# Patient Record
Sex: Female | Born: 1945 | Race: White | Hispanic: No | Smoking: Former smoker
Health system: Southern US, Community
[De-identification: ages and names within clinical notes are randomized; demographics above are authoritative.]

## PROBLEM LIST (undated history)

## (undated) DIAGNOSIS — M1990 Unspecified osteoarthritis, unspecified site: Secondary | ICD-10-CM

## (undated) DIAGNOSIS — E039 Hypothyroidism, unspecified: Secondary | ICD-10-CM

## (undated) DIAGNOSIS — C50919 Malignant neoplasm of unspecified site of unspecified female breast: Secondary | ICD-10-CM

## (undated) DIAGNOSIS — I10 Essential (primary) hypertension: Secondary | ICD-10-CM

## (undated) DIAGNOSIS — E7800 Pure hypercholesterolemia, unspecified: Secondary | ICD-10-CM

## (undated) DIAGNOSIS — C801 Malignant (primary) neoplasm, unspecified: Secondary | ICD-10-CM

## (undated) DIAGNOSIS — E119 Type 2 diabetes mellitus without complications: Secondary | ICD-10-CM

## (undated) DIAGNOSIS — J302 Other seasonal allergic rhinitis: Secondary | ICD-10-CM

## (undated) HISTORY — PX: ABDOMINAL HYSTERECTOMY: SHX81

## (undated) HISTORY — PX: CHOLECYSTECTOMY: SHX55

## (undated) HISTORY — PX: BREAST BIOPSY: SHX20

## (undated) HISTORY — PX: KIDNEY STONE SURGERY: SHX686

---

## 2002-05-20 DIAGNOSIS — C801 Malignant (primary) neoplasm, unspecified: Secondary | ICD-10-CM

## 2002-05-20 HISTORY — DX: Malignant (primary) neoplasm, unspecified: C80.1

## 2002-05-20 HISTORY — PX: BREAST LUMPECTOMY: SHX2

## 2002-05-20 HISTORY — PX: BREAST EXCISIONAL BIOPSY: SUR124

## 2005-06-04 ENCOUNTER — Inpatient Hospital Stay: Payer: Self-pay | Admitting: Psychiatry

## 2005-06-05 ENCOUNTER — Other Ambulatory Visit: Payer: Self-pay

## 2005-06-11 ENCOUNTER — Other Ambulatory Visit: Payer: Self-pay

## 2005-12-02 ENCOUNTER — Emergency Department: Payer: Self-pay | Admitting: Unknown Physician Specialty

## 2006-05-27 ENCOUNTER — Inpatient Hospital Stay: Payer: Self-pay | Admitting: Psychiatry

## 2009-11-23 ENCOUNTER — Inpatient Hospital Stay: Payer: Self-pay | Admitting: Psychiatry

## 2011-06-07 ENCOUNTER — Emergency Department: Payer: Self-pay | Admitting: Emergency Medicine

## 2011-06-07 LAB — CBC WITH DIFFERENTIAL/PLATELET
Basophil #: 0.1 10*3/uL (ref 0.0–0.1)
Eosinophil #: 0.3 10*3/uL (ref 0.0–0.7)
Lymphocyte #: 4 10*3/uL — ABNORMAL HIGH (ref 1.0–3.6)
MCHC: 33.4 g/dL (ref 32.0–36.0)
MCV: 88 fL (ref 80–100)
Monocyte %: 6.9 %
Neutrophil #: 5.9 10*3/uL (ref 1.4–6.5)
Neutrophil %: 53.6 %
Platelet: 304 10*3/uL (ref 150–440)
RBC: 5.01 10*6/uL (ref 3.80–5.20)
RDW: 13 % (ref 11.5–14.5)
WBC: 11 10*3/uL (ref 3.6–11.0)

## 2011-06-07 LAB — BASIC METABOLIC PANEL
Anion Gap: 11 (ref 7–16)
Chloride: 102 mmol/L (ref 98–107)
Co2: 29 mmol/L (ref 21–32)
Creatinine: 1.26 mg/dL (ref 0.60–1.30)
Potassium: 4 mmol/L (ref 3.5–5.1)
Sodium: 142 mmol/L (ref 136–145)

## 2011-06-07 IMAGING — CR DG CHEST 1V PORT
1 series · 1 of 1 positions shown · non-contrast
Comparison: none

REASON FOR EXAM: weak
COMMENTS:

PROCEDURE:     DXR - DXR PORTABLE CHEST SINGLE VIEW  - [DATE]  [DATE]
RESULT:
The lungs are clear. The cardiovascular structures are unremarkable.
Surgical clips are noted over the right lower chest.

[x chest ap]
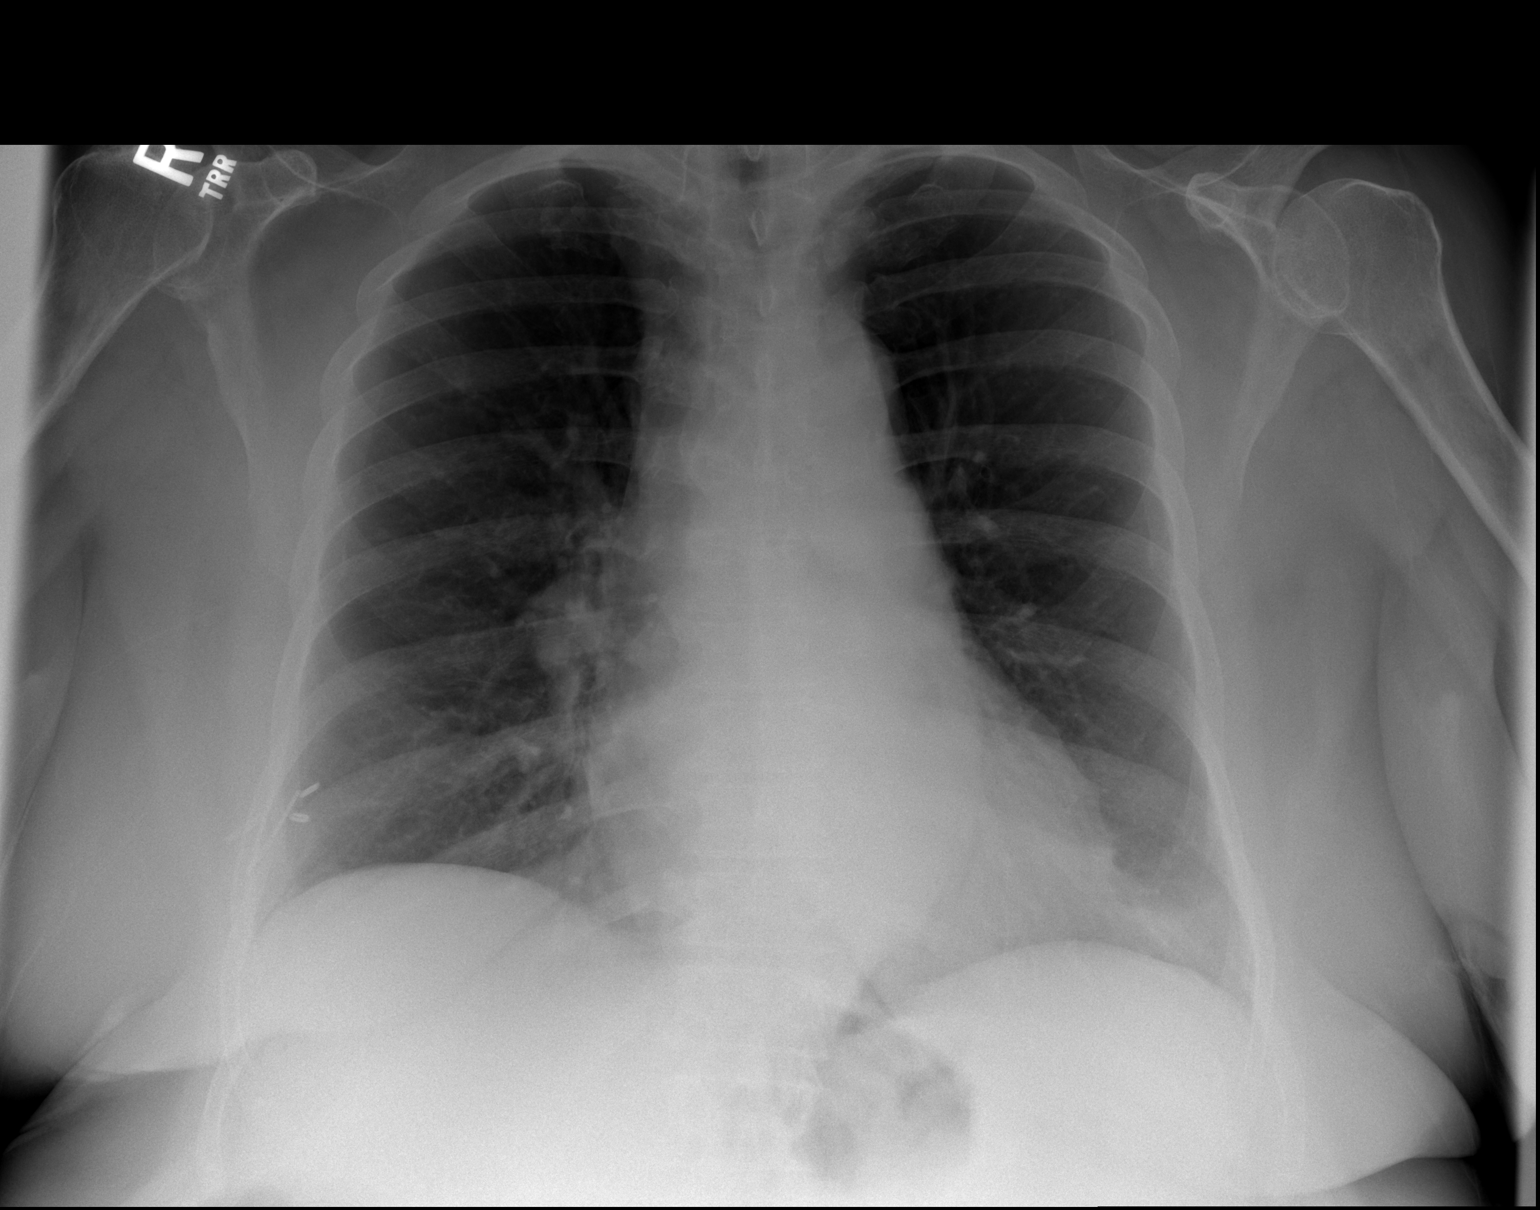

[1 of 1 positions shown; findings below may reference images not displayed]

IMPRESSION: No acute abnormality. Surgical clips are noted over the
right lower chest.

## 2011-06-07 IMAGING — CT CT HEAD WITHOUT CONTRAST
1 series · 16 of 30 positions shown, 20 images · non-contrast
Comparison: none

REASON FOR EXAM: vertigo
COMMENTS:

PROCEDURE:     CT  - CT HEAD WITHOUT CONTRAST  - [DATE] [DATE]
RESULT:
HISTORY: Vertigo.
COMPARISON STUDIES:  None.
PROCEDURE:  Standard nonenhanced CT obtained. No mass lesion. No
hydrocephalus. No hemorrhage. The basilar artery is slightly hyperdense. To
evaluate for basilar artery thrombosis, CTA or MRA should be considered. No
acute bony abnormality.

[Series 2: soft tissue · axial · 0.41mm/px · z∈[+383,+518]mm · 16 of 30 slices shown, 20 images]
[im 2/30  brain]
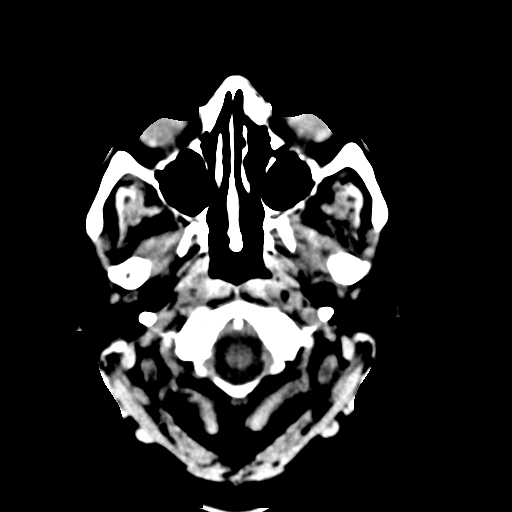
[im 2/30  bone]
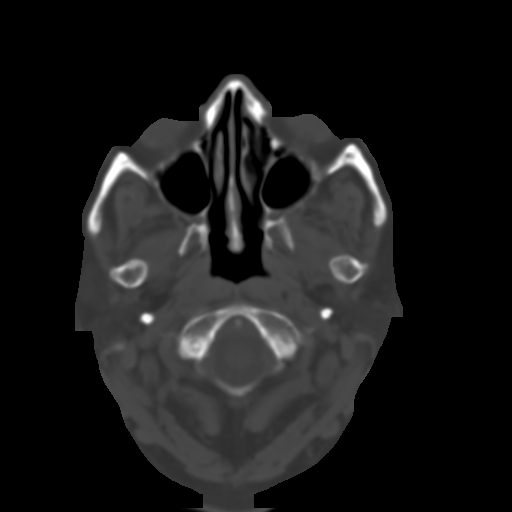
[im 4/30  brain]
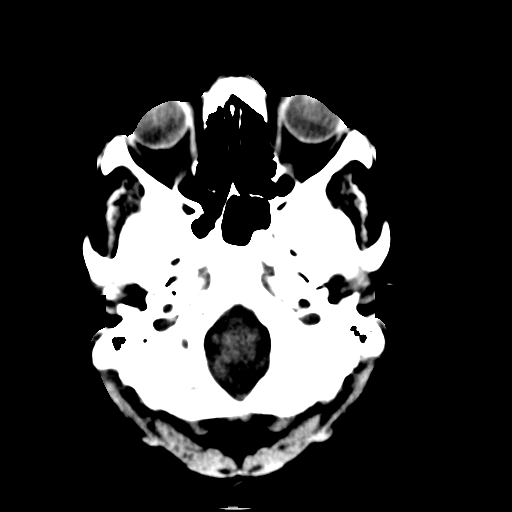
[im 6/30  brain]
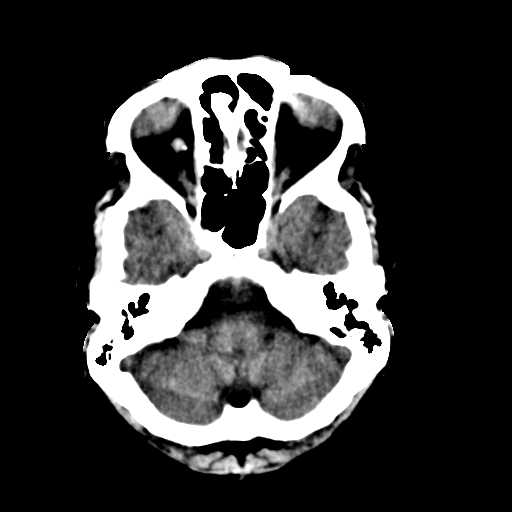
[im 8/30  brain]
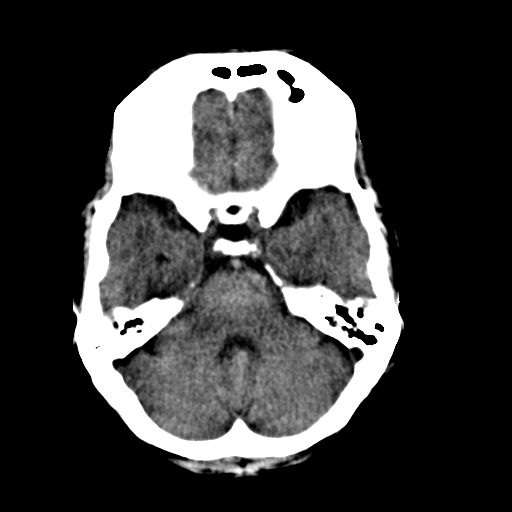
[im 9/30  brain]
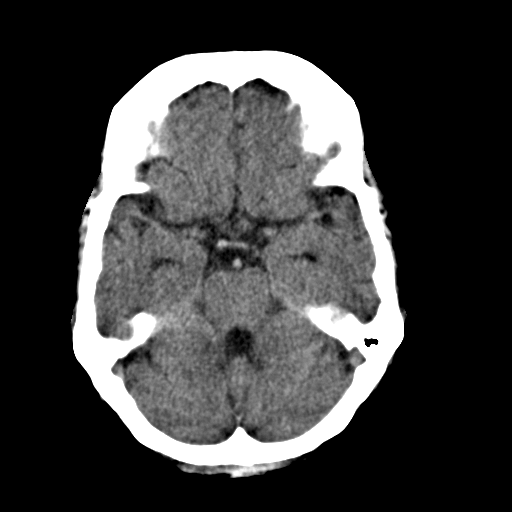
[im 9/30  bone]
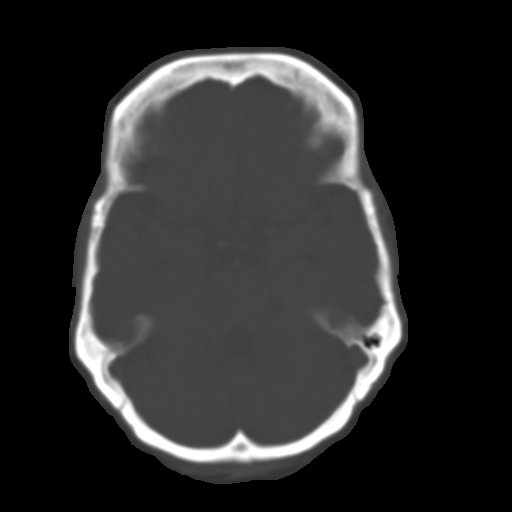
[im 11/30  brain]
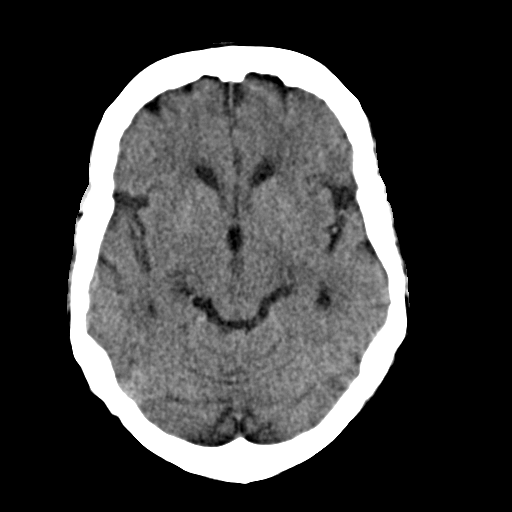
[im 13/30  brain]
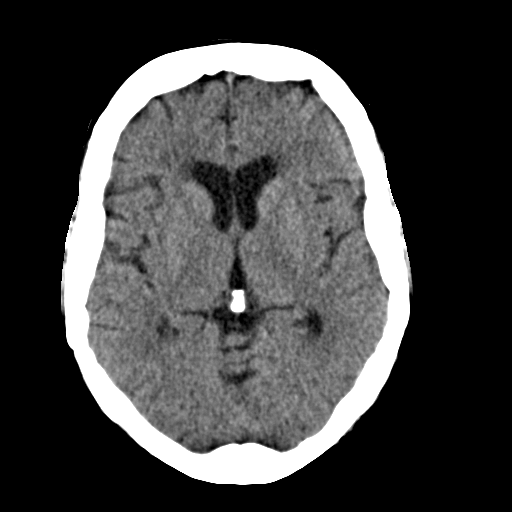
[im 15/30  brain]
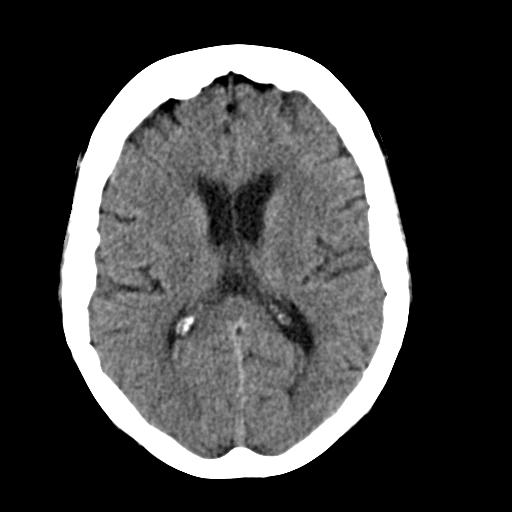
[im 16/30  brain]
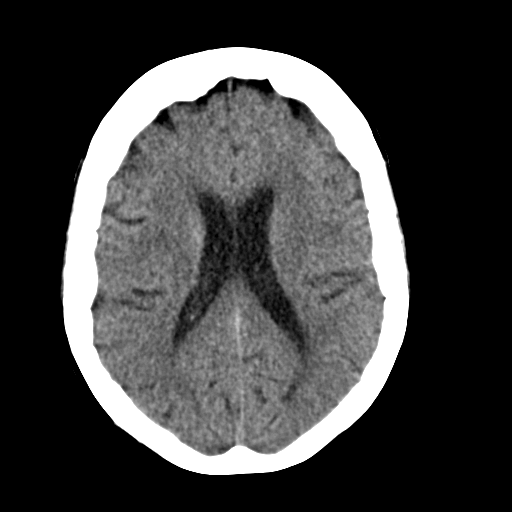
[im 16/30  bone]
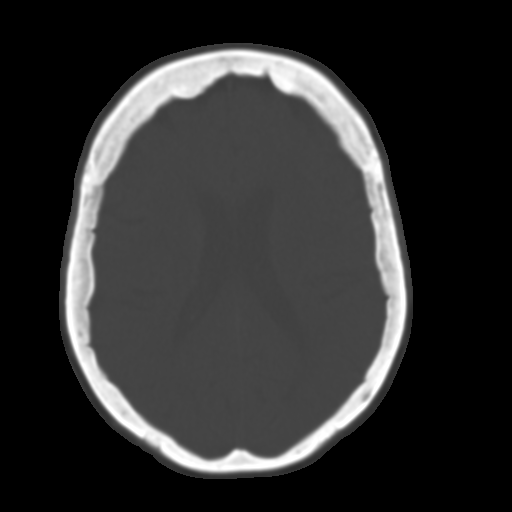
[im 18/30  brain]
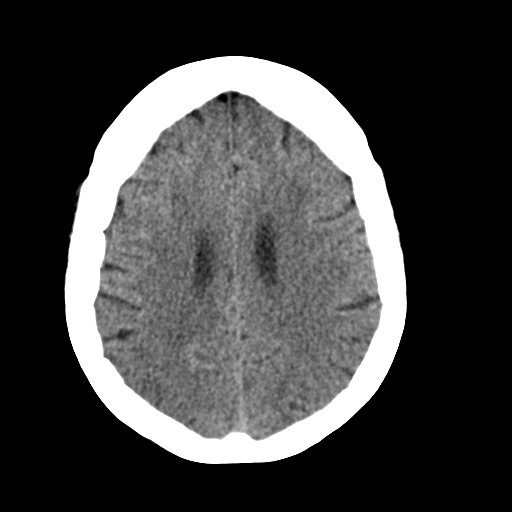
[im 20/30  brain]
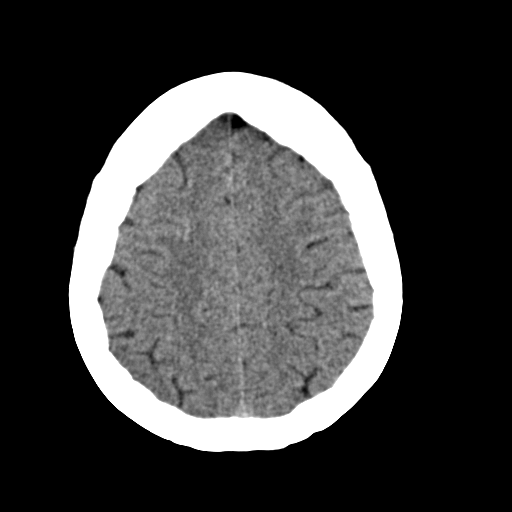
[im 22/30  brain]
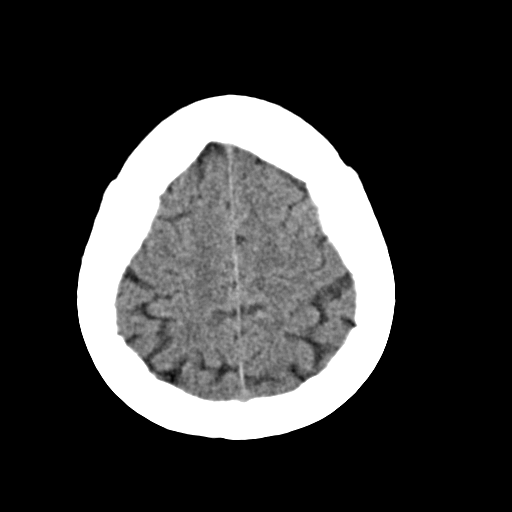
[im 23/30  brain]
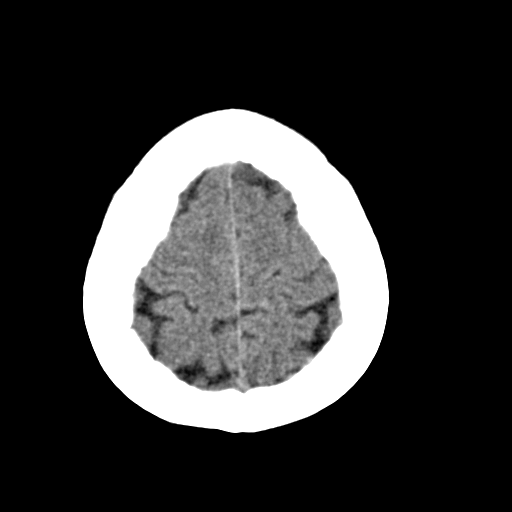
[im 23/30  bone]
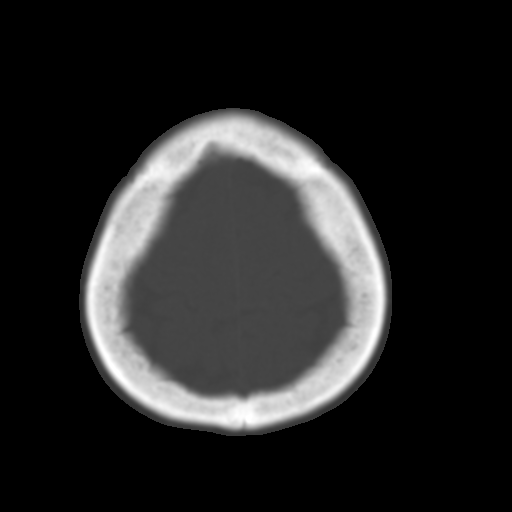
[im 25/30  brain]
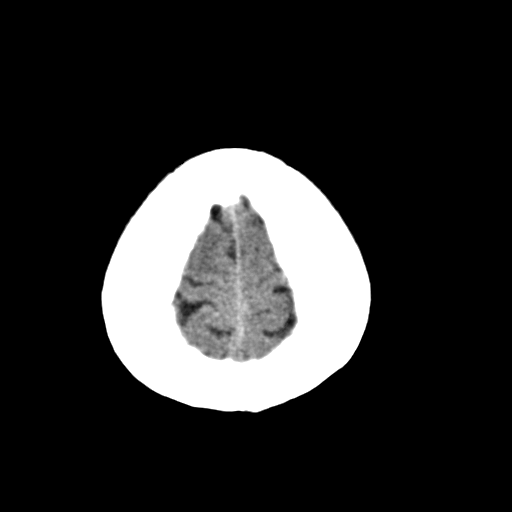
[im 27/30  brain]
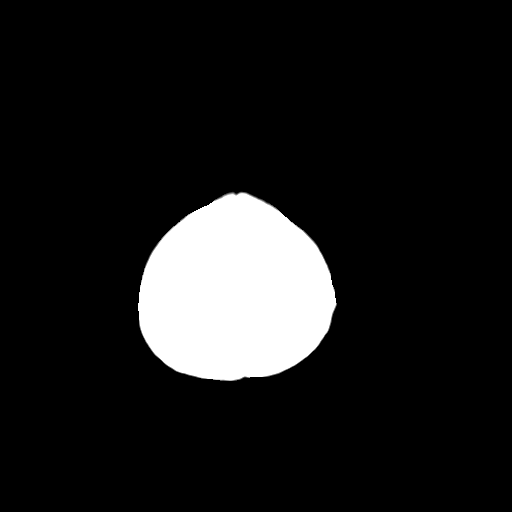
[im 29/30  brain]
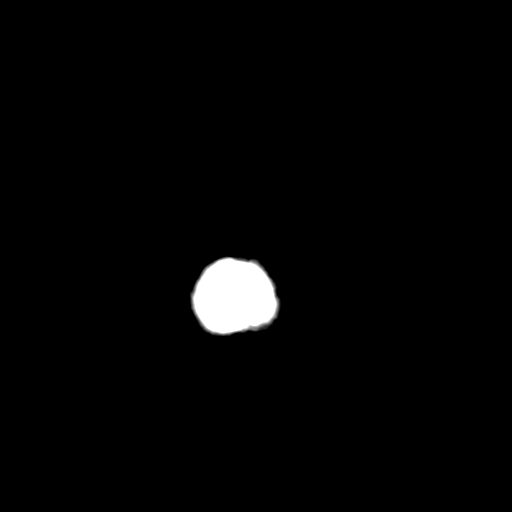

[16 of 30 positions shown; findings below may reference images not displayed]

IMPRESSION: Slightly hyperdense basilar artery as described above. To
evaluate for basilar artery thrombosis, MRA or CTA should be considered for
further evaluation.

This report was communicated to the patient's physician at the time of the
study.

## 2011-06-08 IMAGING — CT CT ANGIOGRAPHY HEAD
1 of 2 series · 20 of 40 positions shown · IV contrast (APPLIED)
Comparison: none

REASON FOR EXAM: ct head read as possible basilar artery thrombosis
COMMENTS:

[Series 4: headangio 3.0 h20f · axial · 0.38mm/px · z∈[+1104,+1263]mm · 20 of 57 slices shown]
[im 2/57  brain]
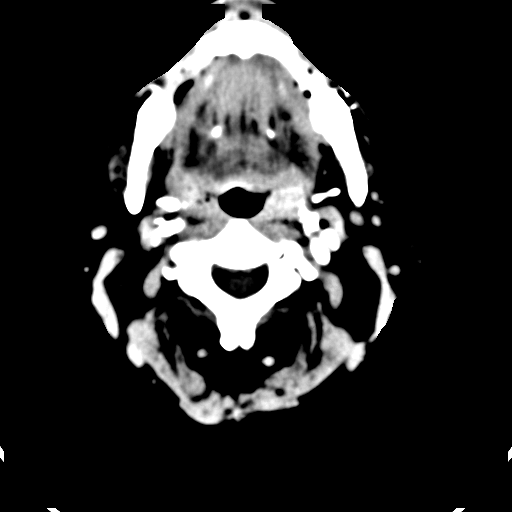
[im 6/57  bone]
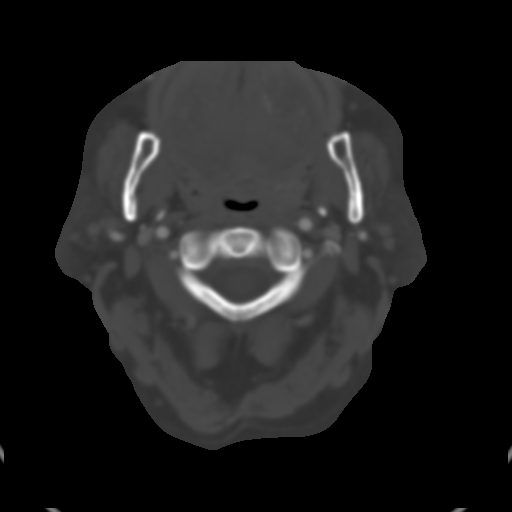
[im 8/57  brain]
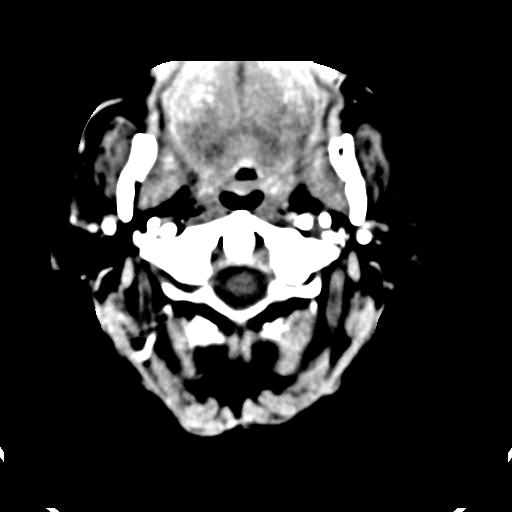
[im 10/57  bone]
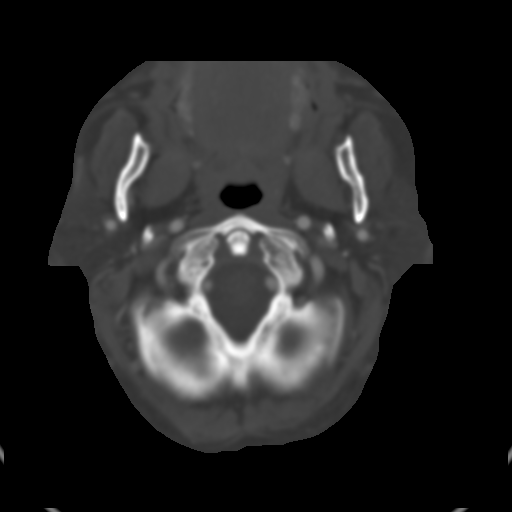
[im 14/57  brain]
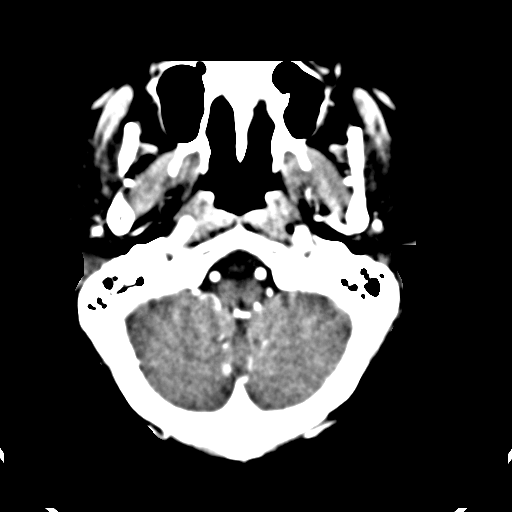
[im 16/57  bone]
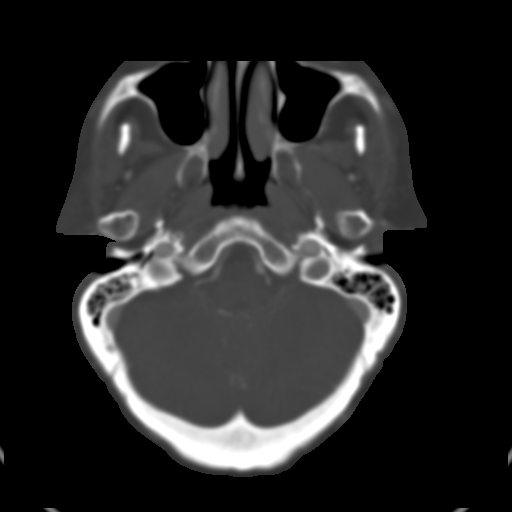
[im 20/57  brain]
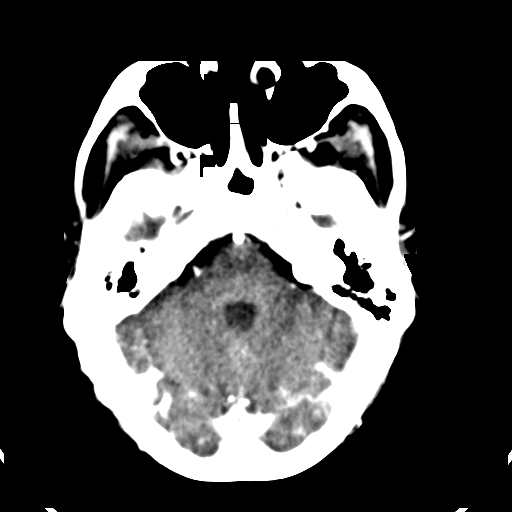
[im 22/57  bone]
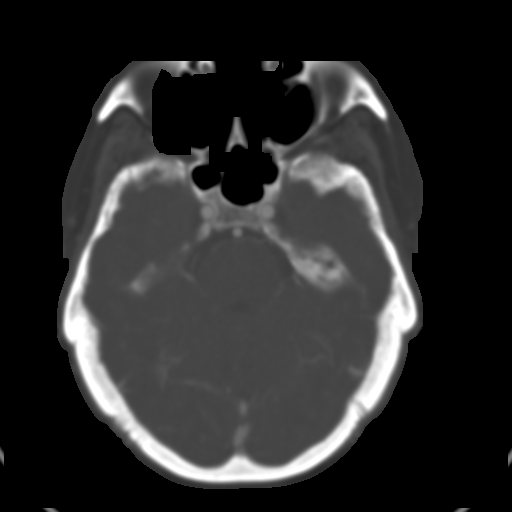
[im 24/57  brain]
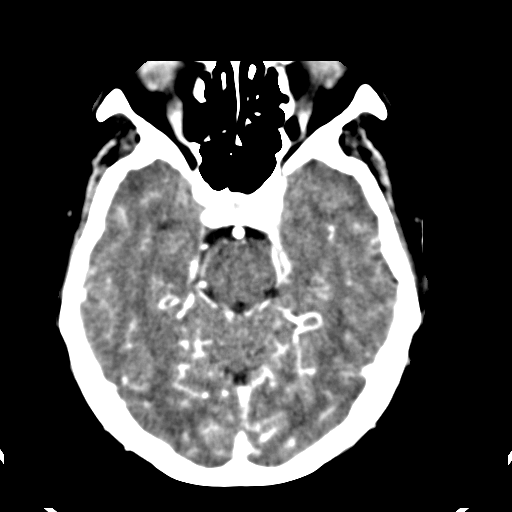
[im 28/57  bone]
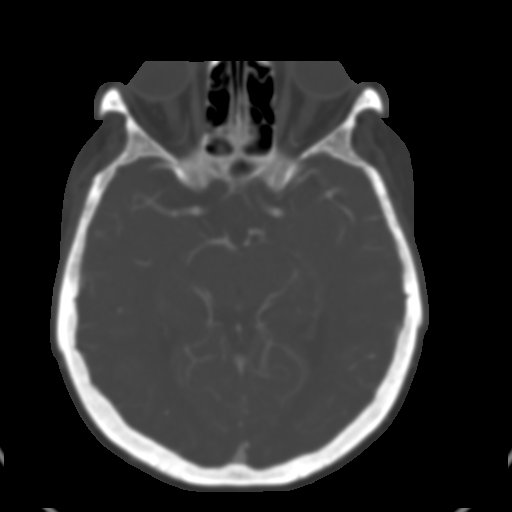
[im 29/57  brain]
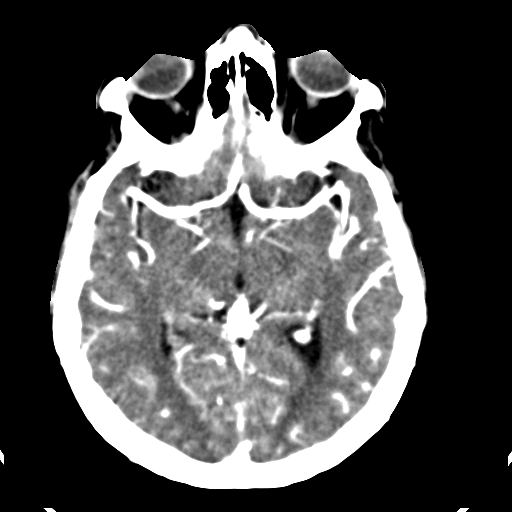
[im 33/57  bone]
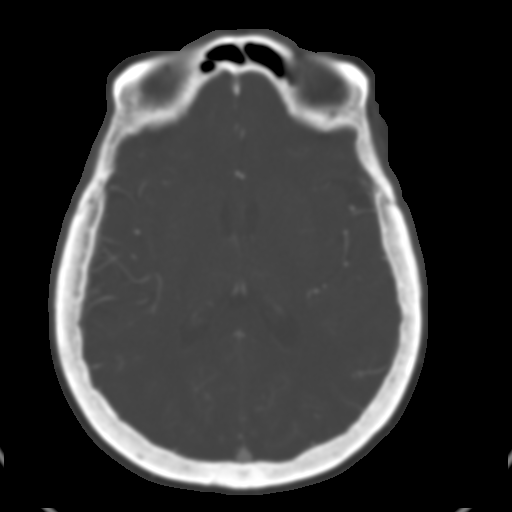
[im 35/57  brain]
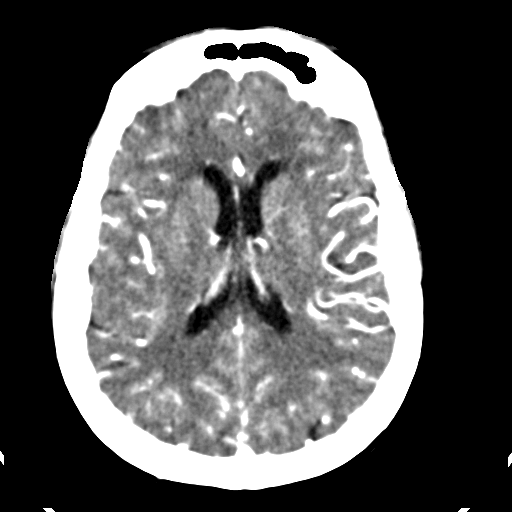
[im 37/57  bone]
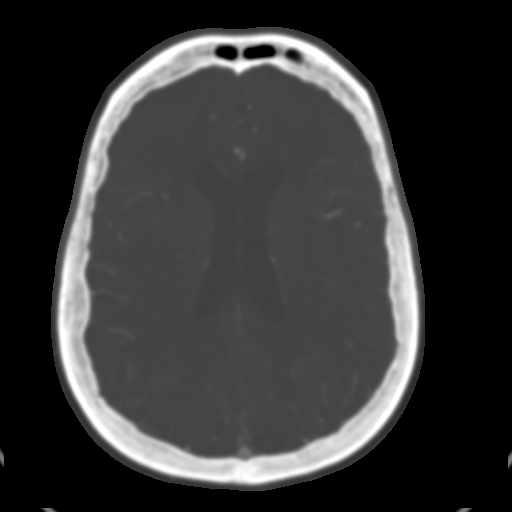
[im 41/57  brain]
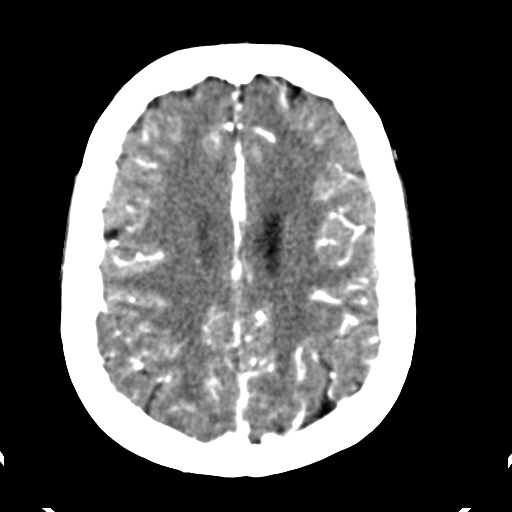
[im 43/57  bone]
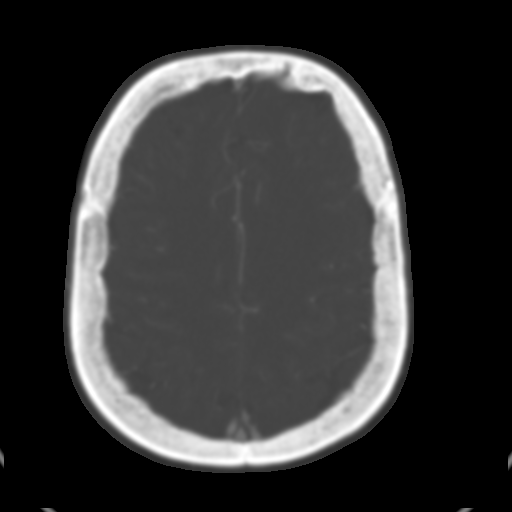
[im 47/57  brain]
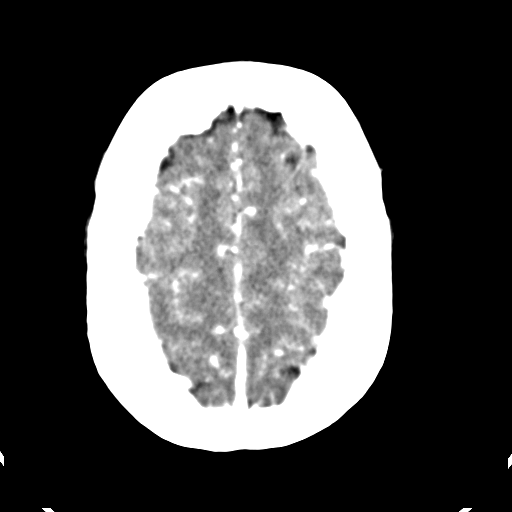
[im 49/57  bone]
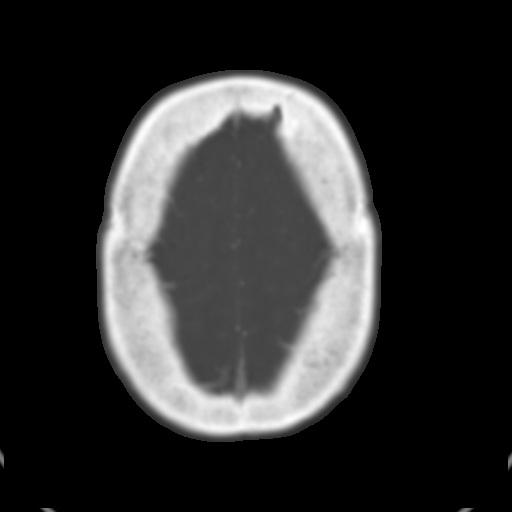
[im 51/57  brain]
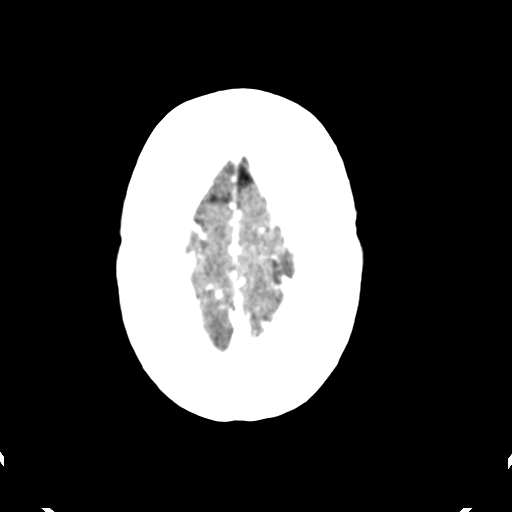
[im 55/57  bone]
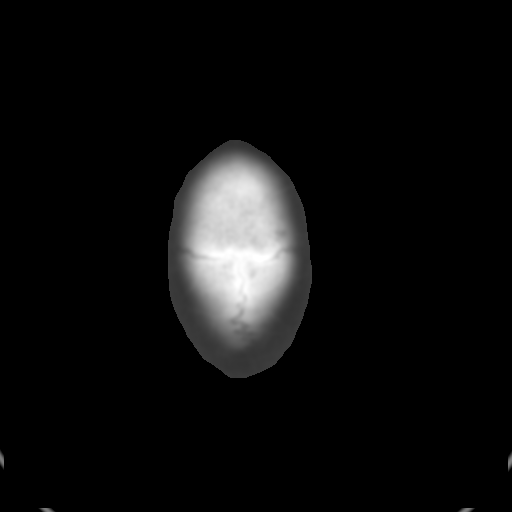

[20 of 40 positions shown; findings below may reference images not displayed]

PROCEDURE:     CT  - CT ANGIOGRAPHY HEAD W/CONTRAST  - [DATE]  [DATE]

RESULT:

PROCEDURE AND FINDINGS:  Standard CTA performed. The exam was performed with
100 ml of [PD]. The anterior and posterior circulation is widely
patent. Specifically, there is no evidence of basilar artery abnormality.
There is no evidence of thrombosis or aneurysm.
IMPRESSION: Normal exam.

## 2012-03-24 ENCOUNTER — Emergency Department: Payer: Self-pay | Admitting: Emergency Medicine

## 2012-03-25 ENCOUNTER — Inpatient Hospital Stay: Payer: Self-pay | Admitting: Psychiatry

## 2012-03-25 DIAGNOSIS — Z79899 Other long term (current) drug therapy: Secondary | ICD-10-CM

## 2012-03-25 LAB — URINALYSIS, COMPLETE
Bilirubin,UR: NEGATIVE
Blood: NEGATIVE
Nitrite: NEGATIVE
Ph: 5 (ref 4.5–8.0)
Protein: NEGATIVE
Squamous Epithelial: 3

## 2012-03-25 LAB — COMPREHENSIVE METABOLIC PANEL
Albumin: 4.1 g/dL (ref 3.4–5.0)
Alkaline Phosphatase: 57 U/L (ref 50–136)
Anion Gap: 10 (ref 7–16)
BUN: 11 mg/dL (ref 7–18)
Co2: 25 mmol/L (ref 21–32)
Creatinine: 0.98 mg/dL (ref 0.60–1.30)
EGFR (African American): >60
SGOT(AST): 11 U/L — ABNORMAL LOW (ref 15–37)
SGPT (ALT): 15 U/L (ref 12–78)

## 2012-03-25 LAB — ACETAMINOPHEN LEVEL: Acetaminophen: <2 ug/mL

## 2012-03-25 LAB — DRUG SCREEN, URINE
Amphetamines, Ur Screen: NEGATIVE (ref ?–1000)
Benzodiazepine, Ur Scrn: NEGATIVE (ref ?–200)
Cannabinoid 50 Ng, Ur ~~LOC~~: NEGATIVE (ref ?–50)
Cocaine Metabolite,Ur ~~LOC~~: NEGATIVE (ref ?–300)
MDMA (Ecstasy)Ur Screen: POSITIVE (ref ?–500)
Tricyclic, Ur Screen: NEGATIVE (ref ?–1000)

## 2012-03-25 LAB — CBC
HCT: 43.1 % (ref 35.0–47.0)
HGB: 14.5 g/dL (ref 12.0–16.0)
MCV: 88 fL (ref 80–100)
RBC: 4.89 10*6/uL (ref 3.80–5.20)

## 2012-03-26 LAB — LIPID PANEL
Ldl Cholesterol, Calc: 69 mg/dL (ref 0–100)
Triglycerides: 161 mg/dL (ref 0–200)

## 2012-04-01 LAB — COMPREHENSIVE METABOLIC PANEL
Albumin: 3.5 g/dL (ref 3.4–5.0)
Alkaline Phosphatase: 48 U/L — ABNORMAL LOW (ref 50–136)
BUN: 17 mg/dL (ref 7–18)
Bilirubin,Total: 0.4 mg/dL (ref 0.2–1.0)
Creatinine: 0.96 mg/dL (ref 0.60–1.30)
EGFR (Non-African Amer.): >60
Glucose: 104 mg/dL — ABNORMAL HIGH (ref 65–99)
Osmolality: 263 (ref 275–301)
SGPT (ALT): 18 U/L (ref 12–78)
Sodium: 130 mmol/L — ABNORMAL LOW (ref 136–145)
Total Protein: 7 g/dL (ref 6.4–8.2)

## 2012-04-01 LAB — OSMOLALITY: Osmolality: 273 mOsm/kg — ABNORMAL LOW (ref 280–301)

## 2012-04-01 LAB — VALPROIC ACID LEVEL: Valproic Acid: 40 ug/mL — ABNORMAL LOW

## 2012-04-02 LAB — BASIC METABOLIC PANEL
Anion Gap: 8 (ref 7–16)
BUN: 10 mg/dL (ref 7–18)
Calcium, Total: 8.7 mg/dL (ref 8.5–10.1)
Chloride: 100 mmol/L (ref 98–107)
Creatinine: 0.87 mg/dL (ref 0.60–1.30)
EGFR (Non-African Amer.): >60
Glucose: 94 mg/dL (ref 65–99)
Osmolality: 263 (ref 275–301)

## 2012-04-03 LAB — BASIC METABOLIC PANEL
Calcium, Total: 9.1 mg/dL (ref 8.5–10.1)
Chloride: 106 mmol/L (ref 98–107)
Co2: 25 mmol/L (ref 21–32)
Creatinine: 0.86 mg/dL (ref 0.60–1.30)
Glucose: 125 mg/dL — ABNORMAL HIGH (ref 65–99)
Osmolality: 277 (ref 275–301)
Sodium: 138 mmol/L (ref 136–145)

## 2012-04-12 LAB — BASIC METABOLIC PANEL
Anion Gap: 8 (ref 7–16)
Chloride: 100 mmol/L (ref 98–107)
Co2: 26 mmol/L (ref 21–32)
Osmolality: 271 (ref 275–301)
Potassium: 4.4 mmol/L (ref 3.5–5.1)
Sodium: 134 mmol/L — ABNORMAL LOW (ref 136–145)

## 2012-04-12 LAB — VALPROIC ACID LEVEL: Valproic Acid: 95 ug/mL

## 2012-04-13 LAB — VALPROIC ACID LEVEL: Valproic Acid: 39 ug/mL — ABNORMAL LOW

## 2013-04-27 ENCOUNTER — Emergency Department: Payer: Self-pay | Admitting: Emergency Medicine

## 2013-04-27 LAB — COMPREHENSIVE METABOLIC PANEL
Alkaline Phosphatase: 55 U/L
BUN: 12 mg/dL (ref 7–18)
Calcium, Total: 10.1 mg/dL (ref 8.5–10.1)
Chloride: 104 mmol/L (ref 98–107)
Co2: 28 mmol/L (ref 21–32)
Creatinine: 0.89 mg/dL (ref 0.60–1.30)
EGFR (Non-African Amer.): >60
Glucose: 133 mg/dL — ABNORMAL HIGH (ref 65–99)
Potassium: 3.6 mmol/L (ref 3.5–5.1)
SGOT(AST): 25 U/L (ref 15–37)
SGPT (ALT): 23 U/L (ref 12–78)
Sodium: 139 mmol/L (ref 136–145)

## 2013-04-27 LAB — DRUG SCREEN, URINE
Amphetamines, Ur Screen: NEGATIVE (ref ?–1000)
Barbiturates, Ur Screen: NEGATIVE (ref ?–200)
Benzodiazepine, Ur Scrn: NEGATIVE (ref ?–200)
Cannabinoid 50 Ng, Ur ~~LOC~~: NEGATIVE (ref ?–50)
Cocaine Metabolite,Ur ~~LOC~~: NEGATIVE (ref ?–300)
MDMA (Ecstasy)Ur Screen: POSITIVE (ref ?–500)
Methadone, Ur Screen: NEGATIVE (ref ?–300)
Opiate, Ur Screen: NEGATIVE (ref ?–300)
Tricyclic, Ur Screen: NEGATIVE (ref ?–1000)

## 2013-04-27 LAB — CBC
HCT: 42.7 % (ref 35.0–47.0)
MCHC: 33.8 g/dL (ref 32.0–36.0)
MCV: 89 fL (ref 80–100)
Platelet: 327 10*3/uL (ref 150–440)
RDW: 12.6 % (ref 11.5–14.5)
WBC: 11 10*3/uL (ref 3.6–11.0)

## 2013-04-27 LAB — ACETAMINOPHEN LEVEL: Acetaminophen: <2 ug/mL

## 2013-04-27 LAB — ETHANOL: Ethanol %: <0.003 % (ref 0.000–0.080)

## 2013-04-27 LAB — URINALYSIS, COMPLETE
Bilirubin,UR: NEGATIVE
Blood: NEGATIVE
Glucose,UR: NEGATIVE mg/dL (ref 0–75)
Ph: 6 (ref 4.5–8.0)
Protein: NEGATIVE
RBC,UR: 1 /HPF (ref 0–5)
WBC UR: 30 /HPF (ref 0–5)

## 2013-05-17 ENCOUNTER — Emergency Department: Payer: Self-pay | Admitting: Emergency Medicine

## 2013-06-23 ENCOUNTER — Emergency Department: Payer: Self-pay | Admitting: Emergency Medicine

## 2013-07-26 ENCOUNTER — Ambulatory Visit: Payer: Self-pay | Admitting: Internal Medicine

## 2014-02-25 ENCOUNTER — Emergency Department: Payer: Self-pay | Admitting: Internal Medicine

## 2014-02-25 LAB — DRUG SCREEN, URINE
Amphetamines, Ur Screen: NEGATIVE (ref ?–1000)
BENZODIAZEPINE, UR SCRN: NEGATIVE (ref ?–200)
Barbiturates, Ur Screen: NEGATIVE (ref ?–200)
CANNABINOID 50 NG, UR ~~LOC~~: NEGATIVE (ref ?–50)
Cocaine Metabolite,Ur ~~LOC~~: NEGATIVE (ref ?–300)
MDMA (ECSTASY) UR SCREEN: POSITIVE (ref ?–500)
Methadone, Ur Screen: NEGATIVE (ref ?–300)
Opiate, Ur Screen: NEGATIVE (ref ?–300)
PHENCYCLIDINE (PCP) UR S: NEGATIVE (ref ?–25)
Tricyclic, Ur Screen: NEGATIVE (ref ?–1000)

## 2014-02-25 LAB — URINALYSIS, COMPLETE
Bilirubin,UR: NEGATIVE
Blood: NEGATIVE
GLUCOSE, UR: NEGATIVE mg/dL (ref 0–75)
Ketone: NEGATIVE
NITRITE: NEGATIVE
PROTEIN: NEGATIVE
Ph: 5 (ref 4.5–8.0)
RBC,UR: 13 /HPF (ref 0–5)
SPECIFIC GRAVITY: 1.018 (ref 1.003–1.030)
Squamous Epithelial: 20

## 2014-02-25 LAB — CBC
HCT: 43.7 % (ref 35.0–47.0)
HGB: 13.9 g/dL (ref 12.0–16.0)
MCH: 29.3 pg (ref 26.0–34.0)
MCHC: 31.9 g/dL — ABNORMAL LOW (ref 32.0–36.0)
MCV: 92 fL (ref 80–100)
Platelet: 327 10*3/uL (ref 150–440)
RBC: 4.76 10*6/uL (ref 3.80–5.20)
RDW: 13.2 % (ref 11.5–14.5)
WBC: 7.6 10*3/uL (ref 3.6–11.0)

## 2014-02-25 LAB — ACETAMINOPHEN LEVEL: Acetaminophen: <2 ug/mL

## 2014-02-25 LAB — COMPREHENSIVE METABOLIC PANEL
ALT: 16 U/L
Albumin: 4 g/dL (ref 3.4–5.0)
Alkaline Phosphatase: 44 U/L — ABNORMAL LOW
Anion Gap: 8 (ref 7–16)
BUN: 13 mg/dL (ref 7–18)
Bilirubin,Total: 0.4 mg/dL (ref 0.2–1.0)
CHLORIDE: 109 mmol/L — AB (ref 98–107)
Calcium, Total: 9.1 mg/dL (ref 8.5–10.1)
Co2: 22 mmol/L (ref 21–32)
Creatinine: 1.1 mg/dL (ref 0.60–1.30)
EGFR (African American): >60
EGFR (Non-African Amer.): 52 — ABNORMAL LOW
GLUCOSE: 135 mg/dL — AB (ref 65–99)
OSMOLALITY: 280 (ref 275–301)
POTASSIUM: 4.4 mmol/L (ref 3.5–5.1)
SGOT(AST): 23 U/L (ref 15–37)
SODIUM: 139 mmol/L (ref 136–145)
Total Protein: 8 g/dL (ref 6.4–8.2)

## 2014-02-25 LAB — ETHANOL: Ethanol: <3 mg/dL

## 2014-02-25 LAB — SALICYLATE LEVEL: Salicylates, Serum: <1.7 mg/dL

## 2014-02-25 LAB — TSH: Thyroid Stimulating Horm: 2.46 u[IU]/mL

## 2014-02-27 LAB — URINE CULTURE

## 2014-07-28 ENCOUNTER — Ambulatory Visit: Payer: Self-pay | Admitting: Family Medicine

## 2014-07-28 IMAGING — MG MM DIGITAL SCREENING BILAT W/ TOMO W/ CAD
8 of 14 series · 8 of 30 positions shown · non-contrast
Comparison: Previous exam(s).

CLINICAL DATA: Screening. History of right breast cancer and
lumpectomy in [OZ]

EXAM:
DIGITAL SCREENING BILATERAL MAMMOGRAM WITH 3D TOMO WITH CAD

[L CV]
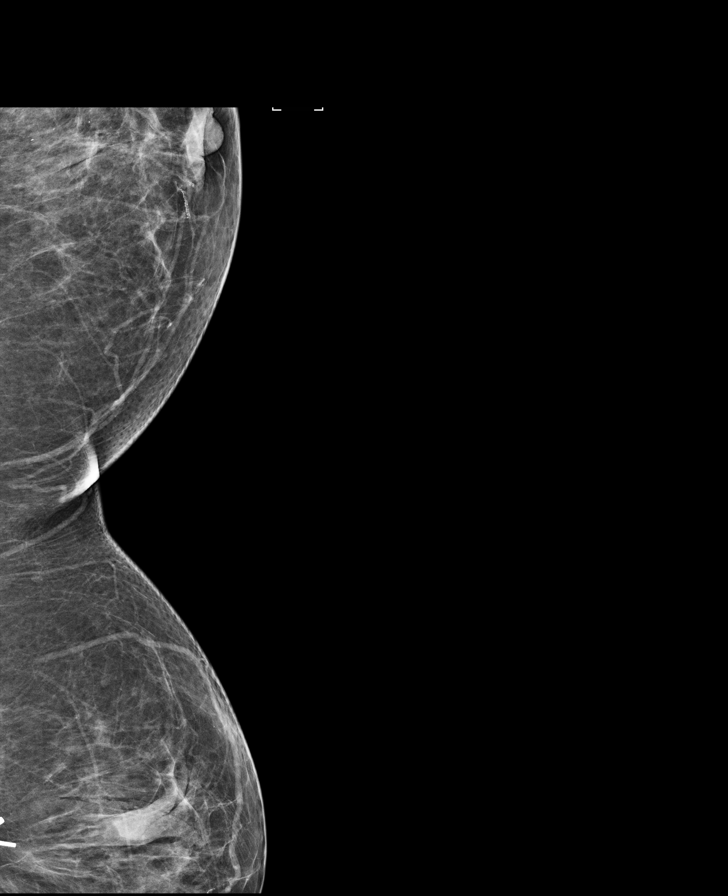

[L MLO (1 of 2)]
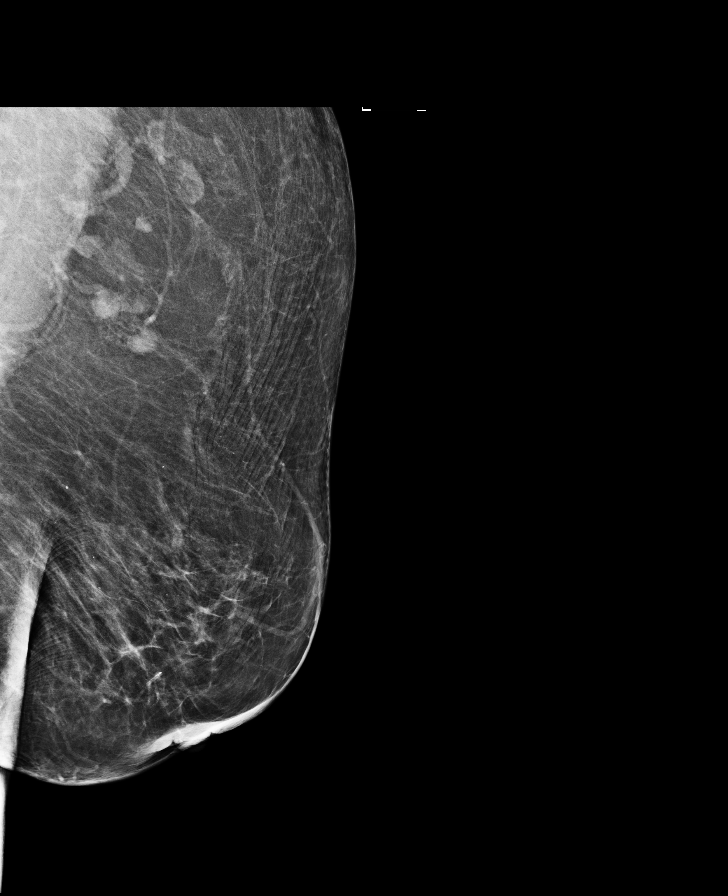

[L CC]
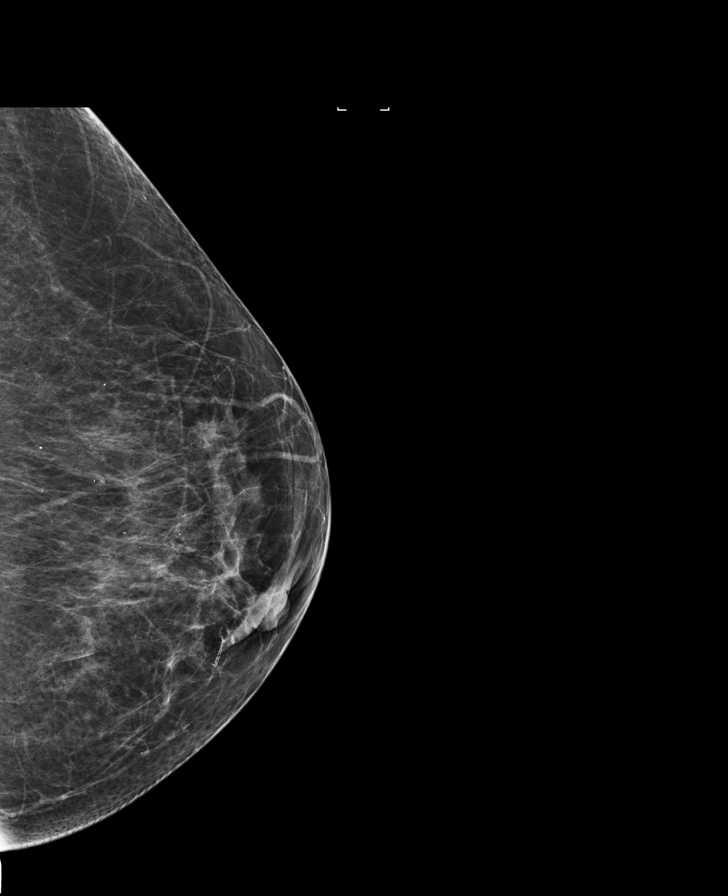

[R MLO]
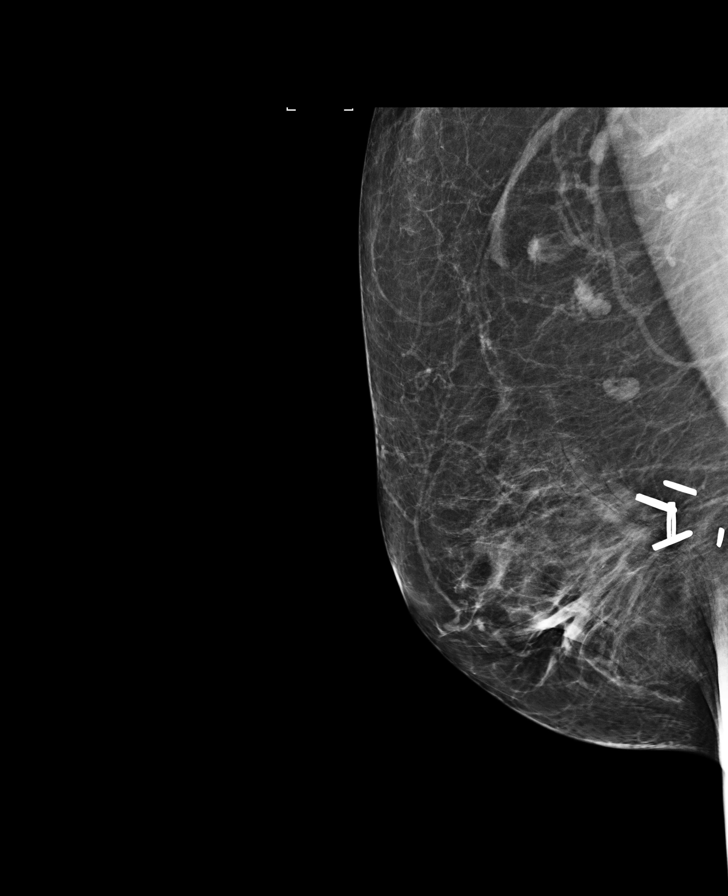

[R CC synth-2D]
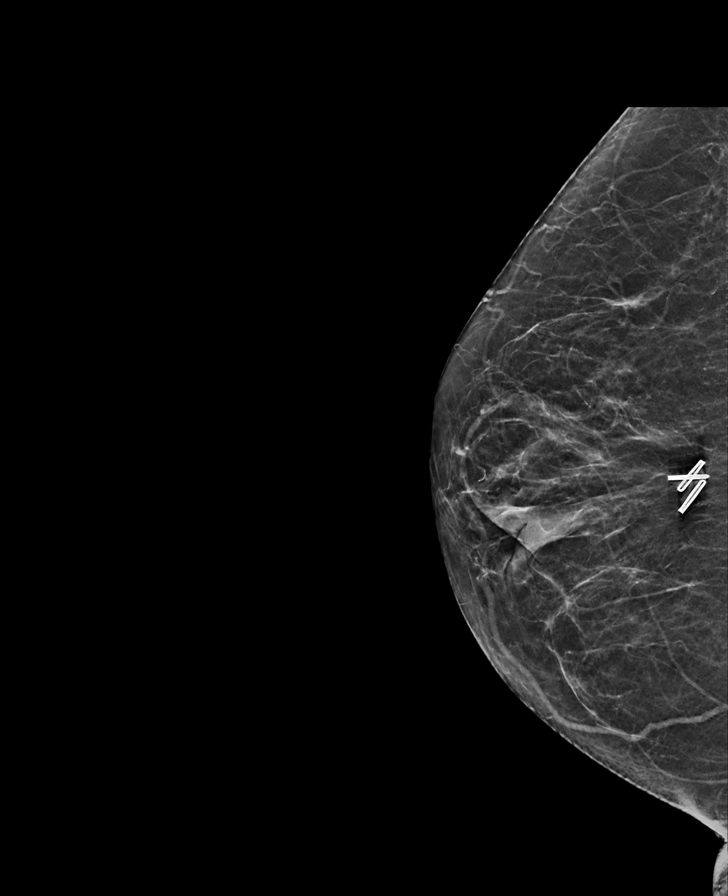

[L MLO (2 of 2)]
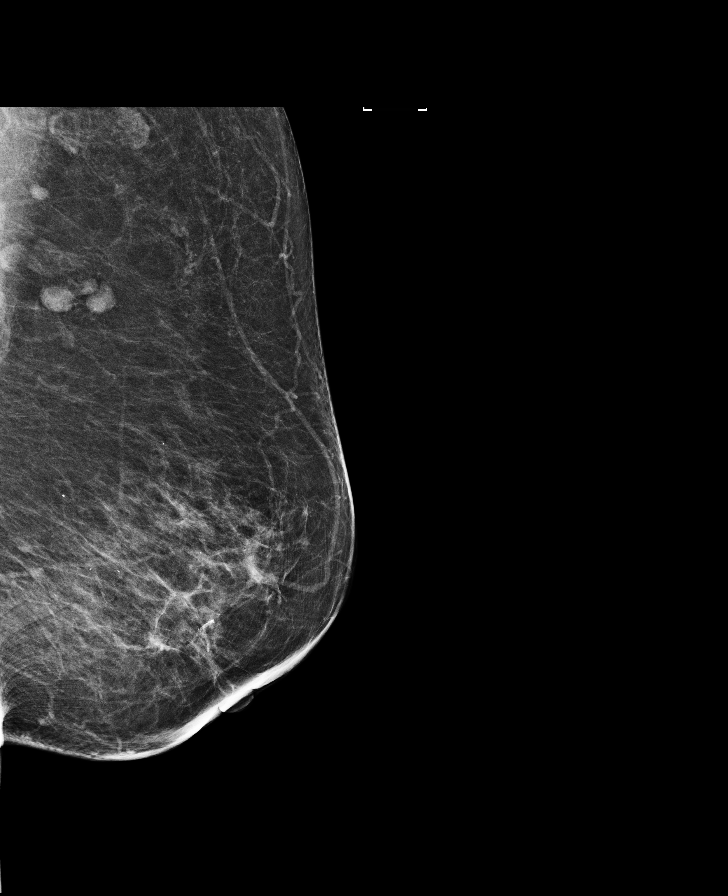

[R MLO synth-2D]
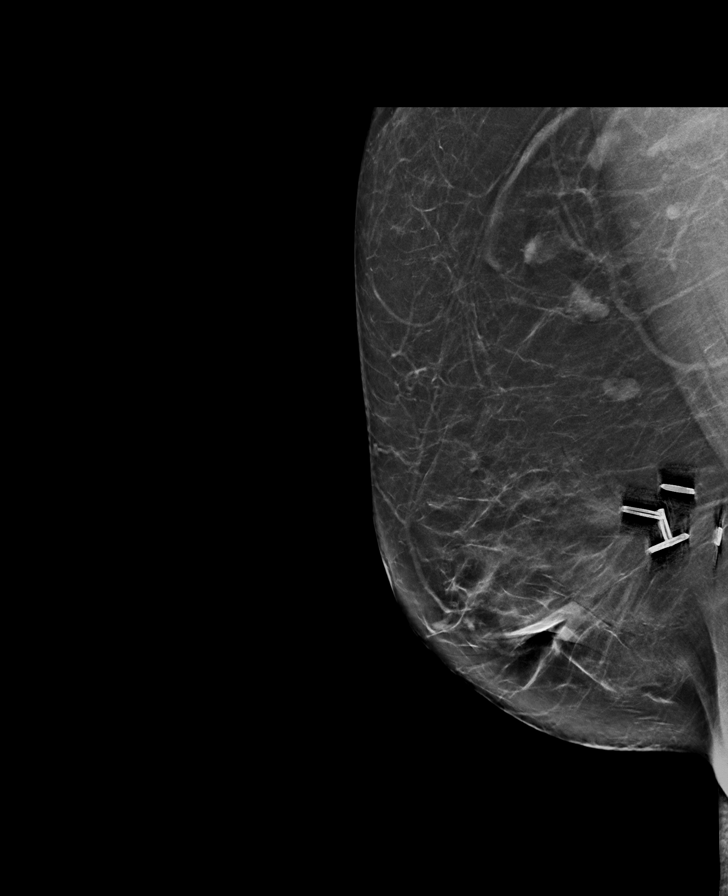

[R CC]
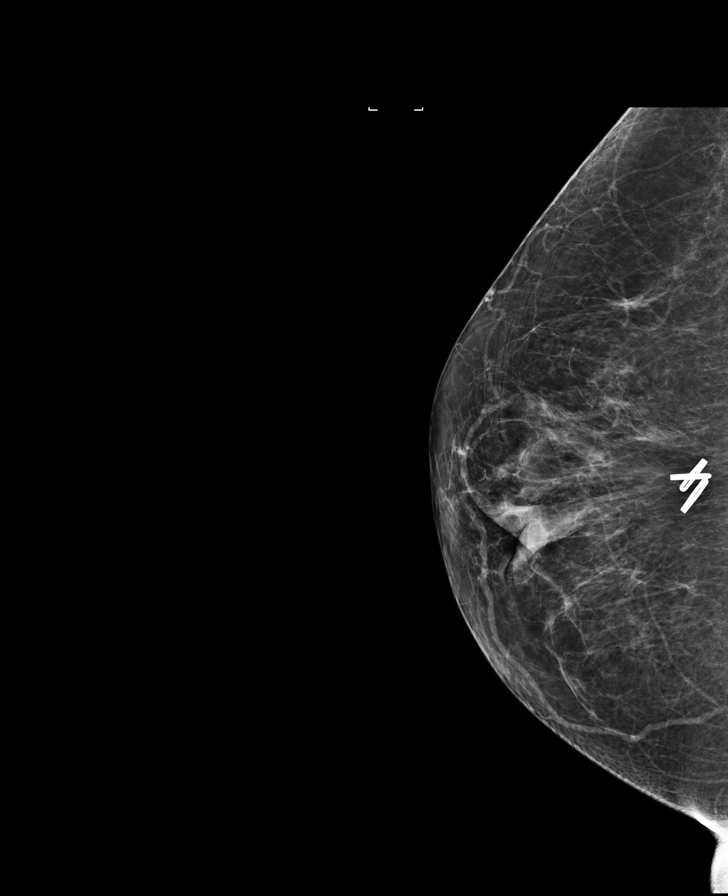

[8 of 30 positions shown; findings below may reference images not displayed]

ACR Breast Density Category b: There are scattered areas of
fibroglandular density.
FINDINGS: There are no findings suspicious for malignancy. Images were
processed with CAD.
IMPRESSION: No mammographic evidence of malignancy. A result letter of this
screening mammogram will be mailed directly to the patient.

RECOMMENDATION:
Screening mammogram in one year. (Code:[OZ])

BI-RADS CATEGORY  1: Negative.

## 2014-08-08 ENCOUNTER — Emergency Department: Payer: Self-pay | Admitting: Emergency Medicine

## 2014-09-06 NOTE — Consult Note (Signed)
PATIENT NAME:  [name], [name] MR#:  [id number] DATE OF BIRTH:  [birth year]  DATE OF CONSULTATION:  04/01/2012  REFERRING PHYSICIAN:  Cephus Shelling, MD CONSULTING PHYSICIAN:  Vivien Presto, MD  REASON FOR CONSULTATION: Hyponatremia.   HISTORY OF PRESENT ILLNESS: The patient is a 69 year old Caucasian female who is currently admitted to [facility] for suicidal thoughts and mood disorder. Medicine consult was asked for sodium of 130. The patient states overall she is doing good. Her diet is so-so. The patient does not like the diet here and has not eaten well, she states. The patient has no positional dizziness, chest pain, or shortness of breath. The patient denies having any dysuria. The patient does have hypothyroidism, but her TSH was checked earlier in this hospitalization and was noted to be within therapeutic range.   PAST MEDICAL HISTORY:  1. Diabetes. 2. Hypertension. 3. Hyperlipidemia. 4. History of cholecystectomy. 5. Hysterectomy. 6. Hypothyroidism. 7. History of partial nephrectomy secondary to stones, per chart.  DRUG ALLERGIES: Penicillin.   OUTPATIENT MEDICATIONS: 1. Aspirin 81 mg daily. 2. Bupropion 150 mg extended-release 1 tab every 24 hours. 3. Cetirizine 10 mg daily. 4. Clonazepam 0.5 mg once a day at bedtime. 5. Enalapril 20 mg twice a day. 6. Metformin 1000 mg twice a day. 7. Simvastatin 20 mg daily.  8. Synthroid 88 mcg daily. 9. Trazodone 100 mg two tabs at bedtime.   CURRENT MEDICATIONS:  1. Depakote 500 mg twice a day with meals.  2. Enalapril 20 mg twice a day. 3. Levothyroxine 88 mcg daily.  4. Multivitamin 1 tab daily.  5. Quetiapine 150 mg at bedtime and 25 mg twice a day.  6. Simvastatin 20 mg daily.  7. Sliding scale insulin. 8. Metformin 1000 mg twice a day.   FAMILY HISTORY: Diabetes in her mom, brother, and sister.   SOCIAL HISTORY: Denies alcohol, tobacco, or drug use.  REVIEW OF SYSTEMS: CONSTITUTIONAL: Denies fever, fatigue,  weakness, or weight changes. EYES: No blurry vision or double vision. ENT: No tinnitus or hearing loss. No vertigo or dizziness. RESPIRATORY: Denies cough, shortness of breath, or wheezing. CARDIOVASCULAR: Denies chest pain or orthopnea. Has history of high blood pressure. GI: Denies nausea, vomiting, or diarrhea. No abdominal pain or black tarry stools. GENITOURINARY: Denies dysuria or frequency. ENDOCRINE: Denies polyuria or nocturia. Has thyroid issues. HEME/LYMPH: Denies anemia or easy bruising. SKIN: Denies any rashes. MUSCULOSKELETAL: Denies arthritis or gout. NEURO: Denies stroke, weakness, or numbness. PSYCH: Positive for mood disorder.   PHYSICAL EXAMINATION:   VITAL SIGNS: Temperature was not recorded and blood pressure last night was 136/81, today 94/61.   GENERAL: The patient is an elderly Caucasian female sitting on the bed, in no obvious distress, talking in full sentences.   HEENT: Normocephalic, atraumatic. Pupils are equal. Extraocular muscles are intact. Anicteric sclerae. Somewhat dry mucous membranes.   NECK: Supple. No thyroid tenderness. No cervical lymphadenopathy.   CARDIOVASCULAR: S1 and S2 regular rate and rhythm. No murmurs, rubs, or gallops.   PULMONARY: Lungs are clear to auscultation without wheezing or rhonchi.   ABDOMEN: Soft, nontender, and nondistended. Positive bowel sounds in all quadrants.   EXTREMITIES: No significant edema.  NEURO: Cranial nerves II through XII grossly intact. Strength is 5/5 in all extremities. Sensation is intact to light touch.   PSYCH: Somewhat confrontational, multiple complaints about her treatment in [facility] [place], but awake, alert, and oriented x3, and cooperative, although reluctantly.  LABS: Glucose is 126, BUN 17, creatinine 0.96, and sodium 130; it  was 139 on 03/25/2012. Chloride is 95 and serum CO2 27. Calculated albumin is 263.   LFTs: Alkaline phosphatase is 48, otherwise within normal limits.  Valproic acid is 40.    ASSESSMENT AND PLAN: We have a 69 year old Caucasian female who is on [place] for suicidal ideation and mood disorder, being managed by Psych, with hypertension, hyperlipidemia, and hypothyroid state with hyponatremia. The patient at this point denies having any symptoms whatsoever. Her main concern appears to be her diet and how she is being treated in [employer]. She states that she does not have any options broadly to eat like other people here. The patient is somewhat dehydrated on exam as well with somewhat dry mucous membranes. At this point, I would check a urine osmolality and serum osmolality, also check orthostatics. Would consider gentle IV fluids given the above. Her TSH from earlier was within therapeutic window. We would check a sodium level in the morning. Otherwise, as the patient is asymptomatic, we would monitor this for now. In regards to her diabetes, I would continue her current regimen. She is not eating well, she states, and her diet is so-so, but sugars are acceptable. I would hold the ACE inhibitor x1 as the patient has persistently low blood pressure and consider some gentle IV fluids. Her TSH is within normal limits and I would continue the current dose of Synthroid for now.        Thank you for the opportunity to see your patient and will follow along with you.   TOTAL TIME SPENT: 55 minutes. ____________________________ Vivien Presto, MD sa:slb D: 04/01/2012 12:07:25 ET T: 04/01/2012 12:30:36 ET JOB#: [id number]  cc: Vivien Presto, MD, <Dictator> Vivien Presto MD ELECTRONICALLY SIGNED 04/30/2012 11:10

## 2014-09-06 NOTE — Discharge Summary (Signed)
PATIENT NAME:  [name], [name] MR#:  [id number] DATE OF BIRTH:  08-09-1945  DATE OF ADMISSION:  03/25/2012 DATE OF DISCHARGE:  04/14/2012  HOSPITAL COURSE: See dictated history and physical for details of admission. This 69 year old woman with a long history of psychiatric illness with multiple previous admissions was brought to the hospital after calling emergency services saying she was suicidal and was going to kill herself. In the hospital, the patient was showing a labile and agitated affect, pressured speech, and angry inappropriately much of the time with disorganized thinking. She appeared to be having a mixed manic episode. She has a past history of multiple severe depressions and eventually did admit that in the more distant past she had had manic episodes, although they had not been happening recently. Her insight into her condition waxed and waned during her hospital stay. At some time she would be compliant with medications and at other points she would abruptly noncompliant and argumentative. She was initially on her antidepressant, Wellbutrin, which she had been on chronically. Gradually, as it became clear that she was having manic-like symptoms, she was transferred over to more mood stabilizing medicines eventually being treated with a combination primarily of Depakote and quetiapine. The patient did not appear to be having significant side effects. Although, she complained of it, she never appeared to be oversedated. She remained paranoid at times about her outpatient treatment team. At no time did she threaten herself or make any suicidal statements nor did she do anything acutely dangerous or violent or threatening. Her blood sugars maintained stability throughout her hospital stay and there were no other new medical issues. Gradually we reached an accord on her medication of her agreeing to take the Depakote 500 mg twice a day. This has given her an at least adequate blood level. She has  improved her affect to be more calm and able to interact appropriately with others. She is able to hold a fairly lucid conversation without getting paranoid. She totally denies any threats and denies any suicidal ideation. She is agreeable to follow-up but will only follow up with outpatient providers, other than seeing Dr. Sammuel Cooper at [facility]. We have arranged for her to be seeing [facility] instead.   LABORATORY RESULTS: Admission labs showed a chemistry panel with an elevated glucose at 124 and AST low at 11, otherwise normal. Alcohol undetected. CBC normal. Salicylates normal at 2.5. TSH normal at 1.1. Vitamin B12 level slightly low at 180. CBC showed a normal sinus rhythm, possible septal infarct. Drug screen positive for MDMA. Urinalysis positive for leukocyte esterase, positive white blood cells. For the rest of her hospital stay, her blood sugars remained in the mid to low 100s. Lipids were checked and showed a cholesterol of 149, triglycerides 161, HDL 48, and LDL 69, all within normal limits. Hemoglobin A1c was only slightly elevated at 6.7.   DISCHARGE MEDICATIONS:  1. Glucophage 1000 mg twice a day.  2. Zocor 20 mg at bedtime.  3. Synthroid 88 mcg in the morning.  4. Quetiapine 25 mg at 9:00 and 5:00. 5. Vasotec 20 mg twice a day.  6. Seroquel 600 mg at night. 7. Depakote 500 mg twice a day.   MENTAL STATUS EXAM AT DISCHARGE: Casually dressed, adequately groomed woman who looks her stated age. Cooperative with the interview. Eye contact good. Psychomotor activity normal. Speech normal in rate, tone, and volume. Affect reactive and at times still a little bit grumpy but not hostile or threatening. Thoughts were able to  be easily contained and were not grossly paranoid or agitated. No sign of delusions. Denies hallucinations. Denies suicidal or homicidal ideation. Improved insight and judgment. Good short-term and longer term memory. Baseline intelligence normal.   DIAGNOSIS PRINCIPLE AND PRIMARY:    AXIS I: Bipolar disorder, most recent episode manic.   SECONDARY DIAGNOSES:   AXIS I: No further diagnosis.   AXIS II: Deferred.   AXIS III: Diabetes, history of hyperlipidemia, history of hypothyroidism, and acute urinary tract infection which was treated in the hospital.         AXIS IV: Severe from social isolation.   AXIS V: Functioning at time of discharge 55. ____________________________ Gonzella Lex, MD jtc:slb D: 04/14/2012 15:56:07 ET T: 04/15/2012 10:39:12 ET JOB#: [id number]  cc: Gonzella Lex, MD, <Dictator> Gonzella Lex MD ELECTRONICALLY SIGNED 04/19/2012 21:36
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
## 2014-09-10 NOTE — Consult Note (Signed)
PATIENT NAME:  [name], HOLLAR MR#:  [id number] DATE OF BIRTH:  Dec 11, 1945  DATE OF CONSULTATION:  02/25/2014  REFERRING PHYSICIAN:   CONSULTING PHYSICIAN:  Gonzella Lex, MD  IDENTIFYING INFORMATION AND REASON FOR CONSULT: This is a 69 year old woman with a history of bipolar disorder and personality disorder who came into the Emergency Room voluntarily.   CHIEF COMPLAINT: "I just wanted to talk."   HISTORY OF PRESENT ILLNESS: This 69 year old woman has a history of personality disorder with paranoid and borderline features, also however has appeared to be truly bipolar in the past. She came into the Emergency Room today and reported to the intake nurse that all she really wanted to do was talk and that she did not think there was anything wrong. She is somewhat difficult to interview because she insists on dominating the conversation and talking about her own issues rather than answering most questions. She tells me that her mood recently has been about the same as usual. She does seem to have a fairly new set of symptoms or at least more acute complaint of believing that items she wants to purchase at the grocery store are being intentionally moved so that she cannot buy them on the days that she go shopping. She understands that this sounds paranoid and psychotic and says she does not care, she still believes that people do these kinds of things to her. Her sleep has been about the same as usual. She has been compliant with her medicine prescribed by [facility] and has gone to see her outpatient providers. Denies any substance abuse. She is not reporting any suicidal or homicidal ideation. Denies that she is having hallucinations. She has long stories about the horrible in her opinion relationships she has had with her family, especially her sister, but none of it is acute, most of them are things that happened years ago.   PAST PSYCHIATRIC HISTORY:  Miss Drue Flirt has had several admissions to the hospital  and visits to the Emergency Room. The last time she came through she was sent to a geriatric unit. She has been diagnosed with both personality disorder and bipolar disorder in the past. I have tried to in the past get her to take mood stabilizing medicines. She is of the opinion that she does best when just taking trazodone, Klonopin, and Wellbutrin, which is what she is taking now. Denies that she has ever tried to kill herself or been violent to others in the past.   SUBSTANCE ABUSE HISTORY: Denies alcohol or drug abuse or any past substance abuse.   SOCIAL HISTORY: The patient lives by herself. Her social interaction seems to be pretty minimal. She has a lot of difficulty getting along with other people including her family. She does not feel like her current situation however is really stressful.   PAST MEDICAL HISTORY: She has high blood pressure, diabetes, dyslipidemia, all of which she takes medication for.   CURRENT MEDICATIONS: Levothyroxine 75 mcg a day, bupropion 150 mg once a day, simvastatin 20 mg at night, loperamide 2 mg every 4 hours as needed for diarrhea, metformin 1000 mg twice a day, trazodone 150 mg at night, clonazepam 1 mg at night, enalapril 20 mg twice a day, aspirin 81 mg a day.   ALLERGIES:  PENICILLIN.   REVIEW OF SYSTEMS: Does not complain of really any clear specific mood symptoms. Stays anxious a fair bit of the time, but justifies all of it. Denies suicidal or homicidal ideation. Denies hallucinations.  Physically she does not have one specific complaint that she focuses on, most of the review of systems is negative.   MENTAL STATUS EXAMINATION: Elderly woman, looks her stated age, somewhat disheveled, does not look like she does a great job taking care of herself. She participates in the interview although it is hard to call it cooperative since she turns every question into an opportunity to discuss things that have happened to her in the past. Ultimately she is able  however to give some history. Eye contact good. Psychomotor activity fidgety. Speech is at times slightly pressured, not loud. Affect is irritable. Mood is stated as being okay. Thoughts are tending to perseverate on negative issues in the past. Somewhat paranoid. Judgment and insight adequate. Short-term memory intact, 3 out of 3 objects immediately and at 3 minutes. Long-term memory intact. Normal fund of knowledge.   LABORATORY RESULTS: Drug screen is positive for MDMA, which is probably her Wellbutrin. Urinalysis, high levels of bacteria, high levels of white blood cells and leukocyte esterase. CBC normal. Chemistry panel, elevated chloride, low alkaline phosphatase, elevated glucose.   PHYSICAL EXAMINATION: Able to ambulate without difficulty. Does not appear dizzy. Uses all 4 extremities. Cranial nerves symmetric.   VITAL SIGNS: Blood pressure 139/86, respirations 18, pulse 89, temperature 98.3.   ASSESSMENT: A 69 year old woman with a history of bipolar disorder who is slightly agitated right now, but able to control it. She has paranoid beliefs, but has insight that people think they are paranoid and is not acting on them. Does not appear to be acutely dangerous to herself or others. Hard to distinguish in her how much is personality disorder, how much to see is bipolar disorder. At this point however does not appear to meet commitment criteria and has no interest in being in the hospital. The patient can be discharged safely.   TREATMENT PLAN: The case discussed with the Emergency Room attending. Continue current medication. Psychoeducation about her illness and the importance of getting good sleep, of staying emotionally stable, and following up with [facility] done. She agrees to all of this.   DIAGNOSIS PRINCIPAL AND PRIMARY:   AXIS I: Bipolar disorder type II, currently hypomanic to stable.   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   AXIS III:   1.  Dyslipidemia.   2.  Diabetes.   3.  High blood pressure.   4.  Hypothyroidism.     ____________________________ Gonzella Lex, MD jtc:bu D: 02/25/2014 17:00:16 ET T: 02/25/2014 17:41:30 ET JOB#: [id number]  cc: Gonzella Lex, MD, <Dictator> Gonzella Lex MD ELECTRONICALLY SIGNED 02/28/2014 0:23

## 2014-09-18 ENCOUNTER — Emergency Department
Admission: EM | Admit: 2014-09-18 | Discharge: 2014-09-20 | Disposition: A | Discharge status: Other Acute Inpatient Hospital | Payer: Medicare Other | Source: Home / Self Care | Attending: Emergency Medicine | Admitting: Emergency Medicine

## 2014-09-18 ENCOUNTER — Encounter: Payer: Self-pay | Admitting: Emergency Medicine

## 2014-09-18 DIAGNOSIS — F3113 Bipolar disorder, current episode manic without psychotic features, severe: Secondary | ICD-10-CM | POA: Diagnosis present

## 2014-09-18 DIAGNOSIS — F3162 Bipolar disorder, current episode mixed, moderate: Secondary | ICD-10-CM | POA: Insufficient documentation

## 2014-09-18 DIAGNOSIS — F22 Delusional disorders: Secondary | ICD-10-CM

## 2014-09-18 DIAGNOSIS — F3160 Bipolar disorder, current episode mixed, unspecified: Secondary | ICD-10-CM | POA: Diagnosis not present

## 2014-09-18 HISTORY — DX: Other seasonal allergic rhinitis: J30.2

## 2014-09-18 HISTORY — DX: Pure hypercholesterolemia, unspecified: E78.00

## 2014-09-18 HISTORY — DX: Type 2 diabetes mellitus without complications: E11.9

## 2014-09-18 HISTORY — DX: Hypothyroidism, unspecified: E03.9

## 2014-09-18 HISTORY — DX: Essential (primary) hypertension: I10

## 2014-09-18 HISTORY — DX: Unspecified osteoarthritis, unspecified site: M19.90

## 2014-09-18 LAB — COMPREHENSIVE METABOLIC PANEL
ALT: 10 U/L — ABNORMAL LOW (ref 14–54)
ANION GAP: 9 (ref 5–15)
AST: 20 U/L (ref 15–41)
Albumin: 4 g/dL (ref 3.5–5.0)
Alkaline Phosphatase: 35 U/L — ABNORMAL LOW (ref 38–126)
BUN: 26 mg/dL — AB (ref 6–20)
CO2: 24 mmol/L (ref 22–32)
Calcium: 9.8 mg/dL (ref 8.9–10.3)
Chloride: 106 mmol/L (ref 101–111)
Creatinine, Ser: 1.03 mg/dL — ABNORMAL HIGH (ref 0.44–1.00)
GFR, EST NON AFRICAN AMERICAN: 54 mL/min — AB (ref >60)
GLUCOSE: 109 mg/dL — AB (ref 65–99)
Potassium: 4.4 mmol/L (ref 3.5–5.1)
Sodium: 139 mmol/L (ref 135–145)
Total Bilirubin: 0.4 mg/dL (ref 0.3–1.2)
Total Protein: 6.8 g/dL (ref 6.5–8.1)

## 2014-09-18 LAB — URINE DRUG SCREEN, QUALITATIVE (ARMC ONLY)
Amphetamines, Ur Screen: NOT DETECTED
BENZODIAZEPINE, UR SCRN: NOT DETECTED
Barbiturates, Ur Screen: NOT DETECTED
COCAINE METABOLITE, UR ~~LOC~~: NOT DETECTED
Cannabinoid 50 Ng, Ur ~~LOC~~: NOT DETECTED
MDMA (Ecstasy)Ur Screen: NOT DETECTED
Methadone Scn, Ur: NOT DETECTED
OPIATE, UR SCREEN: NOT DETECTED
Phencyclidine (PCP) Ur S: NOT DETECTED
TRICYCLIC, UR SCREEN: NOT DETECTED

## 2014-09-18 LAB — CBC
HEMATOCRIT: 39.6 % (ref 35.0–47.0)
HEMOGLOBIN: 12.9 g/dL (ref 12.0–16.0)
MCH: 28.8 pg (ref 26.0–34.0)
MCHC: 32.5 g/dL (ref 32.0–36.0)
MCV: 88.7 fL (ref 80.0–100.0)
Platelets: 234 10*3/uL (ref 150–440)
RBC: 4.46 MIL/uL (ref 3.80–5.20)
RDW: 13.2 % (ref 11.5–14.5)
WBC: 9.1 10*3/uL (ref 3.6–11.0)

## 2014-09-18 LAB — ACETAMINOPHEN LEVEL: Acetaminophen (Tylenol), Serum: <10 ug/mL — ABNORMAL LOW (ref 10–30)

## 2014-09-18 MED ORDER — CLONAZEPAM 1 MG PO TABS
ORAL_TABLET | ORAL | Status: AC
  Administered 2014-09-18: 1 mg
  Filled 2014-09-18: qty 1

## 2014-09-18 MED ORDER — METFORMIN HCL 500 MG PO TABS
1000.0000 mg | ORAL_TABLET | Freq: Two times a day (BID) | ORAL | Status: DC
  Administered 2014-09-18 – 2014-09-20 (×3): 1000 mg via ORAL
  Filled 2014-09-18 (×2): qty 2

## 2014-09-18 MED ORDER — TRAZODONE HCL 100 MG PO TABS: 100.0000 mg | ORAL_TABLET | Freq: Every day | ORAL | Status: DC

## 2014-09-18 MED ORDER — CLONAZEPAM 1 MG PO TABS: 1.0000 mg | ORAL_TABLET | Freq: Every day | ORAL | Status: DC

## 2014-09-18 MED ORDER — TRAZODONE HCL 100 MG PO TABS
ORAL_TABLET | ORAL | Status: AC
  Administered 2014-09-18: 100 mg
  Filled 2014-09-18: qty 1

## 2014-09-18 MED ORDER — METFORMIN HCL 500 MG PO TABS
ORAL_TABLET | ORAL | Status: AC
  Administered 2014-09-18: 1000 mg via ORAL
  Filled 2014-09-18: qty 2

## 2014-09-18 MED ORDER — ENALAPRIL MALEATE 10 MG PO TABS
10.0000 mg | ORAL_TABLET | Freq: Two times a day (BID) | ORAL | Status: DC
  Administered 2014-09-18 – 2014-09-20 (×3): 10 mg via ORAL
  Filled 2014-09-18 (×6): qty 1

## 2014-09-18 NOTE — ED Notes (Signed)
Pt transferred to [place].

## 2014-09-18 NOTE — ED Provider Notes (Signed)
[facility] Emergency Department Provider Note    ____________________________________________  Time seen: 10:15 AM  I have reviewed the triage vital signs and the nursing notes.   HISTORY  Chief Complaint Psychiatric Evaluation   Patient presents because she feels like she is losing her mind and acting paranoid.    HPI [name] is a 69 y.o. female who reports having a history of psychiatric disease and prior paranoia. 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  Patient has been attending [facility] group therapy recently for same and is under psychiatric care through [facility], however reports her symptoms are worsening and she feels very paranoid and is concerned that she is having a mental break. She denies homicidal or suicidal ideation.  She denies being in any pain. She describes no hallucinations, but just severe paranoia and feeling like she is "losing it.  Patient denies any overdose. She remains compliant with her normal daily medications.  Patient reports compliant with her medications, and denies any new events or activities.  The patient does say that she has been admitted previously and had large workups done at other hospitals for similar. She is been admitted to [facility] for similar symptoms in the past.   Past Medical History  Diagnosis Date  . Arthritis   . Diabetes mellitus without complication   . Hypertension   . Seasonal allergies   . Hypothyroid   . Hypercholesterolemia     There are no active problems to display for this patient.   Past Surgical History  Procedure Laterality Date  . Abdominal hysterectomy    . Kidney stone surgery    . Cholecystectomy      No current outpatient prescriptions on file.  Allergies Navane and Penicillins  History reviewed. No pertinent family  history.  Social History History  Substance Use Topics  . Smoking status: Former Research scientist (life sciences)  . Smokeless tobacco: Not on file  . Alcohol Use: No    Review of Systems  Constitutional: Negative for fever. Eyes: Negative for visual changes. ENT: Negative for sore throat. Cardiovascular: Negative for chest pain. Respiratory: Negative for shortness of breath. Gastrointestinal: Negative for abdominal pain, vomiting and diarrhea. Genitourinary: Negative for dysuria. Musculoskeletal: Negative for back pain. Skin: Negative for rash. Neurological: Negative for headaches, focal weakness or numbness. See H&P. Of note patient denies any homicidal or suicidal ideation.  10-point ROS otherwise negative.  ____________________________________________   PHYSICAL EXAM:  VITAL SIGNS: ED Triage Vitals  Enc Vitals Group     BP 09/18/14 1018 170/103 mmHg     Pulse Rate 09/18/14 1018 97     Resp --      Temp 09/18/14 1018 98.3 F (36.8 C)     Temp Source 09/18/14 1018 Oral     SpO2 09/18/14 1018 96 %     Weight 09/18/14 1018 160 lb (72.576 kg)     Height 09/18/14 1018 5\' 1"  (1.549 m)     Head Cir --      Peak Flow --      Pain Score --      Pain Loc --      Pain Edu? --      Excl. in [place]? --      Constitutional: Alert and oriented. Well appearing and in no distress. Eyes: Conjunctivae are normal. PERRL. Normal extraocular movements. ENT   Head: Normocephalic and atraumatic.  Nose: No congestion/rhinnorhea.   Mouth/Throat: Mucous membranes are moist.   Neck: No stridor. Hematological/Lymphatic/Immunilogical: No cervical lymphadenopathy. Cardiovascular: Normal rate, regular rhythm. Normal and symmetric distal pulses are present in all extremities. No murmurs, rubs, or gallops. Respiratory: Normal respiratory effort without tachypnea nor retractions. Breath sounds are clear and equal bilaterally. No wheezes/rales/rhonchi. Gastrointestinal: Soft and nontender. No distention.  No abdominal bruits. There is no CVA tenderness. Genitourinary: deferred Musculoskeletal: Nontender with normal range of motion in all extremities. No joint effusions.   Right lower leg:  No tenderness or edema.   Left lower leg:  No tenderness or edema. Neurologic:  Normal speech and language. No gross focal neurologic deficits are appreciated. Speech is normal. No gait instability. Skin:  Skin is warm, dry and intact. No rash noted. Psychiatric: Patient is appearing mildly anxious, she reports feeling paranoid being at the hospital and with physicians and police officers nearby. Overall her affect and mood are elevated. She is has a slightly agitated stance, but demonstrates no violent or self harming behavior. Patient is alert and oriented 4 at this time  ____________________________________________    ____________________________________________   PROCEDURES  Procedure(s) performed: None  Critical Care performed: No  ____________________________________________   INITIAL IMPRESSION / ASSESSMENT AND PLAN / ED COURSE  Pertinent labs & imaging results that were available during my care of the patient were reviewed by me and considered in my medical decision making (see chart for details).  Patient presents for voluntary psychiatric assessment at this time. Patient notes that she is absolutely willing to stay to be seen by psychiatry as her paranoia symptoms are worsening and she is seeking care voluntarily. Patient is not under IVC at this time.  Although the patient does have advanced age, it is notable that she does have a documented history of psychiatric disease and admissions to previous hospitals including [facility] with workups there. She presently complains of no medical condition. Will send basic behavior medicine screening labs and assessment. Consult psychiatry.  I do not feel that CT imaging or imaging is required at this time based on the patient's prior  history of said disease.  ____________________________________________   Results for orders placed or performed during the hospital encounter of 09/18/14  CBC  Result Value Ref Range   WBC 9.1 3.6 - 11.0 K/uL   RBC 4.46 3.80 - 5.20 MIL/uL   Hemoglobin 12.9 12.0 - 16.0 g/dL   HCT 39.6 35.0 - 47.0 %   MCV 88.7 80.0 - 100.0 fL   MCH 28.8 26.0 - 34.0 pg   MCHC 32.5 32.0 - 36.0 g/dL   RDW 13.2 11.5 - 14.5 %   Platelets 234 150 - 440 K/uL  Comprehensive metabolic panel  Result Value Ref Range   Sodium 139 135 - 145 mmol/L   Potassium 4.4 3.5 - 5.1 mmol/L   Chloride 106 101 - 111 mmol/L   CO2 24 22 - 32 mmol/L   Glucose, Bld 109 (H) 65 - 99 mg/dL   BUN 26 (H) 6 - 20 mg/dL   Creatinine, Ser 1.03 (H) 0.44 - 1.00 mg/dL   Calcium 9.8 8.9 - 10.3 mg/dL   Total Protein 6.8 6.5 - 8.1 g/dL   Albumin 4.0 3.5 - 5.0 g/dL   AST 20 15 - 41 U/L   ALT 10 (L) 14 - 54 U/L   Alkaline Phosphatase 35 (L) 38 - 126 U/L   Total Bilirubin 0.4 0.3 - 1.2 mg/dL   GFR calc non Af  Amer 54 (L) >60 mL/min   GFR calc Af Amer >60 >60 mL/min   Anion gap 9 5 - 15   Insert time stamp patient remains calm and awaiting psychiatric evaluation. At present initial screening labs including CBC and comp rate of metabolic panel do not show acute concerning anomaly.  Continue to await urinalysis from patient.  FINAL CLINICAL IMPRESSION(S) / ED DIAGNOSES  Final diagnoses:  Paranoia (psychosis)   Inserted time stent care and disposition assigned to Dr. Suezanne Jacquet at this time. Pending UA and U tox. Psychiatric consult pending.  Delman Kitten, MD 09/18/14 [id number]

## 2014-09-18 NOTE — ED Notes (Signed)
BEHAVIORAL HEALTH ROUNDING Patient sleeping: Yes.   Patient alert and oriented: no Behavior appropriate: Yes.  ; If no, describe: sleeping Nutrition and fluids offered: No and sleeping Toileting and hygiene offered: No and sleeping Sitter present: no Law enforcement present: Yes  ODS

## 2014-09-18 NOTE — ED Notes (Signed)
Denies needs at this time. Pt appears sleepy in the bed. NAD noted at this time.

## 2014-09-18 NOTE — ED Notes (Signed)
Meal given with drink

## 2014-09-18 NOTE — ED Notes (Signed)
Pt seemed agitated.  She spoke quickly.  She had c/o of her meal but pt had been provided another dinner, which she found acceptable.  Pt said she was diabetic.  New diet orders for carb modified and for a Glucerna supplement were placed.  Conversation between pt and RN:  Pt asked RN when in the morning the "cops" were coming to get her ( the pt).  RN asked pt why the cops were coming to get her.  Pt said they were coming to take her to jail because she was a criminal.   Investment banker, corporate) "What kind of criminal are you?" (Pt)   "The biggest kind of criminal.  I screwed with the local, the state, and the federal government."    RN told pt that the police probably would not take her out of the hospital.   "Wouldn't you rather stay here with [place] than go to jail?"           "No! I'd rather go to jail than have anything to do with [facility]."  "This is not [facility].  This is [facility]."            "Well, I'm not going to [place] no matter what!"  "You will certainly be able to express your feelings to the social worker when she comes to see you.  You can help plan where you go from here.  O.K.?"           "I've never had a Education officer, museum before, you know."   "You have one now.  That's good, isn't it?"             "Yeah, I guess so.  Yeah, that's good.  Thank you."    Pt has calmed. Breathing even and unlabored. She has been smiling, is sitting on the side of the bed and eating dinner.

## 2014-09-18 NOTE — ED Notes (Signed)
NAD noted. Pt resting in bed. Denies needs at this time.

## 2014-09-18 NOTE — ED Notes (Signed)
BEHAVIORAL HEALTH ROUNDING Patient sleeping: No. Patient alert and oriented: yes Behavior appropriate: Yes.  ; If no, describe:  Nutrition and fluids offered: Yes  Toileting and hygiene offered: Yes  Sitter present: no Law enforcement present: Yes ODS 

## 2014-09-18 NOTE — ED Notes (Signed)
Escorted by RN, pt arrives BHU 1 without incident.

## 2014-09-18 NOTE — ED Notes (Signed)
NAD noted. Pt gave urine specimen. Pt requests ginger ale.

## 2014-09-18 NOTE — ED Notes (Signed)
Dinner 40%; 240 ml.

## 2014-09-18 NOTE — ED Notes (Signed)
BEHAVIORAL HEALTH ROUNDING Patient sleeping: No. Patient alert and oriented: yes Behavior appropriate: Yes.  ; If no, describe:  Nutrition and fluids offered: Yes Toileting and hygiene offered: Yes  Sitter present: yes Law enforcement present: Yes ODS  

## 2014-09-18 NOTE — ED Notes (Signed)
BEHAVIORAL HEALTH ROUNDING Patient sleeping: No. Patient alert and oriented: yes Behavior appropriate: No.; If no, describe: Mild aggression noted with staff. Pt will move hands/arms rapidly when speaking when she begins to get frustrated.  Nutrition and fluids offered: No and Pt just arrived Toileting and hygiene offered: Yes  Sitter present: yes Law enforcement present: Yes ODS

## 2014-09-18 NOTE — BH Assessment (Signed)
Assessment Note   [name] is an 69 y.o. female who reports to the ED by way of EMS.  She states that "I was afraid I was gonna lose my mind and that I was not going to get it back".  She states that she is feeling somewhat paranoid. She stated that she has a fear of people in authority, usually men.  She reports being depressed and that she has noticed a definite change in her appetite. She states that she has gained 30 pounds due to not eating properly due to depression. She denied symptoms of anxiety.  She denied having auditory or visual hallucinations.  She denied having homicidal or suicidal ideation or intent. [name]  Reports that she has had problems for many years and that she feared telling others the truth about her symptoms.  She further stated that she had to come to the emergency room now, so that she did not forget what was happening and be evaluated while her symptoms were current.  She stated that she wants to be aware of what is going on.     Past Medical History  Diagnosis Date   Arthritis    Diabetes mellitus without complication    Hypertension    Seasonal allergies    Hypothyroid    Hypercholesterolemia        Past Medical History:  Past Medical History  Diagnosis Date   Arthritis    Diabetes mellitus without complication    Hypertension    Seasonal allergies    Hypothyroid    Hypercholesterolemia     Past Surgical History  Procedure Laterality Date   Abdominal hysterectomy     Kidney stone surgery     Cholecystectomy      Family History: History reviewed. No pertinent family history.  Social History:  reports that she has quit smoking. She does not have any smokeless tobacco history on file. She reports that she does not drink alcohol or use illicit drugs.  Allergies:  Allergies  Allergen Reactions   Navane [Thiothixene] Other (See Comments)    Pt is unable to state what her reaction is, states she was given a piece of  paper that said she was allergic to it.    Penicillins Rash    Home Medications:  (Not in a hospital admission)  OB/GYN Status:  No LMP recorded. Patient has had a hysterectomy.  General Assessment Data Location of Assessment: [facility] ED TTS Assessment: In system Is this a Tele or Face-to-Face Assessment?: Face-to-Face Is this an Initial Assessment or a Re-assessment for this encounter?: Initial Assessment Marital status: Single Is patient pregnant?: No Living Arrangements: Alone Can pt return to current living arrangement?: Yes Admission Status: Voluntary Is patient capable of signing voluntary admission?: Yes Referral Source: Self/Family/Friend Insurance type: Medicare  Risk to self with the past 6 months Suicidal Ideation: No Has patient been a risk to self within the past 6 months prior to admission? : No Suicidal Intent: No Has patient had any suicidal intent within the past 6 months prior to admission? : No Is patient at risk for suicide?: No Suicidal Plan?: No Has patient had any suicidal plan within the past 6 months prior to admission? : No Depression: Yes Depression Symptoms: Tearfulness Substance abuse history and/or treatment for substance abuse?: No  Risk to Others within the past 6 months Homicidal Ideation: No Thoughts of Harm to Others: No Current Homicidal Intent: No Current Homicidal Plan: No Criminal Charges Pending?: No Does  patient have a court date: No Is patient on probation?: No  Mental Status Report Appearance/Hygiene: Disheveled Eye Contact: Good Motor Activity: Restlessness Speech: Unremarkable Level of Consciousness: Alert Mood: Irritable Affect: Flat Anxiety Level: Minimal Thought Processes: Relevant Judgement: Impaired Orientation: Person, Place, Time, Situation Obsessive Compulsive Thoughts/Behaviors: None  Cognitive Functioning Concentration: Decreased Appetite: Good  Prior Inpatient Therapy Prior Inpatient Therapy:  Yes Prior Therapy Dates: Inpatient at [place], [facility], [facility] and [place] [facility]) Does patient have an advance directive?: No Would patient like information on creating an advanced directive?: No - patient declined information    Additional Information CIRT Risk: No (At the time the patient is calm and relaxed) Elopement Risk: No     Disposition:  Disposition Initial Assessment Completed for this Encounter: Yes Disposition of Patient: Other dispositions (To be seen by the psychiatrist)  On Site Evaluation by:   Reviewed with Physician:     Guerry Minors Cindy, LCAS, LPCA Assessment Counselor 09/18/2014 12:24 PM

## 2014-09-18 NOTE — ED Notes (Signed)
BEHAVIORAL HEALTH ROUNDING Patient sleeping: No. Patient alert and oriented: yes Behavior appropriate: Yes.  ; If no, describe:  Nutrition and fluids offered: Yes  Toileting and hygiene offered: Yes  Sitter present: no Law enforcement present: Yes  

## 2014-09-18 NOTE — ED Notes (Signed)
BEHAVIORAL HEALTH ROUNDING Patient sleeping: Yes.   Patient alert and oriented: yes Behavior appropriate: Yes.  ; If no, describe:  Nutrition and fluids offered: Yes  Toileting and hygiene offered: Yes  Sitter present: yes Law enforcement present: Yes ODS  

## 2014-09-18 NOTE — ED Notes (Signed)
Pt presents to ED via [facility]. Per EMS pt is from home, pt was in group therapy on Friday when she realized "what they were saying is true". Pt called EMS due to "losing her mind". Pt has hx of depression, EMS states pt C/O changes in eating habits and pt is "unable to read because she can't sit still and her mind begins to wander". Pt presents with mild agitation.

## 2014-09-18 NOTE — ED Notes (Signed)
Pt sitting in the chair in her room; head leaning against the wall.  When RN asked pt if she wanted the  TV, pt replied "No!  It is so quiet and peaceful here.  Just let me enjoy this."

## 2014-09-18 NOTE — ED Notes (Signed)
Report received from Sara, RN

## 2014-09-18 NOTE — ED Notes (Signed)
Dr Clearnce Hasten informed of pt's two previous BP's.  Since pt is calmer and BP is coming down, no intervention is indicated at this time.  Will continue to monitor.

## 2014-09-18 NOTE — ED Notes (Signed)
Pt resting in bed. Pt alert and oriented. NAD noted. Pt requests box of tissues.

## 2014-09-18 NOTE — ED Notes (Signed)

## 2014-09-19 MED ORDER — SIMVASTATIN 20 MG PO TABS
20.0000 mg | ORAL_TABLET | Freq: Every day | ORAL | Status: DC
  Administered 2014-09-19 – 2014-09-20 (×2): 20 mg via ORAL
  Filled 2014-09-19: qty 1

## 2014-09-19 MED ORDER — SIMVASTATIN 20 MG PO TABS
ORAL_TABLET | ORAL | Status: AC
  Administered 2014-09-19: 20 mg via ORAL
  Filled 2014-09-19: qty 1

## 2014-09-19 MED ORDER — OLOPATADINE HCL 0.1 % OP SOLN
1.0000 [drp] | Freq: Two times a day (BID) | OPHTHALMIC | Status: DC
  Administered 2014-09-20: 1 [drp] via OPHTHALMIC
  Filled 2014-09-19 (×2): qty 5

## 2014-09-19 MED ORDER — ONDANSETRON 4 MG PO TBDP
ORAL_TABLET | ORAL | Status: AC
  Filled 2014-09-19: qty 1

## 2014-09-19 MED ORDER — METFORMIN HCL 500 MG PO TABS: 1000.0000 mg | ORAL_TABLET | Freq: Two times a day (BID) | ORAL | Status: DC

## 2014-09-19 MED ORDER — ONDANSETRON 4 MG PO TBDP
4.0000 mg | ORAL_TABLET | Freq: Once | ORAL | Status: AC
  Administered 2014-09-19: 4 mg via ORAL

## 2014-09-19 MED ORDER — BUPROPION HCL ER (SR) 100 MG PO TB12
100.0000 mg | ORAL_TABLET | Freq: Every day | ORAL | Status: DC
  Administered 2014-09-19 – 2014-09-20 (×2): 100 mg via ORAL
  Filled 2014-09-19 (×2): qty 1

## 2014-09-19 MED ORDER — LEVOTHYROXINE SODIUM 75 MCG PO TABS
75.0000 ug | ORAL_TABLET | ORAL | Status: DC
  Administered 2014-09-19 – 2014-09-20 (×2): 75 ug via ORAL
  Filled 2014-09-19 (×3): qty 1

## 2014-09-19 NOTE — ED Notes (Signed)
ED BHU [facility] Is the patient under IVC or is there intent for IVC: No. Is the patient medically cleared: Yes.   Is there vacancy in the ED BHU: Yes.   Is the population mix appropriate for patient: Yes.   Is the patient awaiting placement in inpatient or outpatient setting: No. Has the patient had a psychiatric consult: Yes.   Survey of unit performed for contraband, proper placement and condition of furniture, tampering with fixtures in bathroom, shower, and each patient room: Yes.  ; Findings:  APPEARANCE/BEHAVIOR Pt AAO X3. Cooperative.  NEURO ASSESSMENT Orientation: time, person, place Hallucinations: No.None noted (Hallucinations) Speech: Normal Gait: normal RESPIRATORY ASSESSMENT Normal expansion.  Clear to auscultation.  No rales, rhonchi, or wheezing. CARDIOVASCULAR ASSESSMENT regular rate and rhythm, S1, S2 normal, no murmur, click, rub or gallop GASTROINTESTINAL ASSESSMENT soft, nontender, BS WNL, no r/g EXTREMITIES Moves all extremities. PLAN OF CARE Provide calm/safe environment. Vital signs assessed twice daily. ED BHU Assessment once each 12-hour shift. Collaborate with intake RN daily or as condition indicates. Assure the ED provider has rounded once each shift. Provide and encourage hygiene. Provide redirection as needed. Assess for escalating behavior; address immediately and inform ED provider.  Assess family dynamic and appropriateness for visitation as needed: Yes.  ; If necessary, describe findings:  Educate the patient/family about BHU procedures/visitation: Yes.  ; If necessary, describe findings:

## 2014-09-19 NOTE — ED Notes (Signed)
ENVIRONMENTAL ASSESSMENT Potentially harmful objects out of patient reach: yes Personal belongings secured: yes Patient dressed in hospital provided attire only: yes Plastic bags out of patient reach: yes Patient care equipment (cords, cables, call bells, lines, and drains) shortened, removed, or accounted for: none present Equipment and supplies removed from bottom of stretcher: n/a, no stretcher Potentially toxic materials out of patient reach: yes Sharps container removed or out of patient reach: yes

## 2014-09-19 NOTE — ED Notes (Signed)
BEHAVIORAL HEALTH ROUNDING Patient sleeping: No Patient alert and oriented: yes Behavior appropriate: yes Nutrition and fluids offered: yes Toileting and hygiene offered: yes Sitter present: no Law enforcement present: yes, ODS [place]

## 2014-09-19 NOTE — ED Notes (Signed)
ENVIRONMENTAL ASSESSMENT Potentially harmful objects out of patient reach: yes Personal belongings secured: yes Patient dressed in hospital provided attire only: yes Plastic bags out of patient reach: yes Patient care equipment (cords, cables, call bells, lines, and drains) shortened, removed, or accounted for: yes Equipment and supplies removed from bottom of stretcher: n/a; no strecther Potentially toxic materials out of patient reach: yes Sharps container removed or out of patient reach: yes

## 2014-09-19 NOTE — ED Notes (Signed)
BEHAVIORAL HEALTH ROUNDING Patient sleeping: No. Patient alert and oriented: yes Behavior appropriate: Yes.  ; If no, describe:  Nutrition and fluids offered: No Toileting and hygiene offered: Yes  Sitter present: no Law enforcement present: Yes, ODS

## 2014-09-19 NOTE — ED Notes (Signed)
ENVIRONMENTAL ASSESSMENT Potentially harmful objects out of patient reach: yes Personal belongings secured: yes Patient dressed in hospital provided attire only: yes Plastic bags out of patient reach: yes Patient care equipment (cords, cables, call bells, lines, and drains) shortened, removed, or accounted for: none present Equipment and supplies removed from bottom of stretcher: n/a no stretcher  Potentially toxic materials out of patient reach: yes Sharps container removed or out of patient reach: yes

## 2014-09-19 NOTE — ED Notes (Addendum)
Copies of labwork/urine drug screen attached to hard copy of chart. MD Archie Balboa made aware and has reviewed drug screen results. Still pending translation into epic.

## 2014-09-19 NOTE — ED Notes (Signed)
Pt states that she does not want her eye drops, patanol. Eye drops just recently dropped off by pharmacy. Pt states she usually takes it in the morning and states she will wait til tomorrow morning.

## 2014-09-19 NOTE — ED Notes (Signed)
.  [facility] 

## 2014-09-19 NOTE — ED Notes (Signed)
BEHAVIORAL HEALTH ROUNDING Patient sleeping: Yes.   Patient alert and oriented: no Behavior appropriate: Yes.  ; If no, describe:   Nutrition and fluids offered: No Toileting and hygiene offered: No Sitter present: not applicable Law enforcement present: Yes  and ODS

## 2014-09-19 NOTE — Consult Note (Signed)
[facility] Consult   Reason for Consult:  Patient with bipolar disorder presents with agitation and confusion and paranoia Referring Physician:  er Patient Identification: [name] MRN:  [id number] Principal Diagnosis: <principal problem not specified> Diagnosis:   Patient Active Problem List   Diagnosis Date Noted  . Bipolar disorder, mixed [F31.60] 09/19/2014    Total Time spent with patient: 1.5 hours  Subjective:   [name] is a 69 y.o. female patient admitted with bipolar disorder with psychosis and agitation.  HPI:  Patient presents with agitated behavior and depression and paranoia and poor self care HPI Elements:   Quality:  anxious. Severity:  severe. Timing:  worse for days. Duration:  ongoing. Context:  isolation and poor self care.  Past Medical History:  Past Medical History  Diagnosis Date  . Arthritis   . Diabetes mellitus without complication   . Hypertension   . Seasonal allergies   . Hypothyroid   . Hypercholesterolemia     Past Surgical History  Procedure Laterality Date  . Abdominal hysterectomy    . Kidney stone surgery    . Cholecystectomy     Family History: History reviewed. No pertinent family history. Social History:  History  Alcohol Use No     History  Drug Use No    History   Social History  . Marital Status: Divorced    Spouse Name: N/A  . Number of Children: N/A  . Years of Education: N/A   Social History Main Topics  . Smoking status: Former Research scientist (life sciences)  . Smokeless tobacco: Not on file  . Alcohol Use: No  . Drug Use: No  . Sexual Activity: Not Currently   Other Topics Concern  . None   Social History Narrative  . None   Additional Social History:    History of alcohol / drug use?: No history of alcohol / drug abuse                     Allergies:   Allergies  Allergen Reactions  . Navane [Thiothixene] Other (See Comments)    Pt is unable to state what her reaction is, states  she was given a piece of paper that said she was allergic to it.   Marland Kitchen Penicillins Rash    Labs:  Results for orders placed or performed during the hospital encounter of 09/18/14 (from the past 48 hour(s))  CBC     Status: None   Collection Time: 09/18/14 12:20 PM  Result Value Ref Range   WBC 9.1 3.6 - 11.0 K/uL   RBC 4.46 3.80 - 5.20 MIL/uL   Hemoglobin 12.9 12.0 - 16.0 g/dL   HCT 39.6 35.0 - 47.0 %   MCV 88.7 80.0 - 100.0 fL   MCH 28.8 26.0 - 34.0 pg   MCHC 32.5 32.0 - 36.0 g/dL   RDW 13.2 11.5 - 14.5 %   Platelets 234 150 - 440 K/uL  Comprehensive metabolic panel     Status: Abnormal   Collection Time: 09/18/14 12:20 PM  Result Value Ref Range   Sodium 139 135 - 145 mmol/L   Potassium 4.4 3.5 - 5.1 mmol/L   Chloride 106 101 - 111 mmol/L   CO2 24 22 - 32 mmol/L   Glucose, Bld 109 (H) 65 - 99 mg/dL   BUN 26 (H) 6 - 20 mg/dL   Creatinine, Ser 1.03 (H) 0.44 - 1.00 mg/dL   Calcium 9.8 8.9 - 10.3 mg/dL  Total Protein 6.8 6.5 - 8.1 g/dL   Albumin 4.0 3.5 - 5.0 g/dL   AST 20 15 - 41 U/L   ALT 10 (L) 14 - 54 U/L   Alkaline Phosphatase 35 (L) 38 - 126 U/L   Total Bilirubin 0.4 0.3 - 1.2 mg/dL   GFR calc non Af Amer 54 (L) >60 mL/min   GFR calc Af Amer >60 >60 mL/min    Comment: (NOTE) The eGFR has been calculated using the CKD EPI equation. This calculation has not been validated in all clinical situations. eGFR's persistently <90 mL/min signify possible Chronic Kidney Disease.    Anion gap 9 5 - 15  Acetaminophen level     Status: Abnormal   Collection Time: 09/18/14 12:20 PM  Result Value Ref Range   Acetaminophen (Tylenol), Serum <10 (L) 10 - 30 ug/mL    Comment:        THERAPEUTIC CONCENTRATIONS VARY SIGNIFICANTLY. A RANGE OF 10-30 ug/mL MAY BE AN EFFECTIVE CONCENTRATION FOR MANY PATIENTS. HOWEVER, SOME ARE BEST TREATED AT CONCENTRATIONS OUTSIDE THIS RANGE. ACETAMINOPHEN CONCENTRATIONS >150 ug/mL AT 4 HOURS AFTER INGESTION AND >50 ug/mL AT 12 HOURS AFTER  INGESTION ARE OFTEN ASSOCIATED WITH TOXIC REACTIONS.     Vitals: Blood pressure 119/59, pulse 70, temperature 97.8 F (36.6 C), temperature source Oral, resp. rate 18, height 5' 1"  (1.549 m), weight 72.576 kg (160 lb), SpO2 94 %.  Risk to Self: Suicidal Ideation: No Suicidal Intent: No Is patient at risk for suicide?: No Suicidal Plan?: No Risk to Others: Homicidal Ideation: No Thoughts of Harm to Others: No Current Homicidal Intent: No Current Homicidal Plan: No Criminal Charges Pending?: No Does patient have a court date: No Prior Inpatient Therapy: Prior Inpatient Therapy: Yes Prior Therapy Dates: Inpatient at [place], [place], [facility] and [facility]  Prior Outpatient Therapy: Prior Outpatient Therapy: Yes Prior Therapy Dates: Currently being  seen at [facility]  Current Facility-Administered Medications  Medication Dose Route Frequency Provider Last Rate Last Dose  . buPROPion (WELLBUTRIN SR) 12 hr tablet 100 mg  100 mg Oral Daily Carrie Mew, MD   100 mg at 09/19/14 1324  . clonazePAM (KLONOPIN) tablet 1 mg  1 mg Oral QHS Doran Stabler, MD   0 mg at 09/18/14 2152  . enalapril (VASOTEC) tablet 10 mg  10 mg Oral BID Doran Stabler, MD   10 mg at 09/19/14 1327  . levothyroxine (SYNTHROID, LEVOTHROID) tablet 75 mcg  75 mcg Oral Kyra Leyland, MD   75 mcg at 09/19/14 1327  . metFORMIN (GLUCOPHAGE) tablet 1,000 mg  1,000 mg Oral BID AC & HS Doran Stabler, MD   1,000 mg at 09/19/14 0800  . olopatadine (PATANOL) 0.1 % ophthalmic solution 1 drop  1 drop Both Eyes BID Carrie Mew, MD   1 drop at 09/19/14 1628  . simvastatin (ZOCOR) tablet 20 mg  20 mg Oral Daily Carrie Mew, MD   20 mg at 09/19/14 1328  . traZODone (DESYREL) tablet 100 mg  100 mg Oral QHS Doran Stabler, MD   0 mg at 09/18/14 2153   Current Outpatient Prescriptions  Medication Sig Dispense Refill  . buPROPion (WELLBUTRIN SR) 100 MG 12 hr tablet Take 100 mg by mouth daily.     . clonazePAM (KLONOPIN) 1 MG tablet Take 1 mg by mouth at bedtime.    . enalapril (VASOTEC) 20 MG tablet Take 20 mg by mouth 2 (two) times daily.    Marland Kitchen  levocetirizine (XYZAL) 5 MG tablet Take 5 mg by mouth every evening.    Marland Kitchen levothyroxine (SYNTHROID, LEVOTHROID) 75 MCG tablet Take 75 mcg by mouth every morning.    . metFORMIN (GLUCOPHAGE) 1000 MG tablet Take 1,000 mg by mouth 2 (two) times daily.    . Olopatadine HCl 0.2 % SOLN Apply 1 drop to eye daily. Apply 1 drop to both eyes daily    . simvastatin (ZOCOR) 20 MG tablet Take 20 mg by mouth daily.      Musculoskeletal: Strength & Muscle Tone: within normal limits Gait & Station: normal Patient leans: N/A  Psychiatric Specialty Exam: Physical Exam  Constitutional: She appears well-nourished.  HENT:  Head: Normocephalic.  Eyes: Pupils are equal, round, and reactive to light.  Neck: Normal range of motion.  Psychiatric: Her mood appears anxious. Her speech is rapid and/or pressured. She is agitated. Thought content is paranoid and delusional. Cognition and memory are impaired. She expresses impulsivity and inappropriate judgment. She exhibits a depressed mood.    Review of Systems  HENT: Negative.   Eyes: Negative.   Respiratory: Negative.   Cardiovascular: Negative.   Gastrointestinal: Negative.   Musculoskeletal: Negative.   Skin: Negative.   Neurological: Negative.   Psychiatric/Behavioral: Positive for depression. Negative for suicidal ideas and substance abuse. The patient is nervous/anxious and has insomnia.     Blood pressure 119/59, pulse 70, temperature 97.8 F (36.6 C), temperature source Oral, resp. rate 18, height 5' 1"  (1.549 m), weight 72.576 kg (160 lb), SpO2 94 %.Body mass index is 30.25 kg/(m^2).  General Appearance: Disheveled  Eye Contact::  Minimal  Speech:  Pressured  Volume:  Increased  Mood:  Angry  Affect:  Flat  Thought Process:  Disorganized  Orientation:  Full (Time, Place, and Person)   Thought Content:  Delusions, Hallucinations: None, Ideas of Reference:   Paranoia and Paranoid Ideation  Suicidal Thoughts:  No  Homicidal Thoughts:  Yes.  without intent/plan  Memory:  Recent;   Poor  Judgement:  Poor  Insight:  Lacking  Psychomotor Activity:  EPS and TD  Concentration:  Negative  Recall:  Negative  Fund of Knowledge:Fair  Language: Fair  Akathisia:  Yes  Handed:  Right  AIMS (if indicated):     Assets:  Housing  ADL's:  Intact  Cognition: Impaired,  Moderate  Sleep:      Medical Decision Making: Review of Psycho-Social Stressors (1), Review or order clinical lab tests (1), Discuss test with performing physician (1), Review and summation of old records (2), Established Problem, Worsening (2) and Review of Medication Regimen & Side Effects (2)  Treatment Plan Summary: Plan admit to psychiatry  Plan:  Recommend psychiatric Inpatient admission when medically cleared. Disposition: admit to psychhiatry under IVC  Cherylanne Ardelean, [facility] 09/19/2014 9:29 PM

## 2014-09-19 NOTE — ED Notes (Signed)
This RN Lab called multiple times about unresulted drug screen. Lab states that drug screen is resulting, but is "not crossing over to epic". Will continue to follow. Results sent via paper from lab.

## 2014-09-19 NOTE — ED Notes (Signed)
Pt vomited small amount. Pt requesting something for nausea.

## 2014-09-19 NOTE — ED Notes (Signed)
BEHAVIORAL HEALTH ROUNDING Patient sleeping: Yes.   Patient alert and oriented: yes Behavior appropriate: Yes.  ; If no, describe:  Nutrition and fluids offered: Yes  Toileting and hygiene offered: Yes  Sitter present: no Law enforcement present: Yes, ODS

## 2014-09-19 NOTE — ED Notes (Signed)
ENVIRONMENTAL ASSESSMENT Potentially harmful objects out of patient reach: yes Personal belongings secured: yes Patient dressed in hospital provided attire only: yes Plastic bags out of patient reach: yes Patient care equipment (cords, cables, call bells, lines, and drains) shortened, removed, or accounted for: all removed Equipment and supplies removed from bottom of stretcher: n/a no stretcher Potentially toxic materials out of patient reach: yes

## 2014-09-19 NOTE — ED Notes (Signed)
BEHAVIORAL HEALTH ROUNDING Patient sleeping: Yes.   Patient alert and oriented: no Behavior appropriate: Yes.  ; If no, describe:   Nutrition and fluids offered: No Toileting and hygiene offered: No Sitter present: no Law enforcement present: Yes  and ODS

## 2014-09-19 NOTE — ED Notes (Signed)
ED BHU PLACEMENT JUSTIFICATION Is the patient under IVC or is there intent for IVC: yes Is the patient medically cleared: yes Is there vacancy in the ED BHU: yes Is the population mix appropriate for patient: yes Is the patient awaiting placement in inpatient or outpatient setting: yes Has the patient had a psychiatric consult: yes Survey of unit performed for contraband, proper placement and condition of furniture, tampering with fixtures in bathroom, shower, and each patient room: yes APPEARANCE/BEHAVIOR Pt calm and cooperative at this time. NEURO ASSESSMENT Orientation: alert and oriented x 3 Hallucinations: no Speech: normal, clear Gait: steady RESPIRATORY ASSESSMENT No resp distress CARDIOVASCULAR ASSESSMENT Regular rate and rhythm GASTROINTESTINAL ASSESSMENT No acute distress EXTREMITIES Pulses palpable PLAN OF CARE Provide calm/safe environment. Vital signs assessed twice daily. ED BHU Assessment once each 12-hour shift. Collaborate with intake RN daily or as condition indicates. Assure the ED provider has rounded once each shift. Provide and encourage hygiene. Provide redirection as needed. Assess for escalating behavior; address immediately and inform ED provider.  Family dynamic assessed and appropriateness for visitation as needed.  Educated the patient about BHU procedures/visitation:

## 2014-09-19 NOTE — ED Notes (Signed)
ENVIRONMENTAL ASSESSMENT Potentially harmful objects out of patient reach: yes Personal belongings secured: yes Patient dressed in hospital provided attire only: yes Plastic bags out of patient reach: yes Patient care equipment (cords, cables, call bells, lines, and drains) shortened, removed, or accounted for: all removed Equipment and supplies removed from bottom of stretcher: n/a no stretcher Potentially toxic materials out of patient reach: yes Sharps container removed or out of patient reach: yes

## 2014-09-19 NOTE — ED Notes (Signed)
Dr. Weber Cooks currently in room speaking with pt. Pt in NAD, alert and oriented x 3, resps even and unlabored, speaking in complete coherent sentences.

## 2014-09-19 NOTE — ED Notes (Signed)
Patient in room eating at this time.

## 2014-09-19 NOTE — ED Notes (Signed)
Pt is resting quietly in room with no acute distress noted at this time. Will continue to monitor.

## 2014-09-19 NOTE — ED Notes (Signed)
Pt in bed with eyes closed at this time. Pt has no needs or concerns at this time.

## 2014-09-19 NOTE — ED Notes (Signed)
Pt report received from [place]. Pt care assumed. Dr.Clapacs in with pt at this time.

## 2014-09-19 NOTE — ED Notes (Signed)
BEHAVIORAL HEALTH ROUNDING Patient sleeping: yes Patient alert and oriented: yes Behavior appropriate: yes Nutrition and fluids offered: yes Toileting and hygiene offered: yes Sitter present: no Law enforcement present: yes, ODS [place]

## 2014-09-19 NOTE — ED Notes (Signed)
MD Archie Balboa notified of pt's emesis x 2. Medication ordered and administered. Pt refusing to take her regular medications at this time. Pt has no further needs or concerns at this time.

## 2014-09-19 NOTE — ED Notes (Signed)
BEHAVIORAL HEALTH ROUNDING Patient sleeping: no Patient alert and oriented: yes Behavior appropriate: yes Nutrition and fluids offered: yes Toileting and hygiene offered: yes Sitter present: no Law enforcement present: yes, ODS

## 2014-09-19 NOTE — ED Notes (Signed)
BEHAVIORAL HEALTH ROUNDING Patient sleeping: no Patient alert and oriented: yes Behavior appropriate: yes Nutrition and fluids offered: yes Toileting and hygiene offered: yes Sitter present: no Law enforcement present: yes, ODS [place]

## 2014-09-19 NOTE — ED Notes (Signed)
Pt's care given to this RN from [place], [place].

## 2014-09-19 NOTE — ED Provider Notes (Signed)
-----------------------------------------   10:55 PM on 09/19/2014 -----------------------------------------  Patient had a couple episodes of emesis. Will give zofran.  Nance Pear, MD 09/19/14 2255

## 2014-09-19 NOTE — ED Notes (Signed)
ED BHU [place] [facility] Is the patient under IVC or is there intent for IVC: No. Is the patient medically cleared: Yes.   Is there vacancy in the ED BHU: Yes.   Is the population mix appropriate for patient: Yes.   Is the patient awaiting placement in inpatient or outpatient setting: No. Has the patient had a psychiatric consult: No. Survey of unit performed for contraband, proper placement and condition of furniture, tampering with fixtures in bathroom, shower, and each patient room: Yes.  ; Findings: all clear APPEARANCE/BEHAVIOR calm NEURO ASSESSMENT Orientation: time, place and person Hallucinations: No.None noted (Hallucinations) Speech: Normal Gait: normal RESPIRATORY ASSESSMENT Normal expansion.  Clear to auscultation.  No rales, rhonchi, or wheezing. CARDIOVASCULAR ASSESSMENT regular rate and rhythm, S1, S2 normal, no murmur, click, rub or gallop GASTROINTESTINAL ASSESSMENT soft, nontender, BS WNL, no r/g EXTREMITIES normal strength, tone, and muscle mass PLAN OF CARE Provide calm/safe environment. Vital signs assessed twice daily. ED BHU Assessment once each 12-hour shift. Collaborate with intake RN daily or as condition indicates. Assure the ED provider has rounded once each shift. Provide and encourage hygiene. Provide redirection as needed. Assess for escalating behavior; address immediately and inform ED provider.  Assess family dynamic and appropriateness for visitation as needed: No.; If necessary, describe findings: na Educate the patient/family about BHU procedures/visitation: Yes.  ; If necessary, describe findings:

## 2014-09-19 NOTE — ED Notes (Signed)
Pt to toilet and returned to bed

## 2014-09-19 NOTE — ED Notes (Signed)
BEHAVIORAL HEALTH ROUNDING Patient sleeping: Yes.   Patient alert and oriented: no Behavior appropriate: Yes.  ; If no, describe: sleeping Nutrition and fluids offered: No Toileting and hygiene offered: No Sitter present: no Law enforcement present: Yes  and ODS

## 2014-09-19 NOTE — ED Notes (Signed)
Pt vomited clear watery emesis beside her bed. Emesis cleaned and pt provided with clean paper scrub pants. Pt asking about snack. Pt advised that since she just vomited we will hold any further food for a while. Pt unhappy but verbalizes understanding.

## 2014-09-19 NOTE — ED Notes (Signed)
Patient in shower at this time. 

## 2014-09-19 NOTE — ED Notes (Signed)

## 2014-09-19 NOTE — ED Notes (Signed)
BEHAVIORAL HEALTH ROUNDING Patient sleeping: no Patient alert and oriented: yes Behavior appropriate: yes Nutrition and fluids offered: yes Toileting and hygiene offered: yes Sitter present: no Law enforcement present: yes

## 2014-09-19 NOTE — ED Notes (Signed)
BEHAVIORAL HEALTH ROUNDING Patient sleeping: Yes.   Patient alert and oriented: no Behavior appropriate: Yes.  ; If no, describe:   Nutrition and fluids offered: No Toileting and hygiene offered: No Sitter present: no Law enforcement present: Yes  ODS

## 2014-09-19 NOTE — ED Notes (Signed)
Pt provided with saltine crackers and sprite for snack.

## 2014-09-19 NOTE — ED Provider Notes (Signed)
-----------------------------------------   7:21 AM on 09/19/2014 -----------------------------------------   BP 167/86 mmHg  Pulse 76  Temp(Src) 98 F (36.7 C) (Oral)  Resp 18  Ht 5\' 1"  (1.549 m)  Wt 160 lb (72.576 kg)  BMI 30.25 kg/m2  SpO2 95%  The patient had no acute events overnight.  Calm and cooperative at this time.  Disposition is pending per Psychiatry/Behavioral Medicine team recommendations. Spoke with [place] who will collect a urine this morning.     Paulette Blanch, MD 09/19/14 [phone]

## 2014-09-20 ENCOUNTER — Inpatient Hospital Stay: Admission: AD | Admit: 2014-09-20 | Payer: Self-pay | Source: Ambulatory Visit

## 2014-09-20 ENCOUNTER — Encounter: Payer: Self-pay | Admitting: Psychiatry

## 2014-09-20 ENCOUNTER — Inpatient Hospital Stay
Admission: AD | Admit: 2014-09-20 | Discharge: 2014-09-23 | DRG: 885 | Disposition: A | Discharge status: Home / Self Care | Payer: Medicare Other | Source: Ambulatory Visit | Attending: Psychiatry | Admitting: Psychiatry

## 2014-09-20 DIAGNOSIS — I10 Essential (primary) hypertension: Secondary | ICD-10-CM | POA: Diagnosis present

## 2014-09-20 DIAGNOSIS — E1169 Type 2 diabetes mellitus with other specified complication: Secondary | ICD-10-CM

## 2014-09-20 DIAGNOSIS — E785 Hyperlipidemia, unspecified: Secondary | ICD-10-CM | POA: Diagnosis present

## 2014-09-20 DIAGNOSIS — Z638 Other specified problems related to primary support group: Secondary | ICD-10-CM | POA: Diagnosis not present

## 2014-09-20 DIAGNOSIS — F3113 Bipolar disorder, current episode manic without psychotic features, severe: Secondary | ICD-10-CM | POA: Diagnosis present

## 2014-09-20 DIAGNOSIS — E119 Type 2 diabetes mellitus without complications: Secondary | ICD-10-CM | POA: Diagnosis present

## 2014-09-20 DIAGNOSIS — E039 Hypothyroidism, unspecified: Secondary | ICD-10-CM | POA: Diagnosis present

## 2014-09-20 DIAGNOSIS — Z90710 Acquired absence of both cervix and uterus: Secondary | ICD-10-CM | POA: Diagnosis not present

## 2014-09-20 DIAGNOSIS — E780 Pure hypercholesterolemia: Secondary | ICD-10-CM | POA: Diagnosis present

## 2014-09-20 DIAGNOSIS — R45851 Suicidal ideations: Secondary | ICD-10-CM | POA: Diagnosis present

## 2014-09-20 DIAGNOSIS — Z87891 Personal history of nicotine dependence: Secondary | ICD-10-CM | POA: Diagnosis not present

## 2014-09-20 DIAGNOSIS — Z91410 Personal history of adult physical and sexual abuse: Secondary | ICD-10-CM | POA: Diagnosis not present

## 2014-09-20 DIAGNOSIS — M1990 Unspecified osteoarthritis, unspecified site: Secondary | ICD-10-CM | POA: Diagnosis present

## 2014-09-20 DIAGNOSIS — Z87442 Personal history of urinary calculi: Secondary | ICD-10-CM

## 2014-09-20 DIAGNOSIS — F3160 Bipolar disorder, current episode mixed, unspecified: Secondary | ICD-10-CM | POA: Diagnosis present

## 2014-09-20 DIAGNOSIS — G4700 Insomnia, unspecified: Secondary | ICD-10-CM | POA: Diagnosis present

## 2014-09-20 DIAGNOSIS — Z9049 Acquired absence of other specified parts of digestive tract: Secondary | ICD-10-CM | POA: Diagnosis present

## 2014-09-20 DIAGNOSIS — F419 Anxiety disorder, unspecified: Secondary | ICD-10-CM | POA: Diagnosis present

## 2014-09-20 MED ORDER — CLONAZEPAM 1 MG PO TABS
1.0000 mg | ORAL_TABLET | Freq: Every day | ORAL | Status: DC
  Administered 2014-09-20 – 2014-09-21 (×2): 1 mg via ORAL
  Filled 2014-09-20 (×2): qty 1

## 2014-09-20 MED ORDER — LEVOTHYROXINE SODIUM 75 MCG PO TABS
75.0000 ug | ORAL_TABLET | ORAL | Status: DC
  Administered 2014-09-21 – 2014-09-23 (×3): 75 ug via ORAL
  Filled 2014-09-20 (×3): qty 1

## 2014-09-20 MED ORDER — MAGNESIUM HYDROXIDE 400 MG/5ML PO SUSP: 30.0000 mL | Freq: Every day | ORAL | Status: DC | PRN

## 2014-09-20 MED ORDER — METFORMIN HCL 500 MG PO TABS
1000.0000 mg | ORAL_TABLET | Freq: Two times a day (BID) | ORAL | Status: DC
  Administered 2014-09-20 – 2014-09-23 (×5): 1000 mg via ORAL
  Filled 2014-09-20 (×5): qty 2

## 2014-09-20 MED ORDER — OLOPATADINE HCL 0.1 % OP SOLN
1.0000 [drp] | Freq: Two times a day (BID) | OPHTHALMIC | Status: DC
  Administered 2014-09-21 – 2014-09-23 (×3): 1 [drp] via OPHTHALMIC
  Filled 2014-09-20 (×2): qty 5

## 2014-09-20 MED ORDER — ACETAMINOPHEN 325 MG PO TABS
650.0000 mg | ORAL_TABLET | Freq: Four times a day (QID) | ORAL | Status: DC | PRN
  Administered 2014-09-21: 650 mg via ORAL
  Filled 2014-09-20: qty 2

## 2014-09-20 MED ORDER — ENALAPRIL MALEATE 10 MG PO TABS
10.0000 mg | ORAL_TABLET | Freq: Two times a day (BID) | ORAL | Status: DC
  Administered 2014-09-20 – 2014-09-23 (×5): 10 mg via ORAL
  Filled 2014-09-20 (×6): qty 1

## 2014-09-20 MED ORDER — SIMVASTATIN 20 MG PO TABS
20.0000 mg | ORAL_TABLET | Freq: Every day | ORAL | Status: DC
  Administered 2014-09-21: 20 mg via ORAL
  Filled 2014-09-20: qty 1

## 2014-09-20 MED ORDER — TRAZODONE HCL 100 MG PO TABS
100.0000 mg | ORAL_TABLET | Freq: Every day | ORAL | Status: DC
  Administered 2014-09-20 – 2014-09-21 (×2): 100 mg via ORAL
  Filled 2014-09-20 (×2): qty 1

## 2014-09-20 MED ORDER — ALUM & MAG HYDROXIDE-SIMETH 200-200-20 MG/5ML PO SUSP: 30.0000 mL | ORAL | Status: DC | PRN

## 2014-09-20 MED ORDER — BUPROPION HCL ER (SR) 100 MG PO TB12
100.0000 mg | ORAL_TABLET | Freq: Every day | ORAL | Status: DC
  Administered 2014-09-21 – 2014-09-23 (×3): 100 mg via ORAL
  Filled 2014-09-20 (×3): qty 1
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
## 2014-09-20 NOTE — ED Notes (Signed)
Pt lying in bed with eyes closed. No unusual behavior observed via camera. Pt has no needs or concerns at this time. Will continue to monitor and f/u as needed.

## 2014-09-20 NOTE — ED Notes (Signed)
BEHAVIORAL HEALTH ROUNDING Patient sleeping: No. Patient alert and oriented: yes Behavior appropriate: Yes.  ; If no, describe:  Nutrition and fluids offered: Yes  Toileting and hygiene offered: Yes  Sitter present: yes  q 15 minute checks Law enforcement present: yes  ODS

## 2014-09-20 NOTE — ED Provider Notes (Signed)
-----------------------------------------   6:29 AM on 09/20/2014 -----------------------------------------   BP 124/62 mmHg  Pulse 81  Temp(Src) 97.9 F (36.6 C) (Oral)  Resp 16  Ht 5\' 1"  (1.549 m)  Wt 160 lb (72.576 kg)  BMI 30.25 kg/m2  SpO2 96%  The patient had no acute events overnight.  Calm and cooperative at this time.  Patient was seen by psychiatrist yesterday. Plan for admission to behavioral medicine unit pending bed availability.   Paulette Blanch, MD 09/20/14 0630

## 2014-09-20 NOTE — ED Notes (Signed)
Pt has walked back and forth through the dayroom   Pt shaking her head   NAD observed

## 2014-09-20 NOTE — ED Notes (Signed)
BEHAVIORAL HEALTH ROUNDING Patient sleeping: No. Patient alert and oriented: yes Behavior appropriate: Yes.  ; If no, describe:  Nutrition and fluids offered: Yes  Toileting and hygiene offered: Yes  Sitter present: yes  q40minute checks Law enforcement present: yes  ODS

## 2014-09-20 NOTE — ED Notes (Signed)
BEHAVIORAL HEALTH ROUNDING Patient sleeping: Yes.   Patient alert and oriented: asleep Behavior appropriate: Yes.  ; If no, describe:  Nutrition and fluids offered: asleep Toileting and hygiene offered: asleep Sitter present: no Law enforcement present: Yes, ODS 

## 2014-09-20 NOTE — ED Notes (Signed)
BEHAVIORAL HEALTH ROUNDING Patient sleeping: No. Patient alert and oriented: yes Behavior appropriate: Yes.  ; If no, describe:  Nutrition and fluids offered: Yes  Toileting and hygiene offered: Yes  Sitter present: yes  q 15 minute checks  Law enforcement present: yes  ODS

## 2014-09-20 NOTE — ED Notes (Signed)
BEHAVIORAL HEALTH ROUNDING Patient sleeping: Yes.   Patient alert and oriented: yes Behavior appropriate: Yes.  ; If no, describe:  Nutrition and fluids offered: Yes  Toileting and hygiene offered: Yes  Sitter present: yes  q 15 minutes checks are being performed Law enforcement present: yes  ODS

## 2014-09-20 NOTE — ED Notes (Signed)
Pt resting in bed with eyes closed. No unusual behavior observed. Pt has no needs or concerns at this time. Will continue to monitor and f/u as needed.  

## 2014-09-20 NOTE — ED Notes (Signed)
Report received  From [facility] RN   Pt has ambulated to and from the BR   Even, steady gait observed  Pt with no verbalized needs at this time

## 2014-09-20 NOTE — Consult Note (Signed)
  Psychiatry: Patient seen. No new complaints. PAtient with bipolar disorder continues to be labile and confused and disorganized. Orders done for admission and patient is awaiting transfer downstairs. No indication to change current treatmet plan.

## 2014-09-20 NOTE — ED Notes (Signed)
Pt up to bathroom and back to bed. Pt lying in bed with eyes closed.

## 2014-09-20 NOTE — ED Notes (Signed)
Lunch provided   Pt observed with no unusual behavior  Continue to monitor

## 2014-09-20 NOTE — ED Notes (Signed)
Pt to the desk requesting information on when she will be admitted  Pt informed that her bed is assigned and we are waiting for the ED doctor to complete her disposition so that I can call report and get her admitted  Pt verbalizes understanding    I assured her that as information becomes available to me that I will come out and let her know immediately

## 2014-09-20 NOTE — ED Notes (Signed)
BEHAVIORAL HEALTH ROUNDING Patient sleeping: No. Patient alert and oriented: yes Behavior appropriate: Yes.  ; If no, describe:  Nutrition and fluids offered: Yes  Toileting and hygiene offered: Yes  Sitter present: yes  q15 minute checks Law enforcement present: yes  ODS

## 2014-09-20 NOTE — ED Notes (Signed)
BEHAVIORAL HEALTH ROUNDING Patient sleeping: No. Patient alert and oriented: yes Behavior appropriate: Yes.  ; If no, describe:  Nutrition and fluids offered: Yes  Toileting and hygiene offered: Yes  Sitter present: yes  q15 minute checks Law enforcement present: yes ODS

## 2014-09-20 NOTE — ED Notes (Signed)
ED [facility] [place] Is the patient under IVC or is there intent for IVC: No. Is the patient medically cleared: Yes.   Is there vacancy in the ED BHU: Yes.   Is the population mix appropriate for patient: Yes.   Is the patient awaiting placement in inpatient or outpatient setting: Yes.   Has the patient had a psychiatric consult: Yes.   Survey of unit performed for contraband, proper placement and condition of furniture, tampering with fixtures in bathroom, shower, and each patient room: Yes.  ; Findings APPEARANCE/BEHAVIOR Calm and cooperative NEURO ASSESSMENT Orientation: alert and orientedx3 Hallucinations: No.None noted (Hallucinations) Speech: Normal Gait: normal RESPIRATORY ASSESSMENT Even nonlabored respirations CARDIOVASCULAR ASSESSMENT regular rate and rhythm, S1, S2 normal, no murmur, click, rub or gallop and regular rate  skin warm and dry GASTROINTESTINAL ASSESSMENT wnl EXTREMITIES fROM   Moves all extremities PLAN OF CARE Provide calm/safe environment. Vital signs assessed twice daily. ED BHU Assessment once each 12-hour shift. Collaborate with intake RN daily or as condition indicates. Assure the ED provider has rounded once each shift. Provide and encourage hygiene. Provide redirection as needed. Assess for escalating behavior; address immediately and inform ED provider.  Assess family dynamic and appropriateness for visitation as needed: Yes.  ; If necessary, describe findings:  Educate the patient/family about BHU procedures/visitation: Yes.  ; If necessary, describe findings:

## 2014-09-20 NOTE — ED Notes (Signed)
Breakfast provided.

## 2014-09-20 NOTE — ED Notes (Signed)
Pt is sitting in her room in the chair   NAD observed   No verbalized needs or complaints at this time  Continue to monitor

## 2014-09-20 NOTE — ED Notes (Signed)
Supper provided   Pt observed with no unusual behavior   NAD assessed  No verbalized needs or complaints at this time

## 2014-09-20 NOTE — ED Notes (Signed)
shower completed

## 2014-09-20 NOTE — ED Notes (Signed)
am meds administered as ordered   Pt denies pain    Assessment completed    Pt to be admitted to [facility]   Continue to monitor

## 2014-09-20 NOTE — ED Notes (Signed)
Report called to [place] RN   Pt with 2/2 bags of belongings and they were transferred with the pt

## 2014-09-20 NOTE — ED Notes (Signed)

## 2014-09-20 NOTE — ED Notes (Signed)
Pt to nurses station stating she has spilled water on her bed. Pt provided with clean linen. Pt changed her bed and wet linens placed in dirty linen.

## 2014-09-20 NOTE — ED Notes (Signed)
Pt is sitting in the chair of her room watching TV   NAD observed   Pt with no verbalized needs or complaints at this time

## 2014-09-20 NOTE — Tx Team (Signed)
Initial Interdisciplinary Treatment Plan   PATIENT STRESSORS: Marital or family conflict   PATIENT STRENGTHS: General fund of knowledge Motivation for treatment/growth   PROBLEM LIST: Problem List/Patient Goals Date to be addressed Date deferred Reason deferred Estimated date of resolution  Depression 09/20/14     Suicidal Ideation 09/20/14                                                DISCHARGE CRITERIA:  Improved stabilization in mood, thinking, and/or behavior  PRELIMINARY DISCHARGE PLAN: Outpatient therapy  PATIENT/FAMIILY INVOLVEMENT: This treatment plan has been presented to and reviewed with the patient, [name], and/or family member.  The patient and family have been given the opportunity to ask questions and make suggestions.  [name] [facility] 09/20/2014, 9:01 PM

## 2014-09-21 ENCOUNTER — Encounter: Payer: Self-pay | Admitting: Psychiatry

## 2014-09-21 DIAGNOSIS — E039 Hypothyroidism, unspecified: Secondary | ICD-10-CM

## 2014-09-21 DIAGNOSIS — E785 Hyperlipidemia, unspecified: Secondary | ICD-10-CM | POA: Diagnosis present

## 2014-09-21 DIAGNOSIS — I10 Essential (primary) hypertension: Secondary | ICD-10-CM | POA: Diagnosis present

## 2014-09-21 MED ORDER — ONDANSETRON 4 MG PO TBDP
4.0000 mg | ORAL_TABLET | Freq: Once | ORAL | Status: AC
  Administered 2014-09-21: 4 mg via ORAL
  Filled 2014-09-21: qty 1

## 2014-09-21 MED ORDER — ONDANSETRON HCL 4 MG PO TABS
ORAL_TABLET | ORAL | Status: AC
  Administered 2014-09-21: 07:00:00
  Filled 2014-09-21: qty 1

## 2014-09-21 MED ORDER — SIMVASTATIN 20 MG PO TABS: 20.0000 mg | ORAL_TABLET | Freq: Every day | ORAL | Status: DC

## 2014-09-21 MED ORDER — GLUCERNA SHAKE PO LIQD
237.0000 mL | Freq: Three times a day (TID) | ORAL | Status: DC
  Administered 2014-09-21: 237 mL via ORAL

## 2014-09-21 NOTE — BHH Group Notes (Signed)
[place] Group Notes:  (Nursing/MHT/Case Management/Adjunct)  Date:  09/21/2014  Time:  9:07 PM  Type of Therapy:  Group Therapy  Participation Level:  Active  Participation Quality:  Appropriate  Affect:  Appropriate  Cognitive:  Alert  Insight:  Appropriate  Engagement in Group:  Engaged  Modes of Intervention:  Education  Summary of Progress/Problems:  [name] 09/21/2014, 9:07 PM

## 2014-09-21 NOTE — Progress Notes (Signed)
Recreation Therapy Notes  INPATIENT RECREATION THERAPY ASSESSMENT  Patient Details Name: [name] MRN: [id number] DOB: 02-25-1946 Today's Date: 09/21/2014  Patient Stressors:  Patient reported no stressors  Coping Skills:   Avoidance, Music  Personal Challenges:  Patient reported no personal challenges  Leisure Interests (2+):  Music - Listen, Individual - Other (Comment) (read, crossword puzzles, movies)  Awareness of Community Resources:  Yes  Community Resources:  Other (Comment) Higher education careers adviser)  Current Use: No  If no, Barriers?: Other (Comment) (did not like it)  Patient Strengths:  intelligent, good mind, likes people, happy and enjoying life right now  Patient Identified Areas of Improvement:  lose more weight, get sugar back under control and keep it that way  Current Recreation Participation:  reading, listening to music, crossword puzzles  Patient Goal for Hospitalization:  To get out and know whe she gets out that she will have a support system like [place] of Residence:  [place] of Residence:  Insurance underwriter   Current SI (including self-harm):  No  Current HI:  No  Consent to Intern Participation: N/A  Due to patient not reporting any personal challenges, LRT will not have a care plan for patient. LRT will continue to check on patient. If patient's status changes, LRT will develop a care plan.  Leonette Monarch, LRT/CTRS 09/21/2014, 5:49 PM
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
## 2014-09-21 NOTE — BHH Group Notes (Signed)
[facility] LCSW Aftercare Discharge Planning Group Note  09/21/2014 10:36 AM  Participation Quality:  Appropriate and Attentive  Affect:  Anxious and Appropriate  Cognitive:  Appropriate  Insight:  Developing/Improving  Engagement in Group:  Engaged  Modes of Intervention:  Discussion and Socialization  Summary of Progress/Problems: CSW shared with group BMU staff roles and explained LOS and discharge process to group members. Pt received a workbook for Wednesday on topic of "Personal Development". Pt shared that her goal is to "calm down, stop jumping to conclusions" as pt came to nursing station angry about her word puzzle being missing earlier and realized she had misplaced it and pt was very apologetic.   Carmell Austria T 09/21/2014, 10:36 AM

## 2014-09-21 NOTE — Progress Notes (Signed)
Recreation Therapy Notes  Recreation Therapy Group Note Template    Date: [id number] Time: 3:00 pm Location: Craft Room  Group Topic: Coping Skills  Goal Area(s) Addresses:  Patient will identify things they are grateful for. Patient will identify how being grateful can influence decision making.  Behavioral Response: Attentive, Interactive  Intervention: Grateful Wheel  Activity: Patients were given an "I Am Grateful For" worksheet and instructed to list 2-3 things per each category that they are grateful for.  Education: LRT educated patients on leisure and why it is important to implement it into their schedules.  Education Outcome: Acknowledges education/In group clarification offered   Clinical Observations/Feedback: Patient completed approximately half of the worksheet. Patient listed at least one item per each category. In the middle of the activity, patient worked on her crossword puzzle. Patient contributed to group discussion.    Leonette Monarch, LRT/CTRS 09/21/2014 5:07 PM

## 2014-09-21 NOTE — Progress Notes (Signed)
Patient easily irritated labile ,  when thing don't go her way . Noted to raise her voice  With a grimacing frown when approached about Glucerna .  Appropriate ADL's and personal chores . Appetite good and voice no concerns around sleep. Attending unit programming with participation When not in groups  Patient worked on word search and reading her book . Noted interacting with her peers and staff. Appetite good and voice no other concerns .

## 2014-09-21 NOTE — Plan of Care (Signed)
Problem: Alteration in mood; excessive anxiety as evidenced by: Goal: STG-Pt can identify how behaviors/thoughts can (Patient can identify how behaviors/thoughts can increase and decrease anxiety)  Outcome: Not Progressing Focus on how to resolve anger and hostile behavior.
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
## 2014-09-21 NOTE — BHH Group Notes (Signed)
Adult Psychoeducational Group Note  Date:  09/21/2014 Time:  2:18 PM  Group Topic/Focus:  Emotional Education:   The focus of this group is to discuss what feelings/emotions are, and how they are experienced.  Participation Level:  Active  Participation Quality:  Appropriate and Attentive  Affect:  Appropriate and Excited  Cognitive:  Alert and Appropriate  Insight: Appropriate  Engagement in Group:  Engaged  Modes of Intervention:  Discussion, Socialization and Support  Additional Comments:  Pt shared that she has experienced anger and does not like to experience anger but has for the first time felt supported by the psychiatrist while hospitalized and has coping skills to help with any negative emotions. Pt reports that family thinks she wants to hurt herself but pt states she is happiest she has ever been now because she has set boundaries and kept a safe distance from family members that trigger her negative emotions.   Carmell Austria T 09/21/2014, 2:18 PM

## 2014-09-21 NOTE — BHH Group Notes (Signed)
[place] Group Notes:  (Nursing/MHT/Case Management/Adjunct)  Date:  09/21/2014  Time:  12:47 PM  Type of Therapy:  Group Therapy  Participation Level:  Did Not Attend  Summary of Progress/Problems:  [name] 09/21/2014, 12:47 PM

## 2014-09-22 MED ORDER — POLYVINYL ALCOHOL 1.4 % OP SOLN
1.0000 [drp] | OPHTHALMIC | Status: DC | PRN
  Administered 2014-09-23: 1 [drp] via OPHTHALMIC
  Filled 2014-09-22: qty 15

## 2014-09-22 MED ORDER — VITAMIN D3 25 MCG (1000 UNIT) PO TABS
3000.0000 [IU] | ORAL_TABLET | Freq: Every day | ORAL | Status: DC
  Administered 2014-09-22 – 2014-09-23 (×2): 3000 [IU] via ORAL
  Filled 2014-09-22 (×6): qty 3

## 2014-09-22 NOTE — Tx Team (Signed)
Interdisciplinary Treatment Plan Update (Adult)  Date:  09/22/2014 Time Reviewed:  8:51 AM  Progress in Treatment: Attending groups: Yes. Participating in groups:  Yes. Taking medication as prescribed:  Yes. Tolerating medication:  Yes. Family/Significant othe contact made:  No, will contact:    Patient understands diagnosis:  Yes. Discussing patient identified problems/goals with staff:  Yes. Medical problems stabilized or resolved:  Yes. Denies suicidal/homicidal ideation: Yes. Issues/concerns per patient self-inventory:  Yes, remains tearful, verbalizes regret that she has not followed through with treatment until her current age. Other:  New problem(s) identified: No  Discharge Plan or Barriers: Discharge home with follow up at [facility] in [place] for therapy and medication management  Reason for Continuation of Hospitalization: Depression Medication stabilization  Comments:  Estimated length of stay: Up to 1 days  New goal(s):None  Review of initial/current patient goals per problem list: See Plan of Care    Attendees: Patient:  [name] 5/5/20168:51 AM  Family:   5/5/20168:51 AM  Physician:  Orson Slick 5/5/20168:51 AM  Nursing:    5/5/20168:51 AM  Case Manager:   5/5/20168:51 AM  Counselor:  Dossie Arbour, LCSW 5/5/20168:51 AM  Other: Carmell Austria, LCSW 5/5/20168:51 AM  Other:  Enis Slipper, LCSW 5/5/20168:51 AM  Other:  Beth, LRT 5/5/20168:51 AM  Other:  5/5/20168:51 AM  Other:  5/5/20168:51 AM  Other:  5/5/20168:51 AM  Other:  5/5/20168:51 AM  Other:  5/5/20168:51 AM  Other:  5/5/20168:51 AM  Other:   5/5/20168:51 AM   Scribe for Treatment Team:   August Saucer, 09/22/2014, 8:51 AM   Pt is attending and participating in group. She is more pleasant.  She verbalizes acceptance that she is in need of Mental Health Treatment.  She has a therapeutic relationship with her therapist at [facility].  If less labile and tearful Pt will be discharged by Friday  09/23/2014. Dossie Arbour, LCSW, 09/22/2014, 10:36am

## 2014-09-22 NOTE — BHH Group Notes (Signed)
[facility] Group Notes:  (Nursing/MHT/Case Management/Adjunct)  Date:  09/22/2014  Time:  9:03 AM  Type of Therapy:  Community Meeting   Participation Level:  Active  Participation Quality:  Appropriate and Attentive  Affect:  Appropriate  Cognitive:  Alert  Insight:  Appropriate  Engagement in Group:  Engaged  Modes of Intervention:  Discussion  Summary of Progress/Problems:  Konstance Happel De'Chelle Traeger Sultana 09/22/2014, 9:03 AM
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
## 2014-09-22 NOTE — Progress Notes (Signed)
Patient noted irritable this am shift, noted to get upset  At breakfast argumentative about what she received and and refused to take the metformin. Writer talked with patient  About her needs .able  To redirect patient , whom did take her medication . Patient later came to [employer]  She felt better.  Attended unit programming . Limited interactions with staff Jolene Schimke .  Appetite good .

## 2014-09-22 NOTE — BHH Group Notes (Addendum)
[facility] Group Notes:  (Nursing/MHT/Case Management/Adjunct)  Date:  09/22/2014  Time:  11:46 AM  Type of Therapy:  Group Therapy  Participation Level:  Active  Participation Quality:  Appropriate and Attentive  Affect:  Appropriate  Cognitive:  Appropriate  Insight:  Appropriate  Engagement in Group:  Supportive  Modes of Intervention:  Activity  Summary of Progress/Problems:  [name] 09/22/2014, 11:46 AM

## 2014-09-22 NOTE — Progress Notes (Signed)
[facility] MD Progress Note  09/22/2014 5:53 PM [name]  MRN:  [id number]  Subjective:  [name] recognizes that she was euphoric yesterday. She feels more herself. She is no longer tearful. She is cool and collected. She insists on getting Vit D3 and allegy pill and is rather forcefull about it. She did have an argument with her nurse this am. She denies suicidal or homicidal ideation. There are no psychotic symptoms. She participated in programming some until her back started hurting.   Principal Problem: Bipolar 1 disorder, mixed Diagnosis:   Patient Active Problem List   Diagnosis Date Noted  . Bipolar 1 disorder, mixed [F31.60] 09/20/2014    Priority: High  . Essential hypertension [I10] 09/21/2014  . Hypothyroidism [E03.9] 09/21/2014  . Dyslipidemia [E78.5] 09/21/2014  . Bipolar 1 disorder, mixed, moderate [F31.62] 09/20/2014  . Bipolar disorder, mixed [F31.60] 09/19/2014   Total Time spent with patient: 20 minutes   Past Medical History:  Past Medical History  Diagnosis Date  . Arthritis   . Diabetes mellitus without complication   . Hypertension   . Seasonal allergies   . Hypothyroid   . Hypercholesterolemia     Past Surgical History  Procedure Laterality Date  . Abdominal hysterectomy    . Kidney stone surgery    . Cholecystectomy     Family History: History reviewed. No pertinent family history. Social History:  History  Alcohol Use No     History  Drug Use No    History   Social History  . Marital Status: Divorced    Spouse Name: N/A  . Number of Children: N/A  . Years of Education: N/A   Social History Main Topics  . Smoking status: Former Smoker    Types: Cigarettes  . Smokeless tobacco: Not on file  . Alcohol Use: No  . Drug Use: No  . Sexual Activity: Not Currently   Other Topics Concern  . None   Social History Narrative   Additional History:    Sleep: Good  Appetite:  Good   Assessment:   Musculoskeletal: Strength & Muscle  Tone: within normal limits Gait & Station: normal Patient leans: N/A   Psychiatric Specialty Exam: Physical Exam  Review of Systems  All other systems reviewed and are negative.   Blood pressure 123/80, pulse 75, temperature 98 F (36.7 C), temperature source Oral, resp. rate 20, height 5\' 1"  (1.549 m), weight 72.5 kg (159 lb 13.3 oz), SpO2 95 %.Body mass index is 30.22 kg/(m^2).  General Appearance: Casual  Eye Contact::  Good  Speech:  Clear and Coherent  Volume:  Normal  Mood:  Angry  Affect:  Appropriate  Thought Process:  Coherent  Orientation:  Full (Time, Place, and Person)  Thought Content:  WDL  Suicidal Thoughts:  No  Homicidal Thoughts:  No  Memory:  Immediate;   Fair Recent;   Fair Remote;   Fair  Judgement:  Fair  Insight:  Fair  Psychomotor Activity:  Normal  Concentration:  Fair  Recall:  [employer] [facility]  Language: Fair  Akathisia:  No  Handed:  Right  AIMS (if indicated):     Assets:  Communication Skills Desire for Improvement Financial Resources/Insurance Housing Physical Health Resilience  ADL's:  Intact  Cognition: WNL  Sleep:  Number of Hours: 6.5     Current Medications: Current Facility-Administered Medications  Medication Dose Route Frequency Provider Last Rate Last Dose  . acetaminophen (TYLENOL) tablet 650 mg  650 mg  Oral Q6H PRN Gonzella Lex, MD   650 mg at 09/21/14 0508  . alum & mag hydroxide-simeth (MAALOX/MYLANTA) 200-200-20 MG/5ML suspension 30 mL  30 mL Oral Q4H PRN Gonzella Lex, MD      . buPROPion [facility] SR) 12 hr tablet 100 mg  100 mg Oral Daily Gonzella Lex, MD   100 mg at 09/22/14 0948  . cholecalciferol (VITAMIN D) tablet 3,000 Units  3,000 Units Oral Daily Clovis Fredrickson, MD   3,000 Units at 09/22/14 1311  . clonazePAM (KLONOPIN) tablet 1 mg  1 mg Oral QHS Gonzella Lex, MD   1 mg at 09/21/14 2144  . enalapril (VASOTEC) tablet 10 mg  10 mg Oral BID Gonzella Lex, MD   10 mg at 09/22/14 0948   . levothyroxine (SYNTHROID, LEVOTHROID) tablet 75 mcg  75 mcg Oral BH-q7a Gonzella Lex, MD   75 mcg at 09/22/14 0636  . magnesium hydroxide (MILK OF MAGNESIA) suspension 30 mL  30 mL Oral Daily PRN Gonzella Lex, MD      . metFORMIN (GLUCOPHAGE) tablet 1,000 mg  1,000 mg Oral BID AC & HS Gonzella Lex, MD   1,000 mg at 09/22/14 0810  . olopatadine (PATANOL) 0.1 % ophthalmic solution 1 drop  1 drop Both Eyes BID Gonzella Lex, MD   1 drop at 09/22/14 0948  . polyvinyl alcohol (LIQUIFILM TEARS) 1.4 % ophthalmic solution 1 drop  1 drop Both Eyes PRN Jolanta B Pucilowska, MD      . simvastatin (ZOCOR) tablet 20 mg  20 mg Oral QHS Jolanta B Pucilowska, MD      . traZODone (DESYREL) tablet 100 mg  100 mg Oral QHS Gonzella Lex, MD   100 mg at 09/21/14 2144    Lab Results: No results found for this or any previous visit (from the past 48 hour(s)).  Physical Findings: AIMS: Facial and Oral Movements Muscles of Facial Expression: None, normal Lips and Perioral Area: None, normal Jaw: None, normal Tongue: None, normal,Extremity Movements Upper (arms, wrists, hands, fingers): None, normal Lower (legs, knees, ankles, toes): None, normal, Trunk Movements Neck, shoulders, hips: None, normal, Overall Severity Severity of abnormal movements (highest score from questions above): None, normal Incapacitation due to abnormal movements: None, normal Patient's awareness of abnormal movements (rate only patient's report): No Awareness, Dental Status Current problems with teeth and/or dentures?: No Does patient usually wear dentures?: Yes  CIWA:    COWS:     Treatment Plan Summary: Daily contact with patient to assess and evaluate symptoms and progress in treatment and Medication management   Medical Decision Making:  New problem, with additional work up planned, Review of Psycho-Social Stressors (1) and Review of Medication Regimen & Side Effects (2)   1. Suicidal ideation. The patient denies and  is able to contract for safety.  2. Mood. We continue Wellbutrin for depression. The patient refuses any medication adjustments including addition of mood stabilizer      3. Anxiety. We continued 1 mg of Clonazepam at bedtime as in the community.  4. Insomnia. She is on Trazodone.  5. Dyslipidemia. She is on Zocor.  6. Hypothyroidism. We continue Synthroid.  7. HTN. Vasptec.  8. Diabetes. She is on metformin.  9. Disposition. She will be discharged to home. She will follow up with [facility].    Orson Slick 09/22/2014, 5:53 PM

## 2014-09-22 NOTE — Progress Notes (Signed)
Recreation Therapy Notes  Date: 05.05.16 Time: 3:00 pm Location: Craft Room  Group Topic: Leisure Education  Goal Area(s) Addresses:  Increase leisure awareness.  Behavioral Response: Did not attend  Intervention: Leisure Time  Activity: Patients were instructed to write two healthy leisure activities on slips of paper. Patients were then given a Leisure Time Clock worksheet and instructed to fill out the worksheet. Patients were instructed to list 10 positive emotions. Patient matched their leisure activities to the positive emotions.  Education: Patient did not attend group.  Education Outcome: Patient did not attend group.  Clinical Observations/Feedback: Patient did not attend group.    Leonette Monarch, LRT/CTRS 09/22/2014 5:10 PM

## 2014-09-22 NOTE — Plan of Care (Signed)
Problem: Alteration in mood Goal: LTG-Patient reports reduction in suicidal thoughts (Patient reports reduction in suicidal thoughts and is able to verbalize a safety plan for whenever patient is feeling suicidal)  Outcome: Progressing Patient denies any thoughts of suicide.

## 2014-09-23 DIAGNOSIS — E1169 Type 2 diabetes mellitus with other specified complication: Secondary | ICD-10-CM

## 2014-09-23 DIAGNOSIS — E119 Type 2 diabetes mellitus without complications: Secondary | ICD-10-CM

## 2014-09-23 DIAGNOSIS — E785 Hyperlipidemia, unspecified: Secondary | ICD-10-CM

## 2014-09-23 MED ORDER — BUPROPION HCL ER (SR) 100 MG PO TB12: 100.0000 mg | ORAL_TABLET | Freq: Every day | ORAL | Status: DC

## 2014-09-23 MED ORDER — LEVOCETIRIZINE DIHYDROCHLORIDE 5 MG PO TABS: 5.0000 mg | ORAL_TABLET | Freq: Every evening | ORAL | Status: DC

## 2014-09-23 MED ORDER — METFORMIN HCL 1000 MG PO TABS: 1000.0000 mg | ORAL_TABLET | Freq: Two times a day (BID) | ORAL | Status: DC

## 2014-09-23 MED ORDER — ENALAPRIL MALEATE 20 MG PO TABS: 20.0000 mg | ORAL_TABLET | Freq: Two times a day (BID) | ORAL | Status: DC

## 2014-09-23 MED ORDER — LEVOTHYROXINE SODIUM 75 MCG PO TABS: 75.0000 ug | ORAL_TABLET | ORAL | Status: DC

## 2014-09-23 MED ORDER — GLUCERNA SHAKE PO LIQD
237.0000 mL | Freq: Three times a day (TID) | ORAL | Status: DC
  Administered 2014-09-23: 237 mL via ORAL

## 2014-09-23 MED ORDER — CLONAZEPAM 1 MG PO TABS: 1.0000 mg | ORAL_TABLET | Freq: Every day | ORAL | Status: DC

## 2014-09-23 MED ORDER — SIMVASTATIN 20 MG PO TABS: 20.0000 mg | ORAL_TABLET | Freq: Every day | ORAL | Status: DC

## 2014-09-23 MED ORDER — VITAMIN D3 25 MCG (1000 UNIT) PO TABS: 3000.0000 [IU] | ORAL_TABLET | Freq: Every day | ORAL | Status: DC

## 2014-09-23 MED ORDER — TRAZODONE HCL 100 MG PO TABS: 150.0000 mg | ORAL_TABLET | Freq: Every day | ORAL | Status: DC

## 2014-09-23 NOTE — BHH Group Notes (Signed)
Adult Psychoeducational Group Note  Date:  09/23/2014 Time:  2:17 PM  Group Topic/Focus:  Relapse Prevention Planning:   The focus of this group is to define relapse and discuss the need for planning to combat relapse.  Participation Level:  Did Not Attend  Participation Quality:  n/a  Affect:  n/a  Cognitive:  n/a  Insight: n/a  Engagement in Group:  n/a  Modes of Intervention:  n/a  Additional Comments:  Pt did not attend group.  Carmell Austria T 09/23/2014, 2:17 PM

## 2014-09-23 NOTE — Discharge Instructions (Signed)
Take medications as directed. Follow up with all outpatient appointments. If in crisis: Call [place] [facility] [phone]

## 2014-09-23 NOTE — Plan of Care (Signed)
Problem: Consults Goal: Depression Patient Education See Patient Education Module for education specifics.  Outcome: Progressing Denies SI  Problem: Alteration in mood Goal: LTG-Patient reports reduction in suicidal thoughts (Patient reports reduction in suicidal thoughts and is able to verbalize a safety plan for whenever patient is feeling suicidal)  Outcome: Progressing Denies SI Goal: STG-Patient is able to discuss feelings and issues (Patient is able to discuss feelings and issues leading to depression)  Outcome: Not Progressing Patient is agitated and notes she will not participate in any assessment process after her bed time. She also refused medications.  Problem: Alteration in mood; excessive anxiety as evidenced by: Goal: STG-Pt can identify coping skills to manage panic/anxiety (Patient can identify at least ____ coping skills to manage panic/anxiety attack)  Outcome: Progressing Patient states she has to go to be at 9pm  Goal: STG-Pt will report an absence of self-harm thoughts/actions (Patient will report an absence of self-harm thoughts or actions)  Outcome: Progressing No self harm  Problem: Ineffective individual coping Goal: STG: Pt will be able to identify effective and ineffective STG: Pt will be able to identify effective and ineffective coping patterns  Outcome: Not Progressing Unable to interact appropriately with staff

## 2014-09-23 NOTE — Discharge Summary (Signed)
Physician Discharge Summary Note  Patient:  [name] is an 69 y.o., female MRN:  [id number] DOB:  1946-05-15 Patient phone:  [phone] (home)  Patient address:   [address] [place] [address],  Total Time spent with patient: 30 minutes  Date of Admission:  09/20/2014 Date of Discharge: 09/23/2014  Reason for Admission:  Mixed bipolar episode.  Principal Problem: Bipolar I disorder, current or most recent episode manic, severe with mixed features Discharge Diagnoses: Patient Active Problem List   Diagnosis Date Noted  . Bipolar I disorder, current or most recent episode manic, severe with mixed features [F31.13] 09/20/2014    Priority: High  . Diabetes [E11.9] 09/23/2014  . Essential hypertension [I10] 09/21/2014  . Hypothyroidism [E03.9] 09/21/2014  . Dyslipidemia [E78.5] 09/21/2014  . Bipolar 1 disorder, mixed, moderate [F31.62] 09/20/2014  . Bipolar disorder, mixed [F31.60] 09/19/2014    Musculoskeletal: Strength & Muscle Tone: within normal limits Gait & Station: normal Patient leans: N/A  Psychiatric Specialty Exam: Physical Exam  Nursing note and vitals reviewed.   Review of Systems  All other systems reviewed and are negative.   Blood pressure 145/98, pulse 82, temperature 98.4 F (36.9 C), temperature source Oral, resp. rate 20, height 5\' 1"  (1.549 m), weight 72.5 kg (159 lb 13.3 oz), SpO2 95 %.Body mass index is 30.22 kg/(m^2).  See SRA                                                  Sleep:  Number of Hours: 7.25   Have you used any form of tobacco in the last 30 days? (Cigarettes, Smokeless Tobacco, Cigars, and/or Pipes): No  Has this patient used any form of tobacco in the last 30 days? (Cigarettes, Smokeless Tobacco, Cigars, and/or Pipes) No  Past Medical History:  Past Medical History  Diagnosis Date  . Arthritis   . Diabetes mellitus without complication   . Hypertension   . Seasonal allergies   .  Hypothyroid   . Hypercholesterolemia     Past Surgical History  Procedure Laterality Date  . Abdominal hysterectomy    . Kidney stone surgery    . Cholecystectomy     Family History: History reviewed. No pertinent family history. Social History:  History  Alcohol Use No     History  Drug Use No    History   Social History  . Marital Status: Divorced    Spouse Name: N/A  . Number of Children: N/A  . Years of Education: N/A   Social History Main Topics  . Smoking status: Former Smoker    Types: Cigarettes  . Smokeless tobacco: Not on file  . Alcohol Use: No  . Drug Use: No  . Sexual Activity: Not Currently   Other Topics Concern  . None   Social History Narrative    Past Psychiatric History: Hospitalizations:  Outpatient Care:  Substance Abuse Care:  Self-Mutilation:  Suicidal Attempts:  Violent Behaviors:   Risk to Self: Is patient at risk for suicide?: No What has been your use of drugs/alcohol within the last 12 months?: n/a Risk to Others:   Prior Inpatient Therapy:   Prior Outpatient Therapy:    Level of Care:  OP  Hospital Course:    1. Suicidal ideation. This has resolved. The patient is able to contract for safety.  2. Mood. We continue Wellbutrin for depression. The patient refuses any medication adjustments including addition of mood stabilizer   3. Anxiety. We continued 1 mg of Clonazepam at bedtime as in the community.  4. Insomnia. She is on Trazodone.  5. Dyslipidemia. She is on Zocor.  6. Hypothyroidism. We continue Synthroid.  7. HTN. Vasptec.  8. Diabetes. She is on metformin.  9. Disposition. She was discharged to home. She will follow up with [facility].    Consults:  None  Significant Diagnostic Studies:  None  Discharge Vitals:   Blood pressure 145/98, pulse 82, temperature 98.4 F (36.9 C), temperature source Oral, resp. rate 20, height 5\' 1"  (1.549 m), weight 72.5 kg (159 lb 13.3 oz), SpO2 95 %. Body mass index is  30.22 kg/(m^2). Lab Results:   No results found for this or any previous visit (from the past 72 hour(s)).  Physical Findings: AIMS: Facial and Oral Movements Muscles of Facial Expression: None, normal Lips and Perioral Area: None, normal Jaw: None, normal Tongue: None, normal,Extremity Movements Upper (arms, wrists, hands, fingers): None, normal Lower (legs, knees, ankles, toes): None, normal, Trunk Movements Neck, shoulders, hips: None, normal, Overall Severity Severity of abnormal movements (highest score from questions above): None, normal Incapacitation due to abnormal movements: None, normal Patient's awareness of abnormal movements (rate only patient's report): No Awareness, Dental Status Current problems with teeth and/or dentures?: No Does patient usually wear dentures?: Yes  CIWA:    COWS:      See Psychiatric Specialty Exam and Suicide Risk Assessment completed by Attending Physician prior to discharge.  Discharge destination:  Home  Is patient on multiple antipsychotic therapies at discharge:  No   Has Patient had three or more failed trials of antipsychotic monotherapy by history:  No    Recommended Plan for Multiple Antipsychotic Therapies: NA  Discharge Instructions    Diet - low sodium heart healthy    Complete by:  As directed      Diet - low sodium heart healthy    Complete by:  As directed      Increase activity slowly    Complete by:  As directed      Increase activity slowly    Complete by:  As directed             Medication List    STOP taking these medications        Olopatadine HCl 0.2 % Soln      TAKE these medications      Indication   buPROPion 100 MG 12 hr tablet  Commonly known as:  WELLBUTRIN SR  Take 1 tablet (100 mg total) by mouth daily.   Indication:  Depressive Phase of Manic-Depression     cholecalciferol 1000 UNITS tablet  Commonly known as:  VITAMIN D  Take 3 tablets (3,000 Units total) by mouth daily.    Indication:  Osteoporosis     clonazePAM 1 MG tablet  Commonly known as:  KLONOPIN  Take 1 tablet (1 mg total) by mouth at bedtime.   Indication:  Manic-Depression     enalapril 20 MG tablet  Commonly known as:  VASOTEC  Take 1 tablet (20 mg total) by mouth 2 (two) times daily.   Indication:  High Blood Pressure     levocetirizine 5 MG tablet  Commonly known as:  XYZAL  Take 1 tablet (5 mg total) by mouth every evening.   Indication:  Perennial Rhinitis     levothyroxine 75 MCG tablet  Commonly known as:  SYNTHROID, LEVOTHROID  Take 1 tablet (75 mcg total) by mouth every morning.   Indication:  Underactive Thyroid     levothyroxine 75 MCG tablet  Commonly known as:  SYNTHROID, LEVOTHROID  Take 1 tablet (75 mcg total) by mouth every morning.      metFORMIN 1000 MG tablet  Commonly known as:  GLUCOPHAGE  Take 1 tablet (1,000 mg total) by mouth 2 (two) times daily.   Indication:  Type 2 Diabetes     simvastatin 20 MG tablet  Commonly known as:  ZOCOR  Take 1 tablet (20 mg total) by mouth daily.   Indication:  Cerebrovascular Accident or Stroke     traZODone 100 MG tablet  Commonly known as:  DESYREL  Take 1.5 tablets (150 mg total) by mouth at bedtime.   Indication:  Trouble Sleeping           Follow-up Information    Follow up with [facility]. Go in 1 week.   Why:  Hospital Follow up   Contact information:   [place] [address] [phone]; [phone]      Follow-up recommendations:  Activity:  as tolerated Diet:  ADA low sodium  Other:  keep appointment with your psychiatrist.  Comments:    Total Discharge Time: 32  Signed: Orson Slick 09/23/2014, 9:35 PM

## 2014-09-23 NOTE — BHH Group Notes (Signed)
[facility] LCSW Group Therapy  09/23/2014 9:15 AM  Type of Therapy:  Psychoeducational Skills  Participation Level:  Active  Participation Quality:  Attentive and Intrusive  Affect:  Anxious  Cognitive:  Alert and Disorganized  Insight:  Defensive and Lacking  Engagement in Therapy:  Defensive and Distracting  Modes of Intervention:  Clarification, Confrontation and Discussion  Summary of Progress/Problems: Pt attended group, introduced self, and shared that her 'superpower' would be "cleaning, listening to music, and patience but I don't have that anymore". Pt left group irritable as pt reports that the group chairs are hard and have caused her back to hurt and pt states "you put these chairs in here so we don't want to stay any longer than we have to". Pt was distracting and explained to ask the nurse for a cushion to address pt's discomfort with chair, however, pt left and did not return 15 minutes into group.  Carmell Austria T 09/23/2014, 9:15 AM

## 2014-09-23 NOTE — Outcomes Assessment (Signed)
Patient continues to be irritable. She is present in the [facility], but not interactive with her peers. She refused medications after coming to the nurse's station stating 9pm was her bed time and she would not do anything but go to bed after 9pm. She does deny SI, HI, and AVH. She notes no other needs or distress.

## 2014-09-23 NOTE — Progress Notes (Signed)
  [facility] Adult Case Management Discharge Plan :  Will you be returning to the same living situation after discharge:  Yes,  her own apartment At discharge, do you have transportation home?: No. Do you have the ability to pay for your medications: Yes,  Medicare  Release of information consent forms completed and in the chart;  Patient's signature needed at discharge.  Patient to Follow up at: Follow-up Information    Follow up with [facility]. Go in 1 week.   Why:  Hospital Follow up   Contact information:   [place] [address] [phone]; [phone]      Patient denies SI/HI: Yes,       Safety Planning and Suicide Prevention discussed: Yes,  With Pt, Did not consent for family contact  Have you used any form of tobacco in the last 30 days? (Cigarettes, Smokeless Tobacco, Cigars, and/or Pipes): No  Has patient been referred to the Quitline?: N/A patient is not a smoker  August Saucer 09/23/2014, 1:29 PM
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
## 2014-09-23 NOTE — BHH Group Notes (Signed)
[facility] Group Notes:  (Nursing/MHT/Case Management/Adjunct)  Date:  09/23/2014  Time:  2:14 PM  Type of Therapy:  Group Therapy  Participation Level:  Active  Participation Quality:  Appropriate  Affect:  Appropriate  Cognitive:  Alert and Appropriate  Insight:  Appropriate  Engagement in Group:  Engaged  Modes of Intervention:  Activity  Summary of Progress/Problems:  [name] [name] 09/23/2014, 2:14 PM

## 2014-09-23 NOTE — Progress Notes (Signed)
Patient discharged ambulatory to self via taxi service arranged by [facility]. She denies SI or HI. Discharge instructions reviewed with patient, she verbalizes understanding. Patient received prescriptions, all personal belongings, and valuables.

## 2014-09-23 NOTE — BHH Group Notes (Signed)
[facility] LCSW Aftercare Discharge Planning Group Note  09/23/2014 11:24 AM  Participation Quality:  Appropriate and Attentive  Affect:  Anxious and Appropriate  Cognitive:  Alert and Appropriate  Insight:  Improving  Engagement in Group:  Improving  Modes of Intervention:  Education and Socialization  Summary of Progress/Problems: Pt reports that her SMART goal is to "work on going home by cooperating with treatment". Pt states that she is "a better person today with no horns" as pt was irrational and angry yesterday during group and feels that she is improved today.   Carmell Austria T 09/23/2014, 11:24 AM

## 2015-02-15 ENCOUNTER — Encounter: Payer: Self-pay | Admitting: *Deleted

## 2015-02-15 ENCOUNTER — Emergency Department: Payer: Medicare Other

## 2015-02-15 ENCOUNTER — Emergency Department
Admission: EM | Admit: 2015-02-15 | Discharge: 2015-02-15 | Disposition: A | Discharge status: Home / Self Care | Payer: Medicare Other | Attending: Emergency Medicine | Admitting: Emergency Medicine

## 2015-02-15 DIAGNOSIS — N939 Abnormal uterine and vaginal bleeding, unspecified: Secondary | ICD-10-CM | POA: Insufficient documentation

## 2015-02-15 DIAGNOSIS — I10 Essential (primary) hypertension: Secondary | ICD-10-CM | POA: Diagnosis not present

## 2015-02-15 DIAGNOSIS — Z79899 Other long term (current) drug therapy: Secondary | ICD-10-CM | POA: Insufficient documentation

## 2015-02-15 DIAGNOSIS — Z880 Allergy status to penicillin: Secondary | ICD-10-CM | POA: Diagnosis not present

## 2015-02-15 DIAGNOSIS — R1032 Left lower quadrant pain: Secondary | ICD-10-CM | POA: Diagnosis not present

## 2015-02-15 DIAGNOSIS — E119 Type 2 diabetes mellitus without complications: Secondary | ICD-10-CM | POA: Insufficient documentation

## 2015-02-15 DIAGNOSIS — Z87891 Personal history of nicotine dependence: Secondary | ICD-10-CM | POA: Diagnosis not present

## 2015-02-15 LAB — COMPREHENSIVE METABOLIC PANEL
ALBUMIN: 4.1 g/dL (ref 3.5–5.0)
ALT: 10 U/L — AB (ref 14–54)
AST: 26 U/L (ref 15–41)
Alkaline Phosphatase: 42 U/L (ref 38–126)
Anion gap: 11 (ref 5–15)
BUN: 17 mg/dL (ref 6–20)
CHLORIDE: 105 mmol/L (ref 101–111)
CO2: 23 mmol/L (ref 22–32)
Calcium: 9.6 mg/dL (ref 8.9–10.3)
Creatinine, Ser: 1.03 mg/dL — ABNORMAL HIGH (ref 0.44–1.00)
GFR calc non Af Amer: 54 mL/min — ABNORMAL LOW (ref >60)
Glucose, Bld: 117 mg/dL — ABNORMAL HIGH (ref 65–99)
Potassium: 3.8 mmol/L (ref 3.5–5.1)
Sodium: 139 mmol/L (ref 135–145)
Total Bilirubin: 0.5 mg/dL (ref 0.3–1.2)
Total Protein: 6.8 g/dL (ref 6.5–8.1)

## 2015-02-15 LAB — CBC
HCT: 39.8 % (ref 35.0–47.0)
HEMOGLOBIN: 13.4 g/dL (ref 12.0–16.0)
MCH: 28.7 pg (ref 26.0–34.0)
MCHC: 33.5 g/dL (ref 32.0–36.0)
MCV: 85.5 fL (ref 80.0–100.0)
PLATELETS: 286 10*3/uL (ref 150–440)
RBC: 4.66 MIL/uL (ref 3.80–5.20)
RDW: 12.7 % (ref 11.5–14.5)
WBC: 9.5 10*3/uL (ref 3.6–11.0)

## 2015-02-15 LAB — URINALYSIS COMPLETE WITH MICROSCOPIC (ARMC ONLY)
Bacteria, UA: NONE SEEN
Bilirubin Urine: NEGATIVE
Glucose, UA: NEGATIVE mg/dL
Hgb urine dipstick: NEGATIVE
Ketones, ur: NEGATIVE mg/dL
Nitrite: NEGATIVE
PROTEIN: NEGATIVE mg/dL
SPECIFIC GRAVITY, URINE: 1.004 — AB (ref 1.005–1.030)
pH: 5 (ref 5.0–8.0)

## 2015-02-15 LAB — LIPASE, BLOOD: Lipase: 29 U/L (ref 22–51)

## 2015-02-15 IMAGING — CR DG ABDOMEN 2V
1 series · 3 of 3 positions shown · non-contrast
Comparison: None.

CLINICAL DATA: Left lower quadrant pain for 4 weeks.

EXAM:
ABDOMEN - 2 VIEW

[Series 1: dg abd 2 views · 0.14mm/px · 3 of 3 slices shown]
[im 1/3]
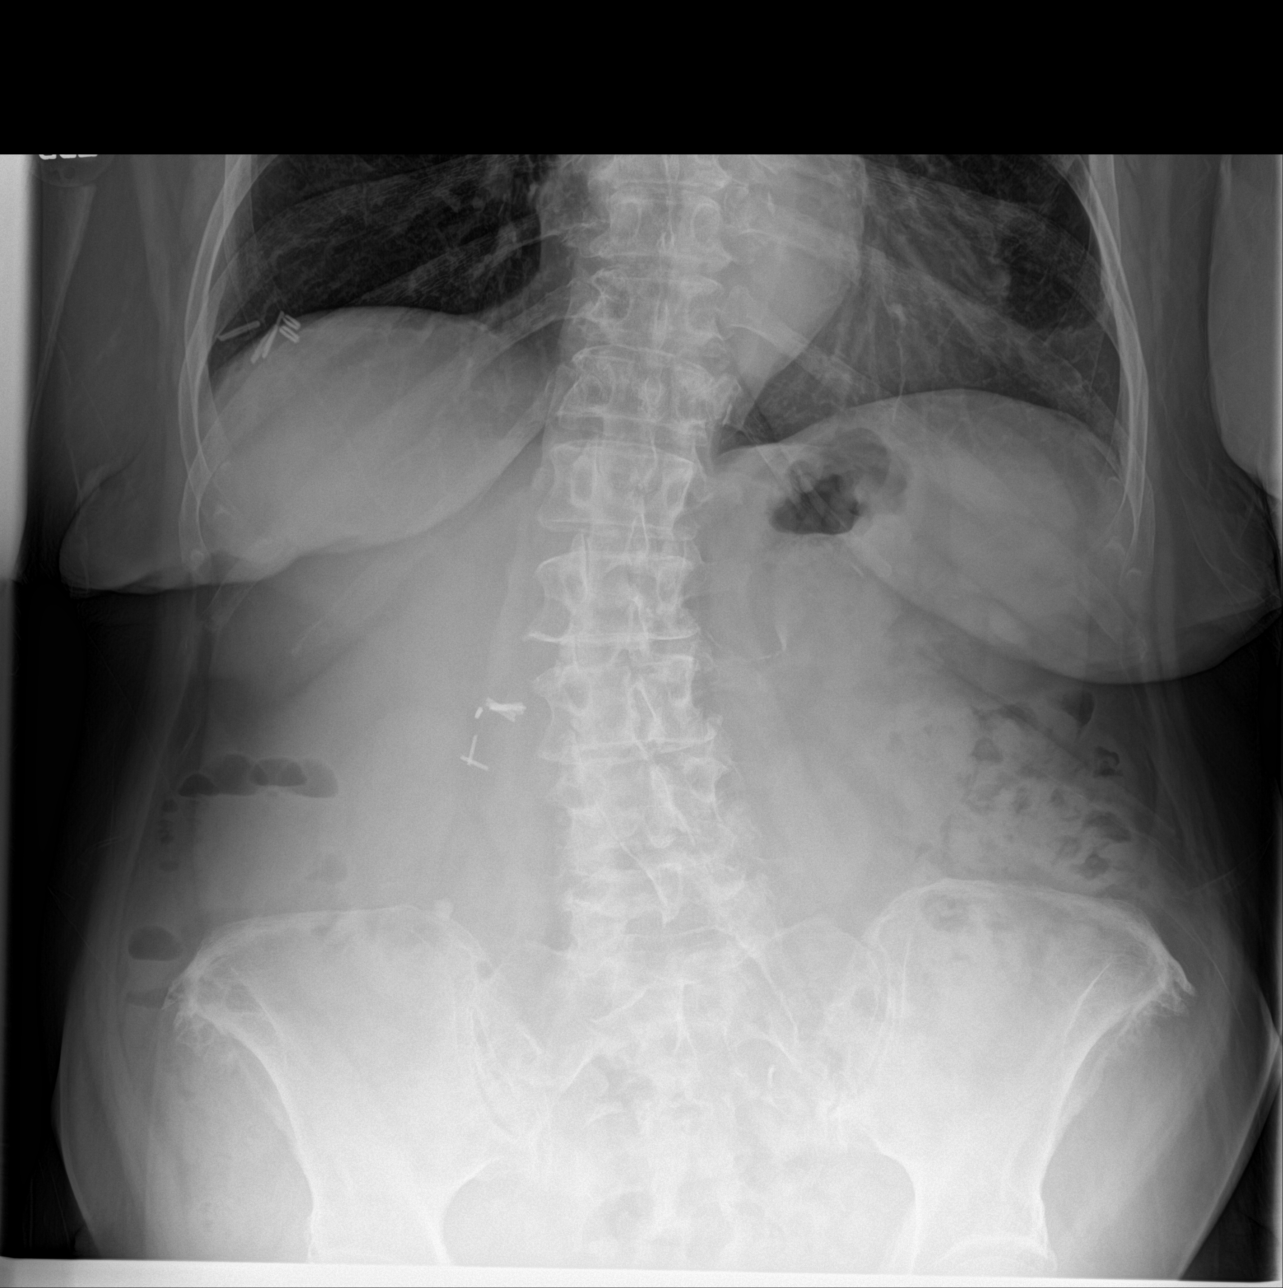
[im 2/3]
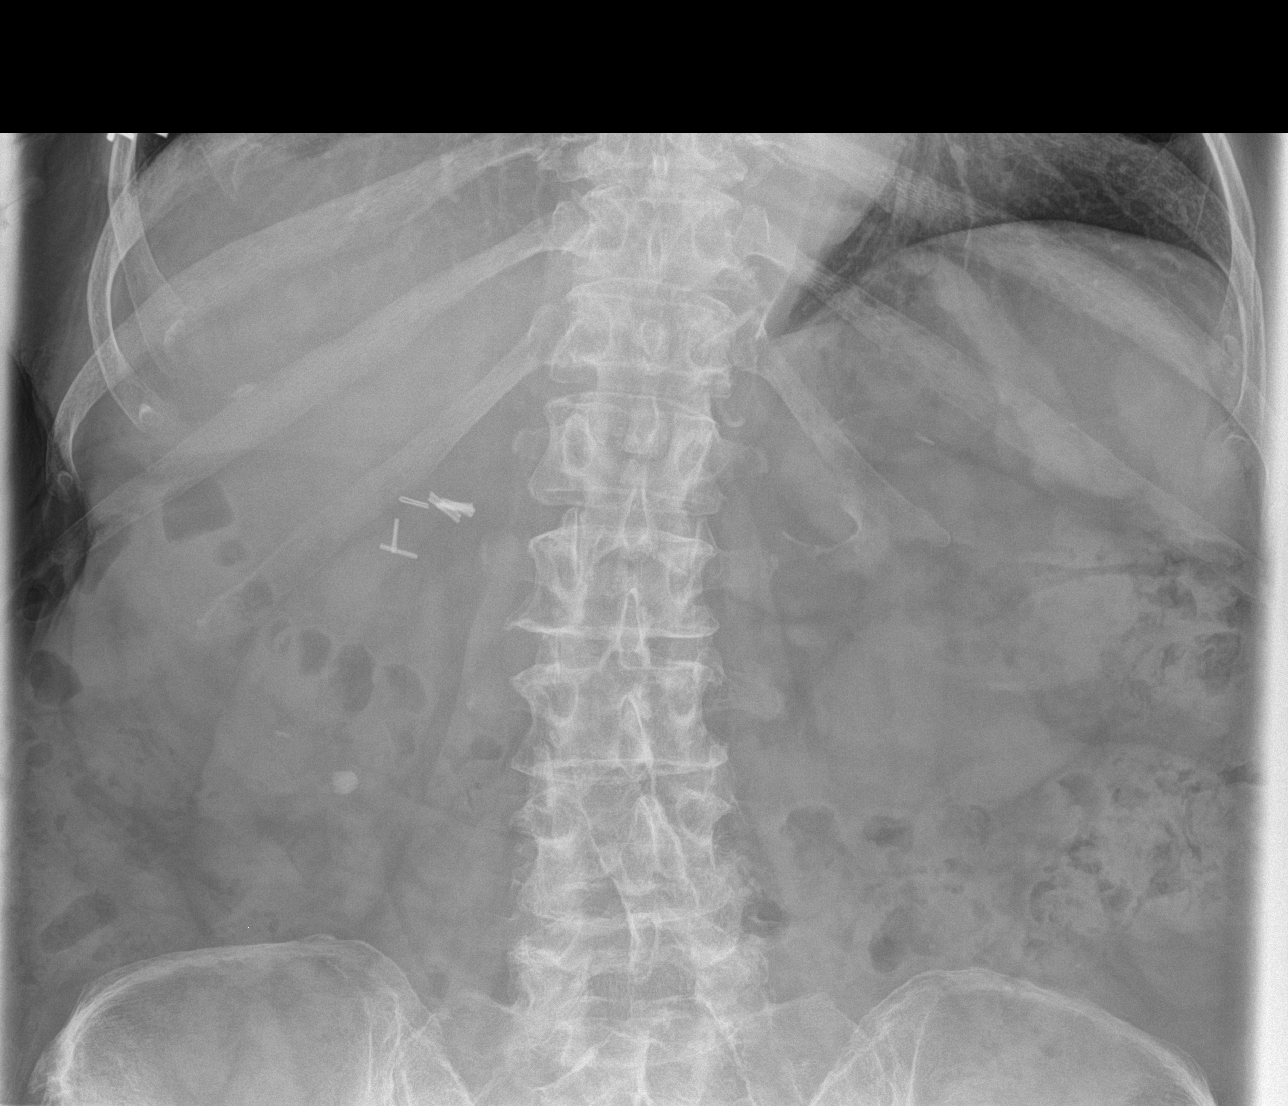
[im 3/3]
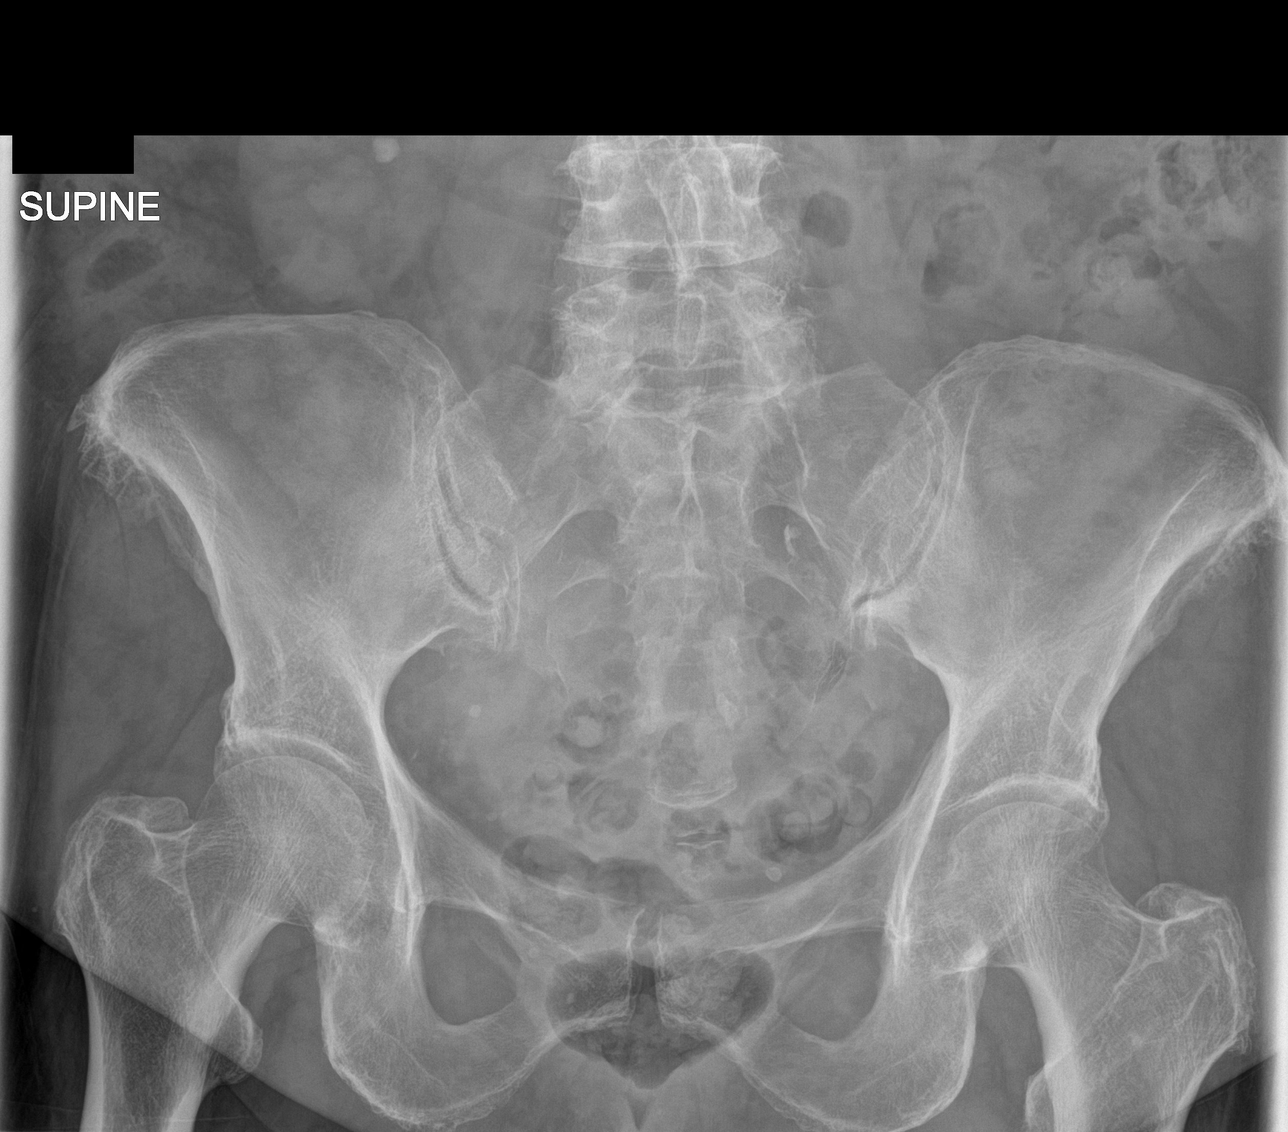

[3 of 3 positions shown; findings below may reference images not displayed]

FINDINGS: The bowel gas pattern is normal. There is no evidence of free air.
Two radiodensities are seen overlying the midpole of the right
kidney, largest measuring 7 mm, consistent with renal calculi. No
other definite opaque urinary tract calculi identified. Several
pelvic phleboliths noted.
IMPRESSION: Normal bowel gas pattern.

Right sided nephrolithiasis.

## 2015-02-15 MED ORDER — CIPROFLOXACIN HCL 500 MG PO TABS: 500.0000 mg | ORAL_TABLET | Freq: Two times a day (BID) | ORAL | Status: DC

## 2015-02-15 MED ORDER — METRONIDAZOLE 500 MG PO TABS: 500.0000 mg | ORAL_TABLET | Freq: Three times a day (TID) | ORAL | Status: DC

## 2015-02-15 NOTE — Discharge Instructions (Signed)
Follow-up with [facility] Friday as planned. Please come back to the emergency room right away if you develop a fever, severe persistent pain, feel weak or pass out, have blood in your stool, vomiting, or other new concerns arise.  Abdominal Pain Many things can cause abdominal pain. Usually, abdominal pain is not caused by a disease and will improve without treatment. It can often be observed and treated at home. Your health care provider will do a physical exam and possibly order blood tests and X-rays to help determine the seriousness of your pain. However, in many cases, more time must pass before a clear cause of the pain can be found. Before that point, your health care provider may not know if you need more testing or further treatment. HOME CARE INSTRUCTIONS  Monitor your abdominal pain for any changes. The following actions may help to alleviate any discomfort you are experiencing:  Only take over-the-counter or prescription medicines as directed by your health care provider.  Do not take laxatives unless directed to do so by your health care provider.  Try a clear liquid diet (broth, tea, or water) as directed by your health care provider. Slowly move to a bland diet as tolerated. SEEK MEDICAL CARE IF:  You have unexplained abdominal pain.  You have abdominal pain associated with nausea or diarrhea.  You have pain when you urinate or have a bowel movement.  You experience abdominal pain that wakes you in the night.  You have abdominal pain that is worsened or improved by eating food.  You have abdominal pain that is worsened with eating fatty foods.  You have a fever. SEEK IMMEDIATE MEDICAL CARE IF:   Your pain does not go away within 2 hours.  You keep throwing up (vomiting).  Your pain is felt only in portions of the abdomen, such as the right side or the left lower portion of the abdomen.  You pass bloody or black tarry stools. MAKE SURE  YOU:  Understand these instructions.   Will watch your condition.   Will get help right away if you are not doing well or get worse.  Document Released: 02/13/2005 Document Revised: 05/11/2013 Document Reviewed: 01/13/2013 [facility] Patient Information 2015 [place], [place]. This information is not intended to replace advice given to you by your health care provider. Make sure you discuss any questions you have with your health care provider.

## 2015-02-15 NOTE — ED Notes (Signed)
Pt denies blood per rectum, nor dark tarry stools.

## 2015-02-15 NOTE — ED Provider Notes (Signed)
[facility] Emergency Department Provider Note REMINDER - THIS NOTE IS NOT A FINAL MEDICAL RECORD UNTIL IT IS SIGNED. UNTIL THEN, THE CONTENT BELOW MAY REFLECT INFORMATION FROM A DOCUMENTATION TEMPLATE, NOT THE ACTUAL PATIENT VISIT. ____________________________________________  Time seen: Approximately 9:06 PM  I have reviewed the triage vital signs and the nursing notes.   HISTORY  Chief Complaint Abdominal Pain    HPI [name] is a 69 y.o. female reports a history of diabetes and hypertension. She comes for evaluation of lower abdominal pain which she has been having occasionally off-and-on for the last month. She reports that for the last month she's been having occasional cramps in the left lower abdomen. They're not severe, she describes as mild and that it comes and goes. No fever or chills, no nausea or vomiting, no diarrhea or constipation. Overall she states that she feels quite well and she is comfortable at this time. She's been eating normally. No pain in her back, no change in urination. She had a vaginal discharge or bleeding. She has a previous hysterectomy.  Patient reports she is quite comfortable and does not wish for any medication. She has a primary care doctor appointment set up for Friday for follow-up regarding this discomfort she's been having with [facility]. .  Past Medical History  Diagnosis Date  . Arthritis   . Diabetes mellitus without complication   . Hypertension   . Seasonal allergies   . Hypothyroid   . Hypercholesterolemia     Patient Active Problem List   Diagnosis Date Noted  . Diabetes 09/23/2014  . Bipolar 1 disorder, mixed   . Essential hypertension 09/21/2014  . Hypothyroidism 09/21/2014  . Dyslipidemia 09/21/2014  . Bipolar 1 disorder, mixed, moderate 09/20/2014  . Bipolar I disorder, current or most recent episode manic, severe with mixed features 09/20/2014  . Bipolar disorder, mixed  09/19/2014    Past Surgical History  Procedure Laterality Date  . Abdominal hysterectomy    . Kidney stone surgery    . Cholecystectomy      Current Outpatient Rx  Name  Route  Sig  Dispense  Refill  . buPROPion (WELLBUTRIN SR) 100 MG 12 hr tablet   Oral   Take 1 tablet (100 mg total) by mouth daily.   30 tablet   0   . cholecalciferol (VITAMIN D) 1000 UNITS tablet   Oral   Take 3 tablets (3,000 Units total) by mouth daily.   30 tablet   0   . clonazePAM (KLONOPIN) 1 MG tablet   Oral   Take 1 tablet (1 mg total) by mouth at bedtime.   30 tablet   0   . enalapril (VASOTEC) 20 MG tablet   Oral   Take 1 tablet (20 mg total) by mouth 2 (two) times daily.   30 tablet   0   . levocetirizine (XYZAL) 5 MG tablet   Oral   Take 1 tablet (5 mg total) by mouth every evening.   30 tablet   0   . levothyroxine (SYNTHROID, LEVOTHROID) 75 MCG tablet   Oral   Take 1 tablet (75 mcg total) by mouth every morning.   30 tablet   0   . levothyroxine (SYNTHROID, LEVOTHROID) 75 MCG tablet   Oral   Take 1 tablet (75 mcg total) by mouth every morning.   30 tablet   0   . metFORMIN (GLUCOPHAGE) 1000 MG tablet   Oral   Take 1  tablet (1,000 mg total) by mouth 2 (two) times daily.   60 tablet   0   . simvastatin (ZOCOR) 20 MG tablet   Oral   Take 1 tablet (20 mg total) by mouth daily.   30 tablet   0   . traZODone (DESYREL) 100 MG tablet   Oral   Take 1.5 tablets (150 mg total) by mouth at bedtime.   45 tablet   0     Allergies Navane and Penicillins  History reviewed. No pertinent family history.  Social History Social History  Substance Use Topics  . Smoking status: Former Smoker    Types: Cigarettes  . Smokeless tobacco: Never Used  . Alcohol Use: No    Review of Systems Constitutional: No fever/chills Eyes: No visual changes. ENT: No sore throat. Cardiovascular: Denies chest pain. Respiratory: Denies shortness of breath. Gastrointestinal: No nausea,  no vomiting.  No diarrhea.  No constipation. Genitourinary: Negative for dysuria. Musculoskeletal: Negative for back pain. Skin: Negative for rash. Neurological: Negative for headaches, focal weakness or numbness.  10-point ROS otherwise negative.  ____________________________________________   PHYSICAL EXAM:  VITAL SIGNS: ED Triage Vitals  Enc Vitals Group     BP 02/15/15 2033 135/65 mmHg     Pulse Rate 02/15/15 2033 88     Resp 02/15/15 2033 16     Temp 02/15/15 2033 98.3 F (36.8 C)     Temp Source 02/15/15 2033 Oral     SpO2 02/15/15 2033 98 %     Weight 02/15/15 2033 149 lb (67.586 kg)     Height 02/15/15 2033 5\' 2"  (1.575 m)     Head Cir --      Peak Flow --      Pain Score 02/15/15 2034 3     Pain Loc --      Pain Edu? --      Excl. in [place]? --    Constitutional: Alert and oriented. Well appearing and in no acute distress. Eyes: Conjunctivae are normal. PERRL. EOMI. Head: Atraumatic. Nose: No congestion/rhinnorhea. Mouth/Throat: Mucous membranes are moist.  Oropharynx non-erythematous. Neck: No stridor.   Cardiovascular: Normal rate, regular rhythm. Grossly normal heart sounds.  Good peripheral circulation. Respiratory: Normal respiratory effort.  No retractions. Lungs CTAB. Gastrointestinal: Soft and nontender suffer some very minimal discomfort to deep palpation of the left lower quadrant without rebound or guarding. No distention. No abdominal bruits. No CVA tenderness. There is no pulsatile or midline mass. Musculoskeletal: No lower extremity tenderness nor edema.  No joint effusions. Neurologic:  Normal speech and language. No gross focal neurologic deficits are appreciated. No gait instability. Skin:  Skin is warm, dry and intact. No rash noted. Psychiatric: Mood and affect are normal. Speech and behavior are normal.  ____________________________________________   LABS (all labs ordered are listed, but only abnormal results are displayed)  Labs Reviewed   CBC  COMPREHENSIVE METABOLIC PANEL  LIPASE, BLOOD  URINALYSIS COMPLETEWITH MICROSCOPIC ([place])   ____________________________________________  EKG   ____________________________________________  RADIOLOGY  Abdominal radiograph pending ____________________________________________   PROCEDURES  Procedure(s) performed: None  Critical Care performed: No  ____________________________________________   INITIAL IMPRESSION / ASSESSMENT AND PLAN / ED COURSE  Pertinent labs & imaging results that were available during my care of the patient were reviewed by me and considered in my medical decision making (see chart for details).  Patient presents with intermittent abdominal ache in the left lower quadrant for the last 3-4 weeks. She states that she didn't  having ongoing aching this evening, but denies that it is severe or significantly painful. Her abdominal exam is very reassuring without any surgical or peritoneal signs.  No exam findings or history to suggest perforation, aneurysmal rupture, abscess, or significant intra-abdominal infection. No cardiopulmonary symptoms.  Do not believe the patient has any significant findings on exam at this time, and she has close primary care follow-up. I discussed with the patient the risks and benefits of CT scanning including evaluation for mass, aneurysm, tumor, infection, and after discussing the risks though very low of IV dye as well as radiation versus the benefits and her current examination via shared medical decision making we have decided to review her x-ray, and labs. If she has a significant lab abnormalities and will proceed with CT, but otherwise the patient is comfortable with plan to follow-up with her doctor Friday if her symptoms are ongoing. Because she doesn't left lower quadrant discomfort I will give her prescription for Cipro and Flagyl for what might be some very mild diverticulitis.  Discussed with Dr. Archie Balboa who  will follow-up on essentially all labs, x-ray, and repeat abdominal examination. If all appears well, plan be to discharge the patient with close follow-up on Friday and place her on Cipro and Flagyl for a week for treatment of what may be some very mild colitis or diverticular symptoms. ____________________________________________   FINAL CLINICAL IMPRESSION(S) / ED DIAGNOSES  Final diagnoses:  Left lower quadrant pain      Delman Kitten, MD 02/15/15 2124

## 2015-02-15 NOTE — ED Notes (Signed)
Pt has had LLQ pain for 3-4 weeks, called PCP today, has appt Friday, but pt told PCP that if her got worse she was calling an ambulance.   Pt here no apparent distress.

## 2015-02-15 NOTE — ED Notes (Signed)
Patient transported to X-ray 

## 2015-02-27 ENCOUNTER — Ambulatory Visit: Payer: Self-pay | Admitting: Obstetrics and Gynecology

## 2015-05-25 ENCOUNTER — Other Ambulatory Visit: Payer: Self-pay | Admitting: Family Medicine

## 2015-05-25 DIAGNOSIS — Z1231 Encounter for screening mammogram for malignant neoplasm of breast: Secondary | ICD-10-CM

## 2015-06-27 ENCOUNTER — Encounter: Payer: Self-pay | Admitting: *Deleted

## 2015-07-31 ENCOUNTER — Ambulatory Visit
Admission: RE | Admit: 2015-07-31 | Discharge: 2015-07-31 | Disposition: A | Discharge status: Home / Self Care | Payer: Medicare Other | Source: Ambulatory Visit | Attending: Family Medicine | Admitting: Family Medicine

## 2015-07-31 ENCOUNTER — Other Ambulatory Visit: Payer: Self-pay | Admitting: Family Medicine

## 2015-07-31 DIAGNOSIS — Z1231 Encounter for screening mammogram for malignant neoplasm of breast: Secondary | ICD-10-CM | POA: Insufficient documentation

## 2015-07-31 IMAGING — MG MM DIGITAL SCREENING BILAT W/ TOMO W/ CAD
8 of 13 series · 8 of 29 positions shown · non-contrast
Comparison: Previous exam(s).

CLINICAL DATA: Screening.

EXAM:
DIGITAL SCREENING BILATERAL MAMMOGRAM WITH 3D TOMO WITH CAD

[L XCCL]
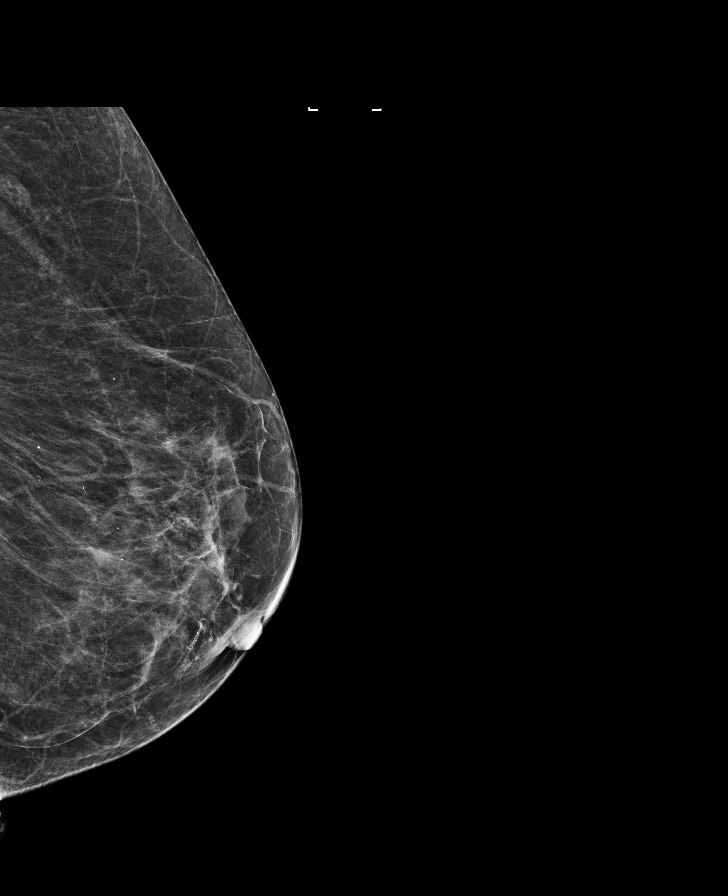

[L MLO synth-2D]
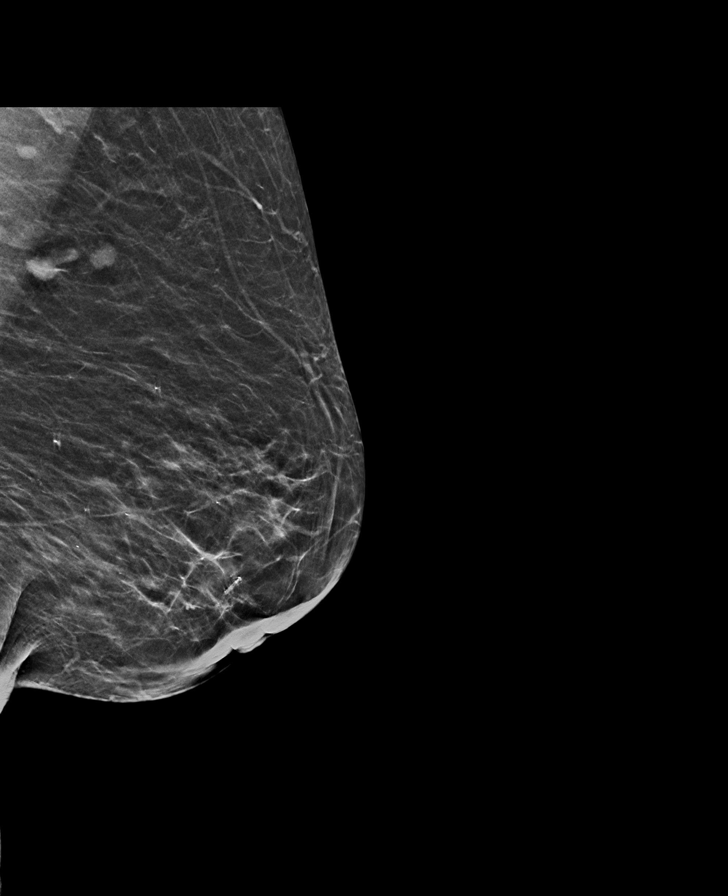

[L MLO]
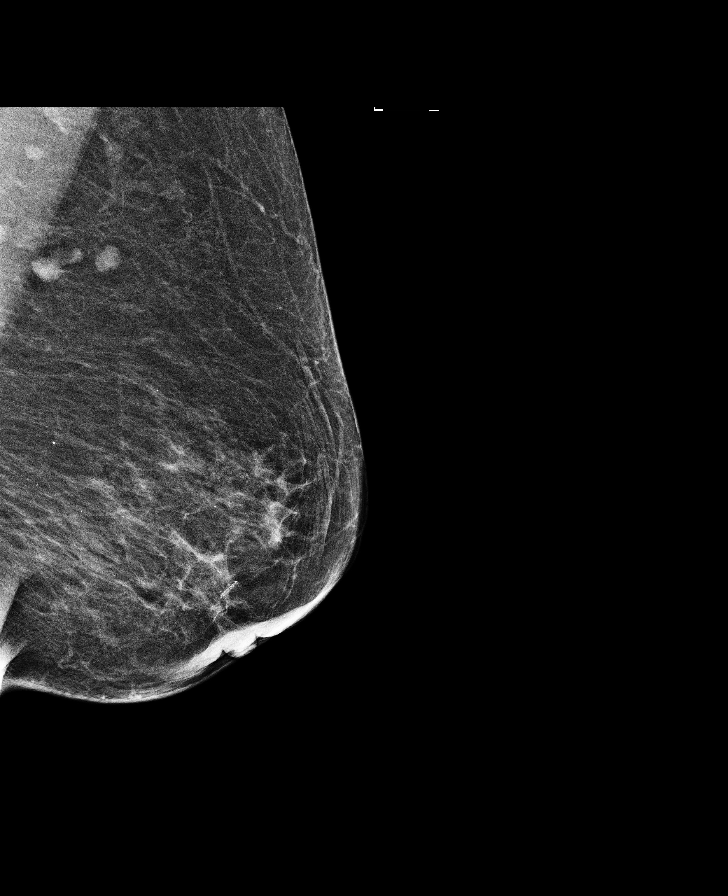

[R MLO synth-2D]
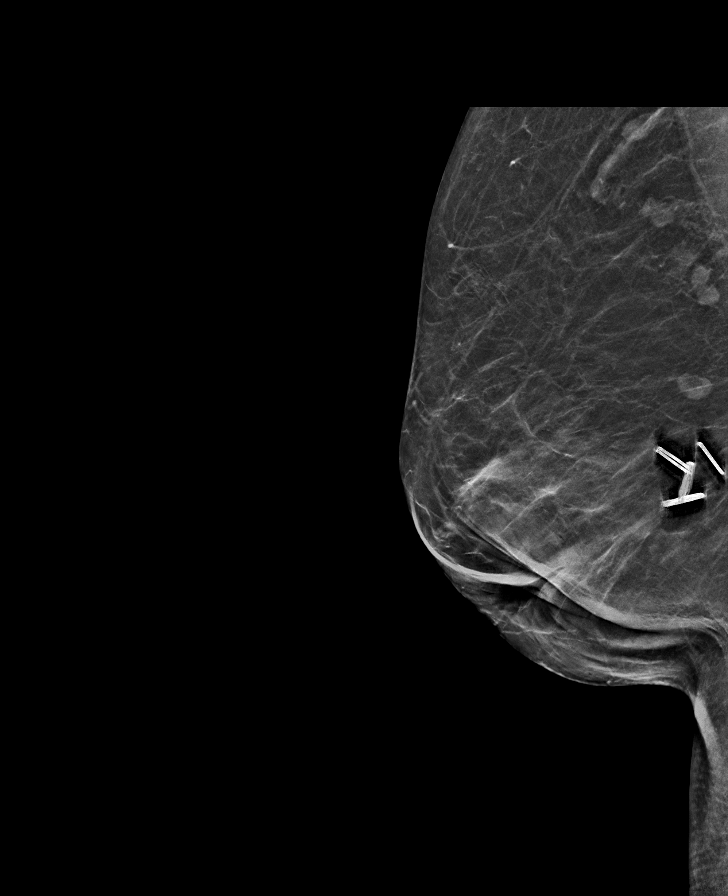

[L CC]
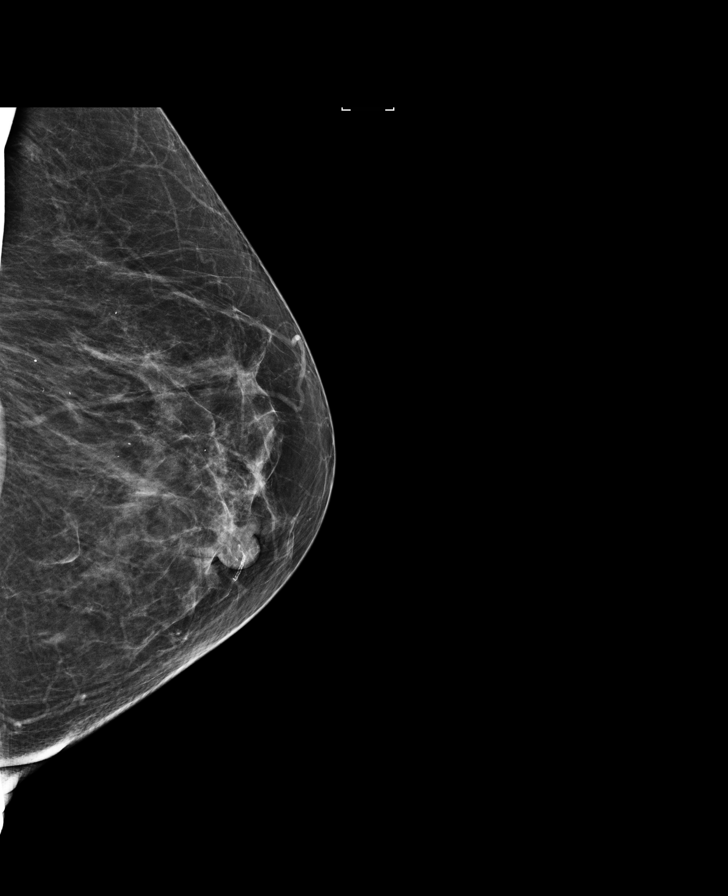

[L CC synth-2D]
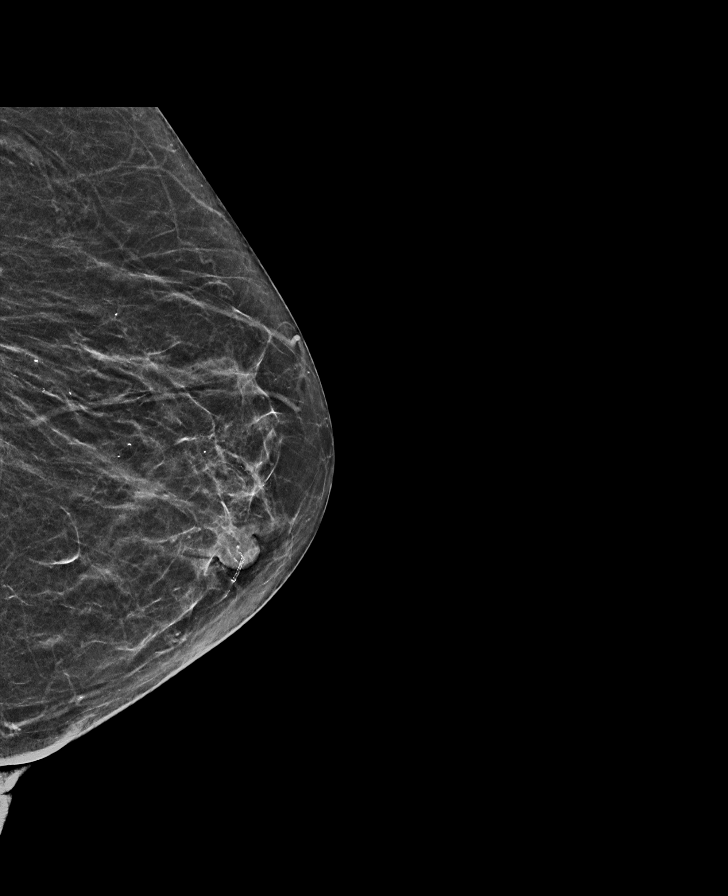

[R CC synth-2D]
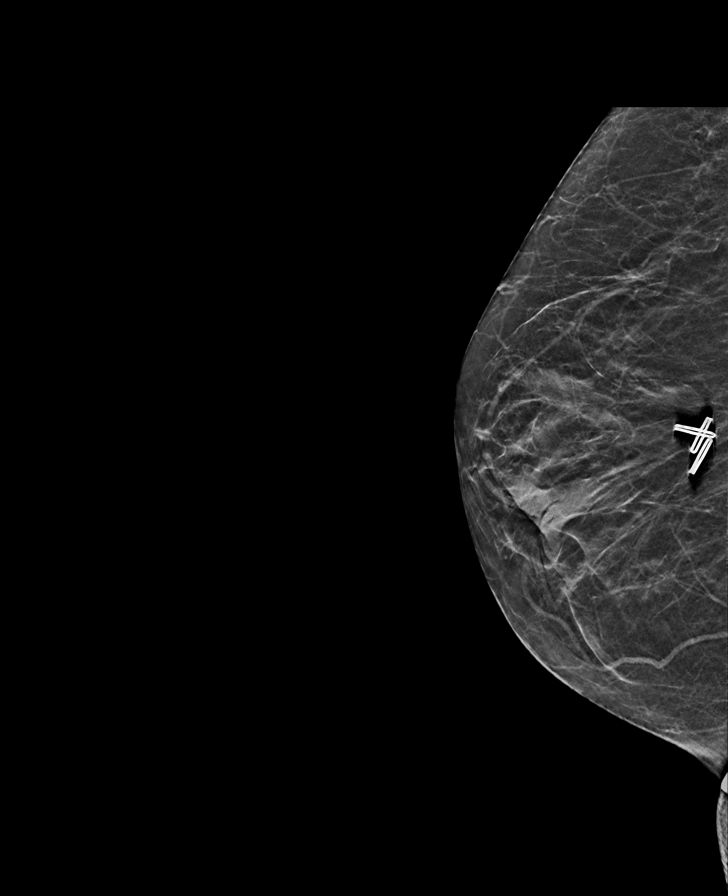

[R CC]
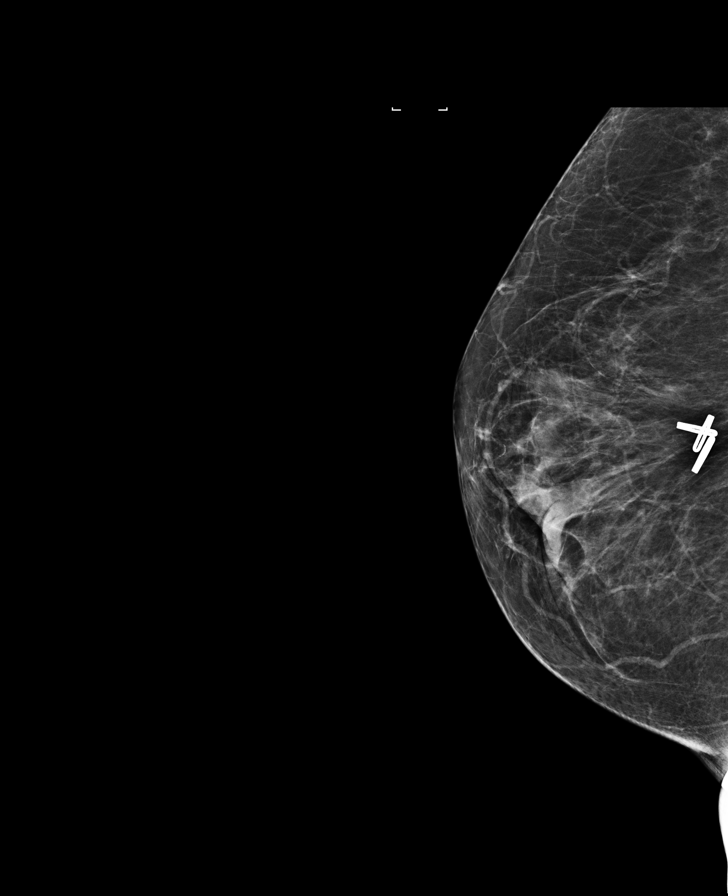

[8 of 29 positions shown; findings below may reference images not displayed]

ACR Breast Density Category b: There are scattered areas of
fibroglandular density.
FINDINGS: There are no findings suspicious for malignancy. Images were
processed with CAD.
IMPRESSION: No mammographic evidence of malignancy. A result letter of this
screening mammogram will be mailed directly to the patient.

RECOMMENDATION:
Screening mammogram in one year. (Code:[F0])

BI-RADS CATEGORY  1: Negative.

## 2016-01-18 ENCOUNTER — Emergency Department: Payer: Medicare Other

## 2016-01-18 ENCOUNTER — Emergency Department
Admission: EM | Admit: 2016-01-18 | Discharge: 2016-01-18 | Disposition: A | Discharge status: Home / Self Care | Payer: Medicare Other | Attending: Emergency Medicine | Admitting: Emergency Medicine

## 2016-01-18 ENCOUNTER — Encounter: Payer: Self-pay | Admitting: Emergency Medicine

## 2016-01-18 DIAGNOSIS — E039 Hypothyroidism, unspecified: Secondary | ICD-10-CM | POA: Insufficient documentation

## 2016-01-18 DIAGNOSIS — Z79899 Other long term (current) drug therapy: Secondary | ICD-10-CM | POA: Diagnosis not present

## 2016-01-18 DIAGNOSIS — M79602 Pain in left arm: Secondary | ICD-10-CM

## 2016-01-18 DIAGNOSIS — Z87891 Personal history of nicotine dependence: Secondary | ICD-10-CM | POA: Insufficient documentation

## 2016-01-18 DIAGNOSIS — Z7984 Long term (current) use of oral hypoglycemic drugs: Secondary | ICD-10-CM | POA: Insufficient documentation

## 2016-01-18 DIAGNOSIS — I10 Essential (primary) hypertension: Secondary | ICD-10-CM | POA: Diagnosis not present

## 2016-01-18 DIAGNOSIS — E119 Type 2 diabetes mellitus without complications: Secondary | ICD-10-CM | POA: Diagnosis not present

## 2016-01-18 LAB — COMPREHENSIVE METABOLIC PANEL
ALBUMIN: 4.5 g/dL (ref 3.5–5.0)
ALK PHOS: 33 U/L — AB (ref 38–126)
ALT: 12 U/L — AB (ref 14–54)
ANION GAP: 9 (ref 5–15)
AST: 18 U/L (ref 15–41)
BILIRUBIN TOTAL: 0.5 mg/dL (ref 0.3–1.2)
BUN: 17 mg/dL (ref 6–20)
CALCIUM: 9.7 mg/dL (ref 8.9–10.3)
CO2: 23 mmol/L (ref 22–32)
CREATININE: 1.04 mg/dL — AB (ref 0.44–1.00)
Chloride: 105 mmol/L (ref 101–111)
GFR calc non Af Amer: 53 mL/min — ABNORMAL LOW (ref >60)
GLUCOSE: 128 mg/dL — AB (ref 65–99)
Potassium: 4 mmol/L (ref 3.5–5.1)
Sodium: 137 mmol/L (ref 135–145)
TOTAL PROTEIN: 7.4 g/dL (ref 6.5–8.1)

## 2016-01-18 LAB — CBC
HEMATOCRIT: 40 % (ref 35.0–47.0)
HEMOGLOBIN: 14.1 g/dL (ref 12.0–16.0)
MCH: 30.4 pg (ref 26.0–34.0)
MCHC: 35.1 g/dL (ref 32.0–36.0)
MCV: 86.6 fL (ref 80.0–100.0)
Platelets: 295 10*3/uL (ref 150–440)
RBC: 4.63 MIL/uL (ref 3.80–5.20)
RDW: 13.2 % (ref 11.5–14.5)
WBC: 7.3 10*3/uL (ref 3.6–11.0)

## 2016-01-18 LAB — TROPONIN I: Troponin I: <0.03 ng/mL (ref <0.03)

## 2016-01-18 IMAGING — DX DG CHEST 1V PORT
1 series · 1 of 1 positions shown · non-contrast
Comparison: Portable chest [DATE].

CLINICAL DATA: 70-year-old female with chest and left arm pain.
Initial encounter.

EXAM:
PORTABLE CHEST 1 VIEW

[chest ap]
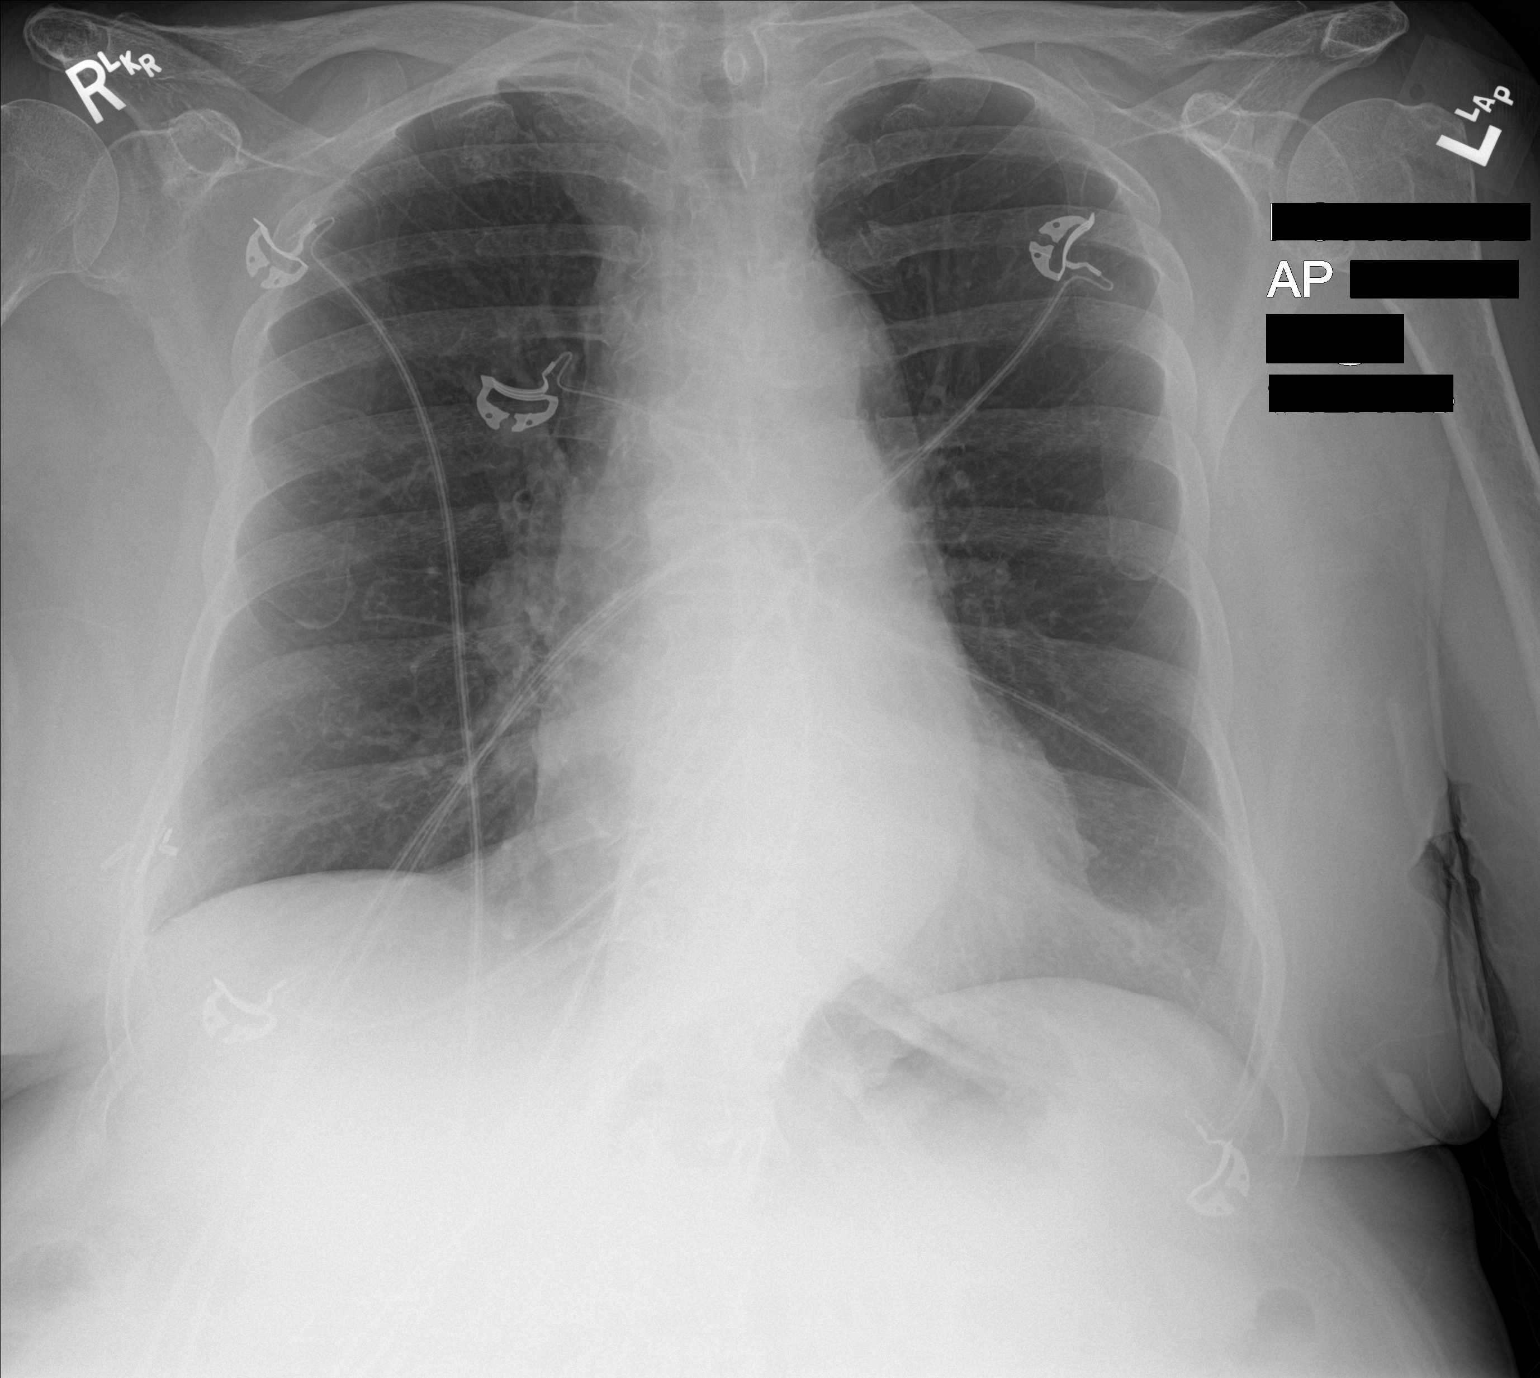

[1 of 1 positions shown; findings below may reference images not displayed]

FINDINGS: Portable AP upright view at [UA] hours. Stable mild to moderate
tortuosity of the thoracic aorta. Calcified aortic atherosclerosis.
Stable cardiac size at the upper limits of normal. Other mediastinal
contours are within normal limits. Visualized tracheal air column is
within normal limits. Allowing for portable technique, the lungs are
clear. Stable right chest wall surgical clips. No pneumothorax.
IMPRESSION: No acute cardiopulmonary abnormality.

Calcified aortic atherosclerosis.

## 2016-01-18 IMAGING — DX DG HUMERUS 2V *L*
2 series · 2 of 2 positions shown · non-contrast
Comparison: None.

CLINICAL DATA: Pain

EXAM:
LEFT HUMERUS - 2+ VIEW

[humerus ap]
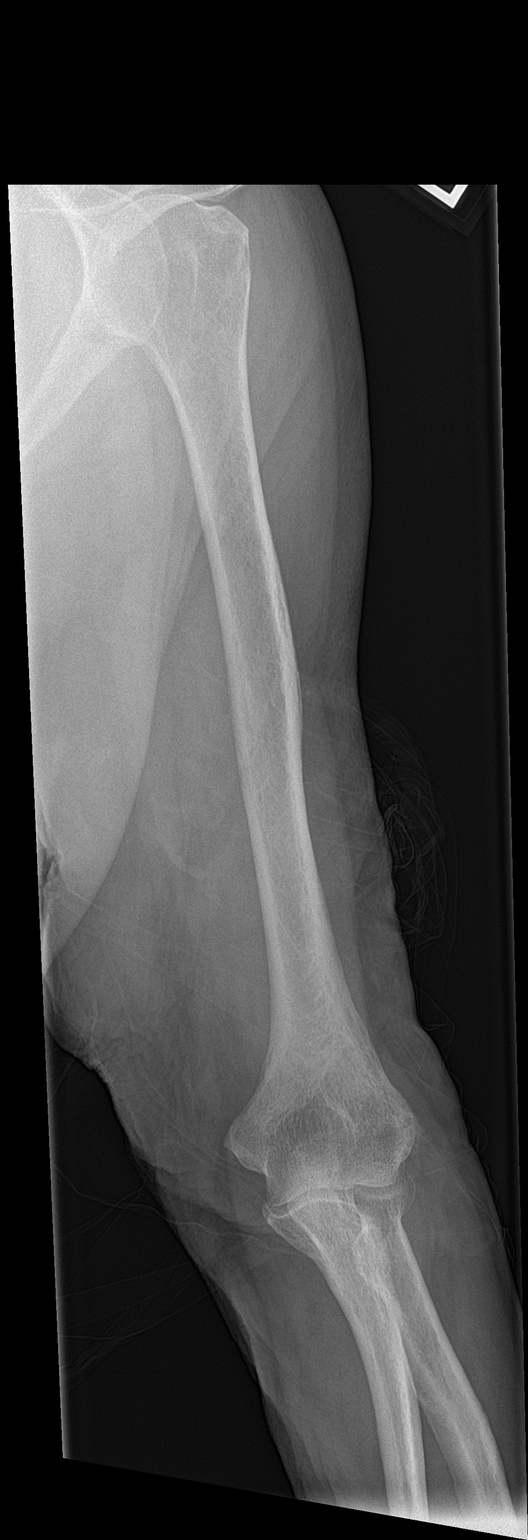

[humerus lat]
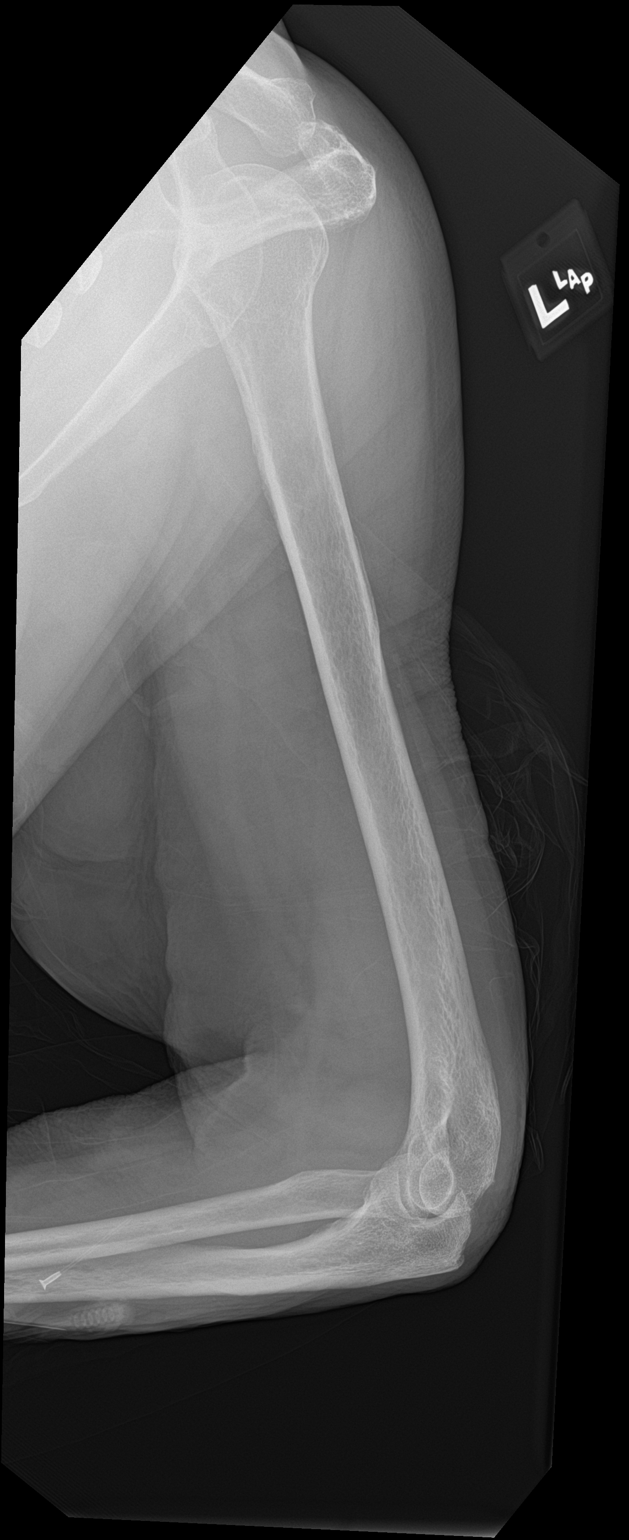

[2 of 2 positions shown; findings below may reference images not displayed]

FINDINGS: Frontal and lateral views were obtained. There is no fracture or
dislocation. Joint spaces appear normal. No abnormal periosteal
reaction.
IMPRESSION: No fracture or dislocation.  No evident arthropathy.

## 2016-01-18 MED ORDER — OXYCODONE-ACETAMINOPHEN 5-325 MG PO TABS: 2.0000 | ORAL_TABLET | Freq: Four times a day (QID) | ORAL | 0 refills | Status: DC | PRN

## 2016-01-18 MED ORDER — ASPIRIN 81 MG PO TBEC: 81.0000 mg | DELAYED_RELEASE_TABLET | Freq: Every day | ORAL | 12 refills | Status: DC

## 2016-01-18 MED ORDER — OXYCODONE-ACETAMINOPHEN 5-325 MG PO TABS
2.0000 | ORAL_TABLET | Freq: Once | ORAL | Status: AC
  Administered 2016-01-18: 2 via ORAL
  Filled 2016-01-18: qty 2

## 2016-01-18 NOTE — ED Triage Notes (Signed)
Pt to ed with c/o left arm pain since Thursday last week.  Pt denies injury, denies chest pain, denies sob.  Denies neck pain.

## 2016-01-18 NOTE — ED Notes (Signed)
Patient is dressed, sitting on the bench awaiting discharge.

## 2016-01-18 NOTE — ED Notes (Signed)
X-ray at bedside

## 2016-01-18 NOTE — ED Provider Notes (Signed)
[facility] Emergency Department Provider Note        Time seen: ----------------------------------------- 9:02 AM on 01/18/2016 -----------------------------------------    I have reviewed the triage vital signs and the nursing notes.   HISTORY  Chief Complaint Arm Pain    HPI [name] is a 70 y.o. female who presents to ER for left arm pain since Thursday last week. Patient denies any injury, denies fevers, chills, chest pain, shortness of breath, vomiting or diarrhea. Patient states left arm pain is new for her, she denies other complaints.   Past Medical History:  Diagnosis Date  . Arthritis   . Diabetes mellitus without complication ([place])   . Hypercholesterolemia   . Hypertension   . Hypothyroid   . Seasonal allergies     Patient Active Problem List   Diagnosis Date Noted  . Diabetes ([place]) 09/23/2014  . Bipolar 1 disorder, mixed ([place])   . Essential hypertension 09/21/2014  . Hypothyroidism 09/21/2014  . Dyslipidemia 09/21/2014  . Bipolar 1 disorder, mixed, moderate ([place]) 09/20/2014  . Bipolar I disorder, current or most recent episode manic, severe with mixed features ([place]) 09/20/2014  . Bipolar disorder, mixed ([place]) 09/19/2014    Past Surgical History:  Procedure Laterality Date  . ABDOMINAL HYSTERECTOMY    . BREAST BIOPSY Left    neg  . BREAST EXCISIONAL BIOPSY Right 2004   breast ca radation  . CHOLECYSTECTOMY    . KIDNEY STONE SURGERY      Allergies Navane [thiothixene] and Penicillins  Social History Social History  Substance Use Topics  . Smoking status: Former Smoker    Types: Cigarettes  . Smokeless tobacco: Never Used  . Alcohol use No    Review of Systems Constitutional: Negative for fever. Cardiovascular: Negative for chest pain. Respiratory: Negative for shortness of breath. Gastrointestinal: Negative for abdominal pain, vomiting and diarrhea. Genitourinary: Negative for dysuria. Musculoskeletal:  Positive left arm pain Skin: Negative for rash. Neurological: Negative for headaches, focal weakness or numbness.  10-point ROS otherwise negative.  ____________________________________________   PHYSICAL EXAM:  VITAL SIGNS: ED Triage Vitals [01/18/16 0852]  Enc Vitals Group     BP      Pulse      Resp      Temp      Temp src      SpO2      Weight 149 lb (67.6 kg)     Height 5\' 1"  (1.549 m)     Head Circumference      Peak Flow      Pain Score 9     Pain Loc      Pain Edu?      Excl. in [place]?     Constitutional: Alert and oriented. Well appearing and in no distress. Eyes: Conjunctivae are normal. PERRL. Normal extraocular movements. ENT   Head: Normocephalic and atraumatic.   Nose: No congestion/rhinnorhea.   Mouth/Throat: Mucous membranes are moist.   Neck: No stridor. Cardiovascular: Normal rate, regular rhythm. No murmurs, rubs, or gallops. Respiratory: Normal respiratory effort without tachypnea nor retractions. Breath sounds are clear and equal bilaterally. No wheezes/rales/rhonchi. Gastrointestinal: Soft and nontender. Normal bowel sounds Musculoskeletal: Nontender with normal range of motion in all extremities. No lower extremity tenderness nor edema. Neurologic:  Normal speech and language. No gross focal neurologic deficits are appreciated.  Skin:  Skin is warm, dry and intact. No rash noted. Psychiatric: Mood and affect are normal. Speech and behavior are normal.  ____________________________________________  EKG:  Interpreted by me.Normal sinus rhythm with a rate of 71 bpm, normal PR interval, normal QRS, normal QT interval, ST depressions inferiorly and laterally  ____________________________________________  ED COURSE:  Pertinent labs & imaging results that were available during my care of the patient were reviewed by me and considered in my medical decision making (see chart for details). Clinical Course  Patient is in no distress, EKG is  abnormal, I do not think the left arm pain is anginal equivalent but we will check basic labs..  Procedures ____________________________________________   LABS (pertinent positives/negatives)  Labs Reviewed  COMPREHENSIVE METABOLIC PANEL - Abnormal; Notable for the following:       Result Value   Glucose, Bld 128 (*)    Creatinine, Ser 1.04 (*)    ALT 12 (*)    Alkaline Phosphatase 33 (*)    GFR calc non Af Amer 53 (*)    All other components within normal limits  CBC  TROPONIN I    RADIOLOGY  Chest x-ray, left humerus x-ray Are unremarkable ____________________________________________  FINAL ASSESSMENT AND PLAN  Left arm pain  Plan: Patient with labs and imaging as dictated above. Patient's no distress, she had dressed herself and states she was leaving prior to my final evaluation. I will refer her to cardiology for outpatient follow-up. She does have EKG changes compared to prior EKG but no lab abnormalities. She is stable for outpatient follow-up.   Earleen Newport, MD   Note: This dictation was prepared with Dragon dictation. Any transcriptional errors that result from this process are unintentional    Earleen Newport, MD 01/18/16 1131

## 2016-01-18 NOTE — ED Notes (Signed)
MD notified pt wants to leave

## 2016-01-26 ENCOUNTER — Ambulatory Visit: Payer: Self-pay | Admitting: Internal Medicine

## 2016-02-06 ENCOUNTER — Ambulatory Visit: Payer: Self-pay | Admitting: Internal Medicine

## 2016-03-14 ENCOUNTER — Ambulatory Visit (INDEPENDENT_AMBULATORY_CARE_PROVIDER_SITE_OTHER): Payer: Medicare Other | Admitting: Cardiology

## 2016-03-14 ENCOUNTER — Encounter: Payer: Self-pay | Admitting: Cardiology

## 2016-03-14 VITALS — BP 138/84 | HR 88 | Ht 61.0 in | Wt 146.5 lb

## 2016-03-14 DIAGNOSIS — R9431 Abnormal electrocardiogram [ECG] [EKG]: Secondary | ICD-10-CM

## 2016-03-14 DIAGNOSIS — M79602 Pain in left arm: Secondary | ICD-10-CM | POA: Diagnosis not present

## 2016-03-14 DIAGNOSIS — I10 Essential (primary) hypertension: Secondary | ICD-10-CM | POA: Diagnosis not present

## 2016-03-14 NOTE — Progress Notes (Signed)
Cardiology Office Note   Date:  03/14/2016   ID:  [name], DOB 05-29-1945, MRN [id number]  Referring Doctor:  Casilda Carls, MD   Cardiologist:   Wende Bushy, MD   Reason for consultation:  Chief Complaint  Patient presents with  . othe r    Follow up from [facility] ER; left arm pain. Meds reviewed by the pt. verbally.    Abnormal EKG   History of Present Illness: [name] is a 70 y.o. female who presents for evaluation of abnormal EKG per she presented to the ER with left arm pain that was sudden in onset going on continuously for several days, staying in the left arm no radiation, not getting any better, and therefore needed to go to the ER for pain control. At that visit, and with PCP, she was instructed to do some neck exercises and that seemed to help with the resolution of the left arm pain. She was found to have an abnormal EKG in the ER. She was ruled out. She is referred to cardiology for further evaluation  Since her ER visit, she has since establish with PCP who according to the patient send her for a stress test.  Currently, patient denies chest pain, shortness of breath, palpitations, no syncope. No abdominal pain. Left arm pain is resolved.   ROS:  Please see the history of present illness. Aside from mentioned under HPI, all other systems are reviewed and negative.     Past Medical History:  Diagnosis Date  . Arthritis   . Diabetes mellitus without complication ([place])   . Hypercholesterolemia   . Hypertension   . Hypothyroid   . Seasonal allergies     Past Surgical History:  Procedure Laterality Date  . ABDOMINAL HYSTERECTOMY    . BREAST BIOPSY Left    neg  . BREAST EXCISIONAL BIOPSY Right 2004   breast ca radation  . CHOLECYSTECTOMY    . KIDNEY STONE SURGERY       reports that she has quit smoking. Her smoking use included Cigarettes. She has never used smokeless tobacco. She reports that she does not drink alcohol or use drugs.    family history is not on file.   Outpatient Medications Prior to Visit  Medication Sig Dispense Refill  . aspirin 81 MG EC tablet Take 1 tablet (81 mg total) by mouth daily. Swallow whole. 30 tablet 12  . clonazePAM (KLONOPIN) 1 MG tablet Take 1 tablet (1 mg total) by mouth at bedtime. 30 tablet 0  . enalapril (VASOTEC) 20 MG tablet Take 1 tablet (20 mg total) by mouth 2 (two) times daily. 30 tablet 0  . levothyroxine (SYNTHROID, LEVOTHROID) 75 MCG tablet Take 1 tablet (75 mcg total) by mouth every morning. 30 tablet 0  . metFORMIN (GLUCOPHAGE) 1000 MG tablet Take 1 tablet (1,000 mg total) by mouth 2 (two) times daily. 60 tablet 0  . simvastatin (ZOCOR) 20 MG tablet Take 1 tablet (20 mg total) by mouth daily. 30 tablet 0  . buPROPion (WELLBUTRIN SR) 100 MG 12 hr tablet Take 1 tablet (100 mg total) by mouth daily. (Patient not taking: Reported on 03/14/2016) 30 tablet 0  . cholecalciferol (VITAMIN D) 1000 UNITS tablet Take 3 tablets (3,000 Units total) by mouth daily. (Patient not taking: Reported on 03/14/2016) 30 tablet 0  . ciprofloxacin (CIPRO) 500 MG tablet Take 1 tablet (500 mg total) by mouth 2 (two) times daily. (Patient not taking: Reported on 03/14/2016) 20 tablet  0  . levocetirizine (XYZAL) 5 MG tablet Take 1 tablet (5 mg total) by mouth every evening. (Patient not taking: Reported on 03/14/2016) 30 tablet 0  . levothyroxine (SYNTHROID, LEVOTHROID) 75 MCG tablet Take 1 tablet (75 mcg total) by mouth every morning. (Patient not taking: Reported on 03/14/2016) 30 tablet 0  . metroNIDAZOLE (FLAGYL) 500 MG tablet Take 1 tablet (500 mg total) by mouth 3 (three) times daily. (Patient not taking: Reported on 03/14/2016) 30 tablet 0  . oxyCODONE-acetaminophen (PERCOCET) 5-325 MG tablet Take 2 tablets by mouth every 6 (six) hours as needed for moderate pain or severe pain. (Patient not taking: Reported on 03/14/2016) 20 tablet 0  . traZODone (DESYREL) 100 MG tablet Take 1.5 tablets (150 mg  total) by mouth at bedtime. (Patient not taking: Reported on 03/14/2016) 45 tablet 0   No facility-administered medications prior to visit.      Allergies: Navane [thiothixene] and Penicillins    PHYSICAL EXAM: VS:  BP 138/84 (BP Location: Left Arm, Patient Position: Sitting, Cuff Size: Normal)   Pulse 88   Ht 5\' 1"  (1.549 m)   Wt 146 lb 8 oz (66.5 kg)   BMI 27.68 kg/m  , Body mass index is 27.68 kg/m. Wt Readings from Last 3 Encounters:  03/14/16 146 lb 8 oz (66.5 kg)  01/18/16 149 lb (67.6 kg)  02/15/15 149 lb (67.6 kg)    GENERAL:  well developed, well nourished, not in acute distress HEENT: normocephalic, pink conjunctivae, anicteric sclerae, no xanthelasma, normal dentition, oropharynx clear NECK:  no neck vein engorgement, JVP normal, no hepatojugular reflux, carotid upstroke brisk and symmetric, no bruit, no thyromegaly, no lymphadenopathy LUNGS:  good respiratory effort, clear to auscultation bilaterally CV:  PMI not displaced, no thrills, no lifts, S1 and S2 within normal limits, no palpable S3 or S4, no murmurs, no rubs, no gallops ABD:  Soft, nontender, nondistended, normoactive bowel sounds, no abdominal aortic bruit, no hepatomegaly, no splenomegaly MS: nontender back, no kyphosis, no scoliosis, no joint deformities EXT:  2+ DP/PT pulses, no edema, no varicosities, no cyanosis, no clubbing SKIN: warm, nondiaphoretic, normal turgor, no ulcers NEUROPSYCH: alert, oriented to person, place, and time, sensory/motor grossly intact, normal mood, appropriate affect  Recent Labs: 01/18/2016: ALT 12; BUN 17; Creatinine, Ser 1.04; Hemoglobin 14.1; Platelets 295; Potassium 4.0; Sodium 137   Lipid Panel    Component Value Date/Time   CHOL 149 03/26/2012 0617   TRIG 161 03/26/2012 0617   HDL 48 03/26/2012 0617   VLDL 32 03/26/2012 0617   LDLCALC 69 03/26/2012 0617     Other studies Reviewed:  EKG:  The ekg from 03/14/2016 was personally reviewed by me and it revealed  sinus rhythm, 88 BPM. LAD. Q waves in V1 and V2, possible septal infarct, age undetermined. ST and T-wave changes inferior and anterolateral leads.  Additional studies/ records that were reviewed personally reviewed by me today include: None available   ASSESSMENT AND PLAN: Left arm pain Resolved with neck exercises, likely noncardiac   Abnormal EKG Stress test is recommended.  Patient had a stress test 10/25/2015, [facility] protocol, EF of 55%, no evidence of ischemia and, no wall motion abnormality. This and this testing, likelihood of clinically significant CAD is low. Recommend echocardiogram.  Hypertension BP is well controlled. Continue monitoring BP. Continue current medical therapy and lifestyle changes.  Current medicines are reviewed at length with the patient today.  The patient does not have concerns regarding medicines.  Labs/ tests ordered today include:  Orders Placed This Encounter  Procedures  . EKG 12-Lead    I had a lengthy and detailed discussion with the patient regarding diagnoses, prognosis, diagnostic options, treatment options , and side effects of medications.   I counseled the patient on importance of lifestyle modification including heart healthy diet, regular physical activity    Disposition:   FU with undersigned after tests prn  Signed, Wende Bushy, MD  03/14/2016 1:30 PM    [place]  This note was generated in part with voice recognition software and I apologize for any typographical errors that were not detected and corrected.

## 2016-03-14 NOTE — Patient Instructions (Addendum)
Testing/Procedures: Your physician has requested that you have an echocardiogram. Echocardiography is a painless test that uses sound waves to create images of your heart. It provides your doctor with information about the size and shape of your heart and how well your heart's chambers and valves are working. This procedure takes approximately one hour. There are no restrictions for this procedure.    Follow-Up: Your physician recommends that you schedule a follow-up appointment as needed with Dr. Yvone Neu. We will call you with results and if needed schedule follow up at that time.   It was a pleasure seeing you today here in the office. Please do not hesitate to give [place] a call back if you have any further questions. [place], BSN     Echocardiogram An echocardiogram, or echocardiography, uses sound waves (ultrasound) to produce an image of your heart. The echocardiogram is simple, painless, obtained within a short period of time, and offers valuable information to your health care provider. The images from an echocardiogram can provide information such as:  Evidence of coronary artery disease (CAD).  Heart size.  Heart muscle function.  Heart valve function.  Aneurysm detection.  Evidence of a past heart attack.  Fluid buildup around the heart.  Heart muscle thickening.  Assess heart valve function. LET [facility] CARE PROVIDER KNOW ABOUT:  Any allergies you have.  All medicines you are taking, including vitamins, herbs, eye drops, creams, and over-the-counter medicines.  Previous problems you or members of your family have had with the use of anesthetics.  Any blood disorders you have.  Previous surgeries you have had.  Medical conditions you have.  Possibility of pregnancy, if this applies. BEFORE THE PROCEDURE  No special preparation is needed. Eat and drink normally.  PROCEDURE   In order to produce an image of your heart, gel will be  applied to your chest and a wand-like tool (transducer) will be moved over your chest. The gel will help transmit the sound waves from the transducer. The sound waves will harmlessly bounce off your heart to allow the heart images to be captured in real-time motion. These images will then be recorded.  You may need an IV to receive a medicine that improves the quality of the pictures. AFTER THE PROCEDURE You may return to your normal schedule including diet, activities, and medicines, unless your health care provider tells you otherwise.   This information is not intended to replace advice given to you by your health care provider. Make sure you discuss any questions you have with your health care provider.   Document Released: 05/03/2000 Document Revised: 05/27/2014 Document Reviewed: 01/11/2013 Elsevier Interactive Patient Education [employer].

## 2016-04-11 ENCOUNTER — Emergency Department: Payer: Medicare Other

## 2016-04-11 ENCOUNTER — Encounter: Payer: Self-pay | Admitting: Intensive Care

## 2016-04-11 ENCOUNTER — Emergency Department
Admission: EM | Admit: 2016-04-11 | Discharge: 2016-04-11 | Disposition: A | Discharge status: Home / Self Care | Payer: Medicare Other | Attending: Emergency Medicine | Admitting: Emergency Medicine

## 2016-04-11 DIAGNOSIS — E039 Hypothyroidism, unspecified: Secondary | ICD-10-CM | POA: Insufficient documentation

## 2016-04-11 DIAGNOSIS — Z87891 Personal history of nicotine dependence: Secondary | ICD-10-CM | POA: Diagnosis not present

## 2016-04-11 DIAGNOSIS — Z79899 Other long term (current) drug therapy: Secondary | ICD-10-CM | POA: Insufficient documentation

## 2016-04-11 DIAGNOSIS — R1032 Left lower quadrant pain: Secondary | ICD-10-CM

## 2016-04-11 DIAGNOSIS — R1012 Left upper quadrant pain: Secondary | ICD-10-CM | POA: Insufficient documentation

## 2016-04-11 DIAGNOSIS — E119 Type 2 diabetes mellitus without complications: Secondary | ICD-10-CM | POA: Diagnosis not present

## 2016-04-11 DIAGNOSIS — I10 Essential (primary) hypertension: Secondary | ICD-10-CM | POA: Insufficient documentation

## 2016-04-11 HISTORY — DX: Malignant (primary) neoplasm, unspecified: C80.1

## 2016-04-11 LAB — URINALYSIS COMPLETE WITH MICROSCOPIC (ARMC ONLY)
BILIRUBIN URINE: NEGATIVE
GLUCOSE, UA: NEGATIVE mg/dL
HGB URINE DIPSTICK: NEGATIVE
Ketones, ur: NEGATIVE mg/dL
Nitrite: NEGATIVE
Protein, ur: NEGATIVE mg/dL
Specific Gravity, Urine: 1.006 (ref 1.005–1.030)
pH: 6 (ref 5.0–8.0)

## 2016-04-11 LAB — CBC
HEMATOCRIT: 39.3 % (ref 35.0–47.0)
HEMOGLOBIN: 13.4 g/dL (ref 12.0–16.0)
MCH: 30.1 pg (ref 26.0–34.0)
MCHC: 34.1 g/dL (ref 32.0–36.0)
MCV: 88.3 fL (ref 80.0–100.0)
Platelets: 311 10*3/uL (ref 150–440)
RBC: 4.46 MIL/uL (ref 3.80–5.20)
RDW: 13.1 % (ref 11.5–14.5)
WBC: 8.6 10*3/uL (ref 3.6–11.0)

## 2016-04-11 LAB — COMPREHENSIVE METABOLIC PANEL
ALBUMIN: 4.4 g/dL (ref 3.5–5.0)
ALT: 14 U/L (ref 14–54)
ANION GAP: 9 (ref 5–15)
AST: 23 U/L (ref 15–41)
Alkaline Phosphatase: 42 U/L (ref 38–126)
BILIRUBIN TOTAL: 0.5 mg/dL (ref 0.3–1.2)
BUN: 32 mg/dL — AB (ref 6–20)
CHLORIDE: 102 mmol/L (ref 101–111)
CO2: 26 mmol/L (ref 22–32)
Calcium: 9.8 mg/dL (ref 8.9–10.3)
Creatinine, Ser: 1.2 mg/dL — ABNORMAL HIGH (ref 0.44–1.00)
GFR calc Af Amer: 52 mL/min — ABNORMAL LOW (ref >60)
GFR calc non Af Amer: 45 mL/min — ABNORMAL LOW (ref >60)
GLUCOSE: 96 mg/dL (ref 65–99)
POTASSIUM: 4.3 mmol/L (ref 3.5–5.1)
SODIUM: 137 mmol/L (ref 135–145)
Total Protein: 7.6 g/dL (ref 6.5–8.1)

## 2016-04-11 LAB — TROPONIN I

## 2016-04-11 LAB — LIPASE, BLOOD: Lipase: 30 U/L (ref 11–51)

## 2016-04-11 IMAGING — CT CT ABD-PELV W/ CM
2 of 5 series · 16 of 46 positions shown, 18 images · IV contrast (APPLIED)
Comparison: None.

CLINICAL DATA: 70-year-old female with abdominal and pelvic pain
for 1 day.

EXAM:
CT ABDOMEN AND PELVIS WITH CONTRAST
TECHNIQUE: Multidetector CT imaging of the abdomen and pelvis was performed
using the standard protocol following bolus administration of
intravenous contrast.
CONTRAST:  75mL [77] IOPAMIDOL ([77]) INJECTION 61%

[Series 2: axial st · axial · 0.79mm/px · z∈[-953,-583]mm · 13 of 84 slices shown, 15 images]
[im 5/84  soft-tissue]
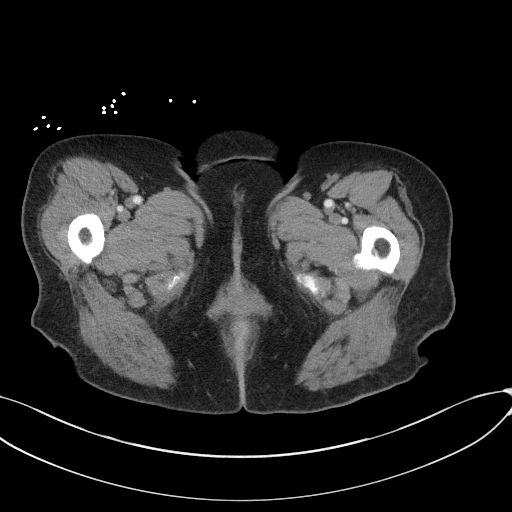
[im 5/84  bone]
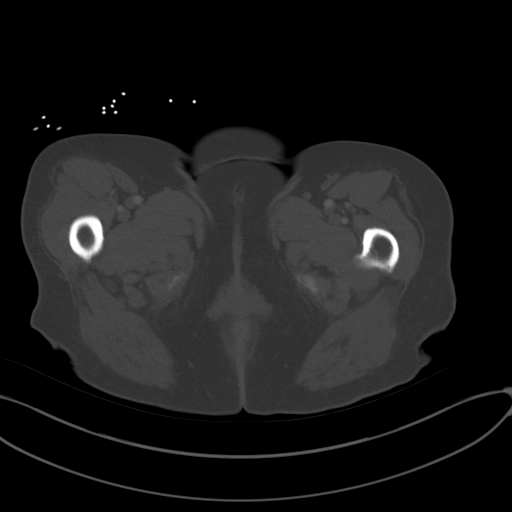
[im 10/84  soft-tissue]
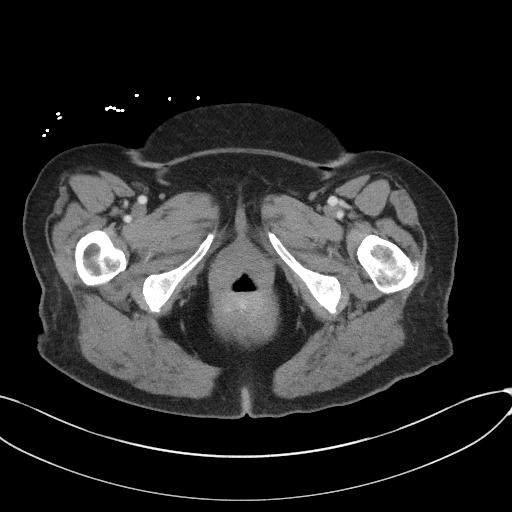
[im 19/84  soft-tissue]
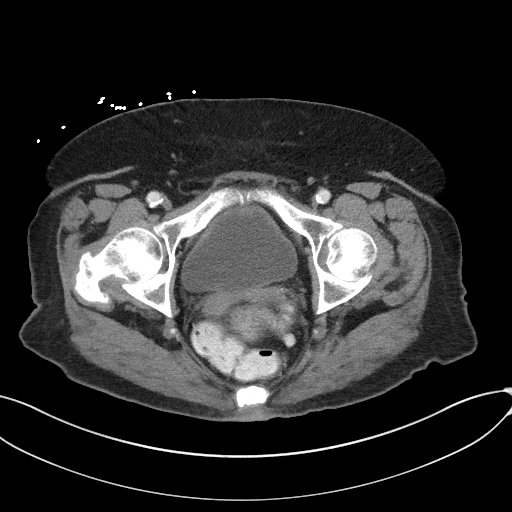
[im 24/84  soft-tissue]
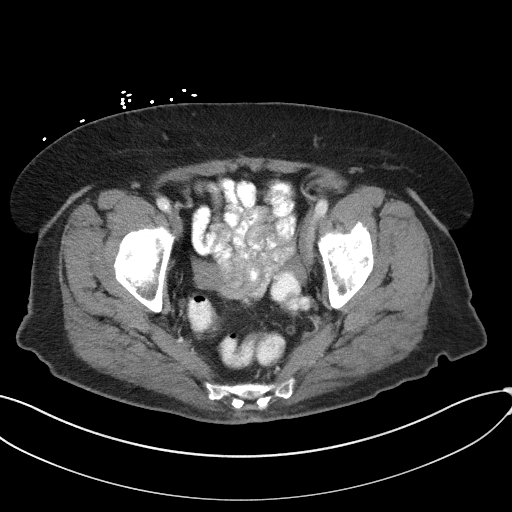
[im 28/84  soft-tissue]
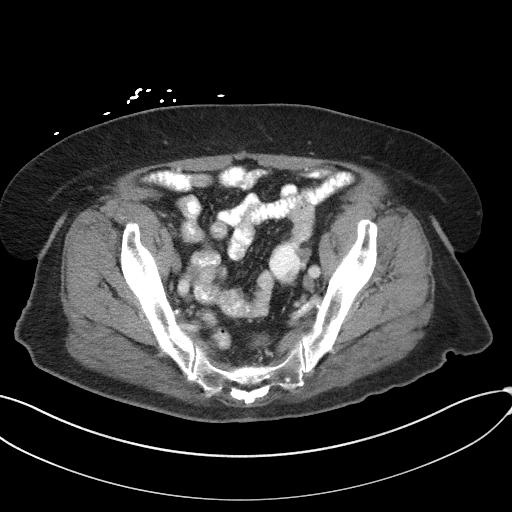
[im 37/84  soft-tissue]
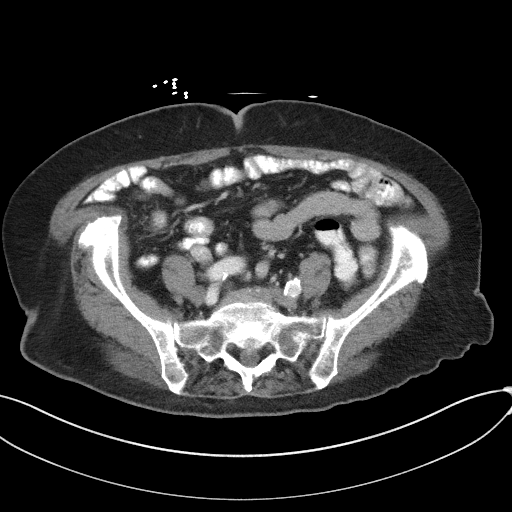
[im 42/84  soft-tissue]
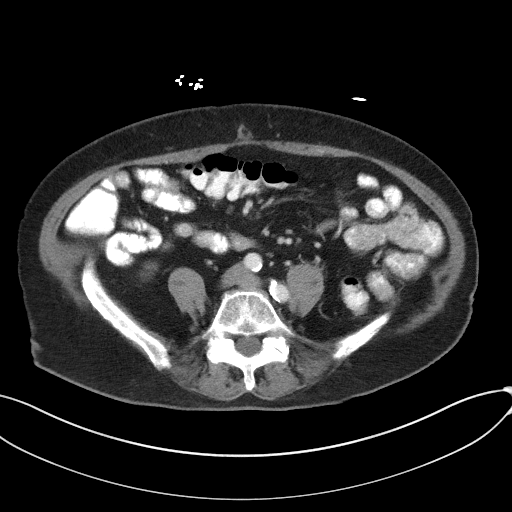
[im 47/84  soft-tissue]
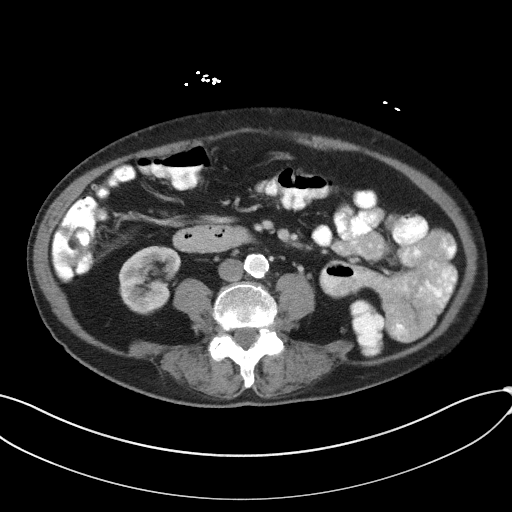
[im 56/84  soft-tissue]
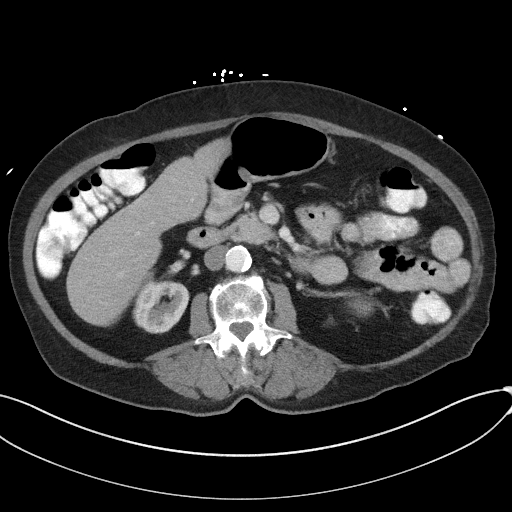
[im 56/84  bone]
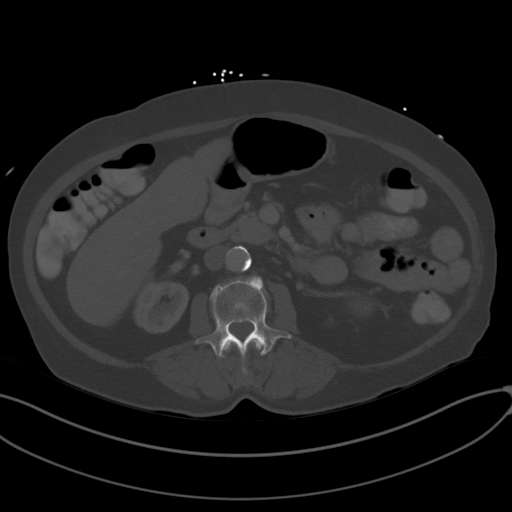
[im 60/84  soft-tissue]
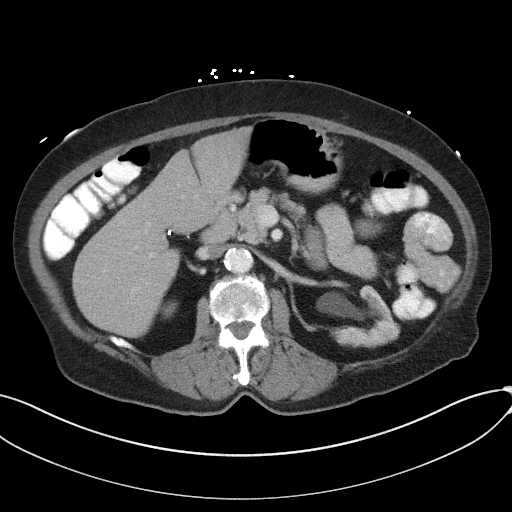
[im 65/84  soft-tissue]
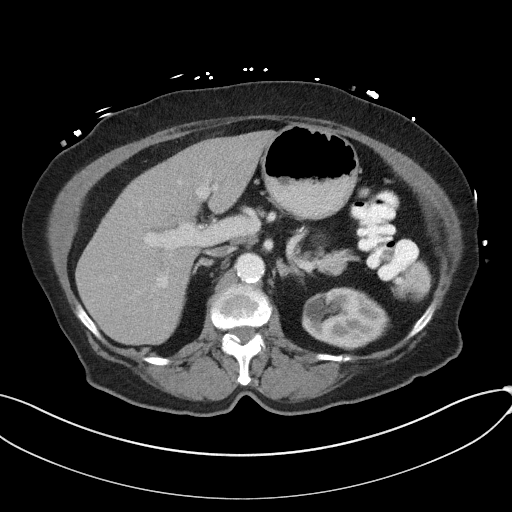
[im 74/84  soft-tissue]
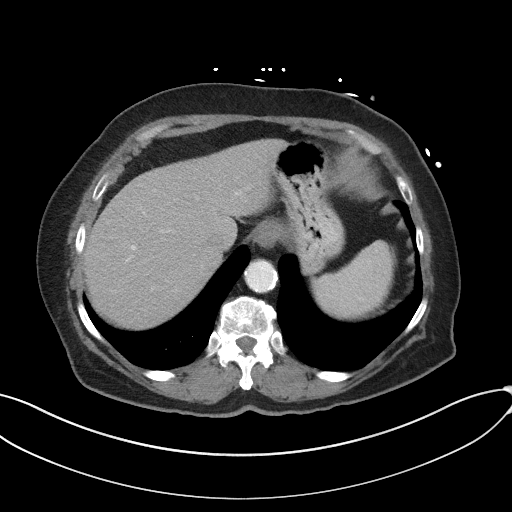
[im 79/84  soft-tissue]
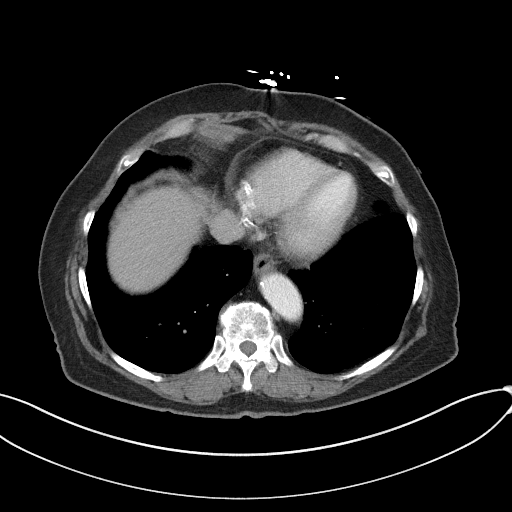

[Series 5: coronal st · coronal · 0.68mm/px · 3 of 84 slices shown]
[im 28/84  soft-tissue]
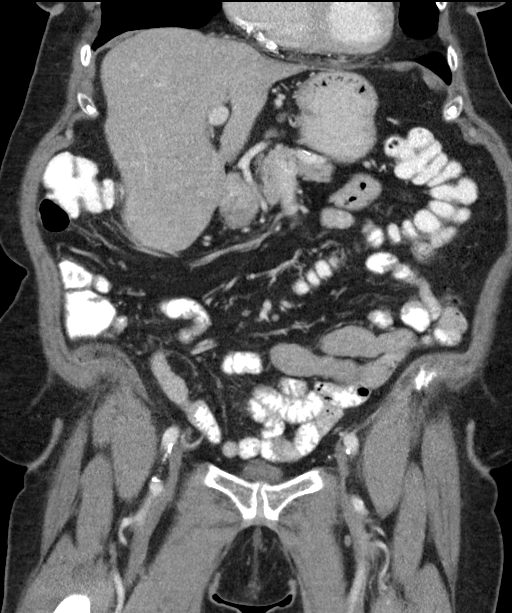
[im 37/84  soft-tissue]
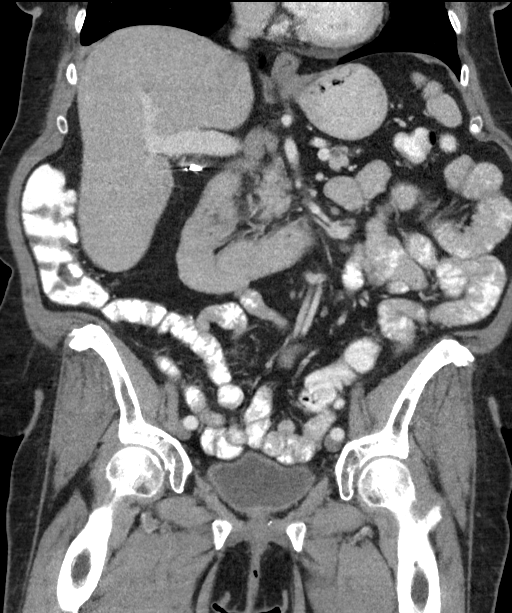
[im 47/84  soft-tissue]
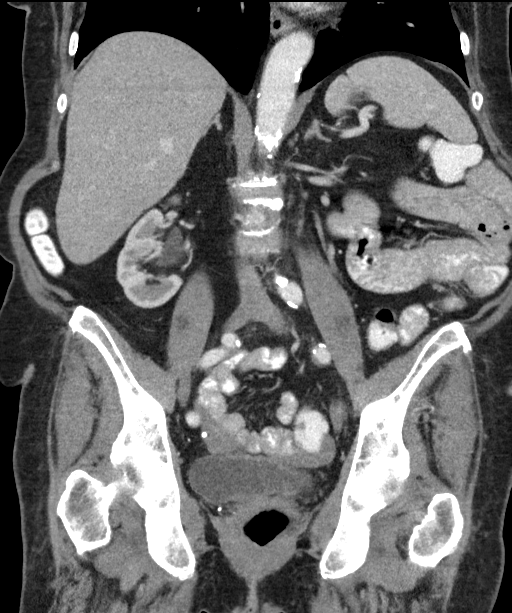

[16 of 46 positions shown; findings below may reference images not displayed]

FINDINGS: Lower chest: No acute abnormality. Coronary artery calcifications
identified.

Hepatobiliary: The liver is unremarkable. The patient is status post
cholecystectomy. There is no evidence of biliary dilatation.

Pancreas: Unremarkable

Spleen: Unremarkable

Adrenals/Urinary Tract: Nonobstructing bilateral renal calculi are
identified including 4 mm and 7 mm mid right renal calculi and a 2
mm left upper pole renal calculus. No obstructing urinary calculi
are identified. The bladder and adrenal glands are unremarkable.

Stomach/Bowel: Mild colonic diverticulosis noted without evidence of
diverticulitis. There is no evidence of focal bowel wall thickening
or bowel obstruction. The appendix is normal.

Vascular/Lymphatic: Aortic atherosclerotic calcifications noted
without aneurysm. No enlarged lymph nodes identified.

Reproductive: Patient is status post hysterectomy. No adnexal masses
are identified.

Other: No acute or suspicious abnormality.

Musculoskeletal: No acute or significant osseous findings.
IMPRESSION: No evidence of acute abnormality.

Nonobstructing bilateral renal calculi.

Abdominal aortic atherosclerosis.

Coronary artery disease.

## 2016-04-11 MED ORDER — IOPAMIDOL (ISOVUE-300) INJECTION 61%
75.0000 mL | Freq: Once | INTRAVENOUS | Status: AC | PRN
  Administered 2016-04-11: 75 mL via INTRAVENOUS

## 2016-04-11 MED ORDER — IOPAMIDOL (ISOVUE-300) INJECTION 61%
30.0000 mL | Freq: Once | INTRAVENOUS | Status: AC
  Administered 2016-04-11: 30 mL via ORAL

## 2016-04-11 NOTE — Discharge Instructions (Signed)
You were evaluated for abdominal pain, and although no certain cause was found, your examine evaluation are reassuring in the emergency department today. Return to the emergency department for any worsening abdominal pain, black or bloody stool, vomiting with concern for dehydration such as dry mouth or not making any urine, confusion altered mental status, dizziness or passing out, fever, or any other symptoms concerning to you. Return for any chest discomfort, or trouble breathing.

## 2016-04-11 NOTE — ED Triage Notes (Signed)
Patient presents to ER by EMS from home. States "yesterday morning I started having LLQ pain. I took a laxative and had a BM and pain went away late yesterday afternoon. Pain came back this AM in my L lower abdomen and called EMS". Patient is A&O x4 Denies chest pain or SOB at this time.

## 2016-04-11 NOTE — ED Provider Notes (Signed)
[facility] Emergency Department Provider Note ____________________________________________   I have reviewed the triage vital signs and the triage nursing note.  HISTORY  Chief Complaint Abdominal Pain (Left lower)   Historian Patient  HPI [name] is a 70 y.o. female who is brought in by EMS after calling due to left-sided abdominal pain. She had an episode like this that lasted a few hours yesterday for which she did not seek evaluation. She states that she's also had a few episodes of this in the past which she has never sought evaluation. Because of the recurrence she decided to call EMS today. She has a history of lumpectomy in 2004 and states that she is a little concerned about cancer recurrence, and that she has some discomfort in the left armpit.  No chest pain. No coughing. No fever. Yesterday she thought she had constipation and took a laxative and had a large amount of stool and her symptoms had resolved. Today she has not had a bowel movement. No nausea or vomiting.    Past Medical History:  Diagnosis Date  . Arthritis   . Cancer ([place])   . Diabetes mellitus without complication ([place])   . Hypercholesterolemia   . Hypertension   . Hypothyroid   . Seasonal allergies     Patient Active Problem List   Diagnosis Date Noted  . Diabetes ([place]) 09/23/2014  . Bipolar 1 disorder, mixed ([place])   . Essential hypertension 09/21/2014  . Hypothyroidism 09/21/2014  . Dyslipidemia 09/21/2014  . Bipolar 1 disorder, mixed, moderate ([place]) 09/20/2014  . Bipolar I disorder, current or most recent episode manic, severe with mixed features ([place]) 09/20/2014  . Bipolar disorder, mixed ([place]) 09/19/2014    Past Surgical History:  Procedure Laterality Date  . ABDOMINAL HYSTERECTOMY    . BREAST BIOPSY Left    neg  . BREAST EXCISIONAL BIOPSY Right 2004   breast ca radation  . CHOLECYSTECTOMY    . KIDNEY STONE SURGERY      Prior to Admission medications    Medication Sig Start Date End Date Taking? Authorizing Provider  aspirin 81 MG EC tablet Take 1 tablet (81 mg total) by mouth daily. Swallow whole. 01/18/16   Earleen [place], MD  clonazePAM (KLONOPIN) 1 MG tablet Take 1 tablet (1 mg total) by mouth at bedtime. 09/23/14   Clovis Fredrickson, MD  enalapril (VASOTEC) 20 MG tablet Take 1 tablet (20 mg total) by mouth 2 (two) times daily. 09/23/14   Clovis Fredrickson, MD  levothyroxine (SYNTHROID, LEVOTHROID) 75 MCG tablet Take 1 tablet (75 mcg total) by mouth every morning. 09/23/14   Clovis Fredrickson, MD  metFORMIN (GLUCOPHAGE) 1000 MG tablet Take 1 tablet (1,000 mg total) by mouth 2 (two) times daily. 09/23/14   Clovis Fredrickson, MD  simvastatin (ZOCOR) 20 MG tablet Take 1 tablet (20 mg total) by mouth daily. 09/23/14   Clovis Fredrickson, MD    Allergies  Allergen Reactions  . Navane [Thiothixene] Other (See Comments)    Reaction:  Unknown  . Penicillins Rash    History reviewed. No pertinent family history.  Social History Social History  Substance Use Topics  . Smoking status: Former Smoker    Types: Cigarettes  . Smokeless tobacco: Never Used  . Alcohol use No    Review of Systems  Constitutional: Negative for fever. Eyes: Negative for visual changes. ENT: Negative for sore throat. Cardiovascular: Negative for chest pain. Respiratory: Negative for shortness of breath.  Gastrointestinal: Negative for vomiting and diarrhea. Genitourinary: Negative for dysuria. Musculoskeletal: Negative for back pain. Skin: Negative for rash. Neurological: Negative for headache. 10 point Review of Systems otherwise negative ____________________________________________   PHYSICAL EXAM:  VITAL SIGNS: ED Triage Vitals  Enc Vitals Group     BP 04/11/16 1251 123/81     Pulse Rate 04/11/16 1251 76     Resp 04/11/16 1251 18     Temp 04/11/16 1251 98.8 F (37.1 C)     Temp Source 04/11/16 1251 Oral     SpO2 04/11/16 1251 99 %      Weight 04/11/16 1252 146 lb (66.2 kg)     Height 04/11/16 1252 5\' 1"  (1.549 m)     Head Circumference --      Peak Flow --      Pain Score --      Pain Loc --      Pain Edu? --      Excl. in [place]? --      Constitutional: Alert and oriented. Well appearing and in no distress. HEENT   Head: Normocephalic and atraumatic.      Eyes: Conjunctivae are normal. PERRL. Normal extraocular movements.      Ears:         Nose: No congestion/rhinnorhea.   Mouth/Throat: Mucous membranes are moist.   Neck: No stridor. Cardiovascular/Chest: Normal rate, regular rhythm.  No murmurs, rubs, or gallops. Respiratory: Normal respiratory effort without tachypnea nor retractions. Breath sounds are clear and equal bilaterally. No wheezes/rales/rhonchi. Gastrointestinal: Soft. No distention, no guarding, no rebound.  Mild lumbar tenderness in the epigastrium and left upper quadrant.  Genitourinary/rectal:Deferred Musculoskeletal: Nontender with normal range of motion in all extremities. No joint effusions.  No lower extremity tenderness.  No edema. Neurologic:  Normal speech and language. No gross or focal neurologic deficits are appreciated. Skin:  Skin is warm, dry and intact. No rash noted. Psychiatric: Mood and affect are normal. Speech and behavior are normal. Patient exhibits appropriate insight and judgment.   ____________________________________________  LABS (pertinent positives/negatives)  Labs Reviewed  COMPREHENSIVE METABOLIC PANEL - Abnormal; Notable for the following:       Result Value   BUN 32 (*)    Creatinine, Ser 1.20 (*)    GFR calc non Af Amer 45 (*)    GFR calc Af Amer 52 (*)    All other components within normal limits  URINALYSIS COMPLETEWITH MICROSCOPIC ([facility] ONLY) - Abnormal; Notable for the following:    Color, Urine STRAW (*)    APPearance CLEAR (*)    Leukocytes, UA 2+ (*)    Bacteria, UA RARE (*)    Squamous Epithelial / LPF 0-5 (*)    All other components  within normal limits  LIPASE, BLOOD  CBC  TROPONIN I    ____________________________________________    EKG I, Lisa Roca, MD, the attending physician have personally viewed and interpreted all ECGs.  67 bpm. Normal sinus rhythm.  Narrow QRS. Normal axis. Nonspecific ST-T wave ____________________________________________  RADIOLOGY All Xrays were viewed by me. Imaging interpreted by Radiologist.  CT abdomen and pelvis with contrast:  IMPRESSION: No evidence of acute abnormality.  Nonobstructing bilateral renal calculi.  Abdominal aortic atherosclerosis.  Coronary artery disease. __________________________________________  PROCEDURES  Procedure(s) performed: None  Critical Care performed: None  ____________________________________________   ED COURSE / ASSESSMENT AND PLAN  Pertinent labs & imaging results that were available during my care of the patient were reviewed by me and considered in  my medical decision making (see chart for details).   Ms. Hortin is here for evaluation of left-sided abdominal pain. Initially she described as left-sided and left lower quadrant, but on exam it seems more epigastric and left upper quadrant. No right upper quadrant right sided or right lower quadrant pain. No fever. Her laboratory studies are reassuring. I discussed with her the day given the persistence of the symptoms, we may proceed with CT imaging of the abdomen and she was interested in pursuing this.  Symptoms do not really sound classic for kidney stone, and no hematuria, and sensorium to do CT scan with contrast per better imaging of the pancreas and intestines.  She seems somewhat worried about recurrence of cancer, but on her exam and no palpable masses, no lymphadenopathy appreciable on left armpit exam. She is also concerned about her mother's death by heart attack, and so I am going to add on EKG and troponin although I think her symptoms are pretty atypical for  ACS.  CT abdomen is reassuring.  EKG and troponin are reassuring and negative. I discussed with her that although no subcostal spell, her exam and evaluation are reassuring. He is okay for discharge home. She does have follow-up scheduled within the week.    CONSULTATIONS:   None   Patient / Family / Caregiver informed of clinical course, medical decision-making process, and agree with plan.   I discussed return precautions, follow-up instructions, and discharge instructions with patient and/or family.   ___________________________________________   FINAL CLINICAL IMPRESSION(S) / ED DIAGNOSES   Final diagnoses:  Left upper quadrant pain              Note: This dictation was prepared with Dragon dictation. Any transcriptional errors that result from this process are unintentional    Lisa Roca, MD 04/11/16 1606

## 2016-04-16 ENCOUNTER — Other Ambulatory Visit: Payer: Self-pay

## 2016-04-16 ENCOUNTER — Ambulatory Visit (INDEPENDENT_AMBULATORY_CARE_PROVIDER_SITE_OTHER): Payer: Medicare Other

## 2016-04-16 DIAGNOSIS — R9431 Abnormal electrocardiogram [ECG] [EKG]: Secondary | ICD-10-CM

## 2016-05-22 ENCOUNTER — Other Ambulatory Visit: Payer: Self-pay | Admitting: Internal Medicine

## 2016-05-22 DIAGNOSIS — Z1231 Encounter for screening mammogram for malignant neoplasm of breast: Secondary | ICD-10-CM

## 2016-06-13 IMAGING — CR DG KNEE COMPLETE 4+V*R*
1 series · 4 of 4 positions shown · non-contrast
Comparison: None.

CLINICAL DATA: Pain for 10 days, worse at night.  No trauma.

EXAM:
RIGHT KNEE - COMPLETE 4+ VIEW

[Series 1: dg knee complete 4 views right · 0.14mm/px · 4 of 4 slices shown]
[im 1/4]
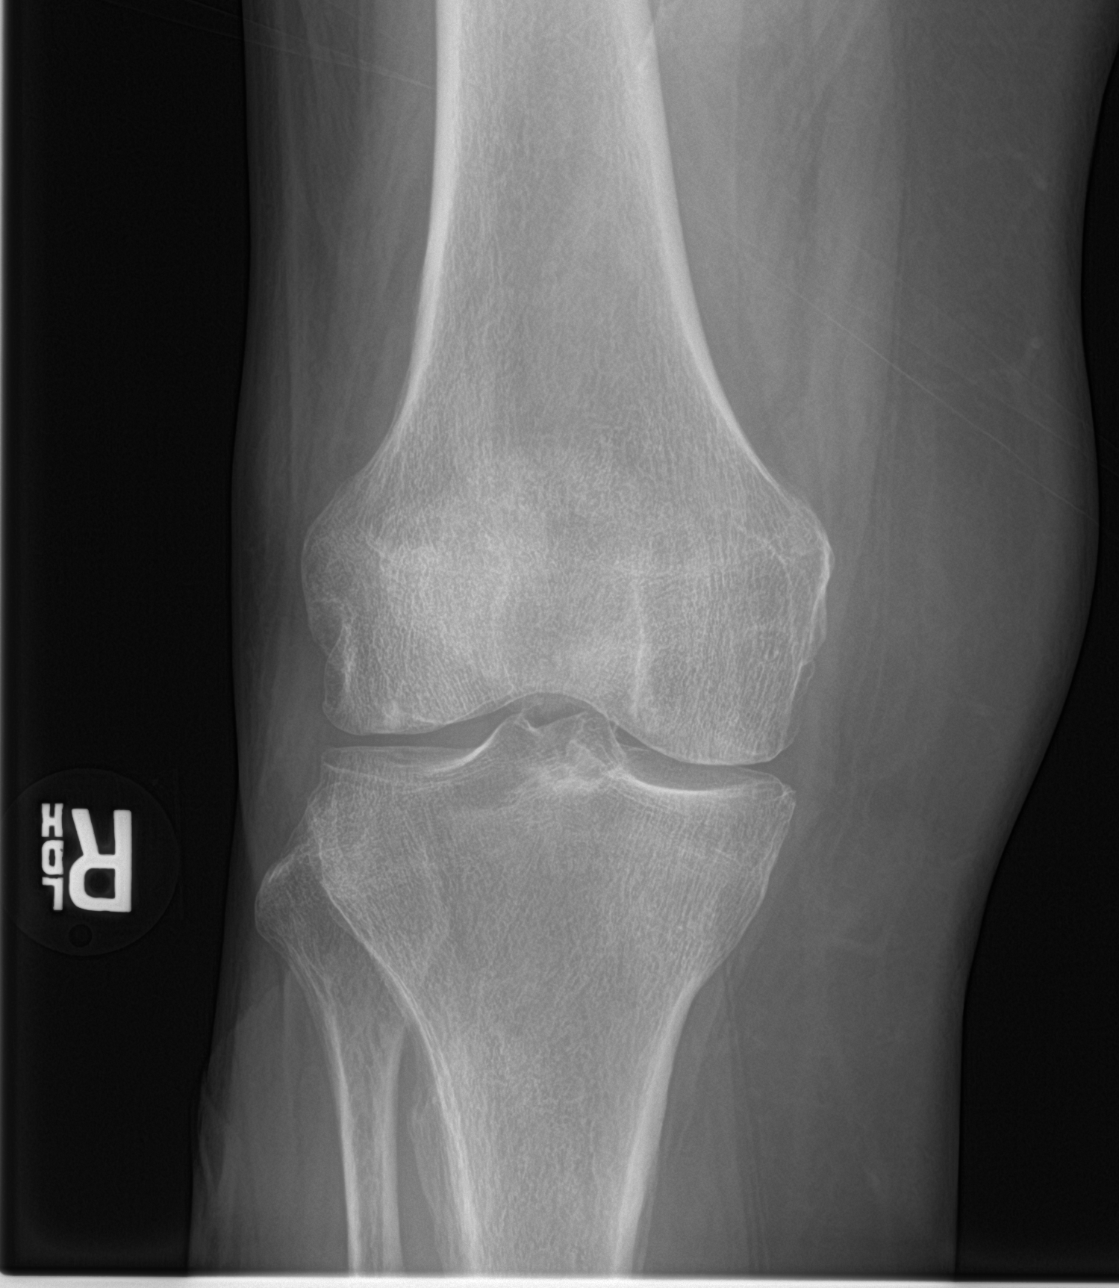
[im 2/4]
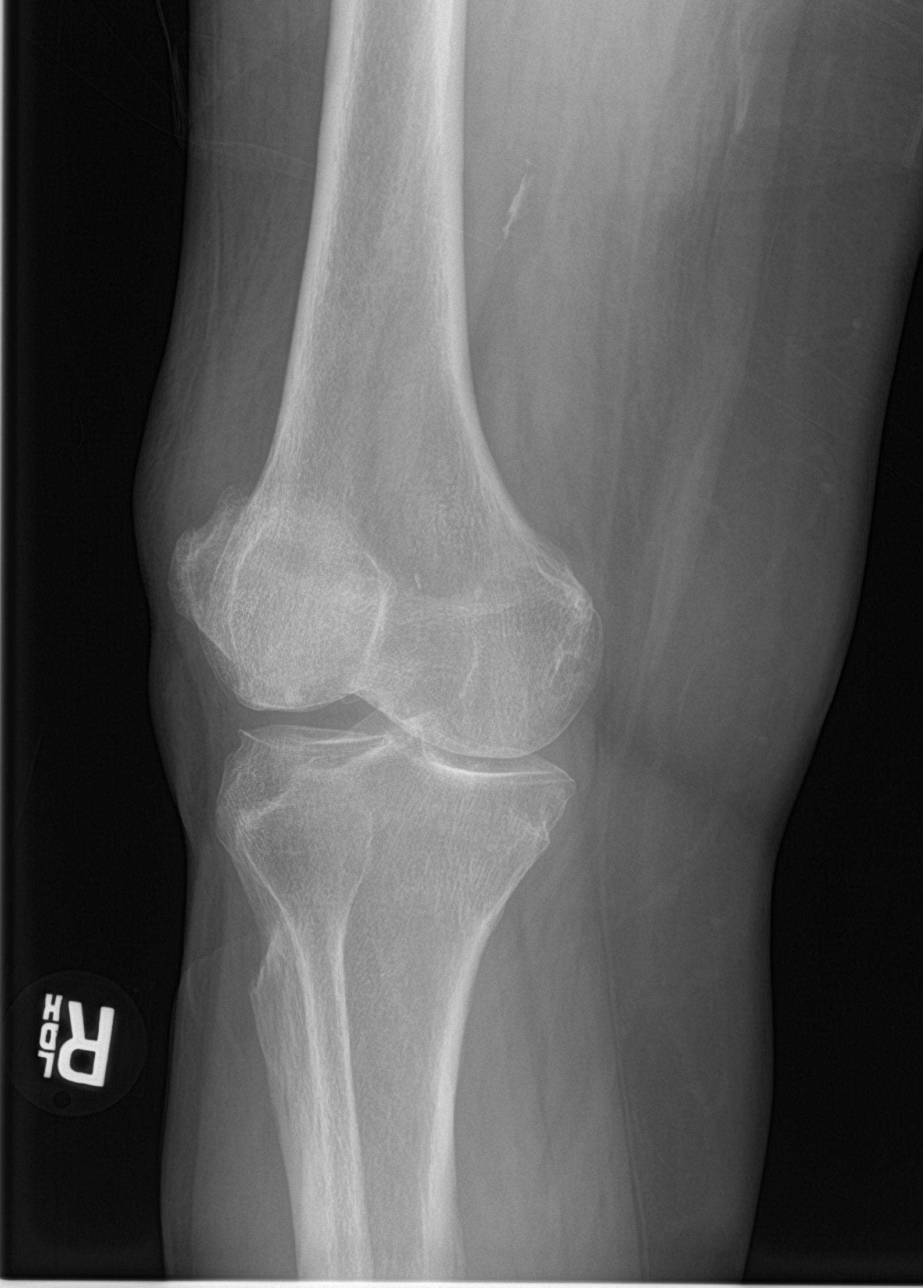
[im 3/4]
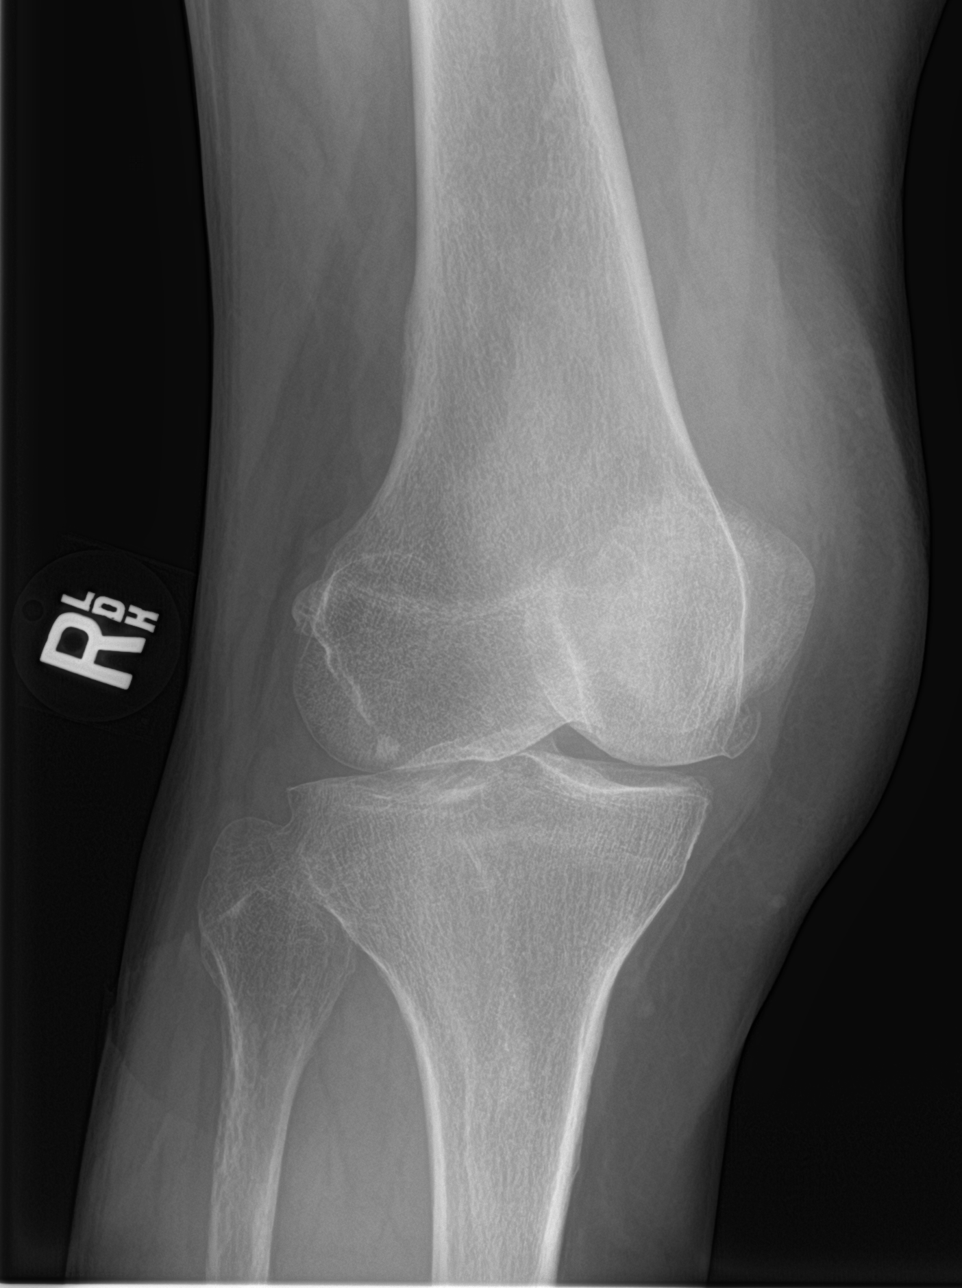
[im 4/4]
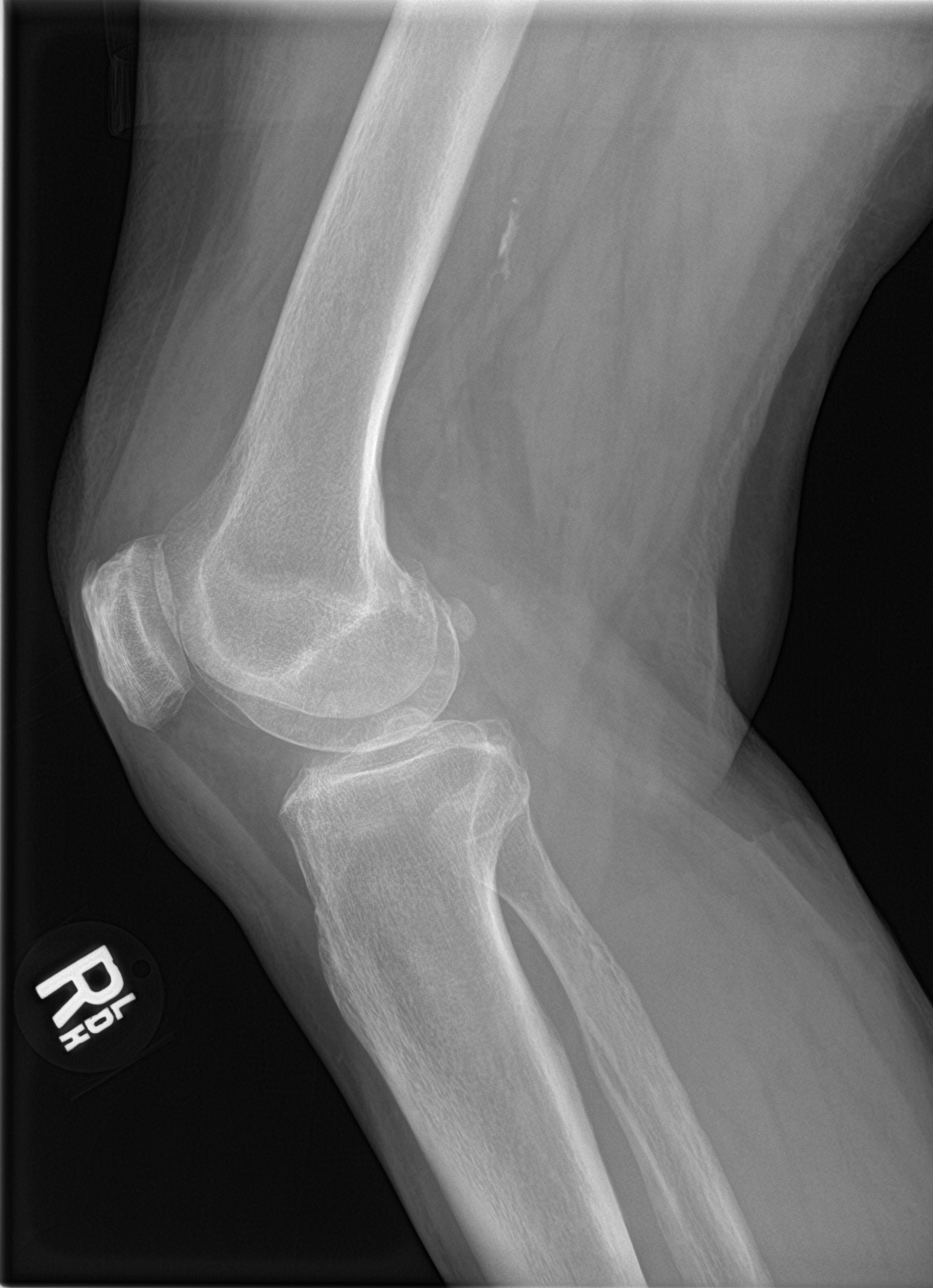

[4 of 4 positions shown; findings below may reference images not displayed]

FINDINGS: No acute fracture or dislocation. Vascular calcifications. Tiny
suprapatellar joint effusion suspected. Mild medial and
patellofemoral compartment osteophyte formation with joint space
narrowing. Enthesophytes at the quadriceps insertion.
IMPRESSION: Degenerative change, without acute osseous finding. Possible small
suprapatellar joint effusion.

## 2016-06-13 IMAGING — US US EXTREM LOW VENOUS*R*
1 series · 13 of 24 positions shown · non-contrast
Comparison: None.

CLINICAL DATA: 72-year-old with right leg pain and swelling.



[Series 1: us extrem low venous*right* · 0.07mm/px · 13 of 34 slices shown]
[im 1/34]
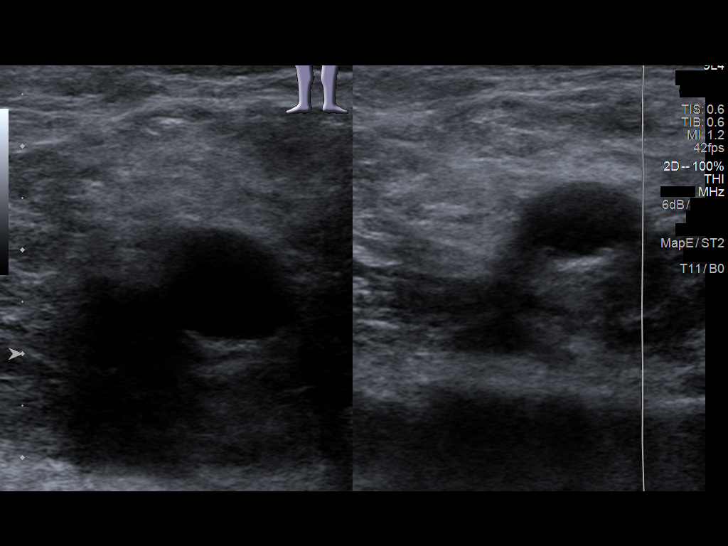
[im 3/34]
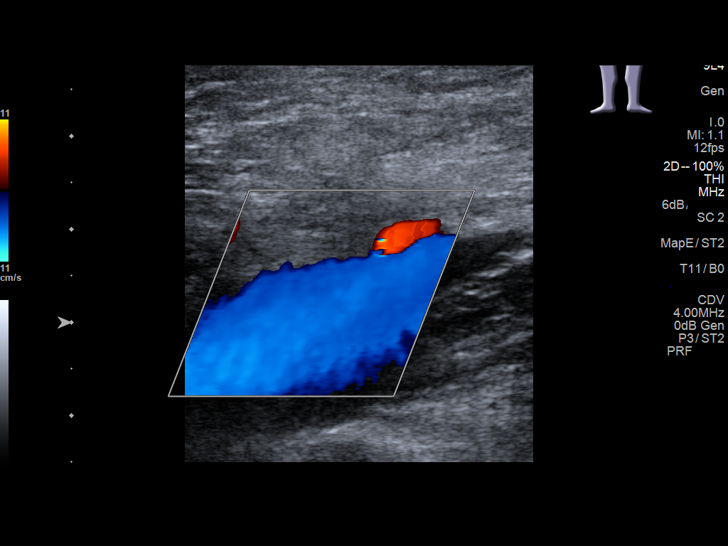
[im 6/34]
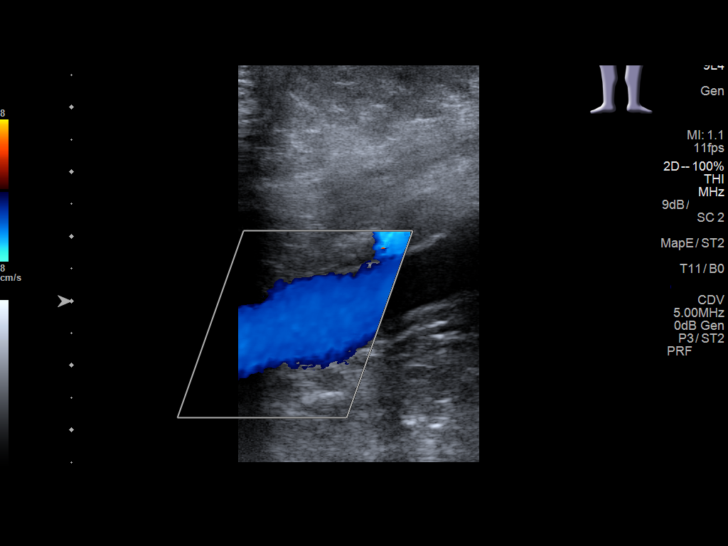
[im 9/34]
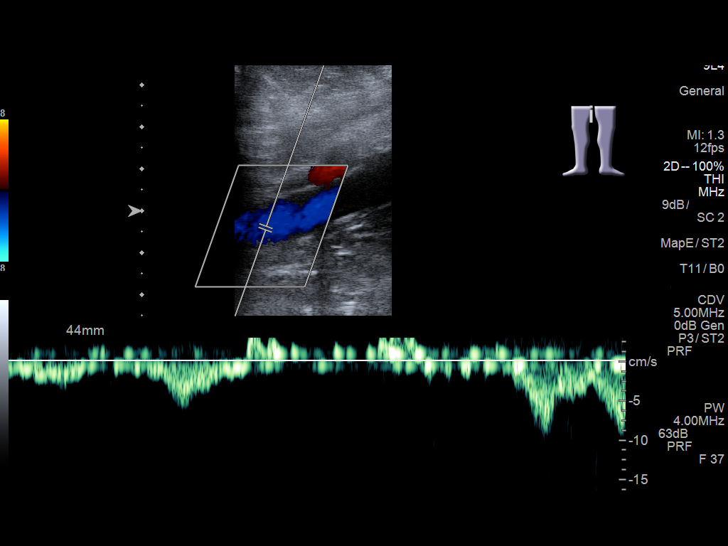
[im 12/34]
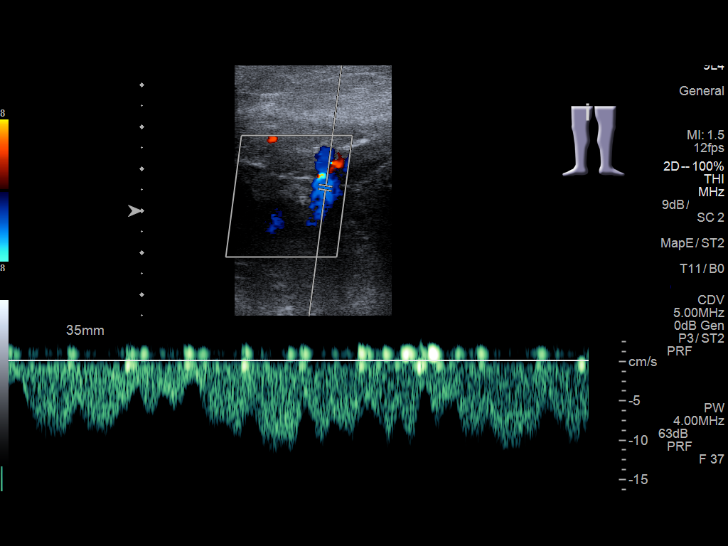
[im 15/34]
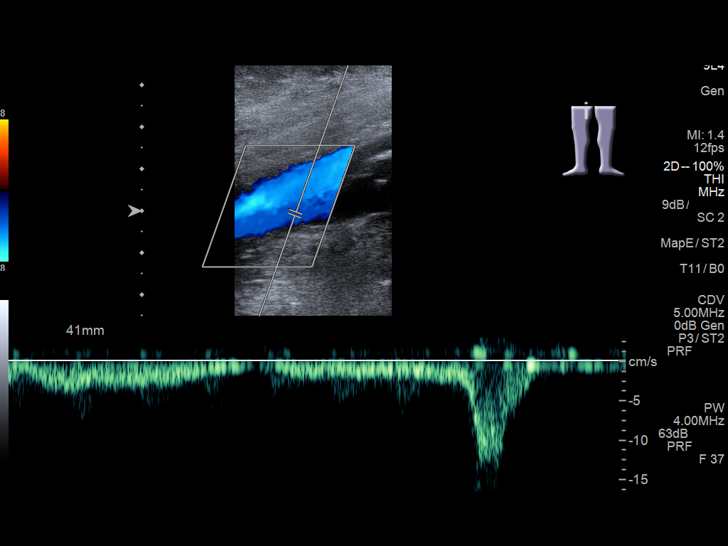
[im 18/34]
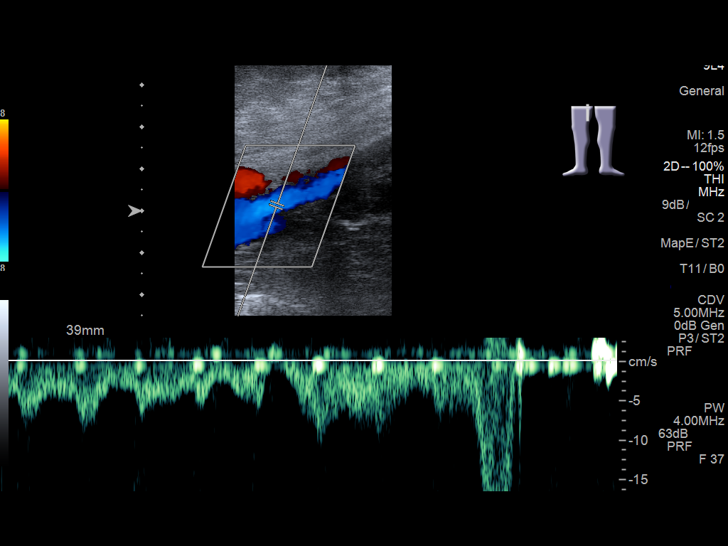
[im 19/34]
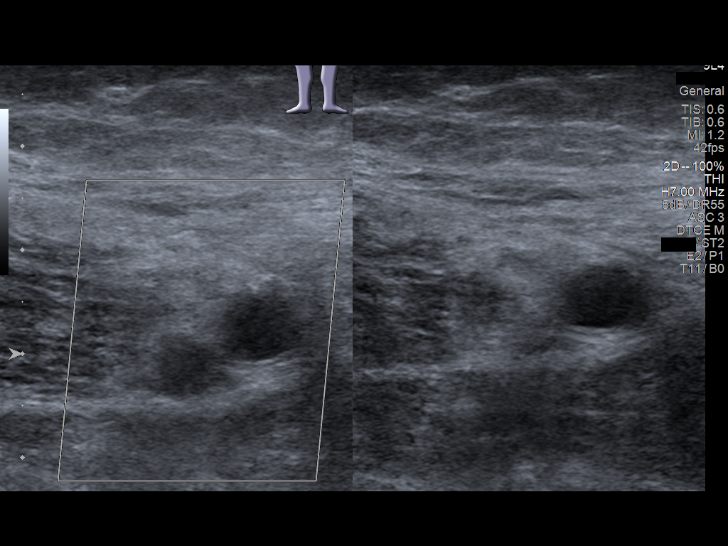
[im 22/34]
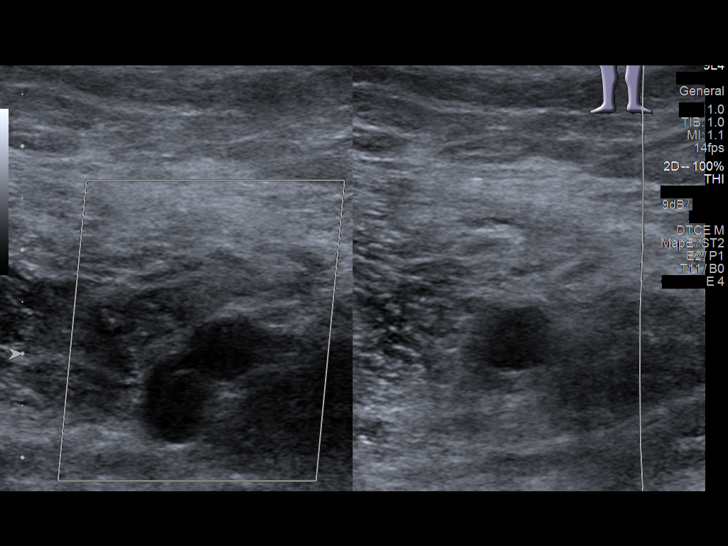
[im 25/34]
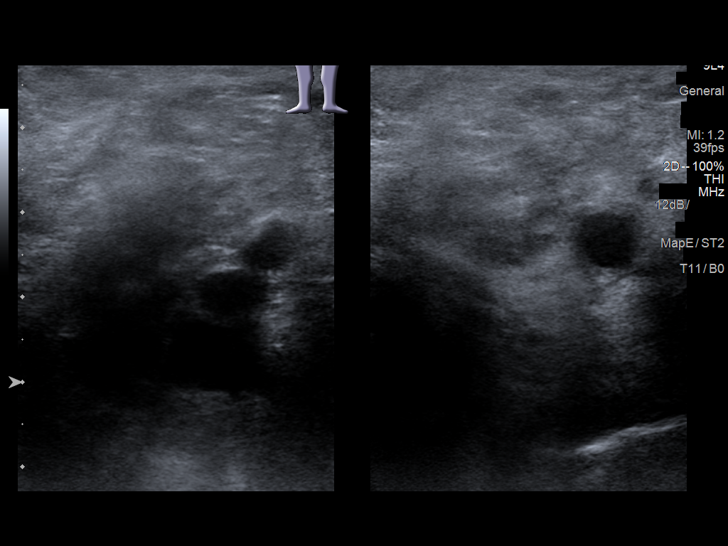
[im 28/34]
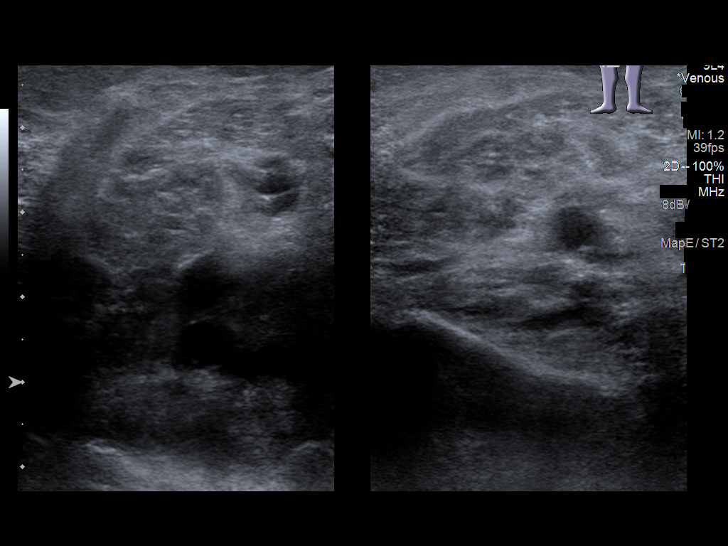
[im 31/34]
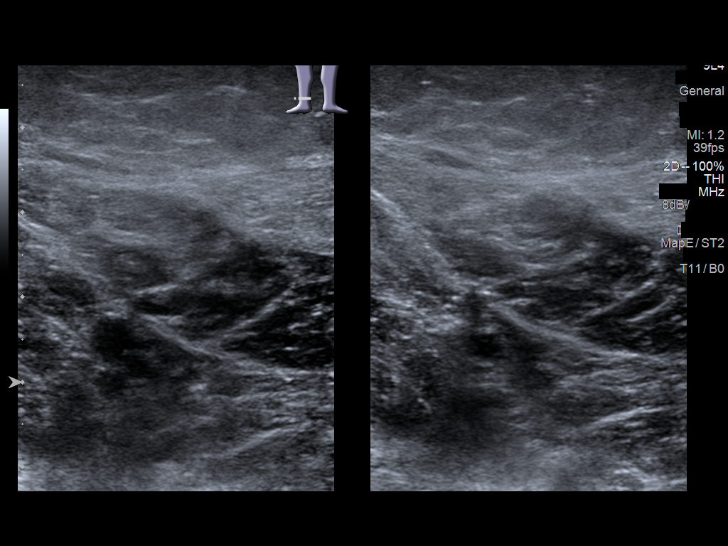
[im 34/34]
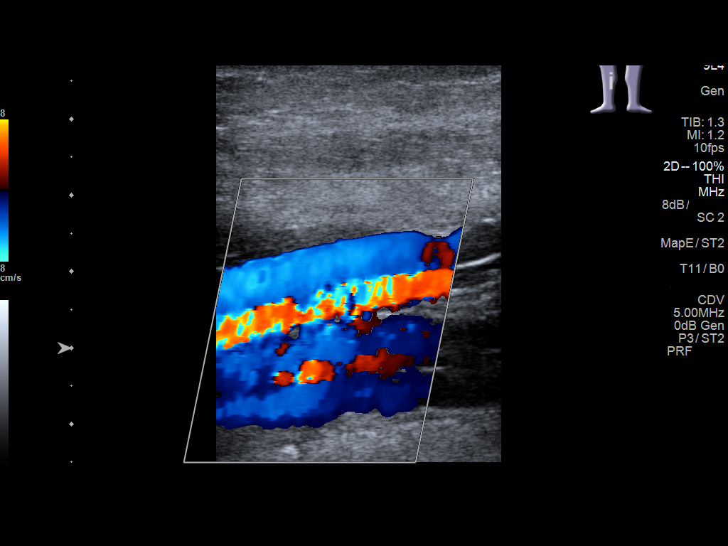

[13 of 24 positions shown; findings below may reference images not displayed]

FINDINGS: Contralateral Common Femoral Vein: Respiratory phasicity is normal
and symmetric with the symptomatic side. No evidence of thrombus.
Normal compressibility.

Common Femoral Vein: No evidence of thrombus. Normal
compressibility, respiratory phasicity and response to augmentation.

Saphenofemoral Junction: No evidence of thrombus. Normal
compressibility and flow on color Doppler imaging.

Profunda Femoral Vein: No evidence of thrombus. Normal
compressibility and flow on color Doppler imaging.

Femoral Vein: No evidence of thrombus. Normal compressibility,
respiratory phasicity and response to augmentation.

Popliteal Vein: No evidence of thrombus. Normal compressibility,
respiratory phasicity and response to augmentation.

Calf Veins: No evidence of thrombus. Normal compressibility and flow
on color Doppler imaging.

Other Findings:  None.
IMPRESSION: Negative for deep venous thrombosis in right lower extremity.

## 2016-06-21 ENCOUNTER — Telehealth: Payer: Self-pay

## 2016-06-21 ENCOUNTER — Other Ambulatory Visit: Payer: Self-pay

## 2016-06-21 NOTE — Telephone Encounter (Signed)
Gastroenterology Pre-Procedure Review  Request Date:  Requesting Physician: Dr.   PATIENT REVIEW QUESTIONS: The patient responded to the following health history questions as indicated:    1. Are you having any GI issues? no 2. Do you have a personal history of Polyps? yes (yes) 3. Do you have a family history of Colon Cancer or Polyps? yes (aunt) 4. Diabetes Mellitus? no 5. Joint replacements in the past 12 months?no 6. Major health problems in the past 3 months?no 7. Any artificial heart valves, MVP, or defibrillator?no    MEDICATIONS & ALLERGIES:    Patient reports the following regarding taking any anticoagulation/antiplatelet therapy:   Plavix, Coumadin, Eliquis, Xarelto, Lovenox, Pradaxa, Brilinta, or Effient? no Aspirin? yes (ASA 81mg )  Patient confirms/reports the following medications:  Current Outpatient Prescriptions  Medication Sig Dispense Refill  . aspirin 81 MG EC tablet Take 1 tablet (81 mg total) by mouth daily. Swallow whole. 30 tablet 12  . clonazePAM (KLONOPIN) 1 MG tablet Take 1 tablet (1 mg total) by mouth at bedtime. 30 tablet 0  . enalapril (VASOTEC) 20 MG tablet Take 1 tablet (20 mg total) by mouth 2 (two) times daily. 30 tablet 0  . levothyroxine (SYNTHROID, LEVOTHROID) 75 MCG tablet Take 1 tablet (75 mcg total) by mouth every morning. 30 tablet 0  . metFORMIN (GLUCOPHAGE) 1000 MG tablet Take 1 tablet (1,000 mg total) by mouth 2 (two) times daily. 60 tablet 0  . simvastatin (ZOCOR) 20 MG tablet Take 1 tablet (20 mg total) by mouth daily. 30 tablet 0   No current facility-administered medications for this visit.     Patient confirms/reports the following allergies:  Allergies  Allergen Reactions  . Navane [Thiothixene] Other (See Comments)    Reaction:  Unknown  . Penicillins Rash    No orders of the defined types were placed in this encounter.   AUTHORIZATION INFORMATION Primary Insurance: 1D#: Group #:  Secondary Insurance: 1D#: Group  #:  SCHEDULE INFORMATION: Date: 07/04/16 Time: Location: [place]

## 2016-06-24 ENCOUNTER — Telehealth: Payer: Self-pay | Admitting: Gastroenterology

## 2016-06-24 NOTE — Telephone Encounter (Signed)
Noted! Thank you

## 2016-06-24 NOTE — Telephone Encounter (Signed)
Patient doesn't have anyone to stay during colonoscopy so she canceled. I called [place]

## 2016-07-04 ENCOUNTER — Ambulatory Visit: Admit: 2016-07-04 | Payer: Medicare Other | Admitting: Gastroenterology

## 2016-07-04 SURGERY — COLONOSCOPY WITH PROPOFOL
Anesthesia: General

## 2016-07-31 ENCOUNTER — Ambulatory Visit
Admission: RE | Admit: 2016-07-31 | Discharge: 2016-07-31 | Disposition: A | Discharge status: Home / Self Care | Payer: Medicare Other | Source: Ambulatory Visit | Attending: Internal Medicine | Admitting: Internal Medicine

## 2016-07-31 DIAGNOSIS — Z1231 Encounter for screening mammogram for malignant neoplasm of breast: Secondary | ICD-10-CM | POA: Insufficient documentation

## 2016-10-05 IMAGING — US US RENAL
1 series · 14 of 25 positions shown · non-contrast
Comparison: CT [DATE]

CLINICAL DATA: Chronic kidney disease stage 3

EXAM:
RENAL / URINARY TRACT ULTRASOUND COMPLETE

[Series 1: us renal · 0.25mm/px · 14 of 32 slices shown]
[im 1/32]
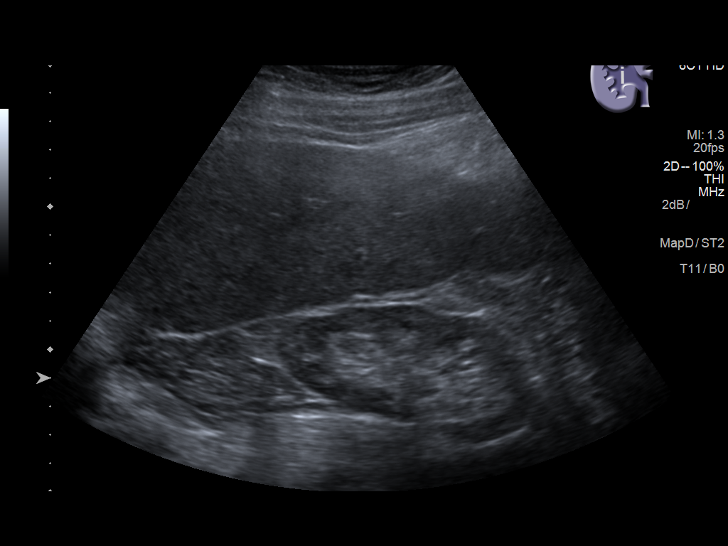
[im 3/32]
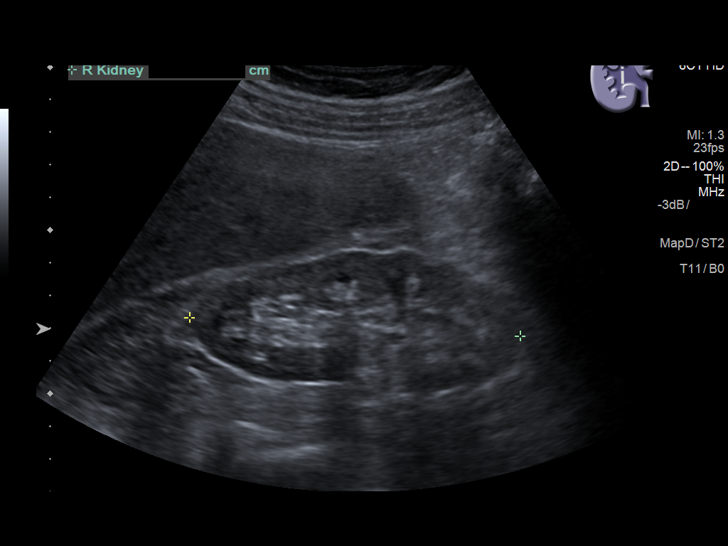
[im 6/32]
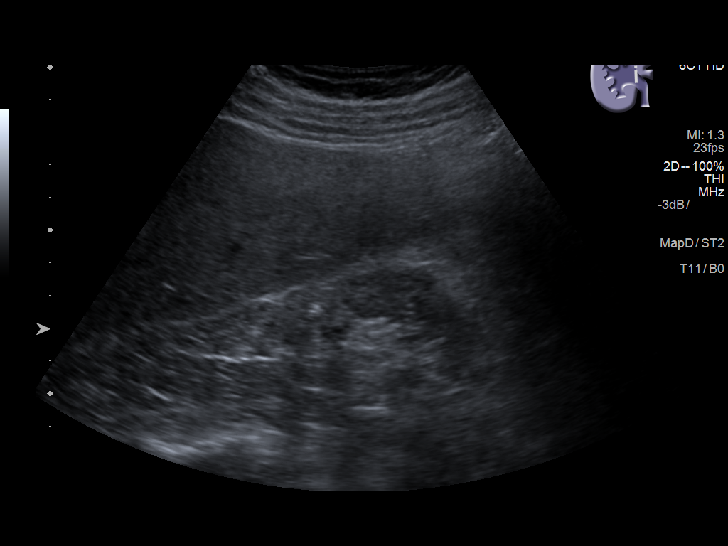
[im 8/32]
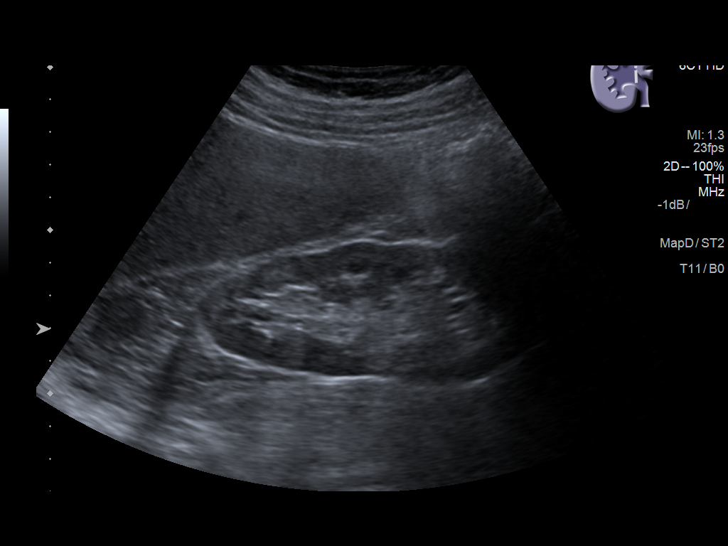
[im 11/32]
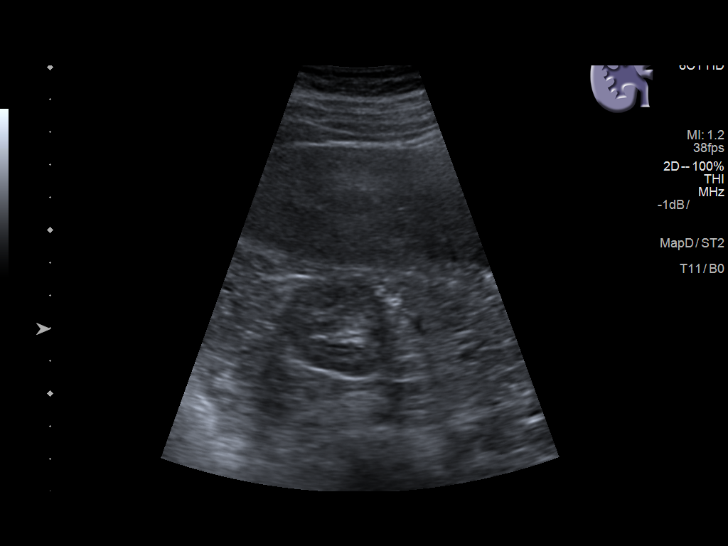
[im 12/32]
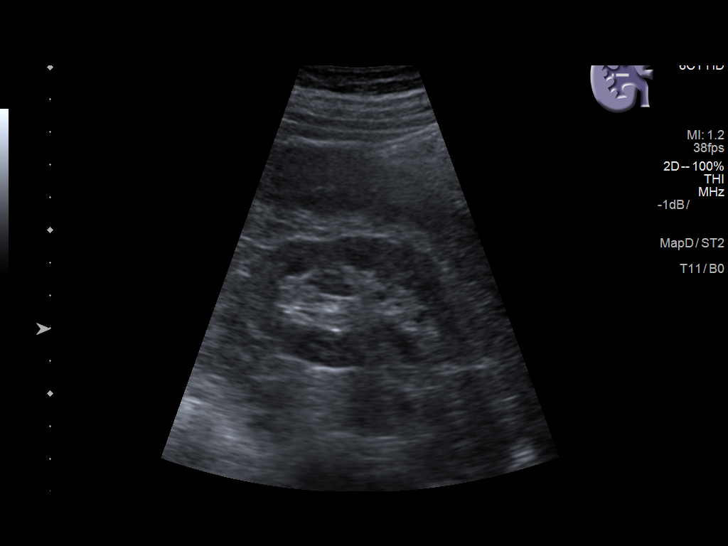
[im 15/32]
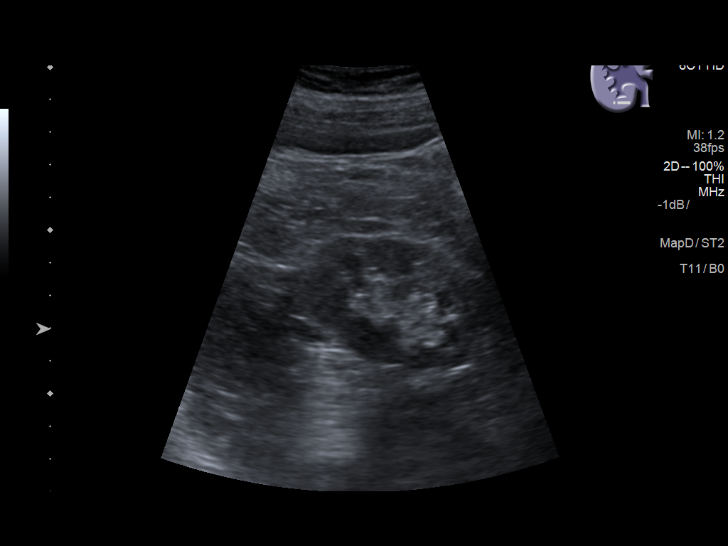
[im 17/32]
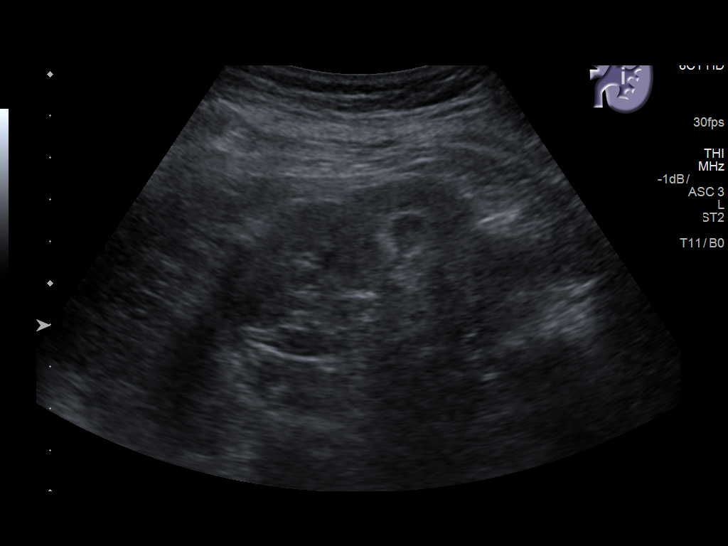
[im 20/32]
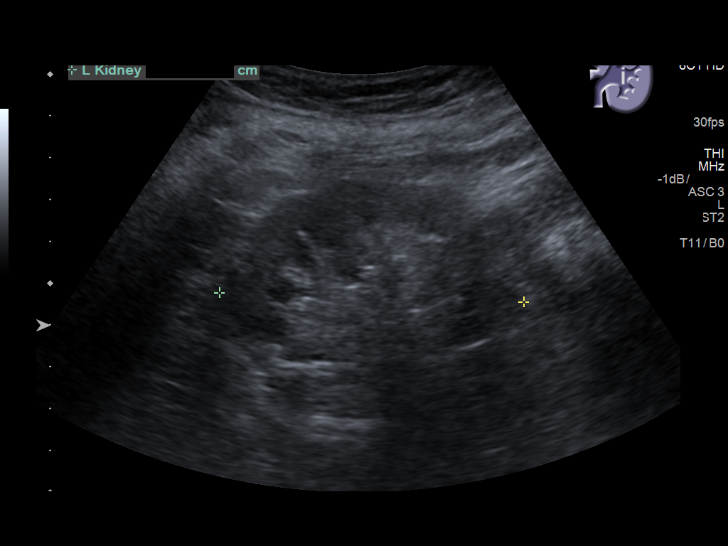
[im 21/32]
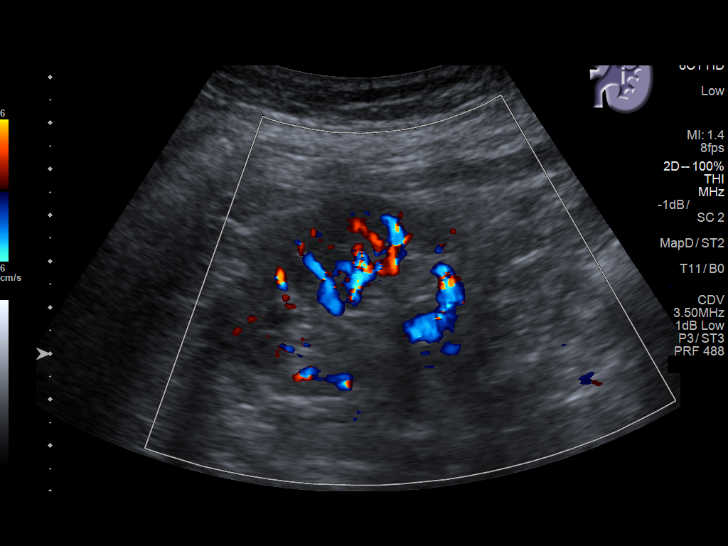
[im 24/32]
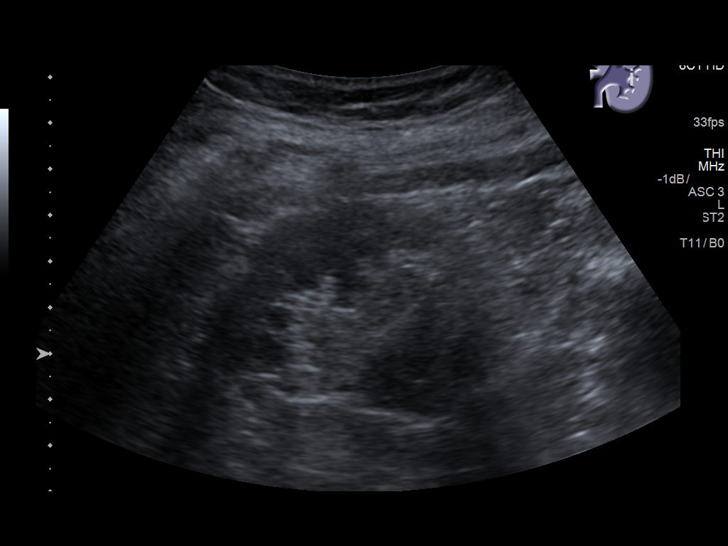
[im 26/32]
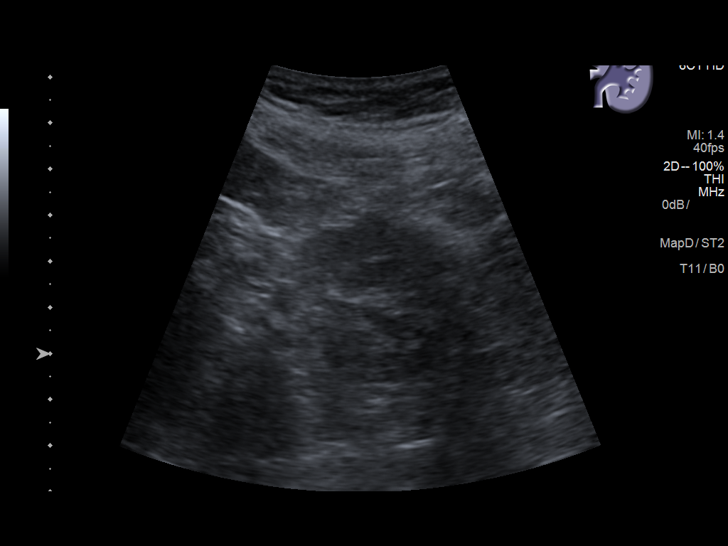
[im 29/32]
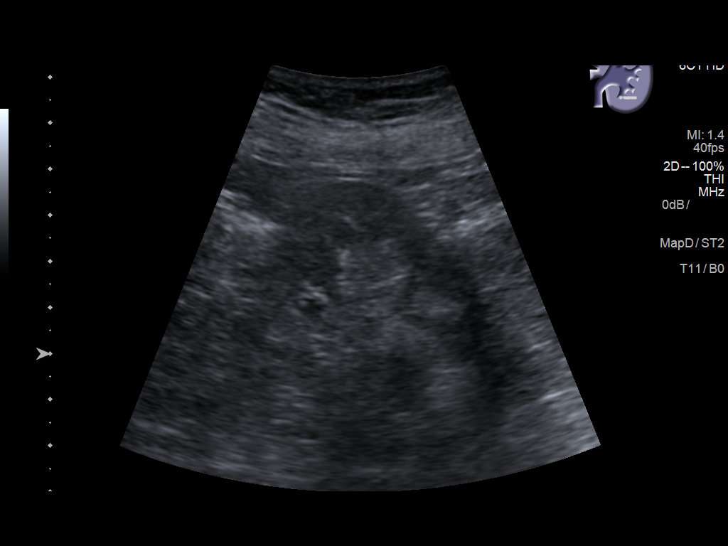
[im 32/32]
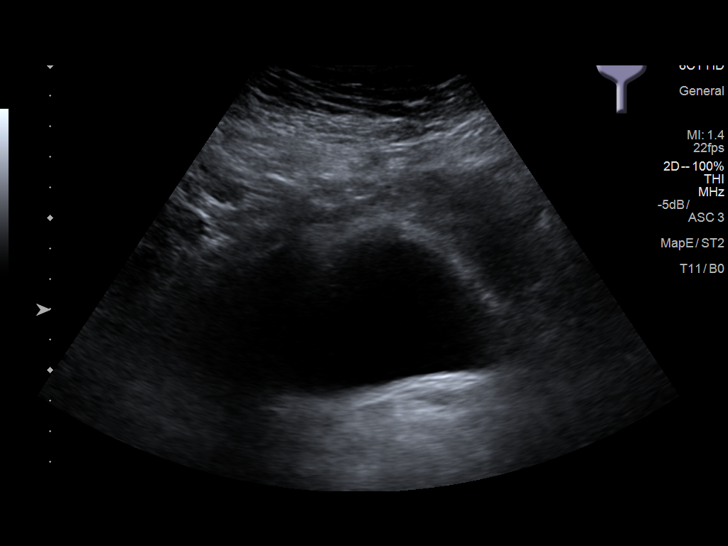

[14 of 25 positions shown; findings below may reference images not displayed]

FINDINGS: Right Kidney:

Length: 10.1 cm. Slight cortical thinning and increased echotexture.
No mass or hydronephrosis.

Left Kidney:

Length: 7.3 cm. Slight cortical thinning and increased echotexture.
No mass or hydronephrosis.

Bladder:

Appears normal for degree of bladder distention.
IMPRESSION: Slight cortical thinning and increased echotexture bilaterally
compatible chronic medical renal disease. No hydronephrosis.

## 2016-10-11 ENCOUNTER — Emergency Department
Admission: EM | Admit: 2016-10-11 | Discharge: 2016-10-11 | Disposition: A | Discharge status: Home / Self Care | Payer: Medicare Other | Attending: Emergency Medicine | Admitting: Emergency Medicine

## 2016-10-11 DIAGNOSIS — Z79899 Other long term (current) drug therapy: Secondary | ICD-10-CM | POA: Insufficient documentation

## 2016-10-11 DIAGNOSIS — F419 Anxiety disorder, unspecified: Secondary | ICD-10-CM

## 2016-10-11 DIAGNOSIS — Z7982 Long term (current) use of aspirin: Secondary | ICD-10-CM | POA: Insufficient documentation

## 2016-10-11 DIAGNOSIS — E119 Type 2 diabetes mellitus without complications: Secondary | ICD-10-CM | POA: Insufficient documentation

## 2016-10-11 DIAGNOSIS — Z7984 Long term (current) use of oral hypoglycemic drugs: Secondary | ICD-10-CM | POA: Insufficient documentation

## 2016-10-11 DIAGNOSIS — Z87891 Personal history of nicotine dependence: Secondary | ICD-10-CM | POA: Insufficient documentation

## 2016-10-11 DIAGNOSIS — F4325 Adjustment disorder with mixed disturbance of emotions and conduct: Secondary | ICD-10-CM | POA: Diagnosis not present

## 2016-10-11 DIAGNOSIS — E039 Hypothyroidism, unspecified: Secondary | ICD-10-CM | POA: Diagnosis not present

## 2016-10-11 DIAGNOSIS — Z853 Personal history of malignant neoplasm of breast: Secondary | ICD-10-CM | POA: Insufficient documentation

## 2016-10-11 DIAGNOSIS — I10 Essential (primary) hypertension: Secondary | ICD-10-CM | POA: Diagnosis present

## 2016-10-11 DIAGNOSIS — Z046 Encounter for general psychiatric examination, requested by authority: Secondary | ICD-10-CM | POA: Diagnosis present

## 2016-10-11 LAB — CBC
HEMATOCRIT: 42.3 % (ref 35.0–47.0)
Hemoglobin: 14.1 g/dL (ref 12.0–16.0)
MCH: 28.5 pg (ref 26.0–34.0)
MCHC: 33.4 g/dL (ref 32.0–36.0)
MCV: 85.4 fL (ref 80.0–100.0)
Platelets: 351 10*3/uL (ref 150–440)
RBC: 4.96 MIL/uL (ref 3.80–5.20)
RDW: 14.3 % (ref 11.5–14.5)
WBC: 8.7 10*3/uL (ref 3.6–11.0)

## 2016-10-11 LAB — URINE DRUG SCREEN, QUALITATIVE (ARMC ONLY)
AMPHETAMINES, UR SCREEN: NOT DETECTED
Barbiturates, Ur Screen: NOT DETECTED
Benzodiazepine, Ur Scrn: NOT DETECTED
Cannabinoid 50 Ng, Ur ~~LOC~~: NOT DETECTED
Cocaine Metabolite,Ur ~~LOC~~: NOT DETECTED
MDMA (ECSTASY) UR SCREEN: NOT DETECTED
METHADONE SCREEN, URINE: NOT DETECTED
OPIATE, UR SCREEN: NOT DETECTED
PHENCYCLIDINE (PCP) UR S: NOT DETECTED
Tricyclic, Ur Screen: NOT DETECTED

## 2016-10-11 LAB — COMPREHENSIVE METABOLIC PANEL
ALBUMIN: 4.7 g/dL (ref 3.5–5.0)
ALT: 12 U/L — ABNORMAL LOW (ref 14–54)
AST: 20 U/L (ref 15–41)
Alkaline Phosphatase: 39 U/L (ref 38–126)
Anion gap: 9 (ref 5–15)
BUN: 31 mg/dL — AB (ref 6–20)
CHLORIDE: 105 mmol/L (ref 101–111)
CO2: 24 mmol/L (ref 22–32)
Calcium: 10.3 mg/dL (ref 8.9–10.3)
Creatinine, Ser: 1.05 mg/dL — ABNORMAL HIGH (ref 0.44–1.00)
GFR calc Af Amer: >60 mL/min (ref >60)
GFR calc non Af Amer: 52 mL/min — ABNORMAL LOW (ref >60)
GLUCOSE: 108 mg/dL — AB (ref 65–99)
POTASSIUM: 4 mmol/L (ref 3.5–5.1)
SODIUM: 138 mmol/L (ref 135–145)
Total Bilirubin: 1 mg/dL (ref 0.3–1.2)
Total Protein: 8 g/dL (ref 6.5–8.1)

## 2016-10-11 LAB — ACETAMINOPHEN LEVEL

## 2016-10-11 LAB — ETHANOL: Alcohol, Ethyl (B): <5 mg/dL (ref <5)

## 2016-10-11 LAB — SALICYLATE LEVEL: Salicylate Lvl: <7 mg/dL (ref 2.8–30.0)

## 2016-10-11 MED ORDER — CLONAZEPAM 1 MG PO TABS: 1.0000 mg | ORAL_TABLET | Freq: Every day | ORAL | 0 refills | Status: DC

## 2016-10-11 MED ORDER — TRAZODONE HCL 100 MG PO TABS: 100.0000 mg | ORAL_TABLET | Freq: Every day | ORAL | 0 refills | Status: DC

## 2016-10-11 NOTE — ED Notes (Signed)
Pt up to shower

## 2016-10-11 NOTE — ED Notes (Addendum)
Pt. Verbalizes understanding of d/c instructions, prescriptions, and follow-up. VS stable and pain controlled per pt.  Pt. In NAD at time of d/c and denies further concerns regarding this visit. Pt. Stable at the time of departure from the unit, departing unit by the safest and most appropriate manner per that pt condition and limitations. Pt advised to return to the ED at any time for emergent concerns, or for new/worsening symptoms.   Provided cab voucher per Case Work.   Pharmacy called by this RN to ensure Rx per [place] will be delivered to pt home. Pharmacy confirmed.

## 2016-10-11 NOTE — Consult Note (Signed)
[facility] Face-to-Face Psychiatry Consult   Reason for Consult:  Consult for this 71 year old woman who came voluntarily to the emergency room with complaints about her mood and nerves Referring Physician:  McShane Patient Identification: AJAYA CRUTCHFIELD MRN:  [id number] Principal Diagnosis: Adjustment disorder with mixed disturbance of emotions and conduct Diagnosis:   Patient Active Problem List   Diagnosis Date Noted  . Adjustment disorder with mixed disturbance of emotions and conduct [F43.25] 10/11/2016  . Diabetes ([place]) [E11.9] 09/23/2014  . Bipolar 1 disorder, mixed ([place]) [F31.60]   . Essential hypertension [I10] 09/21/2014  . Hypothyroidism [E03.9] 09/21/2014  . Dyslipidemia [E78.5] 09/21/2014  . Bipolar 1 disorder, mixed, moderate ([place]) [F31.62] 09/20/2014  . Bipolar I disorder, current or most recent episode manic, severe with mixed features ([place]) [F31.13] 09/20/2014  . Bipolar disorder, mixed ([place]) [F31.60] 09/19/2014    Total Time spent with patient: 1 hour  Subjective:   LAURIANNE FLORESCA is a 71 y.o. female patient admitted with "I just don't know, I just want to do what I want with my life".  HPI:  Patient interviewed. Chart reviewed. Spent a fair bit of time talking with the patient making sure that I had a good picture of what brings her into the hospital. Patient called an ambulance to have her self brought to the hospital. As near as I can tell for the last few days she has just not been feeling completely right. She stopped taking her usual evening medicine about a week ago. From the way she describes it it sounds like she may have initially run out of it and neglected to get it refilled on time but then made a decision to just stay off that even though she could've gotten a refill. The medicine in question is her clonazepam and trazodone. She says that since being off it she still is sleeping okay and that she hasn't noticed any change in her mood. She denies feeling irritable or  having more problems with mood instability. Denies feeling depressed. Does not report any psychotic symptoms. Does not report any thoughts of self-harm suicide or assault to anyone else. What she does seem to be is a little uncomfortable with her decision. She talks about how she had made a tentative decision to stop going to [facility] because she had somehow gotten the idea that they were "sick" of her. She also ultimately was able to articulate a complaint that the therapist she is seeing makes her uncomfortable by insisting that the patient talk about things from her earlier life that just make her more anxious. She seems to want reassurance that she is allowed to make her own decisions and has a reasonable understanding of her own mood and thinking. She is not abusing any drugs or alcohol. Seems to be functioning well and is taking care of her medical problems the same as usual.  Social history: Older woman who lives by herself in an apartment. She says she is quite comfortable there and feels safe there. She has only sporadic contact with some of her siblings and other members of her family. Mostly stays isolated which is another issue she is uncomfortable with. She wishes that she could have some social contact with people but under her own terms but she is inhibited because of transportation problems.  Medical history: Patient has mild diabetes and hypertension and hypothyroidism. Reports that she is continuing her medicine as usual.  Substance abuse history: No alcohol or drug abuse past or present  Past  Psychiatric History: Patient has a long history of mental health problems and has been diagnosed with bipolar disorder in the past. For the last couple of years she has insisted on only taking modest doses of clonazepam and trazodone and yet seems to of stayed pretty stable. She has a distant past history of self injury but nothing recent. No real history of violence to others. Her last hospitalization was  here about 2 years ago  Risk to Self: Is patient at risk for suicide?: No Risk to Others:   Prior Inpatient Therapy:   Prior Outpatient Therapy:    Past Medical History:  Past Medical History:  Diagnosis Date  . Arthritis   . Cancer [facility]) 2004   right breast ca  . Diabetes mellitus without complication ([place])   . Hypercholesterolemia   . Hypertension   . Hypothyroid   . Seasonal allergies     Past Surgical History:  Procedure Laterality Date  . ABDOMINAL HYSTERECTOMY    . BREAST BIOPSY Left    neg  . BREAST EXCISIONAL BIOPSY Right 2004   breast ca radation and lumpectomy  . BREAST LUMPECTOMY Right 2004  . CHOLECYSTECTOMY    . KIDNEY STONE SURGERY     Family History: No family history on file. Family Psychiatric  History: Does not know of any family history Social History:  History  Alcohol Use No     History  Drug Use No    Social History   Social History  . Marital status: Divorced    Spouse name: N/A  . Number of children: N/A  . Years of education: N/A   Social History Main Topics  . Smoking status: Former Smoker    Types: Cigarettes  . Smokeless tobacco: Never Used  . Alcohol use No  . Drug use: No  . Sexual activity: Not Currently   Other Topics Concern  . Not on file   Social History Narrative  . No narrative on file   Additional Social History:    Allergies:   Allergies  Allergen Reactions  . Navane [Thiothixene] Other (See Comments)    Reaction:  Unknown  . Penicillins Rash    Has patient had a PCN reaction causing immediate rash, facial/tongue/throat swelling, SOB or lightheadedness with hypotension: No Has patient had a PCN reaction causing severe rash involving mucus membranes or skin necrosis: No Has patient had a PCN reaction that required hospitalization: No Has patient had a PCN reaction occurring within the last 10 years: No If all of the above answers are "NO", then may proceed with Cephalosporin use.     Labs:  Results  for orders placed or performed during the hospital encounter of 10/11/16 (from the past 48 hour(s))  Comprehensive metabolic panel     Status: Abnormal   Collection Time: 10/11/16 10:51 AM  Result Value Ref Range   Sodium 138 135 - 145 mmol/L   Potassium 4.0 3.5 - 5.1 mmol/L   Chloride 105 101 - 111 mmol/L   CO2 24 22 - 32 mmol/L   Glucose, Bld 108 (H) 65 - 99 mg/dL   BUN 31 (H) 6 - 20 mg/dL   Creatinine, Ser 1.05 (H) 0.44 - 1.00 mg/dL   Calcium 10.3 8.9 - 10.3 mg/dL   Total Protein 8.0 6.5 - 8.1 g/dL   Albumin 4.7 3.5 - 5.0 g/dL   AST 20 15 - 41 U/L   ALT 12 (L) 14 - 54 U/L   Alkaline Phosphatase 39 38 - 126  U/L   Total Bilirubin 1.0 0.3 - 1.2 mg/dL   GFR calc non Af Amer 52 (L) >60 mL/min   GFR calc Af Amer >60 >60 mL/min    Comment: (NOTE) The eGFR has been calculated using the CKD EPI equation. This calculation has not been validated in all clinical situations. eGFR's persistently <60 mL/min signify possible Chronic Kidney Disease.    Anion gap 9 5 - 15  Ethanol     Status: None   Collection Time: 10/11/16 10:51 AM  Result Value Ref Range   Alcohol, Ethyl (B) <5 <5 mg/dL    Comment:        LOWEST DETECTABLE LIMIT FOR SERUM ALCOHOL IS 5 mg/dL FOR MEDICAL PURPOSES ONLY   Salicylate level     Status: None   Collection Time: 10/11/16 10:51 AM  Result Value Ref Range   Salicylate Lvl <2.9 2.8 - 30.0 mg/dL  Acetaminophen level     Status: Abnormal   Collection Time: 10/11/16 10:51 AM  Result Value Ref Range   Acetaminophen (Tylenol), Serum <10 (L) 10 - 30 ug/mL    Comment:        THERAPEUTIC CONCENTRATIONS VARY SIGNIFICANTLY. A RANGE OF 10-30 ug/mL MAY BE AN EFFECTIVE CONCENTRATION FOR MANY PATIENTS. HOWEVER, SOME ARE BEST TREATED AT CONCENTRATIONS OUTSIDE THIS RANGE. ACETAMINOPHEN CONCENTRATIONS >150 ug/mL AT 4 HOURS AFTER INGESTION AND >50 ug/mL AT 12 HOURS AFTER INGESTION ARE OFTEN ASSOCIATED WITH TOXIC REACTIONS.   cbc     Status: None   Collection Time:  10/11/16 10:51 AM  Result Value Ref Range   WBC 8.7 3.6 - 11.0 K/uL   RBC 4.96 3.80 - 5.20 MIL/uL   Hemoglobin 14.1 12.0 - 16.0 g/dL   HCT 42.3 35.0 - 47.0 %   MCV 85.4 80.0 - 100.0 fL   MCH 28.5 26.0 - 34.0 pg   MCHC 33.4 32.0 - 36.0 g/dL   RDW 14.3 11.5 - 14.5 %   Platelets 351 150 - 440 K/uL  Urine Drug Screen, Qualitative     Status: None   Collection Time: 10/11/16 10:51 AM  Result Value Ref Range   Tricyclic, Ur Screen NONE DETECTED NONE DETECTED   Amphetamines, Ur Screen NONE DETECTED NONE DETECTED   MDMA (Ecstasy)Ur Screen NONE DETECTED NONE DETECTED   Cocaine Metabolite,Ur [place] NONE DETECTED NONE DETECTED   Opiate, Ur Screen NONE DETECTED NONE DETECTED   Phencyclidine (PCP) Ur S NONE DETECTED NONE DETECTED   Cannabinoid 50 Ng, Ur [place] NONE DETECTED NONE DETECTED   Barbiturates, Ur Screen NONE DETECTED NONE DETECTED   Benzodiazepine, Ur Scrn NONE DETECTED NONE DETECTED   Methadone Scn, Ur NONE DETECTED NONE DETECTED    Comment: (NOTE) 476  Tricyclics, urine               Cutoff 1000 ng/mL 200  Amphetamines, urine             Cutoff 1000 ng/mL 300  MDMA (Ecstasy), urine           Cutoff 500 ng/mL 400  Cocaine Metabolite, urine       Cutoff 300 ng/mL 500  Opiate, urine                   Cutoff 300 ng/mL 600  Phencyclidine (PCP), urine      Cutoff 25 ng/mL 700  Cannabinoid, urine              Cutoff 50 ng/mL 800  Barbiturates, urine  Cutoff 200 ng/mL 900  Benzodiazepine, urine           Cutoff 200 ng/mL 1000 Methadone, urine                Cutoff 300 ng/mL 1100 1200 The urine drug screen provides only a preliminary, unconfirmed 1300 analytical test result and should not be used for non-medical 1400 purposes. Clinical consideration and professional judgment should 1500 be applied to any positive drug screen result due to possible 1600 interfering substances. A more specific alternate chemical method 1700 must be used in order to obtain a confirmed analytical  result.  1800 Gas chromato graphy / mass spectrometry (GC/MS) is the preferred 1900 confirmatory method.     No current facility-administered medications for this encounter.    Current Outpatient Prescriptions  Medication Sig Dispense Refill  . aspirin 81 MG EC tablet Take 1 tablet (81 mg total) by mouth daily. Swallow whole. 30 tablet 12  . enalapril (VASOTEC) 20 MG tablet Take 1 tablet (20 mg total) by mouth 2 (two) times daily. 30 tablet 0  . levothyroxine (SYNTHROID, LEVOTHROID) 75 MCG tablet Take 1 tablet (75 mcg total) by mouth every morning. 30 tablet 0  . metFORMIN (GLUCOPHAGE) 1000 MG tablet Take 1 tablet (1,000 mg total) by mouth 2 (two) times daily. 60 tablet 0  . simvastatin (ZOCOR) 20 MG tablet Take 1 tablet (20 mg total) by mouth daily. 30 tablet 0  . clonazePAM (KLONOPIN) 1 MG tablet Take 1 tablet (1 mg total) by mouth at bedtime. 30 tablet 0  . traZODone (DESYREL) 100 MG tablet Take 1 tablet (100 mg total) by mouth at bedtime. 30 tablet 0    Musculoskeletal: Strength & Muscle Tone: within normal limits Gait & Station: normal Patient leans: N/A  Psychiatric Specialty Exam: Physical Exam  Nursing note and vitals reviewed. Constitutional: She appears well-developed and well-nourished.  HENT:  Head: Normocephalic and atraumatic.  Eyes: Conjunctivae are normal. Pupils are equal, round, and reactive to light.  Neck: Normal range of motion.  Cardiovascular: Regular rhythm and normal heart sounds.   Respiratory: Effort normal. No respiratory distress.  GI: Soft.  Musculoskeletal: Normal range of motion.  Neurological: She is alert.  Skin: Skin is warm and dry.  Psychiatric: Her behavior is normal. Judgment normal. Her affect is labile. Her affect is not inappropriate. Her speech is tangential. Thought content is not paranoid and not delusional. Cognition and memory are normal. She expresses no homicidal and no suicidal ideation.    Review of Systems  Constitutional:  Negative.   HENT: Negative.   Eyes: Negative.   Respiratory: Negative.   Cardiovascular: Negative.   Gastrointestinal: Negative.   Musculoskeletal: Negative.   Skin: Negative.   Neurological: Negative.   Psychiatric/Behavioral: Negative for depression, hallucinations, memory loss, substance abuse and suicidal ideas. The patient is nervous/anxious. The patient does not have insomnia.     Blood pressure (!) 148/96, pulse 79, temperature 98.4 F (36.9 C), temperature source Oral, resp. rate 17, height 5' 2"  (1.575 m), weight 64.9 kg (143 lb), SpO2 97 %.Body mass index is 26.16 kg/m.  General Appearance: Casual  Eye Contact:  Good  Speech:  Clear and Coherent  Volume:  Normal  Mood:  Euthymic  Affect:  Appropriate  Thought Process:  Goal Directed  Orientation:  Full (Time, Place, and Person)  Thought Content:  Logical and Tangential  Suicidal Thoughts:  No  Homicidal Thoughts:  No  Memory:  Immediate;   Good Recent;  Fair Remote;   Good  Judgement:  Good  Insight:  Good  Psychomotor Activity:  Normal  Concentration:  Concentration: Fair  Recall:  [employer] of Knowledge:  Fair  Language:  Fair  Akathisia:  No  Handed:  Right  AIMS (if indicated):     Assets:  Communication Skills Desire for Improvement Financial Resources/Insurance Housing Physical Health Resilience  ADL's:  Intact  Cognition:  WNL  Sleep:        Treatment Plan Summary: Medication management and Plan This is a 71 year old woman with a history of mood disorder who comes into the emergency room today but without a real crisis. She is not having a major depression or a mania. She is a little tangential and hyperverbal but not to a degree that I think his psychotic or worrisome. I think a lot of this is just anxiety about whether she is doing the right thing taking care of herself and wanting to get some reassurance. Certainly does not meet commitment criteria and does not require inpatient  hospitalization. Spent some time listening with her and giving her I think a lot of the appropriate validation that she needed. I did encourage her to consider taking her clonazepam and trazodone again at least until she can discuss it with Dr. Randel Books. She agrees that this is probably a reasonable idea so I went ahead and called them into medical [employer] for a month just to make sure that they are available. Patient has an appointment at [facility] to see her doctor in about 3 or 4 weeks. Encouraged her to keep the appointment. Patient otherwise can be released from the emergency room. Case reviewed with TTS and emergency room physician.  Disposition: Patient does not meet criteria for psychiatric inpatient admission. Supportive therapy provided about ongoing stressors. Discussed crisis plan, support from social network, calling 911, coming to the Emergency Department, and calling Suicide Hotline.  Alethia Berthold, MD 10/11/2016 1:56 PM

## 2016-10-11 NOTE — ED Provider Notes (Addendum)
[facility] Emergency Department Provider Note  ____________________________________________   I have reviewed the triage vital signs and the nursing notes.   HISTORY  Chief Complaint Medical Clearance    HPI [name] is a 71 y.o. female  who presents today complaining of feeling somewhat anxious in general. No SI no HI, no chest pain or shortness of breath. Patient states she just wants to talk to someone.     Past Medical History:  Diagnosis Date  . Arthritis   . Cancer [facility]) 2004   right breast ca  . Diabetes mellitus without complication ([place])   . Hypercholesterolemia   . Hypertension   . Hypothyroid   . Seasonal allergies     Patient Active Problem List   Diagnosis Date Noted  . 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 Diabetes ([place]) 09/23/2014  . Bipolar 1 disorder, mixed ([place])   . Essential hypertension 09/21/2014  . Hypothyroidism 09/21/2014  . Dyslipidemia 09/21/2014  . Bipolar 1 disorder, mixed, moderate ([place]) 09/20/2014  . Bipolar I disorder, current or most recent episode manic, severe with mixed features ([place]) 09/20/2014  . Bipolar disorder, mixed ([place]) 09/19/2014    Past Surgical History:  Procedure Laterality Date  . ABDOMINAL HYSTERECTOMY    . BREAST BIOPSY Left    neg  . BREAST EXCISIONAL BIOPSY Right 2004   breast ca radation and lumpectomy  . BREAST LUMPECTOMY Right 2004  . CHOLECYSTECTOMY    . KIDNEY STONE SURGERY      Prior to Admission medications   Medication Sig Start Date End Date Taking? Authorizing Provider  aspirin 81 MG EC tablet Take 1 tablet (81 mg total) by mouth daily. Swallow whole. 01/18/16  Yes Earleen Newport, MD  enalapril (VASOTEC) 20 MG tablet Take 1 tablet (20 mg total) by mouth 2 (two) times daily. 09/23/14  Yes Pucilowska, Jolanta B, MD  levothyroxine (SYNTHROID, LEVOTHROID) 75 MCG tablet Take 1 tablet (75 mcg total) by mouth every morning.  09/23/14  Yes Pucilowska, Jolanta B, MD  metFORMIN (GLUCOPHAGE) 1000 MG tablet Take 1 tablet (1,000 mg total) by mouth 2 (two) times daily. 09/23/14  Yes Pucilowska, Jolanta B, MD  simvastatin (ZOCOR) 20 MG tablet Take 1 tablet (20 mg total) by mouth daily. 09/23/14  Yes Pucilowska, Jolanta B, MD  clonazePAM (KLONOPIN) 1 MG tablet Take 1 tablet (1 mg total) by mouth at bedtime. 10/11/16   Clapacs, Madie Reno, MD  traZODone (DESYREL) 100 MG tablet Take 1 tablet (100 mg total) by mouth at bedtime. 10/11/16   Clapacs, Madie Reno, MD    Allergies Navane [thiothixene] and Penicillins  No family history on file.  Social History Social History  Substance Use Topics  . Smoking status: Former Smoker    Types: Cigarettes  . Smokeless tobacco: Never Used  . Alcohol use No    Review of Systems Constitutional: No fever/chills Eyes: No visual changes. ENT: No sore throat. No stiff neck no neck pain Cardiovascular: Denies chest pain. Respiratory: Denies shortness of breath. Gastrointestinal:   no vomiting.  No diarrhea.  No constipation. Genitourinary: Negative for dysuria. Musculoskeletal: Negative lower extremity swelling Skin: Negative for rash. Neurological: Negative for severe headaches, focal weakness or numbness. 10-point ROS otherwise negative.  ____________________________________________   PHYSICAL EXAM:  VITAL SIGNS: ED Triage Vitals [10/11/16 0949]  Enc Vitals Group     BP (!) 148/96     Pulse Rate 79     Resp 17  Temp 98.4 F (36.9 C)     Temp Source Oral     SpO2 97 %     Weight 143 lb (64.9 kg)     Height 5\' 2"  (1.575 m)     Head Circumference      Peak Flow      Pain Score      Pain Loc      Pain Edu?      Excl. in [place]?     Constitutional: Alert and oriented. Well appearing and in no acute distress. Eyes: Conjunctivae are normal.  Head: Atraumatic. Nose: No congestion/rhinnorhea. Mouth/Throat: Mucous membranes are moist.  Oropharynx non-erythematous. Neck: No  stridor.   Nontender with no meningismus Cardiovascular: Normal rate, regular rhythm. Grossly normal heart sounds.  Good peripheral circulation. Respiratory: Normal respiratory effort.  No retractions. Lungs CTAB. Abdominal: Soft and nontender. No distention. No guarding no rebound Back:  There is no focal tenderness or step off.  there is no midline tenderness there are no lesions noted. there is no CVA tenderness Musculoskeletal: No lower extremity tenderness, no upper extremity tenderness. No joint effusions, no DVT signs strong distal pulses no edema Neurologic:  Normal speech and language. No gross focal neurologic deficits are appreciated.  Skin:  Skin is warm, dry and intact. No rash noted. Psychiatric: Mood and affect are normal. Speech and behavior are normal.  ____________________________________________   LABS (all labs ordered are listed, but only abnormal results are displayed)  Labs Reviewed  COMPREHENSIVE METABOLIC PANEL - Abnormal; Notable for the following:       Result Value   Glucose, Bld 108 (*)    BUN 31 (*)    Creatinine, Ser 1.05 (*)    ALT 12 (*)    GFR calc non Af Amer 52 (*)    All other components within normal limits  ACETAMINOPHEN LEVEL - Abnormal; Notable for the following:    Acetaminophen (Tylenol), Serum <10 (*)    All other components within normal limits  ETHANOL  SALICYLATE LEVEL  CBC  URINE DRUG SCREEN, QUALITATIVE ([facility] ONLY)   ____________________________________________  EKG  I personally interpreted any EKGs ordered by me or triage  ____________________________________________  RADIOLOGY  I reviewed any imaging ordered by me or triage that were performed during my shift and, if possible, patient and/or family made aware of any abnormal findings. ____________________________________________   PROCEDURES  Procedure(s) performed: None  Procedures  Critical Care performed:  None  ____________________________________________   INITIAL IMPRESSION / ASSESSMENT AND PLAN / ED COURSE  Pertinent labs & imaging results that were available during my care of the patient were reviewed by me and considered in my medical decision making (see chart for details).   Patient in no acute distress, she apparently is here because she needs her medications refilled, she was seen by psychiatry, who did refill her medications for her. She has no evidence of withdrawal. She last took Xanax 10 days ago. She will follow up close with [facility], no SI no HI.    ____________________________________________   FINAL CLINICAL IMPRESSION(S) / ED DIAGNOSES  Final diagnoses:  None      This chart was dictated using voice recognition software.  Despite best efforts to proofread,  errors can occur which can change meaning.      Schuyler Amor, MD 10/11/16 1430    Schuyler Amor, MD 10/11/16 1430

## 2016-10-11 NOTE — ED Triage Notes (Signed)
Pt from home, pt has been out of meds since last Wednesday (klonopin and trazodone). Pt has hx of anxiety, depression. Pt reports anxious and anger due to having to call 911 because [facility] won't fill her meds anymore. Pt denies Si, HI or hallucinations.

## 2016-11-17 ENCOUNTER — Encounter: Payer: Self-pay | Admitting: Emergency Medicine

## 2016-11-17 ENCOUNTER — Emergency Department
Admission: EM | Admit: 2016-11-17 | Discharge: 2016-11-18 | Disposition: A | Discharge status: Home / Self Care | Payer: Medicare Other | Attending: Student in an Organized Health Care Education/Training Program | Admitting: Student in an Organized Health Care Education/Training Program

## 2016-11-17 DIAGNOSIS — F4325 Adjustment disorder with mixed disturbance of emotions and conduct: Secondary | ICD-10-CM | POA: Diagnosis present

## 2016-11-17 DIAGNOSIS — F329 Major depressive disorder, single episode, unspecified: Secondary | ICD-10-CM | POA: Insufficient documentation

## 2016-11-17 DIAGNOSIS — Z87891 Personal history of nicotine dependence: Secondary | ICD-10-CM | POA: Diagnosis not present

## 2016-11-17 DIAGNOSIS — E119 Type 2 diabetes mellitus without complications: Secondary | ICD-10-CM

## 2016-11-17 DIAGNOSIS — Z7982 Long term (current) use of aspirin: Secondary | ICD-10-CM | POA: Diagnosis not present

## 2016-11-17 DIAGNOSIS — F32A Depression, unspecified: Secondary | ICD-10-CM

## 2016-11-17 DIAGNOSIS — Z79899 Other long term (current) drug therapy: Secondary | ICD-10-CM | POA: Insufficient documentation

## 2016-11-17 DIAGNOSIS — Z853 Personal history of malignant neoplasm of breast: Secondary | ICD-10-CM | POA: Insufficient documentation

## 2016-11-17 DIAGNOSIS — E039 Hypothyroidism, unspecified: Secondary | ICD-10-CM | POA: Diagnosis present

## 2016-11-17 DIAGNOSIS — Z7984 Long term (current) use of oral hypoglycemic drugs: Secondary | ICD-10-CM | POA: Insufficient documentation

## 2016-11-17 DIAGNOSIS — I10 Essential (primary) hypertension: Secondary | ICD-10-CM | POA: Diagnosis present

## 2016-11-17 DIAGNOSIS — F319 Bipolar disorder, unspecified: Secondary | ICD-10-CM | POA: Diagnosis not present

## 2016-11-17 DIAGNOSIS — Z880 Allergy status to penicillin: Secondary | ICD-10-CM | POA: Diagnosis not present

## 2016-11-17 DIAGNOSIS — E1169 Type 2 diabetes mellitus with other specified complication: Secondary | ICD-10-CM

## 2016-11-17 LAB — URINALYSIS, COMPLETE (UACMP) WITH MICROSCOPIC
Bilirubin Urine: NEGATIVE
Glucose, UA: NEGATIVE mg/dL
Hgb urine dipstick: NEGATIVE
Ketones, ur: NEGATIVE mg/dL
Leukocytes, UA: NEGATIVE
NITRITE: NEGATIVE
PROTEIN: NEGATIVE mg/dL
RBC / HPF: NONE SEEN RBC/hpf (ref 0–5)
SPECIFIC GRAVITY, URINE: 1.009 (ref 1.005–1.030)
pH: 5 (ref 5.0–8.0)

## 2016-11-17 LAB — COMPREHENSIVE METABOLIC PANEL
ALBUMIN: 4.4 g/dL (ref 3.5–5.0)
ALT: 13 U/L — ABNORMAL LOW (ref 14–54)
AST: 18 U/L (ref 15–41)
Alkaline Phosphatase: 37 U/L — ABNORMAL LOW (ref 38–126)
Anion gap: 8 (ref 5–15)
BUN: 19 mg/dL (ref 6–20)
CHLORIDE: 104 mmol/L (ref 101–111)
CO2: 25 mmol/L (ref 22–32)
Calcium: 10.2 mg/dL (ref 8.9–10.3)
Creatinine, Ser: 1.14 mg/dL — ABNORMAL HIGH (ref 0.44–1.00)
GFR calc Af Amer: 55 mL/min — ABNORMAL LOW (ref >60)
GFR calc non Af Amer: 47 mL/min — ABNORMAL LOW (ref >60)
GLUCOSE: 95 mg/dL (ref 65–99)
POTASSIUM: 4.2 mmol/L (ref 3.5–5.1)
Sodium: 137 mmol/L (ref 135–145)
Total Bilirubin: 0.6 mg/dL (ref 0.3–1.2)
Total Protein: 7.6 g/dL (ref 6.5–8.1)

## 2016-11-17 LAB — CBC WITH DIFFERENTIAL/PLATELET
Basophils Absolute: 0.1 10*3/uL (ref 0–0.1)
Basophils Relative: 1 %
EOS PCT: 3 %
Eosinophils Absolute: 0.2 10*3/uL (ref 0–0.7)
HCT: 38.9 % (ref 35.0–47.0)
Hemoglobin: 13.4 g/dL (ref 12.0–16.0)
LYMPHS ABS: 3.4 10*3/uL (ref 1.0–3.6)
Lymphocytes Relative: 39 %
MCH: 29.7 pg (ref 26.0–34.0)
MCHC: 34.4 g/dL (ref 32.0–36.0)
MCV: 86.4 fL (ref 80.0–100.0)
MONO ABS: 0.5 10*3/uL (ref 0.2–0.9)
Monocytes Relative: 6 %
Neutro Abs: 4.4 10*3/uL (ref 1.4–6.5)
Neutrophils Relative %: 51 %
PLATELETS: 286 10*3/uL (ref 150–440)
RBC: 4.5 MIL/uL (ref 3.80–5.20)
RDW: 14.1 % (ref 11.5–14.5)
WBC: 8.7 10*3/uL (ref 3.6–11.0)

## 2016-11-17 LAB — URINE DRUG SCREEN, QUALITATIVE (ARMC ONLY)
AMPHETAMINES, UR SCREEN: NOT DETECTED
Barbiturates, Ur Screen: NOT DETECTED
Benzodiazepine, Ur Scrn: NOT DETECTED
COCAINE METABOLITE, UR ~~LOC~~: NOT DETECTED
Cannabinoid 50 Ng, Ur ~~LOC~~: NOT DETECTED
MDMA (ECSTASY) UR SCREEN: NOT DETECTED
Methadone Scn, Ur: NOT DETECTED
OPIATE, UR SCREEN: NOT DETECTED
PHENCYCLIDINE (PCP) UR S: NOT DETECTED
Tricyclic, Ur Screen: NOT DETECTED

## 2016-11-17 LAB — GLUCOSE, CAPILLARY: Glucose-Capillary: 136 mg/dL — ABNORMAL HIGH (ref 65–99)

## 2016-11-17 LAB — ETHANOL: Alcohol, Ethyl (B): <5 mg/dL (ref <5)

## 2016-11-17 LAB — ACETAMINOPHEN LEVEL: Acetaminophen (Tylenol), Serum: <10 ug/mL — ABNORMAL LOW (ref 10–30)

## 2016-11-17 LAB — SALICYLATE LEVEL: Salicylate Lvl: <7 mg/dL (ref 2.8–30.0)

## 2016-11-17 MED ORDER — TRAZODONE HCL 100 MG PO TABS
ORAL_TABLET | ORAL | Status: AC
  Filled 2016-11-17: qty 1

## 2016-11-17 MED ORDER — ASPIRIN EC 81 MG PO TBEC
81.0000 mg | DELAYED_RELEASE_TABLET | Freq: Every day | ORAL | Status: DC
  Administered 2016-11-18: 81 mg via ORAL
  Filled 2016-11-17: qty 1

## 2016-11-17 MED ORDER — CLONAZEPAM 1 MG PO TABS
ORAL_TABLET | ORAL | Status: AC
  Filled 2016-11-17: qty 1

## 2016-11-17 MED ORDER — SIMVASTATIN 40 MG PO TABS: 20.0000 mg | ORAL_TABLET | Freq: Every day | ORAL | Status: DC

## 2016-11-17 MED ORDER — LEVOTHYROXINE SODIUM 75 MCG PO TABS
75.0000 ug | ORAL_TABLET | Freq: Every day | ORAL | Status: DC
  Administered 2016-11-18: 75 ug via ORAL
  Filled 2016-11-17: qty 1

## 2016-11-17 MED ORDER — METFORMIN HCL 500 MG PO TABS
1000.0000 mg | ORAL_TABLET | Freq: Two times a day (BID) | ORAL | Status: DC
Start: 2016-11-18 — End: 2016-11-18
  Administered 2016-11-18: 1000 mg via ORAL
  Filled 2016-11-17: qty 2

## 2016-11-17 MED ORDER — TRAZODONE HCL 100 MG PO TABS
100.0000 mg | ORAL_TABLET | Freq: Every day | ORAL | Status: DC
  Administered 2016-11-17: 100 mg via ORAL

## 2016-11-17 MED ORDER — HALOPERIDOL LACTATE 5 MG/ML IJ SOLN
INTRAMUSCULAR | Status: AC
  Administered 2016-11-17: 5 mg via INTRAMUSCULAR
  Filled 2016-11-17: qty 1

## 2016-11-17 MED ORDER — LORAZEPAM 0.5 MG PO TABS
0.5000 mg | ORAL_TABLET | Freq: Once | ORAL | Status: DC
  Filled 2016-11-17: qty 1

## 2016-11-17 MED ORDER — CLONAZEPAM 1 MG PO TABS
1.0000 mg | ORAL_TABLET | Freq: Every day | ORAL | Status: DC
  Administered 2016-11-17: 1 mg via ORAL

## 2016-11-17 MED ORDER — HALOPERIDOL LACTATE 5 MG/ML IJ SOLN
5.0000 mg | Freq: Once | INTRAMUSCULAR | Status: AC
  Administered 2016-11-17: 5 mg via INTRAMUSCULAR

## 2016-11-17 MED ORDER — ENALAPRIL MALEATE 20 MG PO TABS
20.0000 mg | ORAL_TABLET | Freq: Two times a day (BID) | ORAL | Status: DC
  Administered 2016-11-18: 20 mg via ORAL
  Filled 2016-11-17 (×2): qty 1

## 2016-11-17 NOTE — ED Notes (Signed)

## 2016-11-17 NOTE — BH Assessment (Signed)
This pt refused to speak with TTS. Upon entering pt's room she said to this writer "GET OUT".  1st attempt unsuccessful.

## 2016-11-17 NOTE — ED Notes (Signed)
Pt changed into wine scrubs in quad bathroom with this tech present. Pt belongings consisted of shoes, shorts, shirt, underwear, watch and a pocketbook that is all placed in one bag and labeled with pt sticker and marked bag 1 of 1 and placed at quad RN desk to be locked up in [facility] locker room. Pt placed watch in her pocketbook. Pt given kleenex and head of bed put in upright position per pt request.

## 2016-11-17 NOTE — ED Notes (Signed)
Pt. Alert and oriented, warm and dry, in no distress. Pt. Denies SI, HI, and AVH. Pt. Encouraged to let nursing staff know of any concerns or needs. 

## 2016-11-17 NOTE — ED Notes (Signed)
Pt refusing soc - states "im a human being and will only speak to another human beings! I have rights!" pt very labile from crying to yelling. Attempted to medicate with po ativan but pt became more angry and yelling. Ordered changed to im haldol which the pt accepted.

## 2016-11-17 NOTE — ED Provider Notes (Signed)
[facility] Emergency Department Provider Note    First MD Initiated Contact with Patient 11/17/16 1504     (approximate)  I have reviewed the triage vital signs and the nursing notes.   HISTORY  Chief Complaint Depression    HPI [name] is a 71 y.o. female presents from home with a self-reported history of depression presents with worsening depression and paranoia and patient very agitated. Speaking very rapidly and none of her history is making any sense right now I can ascertain the patient is denying any sort of homicidal ideation or suicidal ideation at this moment. Denies hearing any voices.Since she has been seeing [facility] and they're telling her that her depression is worsening. States that she did stop taking her medications several weeks ago she didn't think that she was depressed.   Past Medical History:  Diagnosis Date  . Arthritis   . Cancer [facility]) 2004   right breast ca  . Diabetes mellitus without complication ([place])   . Hypercholesterolemia   . Hypertension   . Hypothyroid   . Seasonal allergies    History reviewed. No pertinent family history. Past Surgical History:  Procedure Laterality Date  . ABDOMINAL HYSTERECTOMY    . BREAST BIOPSY Left    neg  . BREAST EXCISIONAL BIOPSY Right 2004   breast ca radation and lumpectomy  . BREAST LUMPECTOMY Right 2004  . CHOLECYSTECTOMY    . KIDNEY STONE SURGERY     Patient Active Problem List   Diagnosis Date Noted  . 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 Diabetes ([place]) 09/23/2014  . Bipolar 1 disorder, mixed ([place])   . Essential hypertension 09/21/2014  . Hypothyroidism 09/21/2014  . Dyslipidemia 09/21/2014  . Bipolar 1 disorder, mixed, moderate ([place]) 09/20/2014  . Bipolar I disorder, current or most recent episode manic, severe with mixed features ([place]) 09/20/2014  . Bipolar disorder, mixed ([place]) 09/19/2014      Prior to Admission  medications   Medication Sig Start Date End Date Taking? Authorizing Provider  aspirin 81 MG EC tablet Take 1 tablet (81 mg total) by mouth daily. Swallow whole. 01/18/16   Earleen Newport, MD  clonazePAM (KLONOPIN) 1 MG tablet Take 1 tablet (1 mg total) by mouth at bedtime. 10/11/16   Clapacs, Madie Reno, MD  enalapril (VASOTEC) 20 MG tablet Take 1 tablet (20 mg total) by mouth 2 (two) times daily. 09/23/14   Pucilowska, Jolanta B, MD  levothyroxine (SYNTHROID, LEVOTHROID) 75 MCG tablet Take 1 tablet (75 mcg total) by mouth every morning. 09/23/14   Pucilowska, Herma Ard B, MD  metFORMIN (GLUCOPHAGE) 1000 MG tablet Take 1 tablet (1,000 mg total) by mouth 2 (two) times daily. 09/23/14   Pucilowska, Jolanta B, MD  simvastatin (ZOCOR) 20 MG tablet Take 1 tablet (20 mg total) by mouth daily. 09/23/14   Pucilowska, Herma Ard B, MD  traZODone (DESYREL) 100 MG tablet Take 1 tablet (100 mg total) by mouth at bedtime. 10/11/16   Clapacs, Madie Reno, MD    Allergies Navane [thiothixene] and Penicillins    Social History Social History  Substance Use Topics  . Smoking status: Former Smoker    Types: Cigarettes  . Smokeless tobacco: Never Used  . Alcohol use No    Review of Systems Patient denies headaches, rhinorrhea, blurry vision, numbness, shortness of breath, chest pain, edema, cough, abdominal pain, nausea, vomiting, diarrhea, dysuria, fevers, rashes or hallucinations unless otherwise stated above in HPI. ____________________________________________  PHYSICAL EXAM:  VITAL SIGNS: Vitals:   11/17/16 1452  BP: (!) 142/102  Pulse: 82  Resp: 18  Temp: 98.4 F (36.9 C)    Constitutional: Alert and oriented. Agitated and paranoid appearing. Eyes: Conjunctivae are normal.  Head: Atraumatic. Nose: No congestion/rhinnorhea. Mouth/Throat: Mucous membranes are moist.   Neck: No stridor. Painless ROM.  Cardiovascular: Normal rate, regular rhythm. Grossly normal heart sounds.  Good peripheral  circulation. Respiratory: Normal respiratory effort.  No retractions. Lungs CTAB. Gastrointestinal: Soft and nontender. No distention. No abdominal bruits. No CVA tenderness. Musculoskeletal: No lower extremity tenderness nor edema.  No joint effusions. Neurologic:  No gross focal neurologic deficits are appreciated. No facial droop Skin:  Skin is warm, dry and intact. No rash noted. Psychiatric: pressured speech, agitated, intermittently tearful and angry.  ____________________________________________   LABS (all labs ordered are listed, but only abnormal results are displayed)  Results for orders placed or performed during the hospital encounter of 11/17/16 (from the past 24 hour(s))  Comprehensive metabolic panel     Status: Abnormal   Collection Time: 11/17/16  3:10 PM  Result Value Ref Range   Sodium 137 135 - 145 mmol/L   Potassium 4.2 3.5 - 5.1 mmol/L   Chloride 104 101 - 111 mmol/L   CO2 25 22 - 32 mmol/L   Glucose, Bld 95 65 - 99 mg/dL   BUN 19 6 - 20 mg/dL   Creatinine, Ser 1.14 (H) 0.44 - 1.00 mg/dL   Calcium 10.2 8.9 - 10.3 mg/dL   Total Protein 7.6 6.5 - 8.1 g/dL   Albumin 4.4 3.5 - 5.0 g/dL   AST 18 15 - 41 U/L   ALT 13 (L) 14 - 54 U/L   Alkaline Phosphatase 37 (L) 38 - 126 U/L   Total Bilirubin 0.6 0.3 - 1.2 mg/dL   GFR calc non Af Amer 47 (L) >60 mL/min   GFR calc Af Amer 55 (L) >60 mL/min   Anion gap 8 5 - 15  Ethanol     Status: None   Collection Time: 11/17/16  3:10 PM  Result Value Ref Range   Alcohol, Ethyl (B) <5 <5 mg/dL  CBC with Diff     Status: None   Collection Time: 11/17/16  3:10 PM  Result Value Ref Range   WBC 8.7 3.6 - 11.0 K/uL   RBC 4.50 3.80 - 5.20 MIL/uL   Hemoglobin 13.4 12.0 - 16.0 g/dL   HCT 38.9 35.0 - 47.0 %   MCV 86.4 80.0 - 100.0 fL   MCH 29.7 26.0 - 34.0 pg   MCHC 34.4 32.0 - 36.0 g/dL   RDW 14.1 11.5 - 14.5 %   Platelets 286 150 - 440 K/uL   Neutrophils Relative % 51 %   Neutro Abs 4.4 1.4 - 6.5 K/uL   Lymphocytes  Relative 39 %   Lymphs Abs 3.4 1.0 - 3.6 K/uL   Monocytes Relative 6 %   Monocytes Absolute 0.5 0.2 - 0.9 K/uL   Eosinophils Relative 3 %   Eosinophils Absolute 0.2 0 - 0.7 K/uL   Basophils Relative 1 %   Basophils Absolute 0.1 0 - 0.1 K/uL  Acetaminophen level     Status: Abnormal   Collection Time: 11/17/16  3:10 PM  Result Value Ref Range   Acetaminophen (Tylenol), Serum <10 (L) 10 - 30 ug/mL  Salicylate level     Status: None   Collection Time: 11/17/16  3:10 PM  Result Value Ref Range  Salicylate Lvl <0.7 2.8 - 30.0 mg/dL  Urine Drug Screen, Qualitative     Status: None   Collection Time: 11/17/16  3:11 PM  Result Value Ref Range   Tricyclic, Ur Screen NONE DETECTED NONE DETECTED   Amphetamines, Ur Screen NONE DETECTED NONE DETECTED   MDMA (Ecstasy)Ur Screen NONE DETECTED NONE DETECTED   Cocaine Metabolite,Ur [place] NONE DETECTED NONE DETECTED   Opiate, Ur Screen NONE DETECTED NONE DETECTED   Phencyclidine (PCP) Ur S NONE DETECTED NONE DETECTED   Cannabinoid 50 Ng, Ur [place] NONE DETECTED NONE DETECTED   Barbiturates, Ur Screen NONE DETECTED NONE DETECTED   Benzodiazepine, Ur Scrn NONE DETECTED NONE DETECTED   Methadone Scn, Ur NONE DETECTED NONE DETECTED  Urinalysis, Complete w Microscopic     Status: Abnormal   Collection Time: 11/17/16  3:11 PM  Result Value Ref Range   Color, Urine STRAW (A) YELLOW   APPearance CLEAR (A) CLEAR   Specific Gravity, Urine 1.009 1.005 - 1.030   pH 5.0 5.0 - 8.0   Glucose, UA NEGATIVE NEGATIVE mg/dL   Hgb urine dipstick NEGATIVE NEGATIVE   Bilirubin Urine NEGATIVE NEGATIVE   Ketones, ur NEGATIVE NEGATIVE mg/dL   Protein, ur NEGATIVE NEGATIVE mg/dL   Nitrite NEGATIVE NEGATIVE   Leukocytes, UA NEGATIVE NEGATIVE   RBC / HPF NONE SEEN 0 - 5 RBC/hpf   WBC, UA 0-5 0 - 5 WBC/hpf   Bacteria, UA RARE (A) NONE SEEN   Squamous Epithelial / LPF 0-5 (A) NONE SEEN    ____________________________________________ ____________________________________________  RADIOLOGY   ____________________________________________   PROCEDURES  Procedure(s) performed:  Procedures    Critical Care performed: no ____________________________________________   INITIAL IMPRESSION / ASSESSMENT AND PLAN / ED COURSE  Pertinent labs & imaging results that were available during my care of the patient were reviewed by me and considered in my medical decision making (see chart for details).  DDX: Psychosis, delirium, medication effect, noncompliance, polysubstance abuse, Si, Hi, depression   Rudy Domek Reddin is a 71 y.o. who presents to the ED with for evaluation of depression and paranoia.  Patient has psych history of depression per patient.  Laboratory testing was ordered to evaluation for underlying electrolyte derangement or signs of underlying organic pathology to explain today's presentation.  Based on history and physical and laboratory evaluation, it appears that the patient's presentation is 2/2 underlying psychiatric disorder and will require further evaluation and management by inpatient psychiatry.  Patient was  made an IVC due to eratic behavior and paranoia.  Disposition pending psychiatric evaluation.       ____________________________________________   FINAL CLINICAL IMPRESSION(S) / ED DIAGNOSES  Final diagnoses:  Depression, unspecified depression type      NEW MEDICATIONS STARTED DURING THIS VISIT:  New Prescriptions   No medications on file     Note:  This document was prepared using Dragon voice recognition software and may include unintentional dictation errors.    Merlyn Lot, MD 11/17/16 [phone]

## 2016-11-17 NOTE — ED Notes (Signed)
Pt cooperative and pleasant with RN. Pt stated she came to the hospital because of depression. Pt denies SI/HI and AVH. Pt informed she would speak with a psychiatrist tomorrow. Pt accepting. Maintained on 15 minute checks and observation by security camera for safety.

## 2016-11-17 NOTE — ED Triage Notes (Signed)
Very emotional and states she is not hi/si and wants to know why her psychiatrist thinks she is paranoid
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
## 2016-11-17 NOTE — ED Notes (Signed)
Pt tearful and appears to want to talk. Pt given ginger ale and graham crackers. Pt apologized for being rude, but states she is just angry. Pt states her understanding of [place] helping her if she allows [place] to, but she has to talk to the doctor in order for that to happen. Pt said she understands that, but she is not talking to a machine. A hospital of this size and distinction should have a live doctor here to talk to per patient comments.

## 2016-11-17 NOTE — ED Notes (Signed)
Pt given dinner tray.

## 2016-11-17 NOTE — ED Notes (Signed)
ED BHU [place] Is the patient under IVC or is there intent for IVC: Yes.   Is the patient medically cleared: Yes.   Is there vacancy in the ED BHU: Yes.   Is the population mix appropriate for patient: Yes.   Is the patient awaiting placement in inpatient or outpatient setting: No. Has the patient had a psychiatric consult: No. Survey of unit performed for contraband, proper placement and condition of furniture, tampering with fixtures in bathroom, shower, and each patient room: Yes.  ; Findings: NA APPEARANCE/BEHAVIOR calm, cooperative and adequate rapport can be established NEURO ASSESSMENT Orientation: time, place and person Hallucinations: No.None noted (Hallucinations) Speech: Normal Gait: normal RESPIRATORY ASSESSMENT Normal expansion.  Clear to auscultation.  No rales, rhonchi, or wheezing. CARDIOVASCULAR ASSESSMENT regular rate and rhythm, S1, S2 normal, no murmur, click, rub or gallop GASTROINTESTINAL ASSESSMENT soft, nontender, BS WNL, no r/g EXTREMITIES normal strength, tone, and muscle mass PLAN OF CARE Provide calm/safe environment. Vital signs assessed twice daily. ED BHU Assessment once each 12-hour shift. Collaborate with intake RN daily or as condition indicates. Assure the ED provider has rounded once each shift. Provide and encourage hygiene. Provide redirection as needed. Assess for escalating behavior; address immediately and inform ED provider.  Assess family dynamic and appropriateness for visitation as needed: Yes.  ; If necessary, describe findings: NA Educate the patient/family about BHU procedures/visitation: Yes.  ; If necessary, describe findings: NA

## 2016-11-18 DIAGNOSIS — F4325 Adjustment disorder with mixed disturbance of emotions and conduct: Secondary | ICD-10-CM

## 2016-11-18 DIAGNOSIS — F329 Major depressive disorder, single episode, unspecified: Secondary | ICD-10-CM | POA: Diagnosis not present

## 2016-11-18 LAB — GLUCOSE, CAPILLARY: Glucose-Capillary: 131 mg/dL — ABNORMAL HIGH (ref 65–99)

## 2016-11-18 NOTE — ED Notes (Signed)
Patient presents this am pleasant and cooperative care. Medication compliant. Pt reports feeling better today. Main stressor is transportation issues and [place] transit is not dependable and having to walk in high temperatures. Pt denies SI, HI, AVH. Reports have not been suicidal since her younger years. Patient upset about breakfast tray this am. Patient reports being ready for discharge. Encouragement and support offered. Safety checks maintained. Pt remains safe on unit with q 15 min checks.

## 2016-11-18 NOTE — ED Notes (Signed)
In bed resting, eyes open in no distress

## 2016-11-18 NOTE — ED Notes (Signed)
Patient in community room in no distress. Calm, cooperative.

## 2016-11-18 NOTE — ED Provider Notes (Signed)
-----------------------------------------   6:09 AM on 11/18/2016 -----------------------------------------   Blood pressure 114/77, pulse 63, temperature 97.5 F (36.4 C), temperature source Oral, resp. rate 18, height 1.549 m (5\' 1" ), weight 64.9 kg (143 lb), SpO2 97 %.  The patient had no acute events since last update.  Calm and cooperative at this time.  Disposition is pending Psychiatry/Behavioral Medicine team recommendations.     Hinda Kehr, MD 11/18/16 [phone]

## 2016-11-18 NOTE — ED Notes (Signed)
IVC  PAPERS  RESCINDED  PER  DR  CLAPACS

## 2016-11-18 NOTE — ED Notes (Signed)
Patient in bed resting, eyes closed in no distress.

## 2016-11-18 NOTE — Discharge Instructions (Signed)
Please follow-up at [facility] tomorrow for reevaluation and return to the emergency department for any concerns.

## 2016-11-18 NOTE — ED Notes (Signed)
Meal tray given 

## 2016-11-18 NOTE — ED Notes (Signed)
Patient is resting comfortably. 

## 2016-11-18 NOTE — ED Provider Notes (Signed)
IVC lifted by Dr. Weber Cooks.   [name], MD 11/18/16 1401

## 2016-11-18 NOTE — ED Notes (Signed)
Patient up in community room. Patient had shower and bed linen change. Denies SI, HI,AVH.

## 2016-11-18 NOTE — ED Notes (Signed)
Patient discharged home. DC instructions provided and explained. Pt stable at discharge. Denies SI, HI, AVH.

## 2016-11-18 NOTE — ED Notes (Signed)
In dayroom eating breakfast In no distress.

## 2016-11-18 NOTE — Consult Note (Signed)
[facility] Consult   Reason for Consult:  Consult for 71 year old woman with a history of mood instability who came into the hospital because of worsening irritability Referring Physician:  R[place]bark Patient Identification: [name] MRN:  [id number] Principal Diagnosis: Adjustment disorder with mixed disturbance of emotions and conduct Diagnosis:   Patient Active Problem List   Diagnosis Date Noted  . Adjustment disorder with mixed disturbance of emotions and conduct [F43.25] 10/11/2016  . Diabetes ([place]) [E11.9] 09/23/2014  . Bipolar 1 disorder, mixed ([place]) [F31.60]   . Essential hypertension [I10] 09/21/2014  . Hypothyroidism [E03.9] 09/21/2014  . Dyslipidemia [E78.5] 09/21/2014  . Bipolar 1 disorder, mixed, moderate ([place]) [F31.62] 09/20/2014  . Bipolar I disorder, current or most recent episode manic, severe with mixed features ([place]) [F31.13] 09/20/2014  . Bipolar disorder, mixed ([place]) [F31.60] 09/19/2014    Total Time spent with patient: 1 hour  Subjective:   [name] is a 71 y.o. female patient admitted with "I just get so frustrated".  HPI:  Patient interviewed chart reviewed. 71 year old woman well known to me from previous encounters. She tells me about how recently she has had multiple frustrating experiences a lot of which revolve around transportation in the community. Evidently the last straw was that over the weekend she called for a taxi cab to take her to the grocery store and when they did not show up on time she got so angry she called and canceled him and then felt even more upset. She admits today that a big part of this is her own problem with dealing with frustration. Patient denies having any suicidal or homicidal thoughts. Evidently when she first came into the emergency room she was making some inflammatory histrionic statements but she denies that she had ever had any intention of doing any harm. She tells me that she has been compliant  with her psychiatric medicine since I saw her last and is starting to go back to [place]. Denies that she is abusing any other drugs. She says that overall she sleeps pretty well with her current medicine. Her appetite has been decreased for the last couple months and she has noticed that her blood sugars are running low. She mentions to me that she has had multiple times that she has checked her blood sugar and had it down as low as in the mid 60s.  Social history: Lives by herself. Has s a peer support provider from [place].  Medical history: Patient has type 2 diabetes management with diet and metformin. From what she describes she's been eating much less recently and losing some weight and her blood sugars have been running low. I pointed out to her that this could be a contributing factor to her temper and irritability. She also has chronic hypothyroidism.  Substance abuse history: No recent history of any drug or alcohol abuse. Manages to take Klonopin regularly without any abuse problem  Past Psychiatric History: Patient has a history of bipolar disorder. Has had several prior hospitalizations. She has declined to take any medicine for her mood other than clonazepam and trazodone for years and generally seems to do okay with that. She usually follows up pretty well with [facility]. Has had a history of suicidal threats in the past. No recent suicide attempts.  Risk to Self: Suicidal Ideation: No Suicidal Intent: No Is patient at risk for suicide?: No Suicidal Plan?: No Access to Means: No What has been your use of drugs/alcohol within the last 12 months?:  none How many times?: 0 Other Self Harm Risks: none Triggers for Past Attempts: Unknown Intentional Self Injurious Behavior: None Risk to Others: Homicidal Ideation: No Thoughts of Harm to Others: No Current Homicidal Intent: No Current Homicidal Plan: No Access to Homicidal Means: No Identified Victim: NA History of harm to others?:  No Assessment of Violence: None Noted Violent Behavior Description: history of yelling and getting defiant Does patient have access to weapons?: No Criminal Charges Pending?: No Does patient have a court date: No Prior Inpatient Therapy: Prior Inpatient Therapy: Yes Prior Therapy Dates: 2016 Prior Therapy Facilty/Provider(s): [facility] Reason for Treatment: SI Prior Outpatient Therapy: Prior Outpatient Therapy: Yes Prior Therapy Dates: ongoing Prior Therapy Facilty/Provider(s): [facility] Reason for Treatment: bipolar Does patient have an ACCT team?: No Does patient have Intensive In-House Services?  : No Does patient have [facility] services? : No Does patient have P4CC services?: No  Past Medical History:  Past Medical History:  Diagnosis Date  . Arthritis   . Cancer [facility]) 2004   right breast ca  . Diabetes mellitus without complication ([place])   . Hypercholesterolemia   . Hypertension   . Hypothyroid   . Seasonal allergies     Past Surgical History:  Procedure Laterality Date  . ABDOMINAL HYSTERECTOMY    . BREAST BIOPSY Left    neg  . BREAST EXCISIONAL BIOPSY Right 2004   breast ca radation and lumpectomy  . BREAST LUMPECTOMY Right 2004  . CHOLECYSTECTOMY    . KIDNEY STONE SURGERY     Family History: History reviewed. No pertinent family history. Family Psychiatric  History: None known Social History:  History  Alcohol Use No     History  Drug Use No    Social History   Social History  . Marital status: Divorced    Spouse name: N/A  . Number of children: N/A  . Years of education: N/A   Social History Main Topics  . Smoking status: Former Smoker    Types: Cigarettes  . Smokeless tobacco: Never Used  . Alcohol use No  . Drug use: No  . Sexual activity: Not Currently   Other Topics Concern  . None   Social History Narrative  . None   Additional Social History:    Allergies:   Allergies  Allergen Reactions  . Navane [Thiothixene] Other (See Comments)     Reaction:  Unknown  . Penicillins Rash    Has patient had a PCN reaction causing immediate rash, facial/tongue/throat swelling, SOB or lightheadedness with hypotension: No Has patient had a PCN reaction causing severe rash involving mucus membranes or skin necrosis: No Has patient had a PCN reaction that required hospitalization: No Has patient had a PCN reaction occurring within the last 10 years: No If all of the above answers are "NO", then may proceed with Cephalosporin use.     Labs:  Results for orders placed or performed during the hospital encounter of 11/17/16 (from the past 48 hour(s))  Comprehensive metabolic panel     Status: Abnormal   Collection Time: 11/17/16  3:10 PM  Result Value Ref Range   Sodium 137 135 - 145 mmol/L   Potassium 4.2 3.5 - 5.1 mmol/L   Chloride 104 101 - 111 mmol/L   CO2 25 22 - 32 mmol/L   Glucose, Bld 95 65 - 99 mg/dL   BUN 19 6 - 20 mg/dL   Creatinine, Ser 1.14 (H) 0.44 - 1.00 mg/dL   Calcium 10.2 8.9 - 10.3 mg/dL  Total Protein 7.6 6.5 - 8.1 g/dL   Albumin 4.4 3.5 - 5.0 g/dL   AST 18 15 - 41 U/L   ALT 13 (L) 14 - 54 U/L   Alkaline Phosphatase 37 (L) 38 - 126 U/L   Total Bilirubin 0.6 0.3 - 1.2 mg/dL   GFR calc non Af Amer 47 (L) >60 mL/min   GFR calc Af Amer 55 (L) >60 mL/min    Comment: (NOTE) The eGFR has been calculated using the CKD EPI equation. This calculation has not been validated in all clinical situations. eGFR's persistently <60 mL/min signify possible Chronic Kidney Disease.    Anion gap 8 5 - 15  Ethanol     Status: None   Collection Time: 11/17/16  3:10 PM  Result Value Ref Range   Alcohol, Ethyl (B) <5 <5 mg/dL    Comment:        LOWEST DETECTABLE LIMIT FOR SERUM ALCOHOL IS 5 mg/dL FOR MEDICAL PURPOSES ONLY   CBC with Diff     Status: None   Collection Time: 11/17/16  3:10 PM  Result Value Ref Range   WBC 8.7 3.6 - 11.0 K/uL   RBC 4.50 3.80 - 5.20 MIL/uL   Hemoglobin 13.4 12.0 - 16.0 g/dL   HCT 38.9 35.0 -  47.0 %   MCV 86.4 80.0 - 100.0 fL   MCH 29.7 26.0 - 34.0 pg   MCHC 34.4 32.0 - 36.0 g/dL   RDW 14.1 11.5 - 14.5 %   Platelets 286 150 - 440 K/uL   Neutrophils Relative % 51 %   Neutro Abs 4.4 1.4 - 6.5 K/uL   Lymphocytes Relative 39 %   Lymphs Abs 3.4 1.0 - 3.6 K/uL   Monocytes Relative 6 %   Monocytes Absolute 0.5 0.2 - 0.9 K/uL   Eosinophils Relative 3 %   Eosinophils Absolute 0.2 0 - 0.7 K/uL   Basophils Relative 1 %   Basophils Absolute 0.1 0 - 0.1 K/uL  Acetaminophen level     Status: Abnormal   Collection Time: 11/17/16  3:10 PM  Result Value Ref Range   Acetaminophen (Tylenol), Serum <10 (L) 10 - 30 ug/mL    Comment:        THERAPEUTIC CONCENTRATIONS VARY SIGNIFICANTLY. A RANGE OF 10-30 ug/mL MAY BE AN EFFECTIVE CONCENTRATION FOR MANY PATIENTS. HOWEVER, SOME ARE BEST TREATED AT CONCENTRATIONS OUTSIDE THIS RANGE. ACETAMINOPHEN CONCENTRATIONS >150 ug/mL AT 4 HOURS AFTER INGESTION AND >50 ug/mL AT 12 HOURS AFTER INGESTION ARE OFTEN ASSOCIATED WITH TOXIC REACTIONS.   Salicylate level     Status: None   Collection Time: 11/17/16  3:10 PM  Result Value Ref Range   Salicylate Lvl <4.8 2.8 - 30.0 mg/dL  Urine Drug Screen, Qualitative     Status: None   Collection Time: 11/17/16  3:11 PM  Result Value Ref Range   Tricyclic, Ur Screen NONE DETECTED NONE DETECTED   Amphetamines, Ur Screen NONE DETECTED NONE DETECTED   MDMA (Ecstasy)Ur Screen NONE DETECTED NONE DETECTED   Cocaine Metabolite,Ur [place] NONE DETECTED NONE DETECTED   Opiate, Ur Screen NONE DETECTED NONE DETECTED   Phencyclidine (PCP) Ur S NONE DETECTED NONE DETECTED   Cannabinoid 50 Ng, Ur [place] NONE DETECTED NONE DETECTED   Barbiturates, Ur Screen NONE DETECTED NONE DETECTED   Benzodiazepine, Ur Scrn NONE DETECTED NONE DETECTED   Methadone Scn, Ur NONE DETECTED NONE DETECTED    Comment: (NOTE) 270  Tricyclics, urine  Cutoff 1000 ng/mL 200  Amphetamines, urine             Cutoff 1000 ng/mL 300   MDMA (Ecstasy), urine           Cutoff 500 ng/mL 400  Cocaine Metabolite, urine       Cutoff 300 ng/mL 500  Opiate, urine                   Cutoff 300 ng/mL 600  Phencyclidine (PCP), urine      Cutoff 25 ng/mL 700  Cannabinoid, urine              Cutoff 50 ng/mL 800  Barbiturates, urine             Cutoff 200 ng/mL 900  Benzodiazepine, urine           Cutoff 200 ng/mL 1000 Methadone, urine                Cutoff 300 ng/mL 1100 1200 The urine drug screen provides only a preliminary, unconfirmed 1300 analytical test result and should not be used for non-medical 1400 purposes. Clinical consideration and professional judgment should 1500 be applied to any positive drug screen result due to possible 1600 interfering substances. A more specific alternate chemical method 1700 must be used in order to obtain a confirmed analytical result.  1800 Gas chromato graphy / mass spectrometry (GC/MS) is the preferred 1900 confirmatory method.   Urinalysis, Complete w Microscopic     Status: Abnormal   Collection Time: 11/17/16  3:11 PM  Result Value Ref Range   Color, Urine STRAW (A) YELLOW   APPearance CLEAR (A) CLEAR   Specific Gravity, Urine 1.009 1.005 - 1.030   pH 5.0 5.0 - 8.0   Glucose, UA NEGATIVE NEGATIVE mg/dL   Hgb urine dipstick NEGATIVE NEGATIVE   Bilirubin Urine NEGATIVE NEGATIVE   Ketones, ur NEGATIVE NEGATIVE mg/dL   Protein, ur NEGATIVE NEGATIVE mg/dL   Nitrite NEGATIVE NEGATIVE   Leukocytes, UA NEGATIVE NEGATIVE   RBC / HPF NONE SEEN 0 - 5 RBC/hpf   WBC, UA 0-5 0 - 5 WBC/hpf   Bacteria, UA RARE (A) NONE SEEN   Squamous Epithelial / LPF 0-5 (A) NONE SEEN  Glucose, capillary     Status: Abnormal   Collection Time: 11/17/16  8:36 PM  Result Value Ref Range   Glucose-Capillary 136 (H) 65 - 99 mg/dL  Glucose, capillary     Status: Abnormal   Collection Time: 11/18/16  7:41 AM  Result Value Ref Range   Glucose-Capillary 131 (H) 65 - 99 mg/dL    Current Facility-Administered  Medications  Medication Dose Route Frequency Provider Last Rate Last Dose  . aspirin EC tablet 81 mg  81 mg Oral Daily Merlyn Lot, MD   81 mg at 11/18/16 0831  . clonazePAM (KLONOPIN) tablet 1 mg  1 mg Oral QHS Merlyn Lot, MD   1 mg at 11/17/16 2101  . enalapril (VASOTEC) tablet 20 mg  20 mg Oral BID Merlyn Lot, MD   20 mg at 11/18/16 0831  . levothyroxine (SYNTHROID, LEVOTHROID) tablet 75 mcg  75 mcg Oral QAC breakfast Merlyn Lot, MD   75 mcg at 11/18/16 0558  . metFORMIN (GLUCOPHAGE) tablet 1,000 mg  1,000 mg Oral BID WC Merlyn Lot, MD   1,000 mg at 11/18/16 0831  . simvastatin (ZOCOR) tablet 20 mg  20 mg Oral q1800 Merlyn Lot, MD      . traZODone [facility])  tablet 100 mg  100 mg Oral QHS Merlyn Lot, MD   100 mg at 11/17/16 2100   Current Outpatient Prescriptions  Medication Sig Dispense Refill  . aspirin 81 MG EC tablet Take 1 tablet (81 mg total) by mouth daily. Swallow whole. 30 tablet 12  . clonazePAM (KLONOPIN) 1 MG tablet Take 1 tablet (1 mg total) by mouth at bedtime. 30 tablet 0  . enalapril (VASOTEC) 20 MG tablet Take 1 tablet (20 mg total) by mouth 2 (two) times daily. 30 tablet 0  . levothyroxine (SYNTHROID, LEVOTHROID) 75 MCG tablet Take 1 tablet (75 mcg total) by mouth every morning. 30 tablet 0  . metFORMIN (GLUCOPHAGE) 1000 MG tablet Take 1 tablet (1,000 mg total) by mouth 2 (two) times daily. 60 tablet 0  . simvastatin (ZOCOR) 20 MG tablet Take 1 tablet (20 mg total) by mouth daily. 30 tablet 0  . traZODone (DESYREL) 100 MG tablet Take 1 tablet (100 mg total) by mouth at bedtime. 30 tablet 0    Musculoskeletal: Strength & Muscle Tone: within normal limits Gait & Station: normal Patient leans: N/A  Psychiatric Specialty Exam: Physical Exam  Nursing note and vitals reviewed. Constitutional: She appears well-developed and well-nourished.    HENT:  Head: Normocephalic and atraumatic.  Eyes: Conjunctivae are normal. Pupils  are equal, round, and reactive to light.  Neck: Normal range of motion.  Cardiovascular: Normal heart sounds.   Respiratory: Effort normal.  GI: Soft.  Musculoskeletal: Normal range of motion.  Neurological: She is alert.  Skin: Skin is warm and dry.  Psychiatric: Her speech is normal and behavior is normal. Her mood appears anxious. Thought content is not paranoid. Cognition and memory are normal. She expresses impulsivity. She expresses no homicidal and no suicidal ideation.    Review of Systems  Constitutional: Negative.   HENT: Negative.   Eyes: Negative.   Respiratory: Negative.   Cardiovascular: Negative.   Gastrointestinal: Negative.   Musculoskeletal: Negative.   Skin: Negative.   Neurological: Negative.   Psychiatric/Behavioral: Negative for depression, hallucinations, memory loss, substance abuse and suicidal ideas. The patient is nervous/anxious. The patient does not have insomnia.     Blood pressure 114/77, pulse 63, temperature 97.5 F (36.4 C), temperature source Oral, resp. rate 18, height _0  (1.549 m), weight 64.9 kg (143 lb), SpO2 97 %.Body mass index is 27.02 kg/m.  General Appearance: Casual  Eye Contact:  Good  Speech:  Clear and Coherent  Volume:  Normal  Mood:  Anxious  Affect:  Appropriate  Thought Process:  Goal Directed  Orientation:  Full (Time, Place, and Person)  Thought Content:  Logical  Suicidal Thoughts:  No  Homicidal Thoughts:  No  Memory:  Immediate;   Fair Recent;   Fair Remote;   Fair  Judgement:  Fair  Insight:  Fair  Psychomotor Activity:  Decreased  Concentration:  Concentration: Fair  Recall:  [employer] of Knowledge:  Fair  Language:  Fair  Akathisia:  No  Handed:  Right  AIMS (if indicated):     Assets:  Communication Skills Desire for Improvement Financial Resources/Insurance Housing Physical Health Resilience Social Support  ADL's:  Intact  Cognition:  WNL  Sleep:        Treatment Plan Summary: Plan Spoke  with patient at some length and feel satisfied that she is not currently psychotic. She has some chronic mood lability but she has good insight into the fact that a lot of this is her own  problem and that it is something she can control. There is no evidence I see of her being acutely dangerous or meeting commitment criteria. Patient would like to go home and follow up with [facility]. She is not interested in trying to change any of her psychiatric medicine. Supportive counseling completed. Case reviewed with the ER physician and TTS. Discontinue IVC. No new prescriptions. Patient is to follow-up with Dr. Randel Books and [facility] for outpatient treatment.  Disposition: Patient does not meet criteria for psychiatric inpatient admission. Supportive therapy provided about ongoing stressors.  Alethia Berthold, MD 11/18/2016 1:56 PM

## 2016-11-18 NOTE — ED Notes (Signed)
Patient in with doctor. Per patient will be discharged.

## 2017-01-21 ENCOUNTER — Ambulatory Visit (INDEPENDENT_AMBULATORY_CARE_PROVIDER_SITE_OTHER): Payer: Medicare Other | Admitting: Gastroenterology

## 2017-01-21 ENCOUNTER — Encounter (INDEPENDENT_AMBULATORY_CARE_PROVIDER_SITE_OTHER): Payer: Self-pay

## 2017-01-21 ENCOUNTER — Ambulatory Visit: Payer: Medicare Other | Admitting: Gastroenterology

## 2017-01-21 ENCOUNTER — Encounter: Payer: Self-pay | Admitting: Gastroenterology

## 2017-01-21 VITALS — BP 118/77 | HR 78 | Temp 97.8°F | Ht 61.0 in | Wt 145.6 lb

## 2017-01-21 DIAGNOSIS — Z86010 Personal history of colonic polyps: Secondary | ICD-10-CM | POA: Diagnosis not present

## 2017-01-21 DIAGNOSIS — R197 Diarrhea, unspecified: Secondary | ICD-10-CM

## 2017-01-21 NOTE — Addendum Note (Signed)
Addended by: Peggye Ley on: 01/21/2017 02:21 PM   Modules accepted: Orders, SmartSet

## 2017-01-21 NOTE — Progress Notes (Signed)
Jonathon Bellows MD, MRCP(U.K) [address]  [place]  [place], [place] [address]  Main: [phone]  Fax: [phone]   Gastroenterology Consultation  Referring Provider:     Casilda Carls, MD Primary Care Physician:  Casilda Carls, MD Primary Gastroenterologist:  Dr. Jonathon Bellows  Reason for Consultation:     Diarrhea         HPI:   [name] is a 71 y.o. y/o female referred for consultation & management  by Dr. Casilda Carls, MD.   She has been referred for diarrhea. 11/2016- CMP was normal . She says that she had some diarrhea , which began a few months back , on and off. Had her metrformin stopped and since has no diarrhea. Denies any weight loss. She has had colon polyps and has to have one every 5 years, she says she is due for one now. Not on any blood thinners. No weight loss, no rectal bleeding .   Past Medical History:  Diagnosis Date  . Arthritis   . Cancer [facility]) 2004   right breast ca  . Diabetes mellitus without complication ([place])   . Hypercholesterolemia   . Hypertension   . Hypothyroid   . Seasonal allergies     Past Surgical History:  Procedure Laterality Date  . ABDOMINAL HYSTERECTOMY    . BREAST BIOPSY Left    neg  . BREAST EXCISIONAL BIOPSY Right 2004   breast ca radation and lumpectomy  . BREAST LUMPECTOMY Right 2004  . CHOLECYSTECTOMY    . KIDNEY STONE SURGERY      Prior to Admission medications   Medication Sig Start Date End Date Taking? Authorizing Provider  aspirin 81 MG EC tablet Take 1 tablet (81 mg total) by mouth daily. Swallow whole. 01/18/16   Earleen Newport, MD  clonazePAM (KLONOPIN) 1 MG tablet Take 1 tablet (1 mg total) by mouth at bedtime. 10/11/16   Clapacs, Madie Reno, MD  enalapril (VASOTEC) 20 MG tablet Take 1 tablet (20 mg total) by mouth 2 (two) times daily. 09/23/14   Pucilowska, Herma Ard B, MD  glipiZIDE (GLUCOTROL XL) 5 MG 24 hr tablet  01/14/17   [provider]  levothyroxine (SYNTHROID, LEVOTHROID) 75  MCG tablet Take 1 tablet (75 mcg total) by mouth every morning. 09/23/14   Pucilowska, Herma Ard B, MD  metFORMIN (GLUCOPHAGE) 1000 MG tablet Take 1 tablet (1,000 mg total) by mouth 2 (two) times daily. 09/23/14   Pucilowska, Herma Ard B, MD  Olopatadine HCl 0.2 % SOLN  01/06/17   [provider]  simvastatin (ZOCOR) 20 MG tablet Take 1 tablet (20 mg total) by mouth daily. 09/23/14   Pucilowska, Herma Ard B, MD  traZODone (DESYREL) 100 MG tablet Take 1 tablet (100 mg total) by mouth at bedtime. 10/11/16   Clapacs, Madie Reno, MD    No family history on file.   Social History  Substance Use Topics  . Smoking status: Former Smoker    Types: Cigarettes  . Smokeless tobacco: Never Used  . Alcohol use No    Allergies as of 01/21/2017 - Review Complete 11/18/2016  Allergen Reaction Noted  . Navane [thiothixene] Other (See Comments) 09/18/2014  . Penicillins Rash 09/18/2014    Review of Systems:    All systems reviewed and negative except where noted in HPI.   Physical Exam:  There were no vitals taken for this visit. No LMP recorded. Patient has had a hysterectomy. Psych:  Alert and cooperative. Normal mood and affect. General:  Alert,  Well-developed, well-nourished, pleasant and cooperative in NAD Head:  Normocephalic and atraumatic. Eyes:  Sclera clear, no icterus.   Conjunctiva pink. Ears:  Normal auditory acuity. Nose:  No deformity, discharge, or lesions. Mouth:  No deformity or lesions,oropharynx pink & moist. Neck:  Supple; no masses or thyromegaly. Lungs:  Respirations even and unlabored.  Clear throughout to auscultation.   No wheezes, crackles, or rhonchi. No acute distress. Heart:  Regular rate and rhythm; no murmurs, clicks, rubs, or gallops. Abdomen:  Normal bowel sounds.  No bruits.  Soft, non-tender and non-distended without masses, hepatosplenomegaly or hernias noted.  No guarding or rebound tenderness.    Neurologic:  Alert and oriented x3;  grossly normal  neurologically. Skin:  Intact without significant lesions or rashes. No jaundice. Lymph Nodes:  No significant cervical adenopathy. Psych:  Alert and cooperative. Normal mood and affect.  Imaging Studies: No results found.  Assessment and Plan:   [name] is a 71 y.o. y/o female has been referred for diarrhea . H/o of colon polyps   1. Surveillance colonoscopy for colon polyps 2. Diarrhea - resolved likely secondary to Metformin    I have discussed alternative options, risks & benefits,  which include, but are not limited to, bleeding, infection, perforation,respiratory complication & drug reaction.  The patient agrees with this plan & written consent will be obtained.     Follow up as needed  Dr Jonathon Bellows MD,MRCP(U.K)

## 2017-02-13 ENCOUNTER — Ambulatory Visit: Payer: Medicare Other | Admitting: Anesthesiology

## 2017-02-13 ENCOUNTER — Encounter
Admission: RE | Disposition: A | Discharge status: Home / Self Care | Payer: Self-pay | Source: Ambulatory Visit | Attending: Gastroenterology

## 2017-02-13 ENCOUNTER — Ambulatory Visit
Admission: RE | Admit: 2017-02-13 | Discharge: 2017-02-13 | Disposition: A | Discharge status: Home / Self Care | Payer: Medicare Other | Source: Ambulatory Visit | Attending: Gastroenterology | Admitting: Gastroenterology

## 2017-02-13 DIAGNOSIS — K5730 Diverticulosis of large intestine without perforation or abscess without bleeding: Secondary | ICD-10-CM

## 2017-02-13 DIAGNOSIS — Z888 Allergy status to other drugs, medicaments and biological substances status: Secondary | ICD-10-CM | POA: Insufficient documentation

## 2017-02-13 DIAGNOSIS — Z87891 Personal history of nicotine dependence: Secondary | ICD-10-CM | POA: Diagnosis not present

## 2017-02-13 DIAGNOSIS — Z1211 Encounter for screening for malignant neoplasm of colon: Secondary | ICD-10-CM | POA: Insufficient documentation

## 2017-02-13 DIAGNOSIS — Z923 Personal history of irradiation: Secondary | ICD-10-CM | POA: Insufficient documentation

## 2017-02-13 DIAGNOSIS — E7800 Pure hypercholesterolemia, unspecified: Secondary | ICD-10-CM | POA: Diagnosis not present

## 2017-02-13 DIAGNOSIS — Z90710 Acquired absence of both cervix and uterus: Secondary | ICD-10-CM | POA: Insufficient documentation

## 2017-02-13 DIAGNOSIS — Z880 Allergy status to penicillin: Secondary | ICD-10-CM | POA: Insufficient documentation

## 2017-02-13 DIAGNOSIS — Z86010 Personal history of colonic polyps: Secondary | ICD-10-CM | POA: Diagnosis not present

## 2017-02-13 DIAGNOSIS — K640 First degree hemorrhoids: Secondary | ICD-10-CM | POA: Diagnosis not present

## 2017-02-13 DIAGNOSIS — D125 Benign neoplasm of sigmoid colon: Secondary | ICD-10-CM | POA: Insufficient documentation

## 2017-02-13 DIAGNOSIS — E039 Hypothyroidism, unspecified: Secondary | ICD-10-CM | POA: Insufficient documentation

## 2017-02-13 DIAGNOSIS — I10 Essential (primary) hypertension: Secondary | ICD-10-CM | POA: Diagnosis not present

## 2017-02-13 DIAGNOSIS — Z79899 Other long term (current) drug therapy: Secondary | ICD-10-CM | POA: Diagnosis not present

## 2017-02-13 DIAGNOSIS — M1990 Unspecified osteoarthritis, unspecified site: Secondary | ICD-10-CM | POA: Diagnosis not present

## 2017-02-13 DIAGNOSIS — Z7982 Long term (current) use of aspirin: Secondary | ICD-10-CM | POA: Insufficient documentation

## 2017-02-13 DIAGNOSIS — E119 Type 2 diabetes mellitus without complications: Secondary | ICD-10-CM | POA: Diagnosis not present

## 2017-02-13 DIAGNOSIS — Z853 Personal history of malignant neoplasm of breast: Secondary | ICD-10-CM | POA: Insufficient documentation

## 2017-02-13 DIAGNOSIS — Z7984 Long term (current) use of oral hypoglycemic drugs: Secondary | ICD-10-CM | POA: Insufficient documentation

## 2017-02-13 HISTORY — PX: COLONOSCOPY WITH PROPOFOL: SHX5780

## 2017-02-13 LAB — GLUCOSE, CAPILLARY: GLUCOSE-CAPILLARY: 120 mg/dL — AB (ref 65–99)

## 2017-02-13 SURGERY — COLONOSCOPY WITH PROPOFOL
Anesthesia: General

## 2017-02-13 MED ORDER — FENTANYL CITRATE (PF) 100 MCG/2ML IJ SOLN
INTRAMUSCULAR | Status: DC | PRN
  Administered 2017-02-13: 50 ug via INTRAVENOUS

## 2017-02-13 MED ORDER — FENTANYL CITRATE (PF) 100 MCG/2ML IJ SOLN
INTRAMUSCULAR | Status: AC
  Filled 2017-02-13: qty 2

## 2017-02-13 MED ORDER — PROPOFOL 10 MG/ML IV BOLUS
INTRAVENOUS | Status: AC
  Filled 2017-02-13: qty 20

## 2017-02-13 MED ORDER — MIDAZOLAM HCL 5 MG/5ML IJ SOLN
INTRAMUSCULAR | Status: DC | PRN
  Administered 2017-02-13: 1 mg via INTRAVENOUS

## 2017-02-13 MED ORDER — PROPOFOL 500 MG/50ML IV EMUL
INTRAVENOUS | Status: DC | PRN
  Administered 2017-02-13: 140 ug/kg/min via INTRAVENOUS

## 2017-02-13 MED ORDER — PHENYLEPHRINE HCL 10 MG/ML IJ SOLN
INTRAMUSCULAR | Status: DC | PRN
  Administered 2017-02-13 (×2): 100 ug via INTRAVENOUS

## 2017-02-13 MED ORDER — PROPOFOL 10 MG/ML IV BOLUS
INTRAVENOUS | Status: DC | PRN
  Administered 2017-02-13: 80 mg via INTRAVENOUS

## 2017-02-13 MED ORDER — LIDOCAINE HCL (PF) 2 % IJ SOLN
INTRAMUSCULAR | Status: AC
  Filled 2017-02-13: qty 2

## 2017-02-13 MED ORDER — EPHEDRINE SULFATE 50 MG/ML IJ SOLN
INTRAMUSCULAR | Status: DC | PRN
Start: 2017-02-13 — End: 2017-02-13
  Administered 2017-02-13: 5 mg via INTRAVENOUS

## 2017-02-13 MED ORDER — GLYCOPYRROLATE 0.2 MG/ML IJ SOLN
INTRAMUSCULAR | Status: AC
  Filled 2017-02-13: qty 1

## 2017-02-13 MED ORDER — SODIUM CHLORIDE 0.9 % IJ SOLN
INTRAMUSCULAR | Status: AC
  Filled 2017-02-13: qty 10

## 2017-02-13 MED ORDER — MIDAZOLAM HCL 2 MG/2ML IJ SOLN
INTRAMUSCULAR | Status: AC
  Filled 2017-02-13: qty 2

## 2017-02-13 MED ORDER — LIDOCAINE HCL (PF) 2 % IJ SOLN
INTRAMUSCULAR | Status: AC
  Filled 2017-02-13: qty 4

## 2017-02-13 MED ORDER — PROPOFOL 500 MG/50ML IV EMUL
INTRAVENOUS | Status: AC
  Filled 2017-02-13: qty 50

## 2017-02-13 MED ORDER — SODIUM CHLORIDE 0.9 % IV SOLN
INTRAVENOUS | Status: DC
  Administered 2017-02-13 (×2): via INTRAVENOUS

## 2017-02-13 MED ORDER — LIDOCAINE 2% (20 MG/ML) 5 ML SYRINGE
INTRAMUSCULAR | Status: DC | PRN
  Administered 2017-02-13: 30 mg via INTRAVENOUS

## 2017-02-13 NOTE — Op Note (Signed)
[facility] Gastroenterology Patient Name: [name] Procedure Date: 02/13/2017 7:43 AM MRN: [id number] Account #: [id number] Date of Birth: [birth year] Admit Type: Outpatient Age: 71 Room: [facility] ENDO ROOM 4 Gender: Female Note Status: Finalized Procedure:            Colonoscopy Indications:          High risk colon cancer surveillance: Personal history                        of colonic polyps Providers:            Jonathon Bellows MD, MD Referring MD:         Casilda Carls, MD (Referring MD) Medicines:            Monitored Anesthesia Care Complications:        No immediate complications. Procedure:            Pre-Anesthesia Assessment:                       - Prior to the procedure, a History and Physical was                        performed, and patient medications, allergies and                        sensitivities were reviewed. The patient's tolerance of                        previous anesthesia was reviewed.                       - The risks and benefits of the procedure and the                        sedation options and risks were discussed with the                        patient. All questions were answered and informed                        consent was obtained.                       - ASA Grade Assessment: III - A patient with severe                        systemic disease.                       After obtaining informed consent, the colonoscope was                        passed under direct vision. Throughout the procedure,                        the patient's blood pressure, pulse, and oxygen                        saturations were monitored continuously. The                        Colonoscope  was introduced through the anus and                        advanced to the the cecum, identified by the                        appendiceal orifice, IC valve and transillumination.                        The colonoscopy was performed with ease. The patient                tolerated the procedure well. The quality of the bowel                        preparation was good. Findings:      The perianal and digital rectal examinations were normal.      A few small-mouthed diverticula were found in the sigmoid colon.      Non-bleeding internal hemorrhoids were found during retroflexion. The       hemorrhoids were small and Grade I (internal hemorrhoids that do not       prolapse).      A 5 mm polyp was found in the sigmoid colon. The polyp was sessile. The       polyp was removed with a cold snare. Resection and retrieval were       complete.      The exam was otherwise without abnormality on direct and retroflexion       views. Impression:           - Diverticulosis in the sigmoid colon.                       - Non-bleeding internal hemorrhoids.                       - One 5 mm polyp in the sigmoid colon, removed with a                        cold snare. Resected and retrieved.                       - The examination was otherwise normal on direct and                        retroflexion views. Recommendation:       - Discharge patient to home (with escort).                       - Resume previous diet.                       - Continue present medications.                       - Await pathology results.                       - Repeat colonoscopy in 5 years for surveillance. Procedure Code(s):    --- Professional ---                       [id number], Colonoscopy, flexible; with removal of tumor(s),  polyp(s), or other lesion(s) by snare technique Diagnosis Code(s):    --- Professional ---                       Z86.010, Personal history of colonic polyps                       K64.0, First degree hemorrhoids                       D12.5, Benign neoplasm of sigmoid colon                       K57.30, Diverticulosis of large intestine without                        perforation or abscess without bleeding CPT copyright 2016 American  Medical Association. All rights reserved. The codes documented in this report are preliminary and upon coder review may  be revised to meet current compliance requirements. Jonathon Bellows, MD Jonathon Bellows MD, MD 02/13/2017 8:28:15 AM This report has been signed electronically. Number of Addenda: 0 Note Initiated On: 02/13/2017 7:43 AM Scope Withdrawal Time: 0 hours 7 minutes 12 seconds  Total Procedure Duration: 0 hours 11 minutes 28 seconds       [facility]

## 2017-02-13 NOTE — H&P (Signed)
Jonathon Bellows MD [address], [place], [place] [address] Phone: [phone] Fax : [phone]  Primary Care Physician:  Casilda Carls, MD Primary Gastroenterologist:  Dr. Jonathon Bellows   Pre-Procedure History & Physical: HPI:  [name] is a 72 y.o. female is here for an colonoscopy.   Past Medical History:  Diagnosis Date  . Arthritis   . Cancer [facility]) 2004   right breast ca  . Diabetes mellitus without complication ([place])   . Hypercholesterolemia   . Hypertension   . Hypothyroid   . Seasonal allergies     Past Surgical History:  Procedure Laterality Date  . ABDOMINAL HYSTERECTOMY    . BREAST BIOPSY Left    neg  . BREAST EXCISIONAL BIOPSY Right 2004   breast ca radation and lumpectomy  . BREAST LUMPECTOMY Right 2004  . CHOLECYSTECTOMY    . KIDNEY STONE SURGERY      Prior to Admission medications   Medication Sig Start Date End Date Taking? Authorizing Provider  aspirin 81 MG EC tablet Take 1 tablet (81 mg total) by mouth daily. Swallow whole. 01/18/16  Yes Earleen Newport, MD  clonazePAM (KLONOPIN) 1 MG tablet Take 1 tablet (1 mg total) by mouth at bedtime. 10/11/16  Yes Clapacs, Madie Reno, MD  enalapril (VASOTEC) 20 MG tablet Take 1 tablet (20 mg total) by mouth 2 (two) times daily. 09/23/14  Yes Pucilowska, Jolanta B, MD  glipiZIDE (GLUCOTROL XL) 5 MG 24 hr tablet  01/14/17  Yes [provider]  levothyroxine (SYNTHROID, LEVOTHROID) 75 MCG tablet Take 1 tablet (75 mcg total) by mouth every morning. 09/23/14  Yes Pucilowska, Jolanta B, MD  Olopatadine HCl 0.2 % SOLN  01/06/17  Yes [provider]  simvastatin (ZOCOR) 20 MG tablet Take 1 tablet (20 mg total) by mouth daily. 09/23/14  Yes Pucilowska, Jolanta B, MD  traZODone (DESYREL) 100 MG tablet Take 1 tablet (100 mg total) by mouth at bedtime. 10/11/16  Yes Clapacs, Madie Reno, MD  metFORMIN (GLUCOPHAGE) 1000 MG tablet  11/25/16   [provider]    Allergies as of 01/21/2017 - Review  Complete 01/21/2017  Allergen Reaction Noted  . Navane [thiothixene] Other (See Comments) 09/18/2014  . Penicillins Rash 09/18/2014    History reviewed. No pertinent family history.  Social History   Social History  . Marital status: Divorced    Spouse name: N/A  . Number of children: N/A  . Years of education: N/A   Occupational History  . Not on file.   Social History Main Topics  . Smoking status: Former Smoker    Types: Cigarettes  . Smokeless tobacco: Never Used  . Alcohol use No  . Drug use: No  . Sexual activity: Not Currently   Other Topics Concern  . Not on file   Social History Narrative  . No narrative on file    Review of Systems: See HPI, otherwise negative ROS  Physical Exam: BP 123/73   Pulse 90   Temp (!) 97.1 F (36.2 C) (Tympanic)   Resp 18   Ht 5\' 1"  (1.549 m)   Wt 145 lb (65.8 kg)   SpO2 99%   BMI 27.40 kg/m  General:   Alert,  pleasant and cooperative in NAD Head:  Normocephalic and atraumatic. Neck:  Supple; no masses or thyromegaly. Lungs:  Clear throughout to auscultation.    Heart:  Regular rate and rhythm. Abdomen:  Soft, nontender and nondistended. Normal bowel sounds, without guarding, and without  rebound.   Neurologic:  Alert and  oriented x4;  grossly normal neurologically.  Impression/Plan: [name] is here for an colonoscopy to be performed for surveillance due to prior history of colon polyps   Risks, benefits, limitations, and alternatives regarding  colonoscopy have been reviewed with the patient.  Questions have been answered.  All parties agreeable.   Jonathon Bellows, MD  02/13/2017, 8:08 AM

## 2017-02-13 NOTE — Transfer of Care (Signed)
Immediate Anesthesia Transfer of Care Note  Patient: [name]  Procedure(s) Performed: Procedure(s): COLONOSCOPY WITH PROPOFOL (N/A)  Patient Location: PACU and Endoscopy Unit  Anesthesia Type:General  Level of Consciousness: awake and patient cooperative  Airway & Oxygen Therapy: Patient Spontanous Breathing and Patient connected to nasal cannula oxygen  Post-op Assessment: Report given to RN and Post -op Vital signs reviewed and stable  Post vital signs: Reviewed and stable  Last Vitals:  Vitals:   02/13/17 0727  BP: 123/73  Pulse: 90  Resp: 18  Temp: (!) 36.2 C  SpO2: 99%    Last Pain:  Vitals:   02/13/17 0727  TempSrc: Tympanic         Complications: No apparent anesthesia complications

## 2017-02-13 NOTE — Anesthesia Preprocedure Evaluation (Signed)
Anesthesia Evaluation  Patient identified by MRN, date of birth, ID band Patient awake    Reviewed: Allergy & Precautions, H&P , NPO status , Patient's Chart, lab work & pertinent test results, reviewed documented beta blocker date and time   Airway Mallampati: II   Neck ROM: full    Dental  (+) Poor Dentition, Teeth Intact   Pulmonary neg pulmonary ROS, former smoker,    Pulmonary exam normal        Cardiovascular Exercise Tolerance: Poor hypertension, On Medications negative cardio ROS Normal cardiovascular exam Rhythm:regular Rate:Normal     Neuro/Psych PSYCHIATRIC DISORDERS negative neurological ROS  negative psych ROS   GI/Hepatic negative GI ROS, Neg liver ROS,   Endo/Other  negative endocrine ROSdiabetesHypothyroidism   Renal/GU negative Renal ROS  negative genitourinary   Musculoskeletal   Abdominal   Peds  Hematology negative hematology ROS (+)   Anesthesia Other Findings Past Medical History: No date: Arthritis 2004: Cancer ([place])     Comment:  right breast ca No date: Diabetes mellitus without complication ([facility]) No date: Hypercholesterolemia No date: Hypertension No date: Hypothyroid No date: Seasonal allergies Past Surgical History: No date: ABDOMINAL HYSTERECTOMY No date: BREAST BIOPSY; Left     Comment:  neg 2004: BREAST EXCISIONAL BIOPSY; Right     Comment:  breast ca radation and lumpectomy 2004: BREAST LUMPECTOMY; Right No date: CHOLECYSTECTOMY No date: KIDNEY STONE SURGERY BMI    Body Mass Index:  27.40 kg/m     Reproductive/Obstetrics negative OB ROS                             Anesthesia Physical Anesthesia Plan  ASA: III  Anesthesia Plan: General   Post-op Pain Management:    Induction:   PONV Risk Score and Plan:   Airway Management Planned:   Additional Equipment:   Intra-op Plan:   Post-operative Plan:   Informed Consent: I have  reviewed the patients History and Physical, chart, labs and discussed the procedure including the risks, benefits and alternatives for the proposed anesthesia with the patient or authorized representative who has indicated his/her understanding and acceptance.   Dental Advisory Given  Plan Discussed with: CRNA  Anesthesia Plan Comments:         Anesthesia Quick Evaluation

## 2017-02-13 NOTE — Anesthesia Postprocedure Evaluation (Signed)
Anesthesia Post Note  Patient: [name]  Procedure(s) Performed: Procedure(s) (LRB): COLONOSCOPY WITH PROPOFOL (N/A)  Patient location during evaluation: PACU Anesthesia Type: General Level of consciousness: awake and alert Pain management: pain level controlled Vital Signs Assessment: post-procedure vital signs reviewed and stable Respiratory status: spontaneous breathing, nonlabored ventilation, respiratory function stable and patient connected to nasal cannula oxygen Cardiovascular status: blood pressure returned to baseline and stable Postop Assessment: no apparent nausea or vomiting Anesthetic complications: no     Last Vitals:  Vitals:   02/13/17 0853 02/13/17 0903  BP: 96/61 100/60  Pulse: 66 63  Resp: 16 13  Temp:    SpO2: 97% 93%    Last Pain:  Vitals:   02/13/17 0833  TempSrc: Tympanic                 Molli Barrows

## 2017-02-13 NOTE — Anesthesia Post-op Follow-up Note (Signed)
Anesthesia QCDR form completed.        

## 2017-02-14 ENCOUNTER — Encounter: Payer: Self-pay | Admitting: Gastroenterology

## 2017-02-14 LAB — SURGICAL PATHOLOGY

## 2017-02-16 ENCOUNTER — Encounter: Payer: Self-pay | Admitting: Gastroenterology

## 2017-02-24 ENCOUNTER — Ambulatory Visit (INDEPENDENT_AMBULATORY_CARE_PROVIDER_SITE_OTHER): Payer: Medicare Other | Admitting: Urology

## 2017-02-24 ENCOUNTER — Encounter: Payer: Self-pay | Admitting: Urology

## 2017-02-24 VITALS — BP 132/81 | HR 75 | Ht 61.0 in | Wt 150.4 lb

## 2017-02-24 DIAGNOSIS — R350 Frequency of micturition: Secondary | ICD-10-CM | POA: Diagnosis not present

## 2017-02-24 LAB — URINALYSIS, COMPLETE
BILIRUBIN UA: NEGATIVE
GLUCOSE, UA: NEGATIVE
Ketones, UA: NEGATIVE
Leukocytes, UA: NEGATIVE
NITRITE UA: NEGATIVE
Protein, UA: NEGATIVE
Specific Gravity, UA: 1.01 (ref 1.005–1.030)
UUROB: 0.2 mg/dL (ref 0.2–1.0)
pH, UA: 6.5 (ref 5.0–7.5)

## 2017-02-24 LAB — BLADDER SCAN AMB NON-IMAGING: SCAN RESULT: 64

## 2017-02-24 NOTE — Progress Notes (Signed)
02/24/2017 11:10 AM   [name] [birth year] [id number]  Referring provider: Casilda Carls, MD [address] [place], [place] [address]  Chief Complaint  Patient presents with  . Urinary Frequency    HPI: I was consulted to assess the patient's frequency that has recently improved. I think she was going quite frequently but she is having much less urgency and frequency now. She voids every 2 or 3 hours and generally does not get up at night. She only leaked once without awareness. She describes a partial nephrectomy years ago for a possible stone.  She does not get bladder infections. She is on oral hypoglycemics. Clinically she was not infected today  Modifying factors: There are no other modifying factors  Associated signs and symptoms: There are no other associated signs and symptoms Aggravating and relieving factors: There are no other aggravating or relieving factors Severity: Moderate Duration: Persistent     PMH: Past Medical History:  Diagnosis Date  . Arthritis   . Cancer [facility]) 2004   right breast ca  . Diabetes mellitus without complication ([place])   . Hypercholesterolemia   . Hypertension   . Hypothyroid   . Seasonal allergies     Surgical History: Past Surgical History:  Procedure Laterality Date  . ABDOMINAL HYSTERECTOMY    . BREAST BIOPSY Left    neg  . BREAST EXCISIONAL BIOPSY Right 2004   breast ca radation and lumpectomy  . BREAST LUMPECTOMY Right 2004  . CHOLECYSTECTOMY    . COLONOSCOPY WITH PROPOFOL N/A 02/13/2017   Procedure: COLONOSCOPY WITH PROPOFOL;  Surgeon: Jonathon Bellows, MD;  Location: [facility] ENDOSCOPY;  Service: Gastroenterology;  Laterality: N/A;  . KIDNEY STONE SURGERY      Home Medications:  Allergies as of 02/24/2017      Reactions   Navane [thiothixene] Other (See Comments)   Reaction:  Unknown   Penicillins Rash   Has patient had a PCN reaction causing immediate rash, facial/tongue/throat swelling, SOB or lightheadedness with  hypotension: No Has patient had a PCN reaction causing severe rash involving mucus membranes or skin necrosis: No Has patient had a PCN reaction that required hospitalization: No Has patient had a PCN reaction occurring within the last 10 years: No If all of the above answers are "NO", then may proceed with Cephalosporin use.      Medication List       Accurate as of 02/24/17 11:10 AM. Always use your most recent med list.          aspirin 81 MG EC tablet Take 1 tablet (81 mg total) by mouth daily. Swallow whole.   clonazePAM 1 MG tablet Commonly known as:  KLONOPIN Take 1 tablet (1 mg total) by mouth at bedtime.   enalapril 20 MG tablet Commonly known as:  VASOTEC Take 1 tablet (20 mg total) by mouth 2 (two) times daily.   glipiZIDE 5 MG 24 hr tablet Commonly known as:  GLUCOTROL XL   levothyroxine 75 MCG tablet Commonly known as:  SYNTHROID, LEVOTHROID Take 1 tablet (75 mcg total) by mouth every morning.   metFORMIN 1000 MG tablet Commonly known as:  GLUCOPHAGE   Olopatadine HCl 0.2 % Soln   simvastatin 20 MG tablet Commonly known as:  ZOCOR Take 1 tablet (20 mg total) by mouth daily.   traZODone 100 MG tablet Commonly known as:  DESYREL Take 1 tablet (100 mg total) by mouth at bedtime.       Allergies:  Allergies  Allergen Reactions  .  Navane [Thiothixene] Other (See Comments)    Reaction:  Unknown  . Penicillins Rash    Has patient had a PCN reaction causing immediate rash, facial/tongue/throat swelling, SOB or lightheadedness with hypotension: No Has patient had a PCN reaction causing severe rash involving mucus membranes or skin necrosis: No Has patient had a PCN reaction that required hospitalization: No Has patient had a PCN reaction occurring within the last 10 years: No If all of the above answers are "NO", then may proceed with Cephalosporin use.     Family History: Family History  Problem Relation Age of Onset  . Bladder Cancer Neg Hx   .  Kidney cancer Neg Hx     Social History:  reports that she has quit smoking. Her smoking use included Cigarettes. She has never used smokeless tobacco. She reports that she does not drink alcohol or use drugs.  ROS: UROLOGY Frequent Urination?: Yes Hard to postpone urination?: Yes Burning/pain with urination?: No Get up at night to urinate?: Yes Leakage of urine?: No Urine stream starts and stops?: No Trouble starting stream?: No Do you have to strain to urinate?: No Blood in urine?: No Urinary tract infection?: Yes Sexually transmitted disease?: Yes Injury to kidneys or bladder?: No Painful intercourse?: No Weak stream?: No Currently pregnant?: No Vaginal bleeding?: No Last menstrual period?: n  Gastrointestinal Nausea?: No Vomiting?: No Indigestion/heartburn?: No Diarrhea?: Yes Constipation?: No  Constitutional Fever: No Night sweats?: No Weight loss?: No Fatigue?: No  Skin Skin rash/lesions?: No Itching?: Yes  Eyes Blurred vision?: No Double vision?: No  Ears/Nose/Throat Sore throat?: No Sinus problems?: No  Hematologic/Lymphatic Swollen glands?: No Easy bruising?: No  Cardiovascular Leg swelling?: No Chest pain?: No  Respiratory Cough?: Yes Shortness of breath?: No  Endocrine Excessive thirst?: No  Musculoskeletal Back pain?: No Joint pain?: No  Neurological Headaches?: No Dizziness?: No  Psychologic Depression?: No Anxiety?: No  Physical Exam: BP 132/81 (BP Location: Right Arm, Patient Position: Sitting, Cuff Size: Normal)   Pulse 75   Ht 5\' 1"  (1.549 m)   Wt 68.2 kg (150 lb 6.4 oz)   BMI 28.42 kg/m   Constitutional:  Alert and oriented, No acute distress. HEENT: [place] AT, moist mucus membranes.  Trachea midline, no masses. Cardiovascular: No clubbing, cyanosis, or edema. Respiratory: Normal respiratory effort, no increased work of breathing. GI: Abdomen is soft, nontender, nondistended, no abdominal masses GU: No CVA  tenderness. High grade 2 cystocele with moderate atrophy and no stress incontinence Skin: No rashes, bruises or suspicious lesions. Lymph: No cervical or inguinal adenopathy. Neurologic: Grossly intact, no focal deficits, moving all 4 extremities. Psychiatric: Normal mood and affect.  Laboratory Data: Lab Results  Component Value Date   WBC 8.7 11/17/2016   HGB 13.4 11/17/2016   HCT 38.9 11/17/2016   MCV 86.4 11/17/2016   PLT 286 11/17/2016    Lab Results  Component Value Date   CREATININE 1.14 (H) 11/17/2016    No results found for: PSA  No results found for: TESTOSTERONE  Lab Results  Component Value Date   HGBA1C 6.7 (H) 03/26/2012    Urinalysis    Component Value Date/Time   COLORURINE STRAW (A) 11/17/2016 1511   APPEARANCEUR CLEAR (A) 11/17/2016 1511   APPEARANCEUR Cloudy 02/25/2014 0927   LABSPEC 1.009 11/17/2016 1511   LABSPEC 1.018 02/25/2014 0927   PHURINE 5.0 11/17/2016 1511   GLUCOSEU NEGATIVE 11/17/2016 1511   GLUCOSEU Negative 02/25/2014 0927   HGBUR NEGATIVE 11/17/2016 1511   BILIRUBINUR NEGATIVE  11/17/2016 1511   BILIRUBINUR Negative 02/25/2014 [place] 11/17/2016 1511   PROTEINUR NEGATIVE 11/17/2016 1511   NITRITE NEGATIVE 11/17/2016 1511   LEUKOCYTESUR NEGATIVE 11/17/2016 1511   LEUKOCYTESUR 3+ 02/25/2014 1497    Pertinent Imaging: none  Assessment & Plan:  The patient has frequency and urgency that has improved. A CT scan in November 2007 demonstrated bilateral small stones 2-4 mm in size and one 7 mm stone in the right kidney. Recent serum creatinine was 1.14 but she had noted a history of renal insufficiency. Reassurance given. No blood in the urine. See when necessary. Return to clinic likely for overactive bladder medication is symptoms worsen  1. Urinary frequency 2. Nephrolithiasis - Urinalysis, Complete - Bladder Scan (Post Void Residual) in office   No Follow-up on file.  Reece Packer, MD  [facility] [address], [place], [place] [address] [phone]

## 2017-02-25 ENCOUNTER — Telehealth: Payer: Self-pay | Admitting: Urology

## 2017-02-25 NOTE — Telephone Encounter (Signed)
Pt called stating she saw Dr. Matilde Sprang yesterday and feels she forgot to tell Dr. Matilde Sprang about her accidentally drinking anti freeze over 11 yrs ago.  Pt states she wants to make sure Dr. Matilde Sprang knows this as it may change his diagnosis and treatment.  Pt is very very adament that Dr. Matilde Sprang knows about this ASAP. Pt is very anxious and worried to know if this changes anything.  Please advise. Thanks.

## 2017-06-10 ENCOUNTER — Other Ambulatory Visit: Payer: Self-pay | Admitting: Internal Medicine

## 2017-06-10 DIAGNOSIS — Z1231 Encounter for screening mammogram for malignant neoplasm of breast: Secondary | ICD-10-CM

## 2017-07-07 ENCOUNTER — Other Ambulatory Visit: Payer: Self-pay

## 2017-07-07 ENCOUNTER — Emergency Department
Admission: EM | Admit: 2017-07-07 | Discharge: 2017-07-07 | Disposition: A | Discharge status: Other Acute Inpatient Hospital | Payer: Medicare Other | Attending: Emergency Medicine | Admitting: Emergency Medicine

## 2017-07-07 ENCOUNTER — Encounter: Payer: Self-pay | Admitting: Emergency Medicine

## 2017-07-07 ENCOUNTER — Inpatient Hospital Stay
Admission: AD | Admit: 2017-07-07 | Discharge: 2017-07-10 | DRG: 885 | Disposition: A | Discharge status: Home / Self Care | Payer: Medicare Other | Source: Intra-hospital | Attending: Psychiatry | Admitting: Psychiatry

## 2017-07-07 DIAGNOSIS — Z7984 Long term (current) use of oral hypoglycemic drugs: Secondary | ICD-10-CM

## 2017-07-07 DIAGNOSIS — Z7982 Long term (current) use of aspirin: Secondary | ICD-10-CM | POA: Diagnosis not present

## 2017-07-07 DIAGNOSIS — Z923 Personal history of irradiation: Secondary | ICD-10-CM

## 2017-07-07 DIAGNOSIS — Z79890 Hormone replacement therapy (postmenopausal): Secondary | ICD-10-CM

## 2017-07-07 DIAGNOSIS — E7800 Pure hypercholesterolemia, unspecified: Secondary | ICD-10-CM | POA: Diagnosis present

## 2017-07-07 DIAGNOSIS — Z888 Allergy status to other drugs, medicaments and biological substances status: Secondary | ICD-10-CM

## 2017-07-07 DIAGNOSIS — F410 Panic disorder [episodic paroxysmal anxiety] without agoraphobia: Secondary | ICD-10-CM | POA: Diagnosis present

## 2017-07-07 DIAGNOSIS — Z90710 Acquired absence of both cervix and uterus: Secondary | ICD-10-CM

## 2017-07-07 DIAGNOSIS — E785 Hyperlipidemia, unspecified: Secondary | ICD-10-CM | POA: Diagnosis present

## 2017-07-07 DIAGNOSIS — Z853 Personal history of malignant neoplasm of breast: Secondary | ICD-10-CM | POA: Diagnosis not present

## 2017-07-07 DIAGNOSIS — Z638 Other specified problems related to primary support group: Secondary | ICD-10-CM

## 2017-07-07 DIAGNOSIS — F332 Major depressive disorder, recurrent severe without psychotic features: Secondary | ICD-10-CM | POA: Insufficient documentation

## 2017-07-07 DIAGNOSIS — Z87891 Personal history of nicotine dependence: Secondary | ICD-10-CM | POA: Diagnosis not present

## 2017-07-07 DIAGNOSIS — E1169 Type 2 diabetes mellitus with other specified complication: Secondary | ICD-10-CM

## 2017-07-07 DIAGNOSIS — E119 Type 2 diabetes mellitus without complications: Secondary | ICD-10-CM | POA: Insufficient documentation

## 2017-07-07 DIAGNOSIS — Z880 Allergy status to penicillin: Secondary | ICD-10-CM

## 2017-07-07 DIAGNOSIS — Z9049 Acquired absence of other specified parts of digestive tract: Secondary | ICD-10-CM

## 2017-07-07 DIAGNOSIS — F4325 Adjustment disorder with mixed disturbance of emotions and conduct: Secondary | ICD-10-CM | POA: Diagnosis present

## 2017-07-07 DIAGNOSIS — I10 Essential (primary) hypertension: Secondary | ICD-10-CM | POA: Diagnosis present

## 2017-07-07 DIAGNOSIS — Z79899 Other long term (current) drug therapy: Secondary | ICD-10-CM

## 2017-07-07 DIAGNOSIS — F3130 Bipolar disorder, current episode depressed, mild or moderate severity, unspecified: Secondary | ICD-10-CM | POA: Diagnosis present

## 2017-07-07 DIAGNOSIS — F319 Bipolar disorder, unspecified: Secondary | ICD-10-CM | POA: Diagnosis present

## 2017-07-07 DIAGNOSIS — F4310 Post-traumatic stress disorder, unspecified: Secondary | ICD-10-CM | POA: Diagnosis present

## 2017-07-07 DIAGNOSIS — J301 Allergic rhinitis due to pollen: Secondary | ICD-10-CM | POA: Diagnosis present

## 2017-07-07 DIAGNOSIS — F314 Bipolar disorder, current episode depressed, severe, without psychotic features: Secondary | ICD-10-CM

## 2017-07-07 DIAGNOSIS — E039 Hypothyroidism, unspecified: Secondary | ICD-10-CM | POA: Diagnosis present

## 2017-07-07 DIAGNOSIS — Z915 Personal history of self-harm: Secondary | ICD-10-CM

## 2017-07-07 LAB — URINE DRUG SCREEN, QUALITATIVE (ARMC ONLY)
Amphetamines, Ur Screen: NOT DETECTED
BARBITURATES, UR SCREEN: NOT DETECTED
Benzodiazepine, Ur Scrn: NOT DETECTED
COCAINE METABOLITE, UR ~~LOC~~: NOT DETECTED
Cannabinoid 50 Ng, Ur ~~LOC~~: NOT DETECTED
MDMA (Ecstasy)Ur Screen: NOT DETECTED
Methadone Scn, Ur: NOT DETECTED
OPIATE, UR SCREEN: NOT DETECTED
PHENCYCLIDINE (PCP) UR S: NOT DETECTED
TRICYCLIC, UR SCREEN: NOT DETECTED

## 2017-07-07 LAB — COMPREHENSIVE METABOLIC PANEL
ALK PHOS: 53 U/L (ref 38–126)
ALT: 23 U/L (ref 14–54)
AST: 22 U/L (ref 15–41)
Albumin: 4.3 g/dL (ref 3.5–5.0)
Anion gap: 9 (ref 5–15)
BILIRUBIN TOTAL: 0.7 mg/dL (ref 0.3–1.2)
BUN: 27 mg/dL — ABNORMAL HIGH (ref 6–20)
CO2: 24 mmol/L (ref 22–32)
Calcium: 9.7 mg/dL (ref 8.9–10.3)
Chloride: 106 mmol/L (ref 101–111)
Creatinine, Ser: 1.38 mg/dL — ABNORMAL HIGH (ref 0.44–1.00)
GFR calc Af Amer: 43 mL/min — ABNORMAL LOW (ref >60)
GFR calc non Af Amer: 37 mL/min — ABNORMAL LOW (ref >60)
Glucose, Bld: 147 mg/dL — ABNORMAL HIGH (ref 65–99)
POTASSIUM: 4.3 mmol/L (ref 3.5–5.1)
Sodium: 139 mmol/L (ref 135–145)
TOTAL PROTEIN: 7.8 g/dL (ref 6.5–8.1)

## 2017-07-07 LAB — CBC
HCT: 42.9 % (ref 35.0–47.0)
HEMOGLOBIN: 14.3 g/dL (ref 12.0–16.0)
MCH: 29.6 pg (ref 26.0–34.0)
MCHC: 33.3 g/dL (ref 32.0–36.0)
MCV: 88.7 fL (ref 80.0–100.0)
Platelets: 245 10*3/uL (ref 150–440)
RBC: 4.84 MIL/uL (ref 3.80–5.20)
RDW: 13.4 % (ref 11.5–14.5)
WBC: 7.8 10*3/uL (ref 3.6–11.0)

## 2017-07-07 LAB — ETHANOL: Alcohol, Ethyl (B): <10 mg/dL (ref <10)

## 2017-07-07 MED ORDER — GLIPIZIDE ER 5 MG PO TB24
5.0000 mg | ORAL_TABLET | Freq: Two times a day (BID) | ORAL | Status: DC
  Filled 2017-07-07: qty 1

## 2017-07-07 MED ORDER — LEVOTHYROXINE SODIUM 75 MCG PO TABS
75.0000 ug | ORAL_TABLET | Freq: Every day | ORAL | Status: DC
  Administered 2017-07-08: 75 ug via ORAL
  Filled 2017-07-07: qty 1

## 2017-07-07 MED ORDER — SIMVASTATIN 40 MG PO TABS
20.0000 mg | ORAL_TABLET | Freq: Every day | ORAL | Status: DC
  Administered 2017-07-07 – 2017-07-09 (×3): 20 mg via ORAL
  Filled 2017-07-07 (×3): qty 1

## 2017-07-07 MED ORDER — CLONAZEPAM 1 MG PO TABS: 1.0000 mg | ORAL_TABLET | Freq: Every day | ORAL | Status: DC

## 2017-07-07 MED ORDER — FLUOXETINE HCL 20 MG PO CAPS
20.0000 mg | ORAL_CAPSULE | Freq: Every day | ORAL | Status: DC
  Administered 2017-07-07: 20 mg via ORAL
  Filled 2017-07-07 (×2): qty 1

## 2017-07-07 MED ORDER — ACETAMINOPHEN 325 MG PO TABS: 650.0000 mg | ORAL_TABLET | Freq: Four times a day (QID) | ORAL | Status: DC | PRN

## 2017-07-07 MED ORDER — LEVOTHYROXINE SODIUM 75 MCG PO TABS: 75.0000 ug | ORAL_TABLET | Freq: Every day | ORAL | Status: DC

## 2017-07-07 MED ORDER — LORATADINE 10 MG PO TABS
10.0000 mg | ORAL_TABLET | Freq: Every day | ORAL | Status: DC
  Administered 2017-07-07: 10 mg via ORAL
  Filled 2017-07-07: qty 1

## 2017-07-07 MED ORDER — ASPIRIN EC 81 MG PO TBEC
81.0000 mg | DELAYED_RELEASE_TABLET | Freq: Every day | ORAL | Status: DC
  Administered 2017-07-08 – 2017-07-10 (×3): 81 mg via ORAL
  Filled 2017-07-07 (×3): qty 1

## 2017-07-07 MED ORDER — TRAZODONE HCL 100 MG PO TABS
100.0000 mg | ORAL_TABLET | Freq: Every day | ORAL | Status: DC
  Administered 2017-07-07 – 2017-07-09 (×3): 100 mg via ORAL
  Filled 2017-07-07 (×4): qty 1

## 2017-07-07 MED ORDER — MAGNESIUM HYDROXIDE 400 MG/5ML PO SUSP: 30.0000 mL | Freq: Every day | ORAL | Status: DC | PRN

## 2017-07-07 MED ORDER — ALUM & MAG HYDROXIDE-SIMETH 200-200-20 MG/5ML PO SUSP: 30.0000 mL | ORAL | Status: DC | PRN

## 2017-07-07 MED ORDER — TRAZODONE HCL 100 MG PO TABS: 100.0000 mg | ORAL_TABLET | Freq: Every day | ORAL | Status: DC

## 2017-07-07 MED ORDER — LORATADINE 10 MG PO TABS
10.0000 mg | ORAL_TABLET | Freq: Every day | ORAL | Status: DC
  Administered 2017-07-08 – 2017-07-10 (×3): 10 mg via ORAL
  Filled 2017-07-07 (×3): qty 1

## 2017-07-07 MED ORDER — ENALAPRIL MALEATE 20 MG PO TABS
20.0000 mg | ORAL_TABLET | Freq: Two times a day (BID) | ORAL | Status: DC
  Administered 2017-07-07: 20 mg via ORAL
  Filled 2017-07-07 (×3): qty 1

## 2017-07-07 MED ORDER — FLUOXETINE HCL 20 MG PO CAPS
20.0000 mg | ORAL_CAPSULE | Freq: Every day | ORAL | Status: DC
  Administered 2017-07-08 – 2017-07-10 (×3): 20 mg via ORAL
  Filled 2017-07-07 (×3): qty 1

## 2017-07-07 MED ORDER — ASPIRIN EC 81 MG PO TBEC
81.0000 mg | DELAYED_RELEASE_TABLET | Freq: Every day | ORAL | Status: DC
  Filled 2017-07-07: qty 1

## 2017-07-07 MED ORDER — CLONAZEPAM 1 MG PO TABS
1.0000 mg | ORAL_TABLET | Freq: Every day | ORAL | Status: DC
  Administered 2017-07-07: 1 mg via ORAL
  Filled 2017-07-07: qty 1

## 2017-07-07 MED ORDER — ENALAPRIL MALEATE 10 MG PO TABS
20.0000 mg | ORAL_TABLET | Freq: Two times a day (BID) | ORAL | Status: DC
  Administered 2017-07-07 – 2017-07-10 (×6): 20 mg via ORAL
  Filled 2017-07-07 (×5): qty 2
  Filled 2017-07-07: qty 1
  Filled 2017-07-07: qty 2

## 2017-07-07 MED ORDER — GLIPIZIDE ER 5 MG PO TB24
5.0000 mg | ORAL_TABLET | Freq: Two times a day (BID) | ORAL | Status: DC
  Administered 2017-07-07 – 2017-07-10 (×7): 5 mg via ORAL
  Filled 2017-07-07 (×7): qty 1

## 2017-07-07 MED ORDER — SIMVASTATIN 40 MG PO TABS: 20.0000 mg | ORAL_TABLET | Freq: Every day | ORAL | Status: DC

## 2017-07-07 NOTE — BH Assessment (Signed)
Assessment Note  [name] is an 72 y.o. female who presents to the ER via law enforcement after she called 911, fearing someone was trying to harm her. Per the report of the patient, she came to the ER to get help with her mental health. During the interview, the patient was able to answer questions. However, there were times when she talked about things that weren't related to the question. Patient was also liable during the interview. She would cycle between anger, crying/sad to laughter/happy.  Throughout the interview, she denied SI/HI and AV/H. She was able to acknowledge she needed help and that's why she came to the ER. She currently receives outpatient treatment with [facility]. She's with their Conservation officer, nature (CST). She states, she recently became more engage in treatment and believes that's why she's experiencing the emotional changes.   Diagnosis: Bipolar  Past Medical History:  Past Medical History:  Diagnosis Date  . Arthritis   . Cancer [facility]) 2004   right breast ca  . Diabetes mellitus without complication ([place])   . Hypercholesterolemia   . Hypertension   . Hypothyroid   . Seasonal allergies     Past Surgical History:  Procedure Laterality Date  . ABDOMINAL HYSTERECTOMY    . BREAST BIOPSY Left    neg  . BREAST EXCISIONAL BIOPSY Right 2004   breast ca radation and lumpectomy  . BREAST LUMPECTOMY Right 2004  . CHOLECYSTECTOMY    . COLONOSCOPY WITH PROPOFOL N/A 02/13/2017   Procedure: COLONOSCOPY WITH PROPOFOL;  Surgeon: Jonathon Bellows, MD;  Location: [facility] ENDOSCOPY;  Service: Gastroenterology;  Laterality: N/A;  . KIDNEY STONE SURGERY      Family History:  Family History  Problem Relation Age of Onset  . Bladder Cancer Neg Hx   . Kidney cancer Neg Hx     Social History:  reports that she has quit smoking. Her smoking use included cigarettes. she has never used smokeless tobacco. She reports that she does not drink alcohol or use drugs.  Additional Social  History:  Alcohol / Drug Use Pain Medications: See PTA Prescriptions: See PTA Over the Counter: See PTA History of alcohol / drug use?: No history of alcohol / drug abuse Longest period of sobriety (when/how long): n/a Negative Consequences of Use: (n/a) Withdrawal Symptoms: (n/a)  CIWA: CIWA-Ar BP: (!) 164/107 Pulse Rate: 72 COWS:    Allergies:  Allergies  Allergen Reactions  . Navane [Thiothixene] Other (See Comments)    Reaction:  Unknown  . Penicillins Rash    Has patient had a PCN reaction causing immediate rash, facial/tongue/throat swelling, SOB or lightheadedness with hypotension: No Has patient had a PCN reaction causing severe rash involving mucus membranes or skin necrosis: No Has patient had a PCN reaction that required hospitalization: No Has patient had a PCN reaction occurring within the last 10 years: No If all of the above answers are "NO", then may proceed with Cephalosporin use.     Home Medications:  (Not in a hospital admission)  OB/GYN Status:  No LMP recorded. Patient has had a hysterectomy.  General Assessment Data Location of Assessment: [facility] ED TTS Assessment: In system Is this a Tele or Face-to-Face Assessment?: Face-to-Face Is this an Initial Assessment or a Re-assessment for this encounter?: Initial Assessment Marital status: Single Maiden name: n/a Is patient pregnant?: No Living Arrangements: Alone Can pt return to current living arrangement?: Yes Admission Status: Voluntary Is patient capable of signing voluntary admission?: Yes Referral Source: Self/Family/Friend Insurance type:  Medicare  Medical Screening Exam [facility] Walk-in ONLY) Medical Exam completed: Yes  Crisis Care Plan Living Arrangements: Alone Legal Guardian: Other:(Self) Name of Psychiatrist: Dr. Leonides Schanz ([facility]) Name of Therapist: [facility] ([facility])  Education Status Is patient currently in school?: No Current Grade: n/a Highest grade of school patient has completed: n/a Name of  school: n/a Contact person: n/a  Risk to self with the past 6 months Suicidal Ideation: No-Not Currently/Within Last 6 Months Has patient been a risk to self within the past 6 months prior to admission? : No Suicidal Intent: No Has patient had any suicidal intent within the past 6 months prior to admission? : No Is patient at risk for suicide?: No Suicidal Plan?: No Has patient had any suicidal plan within the past 6 months prior to admission? : No Access to Means: No What has been your use of drugs/alcohol within the last 12 months?: Reports of none Previous Attempts/Gestures: Yes How many times?: 1("Years ago") Other Self Harm Risks: Reports of none Triggers for Past Attempts: Unknown Intentional Self Injurious Behavior: None Family Suicide History: No Recent stressful life event(s): Other (Comment)(Engaging more in treatment) Persecutory voices/beliefs?: No Depression: No Depression Symptoms: Feeling worthless/self pity, Loss of interest in usual pleasures Substance abuse history and/or treatment for substance abuse?: No Suicide prevention information given to non-admitted patients: Not applicable  Risk to Others within the past 6 months Homicidal Ideation: No Does patient have any lifetime risk of violence toward others beyond the six months prior to admission? : No Thoughts of Harm to Others: No Current Homicidal Intent: No Current Homicidal Plan: No Access to Homicidal Means: No Identified Victim: Reports of none History of harm to others?: No Assessment of Violence: None Noted Violent Behavior Description: Reports of none Does patient have access to weapons?: No Criminal Charges Pending?: No Does patient have a court date: No Is patient on probation?: No  Psychosis Hallucinations: None noted Delusions: None noted  Mental Status Report Appearance/Hygiene: Unremarkable, In scrubs Eye Contact: Fair Motor Activity: Freedom of movement, Unremarkable Speech:  Unremarkable, Logical/coherent Level of Consciousness: Alert Mood: Depressed, Anxious, Labile Affect: Labile, Depressed, Fearful Anxiety Level: Minimal Thought Processes: Flight of Ideas Judgement: Partial Orientation: Person, Place, Time, Situation, Appropriate for developmental age Obsessive Compulsive Thoughts/Behaviors: Minimal  Cognitive Functioning Concentration: Normal Memory: Recent Intact, Remote Intact IQ: Average Insight: Fair Impulse Control: Fair Appetite: Fair Weight Loss: 0 Weight Gain: 0 Sleep: No Change Total Hours of Sleep: 8 Vegetative Symptoms: None  ADLScreening [facility] Assessment Services) Patient's cognitive ability adequate to safely complete daily activities?: Yes Patient able to express need for assistance with ADLs?: Yes Independently performs ADLs?: Yes (appropriate for developmental age)  Prior Inpatient Therapy Prior Inpatient Therapy: Yes Prior Therapy Dates: 09/2014, 03/2012, 11/2009, (05/2006 & 05/2005) Prior Therapy Facilty/Provider(s): [facility] Reason for Treatment: Bipolar  Prior Outpatient Therapy Prior Outpatient Therapy: Yes Prior Therapy Dates: Current Prior Therapy Facilty/Provider(s): [facility] (CST) Reason for Treatment: Bipolar Does patient have an ACCT team?: No Does patient have Intensive In-House Services?  : No Does patient have [facility] services? : No Does patient have P4CC services?: No  ADL Screening (condition at time of admission) Patient's cognitive ability adequate to safely complete daily activities?: Yes Is the patient deaf or have difficulty hearing?: No Does the patient have difficulty seeing, even when wearing glasses/contacts?: No Does the patient have difficulty concentrating, remembering, or making decisions?: No Patient able to express need for assistance with ADLs?: Yes Does the patient have difficulty dressing or  bathing?: No Independently performs ADLs?: Yes (appropriate for developmental age) Does the  patient have difficulty walking or climbing stairs?: No Weakness of Legs: None Weakness of Arms/Hands: None  Home Assistive Devices/Equipment Home Assistive Devices/Equipment: None  Therapy Consults (therapy consults require a physician order) PT Evaluation Needed: No OT Evalulation Needed: No SLP Evaluation Needed: No Abuse/Neglect Assessment (Assessment to be complete while patient is alone) Abuse/Neglect Assessment Can Be Completed: Yes Physical Abuse: Denies Verbal Abuse: Denies Sexual Abuse: Denies Exploitation of patient/patient's resources: Denies Self-Neglect: Denies Values / Beliefs Cultural Requests During Hospitalization: None Spiritual Requests During Hospitalization: None Consults Spiritual Care Consult Needed: No Social Work Consult Needed: No Regulatory affairs officer (For Healthcare) Does Patient Have a Medical Advance Directive?: No Would patient like information on creating a medical advance directive?: No - Patient declined    Additional Information 1:1 In Past 12 Months?: No CIRT Risk: No Elopement Risk: No Does patient have medical clearance?: Yes  Child/Adolescent Assessment Running Away Risk: Denies(Patient is an adult)  Disposition:     On Site Evaluation by:   Reviewed with Physician:    Gunnar Fusi MS, LCAS, [place], [place], CCSI Therapeutic Triage Specialist 07/07/2017 1:00 PM

## 2017-07-07 NOTE — Progress Notes (Signed)
Admission Note:   Report was given by Melvenia Beam, RN AT 5227 on a 72 year old female who presents voluntary in no acute distress for the treatment of Depression and "irrational fear that somebody is going to hurt me". Patient states that she doesn't know what is making her feel this way. Patient appears depressed rating her depression a "2/10" stating that she's depressed from "not knowin. Patient was calm and cooperative with admission process. Patient contracts for safety upon admission. Patient denies SI/HI/AVH as well as any signs/symptoms of anxiety at this time. Patient reports "past attempts of hurting myself, but not now". Patient has upper dentures and glasses at the bedside. Patient has a past medical history of HTN, Arthritis, Cancer and DM without complication, and hypothyroidism.   Patient's CBG was 150 upon admission. Patient states that she lives alone and doesn't have any family support. Patient has been receiving OTP services at [facility]. Skin was assessed with S[place],RN, and found to be clear of any abnormal marks. Patient searched and no contraband found, unit policies explained and understanding verbalized. Consents obtained. Food and fluids offered, and accepted. Patient had no additional questions or concerns at this time.

## 2017-07-07 NOTE — ED Notes (Signed)
Pt dressed out in hosp app scrubs with this RN and Pam, Nurse Tech, pt belongings pants, shoes, footies, shirt and coat, keys and one flip cell phone placed in pt belongings bag with pt label on the bag.

## 2017-07-07 NOTE — Plan of Care (Signed)
New admission

## 2017-07-07 NOTE — Progress Notes (Signed)
Patient is adjusting well in the unit, patient took all her medicines as ordered, appetite is good as patient is eating more than 50% of meals and well hydrated, patient denies any thoughts of suicidal ideation or HI, and there is no signs of any  AVH at this time. Patient is sleeping long house in room with eyes closed no distress noted, 15 minute room check continues.

## 2017-07-07 NOTE — BH Assessment (Signed)
Patient is to be admitted to [facility] by Dr. Weber Cooks.  Attending Physician will be Dr. Bary Leriche.   Patient has been assigned to room 310, by [place].   Intake Paper Work has been signed and placed on patient chart.  ER staff is aware of the admission:   Dr. Alfred Levins, ER MD   Laymond Purser., Patient's Nurse   Genella Rife, Patient Access.
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
## 2017-07-07 NOTE — Tx Team (Signed)
Initial Treatment Plan 07/07/2017 4:53 PM BRAILYN KILLION YBO:[id number]    PATIENT STRESSORS: Medication change or noncompliance   PATIENT STRENGTHS: Ability for insight General fund of knowledge Motivation for treatment/growth Supportive family/friends   PATIENT IDENTIFIED PROBLEMS: Irrational fear; "paranoia"  Depression                   DISCHARGE CRITERIA:  Ability to meet basic life and health needs Improved stabilization in mood, thinking, and/or behavior  PRELIMINARY DISCHARGE PLAN: Return to previous living arrangement  PATIENT/FAMILY INVOLVEMENT: This treatment plan has been presented to and reviewed with the patient, NAYSHA SHOLL.  The patient has been given the opportunity to ask questions and make suggestions.  Zekiah Coen, RN 07/07/2017, 4:53 PM
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
## 2017-07-07 NOTE — ED Triage Notes (Signed)
Pt here voluntary today with c/o being scared of someone will hurt her. Pt is very paranoid and tearful in triage. Pt denies any thoughts of SI or HI. Pt called 911 for assistance and the paramedics called [facility]. Pt has been here in the past for same but could never "open up" about things.

## 2017-07-08 DIAGNOSIS — F319 Bipolar disorder, unspecified: Secondary | ICD-10-CM

## 2017-07-08 LAB — GLUCOSE, CAPILLARY
GLUCOSE-CAPILLARY: 150 mg/dL — AB (ref 65–99)
GLUCOSE-CAPILLARY: 135 mg/dL — AB (ref 65–99)
Glucose-Capillary: 119 mg/dL — ABNORMAL HIGH (ref 65–99)
Glucose-Capillary: 164 mg/dL — ABNORMAL HIGH (ref 65–99)

## 2017-07-08 LAB — HEMOGLOBIN A1C
Hgb A1c MFr Bld: 6.4 % — ABNORMAL HIGH (ref 4.8–5.6)
MEAN PLASMA GLUCOSE: 136.98 mg/dL

## 2017-07-08 LAB — TSH: TSH: 2.328 u[IU]/mL (ref 0.350–4.500)

## 2017-07-08 LAB — LIPID PANEL
Cholesterol: 178 mg/dL (ref 0–200)
HDL: 46 mg/dL (ref >40)
LDL Cholesterol: 94 mg/dL (ref 0–99)
Total CHOL/HDL Ratio: 3.9 RATIO
Triglycerides: 190 mg/dL — ABNORMAL HIGH (ref <150)
VLDL: 38 mg/dL (ref 0–40)

## 2017-07-08 MED ORDER — CLONAZEPAM 1 MG PO TABS
1.0000 mg | ORAL_TABLET | Freq: Every day | ORAL | Status: DC
  Administered 2017-07-08: 1 mg via ORAL
  Filled 2017-07-08 (×2): qty 1

## 2017-07-08 MED ORDER — OLOPATADINE HCL 0.1 % OP SOLN
1.0000 [drp] | Freq: Every day | OPHTHALMIC | Status: DC
  Administered 2017-07-09: 1 [drp] via OPHTHALMIC
  Filled 2017-07-08: qty 5

## 2017-07-08 MED ORDER — FLUTICASONE PROPIONATE 50 MCG/ACT NA SUSP
2.0000 | Freq: Every day | NASAL | Status: DC
  Administered 2017-07-09 – 2017-07-10 (×2): 2 via NASAL
  Filled 2017-07-08: qty 16

## 2017-07-08 MED ORDER — LEVOTHYROXINE SODIUM 75 MCG PO TABS
75.0000 ug | ORAL_TABLET | Freq: Every day | ORAL | Status: DC
  Administered 2017-07-09 – 2017-07-10 (×2): 75 ug via ORAL
  Filled 2017-07-08 (×2): qty 1

## 2017-07-08 NOTE — Plan of Care (Signed)
Patient has shown interest in activities by attending unit groups and interacting well with others on the unit without any issues. Patient states that she slept "wonderful" last night. Patient verbalizes understanding of the general information that has been provided to her as well as her prescribed therapeutic regimen and has not voiced any further questions/concerns at this time. Patient denies SI/HI/AVH at this time. Patient also denies any signs/symptoms of anxiety at this time. Patient endorses a depression level of "2/10" stating "I feel better". Patient has remained free from injury on the unit thus far and is safe on the unit at this time.

## 2017-07-08 NOTE — [facility] Group Notes (Signed)
07/08/2017 1PM  Type of Therapy/Topic:  Group Therapy:  Feelings about Diagnosis  Participation Level:  Active   Description of Group:   This group will allow patients to explore their thoughts and feelings about diagnoses they have received. Patients will be guided to explore their level of understanding and acceptance of these diagnoses. Facilitator will encourage patients to process their thoughts and feelings about the reactions of others to their diagnosis and will guide patients in identifying ways to discuss their diagnosis with significant others in their lives. This group will be process-oriented, with patients participating in exploration of their own experiences, giving and receiving support, and processing challenge from other group members.   Therapeutic Goals: 1. Patient will demonstrate understanding of diagnosis as evidenced by identifying two or more symptoms of the disorder 2. Patient will be able to express two feelings regarding the diagnosis 3. Patient will demonstrate their ability to communicate their needs through discussion and/or role play  Summary of Patient Progress: Actively and appropriately engaged in the group. Patient was able to provide support and validation to other group members.Patient practiced active listening when interacting with the facilitator and other group members Patient in still in the process of obtaining treatment goals. [name] presented very angry at group stating that the providers are giving her more help that what she asked for and what she needs. She says that she only came to get her medications because she felt she needed a little more. She attempted to take over the group with her angry outburst and needed to be redirected several times.    Therapeutic Modalities:   Cognitive Behavioral Therapy Brief Therapy Feelings Identification    Cindy Engels, LCSW 07/08/2017 2:42 PM

## 2017-07-08 NOTE — [facility] Suicide Risk Assessment (Signed)
[place] INPATIENT:  Family/Significant Other Suicide Prevention Education  Suicide Prevention Education:  Patient Refusal for Family/Significant Other Suicide Prevention Education: The patient [name] has refused to provide written consent for family/significant other to be provided Family/Significant Other Suicide Prevention Education during admission and/or prior to discharge.  Physician notified.  Darin Engels 07/08/2017, 10:17 AM

## 2017-07-08 NOTE — [facility] Group Notes (Signed)
  07/08/2017  Time: 0900  Type of Therapy and Topic:  Group Therapy:  Setting Goals Participation Level:  Active  Description of Group: In this process group, patients discussed using strengths to work toward goals and address challenges.  Patients identified two positive things about themselves and one goal they were working on.  Patients were given the opportunity to share openly and support each other's plan for self-empowerment.  The group discussed the value of gratitude and were encouraged to have a daily reflection of positive characteristics or circumstances.  Patients were encouraged to identify a plan to utilize their strengths to work on current challenges and goals.  Therapeutic Goals 1. Patient will verbalize personal strengths/positive qualities and relate how these can assist with achieving desired personal goals 2. Patients will verbalize affirmation of peers plans for personal change and goal setting 3. Patients will explore the value of gratitude and positive focus as related to successful achievement of goals 4. Patients will verbalize a plan for regular reinforcement of personal positive qualities and circumstances.  Summary of Patient Progress: Pt continues to work towards their tx goals but has not yet reached them. Pt was able to appropriately participate in group discussion, and was able to offer support/validation to other group members. Pt reported she is feeling, "well today because I slept better than I have in a while." Pt reported her goal is, "to attend at least two groups per day for the rest of the week."   Therapeutic Modalities Cognitive Behavioral Therapy Motivational Interviewing  Alden Hipp, MSW, LCSW 07/08/2017 9:41 AM
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
## 2017-07-08 NOTE — Progress Notes (Signed)
D- Patient alert and oriented. Patient presents in a pleasant mood on assessment stating that she slept "wonderful" last night. Patient rates her level of depression a "1/10" stating "I feel better". Patient reports that she has pain in her fingers stating "it's not enough to ask for meds, I try to stay away from tylenol". Patient denies SI, HI, AVH, and anxiety at this time. Patient's goal for today is "to attend groups and hopefully to continue to feel improvement".  A- Scheduled medications administered to patient, per MD orders. Support and encouragement provided.  Routine safety checks conducted every 15 minutes.  Patient informed to notify staff with problems or concerns.  R- No adverse drug reactions noted. Patient contracts for safety at this time. Patient compliant with medications and treatment plan. Patient receptive, calm, and cooperative. Patient interacts well with others on the unit.  Patient remains safe at this time.

## 2017-07-08 NOTE — BHH Suicide Risk Assessment (Signed)
[facility] Admission Suicide Risk Assessment   Nursing information obtained from:  Patient Demographic factors:  Age 72 or older, Caucasian, Living alone Current Mental Status:  NA Loss Factors:  NA Historical Factors:  Prior suicide attempts Risk Reduction Factors:  Positive social support  Total Time spent with patient: 1 hour Principal Problem: Bipolar I disorder, most recent episode depressed with anxious distress ([place]) Diagnosis:   Patient Active Problem List   Diagnosis Date Noted  . Bipolar I disorder, most recent episode depressed with anxious distress ([place]) [F31.9] 07/07/2017    Priority: High  . Bipolar I disorder, current or most recent episode manic, severe with mixed features ([place]) [F31.13] 09/20/2014    Priority: High  . PTSD (post-traumatic stress disorder) [F43.10] 07/07/2017  . Adjustment disorder with mixed disturbance of emotions and conduct [F43.25] 10/11/2016  . Diabetes ([place]) [E11.9] 09/23/2014  . Essential hypertension [I10] 09/21/2014  . Hypothyroidism [E03.9] 09/21/2014  . Dyslipidemia [E78.5] 09/21/2014   Subjective Data: depression, anxiety  Continued Clinical Symptoms:  Alcohol Use Disorder Identification Test Final Score (AUDIT): 0 The "Alcohol Use Disorders Identification Test", Guidelines for Use in Primary Care, Second Edition.  World Pharmacologist [facility]). Score between 0-7:  no or low risk or alcohol related problems. Score between 8-15:  moderate risk of alcohol related problems. Score between 16-19:  high risk of alcohol related problems. Score 20 or above:  warrants further diagnostic evaluation for alcohol dependence and treatment.   CLINICAL FACTORS:   Bipolar Disorder:   Mixed State Depression:   Impulsivity   Musculoskeletal: Strength & Muscle Tone: within normal limits Gait & Station: normal Patient leans: N/A  Psychiatric Specialty Exam: Physical Exam  Nursing note and vitals reviewed. Psychiatric: Her mood appears anxious. Her  affect is angry and labile. Her speech is rapid and/or pressured. She is hyperactive. Thought content is paranoid. Cognition and memory are normal. She expresses impulsivity.    Review of Systems  Neurological: Negative.   Psychiatric/Behavioral: Positive for depression. The patient is nervous/anxious.   All other systems reviewed and are negative.   Blood pressure 118/65, pulse 66, temperature 98.9 F (37.2 C), temperature source Oral, resp. rate 18, height 5' 1.02" (1.55 m), weight 70.8 kg (156 lb), SpO2 97 %.Body mass index is 29.45 kg/m.  General Appearance: Casual  Eye Contact:  Good  Speech:  Clear and Coherent and Pressured  Volume:  Increased  Mood:  Dysphoric  Affect:  Congruent  Thought Process:  Goal Directed and Descriptions of Associations: Intact  Orientation:  Full (Time, Place, and Person)  Thought Content:  Illogical, Delusions and Paranoid Ideation  Suicidal Thoughts:  No  Homicidal Thoughts:  No  Memory:  Immediate;   Fair Recent;   Fair Remote;   Fair  Judgement:  Impaired  Insight:  Shallow  Psychomotor Activity:  Increased  Concentration:  Concentration: Fair and Attention Span: Fair  Recall:  [employer] of Knowledge:  Fair  Language:  Fair  Akathisia:  No  Handed:  Right  AIMS (if indicated):     Assets:  Communication Skills Desire for Improvement Financial Resources/Insurance Housing Physical Health Resilience  ADL's:  Intact  Cognition:  WNL  Sleep:  Number of Hours: 8.15      COGNITIVE FEATURES THAT CONTRIBUTE TO RISK:  None    SUICIDE RISK:   Moderate:  Frequent suicidal ideation with limited intensity, and duration, some specificity in terms of plans, no associated intent, good self-control, limited dysphoria/symptomatology, some risk factors present,  and identifiable protective factors, including available and accessible social support.  PLAN OF CARE: hospital admission, medication management, discharge planning.  [name] is a  72 year old female with a history of bipolar disorder admitted for anxious distress.  #Mood -started Prozac 20 mg that was helpful in the past -refuses mood stabilizer -Trazodone 100 mg nightly  #Anxiety -Vistaril 25 mg PRN -Klonopin 1 mg at 20:00 -consider Minipress  #Hypothyroidism -Synthroid 75 mcg daily  #HTN -Enalapril 20 mg BID  #Diabetes -Glipizide 5 mg daily  #Dyslipidemia -Zocor 20 mg daily  #Metabolic syndrome monitoring -Lipid panel, TSH and HgbA1C are pending -EKG, pending  #Disposition -discharge to her apartment -follow up with [facility] -follow up with Dr. Vedia Coffer    I certify that inpatient services furnished can reasonably be expected to improve the patient's condition.   Orson Slick, MD 07/08/2017, 11:45 AM
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
## 2017-07-08 NOTE — Progress Notes (Signed)
Initial Nutrition Assessment  REASON FOR ASSESSMENT:   Consult Assessment of nutrition requirement/status  ASSESSMENT:   72 year old woman with a history of chronic mood and anxiety symptoms who comes to the emergency room with paranoia    RD received consult for pts reports 80lb weight loss.   Per chart review pt's weight history is: 7/ 2014- 149lbs 09/2014- 160lbs 12/2015- 149lbs 09/2016- 143lbs 01/2017- 145lbs 02/2017- 150lbs  07/07/2017- 156lbs  Pt does not appear to have any recent weight loss within the past 5 years. RD suspects pt may have had weight loss in 2004 when she was diagnosed with breast cancer. Per chart, pt eating 100% of meals in hospital. No nutrition interventions needed at this time.    Medications reviewed and include: aspirin, synthroid  Labs reviewed:   Diet Order:  Diet heart healthy/carb modified Room service appropriate? Yes; Fluid consistency: Thin  Skin:  Reviewed RN Assessment  Last BM:  2/18  Height:   Ht Readings from Last 1 Encounters:  07/07/17 5' 1.02" (1.55 m)    Weight:   Wt Readings from Last 1 Encounters:  07/07/17 156 lb (70.8 kg)    Ideal Body Weight:  47.7 kg  BMI:  Body mass index is 29.45 kg/m.  Estimated Nutritional Needs:   Kcal:  1400-1600kcal/day   Protein:  68-75g/day   Fluid:  >1.4L/day   Koleen Distance MS, RD, LDN Pager #[phone] After Hours Pager: [phone]

## 2017-07-08 NOTE — Plan of Care (Signed)
  Progressing Activity: Interest or engagement in activities will improve 07/08/2017 [place] by Clemens Catholic, RN Sleeping patterns will improve 07/08/2017 1953 - Progressing by Clemens Catholic, RN Education: Mental status will improve 07/08/2017 1953 - Progressing by Clemens Catholic, RN Safety: Periods of time without injury will increase 07/08/2017 1953 - Progressing by Clemens Catholic, RN Education: Will be free of psychotic symptoms 07/08/2017 1953 - Progressing by Clemens Catholic, RN

## 2017-07-08 NOTE — BHH Group Notes (Signed)
[facility] Group Notes:  (Nursing/MHT/Case Management/Adjunct)  Date:  07/08/2017  Time:  9:42 PM  Type of Therapy:  Group Therapy  Participation Level:  Did Not Attend   Summary of Progress/Problems:  [name] 07/08/2017, 9:42 PM

## 2017-07-09 LAB — GLUCOSE, CAPILLARY: GLUCOSE-CAPILLARY: 100 mg/dL — AB (ref 65–99)

## 2017-07-09 MED ORDER — CLONAZEPAM 0.5 MG PO TABS
0.5000 mg | ORAL_TABLET | Freq: Every day | ORAL | Status: DC
  Administered 2017-07-09: 0.5 mg via ORAL
  Filled 2017-07-09: qty 1

## 2017-07-09 NOTE — BHH Group Notes (Signed)
[facility] Group Notes:  (Nursing/MHT/Case Management/Adjunct)  Date:  07/09/2017  Time:  2:48 PM  Type of Therapy:  Psychoeducational Skills  Participation Level:  Active  Participation Quality:  Appropriate  Affect:  Appropriate  Cognitive:  Alert and Appropriate  Insight:  Appropriate  Engagement in Group:  Engaged  Modes of Intervention:  Discussion, Education and Support  Summary of Progress/Problems:  Adela Lank [facility] 07/09/2017, 2:48 PM
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
## 2017-07-09 NOTE — Tx Team (Signed)
Interdisciplinary Treatment and Diagnostic Plan Update  07/09/2017 Time of Session: 10:30am [name] MRN: [id number]  Principal Diagnosis: Bipolar I disorder, most recent episode depressed with anxious distress ([place])  Secondary Diagnoses: Principal Problem:   Bipolar I disorder, most recent episode depressed with anxious distress ([place]) Active Problems:   Essential hypertension   Hypothyroidism   Dyslipidemia   Diabetes ([place])   PTSD (post-traumatic stress disorder)   Current Medications:  Current Facility-Administered Medications  Medication Dose Route Frequency Provider Last Rate Last Dose  . acetaminophen (TYLENOL) tablet 650 mg  650 mg Oral Q6H PRN Clapacs, John T, MD      . alum & mag hydroxide-simeth (MAALOX/MYLANTA) 200-200-20 MG/5ML suspension 30 mL  30 mL Oral Q4H PRN Clapacs, John T, MD      . aspirin EC tablet 81 mg  81 mg Oral Daily Clapacs, Madie Reno, MD   81 mg at 07/09/17 0855  . clonazePAM (KLONOPIN) tablet 1 mg  1 mg Oral Q2000 Pucilowska, Jolanta B, MD   1 mg at 07/08/17 2005  . enalapril (VASOTEC) tablet 20 mg  20 mg Oral BID Clapacs, Madie Reno, MD   Stopped at 07/09/17 [phone]  . FLUoxetine (PROZAC) capsule 20 mg  20 mg Oral Daily Clapacs, Madie Reno, MD   20 mg at 07/09/17 0855  . fluticasone (FLONASE) 50 MCG/ACT nasal spray 2 spray  2 spray Each Nare Daily Pucilowska, Jolanta B, MD   2 spray at 07/09/17 0855  . glipiZIDE (GLUCOTROL XL) 24 hr tablet 5 mg  5 mg Oral BID WC Clapacs, Madie Reno, MD   5 mg at 07/09/17 0855  . levothyroxine (SYNTHROID, LEVOTHROID) tablet 75 mcg  75 mcg Oral Q0600 Pucilowska, Jolanta B, MD   75 mcg at 07/09/17 0616  . loratadine (CLARITIN) tablet 10 mg  10 mg Oral Daily Clapacs, Madie Reno, MD   10 mg at 07/09/17 0855  . magnesium hydroxide (MILK OF MAGNESIA) suspension 30 mL  30 mL Oral Daily PRN Clapacs, John T, MD      . olopatadine (PATANOL) 0.1 % ophthalmic solution 1 drop  1 drop Both Eyes Q0600 Pucilowska, Jolanta B, MD   1 drop at 07/09/17 0617   . simvastatin (ZOCOR) tablet 20 mg  20 mg Oral q1800 Clapacs, Madie Reno, MD   20 mg at 07/08/17 1725  . traZODone (DESYREL) tablet 100 mg  100 mg Oral QHS Clapacs, John T, MD   100 mg at 07/08/17 2142   PTA Medications: Medications Prior to Admission  Medication Sig Dispense Refill Last Dose  . aspirin 81 MG EC tablet Take 1 tablet (81 mg total) by mouth daily. Swallow whole. 30 tablet 12 unknown at unknown  . cetirizine (ZYRTEC) 10 MG tablet Take 10 mg by mouth daily.   unknown at unknown  . clonazePAM (KLONOPIN) 1 MG tablet Take 1 tablet (1 mg total) by mouth at bedtime. 30 tablet 0 unknown at unknown  . enalapril (VASOTEC) 20 MG tablet Take 1 tablet (20 mg total) by mouth 2 (two) times daily. 30 tablet 0 unknown at unknown  . glipiZIDE (GLUCOTROL XL) 5 MG 24 hr tablet Take 5 mg by mouth 2 (two) times daily.    unknown at unknown  . levothyroxine (SYNTHROID, LEVOTHROID) 75 MCG tablet Take 1 tablet (75 mcg total) by mouth every morning. 30 tablet 0 unknown at unknown  . Olopatadine HCl 0.2 % SOLN Place 1 drop into both eyes daily.    unknown at unknown  .  simvastatin (ZOCOR) 20 MG tablet Take 1 tablet (20 mg total) by mouth daily. 30 tablet 0 unknown at unknown  . traZODone (DESYREL) 100 MG tablet Take 1 tablet (100 mg total) by mouth at bedtime. 30 tablet 0 unknown at unknown    Patient Stressors: Medication change or noncompliance  Patient Strengths: Ability for insight General fund of knowledge Motivation for treatment/growth Supportive family/friends  Treatment Modalities: Medication Management, Group therapy, Case management,  1 to 1 session with clinician, Psychoeducation, Recreational therapy.   Physician Treatment Plan for Primary Diagnosis: Bipolar I disorder, most recent episode depressed with anxious distress ([place]) Long Term Goal(s): Improvement in symptoms so as ready for discharge NA   Short Term Goals: Ability to identify changes in lifestyle to reduce recurrence of  condition will improve Ability to verbalize feelings will improve Ability to disclose and discuss suicidal ideas Ability to demonstrate self-control will improve Ability to identify and develop effective coping behaviors will improve Ability to identify triggers associated with substance abuse/mental health issues will improve NA  Medication Management: Evaluate patient's response, side effects, and tolerance of medication regimen.  Therapeutic Interventions: 1 to 1 sessions, Unit Group sessions and Medication administration.  Evaluation of Outcomes: Progressing  Physician Treatment Plan for Secondary Diagnosis: Principal Problem:   Bipolar I disorder, most recent episode depressed with anxious distress ([place]) Active Problems:   Essential hypertension   Hypothyroidism   Dyslipidemia   Diabetes ([place])   PTSD (post-traumatic stress disorder)  Long Term Goal(s): Improvement in symptoms so as ready for discharge NA   Short Term Goals: Ability to identify changes in lifestyle to reduce recurrence of condition will improve Ability to verbalize feelings will improve Ability to disclose and discuss suicidal ideas Ability to demonstrate self-control will improve Ability to identify and develop effective coping behaviors will improve Ability to identify triggers associated with substance abuse/mental health issues will improve NA     Medication Management: Evaluate patient's response, side effects, and tolerance of medication regimen.  Therapeutic Interventions: 1 to 1 sessions, Unit Group sessions and Medication administration.  Evaluation of Outcomes: Progressing   RN Treatment Plan for Primary Diagnosis: Bipolar I disorder, most recent episode depressed with anxious distress ([place]) Long Term Goal(s): Knowledge of disease and therapeutic regimen to maintain health will improve  Short Term Goals: Ability to verbalize feelings will improve, Ability to identify and develop effective  coping behaviors will improve and Compliance with prescribed medications will improve  Medication Management: RN will administer medications as ordered by provider, will assess and evaluate patient's response and provide education to patient for prescribed medication. RN will report any adverse and/or side effects to prescribing provider.  Therapeutic Interventions: 1 on 1 counseling sessions, Psychoeducation, Medication administration, Evaluate responses to treatment, Monitor vital signs and CBGs as ordered, Perform/monitor CIWA, COWS, AIMS and Fall Risk screenings as ordered, Perform wound care treatments as ordered.  Evaluation of Outcomes: Progressing   LCSW Treatment Plan for Primary Diagnosis: Bipolar I disorder, most recent episode depressed with anxious distress ([place]) Long Term Goal(s): Safe transition to appropriate next level of care at discharge, Engage patient in therapeutic group addressing interpersonal concerns.  Short Term Goals: Engage patient in aftercare planning with referrals and resources, Facilitate acceptance of mental health diagnosis and concerns, Identify triggers associated with mental health/substance abuse issues and Increase skills for wellness and recovery  Therapeutic Interventions: Assess for all discharge needs, 1 to 1 time with Social worker, Explore available resources and support systems, Assess for adequacy  in community support network, Educate family and significant other(s) on suicide prevention, Complete Psychosocial Assessment, Interpersonal group therapy.  Evaluation of Outcomes: Progressing   Progress in Treatment: Attending groups: Yes. Participating in groups: Yes. Taking medication as prescribed: Yes. Toleration medication: Yes. Family/Significant other contact made: No, will contact:  Patient refused. Patient understands diagnosis: Yes. Discussing patient identified problems/goals with staff: Yes. Medical problems stabilized or resolved:  Yes. Denies suicidal/homicidal ideation: Yes. Issues/concerns per patient self-inventory: Yes. Other:   New problem(s) identified: No, Describe:  None  New Short Term/Long Term Goal(s): "To live in peace and be happy. To be free to come and go as I please."   Discharge Plan or Barriers: 3-5days  Reason for Continuation of Hospitalization: Medication stabilization  Estimated Length of Stay: 3-5 days  Attendees: Patient: [name] 07/09/2017 11:11 AM  Physician: Dr. Bary Leriche, MD 07/09/2017 11:11 AM  Nursing: Polly Cobia, RN 07/09/2017 11:11 AM  RN Care Manager: 07/09/2017 11:11 AM  Social Worker: Darin Engels, [place] 07/09/2017 11:11 AM  Recreational Therapist:  07/09/2017 11:11 AM  Other: Alden Hipp, LCSW 07/09/2017 11:11 AM  Other:  07/09/2017 11:11 AM  Other: 07/09/2017 11:11 AM    Scribe for Treatment Team: Darin Engels, LCSW 07/09/2017 11:11 AM

## 2017-07-09 NOTE — Progress Notes (Signed)
D- Patient alert and oriented. Patient presents in a pleasant mood on assessment stating that she slept "wonderful" last night, "I feel a little groggy, I was on 0.5 of clonazepam and I took 1mg  last night". Patient denies SI, HI, AVH, and pain at this time. Patient also denies any signs/symptoms of depression and anxiety stating "I feel wonderful, better than when I came in". Patient's goal for today is to "live in peace and be happy".  A- Scheduled medications administered to patient, per MD orders. Support and encouragement provided.  Routine safety checks conducted every 15 minutes.  Patient informed to notify staff with problems or concerns.  R- No adverse drug reactions noted. Patient contracts for safety at this time. Patient compliant with medications and treatment plan. Patient receptive, calm, and cooperative. Patient interacts well with others on the unit.  Patient remains safe at this time.

## 2017-07-09 NOTE — Plan of Care (Signed)
Patient has shown interest in activities by attending unit groups and interacting well with others on the unit without any issues. Patient states that she slept "wonderful" last night "I feel a little groggy, drunk" because patient states that she was on 0.5 mg of Clonazepam and the doctor switched her to 1 mg. Patient verbalizes understanding of the general information that has been provided to her as well as her prescribed therapeutic regimen and has not voiced any further questions/concerns at this time. Patient denies SI/HI/AVH at this time. Patient also denies any signs/symptoms of depression/anxiety at this time. Patient states "I feel wonderful, better than when I came in". Patient also states that her goal for today is "to live in peace and be happy". Patient has remained free from injury on the unit thus far and is safe on the unit at this time.

## 2017-07-09 NOTE — Progress Notes (Signed)
[facility] MD Progress Note  07/09/2017 1:56 PM [name]  MRN:  [id number]  Subjective:   [name] met with teatment team today. She already feels better and believes that her Prozac is working already. She is loud, talkative, robust. Still very paranoid and blaming her family. No somatic complaints. Asking to decrease Clonazepam dose as she slept too much.  Treatment plan. We will continue Prozac 20 mg, Trazodone 100 mg and Clonazepam 0.5 mg at 20:00 PM. Refuses mood stabilizers.  Social/disposition. Discharge to her apartment. Follow up with Dr. Lennice Sites at [facility].  Principal Problem: Bipolar I disorder, most recent episode depressed with anxious distress ([place]) Diagnosis:   Patient Active Problem List   Diagnosis Date Noted  . Bipolar I disorder, most recent episode depressed with anxious distress ([place]) [F31.9] 07/07/2017    Priority: High  . Bipolar I disorder, current or most recent episode manic, severe with mixed features ([place]) [F31.13] 09/20/2014    Priority: High  . PTSD (post-traumatic stress disorder) [F43.10] 07/07/2017  . Adjustment disorder with mixed disturbance of emotions and conduct [F43.25] 10/11/2016  . Diabetes ([place]) [E11.9] 09/23/2014  . Essential hypertension [I10] 09/21/2014  . Hypothyroidism [E03.9] 09/21/2014  . Dyslipidemia [E78.5] 09/21/2014   Total Time spent with patient: 30 minutes  Past Psychiatric History: bipolar disorder  Past Medical History:  Past Medical History:  Diagnosis Date  . Arthritis   . Cancer [facility]) 2004   right breast ca  . Diabetes mellitus without complication ([place])   . Hypercholesterolemia   . Hypertension   . Hypothyroid   . Seasonal allergies     Past Surgical History:  Procedure Laterality Date  . ABDOMINAL HYSTERECTOMY    . BREAST BIOPSY Left    neg  . BREAST EXCISIONAL BIOPSY Right 2004   breast ca radation and lumpectomy  . BREAST LUMPECTOMY Right 2004  . CHOLECYSTECTOMY    . COLONOSCOPY WITH PROPOFOL N/A 02/13/2017    Procedure: COLONOSCOPY WITH PROPOFOL;  Surgeon: Jonathon Bellows, MD;  Location: [facility] ENDOSCOPY;  Service: Gastroenterology;  Laterality: N/A;  . KIDNEY STONE SURGERY     Family History:  Family History  Problem Relation Age of Onset  . Bladder Cancer Neg Hx   . Kidney cancer Neg Hx    Family Psychiatric  History: bipolar disorder Social History:  Social History   Substance and Sexual Activity  Alcohol Use No     Social History   Substance and Sexual Activity  Drug Use No    Social History   Socioeconomic History  . Marital status: Divorced    Spouse name: None  . Number of children: None  . Years of education: None  . Highest education level: None  Social Needs  . Financial resource strain: None  . Food insecurity - worry: None  . Food insecurity - inability: None  . Transportation needs - medical: None  . Transportation needs - non-medical: None  Occupational History  . None  Tobacco Use  . Smoking status: Former Smoker    Types: Cigarettes  . Smokeless tobacco: Never Used  Substance and Sexual Activity  . Alcohol use: No  . Drug use: No  . Sexual activity: Not Currently  Other Topics Concern  . None  Social History Narrative  . None   Additional Social History:    Pain Medications: See PTA Prescriptions: See PTA Over the Counter: See PTA History of alcohol / drug use?: No history of alcohol / drug abuse Longest period of sobriety (  when/how long): N/A Negative Consequences of Use: (N/A) Withdrawal Symptoms: (N/A)                    Sleep: Fair  Appetite:  Fair  Current Medications: Current Facility-Administered Medications  Medication Dose Route Frequency Provider Last Rate Last Dose  . acetaminophen (TYLENOL) tablet 650 mg  650 mg Oral Q6H PRN Clapacs, John T, MD      . alum & mag hydroxide-simeth (MAALOX/MYLANTA) 200-200-20 MG/5ML suspension 30 mL  30 mL Oral Q4H PRN Clapacs, John T, MD      . aspirin EC tablet 81 mg  81 mg Oral Daily  Clapacs, Madie Reno, MD   81 mg at 07/09/17 0855  . clonazePAM (KLONOPIN) tablet 1 mg  1 mg Oral Q2000 Dylin Breeden B, MD   1 mg at 07/08/17 2005  . enalapril (VASOTEC) tablet 20 mg  20 mg Oral BID Clapacs, Madie Reno, MD   Stopped at 07/09/17 [phone]  . FLUoxetine (PROZAC) capsule 20 mg  20 mg Oral Daily Clapacs, Madie Reno, MD   20 mg at 07/09/17 0855  . fluticasone (FLONASE) 50 MCG/ACT nasal spray 2 spray  2 spray Each Nare Daily Kameran Lallier B, MD   2 spray at 07/09/17 0855  . glipiZIDE (GLUCOTROL XL) 24 hr tablet 5 mg  5 mg Oral BID WC Clapacs, Madie Reno, MD   5 mg at 07/09/17 0855  . levothyroxine (SYNTHROID, LEVOTHROID) tablet 75 mcg  75 mcg Oral Q0600 Maraki Macquarrie B, MD   75 mcg at 07/09/17 0616  . loratadine (CLARITIN) tablet 10 mg  10 mg Oral Daily Clapacs, Madie Reno, MD   10 mg at 07/09/17 0855  . magnesium hydroxide (MILK OF MAGNESIA) suspension 30 mL  30 mL Oral Daily PRN Clapacs, John T, MD      . olopatadine (PATANOL) 0.1 % ophthalmic solution 1 drop  1 drop Both Eyes Q0600 Vanassa Penniman B, MD   1 drop at 07/09/17 0617  . simvastatin (ZOCOR) tablet 20 mg  20 mg Oral q1800 Clapacs, Madie Reno, MD   20 mg at 07/08/17 1725  . traZODone (DESYREL) tablet 100 mg  100 mg Oral QHS Clapacs, Madie Reno, MD   100 mg at 07/08/17 2142    Lab Results:  Results for orders placed or performed during the hospital encounter of 07/07/17 (from the past 48 hour(s))  Glucose, capillary     Status: Abnormal   Collection Time: 07/07/17  4:30 PM  Result Value Ref Range   Glucose-Capillary 150 (H) 65 - 99 mg/dL   Comment 1 Notify RN   Hemoglobin A1c     Status: Abnormal   Collection Time: 07/08/17  6:50 AM  Result Value Ref Range   Hgb A1c MFr Bld 6.4 (H) 4.8 - 5.6 %    Comment: (NOTE) Pre diabetes:          5.7%-6.4% Diabetes:              >6.4% Glycemic control for   <7.0% adults with diabetes    Mean Plasma Glucose 136.98 mg/dL    Comment: Performed at [facility], [place] [address]  [place], [place] [address]  Lipid panel     Status: Abnormal   Collection Time: 07/08/17  6:50 AM  Result Value Ref Range   Cholesterol 178 0 - 200 mg/dL   Triglycerides 190 (H) <150 mg/dL   HDL 46 >40 mg/dL   Total CHOL/HDL Ratio 3.9  RATIO   VLDL 38 0 - 40 mg/dL   LDL Cholesterol 94 0 - 99 mg/dL    Comment:        Total Cholesterol/HDL:CHD Risk Coronary Heart Disease Risk Table                     Men   Women  1/2 Average Risk   3.4   3.3  Average Risk       5.0   4.4  2 X Average Risk   9.6   7.1  3 X Average Risk  23.4   11.0        Use the calculated Patient Ratio above and the CHD Risk Table to determine the patient's CHD Risk.        ATP III CLASSIFICATION (LDL):  <100     mg/dL   Optimal  100-129  mg/dL   Near or Above                    Optimal  130-159  mg/dL   Borderline  160-189  mg/dL   High  >190     mg/dL   Very High Performed at [facility], [address] [place], [place] [address]   TSH     Status: None   Collection Time: 07/08/17  6:50 AM  Result Value Ref Range   TSH 2.328 0.350 - 4.500 uIU/mL    Comment: Performed by a 3rd Generation assay with a functional sensitivity of <=0.01 uIU/mL. Performed at [facility], [address] [place], [place] [address]   Glucose, capillary     Status: Abnormal   Collection Time: 07/08/17  6:59 AM  Result Value Ref Range   Glucose-Capillary 119 (H) 65 - 99 mg/dL  Glucose, capillary     Status: Abnormal   Collection Time: 07/08/17 11:35 AM  Result Value Ref Range   Glucose-Capillary 135 (H) 65 - 99 mg/dL  Glucose, capillary     Status: Abnormal   Collection Time: 07/08/17  5:28 PM  Result Value Ref Range   Glucose-Capillary 164 (H) 65 - 99 mg/dL    Blood Alcohol level:  Lab Results  Component Value Date   ETH <10 07/07/2017   ETH <5 89/38/1017    Metabolic Disorder Labs: Lab Results  Component Value Date   HGBA1C 6.4 (H) 07/08/2017   MPG 136.98 07/08/2017   No results found for:  PROLACTIN Lab Results  Component Value Date   CHOL 178 07/08/2017   TRIG 190 (H) 07/08/2017   HDL 46 07/08/2017   CHOLHDL 3.9 07/08/2017   VLDL 38 07/08/2017   LDLCALC 94 07/08/2017   LDLCALC 69 03/26/2012    Physical Findings: AIMS: Facial and Oral Movements Muscles of Facial Expression: None, normal Lips and Perioral Area: None, normal Jaw: None, normal Tongue: None, normal,Extremity Movements Upper (arms, wrists, hands, fingers): None, normal Lower (legs, knees, ankles, toes): None, normal, Trunk Movements Neck, shoulders, hips: None, normal, Overall Severity Severity of abnormal movements (highest score from questions above): None, normal Incapacitation due to abnormal movements: None, normal Patient's awareness of abnormal movements (rate only patient's report): No Awareness, Dental Status Current problems with teeth and/or dentures?: Yes Does patient usually wear dentures?: Yes  CIWA:  CIWA-Ar Total: 0 COWS:  COWS Total Score: 0  Musculoskeletal: Strength & Muscle Tone: within normal limits Gait & Station: normal Patient leans: N/A  Psychiatric Specialty Exam: Physical Exam  Nursing note and vitals reviewed.  Psychiatric: Her affect is angry and labile. Her speech is rapid and/or pressured. She is hyperactive. Thought content is paranoid and delusional. Cognition and memory are normal. She expresses impulsivity.    Review of Systems  Psychiatric/Behavioral: The patient is nervous/anxious.   All other systems reviewed and are negative.   Blood pressure 110/64, pulse 74, temperature 98.3 F (36.8 C), temperature source Oral, resp. rate 18, height 5' 1.02" (1.55 m), weight 70.8 kg (156 lb), SpO2 97 %.Body mass index is 29.45 kg/m.  General Appearance: Casual  Eye Contact:  Good  Speech:  Clear and Coherent  Volume:  Normal  Mood:  Dysphoric  Affect:  Congruent  Thought Process:  Goal Directed and Descriptions of Associations: Intact  Orientation:  Full (Time,  Place, and Person)  Thought Content:  Delusions and Paranoid Ideation  Suicidal Thoughts:  No  Homicidal Thoughts:  No  Memory:  Immediate;   Fair Recent;   Fair Remote;   Fair  Judgement:  Poor  Insight:  Shallow  Psychomotor Activity:  Increased  Concentration:  Concentration: Fair and Attention Span: Fair  Recall:  [employer] of Knowledge:  Fair  Language:  Fair  Akathisia:  No  Handed:  Right  AIMS (if indicated):     Assets:  Communication Skills Desire for Improvement Financial Resources/Insurance Housing Physical Health Resilience  ADL's:  Intact  Cognition:  WNL  Sleep:  Number of Hours: 7.75     Treatment Plan Summary: Daily contact with patient to assess and evaluate symptoms and progress in treatment and Medication management   Ms. Angello is a 72 year old female with a history of bipolar disorder admitted for anxious distress.  #Mood -started Prozac 20 mg that was helpful in the past -refuses mood stabilizer -Trazodone 100 mg nightly  #Anxiety -Vistaril 25 mg PRN -lower Klonopin to 0.5 mg at 20:00 -consider Minipress  #Hypothyroidism -Synthroid 75 mcg daily  #HTN -Enalapril 20 mg BID  #Diabetes -Glipizide 5 mg daily -ADA diet -BS checks BID  #Dyslipidemia -Zocor 20 mg daily  #Metabolic syndrome monitoring -Lipid panel, TSH and HgbA1C are pending -EKG, pending  #Disposition -discharge to her apartment -follow up with [facility] -follow up with Dr. Berdine Dance, MD 07/09/2017, 1:56 PM

## 2017-07-09 NOTE — Progress Notes (Signed)
Patient is doing well in the unit and interacting with peers without any issues , takes her medicines and sleep long hours without any interruptions , denies any SI/HI, contract for safety of self  And others, no respiratory distress.

## 2017-07-09 NOTE — Plan of Care (Signed)
  Progressing Activity: Interest or engagement in activities will improve 07/09/2017 1940 - Progressing by Clemens Catholic, RN Sleeping patterns will improve 07/09/2017 1940 - Progressing by Clemens Catholic, RN Education: Knowledge of [place] Education information/materials will improve 07/09/2017 1940 - Progressing by Clemens Catholic, RN Mental status will improve 07/09/2017 1940 - Progressing by Clemens Catholic, RN Safety: Periods of time without injury will increase 07/09/2017 1940 - Progressing by Clemens Catholic, RN Education: Will be free of psychotic symptoms 07/09/2017 1940 - Progressing by Clemens Catholic, RN

## 2017-07-10 LAB — GLUCOSE, CAPILLARY
Glucose-Capillary: 163 mg/dL — ABNORMAL HIGH (ref 65–99)
Glucose-Capillary: 165 mg/dL — ABNORMAL HIGH (ref 65–99)
Glucose-Capillary: 86 mg/dL (ref 65–99)

## 2017-07-10 MED ORDER — FLUOXETINE HCL 20 MG PO CAPS: 20.0000 mg | ORAL_CAPSULE | Freq: Every day | ORAL | 3 refills | Status: DC

## 2017-07-10 NOTE — BHH Suicide Risk Assessment (Signed)
[facility] Discharge Suicide Risk Assessment   Principal Problem: Bipolar I disorder, most recent episode depressed with anxious distress [facility]) Discharge Diagnoses:  Patient Active Problem List   Diagnosis Date Noted  . Bipolar I disorder, most recent episode depressed with anxious distress ([place]) [F31.9] 07/07/2017    Priority: High  . Bipolar I disorder, current or most recent episode manic, severe with mixed features ([place]) [F31.13] 09/20/2014    Priority: High  . PTSD (post-traumatic stress disorder) [F43.10] 07/07/2017  . Adjustment disorder with mixed disturbance of emotions and conduct [F43.25] 10/11/2016  . Diabetes ([place]) [E11.9] 09/23/2014  . Essential hypertension [I10] 09/21/2014  . Hypothyroidism [E03.9] 09/21/2014  . Dyslipidemia [E78.5] 09/21/2014    Total Time spent with patient: 35  Musculoskeletal: Strength & Muscle Tone: within normal limits Gait & Station: normal Patient leans: N/A  Psychiatric Specialty Exam: Review of Systems  Neurological: Negative.   Psychiatric/Behavioral: Negative.   All other systems reviewed and are negative.   Blood pressure 116/72, pulse 70, temperature 98.3 F (36.8 C), temperature source Oral, resp. rate 18, height 5' 1.02" (1.55 m), weight 70.8 kg (156 lb), SpO2 97 %.Body mass index is 29.45 kg/m.  General Appearance: Casual  Eye Contact::  Good  Speech:  Clear and Coherent409  Volume:  Normal  Mood:  Euthymic  Affect:  Appropriate  Thought Process:  Goal Directed and Descriptions of Associations: Intact  Orientation:  Full (Time, Place, and Person)  Thought Content:  WDL  Suicidal Thoughts:  No  Homicidal Thoughts:  No  Memory:  Immediate;   Fair Recent;   Fair Remote;   Fair  Judgement:  Fair  Insight:  Fair  Psychomotor Activity:  Normal  Concentration:  Fair  Recall:  [employer] of Knowledge:Good  Language: Fair  Akathisia:  No  Handed:  Right  AIMS (if indicated):     Assets:  Communication Skills Desire for  Improvement Financial Resources/Insurance Housing Physical Health Resilience Social Support  Sleep:  Number of Hours: 6.45  Cognition: WNL  ADL's:  Intact   Mental Status Per Nursing Assessment::   On Admission:  NA  Demographic Factors:  Divorced or widowed, Caucasian and Living alone  Loss Factors: NA  Historical Factors: Prior suicide attempts, Family history of mental illness or substance abuse and Impulsivity  Risk Reduction Factors:   Positive therapeutic relationship  Continued Clinical Symptoms:  Depression:   Impulsivity  Cognitive Features That Contribute To Risk:  None    Suicide Risk:  Minimal: No identifiable suicidal ideation.  Patients presenting with no risk factors but with morbid ruminations; may be classified as minimal risk based on the severity of the depressive symptoms  Follow-up Information    [facility] on 07/11/2017.   Why:  Please meet with your peer support tomorrow Feburary 22, 2019 at 12pm for Peer Support services. Thank you.  Contact information: [place] [address] [phone]           Plan Of Care/Follow-up recommendations:  Activity:  as tolerated Diet:  low sodium heart healthy ADA diet Other:  keep follow up appointments  Orson Slick, MD 07/10/2017, 2:17 PM

## 2017-07-10 NOTE — Progress Notes (Signed)
  [facility] Adult Case Management Discharge Plan :  Will you be returning to the same living situation after discharge:  Yes,  Home At discharge, do you have transportation home?: Yes,  [employer] Do you have the ability to pay for your medications: Yes,  Insurance  Release of information consent forms completed and in the chart;  Patient's signature needed at discharge.  Patient to Follow up at: Follow-up Information    [facility] on 07/11/2017.   Why:  Please meet with your peer support tomorrow Feburary 22, 2019 at 12pm for Peer Support services. Thank you.  Contact information: [place] [address] [phone]           Next level of care provider has access to [place] and Suicide Prevention discussed: Yes,  with patient. Patient refused any family contact  Have you used any form of tobacco in the last 30 days? (Cigarettes, Smokeless Tobacco, Cigars, and/or Pipes): No  Has patient been referred to the Quitline?: N/A patient is not a smoker  Patient has been referred for addiction treatment: N/A  Darin Engels, LCSW 07/10/2017, 2:25 PM

## 2017-07-10 NOTE — Progress Notes (Signed)
Patient has improved in so ways, socially , and her assertiveness has improved, aware of her medical regimen and interacting well with peers, no complain  As related to her anxiety and depression, denies any SI/HI . she contract for safety of self and others, patient is sleeping long hours without any interactions  no respiratory distress noted.
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
## 2017-07-10 NOTE — Progress Notes (Signed)
Patient discharged on above date and time. Transported home via cab, Rx's, personal items and discharge paperwork in hand. Verbally stated that she understood discharge instructions. Denies SI/HI/AVH and pain.

## 2017-07-10 NOTE — Plan of Care (Signed)
Patient up ad lib with steady gait, speech clear. Reports that she slept good last night. Appetite good, energy level normal and concentration is good. Rates depression, anxiety and pain (after medication) at a 0. Compliant with medications and meals. Attends group with active participation. Patient states that she is working on her discharge planning. Milieu remains safe with q 15 minute safety checks. Will continue to monitor.

## 2017-07-10 NOTE — B[facility] Group Notes (Signed)
07/10/2017  Time: 1:00PM  Type of Therapy/Topic:  Group Therapy:  Balance in Life  Participation Level:  Active  Description of Group:   This group will address the concept of balance and how it feels and looks when one is unbalanced. Patients will be encouraged to process areas in their lives that are out of balance and identify reasons for remaining unbalanced. Facilitators will guide patients in utilizing problem-solving interventions to address and correct the stressor making their life unbalanced. Understanding and applying boundaries will be explored and addressed for obtaining and maintaining a balanced life. Patients will be encouraged to explore ways to assertively make their unbalanced needs known to significant others in their lives, using other group members and facilitator for support and feedback.  Therapeutic Goals: 1. Patient will identify two or more emotions or situations they have that consume much of in their lives. 2. Patient will identify signs/triggers that life has become out of balance:  3. Patient will identify two ways to set boundaries in order to achieve balance in their lives:  4. Patient will demonstrate ability to communicate their needs through discussion and/or role plays  Summary of Patient Progress: Pt continues to work towards their tx goals but has not yet reached them. Pt was able to appropriately participate in group discussion, and was able to offer support/validation to other group members. Pt reported feeling, "a lot better. I'm feeling well enough that I want to talk to my doctor about going home later." Pt reported she feels her life is perfectly in balance, "I do what I want to do and I'm perfectly content." Pt reported she does not feel she devotes too much attention to any one specific thing or area in her life.  Therapeutic Modalities:   Cognitive Behavioral Therapy Solution-Focused Therapy Assertiveness Training  Alden Hipp, MSW,  LCSW 07/10/2017 1:51 PM

## 2017-07-10 NOTE — BHH Group Notes (Signed)
  07/10/2017 9am  Type of Therapy and Topic: Group Therapy: Goals Group: SMART Goals   Participation Level: Active  Description of Group:    The purpose of a daily goals group is to assist and guide patients in setting recovery/wellness-related goals. The objective is to set goals as they relate to the crisis in which they were admitted. Patients will be using SMART goal modalities to set measurable goals. Characteristics of realistic goals will be discussed and patients will be assisted in setting and processing how one will reach their goal. Facilitator will also assist patients in applying interventions and coping skills learned in psycho-education groups to the SMART goal and process how one will achieve defined goal.   Therapeutic Goals:   -Patients will develop and document one goal related to or their crisis in which brought them into treatment.  -Patients will be guided by LCSW using SMART goal setting modality in how to set a measurable, attainable, realistic and time sensitive goal.  -Patients will process barriers in reaching goal.  -Patients will process interventions in how to overcome and successful in reaching goal.   Patient's Goal: "To discuss discharge with the doctor today."  Therapeutic Modalities:  Motivational Interviewing  Cognitive Behavioral Therapy  Crisis Intervention Model  SMART goals setting  Darin Engels, Marlinda Mike 07/10/2017 9:38 AM

## 2017-08-05 ENCOUNTER — Other Ambulatory Visit: Payer: Self-pay | Admitting: Internal Medicine

## 2017-08-05 ENCOUNTER — Ambulatory Visit
Admission: RE | Admit: 2017-08-05 | Discharge: 2017-08-05 | Disposition: A | Discharge status: Home / Self Care | Payer: Medicare Other | Source: Ambulatory Visit | Attending: Internal Medicine | Admitting: Internal Medicine

## 2017-08-05 DIAGNOSIS — Z1231 Encounter for screening mammogram for malignant neoplasm of breast: Secondary | ICD-10-CM | POA: Diagnosis not present

## 2017-08-05 IMAGING — MG MM DIGITAL SCREENING BILAT W/ TOMO W/ CAD
8 of 15 series · 8 of 31 positions shown · non-contrast
Comparison: Previous exam(s).

CLINICAL DATA: Screening.

EXAM:
DIGITAL SCREENING BILATERAL MAMMOGRAM WITH TOMO AND CAD

[L CC (1 of 2)]
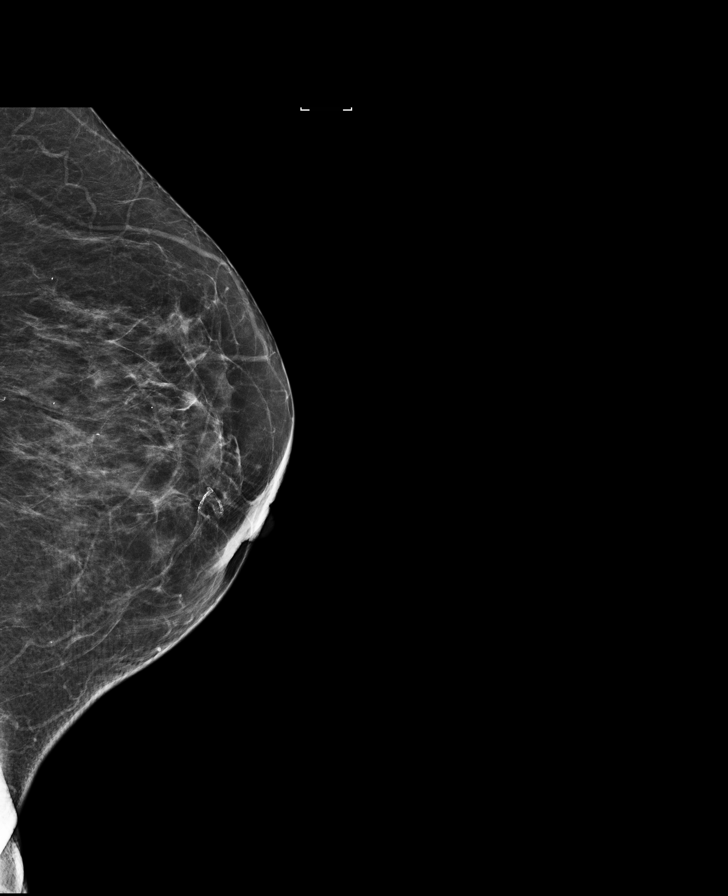

[L MLO]
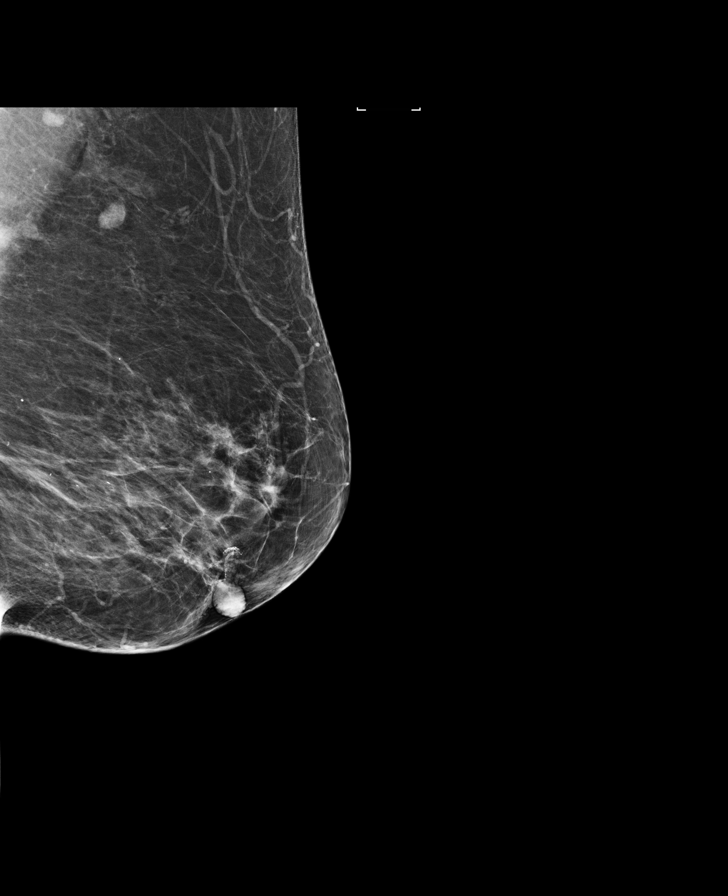

[L CC synth-2D]
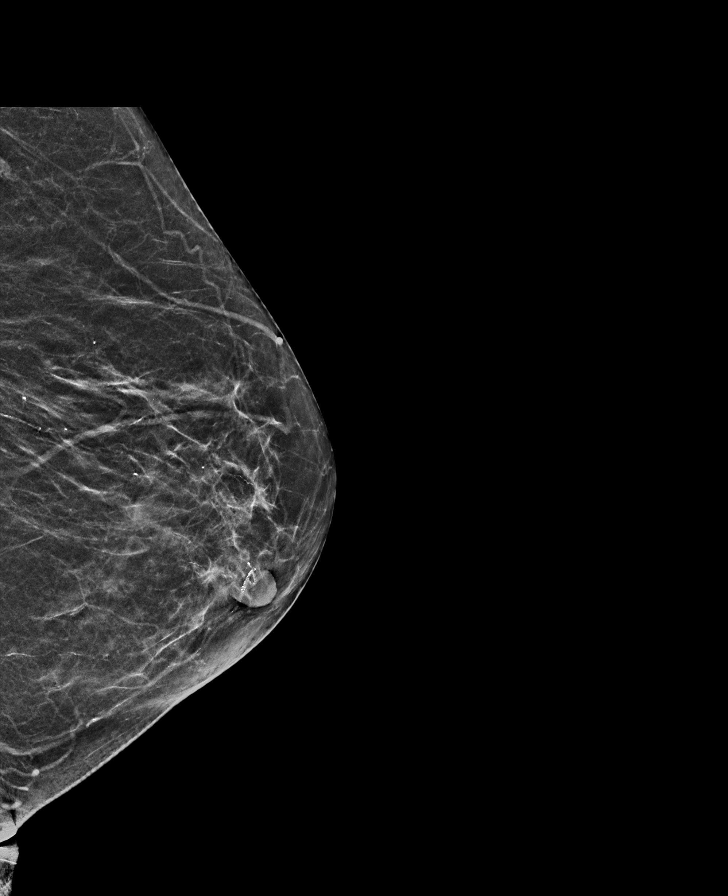

[L MLO synth-2D]
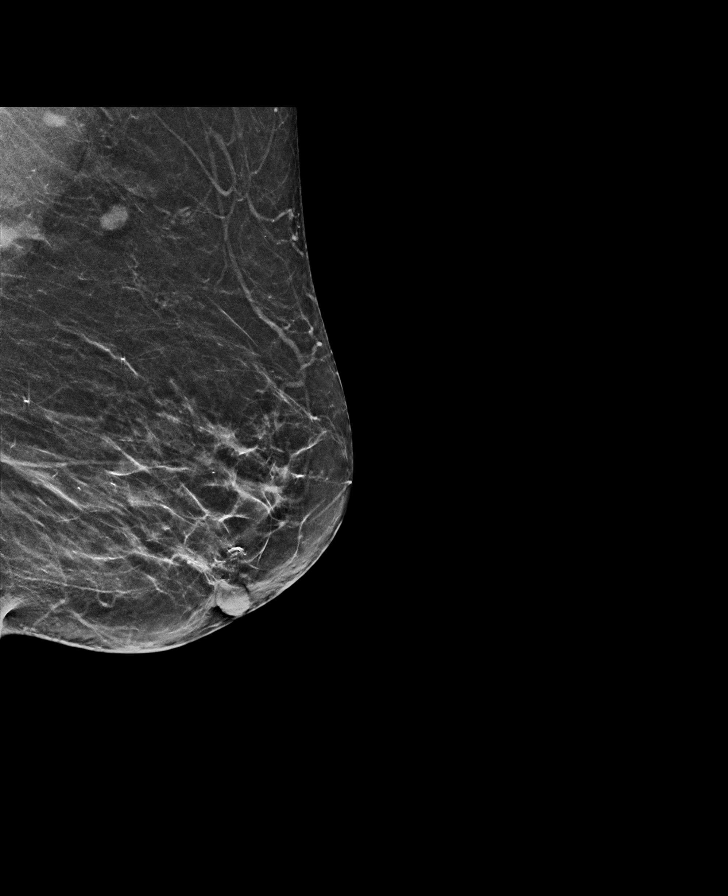

[R CC]
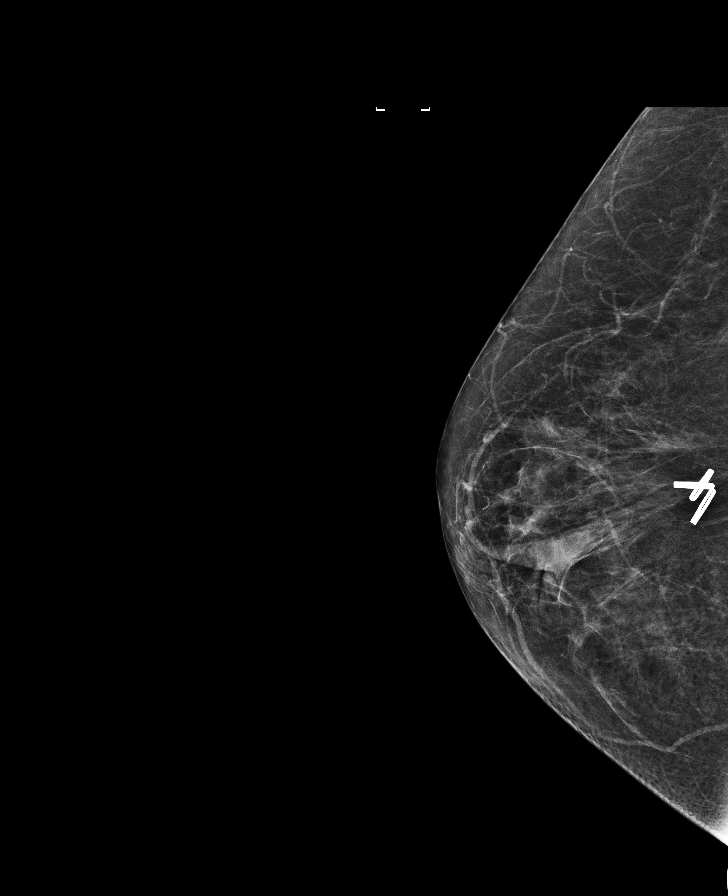

[R MLO]
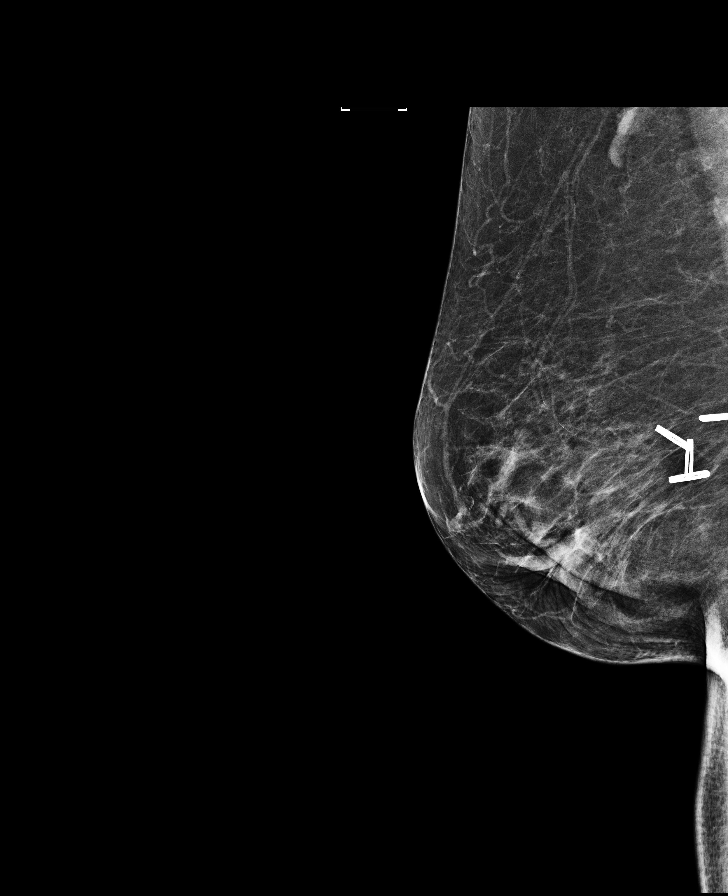

[L CC (2 of 2)]
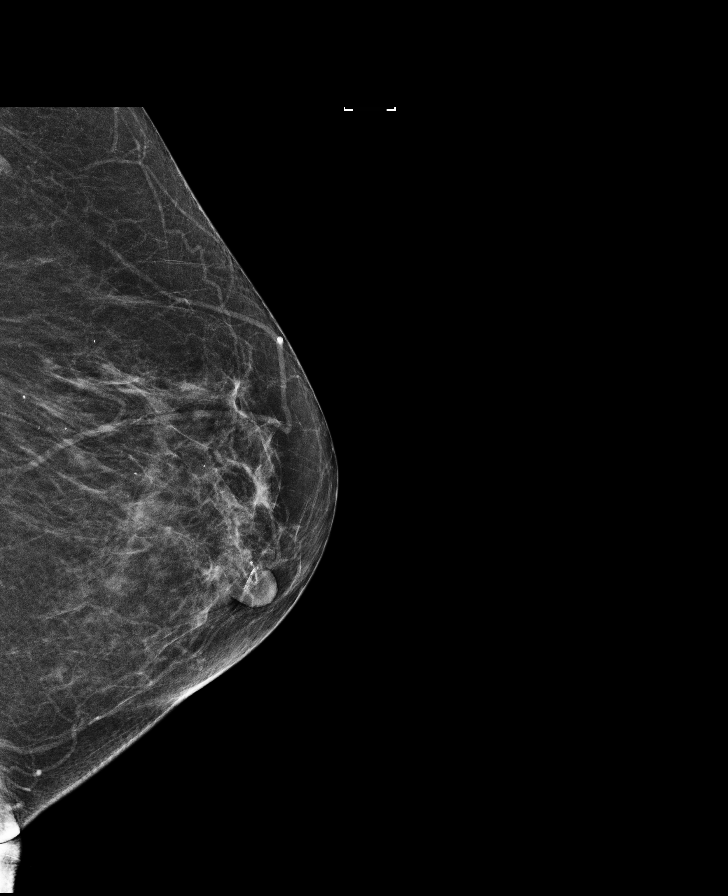

[R CC synth-2D]
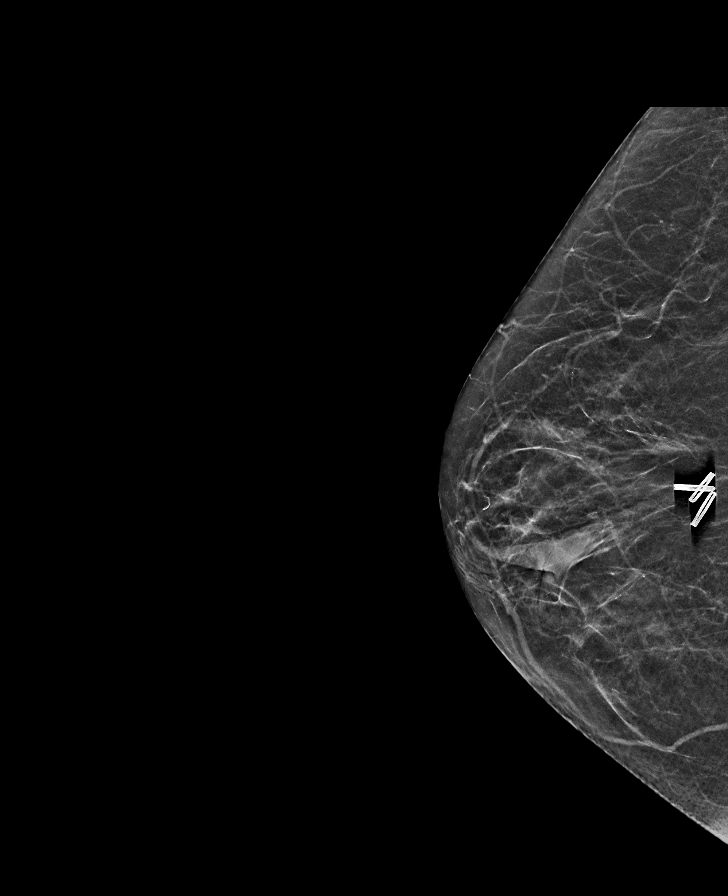

[8 of 31 positions shown; findings below may reference images not displayed]

ACR Breast Density Category b: There are scattered areas of
fibroglandular density.
FINDINGS: In the left breast, a possible asymmetry warrants further
evaluation. In the right breast, no findings suspicious for
malignancy. Images were processed with CAD.
IMPRESSION: Further evaluation is suggested for possible asymmetry in the left
breast.

RECOMMENDATION:
Diagnostic mammogram and possibly ultrasound of the left breast.
(Code:[XJ])

The patient will be contacted regarding the findings, and additional
imaging will be scheduled.

BI-RADS CATEGORY  0: Incomplete. Need additional imaging evaluation
and/or prior mammograms for comparison.

## 2017-08-12 ENCOUNTER — Other Ambulatory Visit: Payer: Self-pay | Admitting: Internal Medicine

## 2017-08-12 DIAGNOSIS — N6489 Other specified disorders of breast: Secondary | ICD-10-CM

## 2017-08-12 DIAGNOSIS — R928 Other abnormal and inconclusive findings on diagnostic imaging of breast: Secondary | ICD-10-CM

## 2017-08-25 ENCOUNTER — Ambulatory Visit
Admission: RE | Admit: 2017-08-25 | Discharge: 2017-08-25 | Disposition: A | Discharge status: Home / Self Care | Payer: Medicare Other | Source: Ambulatory Visit | Attending: Internal Medicine | Admitting: Internal Medicine

## 2017-08-25 DIAGNOSIS — N6489 Other specified disorders of breast: Secondary | ICD-10-CM

## 2017-08-25 DIAGNOSIS — R928 Other abnormal and inconclusive findings on diagnostic imaging of breast: Secondary | ICD-10-CM | POA: Diagnosis not present

## 2017-08-25 IMAGING — US US BREAST*L* LIMITED INC AXILLA
1 series · 3 of 3 positions shown · non-contrast
Comparison: Previous exam(s).

CLINICAL DATA: Left breast focal asymmetry seen on most recent
screening mammography. History of treated right breast cancer.

EXAM:
DIGITAL DIAGNOSTIC LEFT MAMMOGRAM WITH CAD AND TOMO
ULTRASOUND LEFT BREAST

[Series 1: us breast*left* limited inc axilla · 0.08mm/px · 3 of 3 slices shown]
[im 1/3]
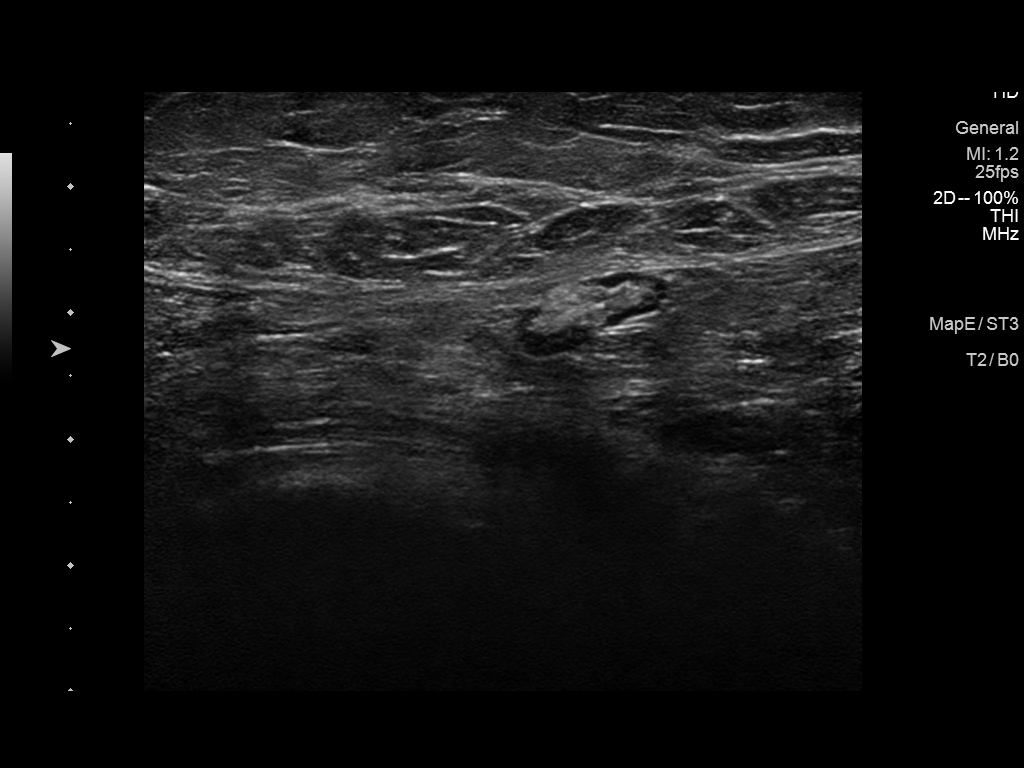
[im 2/3]
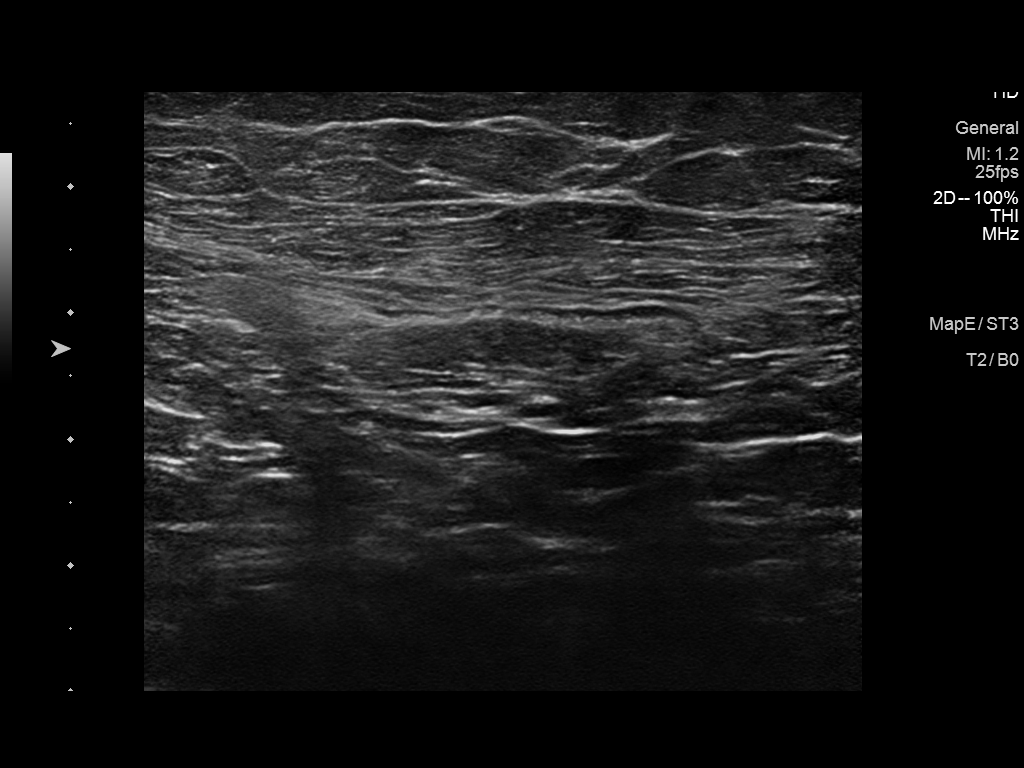
[im 3/3]
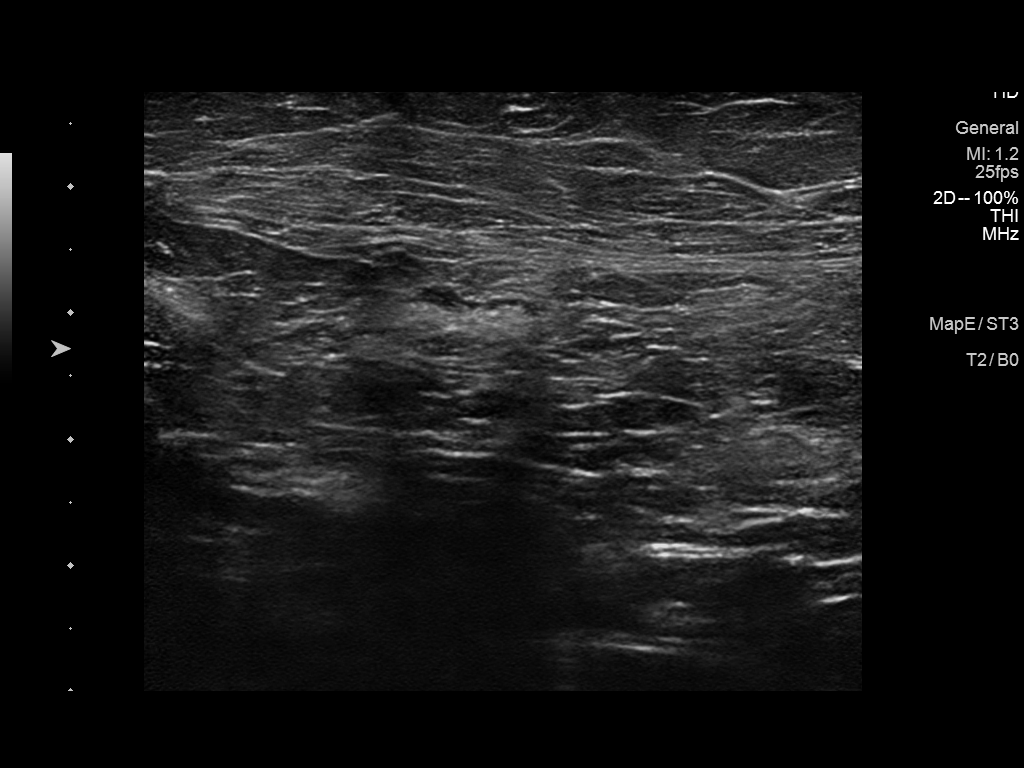

[3 of 3 positions shown; findings below may reference images not displayed]

ACR Breast Density Category b: There are scattered areas of
fibroglandular density.
FINDINGS: Mammographically, there are no suspicious masses or areas of
architectural distortion. Several low left axillary lymph nodes are
noted.

Mammographic images were processed with CAD.

On physical exam, no suspicious masses are palpated.

Targeted ultrasound is performed, showing no suspicious masses or
shadowing lesions. Several benign-appearing lymph nodes are noted.
The mammographically seen asymmetry likely corresponds to 1 of the
lymph nodes.
IMPRESSION: No mammographic or sonographic evidence of malignancy in the left
breast.

RECOMMENDATION:
Screening mammogram in one year.(Code:[JS])

I have discussed the findings and recommendations with the patient.
Results were also provided in writing at the conclusion of the
visit. If applicable, a reminder letter will be sent to the patient
regarding the next appointment.

BI-RADS CATEGORY  2: Benign.

## 2017-08-25 IMAGING — MG MM DIGITAL DIAGNOSTIC UNILAT*L* W/ TOMO W/ CAD
8 of 12 series · 8 of 28 positions shown · non-contrast
Comparison: Previous exam(s).

CLINICAL DATA: Left breast focal asymmetry seen on most recent
screening mammography. History of treated right breast cancer.

EXAM:
DIGITAL DIAGNOSTIC LEFT MAMMOGRAM WITH CAD AND TOMO
ULTRASOUND LEFT BREAST

[L XCCL synth-2D]
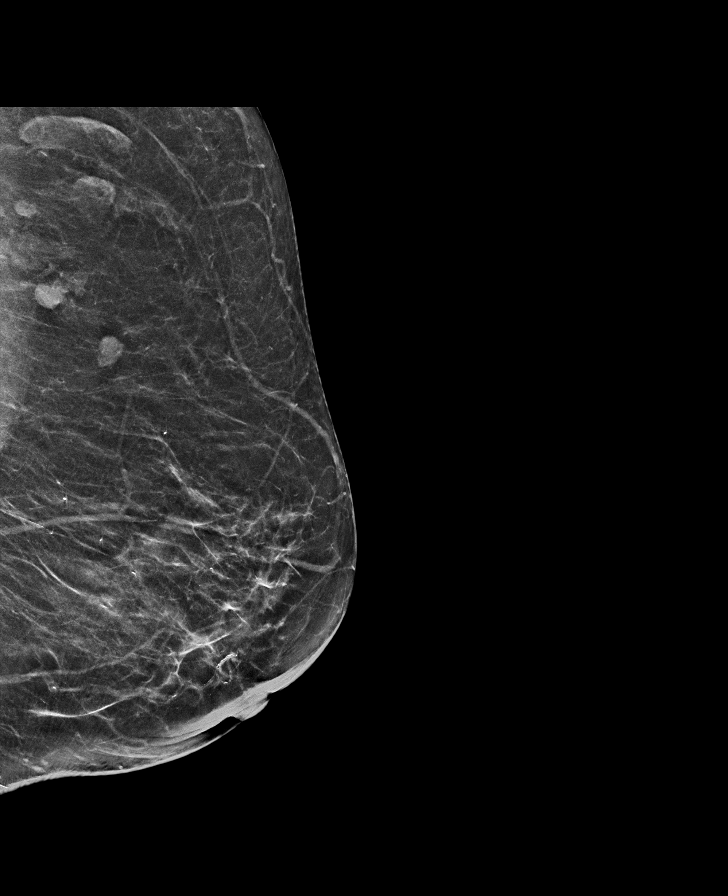

[L CC synth-2D]
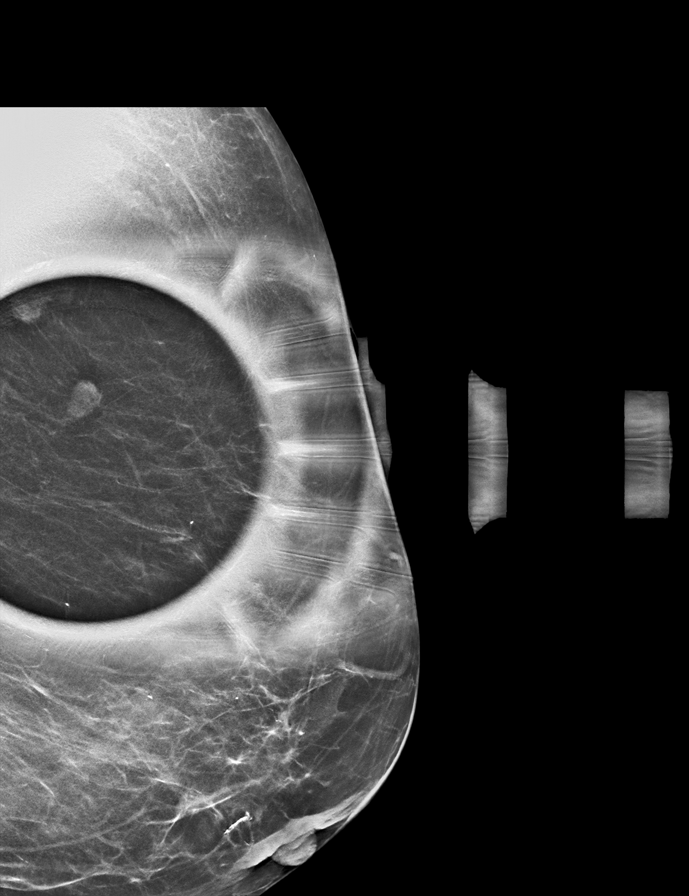

[L MLO synth-2D (1 of 2)]
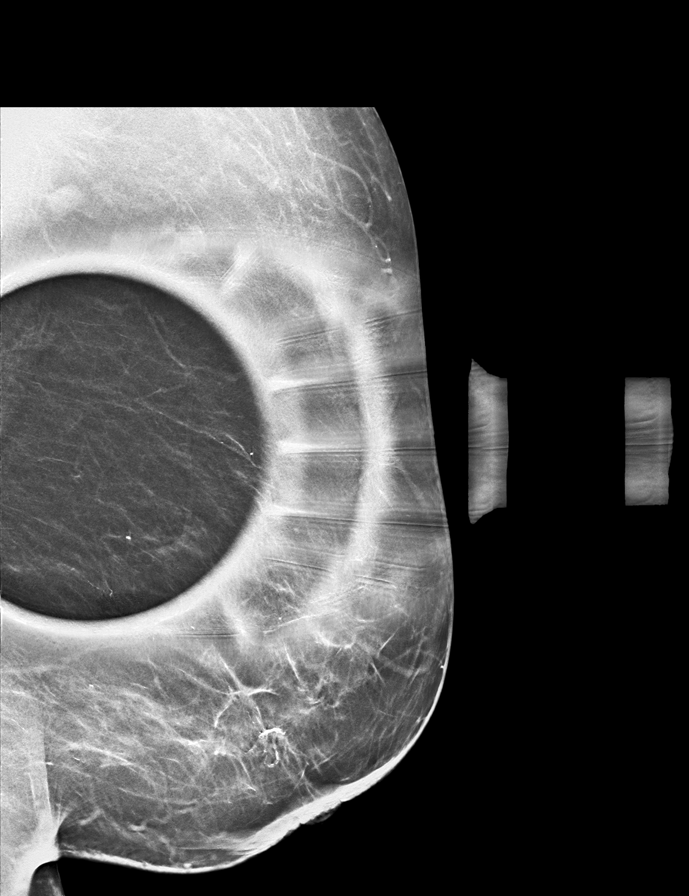

[L MLO synth-2D (2 of 2)]
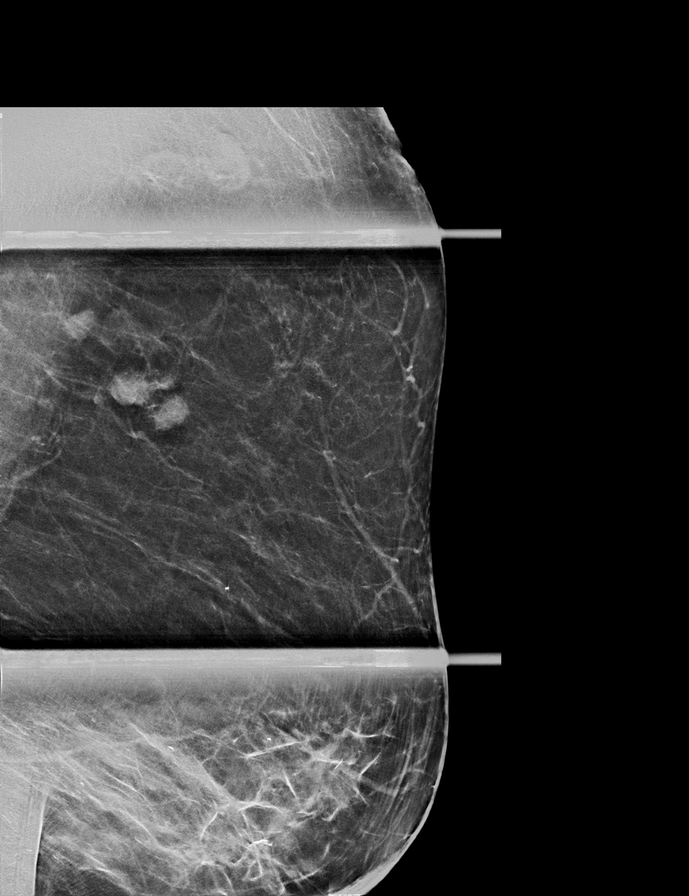

[L MLO (1 of 2)]
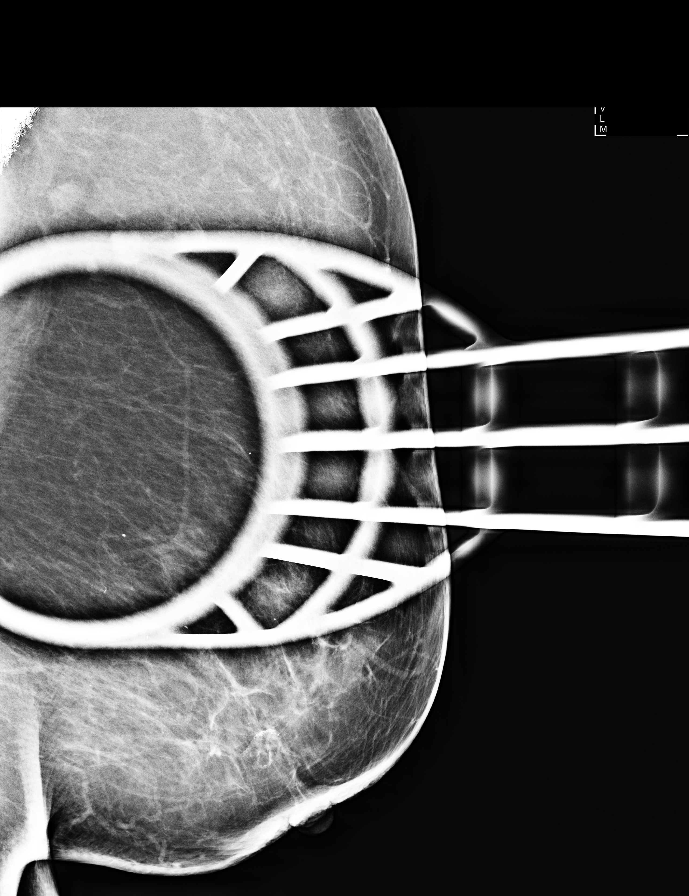

[L CC]
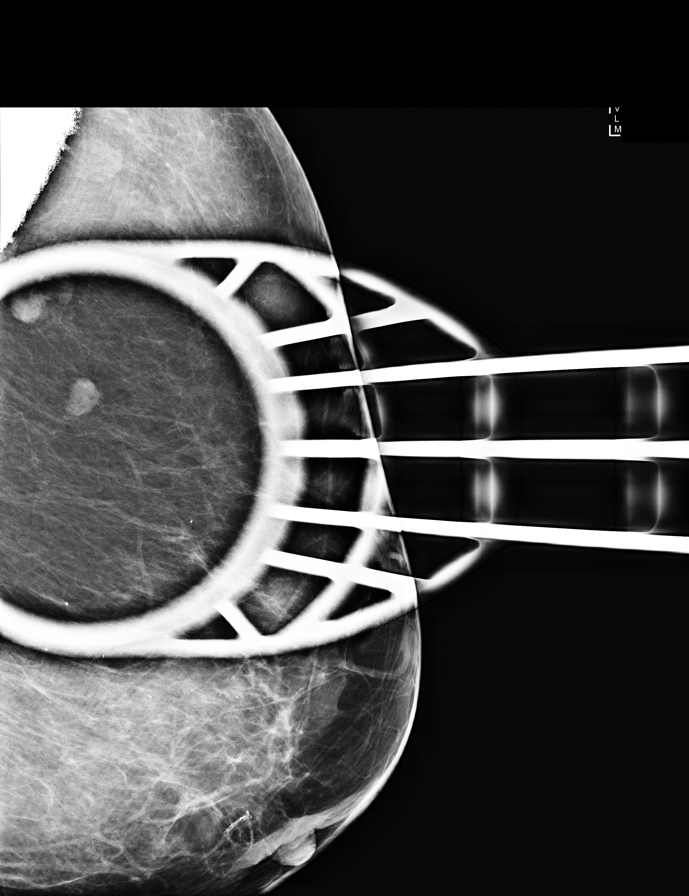

[L XCCL]
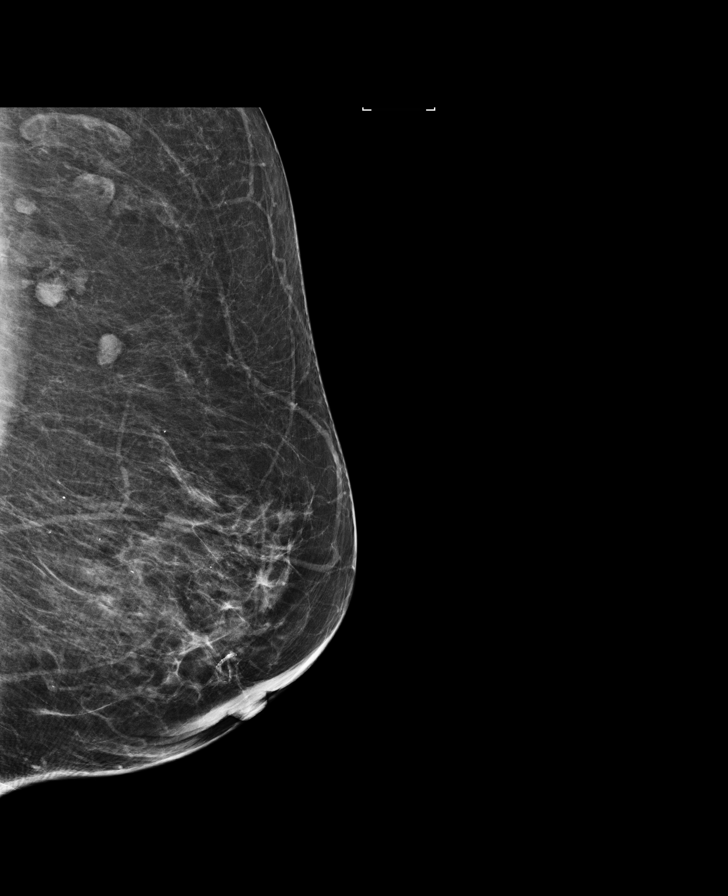

[L MLO (2 of 2)]
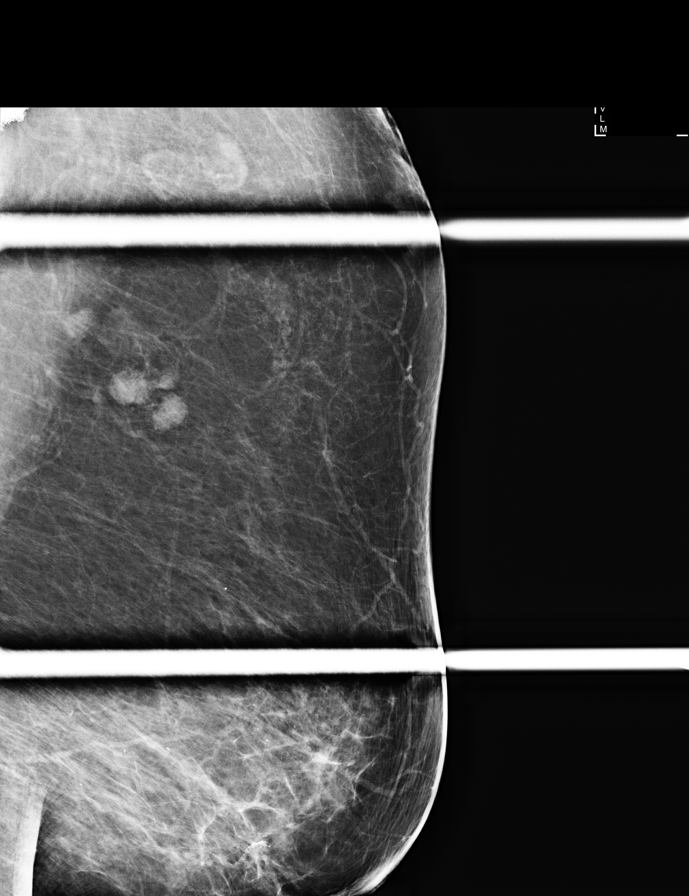

[8 of 28 positions shown; findings below may reference images not displayed]

ACR Breast Density Category b: There are scattered areas of
fibroglandular density.
FINDINGS: Mammographically, there are no suspicious masses or areas of
architectural distortion. Several low left axillary lymph nodes are
noted.

Mammographic images were processed with CAD.

On physical exam, no suspicious masses are palpated.

Targeted ultrasound is performed, showing no suspicious masses or
shadowing lesions. Several benign-appearing lymph nodes are noted.
The mammographically seen asymmetry likely corresponds to 1 of the
lymph nodes.
IMPRESSION: No mammographic or sonographic evidence of malignancy in the left
breast.

RECOMMENDATION:
Screening mammogram in one year.(Code:[JS])

I have discussed the findings and recommendations with the patient.
Results were also provided in writing at the conclusion of the
visit. If applicable, a reminder letter will be sent to the patient
regarding the next appointment.

BI-RADS CATEGORY  2: Benign.

## 2017-10-06 ENCOUNTER — Emergency Department: Payer: Medicare Other

## 2017-10-06 ENCOUNTER — Emergency Department
Admission: EM | Admit: 2017-10-06 | Discharge: 2017-10-06 | Disposition: A | Discharge status: Home / Self Care | Payer: Medicare Other | Attending: Student in an Organized Health Care Education/Training Program | Admitting: Student in an Organized Health Care Education/Training Program

## 2017-10-06 ENCOUNTER — Other Ambulatory Visit: Payer: Self-pay

## 2017-10-06 DIAGNOSIS — I10 Essential (primary) hypertension: Secondary | ICD-10-CM | POA: Diagnosis not present

## 2017-10-06 DIAGNOSIS — Z7982 Long term (current) use of aspirin: Secondary | ICD-10-CM | POA: Insufficient documentation

## 2017-10-06 DIAGNOSIS — E039 Hypothyroidism, unspecified: Secondary | ICD-10-CM | POA: Diagnosis not present

## 2017-10-06 DIAGNOSIS — Z79899 Other long term (current) drug therapy: Secondary | ICD-10-CM | POA: Insufficient documentation

## 2017-10-06 DIAGNOSIS — Z9049 Acquired absence of other specified parts of digestive tract: Secondary | ICD-10-CM | POA: Diagnosis not present

## 2017-10-06 DIAGNOSIS — M79604 Pain in right leg: Secondary | ICD-10-CM | POA: Diagnosis present

## 2017-10-06 DIAGNOSIS — M25561 Pain in right knee: Secondary | ICD-10-CM | POA: Diagnosis not present

## 2017-10-06 DIAGNOSIS — E119 Type 2 diabetes mellitus without complications: Secondary | ICD-10-CM | POA: Diagnosis not present

## 2017-10-06 DIAGNOSIS — Z87891 Personal history of nicotine dependence: Secondary | ICD-10-CM | POA: Diagnosis not present

## 2017-10-06 DIAGNOSIS — F319 Bipolar disorder, unspecified: Secondary | ICD-10-CM | POA: Insufficient documentation

## 2017-10-06 DIAGNOSIS — Z7984 Long term (current) use of oral hypoglycemic drugs: Secondary | ICD-10-CM | POA: Insufficient documentation

## 2017-10-06 DIAGNOSIS — Z853 Personal history of malignant neoplasm of breast: Secondary | ICD-10-CM | POA: Insufficient documentation

## 2017-10-06 IMAGING — CR DG HIP (WITH OR WITHOUT PELVIS) 2-3V*R*
1 series · 3 of 3 positions shown · non-contrast
Comparison: None.

CLINICAL DATA: Right lower extremity pain times 10 days with
difficulty weight-bearing.

EXAM:
DG HIP (WITH OR WITHOUT PELVIS) 2-3V RIGHT

[Series 1: dg hip unilat w or w/o pelvis 2-3 views  · non-contrast · 0.14mm/px · 3 of 3 slices shown]
[im 1/3]
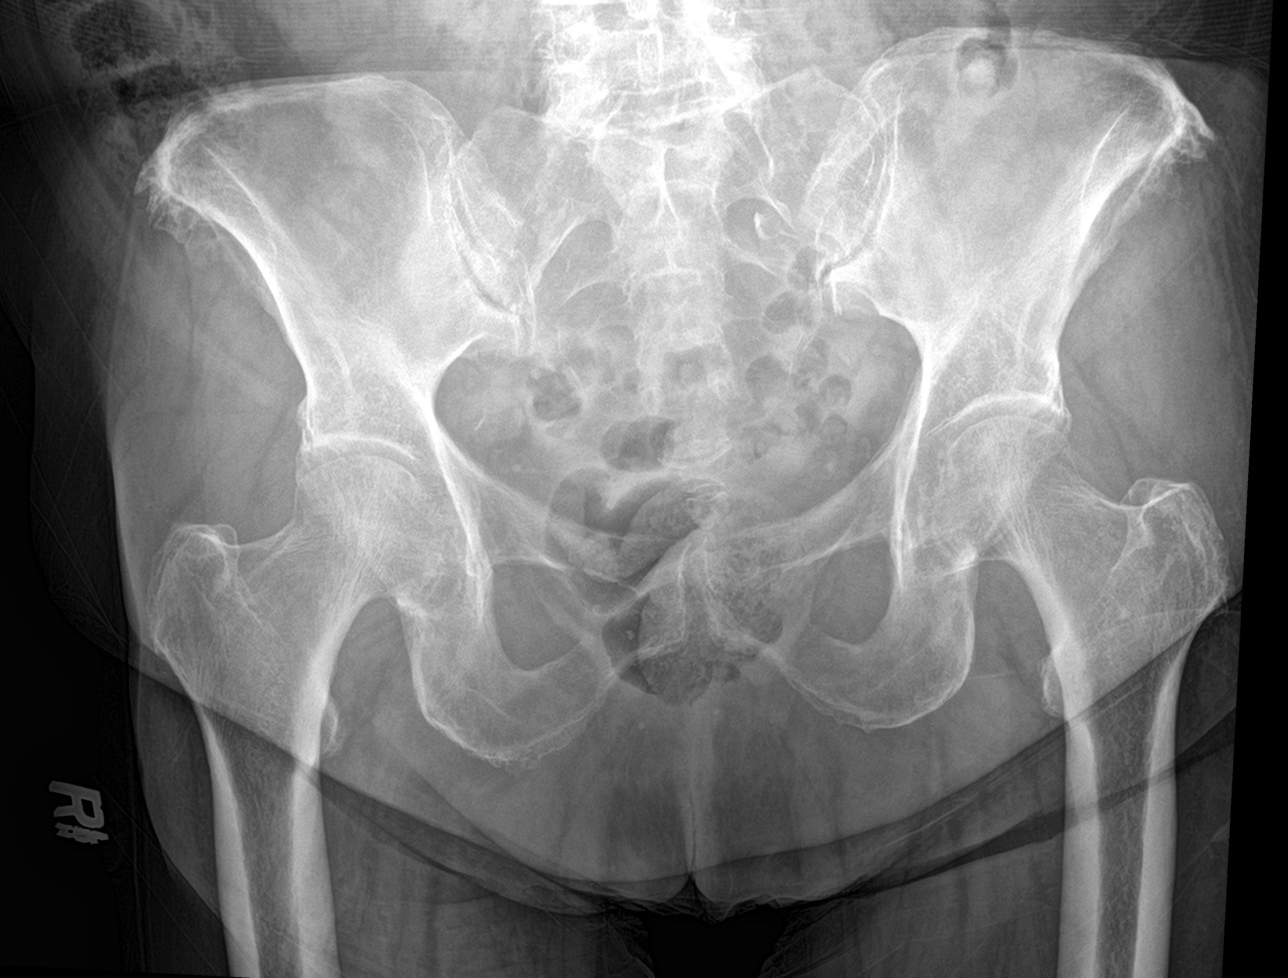
[im 2/3]
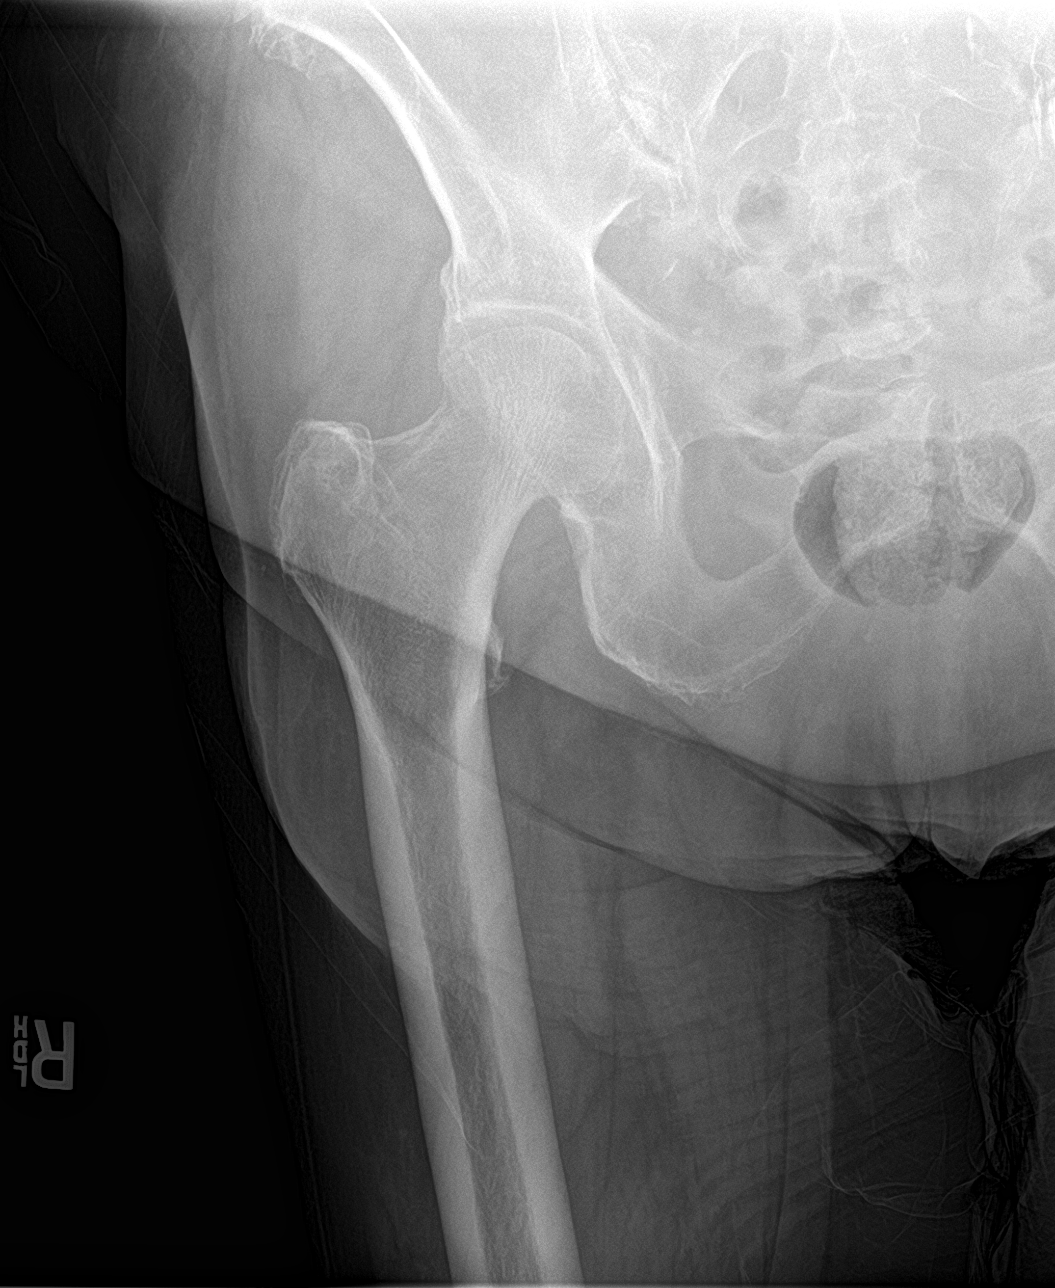
[im 3/3]
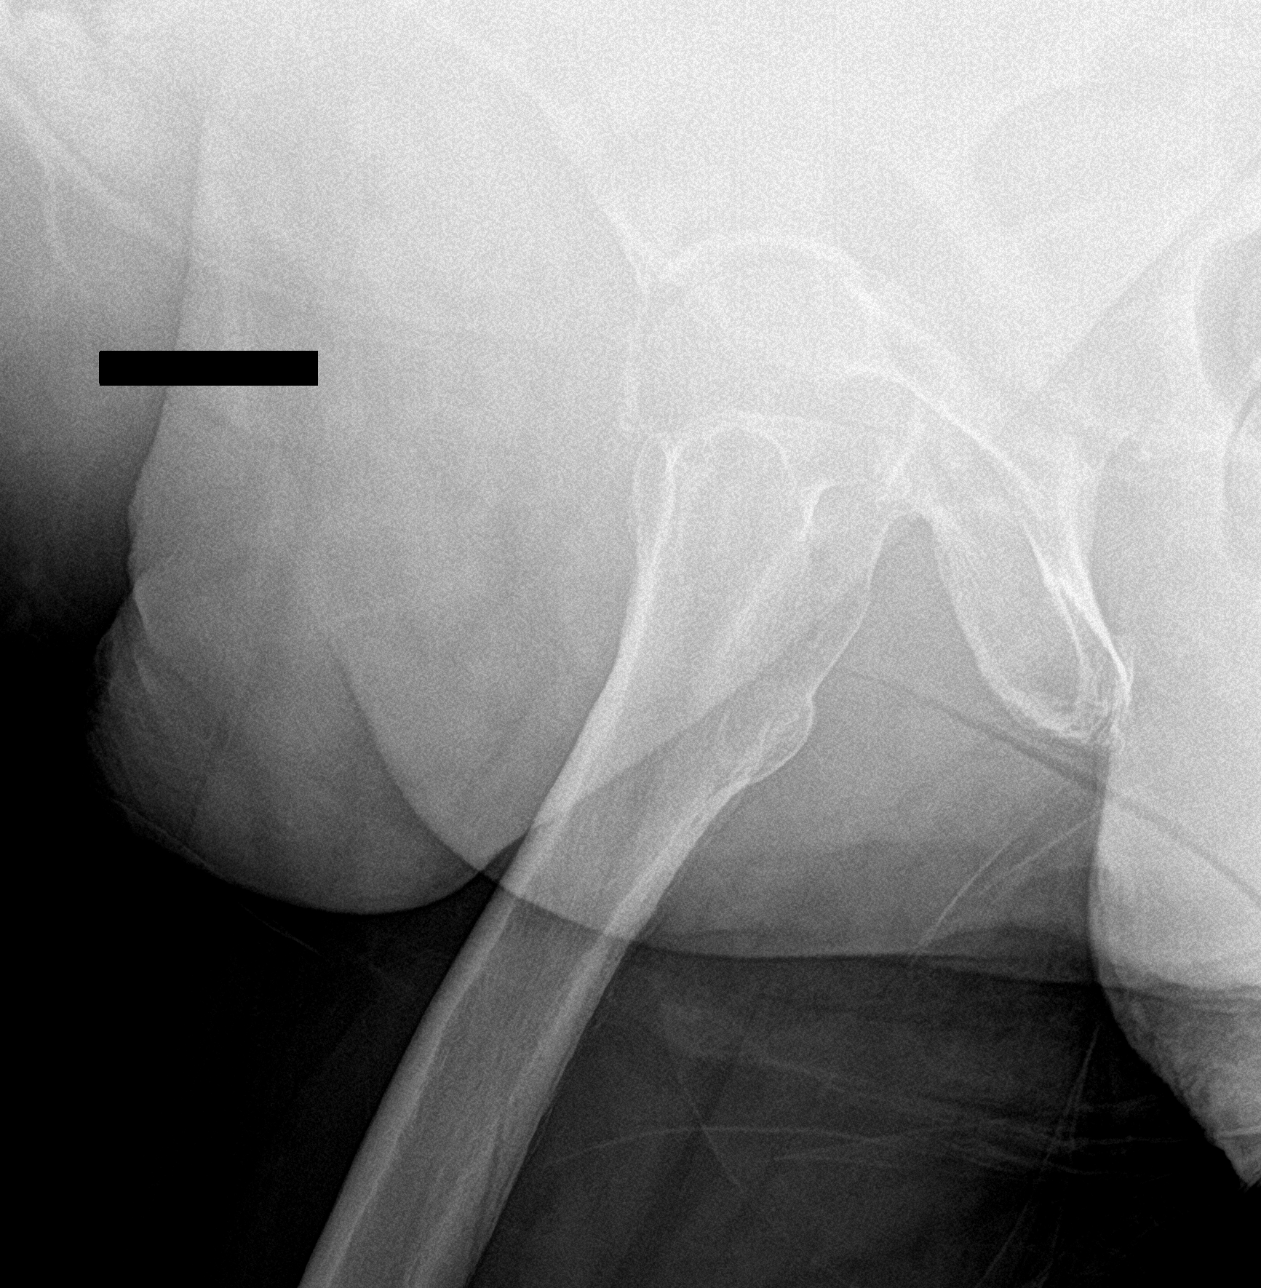

[3 of 3 positions shown; findings below may reference images not displayed]

FINDINGS: There is suggestion of lower lumbar facet arthropathy of the
included lumbar spine at the lumbosacral junction. The bony pelvis
appears intact with minimal enthesopathy off the iliac spines
bilaterally. No diastasis of the bony pelvis. Hip joints are
maintained without femoral head flattening. No fracture or joint
dislocations. Nutrient foramen seen on the oblique view of the
proximal right femur simulating a fracture.
IMPRESSION: No acute osseous abnormality of the pelvis and right hip.

## 2017-10-06 MED ORDER — TRAMADOL HCL 50 MG PO TABS: 50.0000 mg | ORAL_TABLET | Freq: Four times a day (QID) | ORAL | 0 refills | Status: DC | PRN

## 2017-10-06 MED ORDER — LIDOCAINE 5 % EX PTCH
1.0000 | MEDICATED_PATCH | CUTANEOUS | Status: DC
  Administered 2017-10-06: 1 via TRANSDERMAL

## 2017-10-06 MED ORDER — TRAMADOL HCL 50 MG PO TABS
50.0000 mg | ORAL_TABLET | Freq: Once | ORAL | Status: AC
  Administered 2017-10-06: 50 mg via ORAL

## 2017-10-06 MED ORDER — LIDOCAINE 5 % EX PTCH
1.0000 | MEDICATED_PATCH | CUTANEOUS | Status: DC
  Filled 2017-10-06 (×2): qty 1

## 2017-10-06 MED ORDER — LIDOCAINE 5 % EX PTCH
MEDICATED_PATCH | CUTANEOUS | Status: AC
  Administered 2017-10-06: 1 via TRANSDERMAL
  Filled 2017-10-06: qty 1

## 2017-10-06 MED ORDER — TRAMADOL HCL 50 MG PO TABS
ORAL_TABLET | ORAL | Status: AC
  Filled 2017-10-06: qty 1

## 2017-10-06 MED ORDER — LIDOCAINE 5 % EX PTCH: 1.0000 | MEDICATED_PATCH | Freq: Two times a day (BID) | CUTANEOUS | 0 refills | Status: DC

## 2017-10-06 NOTE — ED Provider Notes (Signed)
[facility] Emergency Department Provider Note    First MD Initiated Contact with Patient 10/06/17 1628     (approximate)  I have reviewed the triage vital signs and the nursing notes.   HISTORY  Chief Complaint Leg Pain    HPI Doraine Schexnider is a 72 y.o. female history of breast cancer as well as family history of bone cancer presents to the ER with right leg pain.  Started roughly 10 days ago after she stumbled in her apartment tripping over her purse strap.  Denied hit her head.  Did not actually fall.  Thinks she might have strained or pulled her muscle.  Denies any numbness or tingling.  States it feels kind of like a stiffness that will not go away.  Taken anything for pain at home.  Past Medical History:  Diagnosis Date  . Arthritis   . Cancer [facility]) 2004   right breast ca  . Diabetes mellitus without complication ([place])   . Hypercholesterolemia   . Hypertension   . Hypothyroid   . Seasonal allergies    Family History  Problem Relation Age of Onset  . Bladder Cancer Neg Hx   . Kidney cancer Neg Hx    Past Surgical History:  Procedure Laterality Date  . ABDOMINAL HYSTERECTOMY    . BREAST BIOPSY Left    neg  . BREAST EXCISIONAL BIOPSY Right 2004   breast ca and lumpectomy  . BREAST LUMPECTOMY Right 2004   breast ca  . CHOLECYSTECTOMY    . COLONOSCOPY WITH PROPOFOL N/A 02/13/2017   Procedure: COLONOSCOPY WITH PROPOFOL;  Surgeon: Jonathon Bellows, MD;  Location: [facility] ENDOSCOPY;  Service: Gastroenterology;  Laterality: N/A;  . KIDNEY STONE SURGERY     Patient Active Problem List   Diagnosis Date Noted  . Bipolar I disorder, most recent episode depressed with anxious distress ([place]) 07/07/2017  . PTSD (post-traumatic stress disorder) 07/07/2017  . 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 Diabetes ([place]) 09/23/2014  . Essential hypertension 09/21/2014  . Hypothyroidism 09/21/2014  . Dyslipidemia  09/21/2014  . Bipolar I disorder, current or most recent episode manic, severe with mixed features ([place]) 09/20/2014      Prior to Admission medications   Medication Sig Start Date End Date Taking? Authorizing Provider  aspirin 81 MG EC tablet Take 1 tablet (81 mg total) by mouth daily. Swallow whole. 01/18/16   Earleen Newport, MD  cetirizine (ZYRTEC) 10 MG tablet Take 10 mg by mouth daily.    [provider]  clonazePAM (KLONOPIN) 1 MG tablet Take 1 tablet (1 mg total) by mouth at bedtime. 10/11/16   Clapacs, Madie Reno, MD  enalapril (VASOTEC) 20 MG tablet Take 1 tablet (20 mg total) by mouth 2 (two) times daily. 09/23/14   Pucilowska, Herma Ard B, MD  FLUoxetine (PROZAC) 20 MG capsule Take 1 capsule (20 mg total) by mouth daily. 07/11/17   Pucilowska, Jolanta B, MD  glipiZIDE (GLUCOTROL XL) 5 MG 24 hr tablet Take 5 mg by mouth 2 (two) times daily.  01/14/17   [provider]  levothyroxine (SYNTHROID, LEVOTHROID) 75 MCG tablet Take 1 tablet (75 mcg total) by mouth every morning. 09/23/14   Pucilowska, Jolanta B, MD  Olopatadine HCl 0.2 % SOLN Place 1 drop into both eyes daily.  01/06/17   [provider]  simvastatin (ZOCOR) 20 MG tablet Take 1 tablet (20 mg total) by mouth daily. 09/23/14   Pucilowska, Jolanta B,  MD  traZODone (DESYREL) 100 MG tablet Take 1 tablet (100 mg total) by mouth at bedtime. 10/11/16   Clapacs, Madie Reno, MD    Allergies Navane [thiothixene] and Penicillins    Social History Social History   Tobacco Use  . Smoking status: Former Smoker    Types: Cigarettes  . Smokeless tobacco: Never Used  Substance Use Topics  . Alcohol use: No  . Drug use: No    Review of Systems Patient denies headaches, rhinorrhea, blurry vision, numbness, shortness of breath, chest pain, edema, cough, abdominal pain, nausea, vomiting, diarrhea, dysuria, fevers, rashes or hallucinations unless otherwise stated above in  HPI. ____________________________________________   PHYSICAL EXAM:  VITAL SIGNS: Vitals:   10/06/17 1538 10/06/17 1724  BP: (!) 140/100 (!) 136/100  Pulse: 100 77  Resp: 18 16  Temp: 98.3 F (36.8 C)   SpO2: 98% 94%    Constitutional: Alert and oriented. Well appearing and in no acute distress. Eyes: Conjunctivae are normal.  Head: Atraumatic. Nose: No congestion/rhinnorhea. Mouth/Throat: Mucous membranes are moist.   Neck: No stridor. Painless ROM.  Cardiovascular: Normal rate, regular rhythm. Grossly normal heart sounds.  Good peripheral circulation. Respiratory: Normal respiratory effort.  No retractions. Lungs CTAB. Gastrointestinal: Soft and nontender. No distention. No abdominal bruits. No CVA tenderness. Genitourinary:  Musculoskeletal:pain with palpation of superior gastroc, no effusion, warmth or erythema of knee, + crepitus with ROM of right knee.  2+ DPO and PT pulses Neurologic:  Normal speech and language. No gross focal neurologic deficits are appreciated. No facial droop Skin:  Skin is warm, dry and intact. No rash noted. Psychiatric: very anxious appearing ____________________________________________   LABS (all labs ordered are listed, but only abnormal results are displayed)  No results found for this or any previous visit (from the past 24 hour(s)). ____________________________________________ ____________________________________________  [id number]  I personally reviewed all radiographic images ordered to evaluate for the above acute complaints and reviewed radiology reports and findings.  These findings were personally discussed with the patient.  Please see medical record for radiology report.  ____________________________________________   PROCEDURES  Procedure(s) performed:  Procedures    Critical Care performed: no ____________________________________________   INITIAL IMPRESSION / ASSESSMENT AND PLAN / ED COURSE  Pertinent labs &  imaging results that were available during my care of the patient were reviewed by me and considered in my medical decision making (see chart for details).  DDX: fracture, contusion, arthritis, sprain, dvt   Etherine Lalla Laham is a 72 y.o. who presents to the ED with right knee pain. Denies any other injuries. Denies motor or sensory loss. Able to bear weight. VSS in ED. Exam as above. NV intact throughout and distal to injury. Pt able to range joint. No ligament laxity on exam. No clinical suspicion for infectious process or septic joint. X-rays w/o fracture. No other injuries reported or noted on exam.  No evidence of DVT by [place].   Presentation c/w sprain. Discussed supportive care and follow up with ortho.     As part of my medical decision making, I reviewed the following data within the [facility] notes reviewed and incorporated, Labs reviewed, notes from prior ED visits.   ____________________________________________   FINAL CLINICAL IMPRESSION(S) / ED DIAGNOSES  Final diagnoses:  Acute pain of right knee      NEW MEDICATIONS STARTED DURING THIS VISIT:  New Prescriptions   No medications on file     Note:  This document was prepared using Dragon  voice recognition software and may include unintentional dictation errors.    Merlyn Lot, MD 10/06/17 [phone]

## 2017-10-06 NOTE — ED Triage Notes (Addendum)
Right lower leg pain X 10 days ago, describes as stiffness. swelling to right calf. Pain to posterior and lateral right calf. No injury. Pt alert and oriented X4, active, cooperative, pt in NAD. RR even and unlabored, color WNL.

## 2018-01-20 ENCOUNTER — Other Ambulatory Visit: Payer: Self-pay | Admitting: Nephrology

## 2018-01-20 DIAGNOSIS — R8281 Pyuria: Secondary | ICD-10-CM

## 2018-01-20 DIAGNOSIS — R809 Proteinuria, unspecified: Secondary | ICD-10-CM

## 2018-01-20 DIAGNOSIS — N183 Chronic kidney disease, stage 3 unspecified: Secondary | ICD-10-CM

## 2018-01-28 ENCOUNTER — Ambulatory Visit
Admission: RE | Admit: 2018-01-28 | Discharge: 2018-01-28 | Disposition: A | Discharge status: Home / Self Care | Payer: Medicare Other | Source: Ambulatory Visit | Attending: Nephrology | Admitting: Nephrology

## 2018-01-28 DIAGNOSIS — N2889 Other specified disorders of kidney and ureter: Secondary | ICD-10-CM | POA: Diagnosis not present

## 2018-01-28 DIAGNOSIS — R8281 Pyuria: Secondary | ICD-10-CM

## 2018-01-28 DIAGNOSIS — B9689 Other specified bacterial agents as the cause of diseases classified elsewhere: Secondary | ICD-10-CM | POA: Diagnosis not present

## 2018-01-28 DIAGNOSIS — N390 Urinary tract infection, site not specified: Secondary | ICD-10-CM | POA: Insufficient documentation

## 2018-01-28 DIAGNOSIS — N183 Chronic kidney disease, stage 3 unspecified: Secondary | ICD-10-CM

## 2018-01-28 DIAGNOSIS — R809 Proteinuria, unspecified: Secondary | ICD-10-CM | POA: Diagnosis not present

## 2018-01-31 ENCOUNTER — Encounter: Payer: Self-pay | Admitting: Emergency Medicine

## 2018-01-31 ENCOUNTER — Other Ambulatory Visit: Payer: Self-pay

## 2018-01-31 ENCOUNTER — Emergency Department
Admission: EM | Admit: 2018-01-31 | Discharge: 2018-01-31 | Disposition: A | Discharge status: Home / Self Care | Payer: Medicare Other | Attending: Emergency Medicine | Admitting: Emergency Medicine

## 2018-01-31 DIAGNOSIS — Z87891 Personal history of nicotine dependence: Secondary | ICD-10-CM | POA: Insufficient documentation

## 2018-01-31 DIAGNOSIS — Z7984 Long term (current) use of oral hypoglycemic drugs: Secondary | ICD-10-CM | POA: Insufficient documentation

## 2018-01-31 DIAGNOSIS — G479 Sleep disorder, unspecified: Secondary | ICD-10-CM

## 2018-01-31 DIAGNOSIS — E039 Hypothyroidism, unspecified: Secondary | ICD-10-CM | POA: Insufficient documentation

## 2018-01-31 DIAGNOSIS — R400 Somnolence: Secondary | ICD-10-CM | POA: Insufficient documentation

## 2018-01-31 DIAGNOSIS — E119 Type 2 diabetes mellitus without complications: Secondary | ICD-10-CM | POA: Insufficient documentation

## 2018-01-31 DIAGNOSIS — E7800 Pure hypercholesterolemia, unspecified: Secondary | ICD-10-CM | POA: Insufficient documentation

## 2018-01-31 DIAGNOSIS — R630 Anorexia: Secondary | ICD-10-CM | POA: Insufficient documentation

## 2018-01-31 DIAGNOSIS — Z7982 Long term (current) use of aspirin: Secondary | ICD-10-CM | POA: Insufficient documentation

## 2018-01-31 DIAGNOSIS — Z79899 Other long term (current) drug therapy: Secondary | ICD-10-CM | POA: Insufficient documentation

## 2018-01-31 DIAGNOSIS — I10 Essential (primary) hypertension: Secondary | ICD-10-CM | POA: Insufficient documentation

## 2018-01-31 DIAGNOSIS — Z853 Personal history of malignant neoplasm of breast: Secondary | ICD-10-CM | POA: Insufficient documentation

## 2018-01-31 NOTE — ED Triage Notes (Signed)
Pt does not want blood work until seen by MD.  "I have had every lab under the sun this last week and an [place]"

## 2018-01-31 NOTE — ED Notes (Signed)
Pt verbalizes  "I have not been able to sleep - I have not been able to eat - I went to [facility] two weeks ago and begged them for something to help me sleep  - they sent me home - it cost me $17.00 to take a cab home  - I went back 4 days later cause I had still not slept - Dr Randel Books never saw me and I knew that I only had a little bit of money so I ahd to take a bus close to home and then pay the cab $5.50 to get me all the way home  - I went for help and they make me wait all day - I have been left there with no way home before cause they will just close and leave.Marland Kitchenibuprofen need some help cause I cannot sleep and it is going to cause me to have a breakdown - I am not going to harm myself or anyone else - I have been taking my medicine like I am supposed to  - I had a renal US this week and blood work - I go back to [place] on Monday and the kidney doctor on Tuesday  - I just could not wait any longer to get some much needed sleep."

## 2018-01-31 NOTE — ED Notes (Signed)

## 2018-01-31 NOTE — ED Triage Notes (Signed)
First Nurse Note:  Arrives to ED for psych eval.  Patient states she is nervous. Cannot sleep. Cannot eat.  Cannot drink the fluids she is supposed to drink. Denies SI/ HI.  Anxious.  Cooperative.

## 2018-01-31 NOTE — ED Notes (Signed)
BEHAVIORAL HEALTH ROUNDING Patient sleeping: No. Patient alert and oriented: yes Behavior appropriate: Yes.  ; If no, describe:  Nutrition and fluids offered: yes Toileting and hygiene offered: Yes  Sitter present: q15 minute observations and security  monitoring Law enforcement present: Yes  ODS  

## 2018-01-31 NOTE — ED Triage Notes (Signed)
Dressed out by this Community education officer.  All belongings in one bag.

## 2018-01-31 NOTE — ED Provider Notes (Signed)
[facility] Emergency Department Provider Note    ____________________________________________   I have reviewed the triage vital signs and the nursing notes.   HISTORY  Chief Complaint Psychiatric Evaluation   History limited by: Not Limited   HPI [name] is a 72 y.o. female who presents to the emergency department today because of concerns that she is not sleeping.  She states this is been going on for a couple of weeks.  The patient states that along with her decreased in sleeping she has also decreased appetite.  She states that she has been to [facility] a couple of times without any significant help.  She denies any SI or HI.     Per medical record review patient has a history of bipolar, PTSD  Past Medical History:  Diagnosis Date  . Arthritis   . Cancer [facility]) 2004   right breast ca  . Diabetes mellitus without complication ([place])   . Hypercholesterolemia   . Hypertension   . Hypothyroid   . Seasonal allergies     Patient Active Problem List   Diagnosis Date Noted  . Bipolar I disorder, most recent episode depressed with anxious distress ([place]) 07/07/2017  . PTSD (post-traumatic stress disorder) 07/07/2017  . 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 Diabetes ([place]) 09/23/2014  . Essential hypertension 09/21/2014  . Hypothyroidism 09/21/2014  . Dyslipidemia 09/21/2014  . Bipolar I disorder, current or most recent episode manic, severe with mixed features ([place]) 09/20/2014    Past Surgical History:  Procedure Laterality Date  . ABDOMINAL HYSTERECTOMY    . BREAST BIOPSY Left    neg  . BREAST EXCISIONAL BIOPSY Right 2004   breast ca and lumpectomy  . BREAST LUMPECTOMY Right 2004   breast ca  . CHOLECYSTECTOMY    . COLONOSCOPY WITH PROPOFOL N/A 02/13/2017   Procedure: COLONOSCOPY WITH PROPOFOL;  Surgeon: Jonathon Bellows, MD;  Location: [facility] ENDOSCOPY;  Service: Gastroenterology;  Laterality:  N/A;  . KIDNEY STONE SURGERY      Prior to Admission medications   Medication Sig Start Date End Date Taking? Authorizing Provider  aspirin 81 MG EC tablet Take 1 tablet (81 mg total) by mouth daily. Swallow whole. 01/18/16   Earleen Newport, MD  cetirizine (ZYRTEC) 10 MG tablet Take 10 mg by mouth daily.    [provider]  clonazePAM (KLONOPIN) 1 MG tablet Take 1 tablet (1 mg total) by mouth at bedtime. 10/11/16   Clapacs, Madie Reno, MD  enalapril (VASOTEC) 20 MG tablet Take 1 tablet (20 mg total) by mouth 2 (two) times daily. 09/23/14   Pucilowska, Herma Ard B, MD  FLUoxetine (PROZAC) 20 MG capsule Take 1 capsule (20 mg total) by mouth daily. 07/11/17   Pucilowska, Jolanta B, MD  glipiZIDE (GLUCOTROL XL) 5 MG 24 hr tablet Take 5 mg by mouth 2 (two) times daily.  01/14/17   [provider]  levothyroxine (SYNTHROID, LEVOTHROID) 75 MCG tablet Take 1 tablet (75 mcg total) by mouth every morning. 09/23/14   Pucilowska, Jolanta B, MD  lidocaine (LIDODERM) 5 % Place 1 patch onto the skin every 12 (twelve) hours. Remove & Discard patch within 12 hours or as directed by MD 10/06/17 10/06/18  Merlyn Lot, MD  Olopatadine HCl 0.2 % SOLN Place 1 drop into both eyes daily.  01/06/17   [provider]  simvastatin (ZOCOR) 20 MG tablet Take 1 tablet (20 mg total) by mouth daily. 09/23/14   Pucilowska,  Jolanta B, MD  traMADol (ULTRAM) 50 MG tablet Take 1 tablet (50 mg total) by mouth every 6 (six) hours as needed. 10/06/17 10/06/18  Merlyn Lot, MD  traZODone (DESYREL) 100 MG tablet Take 1 tablet (100 mg total) by mouth at bedtime. 10/11/16   Clapacs, Madie Reno, MD    Allergies Navane [thiothixene] and Penicillins  Family History  Problem Relation Age of Onset  . Bladder Cancer Neg Hx   . Kidney cancer Neg Hx     Social History Social History   Tobacco Use  . Smoking status: Former Smoker    Types: Cigarettes  . Smokeless tobacco: Never Used  Substance Use Topics  .  Alcohol use: No  . Drug use: No    Review of Systems Constitutional: No fever/chills Eyes: No visual changes. ENT: No sore throat. Cardiovascular: Denies chest pain. Respiratory: Denies shortness of breath. Gastrointestinal: No abdominal pain.  No nausea, no vomiting.  No diarrhea.   Genitourinary: Negative for dysuria. Musculoskeletal: Negative for back pain. Skin: Negative for rash. Neurological: Negative for headaches, focal weakness or numbness.  ____________________________________________   PHYSICAL EXAM:  VITAL SIGNS: ED Triage Vitals  Enc Vitals Group     BP 01/31/18 1432 (!) 150/76     Pulse Rate 01/31/18 1432 76     Resp 01/31/18 1432 18     Temp 01/31/18 1432 99 F (37.2 C)     Temp Source 01/31/18 1432 Oral     SpO2 01/31/18 1432 94 %     Weight 01/31/18 1430 173 lb (78.5 kg)     Height 01/31/18 1430 5\' 1"  (1.549 m)     Head Circumference --      Peak Flow --      Pain Score 01/31/18 1428 0   Constitutional: Alert and oriented.  Eyes: Conjunctivae are normal.  ENT      Head: Normocephalic and atraumatic.      Nose: No congestion/rhinnorhea.      Mouth/Throat: Mucous membranes are moist.      Neck: No stridor. Cardiovascular: Normal rate, regular rhythm.  No murmurs, rubs, or gallops.  Respiratory: Normal respiratory effort without tachypnea nor retractions. Breath sounds are clear and equal bilaterally. No wheezes/rales/rhonchi. Gastrointestinal: Soft and non tender. No rebound. No guarding.  Genitourinary: Deferred Musculoskeletal: Normal range of motion in all extremities.  Neurologic:  Normal speech and language. No gross focal neurologic deficits are appreciated.  Skin:  Skin is warm, dry and intact. No rash noted. Psychiatric: Mood and affect are normal. Denies SI/HI  ____________________________________________    LABS (pertinent  positives/negatives)  None  ____________________________________________   EKG  None  ____________________________________________    RADIOLOGY  None  ____________________________________________   PROCEDURES  Procedures  ____________________________________________   INITIAL IMPRESSION / ASSESSMENT AND PLAN / ED COURSE  Pertinent labs & imaging results that were available during my care of the patient were reviewed by me and considered in my medical decision making (see chart for details).   Patient presented to the emergency department today because of concerns for decreased appetite and sleep.  Patient denies any SI or HI.  By the time my examination she states she would like to be discharged to follow-up.  At this point given lack of SI or HI do not feel she meets involuntary commitment.  Did encourage patient to stay to speak to psychiatry although she declined.  Will discharge.    ____________________________________________   FINAL CLINICAL IMPRESSION(S) / ED DIAGNOSES  Final diagnoses:  Sleeping difficulties  Decrease in appetite     Note: This dictation was prepared with Dragon dictation. Any transcriptional errors that result from this process are unintentional     Nance Pear, MD 01/31/18 1644

## 2018-01-31 NOTE — ED Triage Notes (Signed)
"  I am having a mental breakdown. I cannot sleep, I cannot eat, I probably have kidney disease, I am supposed to drink a lot of water and cannot drink any.  I feel like I am shaking on inside even though I am not shaking on outside.  I should be crying and going to hell in pieces and I am not". Denies anxiety, depression, SI/HI.  Recently treated for UTI.  "[facility] told me I am paranoid and delusional and I wanted to tell them they were".

## 2018-01-31 NOTE — Discharge Instructions (Addendum)
Please seek medical attention and help for any thoughts about wanting to harm yourself, harm others, any concerning change in behavior, severe depression, inappropriate drug use or any other new or concerning symptoms. ° °

## 2018-02-02 ENCOUNTER — Inpatient Hospital Stay
Admission: AD | Admit: 2018-02-02 | Discharge: 2018-02-09 | DRG: 885 | Disposition: A | Discharge status: Home / Self Care | Payer: Medicare Other | Source: Intra-hospital | Attending: Psychiatry | Admitting: Psychiatry

## 2018-02-02 ENCOUNTER — Other Ambulatory Visit: Payer: Self-pay

## 2018-02-02 ENCOUNTER — Encounter: Payer: Self-pay | Admitting: Emergency Medicine

## 2018-02-02 ENCOUNTER — Emergency Department
Admission: EM | Admit: 2018-02-02 | Discharge: 2018-02-02 | Disposition: A | Discharge status: Other Acute Inpatient Hospital | Payer: Medicare Other | Attending: Emergency Medicine | Admitting: Emergency Medicine

## 2018-02-02 DIAGNOSIS — G4700 Insomnia, unspecified: Secondary | ICD-10-CM | POA: Diagnosis present

## 2018-02-02 DIAGNOSIS — Z23 Encounter for immunization: Secondary | ICD-10-CM | POA: Diagnosis not present

## 2018-02-02 DIAGNOSIS — Z888 Allergy status to other drugs, medicaments and biological substances status: Secondary | ICD-10-CM | POA: Diagnosis not present

## 2018-02-02 DIAGNOSIS — Z818 Family history of other mental and behavioral disorders: Secondary | ICD-10-CM

## 2018-02-02 DIAGNOSIS — F3113 Bipolar disorder, current episode manic without psychotic features, severe: Secondary | ICD-10-CM | POA: Diagnosis present

## 2018-02-02 DIAGNOSIS — E039 Hypothyroidism, unspecified: Secondary | ICD-10-CM | POA: Insufficient documentation

## 2018-02-02 DIAGNOSIS — F319 Bipolar disorder, unspecified: Secondary | ICD-10-CM | POA: Diagnosis present

## 2018-02-02 DIAGNOSIS — E785 Hyperlipidemia, unspecified: Secondary | ICD-10-CM | POA: Diagnosis present

## 2018-02-02 DIAGNOSIS — E1169 Type 2 diabetes mellitus with other specified complication: Secondary | ICD-10-CM

## 2018-02-02 DIAGNOSIS — F332 Major depressive disorder, recurrent severe without psychotic features: Secondary | ICD-10-CM | POA: Insufficient documentation

## 2018-02-02 DIAGNOSIS — R05 Cough: Secondary | ICD-10-CM | POA: Diagnosis present

## 2018-02-02 DIAGNOSIS — I10 Essential (primary) hypertension: Secondary | ICD-10-CM | POA: Diagnosis present

## 2018-02-02 DIAGNOSIS — F3130 Bipolar disorder, current episode depressed, mild or moderate severity, unspecified: Principal | ICD-10-CM | POA: Diagnosis present

## 2018-02-02 DIAGNOSIS — Z7982 Long term (current) use of aspirin: Secondary | ICD-10-CM | POA: Diagnosis not present

## 2018-02-02 DIAGNOSIS — Z880 Allergy status to penicillin: Secondary | ICD-10-CM | POA: Diagnosis not present

## 2018-02-02 DIAGNOSIS — Z79899 Other long term (current) drug therapy: Secondary | ICD-10-CM

## 2018-02-02 DIAGNOSIS — E119 Type 2 diabetes mellitus without complications: Secondary | ICD-10-CM

## 2018-02-02 DIAGNOSIS — F314 Bipolar disorder, current episode depressed, severe, without psychotic features: Secondary | ICD-10-CM | POA: Diagnosis present

## 2018-02-02 DIAGNOSIS — Z7984 Long term (current) use of oral hypoglycemic drugs: Secondary | ICD-10-CM | POA: Diagnosis not present

## 2018-02-02 DIAGNOSIS — Z853 Personal history of malignant neoplasm of breast: Secondary | ICD-10-CM | POA: Diagnosis not present

## 2018-02-02 DIAGNOSIS — G479 Sleep disorder, unspecified: Secondary | ICD-10-CM

## 2018-02-02 DIAGNOSIS — F4325 Adjustment disorder with mixed disturbance of emotions and conduct: Secondary | ICD-10-CM | POA: Diagnosis present

## 2018-02-02 DIAGNOSIS — F4310 Post-traumatic stress disorder, unspecified: Secondary | ICD-10-CM | POA: Diagnosis present

## 2018-02-02 DIAGNOSIS — Z87891 Personal history of nicotine dependence: Secondary | ICD-10-CM | POA: Insufficient documentation

## 2018-02-02 DIAGNOSIS — R45851 Suicidal ideations: Secondary | ICD-10-CM | POA: Diagnosis present

## 2018-02-02 DIAGNOSIS — R52 Pain, unspecified: Secondary | ICD-10-CM

## 2018-02-02 LAB — COMPREHENSIVE METABOLIC PANEL
ALBUMIN: 4.6 g/dL (ref 3.5–5.0)
ALT: 13 U/L (ref 0–44)
AST: 21 U/L (ref 15–41)
Alkaline Phosphatase: 55 U/L (ref 38–126)
Anion gap: 8 (ref 5–15)
BUN: 26 mg/dL — AB (ref 8–23)
CHLORIDE: 108 mmol/L (ref 98–111)
CO2: 23 mmol/L (ref 22–32)
CREATININE: 1.32 mg/dL — AB (ref 0.44–1.00)
Calcium: 9.7 mg/dL (ref 8.9–10.3)
GFR calc Af Amer: 45 mL/min — ABNORMAL LOW (ref >60)
GFR, EST NON AFRICAN AMERICAN: 39 mL/min — AB (ref >60)
GLUCOSE: 167 mg/dL — AB (ref 70–99)
POTASSIUM: 4 mmol/L (ref 3.5–5.1)
SODIUM: 139 mmol/L (ref 135–145)
Total Bilirubin: 0.6 mg/dL (ref 0.3–1.2)
Total Protein: 8.1 g/dL (ref 6.5–8.1)

## 2018-02-02 LAB — CBC
HCT: 42.8 % (ref 35.0–47.0)
Hemoglobin: 14.8 g/dL (ref 12.0–16.0)
MCH: 30.9 pg (ref 26.0–34.0)
MCHC: 34.5 g/dL (ref 32.0–36.0)
MCV: 89.7 fL (ref 80.0–100.0)
PLATELETS: 306 10*3/uL (ref 150–440)
RBC: 4.77 MIL/uL (ref 3.80–5.20)
RDW: 13 % (ref 11.5–14.5)
WBC: 8.8 10*3/uL (ref 3.6–11.0)

## 2018-02-02 MED ORDER — ASPIRIN EC 81 MG PO TBEC
81.0000 mg | DELAYED_RELEASE_TABLET | Freq: Every day | ORAL | Status: DC
  Administered 2018-02-02: 81 mg via ORAL
  Filled 2018-02-02: qty 1

## 2018-02-02 MED ORDER — GLIPIZIDE ER 5 MG PO TB24: 5.0000 mg | ORAL_TABLET | Freq: Every day | ORAL | Status: DC

## 2018-02-02 MED ORDER — ENALAPRIL MALEATE 20 MG PO TABS
20.0000 mg | ORAL_TABLET | Freq: Every day | ORAL | Status: DC
  Administered 2018-02-03 – 2018-02-09 (×7): 20 mg via ORAL
  Filled 2018-02-02 (×8): qty 1

## 2018-02-02 MED ORDER — LEVOTHYROXINE SODIUM 50 MCG PO TABS: 75.0000 ug | ORAL_TABLET | Freq: Every day | ORAL | Status: DC

## 2018-02-02 MED ORDER — CLONAZEPAM 0.5 MG PO TABS: 1.0000 mg | ORAL_TABLET | Freq: Every day | ORAL | Status: DC

## 2018-02-02 MED ORDER — MAGNESIUM HYDROXIDE 400 MG/5ML PO SUSP
30.0000 mL | Freq: Every day | ORAL | Status: DC | PRN
  Administered 2018-02-07: 30 mL via ORAL
  Filled 2018-02-02: qty 30

## 2018-02-02 MED ORDER — ENALAPRIL MALEATE 20 MG PO TABS
20.0000 mg | ORAL_TABLET | Freq: Every day | ORAL | Status: DC
  Administered 2018-02-02: 20 mg via ORAL
  Filled 2018-02-02 (×2): qty 1

## 2018-02-02 MED ORDER — GLIPIZIDE ER 5 MG PO TB24
5.0000 mg | ORAL_TABLET | Freq: Two times a day (BID) | ORAL | Status: DC
  Administered 2018-02-02: 5 mg via ORAL
  Filled 2018-02-02 (×2): qty 1

## 2018-02-02 MED ORDER — HYDROXYZINE HCL 25 MG PO TABS
25.0000 mg | ORAL_TABLET | Freq: Three times a day (TID) | ORAL | Status: DC | PRN
  Administered 2018-02-02: 25 mg via ORAL
  Filled 2018-02-02: qty 1

## 2018-02-02 MED ORDER — LORATADINE 10 MG PO TABS
10.0000 mg | ORAL_TABLET | Freq: Every day | ORAL | Status: DC
  Administered 2018-02-02: 10 mg via ORAL
  Filled 2018-02-02: qty 1

## 2018-02-02 MED ORDER — TRAZODONE HCL 100 MG PO TABS
100.0000 mg | ORAL_TABLET | Freq: Every day | ORAL | Status: DC
  Administered 2018-02-02 – 2018-02-08 (×7): 100 mg via ORAL
  Filled 2018-02-02 (×7): qty 1

## 2018-02-02 MED ORDER — GLIPIZIDE ER 5 MG PO TB24
5.0000 mg | ORAL_TABLET | Freq: Two times a day (BID) | ORAL | Status: DC
  Administered 2018-02-02 – 2018-02-09 (×14): 5 mg via ORAL
  Filled 2018-02-02 (×16): qty 1

## 2018-02-02 MED ORDER — LORATADINE 10 MG PO TABS
10.0000 mg | ORAL_TABLET | Freq: Every day | ORAL | Status: DC
  Administered 2018-02-03 – 2018-02-09 (×7): 10 mg via ORAL
  Filled 2018-02-02 (×8): qty 1

## 2018-02-02 MED ORDER — TRAZODONE HCL 100 MG PO TABS: 100.0000 mg | ORAL_TABLET | Freq: Every day | ORAL | Status: DC

## 2018-02-02 MED ORDER — CLONAZEPAM 1 MG PO TABS
1.0000 mg | ORAL_TABLET | Freq: Every day | ORAL | Status: DC
  Administered 2018-02-02 – 2018-02-08 (×7): 1 mg via ORAL
  Filled 2018-02-02 (×7): qty 1

## 2018-02-02 MED ORDER — ACETAMINOPHEN 325 MG PO TABS
650.0000 mg | ORAL_TABLET | Freq: Four times a day (QID) | ORAL | Status: DC | PRN
  Administered 2018-02-03 – 2018-02-06 (×3): 650 mg via ORAL
  Filled 2018-02-02 (×3): qty 2

## 2018-02-02 MED ORDER — SIMVASTATIN 40 MG PO TABS
20.0000 mg | ORAL_TABLET | Freq: Every day | ORAL | Status: DC
  Administered 2018-02-03 – 2018-02-08 (×6): 20 mg via ORAL
  Filled 2018-02-02 (×6): qty 1

## 2018-02-02 MED ORDER — FLUOXETINE HCL 20 MG PO CAPS
20.0000 mg | ORAL_CAPSULE | Freq: Every day | ORAL | Status: DC
  Administered 2018-02-02: 20 mg via ORAL
  Filled 2018-02-02: qty 1

## 2018-02-02 MED ORDER — LORAZEPAM 0.5 MG PO TABS
0.5000 mg | ORAL_TABLET | Freq: Once | ORAL | Status: AC
  Administered 2018-02-02: 0.5 mg via ORAL
  Filled 2018-02-02: qty 1

## 2018-02-02 MED ORDER — SIMVASTATIN 20 MG PO TABS
20.0000 mg | ORAL_TABLET | Freq: Every day | ORAL | Status: DC
  Administered 2018-02-02: 20 mg via ORAL
  Filled 2018-02-02: qty 1

## 2018-02-02 MED ORDER — LEVOTHYROXINE SODIUM 75 MCG PO TABS
75.0000 ug | ORAL_TABLET | Freq: Every day | ORAL | Status: DC
  Administered 2018-02-03 – 2018-02-09 (×7): 75 ug via ORAL
  Filled 2018-02-02 (×7): qty 1

## 2018-02-02 MED ORDER — ASPIRIN EC 81 MG PO TBEC
81.0000 mg | DELAYED_RELEASE_TABLET | Freq: Every day | ORAL | Status: DC
  Administered 2018-02-03 – 2018-02-09 (×7): 81 mg via ORAL
  Filled 2018-02-02 (×7): qty 1

## 2018-02-02 MED ORDER — FLUOXETINE HCL 20 MG PO CAPS
20.0000 mg | ORAL_CAPSULE | Freq: Every day | ORAL | Status: DC
  Administered 2018-02-03 – 2018-02-09 (×7): 20 mg via ORAL
  Filled 2018-02-02 (×7): qty 1

## 2018-02-02 MED ORDER — ALUM & MAG HYDROXIDE-SIMETH 200-200-20 MG/5ML PO SUSP
30.0000 mL | ORAL | Status: DC | PRN
  Administered 2018-02-03: 30 mL via ORAL
  Filled 2018-02-02: qty 30

## 2018-02-02 MED ORDER — QUETIAPINE FUMARATE 100 MG PO TABS
100.0000 mg | ORAL_TABLET | Freq: Every day | ORAL | Status: DC
  Administered 2018-02-02 – 2018-02-08 (×7): 100 mg via ORAL
  Filled 2018-02-02 (×7): qty 1

## 2018-02-02 NOTE — ED Triage Notes (Signed)
Patient presents to the ED with anxiety and fear.  Patient states, "Most of the time I feel fine, but sometimes I feel more and more scared."  Patient reports that she does not see a counselor regularly but does see Dr. Randel Books at [facility] for "med checks".  Patient takes trazodone and clonazepam.  Patient denies SI and HI.  Patient states, "I don't know what I'm scared of, I'm just scared."  Patient tearful during triage.

## 2018-02-02 NOTE — Plan of Care (Signed)
  Problem: Coping: Goal: Coping ability will improve Outcome: Progressing Goal: Will verbalize feelings Outcome: Progressing   Problem: Education: Goal: Knowledge of [facility] Education information/materials will improve Outcome: Progressing Goal: Emotional status will improve Outcome: Progressing Goal: Mental status will improve Outcome: Progressing Goal: Verbalization of understanding the information provided will improve Outcome: Progressing   Problem: Education: Goal: Utilization of techniques to improve thought processes will improve Outcome: Progressing Goal: Knowledge of the prescribed therapeutic regimen will improve Outcome: Progressing   Problem: Activity: Goal: Interest or engagement in leisure activities will improve Outcome: Progressing Goal: Imbalance in normal sleep/wake cycle will improve Outcome: Progressing   Problem: Coping: Goal: Coping ability will improve Outcome: Progressing Goal: Will verbalize feelings Outcome: Progressing   Problem: Health Behavior/Discharge Planning: Goal: Ability to make decisions will improve Outcome: Progressing Goal: Compliance with therapeutic regimen will improve Outcome: Progressing   Problem: Role Relationship: Goal: Will demonstrate positive changes in social behaviors and relationships Outcome: Progressing   Problem: Safety: Goal: Ability to disclose and discuss suicidal ideas will improve Outcome: Progressing Goal: Ability to identify and utilize support systems that promote safety will improve Outcome: Progressing   Problem: Self-Concept: Goal: Will verbalize positive feelings about self Outcome: Progressing Goal: Level of anxiety will decrease Outcome: Progressing

## 2018-02-02 NOTE — ED Provider Notes (Signed)
[facility] Emergency Department Provider Note  ____________________________________________  Time seen: Approximately 10:59 AM  I have reviewed the triage vital signs and the nursing notes.   HISTORY  Chief Complaint Anxiety    HPI [name] is a 72 y.o. female with a history of bipolar disorder, PTSD, adjustment disorder, presenting for sleep disturbance and "nonphysical pain."  The patient reports that for the past several weeks, she has been having a lot of difficulty sleeping.  She also reports a "nonphysical pain all over" which she cannot tolerate anymore.  In the past, she has been reticent to take additional medications but states that if there is a medicine that will help her, she would like to take it.  She denies any medical complaints today.  She has no SI, HI or hallucinations.  Past Medical History:  Diagnosis Date  . Arthritis   . Cancer [facility]) 2004   right breast ca  . Diabetes mellitus without complication ([place])   . Hypercholesterolemia   . Hypertension   . Hypothyroid   . Seasonal allergies     Patient Active Problem List   Diagnosis Date Noted  . Bipolar I disorder, most recent episode depressed with anxious distress ([place]) 07/07/2017  . PTSD (post-traumatic stress disorder) 07/07/2017  . 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 Diabetes ([place]) 09/23/2014  . Essential hypertension 09/21/2014  . Hypothyroidism 09/21/2014  . Dyslipidemia 09/21/2014  . Bipolar I disorder, current or most recent episode manic, severe with mixed features ([place]) 09/20/2014    Past Surgical History:  Procedure Laterality Date  . ABDOMINAL HYSTERECTOMY    . BREAST BIOPSY Left    neg  . BREAST EXCISIONAL BIOPSY Right 2004   breast ca and lumpectomy  . BREAST LUMPECTOMY Right 2004   breast ca  . CHOLECYSTECTOMY    . COLONOSCOPY WITH PROPOFOL N/A 02/13/2017   Procedure: COLONOSCOPY WITH PROPOFOL;   Surgeon: Jonathon Bellows, MD;  Location: [facility] ENDOSCOPY;  Service: Gastroenterology;  Laterality: N/A;  . KIDNEY STONE SURGERY      Current Outpatient Rx  . Order #: [id number] Class: Historical Med  . Order #: [id number] Class: Print  . Order #: [id number] Class: Historical Med  . Order #: [id number] Class: Phone In  . Order #: [id number] Class: Normal  . Order #: [id number] Class: Historical Med  . Order #: [id number] Class: Normal  . Order #: [id number] Class: Historical Med  . Order #: [id number] Class: Normal  . Order #: [id number] Class: Phone In  . Order #: [id number] Class: Print  . Order #: [id number] Class: Print  . Order #: [id number] Class: Print    Allergies Navane [thiothixene] and Penicillins  Family History  Problem Relation Age of Onset  . Bladder Cancer Neg Hx   . Kidney cancer Neg Hx     Social History Social History   Tobacco Use  . Smoking status: Former Smoker    Types: Cigarettes  . Smokeless tobacco: Never Used  Substance Use Topics  . Alcohol use: No  . Drug use: No    Review of Systems Constitutional: No fever/chills. Eyes: No visual changes. ENT: No congestion or rhinorrhea. Cardiovascular: Denies chest pain. Denies palpitations. Respiratory: Denies shortness of breath.  No cough. Gastrointestinal: No abdominal pain.  No nausea, no vomiting.  No diarrhea.  No constipation. Genitourinary: Negative for dysuria. Musculoskeletal: Negative for back pain. Skin: Negative for rash. Neurological: Negative for headaches. No focal numbness, tingling or weakness.  Psychiatric:Positive sleep disturbance.  Positive nonphysical pain.  Denies SI,  HI or hallucinations on my examination. ____________________________________________   PHYSICAL EXAM:  VITAL SIGNS: ED Triage Vitals  Enc Vitals Group     BP 02/02/18 1032 (!) 165/132     Pulse Rate 02/02/18 1032 66     Resp 02/02/18 1032 20     Temp 02/02/18 1032 98.8 F (37.1 C)     Temp Source 02/02/18 1032 Oral     SpO2  02/02/18 1032 95 %     Weight 02/02/18 1035 173 lb (78.5 kg)     Height 02/02/18 1035 5' (1.524 m)     Head Circumference --      Peak Flow --      Pain Score 02/02/18 1033 0     Pain Loc --      Pain Edu? --      Excl. in [place]? --     Constitutional: Alert and oriented. Answers questions appropriately.  Intermittently tearful on examination. Eyes: Conjunctivae are normal.  EOMI. No scleral icterus. Head: Atraumatic. Nose: No congestion/rhinnorhea. Mouth/Throat: Mucous membranes are moist.  Neck: No stridor.  Supple.  No JVD.  No meningismus. Cardiovascular: Normal rate, regular rhythm. No murmurs, rubs or gallops.  Respiratory: Normal respiratory effort.  No accessory muscle use or retractions. Lungs CTAB.  No wheezes, rales or ronchi. Musculoskeletal: Moves all extremities well  Neurologic:  A&Ox3.  Speech is clear.  Face and smile are symmetric.  EOMI.  Moves all extremities well. Skin:  Skin is warm, dry and intact. No rash noted. Psychiatric: The patient has a depressed mood and anxious affect, and is intermittently tearful on my examination.  She actively denies SI, HI or hallucinations.  ____________________________________________   LABS (all labs ordered are listed, but only abnormal results are displayed)  Labs Reviewed  COMPREHENSIVE METABOLIC PANEL  CBC  URINALYSIS, COMPLETE (UACMP) WITH MICROSCOPIC   ____________________________________________  EKG  Not indicated ____________________________________________  RADIOLOGY  No results found.  ____________________________________________   PROCEDURES  Procedure(s) performed: None  Procedures  Critical Care performed: No ____________________________________________   INITIAL IMPRESSION / ASSESSMENT AND PLAN / ED COURSE  Pertinent labs & imaging results that were available during my care of the patient were reviewed by me and considered in my medical decision making (see chart for details).  72 y.o.  female with a history of bipolar and PTSD presenting with "nonphysical pain, and sleep disturbance.  The patient is mildly hypertensive upon arrival, but she is also intermittently agitated so we will follow this.  She has no medical complaints.  I have ordered a TTS and psychiatric evaluation for this patient.  She will remain voluntary as she does not mean any involuntary commitment criteria today.  ____________________________________________  FINAL CLINICAL IMPRESSION(S) / ED DIAGNOSES  Final diagnoses:  Sleep disturbance  Pain         NEW MEDICATIONS STARTED DURING THIS VISIT:  New Prescriptions   No medications on file      Eula Listen, MD 02/02/18 1505

## 2018-02-02 NOTE — Progress Notes (Signed)
Patient pleasant and cooperative during admission assessment. Patient denies SI/HI at this time. Patient denies AVH. Patient informed of fall risk status, fall risk assessed "low" at this time. Patient oriented to unit/staff/room. Patient denies any questions/concerns at this time. Patient safe on unit with Q15 minute checks for safety. Skin assessment and body search done,no contraband found.

## 2018-02-02 NOTE — ED Notes (Signed)
Called lower level behavioral medical unit to give report on the patient.  Was told that the nurse was giving medication at this time.  Caller, Izora Gala, stated that she would have the nurse return my call when she was finished.

## 2018-02-02 NOTE — ED Notes (Signed)
Pt cooperative with staff. Pt states she is here because she is unable to sleep and has a poor appetite. Pt also reports she continues to "run away" from treatment. Pt was here on Saturday and was sent home. "I wanted to leave, but I know I need help. I don't want to live with this fear."  Pt unable to explain exactly what scares her.  Pt with rambling speech and sudden crying spells.   Maintained on 15 minute checks and observation by security camera for safety.

## 2018-02-02 NOTE — BH Assessment (Signed)
Patient is to be admitted to [facility] by Dr. Weber Cooks.  Attending Physician will be Dr. Bary Leriche.   Patient has been assigned to room 319, by [place].   Intake Paper Work has been signed and placed on patient chart.  ER staff is aware of the admission:  [facility] ER [place] Patient's Nurse

## 2018-02-02 NOTE — ED Notes (Signed)
Pt given meal tray.

## 2018-02-02 NOTE — ED Notes (Signed)
Attempted to call report to lower level behavioral medicine unit.  Was told that the nurse that was taking the patient was on the phone.  Gave the caller my phone number and instructed them to have the nurse to call back ASAP.

## 2018-02-02 NOTE — Tx Team (Signed)
Initial Treatment Plan 02/02/2018 6:39 PM [name] YBO:[id number]    PATIENT STRESSORS: Health problems Medication change or noncompliance   PATIENT STRENGTHS: Ability for insight Active sense of humor Average or above average intelligence Communication skills   PATIENT IDENTIFIED PROBLEMS: Depression  02/02/18  Anxiety 02/02/18                   DISCHARGE CRITERIA:  Ability to meet basic life and health needs Adequate post-discharge living arrangements Improved stabilization in mood, thinking, and/or behavior  PRELIMINARY DISCHARGE PLAN: Outpatient therapy Return to previous living arrangement  PATIENT/FAMILY INVOLVEMENT: This treatment plan has been presented to and reviewed with the patient, [name], and/or family member,  The patient and family have been given the opportunity to ask questions and make suggestions.  Leodis Liverpool, RN 02/02/2018, 6:39 PM
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
## 2018-02-02 NOTE — ED Notes (Signed)
Pt moved to [place]. Report given to [facility], Therapist, sports.

## 2018-02-02 NOTE — Plan of Care (Signed)
Instructed  patient on information regarding unit programing , verbalize understanding  Problem: Education: Goal: Knowledge of [place] Education information/materials will improve Outcome: Progressing

## 2018-02-02 NOTE — BHH Group Notes (Signed)
[place] Group Notes:  (Nursing/MHT/Case Management/Adjunct)  Date:  02/02/2018  Time:  11:28 PM  Type of Therapy:  Group Therapy  Participation Level:  Active  Participation Quality:  Appropriate  Affect:  Appropriate  Cognitive:  Appropriate  Insight:  Appropriate  Engagement in Group:  Engaged  Modes of Intervention:  Discussion  Summary of Progress/Problems:  [name] 02/02/2018, 11:28 PM

## 2018-02-02 NOTE — ED Notes (Addendum)
Report given to lower level BMU.  Stated that the patient can come down.

## 2018-02-02 NOTE — Consult Note (Signed)
[facility] Face-to-Face Psychiatry Consult   Reason for Consult: Consult for this 72 year old woman well-known to the psychiatric service comes in voluntarily with emotional distress Referring Physician: McShane Patient Identification: [name] MRN:  [id number] Principal Diagnosis: Bipolar I disorder, current or most recent episode manic, severe with mixed features [facility]) Diagnosis:   Patient Active Problem List   Diagnosis Date Noted  . Bipolar I disorder, most recent episode depressed with anxious distress ([place]) [F31.9] 07/07/2017  . PTSD (post-traumatic stress disorder) [F43.10] 07/07/2017  . Adjustment disorder with mixed disturbance of emotions and conduct [F43.25] 10/11/2016  . Diabetes ([place]) [E11.9] 09/23/2014  . Essential hypertension [I10] 09/21/2014  . Hypothyroidism [E03.9] 09/21/2014  . Dyslipidemia [E78.5] 09/21/2014  . Bipolar I disorder, current or most recent episode manic, severe with mixed features ([place]) [F31.13] 09/20/2014    Total Time spent with patient: 1 hour  Subjective:   [name] is a 72 y.o. female patient admitted with "I just cannot take it anymore".  HPI: Patient seen chart reviewed.  72 year old woman with chronic mental health problems with diagnoses of bipolar disorder and PTSD.  Patient came in voluntarily because of overwhelming emotional distress.  Patient was cooperative with the interview but is so disorganized at times it is difficult to actually follow her.  She gets very tearful and upset starts howling at times in her distress.  Talks about how awful her life is been.  Patient is disorganized in her thinking.  Says she has not slept or eaten in days.  Seems to be compliant with her medicine but is currently only taking Klonopin and trazodone not any kind of mood medicine at all.  Denies drinking or drug abuse.  Has some suicidal thoughts with hopelessness.  Very disorganized in her thoughts with poor self-care.  Medical history:  Patient has diabetes managed with oral medication high blood pressure hypothyroidism dyslipidemia  Substance abuse history: None  Social history: Patient lives by herself.  She does have some extended family but contact with them is only intermittent.  She cries a lot about how lonely she is currently.  Past Psychiatric History: Patient has a long history of mood instability with a diagnosis of bipolar disorder and PTSD.  She has long been resistant to appropriate medication management.  Antidepressants have provided some transient relief but she is usually noncompliant with him and has always refused mood stabilizers.  Apparently she is no longer even taking an antidepressant and is only taking the trazodone at modest dose and the Klonopin.  Patient tells me she thinks Prozac was the most effective thing in the past.  Risk to Self: Suicidal Ideation: No Suicidal Intent: No Is patient at risk for suicide?: No Suicidal Plan?: No Access to Means: No What has been your use of drugs/alcohol within the last 12 months?: none How many times?: 0 Other Self Harm Risks: none Triggers for Past Attempts: None known Intentional Self Injurious Behavior: None Risk to Others: Homicidal Ideation: No Thoughts of Harm to Others: No Current Homicidal Intent: No Current Homicidal Plan: No Access to Homicidal Means: No Identified Victim: none History of harm to others?: No Assessment of Violence: None Noted Violent Behavior Description: none Does patient have access to weapons?: No Criminal Charges Pending?: No Does patient have a court date: No Prior Inpatient Therapy: Prior Inpatient Therapy: No Prior Outpatient Therapy: Prior Outpatient Therapy: Yes Prior Therapy Dates: [place] Prior Therapy Facilty/Provider(s): [facility] Reason for Treatment: depression Does patient have an ACCT team?:  No Does patient have Intensive In-House Services?  : No Does patient have [facility] services? :  No Does patient have P4CC services?: No  Past Medical History:  Past Medical History:  Diagnosis Date  . Arthritis   . Cancer [facility]) 2004   right breast ca  . Diabetes mellitus without complication ([place])   . Hypercholesterolemia   . Hypertension   . Hypothyroid   . Seasonal allergies     Past Surgical History:  Procedure Laterality Date  . ABDOMINAL HYSTERECTOMY    . BREAST BIOPSY Left    neg  . BREAST EXCISIONAL BIOPSY Right 2004   breast ca and lumpectomy  . BREAST LUMPECTOMY Right 2004   breast ca  . CHOLECYSTECTOMY    . COLONOSCOPY WITH PROPOFOL N/A 02/13/2017   Procedure: COLONOSCOPY WITH PROPOFOL;  Surgeon: Jonathon Bellows, MD;  Location: [facility] ENDOSCOPY;  Service: Gastroenterology;  Laterality: N/A;  . KIDNEY STONE SURGERY     Family History:  Family History  Problem Relation Age of Onset  . Bladder Cancer Neg Hx   . Kidney cancer Neg Hx    Family Psychiatric  History: Positive for mood disorder Social History:  Social History   Substance and Sexual Activity  Alcohol Use No     Social History   Substance and Sexual Activity  Drug Use No    Social History   Socioeconomic History  . Marital status: Divorced    Spouse name: Not on file  . Number of children: Not on file  . Years of education: Not on file  . Highest education level: Not on file  Occupational History  . Not on file  Social Needs  . Financial resource strain: Not on file  . Food insecurity:    Worry: Not on file    Inability: Not on file  . Transportation needs:    Medical: Not on file    Non-medical: Not on file  Tobacco Use  . Smoking status: Former Smoker    Types: Cigarettes  . Smokeless tobacco: Never Used  Substance and Sexual Activity  . Alcohol use: No  . Drug use: No  . Sexual activity: Not Currently  Lifestyle  . Physical activity:    Days per week: Not on file    Minutes per session: Not on file  . Stress: Not on file  Relationships  . Social connections:    Talks  on phone: Not on file    Gets together: Not on file    Attends religious service: Not on file    Active member of club or organization: Not on file    Attends meetings of clubs or organizations: Not on file    Relationship status: Not on file  Other Topics Concern  . Not on file  Social History Narrative  . Not on file   Additional Social History:    Allergies:   Allergies  Allergen Reactions  . Navane [Thiothixene] Other (See Comments)    Reaction:  Unknown  . Penicillins Rash    Has patient had a PCN reaction causing immediate rash, facial/tongue/throat swelling, SOB or lightheadedness with hypotension: No Has patient had a PCN reaction causing severe rash involving mucus membranes or skin necrosis: No Has patient had a PCN reaction that required hospitalization: No Has patient had a PCN reaction occurring within the last 10 years: No If all of the above answers are "NO", then may proceed with Cephalosporin use.     Labs: No results found for this  or any previous visit (from the past 48 hour(s)).  Current Facility-Administered Medications  Medication Dose Route Frequency Provider Last Rate Last Dose  . aspirin EC tablet 81 mg  81 mg Oral Daily Clapacs, Madie Reno, MD   81 mg at 02/02/18 1523  . clonazePAM (KLONOPIN) tablet 1 mg  1 mg Oral QHS Clapacs, John T, MD      . enalapril (VASOTEC) tablet 20 mg  20 mg Oral Daily Clapacs, Madie Reno, MD   20 mg at 02/02/18 1552  . FLUoxetine (PROZAC) capsule 20 mg  20 mg Oral Daily Clapacs, Madie Reno, MD   20 mg at 02/02/18 1523  . glipiZIDE (GLUCOTROL XL) 24 hr tablet 5 mg  5 mg Oral BID Clapacs, John T, MD      . Derrill Memo ON 02/03/2018] levothyroxine (SYNTHROID, LEVOTHROID) tablet 75 mcg  75 mcg Oral QAC breakfast Clapacs, John T, MD      . loratadine (CLARITIN) tablet 10 mg  10 mg Oral Daily Clapacs, Madie Reno, MD   10 mg at 02/02/18 1523  . simvastatin (ZOCOR) tablet 20 mg  20 mg Oral q1800 Clapacs, John T, MD      . traZODone (DESYREL) tablet 100  mg  100 mg Oral QHS Clapacs, Madie Reno, MD       Current Outpatient Medications  Medication Sig Dispense Refill  . acetaminophen (TYLENOL) 500 MG tablet Take 500 mg by mouth every 6 (six) hours as needed.    Marland Kitchen aspirin 81 MG EC tablet Take 1 tablet (81 mg total) by mouth daily. Swallow whole. 30 tablet 12  . cetirizine (ZYRTEC) 10 MG tablet Take 10 mg by mouth daily.    . clonazePAM (KLONOPIN) 1 MG tablet Take 1 tablet (1 mg total) by mouth at bedtime. 30 tablet 0  . enalapril (VASOTEC) 20 MG tablet Take 1 tablet (20 mg total) by mouth 2 (two) times daily. 30 tablet 0  . glipiZIDE (GLUCOTROL XL) 5 MG 24 hr tablet Take 5 mg by mouth 2 (two) times daily.     Marland Kitchen levothyroxine (SYNTHROID, LEVOTHROID) 75 MCG tablet Take 1 tablet (75 mcg total) by mouth every morning. 30 tablet 0  . Olopatadine HCl 0.2 % SOLN Place 1 drop into both eyes daily.     . simvastatin (ZOCOR) 20 MG tablet Take 1 tablet (20 mg total) by mouth daily. 30 tablet 0  . traZODone (DESYREL) 100 MG tablet Take 1 tablet (100 mg total) by mouth at bedtime. 30 tablet 0  . FLUoxetine (PROZAC) 20 MG capsule Take 1 capsule (20 mg total) by mouth daily. (Patient not taking: Reported on 02/02/2018) 30 capsule 3  . lidocaine (LIDODERM) 5 % Place 1 patch onto the skin every 12 (twelve) hours. Remove & Discard patch within 12 hours or as directed by MD 10 patch 0  . traMADol (ULTRAM) 50 MG tablet Take 1 tablet (50 mg total) by mouth every 6 (six) hours as needed. (Patient not taking: Reported on 02/02/2018) 10 tablet 0    Musculoskeletal: Strength & Muscle Tone: within normal limits Gait & Station: normal Patient leans: N/A  Psychiatric Specialty Exam: Physical Exam  Nursing note and vitals reviewed. Constitutional: She appears well-developed and well-nourished.  HENT:  Head: Normocephalic and atraumatic.  Eyes: Pupils are equal, round, and reactive to light. Conjunctivae are normal.  Neck: Normal range of motion.  Cardiovascular: Regular  rhythm and normal heart sounds.  Respiratory: Effort normal. No respiratory distress.  GI: Soft.  Musculoskeletal:  Normal range of motion.  Neurological: She is alert.  Skin: Skin is warm and dry.  Psychiatric: Her affect is labile. Her speech is tangential. She is agitated. She is not aggressive. Thought content is paranoid. Cognition and memory are impaired. She expresses impulsivity. She expresses suicidal ideation.    Review of Systems  Constitutional: Negative.   HENT: Negative.   Eyes: Negative.   Respiratory: Negative.   Cardiovascular: Negative.   Gastrointestinal: Negative.   Musculoskeletal: Negative.   Skin: Negative.   Neurological: Negative.   Psychiatric/Behavioral: Positive for depression, memory loss and suicidal ideas. Negative for hallucinations and substance abuse. The patient is nervous/anxious and has insomnia.     Blood pressure (!) 165/132, pulse 66, temperature 98.8 F (37.1 C), temperature source Oral, resp. rate 20, height 5' (1.524 m), weight 78.5 kg, SpO2 95 %.Body mass index is 33.79 kg/m.  General Appearance: Casual  Eye Contact:  Good  Speech:  Garbled and Pressured  Volume:  Increased  Mood:  Anxious, Depressed and Hopeless  Affect:  Labile  Thought Process:  Disorganized  Orientation:  Full (Time, Place, and Person)  Thought Content:  Illogical, Paranoid Ideation, Rumination and Tangential  Suicidal Thoughts:  Yes.  without intent/plan  Homicidal Thoughts:  No  Memory:  Immediate;   Fair Recent;   Poor Remote;   Fair  Judgement:  Impaired  Insight:  Shallow  Psychomotor Activity:  Decreased  Concentration:  Concentration: Poor  Recall:  [employer] of Knowledge:  Fair  Language:  Fair  Akathisia:  No  Handed:  Right  AIMS (if indicated):     Assets:  Desire for Improvement Housing  ADL's:  Impaired  Cognition:  Impaired,  Mild  Sleep:        Treatment Plan Summary: Daily contact with patient to assess and evaluate symptoms and  progress in treatment, Medication management and Plan 72 year old woman with PTSD and bipolar disorder who is disorganized in her thinking agitated showing poor self-care probably having psychotic thinking.  Patient is decompensating and has minimal resources for coping outside the hospital.  Because of psychosis and serious mental illness patient should be admitted to the psychiatric ward for stabilization.  She agrees to the plan.  Restarted all of her medicines and added back to Prozac which had been helpful in the past.  15-minute checks.  Labs will be obtained as well as EKG.  Case reviewed with ER doctor.  Disposition: Recommend psychiatric Inpatient admission when medically cleared. Supportive therapy provided about ongoing stressors.  Alethia Berthold, MD 02/02/2018 4:34 PM

## 2018-02-03 DIAGNOSIS — F319 Bipolar disorder, unspecified: Secondary | ICD-10-CM

## 2018-02-03 LAB — LIPID PANEL
CHOL/HDL RATIO: 4.6 ratio
Cholesterol: 217 mg/dL — ABNORMAL HIGH (ref 0–200)
HDL: 47 mg/dL (ref >40)
LDL Cholesterol: 108 mg/dL — ABNORMAL HIGH (ref 0–99)
Triglycerides: 310 mg/dL — ABNORMAL HIGH (ref <150)
VLDL: 62 mg/dL — ABNORMAL HIGH (ref 0–40)

## 2018-02-03 LAB — URINALYSIS, COMPLETE (UACMP) WITH MICROSCOPIC
Bacteria, UA: NONE SEEN
Bilirubin Urine: NEGATIVE
Glucose, UA: NEGATIVE mg/dL
Hgb urine dipstick: NEGATIVE
KETONES UR: NEGATIVE mg/dL
Leukocytes, UA: NEGATIVE
Nitrite: NEGATIVE
PH: 5 (ref 5.0–8.0)
PROTEIN: NEGATIVE mg/dL
Specific Gravity, Urine: 1.003 — ABNORMAL LOW (ref 1.005–1.030)

## 2018-02-03 LAB — GLUCOSE, CAPILLARY
GLUCOSE-CAPILLARY: 157 mg/dL — AB (ref 70–99)
Glucose-Capillary: 139 mg/dL — ABNORMAL HIGH (ref 70–99)
Glucose-Capillary: 100 mg/dL — ABNORMAL HIGH (ref 70–99)
Glucose-Capillary: 137 mg/dL — ABNORMAL HIGH (ref 70–99)

## 2018-02-03 LAB — TSH: TSH: 3.028 u[IU]/mL (ref 0.350–4.500)

## 2018-02-03 MED ORDER — BENZONATATE 100 MG PO CAPS
200.0000 mg | ORAL_CAPSULE | Freq: Three times a day (TID) | ORAL | Status: DC
  Administered 2018-02-03 – 2018-02-08 (×14): 200 mg via ORAL
  Filled 2018-02-03 (×20): qty 2

## 2018-02-03 MED ORDER — GUAIFENESIN 100 MG/5ML PO SOLN
5.0000 mL | ORAL | Status: DC | PRN
  Filled 2018-02-03: qty 5

## 2018-02-03 NOTE — Progress Notes (Deleted)
Recreation Therapy Notes  Date: 02/03/2018  Time: 9:30 pm   Location: Craft Room   Behavioral response: N/A   Intervention Topic: Happiness  Discussion/Intervention: Patient did not attend group.   Clinical Observations/Feedback:  Patient did not attend group.   Sarita Hakanson LRT/CTRS        R[place]ge [place] 02/03/2018 12:48 PM

## 2018-02-03 NOTE — Progress Notes (Signed)
Patient pleasant for the most of the day compliant with medications. Patient became upset due to pharmacy not delivering some medication on time. Nurse collected urine sample and sent to lab. Patient remains safe on the unit.

## 2018-02-03 NOTE — [facility] Suicide Risk Assessment (Signed)
[name] Admission Suicide Risk Assessment   Nursing information obtained from:  Patient Demographic factors:  Living alone, Caucasian, Unemployed Current Mental Status:  NA Loss Factors:  NA Historical Factors:  NA Risk Reduction Factors:  NA  Total Time spent with patient: 1 hour Principal Problem: <principal problem not specified> Diagnosis:   Patient Active Problem List   Diagnosis Date Noted  . Bipolar I disorder, most recent episode depressed with anxious distress ([place]) [F31.9] 07/07/2017    Priority: High  . Bipolar I disorder, current or most recent episode manic, severe with mixed features ([place]) [F31.13] 09/20/2014    Priority: High  . PTSD (post-traumatic stress disorder) [F43.10] 07/07/2017  . Adjustment disorder with mixed disturbance of emotions and conduct [F43.25] 10/11/2016  . Diabetes ([place]) [E11.9] 09/23/2014  . Essential hypertension [I10] 09/21/2014  . Hypothyroidism [E03.9] 09/21/2014  . Dyslipidemia [E78.5] 09/21/2014   Subjective Data: suicidal ideation  Continued Clinical Symptoms:  Alcohol Use Disorder Identification Test Final Score (AUDIT): 0 The "Alcohol Use Disorders Identification Test", Guidelines for Use in Primary Care, Second Edition.  World Pharmacologist [facility]). Score between 0-7:  no or low risk or alcohol related problems. Score between 8-15:  moderate risk of alcohol related problems. Score between 16-19:  high risk of alcohol related problems. Score 20 or above:  warrants further diagnostic evaluation for alcohol dependence and treatment.   CLINICAL FACTORS:   Bipolar Disorder:   Depressive phase Depression:   Insomnia Severe   Musculoskeletal: Strength & Muscle Tone: within normal limits Gait & Station: normal Patient leans: N/A  Psychiatric Specialty Exam: Physical Exam  Nursing note and vitals reviewed. Psychiatric: Her speech is normal and behavior is normal. Cognition and memory are normal. She expresses impulsivity. She  exhibits a depressed mood. She expresses suicidal ideation. She expresses suicidal plans.    Review of Systems  Neurological: Negative.   Psychiatric/Behavioral: Positive for depression and suicidal ideas. The patient is nervous/anxious and has insomnia.   All other systems reviewed and are negative.   Blood pressure 119/74, pulse 72, temperature 98.4 F (36.9 C), temperature source Oral, resp. rate 20, height 5' (1.524 m), weight 76.7 kg, SpO2 97 %.Body mass index is 33.01 kg/m.  General Appearance: Casual  Eye Contact:  Good  Speech:  Clear and Coherent  Volume:  Normal  Mood:  Depressed  Affect:  Flat  Thought Process:  Goal Directed and Descriptions of Associations: Intact  Orientation:  Full (Time, Place, and Person)  Thought Content:  WDL  Suicidal Thoughts:  Yes.  with intent/plan  Homicidal Thoughts:  No  Memory:  Immediate;   Fair Recent;   Fair Remote;   Fair  Judgement:  Impaired  Insight:  Present  Psychomotor Activity:  Normal  Concentration:  Concentration: Fair and Attention Span: Fair  Recall:  [employer] of Knowledge:  Fair  Language:  Fair  Akathisia:  No  Handed:  Right  AIMS (if indicated):     Assets:  Communication Skills Desire for Improvement Financial Resources/Insurance Housing Physical Health Resilience  ADL's:  Intact  Cognition:  WNL  Sleep:  Number of Hours: 7.25      COGNITIVE FEATURES THAT CONTRIBUTE TO RISK:  None    SUICIDE RISK:   Severe:  Frequent, intense, and enduring suicidal ideation, specific plan, no subjective intent, but some objective markers of intent (i.e., choice of lethal method), the method is accessible, some limited preparatory behavior, evidence of impaired self-control, severe dysphoria/symptomatology, multiple risk factors present,  and few if any protective factors, particularly a lack of social support.  PLAN OF CARE: hospital admission, medication management, discharge planning.  [name] is a  72 year old female with a history of severe depression admitted for worsening depression and suicidal ideation.  #Suicidal ideation -patient able to contract for safety in the hospital  #Mood -continue Seroquel 100 mg nightly -continue Prozac 20 mg daily  #Insomnia -continue Trazodone 100 mg, Clonazepam 1 mg and Seroquel 100 mg nightly  #Hypothyroidism  -continue Synthroid 75 ucg daily  #[place]augh -Tessalon and Robitussin PRN  #HTN -Vasotec 20 mg daily  #Dyslipidemia -Simvastatin 20 mg daily  #Labs -lipid panel, TSH, A1C -EKG  #Disposition -discharge to her apartment -follow up with [facility]  I certify that inpatient services furnished can reasonably be expected to improve the patient's condition.   Orson Slick, MD 02/03/2018, 4:44 PM

## 2018-02-03 NOTE — Progress Notes (Signed)
Patient ID: [name], female   DOB: 1945-11-15, 73 y.o.   MRN: [id number] Per State regulations 482.30 this chart was reviewed for medical necessity with respect to the patient's admission/duration of stay.    Next review date: 02/06/18  Debarah Crape, BSN, RN-BC  Case Manager

## 2018-02-03 NOTE — Plan of Care (Signed)
Patient is alert and oriented. Patient denies SI, HI and AVH. Patient complains of having gas or indigestion pains therefore Maalox was given to help with indigestion. Patient complains of restless sleep last night. Blood pressure 119/77 this morning. Patient can be labile pleasant one moment cursing the next. Patient states she did not eat breakfast this morning due to indigestion from last night and would like to rest this morning. 15 minute checks will continue; Patient verbalizes understanding of medication. Nurse will continue to monitor. Problem: Coping: Goal: Coping ability will improve Outcome: Progressing Goal: Will verbalize feelings Outcome: Progressing   Problem: Education: Goal: Utilization of techniques to improve thought processes will improve Outcome: Progressing Goal: Knowledge of the prescribed therapeutic regimen will improve Outcome: Progressing   Problem: Activity: Goal: Interest or engagement in leisure activities will improve Outcome: Not Progressing Goal: Imbalance in normal sleep/wake cycle will improve Outcome: Progressing
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
## 2018-02-03 NOTE — BHH Suicide Risk Assessment (Signed)
[place] INPATIENT:  Family/Significant Other Suicide Prevention Education  Suicide Prevention Education:  Patient Refusal for Family/Significant Other Suicide Prevention Education: The patient [name] has refused to provide written consent for family/significant other to be provided Family/Significant Other Suicide Prevention Education during admission and/or prior to discharge.  Physician notified.  Darin Engels 02/03/2018, 10:39 AM

## 2018-02-03 NOTE — BHH Group Notes (Signed)
[facility] Group Notes:  (Nursing/MHT/Case Management/Adjunct)  Date:  02/03/2018  Time:  11:00 PM  Type of Therapy:  Group Therapy  Participation Level:  Did Not Attend   Barnie Mort 02/03/2018, 11:00 PM
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
## 2018-02-03 NOTE — Progress Notes (Signed)
Recreation Therapy Notes  Date: 02/03/2018  Time: 9:30 pm   Location: Craft Room   Behavioral response: N/A   Intervention Topic: Happiness  Discussion/Intervention: Patient did not attend group.   Clinical Observations/Feedback:  Patient did not attend group.   Deneisha Dade LRT/CTRS        Elvie Maines 02/03/2018 12:42 PM

## 2018-02-03 NOTE — Progress Notes (Signed)
Recreation Therapy Notes  INPATIENT RECREATION THERAPY ASSESSMENT  Patient Details Name: [name] MRN: [id number] DOB: 03-Jul-1945 Today's Date: 02/03/2018       Information Obtained From: Patient  Able to Participate in Assessment/Interview: Yes  Patient Presentation: Responsive  Reason for Admission (Per Patient): Other (Comments)(Depressed)  Patient Stressors:    Coping Skills:   Read  Leisure Interests (2+):  Individual - Reading, Games - Cross-word, Games - Music therapist, Exercise - Walking  Frequency of Recreation/Participation: Financial risk analyst Resources:     [employer]:     Current Use:    If no, Barriers?:    Expressed Interest in [place] of Residence:  Insurance underwriter  Patient Main Form of Transportation: Diplomatic Services operational officer  Patient Strengths:  "I'm just good"  Patient Identified Areas of Improvement:  my depression  Patient Goal for Hospitalization:  Improve my depression  Current SI (including self-harm):  No  Current HI:  No  Current AVH: No  Staff Intervention Plan: Collaborate with Interdisciplinary Treatment Team, Group Attendance  Consent to Intern Participation: N/A  Mellody Masri 02/03/2018, 1:50 PM

## 2018-02-03 NOTE — [facility] Group Notes (Signed)
02/03/2018 1PM  Type of Therapy/Topic:  Group Therapy:  Feelings about Diagnosis  Participation Level:  Active   Description of Group:   This group will allow patients to explore their thoughts and feelings about diagnoses they have received. Patients will be guided to explore their level of understanding and acceptance of these diagnoses. Facilitator will encourage patients to process their thoughts and feelings about the reactions of others to their diagnosis and will guide patients in identifying ways to discuss their diagnosis with significant others in their lives. This group will be process-oriented, with patients participating in exploration of their own experiences, giving and receiving support, and processing challenge from other group members.   Therapeutic Goals: 1. Patient will demonstrate understanding of diagnosis as evidenced by identifying two or more symptoms of the disorder 2. Patient will be able to express two feelings regarding the diagnosis 3. Patient will demonstrate their ability to communicate their needs through discussion and/or role play  Summary of Patient Progress: Actively and appropriately engaged in the group. Patient was able to provide support and validation to other group members.Patient practiced active listening when interacting with the facilitator and other group members. Tru spoke about her overthinking situations. She reports "I've always been a listener when communicating. I've been working on speaking more in my treatment, but sometimes its hard for me to recieve the help I need." Patient is still in the process of obtaining treatment goals.        Therapeutic Modalities:   Cognitive Behavioral Therapy Brief Therapy Feelings Identification    Darin Engels, Marlinda Mike 02/03/2018 2:32 PM

## 2018-02-03 NOTE — Progress Notes (Signed)
Nursing note 7p-7a  Pt observed interacting with peers on unit this shift. Displayed a anxious affect and labile mood upon interaction with this Probation officer. Pt was very irritable during the assessment and went from cursing to crying about her relationship with her sister, to laughing then back to cursing. Pt denies pain ,denies SI/HI, and also denies any audio or visual hallucinations at this time. Pt is able to verbally contract for safety with this RN. Goal: " to find out my fucking healthcare, nobody tells me shit" Pt received Vitaril prn as ordered by MD for anxiety and was able to calm down. See Mar for prn medication administration. Pt denies further need. Pt is now resting in bed with eyes closed, with no signs or symptoms of pain or distress noted. Pt continues to remain safe on the unit and is observed by rounding every 15 min. RN will continue to monitor.

## 2018-02-04 LAB — GLUCOSE, CAPILLARY
GLUCOSE-CAPILLARY: 99 mg/dL (ref 70–99)
Glucose-Capillary: 159 mg/dL — ABNORMAL HIGH (ref 70–99)

## 2018-02-04 LAB — HEMOGLOBIN A1C
Hgb A1c MFr Bld: 8.2 % — ABNORMAL HIGH (ref 4.8–5.6)
Mean Plasma Glucose: 189 mg/dL

## 2018-02-04 NOTE — Progress Notes (Signed)
Refused to attend therapy. No behavior issues noted. Complaint with her medications this shift. Denied any suicidal ideation. No c/o pain. Will continue to monitor every 15 minutes.

## 2018-02-04 NOTE — Plan of Care (Signed)
In the milieu, pleasant and cooperative

## 2018-02-04 NOTE — Plan of Care (Signed)
Patient is alert and oriented, denies SI, HI and AVH. Patient complains of restlessness and being woken up by staff. Patient compliant with medications. Patient pleasant one moment and argumentative the next. No self injurious behaviors noted. Problem: Coping: Goal: Coping ability will improve Outcome: Progressing Goal: Will verbalize feelings Outcome: Progressing   Problem: Education: Goal: Utilization of techniques to improve thought processes will improve Outcome: Progressing Goal: Knowledge of the prescribed therapeutic regimen will improve Outcome: Progressing   Problem: Education: Goal: Knowledge of the prescribed therapeutic regimen will improve Outcome: Progressing   Problem: Coping: Goal: Coping ability will improve Outcome: Progressing Goal: Will verbalize feelings Outcome: Progressing

## 2018-02-04 NOTE — B[facility] Group Notes (Signed)
LCSW Group Therapy Note  02/04/2018 1:00 pm  Type of Therapy/Topic:  Group Therapy:  Emotion Regulation  Participation Level:  Active   Description of Group:    The purpose of this group is to assist patients in learning to regulate negative emotions and experience positive emotions. Patients will be guided to discuss ways in which they have been vulnerable to their negative emotions. These vulnerabilities will be juxtaposed with experiences of positive emotions or situations, and patients will be challenged to use positive emotions to combat negative ones. Special emphasis will be placed on coping with negative emotions in conflict situations, and patients will process healthy conflict resolution skills.  Therapeutic Goals: 1. Patient will identify two positive emotions or experiences to reflect on in order to balance out negative emotions 2. Patient will label two or more emotions that they find the most difficult to experience 3. Patient will demonstrate positive conflict resolution skills through discussion and/or role plays  Summary of Patient Progress:   [name] actively participated in today's group discussion on emotion regulation.  [name] shared that fear and suspicion are two emotions that she finds most difficult to experience.  She shared that she has been successful at times working through her negative emotions and depression by reading, cooking, doing word puzzles, crocheting, but these things aren't always helpful.  [name] shared that it is when coping strategies don't work that she has to come to the hospital and get her medication adjusted.      Therapeutic Modalities:   Cognitive Behavioral Therapy Feelings Identification Dialectical Behavioral Therapy

## 2018-02-04 NOTE — Progress Notes (Signed)
[facility] MD Progress Note  02/04/2018 1:29 PM [name]  MRN:  [id number]  Subjective:    [name] met with treatment team this morning. Her yesterday's cheerful disposition is gone. She is extremely irritable, angry snf rude. She jumped out of the room when she has learned that hospital chaplain was present in the meeting screaming that she is "an Engineer, maintenance". She accepted medications yesterday and, luckily, she did not have diarrhea from Prozac or muscle twitches from or sedation from Seroquel. She agrees to continue both medications. She is disorganized in her thinking and unable to state the goal for this hospitalization.  Principal Problem: Bipolar I disorder, most recent episode depressed with anxious distress ([place]) Diagnosis:   Patient Active Problem List   Diagnosis Date Noted  . Bipolar I disorder, most recent episode depressed with anxious distress ([place]) [F31.9] 07/07/2017    Priority: High  . Bipolar I disorder, current or most recent episode manic, severe with mixed features ([place]) [F31.13] 09/20/2014    Priority: High  . PTSD (post-traumatic stress disorder) [F43.10] 07/07/2017  . Adjustment disorder with mixed disturbance of emotions and conduct [F43.25] 10/11/2016  . Diabetes ([place]) [E11.9] 09/23/2014  . Essential hypertension [I10] 09/21/2014  . Hypothyroidism [E03.9] 09/21/2014  . Dyslipidemia [E78.5] 09/21/2014   Total Time spent with patient: 20 minutes  Past Psychiatric History: bipolar disorder  Past Medical History:  Past Medical History:  Diagnosis Date  . Arthritis   . Cancer [facility]) 2004   right breast ca  . Diabetes mellitus without complication ([place])   . Hypercholesterolemia   . Hypertension   . Hypothyroid   . Seasonal allergies     Past Surgical History:  Procedure Laterality Date  . ABDOMINAL HYSTERECTOMY    . BREAST BIOPSY Left    neg  . BREAST EXCISIONAL BIOPSY Right 2004   breast ca and lumpectomy  . BREAST LUMPECTOMY Right 2004    breast ca  . CHOLECYSTECTOMY    . COLONOSCOPY WITH PROPOFOL N/A 02/13/2017   Procedure: COLONOSCOPY WITH PROPOFOL;  Surgeon: Jonathon Bellows, MD;  Location[facility] ENDOSCOPY;  Service: Gastroenterology;  Laterality: N/A;  . KIDNEY STONE SURGERY     Family History:  Family History  Problem Relation Age of Onset  . Bladder Cancer Neg Hx   . Kidney cancer Neg Hx    Family Psychiatric  History: multiple family members with mental illness Social History:  Social History   Substance and Sexual Activity  Alcohol Use No     Social History   Substance and Sexual Activity  Drug Use No    Social History   Socioeconomic History  . Marital status: Divorced    Spouse name: Not on file  . Number of children: Not on file  . Years of education: Not on file  . Highest education level: Not on file  Occupational History  . Not on file  Social Needs  . Financial resource strain: Not on file  . Food insecurity:    Worry: Not on file    Inability: Not on file  . Transportation needs:    Medical: Not on file    Non-medical: Not on file  Tobacco Use  . Smoking status: Former Smoker    Types: Cigarettes  . Smokeless tobacco: Never Used  Substance and Sexual Activity  . Alcohol use: No  . Drug use: No  . Sexual activity: Not Currently  Lifestyle  . Physical activity:    Days per week: Not on file  Minutes per session: Not on file  . Stress: Not on file  Relationships  . Social connections:    Talks on phone: Not on file    Gets together: Not on file    Attends religious service: Not on file    Active member of club or organization: Not on file    Attends meetings of clubs or organizations: Not on file    Relationship status: Not on file  Other Topics Concern  . Not on file  Social History Narrative  . Not on file   Additional Social History:                         Sleep: Fair  Appetite:  Fair  Current Medications: Current Facility-Administered Medications   Medication Dose Route Frequency Provider Last Rate Last Dose  . acetaminophen (TYLENOL) tablet 650 mg  650 mg Oral Q6H PRN Clapacs, Madie Reno, MD   650 mg at 02/03/18 1839  . alum & mag hydroxide-simeth (MAALOX/MYLANTA) 200-200-20 MG/5ML suspension 30 mL  30 mL Oral Q4H PRN Clapacs, Madie Reno, MD   30 mL at 02/03/18 0825  . aspirin EC tablet 81 mg  81 mg Oral Daily Clapacs, Madie Reno, MD   81 mg at 02/04/18 0836  . benzonatate (TESSALON) capsule 200 mg  200 mg Oral TID Pucilowska, Jolanta B, MD   200 mg at 02/04/18 1218  . clonazePAM (KLONOPIN) tablet 1 mg  1 mg Oral QHS Clapacs, Madie Reno, MD   1 mg at 02/03/18 2157  . enalapril (VASOTEC) tablet 20 mg  20 mg Oral Daily Clapacs, Madie Reno, MD   20 mg at 02/04/18 0836  . FLUoxetine (PROZAC) capsule 20 mg  20 mg Oral Daily Clapacs, Madie Reno, MD   20 mg at 02/04/18 0836  . glipiZIDE (GLUCOTROL XL) 24 hr tablet 5 mg  5 mg Oral BID Clapacs, Madie Reno, MD   5 mg at 02/04/18 0836  . guaiFENesin (ROBITUSSIN) 100 MG/5ML solution 100 mg  5 mL Oral Q4H PRN Pucilowska, Jolanta B, MD      . hydrOXYzine (ATARAX/VISTARIL) tablet 25 mg  25 mg Oral TID PRN Clapacs, Madie Reno, MD   25 mg at 02/02/18 2000  . levothyroxine (SYNTHROID, LEVOTHROID) tablet 75 mcg  75 mcg Oral QAC breakfast Clapacs, Madie Reno, MD   75 mcg at 02/04/18 0837  . loratadine (CLARITIN) tablet 10 mg  10 mg Oral Daily Clapacs, Madie Reno, MD   10 mg at 02/04/18 0840  . magnesium hydroxide (MILK OF MAGNESIA) suspension 30 mL  30 mL Oral Daily PRN Clapacs, John T, MD      . QUEtiapine (SEROQUEL) tablet 100 mg  100 mg Oral QHS Pucilowska, Jolanta B, MD   100 mg at 02/03/18 2157  . simvastatin (ZOCOR) tablet 20 mg  20 mg Oral q1800 Clapacs, Madie Reno, MD   20 mg at 02/03/18 1657  . traZODone (DESYREL) tablet 100 mg  100 mg Oral QHS Clapacs, Madie Reno, MD   100 mg at 02/03/18 2157    Lab Results:  Results for orders placed or performed during the hospital encounter of 02/02/18 (from the past 48 hour(s))  Urinalysis, Complete w  Microscopic     Status: Abnormal   Collection Time: 02/02/18  6:58 PM  Result Value Ref Range   Color, Urine COLORLESS (A) YELLOW   APPearance CLEAR (A) CLEAR   Specific Gravity, Urine 1.003 (L) 1.005 - 1.030  pH 5.0 5.0 - 8.0   Glucose, UA NEGATIVE NEGATIVE mg/dL   Hgb urine dipstick NEGATIVE NEGATIVE   Bilirubin Urine NEGATIVE NEGATIVE   Ketones, ur NEGATIVE NEGATIVE mg/dL   Protein, ur NEGATIVE NEGATIVE mg/dL   Nitrite NEGATIVE NEGATIVE   Leukocytes, UA NEGATIVE NEGATIVE   RBC / HPF 0-5 0 - 5 RBC/hpf   WBC, UA 0-5 0 - 5 WBC/hpf   Bacteria, UA NONE SEEN NONE SEEN   Squamous Epithelial / LPF 0-5 0 - 5   Mucus PRESENT     Comment: Performed at [facility], [address] [place], [place] [address]  CBC     Status: None   Collection Time: 02/02/18  7:26 PM  Result Value Ref Range   WBC 8.8 3.6 - 11.0 K/uL   RBC 4.77 3.80 - 5.20 MIL/uL   Hemoglobin 14.8 12.0 - 16.0 g/dL   HCT 42.8 35.0 - 47.0 %   MCV 89.7 80.0 - 100.0 fL   MCH 30.9 26.0 - 34.0 pg   MCHC 34.5 32.0 - 36.0 g/dL   RDW 13.0 11.5 - 14.5 %   Platelets 306 150 - 440 K/uL    Comment: Performed at [facility], [address] [place], [place] [address]  Comprehensive metabolic panel     Status: Abnormal   Collection Time: 02/02/18  7:26 PM  Result Value Ref Range   Sodium 139 135 - 145 mmol/L   Potassium 4.0 3.5 - 5.1 mmol/L   Chloride 108 98 - 111 mmol/L   CO2 23 22 - 32 mmol/L   Glucose, Bld 167 (H) 70 - 99 mg/dL   BUN 26 (H) 8 - 23 mg/dL   Creatinine, Ser 1.32 (H) 0.44 - 1.00 mg/dL   Calcium 9.7 8.9 - 10.3 mg/dL   Total Protein 8.1 6.5 - 8.1 g/dL   Albumin 4.6 3.5 - 5.0 g/dL   AST 21 15 - 41 U/L   ALT 13 0 - 44 U/L   Alkaline Phosphatase 55 38 - 126 U/L   Total Bilirubin 0.6 0.3 - 1.2 mg/dL   GFR calc non Af Amer 39 (L) >60 mL/min   GFR calc Af Amer 45 (L) >60 mL/min    Comment: (NOTE) The eGFR has been calculated using the CKD EPI equation. This calculation has not been validated  in all clinical situations. eGFR's persistently <60 mL/min signify possible Chronic Kidney Disease.    Anion gap 8 5 - 15    Comment: Performed at [facility], [address] [place], [place] [address]  Glucose, capillary     Status: Abnormal   Collection Time: 02/03/18  7:00 AM  Result Value Ref Range   Glucose-Capillary 139 (H) 70 - 99 mg/dL   Comment 1 Notify RN   Glucose, capillary     Status: Abnormal   Collection Time: 02/03/18 11:05 AM  Result Value Ref Range   Glucose-Capillary 157 (H) 70 - 99 mg/dL   Comment 1 Notify RN   Glucose, capillary     Status: Abnormal   Collection Time: 02/03/18  4:13 PM  Result Value Ref Range   Glucose-Capillary 100 (H) 70 - 99 mg/dL   Comment 1 Notify RN   Glucose, capillary     Status: Abnormal   Collection Time: 02/03/18  8:37 PM  Result Value Ref Range   Glucose-Capillary 137 (H) 70 - 99 mg/dL   Comment 1 Notify RN   Glucose, capillary     Status:  Abnormal   Collection Time: 02/04/18  7:08 AM  Result Value Ref Range   Glucose-Capillary 159 (H) 70 - 99 mg/dL   Comment 1 Notify RN     Blood Alcohol level:  Lab Results  Component Value Date   ETH <10 07/07/2017   ETH <5 40/98/1191    Metabolic Disorder Labs: Lab Results  Component Value Date   HGBA1C 8.2 (H) 02/02/2018   MPG 189 02/02/2018   MPG 136.98 07/08/2017   No results found for: PROLACTIN Lab Results  Component Value Date   CHOL 217 (H) 02/02/2018   TRIG 310 (H) 02/02/2018   HDL 47 02/02/2018   CHOLHDL 4.6 02/02/2018   VLDL 62 (H) 02/02/2018   LDLCALC 108 (H) 02/02/2018   LDLCALC 94 07/08/2017    Physical Findings: AIMS: Facial and Oral Movements Muscles of Facial Expression: None, normal Lips and Perioral Area: None, normal Jaw: None, normal Tongue: None, normal,Extremity Movements Upper (arms, wrists, hands, fingers): None, normal Lower (legs, knees, ankles, toes): None, normal, Trunk Movements Neck, shoulders, hips: None, normal, Overall  Severity Severity of abnormal movements (highest score from questions above): None, normal Incapacitation due to abnormal movements: None, normal Patient's awareness of abnormal movements (rate only patient's report): No Awareness, Dental Status Current problems with teeth and/or dentures?: No Does patient usually wear dentures?: No  CIWA:    COWS:     Musculoskeletal: Strength & Muscle Tone: within normal limits Gait & Station: normal Patient leans: N/A  Psychiatric Specialty Exam: Physical Exam  Nursing note and vitals reviewed. Psychiatric: Her affect is angry, labile and inappropriate. Her speech is rapid and/or pressured. She is hyperactive. Thought content is paranoid. Cognition and memory are normal. She expresses impulsivity.    Review of Systems  Neurological: Negative.   Psychiatric/Behavioral: Negative.   All other systems reviewed and are negative.   Blood pressure (!) 142/81, pulse 60, temperature 98 F (36.7 C), temperature source Oral, resp. rate 16, height 5' (1.524 m), weight 76.7 kg, SpO2 93 %.Body mass index is 33.01 kg/m.  General Appearance: Casual  Eye Contact:  Good  Speech:  Clear and Coherent and Pressured  Volume:  Increased  Mood:  Angry, Dysphoric and Irritable  Affect:  Congruent  Thought Process:  Goal Directed and Descriptions of Associations: Intact  Orientation:  Full (Time, Place, and Person)  Thought Content:  WDL  Suicidal Thoughts:  No  Homicidal Thoughts:  No  Memory:  Immediate;   Fair Recent;   Fair Remote;   Fair  Judgement:  Poor  Insight:  Lacking  Psychomotor Activity:  Increased  Concentration:  Concentration: Fair and Attention Span: Fair  Recall:  [employer] of Knowledge:  Fair  Language:  Fair  Akathisia:  No  Handed:  Right  AIMS (if indicated):     Assets:  Communication Skills Desire for Improvement Financial Resources/Insurance Housing Physical Health Resilience  ADL's:  Intact  Cognition:  WNL  Sleep:   Number of Hours: 7.25     Treatment Plan Summary: Daily contact with patient to assess and evaluate symptoms and progress in treatment and Medication management   Ms. Zachow is a 72 year old female with a history of severe depression admitted for worsening depression and suicidal ideation.  #Suicidal ideation, resolved -patient able to contract for safety in the hospital  #Mood -continue Seroquel 100 mg nightly -continue Prozac 20 mg daily  #Insomnia -continue Trazodone 100 mg, Clonazepam 1 mg and Seroquel 100 mg nightly  #Hypothyroidism  -  continue Synthroid 75 ucg daily  #[place] -Tessalon and Robitussin PRN  #HTN -Vasotec 20 mg daily  #Dyslipidemia -Simvastatin 20 mg daily  #Labs -lipid panel shows elevated Chol and TG, TSH normal, A1C 8.2 -EKG  #Disposition -discharge to her apartment -follow up with [facility]  Orson Slick, MD 02/04/2018, 1:29 PM

## 2018-02-04 NOTE — Progress Notes (Addendum)
Patient slept for 6 hours and 30  minutes, no issues

## 2018-02-04 NOTE — Progress Notes (Signed)
D - Pt stated "she slept good". Pt denies SI/HI and AVH. Pt verbally contracts for safety. Pt states "she experiencing no pain." Pt has no complaints at this time. Pt is still thinking about her goal for the day. Pt's is pleasant and cooperative at this time.  A - Administer schedule medications as ordered, per MD. Safety rounds performed every 15 mins. Frequent verbal contact.  R - Pt remains safe at this time. No adverse drug reactions noted. Pt complaint with medication and treatment plan

## 2018-02-04 NOTE — Progress Notes (Signed)
[facility] MD Progress Note  02/05/2018 2:02 PM [name]  MRN:  [id number]  Subjective:   [name] is very "prickly" today and not engaged in the interview. She has second thoughts about her medications which she initially embraced enthusiastically.   Principal Problem: Bipolar I disorder, most recent episode depressed with anxious distress ([place]) Diagnosis:   Patient Active Problem List   Diagnosis Date Noted  . Bipolar I disorder, most recent episode depressed with anxious distress ([place]) [F31.9] 07/07/2017    Priority: High  . Bipolar I disorder, current or most recent episode manic, severe with mixed features ([place]) [F31.13] 09/20/2014    Priority: High  . PTSD (post-traumatic stress disorder) [F43.10] 07/07/2017  . Adjustment disorder with mixed disturbance of emotions and conduct [F43.25] 10/11/2016  . Diabetes ([place]) [E11.9] 09/23/2014  . Essential hypertension [I10] 09/21/2014  . Hypothyroidism [E03.9] 09/21/2014  . Dyslipidemia [E78.5] 09/21/2014   Total Time spent with patient: 20 minutes  Past Psychiatric History: bipolar disorder  Past Medical History:  Past Medical History:  Diagnosis Date  . Arthritis   . Cancer [facility]) 2004   right breast ca  . Diabetes mellitus without complication ([place])   . Hypercholesterolemia   . Hypertension   . Hypothyroid   . Seasonal allergies     Past Surgical History:  Procedure Laterality Date  . ABDOMINAL HYSTERECTOMY    . BREAST BIOPSY Left    neg  . BREAST EXCISIONAL BIOPSY Right 2004   breast ca and lumpectomy  . BREAST LUMPECTOMY Right 2004   breast ca  . CHOLECYSTECTOMY    . COLONOSCOPY WITH PROPOFOL N/A 02/13/2017   Procedure: COLONOSCOPY WITH PROPOFOL;  Surgeon: Jonathon Bellows, MD;  Location: [facility] ENDOSCOPY;  Service: Gastroenterology;  Laterality: N/A;  . KIDNEY STONE SURGERY     Family History:  Family History  Problem Relation Age of Onset  . Bladder Cancer Neg Hx   . Kidney cancer Neg Hx    Family  Psychiatric  History: bipolar disorder Social History:  Social History   Substance and Sexual Activity  Alcohol Use No     Social History   Substance and Sexual Activity  Drug Use No    Social History   Socioeconomic History  . Marital status: Divorced    Spouse name: Not on file  . Number of children: Not on file  . Years of education: Not on file  . Highest education level: Not on file  Occupational History  . Not on file  Social Needs  . Financial resource strain: Not on file  . Food insecurity:    Worry: Not on file    Inability: Not on file  . Transportation needs:    Medical: Not on file    Non-medical: Not on file  Tobacco Use  . Smoking status: Former Smoker    Types: Cigarettes  . Smokeless tobacco: Never Used  Substance and Sexual Activity  . Alcohol use: No  . Drug use: No  . Sexual activity: Not Currently  Lifestyle  . Physical activity:    Days per week: Not on file    Minutes per session: Not on file  . Stress: Not on file  Relationships  . Social connections:    Talks on phone: Not on file    Gets together: Not on file    Attends religious service: Not on file    Active member of club or organization: Not on file    Attends meetings of clubs or  organizations: Not on file    Relationship status: Not on file  Other Topics Concern  . Not on file  Social History Narrative  . Not on file   Additional Social History:                         Sleep: Fair  Appetite:  Fair  Current Medications: Current Facility-Administered Medications  Medication Dose Route Frequency Provider Last Rate Last Dose  . acetaminophen (TYLENOL) tablet 650 mg  650 mg Oral Q6H PRN Clapacs, Madie Reno, MD   650 mg at 02/04/18 2223  . alum & mag hydroxide-simeth (MAALOX/MYLANTA) 200-200-20 MG/5ML suspension 30 mL  30 mL Oral Q4H PRN Clapacs, Madie Reno, MD   30 mL at 02/03/18 0825  . aspirin EC tablet 81 mg  81 mg Oral Daily Clapacs, Madie Reno, MD   81 mg at 02/05/18  0835  . benzonatate (TESSALON) capsule 200 mg  200 mg Oral TID Bernise Sylvain B, MD   200 mg at 02/05/18 1240  . clonazePAM (KLONOPIN) tablet 1 mg  1 mg Oral QHS Clapacs, Madie Reno, MD   1 mg at 02/04/18 2152  . enalapril (VASOTEC) tablet 20 mg  20 mg Oral Daily Clapacs, Madie Reno, MD   20 mg at 02/05/18 0836  . FLUoxetine (PROZAC) capsule 20 mg  20 mg Oral Daily Clapacs, Madie Reno, MD   20 mg at 02/05/18 0835  . glipiZIDE (GLUCOTROL XL) 24 hr tablet 5 mg  5 mg Oral BID Clapacs, Madie Reno, MD   5 mg at 02/05/18 0836  . guaiFENesin (ROBITUSSIN) 100 MG/5ML solution 100 mg  5 mL Oral Q4H PRN Mahima Hottle B, MD      . hydrOXYzine (ATARAX/VISTARIL) tablet 25 mg  25 mg Oral TID PRN Clapacs, Madie Reno, MD   25 mg at 02/02/18 2000  . levothyroxine (SYNTHROID, LEVOTHROID) tablet 75 mcg  75 mcg Oral QAC breakfast Clapacs, Madie Reno, MD   75 mcg at 02/05/18 0835  . loratadine (CLARITIN) tablet 10 mg  10 mg Oral Daily Clapacs, Madie Reno, MD   10 mg at 02/05/18 0835  . magnesium hydroxide (MILK OF MAGNESIA) suspension 30 mL  30 mL Oral Daily PRN Clapacs, John T, MD      . QUEtiapine (SEROQUEL) tablet 100 mg  100 mg Oral QHS Darolyn Double B, MD   100 mg at 02/04/18 2152  . simvastatin (ZOCOR) tablet 20 mg  20 mg Oral q1800 Clapacs, Madie Reno, MD   20 mg at 02/04/18 1751  . traZODone (DESYREL) tablet 100 mg  100 mg Oral QHS Clapacs, John T, MD   100 mg at 02/04/18 2152    Lab Results:  Results for orders placed or performed during the hospital encounter of 02/02/18 (from the past 48 hour(s))  Glucose, capillary     Status: Abnormal   Collection Time: 02/03/18  4:13 PM  Result Value Ref Range   Glucose-Capillary 100 (H) 70 - 99 mg/dL   Comment 1 Notify RN   Glucose, capillary     Status: Abnormal   Collection Time: 02/03/18  8:37 PM  Result Value Ref Range   Glucose-Capillary 137 (H) 70 - 99 mg/dL   Comment 1 Notify RN   Glucose, capillary     Status: Abnormal   Collection Time: 02/04/18  7:08 AM  Result  Value Ref Range   Glucose-Capillary 159 (H) 70 - 99 mg/dL   Comment 1 Notify  RN   Glucose, capillary     Status: None   Collection Time: 02/04/18  8:27 PM  Result Value Ref Range   Glucose-Capillary 99 70 - 99 mg/dL   Comment 1 Notify RN   Glucose, capillary     Status: Abnormal   Collection Time: 02/05/18  7:00 AM  Result Value Ref Range   Glucose-Capillary 176 (H) 70 - 99 mg/dL   Comment 1 Notify RN     Blood Alcohol level:  Lab Results  Component Value Date   ETH <10 07/07/2017   ETH <5 99/83/3825    Metabolic Disorder Labs: Lab Results  Component Value Date   HGBA1C 8.2 (H) 02/02/2018   MPG 189 02/02/2018   MPG 136.98 07/08/2017   No results found for: PROLACTIN Lab Results  Component Value Date   CHOL 217 (H) 02/02/2018   TRIG 310 (H) 02/02/2018   HDL 47 02/02/2018   CHOLHDL 4.6 02/02/2018   VLDL 62 (H) 02/02/2018   LDLCALC 108 (H) 02/02/2018   LDLCALC 94 07/08/2017    Physical Findings: AIMS: Facial and Oral Movements Muscles of Facial Expression: None, normal Lips and Perioral Area: None, normal Jaw: None, normal Tongue: None, normal,Extremity Movements Upper (arms, wrists, hands, fingers): None, normal Lower (legs, knees, ankles, toes): None, normal, Trunk Movements Neck, shoulders, hips: None, normal, Overall Severity Severity of abnormal movements (highest score from questions above): None, normal Incapacitation due to abnormal movements: None, normal Patient's awareness of abnormal movements (rate only patient's report): No Awareness, Dental Status Current problems with teeth and/or dentures?: No Does patient usually wear dentures?: No  CIWA:    COWS:     Musculoskeletal: Strength & Muscle Tone: within normal limits Gait & Station: normal Patient leans: N/A  Psychiatric Specialty Exam: Physical Exam  Nursing note and vitals reviewed. Psychiatric: Her affect is angry, labile and inappropriate. Her speech is rapid and/or pressured. She is  hyperactive. Thought content is paranoid. Cognition and memory are normal. She expresses impulsivity.    Review of Systems  Neurological: Negative.   Psychiatric/Behavioral: Positive for depression. The patient is nervous/anxious.   All other systems reviewed and are negative.   Blood pressure 132/80, pulse 77, temperature 98.3 F (36.8 C), temperature source Oral, resp. rate 18, height 5' (1.524 m), weight 76.7 kg, SpO2 97 %.Body mass index is 33.01 kg/m.  General Appearance: Casual  Eye Contact:  Good  Speech:  Clear and Coherent  Volume:  Normal  Mood:  Angry, Dysphoric and Irritable  Affect:  Congruent, Inappropriate and Labile  Thought Process:  Goal Directed and Descriptions of Associations: Intact  Orientation:  Full (Time, Place, and Person)  Thought Content:  Delusions and Paranoid Ideation  Suicidal Thoughts:  No  Homicidal Thoughts:  No  Memory:  Immediate;   Fair Recent;   Fair Remote;   Fair  Judgement:  Poor  Insight:  Shallow  Psychomotor Activity:  Increased  Concentration:  Concentration: Fair and Attention Span: Fair  Recall:  [employer] of Knowledge:  Fair  Language:  Fair  Akathisia:  No  Handed:  Right  AIMS (if indicated):     Assets:  Communication Skills Desire for Improvement Financial Resources/Insurance Housing Physical Health Resilience  ADL's:  Intact  Cognition:  WNL  Sleep:  Number of Hours: 6.5     Treatment Plan Summary: Daily contact with patient to assess and evaluate symptoms and progress in treatment and Medication management   Ms. Laidlaw is a 72 year old female  with a history of severe depression admitted for worsening depression and suicidal ideation.  #Suicidal ideation, resolved -patient able to contract for safety in the hospital  #Mood -continue Seroquel 100 mg nightly -continue Prozac 20 mg daily  #Insomnia -continue Trazodone 100 mg, Clonazepam 1 mg and Seroquel 100 mg nightly  #Hypothyroidism  -continue  Synthroid 75 ucg daily  #[place] -Tessalon and Robitussin PRN  #HTN -Vasotec 20 mg daily  #Dyslipidemia -Simvastatin 20 mg daily  #Labs -lipid panel shows elevated Chol and TG, TSH normal, A1C 8.2 -EKG reviewed, NSR with QTc 451  #Disposition -discharge to her apartment -follow up with [facility]  Orson Slick, MD 02/05/2018, 2:02 PM

## 2018-02-04 NOTE — [facility] Group Notes (Signed)
[place] Group Notes:  (Nursing/MHT/Case Management/Adjunct)  Date:  02/04/2018  Time:  9:46 PM  Type of Therapy:  Group Therapy  Participation Level:  Active  Participation Quality:  Appropriate  Affect:  Appropriate  Cognitive:  Alert  Insight:  Good  Engagement in Group:  Engaged  Modes of Intervention:  Support  Summary of Progress/Problems:  Nehemiah Settle 02/04/2018, 9:46 PM

## 2018-02-04 NOTE — Tx Team (Addendum)
Interdisciplinary Treatment and Diagnostic Plan Update  02/04/2018 Time of Session: 10:30am [name] MRN: [id number]  Principal Diagnosis: Bipolar I disorder, most recent episode depressed with anxious distress ([place])  Secondary Diagnoses: Principal Problem:   Bipolar I disorder, most recent episode depressed with anxious distress ([place]) Active Problems:   Essential hypertension   Hypothyroidism   Dyslipidemia   Diabetes ([place])   PTSD (post-traumatic stress disorder)   Current Medications:  Current Facility-Administered Medications  Medication Dose Route Frequency Provider Last Rate Last Dose  . acetaminophen (TYLENOL) tablet 650 mg  650 mg Oral Q6H PRN Clapacs, Madie Reno, MD   650 mg at 02/03/18 1839  . alum & mag hydroxide-simeth (MAALOX/MYLANTA) 200-200-20 MG/5ML suspension 30 mL  30 mL Oral Q4H PRN Clapacs, Madie Reno, MD   30 mL at 02/03/18 0825  . aspirin EC tablet 81 mg  81 mg Oral Daily Clapacs, Madie Reno, MD   81 mg at 02/04/18 0836  . benzonatate (TESSALON) capsule 200 mg  200 mg Oral TID Pucilowska, Jolanta B, MD   200 mg at 02/04/18 0836  . clonazePAM (KLONOPIN) tablet 1 mg  1 mg Oral QHS Clapacs, Madie Reno, MD   1 mg at 02/03/18 2157  . enalapril (VASOTEC) tablet 20 mg  20 mg Oral Daily Clapacs, Madie Reno, MD   20 mg at 02/04/18 0836  . FLUoxetine (PROZAC) capsule 20 mg  20 mg Oral Daily Clapacs, Madie Reno, MD   20 mg at 02/04/18 0836  . glipiZIDE (GLUCOTROL XL) 24 hr tablet 5 mg  5 mg Oral BID Clapacs, Madie Reno, MD   5 mg at 02/04/18 0836  . guaiFENesin (ROBITUSSIN) 100 MG/5ML solution 100 mg  5 mL Oral Q4H PRN Pucilowska, Jolanta B, MD      . hydrOXYzine (ATARAX/VISTARIL) tablet 25 mg  25 mg Oral TID PRN Clapacs, Madie Reno, MD   25 mg at 02/02/18 2000  . levothyroxine (SYNTHROID, LEVOTHROID) tablet 75 mcg  75 mcg Oral QAC breakfast Clapacs, Madie Reno, MD   75 mcg at 02/04/18 0837  . loratadine (CLARITIN) tablet 10 mg  10 mg Oral Daily Clapacs, Madie Reno, MD   10 mg at 02/04/18 0840  .  magnesium hydroxide (MILK OF MAGNESIA) suspension 30 mL  30 mL Oral Daily PRN Clapacs, John T, MD      . QUEtiapine (SEROQUEL) tablet 100 mg  100 mg Oral QHS Pucilowska, Jolanta B, MD   100 mg at 02/03/18 2157  . simvastatin (ZOCOR) tablet 20 mg  20 mg Oral q1800 Clapacs, Madie Reno, MD   20 mg at 02/03/18 1657  . traZODone (DESYREL) tablet 100 mg  100 mg Oral QHS Clapacs, Madie Reno, MD   100 mg at 02/03/18 2157   PTA Medications: Medications Prior to Admission  Medication Sig Dispense Refill Last Dose  . acetaminophen (TYLENOL) 500 MG tablet Take 500 mg by mouth every 6 (six) hours as needed.   PRN at PRN  . aspirin 81 MG EC tablet Take 1 tablet (81 mg total) by mouth daily. Swallow whole. 30 tablet 12 02/02/2018 at Unknown time  . cetirizine (ZYRTEC) 10 MG tablet Take 10 mg by mouth daily.   02/02/2018 at Unknown time  . clonazePAM (KLONOPIN) 1 MG tablet Take 1 tablet (1 mg total) by mouth at bedtime. 30 tablet 0 02/01/2018 at Unknown time  . enalapril (VASOTEC) 20 MG tablet Take 1 tablet (20 mg total) by mouth 2 (two) times daily. 30 tablet 0  02/02/2018 at Unknown time  . FLUoxetine (PROZAC) 20 MG capsule Take 1 capsule (20 mg total) by mouth daily. (Patient not taking: Reported on 02/02/2018) 30 capsule 3 Not Taking at Unknown time  . glipiZIDE (GLUCOTROL XL) 5 MG 24 hr tablet Take 5 mg by mouth 2 (two) times daily.    02/02/2018 at Unknown time  . levothyroxine (SYNTHROID, LEVOTHROID) 75 MCG tablet Take 1 tablet (75 mcg total) by mouth every morning. 30 tablet 0 02/02/2018 at Unknown time  . lidocaine (LIDODERM) 5 % Place 1 patch onto the skin every 12 (twelve) hours. Remove & Discard patch within 12 hours or as directed by MD 10 patch 0 PRN at PRN  . Olopatadine HCl 0.2 % SOLN Place 1 drop into both eyes daily.    02/02/2018 at Unknown time  . simvastatin (ZOCOR) 20 MG tablet Take 1 tablet (20 mg total) by mouth daily. 30 tablet 0 02/02/2018 at Unknown time  . traMADol (ULTRAM) 50 MG tablet Take 1 tablet  (50 mg total) by mouth every 6 (six) hours as needed. (Patient not taking: Reported on 02/02/2018) 10 tablet 0 Not Taking at Unknown time  . traZODone (DESYREL) 100 MG tablet Take 1 tablet (100 mg total) by mouth at bedtime. 30 tablet 0 02/01/2018 at Unknown time    Patient Stressors: Health problems Medication change or noncompliance  Patient Strengths: Ability for insight Active sense of humor Average or above average intelligence Communication skills  Treatment Modalities: Medication Management, Group therapy, Case management,  1 to 1 session with clinician, Psychoeducation, Recreational therapy.   Physician Treatment Plan for Primary Diagnosis: Bipolar I disorder, most recent episode depressed with anxious distress ([place]) Long Term Goal(s): Improvement in symptoms so as ready for discharge Improvement in symptoms so as ready for discharge   Short Term Goals: Ability to identify changes in lifestyle to reduce recurrence of condition will improve Ability to verbalize feelings will improve Ability to disclose and discuss suicidal ideas Ability to demonstrate self-control will improve Ability to identify and develop effective coping behaviors will improve Ability to maintain clinical measurements within normal limits will improve Ability to identify triggers associated with substance abuse/mental health issues will improve NA  Medication Management: Evaluate patient's response, side effects, and tolerance of medication regimen.  Therapeutic Interventions: 1 to 1 sessions, Unit Group sessions and Medication administration.  Evaluation of Outcomes: Progressing  Physician Treatment Plan for Secondary Diagnosis: Principal Problem:   Bipolar I disorder, most recent episode depressed with anxious distress ([place]) Active Problems:   Essential hypertension   Hypothyroidism   Dyslipidemia   Diabetes ([place])   PTSD (post-traumatic stress disorder)  Long Term Goal(s): Improvement in  symptoms so as ready for discharge Improvement in symptoms so as ready for discharge   Short Term Goals: Ability to identify changes in lifestyle to reduce recurrence of condition will improve Ability to verbalize feelings will improve Ability to disclose and discuss suicidal ideas Ability to demonstrate self-control will improve Ability to identify and develop effective coping behaviors will improve Ability to maintain clinical measurements within normal limits will improve Ability to identify triggers associated with substance abuse/mental health issues will improve NA     Medication Management: Evaluate patient's response, side effects, and tolerance of medication regimen.  Therapeutic Interventions: 1 to 1 sessions, Unit Group sessions and Medication administration.  Evaluation of Outcomes: Progressing   RN Treatment Plan for Primary Diagnosis: Bipolar I disorder, most recent episode depressed with anxious distress ([place]) Long Term Goal(s):  Knowledge of disease and therapeutic regimen to maintain health will improve  Short Term Goals: Ability to verbalize feelings will improve, Ability to identify and develop effective coping behaviors will improve and Compliance with prescribed medications will improve  Medication Management: RN will administer medications as ordered by provider, will assess and evaluate patient's response and provide education to patient for prescribed medication. RN will report any adverse and/or side effects to prescribing provider.  Therapeutic Interventions: 1 on 1 counseling sessions, Psychoeducation, Medication administration, Evaluate responses to treatment, Monitor vital signs and CBGs as ordered, Perform/monitor CIWA, COWS, AIMS and Fall Risk screenings as ordered, Perform wound care treatments as ordered.  Evaluation of Outcomes: Progressing   LCSW Treatment Plan for Primary Diagnosis: Bipolar I disorder, most recent episode depressed with anxious  distress ([place]) Long Term Goal(s): Safe transition to appropriate next level of care at discharge, Engage patient in therapeutic group addressing interpersonal concerns.  Short Term Goals: Engage patient in aftercare planning with referrals and resources, Increase social support, Increase ability to appropriately verbalize feelings, Identify triggers associated with mental health/substance abuse issues and Increase skills for wellness and recovery  Therapeutic Interventions: Assess for all discharge needs, 1 to 1 time with Social worker, Explore available resources and support systems, Assess for adequacy in community support network, Educate family and significant other(s) on suicide prevention, Complete Psychosocial Assessment, Interpersonal group therapy.  Evaluation of Outcomes: Progressing   Progress in Treatment: Attending groups: Yes. Participating in groups: Yes. Taking medication as prescribed: Yes. Toleration medication: Yes. Family/Significant other contact made: No, will contact:  Patient refused Patient understands diagnosis: Yes. Discussing patient identified problems/goals with staff: Yes. Medical problems stabilized or resolved: Yes. Denies suicidal/homicidal ideation: Yes. Issues/concerns per patient self-inventory: No. Other:   New problem(s) identified: No, Describe:  None  New Short Term/Long Term Goal(s): None reported.   Patient Goals:  None reported.  Discharge Plan or Barriers: To return home and follow up with outpatient providers at [facility].  Reason for Continuation of Hospitalization: Depression Medication stabilization  Estimated Length of Stay: 3-5 days  Recreational Therapy: Patient Stressors: N/A Patient Goal: Patient will identify 3 triggers for depression within 5 recreation therapy group sessions  Attendees: Patient: [name] 02/04/2018 11:58 AM  Physician: Dr. Bary Leriche, MD 02/04/2018 11:58 AM  Nursing: Polly Cobia, RN 02/04/2018 11:58 AM   RN Care Manager: 02/04/2018 11:58 AM  Social Worker: Darin Engels, [place] 02/04/2018 11:58 AM  Recreational Therapist: Isaias Sakai. Marcello Fennel, LRT 02/04/2018 11:58 AM  Other: Derrek Gu, [place] 02/04/2018 11:58 AM  Other: Marney Doctor, [place] 02/04/2018 11:58 AM  Other: 02/04/2018 11:58 AM    Scribe for Treatment Team: Darin Engels, LCSW 02/04/2018 11:58 AM

## 2018-02-04 NOTE — Progress Notes (Signed)
During afternoon medication administration Pt states " she want to have evening medication for sleep adjusted because she doesn't want to sleep all day."

## 2018-02-04 NOTE — Progress Notes (Signed)
Recreation Therapy Notes  Date: 02/04/2018  Time: 9:30 pm   Location: Craft Room   Behavioral response: N/A   Intervention Topic: Teamwork  Discussion/Intervention: Patient did not attend group.   Clinical Observations/Feedback:  Patient did not attend group.   Ida Uppal LRT/CTRS        Abdalla Naramore 02/04/2018 10:19 AM

## 2018-02-04 NOTE — Progress Notes (Signed)
   02/04/18 1105  Clinical Encounter Type  Visited With Patient  Visit Type Initial;Spiritual support;Behavioral Health  Referral From Social work  Consult/Referral To Chaplain  Spiritual Encounters  Spiritual Needs Emotional;Other (Comment)   [place] attempted to attend the patient's treatment team meeting. When I introduced myself in the meeting as a chaplain the patient became angry and stomped out of the room saying that if the chaplain stays then she is leaving. I agreed to leave and stood outside of the room as the team completed Ms. Rheaume's treatment team meeting.

## 2018-02-05 LAB — GLUCOSE, CAPILLARY: GLUCOSE-CAPILLARY: 176 mg/dL — AB (ref 70–99)
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
## 2018-02-05 NOTE — Progress Notes (Signed)
This morning pt was calm and cooperative, however this afternoon pt's mood changed. Pt left group early this afternoon upset. She was in the hallway sitting in the chair outside of the medication room being very loud. Pt was approached and asked if she would like to talk about it. The pt stated "yes." Pt voiced how being asked multiple times about what her goal was she became angry. Pt then began to talk about her conflicted relationship with sister Mariann Laster. Stressed relationship is a major stressor that makes the pt angry at times and tearful at other times. Pt states "She real does love her but doesn't like how she emotional hurts her." Pt also spoke about how when someone comes into her home and sets her alarm clock to go off multiple times and it "pisses" her off. Pt stated she just throws the "God Damn" thing into the trash. Pt states that "she doesn't share this information with others because they say she delusional and she's not" Pt states "that they also call her phone to wake her up and irritate her too". By the end of the conversation the pt was calm and cooperative again.

## 2018-02-05 NOTE — Plan of Care (Signed)
D- Pt states "She slept like a log." Pt denies any SI/HI or AVH. Pt complains of having a difficult time concentrating this morning. She states "She thinks it could be because she's still waking up or it could be the medications." Pt states "She isn't experiencing any pain at this time." Pt states "She isn't experiencing any anxiety or depression at this time." Pt's goal today is to be more alert. She plans to meet her goal by taking her medications and going to group.   A- Schedule medications administered to pt, per MD orders. Support and encouragement provided. Routine Safety checks performed every 15 mins.   R- No adverse drug reactions noted. Pt contracts for safety at this time. Pt complaint with medications. Pt remains safe at this time.   Problem: Coping: Goal: Will verbalize feelings Outcome: Progressing   Problem: Education: Goal: Knowledge of [place] [facility] Education information/materials will improve Outcome: Progressing   Problem: Education: Goal: Knowledge of the prescribed therapeutic regimen will improve Outcome: Progressing   Problem: Activity: Goal: Interest or engagement in leisure activities will improve Outcome: Progressing Goal: Imbalance in normal sleep/wake cycle will improve Outcome: Progressing   Problem: Coping: Goal: Will verbalize feelings Outcome: Progressing   Problem: Role Relationship: Goal: Will demonstrate positive changes in social behaviors and relationships Outcome: Progressing   Problem: Safety: Goal: Ability to disclose and discuss suicidal ideas will improve Outcome: Progressing   Problem: Self-Concept: Goal: Will verbalize positive feelings about self Outcome: Progressing   Problem: Health Behavior/Discharge Planning: Goal: Ability to manage health-related needs will improve Outcome: Progressing   Problem: Clinical Measurements: Goal: Ability to maintain clinical measurements within normal limits will improve Outcome:  Progressing Goal: Will remain free from infection Outcome: Progressing   Problem: Activity: Goal: Risk for activity intolerance will decrease Outcome: Progressing   Problem: Nutrition: Goal: Adequate nutrition will be maintained Outcome: Progressing   Problem: Elimination: Goal: Will not experience complications related to bowel motility Outcome: Progressing Goal: Will not experience complications related to urinary retention Outcome: Progressing   Problem: Pain Managment: Goal: General experience of comfort will improve Outcome: Progressing   Problem: Safety: Goal: Ability to remain free from injury will improve Outcome: Progressing   Problem: Skin Integrity: Goal: Risk for impaired skin integrity will decrease Outcome: Progressing

## 2018-02-05 NOTE — Progress Notes (Addendum)
Recreation Therapy Notes  Date: 02/05/2018  Time: 9:30 am  Location: Craft Room  Behavioral response: Appropriate, redirected, verbally aggressive, agitated,   Intervention Topic: Self-esteem  Discussion/Intervention:  Group content today was focused on self-esteem. Patient defined self-esteem and where it comes form. The group described reasons self-esteem is important. Individuals stated things that impact self-esteem and positive ways to improve self-esteem. The group participated in the intervention "Improving Me" where patients were able to create a collage of positive things that makes them who they are. Clinical Observations/Feedback:  Patient came to group and identified the way people treat her affects her self- esteem. She stated that a way she could improve her self-esteem is to take care of herself. Individual became agitated in group when peers did not agree with her responses. She used profanity and raised her voice. When group continued with group facilitator not acknowledging her outburst; she left group slamming the door on her way out. Individual later returned to group and asked if she could come back into group. Patient sat quietly and read her book the rest of group. Cass Vandermeulen LRT/CTRS         [name] 02/05/2018 11:29 AM

## 2018-02-05 NOTE — BHH Group Notes (Signed)
[place] Group Notes:  (Nursing/MHT/Case Management/Adjunct)  Date:  02/05/2018  Time:  11:17 PM  Type of Therapy:  Group Therapy  Participation Level:  Active  Participation Quality:  Appropriate  Affect:  Appropriate  Cognitive:  Appropriate  Insight:  Appropriate  Engagement in Group:  Engaged  Modes of Intervention:  Discussion  Summary of Progress/Problems: Jenel stated her goal was not to go to sleep before 10pm medications. Tegan stated her goal was to talk with the Education officer, museum. Arcelia could not remember her social worker's name. Nakkia presented with a sense of humor during group. MHT reviewed the rules and expectations of the unit. 1. Visitation hours (2 visitors at a time, no one under 12 allowed on unit) 2. Phone hours (one long distance call per day) 3. Routine checks throughout night, encouraged to cover appropriately 4. Clean up after self, no food or drinks allowed in room 5. No touching, hugging, doing hair 6. No walking down other hallways, only room hall and main hall, or going into other's patient rooms 7. Vital times and self-inventory sheets to be completed 8. No use of last names or sharing of personal information MHT processed with patients about attending groups and availability of social workers and doctors. MHT encouraged patients to learn new coping skills while attending groups MHT encouraged communication between prescribing doctors and patients MHT encouraged patients to inform prescribing doctors of the effectiveness of the their medications  Barnie Mort 02/05/2018, 11:17 PM

## 2018-02-05 NOTE — Progress Notes (Signed)
Patient was active in the milieu, pleasant and cooperative. Attended evening activities and was appropriate. Had a snack and received her bedtime medications. Requested Tylenol reporting that she hurts "allover". Patient is requesting to discuss with MD about her medication regime. Patient was otherwise cooperative and slept throughout the night.

## 2018-02-05 NOTE — BHH Group Notes (Signed)
LCSW Group Therapy Note  02/05/2018 1:00 pm  Type of Therapy/Topic:  Group Therapy:  Balance in Life  Participation Level:  Active  Description of Group:    This group will address the concept of balance and how it feels and looks when one is unbalanced. Patients will be encouraged to process areas in their lives that are out of balance and identify reasons for remaining unbalanced. Facilitators will guide patients in utilizing problem-solving interventions to address and correct the stressor making their life unbalanced. Understanding and applying boundaries will be explored and addressed for obtaining and maintaining a balanced life. Patients will be encouraged to explore ways to assertively make their unbalanced needs known to significant others in their lives, using other group members and facilitator for support and feedback.  Therapeutic Goals: 1. Patient will identify two or more emotions or situations they have that consume much of in their lives. 2. Patient will identify signs/triggers that life has become out of balance:  3. Patient will identify two ways to set boundaries in order to achieve balance in their lives:  4. Patient will demonstrate ability to communicate their needs through discussion and/or role plays  Summary of Patient Progress:  Kassadi actively participated in today's group discussion on balance in life.  Caralynn shared that anger has consumed much of her life over the past couple of years due to being "forced" to stay in her home and become very isolated.  Emerita's was very aggressive in her conversation with the group and had to be re-directed by CSW a few times.  She displayed mood lability.  Audrena shared that she sets boundaries by "staying away from others".    Therapeutic Modalities:   Cognitive Behavioral Therapy Solution-Focused Therapy Assertiveness Training  Devona Konig, [place] 02/05/2018 4:01 PM

## 2018-02-06 LAB — GLUCOSE, CAPILLARY: GLUCOSE-CAPILLARY: 192 mg/dL — AB (ref 70–99)

## 2018-02-06 MED ORDER — PRAZOSIN HCL 2 MG PO CAPS
2.0000 mg | ORAL_CAPSULE | ORAL | Status: DC
  Administered 2018-02-06 – 2018-02-08 (×3): 2 mg via ORAL
  Filled 2018-02-06 (×5): qty 1

## 2018-02-06 NOTE — BHH Group Notes (Signed)
[place] Group Notes:  (Nursing/MHT/Case Management/Adjunct)  Date:  02/06/2018  Time:  9:46 PM  Type of Therapy:  Group Therapy  Participation Level:  Active  Participation Quality:  Appropriate  Affect:  Appropriate  Cognitive:  Alert  Insight:  Good  Engagement in Group:  Engaged  Modes of Intervention:  Support  Summary of Progress/Problems:  Nehemiah Settle 02/06/2018, 9:46 PM

## 2018-02-06 NOTE — Progress Notes (Signed)
Patient stated that the conversation she had on day shift with the staff has helped her to calmed down. She was in good spirit before going to bed. She was complaint with medications and group meeting. Denied any SI/HI. Resting quietly at this time with her eyes closed, respirations even and unlabored. Will continue to monitor every 15 minutes.

## 2018-02-06 NOTE — Progress Notes (Signed)
Recreation Therapy Notes   Date: 02/06/2018  Time: 9:30 am  Location: Craft Room  Behavioral response: Appropriate   Intervention Topic: Coping skills  Discussion/Intervention:  Group content on today was focused on coping skills. The group defined what coping skills are and when they can be used. Individuals described how they normally cope with thing and the coping skills they normally use. Patients expressed why it is important to cope with things and how not coping with things can affect you. The group participated in the intervention "My coping box" and made coping boxes while adding coping skills they could use in the future to the box. Clinical Observations/Feedback:  Patient attended group and stated coping skills is just dealing with problems. She stated negative coping skills bring you down and positive coping skills lift you up. Individual participated in the intervention and was social with peers and staff. Sharina Petre LRT/CTRS         Demarie Uhlig 02/06/2018 1:05 PM

## 2018-02-06 NOTE — Plan of Care (Signed)
Active in the milieu, pleasant and compliant with treatment

## 2018-02-06 NOTE — BHH Group Notes (Signed)
02/06/2018 1PM  Type of Therapy and Topic:  Group Therapy:  Feelings around Relapse and Recovery  Participation Level:  Active   Description of Group:    Patients in this group will discuss emotions they experience before and after a relapse. They will process how experiencing these feelings, or avoidance of experiencing them, relates to having a relapse. Facilitator will guide patients to explore emotions they have related to recovery. Patients will be encouraged to process which emotions are more powerful. They will be guided to discuss the emotional reaction significant others in their lives may have to patients' relapse or recovery. Patients will be assisted in exploring ways to respond to the emotions of others without this contributing to a relapse.  Therapeutic Goals: 1. Patient will identify two or more emotions that lead to a relapse for them 2. Patient will identify two emotions that result when they relapse 3. Patient will identify two emotions related to recovery 4. Patient will demonstrate ability to communicate their needs through discussion and/or role plays   Summary of Patient Progress: Actively and appropriately engaged in the group. Patient was able to provide support and validation to other group members.Patient practiced active listening when interacting with the facilitator and other group members. [name] spoke about her past trama and how experiencing that anger, fear and depression caused her to relapse. She said she felt foolish when she did relapse. She says "I try to work on those things. I stop talking about it when it gets too hard for me. I need to work on talking about it and not running away." Patient is still in the process of obtaining treatment goals.      Therapeutic Modalities:   Cognitive Behavioral Therapy Solution-Focused Therapy Assertiveness Training Relapse Prevention Therapy   Darin Engels, Marlinda Mike 02/06/2018 3:37 PM

## 2018-02-06 NOTE — Progress Notes (Signed)
Patient slept for 7 hours and 15 minutes, no issues.

## 2018-02-06 NOTE — Progress Notes (Signed)
Patient ID: [name], female   DOB: November 08, 1945, 72 y.o.   MRN: [id number] PER STATE REGULATIONS 482.30  THIS CHART WAS REVIEWED FOR MEDICAL NECESSITY WITH RESPECT TO THE PATIENT'S ADMISSION/ DURATION OF STAY.  NEXT REVIEW DATE: 02/10/2018 Chauncy Lean, RN, BSN CASE MANAGER

## 2018-02-06 NOTE — Plan of Care (Signed)
D: Patient denies SI/HI/AVH. Patient verbally contracts for safety. Patient is calm, cooperative and pleasant. Patient is seen in milieu interacting with peers. Patient has no complaints at this time.  A: Patient was assessed by this nurse. Patient was oriented to unit. Patient's safety was maintained on unit. Q x 15 minute observation checks were completed for safety. Patient care plan was reviewed. Patient was offered support and encouragement. Patient was encourage to attend groups, participate in unit activities and continue with plan of care.    R: Patient has no complaints of pain at this time. Patient is receptive to treatment and safety maintained on unit.     Problem: Coping: Goal: Will verbalize feelings Outcome: Progressing   Problem: Health Behavior/Discharge Planning: Goal: Ability to make decisions will improve Outcome: Progressing   Problem: Safety: Goal: Ability to disclose and discuss suicidal ideas will improve Outcome: Progressing

## 2018-02-07 LAB — GLUCOSE, CAPILLARY: Glucose-Capillary: 135 mg/dL — ABNORMAL HIGH (ref 70–99)

## 2018-02-07 NOTE — Progress Notes (Signed)
[facility] MD Progress Note  02/07/2018 5:08 PM [name]  MRN:  [id number]  Subjective:   [name] is a 72 year old female with a history of biplar disorder and PTSD admitted for dysphoric manic epidose, temperamental and prickly, who came to accept mental illness diagnosis today.  Pt reported feeling "much better" after she had a BM. She said that the constipation has been bothering her for a while.  She also reports the group therapy is very helpful and talking to her psychiatrist Dr. Mamie Nick is also very helpful to stabilize her mood.  No Si or Hi, no AVH.  Principal Problem: Bipolar I disorder, most recent episode depressed with anxious distress ([place]) Diagnosis:   Patient Active Problem List   Diagnosis Date Noted  . Bipolar I disorder, most recent episode depressed with anxious distress ([place]) [F31.9] 07/07/2017  . PTSD (post-traumatic stress disorder) [F43.10] 07/07/2017  . Adjustment disorder with mixed disturbance of emotions and conduct [F43.25] 10/11/2016  . Diabetes ([place]) [E11.9] 09/23/2014  . Essential hypertension [I10] 09/21/2014  . Hypothyroidism [E03.9] 09/21/2014  . Dyslipidemia [E78.5] 09/21/2014  . Bipolar I disorder, current or most recent episode manic, severe with mixed features ([place]) [F31.13] 09/20/2014   Total Time spent with patient: 20 minutes  Past Psychiatric History: bipolar disorder  Past Medical History:  Past Medical History:  Diagnosis Date  . Arthritis   . Cancer [facility]) 2004   right breast ca  . Diabetes mellitus without complication ([place])   . Hypercholesterolemia   . Hypertension   . Hypothyroid   . Seasonal allergies     Past Surgical History:  Procedure Laterality Date  . ABDOMINAL HYSTERECTOMY    . BREAST BIOPSY Left    neg  . BREAST EXCISIONAL BIOPSY Right 2004   breast ca and lumpectomy  . BREAST LUMPECTOMY Right 2004   breast ca  . CHOLECYSTECTOMY    . COLONOSCOPY WITH PROPOFOL N/A 02/13/2017   Procedure: COLONOSCOPY WITH  PROPOFOL;  Surgeon: Jonathon Bellows, MD;  Location: [facility] ENDOSCOPY;  Service: Gastroenterology;  Laterality: N/A;  . KIDNEY STONE SURGERY     Family History:  Family History  Problem Relation Age of Onset  . Bladder Cancer Neg Hx   . Kidney cancer Neg Hx    Family Psychiatric  History: violent father hospitalized extensively in [place], two sisters with mental illness Social History:  Social History   Substance and Sexual Activity  Alcohol Use No     Social History   Substance and Sexual Activity  Drug Use No    Social History   Socioeconomic History  . Marital status: Divorced    Spouse name: Not on file  . Number of children: Not on file  . Years of education: Not on file  . Highest education level: Not on file  Occupational History  . Not on file  Social Needs  . Financial resource strain: Not on file  . Food insecurity:    Worry: Not on file    Inability: Not on file  . Transportation needs:    Medical: Not on file    Non-medical: Not on file  Tobacco Use  . Smoking status: Former Smoker    Types: Cigarettes  . Smokeless tobacco: Never Used  Substance and Sexual Activity  . Alcohol use: No  . Drug use: No  . Sexual activity: Not Currently  Lifestyle  . Physical activity:    Days per week: Not on file    Minutes per session: Not on  file  . Stress: Not on file  Relationships  . Social connections:    Talks on phone: Not on file    Gets together: Not on file    Attends religious service: Not on file    Active member of club or organization: Not on file    Attends meetings of clubs or organizations: Not on file    Relationship status: Not on file  Other Topics Concern  . Not on file  Social History Narrative  . Not on file    Sleep: Fair  Appetite:  Fair  Current Medications: Current Facility-Administered Medications  Medication Dose Route Frequency Provider Last Rate Last Dose  . acetaminophen (TYLENOL) tablet 650 mg  650 mg Oral Q6H PRN Clapacs, Madie Reno, MD   650 mg at 02/06/18 1519  . alum & mag hydroxide-simeth (MAALOX/MYLANTA) 200-200-20 MG/5ML suspension 30 mL  30 mL Oral Q4H PRN Clapacs, Madie Reno, MD   30 mL at 02/03/18 0825  . aspirin EC tablet 81 mg  81 mg Oral Daily Clapacs, Madie Reno, MD   81 mg at 02/07/18 0751  . benzonatate (TESSALON) capsule 200 mg  200 mg Oral TID Pucilowska, Jolanta B, MD   200 mg at 02/07/18 1210  . clonazePAM (KLONOPIN) tablet 1 mg  1 mg Oral QHS Clapacs, Madie Reno, MD   1 mg at 02/06/18 2133  . enalapril (VASOTEC) tablet 20 mg  20 mg Oral Daily Clapacs, Madie Reno, MD   20 mg at 02/07/18 0751  . FLUoxetine (PROZAC) capsule 20 mg  20 mg Oral Daily Clapacs, Madie Reno, MD   20 mg at 02/07/18 0752  . glipiZIDE (GLUCOTROL XL) 24 hr tablet 5 mg  5 mg Oral BID Clapacs, Madie Reno, MD   5 mg at 02/07/18 0751  . guaiFENesin (ROBITUSSIN) 100 MG/5ML solution 100 mg  5 mL Oral Q4H PRN Pucilowska, Jolanta B, MD      . hydrOXYzine (ATARAX/VISTARIL) tablet 25 mg  25 mg Oral TID PRN Clapacs, Madie Reno, MD   25 mg at 02/02/18 2000  . levothyroxine (SYNTHROID, LEVOTHROID) tablet 75 mcg  75 mcg Oral QAC breakfast Clapacs, Madie Reno, MD   75 mcg at 02/07/18 0752  . loratadine (CLARITIN) tablet 10 mg  10 mg Oral Daily Clapacs, Madie Reno, MD   10 mg at 02/07/18 0752  . magnesium hydroxide (MILK OF MAGNESIA) suspension 30 mL  30 mL Oral Daily PRN Clapacs, Madie Reno, MD   30 mL at 02/07/18 1214  . prazosin (MINIPRESS) capsule 2 mg  2 mg Oral BH-q7a Pucilowska, Jolanta B, MD   2 mg at 02/07/18 0653  . QUEtiapine (SEROQUEL) tablet 100 mg  100 mg Oral QHS Pucilowska, Jolanta B, MD   100 mg at 02/06/18 2133  . simvastatin (ZOCOR) tablet 20 mg  20 mg Oral q1800 Clapacs, Madie Reno, MD   20 mg at 02/06/18 1742  . traZODone (DESYREL) tablet 100 mg  100 mg Oral QHS Clapacs, Madie Reno, MD   100 mg at 02/06/18 2133    Lab Results:  Results for orders placed or performed during the hospital encounter of 02/02/18 (from the past 48 hour(s))  Glucose, capillary     Status: Abnormal    Collection Time: 02/06/18  8:15 AM  Result Value Ref Range   Glucose-Capillary 192 (H) 70 - 99 mg/dL  Glucose, capillary     Status: Abnormal   Collection Time: 02/07/18  6:59 AM  Result Value Ref Range  Glucose-Capillary 135 (H) 70 - 99 mg/dL   Comment 1 Notify RN     Blood Alcohol level:  Lab Results  Component Value Date   ETH <10 07/07/2017   ETH <5 23/76/2831    Metabolic Disorder Labs: Lab Results  Component Value Date   HGBA1C 8.2 (H) 02/02/2018   MPG 189 02/02/2018   MPG 136.98 07/08/2017   No results found for: PROLACTIN Lab Results  Component Value Date   CHOL 217 (H) 02/02/2018   TRIG 310 (H) 02/02/2018   HDL 47 02/02/2018   CHOLHDL 4.6 02/02/2018   VLDL 62 (H) 02/02/2018   LDLCALC 108 (H) 02/02/2018   LDLCALC 94 07/08/2017    Physical Findings: AIMS: Facial and Oral Movements Muscles of Facial Expression: None, normal Lips and Perioral Area: None, normal Jaw: None, normal Tongue: None, normal,Extremity Movements Upper (arms, wrists, hands, fingers): None, normal Lower (legs, knees, ankles, toes): None, normal, Trunk Movements Neck, shoulders, hips: None, normal, Overall Severity Severity of abnormal movements (highest score from questions above): None, normal Incapacitation due to abnormal movements: None, normal Patient's awareness of abnormal movements (rate only patient's report): No Awareness, Dental Status Current problems with teeth and/or dentures?: No Does patient usually wear dentures?: No  CIWA:    COWS:     Musculoskeletal: Strength & Muscle Tone: within normal limits Gait & Station: normal Patient leans: N/A  Psychiatric Specialty Exam: Physical Exam  Nursing note and vitals reviewed. Psychiatric: Her affect is angry, labile and inappropriate. Her speech is rapid and/or pressured. She is hyperactive. Thought content is paranoid and delusional. Cognition and memory are normal. She expresses impulsivity.    Review of Systems   Neurological: Negative.   Psychiatric/Behavioral: Negative.   All other systems reviewed and are negative.   Blood pressure 129/70, pulse 85, temperature 98.9 F (37.2 C), temperature source Oral, resp. rate 16, height 5' (1.524 m), weight 76.7 kg, SpO2 93 %.Body mass index is 33.01 kg/m.  General Appearance: Casual  Eye Contact:  Good  Speech:  Clear and Coherent and Pressured  Volume:  Normal  Mood:  Euthymic  Affect:  Appropriate and Congruent  Thought Process:  Goal Directed and Descriptions of Associations: Intact  Orientation:  Full (Time, Place, and Person)  Thought Content:  Logical  Suicidal Thoughts:  No  Homicidal Thoughts:  No  Memory:  Immediate;   Fair Recent;   Fair Remote;   Poor  Judgement:  Fair  Insight:  Fair and Shallow  Psychomotor Activity:  Normal  Concentration:  Concentration: Fair and Attention Span: Fair  Recall:  [employer] of Knowledge:  Fair  Language:  Fair        Assets:  Communication Skills Desire for Improvement Financial Resources/Insurance Housing Physical Health Resilience Social Support  ADL's:  Intact  Cognition:  WNL  Sleep:  Number of Hours: 7     Treatment Plan Summary: Daily contact with patient to assess and evaluate symptoms and progress in treatment and Medication management   [name] is a 72 year old female with a history of severe depression admitted for worsening depression and suicidal ideation.  #Suicidal ideation, resolved -patient able to contract for safety in the hospital  #Mood, slight improvement, refuses dose increase -continue Seroquel 100 mg nightly -continue Prozac 20 mg daily  #PTSD -continue Minipress 2 mg daily for flashbacks  #Insomnia, resolved with home regimen as folows -continue Trazodone 100 mg,  -Clonazepam 1 mg nightly, recommend taper as long-term use of Benzos, especially  in elderly can post increased risks of fall and cognitive impairment.   #Hypothyroidism  -continue  Synthroid 75 ucg daily  #[place] -Tessalon and Robitussin PRN  #HTN -Vasotec 20 mg daily  #Dyslipidemia -Simvastatin 20 mg daily  #Labs -lipid panelshows elevated Chol and TG,TSH normal, A1C8.2 -EKG reviewed, NSR with QTc 451  #Disposition -discharge to her apartment -follow up with [facility]  Tyliyah Mcmeekin, MD 02/07/2018, 5:08 PM

## 2018-02-07 NOTE — Progress Notes (Signed)
Patient was cooperative with treatment and pleasant this shift. Patient denies SI/HI/AVH. Patient is seen in milieu interacting with peers. Patient's safety is maintained on the unit.

## 2018-02-07 NOTE — Progress Notes (Addendum)
D: Patient is pleasant with staff.  She denies any thoughts of self harm.  She denies any auditory hallucinations. Patient was talkative; states, "I would have liked my meds earlier.  I'm used to taking my klonopin earlier."  Informed her she will need to speak with MD about times of her medications. She states, "I had a good day today."  Patient denies any physical complaints tonight, as her constipation has been resolved.  A: Continue to monitor medication management and MD orders.  Safety checks completed every 15 minutes per protocol.  Offer support and encouragement as needed.  R: Patient is receptive to staff; her behavior is appropriate.

## 2018-02-07 NOTE — [facility] Group Notes (Signed)
LCSW Group Therapy Note  02/07/2018 1:15pm  Type of Therapy and Topic: Group Therapy: Holding on to Grudges   Participation Level: Active   Description of Group:  In this group patients will be asked to explore and define a grudge. Patients will be guided to discuss their thoughts, feelings, and reasons as to why people have grudges. Patients will process the impact grudges have on daily life and identify thoughts and feelings related to holding grudges. Facilitator will challenge patients to identify ways to let go of grudges and the benefits this provides. Patients will be confronted to address why one struggles letting go of grudges. Lastly, patients will identify feelings and thoughts related to what life would look like without grudges. This group will be process-oriented, with patients participating in exploration of their own experiences, giving and receiving support, and processing challenge from other group members.  Therapeutic Goals:  1. Patient will identify specific grudges related to their personal life.  2. Patient will identify feelings, thoughts, and beliefs around grudges.  3. Patient will identify how one releases grudges appropriately.  4. Patient will identify situations where they could have let go of the grudge, but instead chose to hold on.   Summary of Patient Progress: The patient reported "I woke up feeling great, now I feel like crap (physically)." Patients were guided to discuss their thoughts, feelings, and reasons as to why people have grudges. The patient was able to process the impact grudges have on daily life and identified thoughts and feelings related to holding grudges. The patient was challenged to identify ways to let go of grudges and the benefits this provides. Pt. actively and appropriately engaged in the group. Patient was able to provide support and validation to other group members.   Therapeutic Modalities:  Cognitive Behavioral Therapy  Solution  Focused Therapy  Motivational Interviewing  Brief Therapy   Deonte Otting  CUEBAS-COLON, LCSW 02/07/2018 12:39 PM

## 2018-02-07 NOTE — Progress Notes (Signed)
Patient had a compliant shift. Was visible in the milieu, pleasant and cooperative. Attended group and remained cooperative. Had a snack then received her bedtime medications. Patient stayed in the dayroom for a little while then  went to bed. Has been sleeping. Appears to be comfortable. Staff continue to monitor for safety.

## 2018-02-08 LAB — GLUCOSE, CAPILLARY
Glucose-Capillary: 147 mg/dL — ABNORMAL HIGH (ref 70–99)
Glucose-Capillary: 235 mg/dL — ABNORMAL HIGH (ref 70–99)

## 2018-02-08 MED ORDER — INFLUENZA VAC SPLIT HIGH-DOSE 0.5 ML IM SUSY
0.5000 mL | PREFILLED_SYRINGE | INTRAMUSCULAR | Status: AC
  Administered 2018-02-09: 0.5 mL via INTRAMUSCULAR
  Filled 2018-02-08 (×2): qty 0.5

## 2018-02-08 NOTE — Plan of Care (Signed)
D: Patient is pleasant and cooperative. Patient denies SI/HI/AVH. Patient is appropriate on unit and in milieu interacting with staff and peers.  A: patient is monitored and safety is maintained. Patient is provided with scheduled medications. Patient is offered to participate in unit activities.  R: Patient has refused several medications today and reports "they don't do anything."    Problem: Activity: Goal: Interest or engagement in leisure activities will improve Outcome: Progressing   Problem: Health Behavior/Discharge Planning: Goal: Ability to make decisions will improve Outcome: Progressing   Problem: Safety: Goal: Ability to disclose and discuss suicidal ideas will improve Outcome: Progressing   Problem: Education: Goal: Knowledge of the prescribed therapeutic regimen will improve Outcome: Not Progressing

## 2018-02-08 NOTE — [facility] Group Notes (Signed)
LCSW Group Therapy Note 02/08/2018 1:15pm  Type of Therapy and Topic: Group Therapy: Feelings Around Returning Home & Establishing a Supportive Framework and Supporting Oneself When Supports Not Available  Participation Level: Active  Description of Group:  Patients first processed thoughts and feelings about upcoming discharge. These included fears of upcoming changes, lack of change, new living environments, judgements and expectations from others and overall stigma of mental health issues. The group then discussed the definition of a supportive framework, what that looks and feels like, and how do to discern it from an unhealthy non-supportive network. The group identified different types of supports as well as what to do when your family/friends are less than helpful or unavailable  Therapeutic Goals  1. Patient will identify one healthy supportive network that they can use at discharge. 2. Patient will identify one factor of a supportive framework and how to tell it from an unhealthy network. 3. Patient able to identify one coping skill to use when they do not have positive supports from others. 4. Patient will demonstrate ability to communicate their needs through discussion and/or role plays.  Summary of Patient Progress:  Patient scored her mood at a 4 (10 best.) Pt engaged during group session. As patients processed their anxiety about discharge and described healthy supports patient shared she is ready to be discharge. She states "mentally I'm ready to go home, I need to take care of some things at home." Pt reports she does not have a support system.  Patients identified at least one self-care tool they were willing to use after discharge; self-talk.   Therapeutic Modalities Cognitive Behavioral Therapy Motivational Interviewing   [name]  CUEBAS-COLON, LCSW 02/08/2018 10:52 AM

## 2018-02-08 NOTE — Progress Notes (Signed)
[facility] MD Progress Note  02/08/2018 6:21 PM [name]  MRN:  [id number]  Subjective:   [name] is a 72 year old female with a history of biplar disorder and PTSD admitted for dysphoric manic epidose, temperamental and prickly, who came to accept mental illness diagnosis today.  She continues to report feeling well, and appreciate the help she gets on the unit, and loves to talk to Dr. Mamie Nick. She tolerates meds without no complaints.   No Si or Hi, no AVH.  Principal Problem: Bipolar I disorder, most recent episode depressed with anxious distress ([place]) Diagnosis:   Patient Active Problem List   Diagnosis Date Noted  . Bipolar I disorder, most recent episode depressed with anxious distress ([place]) [F31.9] 07/07/2017  . PTSD (post-traumatic stress disorder) [F43.10] 07/07/2017  . Adjustment disorder with mixed disturbance of emotions and conduct [F43.25] 10/11/2016  . Diabetes ([place]) [E11.9] 09/23/2014  . Essential hypertension [I10] 09/21/2014  . Hypothyroidism [E03.9] 09/21/2014  . Dyslipidemia [E78.5] 09/21/2014  . Bipolar I disorder, current or most recent episode manic, severe with mixed features ([place]) [F31.13] 09/20/2014   Total Time spent with patient: 20 minutes  Past Psychiatric History: bipolar disorder  Past Medical History:  Past Medical History:  Diagnosis Date  . Arthritis   . Cancer [facility]) 2004   right breast ca  . Diabetes mellitus without complication ([place])   . Hypercholesterolemia   . Hypertension   . Hypothyroid   . Seasonal allergies     Past Surgical History:  Procedure Laterality Date  . ABDOMINAL HYSTERECTOMY    . BREAST BIOPSY Left    neg  . BREAST EXCISIONAL BIOPSY Right 2004   breast ca and lumpectomy  . BREAST LUMPECTOMY Right 2004   breast ca  . CHOLECYSTECTOMY    . COLONOSCOPY WITH PROPOFOL N/A 02/13/2017   Procedure: COLONOSCOPY WITH PROPOFOL;  Surgeon: Jonathon Bellows, MD;  Location[facility] ENDOSCOPY;  Service: Gastroenterology;   Laterality: N/A;  . KIDNEY STONE SURGERY     Family History:  Family History  Problem Relation Age of Onset  . Bladder Cancer Neg Hx   . Kidney cancer Neg Hx    Family Psychiatric  History: violent father hospitalized extensively in [place], two sisters with mental illness Social History:  Social History   Substance and Sexual Activity  Alcohol Use No     Social History   Substance and Sexual Activity  Drug Use No    Social History   Socioeconomic History  . Marital status: Divorced    Spouse name: Not on file  . Number of children: Not on file  . Years of education: Not on file  . Highest education level: Not on file  Occupational History  . Not on file  Social Needs  . Financial resource strain: Not on file  . Food insecurity:    Worry: Not on file    Inability: Not on file  . Transportation needs:    Medical: Not on file    Non-medical: Not on file  Tobacco Use  . Smoking status: Former Smoker    Types: Cigarettes  . Smokeless tobacco: Never Used  Substance and Sexual Activity  . Alcohol use: No  . Drug use: No  . Sexual activity: Not Currently  Lifestyle  . Physical activity:    Days per week: Not on file    Minutes per session: Not on file  . Stress: Not on file  Relationships  . Social connections:    Talks on  phone: Not on file    Gets together: Not on file    Attends religious service: Not on file    Active member of club or organization: Not on file    Attends meetings of clubs or organizations: Not on file    Relationship status: Not on file  Other Topics Concern  . Not on file  Social History Narrative  . Not on file    Sleep: Fair  Appetite:  Fair  Current Medications: Current Facility-Administered Medications  Medication Dose Route Frequency Provider Last Rate Last Dose  . acetaminophen (TYLENOL) tablet 650 mg  650 mg Oral Q6H PRN Clapacs, Madie Reno, MD   650 mg at 02/06/18 1519  . alum & mag hydroxide-simeth (MAALOX/MYLANTA) 200-200-20  MG/5ML suspension 30 mL  30 mL Oral Q4H PRN Clapacs, Madie Reno, MD   30 mL at 02/03/18 0825  . aspirin EC tablet 81 mg  81 mg Oral Daily Clapacs, Madie Reno, MD   81 mg at 02/08/18 0816  . benzonatate (TESSALON) capsule 200 mg  200 mg Oral TID Pucilowska, Jolanta B, MD   200 mg at 02/08/18 0816  . clonazePAM (KLONOPIN) tablet 1 mg  1 mg Oral QHS Clapacs, John T, MD   1 mg at 02/07/18 2206  . enalapril (VASOTEC) tablet 20 mg  20 mg Oral Daily Clapacs, Madie Reno, MD   20 mg at 02/08/18 0816  . FLUoxetine (PROZAC) capsule 20 mg  20 mg Oral Daily Clapacs, Madie Reno, MD   20 mg at 02/08/18 0816  . glipiZIDE (GLUCOTROL XL) 24 hr tablet 5 mg  5 mg Oral BID Clapacs, Madie Reno, MD   5 mg at 02/08/18 1721  . guaiFENesin (ROBITUSSIN) 100 MG/5ML solution 100 mg  5 mL Oral Q4H PRN Pucilowska, Jolanta B, MD      . hydrOXYzine (ATARAX/VISTARIL) tablet 25 mg  25 mg Oral TID PRN Clapacs, Madie Reno, MD   25 mg at 02/02/18 2000  . [START ON 02/09/2018] Influenza vac split quadrivalent PF (FLUZONE HIGH-DOSE) injection 0.5 mL  0.5 mL Intramuscular Tomorrow-1000 Pucilowska, Jolanta B, MD      . levothyroxine (SYNTHROID, LEVOTHROID) tablet 75 mcg  75 mcg Oral QAC breakfast Clapacs, Madie Reno, MD   75 mcg at 02/08/18 0629  . loratadine (CLARITIN) tablet 10 mg  10 mg Oral Daily Clapacs, Madie Reno, MD   10 mg at 02/08/18 0816  . magnesium hydroxide (MILK OF MAGNESIA) suspension 30 mL  30 mL Oral Daily PRN Clapacs, Madie Reno, MD   30 mL at 02/07/18 1214  . prazosin (MINIPRESS) capsule 2 mg  2 mg Oral BH-q7a Pucilowska, Jolanta B, MD   2 mg at 02/08/18 0629  . QUEtiapine (SEROQUEL) tablet 100 mg  100 mg Oral QHS Pucilowska, Jolanta B, MD   100 mg at 02/07/18 2206  . simvastatin (ZOCOR) tablet 20 mg  20 mg Oral q1800 Clapacs, Madie Reno, MD   20 mg at 02/08/18 1721  . traZODone (DESYREL) tablet 100 mg  100 mg Oral QHS Clapacs, Madie Reno, MD   100 mg at 02/07/18 2206    Lab Results:  Results for orders placed or performed during the hospital encounter of  02/02/18 (from the past 48 hour(s))  Glucose, capillary     Status: Abnormal   Collection Time: 02/07/18  6:59 AM  Result Value Ref Range   Glucose-Capillary 135 (H) 70 - 99 mg/dL   Comment 1 Notify RN   Glucose, capillary  Status: Abnormal   Collection Time: 02/08/18  6:27 AM  Result Value Ref Range   Glucose-Capillary 147 (H) 70 - 99 mg/dL  Glucose, capillary     Status: Abnormal   Collection Time: 02/08/18 11:15 AM  Result Value Ref Range   Glucose-Capillary 235 (H) 70 - 99 mg/dL   Comment 1 Notify RN     Blood Alcohol level:  Lab Results  Component Value Date   ETH <10 07/07/2017   ETH <5 74/04/8785    Metabolic Disorder Labs: Lab Results  Component Value Date   HGBA1C 8.2 (H) 02/02/2018   MPG 189 02/02/2018   MPG 136.98 07/08/2017   No results found for: PROLACTIN Lab Results  Component Value Date   CHOL 217 (H) 02/02/2018   TRIG 310 (H) 02/02/2018   HDL 47 02/02/2018   CHOLHDL 4.6 02/02/2018   VLDL 62 (H) 02/02/2018   LDLCALC 108 (H) 02/02/2018   LDLCALC 94 07/08/2017    Physical Findings: AIMS: Facial and Oral Movements Muscles of Facial Expression: None, normal Lips and Perioral Area: None, normal Jaw: None, normal Tongue: None, normal,Extremity Movements Upper (arms, wrists, hands, fingers): None, normal Lower (legs, knees, ankles, toes): None, normal, Trunk Movements Neck, shoulders, hips: None, normal, Overall Severity Severity of abnormal movements (highest score from questions above): None, normal Incapacitation due to abnormal movements: None, normal Patient's awareness of abnormal movements (rate only patient's report): No Awareness, Dental Status Current problems with teeth and/or dentures?: No Does patient usually wear dentures?: No  CIWA:    COWS:     Musculoskeletal: Strength & Muscle Tone: within normal limits Gait & Station: normal Patient leans: N/A  Psychiatric Specialty Exam: Physical Exam  Nursing note and vitals  reviewed. Psychiatric: Her affect is angry, labile and inappropriate. Her speech is rapid and/or pressured. She is hyperactive. Thought content is paranoid and delusional. Cognition and memory are normal. She expresses impulsivity.    Review of Systems  Neurological: Negative.   Psychiatric/Behavioral: Negative.   All other systems reviewed and are negative.   Blood pressure 116/78, pulse 81, temperature 98.6 F (37 C), temperature source Oral, resp. rate 18, height 5' (1.524 m), weight 76.7 kg, SpO2 96 %.Body mass index is 33.01 kg/m.  General Appearance: Casual  Eye Contact:  Fair  Speech:  Clear and Coherent and Pressured  Volume:  Normal  Mood:  Euthymic  Affect:  Appropriate and Congruent  Thought Process:  Goal Directed and Descriptions of Associations: Intact  Orientation:  Full (Time, Place, and Person)  Thought Content:  Logical  Suicidal Thoughts:  No  Homicidal Thoughts:  No  Memory:  Immediate;   Fair Recent;   Fair Remote;   Poor  Judgement:  Fair  Insight:  Fair and Shallow  Psychomotor Activity:  Normal  Concentration:  Concentration: Fair and Attention Span: Fair  Recall:  [employer] of Knowledge:  Fair  Language:  Fair        Assets:  Communication Skills Desire for Improvement Financial Resources/Insurance Housing Physical Health Resilience Social Support  ADL's:  Intact  Cognition:  WNL  Sleep:  Number of Hours: 7     Treatment Plan Summary: Daily contact with patient to assess and evaluate symptoms and progress in treatment and Medication management   Ms. Angelo is a 72 year old female with a history of severe depression admitted for worsening depression and suicidal ideation.  #Suicidal ideation, resolved -patient able to contract for safety in the hospital  #Mood, slight improvement,  refuses dose increase -continue Seroquel 100 mg nightly -continue Prozac 20 mg daily  #PTSD -continue Minipress 2 mg daily for  flashbacks  #Insomnia, resolved with home regimen as folows -continue Trazodone 100 mg, along with seroquel 100mg  qhs.  -Clonazepam 1 mg nightly, recommend taper as long-term use of Benzos, especially in elderly can post increased risks of fall and cognitive impairment.   #Hypothyroidism  -continue Synthroid 75 ucg daily  #[place] -Tessalon and Robitussin PRN  #HTN -Vasotec 20 mg daily  #Dyslipidemia -Simvastatin 20 mg daily  #Labs -lipid panelshows elevated Chol and TG,TSH normal, A1C8.2 -EKG reviewed, NSR with QTc 451  #Disposition -discharge to her apartment -follow up with [facility]  Baileigh Modisette, MD 02/08/2018, 6:21 PM

## 2018-02-08 NOTE — Plan of Care (Signed)
Active in the milieu, compliant with treatment. Denying SI/HI

## 2018-02-09 LAB — GLUCOSE, CAPILLARY: Glucose-Capillary: 142 mg/dL — ABNORMAL HIGH (ref 70–99)

## 2018-02-09 MED ORDER — QUETIAPINE FUMARATE 100 MG PO TABS: 100.0000 mg | ORAL_TABLET | Freq: Every day | ORAL | 1 refills | Status: DC

## 2018-02-09 MED ORDER — PRAZOSIN HCL 2 MG PO CAPS: 2.0000 mg | ORAL_CAPSULE | ORAL | 1 refills | Status: DC

## 2018-02-09 MED ORDER — FLUOXETINE HCL 20 MG PO CAPS: 20.0000 mg | ORAL_CAPSULE | Freq: Every day | ORAL | 3 refills | Status: DC

## 2018-02-09 MED ORDER — TRAZODONE HCL 100 MG PO TABS: 100.0000 mg | ORAL_TABLET | Freq: Every day | ORAL | 1 refills | Status: DC

## 2018-02-09 MED ORDER — CLONAZEPAM 1 MG PO TABS: 1.0000 mg | ORAL_TABLET | Freq: Every day | ORAL | 0 refills | Status: DC

## 2018-02-09 NOTE — Progress Notes (Signed)
Recreation Therapy Notes  INPATIENT RECREATION TR PLAN  Patient Details Name: [name] MRN: [id number] DOB: Jan 04, 1946 Today's Date: 02/09/2018  Rec Therapy Plan Is patient appropriate for Therapeutic Recreation?: Yes Treatment times per week: at least 3 Estimated Length of Stay: 5-7 days TR Treatment/Interventions: Group participation (Comment)  Discharge Criteria Pt will be discharged from therapy if:: Discharged Treatment plan/goals/alternatives discussed and agreed upon by:: Patient/family  Discharge Summary Short term goals set: Patient will identify 3 triggers for depression within 5 recreation therapy group sessions Short term goals met: Complete Progress toward goals comments: Groups attended Which groups?: Coping skills, Self-esteem, Other (Comment)(Problem Solving) Reason goals not met: N/A Therapeutic equipment acquired: N/A Reason patient discharged from therapy: Discharge from hospital Pt/family agrees with progress & goals achieved: Yes Date patient discharged from therapy: 02/09/18   Allayah Raineri 02/09/2018, 1:23 PM

## 2018-02-09 NOTE — Progress Notes (Signed)
Recreation Therapy Notes  Date: 02/09/2018  Time: 9:30 am  Location: Craft Room  Behavioral response: Appropriate  Intervention Topic: Problem Solving  Discussion/Intervention:  Group content on today was focused on problem solving. The group described what problem solving is. Patients expressed how problems affect them and how they deal with problems. Individuals identified healthy ways to deal with problems. Patients explained what normally happens to them when they do not deal with problems. The group expressed reoccurring problems for them. The group participated in the intervention "Ways to Solve problems" where patients were given a chance to explore different ways to solve problems.  Clinical Observations/Feedback:  Patient came to group and stated a way she can problem solve is to stay away from people.She identified walking, reading and sewing as positive problem solving skills. Individual was social with peers and staff while participating in the intervention. [name] LRT/CTRS         [name] 02/09/2018 12:47 PM
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
## 2018-02-09 NOTE — BHH Suicide Risk Assessment (Signed)
[facility] Discharge Suicide Risk Assessment   Principal Problem: Bipolar I disorder, most recent episode depressed with anxious distress [facility]) Discharge Diagnoses:  Patient Active Problem List   Diagnosis Date Noted  . Bipolar I disorder, most recent episode depressed with anxious distress ([place]) [F31.9] 07/07/2017    Priority: High  . Bipolar I disorder, current or most recent episode manic, severe with mixed features ([place]) [F31.13] 09/20/2014    Priority: High  . PTSD (post-traumatic stress disorder) [F43.10] 07/07/2017  . Adjustment disorder with mixed disturbance of emotions and conduct [F43.25] 10/11/2016  . Diabetes ([place]) [E11.9] 09/23/2014  . Essential hypertension [I10] 09/21/2014  . Hypothyroidism [E03.9] 09/21/2014  . Dyslipidemia [E78.5] 09/21/2014    Total Time spent with patient: 20 minutes  Musculoskeletal: Strength & Muscle Tone: within normal limits Gait & Station: normal Patient leans: N/A  Psychiatric Specialty Exam: Review of Systems  Neurological: Negative.   Psychiatric/Behavioral: Negative.   All other systems reviewed and are negative.   Blood pressure 133/85, pulse 79, temperature 98.9 F (37.2 C), temperature source Oral, resp. rate 18, height 5' (1.524 m), weight 76.7 kg, SpO2 95 %.Body mass index is 33.01 kg/m.  General Appearance: Casual  Eye Contact::  Good  Speech:  Clear and Coherent409  Volume:  Normal  Mood:  Euthymic  Affect:  Appropriate  Thought Process:  Goal Directed and Descriptions of Associations: Intact  Orientation:  Full (Time, Place, and Person)  Thought Content:  WDL  Suicidal Thoughts:  No  Homicidal Thoughts:  No  Memory:  Immediate;   Fair Recent;   Fair Remote;   Fair  Judgement:  Fair  Insight:  Present  Psychomotor Activity:  Normal  Concentration:  Fair  Recall:  [employer] of [place]  Language: Fair  Akathisia:  No  Handed:  Right  AIMS (if indicated):     Assets:  Communication Skills Desire for  Improvement Financial Resources/Insurance Housing Physical Health Resilience Social Support  Sleep:  Number of Hours: 7.15  Cognition: WNL  ADL's:  Intact   Mental Status Per Nursing Assessment::   On Admission:  NA  Demographic Factors:  Age 72 or older, Divorced or widowed, Caucasian and Living alone  Loss Factors: NA  Historical Factors: Family history of mental illness or substance abuse and Impulsivity  Risk Reduction Factors:   Sense of responsibility to family  Continued Clinical Symptoms:  Bipolar Disorder:   Mixed State  Cognitive Features That Contribute To Risk:  None    Suicide Risk:  Minimal: No identifiable suicidal ideation.  Patients presenting with no risk factors but with morbid ruminations; may be classified as minimal risk based on the severity of the depressive symptoms  Follow-up Information    Pc, [employer]. Go on 02/13/2018.   Why:  Please follow up with [facility] on Friday February 13, 2018 at 9am. Please bring discharge paperwork, insurance information and all medications that you are taking. Thank you. Contact information: [place] [address] [phone]        [employer]. Call.   Why:  Please call them anytime you need a ride. CSW has also provided you with in-hand information. Thank you. Contact information: [phone]          Plan Of Care/Follow-up recommendations:  Activity:  as tolerated Diet:  low sodium heart healthy Other:  keep,follow up appointments  Orson Slick, MD 02/09/2018, 10:11 AM

## 2018-02-09 NOTE — Progress Notes (Signed)
Patient was active in the milieu, pleasant and cooperative. Attended evening group and  Remained focused. Had a snack then received bedtime medications. Reported no issues. Patient reported that the medications are helping her "I really like these medications, I hope I will keep taking them when I leave". Patient was smiling, pleasant. Went to bed and has been sleeping. Staff continue to monitor for safety.

## 2018-02-09 NOTE — Plan of Care (Signed)
D: Pt denies SI/HI/AV hallucinations. Pt is pleasant and cooperative. Pt. Rates her depression 0/10, anxiety 0/10 and hopelessness 0/10. Patient is in a good mood this morning. Patient voices no concerns. A: Pt was offered support and encouragement. Pt was given scheduled medications. Pt was encourage to attend groups. Q 15 minute checks were done for safety.  R:Pt attends groups and interacts well with peers and staff. Pt is taking medication. Pt has no complaints.Pt receptive to treatment and safety maintained on unit.    Problem: Coping: Goal: Coping ability will improve Outcome: Progressing Goal: Will verbalize feelings Outcome: Progressing   Problem: Education: Goal: Knowledge of [place] [facility] Education information/materials will improve Outcome: Progressing Goal: Emotional status will improve Outcome: Progressing Goal: Mental status will improve Outcome: Progressing Goal: Verbalization of understanding the information provided will improve Outcome: Progressing   Problem: Education: Goal: Utilization of techniques to improve thought processes will improve Outcome: Progressing Goal: Knowledge of the prescribed therapeutic regimen will improve Outcome: Progressing   Problem: Activity: Goal: Interest or engagement in leisure activities will improve Outcome: Progressing Goal: Imbalance in normal sleep/wake cycle will improve Outcome: Progressing   Problem: Coping: Goal: Coping ability will improve Outcome: Progressing Goal: Will verbalize feelings Outcome: Progressing   Problem: Health Behavior/Discharge Planning: Goal: Ability to make decisions will improve Outcome: Progressing Goal: Compliance with therapeutic regimen will improve Outcome: Progressing   Problem: Role Relationship: Goal: Will demonstrate positive changes in social behaviors and relationships Outcome: Progressing   Problem: Safety: Goal: Ability to disclose and discuss suicidal ideas will  improve Outcome: Progressing Goal: Ability to identify and utilize support systems that promote safety will improve Outcome: Progressing   Problem: Self-Concept: Goal: Will verbalize positive feelings about self Outcome: Progressing Goal: Level of anxiety will decrease Outcome: Progressing   Problem: Education: Goal: Knowledge of General Education information will improve Description Including pain rating scale, medication(s)/side effects and non-pharmacologic comfort measures Outcome: Progressing   Problem: Health Behavior/Discharge Planning: Goal: Ability to manage health-related needs will improve Outcome: Progressing   Problem: Clinical Measurements: Goal: Ability to maintain clinical measurements within normal limits will improve Outcome: Progressing Goal: Will remain free from infection Outcome: Progressing Goal: Diagnostic test results will improve Outcome: Progressing Goal: Respiratory complications will improve Outcome: Progressing Goal: Cardiovascular complication will be avoided Outcome: Progressing   Problem: Activity: Goal: Risk for activity intolerance will decrease Outcome: Progressing   Problem: Nutrition: Goal: Adequate nutrition will be maintained Outcome: Progressing   Problem: Coping: Goal: Level of anxiety will decrease Outcome: Progressing   Problem: Elimination: Goal: Will not experience complications related to bowel motility Outcome: Progressing Goal: Will not experience complications related to urinary retention Outcome: Progressing   Problem: Pain Management: Goal: General experience of comfort will improve Outcome: Progressing   Problem: Safety: Goal: Ability to remain free from injury will improve Outcome: Progressing   Problem: Skin Integrity: Goal: Risk for impaired skin integrity will decrease Outcome: Progressing

## 2018-02-09 NOTE — Progress Notes (Signed)
Patient alert, oriented and ambulatory at discharge. Patient AVS/Follow-Up/Prescriptions reviewed with patient and she verbalized understanding and has no questions. Patient vitals 98.3-136/76-73-18-100% room air. All patient belongings returned to her. Patient given National Suicide Prevention Hotline Number given to patient. Patient escorted off unit by this Probation officer.

## 2018-02-09 NOTE — Progress Notes (Signed)
  [facility] Adult Case Management Discharge Plan :  Will you be returning to the same living situation after discharge:  Yes,  Home At discharge, do you have transportation home?: Yes,  Taxi Do you have the ability to pay for your medications: Yes,  Insurance  Release of information consent forms completed and in the chart;  Patient's signature needed at discharge.  Patient to Follow up at: Follow-up Information    P[facility], [employer]. Go on 02/13/2018.   Why:  Please follow up with [facility] on Friday February 13, 2018 at 9am. Please bring discharge paperwork, insurance information and all medications that you are taking. Thank you. Contact information: [place] [address] [phone]        [employer]. Call.   Why:  Please call them anytime you need a ride. CSW has also provided you with in-hand information. Thank you. Contact information: [phone]          Next level of care provider has access to [place] and Suicide Prevention discussed: Yes,  Completed with patient  Have you used any form of tobacco in the last 30 days? (Cigarettes, Smokeless Tobacco, Cigars, and/or Pipes): No  Has patient been referred to the Quitline?: N/A patient is not a smoker  Patient has been referred for addiction treatment: N/A  Darin Engels, LCSW 02/09/2018, 9:41 AM

## 2018-06-25 ENCOUNTER — Ambulatory Visit: Payer: Self-pay | Admitting: Surgery

## 2018-06-30 ENCOUNTER — Ambulatory Visit: Payer: Self-pay | Admitting: Surgery

## 2018-07-06 DIAGNOSIS — R110 Nausea: Secondary | ICD-10-CM | POA: Insufficient documentation

## 2018-07-06 DIAGNOSIS — R001 Bradycardia, unspecified: Secondary | ICD-10-CM | POA: Insufficient documentation

## 2018-07-06 DIAGNOSIS — R45851 Suicidal ideations: Secondary | ICD-10-CM | POA: Diagnosis not present

## 2018-07-06 DIAGNOSIS — F419 Anxiety disorder, unspecified: Secondary | ICD-10-CM | POA: Insufficient documentation

## 2018-07-06 DIAGNOSIS — Z853 Personal history of malignant neoplasm of breast: Secondary | ICD-10-CM | POA: Insufficient documentation

## 2018-07-06 DIAGNOSIS — Z87891 Personal history of nicotine dependence: Secondary | ICD-10-CM | POA: Diagnosis not present

## 2018-07-06 DIAGNOSIS — F329 Major depressive disorder, single episode, unspecified: Secondary | ICD-10-CM | POA: Diagnosis not present

## 2018-07-06 DIAGNOSIS — E119 Type 2 diabetes mellitus without complications: Secondary | ICD-10-CM | POA: Insufficient documentation

## 2018-07-06 DIAGNOSIS — E039 Hypothyroidism, unspecified: Secondary | ICD-10-CM | POA: Diagnosis not present

## 2018-07-06 DIAGNOSIS — Z046 Encounter for general psychiatric examination, requested by authority: Secondary | ICD-10-CM | POA: Insufficient documentation

## 2018-07-06 DIAGNOSIS — I10 Essential (primary) hypertension: Secondary | ICD-10-CM | POA: Diagnosis not present

## 2018-07-06 DIAGNOSIS — G4700 Insomnia, unspecified: Secondary | ICD-10-CM | POA: Diagnosis not present

## 2018-07-06 DIAGNOSIS — Z79899 Other long term (current) drug therapy: Secondary | ICD-10-CM | POA: Insufficient documentation

## 2018-07-06 DIAGNOSIS — F3113 Bipolar disorder, current episode manic without psychotic features, severe: Secondary | ICD-10-CM | POA: Diagnosis not present

## 2018-07-06 DIAGNOSIS — R42 Dizziness and giddiness: Secondary | ICD-10-CM | POA: Diagnosis not present

## 2018-07-06 DIAGNOSIS — F4310 Post-traumatic stress disorder, unspecified: Secondary | ICD-10-CM | POA: Diagnosis not present

## 2018-07-07 ENCOUNTER — Ambulatory Visit: Payer: Medicare Other | Admitting: Surgery

## 2018-07-07 ENCOUNTER — Emergency Department: Payer: Medicare Other

## 2018-07-07 ENCOUNTER — Encounter: Payer: Self-pay | Admitting: *Deleted

## 2018-07-07 ENCOUNTER — Other Ambulatory Visit: Payer: Self-pay

## 2018-07-07 ENCOUNTER — Emergency Department
Admission: EM | Admit: 2018-07-07 | Discharge: 2018-07-08 | Disposition: A | Discharge status: Other Acute Inpatient Hospital | Payer: Medicare Other | Attending: Emergency Medicine | Admitting: Emergency Medicine

## 2018-07-07 ENCOUNTER — Inpatient Hospital Stay: Admission: AD | Admit: 2018-07-07 | Payer: Medicare Other | Admitting: Psychiatry

## 2018-07-07 DIAGNOSIS — R45851 Suicidal ideations: Secondary | ICD-10-CM

## 2018-07-07 DIAGNOSIS — I10 Essential (primary) hypertension: Secondary | ICD-10-CM | POA: Diagnosis present

## 2018-07-07 DIAGNOSIS — F3113 Bipolar disorder, current episode manic without psychotic features, severe: Secondary | ICD-10-CM | POA: Diagnosis not present

## 2018-07-07 DIAGNOSIS — R001 Bradycardia, unspecified: Secondary | ICD-10-CM

## 2018-07-07 DIAGNOSIS — F3110 Bipolar disorder, current episode manic without psychotic features, unspecified: Secondary | ICD-10-CM

## 2018-07-07 DIAGNOSIS — E1169 Type 2 diabetes mellitus with other specified complication: Secondary | ICD-10-CM

## 2018-07-07 DIAGNOSIS — F419 Anxiety disorder, unspecified: Secondary | ICD-10-CM | POA: Diagnosis not present

## 2018-07-07 DIAGNOSIS — E119 Type 2 diabetes mellitus without complications: Secondary | ICD-10-CM

## 2018-07-07 DIAGNOSIS — F4310 Post-traumatic stress disorder, unspecified: Secondary | ICD-10-CM

## 2018-07-07 DIAGNOSIS — G4700 Insomnia, unspecified: Secondary | ICD-10-CM

## 2018-07-07 DIAGNOSIS — E785 Hyperlipidemia, unspecified: Secondary | ICD-10-CM | POA: Diagnosis present

## 2018-07-07 DIAGNOSIS — E039 Hypothyroidism, unspecified: Secondary | ICD-10-CM | POA: Diagnosis present

## 2018-07-07 LAB — LIPID PANEL
CHOLESTEROL: 184 mg/dL (ref 0–200)
HDL: 46 mg/dL (ref >40)
LDL Cholesterol: 93 mg/dL (ref 0–99)
Total CHOL/HDL Ratio: 4 RATIO
Triglycerides: 223 mg/dL — ABNORMAL HIGH (ref <150)
VLDL: 45 mg/dL — ABNORMAL HIGH (ref 0–40)

## 2018-07-07 LAB — COMPREHENSIVE METABOLIC PANEL
ALT: 16 U/L (ref 0–44)
ANION GAP: 7 (ref 5–15)
AST: 21 U/L (ref 15–41)
Albumin: 4.2 g/dL (ref 3.5–5.0)
Alkaline Phosphatase: 47 U/L (ref 38–126)
BILIRUBIN TOTAL: 0.8 mg/dL (ref 0.3–1.2)
BUN: 16 mg/dL (ref 8–23)
CHLORIDE: 105 mmol/L (ref 98–111)
CO2: 23 mmol/L (ref 22–32)
Calcium: 9.2 mg/dL (ref 8.9–10.3)
Creatinine, Ser: 1.32 mg/dL — ABNORMAL HIGH (ref 0.44–1.00)
GFR calc Af Amer: 46 mL/min — ABNORMAL LOW (ref >60)
GFR, EST NON AFRICAN AMERICAN: 40 mL/min — AB (ref >60)
Glucose, Bld: 254 mg/dL — ABNORMAL HIGH (ref 70–99)
POTASSIUM: 3.9 mmol/L (ref 3.5–5.1)
Sodium: 135 mmol/L (ref 135–145)
TOTAL PROTEIN: 7.3 g/dL (ref 6.5–8.1)

## 2018-07-07 LAB — CBC
HCT: 41.6 % (ref 36.0–46.0)
HEMOGLOBIN: 13.6 g/dL (ref 12.0–15.0)
MCH: 28.8 pg (ref 26.0–34.0)
MCHC: 32.7 g/dL (ref 30.0–36.0)
MCV: 88.1 fL (ref 80.0–100.0)
NRBC: 0 % (ref 0.0–0.2)
PLATELETS: 306 10*3/uL (ref 150–400)
RBC: 4.72 MIL/uL (ref 3.87–5.11)
RDW: 12.8 % (ref 11.5–15.5)
WBC: 8.9 10*3/uL (ref 4.0–10.5)

## 2018-07-07 LAB — URINALYSIS, COMPLETE (UACMP) WITH MICROSCOPIC
Bacteria, UA: NONE SEEN
Bilirubin Urine: NEGATIVE
Glucose, UA: >=500 mg/dL — AB
Ketones, ur: NEGATIVE mg/dL
Leukocytes,Ua: NEGATIVE
Nitrite: NEGATIVE
Protein, ur: NEGATIVE mg/dL
SPECIFIC GRAVITY, URINE: 1.014 (ref 1.005–1.030)
pH: 6 (ref 5.0–8.0)

## 2018-07-07 LAB — ETHANOL

## 2018-07-07 LAB — URINE DRUG SCREEN, QUALITATIVE (ARMC ONLY)
AMPHETAMINES, UR SCREEN: NOT DETECTED
Barbiturates, Ur Screen: NOT DETECTED
Benzodiazepine, Ur Scrn: NOT DETECTED
COCAINE METABOLITE, UR ~~LOC~~: NOT DETECTED
Cannabinoid 50 Ng, Ur ~~LOC~~: NOT DETECTED
MDMA (ECSTASY) UR SCREEN: NOT DETECTED
Methadone Scn, Ur: NOT DETECTED
Opiate, Ur Screen: NOT DETECTED
Phencyclidine (PCP) Ur S: NOT DETECTED
TRICYCLIC, UR SCREEN: NOT DETECTED

## 2018-07-07 LAB — TSH: TSH: 3.057 u[IU]/mL (ref 0.350–4.500)

## 2018-07-07 LAB — GLUCOSE, CAPILLARY: GLUCOSE-CAPILLARY: 240 mg/dL — AB (ref 70–99)

## 2018-07-07 IMAGING — CT CT HEAD W/O CM
3 series · 15 of 45 positions shown, 18 images · non-contrast
Comparison: [DATE]

CLINICAL DATA: Vertigo

EXAM:
CT HEAD WITHOUT CONTRAST
TECHNIQUE: Contiguous axial images were obtained from the base of the skull
through the vertex without intravenous contrast.

[Series 2: head wo · axial · 0.41mm/px · z∈[+229,+344]mm · 9 of 28 slices shown, 12 images]
[im 3/28  brain]
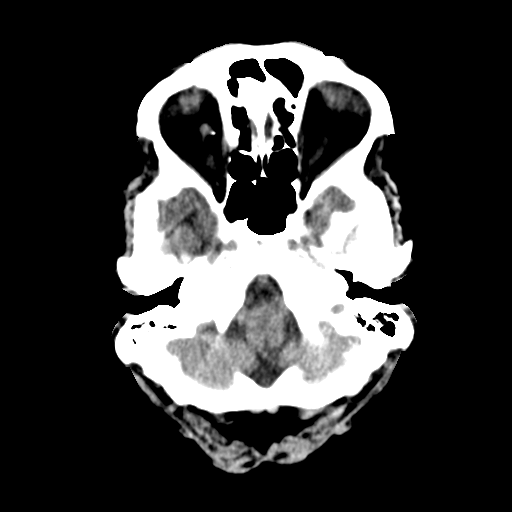
[im 3/28  bone]
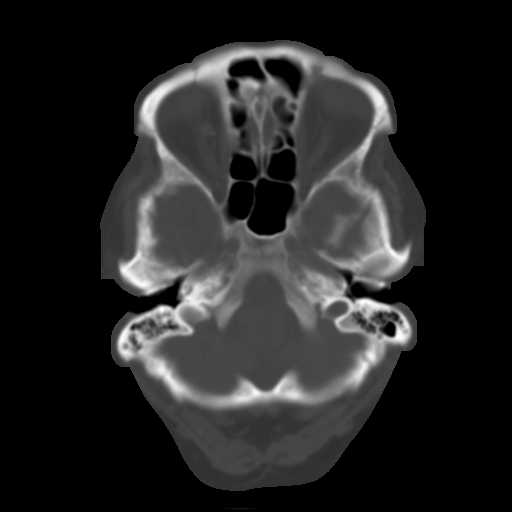
[im 6/28  brain]
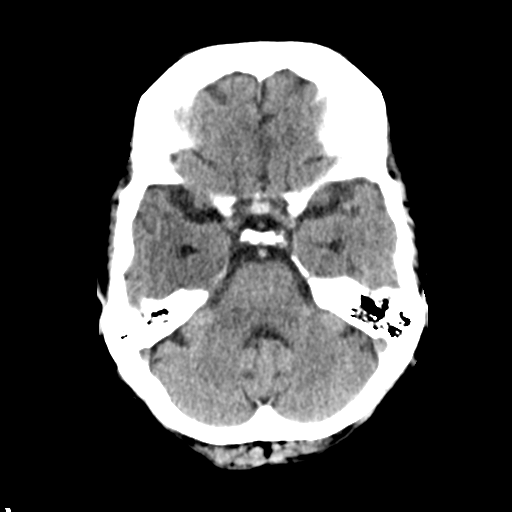
[im 9/28  brain]
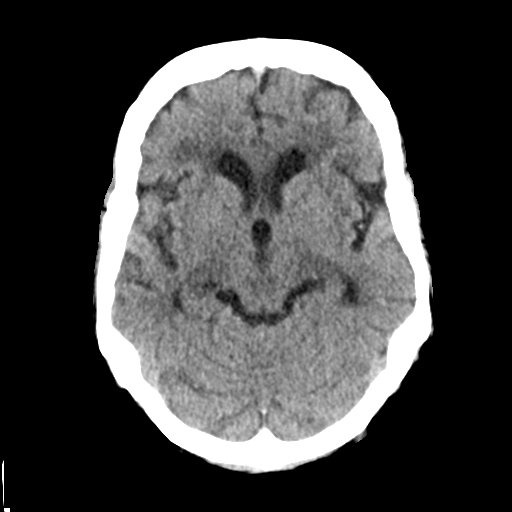
[im 12/28  brain]
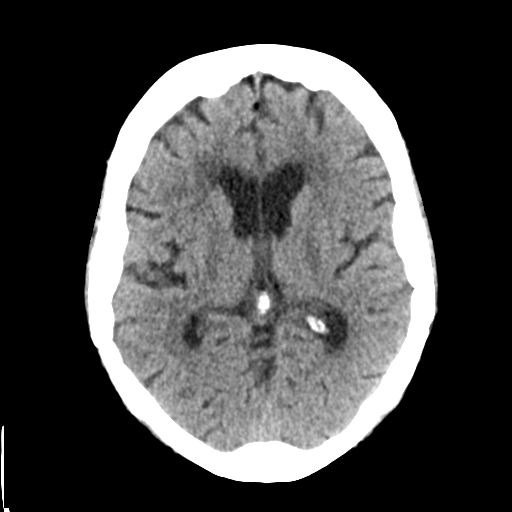
[im 15/28  brain]
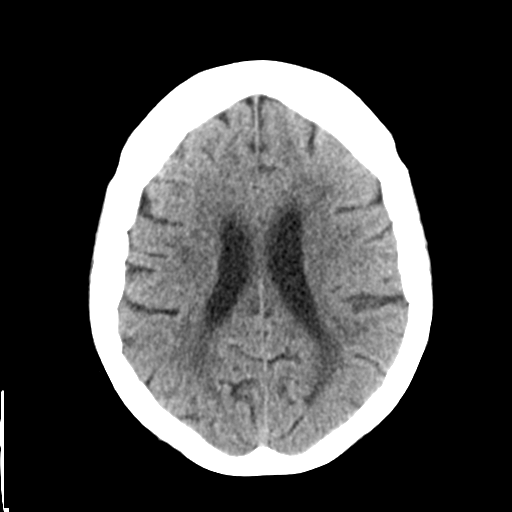
[im 15/28  bone]
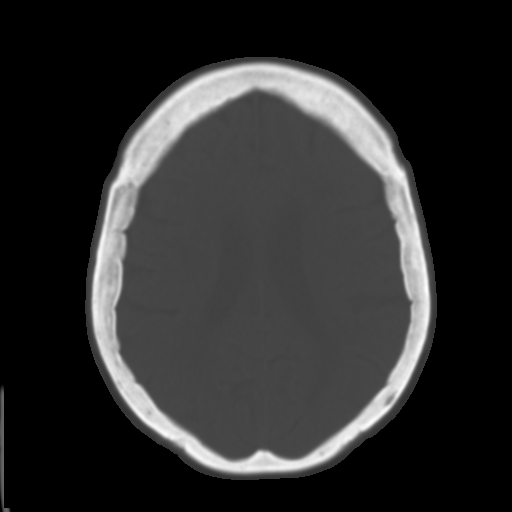
[im 17/28  brain]
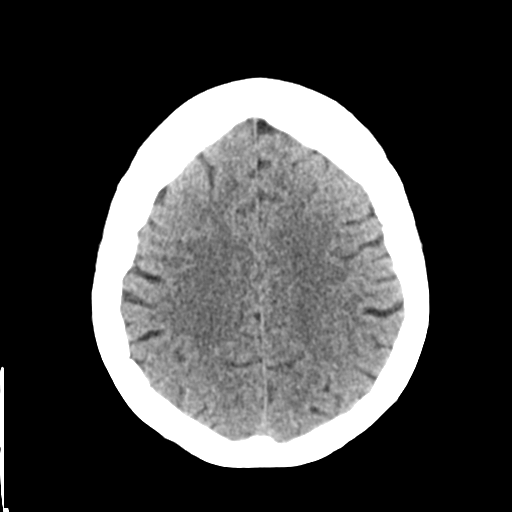
[im 20/28  brain]
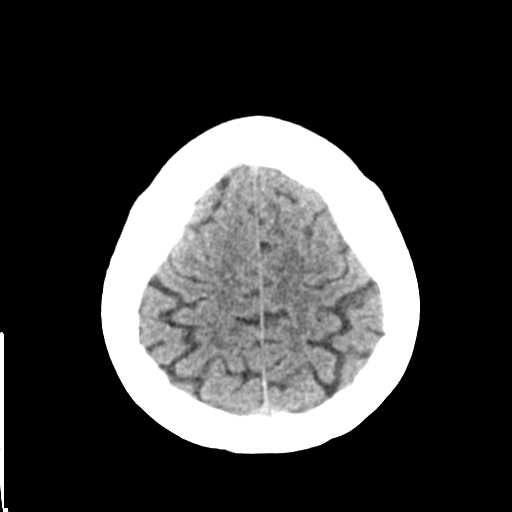
[im 23/28  brain]
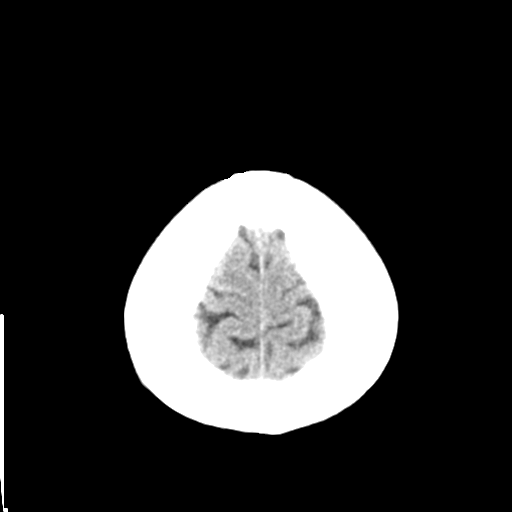
[im 26/28  brain]
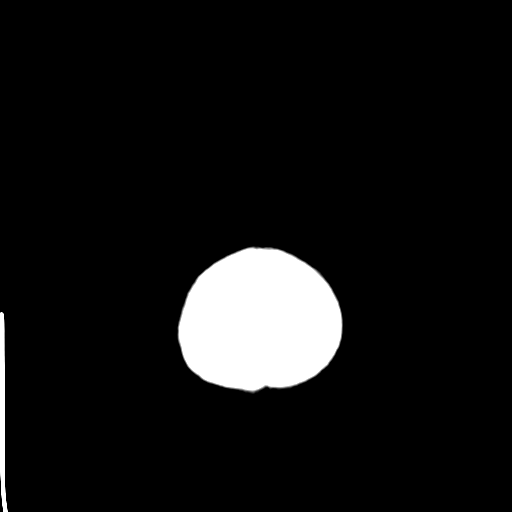
[im 26/28  bone]
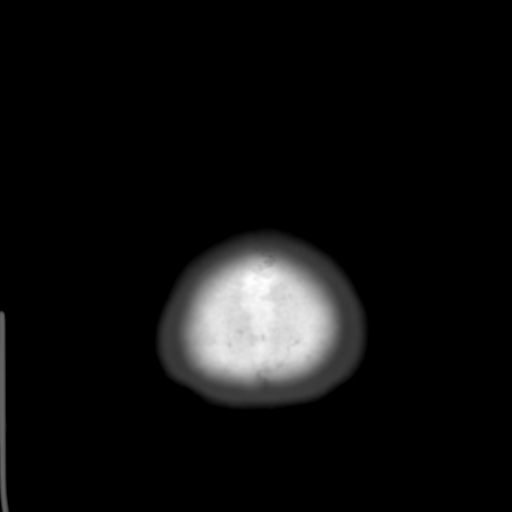

[Series 4: coronal soft tissue · coronal · 0.28mm/px · 3 of 60 slices shown]
[im 20/60  brain]
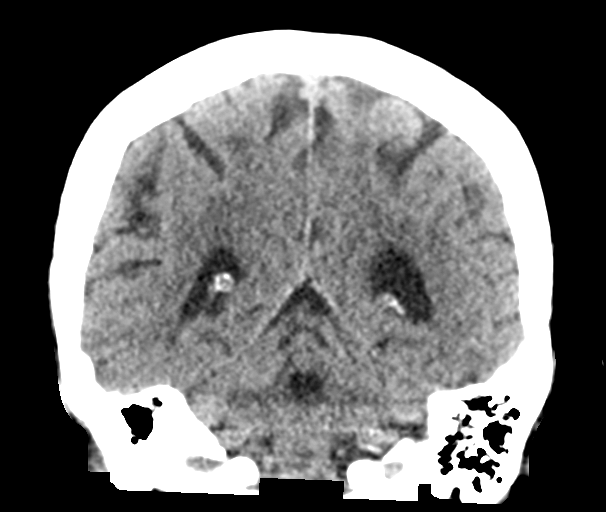
[im 27/60  brain]
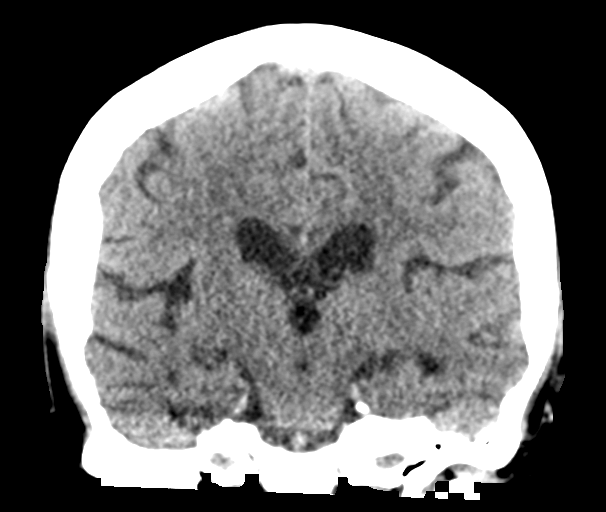
[im 33/60  brain]
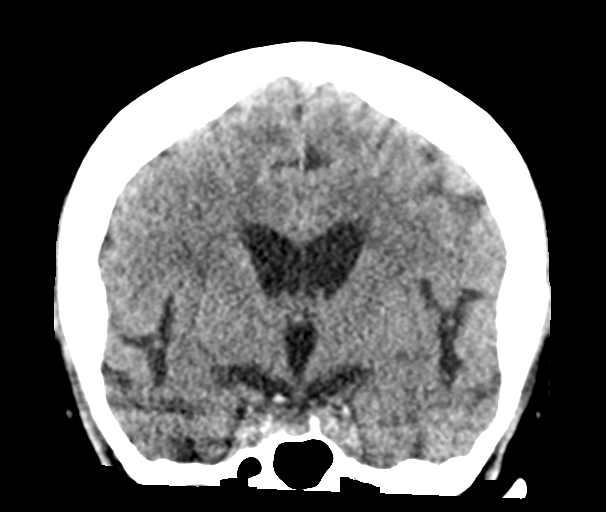

[Series 5: sagittal soft tissue · sagittal · 0.28mm/px · 3 of 49 slices shown]
[im 17/49  brain]
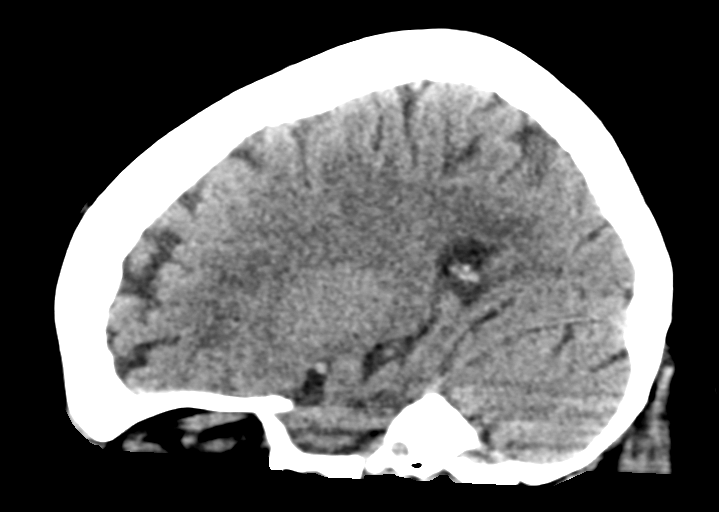
[im 25/49  brain]
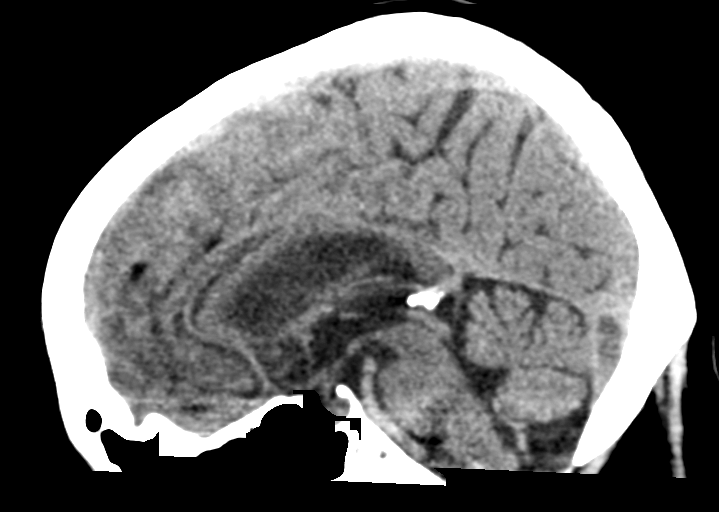
[im 33/49  brain]
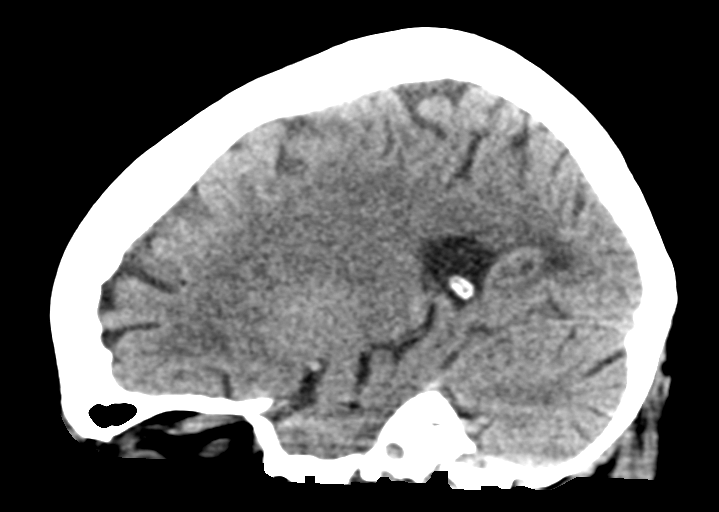

[15 of 45 positions shown; findings below may reference images not displayed]

FINDINGS: Brain: No acute territorial infarction, hemorrhage or intracranial
mass. Mild atrophy. Stable ventricle size. Moderate hypodensity in
the white matter consistent with small vessel ischemic disease.

Vascular: No hyperdense vessels.  Scattered carotid calcification

Skull: Normal. Negative for fracture or focal lesion.

Sinuses/Orbits: No acute finding.

Other: None
IMPRESSION: 1. No CT evidence for acute intracranial abnormality.
2. Atrophy with small vessel ischemic changes of the white matter

## 2018-07-07 MED ORDER — HYDROXYZINE HCL 25 MG PO TABS: 50.0000 mg | ORAL_TABLET | Freq: Four times a day (QID) | ORAL | Status: DC | PRN

## 2018-07-07 MED ORDER — INSULIN ASPART 100 UNIT/ML ~~LOC~~ SOLN
10.0000 [IU] | Freq: Once | SUBCUTANEOUS | Status: AC
  Administered 2018-07-07: 10 [IU] via SUBCUTANEOUS
  Filled 2018-07-07 (×2): qty 1

## 2018-07-07 MED ORDER — CLONAZEPAM 0.5 MG PO TABS
1.0000 mg | ORAL_TABLET | Freq: Once | ORAL | Status: AC
  Administered 2018-07-07: 1 mg via ORAL
  Filled 2018-07-07: qty 2

## 2018-07-07 MED ORDER — MECLIZINE HCL 25 MG PO TABS
25.0000 mg | ORAL_TABLET | Freq: Once | ORAL | Status: AC
  Administered 2018-07-07: 25 mg via ORAL
  Filled 2018-07-07 (×2): qty 1

## 2018-07-07 MED ORDER — LEVOTHYROXINE SODIUM 50 MCG PO TABS
75.0000 ug | ORAL_TABLET | ORAL | Status: DC
  Administered 2018-07-08: 75 ug via ORAL
  Filled 2018-07-07: qty 2

## 2018-07-07 MED ORDER — GLIPIZIDE ER 5 MG PO TB24
5.0000 mg | ORAL_TABLET | Freq: Two times a day (BID) | ORAL | Status: DC
  Administered 2018-07-07 – 2018-07-08 (×3): 5 mg via ORAL
  Filled 2018-07-07 (×4): qty 1

## 2018-07-07 MED ORDER — CLONAZEPAM 0.5 MG PO TBDP: 0.7500 mg | ORAL_TABLET | Freq: Every day | ORAL | Status: DC

## 2018-07-07 MED ORDER — TRAZODONE HCL 100 MG PO TABS: 100.0000 mg | ORAL_TABLET | Freq: Every evening | ORAL | Status: DC | PRN

## 2018-07-07 MED ORDER — TRAZODONE HCL 100 MG PO TABS: 100.0000 mg | ORAL_TABLET | Freq: Every day | ORAL | Status: DC

## 2018-07-07 MED ORDER — SIMVASTATIN 10 MG PO TABS
20.0000 mg | ORAL_TABLET | Freq: Every day | ORAL | Status: DC
  Administered 2018-07-07: 20 mg via ORAL
  Filled 2018-07-07: qty 1
  Filled 2018-07-07: qty 2

## 2018-07-07 MED ORDER — FLUOXETINE HCL 20 MG PO CAPS
40.0000 mg | ORAL_CAPSULE | Freq: Every day | ORAL | Status: DC
  Administered 2018-07-08: 40 mg via ORAL
  Filled 2018-07-07: qty 2

## 2018-07-07 MED ORDER — CLONAZEPAM 0.5 MG PO TABS
0.7500 mg | ORAL_TABLET | Freq: Every day | ORAL | Status: DC
  Administered 2018-07-07: 0.75 mg via ORAL
  Filled 2018-07-07 (×2): qty 2

## 2018-07-07 MED ORDER — CLONAZEPAM 0.5 MG PO TABS: 0.5000 mg | ORAL_TABLET | Freq: Every day | ORAL | Status: DC

## 2018-07-07 MED ORDER — DIVALPROEX SODIUM ER 250 MG PO TB24: 1000.0000 mg | ORAL_TABLET | Freq: Every day | ORAL | Status: DC

## 2018-07-07 MED ORDER — OLOPATADINE HCL 0.1 % OP SOLN
1.0000 [drp] | Freq: Two times a day (BID) | OPHTHALMIC | Status: DC
  Administered 2018-07-07 – 2018-07-08 (×2): 1 [drp] via OPHTHALMIC
  Filled 2018-07-07: qty 5

## 2018-07-07 MED ORDER — ONDANSETRON 4 MG PO TBDP: 4.0000 mg | ORAL_TABLET | Freq: Three times a day (TID) | ORAL | Status: DC | PRN

## 2018-07-07 MED ORDER — ONDANSETRON 4 MG PO TBDP
4.0000 mg | ORAL_TABLET | Freq: Once | ORAL | Status: AC
  Administered 2018-07-07: 4 mg via ORAL
  Filled 2018-07-07: qty 1

## 2018-07-07 MED ORDER — LORATADINE 10 MG PO TABS
10.0000 mg | ORAL_TABLET | Freq: Every day | ORAL | Status: DC
  Administered 2018-07-07 – 2018-07-08 (×2): 10 mg via ORAL
  Filled 2018-07-07 (×2): qty 1

## 2018-07-07 MED ORDER — MECLIZINE HCL 25 MG PO TABS: 25.0000 mg | ORAL_TABLET | Freq: Three times a day (TID) | ORAL | Status: DC | PRN

## 2018-07-07 MED ORDER — MECLIZINE HCL 25 MG PO TABS
25.0000 mg | ORAL_TABLET | Freq: Once | ORAL | Status: AC
  Administered 2018-07-07: 25 mg via ORAL
  Filled 2018-07-07: qty 1

## 2018-07-07 MED ORDER — ENALAPRIL MALEATE 10 MG PO TABS
20.0000 mg | ORAL_TABLET | Freq: Two times a day (BID) | ORAL | Status: DC
  Administered 2018-07-07 – 2018-07-08 (×2): 20 mg via ORAL
  Filled 2018-07-07: qty 1
  Filled 2018-07-07: qty 2

## 2018-07-07 MED ORDER — DIVALPROEX SODIUM ER 250 MG PO TB24
1500.0000 mg | ORAL_TABLET | Freq: Every day | ORAL | Status: AC
  Administered 2018-07-07: 1500 mg via ORAL
  Filled 2018-07-07: qty 6

## 2018-07-07 NOTE — ED Notes (Signed)
Hourly rounding reveals patient in room. No complaints, stable, in no acute distress. Q15 minute rounds and monitoring via [employer] to continue. 

## 2018-07-07 NOTE — ED Notes (Signed)
Pt given meal tray.

## 2018-07-07 NOTE — ED Notes (Signed)
Pt given graham crackers and water.

## 2018-07-07 NOTE — ED Notes (Signed)
Gave food tray with juice. 

## 2018-07-07 NOTE — ED Notes (Signed)
ED Is the patient under IVC or is there intent for IVC: Yes.   Is the patient medically cleared: Yes.   Is there vacancy in the ED BHU: Yes.   Is the population mix appropriate for patient: Yes.   Is the patient awaiting placement in inpatient or outpatient setting:  inpt psychiatric admission bed placement pending  Has the patient had a psychiatric consult: Yes.   Survey of unit performed for contraband, proper placement and condition of furniture, tampering with fixtures in bathroom, shower, and each patient room: Yes.  ; Findings:  APPEARANCE/BEHAVIOR Calm and cooperative NEURO ASSESSMENT Orientation: oriented x3  Denies pain Hallucinations: No.None noted (Hallucinations) denies  Speech: Normal Gait: normal RESPIRATORY ASSESSMENT Even  Unlabored respirations  CARDIOVASCULAR ASSESSMENT Pulses equal   regular rate  Skin warm and dry   GASTROINTESTINAL ASSESSMENT no GI complaint EXTREMITIES Full ROM  PLAN OF CARE Provide calm/safe environment. Vital signs assessed twice daily. ED BHU Assessment once each 12-hour shift. Collaborate with TTS daily or as condition indicates. Assure the ED provider has rounded once each shift. Provide and encourage hygiene. Provide redirection as needed. Assess for escalating behavior; address immediately and inform ED provider.  Assess family dynamic and appropriateness for visitation as needed: Yes.  ; If necessary, describe findings:  Educate the patient/family about BHU procedures/visitation: Yes.  ; If necessary, describe findings:

## 2018-07-07 NOTE — ED Notes (Signed)
She has ambulated to and from the BR with a steady gait   Introduced myself to her - no verbalized needs or concerns at this time   Continue to monitor

## 2018-07-07 NOTE — ED Notes (Signed)
BEHAVIORAL HEALTH ROUNDING Patient sleeping: No. Patient alert and oriented: yes Behavior appropriate: Yes.  ; If no, describe:  Nutrition and fluids offered: yes Toileting and hygiene offered: Yes  Sitter present: q15 minute observations and security  monitoring Law enforcement present: Yes  ODS  

## 2018-07-07 NOTE — ED Notes (Signed)
Patient observed lying in bed with eyes closed  Even, unlabored respirations observed   NAD pt appears to be sleeping  I will continue to monitor along with every 15 minute visual observations and ongoing security monitoring    
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
## 2018-07-07 NOTE — ED Provider Notes (Addendum)
I was called this afternoon by the nurse in the [facility] about [name] having symptoms similar to her prior vertigo, with dizziness and nausea.  She was treated with meclizine, but her symptoms have not improved.  I pulled her out of the [place] and have reevaluated her at this time.  She has reassuring vital signs, a CBG which showed hyperglycemia.  She describes to me that she had the acute onset of a room spinning sensation when she sat up from sleeping, which is associated with nausea without vomiting.  She has not had any numbness tingling or weakness, visual changes, speech or mental status changes.  She is not having any chest pain or shortness of breath.  On my exam, the patient is hemodynamically stable and neurovascularly intact.  She has a face that is symmetric with a symmetric smile, EOMI and PERRLA without horizontal or vertical nystagmus, 5 out of 5 grip strength, biceps and triceps strength, and hamstring and quad strength as well as plantar flexion and dorsiflexion strength.  She has normal sensation to light touch in the face, upper extremities and lower extremities.  She has no ataxia on finger-nose-finger testing.  She has no murmurs rubs or gallops, with a normal rate and rhythm, and lungs that are clear to auscultation.  The patient symptoms may be due to vertigo, but we will also rule out UTI as she has not had a UA since arrival.  Do not see any focal neurologic deficits, will get a CT of the head for reevaluation.  ----------------------------------------- 11:23 PM on 07/07/2018 -----------------------------------------  ED ECG REPORT I, Anne-Caroline Mariea Clonts, the attending physician, personally viewed and interpreted this ECG.   Date: 07/07/2018  EKG Time: 2224  Rate: 55  Rhythm: sinus bradycardia  Axis: normal  Intervals:first-degree A-V block   ST&T Change: No STEMI  The patient's work-up has been reassuring.  She continues to be hemodynamically stable.  Her EKG is  reassuring without any evidence of ischemia or arrhythmia.  I am awaiting the results of her urinalysis.  The patient been signed out to Dr. Kerman Passey, who will reevaluate the patient for her symptoms, and follow-up her urinalysis.    Eula Listen, MD 07/07/18 2144    Eula Listen, MD 07/07/18 2325

## 2018-07-07 NOTE — ED Notes (Signed)
Psychiatrist - [name] is consulting with her at this time

## 2018-07-07 NOTE — ED Provider Notes (Signed)
[facility] Emergency Department Provider Note ____________________________________________   First MD Initiated Contact with Patient 07/07/18 [phone]     (approximate)  I have reviewed the triage vital signs and the nursing notes.   HISTORY  Chief Complaint Behavior Problem  Level 5 caveat: History of present illness limited due to disorganized historian  HPI [name] is a 73 y.o. female with PMH as noted below including history of bipolar disorder who presents stating that she is manic and delusional, with increased anxiety and paranoia, and also reports suicidal ideation.  The patient states that she ran out of her Klonopin and was doubling up on trazodone over the last few weeks.  She states that she may have missed some doses of her medications.  She reports increased anxiety, difficulty sleeping, and she has had thoughts of hurting herself although has not attempted to do so.  Past Medical History:  Diagnosis Date  . Arthritis   . Cancer [facility]) 2004   right breast ca  . Diabetes mellitus without complication ([place])   . Hypercholesterolemia   . Hypertension   . Hypothyroid   . Seasonal allergies     Patient Active Problem List   Diagnosis Date Noted  . Bipolar I disorder, most recent episode depressed with anxious distress ([place]) 07/07/2017  . PTSD (post-traumatic stress disorder) 07/07/2017  . 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 Diabetes ([place]) 09/23/2014  . Essential hypertension 09/21/2014  . Hypothyroidism 09/21/2014  . Dyslipidemia 09/21/2014  . Bipolar I disorder, current or most recent episode manic, severe with mixed features ([place]) 09/20/2014    Past Surgical History:  Procedure Laterality Date  . ABDOMINAL HYSTERECTOMY    . BREAST BIOPSY Left    neg  . BREAST EXCISIONAL BIOPSY Right 2004   breast ca and lumpectomy  . BREAST LUMPECTOMY Right 2004   breast ca  .  CHOLECYSTECTOMY    . COLONOSCOPY WITH PROPOFOL N/A 02/13/2017   Procedure: COLONOSCOPY WITH PROPOFOL;  Surgeon: Jonathon Bellows, MD;  Location: [facility] ENDOSCOPY;  Service: Gastroenterology;  Laterality: N/A;  . KIDNEY STONE SURGERY      Prior to Admission medications   Medication Sig Start Date End Date Taking? Authorizing Provider  cetirizine (ZYRTEC) 10 MG tablet Take 10 mg by mouth daily.   Yes [provider]  clonazePAM (KLONOPIN) 1 MG tablet Take 1 tablet (1 mg total) by mouth at bedtime. 02/09/18  Yes Pucilowska, Jolanta B, MD  enalapril (VASOTEC) 20 MG tablet Take 1 tablet (20 mg total) by mouth 2 (two) times daily. 09/23/14  Yes Pucilowska, Jolanta B, MD  FLUoxetine (PROZAC) 20 MG capsule Take 1 capsule (20 mg total) by mouth daily. 02/09/18  Yes Pucilowska, Jolanta B, MD  glipiZIDE (GLUCOTROL XL) 5 MG 24 hr tablet Take 5 mg by mouth 2 (two) times daily.  01/14/17  Yes [provider]  levothyroxine (SYNTHROID, LEVOTHROID) 75 MCG tablet Take 1 tablet (75 mcg total) by mouth every morning. 09/23/14  Yes Pucilowska, Jolanta B, MD  Olopatadine HCl 0.2 % SOLN Place 1 drop into both eyes daily. 06/09/18  Yes [provider]  prazosin (MINIPRESS) 2 MG capsule Take 1 capsule (2 mg total) by mouth every morning. 02/10/18  Yes Pucilowska, Jolanta B, MD  QUEtiapine (SEROQUEL) 100 MG tablet Take 1 tablet (100 mg total) by mouth at bedtime. 02/09/18  Yes Pucilowska, Jolanta B, MD  simvastatin (ZOCOR) 20 MG tablet Take 1 tablet (20 mg  total) by mouth daily. 09/23/14  Yes Pucilowska, Jolanta B, MD  traZODone (DESYREL) 100 MG tablet Take 1 tablet (100 mg total) by mouth at bedtime. 02/09/18  Yes Pucilowska, Wardell Honour, MD    Allergies Navane [thiothixene] and Penicillins  Family History  Problem Relation Age of Onset  . Bladder Cancer Neg Hx   . Kidney cancer Neg Hx     Social History Social History   Tobacco Use  . Smoking status: Former Smoker    Types: Cigarettes  . Smokeless  tobacco: Never Used  Substance Use Topics  . Alcohol use: No  . Drug use: No    Review of Systems Level 5 caveat: Review of systems limited due to disorganized historian Constitutional: No fever. Cardiovascular: Denies chest pain. Respiratory: Denies shortness of breath. Gastrointestinal: No vomiting or diarrhea.  Positive for hemorrhoid. Genitourinary: Negative for dysuria.  Musculoskeletal: Negative for back pain. Skin: Negative for rash. Neurological: Negative for headache.   ____________________________________________   PHYSICAL EXAM:  VITAL SIGNS: ED Triage Vitals  Enc Vitals Group     BP 07/07/18 0019 (!) 154/72     Pulse Rate 07/07/18 0019 68     Resp 07/07/18 0019 20     Temp 07/07/18 0019 98.3 F (36.8 C)     Temp Source 07/07/18 0019 Oral     SpO2 07/07/18 0019 95 %     Weight 07/07/18 0020 160 lb (72.6 kg)     Height 07/07/18 0020 5' (1.524 m)     Head Circumference --      Peak Flow --      Pain Score 07/07/18 0020 0     Pain Loc --      Pain Edu? --      Excl. in [place]? --     Constitutional: Alert and oriented.  Comfortable appearing and in no acute distress. Eyes: Conjunctivae are normal.  EOMI. Head: Atraumatic. Nose: No congestion/rhinnorhea. Mouth/Throat: Mucous membranes are moist.   Neck: Normal range of motion.  Cardiovascular: Good peripheral circulation. Respiratory: Normal respiratory effort.   Gastrointestinal: No distention.  Musculoskeletal: Extremities warm and well perfused.  Neurologic:  Normal speech and language. No gross focal neurologic deficits are appreciated.  Skin:  Skin is warm and dry. No rash noted. Psychiatric: Pressured speech and somewhat tangential thoughts.  Anxious appearing and labile; intermittently tearful.  ____________________________________________   LABS (all labs ordered are listed, but only abnormal results are displayed)  Labs Reviewed  COMPREHENSIVE METABOLIC PANEL - Abnormal; Notable for the  following components:      Result Value   Glucose, Bld 254 (*)    Creatinine, Ser 1.32 (*)    GFR calc non Af Amer 40 (*)    GFR calc Af Amer 46 (*)    All other components within normal limits  ETHANOL  CBC  URINE DRUG SCREEN, QUALITATIVE ([facility] ONLY)   ____________________________________________  EKG   ____________________________________________  RADIOLOGY    ____________________________________________   PROCEDURES  Procedure(s) performed: No  Procedures  Critical Care performed: No ____________________________________________   INITIAL IMPRESSION / ASSESSMENT AND PLAN / ED COURSE  Pertinent labs & imaging results that were available during my care of the patient were reviewed by me and considered in my medical decision making (see chart for details).  73 year old female with PMH as noted above including history of bipolar disorder and prior inpatient psychiatric admissions as recently as September of last year presents with worsening anxiety, paranoia, and suicidal ideation.  On  exam her vital signs are normal except for hypertension.  She is relatively well-appearing for her age.  The remainder of the exam is unremarkable.  Her only medical concern is a hemorrhoid which is not acute in which she is planning to follow-up for as an outpatient.  The patient appears acutely manic, with rapid and pressured speech and is emotionally labile and anxious, intermittently crying during my interview.  She endorses suicidal ideation although she has not developed a plan.  She has remote history of a suicide attempt more than 10 years ago.  We will obtain lab work-up for medical clearance.  I have a place the patient under involuntary commitment due to danger to self.  We will obtain psychiatry consultation, and assuming the patient is medically cleared disposition will be per psychiatry recommendations.  ----------------------------------------- 7:40 AM on  07/07/2018 -----------------------------------------  Lab work-up is unremarkable.  The patient is medically cleared.  She is awaiting psychiatric evaluation.  I am signing the patient out to the oncoming physician Dr. Alfred Levins.  ____________________________________________   FINAL CLINICAL IMPRESSION(S) / ED DIAGNOSES  Final diagnoses:  Bipolar affective disorder, current episode manic, current episode severity unspecified ([place])  Suicidal ideation      NEW MEDICATIONS STARTED DURING THIS VISIT:  New Prescriptions   No medications on file     Note:  This document was prepared using Dragon voice recognition software and may include unintentional dictation errors.    Arta Silence, MD 07/07/18 [phone]

## 2018-07-07 NOTE — ED Notes (Signed)

## 2018-07-07 NOTE — ED Notes (Signed)
Report to include Situation, Background, Assessment, and Recommendations received from Jadeka RN. Patient alert and oriented, warm and dry, in no acute distress. Patient denies SI, HI, AVH and pain. Patient made aware of Q15 minute rounds and security cameras for their safety. Patient instructed to come to me with needs or concerns. 

## 2018-07-07 NOTE — ED Notes (Signed)
Pt to be admitted to inpt psychiatric treatment  She is IVC
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
## 2018-07-07 NOTE — BH Assessment (Signed)
Patient is to be admitted to [facility] by Dr. Leverne Humbles.  Attending Physician will be Dr. Bary Leriche.   Patient has been assigned to room 323, by [place].   Intake Paper Work has been signed and placed on patient chart.  ER staff is aware of the admission:  Glenda, ER Secretary    Dr. Mariea Clonts, ER MD   Donneta Romberg, Patient's Nurse   Butch Penny, Patient Access.

## 2018-07-07 NOTE — ED Notes (Signed)
Patient assigned to appropriate care area   Introduced self to pt  Patient oriented to unit/care area: Informed that, for their safety, care areas are designed for safety and visiting and phone hours explained to patient. Patient verbalizes understanding, and verbal contract for safety obtained  Environment secured   Patient is appropriate and cooperative. NAD noted

## 2018-07-07 NOTE — ED Notes (Signed)
Patient sitting in room and staff in nurses station heard her holler out "help", staff then went into patients room to see what was going on. Patient was sitting on the edge of the bed and gripping her sheets stating that she was feeling like she was swimming and she felt very dizzy, she has a history of vertigo and stated she received a medication at [facility] a long time ago but unfamiliar to the name of the medication. Vitals 142/59 95%RA T-98.1 HR 75 Blood sugar 240  MD Mariea Clonts notified and ordered Meclizine and asked nurse to call her back in an hour if patient is not feeling better

## 2018-07-07 NOTE — ED Notes (Addendum)
Pt. Urinated in room. Then attempted to go to the bathroom but was unsteady, yelling out for help. Dr. Mariea Clonts notified of situation even after meds were given earlier this evening. Patient to be moved back to ED for close observation for her safety.

## 2018-07-07 NOTE — ED Triage Notes (Signed)
Pt brought in via ems from home.  Pt reports anxiety, bipolar and not taking medicines appropriately.  Pt denies SI or HI.  Denies drug use or etoh.  Pt cooperative.

## 2018-07-07 NOTE — ED Notes (Signed)
IV[facility] PENDING  PLACEMENT TO  [facility] MED UNIT

## 2018-07-07 NOTE — ED Notes (Addendum)
Check back with Dr. Mariea Clonts in 1 hr.(around 1945), If motion sickness does not improve with patient.

## 2018-07-08 DIAGNOSIS — F4310 Post-traumatic stress disorder, unspecified: Secondary | ICD-10-CM | POA: Diagnosis not present

## 2018-07-08 DIAGNOSIS — F3113 Bipolar disorder, current episode manic without psychotic features, severe: Secondary | ICD-10-CM

## 2018-07-08 DIAGNOSIS — E119 Type 2 diabetes mellitus without complications: Secondary | ICD-10-CM | POA: Diagnosis not present

## 2018-07-08 DIAGNOSIS — G4700 Insomnia, unspecified: Secondary | ICD-10-CM | POA: Diagnosis not present

## 2018-07-08 DIAGNOSIS — F419 Anxiety disorder, unspecified: Secondary | ICD-10-CM | POA: Diagnosis not present

## 2018-07-08 LAB — HEMOGLOBIN A1C
Hgb A1c MFr Bld: 10 % — ABNORMAL HIGH (ref 4.8–5.6)
Mean Plasma Glucose: 240 mg/dL

## 2018-07-08 MED ORDER — ACETAMINOPHEN 325 MG PO TABS
650.0000 mg | ORAL_TABLET | Freq: Three times a day (TID) | ORAL | Status: DC | PRN
  Administered 2018-07-08: 650 mg via ORAL
  Filled 2018-07-08: qty 2

## 2018-07-08 NOTE — ED Notes (Signed)
Hourly rounding reveals patient in room. No complaints, stable, in no acute distress. Q15 minute rounds and monitoring via Rover and Officer to continue.   

## 2018-07-08 NOTE — ED Provider Notes (Signed)
Patient is calm and pleasant.  Understanding and agreement with plan to transfer her to [facility].  Has no concerns at this time except would like a glass of water.  Fully alert.  Pleasant and in no distress.   Delman Kitten, MD 07/08/18 [phone]

## 2018-07-08 NOTE — BH Assessment (Signed)
Patient has been accepted to [facility]  Patient assigned to [facility]. Accepting physician is Dr. Melvyn Novas.  Call report to [phone].  Representative was Santiago Glad - Engineering geologist.   ER Staff is aware of it:  LouAnn, ER Secretary  Dr. Jacqualine Code, ER MD  Abigail Butts, Patient's Nurse   Address: [address] [place], [place] [address]

## 2018-07-08 NOTE — ED Provider Notes (Signed)
-----------------------------------------   6:27 AM on 07/08/2018 -----------------------------------------   Blood pressure 112/65, pulse 62, temperature 98.1 F (36.7 C), temperature source Oral, resp. rate 18, height 5' (1.524 m), weight 72.6 kg, SpO2 96 %.  The patient is calm and cooperative at this time.  Patient's urinalysis resulted negative.  Patient was able to ambulate down the hallway after receiving meclizine.  Suspect likely vertigo/BPPV.  Continuing to await psychiatric disposition.    Harvest Dark, MD 07/08/18 [phone]

## 2018-07-08 NOTE — ED Notes (Signed)
Patient is alert and oriented, compliant, she said that she has struggled with depression and anxiety all of her life, she states that she was going to [place] and the doctor there told her that she was paranoid, and delusional, and that triggered her to be angry and then depressed because she did not want to be mentally ill and she didn't want anyone to know that she was, states " It made me feel like I was psycho and then I became suicidal" Patient denies plan, but states she still has those thoughts and she is glad to be admitted, also talked about living alone and feeling so isolated, because she does not have a car. Nurse will continue to monitor.
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
## 2018-07-08 NOTE — ED Notes (Signed)
Patient said she isn't steady on her feet to move back to [facility]. She said " I will if you want me too but I need assistance to walk or I have to hold the walls." EDP notified.

## 2018-07-08 NOTE — BH Assessment (Addendum)
Referral information for Geriatric Psychiatric Hospitalization faxed to:   Marland Kitchen [facility] [phone])   . [facility] ([phone]) [phone]   . [facility] ([phone]),  . Mikel Cella [phone], [phone], [phone] or [phone]),   . [facility] [phone]),   . [place] [phone]),   . [place] [phone])  . [place] [phone]),   . [address] [phone]),   . [facility] [phone] or [phone]) - no beds (per Linton Rump)  . [place] [phone] or [phone]),   . [facility] [phone]).  . [facility]    . [employer]

## 2018-07-08 NOTE — ED Notes (Signed)
Patient ate 100% of lunch and beverage.

## 2018-07-08 NOTE — BH Assessment (Signed)
Per Dr. Leverne Humbles, pt is being recommended for geriatric-psych inpatient treatment.  TTS was asked to send patient referral information for treatment bed.

## 2018-07-08 NOTE — ED Notes (Signed)
Patient voices understanding of discharge/readmit to [facility], Patient is calm and cooperative, left with [employer], all belongings taken with her.

## 2018-07-08 NOTE — Progress Notes (Addendum)
Inpatient Diabetes Program Recommendations  AACE/ADA: New Consensus Statement on Inpatient Glycemic Control   Target Ranges:  Prepandial:   less than 140 mg/dL      Peak postprandial:   less than 180 mg/dL (1-2 hours)      Critically ill patients:  140 - 180 mg/dL  Results for [name], [name] (MRN [id number]) as of 07/08/2018 10:25  Ref. Range 07/07/2018 00:21  Glucose Latest Ref Range: 70 - 99 mg/dL 254 (H)   Results for [name], [name] (MRN [id number]) as of 07/08/2018 10:25  Ref. Range 07/07/2018 18:39  Glucose-Capillary Latest Ref Range: 70 - 99 mg/dL 240 (H)   Results for [name], [name] (MRN [id number]) as of 07/08/2018 10:25  Ref. Range 07/08/2017 06:50 02/02/2018 10:41 07/07/2018 00:21  Hemoglobin A1C Latest Ref Range: 4.8 - 5.6 % 6.4 (H) 8.2 (H) 10.0 (H)   Review of Glycemic Control  Diabetes history: DM2 Outpatient Diabetes medications: Glipizide XL 5 mg BID Current orders for Inpatient glycemic control: Glipizide XL 5 mg BID  Inpatient Diabetes Program Recommendations:  Correction (SSI): Please use Glycemic Control order set to order CBGs ACHS with Novolog 0-9 units TID with meals and Novolog 0-5 units QHS. HgbA1C: A1C 10% on 07/07/18 indicating an average glucose of 240 mg/dl over the past 2-3 months. If patient has been taking Glipizide consistently, she will likely require additional DM medication prescribed for DM control. Patient needs to follow up with PCP regarding DM control.  NOTE: Patient is currently in the Emergency Room awaiting psychiatric disposition.  Per chart, patient takes Glipizide XL 5 mg BID for DM control. A1C 10% on 07/07/18 indicating an average glucose of 240 mg/dl over the past 2-3 months. If patient is taking Glipizide consistently as prescribed, anticipate patient will require additional DM medication for DM control. Recommend patient follow up with PCP regarding DM control.  Addendum 07/08/18@13 :40-Spoke with patient about diabetes and  home regimen for diabetes control. Patient reports that she is followed by PCP for diabetes management and currently she is prescribed Glipizide XL 5 mg BID an outpatient for diabetes control. Patient reports that she has Glipizide medication at home.  Patient reports that over the past 2-3 months, she has not felt well, more depressed, and has not taken her Glipizide.  Patient also admits that when her depression is worse, she eats and craves junk food.  Patient has everything at home to check her glucose but she states that she has not felt like checking her glucose lately and when she did check it from time to time it was always high.   Discussed A1C results (10.0% on 07/07/18) and explained that current A1C indicates an average glucose of 240 mg/dl over the past 2-3 months. Discussed glucose and A1C goals. Provided emotional support and expressed understanding that her current mental health needed to be addressed and treated in order for her to feel mentally capable of taking care of herself physically.  Stressed to the patient the importance of improving glycemic control to prevent further complications from uncontrolled diabetes. Discussed impact of nutrition, exercise, stress, sickness, and medications on diabetes control. Encouraged patient to try to get back in the routine of taking Glipizide XL 5 mg BID consistently, checking glucose at least daily, following carb modified diet, and try to do at least 30 minutes a day of exercise.  Patient verbalized understanding of information discussed and she states that she has no further questions at this time related to diabetes.  Thanks, Barnie Alderman,  RN, MSN, CDE Diabetes Coordinator Inpatient Diabetes Program [phone] (Team Pager from 8am to 5pm)

## 2018-07-08 NOTE — ED Notes (Addendum)
Hourly rounding reveals patient in room. No complaints, stable, in no acute distress. Q15 minute rounds and monitoring via Rover and Officer to continue.   

## 2018-07-08 NOTE — ED Notes (Signed)
[place]  [facility]  CALLED  FOR  TRANSPORT 

## 2018-07-08 NOTE — ED Notes (Signed)
Patient up to the bathroom, she is oriented, complained of back pain from having to lay down so long she states, will notify MD regarding tylenol.

## 2018-07-09 ENCOUNTER — Ambulatory Visit: Payer: Medicare Other | Admitting: Surgery

## 2018-07-18 ENCOUNTER — Encounter: Payer: Self-pay | Admitting: Emergency Medicine

## 2018-07-18 ENCOUNTER — Other Ambulatory Visit: Payer: Self-pay

## 2018-07-18 ENCOUNTER — Emergency Department
Admission: EM | Admit: 2018-07-18 | Discharge: 2018-07-18 | Disposition: A | Discharge status: Home / Self Care | Payer: Medicare Other | Attending: Emergency Medicine | Admitting: Emergency Medicine

## 2018-07-18 DIAGNOSIS — F419 Anxiety disorder, unspecified: Secondary | ICD-10-CM | POA: Insufficient documentation

## 2018-07-18 DIAGNOSIS — I10 Essential (primary) hypertension: Secondary | ICD-10-CM | POA: Insufficient documentation

## 2018-07-18 DIAGNOSIS — E039 Hypothyroidism, unspecified: Secondary | ICD-10-CM | POA: Insufficient documentation

## 2018-07-18 DIAGNOSIS — Z853 Personal history of malignant neoplasm of breast: Secondary | ICD-10-CM | POA: Insufficient documentation

## 2018-07-18 DIAGNOSIS — F319 Bipolar disorder, unspecified: Secondary | ICD-10-CM | POA: Insufficient documentation

## 2018-07-18 DIAGNOSIS — E119 Type 2 diabetes mellitus without complications: Secondary | ICD-10-CM | POA: Insufficient documentation

## 2018-07-18 DIAGNOSIS — Z79899 Other long term (current) drug therapy: Secondary | ICD-10-CM | POA: Insufficient documentation

## 2018-07-18 DIAGNOSIS — Z87891 Personal history of nicotine dependence: Secondary | ICD-10-CM | POA: Insufficient documentation

## 2018-07-18 DIAGNOSIS — Z7984 Long term (current) use of oral hypoglycemic drugs: Secondary | ICD-10-CM | POA: Insufficient documentation

## 2018-07-18 DIAGNOSIS — Z9049 Acquired absence of other specified parts of digestive tract: Secondary | ICD-10-CM | POA: Insufficient documentation

## 2018-07-18 LAB — COMPREHENSIVE METABOLIC PANEL
ALT: 26 U/L (ref 0–44)
AST: 34 U/L (ref 15–41)
Albumin: 4.4 g/dL (ref 3.5–5.0)
Alkaline Phosphatase: 49 U/L (ref 38–126)
Anion gap: 12 (ref 5–15)
BUN: 20 mg/dL (ref 8–23)
CO2: 22 mmol/L (ref 22–32)
CREATININE: 1.4 mg/dL — AB (ref 0.44–1.00)
Calcium: 9.3 mg/dL (ref 8.9–10.3)
Chloride: 103 mmol/L (ref 98–111)
GFR calc non Af Amer: 37 mL/min — ABNORMAL LOW (ref >60)
GFR, EST AFRICAN AMERICAN: 43 mL/min — AB (ref >60)
Glucose, Bld: 218 mg/dL — ABNORMAL HIGH (ref 70–99)
Potassium: 3.9 mmol/L (ref 3.5–5.1)
Sodium: 137 mmol/L (ref 135–145)
Total Bilirubin: 0.7 mg/dL (ref 0.3–1.2)
Total Protein: 7.7 g/dL (ref 6.5–8.1)

## 2018-07-18 LAB — URINE DRUG SCREEN, QUALITATIVE (ARMC ONLY)
Amphetamines, Ur Screen: NOT DETECTED
Barbiturates, Ur Screen: NOT DETECTED
Benzodiazepine, Ur Scrn: NOT DETECTED
Cannabinoid 50 Ng, Ur ~~LOC~~: NOT DETECTED
Cocaine Metabolite,Ur ~~LOC~~: NOT DETECTED
MDMA (ECSTASY) UR SCREEN: NOT DETECTED
Methadone Scn, Ur: NOT DETECTED
Opiate, Ur Screen: NOT DETECTED
Phencyclidine (PCP) Ur S: NOT DETECTED
Tricyclic, Ur Screen: POSITIVE — AB

## 2018-07-18 LAB — CBC
HEMATOCRIT: 41.6 % (ref 36.0–46.0)
Hemoglobin: 13.8 g/dL (ref 12.0–15.0)
MCH: 29 pg (ref 26.0–34.0)
MCHC: 33.2 g/dL (ref 30.0–36.0)
MCV: 87.4 fL (ref 80.0–100.0)
Platelets: 287 10*3/uL (ref 150–400)
RBC: 4.76 MIL/uL (ref 3.87–5.11)
RDW: 12.8 % (ref 11.5–15.5)
WBC: 9.1 10*3/uL (ref 4.0–10.5)
nRBC: 0 % (ref 0.0–0.2)

## 2018-07-18 LAB — SALICYLATE LEVEL: Salicylate Lvl: <7 mg/dL (ref 2.8–30.0)

## 2018-07-18 LAB — ACETAMINOPHEN LEVEL: Acetaminophen (Tylenol), Serum: <10 ug/mL — ABNORMAL LOW (ref 10–30)

## 2018-07-18 LAB — ETHANOL: Alcohol, Ethyl (B): <10 mg/dL (ref <10)

## 2018-07-18 NOTE — ED Notes (Signed)
Pt dressd out in burgundy scrubs.  Pts belongings, which include shirt, pants, underwear, socks, shoes, black purse, flip phone.  Pt has glasses on her person.  To room 23 via w/c

## 2018-07-18 NOTE — ED Triage Notes (Signed)
Patient states she began feeling anxious today. Was discharged from [facility] yesterday. Bring list of medications.
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
## 2018-07-18 NOTE — ED Triage Notes (Signed)
[facility], feels jittery, needs medications evaluated.

## 2018-07-18 NOTE — ED Provider Notes (Signed)
[facility] Emergency Department Provider Note  Time seen: 4:03 PM  I have reviewed the triage vital signs and the nursing notes.   HISTORY  Chief Complaint Anxiety    HPI [name] is a 73 y.o. female with a past medical history of diabetes, hypertension, hyperlipidemia, bipolar disorder, presents to the emergency department feeling unwell.  According to the patient she has been very tremulous today which she describes as feeling like her hands are shaking and feeling like she is shaking inside of her body.  States she just does not feel right.  States she was so jittery that she had to call 911.  Patient states she was discharged yesterday from a psychiatric facility.  Denies any thoughts of hurting yourself or anyone else.  Denies any substance use.  Took her medications last night including Prozac and Seroquel.   Past Medical History:  Diagnosis Date  . Arthritis   . Cancer [facility]) 2004   right breast ca  . Diabetes mellitus without complication ([place])   . Hypercholesterolemia   . Hypertension   . Hypothyroid   . Seasonal allergies     Patient Active Problem List   Diagnosis Date Noted  . Bipolar I disorder, most recent episode depressed with anxious distress ([place]) 07/07/2017  . PTSD (post-traumatic stress disorder) 07/07/2017  . 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 Diabetes ([place]) 09/23/2014  . Essential hypertension 09/21/2014  . Hypothyroidism 09/21/2014  . Dyslipidemia 09/21/2014  . Bipolar I disorder, current or most recent episode manic, severe with mixed features ([place]) 09/20/2014    Past Surgical History:  Procedure Laterality Date  . ABDOMINAL HYSTERECTOMY    . BREAST BIOPSY Left    neg  . BREAST EXCISIONAL BIOPSY Right 2004   breast ca and lumpectomy  . BREAST LUMPECTOMY Right 2004   breast ca  . CHOLECYSTECTOMY    . COLONOSCOPY WITH PROPOFOL N/A 02/13/2017   Procedure:  COLONOSCOPY WITH PROPOFOL;  Surgeon: Jonathon Bellows, MD;  Location: [facility] ENDOSCOPY;  Service: Gastroenterology;  Laterality: N/A;  . KIDNEY STONE SURGERY      Prior to Admission medications   Medication Sig Start Date End Date Taking? Authorizing Provider  cetirizine (ZYRTEC) 10 MG tablet Take 10 mg by mouth daily.    [provider]  clonazePAM (KLONOPIN) 1 MG tablet Take 1 tablet (1 mg total) by mouth at bedtime. 02/09/18   Pucilowska, Jolanta B, MD  enalapril (VASOTEC) 20 MG tablet Take 1 tablet (20 mg total) by mouth 2 (two) times daily. 09/23/14   Pucilowska, Herma Ard B, MD  FLUoxetine (PROZAC) 20 MG capsule Take 1 capsule (20 mg total) by mouth daily. 02/09/18   Pucilowska, Jolanta B, MD  glipiZIDE (GLUCOTROL XL) 5 MG 24 hr tablet Take 5 mg by mouth 2 (two) times daily.  01/14/17   [provider]  levothyroxine (SYNTHROID, LEVOTHROID) 75 MCG tablet Take 1 tablet (75 mcg total) by mouth every morning. 09/23/14   Pucilowska, Jolanta B, MD  Olopatadine HCl 0.2 % SOLN Place 1 drop into both eyes daily. 06/09/18   [provider]  prazosin (MINIPRESS) 2 MG capsule Take 1 capsule (2 mg total) by mouth every morning. 02/10/18   Pucilowska, Herma Ard B, MD  QUEtiapine (SEROQUEL) 100 MG tablet Take 1 tablet (100 mg total) by mouth at bedtime. 02/09/18   Pucilowska, Jolanta B, MD  simvastatin (ZOCOR) 20 MG tablet Take 1 tablet (20 mg total) by mouth daily.  09/23/14   Pucilowska, Herma Ard B, MD  traZODone (DESYREL) 100 MG tablet Take 1 tablet (100 mg total) by mouth at bedtime. 02/09/18   Pucilowska, Wardell Honour, MD    Allergies  Allergen Reactions  . Navane [Thiothixene] Other (See Comments)    Reaction:  Unknown  . Penicillins Rash    Has patient had a PCN reaction causing immediate rash, facial/tongue/throat swelling, SOB or lightheadedness with hypotension: No Has patient had a PCN reaction causing severe rash involving mucus membranes or skin necrosis: No Has patient had a PCN  reaction that required hospitalization: No Has patient had a PCN reaction occurring within the last 10 years: No If all of the above answers are "NO", then may proceed with Cephalosporin use.     Family History  Problem Relation Age of Onset  . Bladder Cancer Neg Hx   . Kidney cancer Neg Hx     Social History Social History   Tobacco Use  . Smoking status: Former Smoker    Types: Cigarettes  . Smokeless tobacco: Never Used  Substance Use Topics  . Alcohol use: No  . Drug use: No    Review of Systems Constitutional: Negative for fever. Cardiovascular: Negative for chest pain. Respiratory: Negative for shortness of breath. Gastrointestinal: Negative for abdominal pain, vomiting  Musculoskeletal: Negative for musculoskeletal complaints Skin: Negative for skin complaints  Neurological: Negative for headache All other ROS negative  ____________________________________________   PHYSICAL EXAM:  VITAL SIGNS: ED Triage Vitals  Enc Vitals Group     BP 07/18/18 1435 115/77     Pulse Rate 07/18/18 1435 85     Resp 07/18/18 1435 20     Temp 07/18/18 1435 98.3 F (36.8 C)     Temp Source 07/18/18 1435 Oral     SpO2 07/18/18 1435 96 %     Weight --      Height 07/18/18 1437 5' (1.524 m)     Head Circumference --      Peak Flow --      Pain Score 07/18/18 1436 0     Pain Loc --      Pain Edu? --      Excl. in [place]? --    Constitutional: Alert and oriented. Well appearing and in no distress. Eyes: Normal exam ENT   Head: Normocephalic and atraumatic.   Mouth/Throat: Mucous membranes are moist. Cardiovascular: Normal rate, regular rhythm. Respiratory: Normal respiratory effort without tachypnea nor retractions. Breath sounds are clear Gastrointestinal: Soft and nontender. No distention.   Musculoskeletal: Nontender with normal range of motion in all extremities.  Neurologic:  Normal speech and language. No gross focal neurologic deficits Skin:  Skin is warm, dry  and intact.  Psychiatric: Patient is anxious in appearance.  No SI or HI.  ____________________________________________  INITIAL IMPRESSION / ASSESSMENT AND PLAN / ED COURSE  Pertinent labs & imaging results that were available during my care of the patient were reviewed by me and considered in my medical decision making (see chart for details).  Patient presents to the emergency department for anxiety type symptoms.  States she felt "very jittery" today and felt like she was shaking on her insides.  Patient was discharged from a psychiatric facility yesterday.  We will have psychiatry TTS see the patient.  We will check lab work and continue to closely monitor.  Patient's lab work is largely within normal limits.  Slight renal insufficiency although unchanged from baseline.  Does not meet IVC criteria we will  continue to closely monitor.  TTS has seen the patient.  However the patient now states she wishes to go home.  Does not wish to stay to speak to a psychiatrist.  Patient has no SI or HI, here voluntarily.  We will allow the patient to leave and follow-up as an outpatient.  ____________________________________________   FINAL CLINICAL IMPRESSION(S) / ED DIAGNOSES  Anxiety   Harvest Dark, MD 07/18/18 [phone]

## 2018-07-18 NOTE — ED Notes (Signed)
    Patient states she began feeling anxious today. Was discharged from [facility] yesterday. Bring list of medications.

## 2018-07-18 NOTE — ED Notes (Signed)
Patient talking to TTS Kerry Dory)

## 2018-07-18 NOTE — ED Notes (Signed)
Patient discharged home, patient is leaving with [employer], patient received discharge papers. Patient received belongings and verbalized she has received all of her belongings. Patient appropriate and cooperative, Denies SI/HI AVH. Vital signs taken. NAD noted.

## 2018-07-20 ENCOUNTER — Other Ambulatory Visit: Payer: Self-pay

## 2018-07-20 ENCOUNTER — Encounter: Payer: Self-pay | Admitting: Emergency Medicine

## 2018-07-20 ENCOUNTER — Emergency Department
Admission: EM | Admit: 2018-07-20 | Discharge: 2018-07-21 | Disposition: A | Discharge status: Other Acute Inpatient Hospital | Payer: Medicare Other | Attending: Emergency Medicine | Admitting: Emergency Medicine

## 2018-07-20 DIAGNOSIS — E039 Hypothyroidism, unspecified: Secondary | ICD-10-CM | POA: Diagnosis not present

## 2018-07-20 DIAGNOSIS — I10 Essential (primary) hypertension: Secondary | ICD-10-CM | POA: Diagnosis not present

## 2018-07-20 DIAGNOSIS — E119 Type 2 diabetes mellitus without complications: Secondary | ICD-10-CM | POA: Insufficient documentation

## 2018-07-20 DIAGNOSIS — Z87891 Personal history of nicotine dependence: Secondary | ICD-10-CM | POA: Insufficient documentation

## 2018-07-20 DIAGNOSIS — F419 Anxiety disorder, unspecified: Secondary | ICD-10-CM

## 2018-07-20 DIAGNOSIS — F331 Major depressive disorder, recurrent, moderate: Secondary | ICD-10-CM

## 2018-07-20 DIAGNOSIS — F3112 Bipolar disorder, current episode manic without psychotic features, moderate: Secondary | ICD-10-CM | POA: Insufficient documentation

## 2018-07-20 DIAGNOSIS — Z853 Personal history of malignant neoplasm of breast: Secondary | ICD-10-CM | POA: Insufficient documentation

## 2018-07-20 DIAGNOSIS — Z79899 Other long term (current) drug therapy: Secondary | ICD-10-CM | POA: Insufficient documentation

## 2018-07-20 LAB — URINE DRUG SCREEN, QUALITATIVE (ARMC ONLY)
Amphetamines, Ur Screen: NOT DETECTED
Barbiturates, Ur Screen: NOT DETECTED
Benzodiazepine, Ur Scrn: NOT DETECTED
CANNABINOID 50 NG, UR ~~LOC~~: NOT DETECTED
Cocaine Metabolite,Ur ~~LOC~~: NOT DETECTED
MDMA (Ecstasy)Ur Screen: NOT DETECTED
Methadone Scn, Ur: NOT DETECTED
Opiate, Ur Screen: NOT DETECTED
Phencyclidine (PCP) Ur S: NOT DETECTED
Tricyclic, Ur Screen: POSITIVE — AB

## 2018-07-20 LAB — CBC
HCT: 41.1 % (ref 36.0–46.0)
Hemoglobin: 13.4 g/dL (ref 12.0–15.0)
MCH: 28.3 pg (ref 26.0–34.0)
MCHC: 32.6 g/dL (ref 30.0–36.0)
MCV: 86.9 fL (ref 80.0–100.0)
Platelets: 285 10*3/uL (ref 150–400)
RBC: 4.73 MIL/uL (ref 3.87–5.11)
RDW: 12.7 % (ref 11.5–15.5)
WBC: 8 10*3/uL (ref 4.0–10.5)
nRBC: 0 % (ref 0.0–0.2)

## 2018-07-20 LAB — ACETAMINOPHEN LEVEL: Acetaminophen (Tylenol), Serum: <10 ug/mL — ABNORMAL LOW (ref 10–30)

## 2018-07-20 LAB — SALICYLATE LEVEL: Salicylate Lvl: <7 mg/dL (ref 2.8–30.0)

## 2018-07-20 LAB — COMPREHENSIVE METABOLIC PANEL
ALT: 26 U/L (ref 0–44)
AST: 39 U/L (ref 15–41)
Albumin: 4.2 g/dL (ref 3.5–5.0)
Alkaline Phosphatase: 44 U/L (ref 38–126)
Anion gap: 10 (ref 5–15)
BUN: 18 mg/dL (ref 8–23)
CO2: 21 mmol/L — ABNORMAL LOW (ref 22–32)
Calcium: 9 mg/dL (ref 8.9–10.3)
Chloride: 105 mmol/L (ref 98–111)
Creatinine, Ser: 1.16 mg/dL — ABNORMAL HIGH (ref 0.44–1.00)
GFR calc Af Amer: 54 mL/min — ABNORMAL LOW (ref >60)
GFR calc non Af Amer: 47 mL/min — ABNORMAL LOW (ref >60)
Glucose, Bld: 184 mg/dL — ABNORMAL HIGH (ref 70–99)
Potassium: 4.1 mmol/L (ref 3.5–5.1)
Sodium: 136 mmol/L (ref 135–145)
Total Bilirubin: 0.7 mg/dL (ref 0.3–1.2)
Total Protein: 7.3 g/dL (ref 6.5–8.1)

## 2018-07-20 LAB — ETHANOL: Alcohol, Ethyl (B): <10 mg/dL (ref <10)

## 2018-07-20 LAB — VALPROIC ACID LEVEL: Valproic Acid Lvl: <10 ug/mL — ABNORMAL LOW (ref 50.0–100.0)

## 2018-07-20 MED ORDER — LORAZEPAM 2 MG PO TABS: 2.0000 mg | ORAL_TABLET | Freq: Once | ORAL | Status: DC

## 2018-07-20 MED ORDER — FLUOXETINE HCL 20 MG PO CAPS
20.0000 mg | ORAL_CAPSULE | Freq: Every day | ORAL | Status: AC
  Administered 2018-07-20: 20 mg via ORAL
  Filled 2018-07-20: qty 1

## 2018-07-20 MED ORDER — DIVALPROEX SODIUM ER 250 MG PO TB24
1500.0000 mg | ORAL_TABLET | Freq: Every day | ORAL | Status: AC
  Administered 2018-07-21: 1500 mg via ORAL
  Filled 2018-07-20: qty 6

## 2018-07-20 MED ORDER — CLONAZEPAM 0.5 MG PO TABS: 1.0000 mg | ORAL_TABLET | Freq: Every day | ORAL | Status: DC

## 2018-07-20 MED ORDER — GLIPIZIDE ER 5 MG PO TB24
5.0000 mg | ORAL_TABLET | Freq: Two times a day (BID) | ORAL | Status: DC
  Administered 2018-07-20 – 2018-07-21 (×3): 5 mg via ORAL
  Filled 2018-07-20 (×6): qty 1

## 2018-07-20 MED ORDER — FLUOXETINE HCL 20 MG PO CAPS
20.0000 mg | ORAL_CAPSULE | Freq: Every day | ORAL | Status: DC
  Administered 2018-07-20 – 2018-07-21 (×2): 20 mg via ORAL
  Filled 2018-07-20 (×2): qty 1

## 2018-07-20 MED ORDER — ENALAPRIL MALEATE 10 MG PO TABS
20.0000 mg | ORAL_TABLET | Freq: Two times a day (BID) | ORAL | Status: DC
  Administered 2018-07-20 – 2018-07-21 (×3): 20 mg via ORAL
  Filled 2018-07-20 (×3): qty 2

## 2018-07-20 MED ORDER — LORATADINE 10 MG PO TABS
10.0000 mg | ORAL_TABLET | Freq: Every day | ORAL | Status: DC
  Administered 2018-07-20 – 2018-07-21 (×2): 10 mg via ORAL
  Filled 2018-07-20 (×2): qty 1

## 2018-07-20 MED ORDER — QUETIAPINE FUMARATE 25 MG PO TABS: 100.0000 mg | ORAL_TABLET | Freq: Every day | ORAL | Status: DC

## 2018-07-20 MED ORDER — SIMVASTATIN 10 MG PO TABS
20.0000 mg | ORAL_TABLET | Freq: Every day | ORAL | Status: DC
  Administered 2018-07-20 – 2018-07-21 (×2): 20 mg via ORAL
  Filled 2018-07-20 (×2): qty 2

## 2018-07-20 MED ORDER — LEVOTHYROXINE SODIUM 50 MCG PO TABS
75.0000 ug | ORAL_TABLET | ORAL | Status: DC
  Administered 2018-07-20 – 2018-07-21 (×2): 75 ug via ORAL
  Filled 2018-07-20 (×2): qty 2

## 2018-07-20 MED ORDER — PRAZOSIN HCL 1 MG PO CAPS
2.0000 mg | ORAL_CAPSULE | ORAL | Status: DC
  Administered 2018-07-20: 2 mg via ORAL
  Filled 2018-07-20: qty 2

## 2018-07-20 NOTE — Progress Notes (Signed)
Inpatient Diabetes Program Recommendations  AACE/ADA: New Consensus Statement on Inpatient Glycemic Control  Target Ranges:  Prepandial:   less than 140 mg/dL      Peak postprandial:   less than 180 mg/dL (1-2 hours)      Critically ill patients:  140 - 180 mg/dL   Results for [name], [name] (MRN [id number]) as of 07/20/2018 11:15  Ref. Range 07/20/2018 03:15  Glucose Latest Ref Range: 70 - 99 mg/dL 184 (H)  Results for [name], [name] (MRN [id number]) as of 07/20/2018 11:15  Ref. Range 07/07/2018 00:21  Hemoglobin A1C Latest Ref Range: 4.8 - 5.6 % 10.0 (H)   Review of Glycemic Control Diabetes history: DM2 Outpatient Diabetes medications: Glipizide XL 5 mg BID Current orders for Inpatient glycemic control: Glipizide XL 5 mg BID  Inpatient Diabetes Program Recommendations:  Correction (SSI): Please use Glycemic Control order set to order CBGs ACHS with Novolog 0-9 units TID with meals and Novolog 0-5 units QHS.  HgbA1C: A1C 10% on 07/07/18 indicating an average glucose of 240 mg/dl over the past 2-3 months. Inpatient Diabetes Coordinator spoke with patient on 07/08/18 (while holding in the Emergency Room) regarding A1C and DM control. Patient needs to follow up with PCP regarding DM control.  Thanks, Barnie Alderman, RN, MSN, CDE Diabetes Coordinator Inpatient Diabetes Program [phone] (Team Pager from 8am to 5pm)

## 2018-07-20 NOTE — Progress Notes (Signed)
This nurse attempted to call report to lower level behavioral medicine unit.  Izora Gala stated that both nurses were doing an admission currently and that she would have one of them return my call to receive report.

## 2018-07-20 NOTE — Consult Note (Signed)
[facility] Consult   Reason for Consult: Anxiety, suicidal and homicidal ideation Referring Physician: Dr. Joni Fears Patient Identification: [name] MRN:  [id number] Principal Diagnosis: Bipolar 1 disorder with moderate mania ([place]) Diagnosis:   Patient Active Problem List   Diagnosis Date Noted  . Bipolar 1 disorder with moderate mania ([place]) [F31.12] 07/20/2018  . Bipolar I disorder, most recent episode depressed with anxious distress ([place]) [F31.9] 07/07/2017  . PTSD (post-traumatic stress disorder) [F43.10] 07/07/2017  . Adjustment disorder with mixed disturbance of emotions and conduct [F43.25] 10/11/2016  . Diabetes ([place]) [E11.9] 09/23/2014  . Essential hypertension [I10] 09/21/2014  . Hypothyroidism [E03.9] 09/21/2014  . Dyslipidemia [E78.5] 09/21/2014  . Bipolar I disorder, current or most recent episode manic, severe with mixed features [facility]) [F31.13] 09/20/2014   Patient is seen, chart is reviewed. Total Time spent with patient: 1 hour  Subjective:  " I am not feeling right since I got discharged from the hospital."  HPI:  [name] is a 73 y.o. female patient with a past medical history of diabetes, hypertension, hyperlipidemia, bipolar disorder, presents to the emergency department feeling unwell.  According to the patient she has been very tremulous today which she describes as feeling like her hands are shaking and feeling like she is shaking inside of her body.  States she just does not feel right.  States she was so jittery that she had to call 911.  Patient states she was discharged yesterday from a psychiatric facility.  Denies any thoughts of hurting yourself or anyone else. Denies any substance use.  Took her medications last night including Prozac and Seroquel.  At previous presentation, patient presented with benzodiazepine dependence and withdrawal.  She was admitted for 13 days to [facility] geriatric psychiatry with a  comorbid vertigo.  Patient reports that she was discharged on 17 July 2018.  Patient notes that since she has been home she has begun isolating herself again.  She was to have follow-up in [place], but has not made that appointment.  Patient reports that she has not been eating or sleeping since she got home from the hospital.  She reports that she previously had been on Klonopin and trazodone and found them helpful.  When discussing with patient fall risk and cognitive disturbances caused by benzodiazepines, she quickly argues that she does not have dementia, her memory is fine, and she is sometimes unsteady on her feet because she runs from place to place.  Patient's speech is fast and pressured during interview.  She has loose associations, easily distracted, and requires significant redirection during interview.  Patient is denying SI, HI, AVH.  She is perseverative about adding clonazepam back into her medication regimen.  She is agreeable to admission in order to help her current mania symptoms and restabilize her mood.  Past Psychiatric History: Patient has a long history of mood instability with a diagnosis of bipolar disorder and PTSD.  She has long been resistant to appropriate medication management.  Antidepressants have provided some transient relief but she is usually noncompliant with him and has always refused mood stabilizers.   Patient reports using Prozac with good effect, but has "worn off." She has had a trial of Wellbutrin XL which she did not tolerate as well and affect also wore off. Seroquel in past has not been effective for mood stabilization or sleep.  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 Arthritis   . 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 Diabetes mellitus without complication ([place])   . Hypercholesterolemia   . Hypertension   . Hypothyroid   . Seasonal allergies     Past Surgical History:  Procedure Laterality Date  . ABDOMINAL HYSTERECTOMY    . BREAST BIOPSY Left    neg  . BREAST EXCISIONAL BIOPSY Right 2004   breast ca and lumpectomy  . BREAST LUMPECTOMY Right 2004   breast ca  . CHOLECYSTECTOMY    . COLONOSCOPY WITH PROPOFOL N/A 02/13/2017   Procedure: COLONOSCOPY WITH PROPOFOL;  Surgeon: Jonathon Bellows, MD;  Location: [facility] ENDOSCOPY;  Service: Gastroenterology;  Laterality: N/A;  . KIDNEY STONE SURGERY     Medical history: Patient has diabetes managed with oral medication high blood pressure hypothyroidism dyslipidemia  Family History:  Family History  Problem Relation Age of Onset  . Bladder Cancer Neg Hx   . Kidney cancer Neg Hx    Family Psychiatric  History: Positive for mood disorder  Social History:  Social History   Substance and Sexual Activity  Alcohol Use No     Social History   Substance and Sexual Activity  Drug Use No    Social History   Socioeconomic History  . Marital status: Divorced    Spouse name: Not on file  . Number of children: Not on file  . Years of education: Not on file  . Highest education level: Not on file  Occupational History  . Not on file  Social Needs  . Financial resource strain: Not on file  . Food insecurity:    Worry: Not on file    Inability: Not on file  . Transportation needs:    Medical: Not on file    Non-medical: Not on file  Tobacco Use  . Smoking status: Former Smoker    Types: Cigarettes  . Smokeless tobacco: Never Used  Substance and Sexual Activity  . Alcohol use: No  . Drug use: No  . Sexual activity: Not Currently  Lifestyle  . Physical activity:    Days per week: Not on file    Minutes per session: Not on file  . Stress: Not on file  Relationships  . Social connections:    Talks on phone: Not on file    Gets  together: Not on file    Attends religious service: Not on file    Active member of club or organization: Not on file    Attends meetings of clubs or organizations: Not on file    Relationship status: Not on file  Other Topics Concern  . Not on file  Social History Narrative  . Not on file   Additional Social History:    Substance abuse history: None  Social history: Patient lives by herself.  She does have some extended family but contact with them is only intermittent.    Allergies:   Allergies  Allergen Reactions  . Navane [Thiothixene] Other (See Comments)    Reaction:  Unknown  . Penicillins Rash    Has patient had a PCN reaction causing immediate rash, facial/tongue/throat swelling, SOB or lightheadedness with hypotension: No Has patient had a PCN reaction causing severe rash involving mucus membranes or skin necrosis: No Has patient had a PCN reaction that required hospitalization: No Has patient had a  PCN reaction occurring within the last 10 years: No If all of the above answers are "NO", then may proceed with Cephalosporin use.     Labs:  Results for orders placed or performed during the hospital encounter of 07/20/18 (from the past 48 hour(s))  Comprehensive metabolic panel     Status: Abnormal   Collection Time: 07/20/18  3:15 AM  Result Value Ref Range   Sodium 136 135 - 145 mmol/L   Potassium 4.1 3.5 - 5.1 mmol/L   Chloride 105 98 - 111 mmol/L   CO2 21 (L) 22 - 32 mmol/L   Glucose, Bld 184 (H) 70 - 99 mg/dL   BUN 18 8 - 23 mg/dL   Creatinine, Ser 1.16 (H) 0.44 - 1.00 mg/dL   Calcium 9.0 8.9 - 10.3 mg/dL   Total Protein 7.3 6.5 - 8.1 g/dL   Albumin 4.2 3.5 - 5.0 g/dL   AST 39 15 - 41 U/L   ALT 26 0 - 44 U/L   Alkaline Phosphatase 44 38 - 126 U/L   Total Bilirubin 0.7 0.3 - 1.2 mg/dL   GFR calc non Af Amer 47 (L) >60 mL/min   GFR calc Af Amer 54 (L) >60 mL/min   Anion gap 10 5 - 15    Comment: Performed at [facility], [address]  [place], [place] [address]  Ethanol     Status: None   Collection Time: 07/20/18  3:15 AM  Result Value Ref Range   Alcohol, Ethyl (B) <10 <10 mg/dL    Comment: (NOTE) Lowest detectable limit for serum alcohol is 10 mg/dL. For medical purposes only. Performed at [facility], [address] [place], [place] [address]   Salicylate level     Status: None   Collection Time: 07/20/18  3:15 AM  Result Value Ref Range   Salicylate Lvl <1.4 2.8 - 30.0 mg/dL    Comment: Performed at [facility], [address] [place], [place] [address]  Acetaminophen level     Status: Abnormal   Collection Time: 07/20/18  3:15 AM  Result Value Ref Range   Acetaminophen (Tylenol), Serum <10 (L) 10 - 30 ug/mL    Comment: (NOTE) Therapeutic concentrations vary significantly. A range of 10-30 ug/mL  may be an effective concentration for many patients. However, some  are best treated at concentrations outside of this range. Acetaminophen concentrations >150 ug/mL at 4 hours after ingestion  and >50 ug/mL at 12 hours after ingestion are often associated with  toxic reactions. Performed at [facility], [address] [place], [place] [address]   cbc     Status: None   Collection Time: 07/20/18  3:15 AM  Result Value Ref Range   WBC 8.0 4.0 - 10.5 K/uL   RBC 4.73 3.87 - 5.11 MIL/uL   Hemoglobin 13.4 12.0 - 15.0 g/dL   HCT 41.1 36.0 - 46.0 %   MCV 86.9 80.0 - 100.0 fL   MCH 28.3 26.0 - 34.0 pg   MCHC 32.6 30.0 - 36.0 g/dL   RDW 12.7 11.5 - 15.5 %   Platelets 285 150 - 400 K/uL   nRBC 0.0 0.0 - 0.2 %    Comment: Performed at [facility], [address] [place], [place] [address]  Urine Drug Screen, Qualitative     Status: Abnormal   Collection Time: 07/20/18  3:55 AM  Result Value Ref Range   Tricyclic, Ur Screen POSITIVE (A) NONE DETECTED  Amphetamines, Ur Screen NONE DETECTED NONE DETECTED   MDMA (Ecstasy)Ur Screen NONE DETECTED NONE DETECTED   Cocaine  Metabolite,Ur [place] NONE DETECTED NONE DETECTED   Opiate, Ur Screen NONE DETECTED NONE DETECTED   Phencyclidine (PCP) Ur S NONE DETECTED NONE DETECTED   Cannabinoid 50 Ng, Ur [place] NONE DETECTED NONE DETECTED   Barbiturates, Ur Screen NONE DETECTED NONE DETECTED   Benzodiazepine, Ur Scrn NONE DETECTED NONE DETECTED   Methadone Scn, Ur NONE DETECTED NONE DETECTED    Comment: (NOTE) Tricyclics + metabolites, urine    Cutoff 1000 ng/mL Amphetamines + metabolites, urine  Cutoff 1000 ng/mL MDMA (Ecstasy), urine              Cutoff 500 ng/mL Cocaine Metabolite, urine          Cutoff 300 ng/mL Opiate + metabolites, urine        Cutoff 300 ng/mL Phencyclidine (PCP), urine         Cutoff 25 ng/mL Cannabinoid, urine                 Cutoff 50 ng/mL Barbiturates + metabolites, urine  Cutoff 200 ng/mL Benzodiazepine, urine              Cutoff 200 ng/mL Methadone, urine                   Cutoff 300 ng/mL The urine drug screen provides only a preliminary, unconfirmed analytical test result and should not be used for non-medical purposes. Clinical consideration and professional judgment should be applied to any positive drug screen result due to possible interfering substances. A more specific alternate chemical method must be used in order to obtain a confirmed analytical result. Gas chromatography / mass spectrometry (GC/MS) is the preferred confirmat ory method. Performed at [facility], [address] , [place] [address]     Current Facility-Administered Medications  Medication Dose Route Frequency Provider Last Rate Last Dose  . enalapril (VASOTEC) tablet 20 mg  20 mg Oral BID Paulette Blanch, MD   20 mg at 07/20/18 0915  . FLUoxetine (PROZAC) capsule 20 mg  20 mg Oral Daily Paulette Blanch, MD   20 mg at 07/20/18 0915  . glipiZIDE (GLUCOTROL XL) 24 hr tablet 5 mg  5 mg Oral BID Paulette Blanch, MD   5 mg at 07/20/18 8185  . levothyroxine (SYNTHROID, LEVOTHROID) tablet 75 mcg  75 mcg  Oral Geanie Kenning, MD   75 mcg at 07/20/18 0915  . loratadine (CLARITIN) tablet 10 mg  10 mg Oral Daily Carrie Mew, MD   10 mg at 07/20/18 6314  . prazosin (MINIPRESS) capsule 2 mg  2 mg Oral Geanie Kenning, MD   2 mg at 07/20/18 0916  . simvastatin (ZOCOR) tablet 20 mg  20 mg Oral Daily Paulette Blanch, MD   20 mg at 07/20/18 9702   Current Outpatient Medications  Medication Sig Dispense Refill  . cetirizine (ZYRTEC) 10 MG tablet Take 10 mg by mouth daily.    . clonazePAM (KLONOPIN) 1 MG tablet Take 1 tablet (1 mg total) by mouth at bedtime. 30 tablet 0  . enalapril (VASOTEC) 20 MG tablet Take 1 tablet (20 mg total) by mouth 2 (two) times daily. 30 tablet 0  . FLUoxetine (PROZAC) 20 MG capsule Take 1 capsule (20 mg total) by mouth daily. 30 capsule 3  . glipiZIDE (GLUCOTROL XL) 5 MG 24 hr tablet Take 5 mg by  mouth 2 (two) times daily.     Marland Kitchen levothyroxine (SYNTHROID, LEVOTHROID) 75 MCG tablet Take 1 tablet (75 mcg total) by mouth every morning. 30 tablet 0  . Olopatadine HCl 0.2 % SOLN Place 1 drop into both eyes daily.    . prazosin (MINIPRESS) 2 MG capsule Take 1 capsule (2 mg total) by mouth every morning. 30 capsule 1  . QUEtiapine (SEROQUEL) 100 MG tablet Take 1 tablet (100 mg total) by mouth at bedtime. 30 tablet 1  . simvastatin (ZOCOR) 20 MG tablet Take 1 tablet (20 mg total) by mouth daily. 30 tablet 0  . traZODone (DESYREL) 100 MG tablet Take 1 tablet (100 mg total) by mouth at bedtime. 30 tablet 1    Musculoskeletal: Strength & Muscle Tone: within normal limits Gait & Station: normal, uses walker for support Patient leans: N/A  Psychiatric Specialty Exam: Physical Exam  Nursing note and vitals reviewed. Constitutional: She is oriented to person, place, and time. She appears well-developed and well-nourished. No distress.  HENT:  Head: Normocephalic and atraumatic.  Eyes: EOM are normal.  Neck: Normal range of motion.  Cardiovascular: Normal rate.   Respiratory: Effort normal. No respiratory distress.  Musculoskeletal: Normal range of motion.  Neurological: She is alert and oriented to person, place, and time.  Skin: Skin is warm and dry.  Psychiatric: Her affect is labile. Her speech is rapid and/or pressured and tangential. She is agitated. She is not aggressive. Thought content is paranoid. Cognition and memory are impaired. She expresses impulsivity and inappropriate judgment. She expresses homicidal and suicidal ideation. She expresses no suicidal plans and no homicidal plans.    Review of Systems  Constitutional: Negative.   HENT: Negative.   Respiratory: Negative.   Cardiovascular: Negative.   Gastrointestinal: Negative.   Musculoskeletal: Negative.   Neurological: Positive for dizziness. Negative for sensory change.  Psychiatric/Behavioral: Positive for memory loss. Negative for depression, hallucinations, substance abuse and suicidal ideas. The patient is nervous/anxious and has insomnia.     Blood pressure 135/78, height 5' (1.524 m), weight 72.6 kg.Body mass index is 31.25 kg/m.  General Appearance: Disheveled  Eye Contact:  Fair  Speech:  Clear and Coherent, Pressured and Rapid  Volume:  Increased  Mood:  Anxious  Affect:  Inappropriate  Thought Process:  Goal Directed and Descriptions of Associations: Tangential  Orientation:  Full (Time, Place, and Person)  Thought Content:  Illogical, Rumination and Tangential  Suicidal Thoughts:  No  Homicidal Thoughts:  No  Memory:  Immediate;   Poor Recent;   Fair Remote;   Fair  Judgement:  Impaired  Insight:  Shallow  Psychomotor Activity:  Decreased  Concentration:  Concentration: Poor  Recall:  [employer] of Knowledge:  Fair  Language:  Fair  Akathisia:  No  Handed:  Right  AIMS (if indicated):     Assets:  Desire for Improvement Housing  ADL's:  Impaired  Cognition:  Impaired,  Mild  Sleep:   Patient complains that her insomnia     Treatment Plan  Summary: Daily contact with patient to assess and evaluate symptoms and progress in treatment and Medication management  [name] is a 73 y.o. female with PTSD and bipolar disorder who presents with mania after recent inpatient psychiatric stabilization.  Patient has benzodiazepine seeking.  She agrees to be admitted to the psychiatric ward for stabilization.  She agrees to the plan.   Increased Prozac to 40 mg daily for anxiety and depression. Discontinue Minipress 2  mg in the morning, as this may be contributing to her daytime dizziness.  Patient has a chronic sleep complaint, would consider antipsychotic or sedating mood stabilizing agent to provide mood stability as well and has improved sleep.   Depakote ER loading dose 1500 mg at bedtime 07/20/2018 followed by Depakote ER 1000 mg at bedtime for mood stability. Check Depakote level on Sunday, 07/24/2018.   Disposition: Recommend psychiatric Inpatient admission when medically cleared. Supportive therapy provided about ongoing stressors.   Lavella Hammock, MD 07/20/2018 12:31 PM

## 2018-07-20 NOTE — ED Notes (Signed)
Assisted patient to bathroom. Pt was unstable, gave her a walker to help with mobility.AS

## 2018-07-20 NOTE — ED Triage Notes (Signed)
Pt arrives voluntary from home with c/o feeling anxious and not well. Pt reports multiple medical history and states that she "physically and mentally feels like I'm dyin". Pt states that she "just feels helpless". Pt reports that she knew "where my thoughts are going and I've been there before.
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
## 2018-07-20 NOTE — ED Notes (Signed)
Vol/Pending placement  

## 2018-07-20 NOTE — BH Assessment (Signed)
Patient is to be admitted to [facility] by Dr. Leverne Humbles.  Attending Physician will be Dr. Weber Cooks.   Patient has been assigned to room 309, by [facility].   Intake Paper Work has been signed and placed on patient chart.  ER staff is aware of the admission:  Lattie Haw, ER Secretary    Dr. Joni Fears, ER MD   Enid Derry, Patient's Nurse   Elberta Fortis, Patient Access.

## 2018-07-20 NOTE — BH Assessment (Signed)
Per Cyril Mourning (Asst. Director), transport pt to [facility] BMU in the AM (due to staffing ratio).

## 2018-07-20 NOTE — ED Notes (Signed)
Patient called [employer] to bring her to the hospital. She is having some mental issues. "Knows the train it takes, and direction those thoughts go and the next stop is self harm" so she came here to be seen before it got to that.

## 2018-07-20 NOTE — ED Provider Notes (Signed)
[facility] Emergency Department Provider Note   ____________________________________________   First MD Initiated Contact with Patient 07/20/18 [phone]     (approximate)  I have reviewed the triage vital signs and the nursing notes.   HISTORY  Chief Complaint Depression    HPI [name] is a 73 y.o. female brought to the ED from home by [employer] voluntarily for behavioral health evaluation.  Patient recently discharged from [facility] and states she is feeling anxious and depressed.  Denies active SI/HI/AH/VH.  Voices no medical complaints.  States she "just feels helpless".       Past Medical History:  Diagnosis Date  . Arthritis   . Cancer [facility]) 2004   right breast ca  . Diabetes mellitus without complication ([place])   . Hypercholesterolemia   . Hypertension   . Hypothyroid   . Seasonal allergies     Patient Active Problem List   Diagnosis Date Noted  . Bipolar I disorder, most recent episode depressed with anxious distress ([place]) 07/07/2017  . PTSD (post-traumatic stress disorder) 07/07/2017  . 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 Diabetes ([place]) 09/23/2014  . Essential hypertension 09/21/2014  . Hypothyroidism 09/21/2014  . Dyslipidemia 09/21/2014  . Bipolar I disorder, current or most recent episode manic, severe with mixed features ([place]) 09/20/2014    Past Surgical History:  Procedure Laterality Date  . ABDOMINAL HYSTERECTOMY    . BREAST BIOPSY Left    neg  . BREAST EXCISIONAL BIOPSY Right 2004   breast ca and lumpectomy  . BREAST LUMPECTOMY Right 2004   breast ca  . CHOLECYSTECTOMY    . COLONOSCOPY WITH PROPOFOL N/A 02/13/2017   Procedure: COLONOSCOPY WITH PROPOFOL;  Surgeon: Jonathon Bellows, MD;  Location: [facility] ENDOSCOPY;  Service: Gastroenterology;  Laterality: N/A;  . KIDNEY STONE SURGERY      Prior to Admission medications   Medication Sig Start Date End Date Taking?  Authorizing Provider  cetirizine (ZYRTEC) 10 MG tablet Take 10 mg by mouth daily.    [provider]  clonazePAM (KLONOPIN) 1 MG tablet Take 1 tablet (1 mg total) by mouth at bedtime. 02/09/18   Pucilowska, Jolanta B, MD  enalapril (VASOTEC) 20 MG tablet Take 1 tablet (20 mg total) by mouth 2 (two) times daily. 09/23/14   Pucilowska, Herma Ard B, MD  FLUoxetine (PROZAC) 20 MG capsule Take 1 capsule (20 mg total) by mouth daily. 02/09/18   Pucilowska, Jolanta B, MD  glipiZIDE (GLUCOTROL XL) 5 MG 24 hr tablet Take 5 mg by mouth 2 (two) times daily.  01/14/17   [provider]  levothyroxine (SYNTHROID, LEVOTHROID) 75 MCG tablet Take 1 tablet (75 mcg total) by mouth every morning. 09/23/14   Pucilowska, Jolanta B, MD  Olopatadine HCl 0.2 % SOLN Place 1 drop into both eyes daily. 06/09/18   [provider]  prazosin (MINIPRESS) 2 MG capsule Take 1 capsule (2 mg total) by mouth every morning. 02/10/18   Pucilowska, Herma Ard B, MD  QUEtiapine (SEROQUEL) 100 MG tablet Take 1 tablet (100 mg total) by mouth at bedtime. 02/09/18   Pucilowska, Jolanta B, MD  simvastatin (ZOCOR) 20 MG tablet Take 1 tablet (20 mg total) by mouth daily. 09/23/14   Pucilowska, Herma Ard B, MD  traZODone (DESYREL) 100 MG tablet Take 1 tablet (100 mg total) by mouth at bedtime. 02/09/18   Pucilowska, Wardell Honour, MD    Allergies Navane [thiothixene] and Penicillins  Family History  Problem Relation  Age of Onset  . Bladder Cancer Neg Hx   . Kidney cancer Neg Hx     Social History Social History   Tobacco Use  . Smoking status: Former Smoker    Types: Cigarettes  . Smokeless tobacco: Never Used  Substance Use Topics  . Alcohol use: No  . Drug use: No    Review of Systems  Constitutional: No fever/chills Eyes: No visual changes. ENT: No sore throat. Cardiovascular: Denies chest pain. Respiratory: Denies shortness of breath. Gastrointestinal: No abdominal pain.  No nausea, no vomiting.  No diarrhea.  No  constipation. Genitourinary: Negative for dysuria. Musculoskeletal: Negative for back pain. Skin: Negative for rash. Neurological: Negative for headaches, focal weakness or numbness. Psychiatric:  Positive for anxiety and depression.  ____________________________________________   PHYSICAL EXAM:  VITAL SIGNS: ED Triage Vitals [07/20/18 0308]  Enc Vitals Group     BP      Pulse      Resp      Temp      Temp src      SpO2      Weight 160 lb (72.6 kg)     Height 5' (1.524 m)     Head Circumference      Peak Flow      Pain Score 0     Pain Loc      Pain Edu?      Excl. in [place]?     Constitutional: Alert and oriented. Well appearing and in no acute distress. Eyes: Conjunctivae are normal. PERRL. EOMI. Head: Atraumatic. Nose: No congestion/rhinnorhea. Mouth/Throat: Mucous membranes are moist.  Oropharynx non-erythematous. Neck: No stridor.   Cardiovascular: Normal rate, regular rhythm. Grossly normal heart sounds.  Good peripheral circulation. Respiratory: Normal respiratory effort.  No retractions. Lungs CTAB. Gastrointestinal: Soft and nontender. No distention. No abdominal bruits. No CVA tenderness. Musculoskeletal: No lower extremity tenderness nor edema.  No joint effusions. Neurologic:  Normal speech and language. No gross focal neurologic deficits are appreciated. No gait instability. Skin:  Skin is warm, dry and intact. No rash noted. Psychiatric: Mood and affect are normal. Speech and behavior are normal.  ____________________________________________   LABS (all labs ordered are listed, but only abnormal results are displayed)  Labs Reviewed  COMPREHENSIVE METABOLIC PANEL - Abnormal; Notable for the following components:      Result Value   CO2 21 (*)    Glucose, Bld 184 (*)    Creatinine, Ser 1.16 (*)    GFR calc non Af Amer 47 (*)    GFR calc Af Amer 54 (*)    All other components within normal limits  ACETAMINOPHEN LEVEL - Abnormal; Notable for the  following components:   Acetaminophen (Tylenol), Serum <10 (*)    All other components within normal limits  URINE DRUG SCREEN, QUALITATIVE ([facility] ONLY) - Abnormal; Notable for the following components:   Tricyclic, Ur Screen POSITIVE (*)    All other components within normal limits  ETHANOL  SALICYLATE LEVEL  CBC   ____________________________________________  EKG  None ____________________________________________  RADIOLOGY  ED MD interpretation: None  Official radiology report(s): No results found.  ____________________________________________   PROCEDURES  Procedure(s) performed (including Critical Care):  Procedures   ____________________________________________   INITIAL IMPRESSION / ASSESSMENT AND PLAN / ED COURSE  As part of my medical decision making, I reviewed the following data within the [place] notes reviewed and incorporated, Labs reviewed, Old chart reviewed, A consult was requested and obtained from this/these  consultant(s) Psychiatry and Notes from prior ED visits        73 year old female who presents to the ED for behavioral medicine evaluation.  Recently discharged from [facility].  Denies active SI/HI/AH/VH.  Will remain in the ED voluntarily pending psychiatry evaluation and disposition.  Clinical Course as of Jul 19 613  Mon Jul 20, 2018  0615 Patient is medically cleared for psychiatric evaluation and disposition.   [JS]    Clinical Course User Index [JS] Paulette Blanch, MD     ____________________________________________   FINAL CLINICAL IMPRESSION(S) / ED DIAGNOSES  Final diagnoses:  Moderate episode of recurrent major depressive disorder ([place])  Anxiety     ED Discharge Orders    None       Note:  This document was prepared using Dragon voice recognition software and may include unintentional dictation errors.   Paulette Blanch, MD 07/20/18 [phone]

## 2018-07-21 ENCOUNTER — Other Ambulatory Visit: Payer: Self-pay

## 2018-07-21 ENCOUNTER — Inpatient Hospital Stay
Admission: AD | Admit: 2018-07-21 | Discharge: 2018-07-27 | DRG: 885 | Disposition: A | Discharge status: Home / Self Care | Payer: Medicare Other | Source: Intra-hospital | Attending: Psychiatry | Admitting: Psychiatry

## 2018-07-21 DIAGNOSIS — Z7984 Long term (current) use of oral hypoglycemic drugs: Secondary | ICD-10-CM | POA: Diagnosis not present

## 2018-07-21 DIAGNOSIS — Z880 Allergy status to penicillin: Secondary | ICD-10-CM

## 2018-07-21 DIAGNOSIS — G2401 Drug induced subacute dyskinesia: Secondary | ICD-10-CM | POA: Diagnosis present

## 2018-07-21 DIAGNOSIS — F3112 Bipolar disorder, current episode manic without psychotic features, moderate: Principal | ICD-10-CM

## 2018-07-21 DIAGNOSIS — Z888 Allergy status to other drugs, medicaments and biological substances status: Secondary | ICD-10-CM | POA: Diagnosis not present

## 2018-07-21 DIAGNOSIS — E7800 Pure hypercholesterolemia, unspecified: Secondary | ICD-10-CM | POA: Diagnosis present

## 2018-07-21 DIAGNOSIS — R45851 Suicidal ideations: Secondary | ICD-10-CM | POA: Diagnosis present

## 2018-07-21 DIAGNOSIS — Z9049 Acquired absence of other specified parts of digestive tract: Secondary | ICD-10-CM | POA: Diagnosis not present

## 2018-07-21 DIAGNOSIS — E039 Hypothyroidism, unspecified: Secondary | ICD-10-CM | POA: Diagnosis present

## 2018-07-21 DIAGNOSIS — Z853 Personal history of malignant neoplasm of breast: Secondary | ICD-10-CM

## 2018-07-21 DIAGNOSIS — E119 Type 2 diabetes mellitus without complications: Secondary | ICD-10-CM | POA: Diagnosis present

## 2018-07-21 DIAGNOSIS — I10 Essential (primary) hypertension: Secondary | ICD-10-CM | POA: Diagnosis present

## 2018-07-21 DIAGNOSIS — E1169 Type 2 diabetes mellitus with other specified complication: Secondary | ICD-10-CM

## 2018-07-21 DIAGNOSIS — Z90710 Acquired absence of both cervix and uterus: Secondary | ICD-10-CM | POA: Diagnosis not present

## 2018-07-21 DIAGNOSIS — Z79890 Hormone replacement therapy (postmenopausal): Secondary | ICD-10-CM

## 2018-07-21 DIAGNOSIS — F3163 Bipolar disorder, current episode mixed, severe, without psychotic features: Secondary | ICD-10-CM | POA: Diagnosis present

## 2018-07-21 DIAGNOSIS — M1990 Unspecified osteoarthritis, unspecified site: Secondary | ICD-10-CM | POA: Diagnosis present

## 2018-07-21 DIAGNOSIS — Z87891 Personal history of nicotine dependence: Secondary | ICD-10-CM

## 2018-07-21 DIAGNOSIS — Z79899 Other long term (current) drug therapy: Secondary | ICD-10-CM | POA: Diagnosis not present

## 2018-07-21 DIAGNOSIS — E785 Hyperlipidemia, unspecified: Secondary | ICD-10-CM

## 2018-07-21 LAB — LIPID PANEL
Cholesterol: 167 mg/dL (ref 0–200)
HDL: 48 mg/dL (ref >40)
LDL CALC: 75 mg/dL (ref 0–99)
Total CHOL/HDL Ratio: 3.5 RATIO
Triglycerides: 221 mg/dL — ABNORMAL HIGH (ref <150)
VLDL: 44 mg/dL — ABNORMAL HIGH (ref 0–40)

## 2018-07-21 LAB — HEMOGLOBIN A1C
Hgb A1c MFr Bld: 9.6 % — ABNORMAL HIGH (ref 4.8–5.6)
Mean Plasma Glucose: 228.82 mg/dL

## 2018-07-21 LAB — VALPROIC ACID LEVEL: Valproic Acid Lvl: 57 ug/mL (ref 50.0–100.0)

## 2018-07-21 LAB — TSH: TSH: 0.275 u[IU]/mL — ABNORMAL LOW (ref 0.350–4.500)

## 2018-07-21 LAB — GLUCOSE, CAPILLARY: Glucose-Capillary: 194 mg/dL — ABNORMAL HIGH (ref 70–99)

## 2018-07-21 MED ORDER — GLIPIZIDE ER 5 MG PO TB24: 5.0000 mg | ORAL_TABLET | Freq: Two times a day (BID) | ORAL | Status: DC

## 2018-07-21 MED ORDER — LORATADINE 10 MG PO TABS: 10.0000 mg | ORAL_TABLET | Freq: Every day | ORAL | Status: DC

## 2018-07-21 MED ORDER — ALUM & MAG HYDROXIDE-SIMETH 200-200-20 MG/5ML PO SUSP: 30.0000 mL | ORAL | Status: DC | PRN

## 2018-07-21 MED ORDER — SIMVASTATIN 40 MG PO TABS
20.0000 mg | ORAL_TABLET | Freq: Every day | ORAL | Status: DC
  Administered 2018-07-21 – 2018-07-27 (×7): 20 mg via ORAL
  Filled 2018-07-21 (×7): qty 1

## 2018-07-21 MED ORDER — FLUOXETINE HCL 20 MG PO CAPS
40.0000 mg | ORAL_CAPSULE | Freq: Every day | ORAL | Status: DC
  Administered 2018-07-22 – 2018-07-27 (×6): 40 mg via ORAL
  Filled 2018-07-21 (×7): qty 2

## 2018-07-21 MED ORDER — MAGNESIUM HYDROXIDE 400 MG/5ML PO SUSP: 30.0000 mL | Freq: Every day | ORAL | Status: DC | PRN

## 2018-07-21 MED ORDER — LEVOTHYROXINE SODIUM 75 MCG PO TABS
75.0000 ug | ORAL_TABLET | ORAL | Status: DC
  Administered 2018-07-22 – 2018-07-27 (×6): 75 ug via ORAL
  Filled 2018-07-21 (×6): qty 1

## 2018-07-21 MED ORDER — LEVOTHYROXINE SODIUM 75 MCG PO TABS: 75.0000 ug | ORAL_TABLET | ORAL | Status: DC

## 2018-07-21 MED ORDER — ENALAPRIL MALEATE 20 MG PO TABS: 20.0000 mg | ORAL_TABLET | Freq: Two times a day (BID) | ORAL | Status: DC

## 2018-07-21 MED ORDER — HYDROXYZINE HCL 25 MG PO TABS
50.0000 mg | ORAL_TABLET | Freq: Once | ORAL | Status: AC
  Administered 2018-07-21: 50 mg via ORAL
  Filled 2018-07-21: qty 2

## 2018-07-21 MED ORDER — ACETAMINOPHEN 325 MG PO TABS
650.0000 mg | ORAL_TABLET | Freq: Four times a day (QID) | ORAL | Status: DC | PRN
  Administered 2018-07-22 – 2018-07-26 (×4): 650 mg via ORAL
  Filled 2018-07-21 (×4): qty 2

## 2018-07-21 MED ORDER — ENALAPRIL MALEATE 20 MG PO TABS
20.0000 mg | ORAL_TABLET | Freq: Two times a day (BID) | ORAL | Status: DC
  Administered 2018-07-21 – 2018-07-27 (×12): 20 mg via ORAL
  Filled 2018-07-21 (×14): qty 1

## 2018-07-21 MED ORDER — LORATADINE 10 MG PO TABS
10.0000 mg | ORAL_TABLET | Freq: Every day | ORAL | Status: DC
  Administered 2018-07-22 – 2018-07-27 (×6): 10 mg via ORAL
  Filled 2018-07-21 (×8): qty 1

## 2018-07-21 MED ORDER — INSULIN ASPART 100 UNIT/ML ~~LOC~~ SOLN
0.0000 [IU] | Freq: Three times a day (TID) | SUBCUTANEOUS | Status: DC
  Administered 2018-07-21 – 2018-07-22 (×3): 3 [IU] via SUBCUTANEOUS
  Administered 2018-07-22: 2 [IU] via SUBCUTANEOUS
  Administered 2018-07-23: 3 [IU] via SUBCUTANEOUS
  Administered 2018-07-23: 2 [IU] via SUBCUTANEOUS
  Administered 2018-07-23: 3 [IU] via SUBCUTANEOUS
  Administered 2018-07-24: 5 [IU] via SUBCUTANEOUS
  Administered 2018-07-24 – 2018-07-25 (×2): 3 [IU] via SUBCUTANEOUS
  Administered 2018-07-25: 5 [IU] via SUBCUTANEOUS
  Administered 2018-07-26: 3 [IU] via SUBCUTANEOUS
  Administered 2018-07-26: 2 [IU] via SUBCUTANEOUS
  Administered 2018-07-27: 3 [IU] via SUBCUTANEOUS
  Filled 2018-07-21 (×11): qty 1

## 2018-07-21 MED ORDER — FLUOXETINE HCL 20 MG PO CAPS: 40.0000 mg | ORAL_CAPSULE | Freq: Every day | ORAL | Status: DC

## 2018-07-21 MED ORDER — CLONAZEPAM 1 MG PO TABS
1.0000 mg | ORAL_TABLET | Freq: Every day | ORAL | Status: DC
  Administered 2018-07-21 – 2018-07-26 (×6): 1 mg via ORAL
  Filled 2018-07-21 (×6): qty 1

## 2018-07-21 MED ORDER — GLIPIZIDE ER 5 MG PO TB24
5.0000 mg | ORAL_TABLET | Freq: Two times a day (BID) | ORAL | Status: DC
  Administered 2018-07-21 – 2018-07-22 (×2): 5 mg via ORAL
  Filled 2018-07-21 (×3): qty 1

## 2018-07-21 MED ORDER — ACETAMINOPHEN 325 MG PO TABS: 650.0000 mg | ORAL_TABLET | Freq: Four times a day (QID) | ORAL | Status: DC | PRN

## 2018-07-21 MED ORDER — DIVALPROEX SODIUM ER 500 MG PO TB24
1000.0000 mg | ORAL_TABLET | Freq: Every day | ORAL | Status: DC
  Administered 2018-07-22 – 2018-07-26 (×5): 1000 mg via ORAL
  Filled 2018-07-21 (×5): qty 2

## 2018-07-21 MED ORDER — HYDROXYZINE HCL 25 MG PO TABS
25.0000 mg | ORAL_TABLET | Freq: Four times a day (QID) | ORAL | Status: DC | PRN
  Administered 2018-07-21 – 2018-07-22 (×2): 25 mg via ORAL
  Filled 2018-07-21 (×3): qty 1

## 2018-07-21 MED ORDER — OLOPATADINE HCL 0.1 % OP SOLN
1.0000 [drp] | Freq: Two times a day (BID) | OPHTHALMIC | Status: DC
  Administered 2018-07-21 – 2018-07-27 (×12): 1 [drp] via OPHTHALMIC
  Filled 2018-07-21 (×2): qty 5

## 2018-07-21 MED ORDER — DIVALPROEX SODIUM ER 500 MG PO TB24
1500.0000 mg | ORAL_TABLET | Freq: Every day | ORAL | Status: AC
  Administered 2018-07-21: 1500 mg via ORAL
  Filled 2018-07-21: qty 3

## 2018-07-21 MED ORDER — HYDROXYZINE HCL 50 MG PO TABS: 50.0000 mg | ORAL_TABLET | Freq: Four times a day (QID) | ORAL | Status: DC | PRN

## 2018-07-21 MED ORDER — SIMVASTATIN 40 MG PO TABS: 20.0000 mg | ORAL_TABLET | Freq: Every day | ORAL | Status: DC

## 2018-07-21 NOTE — ED Notes (Signed)
Pt given breakfast tray. Pt used hand sanitizer in hallway prior to eating. Maintained on 15 minute checks and observation by security camera for safety.

## 2018-07-21 NOTE — ED Notes (Signed)
Pt remains awake. Asking to sign a "72 hour hold" so that she can go home. Explained to patient that was not a process that was done in the ED. Pt asking to see the nurse practitioner. Will ask Kennyth Lose, NP to see. Pt has been awake all shift and per report of day shirt RN pt was awake all day. Will also ask the NP for orders for anxiety and rest.

## 2018-07-21 NOTE — BHH Group Notes (Signed)
Feelings Around Diagnosis 07/21/2018 1PM  Type of Therapy/Topic:  Group Therapy:  Feelings about Diagnosis  Participation Level:  Minimal   Description of Group:   This group will allow patients to explore their thoughts and feelings about diagnoses they have received. Patients will be guided to explore their level of understanding and acceptance of these diagnoses. Facilitator will encourage patients to process their thoughts and feelings about the reactions of others to their diagnosis and will guide patients in identifying ways to discuss their diagnosis with significant others in their lives. This group will be process-oriented, with patients participating in exploration of their own experiences, giving and receiving support, and processing challenge from other group members.   Therapeutic Goals: 1. Patient will demonstrate understanding of diagnosis as evidenced by identifying two or more symptoms of the disorder 2. Patient will be able to express two feelings regarding the diagnosis 3. Patient will demonstrate their ability to communicate their needs through discussion and/or role play  Summary of Patient Progress:  Minimal participation. Pt came to group late and nurse took her from group early.     Therapeutic Modalities:   Cognitive Behavioral Therapy Brief Therapy Feelings Identification    Yvette Rack, LCSW 07/21/2018 2:24 PM

## 2018-07-21 NOTE — ED Notes (Signed)
Patient discharged to BMU.  VS stable. Report called to [place], Therapist, sports.  Pt has signed voluntary consent form.  Belongings sent with patient.

## 2018-07-21 NOTE — H&P (Signed)
Psychiatric Admission Assessment Adult  Patient Identification: [name] MRN:  [id number] Date of Evaluation:  07/21/2018 Chief Complaint:  Bipolar 1 Disorder Principal Diagnosis: Bipolar 1 disorder, manic, moderate ([place]) Diagnosis:  Principal Problem:   Bipolar 1 disorder, manic, moderate ([place]) Active Problems:   Essential hypertension   Hypothyroidism   Diabetes ([place])   Bipolar 1 disorder, mixed, severe ([place])  History of Present Illness: This is a patient well-known to myself and the psychiatric service with a long history of mood disorder.  She is admitted under IVC to the psychiatric ward because of her presentation with disorganized thinking flight of ideas very labile and agitated mood with suicidal thoughts expressed.  Patient's course seems to be 1 of going slightly up and then farther down over the last couple of months.  She was in our emergency room just about 14 days ago and was sent to an inpatient psychiatric ward at [facility].  She got out of there this past weekend.  Almost immediately she was back in our emergency room with mood instability.  Sent home once she now comes back again.  On interview today the patient is labile and difficult to interview.  She immediately launches into talking about the proximity of her sister is making her suicidal.  She is able to admit there is nothing really rational about this but become so worked up she starts sobbing uncontrollably to the point that she has saliva running down the front of her clothing.  She then pulls it back together and starts ranting about other topics.  Very disorganized thinking although with care you can keep her directed enough to get a reasonably lucid picture.  She denies that she is having any hallucinations.  She says she is having suicidal thoughts although she cannot articulate why and has no specific plan.  Denies homicidal ideation.  Not sleeping well at night.  Evidently her recent medicine after  going to [facility] included Seroquel and Prozac.  Patient had responded well to Seroquel in the past but now believes it is making the twitching in her face worse and cannot stand it.  Her Klonopin which she has been dependent on for a long time has been alternately given and taken away.  Denies alcohol or drug abuse. Associated Signs/Symptoms: Depression Symptoms:  depressed mood, psychomotor agitation, feelings of worthlessness/guilt, difficulty concentrating, suicidal thoughts without plan, (Hypo) Manic Symptoms:  Elevated Mood, Flight of Ideas, Impulsivity, Anxiety Symptoms:  Excessive Worry, Psychotic Symptoms:  Paranoia, PTSD Symptoms: Negative Total Time spent with patient: 1 hour  Past Psychiatric History: Patient has a long history of bipolar disorder.  Frequent visits to the emergency room.  Also over the life span multiple hospitalizations.  Patient tends to prefer to be on antidepressant medicines and at times she is able to be stable on just antidepressants but she occasionally will get mixed syndromes or manic syndromes in which antipsychotics and mood stabilizers become necessary.  She is now feeling that all antipsychotics make her have dystonic symptoms or worsen her tardive dyskinesia.  She has a history of suicidal threats in the past.  I am not aware of her actually making any serious attempts.  No history of violence to others.  Is the patient at risk to self? Yes.    Has the patient been a risk to self in the past 6 months? Yes.    Has the patient been a risk to self within the distant past? Yes.    Is  the patient a risk to others? No.  Has the patient been a risk to others in the past 6 months? No.  Has the patient been a risk to others within the distant past? No.   Prior Inpatient Therapy:   Prior Outpatient Therapy:    Alcohol Screening: 1. How often do you have a drink containing alcohol?: Never 2. How many drinks containing alcohol do you have on a  typical day when you are drinking?: 1 or 2 3. How often do you have six or more drinks on one occasion?: Never AUDIT-C Score: 0 4. How often during the last year have you found that you were not able to stop drinking once you had started?: Never 5. How often during the last year have you failed to do what was normally expected from you becasue of drinking?: Never 6. How often during the last year have you needed a first drink in the morning to get yourself going after a heavy drinking session?: Never 7. How often during the last year have you had a feeling of guilt of remorse after drinking?: Never 8. How often during the last year have you been unable to remember what happened the night before because you had been drinking?: Never 9. Have you or someone else been injured as a result of your drinking?: No 10. Has a relative or friend or a doctor or another health worker been concerned about your drinking or suggested you cut down?: No Alcohol Use Disorder Identification Test Final Score (AUDIT): 0 Alcohol Brief Interventions/Follow-up: AUDIT Score <7 follow-up not indicated, Continued Monitoring Substance Abuse History in the last 12 months:  No. Consequences of Substance Abuse: Negative Previous Psychotropic Medications: Yes  Psychological Evaluations: Yes  Past Medical History:  Past Medical History:  Diagnosis Date  . Arthritis   . Cancer [facility]) 2004   right breast ca  . Diabetes mellitus without complication ([place])   . Hypercholesterolemia   . Hypertension   . Hypothyroid   . Seasonal allergies     Past Surgical History:  Procedure Laterality Date  . ABDOMINAL HYSTERECTOMY    . BREAST BIOPSY Left    neg  . BREAST EXCISIONAL BIOPSY Right 2004   breast ca and lumpectomy  . BREAST LUMPECTOMY Right 2004   breast ca  . CHOLECYSTECTOMY    . COLONOSCOPY WITH PROPOFOL N/A 02/13/2017   Procedure: COLONOSCOPY WITH PROPOFOL;  Surgeon: Jonathon Bellows, MD;  Location: [facility] ENDOSCOPY;   Service: Gastroenterology;  Laterality: N/A;  . KIDNEY STONE SURGERY     Family History:  Family History  Problem Relation Age of Onset  . Bladder Cancer Neg Hx   . Kidney cancer Neg Hx    Family Psychiatric  History: Positive for bipolar disorder Tobacco Screening:   Social History:  Social History   Substance and Sexual Activity  Alcohol Use No     Social History   Substance and Sexual Activity  Drug Use No    Additional Social History:                           Allergies:   Allergies  Allergen Reactions  . Navane [Thiothixene] Other (See Comments)    Reaction:  Unknown  . Penicillins Rash    Has patient had a PCN reaction causing immediate rash, facial/tongue/throat swelling, SOB or lightheadedness with hypotension: No Has patient had a PCN reaction causing severe rash involving mucus membranes or skin necrosis: No Has  patient had a PCN reaction that required hospitalization: No Has patient had a PCN reaction occurring within the last 10 years: No If all of the above answers are "NO", then may proceed with Cephalosporin use.    Lab Results:  Results for orders placed or performed during the hospital encounter of 07/21/18 (from the past 48 hour(s))  Valproic acid level     Status: None   Collection Time: 07/21/18  1:44 PM  Result Value Ref Range   Valproic Acid Lvl 57 50.0 - 100.0 ug/mL    Comment: Performed at [facility], [address] [place], [place] [address]  Hemoglobin A1c     Status: Abnormal   Collection Time: 07/21/18  1:44 PM  Result Value Ref Range   Hgb A1c MFr Bld 9.6 (H) 4.8 - 5.6 %    Comment: (NOTE) Pre diabetes:          5.7%-6.4% Diabetes:              >6.4% Glycemic control for   <7.0% adults with diabetes    Mean Plasma Glucose 228.82 mg/dL    Comment: Performed at [facility], [place] [address], [place], [place] [address]  Lipid panel     Status: Abnormal   Collection Time: 07/21/18  1:44 PM  Result Value  Ref Range   Cholesterol 167 0 - 200 mg/dL   Triglycerides 221 (H) <150 mg/dL   HDL 48 >40 mg/dL   Total CHOL/HDL Ratio 3.5 RATIO   VLDL 44 (H) 0 - 40 mg/dL   LDL Cholesterol 75 0 - 99 mg/dL    Comment:        Total Cholesterol/HDL:CHD Risk Coronary Heart Disease Risk Table                     Men   Women  1/2 Average Risk   3.4   3.3  Average Risk       5.0   4.4  2 X Average Risk   9.6   7.1  3 X Average Risk  23.4   11.0        Use the calculated Patient Ratio above and the CHD Risk Table to determine the patient's CHD Risk.        ATP III CLASSIFICATION (LDL):  <100     mg/dL   Optimal  100-129  mg/dL   Near or Above                    Optimal  130-159  mg/dL   Borderline  160-189  mg/dL   High  >190     mg/dL   Very High Performed at [facility], [address] [place], [place] [address]   TSH     Status: Abnormal   Collection Time: 07/21/18  1:44 PM  Result Value Ref Range   TSH 0.275 (L) 0.350 - 4.500 uIU/mL    Comment: Performed by a 3rd Generation assay with a functional sensitivity of <=0.01 uIU/mL. Performed at [facility], [address] [place], [place] [address]   Glucose, capillary     Status: Abnormal   Collection Time: 07/21/18  4:12 PM  Result Value Ref Range   Glucose-Capillary 194 (H) 70 - 99 mg/dL   Comment 1 Notify RN    Comment 2 Document in Chart     Blood Alcohol level:  Lab Results  Component Value Date   ETH <10 07/20/2018  ETH <10 99/24/2683    Metabolic Disorder Labs:  Lab Results  Component Value Date   HGBA1C 9.6 (H) 07/21/2018   MPG 228.82 07/21/2018   MPG 240 07/07/2018   No results found for: PROLACTIN Lab Results  Component Value Date   CHOL 167 07/21/2018   TRIG 221 (H) 07/21/2018   HDL 48 07/21/2018   CHOLHDL 3.5 07/21/2018   VLDL 44 (H) 07/21/2018   LDLCALC 75 07/21/2018   LDLCALC 93 07/07/2018    Current Medications: Current Facility-Administered Medications  Medication Dose Route  Frequency Provider Last Rate Last Dose  . acetaminophen (TYLENOL) tablet 650 mg  650 mg Oral Q6H PRN Lavella Hammock, MD      . alum & mag hydroxide-simeth (MAALOX/MYLANTA) 200-200-20 MG/5ML suspension 30 mL  30 mL Oral Q4H PRN Lavella Hammock, MD      . clonazePAM Bobbye [place]) tablet 1 mg  1 mg Oral QHS Malai Lady T, MD      . divalproex (DEPAKOTE ER) 24 hr tablet 1,500 mg  1,500 mg Oral QHS Lavella Hammock, MD       Followed by  . [START ON 07/22/2018] divalproex (DEPAKOTE ER) 24 hr tablet 1,000 mg  1,000 mg Oral QHS Lavella Hammock, MD      . enalapril (VASOTEC) tablet 20 mg  20 mg Oral BID Lavella Hammock, MD      . FLUoxetine (PROZAC) capsule 40 mg  40 mg Oral Daily Lavella Hammock, MD      . glipiZIDE (GLUCOTROL XL) 24 hr tablet 5 mg  5 mg Oral BID Lavella Hammock, MD      . hydrOXYzine (ATARAX/VISTARIL) tablet 25 mg  25 mg Oral Q6H PRN Lavella Hammock, MD   25 mg at 07/21/18 1515  . insulin aspart (novoLOG) injection 0-15 Units  0-15 Units Subcutaneous TID WC Lavella Hammock, MD      . Derrill Memo ON 07/22/2018] levothyroxine (SYNTHROID, LEVOTHROID) tablet 75 mcg  75 mcg Oral Doroteo Bradford, MD      . loratadine (CLARITIN) tablet 10 mg  10 mg Oral Daily Lavella Hammock, MD      . magnesium hydroxide (MILK OF MAGNESIA) suspension 30 mL  30 mL Oral Daily PRN Lavella Hammock, MD      . olopatadine (PATANOL) 0.1 % ophthalmic solution 1 drop  1 drop Both Eyes BID Lavella Hammock, MD      . simvastatin (ZOCOR) tablet 20 mg  20 mg Oral Daily Lavella Hammock, MD       PTA Medications: Medications Prior to Admission  Medication Sig Dispense Refill Last Dose  . cetirizine (ZYRTEC) 10 MG tablet Take 10 mg by mouth daily.   Past Week at Unknown time  . clonazePAM (KLONOPIN) 1 MG tablet Take 1 tablet (1 mg total) by mouth at bedtime. 30 tablet 0 Past Week at Unknown time  . enalapril (VASOTEC) 20 MG tablet Take 1 tablet (20 mg total) by mouth 2 (two) times daily. 30 tablet 0 Past Week  at Unknown time  . FLUoxetine (PROZAC) 20 MG capsule Take 1 capsule (20 mg total) by mouth daily. 30 capsule 3 Past Week at Unknown time  . glipiZIDE (GLUCOTROL XL) 5 MG 24 hr tablet Take 5 mg by mouth 2 (two) times daily.    Past Week at Unknown time  . levothyroxine (SYNTHROID, LEVOTHROID) 75 MCG tablet Take 1 tablet (75 mcg total) by mouth every morning. [place]  tablet 0 Past Week at Unknown time  . Olopatadine HCl 0.2 % SOLN Place 1 drop into both eyes daily.   Past Week at Unknown time  . prazosin (MINIPRESS) 2 MG capsule Take 1 capsule (2 mg total) by mouth every morning. 30 capsule 1 Past Week at Unknown time  . QUEtiapine (SEROQUEL) 100 MG tablet Take 1 tablet (100 mg total) by mouth at bedtime. 30 tablet 1 Past Week at Unknown time  . simvastatin (ZOCOR) 20 MG tablet Take 1 tablet (20 mg total) by mouth daily. 30 tablet 0 Past Week at Unknown time  . traZODone (DESYREL) 100 MG tablet Take 1 tablet (100 mg total) by mouth at bedtime. 30 tablet 1 Past Week at Unknown time    Musculoskeletal: Strength & Muscle Tone: within normal limits Gait & Station: normal Patient leans: N/A  Psychiatric Specialty Exam: Physical Exam  Nursing note and vitals reviewed. Constitutional: She appears well-developed and well-nourished.  HENT:  Head: Normocephalic and atraumatic.  Eyes: Pupils are equal, round, and reactive to light. Conjunctivae are normal.  Neck: Normal range of motion.  Cardiovascular: Normal heart sounds.  Respiratory: Effort normal.  GI: Soft.  Musculoskeletal: Normal range of motion.  Neurological: She is alert.  Skin: Skin is warm and dry.  Psychiatric: Her mood appears anxious. Her affect is labile and inappropriate. Her affect is not blunt. Her speech is rapid and/or pressured and tangential. She is agitated. She is not aggressive. Thought content is paranoid. Cognition and memory are impaired. She expresses impulsivity. She expresses suicidal ideation. She expresses no suicidal  plans.    Review of Systems  Constitutional: Negative.   HENT: Negative.   Eyes: Negative.   Respiratory: Negative.   Cardiovascular: Negative.   Gastrointestinal: Negative.   Musculoskeletal: Negative.   Skin: Negative.   Neurological: Negative.   Psychiatric/Behavioral: Positive for depression and suicidal ideas. Negative for hallucinations, memory loss and substance abuse. The patient is nervous/anxious and has insomnia.     Blood pressure (!) 170/89, pulse 77, temperature 98.1 F (36.7 C), temperature source Oral, resp. rate 18, height 5' (1.524 m), weight 72.6 kg, SpO2 98 %.Body mass index is 31.25 kg/m.  General Appearance: Casual  Eye Contact:  Good  Speech:  Pressured  Volume:  Increased  Mood:  Angry, Anxious and Depressed  Affect:  Inappropriate and Labile  Thought Process:  Disorganized  Orientation:  Full (Time, Place, and Person)  Thought Content:  Illogical, Paranoid Ideation, Rumination and Tangential  Suicidal Thoughts:  Yes.  without intent/plan  Homicidal Thoughts:  No  Memory:  Immediate;   Fair Recent;   Fair Remote;   Fair  Judgement:  Fair  Insight:  Fair  Psychomotor Activity:  Restlessness  Concentration:  Concentration: Poor  Recall:  Poor  Fund of Knowledge:  Poor  Language:  Fair  Akathisia:  No  Handed:  Right  AIMS (if indicated):     Assets:  Communication Skills Desire for Improvement Financial Resources/Insurance Housing Social Support  ADL's:  Intact  Cognition:  Impaired,  Mild  Sleep:       Treatment Plan Summary: Daily contact with patient to assess and evaluate symptoms and progress in treatment, Medication management and Plan Patient is manic although she has enough insight that she is able to cooperate with treatment.  We discussed medication management and concluded that she will cooperate with a trial of full dose Depakote.  We will avoid antipsychotics for now.  Continue her Prozac which she  has valued so much in the past  and has not previously seem to be destabilizing for her.  I am restarting her Klonopin at night which should help with her sleep and agitation somewhat.  I am not aware of her having abused her medicine in the past or having addiction problems.  Patient will be engaged in individual and group therapy and a full treatment plan with follow-up will be made before she is discharged.  Observation Level/Precautions:  15 minute checks  Laboratory:  Chemistry Profile  Psychotherapy:    Medications:    Consultations:    Discharge Concerns:    Estimated LOS:  Other:     Physician Treatment Plan for Primary Diagnosis: Bipolar 1 disorder, manic, moderate ([place]) Long Term Goal(s): Improvement in symptoms so as ready for discharge  Short Term Goals: Ability to verbalize feelings will improve, Ability to disclose and discuss suicidal ideas and Ability to demonstrate self-control will improve  Physician Treatment Plan for Secondary Diagnosis: Principal Problem:   Bipolar 1 disorder, manic, moderate ([facility]) Active Problems:   Essential hypertension   Hypothyroidism   Diabetes ([place])   Bipolar 1 disorder, mixed, severe ([place])  Long Term Goal(s): Improvement in symptoms so as ready for discharge  Short Term Goals: Ability to maintain clinical measurements within normal limits will improve  I certify that inpatient services furnished can reasonably be expected to improve the patient's condition.    Alethia Berthold, MD 3/3/20204:25 PM

## 2018-07-21 NOTE — ED Notes (Signed)
Report called to Caroline, RN

## 2018-07-21 NOTE — Plan of Care (Signed)
  Problem: Education: Goal: Knowledge of [facility] Education information/materials will improve Note:  Instructed  patient on unit programing [place] Education, able to verbalize  understanding

## 2018-07-21 NOTE — ED Provider Notes (Signed)
-----------------------------------------   7:22 AM on 07/21/2018 -----------------------------------------  ----------------------------------------- 7:22 AM on 07/21/2018 -----------------------------------------   Blood pressure 129/76, height 5' (1.524 m), weight 72.6 kg.  The patient is calm and cooperative at this time.  There have been no acute events since the last update.  Awaiting disposition plan from Behavioral Medicine team.    Schuyler Amor, MD 07/21/18 [phone]

## 2018-07-21 NOTE — Tx Team (Signed)
Initial Treatment Plan 07/21/2018 2:41 PM Karryn Shalina [place] WHQ:[id number]    PATIENT STRESSORS: Marital or family conflict Medication change or noncompliance   PATIENT STRENGTHS: Capable of independent living Communication skills Special hobby/interest   PATIENT IDENTIFIED PROBLEMS: Alteration in mood ([place] Depression) "I have tried to stay away from my family especially my sister because she drags me down".  Medication noncompliance "They give me Seroquel here and at [facility] which is not good for me, it makes me shakes inside and outside".                   DISCHARGE CRITERIA:  Improved stabilization in mood, thinking, and/or behavior Verbal commitment to aftercare and medication compliance  PRELIMINARY DISCHARGE PLAN: Outpatient therapy Return to previous living arrangement  PATIENT/FAMILY INVOLVEMENT: This treatment plan has been presented to and reviewed with the patient. The patient have been given the opportunity to ask questions and make suggestions.  Keane Police, RN 07/21/2018, 2:41 PM

## 2018-07-21 NOTE — Progress Notes (Signed)
Patient ID: [name], female   DOB: 08/01/1945, 73 y.o.   MRN: [id number] Discussed with patient nurse Ms. Cales RN that the patient is awake and wanting to go home. Vistaril 50 mg po x 1 now for agitation was order. The patient did voiced that she did not want to leave tonight, but she was inquiring about going home.

## 2018-07-21 NOTE — ED Notes (Signed)
Pt refused a shower at this time. Staff will offer a shower again later today. Maintained on 15 minute checks and observation by security camera for safety.

## 2018-07-21 NOTE — Progress Notes (Signed)
DAR Note: Pt is a 73 y/o female who presents to [facility] ED for with complaints of depression and anxiety. Pt assessed and chart reviewed. Per pt "I just left [facility] last Friday, I was there for 21 days, my depression got worse and the Seroquel was not working for me". Pt A & O 4 on initial contact. Denies SI, HI, AVH and pain "not right now". Presents anxious, restless, tearful during assessment when talking about her relationship with her sister. "she has stressed me out, even after I told her to stay away from me". Reports strained relationship with her children since her divorce in 78s. Per pt "I have my own apartment, I can live on my little checks I get, I'm fine". States she has no support from her family at present and "I'm fine with that". Skin assessment done and belongings searched per protocol. Scab X2 noted on left elbow "I hit my elbow on the door at home". All items deemed contraband secured in locker. Unit orientation done, routines discussed and care plan reviewed with pt, understanding verbalized. Pt encouraged to voice concerns, attend to ADLS and comply with current treatment plan including groups. Q 15 minutes safety checks continues without self harm gestures or outburst. POC initiated for safety and mood stability.

## 2018-07-21 NOTE — ED Notes (Signed)
NP Kennyth Lose in to see the patient.

## 2018-07-21 NOTE — BHH Suicide Risk Assessment (Signed)
[facility] Admission Suicide Risk Assessment   Nursing information obtained from:  Patient Demographic factors:  Female, Caucasian Current Mental Status:  NA Loss Factors:  NA Historical Factors:  NA Risk Reduction Factors:  NA  Total Time spent with patient: 1 hour Principal Problem: Bipolar 1 disorder, manic, moderate (HCC) Diagnosis:  Principal Problem:   Bipolar 1 disorder, manic, moderate (HCC) Active Problems:   Essential hypertension   Hypothyroidism   Diabetes ([place])   Bipolar 1 disorder, mixed, severe (HCC)  Subjective Data: Patient seen.  Chart reviewed.  Patient with a history of bipolar disorder admitted through the emergency room with agitation mood lability racing thoughts suicidal ideation.  Patient denies any plan to kill herself but talks frequently about how overwhelmed she is.  Adequate insight.  No homicidal ideation.  Continued Clinical Symptoms:  Alcohol Use Disorder Identification Test Final Score (AUDIT): 0 The "Alcohol Use Disorders Identification Test", Guidelines for Use in Primary Care, Second Edition.  World Pharmacologist [facility]). Score between 0-7:  no or low risk or alcohol related problems. Score between 8-15:  moderate risk of alcohol related problems. Score between 16-19:  high risk of alcohol related problems. Score 20 or above:  warrants further diagnostic evaluation for alcohol dependence and treatment.   CLINICAL FACTORS:   Bipolar Disorder:   Mixed State   Musculoskeletal: Strength & Muscle Tone: within normal limits Gait & Station: normal Patient leans: N/A  Psychiatric Specialty Exam: Physical Exam  Nursing note and vitals reviewed. Constitutional: She appears well-developed and well-nourished.  HENT:  Head: Normocephalic and atraumatic.  Eyes: Pupils are equal, round, and reactive to light. Conjunctivae are normal.  Neck: Normal range of motion.  Cardiovascular: Regular rhythm and normal heart sounds.  Respiratory: Effort normal.   GI: Soft.  Musculoskeletal: Normal range of motion.  Neurological: She is alert.  Skin: Skin is warm and dry.  Psychiatric: Her mood appears anxious. Her affect is labile and inappropriate. Her speech is rapid and/or pressured and tangential. She is agitated. She is not aggressive. Thought content is paranoid. Cognition and memory are impaired. She expresses impulsivity and inappropriate judgment. She expresses suicidal ideation. She expresses no suicidal plans.    Review of Systems  Constitutional: Negative.   HENT: Negative.   Eyes: Negative.   Respiratory: Negative.   Cardiovascular: Negative.   Gastrointestinal: Negative.   Musculoskeletal: Negative.   Skin: Negative.   Neurological: Negative.   Psychiatric/Behavioral: Positive for depression, memory loss and suicidal ideas. Negative for hallucinations and substance abuse. The patient is nervous/anxious and has insomnia.     Blood pressure (!) 170/89, pulse 77, temperature 98.1 F (36.7 C), temperature source Oral, resp. rate 18, height 5' (1.524 m), weight 72.6 kg, SpO2 98 %.Body mass index is 31.25 kg/m.  General Appearance: Casual  Eye Contact:  Good  Speech:  Pressured  Volume:  Increased  Mood:  Angry, Anxious and Depressed  Affect:  Inappropriate, Labile and Tearful  Thought Process:  Disorganized  Orientation:  Full (Time, Place, and Person)  Thought Content:  Illogical, Paranoid Ideation, Rumination and Tangential  Suicidal Thoughts:  Yes.  without intent/plan  Homicidal Thoughts:  No  Memory:  Immediate;   Fair Recent;   Fair Remote;   Fair  Judgement:  Impaired  Insight:  Shallow  Psychomotor Activity:  Restlessness  Concentration:  Concentration: Poor  Recall:  [employer] of Knowledge:  Fair  Language:  Fair  Akathisia:  No  Handed:  Right  AIMS (if  indicated):     Assets:  Communication Skills Desire for Improvement Housing  ADL's:  Impaired  Cognition:  Impaired,  Mild  Sleep:          COGNITIVE FEATURES THAT CONTRIBUTE TO RISK:  Closed-mindedness    SUICIDE RISK:   Mild:  Suicidal ideation of limited frequency, intensity, duration, and specificity.  There are no identifiable plans, no associated intent, mild dysphoria and related symptoms, good self-control (both objective and subjective assessment), few other risk factors, and identifiable protective factors, including available and accessible social support.  PLAN OF CARE: Patient admitted to the psychiatric ward.  Continue 15-minute checks.  Continue daily assessment of suicidality along with overall mood state.  Initiate mood stabilizing medicines to address current symptoms.  Continue daily assessment and make sure we have a solid plan at discharge  I certify that inpatient services furnished can reasonably be expected to improve the patient's condition.   Alethia Berthold, MD 07/21/2018, 4:21 PM

## 2018-07-22 LAB — GLUCOSE, CAPILLARY
GLUCOSE-CAPILLARY: 130 mg/dL — AB (ref 70–99)
Glucose-Capillary: 163 mg/dL — ABNORMAL HIGH (ref 70–99)
Glucose-Capillary: 175 mg/dL — ABNORMAL HIGH (ref 70–99)
Glucose-Capillary: 146 mg/dL — ABNORMAL HIGH (ref 70–99)

## 2018-07-22 MED ORDER — TRAZODONE HCL 100 MG PO TABS
100.0000 mg | ORAL_TABLET | Freq: Every day | ORAL | Status: DC
  Administered 2018-07-23 (×2): 100 mg via ORAL
  Filled 2018-07-22 (×4): qty 1

## 2018-07-22 MED ORDER — GLIPIZIDE ER 10 MG PO TB24
10.0000 mg | ORAL_TABLET | Freq: Two times a day (BID) | ORAL | Status: DC
  Administered 2018-07-22 – 2018-07-27 (×10): 10 mg via ORAL
  Filled 2018-07-22 (×11): qty 1

## 2018-07-22 NOTE — Progress Notes (Signed)
[facility] MD Progress Note  07/22/2018 4:08 PM [name]  MRN:  [id number] Subjective: Follow-up for this patient with bipolar disorder.  Patient today tells me she is feeling "fabulous".  She says she is much better than yesterday.  She certainly is calmer and not nearly as labile.  Not having crying spells.  On the other hand she does seem a little bit euphoric.  She has some insight into this though.  She has been compliant with medicine.  No new complaints about the medication.  Agreeable to appropriate disposition ultimately.  Blood sugars only slightly elevated. Principal Problem: Bipolar 1 disorder, manic, moderate (HCC) Diagnosis: Principal Problem:   Bipolar 1 disorder, manic, moderate (HCC) Active Problems:   Essential hypertension   Hypothyroidism   Diabetes (HCC)   Bipolar 1 disorder, mixed, severe (HCC)  Total Time spent with patient: 30 minutes  Past Psychiatric History: Past history of recurrent bipolar disorder with some difficulty tolerating and staying compliant to medicine  Past Medical History:  Past Medical History:  Diagnosis Date  . Arthritis   . Cancer [facility]) 2004   right breast ca  . Diabetes mellitus without complication ([place])   . Hypercholesterolemia   . Hypertension   . Hypothyroid   . Seasonal allergies     Past Surgical History:  Procedure Laterality Date  . ABDOMINAL HYSTERECTOMY    . BREAST BIOPSY Left    neg  . BREAST EXCISIONAL BIOPSY Right 2004   breast ca and lumpectomy  . BREAST LUMPECTOMY Right 2004   breast ca  . CHOLECYSTECTOMY    . COLONOSCOPY WITH PROPOFOL N/A 02/13/2017   Procedure: COLONOSCOPY WITH PROPOFOL;  Surgeon: Jonathon Bellows, MD;  Location: [facility] ENDOSCOPY;  Service: Gastroenterology;  Laterality: N/A;  . KIDNEY STONE SURGERY     Family History:  Family History  Problem Relation Age of Onset  . Bladder Cancer Neg Hx   . Kidney cancer Neg Hx    Family Psychiatric  History: See previous Social History:  Social  History   Substance and Sexual Activity  Alcohol Use No     Social History   Substance and Sexual Activity  Drug Use No    Social History   Socioeconomic History  . Marital status: Divorced    Spouse name: Not on file  . Number of children: Not on file  . Years of education: Not on file  . Highest education level: Not on file  Occupational History  . Not on file  Social Needs  . Financial resource strain: Not on file  . Food insecurity:    Worry: Not on file    Inability: Not on file  . Transportation needs:    Medical: Not on file    Non-medical: Not on file  Tobacco Use  . Smoking status: Former Smoker    Types: Cigarettes  . Smokeless tobacco: Never Used  Substance and Sexual Activity  . Alcohol use: No  . Drug use: No  . Sexual activity: Not Currently  Lifestyle  . Physical activity:    Days per week: Not on file    Minutes per session: Not on file  . Stress: Not on file  Relationships  . Social connections:    Talks on phone: Not on file    Gets together: Not on file    Attends religious service: Not on file    Active member of club or organization: Not on file    Attends meetings of clubs or organizations: Not  on file    Relationship status: Not on file  Other Topics Concern  . Not on file  Social History Narrative  . Not on file   Additional Social History:                         Sleep: Fair  Appetite:  Fair  Current Medications: Current Facility-Administered Medications  Medication Dose Route Frequency Provider Last Rate Last Dose  . acetaminophen (TYLENOL) tablet 650 mg  650 mg Oral Q6H PRN Lavella Hammock, MD   650 mg at 07/22/18 1308  . alum & mag hydroxide-simeth (MAALOX/MYLANTA) 200-200-20 MG/5ML suspension 30 mL  30 mL Oral Q4H PRN Lavella Hammock, MD      . clonazePAM Bobbye Charleston) tablet 1 mg  1 mg Oral QHS Jalissa Heinzelman, Madie Reno, MD   1 mg at 07/21/18 2217  . divalproex (DEPAKOTE ER) 24 hr tablet 1,000 mg  1,000 mg Oral QHS  Lavella Hammock, MD      . enalapril (VASOTEC) tablet 20 mg  20 mg Oral BID Lavella Hammock, MD   20 mg at 07/22/18 0756  . FLUoxetine (PROZAC) capsule 40 mg  40 mg Oral Daily Lavella Hammock, MD   40 mg at 07/22/18 0756  . glipiZIDE (GLUCOTROL XL) 24 hr tablet 5 mg  5 mg Oral BID Lavella Hammock, MD   5 mg at 07/22/18 0756  . hydrOXYzine (ATARAX/VISTARIL) tablet 25 mg  25 mg Oral Q6H PRN Lavella Hammock, MD   25 mg at 07/21/18 1515  . insulin aspart (novoLOG) injection 0-15 Units  0-15 Units Subcutaneous TID WC Lavella Hammock, MD   2 Units at 07/22/18 1217  . levothyroxine (SYNTHROID, LEVOTHROID) tablet 75 mcg  75 mcg Oral Doroteo Bradford, MD   75 mcg at 07/22/18 0620  . loratadine (CLARITIN) tablet 10 mg  10 mg Oral Daily Lavella Hammock, MD   10 mg at 07/22/18 0756  . magnesium hydroxide (MILK OF MAGNESIA) suspension 30 mL  30 mL Oral Daily PRN Lavella Hammock, MD      . olopatadine (PATANOL) 0.1 % ophthalmic solution 1 drop  1 drop Both Eyes BID Lavella Hammock, MD   1 drop at 07/22/18 0757  . simvastatin (ZOCOR) tablet 20 mg  20 mg Oral Daily Lavella Hammock, MD   20 mg at 07/22/18 5732    Lab Results:  Results for orders placed or performed during the hospital encounter of 07/21/18 (from the past 48 hour(s))  Valproic acid level     Status: None   Collection Time: 07/21/18  1:44 PM  Result Value Ref Range   Valproic Acid Lvl 57 50.0 - 100.0 ug/mL    Comment: Performed at [facility], [address] [place], [place] [address]  Hemoglobin A1c     Status: Abnormal   Collection Time: 07/21/18  1:44 PM  Result Value Ref Range   Hgb A1c MFr Bld 9.6 (H) 4.8 - 5.6 %    Comment: (NOTE) Pre diabetes:          5.7%-6.4% Diabetes:              >6.4% Glycemic control for   <7.0% adults with diabetes    Mean Plasma Glucose 228.82 mg/dL    Comment: Performed at [facility], [place] [address] [place], [place] [address]  Lipid panel     Status: Abnormal  Collection Time: 07/21/18  1:44 PM  Result Value Ref Range   Cholesterol 167 0 - 200 mg/dL   Triglycerides 221 (H) <150 mg/dL   HDL 48 >40 mg/dL   Total CHOL/HDL Ratio 3.5 RATIO   VLDL 44 (H) 0 - 40 mg/dL   LDL Cholesterol 75 0 - 99 mg/dL    Comment:        Total Cholesterol/HDL:CHD Risk Coronary Heart Disease Risk Table                     Men   Women  1/2 Average Risk   3.4   3.3  Average Risk       5.0   4.4  2 X Average Risk   9.6   7.1  3 X Average Risk  23.4   11.0        Use the calculated Patient Ratio above and the CHD Risk Table to determine the patient's CHD Risk.        ATP III CLASSIFICATION (LDL):  <100     mg/dL   Optimal  100-129  mg/dL   Near or Above                    Optimal  130-159  mg/dL   Borderline  160-189  mg/dL   High  >190     mg/dL   Very High Performed at [facility], [address] [place], [place] [address]   TSH     Status: Abnormal   Collection Time: 07/21/18  1:44 PM  Result Value Ref Range   TSH 0.275 (L) 0.350 - 4.500 uIU/mL    Comment: Performed by a 3rd Generation assay with a functional sensitivity of <=0.01 uIU/mL. Performed at [facility], [address] [place], [place] [address]   Glucose, capillary     Status: Abnormal   Collection Time: 07/21/18  4:12 PM  Result Value Ref Range   Glucose-Capillary 194 (H) 70 - 99 mg/dL   Comment 1 Notify RN    Comment 2 Document in Chart   Glucose, capillary     Status: Abnormal   Collection Time: 07/22/18  7:03 AM  Result Value Ref Range   Glucose-Capillary 163 (H) 70 - 99 mg/dL   Comment 1 Notify RN   Glucose, capillary     Status: Abnormal   Collection Time: 07/22/18 11:45 AM  Result Value Ref Range   Glucose-Capillary 130 (H) 70 - 99 mg/dL   Comment 1 Notify RN     Blood Alcohol level:  Lab Results  Component Value Date   ETH <10 07/20/2018   ETH <10 37/90/2409    Metabolic Disorder Labs: Lab Results  Component Value Date   HGBA1C 9.6 (H)  07/21/2018   MPG 228.82 07/21/2018   MPG 240 07/07/2018   No results found for: PROLACTIN Lab Results  Component Value Date   CHOL 167 07/21/2018   TRIG 221 (H) 07/21/2018   HDL 48 07/21/2018   CHOLHDL 3.5 07/21/2018   VLDL 44 (H) 07/21/2018   LDLCALC 75 07/21/2018   LDLCALC 93 07/07/2018    Physical Findings: AIMS:  , ,  ,  ,    CIWA:  CIWA-Ar Total: 3 COWS:     Musculoskeletal: Strength & Muscle Tone: within normal limits Gait & Station: normal Patient leans: N/A  Psychiatric Specialty Exam: Physical Exam  Nursing note and vitals reviewed. Constitutional: She appears well-developed and well-nourished.  HENT:  Head: Normocephalic and atraumatic.  Eyes: Pupils are equal, round, and reactive to light. Conjunctivae are normal.  Neck: Normal range of motion.  Cardiovascular: Regular rhythm and normal heart sounds.  Respiratory: Effort normal.  GI: Soft.  Musculoskeletal: Normal range of motion.  Neurological: She is alert.  Skin: Skin is warm and dry.  Psychiatric: Her behavior is normal. Thought content normal. Her affect is labile. Her speech is tangential. Cognition and memory are impaired. She expresses impulsivity.    Review of Systems  Constitutional: Negative.   HENT: Negative.   Eyes: Negative.   Respiratory: Negative.   Cardiovascular: Negative.   Gastrointestinal: Negative.   Musculoskeletal: Negative.   Skin: Negative.   Neurological: Negative.   Psychiatric/Behavioral: Negative for depression, hallucinations, memory loss, substance abuse and suicidal ideas. The patient is nervous/anxious and has insomnia.     Blood pressure (!) 149/89, pulse 66, temperature 98.5 F (36.9 C), temperature source Oral, resp. rate 18, height 5' (1.524 m), weight 72.6 kg, SpO2 98 %.Body mass index is 31.25 kg/m.  General Appearance: Casual  Eye Contact:  Good  Speech:  Pressured  Volume:  Increased  Mood:  Euphoric  Affect:  Congruent  Thought Process:  Coherent   Orientation:  Full (Time, Place, and Person)  Thought Content:  Logical  Suicidal Thoughts:  No  Homicidal Thoughts:  No  Memory:  Immediate;   Fair Recent;   Fair Remote;   Fair  Judgement:  Fair  Insight:  Fair  Psychomotor Activity:  Increased  Concentration:  Concentration: Fair  Recall:  [employer] of Knowledge:  Fair  Language:  Fair  Akathisia:  No  Handed:  Right  AIMS (if indicated):     Assets:  Desire for Improvement Housing Resilience  ADL's:  Intact  Cognition:  WNL  Sleep:  Number of Hours: 7     Treatment Plan Summary: Daily contact with patient to assess and evaluate symptoms and progress in treatment, Medication management and Plan Continue current medication plan.  Depakote level coming up in a couple days.  Continue individual and group therapy and assessment.  I do not think she is quite ready for discharge as she is still at least hypomanic and needs stabilization.  I will readjust her diabetic medicine to help with the persistent slightly elevated blood sugars.  No other current change to medication.  Alethia Berthold, MD 07/22/2018, 4:08 PM
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
## 2018-07-22 NOTE — Progress Notes (Signed)
Recreation Therapy Notes  Date: 07/22/2018  Time: 9:30 am  Location: Craft Room  Behavioral response: Appropriate  Intervention Topic: Coping Skills  Discussion/Intervention:  Group content on today was focused on coping skills. The group defined what coping skills are and when they can be used. Individuals described how they normally cope with thing and the coping skills they normally use. Patients expressed why it is important to cope with things and how not coping with things can affect you. The group participated in the intervention "Exploring coping skills" where they had a chance to test new coping skills they could use in the future.  Clinical Observations/Feedback:  Patient came to group and identified music, reading and listing positive qualities as healthy coping skills she uses. She express that staying in the house hiding from the world is an unhealthy coping skill she participates in. Participant identified she need to work on setting boundaries to improve her coping skills. Individual was social with peers and staff while participating in group.  Cynithia Hakimi LRT/CTRS         Gunnar Hereford 07/22/2018 10:28 AM

## 2018-07-22 NOTE — BHH Group Notes (Signed)
[place] Group Notes:  (Nursing/MHT/Case Management/Adjunct)  Date:  07/22/2018  Time:  9:10 PM  Type of Therapy:  Group Therapy  Participation Level:  Active  Participation Quality:  Appropriate  Affect:  Appropriate  Cognitive:  Appropriate  Insight:  Appropriate  Engagement in Group:  Engaged  Modes of Intervention:  Discussion  Summary of Progress/Problems:  Kandis Fantasia 07/22/2018, 9:10 PM

## 2018-07-22 NOTE — Progress Notes (Signed)
Patient alert and oriented x 4, affect is blunted, appears less anxious, mood is receptive to staff, noted in the milieu interacting with peers and staff. Patient currently denies SI/HI/AVH and pain, patent makes appropriates eye contact, compliant with evening medication regimen. Patient was offered emotional support and encouraged to attend evening wrap up group. Patent didn't attend evening wrap up group. 15 minutes safety checks maintained, will continue to closely monitor.

## 2018-07-22 NOTE — Plan of Care (Signed)
Patient is alert and oriented X 3, denies SI, HI and AVH. Patient very animated and euphoric during assessment. Patient is pleasant; states I am doing good today." Patient rates pain 0/10, logical and coherent speech.  Patient eating and sleeping well. Participating in groups. No self injurious behaviors observed. Problem: Education: Goal: Knowledge of [place] [facility] Education information/materials will improve Outcome: Progressing   Problem: Coping: Goal: Coping ability will improve Outcome: Progressing Goal: Will verbalize feelings Outcome: Progressing   Problem: Safety: Goal: Ability to disclose and discuss suicidal ideas will improve Outcome: Progressing   Problem: Self-Concept: Goal: Ability to identify factors that promote anxiety will improve Outcome: Progressing Goal: Level of anxiety will decrease Outcome: Progressing Goal: Ability to modify response to factors that promote anxiety will improve Outcome: Progressing   Problem: Self-Concept: Goal: Level of anxiety will decrease Outcome: Progressing

## 2018-07-22 NOTE — Progress Notes (Signed)
Recreation Therapy Notes  INPATIENT RECREATION THERAPY ASSESSMENT  Patient Details Name: [name] MRN: [id number] DOB: 21-Oct-1945 Today's Date: 07/22/2018       Information Obtained From: Patient  Able to Participate in Assessment/Interview: Yes  Patient Presentation: Responsive  Reason for Admission (Per Patient): Active Symptoms, Other (Comments)(Being isolated)  Patient Stressors:    Coping Skills:   Building control surveyor, Music  Leisure Interests (2+):  Music - Listen, Individual - Reading, Crafts - Knitting/Crocheting, Crafts - Sewing  Frequency of Recreation/Participation: Monthly  Awareness of Community Resources:  Yes  Community Resources:  Other (Comment)([facility])  Current Use:    If no, Barriers?:    Expressed Interest in [employer] Information:    [place] of Residence:  [place]  Patient Main Form of Transportation: Taxi  Patient Strengths:  Determined  Patient Identified Areas of Improvement:  Stop being so isolated  Patient Goal for Hospitalization:  To get balanced in areas needed.  Current SI (including self-harm):  No  Current HI:  No  Current AVH: No  Staff Intervention Plan: Group Attendance, Collaborate with Interdisciplinary Treatment Team  Consent to Intern Participation: N/A  [name] 07/22/2018, 3:30 PM

## 2018-07-22 NOTE — Tx Team (Addendum)
Interdisciplinary Treatment and Diagnostic Plan Update  07/22/2018 Time of Session: 230pm [name] MRN: [id number]  Principal Diagnosis: Bipolar 1 disorder, manic, moderate ([place])  Secondary Diagnoses: Principal Problem:   Bipolar 1 disorder, manic, moderate (H[facility]) Active Problems:   Essential hypertension   Hypothyroidism   Diabetes ([place])   Bipolar 1 disorder, mixed, severe (HCC)   Current Medications:  Current Facility-Administered Medications  Medication Dose Route Frequency Provider Last Rate Last Dose  . acetaminophen (TYLENOL) tablet 650 mg  650 mg Oral Q6H PRN Lavella Hammock, MD   650 mg at 07/22/18 1308  . alum & mag hydroxide-simeth (MAALOX/MYLANTA) 200-200-20 MG/5ML suspension 30 mL  30 mL Oral Q4H PRN Lavella Hammock, MD      . clonazePAM Bobbye [place]) tablet 1 mg  1 mg Oral QHS Clapacs, Madie Reno, MD   1 mg at 07/21/18 2217  . divalproex (DEPAKOTE ER) 24 hr tablet 1,000 mg  1,000 mg Oral QHS Lavella Hammock, MD      . enalapril (VASOTEC) tablet 20 mg  20 mg Oral BID Lavella Hammock, MD   20 mg at 07/22/18 0756  . FLUoxetine (PROZAC) capsule 40 mg  40 mg Oral Daily Lavella Hammock, MD   40 mg at 07/22/18 0756  . glipiZIDE (GLUCOTROL XL) 24 hr tablet 5 mg  5 mg Oral BID Lavella Hammock, MD   5 mg at 07/22/18 0756  . hydrOXYzine (ATARAX/VISTARIL) tablet 25 mg  25 mg Oral Q6H PRN Lavella Hammock, MD   25 mg at 07/21/18 1515  . insulin aspart (novoLOG) injection 0-15 Units  0-15 Units Subcutaneous TID WC Lavella Hammock, MD   2 Units at 07/22/18 1217  . levothyroxine (SYNTHROID, LEVOTHROID) tablet 75 mcg  75 mcg Oral Doroteo Bradford, MD   75 mcg at 07/22/18 0620  . loratadine (CLARITIN) tablet 10 mg  10 mg Oral Daily Lavella Hammock, MD   10 mg at 07/22/18 0756  . magnesium hydroxide (MILK OF MAGNESIA) suspension 30 mL  30 mL Oral Daily PRN Lavella Hammock, MD      . olopatadine (PATANOL) 0.1 % ophthalmic solution 1 drop  1 drop Both Eyes BID Lavella Hammock, MD   1 drop at 07/22/18 0757  . simvastatin (ZOCOR) tablet 20 mg  20 mg Oral Daily Lavella Hammock, MD   20 mg at 07/22/18 5732   PTA Medications: Medications Prior to Admission  Medication Sig Dispense Refill Last Dose  . cetirizine (ZYRTEC) 10 MG tablet Take 10 mg by mouth daily.   Past Week at Unknown time  . clonazePAM (KLONOPIN) 1 MG tablet Take 1 tablet (1 mg total) by mouth at bedtime. 30 tablet 0 Past Week at Unknown time  . enalapril (VASOTEC) 20 MG tablet Take 1 tablet (20 mg total) by mouth 2 (two) times daily. 30 tablet 0 Past Week at Unknown time  . FLUoxetine (PROZAC) 20 MG capsule Take 1 capsule (20 mg total) by mouth daily. 30 capsule 3 Past Week at Unknown time  . glipiZIDE (GLUCOTROL XL) 5 MG 24 hr tablet Take 5 mg by mouth 2 (two) times daily.    Past Week at Unknown time  . levothyroxine (SYNTHROID, LEVOTHROID) 75 MCG tablet Take 1 tablet (75 mcg total) by mouth every morning. 30 tablet 0 Past Week at Unknown time  . Olopatadine HCl 0.2 % SOLN Place 1 drop into both eyes daily.   Past Week at  Unknown time  . prazosin (MINIPRESS) 2 MG capsule Take 1 capsule (2 mg total) by mouth every morning. 30 capsule 1 Past Week at Unknown time  . QUEtiapine (SEROQUEL) 100 MG tablet Take 1 tablet (100 mg total) by mouth at bedtime. 30 tablet 1 Past Week at Unknown time  . simvastatin (ZOCOR) 20 MG tablet Take 1 tablet (20 mg total) by mouth daily. 30 tablet 0 Past Week at Unknown time  . traZODone (DESYREL) 100 MG tablet Take 1 tablet (100 mg total) by mouth at bedtime. 30 tablet 1 Past Week at Unknown time    Patient Stressors: Marital or family conflict Medication change or noncompliance  Patient Strengths: Capable of independent living Communication skills Special hobby/interest  Treatment Modalities: Medication Management, Group therapy, Case management,  1 to 1 session with clinician, Psychoeducation, Recreational therapy.   Physician Treatment Plan for Primary  Diagnosis: Bipolar 1 disorder, manic, moderate ([place]) Long Term Goal(s): Improvement in symptoms so as ready for discharge Improvement in symptoms so as ready for discharge   Short Term Goals: Ability to verbalize feelings will improve Ability to disclose and discuss suicidal ideas Ability to demonstrate self-control will improve Ability to maintain clinical measurements within normal limits will improve  Medication Management: Evaluate patient's response, side effects, and tolerance of medication regimen.  Therapeutic Interventions: 1 to 1 sessions, Unit Group sessions and Medication administration.  Evaluation of Outcomes: Progressing  Physician Treatment Plan for Secondary Diagnosis: Principal Problem:   Bipolar 1 disorder, manic, moderate (HCC) Active Problems:   Essential hypertension   Hypothyroidism   Diabetes ([place])   Bipolar 1 disorder, mixed, severe ([place])  Long Term Goal(s): Improvement in symptoms so as ready for discharge Improvement in symptoms so as ready for discharge   Short Term Goals: Ability to verbalize feelings will improve Ability to disclose and discuss suicidal ideas Ability to demonstrate self-control will improve Ability to maintain clinical measurements within normal limits will improve     Medication Management: Evaluate patient's response, side effects, and tolerance of medication regimen.  Therapeutic Interventions: 1 to 1 sessions, Unit Group sessions and Medication administration.  Evaluation of Outcomes: Progressing   RN Treatment Plan for Primary Diagnosis: Bipolar 1 disorder, manic, moderate ([place]) Long Term Goal(s): Knowledge of disease and therapeutic regimen to maintain health will improve  Short Term Goals: Ability to demonstrate self-control, Ability to participate in decision making will improve, Ability to disclose and discuss suicidal ideas, Ability to identify and develop effective coping behaviors will improve and Compliance with  prescribed medications will improve  Medication Management: RN will administer medications as ordered by provider, will assess and evaluate patient's response and provide education to patient for prescribed medication. RN will report any adverse and/or side effects to prescribing provider.  Therapeutic Interventions: 1 on 1 counseling sessions, Psychoeducation, Medication administration, Evaluate responses to treatment, Monitor vital signs and CBGs as ordered, Perform/monitor CIWA, COWS, AIMS and Fall Risk screenings as ordered, Perform wound care treatments as ordered.  Evaluation of Outcomes: Progressing   LCSW Treatment Plan for Primary Diagnosis: Bipolar 1 disorder, manic, moderate ([place]) Long Term Goal(s): Safe transition to appropriate next level of care at discharge, Engage patient in therapeutic group addressing interpersonal concerns.  Short Term Goals: Engage patient in aftercare planning with referrals and resources  Therapeutic Interventions: Assess for all discharge needs, 1 to 1 time with Social worker, Explore available resources and support systems, Assess for adequacy in community support network, Educate family and significant other(s) on suicide  prevention, Complete Psychosocial Assessment, Interpersonal group therapy.  Evaluation of Outcomes: Progressing   Progress in Treatment: Attending groups: Yes. Participating in groups: Yes. Taking medication as prescribed: Yes. Toleration medication: Yes. Family/Significant other contact made: No, will contact:  pt declined Patient understands diagnosis: Yes. Discussing patient identified problems/goals with staff: Yes. Medical problems stabilized or resolved: Yes. Denies suicidal/homicidal ideation: Yes. Issues/concerns per patient self-inventory: No. Other: NA  New problem(s) identified: No, Describe:  none reported  New Short Term/Long Term Goal(s):"Get balanced, get my medication regulated so I don't have to come back  here"  Patient Goals:  "Get balanced, get my medication regulated so I don't have to come back here"  Discharge Plan or Barriers: Pt previously seen at [facility], unsure if she would like to resume services. Pt request a day or so to decide where she would like her after care to be.  Reason for Continuation of Hospitalization: Medication stabilization  Estimated Length of Stay:5-7 days  Recreational Therapy: Patient Stressors: N/A Patient Goal: Patient will focus on task/topic with 2 prompts from staff within 5 recreation therapy group sessions  Attendees: Patient:[name] 07/22/2018 3:37 PM  Physician: Alethia Berthold 07/22/2018 3:37 PM  Nursing:  07/22/2018 3:37 PM  RN Care Manager: 07/22/2018 3:37 PM  Social Worker: Sanjuana Kava Kirklin Tabiona 07/22/2018 3:37 PM  Recreational Therapist: Roanna Epley 07/22/2018 3:37 PM  Other:  07/22/2018 3:37 PM  Other:  07/22/2018 3:37 PM  Other: 07/22/2018 3:37 PM    Scribe for Treatment Team: Yvette Rack, LCSW 07/22/2018 3:37 PM

## 2018-07-22 NOTE — B[facility] Group Notes (Signed)
LCSW Group Therapy Note  07/22/2018 12:35 PM  Type of Therapy/Topic:  Group Therapy:  Emotion Regulation  Participation Level:  Did Not Attend   Description of Group:   The purpose of this group is to assist patients in learning to regulate negative emotions and experience positive emotions. Patients will be guided to discuss ways in which they have been vulnerable to their negative emotions. These vulnerabilities will be juxtaposed with experiences of positive emotions or situations, and patients will be challenged to use positive emotions to combat negative ones. Special emphasis will be placed on coping with negative emotions in conflict situations, and patients will process healthy conflict resolution skills.  Therapeutic Goals: 1. Patient will identify two positive emotions or experiences to reflect on in order to balance out negative emotions 2. Patient will label two or more emotions that they find the most difficult to experience 3. Patient will demonstrate positive conflict resolution skills through discussion and/or role plays  Summary of Patient Progress: x   Therapeutic Modalities:   Cognitive Behavioral Therapy Feelings Identification Dialectical Behavioral Therapy   Evalina Field, MSW, LCSW Clinical Social Work 07/22/2018 12:35 PM

## 2018-07-22 NOTE — [facility] Suicide Risk Assessment (Signed)
[place] INPATIENT:  Family/Significant Other Suicide Prevention Education  Suicide Prevention Education:  Patient Refusal for Family/Significant Other Suicide Prevention Education: The patient [name] has refused to provide written consent for family/significant other to be provided Family/Significant Other Suicide Prevention Education during admission and/or prior to discharge.  Physician notified.  Tyriq Moragne T Greydis Stlouis 07/22/2018, 10:21 AM

## 2018-07-22 NOTE — Plan of Care (Signed)
  Problem: Coping: Goal: Coping ability will improve Outcome: Progressing  Patient is able to use her coping skills when anxious.

## 2018-07-23 LAB — GLUCOSE, CAPILLARY
GLUCOSE-CAPILLARY: 81 mg/dL (ref 70–99)
GLUCOSE-CAPILLARY: 159 mg/dL — AB (ref 70–99)
Glucose-Capillary: 178 mg/dL — ABNORMAL HIGH (ref 70–99)
Glucose-Capillary: 159 mg/dL — ABNORMAL HIGH (ref 70–99)

## 2018-07-23 NOTE — Progress Notes (Signed)
D - Patient was in her room upon arrival to the unit. Patient was pleasant during assessment and medication administration. Patient denies SI/HI/AVH and pain. Patient rated her anxiety 7/10. See MAR. Patient was observed interacting appropriately with staff and peers on the unit.   A - Patient compliant with medication administration per MD orders. Patient given education. Patient given support and encouragement to be active in her treatment plan. Patient informed to let staff know if there are any issues or problems on the unit.   R - Patient being monitored Q 15 minutes for safety per unit protocol. Patient remains safe on the unit.

## 2018-07-23 NOTE — Progress Notes (Signed)
Recreation Therapy Notes  Date: 07/23/2018  Time: 9:30 am  Location: Craft Room  Behavioral response: Appropriate  Intervention Topic: Relaxation   Discussion/Intervention:  Group content today was focused on relaxation. The group defined relaxation and identified healthy ways to relax. Individuals expressed how much time they spend relaxing. Patients expressed how much their life would be if they did not make time for themselves to relax. The group stated ways they could improve their relaxation techniques in the future.  Individuals participated in the intervention "Time to Relax" where they had a chance to experience different relaxation techniques.  Clinical Observations/Feedback:  Patient came to group and stated she likes to read, listen to music, deep breathe and work on word searches to relax. She explained that those activities make her feel better afterward. Participant stated a reason she takes time to relax is because she needs time for herself. She explained that something  She could work on that would help her to relax is not overthinking things getting in her own head. Individual was social with peers and staff while participating in group.  Sandford Diop LRT/CTRS         Mathea Frieling 07/23/2018 11:05 AM

## 2018-07-23 NOTE — Progress Notes (Signed)
[facility] MD Progress Note  07/23/2018 4:39 PM [name]  MRN:  [id number] Subjective: Follow-up for this patient with bipolar disorder with manic features.  Patient seen chart reviewed.  Patient tells me she is feeling significantly better today.  Everything about her looks like she is feeling better.  She is calm and interacting appropriately.  She is neatly groomed.  She is not having mood swings or bizarre behavior.  Insight appears to be improved.  No medicine side effects noted. Principal Problem: Bipolar 1 disorder, manic, moderate (HCC) Diagnosis: Principal Problem:   Bipolar 1 disorder, manic, moderate (HCC) Active Problems:   Essential hypertension   Hypothyroidism   Diabetes (HCC)   Bipolar 1 disorder, mixed, severe (HCC)  Total Time spent with patient: 30 minutes  Past Psychiatric History: Long history of bipolar disorder with both manic and depressed features  Past Medical History:  Past Medical History:  Diagnosis Date  . Arthritis   . Cancer [facility]) 2004   right breast ca  . Diabetes mellitus without complication ([place])   . Hypercholesterolemia   . Hypertension   . Hypothyroid   . Seasonal allergies     Past Surgical History:  Procedure Laterality Date  . ABDOMINAL HYSTERECTOMY    . BREAST BIOPSY Left    neg  . BREAST EXCISIONAL BIOPSY Right 2004   breast ca and lumpectomy  . BREAST LUMPECTOMY Right 2004   breast ca  . CHOLECYSTECTOMY    . COLONOSCOPY WITH PROPOFOL N/A 02/13/2017   Procedure: COLONOSCOPY WITH PROPOFOL;  Surgeon: Jonathon Bellows, MD;  Location: [facility] ENDOSCOPY;  Service: Gastroenterology;  Laterality: N/A;  . KIDNEY STONE SURGERY     Family History:  Family History  Problem Relation Age of Onset  . Bladder Cancer Neg Hx   . Kidney cancer Neg Hx    Family Psychiatric  History: See previous Social History:  Social History   Substance and Sexual Activity  Alcohol Use No     Social History   Substance and Sexual Activity  Drug Use No     Social History   Socioeconomic History  . Marital status: Divorced    Spouse name: Not on file  . Number of children: Not on file  . Years of education: Not on file  . Highest education level: Not on file  Occupational History  . Not on file  Social Needs  . Financial resource strain: Not on file  . Food insecurity:    Worry: Not on file    Inability: Not on file  . Transportation needs:    Medical: Not on file    Non-medical: Not on file  Tobacco Use  . Smoking status: Former Smoker    Types: Cigarettes  . Smokeless tobacco: Never Used  Substance and Sexual Activity  . Alcohol use: No  . Drug use: No  . Sexual activity: Not Currently  Lifestyle  . Physical activity:    Days per week: Not on file    Minutes per session: Not on file  . Stress: Not on file  Relationships  . Social connections:    Talks on phone: Not on file    Gets together: Not on file    Attends religious service: Not on file    Active member of club or organization: Not on file    Attends meetings of clubs or organizations: Not on file    Relationship status: Not on file  Other Topics Concern  . Not on file  Social  History Narrative  . Not on file   Additional Social History:                         Sleep: Fair  Appetite:  Fair  Current Medications: Current Facility-Administered Medications  Medication Dose Route Frequency Provider Last Rate Last Dose  . acetaminophen (TYLENOL) tablet 650 mg  650 mg Oral Q6H PRN Lavella Hammock, MD   650 mg at 07/22/18 1308  . alum & mag hydroxide-simeth (MAALOX/MYLANTA) 200-200-20 MG/5ML suspension 30 mL  30 mL Oral Q4H PRN Lavella Hammock, MD      . clonazePAM Bobbye Charleston) tablet 1 mg  1 mg Oral QHS Clapacs, Madie Reno, MD   1 mg at 07/22/18 2145  . divalproex (DEPAKOTE ER) 24 hr tablet 1,000 mg  1,000 mg Oral QHS Lavella Hammock, MD   1,000 mg at 07/22/18 2145  . enalapril (VASOTEC) tablet 20 mg  20 mg Oral BID Lavella Hammock, MD   20 mg at  07/23/18 1628  . FLUoxetine (PROZAC) capsule 40 mg  40 mg Oral Daily Lavella Hammock, MD   40 mg at 07/23/18 6967  . glipiZIDE (GLUCOTROL XL) 24 hr tablet 10 mg  10 mg Oral BID Clapacs, John T, MD   10 mg at 07/23/18 1634  . hydrOXYzine (ATARAX/VISTARIL) tablet 25 mg  25 mg Oral Q6H PRN Lavella Hammock, MD   25 mg at 07/22/18 2145  . insulin aspart (novoLOG) injection 0-15 Units  0-15 Units Subcutaneous TID WC Lavella Hammock, MD   2 Units at 07/23/18 1213  . levothyroxine (SYNTHROID, LEVOTHROID) tablet 75 mcg  75 mcg Oral Doroteo Bradford, MD   75 mcg at 07/23/18 [phone]  . loratadine (CLARITIN) tablet 10 mg  10 mg Oral Daily Lavella Hammock, MD   10 mg at 07/23/18 1017  . magnesium hydroxide (MILK OF MAGNESIA) suspension 30 mL  30 mL Oral Daily PRN Lavella Hammock, MD      . olopatadine (PATANOL) 0.1 % ophthalmic solution 1 drop  1 drop Both Eyes BID Lavella Hammock, MD   1 drop at 07/23/18 1635  . simvastatin (ZOCOR) tablet 20 mg  20 mg Oral Daily Lavella Hammock, MD   20 mg at 07/23/18 5102  . traZODone (DESYREL) tablet 100 mg  100 mg Oral QHS Pucilowska, Jolanta B, MD   100 mg at 07/23/18 0000    Lab Results:  Results for orders placed or performed during the hospital encounter of 07/21/18 (from the past 48 hour(s))  Glucose, capillary     Status: Abnormal   Collection Time: 07/22/18  7:03 AM  Result Value Ref Range   Glucose-Capillary 163 (H) 70 - 99 mg/dL   Comment 1 Notify RN   Glucose, capillary     Status: Abnormal   Collection Time: 07/22/18 11:45 AM  Result Value Ref Range   Glucose-Capillary 130 (H) 70 - 99 mg/dL   Comment 1 Notify RN   Glucose, capillary     Status: Abnormal   Collection Time: 07/22/18  5:03 PM  Result Value Ref Range   Glucose-Capillary 175 (H) 70 - 99 mg/dL   Comment 1 Notify RN   Glucose, capillary     Status: Abnormal   Collection Time: 07/22/18  8:41 PM  Result Value Ref Range   Glucose-Capillary 146 (H) 70 - 99 mg/dL  Glucose,  capillary     Status: Abnormal  Collection Time: 07/23/18  7:04 AM  Result Value Ref Range   Glucose-Capillary 178 (H) 70 - 99 mg/dL   Comment 1 Notify RN   Glucose, capillary     Status: Abnormal   Collection Time: 07/23/18 12:09 PM  Result Value Ref Range   Glucose-Capillary 159 (H) 70 - 99 mg/dL  Glucose, capillary     Status: None   Collection Time: 07/23/18  4:26 PM  Result Value Ref Range   Glucose-Capillary 81 70 - 99 mg/dL    Blood Alcohol level:  Lab Results  Component Value Date   ETH <10 07/20/2018   ETH <10 31/54/0086    Metabolic Disorder Labs: Lab Results  Component Value Date   HGBA1C 9.6 (H) 07/21/2018   MPG 228.82 07/21/2018   MPG 240 07/07/2018   No results found for: PROLACTIN Lab Results  Component Value Date   CHOL 167 07/21/2018   TRIG 221 (H) 07/21/2018   HDL 48 07/21/2018   CHOLHDL 3.5 07/21/2018   VLDL 44 (H) 07/21/2018   LDLCALC 75 07/21/2018   LDLCALC 93 07/07/2018    Physical Findings: AIMS:  , ,  ,  ,    CIWA:  CIWA-Ar Total: 0 COWS:     Musculoskeletal: Strength & Muscle Tone: within normal limits Gait & Station: normal Patient leans: N/A  Psychiatric Specialty Exam: Physical Exam  Nursing note and vitals reviewed. Constitutional: She appears well-developed and well-nourished.  HENT:  Head: Normocephalic and atraumatic.  Eyes: Pupils are equal, round, and reactive to light. Conjunctivae are normal.  Neck: Normal range of motion.  Cardiovascular: Regular rhythm and normal heart sounds.  Respiratory: Effort normal. No respiratory distress.  GI: Soft.  Musculoskeletal: Normal range of motion.  Neurological: She is alert.  Skin: Skin is warm and dry.  Psychiatric: Her speech is normal. Judgment and thought content normal. Her affect is labile. She is agitated. She is not aggressive. She exhibits abnormal recent memory.    Review of Systems  Constitutional: Negative.   HENT: Negative.   Eyes: Negative.   Respiratory:  Negative.   Cardiovascular: Negative.   Gastrointestinal: Negative.   Musculoskeletal: Negative.   Skin: Negative.   Neurological: Negative.   Psychiatric/Behavioral: Negative.     Blood pressure (!) 168/82, pulse 75, temperature (!) 97.5 F (36.4 C), temperature source Oral, resp. rate 18, height 5' (1.524 m), weight 72.6 kg, SpO2 98 %.Body mass index is 31.25 kg/m.  General Appearance: Casual  Eye Contact:  Good  Speech:  Normal Rate  Volume:  Normal  Mood:  Euthymic  Affect:  Congruent  Thought Process:  Goal Directed  Orientation:  Full (Time, Place, and Person)  Thought Content:  Logical  Suicidal Thoughts:  No  Homicidal Thoughts:  No  Memory:  Immediate;   Fair Recent;   Fair Remote;   Fair  Judgement:  Fair  Insight:  Fair  Psychomotor Activity:  Normal  Concentration:  Concentration: Fair  Recall:  [employer] of Knowledge:  Fair  Language:  Fair  Akathisia:  No  Handed:  Right  AIMS (if indicated):     Assets:  Desire for Improvement Housing Physical Health  ADL's:  Intact  Cognition:  Impaired,  Mild  Sleep:  Number of Hours: 7.75     Treatment Plan Summary: Daily contact with patient to assess and evaluate symptoms and progress in treatment, Medication management and Plan Patient is doing better.  Tolerating medicine including mood stabilizer.  Still a little  bit euphoric.  Still at some risk for urgent relapse necessitating longer time in the hospital.  No change to medicine today.  Supportive and educational counseling.  Likely length of stay still 2 to 3 days  Alethia Berthold, MD 07/23/2018, 4:39 PM

## 2018-07-23 NOTE — Plan of Care (Signed)
Patient is alert and oriented, denies SI, HI and AVH. Patient is pleasant and cooperative on the unit; takes medications appropriately. Patient does complain about feeling tired in the afternoon after lunch, and wanting to take naps. Patient states the last time I was here I didn't feel this tired, I think its getting harder for me to even get up out of bed and walk after lunch.  Patient has been attending groups and participating. Patient has no self harming behaviors observed. Problem: Education: Goal: Knowledge of [place] [facility] Education information/materials will improve Outcome: Progressing   Problem: Coping: Goal: Coping ability will improve Outcome: Progressing Goal: Will verbalize feelings Outcome: Progressing   Problem: Safety: Goal: Ability to disclose and discuss suicidal ideas will improve Outcome: Progressing   Problem: Self-Concept: Goal: Ability to identify factors that promote anxiety will improve Outcome: Progressing Goal: Level of anxiety will decrease Outcome: Progressing Goal: Ability to modify response to factors that promote anxiety will improve Outcome: Progressing

## 2018-07-23 NOTE — Plan of Care (Signed)
Patient presents with anxious mood requesting medication for her anxiety. See MAR  Problem: Self-Concept: Goal: Level of anxiety will decrease Outcome: Not Progressing

## 2018-07-23 NOTE — B[facility] Group Notes (Signed)
LCSW Group Therapy Note  07/23/2018 1:00 PM  Type of Therapy/Topic:  Group Therapy:  Balance in Life  Participation Level:  Active  Description of Group:    This group will address the concept of balance and how it feels and looks when one is unbalanced. Patients will be encouraged to process areas in their lives that are out of balance and identify reasons for remaining unbalanced. Facilitators will guide patients in utilizing problem-solving interventions to address and correct the stressor making their life unbalanced. Understanding and applying boundaries will be explored and addressed for obtaining and maintaining a balanced life. Patients will be encouraged to explore ways to assertively make their unbalanced needs known to significant others in their lives, using other group members and facilitator for support and feedback.  Therapeutic Goals: 1. Patient will identify two or more emotions or situations they have that consume much of in their lives. 2. Patient will identify signs/triggers that life has become out of balance:  3. Patient will identify two ways to set boundaries in order to achieve balance in their lives:  4. Patient will demonstrate ability to communicate their needs through discussion and/or role plays  Summary of Patient Progress: Patient was attentive and active in group.  Patient engaged in group disucssion on how being imbalanced has made her feel.  Patient described it as "being all scrambled". Patient engaged in discussion about feeling that she was imbalanced in her isolation. Patient engaged in discussion on boundaries, SMART goals and assertive communication skills.    Therapeutic Modalities:   Cognitive Behavioral Therapy Solution-Focused Therapy Assertiveness Training  Assunta Curtis MSW, LCSW 07/23/2018 2:20 PM

## 2018-07-24 LAB — GLUCOSE, CAPILLARY
Glucose-Capillary: 165 mg/dL — ABNORMAL HIGH (ref 70–99)
Glucose-Capillary: 135 mg/dL — ABNORMAL HIGH (ref 70–99)
Glucose-Capillary: 230 mg/dL — ABNORMAL HIGH (ref 70–99)
Glucose-Capillary: 107 mg/dL — ABNORMAL HIGH (ref 70–99)

## 2018-07-24 LAB — VALPROIC ACID LEVEL: Valproic Acid Lvl: 87 ug/mL (ref 50.0–100.0)

## 2018-07-24 NOTE — Progress Notes (Addendum)
[facility] MD Progress Note  07/25/2018 2:04 PM [name]  MRN:  [id number]  Subjective:    [name] feels very depressed since yesterday and has been very tearful during our conversation. She feels stiff and achy but refuses any medications including a gel for her hurting knee. She seems mighty confused. I know her from previous admissions and she has always been sharp and very independent in her thinking. She now complains that she no longer knows what to buy or even how to buy her food. She used to take excellent care of her diabetes and lost tremendous amout of weight over the years. She has been very much impressed with her recent hospitalization at [facility], "they help me a lot" but apparently, she started coming back to our ER just following discharge. She is on antidepressant here.  Principal Problem: Bipolar 1 disorder, manic, moderate (HCC) Diagnosis: Principal Problem:   Bipolar 1 disorder, manic, moderate (HCC) Active Problems:   Essential hypertension   Hypothyroidism   Diabetes (HCC)   Bipolar 1 disorder, mixed, severe (HCC)  Total Time spent with patient: 20 minutes  Past Psychiatric History: bipolar disorder  Past Medical History:  Past Medical History:  Diagnosis Date  . Arthritis   . Cancer [facility]) 2004   right breast ca  . Diabetes mellitus without complication ([place])   . Hypercholesterolemia   . Hypertension   . Hypothyroid   . Seasonal allergies     Past Surgical History:  Procedure Laterality Date  . ABDOMINAL HYSTERECTOMY    . BREAST BIOPSY Left    neg  . BREAST EXCISIONAL BIOPSY Right 2004   breast ca and lumpectomy  . BREAST LUMPECTOMY Right 2004   breast ca  . CHOLECYSTECTOMY    . COLONOSCOPY WITH PROPOFOL N/A 02/13/2017   Procedure: COLONOSCOPY WITH PROPOFOL;  Surgeon: Jonathon Bellows, MD;  Location: [facility] ENDOSCOPY;  Service: Gastroenterology;  Laterality: N/A;  . KIDNEY STONE SURGERY     Family History:  Family History  Problem  Relation Age of Onset  . Bladder Cancer Neg Hx   . Kidney cancer Neg Hx    Family Psychiatric  History: see H&P Social History:  Social History   Substance and Sexual Activity  Alcohol Use No     Social History   Substance and Sexual Activity  Drug Use No    Social History   Socioeconomic History  . Marital status: Divorced    Spouse name: Not on file  . Number of children: Not on file  . Years of education: Not on file  . Highest education level: Not on file  Occupational History  . Not on file  Social Needs  . Financial resource strain: Not on file  . Food insecurity:    Worry: Not on file    Inability: Not on file  . Transportation needs:    Medical: Not on file    Non-medical: Not on file  Tobacco Use  . Smoking status: Former Smoker    Types: Cigarettes  . Smokeless tobacco: Never Used  Substance and Sexual Activity  . Alcohol use: No  . Drug use: No  . Sexual activity: Not Currently  Lifestyle  . Physical activity:    Days per week: Not on file    Minutes per session: Not on file  . Stress: Not on file  Relationships  . Social connections:    Talks on phone: Not on file    Gets together: Not on file  Attends religious service: Not on file    Active member of club or organization: Not on file    Attends meetings of clubs or organizations: Not on file    Relationship status: Not on file  Other Topics Concern  . Not on file  Social History Narrative  . Not on file   Additional Social History:                         Sleep: Fair  Appetite:  Fair  Current Medications: Current Facility-Administered Medications  Medication Dose Route Frequency Provider Last Rate Last Dose  . acetaminophen (TYLENOL) tablet 650 mg  650 mg Oral Q6H PRN Lavella Hammock, MD   650 mg at 07/25/18 1054  . alum & mag hydroxide-simeth (MAALOX/MYLANTA) 200-200-20 MG/5ML suspension 30 mL  30 mL Oral Q4H PRN Lavella Hammock, MD      . clonazePAM Bobbye Charleston)  tablet 1 mg  1 mg Oral QHS Clapacs, Madie Reno, MD   1 mg at 07/24/18 2117  . divalproex (DEPAKOTE ER) 24 hr tablet 1,000 mg  1,000 mg Oral QHS Lavella Hammock, MD   1,000 mg at 07/24/18 2118  . enalapril (VASOTEC) tablet 20 mg  20 mg Oral BID Lavella Hammock, MD   20 mg at 07/25/18 0758  . FLUoxetine (PROZAC) capsule 40 mg  40 mg Oral Daily Lavella Hammock, MD   40 mg at 07/25/18 0758  . glipiZIDE (GLUCOTROL XL) 24 hr tablet 10 mg  10 mg Oral BID Clapacs, Madie Reno, MD   10 mg at 07/25/18 0758  . hydrOXYzine (ATARAX/VISTARIL) tablet 25 mg  25 mg Oral Q6H PRN Lavella Hammock, MD   25 mg at 07/22/18 2145  . insulin aspart (novoLOG) injection 0-15 Units  0-15 Units Subcutaneous TID WC Lavella Hammock, MD   5 Units at 07/25/18 1123  . levothyroxine (SYNTHROID, LEVOTHROID) tablet 75 mcg  75 mcg Oral Doroteo Bradford, MD   75 mcg at 07/25/18 [phone]  . loratadine (CLARITIN) tablet 10 mg  10 mg Oral Daily Lavella Hammock, MD   10 mg at 07/25/18 0758  . magnesium hydroxide (MILK OF MAGNESIA) suspension 30 mL  30 mL Oral Daily PRN Lavella Hammock, MD      . olopatadine (PATANOL) 0.1 % ophthalmic solution 1 drop  1 drop Both Eyes BID Lavella Hammock, MD   1 drop at 07/25/18 0758  . simvastatin (ZOCOR) tablet 20 mg  20 mg Oral Daily Lavella Hammock, MD   20 mg at 07/25/18 0758  . traZODone (DESYREL) tablet 100 mg  100 mg Oral QHS Chianti Goh B, MD   100 mg at 07/23/18 2109    Lab Results:  Results for orders placed or performed during the hospital encounter of 07/21/18 (from the past 48 hour(s))  Glucose, capillary     Status: None   Collection Time: 07/23/18  4:26 PM  Result Value Ref Range   Glucose-Capillary 81 70 - 99 mg/dL  Glucose, capillary     Status: Abnormal   Collection Time: 07/23/18  9:06 PM  Result Value Ref Range   Glucose-Capillary 159 (H) 70 - 99 mg/dL   Comment 1 Notify RN   Glucose, capillary     Status: Abnormal   Collection Time: 07/24/18  7:15 AM  Result Value  Ref Range   Glucose-Capillary 165 (H) 70 - 99 mg/dL   Comment 1 Notify  RN   Valproic acid level     Status: None   Collection Time: 07/24/18  7:24 AM  Result Value Ref Range   Valproic Acid Lvl 87 50.0 - 100.0 ug/mL    Comment: Performed at [facility], [address] [place], [place] [address]  Glucose, capillary     Status: Abnormal   Collection Time: 07/24/18 12:04 PM  Result Value Ref Range   Glucose-Capillary 135 (H) 70 - 99 mg/dL  Glucose, capillary     Status: Abnormal   Collection Time: 07/24/18  4:16 PM  Result Value Ref Range   Glucose-Capillary 230 (H) 70 - 99 mg/dL  Glucose, capillary     Status: Abnormal   Collection Time: 07/24/18  8:54 PM  Result Value Ref Range   Glucose-Capillary 107 (H) 70 - 99 mg/dL   Comment 1 Notify RN   Glucose, capillary     Status: Abnormal   Collection Time: 07/25/18  7:02 AM  Result Value Ref Range   Glucose-Capillary 160 (H) 70 - 99 mg/dL  Glucose, capillary     Status: Abnormal   Collection Time: 07/25/18 11:17 AM  Result Value Ref Range   Glucose-Capillary 215 (H) 70 - 99 mg/dL   Comment 1 Notify RN     Blood Alcohol level:  Lab Results  Component Value Date   ETH <10 07/20/2018   ETH <10 53/61/4431    Metabolic Disorder Labs: Lab Results  Component Value Date   HGBA1C 9.6 (H) 07/21/2018   MPG 228.82 07/21/2018   MPG 240 07/07/2018   No results found for: PROLACTIN Lab Results  Component Value Date   CHOL 167 07/21/2018   TRIG 221 (H) 07/21/2018   HDL 48 07/21/2018   CHOLHDL 3.5 07/21/2018   VLDL 44 (H) 07/21/2018   LDLCALC 75 07/21/2018   LDLCALC 93 07/07/2018    Physical Findings: AIMS:  , ,  ,  ,    CIWA:  CIWA-Ar Total: 0 COWS:     Musculoskeletal: Strength & Muscle Tone: within normal limits Gait & Station: normal Patient leans: N/A  Psychiatric Specialty Exam: Physical Exam  Nursing note and vitals reviewed. Psychiatric: Her speech is normal. Judgment and thought content normal. Her  mood appears anxious. Her affect is blunt. She is withdrawn. Cognition and memory are normal. She exhibits a depressed mood.    Review of Systems  Musculoskeletal: Positive for myalgias.  Neurological: Positive for weakness.  Psychiatric/Behavioral: Positive for depression. The patient is nervous/anxious.   All other systems reviewed and are negative.   Blood pressure 136/79, pulse 71, temperature 97.7 F (36.5 C), temperature source Oral, resp. rate 18, height 5' (1.524 m), weight 72.6 kg, SpO2 96 %.Body mass index is 31.25 kg/m.  General Appearance: Disheveled  Eye Contact:  Good  Speech:  Clear and Coherent  Volume:  Normal  Mood:  Depressed, Hopeless and Worthless  Affect:  Blunt and Tearful  Thought Process:  Disorganized  Orientation:  Full (Time, Place, and Person)  Thought Content:  Obsessions  Suicidal Thoughts:  No  Homicidal Thoughts:  No  Memory:  Immediate;   Fair Recent;   Fair Remote;   Fair  Judgement:  Impaired  Insight:  Present  Psychomotor Activity:  Decreased  Concentration:  Concentration: Poor and Attention Span: Poor  Recall:  Poor  Fund of Knowledge:  Fair  Language:  Fair  Akathisia:  No  Handed:  Right  AIMS (if indicated):     Assets:  Communication Skills Desire for Improvement Financial Resources/Insurance Housing Physical Health Resilience  ADL's:  Intact  Cognition:  WNL  Sleep:  Number of Hours: 6.5     Treatment Plan Summary: Daily contact with patient to assess and evaluate symptoms and progress in treatment and Medication management   Ms. Leclaire is a  73 year old female with a history of bipolar disorder admitted in a mixed episode.  #Mood, not improving -Depakote1000 mg nightly -Clonazepam 1 mg nightly -Prozac 40 mg daily -discontinue Trazodone 100 mg PRN as patient refuses  #DM -ADA diet -Glucotrol 10 mg daily -SSI  #HTN -Vasotec 20 mg  #Dyslipidemia -Zocor 20 mg daily  #Hypothyroidism -Synthroid 75 mcg  daily  #Disposition -discharge to home -follow up with regular provider    Orson Slick, MD 07/25/2018, 2:04 PM

## 2018-07-24 NOTE — Progress Notes (Signed)
Recreation Therapy Notes   Date: 07/24/2018  Time: 9:30 am  Location: Craft Room  Behavioral response: Appropriate  Intervention Topic: Leisure  Discussion/Intervention:  Group content today was focused on leisure. The group defined what leisure is and some positive leisure activities they participate in. Individuals identified the difference between good and bad leisure. Participants expressed how they feel after participating in the leisure of their choice. The group discussed how they go about picking a leisure activity and if others are involved in their leisure activities. The patient stated how many leisure activities they too choose from and reasons why it is important to have leisure time. Individuals participated in the intervention "Leisure Jeopardy" where they had a chance to identify new leisure activities as well as benefits of leisure. Clinical Observations/Feedback:  Patient came to group later and identified puzzles, coloring and reading as leisure activities she participates in. She explained that leisure activities helps her relax and calm down. Participant stated helps achieve a peace of mind and can improve health. Individual was social with peers and staff while participating in group.  Kenta Laster LRT/CTRS          Tayler Heiden 07/24/2018 12:08 PM

## 2018-07-24 NOTE — Plan of Care (Signed)
Patient stayed in bed this evening states "I don't feel like getting up for dinner,feels so tired."Patient walked to the day room with no assistance.Patient rated her depression 5/10 and anxiety 2/10.Denies SI,HI and AVH.Patient is pleasant and cooperative on approach.Support and encouragement given.

## 2018-07-24 NOTE — Plan of Care (Signed)
D: Patient has been interacting appropriately with patients and staff, socializing in the dayroom. She did complain that the Trazodone she took last night made her too sleepy during the day and wanted to wait until 2300 to take it if she could not sleep.Denies SI, HI and AV hallucinations. Voices no complaints. States she has chronic arthritis pain 1/10 in her hands but does not want to take anything for pain unless it is worse.  A: continue to monitor for safety and offer support. R: safety maintained

## 2018-07-24 NOTE — Plan of Care (Signed)
Patient stated that she had a good day to this writer and that her anxiety feels better than it did yesterday.   Problem: Self-Concept: Goal: Level of anxiety will decrease Outcome: Progressing

## 2018-07-24 NOTE — Progress Notes (Signed)
[facility] MD Progress Note  07/24/2018 8:29 PM [name]  MRN:  [id number] Subjective: Follow-up for this patient with bipolar disorder.  Patient continues to state that mentally she is feeling much better.  Not feeling sad and not feeling agitated.  Denies psychotic symptoms.  She does complain that she is feeling fatigued today.  She slept okay at night.  No clear reason why she is more tired today.  Not having any pain or any other symptoms of medical issues.  I did note that her blood sugar was higher than normal this evening. Principal Problem: Bipolar 1 disorder, manic, moderate (HCC) Diagnosis: Principal Problem:   Bipolar 1 disorder, manic, moderate (HCC) Active Problems:   Essential hypertension   Hypothyroidism   Diabetes (HCC)   Bipolar 1 disorder, mixed, severe ([place])  Total Time spent with patient: 30 minutes  Past Psychiatric History: Patient has a past history of bipolar disorder with both manic and depressed phases.  Past Medical History:  Past Medical History:  Diagnosis Date  . Arthritis   . Cancer [facility]) 2004   right breast ca  . Diabetes mellitus without complication ([place])   . Hypercholesterolemia   . Hypertension   . Hypothyroid   . Seasonal allergies     Past Surgical History:  Procedure Laterality Date  . ABDOMINAL HYSTERECTOMY    . BREAST BIOPSY Left    neg  . BREAST EXCISIONAL BIOPSY Right 2004   breast ca and lumpectomy  . BREAST LUMPECTOMY Right 2004   breast ca  . CHOLECYSTECTOMY    . COLONOSCOPY WITH PROPOFOL N/A 02/13/2017   Procedure: COLONOSCOPY WITH PROPOFOL;  Surgeon: Jonathon Bellows, MD;  Location: [facility] ENDOSCOPY;  Service: Gastroenterology;  Laterality: N/A;  . KIDNEY STONE SURGERY     Family History:  Family History  Problem Relation Age of Onset  . Bladder Cancer Neg Hx   . Kidney cancer Neg Hx    Family Psychiatric  History: See previous Social History:  Social History   Substance and Sexual Activity  Alcohol Use No      Social History   Substance and Sexual Activity  Drug Use No    Social History   Socioeconomic History  . Marital status: Divorced    Spouse name: Not on file  . Number of children: Not on file  . Years of education: Not on file  . Highest education level: Not on file  Occupational History  . Not on file  Social Needs  . Financial resource strain: Not on file  . Food insecurity:    Worry: Not on file    Inability: Not on file  . Transportation needs:    Medical: Not on file    Non-medical: Not on file  Tobacco Use  . Smoking status: Former Smoker    Types: Cigarettes  . Smokeless tobacco: Never Used  Substance and Sexual Activity  . Alcohol use: No  . Drug use: No  . Sexual activity: Not Currently  Lifestyle  . Physical activity:    Days per week: Not on file    Minutes per session: Not on file  . Stress: Not on file  Relationships  . Social connections:    Talks on phone: Not on file    Gets together: Not on file    Attends religious service: Not on file    Active member of club or organization: Not on file    Attends meetings of clubs or organizations: Not on file  Relationship status: Not on file  Other Topics Concern  . Not on file  Social History Narrative  . Not on file   Additional Social History:                         Sleep: Fair  Appetite:  Fair  Current Medications: Current Facility-Administered Medications  Medication Dose Route Frequency Provider Last Rate Last Dose  . acetaminophen (TYLENOL) tablet 650 mg  650 mg Oral Q6H PRN Lavella Hammock, MD   650 mg at 07/22/18 1308  . alum & mag hydroxide-simeth (MAALOX/MYLANTA) 200-200-20 MG/5ML suspension 30 mL  30 mL Oral Q4H PRN Lavella Hammock, MD      . clonazePAM Bobbye Charleston) tablet 1 mg  1 mg Oral QHS Carloyn Lahue, Madie Reno, MD   1 mg at 07/23/18 2109  . divalproex (DEPAKOTE ER) 24 hr tablet 1,000 mg  1,000 mg Oral QHS Lavella Hammock, MD   1,000 mg at 07/23/18 2109  . enalapril  (VASOTEC) tablet 20 mg  20 mg Oral BID Lavella Hammock, MD   20 mg at 07/24/18 1625  . FLUoxetine (PROZAC) capsule 40 mg  40 mg Oral Daily Lavella Hammock, MD   40 mg at 07/24/18 0800  . glipiZIDE (GLUCOTROL XL) 24 hr tablet 10 mg  10 mg Oral BID Satina Jerrell T, MD   10 mg at 07/24/18 1625  . hydrOXYzine (ATARAX/VISTARIL) tablet 25 mg  25 mg Oral Q6H PRN Lavella Hammock, MD   25 mg at 07/22/18 2145  . insulin aspart (novoLOG) injection 0-15 Units  0-15 Units Subcutaneous TID WC Lavella Hammock, MD   5 Units at 07/24/18 1624  . levothyroxine (SYNTHROID, LEVOTHROID) tablet 75 mcg  75 mcg Oral Doroteo Bradford, MD   75 mcg at 07/24/18 4665  . loratadine (CLARITIN) tablet 10 mg  10 mg Oral Daily Lavella Hammock, MD   10 mg at 07/24/18 0800  . magnesium hydroxide (MILK OF MAGNESIA) suspension 30 mL  30 mL Oral Daily PRN Lavella Hammock, MD      . olopatadine (PATANOL) 0.1 % ophthalmic solution 1 drop  1 drop Both Eyes BID Lavella Hammock, MD   1 drop at 07/24/18 1625  . simvastatin (ZOCOR) tablet 20 mg  20 mg Oral Daily Lavella Hammock, MD   20 mg at 07/24/18 0759  . traZODone (DESYREL) tablet 100 mg  100 mg Oral QHS Pucilowska, Jolanta B, MD   100 mg at 07/23/18 2109    Lab Results:  Results for orders placed or performed during the hospital encounter of 07/21/18 (from the past 48 hour(s))  Glucose, capillary     Status: Abnormal   Collection Time: 07/22/18  8:41 PM  Result Value Ref Range   Glucose-Capillary 146 (H) 70 - 99 mg/dL  Glucose, capillary     Status: Abnormal   Collection Time: 07/23/18  7:04 AM  Result Value Ref Range   Glucose-Capillary 178 (H) 70 - 99 mg/dL   Comment 1 Notify RN   Glucose, capillary     Status: Abnormal   Collection Time: 07/23/18 12:09 PM  Result Value Ref Range   Glucose-Capillary 159 (H) 70 - 99 mg/dL  Glucose, capillary     Status: None   Collection Time: 07/23/18  4:26 PM  Result Value Ref Range   Glucose-Capillary 81 70 - 99 mg/dL   Glucose, capillary     Status:  Abnormal   Collection Time: 07/23/18  9:06 PM  Result Value Ref Range   Glucose-Capillary 159 (H) 70 - 99 mg/dL   Comment 1 Notify RN   Glucose, capillary     Status: Abnormal   Collection Time: 07/24/18  7:15 AM  Result Value Ref Range   Glucose-Capillary 165 (H) 70 - 99 mg/dL   Comment 1 Notify RN   Valproic acid level     Status: None   Collection Time: 07/24/18  7:24 AM  Result Value Ref Range   Valproic Acid Lvl 87 50.0 - 100.0 ug/mL    Comment: Performed at [facility], [address] [place], [place] [address]  Glucose, capillary     Status: Abnormal   Collection Time: 07/24/18 12:04 PM  Result Value Ref Range   Glucose-Capillary 135 (H) 70 - 99 mg/dL  Glucose, capillary     Status: Abnormal   Collection Time: 07/24/18  4:16 PM  Result Value Ref Range   Glucose-Capillary 230 (H) 70 - 99 mg/dL    Blood Alcohol level:  Lab Results  Component Value Date   ETH <10 07/20/2018   ETH <10 09/73/5329    Metabolic Disorder Labs: Lab Results  Component Value Date   HGBA1C 9.6 (H) 07/21/2018   MPG 228.82 07/21/2018   MPG 240 07/07/2018   No results found for: PROLACTIN Lab Results  Component Value Date   CHOL 167 07/21/2018   TRIG 221 (H) 07/21/2018   HDL 48 07/21/2018   CHOLHDL 3.5 07/21/2018   VLDL 44 (H) 07/21/2018   LDLCALC 75 07/21/2018   LDLCALC 93 07/07/2018    Physical Findings: AIMS:  , ,  ,  ,    CIWA:  CIWA-Ar Total: 0 COWS:     Musculoskeletal: Strength & Muscle Tone: within normal limits Gait & Station: normal Patient leans: N/A  Psychiatric Specialty Exam: Physical Exam  Nursing note and vitals reviewed. Constitutional: She appears well-developed and well-nourished.  HENT:  Head: Normocephalic and atraumatic.  Eyes: Pupils are equal, round, and reactive to light. Conjunctivae are normal.  Neck: Normal range of motion.  Cardiovascular: Regular rhythm and normal heart sounds.  Respiratory: Effort  normal. No respiratory distress.  GI: Soft.  Musculoskeletal: Normal range of motion.  Neurological: She is alert.  Skin: Skin is warm and dry.  Psychiatric: She has a normal mood and affect. Her speech is normal and behavior is normal. Judgment and thought content normal. Cognition and memory are normal.    Review of Systems  Constitutional: Negative.   HENT: Negative.   Eyes: Negative.   Respiratory: Negative.   Cardiovascular: Negative.   Gastrointestinal: Negative.   Musculoskeletal: Negative.   Skin: Negative.   Neurological: Negative.   Psychiatric/Behavioral: Negative.  Negative for depression.    Blood pressure (!) 146/76, pulse 68, temperature 98.2 F (36.8 C), temperature source Oral, resp. rate 18, height 5' (1.524 m), weight 72.6 kg, SpO2 97 %.Body mass index is 31.25 kg/m.  General Appearance: Casual  Eye Contact:  Fair  Speech:  Normal Rate  Volume:  Normal  Mood:  Euthymic  Affect:  Constricted  Thought Process:  Goal Directed  Orientation:  Full (Time, Place, and Person)  Thought Content:  Logical  Suicidal Thoughts:  No  Homicidal Thoughts:  No  Memory:  Immediate;   Fair Recent;   Fair Remote;   Fair  Judgement:  Fair  Insight:  Fair  Psychomotor Activity:  Normal  Concentration:  Concentration: Fair  Recall:  Smiley Houseman of Knowledge:  Fair  Language:  Fair  Akathisia:  No  Handed:  Right  AIMS (if indicated):     Assets:  Desire for Improvement Housing Physical Health  ADL's:  Intact  Cognition:  WNL  Sleep:  Number of Hours: 7.75     Treatment Plan Summary: Daily contact with patient to assess and evaluate symptoms and progress in treatment, Medication management and Plan No change to psychiatric medicine.  All of her sedating medicines appear to be given at nighttime anyway so I do not see that that is causing her any fatigue.  Currently continue her monitoring of blood sugar.  Possible discharge at the end of the weekend if she is still  doing well.  Alethia Berthold, MD 07/24/2018, 8:29 PM

## 2018-07-24 NOTE — Progress Notes (Signed)
D - Patient was in her room upon arrival to the unit. Patient was pleasant during assessment and medication administration. Patient denies SI/HI/AVH and pain. Patient rated her anxiety 5/10. See MAR. Patient stated to this writer, "I had a good day and my anxiety feels better than it did yesterday." Patient was observed interacting appropriately with staff and peers on the unit.   A - Patient compliant with medication administration per MD orders. Patient given education. Patient given support and encouragement to be active in her treatment plan. Patient informed to let staff know if there are any issues or problems on the unit.   R - Patient being monitored Q 15 minutes for safety per unit protocol. Patient remains safe on the unit.

## 2018-07-24 NOTE — Progress Notes (Signed)
D: Patient is pleasant and cooperative, socializing in the dayroom with her peers. Denies SI, HI and AV hallucinations. States that she has mild chronic pain from arthritis in her hands but denies needing pain medication at this time. States she only takes Tylenol when the pain is bad. HS CBG is 104. Patient receives no HS insulin sliding scale.  A: Continue to monitor and offer support. R: Safety maintained.

## 2018-07-25 LAB — GLUCOSE, CAPILLARY
Glucose-Capillary: 160 mg/dL — ABNORMAL HIGH (ref 70–99)
Glucose-Capillary: 215 mg/dL — ABNORMAL HIGH (ref 70–99)
Glucose-Capillary: 77 mg/dL (ref 70–99)
Glucose-Capillary: 153 mg/dL — ABNORMAL HIGH (ref 70–99)

## 2018-07-25 NOTE — Plan of Care (Signed)
Patient is alert and oriented, ambulates the unit with a slow steady gait. Reports that she slept good last night without the use of an sleep air. Appetite is fair with low energy level with good concentration. Rates her depression a 1 and her hopelessness a (15) per patient. Anxiety is a 2. Reports that her goal for today is to attend groups, take meds and feel better. Milieu remains safe with q 15 minute safety checks. Will continue to monitor.

## 2018-07-25 NOTE — [facility] Group Notes (Signed)
LCSW Group Therapy Note  07/25/2018 1:15pm  Type of Therapy and Topic:  Group Therapy:  Cognitive Distortions  Participation Level:  Did Not Attend   Description of Group:    Patients in this group will be introduced to the topic of cognitive distortions.  Patients will identify and describe cognitive distortions, describe the feelings these distortions create for them.  Patients will identify one or more situations in their personal life where they have cognitively distorted thinking and will verbalize challenging this cognitive distortion through positive thinking skills.  Patients will practice the skill of using positive affirmations to challenge cognitive distortions using affirmation cards.    Therapeutic Goals:  1. Patient will identify two or more cognitive distortions they have used 2. Patient will identify one or more emotions that stem from use of a cognitive distortion 3. Patient will demonstrate use of a positive affirmation to counter a cognitive distortion through discussion and/or role play. 4. Patient will describe one way cognitive distortions can be detrimental to wellness   Summary of Patient Progress:      Therapeutic Modalities:   Cognitive Mitchell, LCSW 07/25/2018 12:02 PM

## 2018-07-25 NOTE — Plan of Care (Signed)
Mostly in room but went to the dayroom for snack. Pleasant and cooperative. Denying thoughts of self harm. Denying hallucinations. No sign of distress. Staff continue to provide support and encouragements. Safety monitored as ordered.

## 2018-07-26 LAB — GLUCOSE, CAPILLARY
Glucose-Capillary: 163 mg/dL — ABNORMAL HIGH (ref 70–99)
Glucose-Capillary: 149 mg/dL — ABNORMAL HIGH (ref 70–99)
Glucose-Capillary: 93 mg/dL (ref 70–99)
Glucose-Capillary: 135 mg/dL — ABNORMAL HIGH (ref 70–99)

## 2018-07-26 MED ORDER — WHITE PETROLATUM EX OINT
TOPICAL_OINTMENT | CUTANEOUS | Status: DC | PRN
  Administered 2018-07-26: 14:00:00 via TOPICAL

## 2018-07-26 NOTE — [facility] Group Notes (Signed)
LCSW Group Therapy Note 07/26/2018 1:15pm  Type of Therapy and Topic: Group Therapy: Feelings Around Returning Home & Establishing a Supportive Framework and Supporting Oneself When Supports Not Available  Participation Level: Active  Description of Group:  Patients first processed thoughts and feelings about upcoming discharge. These included fears of upcoming changes, lack of change, new living environments, judgements and expectations from others and overall stigma of mental health issues. The group then discussed the definition of a supportive framework, what that looks and feels like, and how do to discern it from an unhealthy non-supportive network. The group identified different types of supports as well as what to do when your family/friends are less than helpful or unavailable  Therapeutic Goals  1. Patient will identify one healthy supportive network that they can use at discharge. 2. Patient will identify one factor of a supportive framework and how to tell it from an unhealthy network. 3. Patient able to identify one coping skill to use when they do not have positive supports from others. 4. Patient will demonstrate ability to communicate their needs through discussion and/or role plays.  Summary of Patient Progress:  The patient reported she feels "better today." Pt engaged during group session. As patients processed their anxiety about discharge and described healthy supports patient shared she is not ready to be discharge. She stated, "I cannot stay awake." Patients identified at least one self-care tool they were willing to use after discharge.   Therapeutic Modalities Cognitive Behavioral Therapy Motivational Interviewing   Thalia Turkington  CUEBAS-COLON, LCSW 07/26/2018 9:41 AM

## 2018-07-26 NOTE — Plan of Care (Signed)
Data: Patient is appropriate and cooperative to assessment. Patient denies SI/HI/AVH. Patient has completed daily self inventory worksheet. Patient reports mild Patient has complaints of the floor being too hard and a pain rating of 3/10 bilateral feet. Patient reports good sleep quality, appetite is good for the last 24 hours. Patient rates depression "3/10" , feelings of hopelessness "5/10" and anxiety "2/10" Patients goal for today is "waking up and being alert."  Action:  Q x 15 minute observation checks were completed for safety. Patient was provided with education on medications. Patient was offered support and encouragement. Patient was given scheduled medications. Patient  was encourage to attend groups, participate in unit activities and continue with plan of care.    Response: Patient is adherent with scheduled medication. Patient has no complaints at this time. Patient is receptive to treatment and safety maintained on unit.    Problem: Education: Goal: Knowledge of [facility] Education information/materials will improve Outcome: Progressing   Problem: Safety: Goal: Ability to disclose and discuss suicidal ideas will improve Outcome: Progressing

## 2018-07-26 NOTE — Plan of Care (Addendum)
DAR NOTE: Pt present with bright affect and pleasant mood in the unit. Pt has been observed in the dayroom this evening interacting with peers well. Pt stated she has been careful about what she eats and her blood sugar is been well controlled for year. Pt complained of left shoulder pain 4/10.  took all her meds as scheduled. Pt's safety ensured with 15 minute and environmental checks. Pt currently denies SI/HI and A/V hallucinations. Pt verbally agrees to seek staff if SI/HI or A/VH occurs and to consult with staff before acting on these thoughts. Will continue POC.  Problem: Education: Goal: Knowledge of [facility] Education information/materials will improve Outcome: Progressing   Problem: Coping: Goal: Coping ability will improve Outcome: Progressing   Problem: Safety: Goal: Ability to disclose and discuss suicidal ideas will improve Outcome: Progressing

## 2018-07-26 NOTE — Progress Notes (Signed)
[facility] MD Progress Note  07/26/2018 1:31 PM Zakari Kelin Nixon  MRN:  [id number]  Subjective:    Ms. Welden still in a mixed episode. Disorganized in her thinking, claiming memory loss, very anxious. She does not feel ready to resume independent living given her disorganized thoughts. This is a remarkable change as sually the patient is an independent thinker and rather corky. Her confidence is gone.  Still complains of stiffness and right knee pain for which she reluctantly took Tylenol last night..   Principal Problem: Bipolar 1 disorder, manic, moderate (HCC) Diagnosis: Principal Problem:   Bipolar 1 disorder, manic, moderate (HCC) Active Problems:   Essential hypertension   Hypothyroidism   Diabetes (HCC)   Bipolar 1 disorder, mixed, severe (HCC)  Total Time spent with patient: 20 minutes  Past Psychiatric History: bipolar disorder  Past Medical History:  Past Medical History:  Diagnosis Date  . Arthritis   . Cancer [facility]) 2004   right breast ca  . Diabetes mellitus without complication ([place])   . Hypercholesterolemia   . Hypertension   . Hypothyroid   . Seasonal allergies     Past Surgical History:  Procedure Laterality Date  . ABDOMINAL HYSTERECTOMY    . BREAST BIOPSY Left    neg  . BREAST EXCISIONAL BIOPSY Right 2004   breast ca and lumpectomy  . BREAST LUMPECTOMY Right 2004   breast ca  . CHOLECYSTECTOMY    . COLONOSCOPY WITH PROPOFOL N/A 02/13/2017   Procedure: COLONOSCOPY WITH PROPOFOL;  Surgeon: Jonathon Bellows, MD;  Location: [facility] ENDOSCOPY;  Service: Gastroenterology;  Laterality: N/A;  . KIDNEY STONE SURGERY     Family History:  Family History  Problem Relation Age of Onset  . Bladder Cancer Neg Hx   . Kidney cancer Neg Hx    Family Psychiatric  History: see H&P Social History:  Social History   Substance and Sexual Activity  Alcohol Use No     Social History   Substance and Sexual Activity  Drug Use No    Social History   Socioeconomic  History  . Marital status: Divorced    Spouse name: Not on file  . Number of children: Not on file  . Years of education: Not on file  . Highest education level: Not on file  Occupational History  . Not on file  Social Needs  . Financial resource strain: Not on file  . Food insecurity:    Worry: Not on file    Inability: Not on file  . Transportation needs:    Medical: Not on file    Non-medical: Not on file  Tobacco Use  . Smoking status: Former Smoker    Types: Cigarettes  . Smokeless tobacco: Never Used  Substance and Sexual Activity  . Alcohol use: No  . Drug use: No  . Sexual activity: Not Currently  Lifestyle  . Physical activity:    Days per week: Not on file    Minutes per session: Not on file  . Stress: Not on file  Relationships  . Social connections:    Talks on phone: Not on file    Gets together: Not on file    Attends religious service: Not on file    Active member of club or organization: Not on file    Attends meetings of clubs or organizations: Not on file    Relationship status: Not on file  Other Topics Concern  . Not on file  Social History Narrative  . Not on  file   Additional Social History:                         Sleep: Fair  Appetite:  Fair  Current Medications: Current Facility-Administered Medications  Medication Dose Route Frequency Provider Last Rate Last Dose  . acetaminophen (TYLENOL) tablet 650 mg  650 mg Oral Q6H PRN Lavella Hammock, MD   650 mg at 07/25/18 2152  . alum & mag hydroxide-simeth (MAALOX/MYLANTA) 200-200-20 MG/5ML suspension 30 mL  30 mL Oral Q4H PRN Lavella Hammock, MD      . clonazePAM Bobbye Charleston) tablet 1 mg  1 mg Oral QHS Clapacs, Madie Reno, MD   1 mg at 07/25/18 2152  . divalproex (DEPAKOTE ER) 24 hr tablet 1,000 mg  1,000 mg Oral QHS Lavella Hammock, MD   1,000 mg at 07/25/18 2152  . enalapril (VASOTEC) tablet 20 mg  20 mg Oral BID Lavella Hammock, MD   20 mg at 07/26/18 0834  . FLUoxetine (PROZAC)  capsule 40 mg  40 mg Oral Daily Lavella Hammock, MD   40 mg at 07/26/18 0834  . glipiZIDE (GLUCOTROL XL) 24 hr tablet 10 mg  10 mg Oral BID Clapacs, Madie Reno, MD   10 mg at 07/26/18 0834  . hydrOXYzine (ATARAX/VISTARIL) tablet 25 mg  25 mg Oral Q6H PRN Lavella Hammock, MD   25 mg at 07/22/18 2145  . insulin aspart (novoLOG) injection 0-15 Units  0-15 Units Subcutaneous TID WC Lavella Hammock, MD   2 Units at 07/26/18 1124  . levothyroxine (SYNTHROID, LEVOTHROID) tablet 75 mcg  75 mcg Oral Doroteo Bradford, MD   75 mcg at 07/26/18 0350  . loratadine (CLARITIN) tablet 10 mg  10 mg Oral Daily Lavella Hammock, MD   10 mg at 07/26/18 0834  . magnesium hydroxide (MILK OF MAGNESIA) suspension 30 mL  30 mL Oral Daily PRN Lavella Hammock, MD      . olopatadine (PATANOL) 0.1 % ophthalmic solution 1 drop  1 drop Both Eyes BID Lavella Hammock, MD   1 drop at 07/26/18 [phone]  . simvastatin (ZOCOR) tablet 20 mg  20 mg Oral Daily Lavella Hammock, MD   20 mg at 07/26/18 0834  . traZODone (DESYREL) tablet 100 mg  100 mg Oral QHS Norie Latendresse B, MD   100 mg at 07/23/18 2109    Lab Results:  Results for orders placed or performed during the hospital encounter of 07/21/18 (from the past 48 hour(s))  Glucose, capillary     Status: Abnormal   Collection Time: 07/24/18  4:16 PM  Result Value Ref Range   Glucose-Capillary 230 (H) 70 - 99 mg/dL  Glucose, capillary     Status: Abnormal   Collection Time: 07/24/18  8:54 PM  Result Value Ref Range   Glucose-Capillary 107 (H) 70 - 99 mg/dL   Comment 1 Notify RN   Glucose, capillary     Status: Abnormal   Collection Time: 07/25/18  7:02 AM  Result Value Ref Range   Glucose-Capillary 160 (H) 70 - 99 mg/dL  Glucose, capillary     Status: Abnormal   Collection Time: 07/25/18 11:17 AM  Result Value Ref Range   Glucose-Capillary 215 (H) 70 - 99 mg/dL   Comment 1 Notify RN   Glucose, capillary     Status: None   Collection Time: 07/25/18  4:26 PM   Result Value Ref Range  Glucose-Capillary 77 70 - 99 mg/dL   Comment 1 Notify RN   Glucose, capillary     Status: Abnormal   Collection Time: 07/25/18  9:00 PM  Result Value Ref Range   Glucose-Capillary 153 (H) 70 - 99 mg/dL  Glucose, capillary     Status: Abnormal   Collection Time: 07/26/18  7:02 AM  Result Value Ref Range   Glucose-Capillary 163 (H) 70 - 99 mg/dL  Glucose, capillary     Status: Abnormal   Collection Time: 07/26/18 11:07 AM  Result Value Ref Range   Glucose-Capillary 149 (H) 70 - 99 mg/dL   Comment 1 Notify RN     Blood Alcohol level:  Lab Results  Component Value Date   ETH <10 07/20/2018   ETH <10 16/02/9603    Metabolic Disorder Labs: Lab Results  Component Value Date   HGBA1C 9.6 (H) 07/21/2018   MPG 228.82 07/21/2018   MPG 240 07/07/2018   No results found for: PROLACTIN Lab Results  Component Value Date   CHOL 167 07/21/2018   TRIG 221 (H) 07/21/2018   HDL 48 07/21/2018   CHOLHDL 3.5 07/21/2018   VLDL 44 (H) 07/21/2018   LDLCALC 75 07/21/2018   LDLCALC 93 07/07/2018    Physical Findings: AIMS:  , ,  ,  ,    CIWA:  CIWA-Ar Total: 0 COWS:     Musculoskeletal: Strength & Muscle Tone: within normal limits Gait & Station: normal Patient leans: N/A  Psychiatric Specialty Exam: Physical Exam  Nursing note and vitals reviewed. Psychiatric: Her speech is normal. Thought content normal. Her mood appears anxious. Her affect is labile. She is withdrawn. Cognition and memory are impaired. She expresses impulsivity. She exhibits a depressed mood.    Review of Systems  Neurological: Negative.   Psychiatric/Behavioral: Positive for depression and memory loss. The patient is nervous/anxious.   All other systems reviewed and are negative.   Blood pressure (!) 136/119, pulse 75, temperature 97.9 F (36.6 C), temperature source Oral, resp. rate 18, height 5' (1.524 m), weight 72.6 kg, SpO2 97 %.Body mass index is 31.25 kg/m.  General  Appearance: Casual  Eye Contact:  Good  Speech:  Clear and Coherent  Volume:  Normal  Mood:  Anxious and Depressed  Affect:  Tearful  Thought Process:  Goal Directed and Descriptions of Associations: Intact  Orientation:  Full (Time, Place, and Person)  Thought Content:  WDL  Suicidal Thoughts:  No  Homicidal Thoughts:  No  Memory:  Immediate;   Fair Recent;   Fair Remote;   Fair  Judgement:  Impaired  Insight:  Present  Psychomotor Activity:  Decreased  Concentration:  Concentration: Poor and Attention Span: Poor  Recall:  Poor  Fund of Knowledge:  Fair  Language:  Fair  Akathisia:  No  Handed:  Right  AIMS (if indicated):     Assets:  Communication Skills Desire for Improvement Financial Resources/Insurance Housing Physical Health Resilience  ADL's:  Intact  Cognition:  WNL  Sleep:  Number of Hours: 6.5     Treatment Plan Summary: Daily contact with patient to assess and evaluate symptoms and progress in treatment and Medication management   Ms. Aro is a  73 year old female with a history of bipolar disorder admitted in a mixed episode.  #Mood, not improving -Depakote1000 mg nightly -Clonazepam 1 mg nightly -Prozac 40 mg daily -discontinue Trazodone 100 mg PRN as patient refuses  #DM -ADA diet -Glucotrol 10 mg daily -SSI  #HTN -Vasotec  20 mg  #Dyslipidemia -Zocor 20 mg daily  #Hypothyroidism -Synthroid 75 mcg daily  #Disposition -discharge to home -follow up with regular provider  Orson Slick, MD 07/26/2018, 1:31 PM

## 2018-07-27 LAB — GLUCOSE, CAPILLARY
Glucose-Capillary: 145 mg/dL — ABNORMAL HIGH (ref 70–99)
Glucose-Capillary: 65 mg/dL — ABNORMAL LOW (ref 70–99)
Glucose-Capillary: 126 mg/dL — ABNORMAL HIGH (ref 70–99)

## 2018-07-27 MED ORDER — LORATADINE 10 MG PO TABS: 10.0000 mg | ORAL_TABLET | Freq: Every day | ORAL | 1 refills | Status: DC

## 2018-07-27 MED ORDER — ENALAPRIL MALEATE 20 MG PO TABS: 20.0000 mg | ORAL_TABLET | Freq: Two times a day (BID) | ORAL | 1 refills | Status: DC

## 2018-07-27 MED ORDER — OLOPATADINE HCL 0.1 % OP SOLN: 1.0000 [drp] | Freq: Two times a day (BID) | OPHTHALMIC | 1 refills | Status: DC

## 2018-07-27 MED ORDER — CLONAZEPAM 1 MG PO TABS: 1.0000 mg | ORAL_TABLET | Freq: Every day | ORAL | 1 refills | Status: DC

## 2018-07-27 MED ORDER — SIMVASTATIN 20 MG PO TABS: 20.0000 mg | ORAL_TABLET | Freq: Every day | ORAL | 1 refills | Status: DC

## 2018-07-27 MED ORDER — FLUOXETINE HCL 40 MG PO CAPS: 40.0000 mg | ORAL_CAPSULE | Freq: Every day | ORAL | 1 refills | Status: DC

## 2018-07-27 MED ORDER — DIVALPROEX SODIUM ER 500 MG PO TB24: 1000.0000 mg | ORAL_TABLET | Freq: Every day | ORAL | 1 refills | Status: DC

## 2018-07-27 MED ORDER — GLIPIZIDE ER 10 MG PO TB24: 10.0000 mg | ORAL_TABLET | Freq: Two times a day (BID) | ORAL | 1 refills | Status: DC

## 2018-07-27 MED ORDER — TRAZODONE HCL 100 MG PO TABS: 100.0000 mg | ORAL_TABLET | Freq: Every day | ORAL | 1 refills | Status: DC

## 2018-07-27 MED ORDER — LEVOTHYROXINE SODIUM 75 MCG PO TABS: 75.0000 ug | ORAL_TABLET | ORAL | 1 refills | Status: DC

## 2018-07-27 NOTE — Tx Team (Signed)
Interdisciplinary Treatment and Diagnostic Plan Update  07/27/2018 Time of Session: 830am Kristan Brummitt MRN: [id number]  Principal Diagnosis: Bipolar 1 disorder, manic, moderate ([place])  Secondary Diagnoses: Principal Problem:   Bipolar 1 disorder, manic, moderate ([place]) Active Problems:   Essential hypertension   Hypothyroidism   Diabetes ([place])   Bipolar 1 disorder, mixed, severe (HCC)   Current Medications:  Current Facility-Administered Medications  Medication Dose Route Frequency Provider Last Rate Last Dose  . acetaminophen (TYLENOL) tablet 650 mg  650 mg Oral Q6H PRN Lavella Hammock, MD   650 mg at 07/26/18 2359  . alum & mag hydroxide-simeth (MAALOX/MYLANTA) 200-200-20 MG/5ML suspension 30 mL  30 mL Oral Q4H PRN Lavella Hammock, MD      . clonazePAM Bobbye [place]) tablet 1 mg  1 mg Oral QHS Clapacs, Madie Reno, MD   1 mg at 07/26/18 2115  . divalproex (DEPAKOTE ER) 24 hr tablet 1,000 mg  1,000 mg Oral QHS Lavella Hammock, MD   1,000 mg at 07/26/18 2115  . enalapril (VASOTEC) tablet 20 mg  20 mg Oral BID Lavella Hammock, MD   20 mg at 07/27/18 0177  . FLUoxetine (PROZAC) capsule 40 mg  40 mg Oral Daily Lavella Hammock, MD   40 mg at 07/27/18 9390  . glipiZIDE (GLUCOTROL XL) 24 hr tablet 10 mg  10 mg Oral BID Clapacs, Madie Reno, MD   10 mg at 07/27/18 0803  . hydrOXYzine (ATARAX/VISTARIL) tablet 25 mg  25 mg Oral Q6H PRN Lavella Hammock, MD   25 mg at 07/22/18 2145  . insulin aspart (novoLOG) injection 0-15 Units  0-15 Units Subcutaneous TID WC Lavella Hammock, MD   3 Units at 07/27/18 [phone]  . levothyroxine (SYNTHROID, LEVOTHROID) tablet 75 mcg  75 mcg Oral Doroteo Bradford, MD   75 mcg at 07/27/18 [phone]  . loratadine (CLARITIN) tablet 10 mg  10 mg Oral Daily Lavella Hammock, MD   10 mg at 07/26/18 0834  . magnesium hydroxide (MILK OF MAGNESIA) suspension 30 mL  30 mL Oral Daily PRN Lavella Hammock, MD      . olopatadine (PATANOL) 0.1 % ophthalmic solution 1 drop  1 drop  Both Eyes BID Lavella Hammock, MD   1 drop at 07/27/18 [phone]  . simvastatin (ZOCOR) tablet 20 mg  20 mg Oral Daily Lavella Hammock, MD   20 mg at 07/27/18 2633  . traZODone (DESYREL) tablet 100 mg  100 mg Oral QHS Pucilowska, Jolanta B, MD   100 mg at 07/23/18 2109  . white petrolatum (VASELINE) gel   Topical PRN Pucilowska, Jolanta B, MD       PTA Medications: Medications Prior to Admission  Medication Sig Dispense Refill Last Dose  . cetirizine (ZYRTEC) 10 MG tablet Take 10 mg by mouth daily.   Past Week at Unknown time  . FLUoxetine (PROZAC) 20 MG capsule Take 1 capsule (20 mg total) by mouth daily. 30 capsule 3 Past Week at Unknown time  . glipiZIDE (GLUCOTROL XL) 5 MG 24 hr tablet Take 5 mg by mouth 2 (two) times daily.    Past Week at Unknown time  . Olopatadine HCl 0.2 % SOLN Place 1 drop into both eyes daily.   Past Week at Unknown time  . prazosin (MINIPRESS) 2 MG capsule Take 1 capsule (2 mg total) by mouth every morning. 30 capsule 1 Past Week at Unknown time  . QUEtiapine (SEROQUEL) 100 MG tablet  Take 1 tablet (100 mg total) by mouth at bedtime. 30 tablet 1 Past Week at Unknown time  . [DISCONTINUED] clonazePAM (KLONOPIN) 1 MG tablet Take 1 tablet (1 mg total) by mouth at bedtime. 30 tablet 0 Past Week at Unknown time  . [DISCONTINUED] enalapril (VASOTEC) 20 MG tablet Take 1 tablet (20 mg total) by mouth 2 (two) times daily. 30 tablet 0 Past Week at Unknown time  . [DISCONTINUED] levothyroxine (SYNTHROID, LEVOTHROID) 75 MCG tablet Take 1 tablet (75 mcg total) by mouth every morning. 30 tablet 0 Past Week at Unknown time  . [DISCONTINUED] simvastatin (ZOCOR) 20 MG tablet Take 1 tablet (20 mg total) by mouth daily. 30 tablet 0 Past Week at Unknown time  . [DISCONTINUED] traZODone (DESYREL) 100 MG tablet Take 1 tablet (100 mg total) by mouth at bedtime. 30 tablet 1 Past Week at Unknown time    Patient Stressors: Marital or family conflict Medication change or noncompliance  Patient  Strengths: Capable of independent living Communication skills Special hobby/interest  Treatment Modalities: Medication Management, Group therapy, Case management,  1 to 1 session with clinician, Psychoeducation, Recreational therapy.   Physician Treatment Plan for Primary Diagnosis: Bipolar 1 disorder, manic, moderate ([place]) Long Term Goal(s): Improvement in symptoms so as ready for discharge Improvement in symptoms so as ready for discharge   Short Term Goals: Ability to verbalize feelings will improve Ability to disclose and discuss suicidal ideas Ability to demonstrate self-control will improve Ability to maintain clinical measurements within normal limits will improve  Medication Management: Evaluate patient's response, side effects, and tolerance of medication regimen.  Therapeutic Interventions: 1 to 1 sessions, Unit Group sessions and Medication administration.  Evaluation of Outcomes: Progressing  Physician Treatment Plan for Secondary Diagnosis: Principal Problem:   Bipolar 1 disorder, manic, moderate (HCC) Active Problems:   Essential hypertension   Hypothyroidism   Diabetes ([place])   Bipolar 1 disorder, mixed, severe ([place])  Long Term Goal(s): Improvement in symptoms so as ready for discharge Improvement in symptoms so as ready for discharge   Short Term Goals: Ability to verbalize feelings will improve Ability to disclose and discuss suicidal ideas Ability to demonstrate self-control will improve Ability to maintain clinical measurements within normal limits will improve     Medication Management: Evaluate patient's response, side effects, and tolerance of medication regimen.  Therapeutic Interventions: 1 to 1 sessions, Unit Group sessions and Medication administration.  Evaluation of Outcomes: Progressing   RN Treatment Plan for Primary Diagnosis: Bipolar 1 disorder, manic, moderate ([place]) Long Term Goal(s): Knowledge of disease and therapeutic regimen to  maintain health will improve  Short Term Goals: Ability to demonstrate self-control, Ability to participate in decision making will improve, Ability to disclose and discuss suicidal ideas, Ability to identify and develop effective coping behaviors will improve and Compliance with prescribed medications will improve  Medication Management: RN will administer medications as ordered by provider, will assess and evaluate patient's response and provide education to patient for prescribed medication. RN will report any adverse and/or side effects to prescribing provider.  Therapeutic Interventions: 1 on 1 counseling sessions, Psychoeducation, Medication administration, Evaluate responses to treatment, Monitor vital signs and CBGs as ordered, Perform/monitor CIWA, COWS, AIMS and Fall Risk screenings as ordered, Perform wound care treatments as ordered.  Evaluation of Outcomes: Progressing   LCSW Treatment Plan for Primary Diagnosis: Bipolar 1 disorder, manic, moderate ([place]) Long Term Goal(s): Safe transition to appropriate next level of care at discharge, Engage patient in therapeutic group addressing interpersonal  concerns.  Short Term Goals: Engage patient in aftercare planning with referrals and resources  Therapeutic Interventions: Assess for all discharge needs, 1 to 1 time with Social worker, Explore available resources and support systems, Assess for adequacy in community support network, Educate family and significant other(s) on suicide prevention, Complete Psychosocial Assessment, Interpersonal group therapy.  Evaluation of Outcomes: Progressing   Progress in Treatment: Attending groups: Yes. Participating in groups: Yes. Taking medication as prescribed: Yes. Toleration medication: Yes. Family/Significant other contact made: No, will contact:  pt declined Patient understands diagnosis: Yes. Discussing patient identified problems/goals with staff: Yes. Medical problems stabilized or  resolved: Yes. Denies suicidal/homicidal ideation: Yes. Issues/concerns per patient self-inventory: No. Other: NA  New problem(s) identified: No, Describe:  none reported  New Short Term/Long Term Goal(s):"Get balanced, get my medication regulated so I don't have to come back here"  Patient Goals:  "Get balanced, get my medication regulated so I don't have to come back here"  Discharge Plan or Barriers: Pt previously seen at [facility], unsure if she would like to resume services. Pt request a day or so to decide where she would like her after care to be. 07-27-2018 pt states she no longer wants to be seen at [facility]. Pt scheduled to follow up at [facility].  Reason for Continuation of Hospitalization: Medication stabilization  Estimated Length of Stay: Discharge on 07/27/2018  Recreational Therapy: Patient Stressors: N/A Patient Goal: Patient will focus on task/topic with 2 prompts from staff within 5 recreation therapy group sessions  Attendees: Patient: 07/27/2018 1:42 PM  Physician: Alethia Berthold 07/27/2018 1:42 PM  Nursing:Gigi, Manirattui    07/27/2018 1:42 PM  RN Care Manager: 07/27/2018 1:42 PM  Social Worker: Sanjuana Kava Olivia Moton Ethel Rana 07/27/2018 1:42 PM  Recreational Therapist:  07/27/2018 1:42 PM  Other:  07/27/2018 1:42 PM  Other:  07/27/2018 1:42 PM  Other: 07/27/2018 1:42 PM    Scribe for Treatment Team: Yvette Rack, LCSW 07/27/2018 1:42 PM

## 2018-07-27 NOTE — [facility] Suicide Risk Assessment (Signed)
[facility] Discharge Suicide Risk Assessment   Principal Problem: Bipolar 1 disorder, manic, moderate ([place]) Discharge Diagnoses: Principal Problem:   Bipolar 1 disorder, manic, moderate (HCC) Active Problems:   Essential hypertension   Hypothyroidism   Diabetes ([place])   Bipolar 1 disorder, mixed, severe ([place])   Total Time spent with patient: 45 minutes  Musculoskeletal: Strength & Muscle Tone: within normal limits Gait & Station: normal Patient leans: N/A  Psychiatric Specialty Exam: Review of Systems  Constitutional: Negative.   HENT: Negative.   Eyes: Negative.   Respiratory: Negative.   Cardiovascular: Negative.   Gastrointestinal: Negative.   Musculoskeletal: Negative.   Skin: Negative.   Neurological: Negative.   Psychiatric/Behavioral: Negative.     Blood pressure (!) 149/79, pulse 75, temperature 97.9 F (36.6 C), temperature source Oral, resp. rate 18, height 5' (1.524 m), weight 72.6 kg, SpO2 97 %.Body mass index is 31.25 kg/m.  General Appearance: Fairly Groomed  Engineer, water::  Fair  Speech:  Clear and [id number]  Volume:  Normal  Mood:  Euthymic  Affect:  Appropriate  Thought Process:  Goal Directed  Orientation:  Full (Time, Place, and Person)  Thought Content:  Logical  Suicidal Thoughts:  No  Homicidal Thoughts:  No  Memory:  Immediate;   Fair Recent;   Fair Remote;   Fair  Judgement:  Fair  Insight:  Fair  Psychomotor Activity:  Normal  Concentration:  Fair  Recall:  [employer] [place]  Language: Fair  Akathisia:  No  Handed:  Right  AIMS (if indicated):     Assets:  Desire for Improvement Housing Resilience Social Support  Sleep:  Number of Hours: 7  Cognition: WNL  ADL's:  Intact   Mental Status Per Nursing Assessment::   On Admission:  NA  Demographic Factors:  Age 19 or older, Caucasian and Living alone  Loss Factors: Financial problems/change in socioeconomic status  Historical Factors: Impulsivity  Risk Reduction  Factors:   Religious beliefs about death, Positive social support and Positive therapeutic relationship  Continued Clinical Symptoms:  Bipolar Disorder:   Mixed State  Cognitive Features That Contribute To Risk:  Closed-mindedness    Suicide Risk:  Minimal: No identifiable suicidal ideation.  Patients presenting with no risk factors but with morbid ruminations; may be classified as minimal risk based on the severity of the depressive symptoms  Follow-up Information    [facility], [place] on 08/10/2018.   Contact information: [place] [address] [phone]           Plan Of Care/Follow-up recommendations:  Activity:  As tolerated Diet:  Heart healthy diet Other:  Follow-up with local mental health providers as directed by social work.  Continue current medications.  Alethia Berthold, MD 07/27/2018, 9:28 AM

## 2018-07-27 NOTE — Discharge Summary (Signed)
Physician Discharge Summary Note  Patient:  [name] is an 73 y.o., female MRN:  [id number] DOB:  23-Feb-1946 Patient phone:  [phone] (home)  Patient address:   [place] [address],  Total Time spent with patient: 45 minutes  Date of Admission:  07/21/2018 Date of Discharge: July 27, 2018  Reason for Admission: Patient was admitted because of return of severe depression with mood instability disorganized thinking poor self-care suicidal thoughts.  Principal Problem: Bipolar 1 disorder, manic, moderate ([place]) Discharge Diagnoses: Principal Problem:   Bipolar 1 disorder, manic, moderate ([facility]) Active Problems:   Essential hypertension   Hypothyroidism   Diabetes ([place])   Bipolar 1 disorder, mixed, severe ([place])   Past Psychiatric History: Patient has a long history of bipolar disorder and has presented both with depressed mixed and manic features.  Tends to minimize her symptoms and to avoid mood stabilizers.  Multiple prior hospitalizations.  Past Medical History:  Past Medical History:  Diagnosis Date  . Arthritis   . Cancer [facility]) 2004   right breast ca  . Diabetes mellitus without complication ([place])   . Hypercholesterolemia   . Hypertension   . Hypothyroid   . Seasonal allergies     Past Surgical History:  Procedure Laterality Date  . ABDOMINAL HYSTERECTOMY    . BREAST BIOPSY Left    neg  . BREAST EXCISIONAL BIOPSY Right 2004   breast ca and lumpectomy  . BREAST LUMPECTOMY Right 2004   breast ca  . CHOLECYSTECTOMY    . COLONOSCOPY WITH PROPOFOL N/A 02/13/2017   Procedure: COLONOSCOPY WITH PROPOFOL;  Surgeon: Jonathon Bellows, MD;  Location: [facility] ENDOSCOPY;  Service: Gastroenterology;  Laterality: N/A;  . KIDNEY STONE SURGERY     Family History:  Family History  Problem Relation Age of Onset  . Bladder Cancer Neg Hx   . Kidney cancer Neg Hx    Family Psychiatric  History: None Social History:  Social History   Substance  and Sexual Activity  Alcohol Use No     Social History   Substance and Sexual Activity  Drug Use No    Social History   Socioeconomic History  . Marital status: Divorced    Spouse name: Not on file  . Number of children: Not on file  . Years of education: Not on file  . Highest education level: Not on file  Occupational History  . Not on file  Social Needs  . Financial resource strain: Not on file  . Food insecurity:    Worry: Not on file    Inability: Not on file  . Transportation needs:    Medical: Not on file    Non-medical: Not on file  Tobacco Use  . Smoking status: Former Smoker    Types: Cigarettes  . Smokeless tobacco: Never Used  Substance and Sexual Activity  . Alcohol use: No  . Drug use: No  . Sexual activity: Not Currently  Lifestyle  . Physical activity:    Days per week: Not on file    Minutes per session: Not on file  . Stress: Not on file  Relationships  . Social connections:    Talks on phone: Not on file    Gets together: Not on file    Attends religious service: Not on file    Active member of club or organization: Not on file    Attends meetings of clubs or organizations: Not on file    Relationship status: Not on  file  Other Topics Concern  . Not on file  Social History Narrative  . Not on file    Hospital Course: Patient was admitted to the psychiatric ward.  Because of her manic or mixed condition it was strongly advised to the patient that we get her on a mood stabilizer this time.  She was agreeable to taking Depakote again.  Also continued antidepressants.  Patient initially was labile with tearfulness and disorganized thinking that showed gradual improvement.  She was able to participate in groups and activities appropriately.  My only concern at discharge is that she is still stating that she will not follow-up in [place] which I think represents some paranoid and inappropriate thinking on her part.  Nevertheless she is not suicidal or  homicidal and is agreeable to going back to her outpatient treatment.  No new medical problems.  Physical Findings: AIMS:  , ,  ,  ,    CIWA:  CIWA-Ar Total: 0 COWS:     Musculoskeletal: Strength & Muscle Tone: within normal limits Gait & Station: normal Patient leans: N/A  Psychiatric Specialty Exam: Physical Exam  Nursing note and vitals reviewed. Constitutional: She appears well-developed and well-nourished.  HENT:  Head: Normocephalic and atraumatic.  Eyes: Pupils are equal, round, and reactive to light. Conjunctivae are normal.  Neck: Normal range of motion.  Cardiovascular: Regular rhythm and normal heart sounds.  Respiratory: Effort normal. No respiratory distress.  GI: Soft.  Musculoskeletal: Normal range of motion.  Neurological: She is alert.  Skin: Skin is warm and dry.  Psychiatric: She has a normal mood and affect. Her speech is normal and behavior is normal. Judgment and thought content normal. Her affect is not blunt. Cognition and memory are normal.    Review of Systems  Constitutional: Negative.   HENT: Negative.   Eyes: Negative.   Respiratory: Negative.   Cardiovascular: Negative.   Gastrointestinal: Negative.   Musculoskeletal: Negative.   Skin: Negative.   Neurological: Negative.   Psychiatric/Behavioral: Negative.     Blood pressure (!) 149/79, pulse 75, temperature 97.9 F (36.6 C), temperature source Oral, resp. rate 18, height 5' (1.524 m), weight 72.6 kg, SpO2 97 %.Body mass index is 31.25 kg/m.  General Appearance: Casual  Eye Contact:  Good  Speech:  Clear and Coherent  Volume:  Normal  Mood:  Euthymic  Affect:  Congruent  Thought Process:  Goal Directed  Orientation:  Full (Time, Place, and Person)  Thought Content:  Logical  Suicidal Thoughts:  No  Homicidal Thoughts:  No  Memory:  Immediate;   Fair Recent;   Fair Remote;   Fair  Judgement:  Fair  Insight:  Fair  Psychomotor Activity:  Normal  Concentration:  Concentration:  Fair  Recall:  [place] of Knowledge:  Fair  Language:  Fair  Akathisia:  No  Handed:  Right  AIMS (if indicated):     Assets:  Desire for Improvement Housing Resilience  ADL's:  Intact  Cognition:  WNL  Sleep:  Number of Hours: 7        Has this patient used any form of tobacco in the last 30 days? (Cigarettes, Smokeless Tobacco, Cigars, and/or Pipes) Yes, No  Blood Alcohol level:  Lab Results  Component Value Date   ETH <10 07/20/2018   ETH <10 03/05/5101    Metabolic Disorder Labs:  Lab Results  Component Value Date   HGBA1C 9.6 (H) 07/21/2018   MPG 228.82 07/21/2018   MPG 240 07/07/2018  No results found for: PROLACTIN Lab Results  Component Value Date   CHOL 167 07/21/2018   TRIG 221 (H) 07/21/2018   HDL 48 07/21/2018   CHOLHDL 3.5 07/21/2018   VLDL 44 (H) 07/21/2018   LDLCALC 75 07/21/2018   LDLCALC 93 07/07/2018    See Psychiatric Specialty Exam and Suicide Risk Assessment completed by Attending Physician prior to discharge.  Discharge destination:  Home  Is patient on multiple antipsychotic therapies at discharge:  No   Has Patient had three or more failed trials of antipsychotic monotherapy by history:  No  Recommended Plan for Multiple Antipsychotic Therapies: NA  Discharge Instructions    Diet - low sodium heart healthy   Complete by:  As directed    Increase activity slowly   Complete by:  As directed      Allergies as of 07/27/2018      Reactions   Navane [thiothixene] Other (See Comments)   Reaction:  Unknown   Penicillins Rash   Has patient had a PCN reaction causing immediate rash, facial/tongue/throat swelling, SOB or lightheadedness with hypotension: No Has patient had a PCN reaction causing severe rash involving mucus membranes or skin necrosis: No Has patient had a PCN reaction that required hospitalization: No Has patient had a PCN reaction occurring within the last 10 years: No If all of the above answers are "NO", then may  proceed with Cephalosporin use.      Medication List    STOP taking these medications   cetirizine 10 MG tablet Commonly known as:  ZYRTEC   Olopatadine HCl 0.2 % Soln Replaced by:  olopatadine 0.1 % ophthalmic solution   prazosin 2 MG capsule Commonly known as:  MINIPRESS   QUEtiapine 100 MG tablet Commonly known as:  SEROQUEL     TAKE these medications     Indication  clonazePAM 1 MG tablet Commonly known as:  KLONOPIN Take 1 tablet (1 mg total) by mouth at bedtime.  Indication:  Manic-Depression   divalproex 500 MG 24 hr tablet Commonly known as:  DEPAKOTE ER Take 2 tablets (1,000 mg total) by mouth at bedtime.  Indication:  MIXED BIPOLAR AFFECTIVE DISORDER   enalapril 20 MG tablet Commonly known as:  VASOTEC Take 1 tablet (20 mg total) by mouth 2 (two) times daily.  Indication:  High Blood Pressure Disorder   FLUoxetine 40 MG capsule Commonly known as:  PROZAC Take 1 capsule (40 mg total) by mouth daily. Start taking on:  July 28, 2018 What changed:    medication strength  how much to take  Indication:  Depression   glipiZIDE 10 MG 24 hr tablet Commonly known as:  GLUCOTROL XL Take 1 tablet (10 mg total) by mouth 2 (two) times daily. What changed:    medication strength  how much to take  Indication:  Type 2 Diabetes   levothyroxine 75 MCG tablet Commonly known as:  SYNTHROID, LEVOTHROID Take 1 tablet (75 mcg total) by mouth every morning.  Indication:  Underactive Thyroid   loratadine 10 MG tablet Commonly known as:  CLARITIN Take 1 tablet (10 mg total) by mouth daily.  Indication:  Perennial Allergic Rhinitis   olopatadine 0.1 % ophthalmic solution Commonly known as:  PATANOL Place 1 drop into both eyes 2 (two) times daily. Replaces:  Olopatadine HCl 0.2 % Soln  Indication:  Allergic Conjunctivitis   simvastatin 20 MG tablet Commonly known as:  ZOCOR Take 1 tablet (20 mg total) by mouth daily.  Indication:  Cerebrovascular  Accident or  Stroke   traZODone 100 MG tablet Commonly known as:  DESYREL Take 1 tablet (100 mg total) by mouth at bedtime.  Indication:  [place], [place] on 08/10/2018.   Why:  Please follow up with Dr. Lyn Henri on Monday, August 10, 2018 at 11am. Please bring photo ID, medication and insurance card. Thank you.  Contact information: [place] [address] [phone]           Follow-up recommendations:  Activity:  Activity as tolerated Diet:  Watch her diet and control your diabetes so you do not have to use insulin regularly Other:  Patient is going to follow-up in [place].  Also has potential referrals to [place].  She knows she can also consider going back to [place] if she changes her mind  Comments: Patient is calm and lucid and does not appear to be psychotic.  Prescriptions for medicine provided.  She is to follow-up as an outpatient continue medicine management  Signed: Alethia Berthold, MD 07/27/2018, 5:02 PM

## 2018-07-27 NOTE — Progress Notes (Signed)
Recreation Therapy Notes  INPATIENT RECREATION TR PLAN  Patient Details Name: [name] MRN: [id number] DOB: [birth year] Today's Date: 07/27/2018  Rec Therapy Plan Is patient appropriate for Therapeutic Recreation?: Yes Treatment times per week: at least 3 Estimated Length of Stay: 5-7 days TR Treatment/Interventions: Group participation (Comment)  Discharge Criteria Pt will be discharged from therapy if:: Discharged Treatment plan/goals/alternatives discussed and agreed upon by:: Patient/family  Discharge Summary Short term goals set: Patient will focus on task/topic with 2 prompts from staff within 5 recreation therapy group sessions Short term goals met: Complete Progress toward goals comments: Groups attended Which groups?: Coping skills, Leisure education, Other (Comment)(Relaxation, Strengths) Reason goals not met: N/A Therapeutic equipment acquired: N/A Reason patient discharged from therapy: Discharge from hospital Pt/family agrees with progress & goals achieved: Yes Date patient discharged from therapy: 07/27/18   [name] 07/27/2018, 12:26 PM

## 2018-07-27 NOTE — Progress Notes (Signed)
Patient denies SI/HI, denies A/V hallucinations. Patient verbalizes understanding of discharge instructions, follow up care and prescriptions. Patient given all belongings from [facility] locker. Patient escorted out by staff, transported by cab.

## 2018-07-27 NOTE — Progress Notes (Signed)
  [facility] Adult Case Management Discharge Plan :  Will you be returning to the same living situation after discharge:  Yes,  pt lives alone At discharge, do you have transportation home?: Yes,  CSW will assist with transportation Do you have the ability to pay for your medications: Yes,  insurance  Release of information consent forms completed and in the chart;  Patient's signature needed at discharge.  Patient to Follow up at: Follow-up Information    [facility] [place] on 08/10/2018.   Why:  Please follow up with Dr. Lyn Henri on Monday, August 10, 2018 at 11am. Please bring photo ID, medication and insurance card. Thank you.  Contact information: [place] [address] [phone]           Next level of care provider has access to [place] and Suicide Prevention discussed: Yes,  with pt; pt declined family contact     Has patient been referred to the Quitline?: N/A patient is not a smoker  Patient has been referred for addiction treatment: N/A  Yvette Rack, LCSW 07/27/2018, 9:35 AM

## 2018-07-27 NOTE — Progress Notes (Signed)
Recreation Therapy Notes  Date: 07/27/2018  Time: 9:30 am  Location: Craft Room  Behavioral response: Appropriate   Intervention Topic: Strengths  Discussion/Intervention:  Group content today was focused on strengths. The group identified some of the strengths they have. Individuals stated reason why they do not use their strengths. Patients expressed what strengths others see in them. The group identified important reason to use their strengths. The group participated in the intervention "Picking strengths", where they had a chance to identify some of their strengths. Clinical Observations/Feedback:  Patient came to group and defined empathy and persistence as strengths she has. She explained that she could be working on setting boundaries to improve her strengths. Individual was social with peers and staff while participating in group. Akhila Mahnken LRT/CTRS         Florrie Ramires 07/27/2018 12:06 PM

## 2018-07-27 NOTE — Progress Notes (Signed)
Pt refused her scheduled Trazodone stated " I cant not mix it with Depakote, it will make me sleep all day."

## 2018-07-29 ENCOUNTER — Other Ambulatory Visit: Payer: Self-pay | Admitting: Internal Medicine

## 2018-07-29 DIAGNOSIS — Z1231 Encounter for screening mammogram for malignant neoplasm of breast: Secondary | ICD-10-CM

## 2018-11-09 ENCOUNTER — Other Ambulatory Visit: Payer: Self-pay

## 2018-11-09 ENCOUNTER — Ambulatory Visit
Admission: RE | Admit: 2018-11-09 | Discharge: 2018-11-09 | Disposition: A | Discharge status: Home / Self Care | Payer: Medicare Other | Source: Ambulatory Visit | Attending: Internal Medicine | Admitting: Internal Medicine

## 2018-11-09 DIAGNOSIS — Z1231 Encounter for screening mammogram for malignant neoplasm of breast: Secondary | ICD-10-CM | POA: Diagnosis present

## 2018-11-09 HISTORY — DX: Malignant neoplasm of unspecified site of unspecified female breast: C50.919

## 2018-11-09 IMAGING — MG DIGITAL SCREENING BILATERAL MAMMOGRAM WITH TOMO AND CAD
8 series · 8 of 24 positions shown · non-contrast
Comparison: Previous exam(s).

CLINICAL DATA: Screening.

EXAM:
DIGITAL SCREENING BILATERAL MAMMOGRAM WITH TOMO AND CAD

[R CC synth-2D]
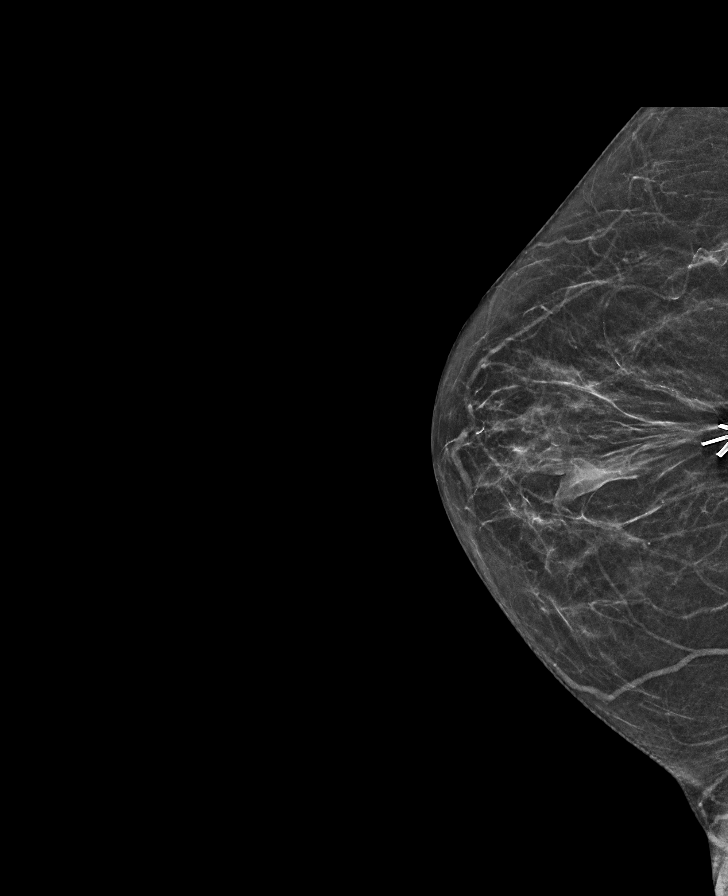

[R MLO synth-2D]
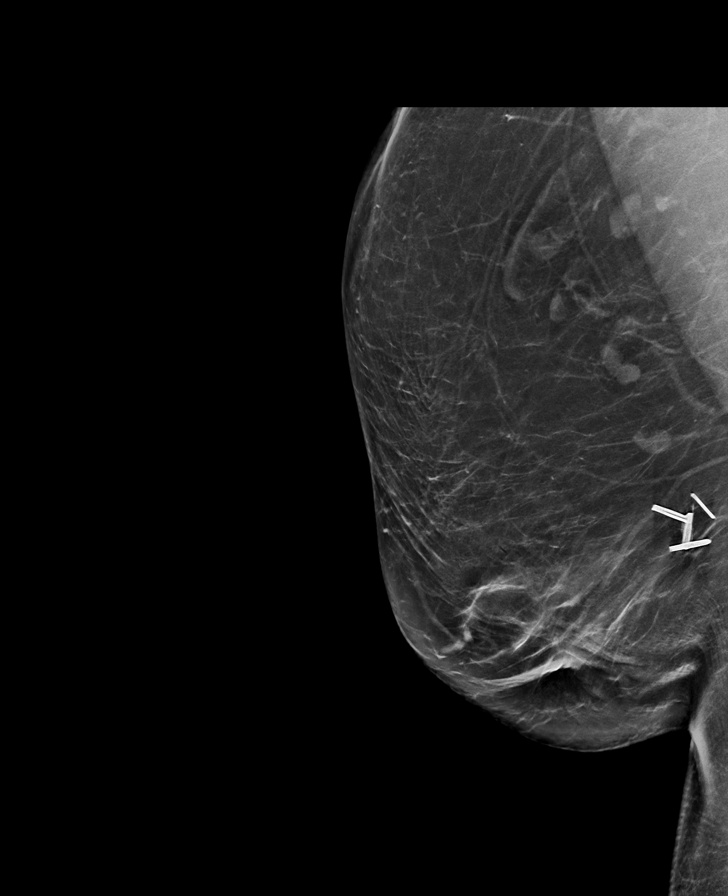

[L CC synth-2D]
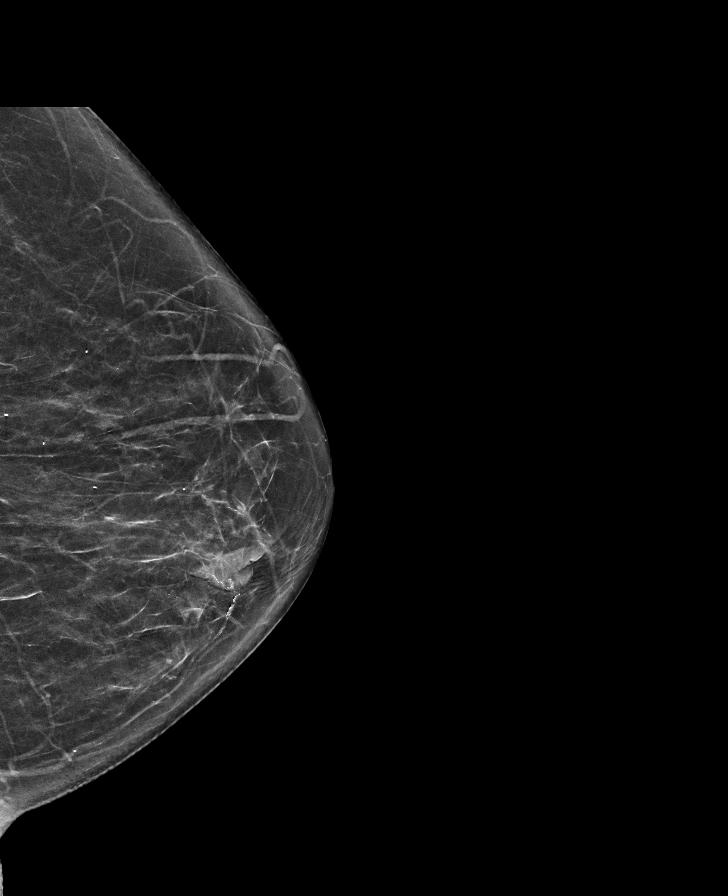

[L MLO synth-2D]
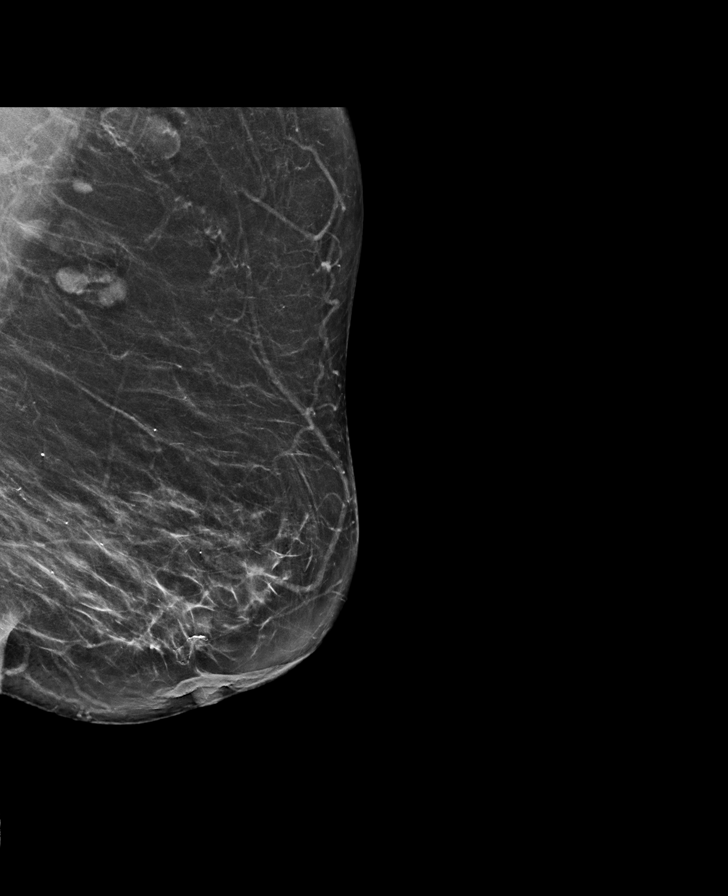

[L CC tomo · tomo slice 33/64.0]
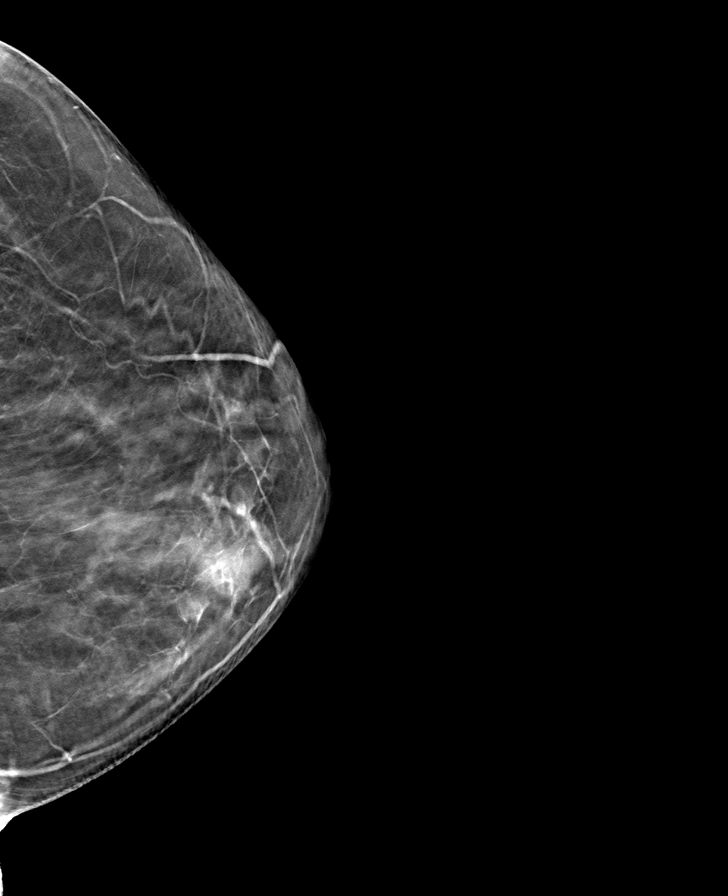

[L MLO tomo · tomo slice 35/70.0]
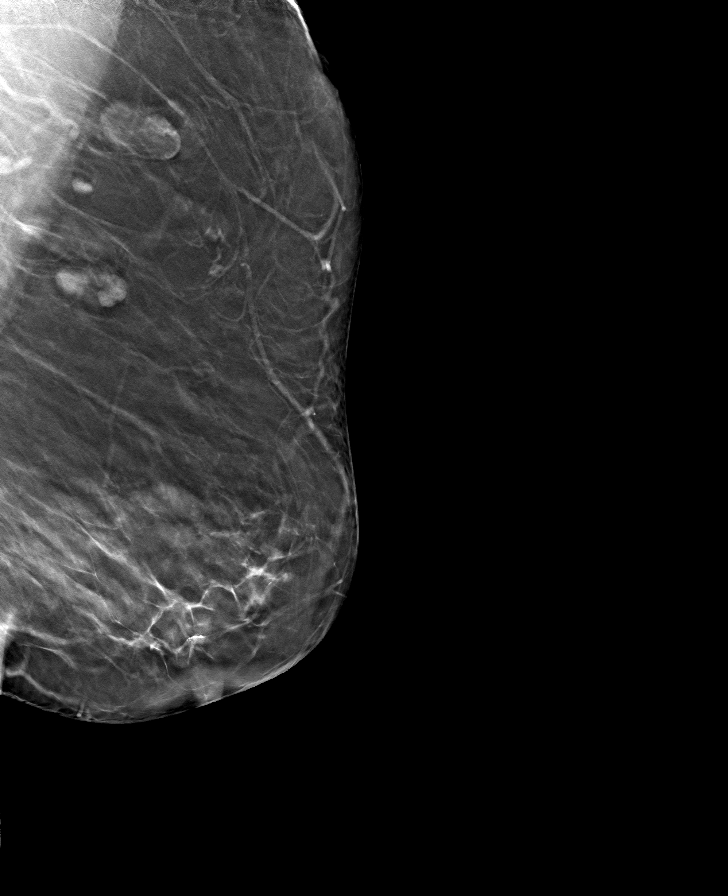

[R CC tomo · tomo slice 24/47.0]
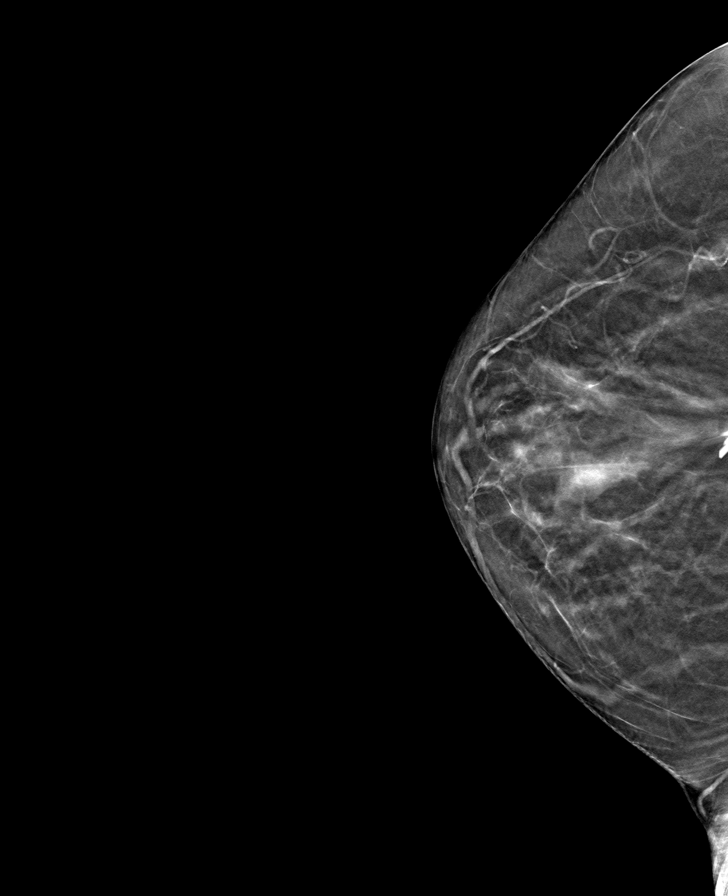

[R MLO tomo · tomo slice 39/77.0]
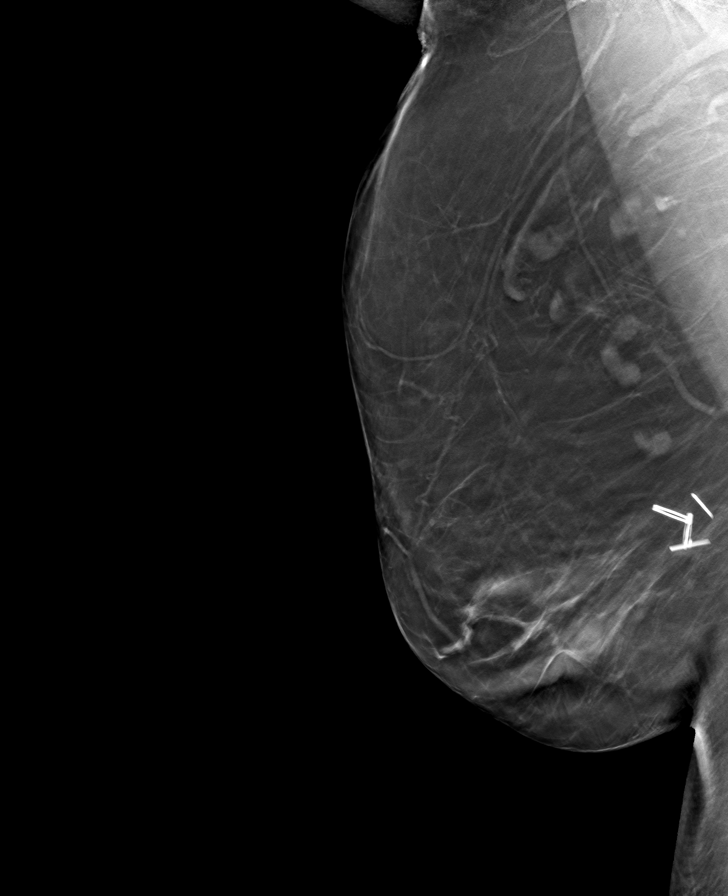

[8 of 24 positions shown; findings below may reference images not displayed]

ACR Breast Density Category b: There are scattered areas of
fibroglandular density.
FINDINGS: There are no findings suspicious for malignancy. Images were
processed with CAD.
IMPRESSION: No mammographic evidence of malignancy. A result letter of this
screening mammogram will be mailed directly to the patient.

RECOMMENDATION:
Screening mammogram in one year. (Code:[TQ])

BI-RADS CATEGORY  1: Negative.

## 2018-11-23 ENCOUNTER — Encounter: Payer: Self-pay | Admitting: Podiatry

## 2018-11-23 ENCOUNTER — Other Ambulatory Visit: Payer: Self-pay

## 2018-11-23 ENCOUNTER — Ambulatory Visit (INDEPENDENT_AMBULATORY_CARE_PROVIDER_SITE_OTHER): Payer: Medicare Other | Admitting: Podiatry

## 2018-11-23 DIAGNOSIS — E119 Type 2 diabetes mellitus without complications: Secondary | ICD-10-CM

## 2018-11-23 DIAGNOSIS — M79675 Pain in left toe(s): Secondary | ICD-10-CM

## 2018-11-23 DIAGNOSIS — B351 Tinea unguium: Secondary | ICD-10-CM | POA: Diagnosis not present

## 2018-11-23 DIAGNOSIS — M79674 Pain in right toe(s): Secondary | ICD-10-CM | POA: Diagnosis not present

## 2018-11-23 NOTE — Progress Notes (Signed)
This patient presents to the office with chief complaint of long thick nails and diabetic feet.  This patient  says there  is   pain and discomfort in her big toenails both feet..  This patient says there are long thick ingrown  painful nails.  These nails are painful walking and wearing shoes.  Patient has no history of infection or drainage from both feet.  Patient is unable to  self treat his own nails . This patient presents  to the office today for treatment of the  long nails and a foot evaluation due to history of  diabetes.  General Appearance  Alert, conversant and in no acute stress.  Vascular  Dorsalis pedis and posterior tibial  pulses are palpable  bilaterally.  Capillary return is within normal limits  bilaterally. Temperature is within normal limits  bilaterally.  Neurologic  Senn-Weinstein monofilament wire test within normal limits  bilaterally. Muscle power within normal limits bilaterally.  Nails Thick disfigured discolored nails with subungual debris  from hallux to fifth toes bilaterally. No evidence of bacterial infection or drainage bilaterally.  Orthopedic  No limitations of motion of motion feet .  No crepitus or effusions noted.  No bony pathology or digital deformities noted.  HAV  B/L.  Skin  normotropic skin with no porokeratosis noted bilaterally.  No signs of infections or ulcers noted.     Onychomycosis  Diabetes with no foot complications  IE  Debride nails x 2.  A diabetic foot exam was performed and there is no evidence of any vascular or neurologic pathology.   RTC 3 months.   Gardiner Barefoot DPM

## 2018-12-24 ENCOUNTER — Telehealth: Payer: Self-pay

## 2018-12-24 NOTE — Telephone Encounter (Signed)
Patient contacted the office in regards to scheduling her 5 year repeat colonoscopy.  Reviewed patients colonoscopy and result letter.  Advised patient per Dr. Georgeann Oppenheim result letter that her last colonoscopy was on 02/13/17 and her results letter stated to repeat in 5 years.  She said thank you, and I told her she would get a letter from the office once its time to schedule.  Thanks [employer]

## 2019-02-01 ENCOUNTER — Encounter: Payer: Self-pay | Admitting: Podiatry

## 2019-02-01 ENCOUNTER — Ambulatory Visit (INDEPENDENT_AMBULATORY_CARE_PROVIDER_SITE_OTHER): Payer: Medicare HMO | Admitting: Podiatry

## 2019-02-01 ENCOUNTER — Other Ambulatory Visit: Payer: Self-pay

## 2019-02-01 DIAGNOSIS — M79674 Pain in right toe(s): Secondary | ICD-10-CM

## 2019-02-01 DIAGNOSIS — M79675 Pain in left toe(s): Secondary | ICD-10-CM | POA: Diagnosis not present

## 2019-02-01 DIAGNOSIS — E119 Type 2 diabetes mellitus without complications: Secondary | ICD-10-CM

## 2019-02-01 DIAGNOSIS — B351 Tinea unguium: Secondary | ICD-10-CM

## 2019-02-01 NOTE — Progress Notes (Addendum)
Complaint:  Visit Type: Patient returns to my office for continued preventative foot care services. Complaint: Patient states" my nails have grown long and thick and ingrown  and become painful to walk and wear shoes" Patient has been diagnosed with DM with no foot complications. The patient presents for preventative foot care services. No changes to ROS  Podiatric Exam: Vascular: dorsalis pedis and posterior tibial pulses are palpable bilateral. Capillary return is immediate. Temperature gradient is WNL. Skin turgor WNL  Sensorium: Normal Semmes Weinstein monofilament test. Normal tactile sensation bilaterally. Nail Exam: Pt has thick disfigured discolored nails with subungual debris noted bilateral hallux nails. Ulcer Exam: There is no evidence of ulcer or pre-ulcerative changes or infection. Orthopedic Exam: Muscle tone and strength are WNL. No limitations in general ROM. No crepitus or effusions noted. Foot type and digits show no abnormalities. HAV  B/L. Skin: No Porokeratosis. No infection or ulcers  Diagnosis:  Onychomycosis, , Pain in right toe, pain in left toes  Treatment & Plan Procedures and Treatment: Consent by patient was obtained for treatment procedures.   Debridement of mycotic and hypertrophic toenails, 1 through 5 bilateral and clearing of subungual debris. No ulceration, no infection noted.  Return Visit-Office Procedure: Patient instructed to return to the office for a follow up visit 3 months for continued evaluation and treatment.    Gardiner Barefoot DPM

## 2019-04-19 ENCOUNTER — Ambulatory Visit: Payer: Medicare HMO | Admitting: Podiatry

## 2019-05-11 DIAGNOSIS — N183 Chronic kidney disease, stage 3 unspecified: Secondary | ICD-10-CM | POA: Diagnosis present

## 2019-06-02 ENCOUNTER — Encounter: Payer: Self-pay | Admitting: Emergency Medicine

## 2019-06-02 ENCOUNTER — Other Ambulatory Visit: Payer: Self-pay

## 2019-06-02 ENCOUNTER — Emergency Department
Admission: EM | Admit: 2019-06-02 | Discharge: 2019-06-02 | Disposition: A | Discharge status: Home / Self Care | Payer: Medicare HMO | Attending: Student | Admitting: Student

## 2019-06-02 DIAGNOSIS — E119 Type 2 diabetes mellitus without complications: Secondary | ICD-10-CM | POA: Diagnosis not present

## 2019-06-02 DIAGNOSIS — Z79899 Other long term (current) drug therapy: Secondary | ICD-10-CM | POA: Diagnosis not present

## 2019-06-02 DIAGNOSIS — E039 Hypothyroidism, unspecified: Secondary | ICD-10-CM | POA: Diagnosis not present

## 2019-06-02 DIAGNOSIS — I10 Essential (primary) hypertension: Secondary | ICD-10-CM | POA: Diagnosis not present

## 2019-06-02 DIAGNOSIS — Z7984 Long term (current) use of oral hypoglycemic drugs: Secondary | ICD-10-CM | POA: Insufficient documentation

## 2019-06-02 DIAGNOSIS — F319 Bipolar disorder, unspecified: Secondary | ICD-10-CM | POA: Insufficient documentation

## 2019-06-02 DIAGNOSIS — Z853 Personal history of malignant neoplasm of breast: Secondary | ICD-10-CM | POA: Diagnosis not present

## 2019-06-02 DIAGNOSIS — R4589 Other symptoms and signs involving emotional state: Secondary | ICD-10-CM

## 2019-06-02 DIAGNOSIS — Z87891 Personal history of nicotine dependence: Secondary | ICD-10-CM | POA: Diagnosis not present

## 2019-06-02 DIAGNOSIS — F329 Major depressive disorder, single episode, unspecified: Secondary | ICD-10-CM | POA: Diagnosis present

## 2019-06-02 LAB — SALICYLATE LEVEL: Salicylate Lvl: <7 mg/dL — ABNORMAL LOW (ref 7.0–30.0)

## 2019-06-02 LAB — COMPREHENSIVE METABOLIC PANEL
ALT: 12 U/L (ref 0–44)
AST: 21 U/L (ref 15–41)
Albumin: 3.8 g/dL (ref 3.5–5.0)
Alkaline Phosphatase: 38 U/L (ref 38–126)
Anion gap: 9 (ref 5–15)
BUN: 20 mg/dL (ref 8–23)
CO2: 27 mmol/L (ref 22–32)
Calcium: 9.5 mg/dL (ref 8.9–10.3)
Chloride: 104 mmol/L (ref 98–111)
Creatinine, Ser: 1.4 mg/dL — ABNORMAL HIGH (ref 0.44–1.00)
GFR calc Af Amer: 43 mL/min — ABNORMAL LOW (ref >60)
GFR calc non Af Amer: 37 mL/min — ABNORMAL LOW (ref >60)
Glucose, Bld: 108 mg/dL — ABNORMAL HIGH (ref 70–99)
Potassium: 4.1 mmol/L (ref 3.5–5.1)
Sodium: 140 mmol/L (ref 135–145)
Total Bilirubin: 0.6 mg/dL (ref 0.3–1.2)
Total Protein: 7.4 g/dL (ref 6.5–8.1)

## 2019-06-02 LAB — URINE DRUG SCREEN, QUALITATIVE (ARMC ONLY)
Amphetamines, Ur Screen: NOT DETECTED
Barbiturates, Ur Screen: NOT DETECTED
Benzodiazepine, Ur Scrn: NOT DETECTED
Cannabinoid 50 Ng, Ur ~~LOC~~: NOT DETECTED
Cocaine Metabolite,Ur ~~LOC~~: NOT DETECTED
MDMA (Ecstasy)Ur Screen: NOT DETECTED
Methadone Scn, Ur: NOT DETECTED
Opiate, Ur Screen: NOT DETECTED
Phencyclidine (PCP) Ur S: NOT DETECTED
Tricyclic, Ur Screen: NOT DETECTED

## 2019-06-02 LAB — CBC
HCT: 47.9 % — ABNORMAL HIGH (ref 36.0–46.0)
Hemoglobin: 15.6 g/dL — ABNORMAL HIGH (ref 12.0–15.0)
MCH: 28.6 pg (ref 26.0–34.0)
MCHC: 32.6 g/dL (ref 30.0–36.0)
MCV: 87.9 fL (ref 80.0–100.0)
Platelets: 203 10*3/uL (ref 150–400)
RBC: 5.45 MIL/uL — ABNORMAL HIGH (ref 3.87–5.11)
RDW: 12.6 % (ref 11.5–15.5)
WBC: 6.7 10*3/uL (ref 4.0–10.5)
nRBC: 0 % (ref 0.0–0.2)

## 2019-06-02 LAB — ACETAMINOPHEN LEVEL: Acetaminophen (Tylenol), Serum: <10 ug/mL — ABNORMAL LOW (ref 10–30)

## 2019-06-02 LAB — TSH: TSH: 2.428 u[IU]/mL (ref 0.350–4.500)

## 2019-06-02 LAB — T4, FREE: Free T4: 1.05 ng/dL (ref 0.61–1.12)

## 2019-06-02 LAB — ETHANOL: Alcohol, Ethyl (B): <10 mg/dL (ref <10)

## 2019-06-02 NOTE — ED Notes (Signed)
Patient says she is just alone.  Says she shouldn't be here for this because it is not significant.  But then says she has a hard time because she has to take a cab to get groceries and then wait for them to come back.  [place].

## 2019-06-02 NOTE — Discharge Instructions (Signed)
Thank you for letting us take care of you in the emergency department today.  Please return to the ED immediately if you have ANY thoughts of hurting yourself or anyone else, so that we may help you.  Follow up with your doctor and/or therapist as soon as possible regarding today's ED visit.   Please continue to take your medications as prescribed.  Additional mental health/psychiatric assistance, as needed:  [place], [place] [address] Phone:  [phone] or [phone]  Open Access:   Walk-in ASSESSMENT hours, M-W-F, 8:00am - 3:00pm Advanced Acess CRISIS:  M-F, 8:00am - 8:00pm Outpatient Services Office Hours:  M-F, 8:00am - 5:00pm

## 2019-06-02 NOTE — ED Provider Notes (Addendum)
[facility] Emergency Department Provider Note  ____________________________________________   First MD Initiated Contact with Patient 06/02/19 [phone]     (approximate)  I have reviewed the triage vital signs and the nursing notes.  History  Chief Complaint Mental Health Problem    HPI [name] is a 74 y.o. female with hx of hypothyroidism, HTN, DM, PTSD, bipolar disorder who presents for worsening depression.  Patient states she has struggled with depression for her entire life, but today when she woke up this morning she felt it was particularly worse.  She is having thoughts of wanting to harm herself.  She states she "wants to cut her throat, but I would never do it, I am too afraid of pain."   She feels her main etiology of depression is that her life "has no purpose", she has no motivator to live.  She reports compliance with all of her medications, including her psychiatric medications.  She denies any alcohol or drug use.   Past Medical Hx Past Medical History:  Diagnosis Date  . Arthritis   . Breast cancer ([place])   . Cancer [facility]) 2004   right breast ca  . Diabetes mellitus without complication ([place])   . Hypercholesterolemia   . Hypertension   . Hypothyroid   . Seasonal allergies     Problem List Patient Active Problem List   Diagnosis Date Noted  . Pain due to onychomycosis of toenails of both feet 11/23/2018  . Bipolar 1 disorder, manic, moderate ([place]) 07/21/2018  . Bipolar 1 disorder, mixed, severe ([place]) 07/21/2018  . Bipolar 1 disorder with moderate mania ([place]) 07/20/2018  . Bipolar I disorder, most recent episode depressed with anxious distress ([place]) 07/07/2017  . PTSD (post-traumatic stress disorder) 07/07/2017  . 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 Diabetes ([place]) 09/23/2014  . Essential hypertension 09/21/2014  . Hypothyroidism 09/21/2014  . Dyslipidemia 09/21/2014  .  Bipolar I disorder, current or most recent episode manic, severe with mixed features ([place]) 09/20/2014    Past Surgical Hx Past Surgical History:  Procedure Laterality Date  . ABDOMINAL HYSTERECTOMY    . BREAST BIOPSY Left    neg  . BREAST EXCISIONAL BIOPSY Right 2004   breast ca and lumpectomy  . BREAST LUMPECTOMY Right 2004   breast ca  . CHOLECYSTECTOMY    . COLONOSCOPY WITH PROPOFOL N/A 02/13/2017   Procedure: COLONOSCOPY WITH PROPOFOL;  Surgeon: Jonathon Bellows, MD;  Location: [facility] ENDOSCOPY;  Service: Gastroenterology;  Laterality: N/A;  . KIDNEY STONE SURGERY      Medications Prior to Admission medications   Medication Sig Start Date End Date Taking? Authorizing Provider  clonazePAM (KLONOPIN) 1 MG tablet Take 1 tablet (1 mg total) by mouth at bedtime. 07/27/18   Clapacs, Madie Reno, MD  divalproex (DEPAKOTE ER) 500 MG 24 hr tablet Take 2 tablets (1,000 mg total) by mouth at bedtime. 07/27/18   Clapacs, Madie Reno, MD  enalapril (VASOTEC) 20 MG tablet Take 1 tablet (20 mg total) by mouth 2 (two) times daily. 07/27/18   Clapacs, Madie Reno, MD  FLUoxetine (PROZAC) 40 MG capsule Take 1 capsule (40 mg total) by mouth daily. 07/28/18   Clapacs, Madie Reno, MD  glipiZIDE (GLUCOTROL XL) 10 MG 24 hr tablet Take 1 tablet (10 mg total) by mouth 2 (two) times daily. 07/27/18   Clapacs, Madie Reno, MD  JANUVIA 100 MG tablet  11/11/18   [provider]  levothyroxine (SYNTHROID)  88 MCG tablet  11/09/18   [provider]  loratadine (CLARITIN) 10 MG tablet Take 1 tablet (10 mg total) by mouth daily. 07/27/18   Clapacs, Madie Reno, MD  PAZEO 0.7 % SOLN  08/25/18   [provider]  simvastatin (ZOCOR) 20 MG tablet Take 1 tablet (20 mg total) by mouth daily. 07/27/18   Clapacs, Madie Reno, MD  traZODone (DESYREL) 100 MG tablet Take 1 tablet (100 mg total) by mouth at bedtime. 07/27/18   Clapacs, Madie Reno, MD    Allergies Navane [thiothixene] and Penicillins  Family Hx Family History  Problem Relation Age of Onset   . Bladder Cancer Neg Hx   . Kidney cancer Neg Hx     Social Hx Social History   Tobacco Use  . Smoking status: Former Smoker    Types: Cigarettes  . Smokeless tobacco: Never Used  Substance Use Topics  . Alcohol use: No  . Drug use: No     Review of Systems  Constitutional: Negative for fever, chills. Eyes: Negative for visual changes. ENT: Negative for sore throat. Cardiovascular: Negative for chest pain. Respiratory: Negative for shortness of breath. Gastrointestinal: Negative for nausea, vomiting.  Genitourinary: Negative for dysuria. Musculoskeletal: Negative for leg swelling. Skin: Negative for rash. Neurological: Negative for for headaches.   Physical Exam  Vital Signs: ED Triage Vitals [06/02/19 0610]  Enc Vitals Group     BP 126/74     Pulse Rate (!) 55     Resp 20     Temp 98.2 F (36.8 C)     Temp Source Oral     SpO2 97 %     Weight 173 lb (78.5 kg)     Height 5\' 1"  (1.549 m)     Head Circumference      Peak Flow      Pain Score 0     Pain Loc      Pain Edu?      Excl. in [place]?     Constitutional: Alert and oriented.  Head: Normocephalic. Atraumatic. Eyes: Conjunctivae clear. Sclera anicteric. Nose: No congestion. No rhinorrhea. Mouth/Throat: Wearing a mask. Neck: No stridor.   Cardiovascular: Normal rate. Extremities well perfused. Respiratory: Normal respiratory effort.  Lungs clear anteriorly. Gastrointestinal: Non-distended.  Nontender. Musculoskeletal: No deformities. Neurologic:  Normal speech and language. No gross focal neurologic deficits are appreciated.  Skin: Skin is warm, dry and intact.  Psychiatric: Reports significant depression, somewhat tearful, calm and cooperative.  EKG  N/A    Radiology  N/A   Procedures  Procedure(s) performed (including critical care):  Procedures   Initial Impression / Assessment and Plan / ED Course  74 y.o. female who presents to the ED for worsening depression, as  above.  Suspect patient's presentation is related to their known psychiatric diagnosis. No evidence of underlying metabolic, infectious, or toxicologic etiology. Will obtain basic screening labs and consult psychiatry and TTS.   Patient is comfortable with this plan, remains voluntary.  Labs without actionable derangements. Awaiting psychiatry.   10:47 AM Patient has been seen and evaluated by psychiatry team and deemed appropriate for discharge. Patient determined not to be a threat to themselves or others.  Patient is otherwise medically clear. Patient has medications and outpatient resources already in place. Will plan for discharge with outpatient follow up. Given return precautions.    Final Clinical Impression(s) / ED Diagnosis  Final diagnoses:  Depressed mood       Note:  This document was  prepared using Systems analyst and may include unintentional dictation errors.     Lilia Pro., MD 06/02/19 1048

## 2019-06-02 NOTE — ED Notes (Signed)
In triage 1.  Pt dressed in burgundy scrubs.  Pts belongings, which include a black purse, bag,, shoes, pants, underwear, top, bra, jacket, bagged, labelled and placed at nurses station.  Pt has a total of 2 bags.  Pt waiting in subwait for a room with myself as a 1:1 sitter.  Pt has glasses on her person.

## 2019-06-02 NOTE — ED Notes (Signed)
Patient in nad.  On stretcher.  Explained that med rec is being done.

## 2019-06-02 NOTE — BH Assessment (Signed)
Assessment Note  [name] is an 74 y.o. female who presents to the ER due to feeling of depression. Throughout the interview, the patient denied SI/HI and AV/H. She states, she need someone to talk to and that's why she came to the ER. During the interview, the patient was calm, cooperative and pleasant. She denies the use of any mind-altering substances.  Diagnosis: Bipolar  Past Medical History:  Past Medical History:  Diagnosis Date  . Arthritis   . Breast cancer ([place])   . Cancer [facility]) 2004   right breast ca  . Diabetes mellitus without complication ([place])   . Hypercholesterolemia   . Hypertension   . Hypothyroid   . Seasonal allergies     Past Surgical History:  Procedure Laterality Date  . ABDOMINAL HYSTERECTOMY    . BREAST BIOPSY Left    neg  . BREAST EXCISIONAL BIOPSY Right 2004   breast ca and lumpectomy  . BREAST LUMPECTOMY Right 2004   breast ca  . CHOLECYSTECTOMY    . COLONOSCOPY WITH PROPOFOL N/A 02/13/2017   Procedure: COLONOSCOPY WITH PROPOFOL;  Surgeon: Jonathon Bellows, MD;  Location: [facility] ENDOSCOPY;  Service: Gastroenterology;  Laterality: N/A;  . KIDNEY STONE SURGERY      Family History:  Family History  Problem Relation Age of Onset  . Bladder Cancer Neg Hx   . Kidney cancer Neg Hx     Social History:  reports that she has quit smoking. Her smoking use included cigarettes. She has never used smokeless tobacco. She reports that she does not drink alcohol or use drugs.  Additional Social History:  Alcohol / Drug Use Pain Medications: See PTA Prescriptions: See PTA Over the Counter: See PTA History of alcohol / drug use?: No history of alcohol / drug abuse Longest period of sobriety (when/how long): Unable to quantify  CIWA: CIWA-Ar BP: 105/75 Pulse Rate: (!) 56 COWS:    Allergies:  Allergies  Allergen Reactions  . Navane [Thiothixene] Other (See Comments)    Reaction:  Unknown  . Penicillins Rash    Has patient had a PCN reaction  causing immediate rash, facial/tongue/throat swelling, SOB or lightheadedness with hypotension: No Has patient had a PCN reaction causing severe rash involving mucus membranes or skin necrosis: No Has patient had a PCN reaction that required hospitalization: No Has patient had a PCN reaction occurring within the last 10 years: No If all of the above answers are "NO", then may proceed with Cephalosporin use.     Home Medications: (Not in a hospital admission)   OB/GYN Status:  No LMP recorded. Patient has had a hysterectomy.  General Assessment Data Location of Assessment: [facility] ED TTS Assessment: In system Is this a Tele or Face-to-Face Assessment?: Face-to-Face Is this an Initial Assessment or a Re-assessment for this encounter?: Initial Assessment Patient Accompanied by:: N/A Language Other than English: No Living Arrangements: Other (Comment)(Private Home) What gender do you identify as?: Female Marital status: Divorced Pregnancy Status: No Can pt return to current living arrangement?: No Admission Status: Voluntary Is patient capable of signing voluntary admission?: Yes Referral Source: Self/Family/Friend Insurance type: [employer] Medicare  Medical Screening Exam ([place]) Medical Exam completed: Yes  Crisis Care Plan Legal Guardian: Other:(Self) Name of Psychiatrist: Reports of none Name of Therapist: Reports of none  Education Status Is patient currently in school?: No Is the patient employed, unemployed or receiving disability?: Unemployed, Receiving disability income  Risk to self with the past 6 months Suicidal Ideation: No  Has patient been a risk to self within the past 6 months prior to admission? : No Suicidal Intent: No Has patient had any suicidal intent within the past 6 months prior to admission? : No Is patient at risk for suicide?: No Suicidal Plan?: No Has patient had any suicidal plan within the past 6 months prior to admission? : No Access  to Means: No What has been your use of drugs/alcohol within the last 12 months?: Reports of none Previous Attempts/Gestures: No How many times?: 0 Other Self Harm Risks: Reports of none Triggers for Past Attempts: None known Family Suicide History: Unknown Recent stressful life event(s): Other (Comment) Persecutory voices/beliefs?: No Depression: No Depression Symptoms: Isolating, Feeling worthless/self pity Substance abuse history and/or treatment for substance abuse?: No Suicide prevention information given to non-admitted patients: Not applicable  Risk to Others within the past 6 months Homicidal Ideation: No Does patient have any lifetime risk of violence toward others beyond the six months prior to admission? : No Thoughts of Harm to Others: No Current Homicidal Intent: No Current Homicidal Plan: No Access to Homicidal Means: No Identified Victim: Reports of none History of harm to others?: No Assessment of Violence: None Noted Violent Behavior Description: Reports of none Does patient have access to weapons?: No Criminal Charges Pending?: No Does patient have a court date: No Is patient on probation?: No  Psychosis Hallucinations: None noted Delusions: None noted  Mental Status Report Appearance/Hygiene: Unremarkable, In scrubs Eye Contact: Fair Motor Activity: Freedom of movement, Unremarkable Speech: Logical/coherent, Unremarkable Level of Consciousness: Alert Mood: Sad, Pleasant Affect: Appropriate to circumstance, Sad Anxiety Level: None Thought Processes: Coherent, Relevant Judgement: Unimpaired Orientation: Person, Place, Time, Situation, Appropriate for developmental age Obsessive Compulsive Thoughts/Behaviors: None  Cognitive Functioning Concentration: Normal Memory: Recent Intact, Remote Intact Is patient IDD: No Insight: Fair Impulse Control: Fair Appetite: Good Have you had any weight changes? : No Change Sleep: No Change Total Hours of Sleep:  8 Vegetative Symptoms: None  ADLScreening [facility] Assessment Services) Patient's cognitive ability adequate to safely complete daily activities?: Yes Patient able to express need for assistance with ADLs?: Yes Independently performs ADLs?: Yes (appropriate for developmental age)  Prior Inpatient Therapy Prior Inpatient Therapy: Yes Prior Therapy Dates: 07/2018, 06/2018, 10/2017 & 06/2016 Prior Therapy Facilty/Provider(s): [facility] Reason for Treatment: Bipolar  Prior Outpatient Therapy Prior Outpatient Therapy: No Does patient have an ACCT team?: No Does patient have Intensive In-House Services?  : No Does patient have [facility] services? : No Does patient have P4CC services?: No  ADL Screening (condition at time of admission) Patient's cognitive ability adequate to safely complete daily activities?: Yes Is the patient deaf or have difficulty hearing?: No Does the patient have difficulty seeing, even when wearing glasses/contacts?: No Does the patient have difficulty concentrating, remembering, or making decisions?: No Patient able to express need for assistance with ADLs?: Yes Does the patient have difficulty dressing or bathing?: No Independently performs ADLs?: Yes (appropriate for developmental age) Does the patient have difficulty walking or climbing stairs?: No Weakness of Legs: None Weakness of Arms/Hands: None  Home Assistive Devices/Equipment Home Assistive Devices/Equipment: None  Therapy Consults (therapy consults require a physician order) PT Evaluation Needed: No OT Evalulation Needed: No SLP Evaluation Needed: No Abuse/Neglect Assessment (Assessment to be complete while patient is alone) Abuse/Neglect Assessment Can Be Completed: Yes Physical Abuse: Denies Verbal Abuse: Denies Sexual Abuse: Denies Exploitation of patient/patient's resources: Denies Self-Neglect: Denies Values / Beliefs Cultural Requests During Hospitalization: None Spiritual Requests  During  Hospitalization: None Consults Spiritual Care Consult Needed: No Transition of Care Team Consult Needed: No Advance Directives (For Healthcare) Does Patient Have a Medical Advance Directive?: No Would patient like information on creating a medical advance directive?: No - Patient declined Nutrition Screen- [place] Adult/[place]/AP Patient's home diet: Regular  Child/Adolescent Assessment Running Away Risk: Denies(Patient is an adult)  Disposition:  Disposition Initial Assessment Completed for this Encounter: Yes  On Site Evaluation by:   Reviewed with Physician:    Gunnar Fusi MS, LCAS, [facility], [place] Therapeutic Triage Specialist 06/02/2019 6:16 PM

## 2019-06-02 NOTE — ED Triage Notes (Signed)
Patient ambulatory to triage with steady gait, without difficulty or distress noted, mask in place; pt brought in by [facility] PD voluntarily due to depression; st "I've been depressed most of my lost"; pt admits to SI but st "only reason I don't is because I'm a coward and too afraid of pain"

## 2019-06-02 NOTE — ED Notes (Signed)
Says she has been depressed.  Last few weeks worse.  Says my life is in the toilet.

## 2019-06-02 NOTE — Consult Note (Signed)
[facility] Consult   Reason for Consult:  Major depressive disorder and suicidal ideation Referring Physician:  Monks Patient Identification: [name] MRN:  [id number] Principal Diagnosis: <principal problem not specified> Diagnosis:  Active Problems:   * No active hospital problems. *   Total Time spent with patient: 45 minutes  Subjective:   [name] is a 74 y.o. female patient admitted with major depressive disorder and suicidal ideation.  HPI: [name] is a 74 y.o. female who presented to the emergency department last night via local police with depression and suicidal ideation. She states that she has a history of MDD and that lately due to the pandemic she has been feeling increasingly lonely and depressed. She was having thoughts of killing herself last night and called the police to bring her to the emergency department for evaluation. She has a past medical history of major depressive disorder managed with medications and says that she has had this for "years." She reports multiple inpatient psychiatric hospitalizations and 2 prior suicide attempts. Upon speaking with her this morning, she says she is no longer suicidal and wishes to seek outpatient treatment for her depression including therapy and resources to be more active in the community. She is currently a patient at [facility] and says that she will see them on an outpatient basis.  She denies any manic symptoms including audiovisual hallucinations.  She adamantly denies suicidal ideation and reports feeling safe returning home.  Past Psychiatric History: Lengthy history of major depressive disorder managed with multiple medications. Reports history of 2 prior suicide attempts, the most recent over 10 years ago and a history of several hospitalizations for her major depressive disorder. She was last hospitalized in March 2020 at [facility] on the inpatient psychiatry unit.  Currently compliant  with medications and with outpatient appointments.  Risk to Self:  No Risk to Others:  No Prior Inpatient Therapy:  Yes Prior Outpatient Therapy:  Yes  Past Medical History:  Past Medical History:  Diagnosis Date  . Arthritis   . Breast cancer ([place])   . Cancer [facility]) 2004   right breast ca  . Diabetes mellitus without complication ([place])   . Hypercholesterolemia   . Hypertension   . Hypothyroid   . Seasonal allergies     Past Surgical History:  Procedure Laterality Date  . ABDOMINAL HYSTERECTOMY    . BREAST BIOPSY Left    neg  . BREAST EXCISIONAL BIOPSY Right 2004   breast ca and lumpectomy  . BREAST LUMPECTOMY Right 2004   breast ca  . CHOLECYSTECTOMY    . COLONOSCOPY WITH PROPOFOL N/A 02/13/2017   Procedure: COLONOSCOPY WITH PROPOFOL;  Surgeon: Jonathon Bellows, MD;  Location: [facility] END[facility]COPY;  Service: Gastroenterology;  Laterality: N/A;  . KIDNEY STONE SURGERY     Family History:  Family History  Problem Relation Age of Onset  . Bladder Cancer Neg Hx   . Kidney cancer Neg Hx    Family Psychiatric  History: Unknown Social History:  Social History   Substance and Sexual Activity  Alcohol Use No     Social History   Substance and Sexual Activity  Drug Use No    Social History   Socioeconomic History  . Marital status: Divorced    Spouse name: Not on file  . Number of children: Not on file  . Years of education: Not on file  . Highest education level: Not on file  Occupational History  . Not on file  Tobacco Use  . Smoking status: Former Smoker    Types: Cigarettes  . Smokeless tobacco: Never Used  Substance and Sexual Activity  . Alcohol use: No  . Drug use: No  . Sexual activity: Not Currently  Other Topics Concern  . Not on file  Social History Narrative  . Not on file   Social Determinants of Health   Financial Resource Strain:   . Difficulty of Paying Living Expenses: Not on file  Food Insecurity:   . Worried About Charity fundraiser in the  Last Year: Not on file  . Ran Out of Food in the Last Year: Not on file  Transportation Needs:   . Lack of Transportation (Medical): Not on file  . Lack of Transportation (Non-Medical): Not on file  Physical Activity:   . Days of Exercise per Week: Not on file  . Minutes of Exercise per Session: Not on file  Stress:   . Feeling of Stress : Not on file  Social Connections:   . Frequency of Communication with Friends and Family: Not on file  . Frequency of Social Gatherings with Friends and Family: Not on file  . Attends Religious Services: Not on file  . Active Member of Clubs or Organizations: Not on file  . Attends Archivist Meetings: Not on file  . Marital Status: Not on file   Additional Social History: Denies any drugs or substance use.  Reports living at home.  Estranged from both her children as well as from her siblings.    Allergies:   Allergies  Allergen Reactions  . Navane [Thiothixene] Other (See Comments)    Reaction:  Unknown  . Penicillins Rash    Has patient had a PCN reaction causing immediate rash, facial/tongue/throat swelling, SOB or lightheadedness with hypotension: No Has patient had a PCN reaction causing severe rash involving mucus membranes or skin necrosis: No Has patient had a PCN reaction that required hospitalization: No Has patient had a PCN reaction occurring within the last 10 years: No If all of the above answers are "NO", then may proceed with Cephalosporin use.     Labs:  Results for orders placed or performed during the hospital encounter of 06/02/19 (from the past 48 hour(s))  Urine Drug Screen, Qualitative     Status: None   Collection Time: 06/02/19  6:13 AM  Result Value Ref Range   Tricyclic, Ur Screen NONE DETECTED NONE DETECTED   Amphetamines, Ur Screen NONE DETECTED NONE DETECTED   MDMA (Ecstasy)Ur Screen NONE DETECTED NONE DETECTED   Cocaine Metabolite,Ur [place] NONE DETECTED NONE DETECTED   Opiate, Ur Screen NONE DETECTED  NONE DETECTED   Phencyclidine (PCP) Ur S NONE DETECTED NONE DETECTED   Cannabinoid 50 Ng, Ur [place] NONE DETECTED NONE DETECTED   Barbiturates, Ur Screen NONE DETECTED NONE DETECTED   Benzodiazepine, Ur Scrn NONE DETECTED NONE DETECTED   Methadone Scn, Ur NONE DETECTED NONE DETECTED    Comment: (NOTE) Tricyclics + metabolites, urine    Cutoff 1000 ng/mL Amphetamines + metabolites, urine  Cutoff 1000 ng/mL MDMA (Ecstasy), urine              Cutoff 500 ng/mL Cocaine Metabolite, urine          Cutoff 300 ng/mL Opiate + metabolites, urine        Cutoff 300 ng/mL Phencyclidine (PCP), urine         Cutoff 25 ng/mL Cannabinoid, urine  Cutoff 50 ng/mL Barbiturates + metabolites, urine  Cutoff 200 ng/mL Benzodiazepine, urine              Cutoff 200 ng/mL Methadone, urine                   Cutoff 300 ng/mL The urine drug screen provides only a preliminary, unconfirmed analytical test result and should not be used for non-medical purposes. Clinical consideration and professional judgment should be applied to any positive drug screen result due to possible interfering substances. A more specific alternate chemical method must be used in order to obtain a confirmed analytical result. Gas chromatography / mass spectrometry (GC/MS) is the preferred confirmat ory method. Performed at [facility], [address] [place], [place] [address]   Comprehensive metabolic panel     Status: Abnormal   Collection Time: 06/02/19  6:16 AM  Result Value Ref Range   Sodium 140 135 - 145 mmol/L   Potassium 4.1 3.5 - 5.1 mmol/L   Chloride 104 98 - 111 mmol/L   CO2 27 22 - 32 mmol/L   Glucose, Bld 108 (H) 70 - 99 mg/dL   BUN 20 8 - 23 mg/dL   Creatinine, Ser 1.40 (H) 0.44 - 1.00 mg/dL   Calcium 9.5 8.9 - 10.3 mg/dL   Total Protein 7.4 6.5 - 8.1 g/dL   Albumin 3.8 3.5 - 5.0 g/dL   AST 21 15 - 41 U/L   ALT 12 0 - 44 U/L   Alkaline Phosphatase 38 38 - 126 U/L   Total Bilirubin 0.6 0.3 -  1.2 mg/dL   GFR calc non Af Amer 37 (L) >60 mL/min   GFR calc Af Amer 43 (L) >60 mL/min   Anion gap 9 5 - 15    Comment: Performed at [facility], [address], [place], [place] [address]  Ethanol     Status: None   Collection Time: 06/02/19  6:16 AM  Result Value Ref Range   Alcohol, Ethyl (B) <10 <10 mg/dL    Comment: (NOTE) Lowest detectable limit for serum alcohol is 10 mg/dL. For medical purposes only. Performed at [facility], [address] [place], [place] [id number]   Salicylate level     Status: Abnormal   Collection Time: 06/02/19  6:16 AM  Result Value Ref Range   Salicylate Lvl Q000111Q (L) 7.0 - 30.0 mg/dL    Comment: Performed at [facility], [address] [place], [place] [address]  Acetaminophen level     Status: Abnormal   Collection Time: 06/02/19  6:16 AM  Result Value Ref Range   Acetaminophen (Tylenol), Serum <10 (L) 10 - 30 ug/mL    Comment: (NOTE) Therapeutic concentrations vary significantly. A range of 10-30 ug/mL  may be an effective concentration for many patients. However, some  are best treated at concentrations outside of this range. Acetaminophen concentrations >150 ug/mL at 4 hours after ingestion  and >50 ug/mL at 12 hours after ingestion are often associated with  toxic reactions. Performed at [facility], [address] [place], [place] [address]   cbc     Status: Abnormal   Collection Time: 06/02/19  6:16 AM  Result Value Ref Range   WBC 6.7 4.0 - 10.5 K/uL   RBC 5.45 (H) 3.87 - 5.11 MIL/uL   Hemoglobin 15.6 (H) 12.0 - 15.0 g/dL   HCT 47.9 (H) 36.0 - 46.0 %   MCV 87.9 80.0 - 100.0 fL  MCH 28.6 26.0 - 34.0 pg   MCHC 32.6 30.0 - 36.0 g/dL   RDW 12.6 11.5 - 15.5 %   Platelets 203 150 - 400 K/uL   nRBC 0.0 0.0 - 0.2 %    Comment: Performed at [facility], [address] [place], [place] [address]  TSH     Status: None   Collection Time: 06/02/19  6:16 AM  Result Value Ref Range    TSH 2.428 0.350 - 4.500 uIU/mL    Comment: Performed by a 3rd Generation assay with a functional sensitivity of <=0.01 uIU/mL. Performed at [facility], [address] [place], [place] [address]   T4, free     Status: None   Collection Time: 06/02/19  6:16 AM  Result Value Ref Range   Free T4 1.05 0.61 - 1.12 ng/dL    Comment: (NOTE) Biotin ingestion may interfere with free T4 tests. If the results are inconsistent with the TSH level, previous test results, or the clinical presentation, then consider biotin interference. If needed, order repeat testing after stopping biotin. Performed at [facility], [address] [place], [place] [address]     No current facility-administered medications for this encounter.   Current Outpatient Medications  Medication Sig Dispense Refill  . clonazePAM (KLONOPIN) 1 MG tablet Take 1 tablet (1 mg total) by mouth at bedtime. 30 tablet 1  . divalproex (DEPAKOTE ER) 500 MG 24 hr tablet Take 2 tablets (1,000 mg total) by mouth at bedtime. 60 tablet 1  . enalapril (VASOTEC) 20 MG tablet Take 1 tablet (20 mg total) by mouth 2 (two) times daily. 60 tablet 1  . FLUoxetine (PROZAC) 40 MG capsule Take 1 capsule (40 mg total) by mouth daily. 30 capsule 1  . JANUVIA 100 MG tablet Take 100 mg by mouth daily.     Marland Kitchen levothyroxine (SYNTHROID) 88 MCG tablet Take 88 mcg by mouth daily before breakfast.     . loratadine (CLARITIN) 10 MG tablet Take 1 tablet (10 mg total) by mouth daily. 30 tablet 1  . metoprolol succinate (TOPROL-XL) 50 MG 24 hr tablet Take 50 mg by mouth daily.    Marland Kitchen PAZEO 0.7 % SOLN     . rosuvastatin (CRESTOR) 40 MG tablet Take 40 mg by mouth daily.    . traZODone (DESYREL) 100 MG tablet Take 1 tablet (100 mg total) by mouth at bedtime. 30 tablet 1  . simvastatin (ZOCOR) 20 MG tablet Take 1 tablet (20 mg total) by mouth daily. (Patient not taking: Reported on 06/02/2019) 30 tablet 1    Musculoskeletal: Strength & Muscle  Tone: within normal limits Gait & Station: normal Patient leans: N/A  Psychiatric Specialty Exam: Physical Exam  Review of Systems  HENT: Negative for congestion.   Eyes: Negative for discharge.  Respiratory: Negative for cough.   Genitourinary: Negative for flank pain.  Neurological: Negative for speech difficulty.  Psychiatric/Behavioral: Positive for dysphoric mood and suicidal ideas. Negative for agitation, behavioral problems, confusion, decreased concentration, hallucinations, self-injury and sleep disturbance. The patient is nervous/anxious. The patient is not hyperactive.     Blood pressure 126/74, pulse (!) 55, temperature 98.2 F (36.8 C), temperature source Oral, resp. rate 20, height 5\' 1"  (1.549 m), weight 78.5 kg, SpO2 97 %.Body mass index is 32.69 kg/m.  General Appearance: Fairly Groomed  Eye Contact:  Good  Speech:  Normal Rate  Volume:  Normal  Mood:  Negative  Affect:  Appropriate and Full Range  Thought  Process:  Coherent and Goal Directed  Orientation:  Full (Time, Place, and Person)  Thought Content:  Logical  Suicidal Thoughts:  No  Homicidal Thoughts:  No  Memory:  Recent;   Fair  Judgement:  Fair  Insight:  Fair  Psychomotor Activity:  Normal  Concentration:  Concentration: Good  Recall:  [place] of Knowledge:  Good  Language:  Good  Akathisia:  No  Handed:  Right  AIMS (if indicated):     Assets:  Communication Skills Desire for Improvement Financial Resources/Insurance Housing Leisure Time Resilience Talents/Skills  ADL's:  Intact  Cognition:  WNL  Sleep:        Treatment Plan Summary: Patient is a 74 y.o. woman who presented to the emergency department yesterday with major depression and suicidal ideation. Upon speaking with the patient this morning it was determined that she is not currently having suicidal or homicidal ideation, auditory or visual hallucinations. There was no evidence of psychosis. The patient will speak with her  outpatient provider for therapy and other resources to get her more involved in the community to help with her feelings of loneliness. The patient is not a danger to herself or others at this time and can be discharged home.   Diagnosis: Depression, bipolar disorder  Disposition: No evidence of imminent risk to self or others at present.   Patient does not meet criteria for psychiatric inpatient admission. Supportive therapy provided about ongoing stressors. Discussed crisis plan, support from social network, calling 911, coming to the Emergency Department, and calling Suicide Hotline.  Dixie Dials, MD 06/02/2019 11:47 AM

## 2019-06-03 ENCOUNTER — Other Ambulatory Visit: Payer: Self-pay | Admitting: Internal Medicine

## 2019-06-03 DIAGNOSIS — Z1231 Encounter for screening mammogram for malignant neoplasm of breast: Secondary | ICD-10-CM

## 2019-06-22 ENCOUNTER — Emergency Department
Admission: EM | Admit: 2019-06-22 | Discharge: 2019-06-24 | Disposition: A | Discharge status: Other Acute Inpatient Hospital | Payer: Medicare HMO | Attending: Student in an Organized Health Care Education/Training Program | Admitting: Student in an Organized Health Care Education/Training Program

## 2019-06-22 ENCOUNTER — Encounter: Payer: Self-pay | Admitting: Emergency Medicine

## 2019-06-22 DIAGNOSIS — F4310 Post-traumatic stress disorder, unspecified: Secondary | ICD-10-CM | POA: Diagnosis present

## 2019-06-22 DIAGNOSIS — E1169 Type 2 diabetes mellitus with other specified complication: Secondary | ICD-10-CM

## 2019-06-22 DIAGNOSIS — F3163 Bipolar disorder, current episode mixed, severe, without psychotic features: Secondary | ICD-10-CM | POA: Diagnosis present

## 2019-06-22 DIAGNOSIS — F3113 Bipolar disorder, current episode manic without psychotic features, severe: Secondary | ICD-10-CM | POA: Diagnosis present

## 2019-06-22 DIAGNOSIS — M79674 Pain in right toe(s): Secondary | ICD-10-CM | POA: Diagnosis present

## 2019-06-22 DIAGNOSIS — R45851 Suicidal ideations: Secondary | ICD-10-CM | POA: Diagnosis not present

## 2019-06-22 DIAGNOSIS — Z20822 Contact with and (suspected) exposure to covid-19: Secondary | ICD-10-CM | POA: Diagnosis not present

## 2019-06-22 DIAGNOSIS — Z7984 Long term (current) use of oral hypoglycemic drugs: Secondary | ICD-10-CM | POA: Diagnosis not present

## 2019-06-22 DIAGNOSIS — Z79899 Other long term (current) drug therapy: Secondary | ICD-10-CM | POA: Diagnosis not present

## 2019-06-22 DIAGNOSIS — F319 Bipolar disorder, unspecified: Secondary | ICD-10-CM | POA: Diagnosis not present

## 2019-06-22 DIAGNOSIS — I10 Essential (primary) hypertension: Secondary | ICD-10-CM | POA: Diagnosis present

## 2019-06-22 DIAGNOSIS — F314 Bipolar disorder, current episode depressed, severe, without psychotic features: Secondary | ICD-10-CM | POA: Diagnosis present

## 2019-06-22 DIAGNOSIS — Z87891 Personal history of nicotine dependence: Secondary | ICD-10-CM | POA: Diagnosis not present

## 2019-06-22 DIAGNOSIS — E785 Hyperlipidemia, unspecified: Secondary | ICD-10-CM | POA: Diagnosis present

## 2019-06-22 DIAGNOSIS — E119 Type 2 diabetes mellitus without complications: Secondary | ICD-10-CM | POA: Diagnosis not present

## 2019-06-22 DIAGNOSIS — B351 Tinea unguium: Secondary | ICD-10-CM | POA: Diagnosis present

## 2019-06-22 DIAGNOSIS — F4325 Adjustment disorder with mixed disturbance of emotions and conduct: Secondary | ICD-10-CM | POA: Diagnosis present

## 2019-06-22 DIAGNOSIS — F3112 Bipolar disorder, current episode manic without psychotic features, moderate: Secondary | ICD-10-CM | POA: Diagnosis present

## 2019-06-22 DIAGNOSIS — Z046 Encounter for general psychiatric examination, requested by authority: Secondary | ICD-10-CM | POA: Diagnosis present

## 2019-06-22 DIAGNOSIS — E039 Hypothyroidism, unspecified: Secondary | ICD-10-CM | POA: Diagnosis present

## 2019-06-22 LAB — CBC
HCT: 48.4 % — ABNORMAL HIGH (ref 36.0–46.0)
Hemoglobin: 15.6 g/dL — ABNORMAL HIGH (ref 12.0–15.0)
MCH: 28.5 pg (ref 26.0–34.0)
MCHC: 32.2 g/dL (ref 30.0–36.0)
MCV: 88.3 fL (ref 80.0–100.0)
Platelets: 192 10*3/uL (ref 150–400)
RBC: 5.48 MIL/uL — ABNORMAL HIGH (ref 3.87–5.11)
RDW: 12.7 % (ref 11.5–15.5)
WBC: 9.1 10*3/uL (ref 4.0–10.5)
nRBC: 0 % (ref 0.0–0.2)

## 2019-06-22 LAB — URINE DRUG SCREEN, QUALITATIVE (ARMC ONLY)
Amphetamines, Ur Screen: NOT DETECTED
Barbiturates, Ur Screen: NOT DETECTED
Benzodiazepine, Ur Scrn: NOT DETECTED
Cannabinoid 50 Ng, Ur ~~LOC~~: NOT DETECTED
Cocaine Metabolite,Ur ~~LOC~~: NOT DETECTED
MDMA (Ecstasy)Ur Screen: NOT DETECTED
Methadone Scn, Ur: NOT DETECTED
Opiate, Ur Screen: NOT DETECTED
Phencyclidine (PCP) Ur S: NOT DETECTED
Tricyclic, Ur Screen: NOT DETECTED

## 2019-06-22 LAB — TSH: TSH: 1.939 u[IU]/mL (ref 0.350–4.500)

## 2019-06-22 LAB — SALICYLATE LEVEL: Salicylate Lvl: <7 mg/dL — ABNORMAL LOW (ref 7.0–30.0)

## 2019-06-22 LAB — COMPREHENSIVE METABOLIC PANEL
ALT: 14 U/L (ref 0–44)
AST: 15 U/L (ref 15–41)
Albumin: 4 g/dL (ref 3.5–5.0)
Alkaline Phosphatase: 57 U/L (ref 38–126)
Anion gap: 6 (ref 5–15)
BUN: 27 mg/dL — ABNORMAL HIGH (ref 8–23)
CO2: 29 mmol/L (ref 22–32)
Calcium: 9.4 mg/dL (ref 8.9–10.3)
Chloride: 98 mmol/L (ref 98–111)
Creatinine, Ser: 1.47 mg/dL — ABNORMAL HIGH (ref 0.44–1.00)
GFR calc Af Amer: 40 mL/min — ABNORMAL LOW (ref >60)
GFR calc non Af Amer: 35 mL/min — ABNORMAL LOW (ref >60)
Glucose, Bld: 323 mg/dL — ABNORMAL HIGH (ref 70–99)
Potassium: 4.8 mmol/L (ref 3.5–5.1)
Sodium: 133 mmol/L — ABNORMAL LOW (ref 135–145)
Total Bilirubin: 0.5 mg/dL (ref 0.3–1.2)
Total Protein: 7.6 g/dL (ref 6.5–8.1)

## 2019-06-22 LAB — ETHANOL: Alcohol, Ethyl (B): <10 mg/dL (ref <10)

## 2019-06-22 LAB — ACETAMINOPHEN LEVEL: Acetaminophen (Tylenol), Serum: <10 ug/mL — ABNORMAL LOW (ref 10–30)

## 2019-06-22 MED ORDER — FLUOXETINE HCL 20 MG PO CAPS
40.0000 mg | ORAL_CAPSULE | Freq: Every day | ORAL | Status: DC
  Administered 2019-06-23 – 2019-06-24 (×2): 40 mg via ORAL
  Filled 2019-06-22 (×2): qty 2

## 2019-06-22 MED ORDER — DIVALPROEX SODIUM ER 500 MG PO TB24
1000.0000 mg | ORAL_TABLET | Freq: Every day | ORAL | Status: DC
  Administered 2019-06-23 – 2019-06-24 (×2): 1000 mg via ORAL
  Filled 2019-06-22: qty 2
  Filled 2019-06-22: qty 4

## 2019-06-22 MED ORDER — ENALAPRIL MALEATE 20 MG PO TABS
20.0000 mg | ORAL_TABLET | Freq: Two times a day (BID) | ORAL | Status: DC
  Administered 2019-06-22 – 2019-06-23 (×3): 20 mg via ORAL
  Filled 2019-06-22: qty 1
  Filled 2019-06-22 (×4): qty 2

## 2019-06-22 MED ORDER — CLONAZEPAM 1 MG PO TABS
1.0000 mg | ORAL_TABLET | Freq: Every day | ORAL | Status: DC
  Administered 2019-06-22 – 2019-06-24 (×3): 1 mg via ORAL
  Filled 2019-06-22: qty 1
  Filled 2019-06-22 (×2): qty 2

## 2019-06-22 MED ORDER — LEVOTHYROXINE SODIUM 88 MCG PO TABS
88.0000 ug | ORAL_TABLET | Freq: Every day | ORAL | Status: DC
  Administered 2019-06-23 – 2019-06-24 (×2): 88 ug via ORAL
  Filled 2019-06-22 (×3): qty 1

## 2019-06-22 MED ORDER — FLUOXETINE HCL 40 MG PO CAPS: 40.0000 mg | ORAL_CAPSULE | Freq: Every day | ORAL | Status: DC

## 2019-06-22 NOTE — ED Provider Notes (Signed)
[facility] Emergency Department Provider Note    First MD Initiated Contact with Patient 06/22/19 2110     (approximate)  I have reviewed the triage vital signs and the nursing notes.   HISTORY  Chief Complaint Mental Health Problem    HPI [name] is a 74 y.o. female [place] past medical history presents from [place] due to suicidal ideation stating she plans to end it all.  Endorsing severe anhedonia.  Feels very lonely and no longer has the will to live.  Sent over to the ER for psychiatric admission by [place].  She is denying any sort of plan to me right now.  States that she is on Klonopin nightly.  Feels very anxious right now.    Past Medical History:  Diagnosis Date  . Arthritis   . Breast cancer ([place])   . Cancer [facility]) 2004   right breast ca  . Diabetes mellitus without complication ([place])   . Hypercholesterolemia   . Hypertension   . Hypothyroid   . Seasonal allergies    Family History  Problem Relation Age of Onset  . Bladder Cancer Neg Hx   . Kidney cancer Neg Hx    Past Surgical History:  Procedure Laterality Date  . ABDOMINAL HYSTERECTOMY    . BREAST BIOPSY Left    neg  . BREAST EXCISIONAL BIOPSY Right 2004   breast ca and lumpectomy  . BREAST LUMPECTOMY Right 2004   breast ca  . CHOLECYSTECTOMY    . COLONOSCOPY WITH PROPOFOL N/A 02/13/2017   Procedure: COLONOSCOPY WITH PROPOFOL;  Surgeon: Jonathon Bellows, MD;  Location: [facility] ENDOSCOPY;  Service: Gastroenterology;  Laterality: N/A;  . KIDNEY STONE SURGERY     Patient Active Problem List   Diagnosis Date Noted  . Pain due to onychomycosis of toenails of both feet 11/23/2018  . Bipolar 1 disorder, manic, moderate ([place]) 07/21/2018  . Bipolar 1 disorder, mixed, severe ([place]) 07/21/2018  . Bipolar 1 disorder with moderate mania ([place]) 07/20/2018  . Bipolar I disorder, most recent episode depressed with anxious distress ([place]) 07/07/2017  . PTSD (post-traumatic stress disorder)  07/07/2017  . 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 Diabetes ([place]) 09/23/2014  . Essential hypertension 09/21/2014  . Hypothyroidism 09/21/2014  . Dyslipidemia 09/21/2014  . Bipolar I disorder, current or most recent episode manic, severe with mixed features ([place]) 09/20/2014      Prior to Admission medications   Medication Sig Start Date End Date Taking? Authorizing Provider  clonazePAM (KLONOPIN) 1 MG tablet Take 1 tablet (1 mg total) by mouth at bedtime. 07/27/18   Clapacs, Madie Reno, MD  divalproex (DEPAKOTE ER) 500 MG 24 hr tablet Take 2 tablets (1,000 mg total) by mouth at bedtime. 07/27/18   Clapacs, Madie Reno, MD  enalapril (VASOTEC) 20 MG tablet Take 1 tablet (20 mg total) by mouth 2 (two) times daily. 07/27/18   Clapacs, Madie Reno, MD  FLUoxetine (PROZAC) 40 MG capsule Take 1 capsule (40 mg total) by mouth daily. 07/28/18   Clapacs, Madie Reno, MD  JANUVIA 100 MG tablet Take 100 mg by mouth daily.  11/11/18   [provider]  levothyroxine (SYNTHROID) 88 MCG tablet Take 88 mcg by mouth daily before breakfast.  11/09/18   [provider]  loratadine (CLARITIN) 10 MG tablet Take 1 tablet (10 mg total) by mouth daily. 07/27/18   Clapacs, Madie Reno, MD  metoprolol succinate (TOPROL-XL) 50 MG 24 hr tablet  Take 50 mg by mouth daily. 05/12/19   [provider]  PAZEO 0.7 % SOLN  08/25/18   [provider]  rosuvastatin (CRESTOR) 40 MG tablet Take 40 mg by mouth daily. 05/12/19   [provider]  simvastatin (ZOCOR) 20 MG tablet Take 1 tablet (20 mg total) by mouth daily. Patient not taking: Reported on 06/02/2019 07/27/18   Clapacs, Madie Reno, MD  traZODone (DESYREL) 100 MG tablet Take 1 tablet (100 mg total) by mouth at bedtime. 07/27/18   Clapacs, Madie Reno, MD    Allergies Navane [thiothixene] and Penicillins    Social History Social History   Tobacco Use  . Smoking status: Former Smoker    Types: Cigarettes  . Smokeless  tobacco: Never Used  Substance Use Topics  . Alcohol use: No  . Drug use: No    Review of Systems Patient denies headaches, rhinorrhea, blurry vision, numbness, shortness of breath, chest pain, edema, cough, abdominal pain, nausea, vomiting, diarrhea, dysuria, fevers, rashes or hallucinations unless otherwise stated above in HPI. ____________________________________________   PHYSICAL EXAM:  VITAL SIGNS: Vitals:   06/22/19 2054  BP: 118/90  Pulse: 64  Resp: 17  Temp: 98 F (36.7 C)  SpO2: 99%    Constitutional: Alert and oriented.  Eyes: Conjunctivae are normal.  Head: Atraumatic. Nose: No congestion/rhinnorhea. Mouth/Throat: Mucous membranes are moist.   Neck: No stridor. Painless ROM.  Cardiovascular: Normal rate, regular rhythm. Grossly normal heart sounds.  Good peripheral circulation. Respiratory: Normal respiratory effort.  No retractions. Lungs CTAB. Gastrointestinal: Soft and nontender. No distention. No abdominal bruits. No CVA tenderness. Genitourinary:  Musculoskeletal: No lower extremity tenderness nor edema.  No joint effusions. Neurologic:  Normal speech and language. No gross focal neurologic deficits are appreciated. No facial droop Skin:  Skin is warm, dry and intact. No rash noted. Psychiatric: Mood and affect are anxious. Speech and behavior are normal.  ____________________________________________   LABS (all labs ordered are listed, but only abnormal results are displayed)  Results for orders placed or performed during the hospital encounter of 06/22/19 (from the past 24 hour(s))  Comprehensive metabolic panel     Status: Abnormal   Collection Time: 06/22/19  8:55 PM  Result Value Ref Range   Sodium 133 (L) 135 - 145 mmol/L   Potassium 4.8 3.5 - 5.1 mmol/L   Chloride 98 98 - 111 mmol/L   CO2 29 22 - 32 mmol/L   Glucose, Bld 323 (H) 70 - 99 mg/dL   BUN 27 (H) 8 - 23 mg/dL   Creatinine, Ser 1.47 (H) 0.44 - 1.00 mg/dL   Calcium 9.4 8.9 - 10.3  mg/dL   Total Protein 7.6 6.5 - 8.1 g/dL   Albumin 4.0 3.5 - 5.0 g/dL   AST 15 15 - 41 U/L   ALT 14 0 - 44 U/L   Alkaline Phosphatase 57 38 - 126 U/L   Total Bilirubin 0.5 0.3 - 1.2 mg/dL   GFR calc non Af Amer 35 (L) >60 mL/min   GFR calc Af Amer 40 (L) >60 mL/min   Anion gap 6 5 - 15  Ethanol     Status: None   Collection Time: 06/22/19  8:55 PM  Result Value Ref Range   Alcohol, Ethyl (B) Q000111Q Q000111Q mg/dL  Salicylate level     Status: Abnormal   Collection Time: 06/22/19  8:55 PM  Result Value Ref Range   Salicylate Lvl Q000111Q (L) 7.0 - 30.0 mg/dL  Acetaminophen level  Status: Abnormal   Collection Time: 06/22/19  8:55 PM  Result Value Ref Range   Acetaminophen (Tylenol), Serum <10 (L) 10 - 30 ug/mL  cbc     Status: Abnormal   Collection Time: 06/22/19  8:55 PM  Result Value Ref Range   WBC 9.1 4.0 - 10.5 K/uL   RBC 5.48 (H) 3.87 - 5.11 MIL/uL   Hemoglobin 15.6 (H) 12.0 - 15.0 g/dL   HCT 48.4 (H) 36.0 - 46.0 %   MCV 88.3 80.0 - 100.0 fL   MCH 28.5 26.0 - 34.0 pg   MCHC 32.2 30.0 - 36.0 g/dL   RDW 12.7 11.5 - 15.5 %   Platelets 192 150 - 400 K/uL   nRBC 0.0 0.0 - 0.2 %   ____________________________________________  EKG My review and personal interpretation at Time: 22:17   Indication: medication screening  Rate: 55  Rhythm: sinus Axis: normal Other: normal intervals, no stemi ____________________________________________  RADIOLOGY   ____________________________________________   PROCEDURES  Procedure(s) performed:  Procedures    Critical Care performed: no ____________________________________________   INITIAL IMPRESSION / ASSESSMENT AND PLAN / ED COURSE  Pertinent labs & imaging results that were available during my care of the patient were reviewed by me and considered in my medical decision making (see chart for details).   DDX: Psychosis, delirium, medication effect, noncompliance, polysubstance abuse, Si, Hi, depression   [name] is a 74 y.o. who presents to the ED with for evaluation of SI.  Patient has psych history of depression and anxiety.  Laboratory testing was ordered to evaluation for underlying electrolyte derangement or signs of underlying organic pathology to explain today's presentation.  Based on history and physical and laboratory evaluation, it appears that the patient's presentation is 2/2 underlying psychiatric disorder and will require further evaluation and management by inpatient psychiatry.  Patient was  made an IVC due to SI.  Disposition pending psychiatric evaluation.   Clinical Course as of Jun 21 2216  Tue Jun 22, 2019  2215 nRBC: 0.0 [PR]    Clinical Course User Index [PR] Merlyn Lot, MD    The patient was evaluated in Emergency Department today for the symptoms described in the history of present illness. He/she was evaluated in the context of the global COVID-19 pandemic, which necessitated consideration that the patient might be at risk for infection with the SARS-CoV-2 virus that causes COVID-19. Institutional protocols and algorithms that pertain to the evaluation of patients at risk for COVID-19 are in a state of rapid change based on information released by regulatory bodies including the CDC and federal and state organizations. These policies and algorithms were followed during the patient's care in the ED.  As part of my medical decision making, I reviewed the following data within the [place] notes reviewed and incorporated, Labs reviewed, notes from prior ED visits and [place] Controlled Substance Database   ____________________________________________   FINAL CLINICAL IMPRESSION(S) / ED DIAGNOSES  Final diagnoses:  Suicidal ideation      NEW MEDICATIONS STARTED DURING THIS VISIT:  New Prescriptions   No medications on file     Note:  This document was prepared using Dragon voice recognition software and may include unintentional  dictation errors.    Merlyn Lot, MD 06/22/19 2218

## 2019-06-22 NOTE — ED Notes (Addendum)
Pt talking to tele TTS at this time. Pt remains calm and cooperative at this time.   Meal tray was provided for dinner pt denies needing more food or drink at this time.

## 2019-06-22 NOTE — ED Notes (Signed)
Psych NP at bedside

## 2019-06-22 NOTE — BH Assessment (Signed)
Assessment Note  [name] is an 74 y.o. female. Who presents with a history as listed below. Pt also self reports a history of Bipolar Disorder. She reports medication and treatment compliance. Pt arrived via [facility], under IVC. Per affidavit, pt has nothing to live for anymore. Pt has no job, or support from friends or family. Pt went to [facility] today where she communicated thoughts of harming herself. Pt states that she recently was shown pictures of her estranged children and that she has experienced decrease motivation,excessive sleep and hopelessness since that time (X3 months). She shares that the pandemic has intensified these sx. Pt. denies any suicidal ideation, plan or intent at this time but admits to intermittent suicidal thoughts. Pt. denies the presence of any auditory or visual hallucinations at this time. Writer is concerned for Pts safety and the safety of others. Pt  cannot contract for safety outside of the hospital. Pt requires inpatient psychiatric hospitalization for safety common stabilization, and medication management.    Diagnosis: Bipolar Disorder   Past Medical History:  Past Medical History:  Diagnosis Date  . Arthritis   . Breast cancer ([place])   . Cancer [facility]) 2004   right breast ca  . Diabetes mellitus without complication ([place])   . Hypercholesterolemia   . Hypertension   . Hypothyroid   . Seasonal allergies     Past Surgical History:  Procedure Laterality Date  . ABDOMINAL HYSTERECTOMY    . BREAST BIOPSY Left    neg  . BREAST EXCISIONAL BIOPSY Right 2004   breast ca and lumpectomy  . BREAST LUMPECTOMY Right 2004   breast ca  . CHOLECYSTECTOMY    . COLONOSCOPY WITH PROPOFOL N/A 02/13/2017   Procedure: COLONOSCOPY WITH PROPOFOL;  Surgeon: Jonathon Bellows, MD;  Location: [facility] ENDOSCOPY;  Service: Gastroenterology;  Laterality: N/A;  . KIDNEY STONE SURGERY      Family History:  Family History  Problem Relation Age of Onset  . Bladder Cancer Neg Hx    . Kidney cancer Neg Hx     Social History:  reports that she has quit smoking. Her smoking use included cigarettes. She has never used smokeless tobacco. She reports that she does not drink alcohol or use drugs.  Additional Social History:  Alcohol / Drug Use Pain Medications: See PTA Prescriptions: See PTA Over the Counter: See PTA History of alcohol / drug use?: No history of alcohol / drug abuse  CIWA: CIWA-Ar BP: 118/90 Pulse Rate: 64 COWS:    Allergies:  Allergies  Allergen Reactions  . Navane [Thiothixene] Other (See Comments)    Reaction:  Unknown  . Penicillins Rash    Has patient had a PCN reaction causing immediate rash, facial/tongue/throat swelling, SOB or lightheadedness with hypotension: No Has patient had a PCN reaction causing severe rash involving mucus membranes or skin necrosis: No Has patient had a PCN reaction that required hospitalization: No Has patient had a PCN reaction occurring within the last 10 years: No If all of the above answers are "NO", then may proceed with Cephalosporin use.     Home Medications: (Not in a hospital admission)   OB/GYN Status:  No LMP recorded. Patient has had a hysterectomy.  General Assessment Data Location of Assessment: [facility] ED TTS Assessment: In system Is this a Tele or Face-to-Face Assessment?: Tele Assessment Is this an Initial Assessment or a Re-assessment for this encounter?: Initial Assessment Patient Accompanied by:: N/A Language Other than English: No Living Arrangements: Other (Comment) What gender  do you identify as?: Female Marital status: Divorced Pregnancy Status: No Living Arrangements: Alone Can pt return to current living arrangement?: Yes Admission Status: Voluntary Is patient capable of signing voluntary admission?: Yes Referral Source: Self/Family/Friend Insurance type: Medicare     Crisis Care Plan Living Arrangements: Alone Legal Guardian: Other: Name of Psychiatrist: Reports of  none Name of Therapist: Reports of none  Education Status Is patient currently in school?: No Is the patient employed, unemployed or receiving disability?: Unemployed, Receiving disability income  Risk to self with the past 6 months Suicidal Ideation: No-Not Currently/Within Last 6 Months Has patient been a risk to self within the past 6 months prior to admission? : Yes Suicidal Intent: No Has patient had any suicidal intent within the past 6 months prior to admission? : No Is patient at risk for suicide?: Yes Suicidal Plan?: Yes-Currently Present Has patient had any suicidal plan within the past 6 months prior to admission? : Yes What has been your use of drugs/alcohol within the last 12 months?: None Previous Attempts/Gestures: No How many times?: 0 Other Self Harm Risks: none Triggers for Past Attempts: None known Family Suicide History: Unknown Recent stressful life event(s): Other (Comment) Persecutory voices/beliefs?: No Depression: Yes Depression Symptoms: Tearfulness, Loss of interest in usual pleasures, Feeling worthless/self pity, Feeling angry/irritable, Fatigue Substance abuse history and/or treatment for substance abuse?: No Suicide prevention information given to non-admitted patients: Not applicable  Risk to Others within the past 6 months Homicidal Ideation: No Does patient have any lifetime risk of violence toward others beyond the six months prior to admission? : No Thoughts of Harm to Others: No Current Homicidal Intent: No Current Homicidal Plan: No Access to Homicidal Means: No Identified Victim: none History of harm to others?: No Assessment of Violence: None Noted Violent Behavior Description: none Does patient have access to weapons?: No Criminal Charges Pending?: No Does patient have a court date: No Is patient on probation?: No  Psychosis Hallucinations: None noted Delusions: None noted  Mental Status Report Appearance/Hygiene: Unremarkable,  In scrubs Eye Contact: Fair Motor Activity: Freedom of movement Speech: Logical/coherent, Unremarkable Level of Consciousness: Alert Mood: Depressed Affect: Appropriate to circumstance, Sad Anxiety Level: Moderate Thought Processes: Relevant Judgement: Partial Orientation: Person, Place, Time, Situation, Appropriate for developmental age Obsessive Compulsive Thoughts/Behaviors: None  Cognitive Functioning Concentration: Normal Memory: Remote Intact, Recent Intact Is patient IDD: No Insight: Fair Impulse Control: Poor Appetite: Poor Have you had any weight changes? : No Change Sleep: Increased Total Hours of Sleep: 10 Vegetative Symptoms: None  ADLScreening [facility] Assessment Services) Patient's cognitive ability adequate to safely complete daily activities?: Yes Patient able to express need for assistance with ADLs?: Yes Independently performs ADLs?: Yes (appropriate for developmental age)  Prior Inpatient Therapy Prior Inpatient Therapy: Yes Prior Therapy Dates: 07/2018, 06/2018, 10/2017 & 06/2016 Prior Therapy Facilty/Provider(s): [facility] Reason for Treatment: Bipolar  Prior Outpatient Therapy Prior Outpatient Therapy: No Does patient have an ACCT team?: No Does patient have Intensive In-House Services?  : No Does patient have [facility] services? : No Does patient have P4CC services?: No  ADL Screening (condition at time of admission) Patient's cognitive ability adequate to safely complete daily activities?: Yes Patient able to express need for assistance with ADLs?: Yes Independently performs ADLs?: Yes (appropriate for developmental age)       Abuse/Neglect Assessment (Assessment to be complete while patient is alone) Abuse/Neglect Assessment Can Be Completed: Yes Physical Abuse: Denies Verbal Abuse: Denies Sexual Abuse: Denies Exploitation of patient/patient's  resources: Denies Self-Neglect: Denies Values / Beliefs Cultural Requests During Hospitalization:  None Spiritual Requests During Hospitalization: None Consults Spiritual Care Consult Needed: No Transition of Care Team Consult Needed: No            Disposition:  Disposition Initial Assessment Completed for this Encounter: Yes Patient referred to: Other (Comment)(Consult with NP)  On Site Evaluation by:   Reviewed with Physician:    Laretta Alstrom 06/22/2019 11:23 PM

## 2019-06-22 NOTE — ED Notes (Addendum)
Pt belongings include:  1 black pant 1 white brief 1 red bra 1 green shirt 1 pair black shoes 1 red/black shirt 1 pair black socks 1 black purse 1 [employer] bag 1 blue bag

## 2019-06-22 NOTE — ED Triage Notes (Signed)
Pt arrived via [facility], under IVC. Per affidavit, pt has nothing to live for anymore. Pt has no job, friends or family. Pt went to [facility] today where she communicated thoughts of harming herself. Pt has hx/o Si in past. Pt denies HI at this time. Pt is calm and cooperative in triage. Pt tearful and sts, "I just cant keep living like this."

## 2019-06-22 NOTE — ED Notes (Signed)
Pharmacy at bedside to verify medications.

## 2019-06-23 ENCOUNTER — Emergency Department: Payer: Medicare HMO

## 2019-06-23 DIAGNOSIS — F3163 Bipolar disorder, current episode mixed, severe, without psychotic features: Secondary | ICD-10-CM | POA: Diagnosis not present

## 2019-06-23 LAB — GLUCOSE, CAPILLARY
Glucose-Capillary: 200 mg/dL — ABNORMAL HIGH (ref 70–99)
Glucose-Capillary: 426 mg/dL — ABNORMAL HIGH (ref 70–99)

## 2019-06-23 LAB — RESPIRATORY PANEL BY RT PCR (FLU A&B, COVID)
Influenza A by PCR: NEGATIVE
Influenza B by PCR: NEGATIVE
SARS Coronavirus 2 by RT PCR: NEGATIVE

## 2019-06-23 IMAGING — CR DG CHEST 2V
2 series · 2 of 2 positions shown · non-contrast
Comparison: [DATE] chest radiograph.

CLINICAL DATA: Placement radiograph.  Diabetes mellitus.

EXAM:
CHEST - 2 VIEW

[chest pa]
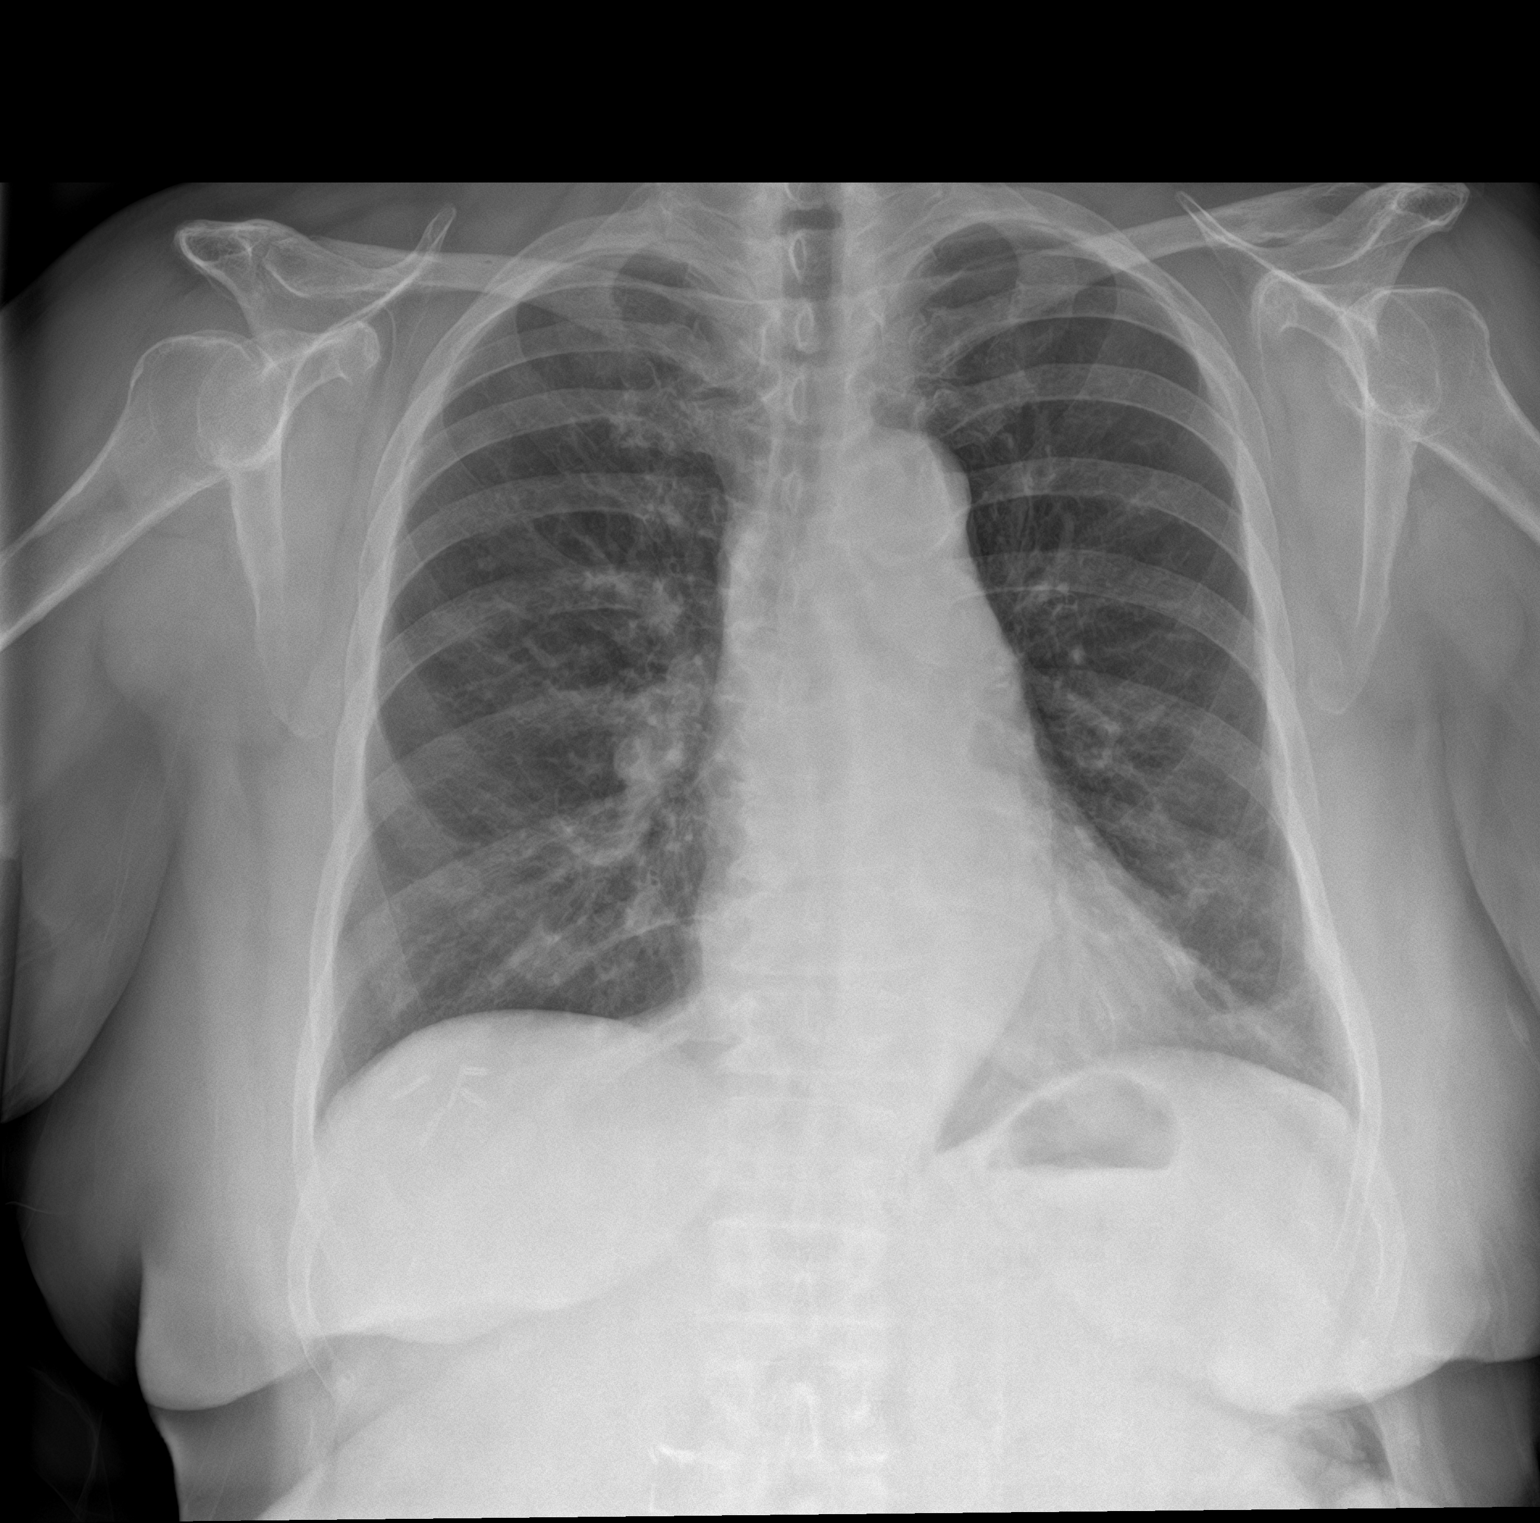

[chest lat]
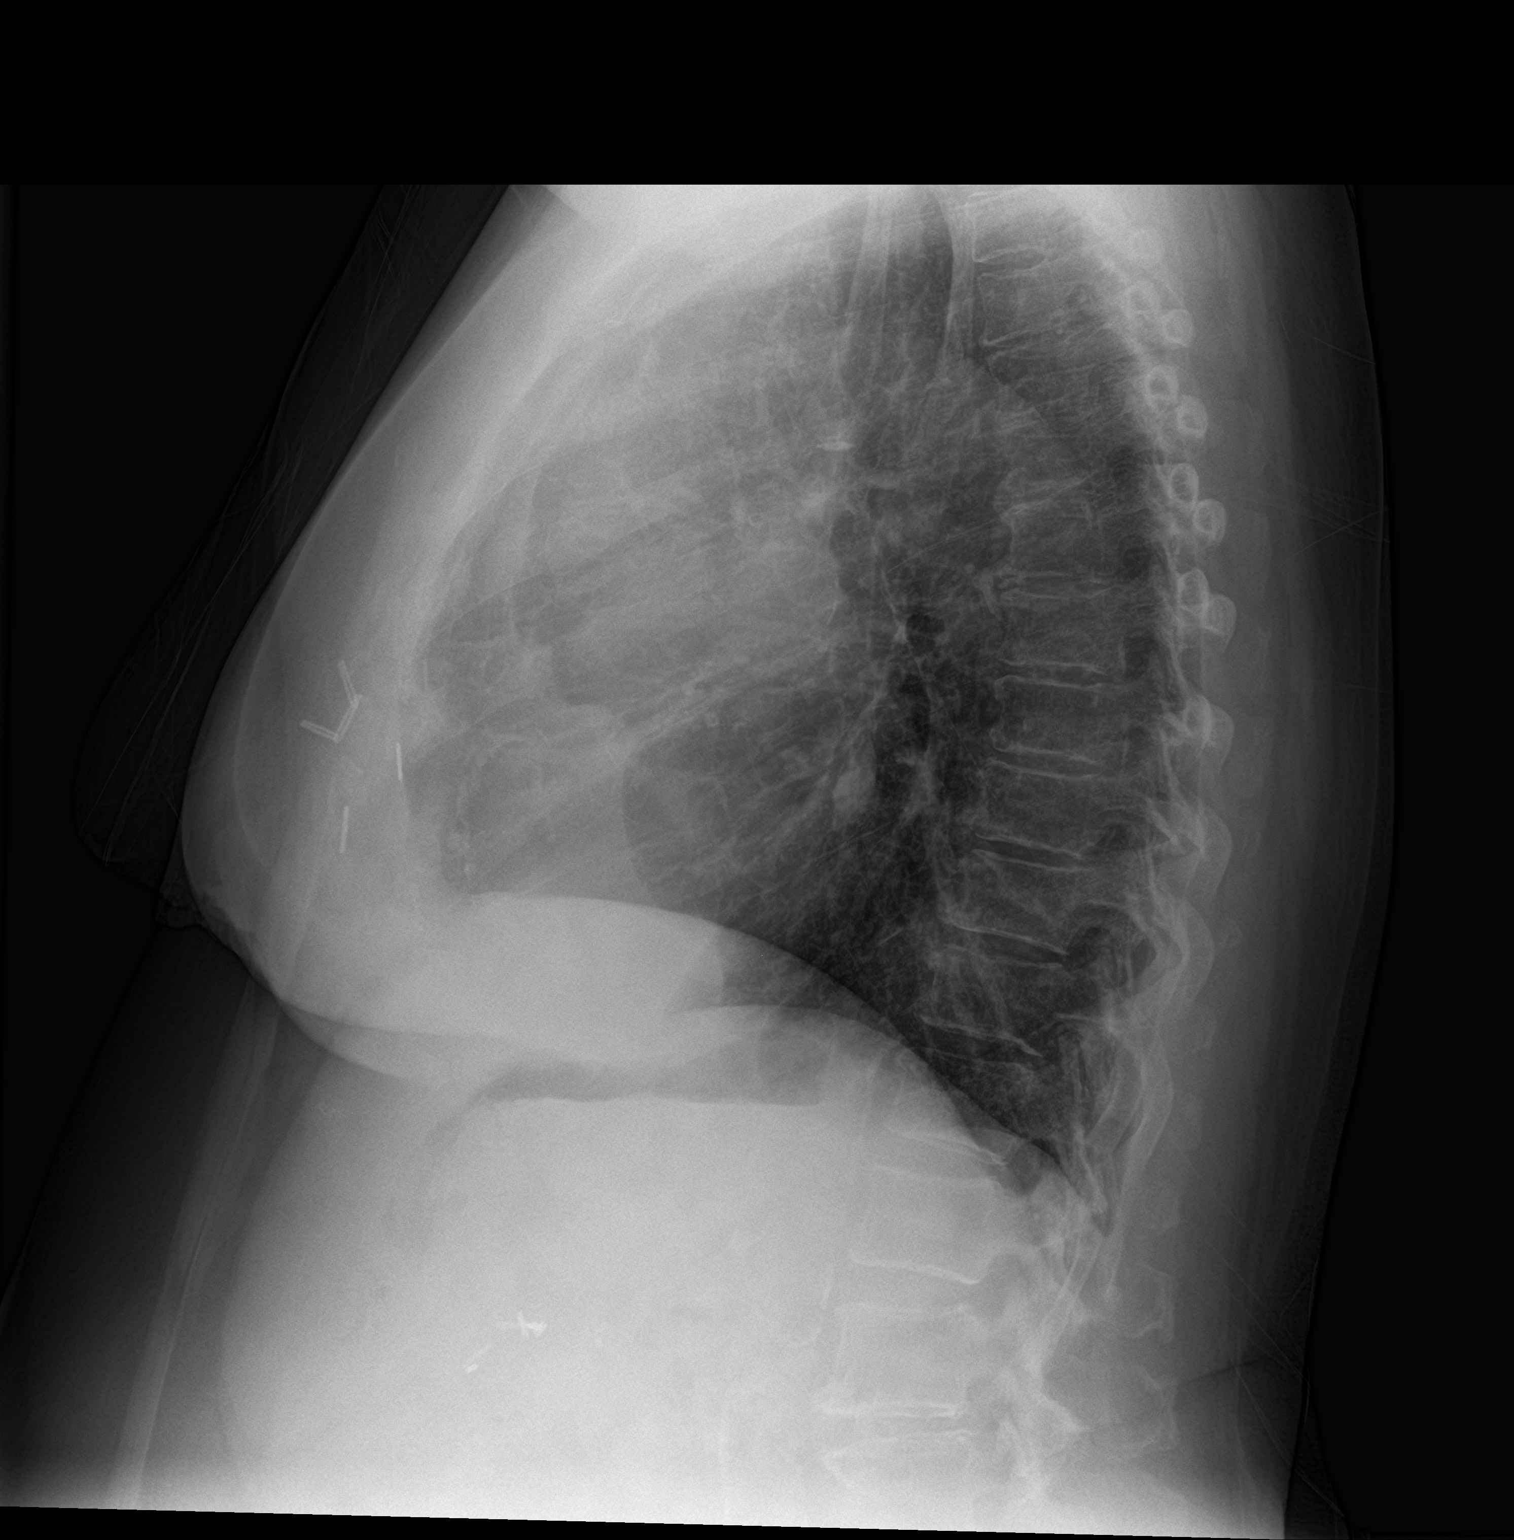

[2 of 2 positions shown; findings below may reference images not displayed]

FINDINGS: Stable cardiomediastinal silhouette with normal heart size. No
pneumothorax. No pleural effusion. Lungs appear clear, with no acute
consolidative airspace disease and no pulmonary edema.
Cholecystectomy clips are seen in the right upper quadrant of the
abdomen.
IMPRESSION: No active cardiopulmonary disease.

## 2019-06-23 MED ORDER — METOPROLOL SUCCINATE ER 50 MG PO TB24
50.0000 mg | ORAL_TABLET | Freq: Every day | ORAL | Status: DC
  Administered 2019-06-23: 50 mg via ORAL
  Filled 2019-06-23 (×2): qty 1

## 2019-06-23 MED ORDER — TRAZODONE HCL 100 MG PO TABS
100.0000 mg | ORAL_TABLET | Freq: Every day | ORAL | Status: DC
  Administered 2019-06-23 – 2019-06-24 (×2): 100 mg via ORAL
  Filled 2019-06-23 (×2): qty 1

## 2019-06-23 MED ORDER — INSULIN ASPART 100 UNIT/ML ~~LOC~~ SOLN
10.0000 [IU] | Freq: Once | SUBCUTANEOUS | Status: AC
  Administered 2019-06-23: 10 [IU] via SUBCUTANEOUS
  Filled 2019-06-23: qty 1

## 2019-06-23 MED ORDER — ROSUVASTATIN CALCIUM 20 MG PO TABS
40.0000 mg | ORAL_TABLET | Freq: Every day | ORAL | Status: DC
  Administered 2019-06-24: 40 mg via ORAL
  Filled 2019-06-23: qty 2

## 2019-06-23 MED ORDER — LORATADINE 10 MG PO TABS
10.0000 mg | ORAL_TABLET | Freq: Every day | ORAL | Status: DC
  Administered 2019-06-23 – 2019-06-24 (×2): 10 mg via ORAL
  Filled 2019-06-23 (×2): qty 1

## 2019-06-23 MED ORDER — GLIPIZIDE ER 10 MG PO TB24
10.0000 mg | ORAL_TABLET | Freq: Every day | ORAL | Status: DC
  Administered 2019-06-24: 10 mg via ORAL
  Filled 2019-06-23 (×2): qty 1

## 2019-06-23 NOTE — ED Notes (Signed)
IVC/  PENDING  PLACEMENT 

## 2019-06-23 NOTE — ED Notes (Signed)
Pt given dinner tray.

## 2019-06-23 NOTE — ED Notes (Signed)
Pt. Alert and oriented, warm and dry, in no distress. Pt. Denies SI, HI, and AVH. Pt. Encouraged to let nursing staff know of any concerns or needs. 

## 2019-06-23 NOTE — ED Notes (Signed)
Pt states she is diabetic and has not had blood sugar checked. This Probation officer asked ED tech to obtain CBG. CBG was 426 EDP made aware.

## 2019-06-23 NOTE — Progress Notes (Signed)
Inpatient Diabetes Program Recommendations  AACE/ADA: New Consensus Statement on Inpatient Glycemic Control (2015)  Target Ranges:  Prepandial:   less than 140 mg/dL      Peak postprandial:   less than 180 mg/dL (1-2 hours)      Critically ill patients:  140 - 180 mg/dL   Lab Results  Component Value Date   GLUCAP 126 (H) 07/27/2018   HGBA1C 9.6 (H) 07/21/2018    Review of Glycemic Control  Diabetes history: DM 2 Outpatient Diabetes medications: Glipizide 10 mg Daily, Januvia 100 mg Daily Current orders for Inpatient glycemic control: None  Glucose 323 in labs last night  Inpatient Diabetes Program Recommendations:    Consider CBGs ACHS   If oral DM meds not ordered consider Novolog 0-15 units tid + hs.  Thanks,  Tama Headings RN, MSN, BC-ADM Inpatient Diabetes Coordinator Team Pager [phone] (8a-5p)

## 2019-06-23 NOTE — ED Notes (Signed)
Pt ambulated to the rest room at this time. 

## 2019-06-23 NOTE — ED Notes (Signed)

## 2019-06-23 NOTE — Consult Note (Signed)
[facility] Consult   Reason for Consult:  Mental Health Problem Referring Physician:  Dr. Quentin Bennett Patient Identification: [name] MRN:  [id number] Principal Diagnosis: <principal problem not specified> Diagnosis:  Active Problems:   Bipolar I disorder, current or most recent episode manic, severe with mixed features ([place])   Essential hypertension   Hypothyroidism   Dyslipidemia   Diabetes ([place])   Adjustment disorder with mixed disturbance of emotions and conduct   Bipolar I disorder, most recent episode depressed with anxious distress ([place])   PTSD (post-traumatic stress disorder)   Bipolar 1 disorder with moderate mania (HCC)   Bipolar 1 disorder, manic, moderate (HCC)   Bipolar 1 disorder, mixed, severe (HCC)   Pain due to onychomycosis of toenails of both feet   Total Time spent with patient: 45 minutes  Subjective: "There is no point in living. I have no one." [name] is a 74 y.o. female presented to [facility] ED via law enforcement by way of [facility] under involuntary commitment status (IVC).  Per [facility] HPI, the patient called to report her suicidal ideation.  She expressed being depressed and did not want to live.  Her husband got custody of their children.  Her peer's support took pictures of the patient children and grandchildren. She then showed it to the patient, which made her upset.  The patient voiced she is not working and wants to get a part-time job to occupy her time.  She declared she does not have any friends or family. She reports her children do not want her to have anything to do with her. She discussed they are not in her life. The patient says she has taken pills and drunk antifreeze in the past.  She disclosed she possibly had eight suicide attempts. She reports multiple inpatient psychiatric hospitalizations and two prior suicide attempts.  The patient was last seen at [facility] ED on 06/02/19 for the same problems.  She was given  outpatient resources but never followed through with them.  The patient voiced she currently takes Depakote 100 mg pa.o., Fluoxetine 40 mg, and Klonopin 1 mg.  She states she has no energy or motivation.  The patient was seen face-to-face by this provider; chart reviewed and consulted with Dr. Quentin Bennett on 06/23/2019 due to the patient's care. It was discussed with the EDP that the patient does meet criteria to be admitted to the psychiatric inpatient unit.  The patient is alert and oriented x3, calm, emotional, cooperative, and mood-congruent with affect on evaluation.  The patient does not appear to be responding to internal or external stimuli. Neither is the patient presenting with any delusional thinking. The patient denies auditory or visual hallucinations. The patient admits still suicidal ideations but denies homicidal or self-harm ideations. The patient is not presenting with any psychotic or paranoid behaviors. During an encounter with the patient, she was able to answer questions appropriately. Plan: The patient is a safety risk to self and does require psychiatric inpatient admission for stabilization and treatment.   HPI: Per Dr. Quentin Bennett; [name] is a 74 y.o. female [place] past medical history presents from [place] due to suicidal ideation stating she plans to end it all.  Endorsing severe anhedonia.  Feels very lonely and no longer has the will to live.  Sent over to the ER for psychiatric admission by [place].  She is denying any sort of plan to me right now.  States that she is on Klonopin nightly.  Feels very anxious  right now.  Past Psychiatric History: Lengthy history of major depressive disorder managed with multiple medications.  Risk to Self: Suicidal Ideation: No-Not Currently/Within Last 6 Months Suicidal Intent: No Is patient at risk for suicide?: Yes Suicidal Plan?: Yes-Currently Present What has been your use of drugs/alcohol within the last 12 months?: None How  many times?: 0 Other Self Harm Risks: none Triggers for Past Attempts: None knownNo Risk to Others: Homicidal Ideation: No Thoughts of Harm to Others: No Current Homicidal Intent: No Current Homicidal Plan: No Access to Homicidal Means: No Identified Victim: none History of harm to others?: No Assessment of Violence: None Noted Violent Behavior Description: none Does patient have access to weapons?: No Criminal Charges Pending?: No Does patient have a court date: NoNo Prior Inpatient Therapy: Prior Inpatient Therapy: Yes Prior Therapy Dates: 07/2018, 06/2018, 10/2017 & 06/2016 Prior Therapy Facilty/Provider(s): [facility] Reason for Treatment: BipolarYes Prior Outpatient Therapy: Prior Outpatient Therapy: No Does patient have an ACCT team?: No Does patient have Intensive In-House Services?  : No Does patient have [facility] services? : No Does patient have P4CC services?: NoYes  Past Medical History:  Past Medical History:  Diagnosis Date  . Arthritis   . Breast cancer ([place])   . Cancer [facility]) 2004   right breast ca  . Diabetes mellitus without complication ([place])   . Hypercholesterolemia   . Hypertension   . Hypothyroid   . Seasonal allergies     Past Surgical History:  Procedure Laterality Date  . ABDOMINAL HYSTERECTOMY    . BREAST BIOPSY Left    neg  . BREAST EXCISIONAL BIOPSY Right 2004   breast ca and lumpectomy  . BREAST LUMPECTOMY Right 2004   breast ca  . CHOLECYSTECTOMY    . COLONOSCOPY WITH PROPOFOL N/A 02/13/2017   Procedure: COLONOSCOPY WITH PROPOFOL;  Surgeon: Jonathon Bellows, MD;  Location: [facility] ENDOSCOPY;  Service: Gastroenterology;  Laterality: N/A;  . KIDNEY STONE SURGERY     Family History:  Family History  Problem Relation Age of Onset  . Bladder Cancer Neg Hx   . Kidney cancer Neg Hx    Family Psychiatric  History: Unknown Social History:  Social History   Substance and Sexual Activity  Alcohol Use No     Social History   Substance and Sexual  Activity  Drug Use No    Social History   Socioeconomic History  . Marital status: Divorced    Spouse name: Not on file  . Number of children: Not on file  . Years of education: Not on file  . Highest education level: Not on file  Occupational History  . Not on file  Tobacco Use  . Smoking status: Former Smoker    Types: Cigarettes  . Smokeless tobacco: Never Used  Substance and Sexual Activity  . Alcohol use: No  . Drug use: No  . Sexual activity: Not Currently  Other Topics Concern  . Not on file  Social History Narrative  . Not on file   Social Determinants of Health   Financial Resource Strain:   . Difficulty of Paying Living Expenses: Not on file  Food Insecurity:   . Worried About Charity fundraiser in the Last Year: Not on file  . Ran Out of Food in the Last Year: Not on file  Transportation Needs:   . Lack of Transportation (Medical): Not on file  . Lack of Transportation (Non-Medical): Not on file  Physical Activity:   . Days of Exercise per  Week: Not on file  . Minutes of Exercise per Session: Not on file  Stress:   . Feeling of Stress : Not on file  Social Connections:   . Frequency of Communication with Friends and Family: Not on file  . Frequency of Social Gatherings with Friends and Family: Not on file  . Attends Religious Services: Not on file  . Active Member of Clubs or Organizations: Not on file  . Attends Archivist Meetings: Not on file  . Marital Status: Not on file   Allergies:   Allergies  Allergen Reactions  . Navane [Thiothixene] Other (See Comments)    Reaction:  Unknown  . Penicillins Rash    Has patient had a PCN reaction causing immediate rash, facial/tongue/throat swelling, SOB or lightheadedness with hypotension: No Has patient had a PCN reaction causing severe rash involving mucus membranes or skin necrosis: No Has patient had a PCN reaction that required hospitalization: No Has patient had a PCN reaction occurring  within the last 10 years: No If all of the above answers are "NO", then may proceed with Cephalosporin use.     Labs:  Results for orders placed or performed during the hospital encounter of 06/22/19 (from the past 48 hour(s))  Comprehensive metabolic panel     Status: Abnormal   Collection Time: 06/22/19  8:55 PM  Result Value Ref Range   Sodium 133 (L) 135 - 145 mmol/L   Potassium 4.8 3.5 - 5.1 mmol/L   Chloride 98 98 - 111 mmol/L   CO2 29 22 - 32 mmol/L   Glucose, Bld 323 (H) 70 - 99 mg/dL   BUN 27 (H) 8 - 23 mg/dL   Creatinine, Ser 1.47 (H) 0.44 - 1.00 mg/dL   Calcium 9.4 8.9 - 10.3 mg/dL   Total Protein 7.6 6.5 - 8.1 g/dL   Albumin 4.0 3.5 - 5.0 g/dL   AST 15 15 - 41 U/L   ALT 14 0 - 44 U/L   Alkaline Phosphatase 57 38 - 126 U/L   Total Bilirubin 0.5 0.3 - 1.2 mg/dL   GFR calc non Af Amer 35 (L) >60 mL/min   GFR calc Af Amer 40 (L) >60 mL/min   Anion gap 6 5 - 15    Comment: Performed at [facility], [address] [place], [place] [address]  Ethanol     Status: None   Collection Time: 06/22/19  8:55 PM  Result Value Ref Range   Alcohol, Ethyl (B) <10 <10 mg/dL    Comment: (NOTE) Lowest detectable limit for serum alcohol is 10 mg/dL. For medical purposes only. Performed at [facility], [address] [place], [place] [id number]   Salicylate level     Status: Abnormal   Collection Time: 06/22/19  8:55 PM  Result Value Ref Range   Salicylate Lvl [id number] (L) 7.0 - 30.0 mg/dL    Comment: Performed at [facility], [address] [place],  [address]  Acetaminophen level     Status: Abnormal   Collection Time: 06/22/19  8:55 PM  Result Value Ref Range   Acetaminophen (Tylenol), Serum <10 (L) 10 - 30 ug/mL    Comment: (NOTE) Therapeutic concentrations vary significantly. A range of 10-30 ug/mL  may be an effective concentration for many patients. However, some  are best treated at concentrations outside of this  range. Acetaminophen concentrations >150 ug/mL at 4 hours after ingestion  and >50 ug/mL at 12 hours after ingestion are often  associated with  toxic reactions. Performed at [facility], [address] [place], [place] [address]   cbc     Status: Abnormal   Collection Time: 06/22/19  8:55 PM  Result Value Ref Range   WBC 9.1 4.0 - 10.5 K/uL   RBC 5.48 (H) 3.87 - 5.11 MIL/uL   Hemoglobin 15.6 (H) 12.0 - 15.0 g/dL   HCT 48.4 (H) 36.0 - 46.0 %   MCV 88.3 80.0 - 100.0 fL   MCH 28.5 26.0 - 34.0 pg   MCHC 32.2 30.0 - 36.0 g/dL   RDW 12.7 11.5 - 15.5 %   Platelets 192 150 - 400 K/uL   nRBC 0.0 0.0 - 0.2 %    Comment: Performed at [facility], [address] [place], [place] [address]  Urine Drug Screen, Qualitative     Status: None   Collection Time: 06/22/19  8:55 PM  Result Value Ref Range   Tricyclic, Ur Screen NONE DETECTED NONE DETECTED   Amphetamines, Ur Screen NONE DETECTED NONE DETECTED   MDMA (Ecstasy)Ur Screen NONE DETECTED NONE DETECTED   Cocaine Metabolite,Ur [place] NONE DETECTED NONE DETECTED   Opiate, Ur Screen NONE DETECTED NONE DETECTED   Phencyclidine (PCP) Ur S NONE DETECTED NONE DETECTED   Cannabinoid 50 Ng, Ur [place] NONE DETECTED NONE DETECTED   Barbiturates, Ur Screen NONE DETECTED NONE DETECTED   Benzodiazepine, Ur Scrn NONE DETECTED NONE DETECTED   Methadone Scn, Ur NONE DETECTED NONE DETECTED    Comment: (NOTE) Tricyclics + metabolites, urine    Cutoff 1000 ng/mL Amphetamines + metabolites, urine  Cutoff 1000 ng/mL MDMA (Ecstasy), urine              Cutoff 500 ng/mL Cocaine Metabolite, urine          Cutoff 300 ng/mL Opiate + metabolites, urine        Cutoff 300 ng/mL Phencyclidine (PCP), urine         Cutoff 25 ng/mL Cannabinoid, urine                 Cutoff 50 ng/mL Barbiturates + metabolites, urine  Cutoff 200 ng/mL Benzodiazepine, urine              Cutoff 200 ng/mL Methadone, urine                   Cutoff 300 ng/mL The urine drug  screen provides only a preliminary, unconfirmed analytical test result and should not be used for non-medical purposes. Clinical consideration and professional judgment should be applied to any positive drug screen result due to possible interfering substances. A more specific alternate chemical method must be used in order to obtain a confirmed analytical result. Gas chromatography / mass spectrometry (GC/MS) is the preferred confirmat ory method. Performed at [facility], [address] [place], [place] [address]   TSH     Status: None   Collection Time: 06/22/19  8:55 PM  Result Value Ref Range   TSH 1.939 0.350 - 4.500 uIU/mL    Comment: Performed by a 3rd Generation assay with a functional sensitivity of <=0.01 uIU/mL. Performed at [facility], [address] [place], [place] [address]   Respiratory Panel by RT PCR (Flu A&B, Covid) - Nasopharyngeal Swab     Status: None   Collection Time: 06/22/19 10:51 PM   Specimen: Nasopharyngeal Swab  Result Value Ref Range   SARS Coronavirus 2 by RT PCR NEGATIVE NEGATIVE    Comment: (  NOTE) SARS-CoV-2 target nucleic acids are NOT DETECTED. The SARS-CoV-2 RNA is generally detectable in upper respiratoy specimens during the acute phase of infection. The lowest concentration of SARS-CoV-2 viral copies this assay can detect is 131 copies/mL. A negative result does not preclude SARS-Cov-2 infection and should not be used as the sole basis for treatment or other patient management decisions. A negative result may occur with  improper specimen collection/handling, submission of specimen other than nasopharyngeal swab, presence of viral mutation(s) within the areas targeted by this assay, and inadequate number of viral copies (<131 copies/mL). A negative result must be combined with clinical observations, patient history, and epidemiological information. The expected result is Negative. Fact Sheet for Patients:   PinkCheek.be Fact Sheet for Healthcare Providers:  GravelBags.it This test is not yet ap proved or cleared by the [place] FDA and  has been authorized for detection and/or diagnosis of SARS-CoV-2 by FDA under an Emergency Use Authorization (EUA). This EUA will remain  in effect (meaning this test can be used) for the duration of the COVID-19 declaration under Section 564(b)(1) of the Act, 21 U.S.C. section 360bbb-3(b)(1), unless the authorization is terminated or revoked sooner.    Influenza A by PCR NEGATIVE NEGATIVE   Influenza B by PCR NEGATIVE NEGATIVE    Comment: (NOTE) The Xpert Xpress SARS-CoV-2/FLU/RSV assay is intended as an aid in  the diagnosis of influenza from Nasopharyngeal swab specimens and  should not be used as a sole basis for treatment. Nasal washings and  aspirates are unacceptable for Xpert Xpress SARS-CoV-2/FLU/RSV  testing. Fact Sheet for Patients: PinkCheek.be Fact Sheet for Healthcare Providers: GravelBags.it This test is not yet approved or cleared by the [place] FDA and  has been authorized for detection and/or diagnosis of SARS-CoV-2 by  FDA under an Emergency Use Authorization (EUA). This EUA will remain  in effect (meaning this test can be used) for the duration of the  Covid-19 declaration under Section 564(b)(1) of the Act, 21  U.S.C. section 360bbb-3(b)(1), unless the authorization is  terminated or revoked. Performed at [facility], [address], [place], [place] [address]     Current Facility-Administered Medications  Medication Dose Route Frequency Provider Last Rate Last Admin  . clonazePAM (KLONOPIN) tablet 1 mg  1 mg Oral QHS Merlyn Lot, MD   1 mg at 06/22/19 2236  . divalproex (DEPAKOTE ER) 24 hr tablet 1,000 mg  1,000 mg Oral QHS Merlyn Lot, MD      . enalapril (VASOTEC) tablet 20 mg   20 mg Oral BID Merlyn Lot, MD   20 mg at 06/22/19 2236  . FLUoxetine (PROZAC) capsule 40 mg  40 mg Oral Daily Merlyn Lot, MD      . levothyroxine (SYNTHROID) tablet 88 mcg  88 mcg Oral QAC breakfast Merlyn Lot, MD       Current Outpatient Medications  Medication Sig Dispense Refill  . clonazePAM (KLONOPIN) 1 MG tablet Take 1 tablet (1 mg total) by mouth at bedtime. 30 tablet 1  . divalproex (DEPAKOTE ER) 500 MG 24 hr tablet Take 2 tablets (1,000 mg total) by mouth at bedtime. 60 tablet 1  . enalapril (VASOTEC) 20 MG tablet Take 1 tablet (20 mg total) by mouth 2 (two) times daily. 60 tablet 1  . FLUoxetine (PROZAC) 40 MG capsule Take 1 capsule (40 mg total) by mouth daily. 30 capsule 1  . glipiZIDE (GLUCOTROL XL) 5 MG 24 hr tablet Take 2 tablets by mouth 2 (two) times  daily.    Marland Kitchen JANUVIA 100 MG tablet Take 100 mg by mouth daily.     Marland Kitchen levothyroxine (SYNTHROID) 88 MCG tablet Take 88 mcg by mouth daily before breakfast.     . loratadine (CLARITIN) 10 MG tablet Take 1 tablet (10 mg total) by mouth daily. 30 tablet 1  . metoprolol succinate (TOPROL-XL) 50 MG 24 hr tablet Take 50 mg by mouth daily.    Marland Kitchen PAZEO 0.7 % SOLN Take 1 drop by mouth daily.     . rosuvastatin (CRESTOR) 40 MG tablet Take 40 mg by mouth daily.    . simvastatin (ZOCOR) 20 MG tablet Take 1 tablet (20 mg total) by mouth daily. (Patient not taking: Reported on 06/02/2019) 30 tablet 1  . traZODone (DESYREL) 100 MG tablet Take 1 tablet (100 mg total) by mouth at bedtime. (Patient not taking: Reported on 06/22/2019) 30 tablet 1    Musculoskeletal: Strength & Muscle Tone: within normal limits Gait & Station: normal Patient leans: N/A  Psychiatric Specialty Exam: Physical Exam  Nursing note and vitals reviewed. Constitutional: She is oriented to person, place, and time. She appears well-developed and well-nourished.  Cardiovascular: Normal rate and regular rhythm.  Respiratory: Effort normal.  Musculoskeletal:      Cervical back: Normal range of motion and neck supple.  Neurological: She is alert and oriented to person, place, and time.    Review of Systems  HENT: Negative for congestion.   Eyes: Negative for discharge.  Respiratory: Negative for cough.   Genitourinary: Negative for flank pain.  Neurological: Negative for speech difficulty.  Psychiatric/Behavioral: Positive for agitation, dysphoric mood and suicidal ideas. Negative for behavioral problems, confusion, decreased concentration, hallucinations, self-injury and sleep disturbance. The patient is nervous/anxious. The patient is not hyperactive.   All other systems reviewed and are negative.   Blood pressure 118/90, pulse 64, temperature 98 F (36.7 C), temperature source Oral, resp. rate 17, SpO2 99 %.There is no height or weight on file to calculate BMI.  General Appearance: Fairly Groomed  Eye Contact:  Good  Speech:  Normal Rate  Volume:  Normal  Mood:  Negative  Affect:  Appropriate and Full Range  Thought Process:  Coherent and Goal Directed  Orientation:  Full (Time, Place, and Person)  Thought Content:  Logical  Suicidal Thoughts:  Yes.  with intent/plan  Homicidal Thoughts:  No  Memory:  Recent;   Fair  Judgement:  Fair  Insight:  Fair  Psychomotor Activity:  Normal  Concentration:  Concentration: Good  Recall:  [place] of Knowledge:  Good  Language:  Good  Akathisia:  No  Handed:  Right  AIMS (if indicated):     Assets:  Communication Skills Desire for Improvement Financial Resources/Insurance Housing Leisure Time Resilience Talents/Skills  ADL's:  Intact  Cognition:  WNL  Sleep:        Treatment Plan Summary: Patient meets criteria for psychiatric inpatient admission.  Disposition: Recommend psychiatric Inpatient admission when medically cleared. Supportive therapy provided about ongoing stressors.  Caroline Sauger, NP 06/23/2019 12:59 AM

## 2019-06-23 NOTE — ED Notes (Addendum)
Received a call from [facility]  - information to be faxed to them that they requested with potential for pt acceptance   No TTS today    CBC, metb, CXR, EKG,UA/UDS, covid, pta medication, medical diagnosis/history to be faxed to [phone]    Call back number  [phone]

## 2019-06-24 ENCOUNTER — Inpatient Hospital Stay
Admission: AD | Admit: 2019-06-24 | Discharge: 2019-06-29 | DRG: 885 | Disposition: A | Discharge status: Home / Self Care | Payer: Medicare HMO | Attending: Psychiatry | Admitting: Psychiatry

## 2019-06-24 DIAGNOSIS — F332 Major depressive disorder, recurrent severe without psychotic features: Secondary | ICD-10-CM | POA: Diagnosis not present

## 2019-06-24 DIAGNOSIS — Z79899 Other long term (current) drug therapy: Secondary | ICD-10-CM | POA: Diagnosis not present

## 2019-06-24 DIAGNOSIS — I10 Essential (primary) hypertension: Secondary | ICD-10-CM | POA: Diagnosis present

## 2019-06-24 DIAGNOSIS — E785 Hyperlipidemia, unspecified: Secondary | ICD-10-CM | POA: Diagnosis present

## 2019-06-24 DIAGNOSIS — E039 Hypothyroidism, unspecified: Secondary | ICD-10-CM | POA: Diagnosis present

## 2019-06-24 DIAGNOSIS — F3163 Bipolar disorder, current episode mixed, severe, without psychotic features: Secondary | ICD-10-CM | POA: Diagnosis not present

## 2019-06-24 DIAGNOSIS — Z853 Personal history of malignant neoplasm of breast: Secondary | ICD-10-CM

## 2019-06-24 DIAGNOSIS — Z888 Allergy status to other drugs, medicaments and biological substances status: Secondary | ICD-10-CM | POA: Diagnosis not present

## 2019-06-24 DIAGNOSIS — Z7984 Long term (current) use of oral hypoglycemic drugs: Secondary | ICD-10-CM

## 2019-06-24 DIAGNOSIS — Z880 Allergy status to penicillin: Secondary | ICD-10-CM

## 2019-06-24 DIAGNOSIS — R45851 Suicidal ideations: Secondary | ICD-10-CM | POA: Diagnosis present

## 2019-06-24 DIAGNOSIS — Z87891 Personal history of nicotine dependence: Secondary | ICD-10-CM | POA: Diagnosis not present

## 2019-06-24 DIAGNOSIS — M1990 Unspecified osteoarthritis, unspecified site: Secondary | ICD-10-CM | POA: Diagnosis present

## 2019-06-24 DIAGNOSIS — F314 Bipolar disorder, current episode depressed, severe, without psychotic features: Secondary | ICD-10-CM | POA: Diagnosis present

## 2019-06-24 DIAGNOSIS — E119 Type 2 diabetes mellitus without complications: Secondary | ICD-10-CM | POA: Diagnosis present

## 2019-06-24 DIAGNOSIS — Z79890 Hormone replacement therapy (postmenopausal): Secondary | ICD-10-CM

## 2019-06-24 DIAGNOSIS — E1169 Type 2 diabetes mellitus with other specified complication: Secondary | ICD-10-CM

## 2019-06-24 LAB — GLUCOSE, CAPILLARY
Glucose-Capillary: 245 mg/dL — ABNORMAL HIGH (ref 70–99)
Glucose-Capillary: 233 mg/dL — ABNORMAL HIGH (ref 70–99)
Glucose-Capillary: 273 mg/dL — ABNORMAL HIGH (ref 70–99)

## 2019-06-24 MED ORDER — LORATADINE 10 MG PO TABS
10.0000 mg | ORAL_TABLET | Freq: Every day | ORAL | Status: DC
  Administered 2019-06-25 – 2019-06-29 (×5): 10 mg via ORAL
  Filled 2019-06-24 (×5): qty 1

## 2019-06-24 MED ORDER — INSULIN ASPART 100 UNIT/ML ~~LOC~~ SOLN
0.0000 [IU] | Freq: Every day | SUBCUTANEOUS | Status: DC
  Administered 2019-06-26: 2 [IU] via SUBCUTANEOUS
  Administered 2019-06-27: 4 [IU] via SUBCUTANEOUS
  Administered 2019-06-28: 2 [IU] via SUBCUTANEOUS
  Filled 2019-06-24 (×3): qty 1

## 2019-06-24 MED ORDER — INSULIN ASPART 100 UNIT/ML ~~LOC~~ SOLN
0.0000 [IU] | Freq: Every day | SUBCUTANEOUS | Status: DC
  Administered 2019-06-24: 3 [IU] via SUBCUTANEOUS
  Filled 2019-06-24: qty 1

## 2019-06-24 MED ORDER — INSULIN ASPART 100 UNIT/ML ~~LOC~~ SOLN
10.0000 [IU] | Freq: Once | SUBCUTANEOUS | Status: DC
  Filled 2019-06-24: qty 1

## 2019-06-24 MED ORDER — ALUM & MAG HYDROXIDE-SIMETH 200-200-20 MG/5ML PO SUSP: 30.0000 mL | ORAL | Status: DC | PRN

## 2019-06-24 MED ORDER — DIVALPROEX SODIUM ER 500 MG PO TB24: 1000.0000 mg | ORAL_TABLET | Freq: Every day | ORAL | Status: DC

## 2019-06-24 MED ORDER — FLUOXETINE HCL 20 MG PO CAPS
40.0000 mg | ORAL_CAPSULE | Freq: Every day | ORAL | Status: DC
  Administered 2019-06-25 – 2019-06-29 (×5): 40 mg via ORAL
  Filled 2019-06-24 (×5): qty 2

## 2019-06-24 MED ORDER — ACETAMINOPHEN 325 MG PO TABS
650.0000 mg | ORAL_TABLET | Freq: Four times a day (QID) | ORAL | Status: DC | PRN
  Administered 2019-06-24: 650 mg via ORAL
  Filled 2019-06-24: qty 2

## 2019-06-24 MED ORDER — MAGNESIUM HYDROXIDE 400 MG/5ML PO SUSP: 30.0000 mL | Freq: Every day | ORAL | Status: DC | PRN

## 2019-06-24 MED ORDER — INSULIN ASPART 100 UNIT/ML ~~LOC~~ SOLN
0.0000 [IU] | Freq: Three times a day (TID) | SUBCUTANEOUS | Status: DC
  Administered 2019-06-24: 3 [IU] via SUBCUTANEOUS
  Filled 2019-06-24: qty 1

## 2019-06-24 MED ORDER — ACETAMINOPHEN 325 MG PO TABS: 650.0000 mg | ORAL_TABLET | Freq: Four times a day (QID) | ORAL | Status: DC | PRN

## 2019-06-24 MED ORDER — TRAZODONE HCL 100 MG PO TABS
100.0000 mg | ORAL_TABLET | Freq: Every day | ORAL | Status: DC
  Administered 2019-06-26 – 2019-06-28 (×3): 100 mg via ORAL
  Filled 2019-06-24 (×3): qty 1

## 2019-06-24 MED ORDER — INSULIN ASPART 100 UNIT/ML ~~LOC~~ SOLN
0.0000 [IU] | Freq: Three times a day (TID) | SUBCUTANEOUS | Status: DC
  Administered 2019-06-25: 2 [IU] via SUBCUTANEOUS
  Administered 2019-06-25: 5 [IU] via SUBCUTANEOUS
  Administered 2019-06-25 – 2019-06-26 (×2): 3 [IU] via SUBCUTANEOUS
  Administered 2019-06-26: 2 [IU] via SUBCUTANEOUS
  Administered 2019-06-26: 5 [IU] via SUBCUTANEOUS
  Administered 2019-06-27: 3 [IU] via SUBCUTANEOUS
  Administered 2019-06-27: 5 [IU] via SUBCUTANEOUS
  Administered 2019-06-27: 7 [IU] via SUBCUTANEOUS
  Administered 2019-06-28 (×2): 5 [IU] via SUBCUTANEOUS
  Administered 2019-06-28 – 2019-06-29 (×2): 3 [IU] via SUBCUTANEOUS
  Administered 2019-06-29: 5 [IU] via SUBCUTANEOUS
  Filled 2019-06-24 (×10): qty 1

## 2019-06-24 MED ORDER — METOPROLOL SUCCINATE ER 25 MG PO TB24
50.0000 mg | ORAL_TABLET | Freq: Every day | ORAL | Status: DC
  Administered 2019-06-25: 50 mg via ORAL
  Filled 2019-06-24 (×2): qty 2

## 2019-06-24 MED ORDER — LEVOTHYROXINE SODIUM 88 MCG PO TABS
88.0000 ug | ORAL_TABLET | Freq: Every day | ORAL | Status: DC
  Administered 2019-06-25 – 2019-06-29 (×5): 88 ug via ORAL
  Filled 2019-06-24 (×6): qty 1

## 2019-06-24 MED ORDER — CLONAZEPAM 1 MG PO TABS
1.0000 mg | ORAL_TABLET | Freq: Every day | ORAL | Status: DC
  Administered 2019-06-26 – 2019-06-28 (×3): 1 mg via ORAL
  Filled 2019-06-24 (×3): qty 1

## 2019-06-24 MED ORDER — ENALAPRIL MALEATE 20 MG PO TABS
20.0000 mg | ORAL_TABLET | Freq: Two times a day (BID) | ORAL | Status: DC
  Administered 2019-06-25 (×2): 20 mg via ORAL
  Filled 2019-06-24 (×3): qty 1

## 2019-06-24 MED ORDER — ACETAMINOPHEN 325 MG PO TABS
650.0000 mg | ORAL_TABLET | Freq: Once | ORAL | Status: AC
  Administered 2019-06-24: 650 mg via ORAL
  Filled 2019-06-24: qty 2

## 2019-06-24 MED ORDER — GLIPIZIDE ER 5 MG PO TB24
10.0000 mg | ORAL_TABLET | Freq: Every day | ORAL | Status: DC
  Administered 2019-06-25 – 2019-06-29 (×5): 10 mg via ORAL
  Filled 2019-06-24 (×5): qty 2

## 2019-06-24 MED ORDER — ROSUVASTATIN CALCIUM 20 MG PO TABS
40.0000 mg | ORAL_TABLET | Freq: Every day | ORAL | Status: DC
  Administered 2019-06-25 – 2019-06-28 (×4): 40 mg via ORAL
  Filled 2019-06-24 (×6): qty 2

## 2019-06-24 NOTE — Progress Notes (Signed)
Inpatient Diabetes Program Recommendations  AACE/ADA: New Consensus Statement on Inpatient Glycemic Control (2015)  Target Ranges:  Prepandial:   less than 140 mg/dL      [place] postprandial:   less than 180 mg/dL (1-2 hours)      Critically ill patients:  140 - 180 mg/dL   Lab Results  Component Value Date   GLUCAP 245 (H) 06/24/2019   HGBA1C 9.6 (H) 07/21/2018   Review of Glycemic Control  Diabetes history: DM 2 Outpatient Diabetes medications: Glipizide 10 mg Daily, Januvia 100 mg Daily Current orders for Inpatient glycemic control:  Glucotrol XL 10 mg daily Inpatient Diabetes Program Recommendations:    While holding in ED, please order CBG's tid with meals and HS. Also consider adding Novolog sensitive correction tid with meals and HS.    Thanks  Adah Perl, RN, BC-ADM Inpatient Diabetes Coordinator Pager [phone] (8a-5p)

## 2019-06-24 NOTE — ED Provider Notes (Addendum)
-----------------------------------------   5:15 AM on 06/24/2019 -----------------------------------------   Blood pressure 132/84, pulse 60, temperature 97.9 F (36.6 C), temperature source Oral, resp. rate 18, SpO2 95 %.  I was called by the nurse as patient was noted to be hypoxic while sleeping. I evaluated patient, she denies SOB, cough, CP. I sat her up and ask her to take a few deep breaths and cough a few times. Her sats remained >95% on RA. Most likely atelectasis vs OSA.   The patient had no acute events since last update.  Calm and cooperative at this time.  Disposition is pending per Psychiatry/Behavioral Medicine team recommendations.     Alfred Levins, [place], MD 06/24/19 [place], [place], MD 06/24/19 [phone]

## 2019-06-24 NOTE — ED Notes (Signed)
Gave pt breakfast tray with juice. 

## 2019-06-24 NOTE — ED Notes (Addendum)
ED tech notified writer of patient's O2 being in the higher 80%. This Probation officer went and assessed patient after patient returned from the restroom. Patient O2 stats 87-88% on RA. Patient placed on 2/Liter Nasal canula. O2 became 96-97%. EDP made aware.

## 2019-06-24 NOTE — BH Assessment (Signed)
TTS staffed patient with [facility], patient blood sugar will need to be under 300 for at least 24 hours before being admitted. TTS will follow-up with levels tonight 06/24/19.

## 2019-06-24 NOTE — ED Notes (Signed)
IVC/Pending Placement 

## 2019-06-24 NOTE — ED Notes (Signed)
Pt with bp 95/66 and HR 50. Enalapril and metoprolol held. Dr. Joan Mayans notified.

## 2019-06-24 NOTE — ED Notes (Signed)
Pt given graham crackers, peanut butter and diet sprite per nurse Maudie Mercury, RN.

## 2019-06-24 NOTE — ED Notes (Signed)
Pt co pain to her hips and requesting tylenol.Dr Quentin Cornwall informed and new order received.

## 2019-06-24 NOTE — ED Notes (Signed)
Pt was given lunch tray with juice.

## 2019-06-24 NOTE — ED Notes (Signed)
Received care from [place], [place]. Pt shown to room and bathroom. Pt given blankets. Pt eating meal. Denies further needs at this time.

## 2019-06-25 ENCOUNTER — Encounter: Payer: Self-pay | Admitting: Behavioral Health

## 2019-06-25 ENCOUNTER — Other Ambulatory Visit: Payer: Self-pay

## 2019-06-25 LAB — GLUCOSE, CAPILLARY
Glucose-Capillary: 197 mg/dL — ABNORMAL HIGH (ref 70–99)
Glucose-Capillary: 277 mg/dL — ABNORMAL HIGH (ref 70–99)
Glucose-Capillary: 225 mg/dL — ABNORMAL HIGH (ref 70–99)

## 2019-06-25 MED ORDER — LINAGLIPTIN 5 MG PO TABS
5.0000 mg | ORAL_TABLET | Freq: Every day | ORAL | Status: DC
  Administered 2019-06-25 – 2019-06-29 (×5): 5 mg via ORAL
  Filled 2019-06-25 (×5): qty 1

## 2019-06-25 MED ORDER — LURASIDONE HCL 40 MG PO TABS
20.0000 mg | ORAL_TABLET | Freq: Every day | ORAL | Status: DC
  Administered 2019-06-25 – 2019-06-28 (×4): 20 mg via ORAL
  Filled 2019-06-25 (×4): qty 1

## 2019-06-25 NOTE — Progress Notes (Signed)
Patient didn't get her synthroid medication not currently available.

## 2019-06-25 NOTE — Tx Team (Signed)
Initial Treatment Plan 06/25/2019 6:32 AM [name] [id number]    PATIENT STRESSORS: Health problems Marital or family conflict Medication change or noncompliance   PATIENT STRENGTHS: Ability for insight Average or above average intelligence Communication skills Motivation for treatment/growth   PATIENT IDENTIFIED PROBLEMS: Suicidal ideation     Depression                  DISCHARGE CRITERIA:  Motivation to continue treatment in a less acute level of care Need for constant or close observation no longer present  PRELIMINARY DISCHARGE PLAN: Outpatient therapy  PATIENT/FAMILY INVOLVEMENT: This treatment plan has been presented to and reviewed with the patient, [name], The patient and family have been given the opportunity to ask questions and make suggestions.  Harl Bowie, RN 06/25/2019, 6:32 AM

## 2019-06-25 NOTE — [facility] Group Notes (Signed)
LCSW Group Therapy Note  06/25/2019 2:18 PM  Type of Therapy/Topic:  Group Therapy:  Emotion Regulation  Participation Level:  Minimal   Description of Group:   The purpose of this group is to assist patients in learning to regulate negative emotions and experience positive emotions. Patients will be guided to discuss ways in which they have been vulnerable to their negative emotions. These vulnerabilities will be juxtaposed with experiences of positive emotions or situations, and patients will be challenged to use positive emotions to combat negative ones. Special emphasis will be placed on coping with negative emotions in conflict situations, and patients will process healthy conflict resolution skills.  Therapeutic Goals: 1. Patient will identify two positive emotions or experiences to reflect on in order to balance out negative emotions 2. Patient will label two or more emotions that they find the most difficult to experience 3. Patient will demonstrate positive conflict resolution skills through discussion and/or role plays  Summary of Patient Progress: Pt was present in group but did not engage much in the group discussion. Pt did report that she has a family member that she had to kick out of her life because they were always yelling at her and being negative. Pt also discussed that she never feels like she belongs.   Therapeutic Modalities:   Cognitive Behavioral Therapy Feelings Identification Dialectical Behavioral Therapy   Evalina Field, MSW, LCSW Clinical Social Work 06/25/2019 2:18 PM

## 2019-06-25 NOTE — Progress Notes (Signed)
Recreation Therapy Notes  INPATIENT RECREATION THERAPY ASSESSMENT  Patient Details Name: [name] MRN: [id number] DOB: 1945-07-13 Today's Date: 06/25/2019       Information Obtained From: Patient  Able to Participate in Assessment/Interview: Yes  Patient Presentation: Responsive  Reason for Admission (Per Patient): Active Symptoms, Suicidal Ideation  Patient Stressors:    Coping Skills:   TV, Art  Leisure Interests (2+):  Crafts - Sewing(Cooking)  Frequency of Recreation/Participation: Monthly  Awareness of Community Resources:     [employer]:     Current Use:    If no, Barriers?:    Expressed Interest in [employer] Information:    [place]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

## 2019-06-25 NOTE — H&P (Signed)
Psychiatric Admission Assessment Adult  Patient Identification: [name] MRN:  [id number] Date of Evaluation:  06/25/2019 Chief Complaint:  MDD (major depressive disorder), recurrent episode, severe ([place]) [F33.2] Principal Diagnosis: MDD (major depressive disorder), recurrent episode, severe ([place]) Diagnosis:  Principal Problem:   MDD (major depressive disorder), recurrent episode, severe ([place]) Active Problems:   Essential hypertension   Hypothyroidism   Dyslipidemia   Diabetes ([place])  History of Present Illness: Patient seen and chart reviewed.  This is a 74 year old woman with a history of chronic mood problems.  Came to the hospital because her depression has been getting worse.  Probably for at least a month has been feeling tired of living.  Feels like she has no reason to live and nothing to get up for.  Very little energy.  She has lost all contact with her family.  She says that she is still taking her medicines.  Was last seen at [facility].  Patient denies current hallucinations or psychotic symptoms.  Ongoing stress as well as some more recent stress of learning that her children do not want to get in touch with her. Associated Signs/Symptoms: Depression Symptoms:  depressed mood, psychomotor retardation, difficulty concentrating, suicidal thoughts without plan, (Hypo) Manic Symptoms:  Impulsivity, Anxiety Symptoms:  Excessive Worry, Psychotic Symptoms:  Paranoia, PTSD Symptoms: Negative Total Time spent with patient: 1 hour  Past Psychiatric History: Patient has a long history of mood instability with a diagnosis of bipolar depression.  Frequently presents with similar symptoms.  Has a history of some acting out suicidal behavior in the past.  Is the patient at risk to self? Yes.    Has the patient been a risk to self in the past 6 months? Yes.    Has the patient been a risk to self within the distant past? Yes.    Is the patient a risk to others? No.  Has the  patient been a risk to others in the past 6 months? No.  Has the patient been a risk to others within the distant past? No.   Prior Inpatient Therapy:   Prior Outpatient Therapy:    Alcohol Screening: 1. How often do you have a drink containing alcohol?: Never 2. How many drinks containing alcohol do you have on a typical day when you are drinking?: 1 or 2 3. How often do you have six or more drinks on one occasion?: Never AUDIT-C Score: 0 4. How often during the last year have you found that you were not able to stop drinking once you had started?: Never 5. How often during the last year have you failed to do what was normally expected from you becasue of drinking?: Never 6. How often during the last year have you needed a first drink in the morning to get yourself going after a heavy drinking session?: Never 7. How often during the last year have you had a feeling of guilt of remorse after drinking?: Never 8. How often during the last year have you been unable to remember what happened the night before because you had been drinking?: Never 9. Have you or someone else been injured as a result of your drinking?: No 10. Has a relative or friend or a doctor or another health worker been concerned about your drinking or suggested you cut down?: No Alcohol Use Disorder Identification Test Final Score (AUDIT): 0 Alcohol Brief Interventions/Follow-up: AUDIT Score <7 follow-up not indicated Substance Abuse History in the last 12 months:  No. Consequences of Substance  Abuse: Negative Previous Psychotropic Medications: No  Psychological Evaluations: No  Past Medical History:  Past Medical History:  Diagnosis Date  . Arthritis   . Breast cancer ([place])   . Cancer [facility]) 2004   right breast ca  . Diabetes mellitus without complication ([place])   . Hypercholesterolemia   . Hypertension   . Hypothyroid   . Seasonal allergies     Past Surgical History:  Procedure Laterality Date  . ABDOMINAL  HYSTERECTOMY    . BREAST BIOPSY Left    neg  . BREAST EXCISIONAL BIOPSY Right 2004   breast ca and lumpectomy  . BREAST LUMPECTOMY Right 2004   breast ca  . CHOLECYSTECTOMY    . COLONOSCOPY WITH PROPOFOL N/A 02/13/2017   Procedure: COLONOSCOPY WITH PROPOFOL;  Surgeon: Jonathon Bellows, MD;  Location: [facility] ENDOSCOPY;  Service: Gastroenterology;  Laterality: N/A;  . KIDNEY STONE SURGERY     Family History:  Family History  Problem Relation Age of Onset  . Bladder Cancer Neg Hx   . Kidney cancer Neg Hx    Family Psychiatric  History: None reported Tobacco Screening:   Social History:  Social History   Substance and Sexual Activity  Alcohol Use No     Social History   Substance and Sexual Activity  Drug Use No    Additional Social History:                           Allergies:   Allergies  Allergen Reactions  . Navane [Thiothixene] Other (See Comments)    Reaction:  Unknown  . Penicillins Rash    Has patient had a PCN reaction causing immediate rash, facial/tongue/throat swelling, SOB or lightheadedness with hypotension: No Has patient had a PCN reaction causing severe rash involving mucus membranes or skin necrosis: No Has patient had a PCN reaction that required hospitalization: No Has patient had a PCN reaction occurring within the last 10 years: No If all of the above answers are "NO", then may proceed with Cephalosporin use.    Lab Results:  Results for orders placed or performed during the hospital encounter of 06/24/19 (from the past 48 hour(s))  Glucose, capillary     Status: Abnormal   Collection Time: 06/25/19  7:25 AM  Result Value Ref Range   Glucose-Capillary 197 (H) 70 - 99 mg/dL   Comment 1 Notify RN   Glucose, capillary     Status: Abnormal   Collection Time: 06/25/19 11:37 AM  Result Value Ref Range   Glucose-Capillary 277 (H) 70 - 99 mg/dL  Glucose, capillary     Status: Abnormal   Collection Time: 06/25/19  4:19 PM  Result Value Ref Range    Glucose-Capillary 225 (H) 70 - 99 mg/dL   Comment 1 Notify RN     Blood Alcohol level:  Lab Results  Component Value Date   ETH <10 06/22/2019   ETH <10 [id number]    Metabolic Disorder Labs:  Lab Results  Component Value Date   HGBA1C 9.6 (H) 07/21/2018   MPG 228.82 07/21/2018   MPG 240 07/07/2018   No results found for: PROLACTIN Lab Results  Component Value Date   CHOL 167 07/21/2018   TRIG 221 (H) 07/21/2018   HDL 48 07/21/2018   CHOLHDL 3.5 07/21/2018   VLDL 44 (H) 07/21/2018   LDLCALC 75 07/21/2018   LDLCALC 93 07/07/2018    Current Medications: Current Facility-Administered Medications  Medication Dose Route Frequency Provider  Last Rate Last Admin  . acetaminophen (TYLENOL) tablet 650 mg  650 mg Oral Q6H PRN Caroline Sauger, NP      . alum & mag hydroxide-simeth (MAALOX/MYLANTA) 200-200-20 MG/5ML suspension 30 mL  30 mL Oral Q4H PRN Caroline Sauger, NP      . clonazePAM Bobbye [place]) tablet 1 mg  1 mg Oral QHS Caroline Sauger, NP      . enalapril (VASOTEC) tablet 20 mg  20 mg Oral BID Caroline Sauger, NP   20 mg at 06/25/19 0829  . FLUoxetine (PROZAC) capsule 40 mg  40 mg Oral Daily Caroline Sauger, NP   40 mg at 06/25/19 0830  . glipiZIDE (GLUCOTROL XL) 24 hr tablet 10 mg  10 mg Oral Q breakfast Caroline Sauger, NP   10 mg at 06/25/19 0830  . insulin aspart (novoLOG) injection 0-5 Units  0-5 Units Subcutaneous QHS Caroline Sauger, NP      . insulin aspart (novoLOG) injection 0-9 Units  0-9 Units Subcutaneous TID WC Caroline Sauger, NP   5 Units at 06/25/19 1138  . insulin aspart (novoLOG) injection 10 Units  10 Units Subcutaneous Once Caroline Sauger, NP   Stopped at 06/24/19 2200  . levothyroxine (SYNTHROID) tablet 88 mcg  88 mcg Oral QAC breakfast Caroline Sauger, NP      . linagliptin (TRADJENTA) tablet 5 mg  5 mg Oral Daily Jihad Brownlow, Madie Reno, MD   5 mg at 06/25/19 1404  . loratadine (CLARITIN) tablet 10 mg  10 mg  Oral Daily Caroline Sauger, NP   10 mg at 06/25/19 0831  . lurasidone (LATUDA) tablet 20 mg  20 mg Oral Q supper Deasia Chiu T, MD      . magnesium hydroxide (MILK OF MAGNESIA) suspension 30 mL  30 mL Oral Daily PRN Caroline Sauger, NP      . metoprolol succinate (TOPROL-XL) 24 hr tablet 50 mg  50 mg Oral Daily Caroline Sauger, NP   50 mg at 06/25/19 0831  . rosuvastatin (CRESTOR) tablet 40 mg  40 mg Oral q1800 Caroline Sauger, NP      . traZODone (DESYREL) tablet 100 mg  100 mg Oral QHS Caroline Sauger, NP       PTA Medications: Medications Prior to Admission  Medication Sig Dispense Refill Last Dose  . clonazePAM (KLONOPIN) 1 MG tablet Take 1 tablet (1 mg total) by mouth at bedtime. 30 tablet 1   . divalproex (DEPAKOTE ER) 500 MG 24 hr tablet Take 2 tablets (1,000 mg total) by mouth at bedtime. 60 tablet 1   . enalapril (VASOTEC) 20 MG tablet Take 1 tablet (20 mg total) by mouth 2 (two) times daily. 60 tablet 1   . FLUoxetine (PROZAC) 40 MG capsule Take 1 capsule (40 mg total) by mouth daily. 30 capsule 1   . glipiZIDE (GLUCOTROL XL) 5 MG 24 hr tablet Take 2 tablets by mouth 2 (two) times daily.     Marland Kitchen JANUVIA 100 MG tablet Take 100 mg by mouth daily.      Marland Kitchen levothyroxine (SYNTHROID) 88 MCG tablet Take 88 mcg by mouth daily before breakfast.      . loratadine (CLARITIN) 10 MG tablet Take 1 tablet (10 mg total) by mouth daily. 30 tablet 1   . metoprolol succinate (TOPROL-XL) 50 MG 24 hr tablet Take 50 mg by mouth daily.     Marland Kitchen PAZEO 0.7 % SOLN Take 1 drop by mouth daily.      . rosuvastatin (CRESTOR) 40 MG tablet Take  40 mg by mouth daily.     . simvastatin (ZOCOR) 20 MG tablet Take 1 tablet (20 mg total) by mouth daily. (Patient not taking: Reported on 06/02/2019) 30 tablet 1   . traZODone (DESYREL) 100 MG tablet Take 1 tablet (100 mg total) by mouth at bedtime. (Patient not taking: Reported on 06/22/2019) 30 tablet 1     Musculoskeletal: Strength & Muscle Tone:  within normal limits Gait & Station: normal Patient leans: N/A  Psychiatric Specialty Exam: Physical Exam  Nursing note and vitals reviewed. Constitutional: She appears well-developed and well-nourished.  HENT:  Head: Normocephalic and atraumatic.  Eyes: Pupils are equal, round, and reactive to light. Conjunctivae are normal.  Cardiovascular: Regular rhythm and normal heart sounds.  Respiratory: Effort normal.  GI: Soft.  Musculoskeletal:        General: Normal range of motion.     Cervical back: Normal range of motion.  Neurological: She is alert.  Skin: Skin is warm and dry.  Psychiatric: Thought content normal. Her affect is blunt. Her speech is delayed. She is slowed. Cognition and memory are normal. She expresses impulsivity. She exhibits a depressed mood.    Review of Systems  Constitutional: Negative.   HENT: Negative.   Eyes: Negative.   Respiratory: Negative.   Cardiovascular: Negative.   Gastrointestinal: Negative.   Musculoskeletal: Negative.   Skin: Negative.   Neurological: Negative.   Psychiatric/Behavioral: Positive for dysphoric mood and suicidal ideas.    Blood pressure (!) 141/95, pulse 62, temperature 97.8 F (36.6 C), temperature source Oral, resp. rate 17, height 5\' 1"  (1.549 m), weight 78.5 kg, SpO2 95 %.Body mass index is 32.7 kg/m.  General Appearance: Casual  Eye Contact:  Fair  Speech:  Clear and Coherent  Volume:  Normal  Mood:  Euthymic  Affect:  Congruent  Thought Process:  Goal Directed  Orientation:  Full (Time, Place, and Person)  Thought Content:  Logical  Suicidal Thoughts:  No  Homicidal Thoughts:  No  Memory:  Immediate;   Fair Recent;   Fair Remote;   Fair  Judgement:  Fair  Insight:  Fair  Psychomotor Activity:  Normal  Concentration:  Concentration: Fair  Recall:  [employer] of Knowledge:  Fair  Language:  Fair  Akathisia:  No  Handed:  Right  AIMS (if indicated):     Assets:  Desire for Improvement  ADL's:  Intact   Cognition:  WNL  Sleep:  Number of Hours: 6.75    Treatment Plan Summary: Daily contact with patient to assess and evaluate symptoms and progress in treatment, Medication management and Plan Discussed medication management.  Suggested switching her over to Latuda 20 mg a day for bipolar depression.  Patient agreeable.  Side effects reviewed.  Continue inclusion in groups on the unit.  Patient's blood sugars she says at home are pretty well controlled.  They were high here today she says because she had not gotten her medication right.  Diet adjusted.  Medications adjusted.  Observation Level/Precautions:  15 minute checks  Laboratory:  Chemistry Profile  Psychotherapy:    Medications:    Consultations:    Discharge Concerns:    Estimated LOS:  Other:     Physician Treatment Plan for Primary Diagnosis: MDD (major depressive disorder), recurrent episode, severe ([place]) Long Term Goal(s): Improvement in symptoms so as ready for discharge  Short Term Goals: Ability to verbalize feelings will improve, Ability to disclose and discuss suicidal ideas and Ability to demonstrate self-control will  improve  Physician Treatment Plan for Secondary Diagnosis: Principal Problem:   MDD (major depressive disorder), recurrent episode, severe ([place]) Active Problems:   Essential hypertension   Hypothyroidism   Dyslipidemia   Diabetes ([place])  Long Term Goal(s): Improvement in symptoms so as ready for discharge  Short Term Goals: Ability to maintain clinical measurements within normal limits will improve and Compliance with prescribed medications will improve  I certify that inpatient services furnished can reasonably be expected to improve the patient's condition.    Alethia Berthold, MD 2/5/20214:23 PM

## 2019-06-25 NOTE — Plan of Care (Signed)
  Problem: Education: Goal: Emotional status will improve Outcome: Progressing Goal: Mental status will improve Outcome: Progressing Goal: Verbalization of understanding the information provided will improve Outcome: Progressing   Problem: Safety: Goal: Periods of time without injury will increase Outcome: Progressing   

## 2019-06-25 NOTE — Progress Notes (Signed)
Admission Note:  74 yr female who presents IVC in no acute distress for the treatment of SI and Depression. Patient appears flat and sad she appeared tired, she was calm and cooperative with admission process. Patient denies SI/HI/AVH and contracts for safety upon admission.  Patient explained she was experiencing worsening depression , loneliness, hopelessness and sadness. Patient explained she experienced sadness when shown pictures of her estranged children. She went to [place] and stated " I don't want to live anymore "  Patient has Past medical Hx of Arthritis, Breast Cancer; right breast, Bipolar  Disorder, DM without complication, HTN, hypercholesteremia, Hyperthyroid, and seasonal allergies  Skin was assessed in presence of  [place] MHT and found to be clear of any abnormal marks, skin warm and dry, she was also  searched and no contraband found, POC and unit policies explained and understanding verbalized. Consents was obtained. 15 minutes safety checks maintained will continue to monitor.
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
## 2019-06-25 NOTE — Plan of Care (Signed)
Patient newly admitted, hasn't had time to progress  Problem: Education: Goal: Knowledge of [facility] Education information/materials will improve Outcome: Not Progressing Goal: Emotional status will improve Outcome: Not Progressing Goal: Mental status will improve Outcome: Not Progressing Goal: Verbalization of understanding the information provided will improve Outcome: Not Progressing   Problem: Safety: Goal: Periods of time without injury will increase Outcome: Not Progressing   Problem: Education: Goal: Utilization of techniques to improve thought processes will improve Outcome: Not Progressing Goal: Knowledge of the prescribed therapeutic regimen will improve Outcome: Not Progressing   Problem: Safety: Goal: Ability to disclose and discuss suicidal ideas will improve Outcome: Not Progressing

## 2019-06-25 NOTE — Progress Notes (Signed)
Recreation Therapy Notes  Date: 06/25/2019  Time: 9:30 am   Location: Craft room   Behavioral response: N/A   Intervention Topic: Leisure  Discussion/Intervention: Patient did not attend group.   Clinical Observations/Feedback:  Patient did not attend group.   Aamilah Augenstein LRT/CTRS         Loriene Taunton 06/25/2019 12:01 PM

## 2019-06-25 NOTE — BHH Suicide Risk Assessment (Signed)
[place] INPATIENT:  Family/Significant Other Suicide Prevention Education  Suicide Prevention Education:  Patient Refusal for Family/Significant Other Suicide Prevention Education: The patient [name] has refused to provide written consent for family/significant other to be provided Family/Significant Other Suicide Prevention Education during admission and/or prior to discharge.  Physician notified.  SPE completed with pt, as pt refused to consent to family contact. SPI pamphlet provided to pt and pt was encouraged to share information with support network, ask questions, and talk about any concerns relating to SPE. Pt denies access to guns/firearms and verbalized understanding of information provided. Mobile Crisis information also provided to pt.   Rozann Lesches 06/25/2019, 10:03 AM

## 2019-06-25 NOTE — BHH Suicide Risk Assessment (Signed)
[facility] Admission Suicide Risk Assessment   Nursing information obtained from:  Patient Demographic factors:  Age 74 or older, Living alone, Divorced or widowed Current Mental Status:  NA(Passisve SI, contracts for safety verbally) Loss Factors:  Loss of significant relationship Historical Factors:  Impulsivity Risk Reduction Factors:  NA  Total Time spent with patient: 1 hour Principal Problem: MDD (major depressive disorder), recurrent episode, severe ([place]) Diagnosis:  Principal Problem:   MDD (major depressive disorder), recurrent episode, severe ([place]) Active Problems:   Essential hypertension   Hypothyroidism   Dyslipidemia   Diabetes ([place])  Subjective Data: Patient seen chart reviewed.  74 year old woman with a history of recurrent depression and mood swings comes into the hospital with worsening depressed mood and suicidal ideation.  Currently calm and cooperative with no intention of acting on suicidal behavior in the hospital.  Continued Clinical Symptoms:  Alcohol Use Disorder Identification Test Final Score (AUDIT): 0 The "Alcohol Use Disorders Identification Test", Guidelines for Use in Primary Care, Second Edition.  World Pharmacologist [facility]). Score between 0-7:  no or low risk or alcohol related problems. Score between 8-15:  moderate risk of alcohol related problems. Score between 16-19:  high risk of alcohol related problems. Score 20 or above:  warrants further diagnostic evaluation for alcohol dependence and treatment.   CLINICAL FACTORS:   Depression:   Hopelessness   Musculoskeletal: Strength & Muscle Tone: within normal limits Gait & Station: normal Patient leans: N/A  Psychiatric Specialty Exam: Physical Exam  Nursing note and vitals reviewed. Constitutional: She appears well-developed and well-nourished.  HENT:  Head: Normocephalic and atraumatic.  Eyes: Pupils are equal, round, and reactive to light. Conjunctivae are normal.  Cardiovascular:  Regular rhythm and normal heart sounds.  Respiratory: Effort normal. No respiratory distress.  GI: Soft.  Musculoskeletal:        General: Normal range of motion.     Cervical back: Normal range of motion.  Neurological: She is alert.  Skin: Skin is warm and dry.  Psychiatric: Her affect is blunt. Her speech is delayed. She is slowed. Cognition and memory are normal. She expresses impulsivity. She exhibits a depressed mood. She expresses suicidal ideation. She expresses no homicidal ideation. She expresses no suicidal plans.    Review of Systems  Constitutional: Negative.   HENT: Negative.   Eyes: Negative.   Respiratory: Negative.   Cardiovascular: Negative.   Gastrointestinal: Negative.   Musculoskeletal: Negative.   Skin: Negative.   Neurological: Negative.   Psychiatric/Behavioral: Positive for dysphoric mood and suicidal ideas.    Blood pressure (!) 141/95, pulse 62, temperature 97.8 F (36.6 C), temperature source Oral, resp. rate 17, height 5\' 1"  (1.549 m), weight 78.5 kg, SpO2 95 %.Body mass index is 32.7 kg/m.  General Appearance: Casual  Eye Contact:  Good  Speech:  Clear and Coherent  Volume:  Normal  Mood:  Depressed  Affect:  Blunt  Thought Process:  Coherent  Orientation:  Full (Time, Place, and Person)  Thought Content:  Logical  Suicidal Thoughts:  Yes.  with intent/plan  Homicidal Thoughts:  No  Memory:  Immediate;   Fair Recent;   Fair Remote;   Fair  Judgement:  Fair  Insight:  Fair  Psychomotor Activity:  Normal  Concentration:  Concentration: Fair  Recall:  [employer] of Knowledge:  Fair  Language:  Fair  Akathisia:  No  Handed:  Right  AIMS (if indicated):     Assets:  Desire for [place]  Social Support  ADL's:  Impaired  Cognition:  Impaired,  Mild  Sleep:  Number of Hours: 6.75      COGNITIVE FEATURES THAT CONTRIBUTE TO RISK:  Polarized thinking    SUICIDE RISK:   Mild:  Suicidal ideation of limited  frequency, intensity, duration, and specificity.  There are no identifiable plans, no associated intent, mild dysphoria and related symptoms, good self-control (both objective and subjective assessment), few other risk factors, and identifiable protective factors, including available and accessible social support.  PLAN OF CARE: Continue 15-minute checks.  Continue current medication.  Some adjustment to medicine for bipolar depression adding Latuda.  Ongoing assessment of dangerousness before discharge planning  I certify that inpatient services furnished can reasonably be expected to improve the patient's condition.   Alethia Berthold, MD 06/25/2019, 4:19 PM

## 2019-06-26 DIAGNOSIS — F332 Major depressive disorder, recurrent severe without psychotic features: Secondary | ICD-10-CM

## 2019-06-26 LAB — GLUCOSE, CAPILLARY
Glucose-Capillary: 194 mg/dL — ABNORMAL HIGH (ref 70–99)
Glucose-Capillary: 294 mg/dL — ABNORMAL HIGH (ref 70–99)
Glucose-Capillary: 211 mg/dL — ABNORMAL HIGH (ref 70–99)
Glucose-Capillary: 246 mg/dL — ABNORMAL HIGH (ref 70–99)

## 2019-06-26 MED ORDER — ENALAPRIL MALEATE 5 MG PO TABS
5.0000 mg | ORAL_TABLET | Freq: Two times a day (BID) | ORAL | Status: DC
  Administered 2019-06-27 – 2019-06-29 (×4): 5 mg via ORAL
  Filled 2019-06-26 (×7): qty 1

## 2019-06-26 NOTE — Progress Notes (Signed)
Pt expressed that this is the best day she has had since she has been here. She has been reading today, went to group and being social . Collier Bullock RN

## 2019-06-26 NOTE — Progress Notes (Signed)
Patient alert and oriented x 4, affect is blunted,  she denies SI/HI/AVH, her thoughts are organized and coherent, her gait is steady, she was noted interacting appropriately with peers and staff, she was complaint with medication, speech is soft non tangential she rated depression a 5/10 ( 0 low - 10 high ) Patient was offered support and encouragement, 15 minutes safety checks maintained will continue to monitor.

## 2019-06-26 NOTE — Progress Notes (Signed)
[name] Progress Note  06/26/2019 2:52 PM [name]  MRN:  [id number] Subjective:    [name] is 74 years of age she has a recurrent severe depression, was admitted to the seriousness of that depression related dangerousness.  She elaborated individually and in treatment team she is "wanting to get better wanted to pull out of" her depression. Does engage in cognitive therapy No side effects from meds Risk-benefit side effects discussed Seems motivated for improvement understands what it means to contract for safety.  Passive suicidal thoughts no active suicidal plans Principal Problem: MDD (major depressive disorder), recurrent episode, severe ([place]) Diagnosis: Principal Problem:   MDD (major depressive disorder), recurrent episode, severe ([place]) Active Problems:   Essential hypertension   Hypothyroidism   Dyslipidemia   Diabetes ([place])  Total Time spent with patient: 20 minutes  Past Psychiatric History: See eval  Past Medical History:  Past Medical History:  Diagnosis Date  . Arthritis   . Breast cancer ([place])   . Cancer [facility]) 2004   right breast ca  . Diabetes mellitus without complication ([place])   . Hypercholesterolemia   . Hypertension   . Hypothyroid   . Seasonal allergies     Past Surgical History:  Procedure Laterality Date  . ABDOMINAL HYSTERECTOMY    . BREAST BIOPSY Left    neg  . BREAST EXCISIONAL BIOPSY Right 2004   breast ca and lumpectomy  . BREAST LUMPECTOMY Right 2004   breast ca  . CHOLECYSTECTOMY    . COLONOSCOPY WITH PROPOFOL N/A 02/13/2017   Procedure: COLONOSCOPY WITH PROPOFOL;  Surgeon: Jonathon Bellows, MD;  Location: [facility] ENDOSCOPY;  Service: Gastroenterology;  Laterality: N/A;  . KIDNEY STONE SURGERY     Family History:  Family History  Problem Relation Age of Onset  . Bladder Cancer Neg Hx   . Kidney cancer Neg Hx    Family Psychiatric  History: See eval Social History:  Social History   Substance and Sexual Activity  Alcohol Use No      Social History   Substance and Sexual Activity  Drug Use No    Social History   Socioeconomic History  . Marital status: Divorced    Spouse name: Not on file  . Number of children: Not on file  . Years of education: Not on file  . Highest education level: Not on file  Occupational History  . Not on file  Tobacco Use  . Smoking status: Former Smoker    Types: Cigarettes  . Smokeless tobacco: Never Used  Substance and Sexual Activity  . Alcohol use: No  . Drug use: No  . Sexual activity: Not Currently  Other Topics Concern  . Not on file  Social History Narrative  . Not on file   Social Determinants of Health   Financial Resource Strain:   . Difficulty of Paying Living Expenses: Not on file  Food Insecurity:   . Worried About Charity fundraiser in the Last Year: Not on file  . Ran Out of Food in the Last Year: Not on file  Transportation Needs:   . Lack of Transportation (Medical): Not on file  . Lack of Transportation (Non-Medical): Not on file  Physical Activity:   . Days of Exercise per Week: Not on file  . Minutes of Exercise per Session: Not on file  Stress:   . Feeling of Stress : Not on file  Social Connections:   . Frequency of Communication with Friends and Family: Not on  file  . Frequency of Social Gatherings with Friends and Family: Not on file  . Attends Religious Services: Not on file  . Active Member of Clubs or Organizations: Not on file  . Attends Archivist Meetings: Not on file  . Marital Status: Not on file   Additional Social History:                         Sleep: Fair  Appetite:  Fair  Current Medications: Current Facility-Administered Medications  Medication Dose Route Frequency Provider Last Rate Last Admin  . acetaminophen (TYLENOL) tablet 650 mg  650 mg Oral Q6H PRN Caroline Sauger, NP      . alum & mag hydroxide-simeth (MAALOX/MYLANTA) 200-200-20 MG/5ML suspension 30 mL  30 mL Oral Q4H PRN Caroline Sauger, NP      . clonazePAM Bobbye [place]) tablet 1 mg  1 mg Oral QHS Caroline Sauger, NP      . enalapril (VASOTEC) tablet 5 mg  5 mg Oral BID Johnn Hai, MD      . FLUoxetine (PROZAC) capsule 40 mg  40 mg Oral Daily Caroline Sauger, NP   40 mg at 06/26/19 0757  . glipiZIDE (GLUCOTROL XL) 24 hr tablet 10 mg  10 mg Oral Q breakfast Caroline Sauger, NP   10 mg at 06/26/19 0757  . insulin aspart (novoLOG) injection 0-5 Units  0-5 Units Subcutaneous QHS Caroline Sauger, NP      . insulin aspart (novoLOG) injection 0-9 Units  0-9 Units Subcutaneous TID WC Caroline Sauger, NP   5 Units at 06/26/19 1114  . insulin aspart (novoLOG) injection 10 Units  10 Units Subcutaneous Once Caroline Sauger, NP   Stopped at 06/24/19 2200  . levothyroxine (SYNTHROID) tablet 88 mcg  88 mcg Oral QAC breakfast Caroline Sauger, NP   88 mcg at 06/26/19 0700  . linagliptin (TRADJENTA) tablet 5 mg  5 mg Oral Daily Clapacs, Madie Reno, MD   5 mg at 06/26/19 0757  . loratadine (CLARITIN) tablet 10 mg  10 mg Oral Daily Caroline Sauger, NP   10 mg at 06/26/19 0756  . lurasidone (LATUDA) tablet 20 mg  20 mg Oral Q supper Clapacs, Madie Reno, MD   20 mg at 06/25/19 1720  . magnesium hydroxide (MILK OF MAGNESIA) suspension 30 mL  30 mL Oral Daily PRN Caroline Sauger, NP      . rosuvastatin (CRESTOR) tablet 40 mg  40 mg Oral q1800 Caroline Sauger, NP   40 mg at 06/25/19 1720  . traZODone (DESYREL) tablet 100 mg  100 mg Oral QHS Caroline Sauger, NP        Lab Results:  Results for orders placed or performed during the hospital encounter of 06/24/19 (from the past 48 hour(s))  Glucose, capillary     Status: Abnormal   Collection Time: 06/25/19  7:25 AM  Result Value Ref Range   Glucose-Capillary 197 (H) 70 - 99 mg/dL   Comment 1 Notify RN   Glucose, capillary     Status: Abnormal   Collection Time: 06/25/19 11:37 AM  Result Value Ref Range   Glucose-Capillary 277 (H) 70 - 99  mg/dL  Glucose, capillary     Status: Abnormal   Collection Time: 06/25/19  4:19 PM  Result Value Ref Range   Glucose-Capillary 225 (H) 70 - 99 mg/dL   Comment 1 Notify RN   Glucose, capillary     Status: Abnormal   Collection Time: 06/26/19  7:04 AM  Result Value Ref Range   Glucose-Capillary 194 (H) 70 - 99 mg/dL  Glucose, capillary     Status: Abnormal   Collection Time: 06/26/19 11:08 AM  Result Value Ref Range   Glucose-Capillary 294 (H) 70 - 99 mg/dL    Blood Alcohol level:  Lab Results  Component Value Date   ETH <10 06/22/2019   ETH <10 [id number]    Metabolic Disorder Labs: Lab Results  Component Value Date   HGBA1C 9.6 (H) 07/21/2018   MPG 228.82 07/21/2018   MPG 240 07/07/2018   No results found for: PROLACTIN Lab Results  Component Value Date   CHOL 167 07/21/2018   TRIG 221 (H) 07/21/2018   HDL 48 07/21/2018   CHOLHDL 3.5 07/21/2018   VLDL 44 (H) 07/21/2018   LDLCALC 75 07/21/2018   LDLCALC 93 07/07/2018    Physical Findings: AIMS:  , ,  ,  ,    CIWA:    COWS:     Musculoskeletal: Strength & Muscle Tone: within normal limits Gait & Station: normal Patient leans: N/A  Psychiatric Specialty Exam: Physical Exam  Review of Systems  Blood pressure (!) 103/92, pulse (!) 55, temperature 97.9 F (36.6 C), temperature source Oral, resp. rate 17, height 5\' 1"  (1.549 m), weight 78.5 kg, SpO2 96 %.Body mass index is 32.7 kg/m.  General Appearance: Casual  Eye Contact:  Good  Speech:  Clear and Coherent  Volume:  Normal  Mood:  Anxious and Dysphoric  Affect:  Appropriate and Congruent  Thought Process:  Coherent and Goal Directed  Orientation:  Full (Time, Place, and Person)  Thought Content:  Logical  Suicidal Thoughts:  No  Homicidal Thoughts:  No  Memory:  Immediate;   Fair Recent;   Good Remote;   Good  Judgement:  Good  Insight:  Good  Psychomotor Activity:  Normal  Concentration:  Concentration: Good and Attention Span: Good  Recall:   Good  Fund of Knowledge:  Good  Language:  Good  Akathisia:  Negative  Handed:  Right  AIMS (if indicated):     Assets:  Communication Skills Desire for Improvement Financial Resources/Insurance  ADL's:  Intact  Cognition:  WNL  Sleep:  Number of Hours: 8.25     Treatment Plan Summary: Daily contact with patient to assess and evaluate symptoms and progress in treatment and Medication management  To new fluoxetine with lurasidone augmentation Monitor for manic symptoms Continue cognitive therapy Continue clonazepam for insomnia Patient engages well in therapy seems motivated no change in precautions understands what it means to contract for safety  Johnn Hai, MD 06/26/2019, 2:52 PM

## 2019-06-26 NOTE — Plan of Care (Signed)
Pt says she has depression with passive SI with no specific plan. Pt contracts for safety verbally. Pt denies anxiety, SI, HI and AVH. Pt was educated on care plan and verbalizes understanding. Collier Bullock RN Problem: Education: Goal: Knowledge of [facility] Education information/materials will improve Outcome: Progressing Goal: Emotional status will improve Outcome: Progressing Goal: Mental status will improve Outcome: Progressing Goal: Verbalization of understanding the information provided will improve Outcome: Progressing   Problem: Safety: Goal: Periods of time without injury will increase Outcome: Progressing   Problem: Education: Goal: Utilization of techniques to improve thought processes will improve Outcome: Progressing Goal: Knowledge of the prescribed therapeutic regimen will improve Outcome: Progressing

## 2019-06-26 NOTE — Plan of Care (Signed)
  Problem: Education: Goal: Emotional status will improve Outcome: Progressing  Patient appears less anxious , she was noted interacting and socializing appropriately with peers.

## 2019-06-26 NOTE — BHH Group Notes (Signed)
LCSW Group Therapy Notes  Date and Time: 06/26/2019 1:00pm  Type of Therapy and Topic: Group Therapy: Healthy Vs. Unhealthy Coping Strategies  Participation Level: B[facility] PARTICIPATION LEVEL: Active  Description of Group:  In this group, patients will be encouraged to explore their healthy and unhealthy coping strategics. Coping strategies are actions that we take to deal with stress, problems, or uncomfortable emotions in our daily lives. Each patient will be challenged to read some scenarios and discuss the unhealthy and healthy coping strategies within those scenarios. Also, each patient will be challenged to describe current healthy and unhealthy strategies that they use in their own lives and discuss the outcomes and barriers to those strategies. This group will be process-oriented, with patients participating in exploration of their own experiences as well as giving and receiving support and challenge from other group members.  Therapeutic Goals: 1. Patient will identify personal healthy and unhealthy coping strategies. 2. Patient will identify healthy and unhealthy coping strategies, in others, through scenarios.  3. Patient will identify expected outcomes of healthy and unhealthy coping strategies. 4. Patient will identify barriers to using healthy coping strategies.   Summary of Patient Progress:  Pt was shared that she is feeling "really bad, because I am old and nobody is around". Patient identified healthy coping strategies to help with her loneliness like exercising, doing puzzles, and listening to the radio.    Therapeutic Modalities:  Cognitive Behavioral Therapy Solution Focused Therapy Motivational Interviewing   Juanetta Beets, MSW, [place] Social Worker

## 2019-06-26 NOTE — Tx Team (Addendum)
Interdisciplinary Treatment and Diagnostic Plan Update  06/26/2019 Time of Session: 9:15am Seleste Jakeway MRN: [id number]  Principal Diagnosis: MDD (major depressive disorder), recurrent episode, severe ([place])  Secondary Diagnoses: Principal Problem:   MDD (major depressive disorder), recurrent episode, severe ([place]) Active Problems:   Essential hypertension   Hypothyroidism   Dyslipidemia   Diabetes ([place])   Current Medications:  Current Facility-Administered Medications  Medication Dose Route Frequency Provider Last Rate Last Admin  . acetaminophen (TYLENOL) tablet 650 mg  650 mg Oral Q6H PRN Caroline Sauger, NP      . alum & mag hydroxide-simeth (MAALOX/MYLANTA) 200-200-20 MG/5ML suspension 30 mL  30 mL Oral Q4H PRN Caroline Sauger, NP      . clonazePAM Bobbye Charleston) tablet 1 mg  1 mg Oral QHS Caroline Sauger, NP      . enalapril (VASOTEC) tablet 5 mg  5 mg Oral BID Johnn Hai, MD      . FLUoxetine (PROZAC) capsule 40 mg  40 mg Oral Daily Caroline Sauger, NP   40 mg at 06/26/19 0757  . glipiZIDE (GLUCOTROL XL) 24 hr tablet 10 mg  10 mg Oral Q breakfast Caroline Sauger, NP   10 mg at 06/26/19 0757  . insulin aspart (novoLOG) injection 0-5 Units  0-5 Units Subcutaneous QHS Caroline Sauger, NP      . insulin aspart (novoLOG) injection 0-9 Units  0-9 Units Subcutaneous TID WC Caroline Sauger, NP   2 Units at 06/26/19 [phone]  . insulin aspart (novoLOG) injection 10 Units  10 Units Subcutaneous Once Caroline Sauger, NP   Stopped at 06/24/19 2200  . levothyroxine (SYNTHROID) tablet 88 mcg  88 mcg Oral QAC breakfast Caroline Sauger, NP   88 mcg at 06/26/19 0700  . linagliptin (TRADJENTA) tablet 5 mg  5 mg Oral Daily Clapacs, Madie Reno, MD   5 mg at 06/26/19 0757  . loratadine (CLARITIN) tablet 10 mg  10 mg Oral Daily Caroline Sauger, NP   10 mg at 06/26/19 0756  . lurasidone (LATUDA) tablet 20 mg  20 mg Oral Q supper Clapacs, Madie Reno, MD   20  mg at 06/25/19 1720  . magnesium hydroxide (MILK OF MAGNESIA) suspension 30 mL  30 mL Oral Daily PRN Caroline Sauger, NP      . rosuvastatin (CRESTOR) tablet 40 mg  40 mg Oral q1800 Caroline Sauger, NP   40 mg at 06/25/19 1720  . traZODone (DESYREL) tablet 100 mg  100 mg Oral QHS Caroline Sauger, NP       PTA Medications: Medications Prior to Admission  Medication Sig Dispense Refill Last Dose  . clonazePAM (KLONOPIN) 1 MG tablet Take 1 tablet (1 mg total) by mouth at bedtime. 30 tablet 1   . divalproex (DEPAKOTE ER) 500 MG 24 hr tablet Take 2 tablets (1,000 mg total) by mouth at bedtime. 60 tablet 1   . enalapril (VASOTEC) 20 MG tablet Take 1 tablet (20 mg total) by mouth 2 (two) times daily. 60 tablet 1   . FLUoxetine (PROZAC) 40 MG capsule Take 1 capsule (40 mg total) by mouth daily. 30 capsule 1   . glipiZIDE (GLUCOTROL XL) 5 MG 24 hr tablet Take 2 tablets by mouth 2 (two) times daily.     Marland Kitchen JANUVIA 100 MG tablet Take 100 mg by mouth daily.      Marland Kitchen levothyroxine (SYNTHROID) 88 MCG tablet Take 88 mcg by mouth daily before breakfast.      . loratadine (CLARITIN) 10 MG tablet Take 1  tablet (10 mg total) by mouth daily. 30 tablet 1   . metoprolol succinate (TOPROL-XL) 50 MG 24 hr tablet Take 50 mg by mouth daily.     Marland Kitchen PAZEO 0.7 % SOLN Take 1 drop by mouth daily.      . rosuvastatin (CRESTOR) 40 MG tablet Take 40 mg by mouth daily.     . simvastatin (ZOCOR) 20 MG tablet Take 1 tablet (20 mg total) by mouth daily. (Patient not taking: Reported on 06/02/2019) 30 tablet 1   . traZODone (DESYREL) 100 MG tablet Take 1 tablet (100 mg total) by mouth at bedtime. (Patient not taking: Reported on 06/22/2019) 30 tablet 1     Patient Stressors: Health problems Marital or family conflict Medication change or noncompliance  Patient Strengths: Ability for insight Average or above average intelligence Communication skills Motivation for treatment/growth  Treatment Modalities: Medication  Management, Group therapy, Case management,  1 to 1 session with clinician, Psychoeducation, Recreational therapy.   Physician Treatment Plan for Primary Diagnosis: MDD (major depressive disorder), recurrent episode, severe ([place]) Long Term Goal(s): Improvement in symptoms so as ready for discharge Improvement in symptoms so as ready for discharge   Short Term Goals: Ability to verbalize feelings will improve Ability to disclose and discuss suicidal ideas Ability to demonstrate self-control will improve Ability to maintain clinical measurements within normal limits will improve Compliance with prescribed medications will improve  Medication Management: Evaluate patient's response, side effects, and tolerance of medication regimen.  Therapeutic Interventions: 1 to 1 sessions, Unit Group sessions and Medication administration.  Evaluation of Outcomes: Progressing  Physician Treatment Plan for Secondary Diagnosis: Principal Problem:   MDD (major depressive disorder), recurrent episode, severe ([place]) Active Problems:   Essential hypertension   Hypothyroidism   Dyslipidemia   Diabetes ([place])  Long Term Goal(s): Improvement in symptoms so as ready for discharge Improvement in symptoms so as ready for discharge   Short Term Goals: Ability to verbalize feelings will improve Ability to disclose and discuss suicidal ideas Ability to demonstrate self-control will improve Ability to maintain clinical measurements within normal limits will improve Compliance with prescribed medications will improve     Medication Management: Evaluate patient's response, side effects, and tolerance of medication regimen.  Therapeutic Interventions: 1 to 1 sessions, Unit Group sessions and Medication administration.  Evaluation of Outcomes: Progressing   RN Treatment Plan for Primary Diagnosis: MDD (major depressive disorder), recurrent episode, severe ([place]) Long Term Goal(s): Knowledge of disease and  therapeutic regimen to maintain health will improve  Short Term Goals: Ability to remain free from injury will improve, Ability to verbalize frustration and anger appropriately will improve, Ability to demonstrate self-control, Ability to participate in decision making will improve, Ability to verbalize feelings will improve, Ability to disclose and discuss suicidal ideas, Ability to identify and develop effective coping behaviors will improve and Compliance with prescribed medications will improve  Medication Management: RN will administer medications as ordered by provider, will assess and evaluate patient's response and provide education to patient for prescribed medication. RN will report any adverse and/or side effects to prescribing provider.  Therapeutic Interventions: 1 on 1 counseling sessions, Psychoeducation, Medication administration, Evaluate responses to treatment, Monitor vital signs and CBGs as ordered, Perform/monitor CIWA, COWS, AIMS and Fall Risk screenings as ordered, Perform wound care treatments as ordered.  Evaluation of Outcomes: Progressing   LCSW Treatment Plan for Primary Diagnosis: MDD (major depressive disorder), recurrent episode, severe ([place]) Long Term Goal(s): Safe transition to appropriate next level of  care at discharge, Engage patient in therapeutic group addressing interpersonal concerns.  Short Term Goals: Engage patient in aftercare planning with referrals and resources, Increase social support, Increase ability to appropriately verbalize feelings, Increase emotional regulation, Facilitate acceptance of mental health diagnosis and concerns, Identify triggers associated with mental health/substance abuse issues and Increase skills for wellness and recovery  Therapeutic Interventions: Assess for all discharge needs, 1 to 1 time with Social worker, Explore available resources and support systems, Assess for adequacy in community support network, Educate family and  significant other(s) on suicide prevention, Complete Psychosocial Assessment, Interpersonal group therapy.  Evaluation of Outcomes: Progressing   Progress in Treatment: Attending groups: Yes. Participating in groups: Yes. Taking medication as prescribed: Yes. Toleration medication: Yes. Family/Significant other contact made: No, will contact:  Pt will provide consent Patient understands diagnosis: Yes. Discussing patient identified problems/goals with staff: Yes. Medical problems stabilized or resolved: Yes. Denies suicidal/homicidal ideation: Yes. Issues/concerns per patient self-inventory: No. Other: None  New problem(s) identified: No, Describe:  None  New Short Term/Long Term Goal(s):  Patient Goals:  "Get out of this place and stop being depressed all of the time"  Discharge Plan or Barriers: Pt will return home and participate in outpatient services.  Reason for Continuation of Hospitalization: Depression Medication stabilization  Estimated Length of Stay: 3-5 days  Attendees: Patient: [name] 06/26/2019 9:44 AM  Physician: Dr. Jake Samples 06/26/2019 9:44 AM  Nursing: Polly Cobia, RN 06/26/2019 9:44 AM  RN Care Manager: 06/26/2019 9:44 AM  Social Worker: Juanetta Beets, [place] 06/26/2019 9:44 AM  Recreational Therapist:  06/26/2019 9:44 AM  Other: Netta Neat, LCSW 06/26/2019 9:44 AM  Other:  06/26/2019 9:44 AM  Other: 06/26/2019 9:44 AM    Scribe for Treatment Team: Charlott Rakes, LCSWA 06/26/2019 10:09 AM

## 2019-06-27 LAB — GLUCOSE, CAPILLARY
Glucose-Capillary: 261 mg/dL — ABNORMAL HIGH (ref 70–99)
Glucose-Capillary: 306 mg/dL — ABNORMAL HIGH (ref 70–99)
Glucose-Capillary: 232 mg/dL — ABNORMAL HIGH (ref 70–99)
Glucose-Capillary: 321 mg/dL — ABNORMAL HIGH (ref 70–99)

## 2019-06-27 NOTE — Plan of Care (Signed)
  Problem: Education: Goal: Knowledge of [facility] Education information/materials will improve Outcome: Progressing Goal: Emotional status will improve Outcome: Progressing Goal: Mental status will improve Outcome: Progressing Goal: Verbalization of understanding the information provided will improve Outcome: Progressing  D: Patient has been calm and cooperative A: Continue to monitor R: Safety maintained

## 2019-06-27 NOTE — [facility] Group Notes (Signed)
LCSW Group Therapy Notes   Date and Time: 06/27/2019 1:00pm  Type of Therapy and Topic: Group Therapy: Worry and Anxiety  Participation Level: [facility] PARTICIPATION LEVEL: Active  Description of Group: In this group, patients will be encouraged to explore their worry around what could happen vs what will happen. Each patient will be challenged to think of personal worries and how they will work their way through that worry around what will happen and what could happen. This group will be process-oriented, with patients participating in exploration of their own experiences as well as giving and receiving support and challenge from other group members.  Therapeutic Goals: 1. Patient will identify personal worries that cause anxiety. 2. Patient will identify clues to identify their worry. 3. Patient will identify ways to handle their worry. 4. Patient will discuss ways that their worry has deceased or why it has not decreased.   Summary of Patient Progress:  The patient described her current feelings as "I feel better today because I feel like I am ready to go home". The patient was able to identify a worry and worked with the group on how to decrease her concerns. The patient plans to practice using her voice to advocate for herself and to aid in her anxious thoughts.   Therapeutic Modalities:  Cognitive Behavioral Therapy Solution Focused Therapy Motivational Interviewing    Juanetta Beets, MSW, [place] Social Worker

## 2019-06-27 NOTE — Progress Notes (Signed)
Patient found sitting on the floor. Stated she did not fall,. Full ROM. No signs of injury or complaint of pain.

## 2019-06-27 NOTE — Progress Notes (Signed)
[facility] MD Progress Note  06/27/2019 11:24 AM [place]  MRN:  [id number] Subjective:    Patient reports overall improvement in her mood believes she is stable and baseline, euthymic.  No thoughts of harming self or others  No involuntary movements sleep is preserved.  No change in medications requested.  Participates well in cognitive therapy.  Participates well in groups Principal Problem: MDD (major depressive disorder), recurrent episode, severe ([place]) Diagnosis: Principal Problem:   MDD (major depressive disorder), recurrent episode, severe ([place]) Active Problems:   Essential hypertension   Hypothyroidism   Dyslipidemia   Diabetes ([place])  Total Time spent with patient: 20 minutes  Past Psychiatric History: See eval  Past Medical History:  Past Medical History:  Diagnosis Date  . Arthritis   . Breast cancer ([place])   . Cancer [facility]) 2004   right breast ca  . Diabetes mellitus without complication ([place])   . Hypercholesterolemia   . Hypertension   . Hypothyroid   . Seasonal allergies     Past Surgical History:  Procedure Laterality Date  . ABDOMINAL HYSTERECTOMY    . BREAST BIOPSY Left    neg  . BREAST EXCISIONAL BIOPSY Right 2004   breast ca and lumpectomy  . BREAST LUMPECTOMY Right 2004   breast ca  . CHOLECYSTECTOMY    . COLONOSCOPY WITH PROPOFOL N/A 02/13/2017   Procedure: COLONOSCOPY WITH PROPOFOL;  Surgeon: Jonathon Bellows, MD;  Location[facility] ENDOSCOPY;  Service: Gastroenterology;  Laterality: N/A;  . KIDNEY STONE SURGERY     Family History:  Family History  Problem Relation Age of Onset  . Bladder Cancer Neg Hx   . Kidney cancer Neg Hx    Family Psychiatric  History: See eval Social History:  Social History   Substance and Sexual Activity  Alcohol Use No     Social History   Substance and Sexual Activity  Drug Use No    Social History   Socioeconomic History  . Marital status: Divorced    Spouse name: Not on file  . Number of children:  Not on file  . Years of education: Not on file  . Highest education level: Not on file  Occupational History  . Not on file  Tobacco Use  . Smoking status: Former Smoker    Types: Cigarettes  . Smokeless tobacco: Never Used  Substance and Sexual Activity  . Alcohol use: No  . Drug use: No  . Sexual activity: Not Currently  Other Topics Concern  . Not on file  Social History Narrative  . Not on file   Social Determinants of Health   Financial Resource Strain:   . Difficulty of Paying Living Expenses: Not on file  Food Insecurity:   . Worried About Charity fundraiser in the Last Year: Not on file  . Ran Out of Food in the Last Year: Not on file  Transportation Needs:   . Lack of Transportation (Medical): Not on file  . Lack of Transportation (Non-Medical): Not on file  Physical Activity:   . Days of Exercise per Week: Not on file  . Minutes of Exercise per Session: Not on file  Stress:   . Feeling of Stress : Not on file  Social Connections:   . Frequency of Communication with Friends and Family: Not on file  . Frequency of Social Gatherings with Friends and Family: Not on file  . Attends Religious Services: Not on file  . Active Member of Clubs or Organizations: Not  on file  . Attends Archivist Meetings: Not on file  . Marital Status: Not on file   Additional Social History:                         Sleep: Good  Appetite:  Good  Current Medications: Current Facility-Administered Medications  Medication Dose Route Frequency Provider Last Rate Last Admin  . acetaminophen (TYLENOL) tablet 650 mg  650 mg Oral Q6H PRN Caroline Sauger, NP      . alum & mag hydroxide-simeth (MAALOX/MYLANTA) 200-200-20 MG/5ML suspension 30 mL  30 mL Oral Q4H PRN Caroline Sauger, NP      . clonazePAM Bobbye Charleston) tablet 1 mg  1 mg Oral QHS Caroline Sauger, NP   1 mg at 06/26/19 2102  . enalapril (VASOTEC) tablet 5 mg  5 mg Oral BID Johnn Hai, MD   5 mg  at 06/27/19 0840  . FLUoxetine (PROZAC) capsule 40 mg  40 mg Oral Daily Caroline Sauger, NP   40 mg at 06/27/19 0839  . glipiZIDE (GLUCOTROL XL) 24 hr tablet 10 mg  10 mg Oral Q breakfast Caroline Sauger, NP   10 mg at 06/27/19 0839  . insulin aspart (novoLOG) injection 0-5 Units  0-5 Units Subcutaneous QHS Caroline Sauger, NP   2 Units at 06/26/19 2107  . insulin aspart (novoLOG) injection 0-9 Units  0-9 Units Subcutaneous TID WC Caroline Sauger, NP   7 Units at 06/27/19 1116  . insulin aspart (novoLOG) injection 10 Units  10 Units Subcutaneous Once Caroline Sauger, NP   Stopped at 06/24/19 2200  . levothyroxine (SYNTHROID) tablet 88 mcg  88 mcg Oral QAC breakfast Caroline Sauger, NP   88 mcg at 06/27/19 [id number]  . linagliptin (TRADJENTA) tablet 5 mg  5 mg Oral Daily Clapacs, Madie Reno, MD   5 mg at 06/27/19 0840  . loratadine (CLARITIN) tablet 10 mg  10 mg Oral Daily Caroline Sauger, NP   10 mg at 06/27/19 0839  . lurasidone (LATUDA) tablet 20 mg  20 mg Oral Q supper Clapacs, Madie Reno, MD   20 mg at 06/26/19 1632  . magnesium hydroxide (MILK OF MAGNESIA) suspension 30 mL  30 mL Oral Daily PRN Caroline Sauger, NP      . rosuvastatin (CRESTOR) tablet 40 mg  40 mg Oral q1800 Caroline Sauger, NP   40 mg at 06/26/19 1828  . traZODone (DESYREL) tablet 100 mg  100 mg Oral QHS Caroline Sauger, NP   100 mg at 06/26/19 2103    Lab Results:  Results for orders placed or performed during the hospital encounter of 06/24/19 (from the past 48 hour(s))  Glucose, capillary     Status: Abnormal   Collection Time: 06/25/19 11:37 AM  Result Value Ref Range   Glucose-Capillary 277 (H) 70 - 99 mg/dL  Glucose, capillary     Status: Abnormal   Collection Time: 06/25/19  4:19 PM  Result Value Ref Range   Glucose-Capillary 225 (H) 70 - 99 mg/dL   Comment 1 Notify RN   Glucose, capillary     Status: Abnormal   Collection Time: 06/26/19  7:04 AM  Result Value Ref Range    Glucose-Capillary 194 (H) 70 - 99 mg/dL  Glucose, capillary     Status: Abnormal   Collection Time: 06/26/19 11:08 AM  Result Value Ref Range   Glucose-Capillary 294 (H) 70 - 99 mg/dL  Glucose, capillary     Status: Abnormal  Collection Time: 06/26/19  4:16 PM  Result Value Ref Range   Glucose-Capillary 211 (H) 70 - 99 mg/dL  Glucose, capillary     Status: Abnormal   Collection Time: 06/26/19  9:01 PM  Result Value Ref Range   Glucose-Capillary 246 (H) 70 - 99 mg/dL   Comment 1 Notify RN   Glucose, capillary     Status: Abnormal   Collection Time: 06/27/19  7:00 AM  Result Value Ref Range   Glucose-Capillary 261 (H) 70 - 99 mg/dL   Comment 1 Notify RN   Glucose, capillary     Status: Abnormal   Collection Time: 06/27/19 10:56 AM  Result Value Ref Range   Glucose-Capillary 306 (H) 70 - 99 mg/dL   Comment 1 Notify RN     Blood Alcohol level:  Lab Results  Component Value Date   ETH <10 06/22/2019   ETH <10 [id number]    Metabolic Disorder Labs: Lab Results  Component Value Date   HGBA1C 9.6 (H) 07/21/2018   MPG 228.82 07/21/2018   MPG 240 07/07/2018   No results found for: PROLACTIN Lab Results  Component Value Date   CHOL 167 07/21/2018   TRIG 221 (H) 07/21/2018   HDL 48 07/21/2018   CHOLHDL 3.5 07/21/2018   VLDL 44 (H) 07/21/2018   LDLCALC 75 07/21/2018   LDLCALC 93 07/07/2018    Physical Findings: AIMS:  , ,  ,  ,    CIWA:    COWS:     Musculoskeletal: Strength & Muscle Tone: within normal limits Gait & Station: normal Patient leans: N/A  Psychiatric Specialty Exam: Physical Exam  Review of Systems  Blood pressure 135/72, pulse 68, temperature 98.5 F (36.9 C), temperature source Oral, resp. rate 18, height 5\' 1"  (1.549 m), weight 78.5 kg, SpO2 92 %.Body mass index is 32.7 kg/m.  General Appearance: Casual  Eye Contact:  Good  Speech:  Clear and Coherent  Volume:  Normal  Mood:  Euthymic  Affect:  Appropriate  Thought Process:  Coherent and  Goal Directed  Orientation:  Full (Time, Place, and Person)  Thought Content:  Logical  Suicidal Thoughts:  No  Homicidal Thoughts:  No  Memory:  Immediate;   Good Recent;   Good  Judgement:  Good  Insight:  Good  Psychomotor Activity:  Normal  Concentration:  Concentration: Good and Attention Span: Good  Recall:  Good  Fund of Knowledge:  Good  Language:  Good  Akathisia:  Negative  Handed:  Right  AIMS (if indicated):     Assets:  Physical Health Resilience Social Support  ADL's:  Intact  Cognition:  WNL  Sleep:  Number of Hours: 8.25     Treatment Plan Summary: Daily contact with patient to assess and evaluate symptoms and progress in treatment and Medication management  Continue current cognitive therapy current meds without adjustments probable discharge tomorrow No change in precautions  Kharizma Lesnick, MD 06/27/2019, 11:24 AM

## 2019-06-28 LAB — GLUCOSE, CAPILLARY
Glucose-Capillary: 267 mg/dL — ABNORMAL HIGH (ref 70–99)
Glucose-Capillary: 278 mg/dL — ABNORMAL HIGH (ref 70–99)
Glucose-Capillary: 235 mg/dL — ABNORMAL HIGH (ref 70–99)
Glucose-Capillary: 228 mg/dL — ABNORMAL HIGH (ref 70–99)

## 2019-06-28 MED ORDER — IBUPROFEN 600 MG PO TABS
600.0000 mg | ORAL_TABLET | Freq: Four times a day (QID) | ORAL | Status: DC | PRN
  Administered 2019-06-28: 600 mg via ORAL
  Filled 2019-06-28: qty 1

## 2019-06-28 NOTE — [facility] Group Notes (Signed)
LCSW Group Therapy Note   06/28/2019 1:00 PM  Type of Therapy and Topic:  Group Therapy:  Overcoming Obstacles   Participation Level:  Minimal   Description of Group:    In this group patients will be encouraged to explore what they see as obstacles to their own wellness and recovery. They will be guided to discuss their thoughts, feelings, and behaviors related to these obstacles. The group will process together ways to cope with barriers, with attention given to specific choices patients can make. Each patient will be challenged to identify changes they are motivated to make in order to overcome their obstacles. This group will be process-oriented, with patients participating in exploration of their own experiences as well as giving and receiving support and challenge from other group members.   Therapeutic Goals: 1. Patient will identify personal and current obstacles as they relate to admission. 2. Patient will identify barriers that currently interfere with their wellness or overcoming obstacles.  3. Patient will identify feelings, thought process and behaviors related to these barriers. 4. Patient will identify two changes they are willing to make to overcome these obstacles:      Summary of Patient Progress Pt was present in group.  Patient shared how she has felt dtonger and better since being in the hospital.     Therapeutic Modalities:   Cognitive Behavioral Therapy Solution Focused Therapy Motivational Interviewing Relapse Prevention Therapy  Assunta Curtis, MSW, LCSW 06/28/2019 12:57 PM

## 2019-06-28 NOTE — Progress Notes (Signed)
[facility] MD Progress Note  06/28/2019 3:11 PM [name]  MRN:  [id number]   Subjective: Follow-up for this 74 year old female diagnosed with MDD severe recurrent.  Patient reports that she feels great today.  Patient reports that she is had significant improvement while she has been here.  She states that when she came to the hospital she felt extremely hopeless and then never felt this way before.  She states that she honestly felt that she would leave the hospital dead and that she would never feel better.  She states that she feels great now and she is glad that she came to the hospital and is very appreciative of the treatment she has received here.  Patient denies any suicidal or homicidal ideations and denies any hallucinations.  Patient reports that she is ready to get back home because she knows she has things that she needs to take care of.  She also reports that she would like to get back with [facility] and would like to speak with Lanae Boast when he is back here.  She states that she has been sleeping good and eating well and has no concerns or complaints at this time and her only question is is when that she get to go home.  Principal Problem: MDD (major depressive disorder), recurrent episode, severe ([place]) Diagnosis: Principal Problem:   MDD (major depressive disorder), recurrent episode, severe ([place]) Active Problems:   Essential hypertension   Hypothyroidism   Dyslipidemia   Diabetes ([place])  Total Time spent with patient: 20 minutes  Past Psychiatric History: Patient has a long history of mood instability with a diagnosis of bipolar depression.  Frequently presents with similar symptoms.  Has a history of some acting out suicidal behavior in the past  Past Medical History:  Past Medical History:  Diagnosis Date  . Arthritis   . Breast cancer ([place])   . Cancer [facility]) 2004   right breast ca  . Diabetes mellitus without complication ([place])   . Hypercholesterolemia   . Hypertension    . Hypothyroid   . Seasonal allergies     Past Surgical History:  Procedure Laterality Date  . ABDOMINAL HYSTERECTOMY    . BREAST BIOPSY Left    neg  . BREAST EXCISIONAL BIOPSY Right 2004   breast ca and lumpectomy  . BREAST LUMPECTOMY Right 2004   breast ca  . CHOLECYSTECTOMY    . COLONOSCOPY WITH PROPOFOL N/A 02/13/2017   Procedure: COLONOSCOPY WITH PROPOFOL;  Surgeon: Jonathon Bellows, MD;  Location: [facility] ENDOSCOPY;  Service: Gastroenterology;  Laterality: N/A;  . KIDNEY STONE SURGERY     Family History:  Family History  Problem Relation Age of Onset  . Bladder Cancer Neg Hx   . Kidney cancer Neg Hx    Family Psychiatric  History: None reported Social History:  Social History   Substance and Sexual Activity  Alcohol Use No     Social History   Substance and Sexual Activity  Drug Use No    Social History   Socioeconomic History  . Marital status: Divorced    Spouse name: Not on file  . Number of children: Not on file  . Years of education: Not on file  . Highest education level: Not on file  Occupational History  . Not on file  Tobacco Use  . Smoking status: Former Smoker    Types: Cigarettes  . Smokeless tobacco: Never Used  Substance and Sexual Activity  . Alcohol use: No  . Drug use:  No  . Sexual activity: Not Currently  Other Topics Concern  . Not on file  Social History Narrative  . Not on file   Social Determinants of Health   Financial Resource Strain:   . Difficulty of Paying Living Expenses: Not on file  Food Insecurity:   . Worried About Charity fundraiser in the Last Year: Not on file  . Ran Out of Food in the Last Year: Not on file  Transportation Needs:   . Lack of Transportation (Medical): Not on file  . Lack of Transportation (Non-Medical): Not on file  Physical Activity:   . Days of Exercise per Week: Not on file  . Minutes of Exercise per Session: Not on file  Stress:   . Feeling of Stress : Not on file  Social Connections:   .  Frequency of Communication with Friends and Family: Not on file  . Frequency of Social Gatherings with Friends and Family: Not on file  . Attends Religious Services: Not on file  . Active Member of Clubs or Organizations: Not on file  . Attends Archivist Meetings: Not on file  . Marital Status: Not on file   Additional Social History:                         Sleep: Good  Appetite:  Good  Current Medications: Current Facility-Administered Medications  Medication Dose Route Frequency Provider Last Rate Last Admin  . acetaminophen (TYLENOL) tablet 650 mg  650 mg Oral Q6H PRN Caroline Sauger, NP      . alum & mag hydroxide-simeth (MAALOX/MYLANTA) 200-200-20 MG/5ML suspension 30 mL  30 mL Oral Q4H PRN Caroline Sauger, NP      . clonazePAM Bobbye [place]) tablet 1 mg  1 mg Oral QHS Caroline Sauger, NP   1 mg at 06/27/19 2128  . enalapril (VASOTEC) tablet 5 mg  5 mg Oral BID Johnn Hai, MD   5 mg at 06/28/19 0753  . FLUoxetine (PROZAC) capsule 40 mg  40 mg Oral Daily Caroline Sauger, NP   40 mg at 06/28/19 0752  . glipiZIDE (GLUCOTROL XL) 24 hr tablet 10 mg  10 mg Oral Q breakfast Caroline Sauger, NP   10 mg at 06/28/19 0752  . insulin aspart (novoLOG) injection 0-5 Units  0-5 Units Subcutaneous QHS Caroline Sauger, NP   4 Units at 06/27/19 2138  . insulin aspart (novoLOG) injection 0-9 Units  0-9 Units Subcutaneous TID WC Caroline Sauger, NP   5 Units at 06/28/19 1129  . insulin aspart (novoLOG) injection 10 Units  10 Units Subcutaneous Once Caroline Sauger, NP   Stopped at 06/24/19 2200  . levothyroxine (SYNTHROID) tablet 88 mcg  88 mcg Oral QAC breakfast Caroline Sauger, NP   88 mcg at 06/28/19 [id number]  . linagliptin (TRADJENTA) tablet 5 mg  5 mg Oral Daily Clapacs, Madie Reno, MD   5 mg at 06/28/19 0752  . loratadine (CLARITIN) tablet 10 mg  10 mg Oral Daily Caroline Sauger, NP   10 mg at 06/28/19 0753  . lurasidone (LATUDA)  tablet 20 mg  20 mg Oral Q supper Clapacs, Madie Reno, MD   20 mg at 06/27/19 1721  . magnesium hydroxide (MILK OF MAGNESIA) suspension 30 mL  30 mL Oral Daily PRN Caroline Sauger, NP      . rosuvastatin (CRESTOR) tablet 40 mg  40 mg Oral q1800 Caroline Sauger, NP   40 mg at 06/27/19 1722  .  traZODone (DESYREL) tablet 100 mg  100 mg Oral QHS Caroline Sauger, NP   100 mg at 06/27/19 2128    Lab Results:  Results for orders placed or performed during the hospital encounter of 06/24/19 (from the past 48 hour(s))  Glucose, capillary     Status: Abnormal   Collection Time: 06/26/19  4:16 PM  Result Value Ref Range   Glucose-Capillary 211 (H) 70 - 99 mg/dL  Glucose, capillary     Status: Abnormal   Collection Time: 06/26/19  9:01 PM  Result Value Ref Range   Glucose-Capillary 246 (H) 70 - 99 mg/dL   Comment 1 Notify RN   Glucose, capillary     Status: Abnormal   Collection Time: 06/27/19  7:00 AM  Result Value Ref Range   Glucose-Capillary 261 (H) 70 - 99 mg/dL   Comment 1 Notify RN   Glucose, capillary     Status: Abnormal   Collection Time: 06/27/19 10:56 AM  Result Value Ref Range   Glucose-Capillary 306 (H) 70 - 99 mg/dL   Comment 1 Notify RN   Glucose, capillary     Status: Abnormal   Collection Time: 06/27/19  4:27 PM  Result Value Ref Range   Glucose-Capillary 232 (H) 70 - 99 mg/dL   Comment 1 Notify RN   Glucose, capillary     Status: Abnormal   Collection Time: 06/27/19  9:32 PM  Result Value Ref Range   Glucose-Capillary 321 (H) 70 - 99 mg/dL  Glucose, capillary     Status: Abnormal   Collection Time: 06/28/19  6:56 AM  Result Value Ref Range   Glucose-Capillary 267 (H) 70 - 99 mg/dL   Comment 1 Notify RN   Glucose, capillary     Status: Abnormal   Collection Time: 06/28/19 11:05 AM  Result Value Ref Range   Glucose-Capillary 278 (H) 70 - 99 mg/dL   Comment 1 Notify RN    Comment 2 Document in Chart     Blood Alcohol level:  Lab Results  Component  Value Date   ETH <10 06/22/2019   ETH <10 [id number]    Metabolic Disorder Labs: Lab Results  Component Value Date   HGBA1C 9.6 (H) 07/21/2018   MPG 228.82 07/21/2018   MPG 240 07/07/2018   No results found for: PROLACTIN Lab Results  Component Value Date   CHOL 167 07/21/2018   TRIG 221 (H) 07/21/2018   HDL 48 07/21/2018   CHOLHDL 3.5 07/21/2018   VLDL 44 (H) 07/21/2018   LDLCALC 75 07/21/2018   LDLCALC 93 07/07/2018    Physical Findings: AIMS:  , ,  ,  ,    CIWA:    COWS:     Musculoskeletal: Strength & Muscle Tone: within normal limits Gait & Station: normal Patient leans: N/A  Psychiatric Specialty Exam: Physical Exam  Nursing note and vitals reviewed. Constitutional: She is oriented to person, place, and time. She appears well-developed and well-nourished.  Cardiovascular: Normal rate.  Respiratory: Effort normal.  Musculoskeletal:        General: Normal range of motion.  Neurological: She is alert and oriented to person, place, and time.  Skin: Skin is warm.    Review of Systems  Constitutional: Negative.   HENT: Negative.   Eyes: Negative.   Respiratory: Negative.   Cardiovascular: Negative.   Gastrointestinal: Negative.   Genitourinary: Negative.   Musculoskeletal: Negative.   Skin: Negative.   Neurological: Negative.   Psychiatric/Behavioral: Negative.     Blood  pressure 131/76, pulse 82, temperature 98.5 F (36.9 C), temperature source Oral, resp. rate 18, height 5\' 1"  (1.549 m), weight 78.5 kg, SpO2 95 %.Body mass index is 32.7 kg/m.  General Appearance: Casual and Fairly Groomed  Eye Contact:  Good  Speech:  Clear and Coherent and Normal Rate  Volume:  Normal  Mood:  Euthymic  Affect:  Congruent  Thought Process:  Coherent and Descriptions of Associations: Intact  Orientation:  Full (Time, Place, and Person)  Thought Content:  WDL  Suicidal Thoughts:  No  Homicidal Thoughts:  No  Memory:  Immediate;   Good Recent;   Good Remote;    Good  Judgement:  Fair  Insight:  Fair  Psychomotor Activity:  Normal  Concentration:  Concentration: Good  Recall:  Good  Fund of Knowledge:  Good  Language:  Good  Akathisia:  No  Handed:  Right  AIMS (if indicated):     Assets:  Communication Skills Desire for Improvement Financial Resources/Insurance Housing Resilience Transportation  ADL's:  Intact  Cognition:  WNL  Sleep:  Number of Hours: 8.25   Assessment: Patient presents in the day room is interacting with peers and staff appropriately.  Patient has been attending groups and being active during group.  Patient is pleasant, calm, and cooperative and she is continued to deny having any suicidal or homicidal ideations and denying any hallucinations.  Patient does seem to show some significant improvement and is wanting to have follow-up so that she has support when she is at home.  I have consulted with Dr. Weber Cooks about the possibility of a discharge and the patient is plan for discharge tomorrow so that she can return home.  It is to note patient's blood sugars have still remain slightly elevated and over the last 24 hours has ranged from 321-232.  Patient's last blood pressure reading was stable at 131/76.  Treatment Plan Summary: Daily contact with patient to assess and evaluate symptoms and progress in treatment and Medication management Continue Klonopin 1 mg p.o. nightly Continue Vasotec 5 mg p.o. twice daily for hypertension Continue Prozac 40 mg p.o. daily for MDD Continue Glucotrol XL 10 mg p.o. daily with breakfast for diabetes Continue NovoLog sliding scale Continue Synthroid 88 mcg p.o. daily before breakfast for hypothyroidism Continue Tradjenta 5 mg p.o. daily for diabetes Continue Latuda 20 mg p.o. daily with supper for MDD Continue Crestor 40 mg p.o. daily for hyperlipidemia Continue trazodone 100 mg p.o. nightly for insomnia Encourage group therapy participation Continue every 15 minute safety checks Plan  discharge for tomorrow  06/29/2019  Lewis Shock, FNP 06/28/2019, 3:11 PM

## 2019-06-28 NOTE — Progress Notes (Signed)
Inpatient Diabetes Program Recommendations  AACE/ADA: New Consensus Statement on Inpatient Glycemic Control (2015)  Target Ranges:  Prepandial:   less than 140 mg/dL      Peak postprandial:   less than 180 mg/dL (1-2 hours)      Critically ill patients:  140 - 180 mg/dL   Results for [name], [name] (MRN [id number]) as of 06/28/2019 14:03  Ref. Range 06/27/2019 07:00 06/27/2019 10:56 06/27/2019 16:27 06/27/2019 21:32  Glucose-Capillary Latest Ref Range: 70 - 99 mg/dL 261 (H)  5 units NOVOLOG  306 (H)  7 units NOVOLOG  232 (H)  3 units NOVOLOG  321 (H)  4 units NOVOLOG    Results for [name], [name] (MRN [id number]) as of 06/28/2019 14:03  Ref. Range 06/28/2019 06:56 06/28/2019 11:05  Glucose-Capillary Latest Ref Range: 70 - 99 mg/dL 267 (H)  5 units NOVOLOG  278 (H)  5 units NOVOLOG     Admit with: MDD (major depressive disorder)  History: DM  Home DM Meds: Glipizide 10 mg Daily       Januvia 100 mg Daily  Current Orders: Novolog Sensitive Correction Scale/ SSI (0-9 units) TID AC + HS      Glipizide 10 mg Daily      Tradjenta 5 mg Daily     MD- If patient not discharged home today, please consider the following in-hospital insulin adjustments:  1. Start Levemir 10 units QHS (~0.15 units/kg)  2. Start Novolog Meal Coverage: Novolog 6 units TID with meals  (Please add the following Hold Parameters: Hold if pt eats <50% of meal, Hold if pt NPO)     --Will follow patient during hospitalization--  Wyn Quaker RN, MSN, CDE Diabetes Coordinator Inpatient Glycemic Control Team Team Pager: [phone] (8a-5p)

## 2019-06-28 NOTE — Plan of Care (Signed)
  Problem: Education: Goal: Knowledge of [facility] [facility] Education information/materials will improve Outcome: Progressing Goal: Emotional status will improve Outcome: Progressing Goal: Mental status will improve Outcome: Progressing Goal: Verbalization of understanding the information provided will improve Outcome: Progressing  D: Patient has been pleasant and cooperative. Denies SI, HI and AVH A: Continues to monitor for safety R: Safety maintained.

## 2019-06-28 NOTE — Progress Notes (Signed)
D: Patient has been pleasant and cooperative. Denies SI, HI and AVH A: Continues to monitor for safety R: Safety maintained.

## 2019-06-28 NOTE — Plan of Care (Signed)
Patient is pleasant and cooperative on approach.Patient stated that she feels better and she thinks she is on the right medications now.Appropriate with staff & peers.Compliant with medications.Attended groups.Appetite and energy level good.Support and encouragement given.

## 2019-06-29 LAB — GLUCOSE, CAPILLARY
Glucose-Capillary: 217 mg/dL — ABNORMAL HIGH (ref 70–99)
Glucose-Capillary: 299 mg/dL — ABNORMAL HIGH (ref 70–99)

## 2019-06-29 MED ORDER — TRAZODONE HCL 100 MG PO TABS: 100.0000 mg | ORAL_TABLET | Freq: Every day | ORAL | 0 refills | Status: DC

## 2019-06-29 MED ORDER — LURASIDONE HCL 20 MG PO TABS: 20.0000 mg | ORAL_TABLET | Freq: Every day | ORAL | 0 refills | Status: DC

## 2019-06-29 MED ORDER — LINAGLIPTIN 5 MG PO TABS: 5.0000 mg | ORAL_TABLET | Freq: Every day | ORAL | 0 refills | Status: DC

## 2019-06-29 MED ORDER — ENALAPRIL MALEATE 5 MG PO TABS: 5.0000 mg | ORAL_TABLET | Freq: Two times a day (BID) | ORAL | 0 refills | Status: DC

## 2019-06-29 NOTE — Progress Notes (Signed)
D: patient has been pleasant and cooperative. Denies SI, HI and AVH A: Continue to monitor for safety R: Safety maintained.

## 2019-06-29 NOTE — Plan of Care (Signed)
  Problem: Education: Goal: Knowledge of [facility] Education information/materials will improve Outcome: Adequate for Discharge Goal: Emotional status will improve Outcome: Adequate for Discharge Goal: Mental status will improve Outcome: Adequate for Discharge Goal: Verbalization of understanding the information provided will improve Outcome: Adequate for Discharge   Problem: Safety: Goal: Periods of time without injury will increase Outcome: Adequate for Discharge   Problem: Education: Goal: Utilization of techniques to improve thought processes will improve Outcome: Adequate for Discharge Goal: Knowledge of the prescribed therapeutic regimen will improve Outcome: Adequate for Discharge   Problem: Safety: Goal: Ability to disclose and discuss suicidal ideas will improve Outcome: Adequate for Discharge

## 2019-06-29 NOTE — [facility] Group Notes (Signed)
Overcoming Obstacles  06/29/2019 1PM  Type of Therapy and Topic:  Group Therapy:  Overcoming Obstacles  Participation Level:  Active    Description of Group:    In this group patients will be encouraged to explore what they see as obstacles to their own wellness and recovery. They will be guided to discuss their thoughts, feelings, and behaviors related to these obstacles. The group will process together ways to cope with barriers, with attention given to specific choices patients can make. Each patient will be challenged to identify changes they are motivated to make in order to overcome their obstacles. This group will be process-oriented, with patients participating in exploration of their own experiences as well as giving and receiving support and challenge from other group members.   Therapeutic Goals: 1. Patient will identify personal and current obstacles as they relate to admission. 2. Patient will identify barriers that currently interfere with their wellness or overcoming obstacles.  3. Patient will identify feelings, thought process and behaviors related to these barriers. 4. Patient will identify two changes they are willing to make to overcome these obstacles:      Summary of Patient Progress Pt discussed with group members wanting to have more socialization with others and volunteer opportunities. Pt reports she has isolated herself for many years and recognizes the importance of having a support system. Pt was receptive to feedback provided by group members on community activities she can get involved in such as silver sneakers at the [employer]. Pt states she will look into this program when she returns home.    Therapeutic Modalities:   Cognitive Behavioral Therapy Solution Focused Therapy Motivational Interviewing Relapse Prevention Therapy    Sanjuana Kava, MSW, LCSW 06/29/2019 2:19 PM

## 2019-06-29 NOTE — Progress Notes (Signed)
D: Patient is aware of  Discharge this shift .  A:Patient denies suicidal /homicidal ideations. Patient received all belongings brought in  No Storage medications. Writer reviewed Discharge Summary, Suicide Risk Assessment, and Transitional Record. Patient also received Prescriptions   from  MD.  That were called into her pharmancy Aware  Of follow up appointment .  R: Patient left unit with no questions  Or concerns  With [place] [id number] Driver Edward

## 2019-06-29 NOTE — Progress Notes (Signed)
   06/29/19 1300  Clinical Encounter Type  Visited With Patient  Visit Type Follow-up;Spiritual support;Social support;Behavioral Health  Referral From Chaplain  Consult/Referral To Chaplain  Chaplain encountered patient in the dayroom. Patient said she is feeling much better and knows what she can do. Patient wants to help others once discharged. Patient mentioned giving out toys and helping seniors. Patient said she use to be in denial about needing help, but she is aware that she needed help and medicine. Patient appears to be properly focused and is happy for her stay here. Chaplain offered pastoral presence and empathy.

## 2019-06-29 NOTE — Plan of Care (Signed)
  Problem: Safety: Goal: Periods of time without injury will increase Outcome: Progressing  D: patient has been pleasant and cooperative. Denies SI, HI and AVH A: Continue to monitor for safety R: Safety maintained.

## 2019-06-29 NOTE — Progress Notes (Signed)
  [facility] Adult Case Management Discharge Plan :  Will you be returning to the same living situation after discharge:  Yes,  pt reports that she is returning home.  At discharge, do you have transportation home?: Yes,  CSW will assist with transportation. Do you have the ability to pay for your medications: Yes,  [employer] Medicare  Release of information consent forms completed and in the chart;  Patient's signature needed at discharge.  Patient to Follow up at: Follow-up Information    [place] Follow up on 07/02/2019.   Why: Please attend your appointment on 07/02/2019 at 12:30PM.  Let them know that you are there for a hospital discharge appointment. Contact information: [place] [id number] [phone]           Next level of care provider has access to [place] and Suicide Prevention discussed: No.  Pt declined SPE contact at this time.     Has patient been referred to the Quitline?: Patient refused referral  Patient has been referred for addiction treatment: Pt. refused referral  Rozann Lesches, LCSW 06/29/2019, 9:29 AM

## 2019-06-29 NOTE — Discharge Summary (Addendum)
Physician Discharge Summary Note  Patient:  [name] is an 74 y.o., female MRN:  [id number] DOB:  15-Nov-1945 Patient phone:  [phone] (home)  Patient address:   [address] [place] [address],  Total Time spent with patient: 45 minutes  Date of Admission:  06/24/2019 Date of Discharge: 06/29/2019  Reason for Admission: Patient was admitted with chronic mood problems, who presented to the hospital due to worsening depression  And passive suicidal ideations x 1 month.   Principal Problem: MDD (major depressive disorder), recurrent episode, severe ([place]) Discharge Diagnoses: Principal Problem:   MDD (major depressive disorder), recurrent episode, severe ([place]) Active Problems:   Essential hypertension   Hypothyroidism   Dyslipidemia   Diabetes ([place])   Past Psychiatric History: Patient has a long history of mood instability with a diagnosis of bipolar depression.  Frequently presents with similar symptoms.  Has a history of some acting out suicidal behavior in the past.Multiple hospitalizations last admission 07/2018 for the like.   Past Medical History:  Past Medical History:  Diagnosis Date  . Arthritis   . Breast cancer ([place])   . Cancer [facility]) 2004   right breast ca  . Diabetes mellitus without complication ([place])   . Hypercholesterolemia   . Hypertension   . Hypothyroid   . Seasonal allergies     Past Surgical History:  Procedure Laterality Date  . ABDOMINAL HYSTERECTOMY    . BREAST BIOPSY Left    neg  . BREAST EXCISIONAL BIOPSY Right 2004   breast ca and lumpectomy  . BREAST LUMPECTOMY Right 2004   breast ca  . CHOLECYSTECTOMY    . COLONOSCOPY WITH PROPOFOL N/A 02/13/2017   Procedure: COLONOSCOPY WITH PROPOFOL;  Surgeon: Jonathon Bellows, MD;  Location: [facility] ENDOSCOPY;  Service: Gastroenterology;  Laterality: N/A;  . KIDNEY STONE SURGERY     Family History:  Family History  Problem Relation Age of Onset  . Bladder Cancer Neg Hx   .  Kidney cancer Neg Hx    Family Psychiatric  History: None Social History:  Social History   Substance and Sexual Activity  Alcohol Use No     Social History   Substance and Sexual Activity  Drug Use No    Social History   Socioeconomic History  . Marital status: Divorced    Spouse name: Not on file  . Number of children: Not on file  . Years of education: Not on file  . Highest education level: Not on file  Occupational History  . Not on file  Tobacco Use  . Smoking status: Former Smoker    Types: Cigarettes  . Smokeless tobacco: Never Used  Substance and Sexual Activity  . Alcohol use: No  . Drug use: No  . Sexual activity: Not Currently  Other Topics Concern  . Not on file  Social History Narrative  . Not on file   Social Determinants of Health   Financial Resource Strain:   . Difficulty of Paying Living Expenses: Not on file  Food Insecurity:   . Worried About Charity fundraiser in the Last Year: Not on file  . Ran Out of Food in the Last Year: Not on file  Transportation Needs:   . Lack of Transportation (Medical): Not on file  . Lack of Transportation (Non-Medical): Not on file  Physical Activity:   . Days of Exercise per Week: Not on file  . Minutes of Exercise per Session: Not on file  Stress:   .  Feeling of Stress : Not on file  Social Connections:   . Frequency of Communication with Friends and Family: Not on file  . Frequency of Social Gatherings with Friends and Family: Not on file  . Attends Religious Services: Not on file  . Active Member of Clubs or Organizations: Not on file  . Attends Archivist Meetings: Not on file  . Marital Status: Not on file    Hospital Course: [name] was admitted for MDD (major depressive disorder), recurrent episode, severe ([place]) and crisis management.  She was treated with the following medications Prozac 40mg  po daily for severe depression, Latuda 58mmg po daily for mood stabilization.   [name] was discharged with current medication and was instructed on how to take medications as prescribed; (details listed below under Medication List).  Medical problems were identified and treated as needed which include Trazodone, Klonpin, Synthroid. Marland Kitchen  Home medications were restarted as appropriate.  Improvement was monitored by observation and [name] daily report of symptom reduction.  Emotional and mental status was monitored by daily self-inventory reports completed by [name] and clinical staff.         [name] was evaluated by the treatment team for stability and plans for continued recovery upon discharge.  [name] motivation was an integral factor for scheduling further treatment.  Employment, transportation, bed availability, health status, family support, and any pending legal issues were also considered during her hospital stay.  She was offered further treatment options upon discharge including but not limited to Residential, Intensive Outpatient, and Outpatient treatment.  [name] will follow up with the services as listed below under Follow Up Information.     Upon completion of this admission the [facility] was both mentally and medically stable for discharge denying suicidal/homicidal ideation, auditory/visual/tactile hallucinations, delusional thoughts and paranoia.      Physical Findings: AIMS:  , ,  ,  ,    CIWA:    COWS:     Musculoskeletal: Strength & Muscle Tone: within normal limits Gait & Station: normal Patient leans: N/A  Psychiatric Specialty Exam: Physical Exam  Nursing note and vitals reviewed. Constitutional: She is oriented to person, place, and time. She appears well-developed and well-nourished.  HENT:  Head: Normocephalic and atraumatic.  Eyes: Pupils are equal, round, and reactive to light. Conjunctivae are normal.  Cardiovascular: Regular rhythm and  normal heart sounds.  Respiratory: Effort normal. No respiratory distress. She has no wheezes.  GI: Soft.  Musculoskeletal:        General: Normal range of motion.     Cervical back: Normal range of motion.  Neurological: She is alert and oriented to person, place, and time.  Skin: Skin is warm and dry.  Psychiatric: She has a normal mood and affect. Her speech is normal and behavior is normal. Judgment and thought content normal. Her affect is not blunt. Cognition and memory are normal.    Review of Systems  Constitutional: Negative.   HENT: Negative.   Eyes: Negative.   Respiratory: Negative.   Cardiovascular: Negative.   Gastrointestinal: Negative.   Musculoskeletal: Negative.   Skin: Negative.   Neurological: Negative.   Psychiatric/Behavioral: Negative.   All other systems reviewed and are negative.   Blood pressure 132/68, pulse 74, temperature 97.9 F (36.6 C), temperature source Oral, resp. rate 17, height 5\' 1"  (1.549 m), weight 78.5 kg, SpO2 93 %.Body mass index is 32.7 kg/m.  General Appearance: Casual  Eye Contact:  Good  Speech:  Clear and Coherent  Volume:  Normal  Mood:  Euthymic  Affect:  Congruent  Thought Process:  Goal Directed  Orientation:  Full (Time, Place, and Person)  Thought Content:  Logical  Suicidal Thoughts:  No  Homicidal Thoughts:  No  Memory:  Immediate;   Fair Recent;   Fair Remote;   Fair  Judgement:  Fair  Insight:  Fair  Psychomotor Activity:  Normal  Concentration:  Concentration: Fair  Recall:  [place] of Knowledge:  Fair  Language:  Fair  Akathisia:  No  Handed:  Right  AIMS (if indicated):     Assets:  Desire for Improvement Housing Resilience  ADL's:  Intact  Cognition:  WNL  Sleep:  Number of Hours: 6.75        Has this patient used any form of tobacco in the last 30 days? (Cigarettes, Smokeless Tobacco, Cigars, and/or Pipes) Yes, No  Blood Alcohol level:  Lab Results  Component Value Date   ETH <10  06/22/2019   ETH <10 [id number]    Metabolic Disorder Labs:  Lab Results  Component Value Date   HGBA1C 9.6 (H) 07/21/2018   MPG 228.82 07/21/2018   MPG 240 07/07/2018   No results found for: PROLACTIN Lab Results  Component Value Date   CHOL 167 07/21/2018   TRIG 221 (H) 07/21/2018   HDL 48 07/21/2018   CHOLHDL 3.5 07/21/2018   VLDL 44 (H) 07/21/2018   [place] 75 07/21/2018   [place] 93 07/07/2018    See Psychiatric Specialty Exam and Suicide Risk Assessment completed by Attending Physician prior to discharge.  Discharge destination:  Home  Is patient on multiple antipsychotic therapies at discharge:  No   Has Patient had three or more failed trials of antipsychotic monotherapy by history:  No  Recommended Plan for Multiple Antipsychotic Therapies: NA  Discharge Instructions    Discharge instructions   Complete by: As directed    Please continue to take medications as directed. If your symptoms return, worsen, or persist please call your 911, report to local ER, or contact crisis hotline. Please do not drink alcohol or use any illegal substances while taking prescription medications. Please pay close attention to your medication list as several changes have been made. Please take this list with you when you go your outpatient appointment.     Allergies as of 06/29/2019      Reactions   Navane [thiothixene] Other (See Comments)   Reaction:  Unknown   Penicillins Rash   Has patient had a PCN reaction causing immediate rash, facial/tongue/throat swelling, SOB or lightheadedness with hypotension: No Has patient had a PCN reaction causing severe rash involving mucus membranes or skin necrosis: No Has patient had a PCN reaction that required hospitalization: No Has patient had a PCN reaction occurring within the last 10 years: No If all of the above answers are "NO", then may proceed with Cephalosporin use.      Medication List    STOP taking these medications   Januvia  100 MG tablet Generic drug: sitaGLIPtin   metoprolol succinate 50 MG 24 hr tablet Commonly known as: TOPROL-XL   simvastatin 20 MG tablet Commonly known as: ZOCOR     TAKE these medications     Indication  clonazePAM 1 MG tablet Commonly known as: KLONOPIN Take 1 tablet (1 mg total) by mouth at bedtime.  Indication: Manic-Depression   divalproex 500 MG 24  hr tablet Commonly known as: DEPAKOTE ER Take 2 tablets (1,000 mg total) by mouth at bedtime.  Indication: MIXED BIPOLAR AFFECTIVE DISORDER   enalapril 5 MG tablet Commonly known as: VASOTEC Take 1 tablet (5 mg total) by mouth 2 (two) times daily. What changed:   medication strength  how much to take  Indication: High Blood Pressure Disorder   FLUoxetine 40 MG capsule Commonly known as: PROZAC Take 1 capsule (40 mg total) by mouth daily.  Indication: Depression   glipiZIDE 5 MG 24 hr tablet Commonly known as: GLUCOTROL XL Take 2 tablets by mouth 2 (two) times daily.  Indication: Type 2 Diabetes   levothyroxine 88 MCG tablet Commonly known as: SYNTHROID Take 88 mcg by mouth daily before breakfast.  Indication: Underactive Thyroid   linagliptin 5 MG Tabs tablet Commonly known as: TRADJENTA Take 1 tablet (5 mg total) by mouth daily. Start taking on: June 30, 2019  Indication: Type 2 Diabetes   loratadine 10 MG tablet Commonly known as: CLARITIN Take 1 tablet (10 mg total) by mouth daily.  Indication: Perennial Allergic Rhinitis   lurasidone 20 MG Tabs tablet Commonly known as: LATUDA Take 1 tablet (20 mg total) by mouth daily with supper.  Indication: Depressive Phase of Manic-Depression   [place] 0.7 % Soln Generic drug: Olopatadine HCl Take 1 drop by mouth daily.  Indication: Allergic Conjunctivitis   rosuvastatin 40 MG tablet Commonly known as: CRESTOR Take 40 mg by mouth daily.  Indication: Disease involving Lipid Deposits in the Arteries   traZODone 100 MG tablet Commonly known as:  DESYREL Take 1 tablet (100 mg total) by mouth at bedtime.  Indication: [place] Follow up on 07/02/2019.   Why: Please attend your appointment on 07/02/2019 at 12:30PM.  Let them know that you are there for a hospital discharge appointment. Contact information: [place] [address] [phone]           Follow-up recommendations:  Activity:  Complete activity as tolerated Diet:  Routine diet as directed by outpatient PCP.  Tests:  Routine testing every 3 months to include a1c, lipid, tsh, and weights if needed.  Other:  Keep taking your medications even if you begin to feel better. Make note that some of your medications and regimen has changed.   Comments: Keep your outpatient appointment.   Signed: Suella Broad, FNP 06/29/2019, 10:25 AM

## 2019-06-29 NOTE — Progress Notes (Signed)
   06/28/19 1000  Clinical Encounter Type  Visited With Patient  Visit Type Initial  Referral From Chaplain  Consult/Referral To Chaplain  While doing rounds Chaplain spoke with patient in the dayroom. Patient said that she is feeling much better and the stay here has been helpful. Chaplain offered pastoral presence and empathy.

## 2019-06-29 NOTE — [facility] Suicide Risk Assessment (Signed)
[facility] Discharge Suicide Risk Assessment   Principal Problem: MDD (major depressive disorder), recurrent episode, severe ([place]) Discharge Diagnoses: Principal Problem:   MDD (major depressive disorder), recurrent episode, severe ([place]) Active Problems:   Essential hypertension   Hypothyroidism   Dyslipidemia   Diabetes ([place])   Total Time spent with patient: 30 minutes  Musculoskeletal: Strength & Muscle Tone: within normal limits Gait & Station: normal Patient leans: N/A  Psychiatric Specialty Exam: Review of Systems  Constitutional: Negative.   HENT: Negative.   Eyes: Negative.   Respiratory: Negative.   Cardiovascular: Negative.   Gastrointestinal: Negative.   Musculoskeletal: Negative.   Skin: Negative.   Neurological: Negative.   Psychiatric/Behavioral: Negative.     Blood pressure 132/68, pulse 74, temperature 97.9 F (36.6 C), temperature source Oral, resp. rate 17, height 5\' 1"  (1.549 m), weight 78.5 kg, SpO2 93 %.Body mass index is 32.7 kg/m.  General Appearance: Casual  Eye Contact::  Good  Speech:  Clear and [id number]  Volume:  Normal  Mood:  Euthymic  Affect:  Appropriate  Thought Process:  Goal Directed  Orientation:  Full (Time, Place, and Person)  Thought Content:  Logical  Suicidal Thoughts:  No  Homicidal Thoughts:  No  Memory:  Immediate;   Fair Recent;   Fair Remote;   Fair  Judgement:  Fair  Insight:  Fair  Psychomotor Activity:  Normal  Concentration:  Fair  Recall:  [employer] of Knowledge:Fair  Language: Fair  Akathisia:  No  Handed:  Right  AIMS (if indicated):     Assets:  Desire for Improvement Housing Resilience Social Support  Sleep:  Number of Hours: 6.75  Cognition: WNL  ADL's:  Intact   Mental Status Per Nursing Assessment::   On Admission:  NA([place] SI, contracts for safety verbally)  Demographic Factors:  Age 31 or older, Caucasian and Living alone  Loss Factors: Loss of significant relationship  Historical  Factors: Impulsivity  Risk Reduction Factors:   Positive therapeutic relationship  Continued Clinical Symptoms:  Bipolar Disorder:   Depressive phase  Cognitive Features That Contribute To Risk:  None    Suicide Risk:  Minimal: No identifiable suicidal ideation.  Patients presenting with no risk factors but with morbid ruminations; may be classified as minimal risk based on the severity of the depressive symptoms  Follow-up Information    [place] Follow up on 07/02/2019.   Why: Please attend your appointment on 07/02/2019 at 12:30PM.  Let them know that you are there for a hospital discharge appointment. Contact information: [place] [address] [phone]           Plan Of Care/Follow-up recommendations:  Activity:  Activity as tolerated Diet:  Regular diet Other:  Follow-up with outpatient care through [place], MD 06/29/2019, 10:31 AM

## 2019-07-01 ENCOUNTER — Emergency Department
Admission: EM | Admit: 2019-07-01 | Discharge: 2019-07-01 | Discharge status: Against Medical Advice | Payer: Medicare HMO | Attending: Emergency Medicine | Admitting: Emergency Medicine

## 2019-07-01 ENCOUNTER — Other Ambulatory Visit: Payer: Self-pay

## 2019-07-01 ENCOUNTER — Encounter: Payer: Self-pay | Admitting: Emergency Medicine

## 2019-07-01 DIAGNOSIS — W010XXA Fall on same level from slipping, tripping and stumbling without subsequent striking against object, initial encounter: Secondary | ICD-10-CM | POA: Diagnosis not present

## 2019-07-01 DIAGNOSIS — Y999 Unspecified external cause status: Secondary | ICD-10-CM | POA: Diagnosis not present

## 2019-07-01 DIAGNOSIS — Z043 Encounter for examination and observation following other accident: Secondary | ICD-10-CM | POA: Insufficient documentation

## 2019-07-01 DIAGNOSIS — E039 Hypothyroidism, unspecified: Secondary | ICD-10-CM | POA: Diagnosis not present

## 2019-07-01 DIAGNOSIS — Y939 Activity, unspecified: Secondary | ICD-10-CM | POA: Insufficient documentation

## 2019-07-01 DIAGNOSIS — E1165 Type 2 diabetes mellitus with hyperglycemia: Secondary | ICD-10-CM

## 2019-07-01 DIAGNOSIS — Y929 Unspecified place or not applicable: Secondary | ICD-10-CM | POA: Insufficient documentation

## 2019-07-01 DIAGNOSIS — Z79899 Other long term (current) drug therapy: Secondary | ICD-10-CM | POA: Diagnosis not present

## 2019-07-01 DIAGNOSIS — Z7984 Long term (current) use of oral hypoglycemic drugs: Secondary | ICD-10-CM | POA: Diagnosis not present

## 2019-07-01 DIAGNOSIS — R350 Frequency of micturition: Secondary | ICD-10-CM | POA: Diagnosis not present

## 2019-07-01 DIAGNOSIS — W19XXXA Unspecified fall, initial encounter: Secondary | ICD-10-CM

## 2019-07-01 DIAGNOSIS — Z87891 Personal history of nicotine dependence: Secondary | ICD-10-CM | POA: Diagnosis not present

## 2019-07-01 DIAGNOSIS — I10 Essential (primary) hypertension: Secondary | ICD-10-CM | POA: Diagnosis not present

## 2019-07-01 DIAGNOSIS — Y92009 Unspecified place in unspecified non-institutional (private) residence as the place of occurrence of the external cause: Secondary | ICD-10-CM

## 2019-07-01 LAB — GLUCOSE, CAPILLARY: Glucose-Capillary: 351 mg/dL — ABNORMAL HIGH (ref 70–99)

## 2019-07-01 NOTE — ED Notes (Signed)
Pt presents following a fall at home where pt states she " lost her balance" pt denies injury or pain. Able to move all extremities.

## 2019-07-01 NOTE — ED Notes (Signed)
Pt left AMA, patient stated she had too much stuff to do at home. Pt also stated she was waiting for a delivery of medications to her home and had an appointment at [facility] tomorrow. Pt provided w/ phone to call taxi and wheeled out to lobby by EMS student to wait for taxi.

## 2019-07-01 NOTE — ED Triage Notes (Signed)
Pt arrived via [facility] following a fall at home. Pt denies hitting head or pain. Pt states she supported herself and "didn't anything". Pt states she lost her balance. EMS reported pt's Hr was144 on arrival and CBG was 353.

## 2019-07-01 NOTE — ED Provider Notes (Signed)
[facility] Emergency Department Provider Note  ____________________________________________  Time seen: Approximately 4:55 PM  I have reviewed the triage vital signs and the nursing notes.   HISTORY  Chief Complaint Fall    HPI Valerye Carli Martinson is a 74 y.o. female with a history of breast cancer, diabetes, hypertension who comes the ED complaining of a fall at home.  She denies any pain or acute complaints, states she did not lose consciousness but just tripped and fell.  She does note that she has been urinating more frequently.  She has diabetes which is controlled with oral medications.  She notes that she has a follow-up appointment at [facility] tomorrow and an appointment with her primary care doctor in 5 days.  She states that she needs to go home right away because she has a lot to do before her appointment tomorrow and she does not have cell service here in the ED, and she is expecting a medication delivery from her local pharmacy in about an hour.      Past Medical History:  Diagnosis Date  . Arthritis   . Breast cancer ([place])   . Cancer [facility]) 2004   right breast ca  . Diabetes mellitus without complication ([place])   . Hypercholesterolemia   . Hypertension   . Hypothyroid   . Seasonal allergies      Patient Active Problem List   Diagnosis Date Noted  . MDD (major depressive disorder), recurrent episode, severe ([place]) 06/24/2019  . Pain due to onychomycosis of toenails of both feet 11/23/2018  . Bipolar 1 disorder, manic, moderate ([place]) 07/21/2018  . Bipolar 1 disorder, mixed, severe ([place]) 07/21/2018  . Bipolar 1 disorder with moderate mania ([place]) 07/20/2018  . Bipolar I disorder, most recent episode depressed with anxious distress ([place]) 07/07/2017  . PTSD (post-traumatic stress disorder) 07/07/2017  . 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 Diabetes ([place]) 09/23/2014  . Essential hypertension  09/21/2014  . Hypothyroidism 09/21/2014  . Dyslipidemia 09/21/2014  . Bipolar I disorder, current or most recent episode manic, severe with mixed features ([place]) 09/20/2014     Past Surgical History:  Procedure Laterality Date  . ABDOMINAL HYSTERECTOMY    . BREAST BIOPSY Left    neg  . BREAST EXCISIONAL BIOPSY Right 2004   breast ca and lumpectomy  . BREAST LUMPECTOMY Right 2004   breast ca  . CHOLECYSTECTOMY    . COLONOSCOPY WITH PROPOFOL N/A 02/13/2017   Procedure: COLONOSCOPY WITH PROPOFOL;  Surgeon: Jonathon Bellows, MD;  Location: [facility] ENDOSCOPY;  Service: Gastroenterology;  Laterality: N/A;  . KIDNEY STONE SURGERY       Prior to Admission medications   Medication Sig Start Date End Date Taking? Authorizing Provider  clonazePAM (KLONOPIN) 1 MG tablet Take 1 tablet (1 mg total) by mouth at bedtime. 07/27/18   Clapacs, Madie Reno, MD  divalproex (DEPAKOTE ER) 500 MG 24 hr tablet Take 2 tablets (1,000 mg total) by mouth at bedtime. 07/27/18   Clapacs, Madie Reno, MD  enalapril (VASOTEC) 5 MG tablet Take 1 tablet (5 mg total) by mouth 2 (two) times daily. 06/29/19   Starkes-Perry, Gayland Curry, FNP  FLUoxetine (PROZAC) 40 MG capsule Take 1 capsule (40 mg total) by mouth daily. 07/28/18   Clapacs, Madie Reno, MD  glipiZIDE (GLUCOTROL XL) 5 MG 24 hr tablet Take 2 tablets by mouth 2 (two) times daily. 06/21/19   [provider]  levothyroxine (SYNTHROID) 88 MCG tablet Take  88 mcg by mouth daily before breakfast.  11/09/18   [provider]  linagliptin (TRADJENTA) 5 MG TABS tablet Take 1 tablet (5 mg total) by mouth daily. 06/30/19   Starkes-Perry, Gayland Curry, FNP  loratadine (CLARITIN) 10 MG tablet Take 1 tablet (10 mg total) by mouth daily. 07/27/18   Clapacs, Madie Reno, MD  lurasidone (LATUDA) 20 MG TABS tablet Take 1 tablet (20 mg total) by mouth daily with supper. 06/29/19   Starkes-Perry, Gayland Curry, FNP  PAZEO 0.7 % SOLN Take 1 drop by mouth daily.  08/25/18   [provider]  rosuvastatin (CRESTOR)  40 MG tablet Take 40 mg by mouth daily. 05/12/19   [provider]  traZODone (DESYREL) 100 MG tablet Take 1 tablet (100 mg total) by mouth at bedtime. 06/29/19   Suella Broad, FNP     Allergies Navane [thiothixene] and Penicillins   Family History  Problem Relation Age of Onset  . Bladder Cancer Neg Hx   . Kidney cancer Neg Hx     Social History Social History   Tobacco Use  . Smoking status: Former Smoker    Types: Cigarettes  . Smokeless tobacco: Never Used  Substance Use Topics  . Alcohol use: No  . Drug use: No    Review of Systems  Constitutional:   No fever or chills.  ENT:   No sore throat. No rhinorrhea. Cardiovascular:   No chest pain or syncope. Respiratory:   No dyspnea or cough. Gastrointestinal:   Negative for abdominal pain, vomiting and diarrhea.  Musculoskeletal:   Negative for focal pain or swelling All other systems reviewed and are negative except as documented above in ROS and HPI.  ____________________________________________   PHYSICAL EXAM:  VITAL SIGNS: ED Triage Vitals  Enc Vitals Group     BP 07/01/19 1544 95/68     Pulse Rate 07/01/19 1544 (!) 135     Resp 07/01/19 1544 (!) 22     Temp 07/01/19 1544 98.7 F (37.1 C)     Temp Source 07/01/19 1544 Oral     SpO2 07/01/19 1543 94 %     Weight 07/01/19 1545 178 lb (80.7 kg)     Height 07/01/19 1545 5' 0.75" (1.543 m)     Head Circumference --      Peak Flow --      Pain Score 07/01/19 1545 0     Pain Loc --      Pain Edu? --      Excl. in [place]? --     Vital signs reviewed, nursing assessments reviewed. Skin exam limited by patient unwillingness to be evaluated in the ED, uncooperative with exam  Constitutional:   Alert and oriented. Non-toxic appearance. Eyes:   Conjunctivae are normal. EOMI.  ENT      Head:   Normocephalic and atraumatic.      Nose:   Wearing a mask.      Mouth/Throat:   Wearing a mask.      Neck:    Full ROM. Musculoskeletal:   Normal  range of motion in all extremities.  Neurologic:   Normal speech and language.  Normal gait Motor grossly intact. No acute focal neurologic deficits are appreciated.    ____________________________________________    LABS (pertinent positives/negatives) (all labs ordered are listed, but only abnormal results are displayed) Labs Reviewed  GLUCOSE, CAPILLARY - Abnormal; Notable for the following components:      Result Value   Glucose-Capillary 351 (*)  All other components within normal limits   ____________________________________________   EKG    ____________________________________________    RADIOLOGY  No results found.  ____________________________________________   PROCEDURES Procedures  ____________________________________________  DIFFERENTIAL DIAGNOSIS   Dehydration, electrolyte abnormality, DKA, UTI, pneumonia, pulmonary embolism, traumatic injury  CLINICAL IMPRESSION / ASSESSMENT AND PLAN / ED COURSE  Medications ordered in the ED: Medications - No data to display  Pertinent labs & imaging results that were available during my care of the patient were reviewed by me and considered in my medical decision making (see chart for details).  Jone Cannistra was evaluated in Emergency Department on 07/01/2019 for the symptoms described in the history of present illness. She was evaluated in the context of the global COVID-19 pandemic, which necessitated consideration that the patient might be at risk for infection with the SARS-CoV-2 virus that causes COVID-19. Institutional protocols and algorithms that pertain to the evaluation of patients at risk for COVID-19 are in a state of rapid change based on information released by regulatory bodies including the CDC and federal and state organizations. These policies and algorithms were followed during the patient's care in the ED.   Patient presents with tachycardia after a fall at home.  History of  polyuria suggest she is being dehydrated by poor control of her diabetes.  She is clear that she does not wish to be evaluated in the ED at this time despite being transported by EMS.  She has medical decision-making capacity, not psychotic or delusional or suicidal.  I will discharge her home [place].  I relayed my concerns about abnormal electrolytes, pulmonary embolism, infection, dehydration, but she refuses any work-up at this time.  Invited to return to the ED at any point if she wishes to continue her evaluation or has new concerns.      ____________________________________________   FINAL CLINICAL IMPRESSION(S) / ED DIAGNOSES    Final diagnoses:  Fall in home, initial encounter  Type 2 diabetes mellitus with hyperglycemia, without long-term current use of insulin [facility])     ED Discharge Orders    None      Portions of this note were generated with dragon dictation software. Dictation errors may occur despite best attempts at proofreading.   Carrie Mew, MD 07/01/19 1710

## 2019-07-05 ENCOUNTER — Other Ambulatory Visit: Payer: Self-pay

## 2019-07-05 ENCOUNTER — Encounter: Payer: Self-pay | Admitting: Emergency Medicine

## 2019-07-05 ENCOUNTER — Emergency Department: Payer: Medicare HMO

## 2019-07-05 ENCOUNTER — Emergency Department
Admission: EM | Admit: 2019-07-05 | Discharge: 2019-07-06 | Disposition: A | Discharge status: Home / Self Care | Payer: Medicare HMO | Attending: Emergency Medicine | Admitting: Emergency Medicine

## 2019-07-05 DIAGNOSIS — E039 Hypothyroidism, unspecified: Secondary | ICD-10-CM | POA: Diagnosis not present

## 2019-07-05 DIAGNOSIS — Z7409 Other reduced mobility: Secondary | ICD-10-CM | POA: Diagnosis not present

## 2019-07-05 DIAGNOSIS — M545 Low back pain: Secondary | ICD-10-CM | POA: Insufficient documentation

## 2019-07-05 DIAGNOSIS — Z789 Other specified health status: Secondary | ICD-10-CM

## 2019-07-05 DIAGNOSIS — R531 Weakness: Secondary | ICD-10-CM | POA: Insufficient documentation

## 2019-07-05 DIAGNOSIS — Z20822 Contact with and (suspected) exposure to covid-19: Secondary | ICD-10-CM | POA: Insufficient documentation

## 2019-07-05 DIAGNOSIS — W19XXXD Unspecified fall, subsequent encounter: Secondary | ICD-10-CM | POA: Diagnosis not present

## 2019-07-05 DIAGNOSIS — Z87891 Personal history of nicotine dependence: Secondary | ICD-10-CM | POA: Insufficient documentation

## 2019-07-05 DIAGNOSIS — Z79899 Other long term (current) drug therapy: Secondary | ICD-10-CM | POA: Insufficient documentation

## 2019-07-05 DIAGNOSIS — I10 Essential (primary) hypertension: Secondary | ICD-10-CM | POA: Diagnosis not present

## 2019-07-05 DIAGNOSIS — Z7984 Long term (current) use of oral hypoglycemic drugs: Secondary | ICD-10-CM | POA: Diagnosis not present

## 2019-07-05 DIAGNOSIS — E119 Type 2 diabetes mellitus without complications: Secondary | ICD-10-CM | POA: Insufficient documentation

## 2019-07-05 LAB — COMPREHENSIVE METABOLIC PANEL
ALT: 90 U/L — ABNORMAL HIGH (ref 0–44)
AST: 89 U/L — ABNORMAL HIGH (ref 15–41)
Albumin: 3.6 g/dL (ref 3.5–5.0)
Alkaline Phosphatase: 60 U/L (ref 38–126)
Anion gap: 8 (ref 5–15)
BUN: 24 mg/dL — ABNORMAL HIGH (ref 8–23)
CO2: 27 mmol/L (ref 22–32)
Calcium: 9.1 mg/dL (ref 8.9–10.3)
Chloride: 100 mmol/L (ref 98–111)
Creatinine, Ser: 1.28 mg/dL — ABNORMAL HIGH (ref 0.44–1.00)
GFR calc Af Amer: 48 mL/min — ABNORMAL LOW (ref >60)
GFR calc non Af Amer: 41 mL/min — ABNORMAL LOW (ref >60)
Glucose, Bld: 213 mg/dL — ABNORMAL HIGH (ref 70–99)
Potassium: 4.1 mmol/L (ref 3.5–5.1)
Sodium: 135 mmol/L (ref 135–145)
Total Bilirubin: 0.3 mg/dL (ref 0.3–1.2)
Total Protein: 7.1 g/dL (ref 6.5–8.1)

## 2019-07-05 LAB — CBC WITH DIFFERENTIAL/PLATELET
Abs Immature Granulocytes: 0.03 10*3/uL (ref 0.00–0.07)
Basophils Absolute: 0.1 10*3/uL (ref 0.0–0.1)
Basophils Relative: 1 %
Eosinophils Absolute: 0.3 10*3/uL (ref 0.0–0.5)
Eosinophils Relative: 6 %
HCT: 42.9 % (ref 36.0–46.0)
Hemoglobin: 14 g/dL (ref 12.0–15.0)
Immature Granulocytes: 1 %
Lymphocytes Relative: 41 %
Lymphs Abs: 2.1 10*3/uL (ref 0.7–4.0)
MCH: 28.8 pg (ref 26.0–34.0)
MCHC: 32.6 g/dL (ref 30.0–36.0)
MCV: 88.3 fL (ref 80.0–100.0)
Monocytes Absolute: 0.3 10*3/uL (ref 0.1–1.0)
Monocytes Relative: 7 %
Neutro Abs: 2.2 10*3/uL (ref 1.7–7.7)
Neutrophils Relative %: 44 %
Platelets: 189 10*3/uL (ref 150–400)
RBC: 4.86 MIL/uL (ref 3.87–5.11)
RDW: 13 % (ref 11.5–15.5)
WBC: 5 10*3/uL (ref 4.0–10.5)
nRBC: 0 % (ref 0.0–0.2)

## 2019-07-05 LAB — RESPIRATORY PANEL BY RT PCR (FLU A&B, COVID)
Influenza A by PCR: NEGATIVE
Influenza B by PCR: NEGATIVE
SARS Coronavirus 2 by RT PCR: NEGATIVE

## 2019-07-05 LAB — VALPROIC ACID LEVEL: Valproic Acid Lvl: 69 ug/mL (ref 50.0–100.0)

## 2019-07-05 IMAGING — CR DG CHEST 1V PORT
1 series · 1 of 1 positions shown · non-contrast
Comparison: [DATE]

CLINICAL DATA: Recent fall, weakness

EXAM:
PORTABLE CHEST 1 VIEW

[dg chest port 1 view]
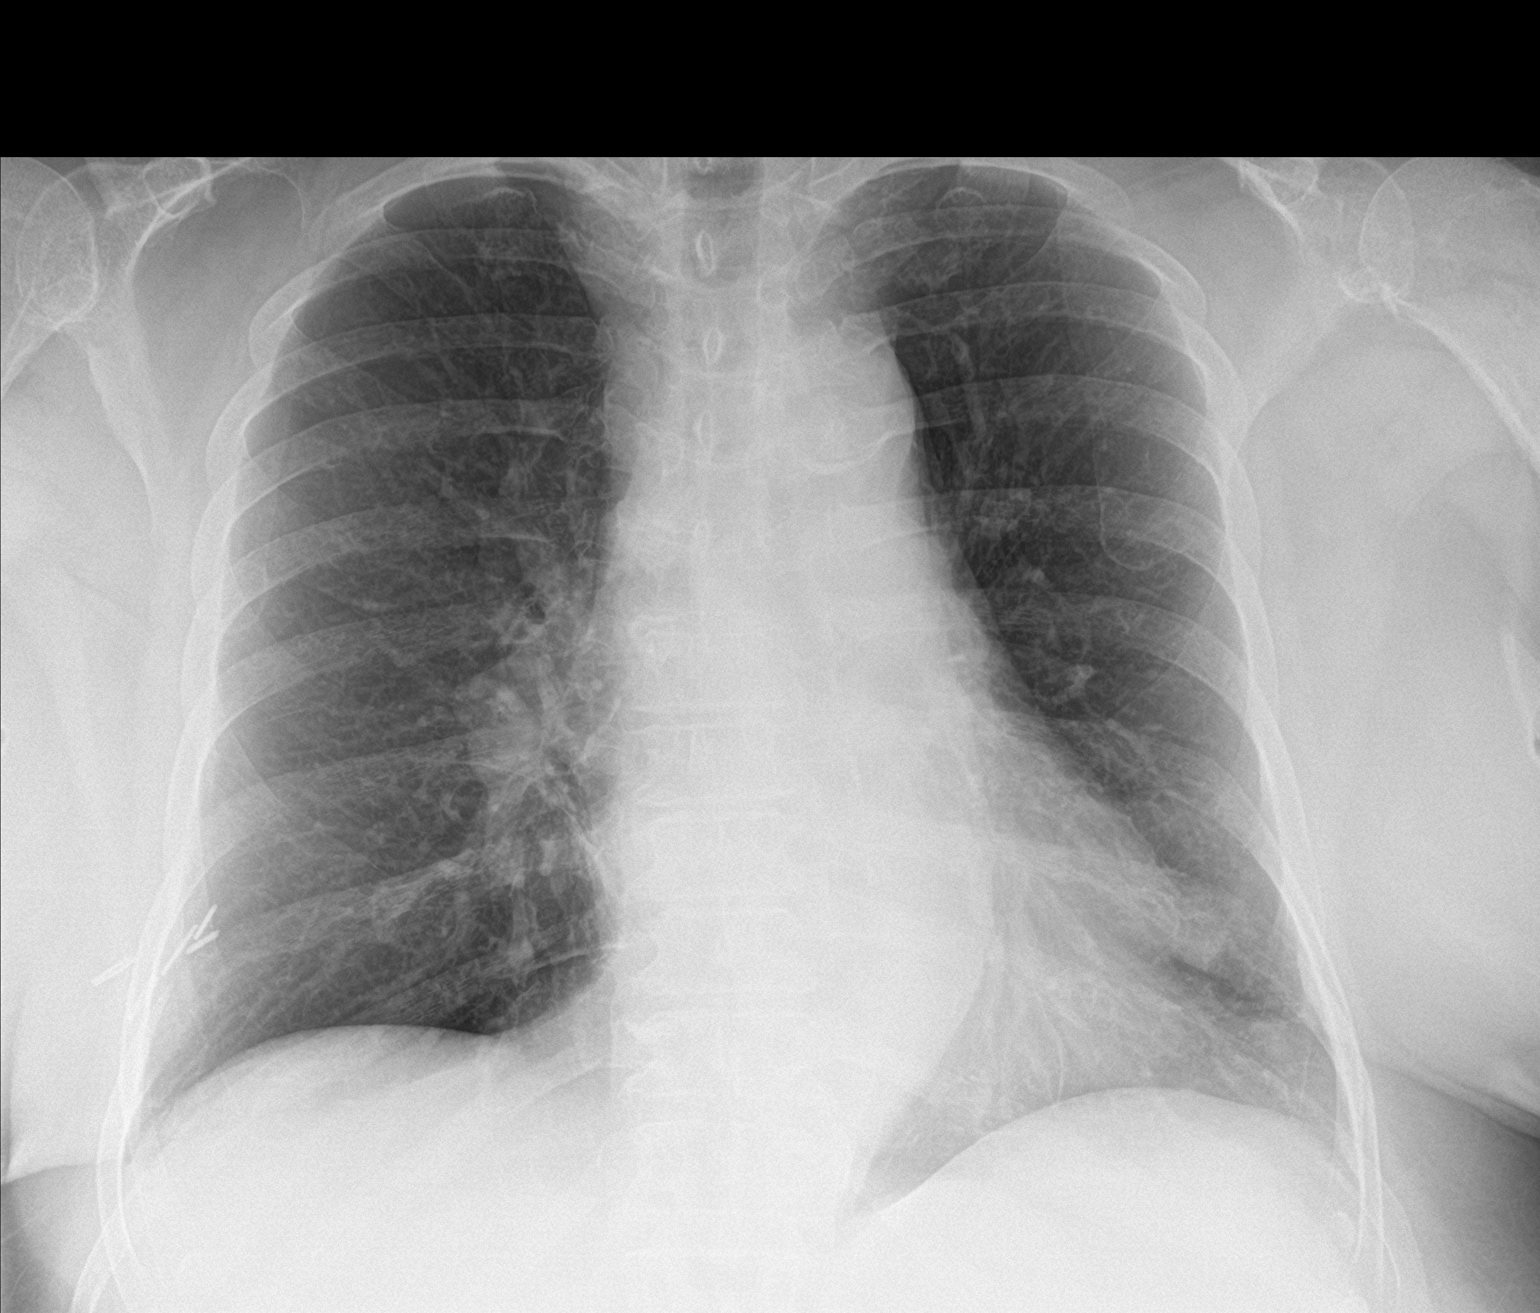

[1 of 1 positions shown; findings below may reference images not displayed]

FINDINGS: The heart size and mediastinal contours are within normal limits.
Both lungs are clear. The visualized skeletal structures are
unremarkable.
IMPRESSION: No active disease.

## 2019-07-05 IMAGING — CR DG LUMBAR SPINE COMPLETE 4+V
1 series · 5 of 5 positions shown · non-contrast
Comparison: None.

CLINICAL DATA: Low back pain.  Recent fall.  Initial encounter.

EXAM:
LUMBAR SPINE - COMPLETE 4+ VIEW

[Series 1: dg lumbar spine complete 4 +v · 0.14mm/px · 5 of 5 slices shown]
[im 1/5]
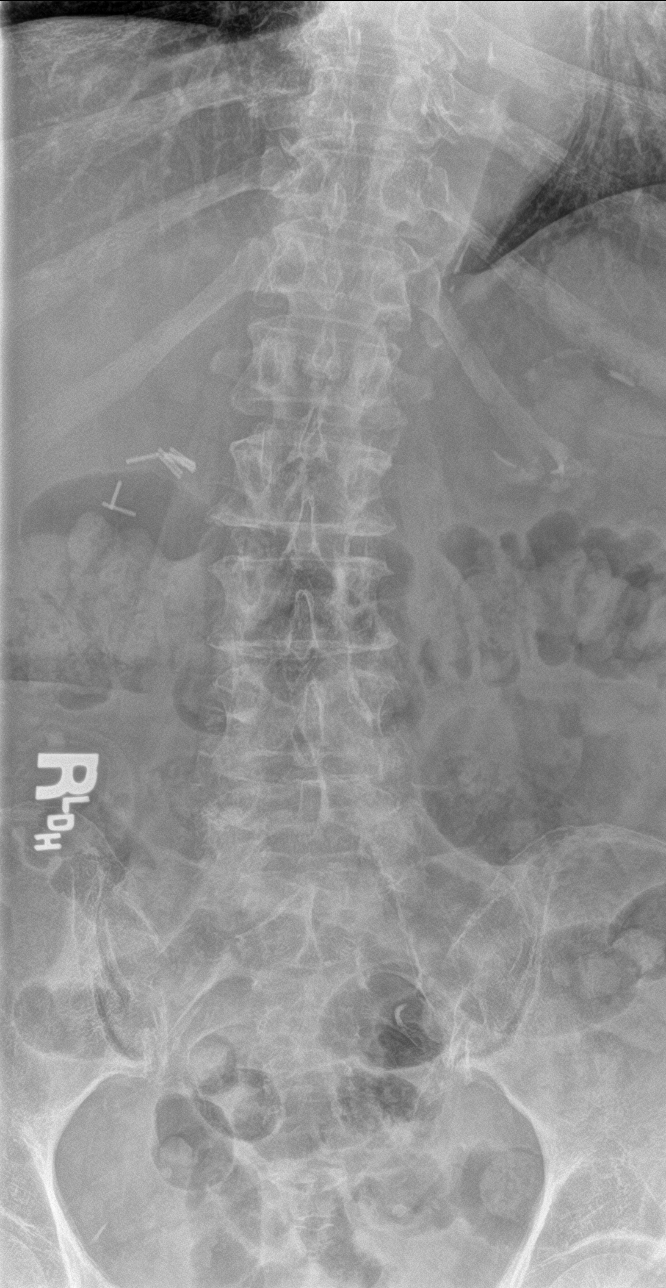
[im 2/5]
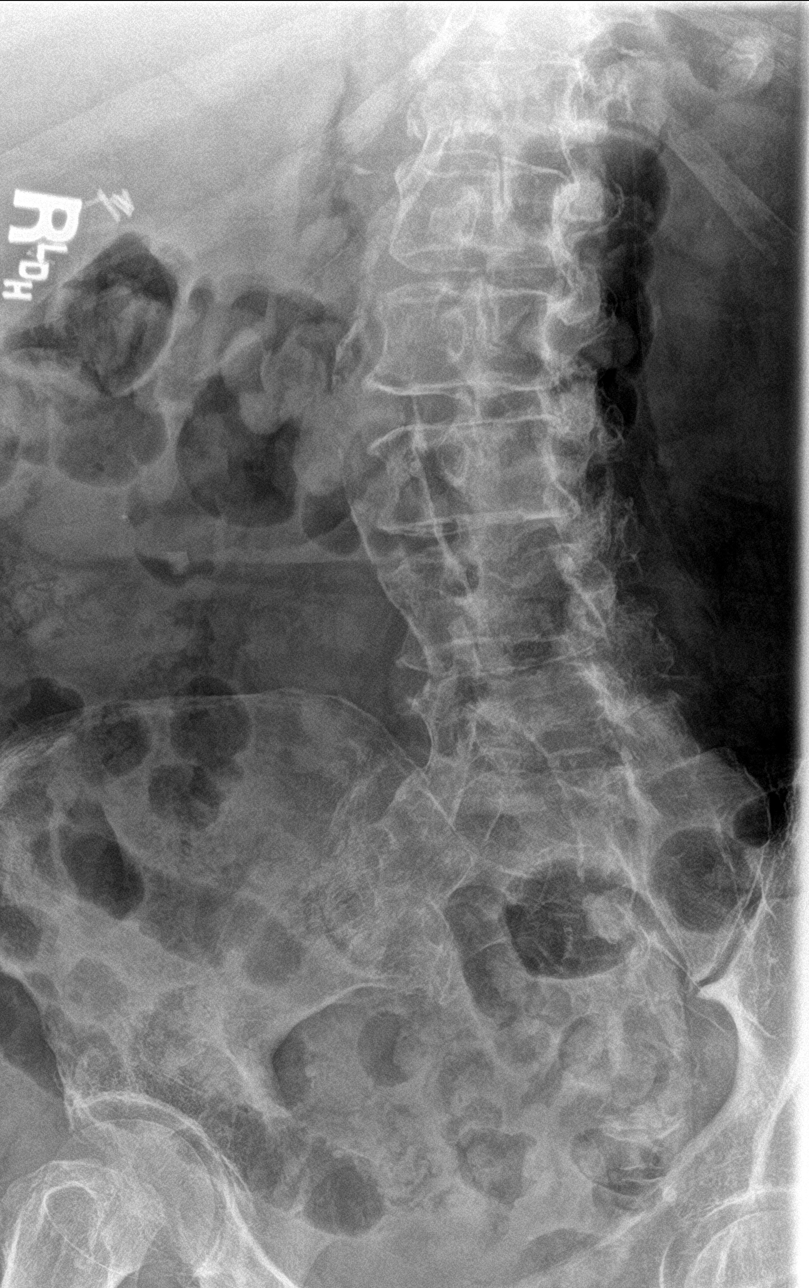
[im 3/5]
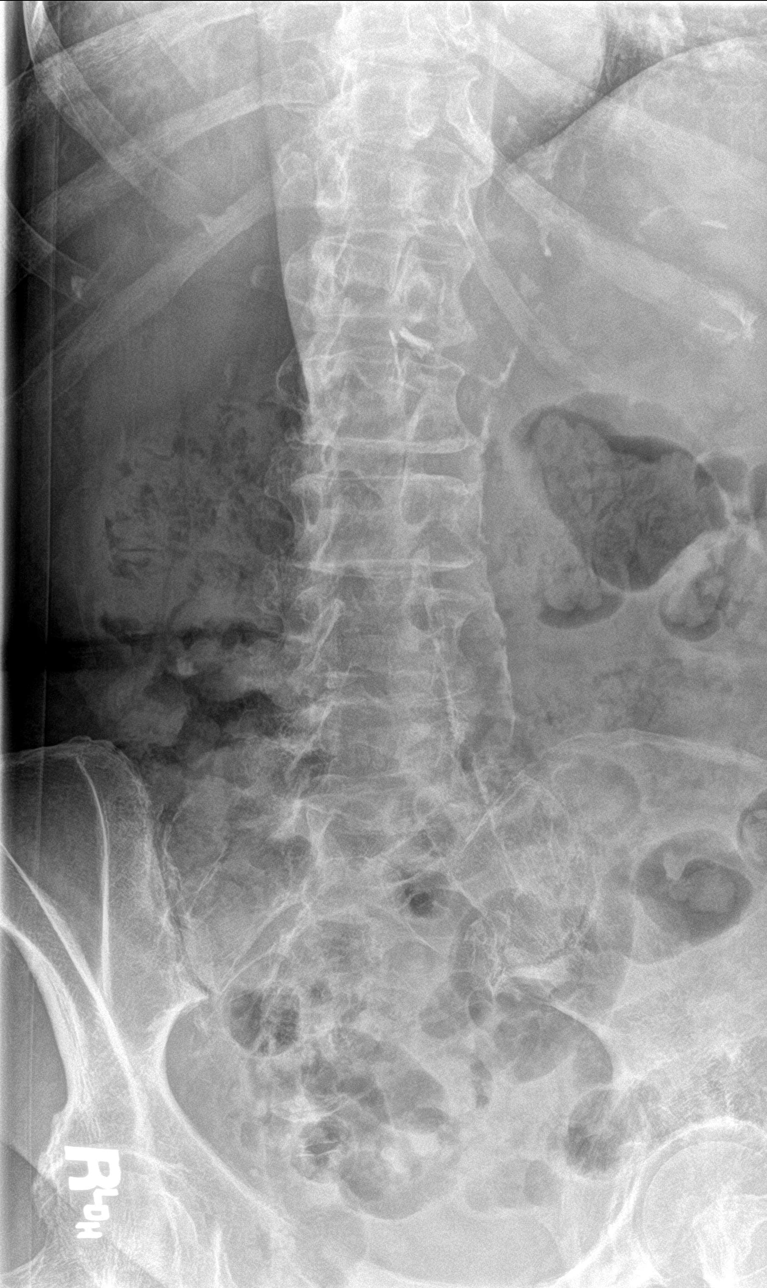
[im 4/5]
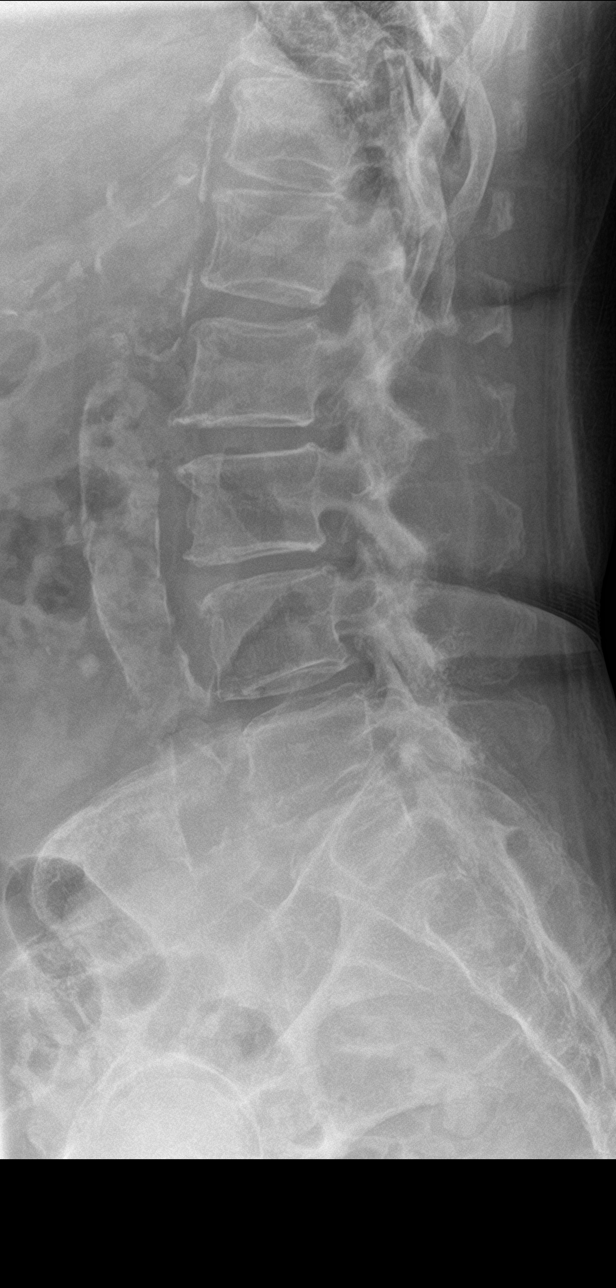
[im 5/5]
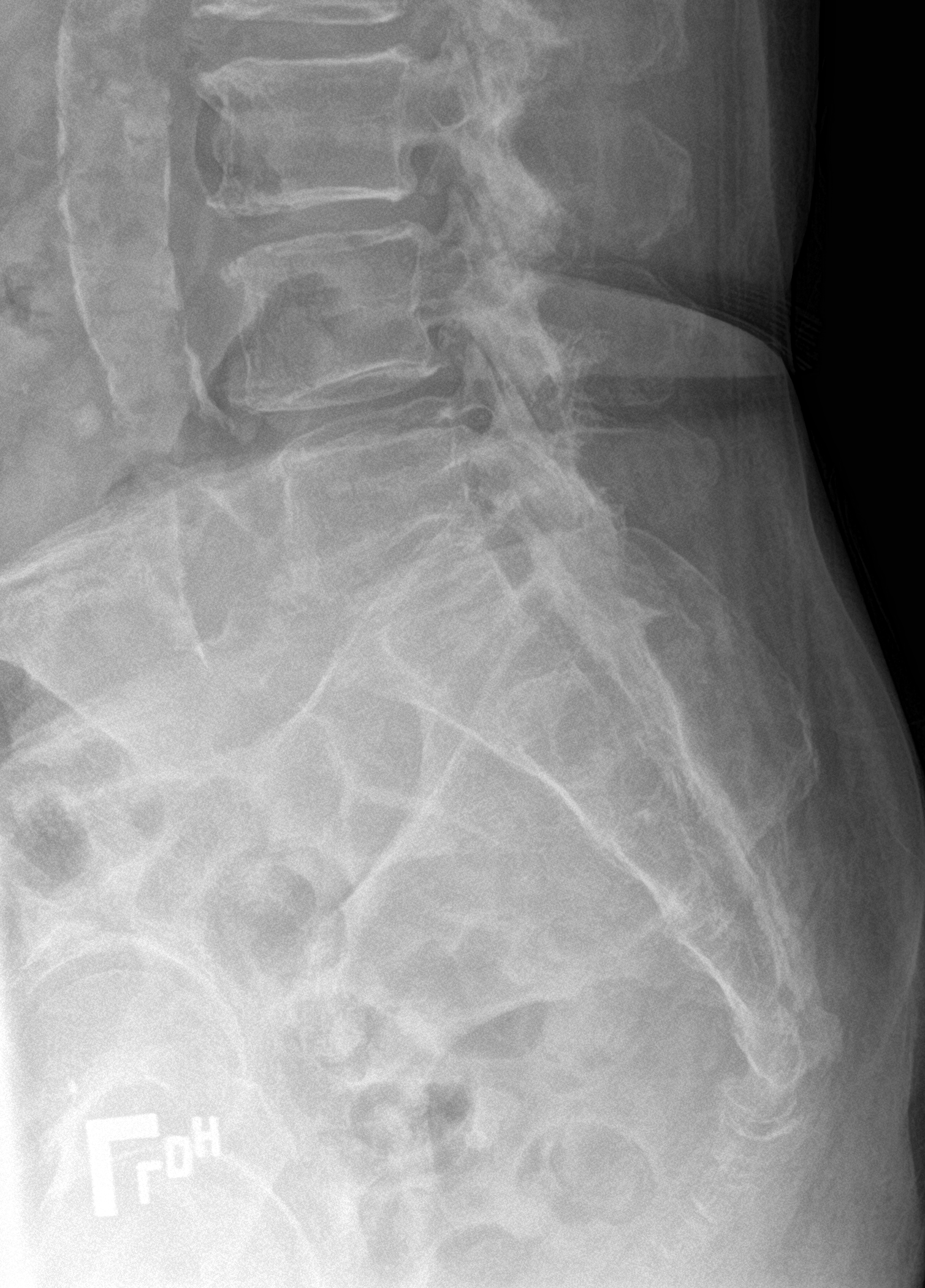

[5 of 5 positions shown; findings below may reference images not displayed]

FINDINGS: There is no evidence of lumbar spine fracture. Alignment is normal.
Intervertebral disc spaces are maintained. Multilevel facet
arthropathy noted. Atherosclerosis.
IMPRESSION: No acute abnormality.

Multilevel facet degenerative disease.

Atherosclerosis.

## 2019-07-05 IMAGING — CR DG SACRUM/COCCYX 2+V
1 series · 3 of 3 positions shown · non-contrast
Comparison: None.

CLINICAL DATA: Multiple falls, back pain

EXAM:
SACRUM AND COCCYX - 2+ VIEW

[Series 1: dg sacrum/coccyx · 0.14mm/px · 3 of 3 slices shown]
[im 1/3]
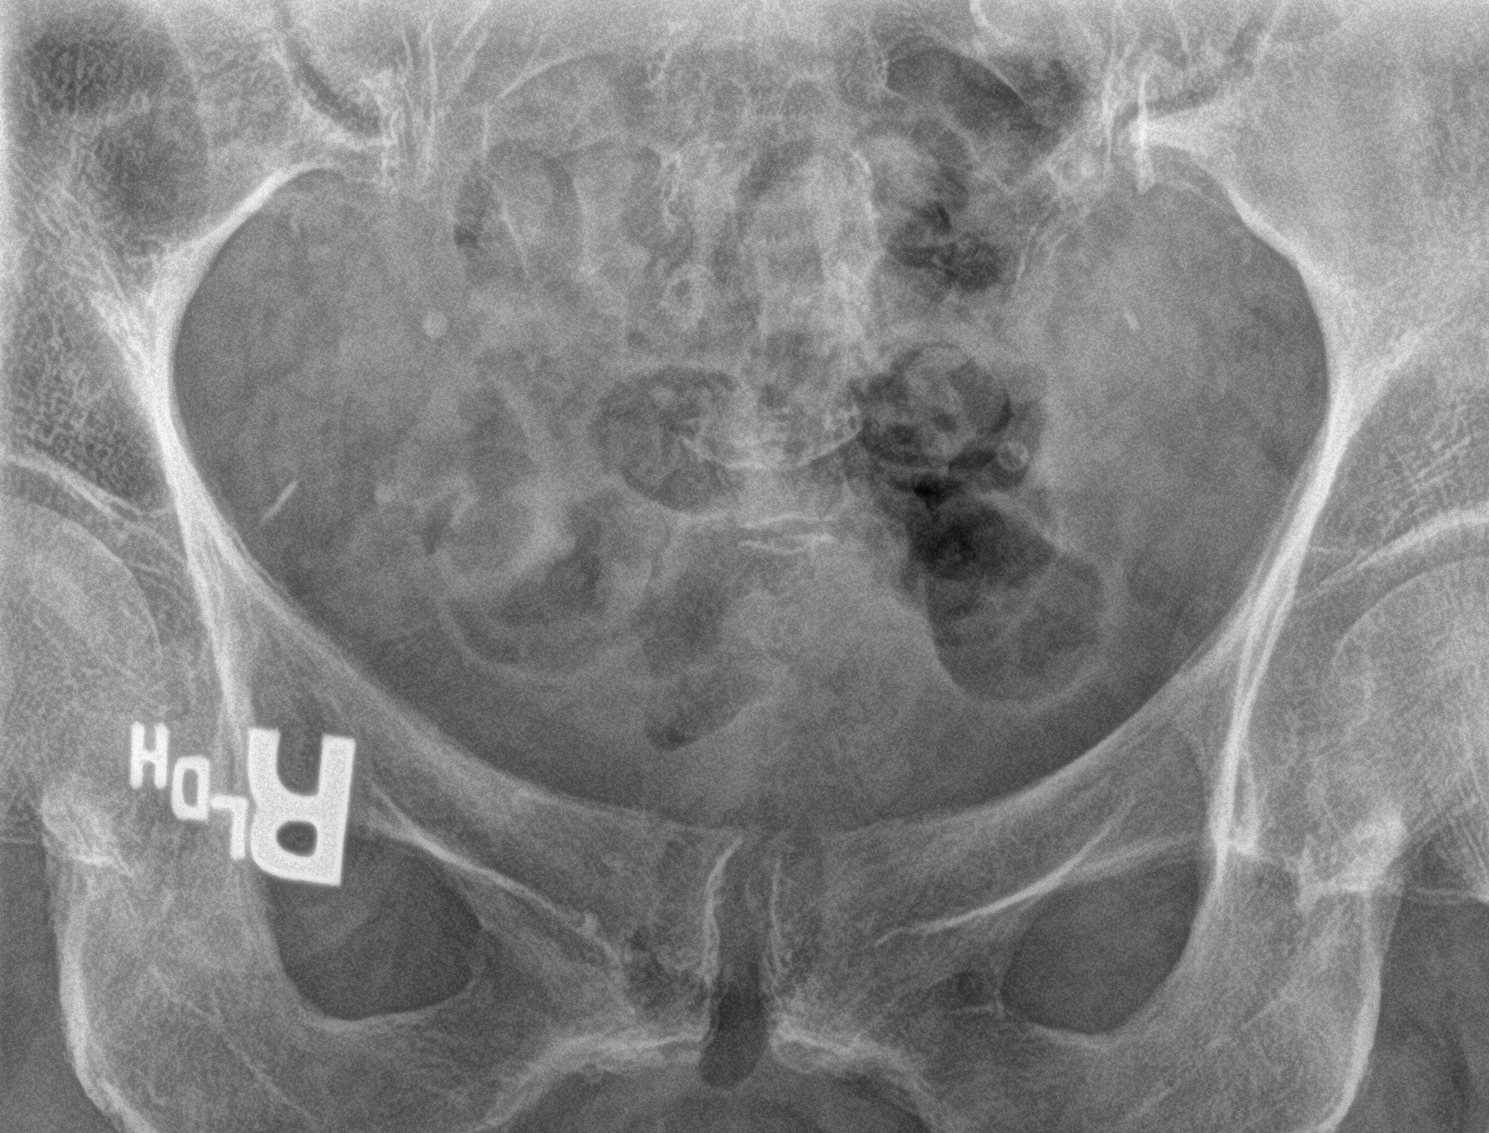
[im 2/3]
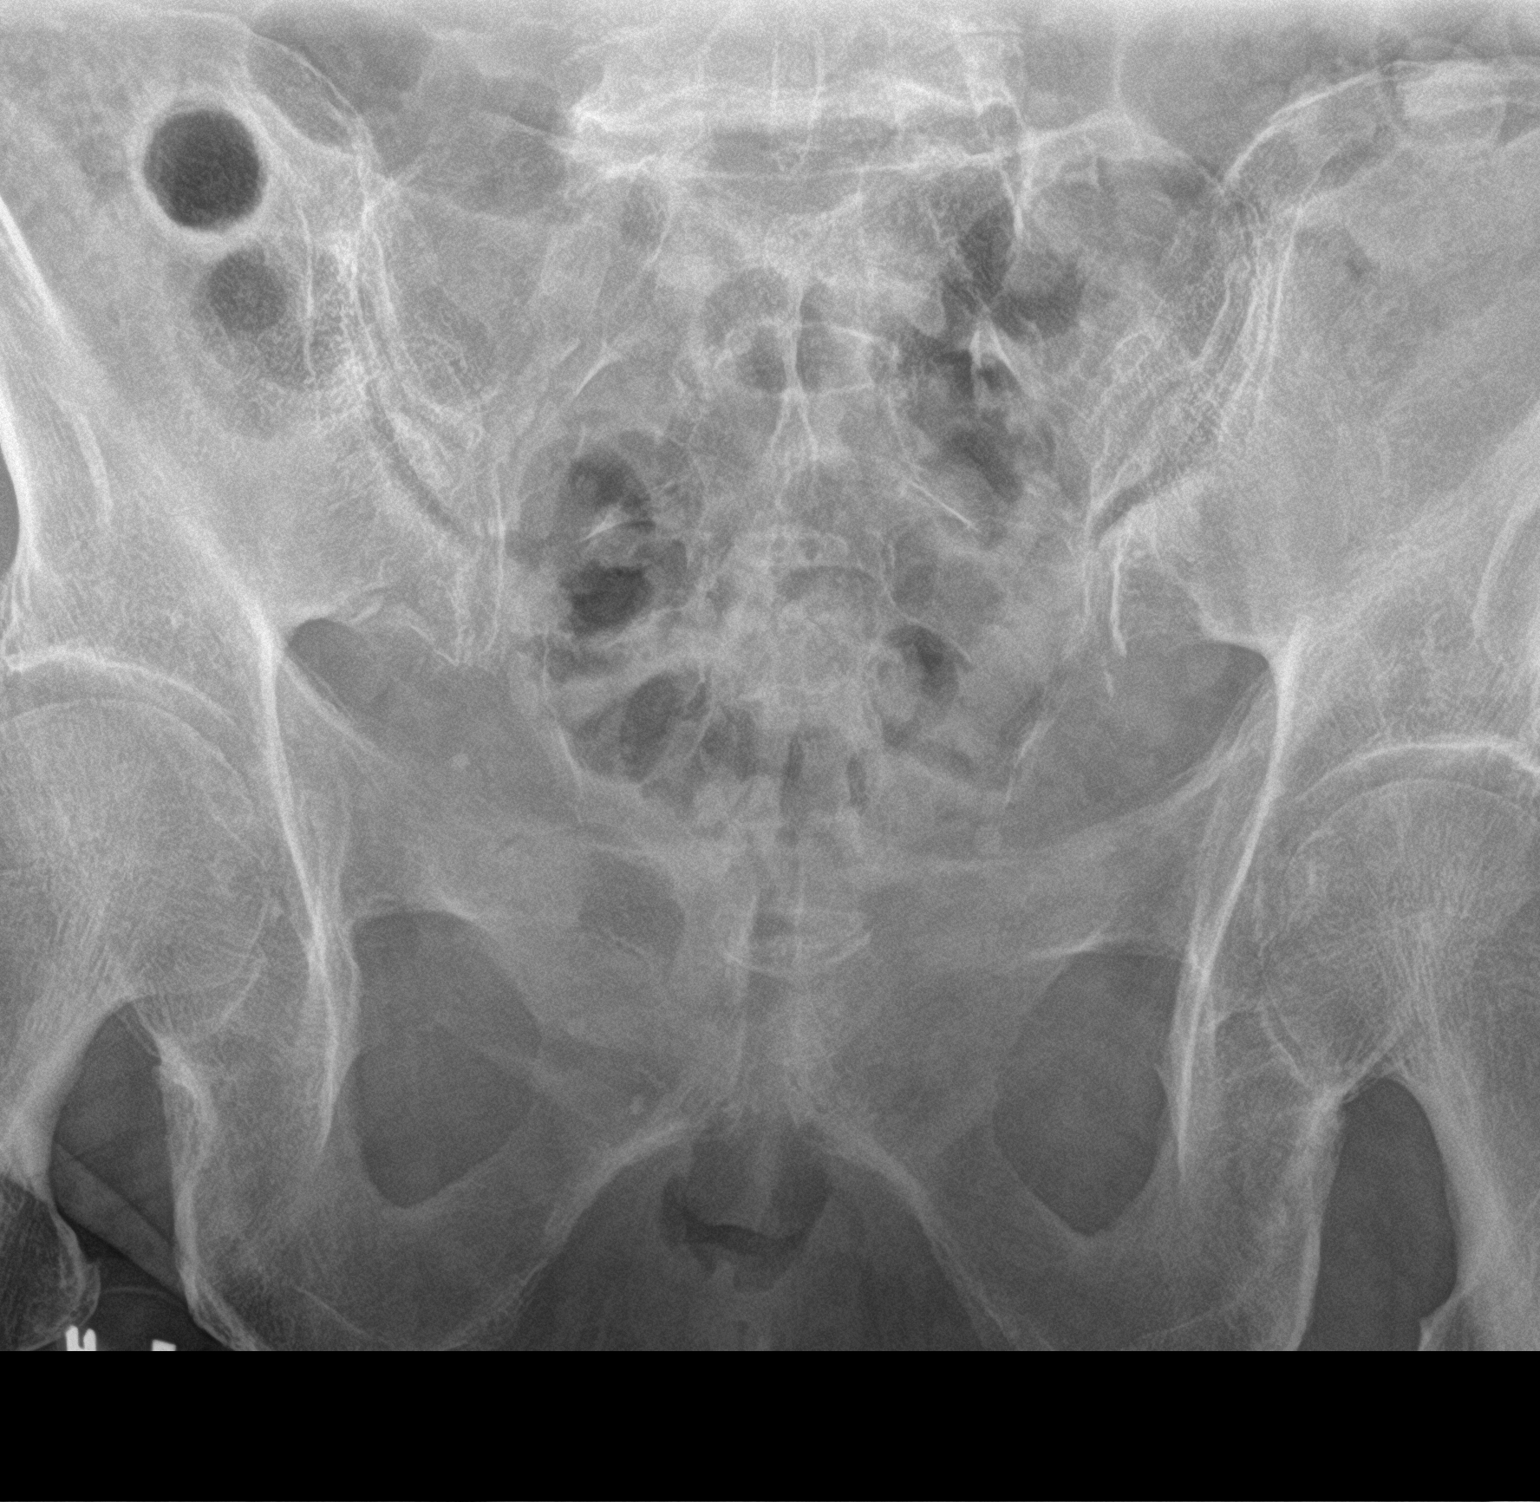
[im 3/3]
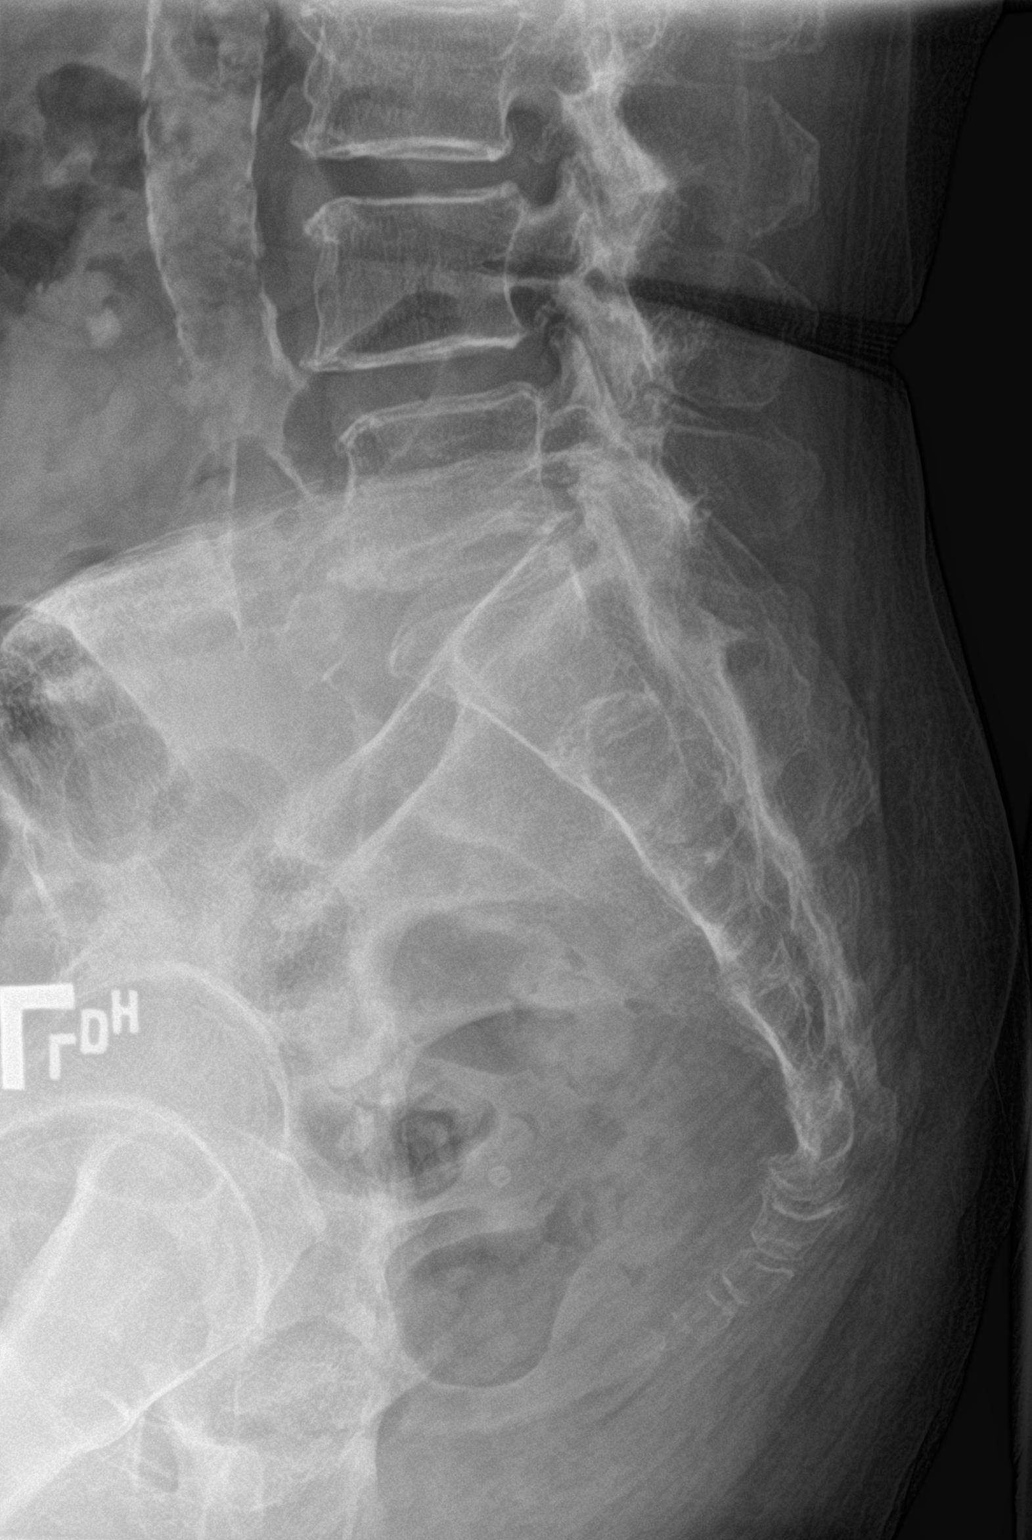

[3 of 3 positions shown; findings below may reference images not displayed]

FINDINGS: Frontal and lateral views of the sacrum and coccyx are obtained.
There are no acute displaced fractures. Alignment is anatomic.
Prominent spondylosis at the L5/S1 level. There is diffuse facet
hypertrophy at all visualized levels of the lumbar spine. Sacroiliac
joints are normal. Visualized portions of the bony pelvis
demonstrate no acute findings.
IMPRESSION: 1. Unremarkable sacrum and coccyx.
2. Prominent degenerative changes at the lumbosacral junction.

## 2019-07-05 MED ORDER — ROSUVASTATIN CALCIUM 20 MG PO TABS
40.0000 mg | ORAL_TABLET | Freq: Every day | ORAL | Status: DC
  Filled 2019-07-05: qty 2

## 2019-07-05 MED ORDER — LURASIDONE HCL 40 MG PO TABS
20.0000 mg | ORAL_TABLET | Freq: Every day | ORAL | Status: DC
  Administered 2019-07-05: 21:00:00 20 mg via ORAL
  Filled 2019-07-05 (×2): qty 1

## 2019-07-05 MED ORDER — FLUOXETINE HCL 20 MG PO CAPS
40.0000 mg | ORAL_CAPSULE | Freq: Every day | ORAL | Status: DC
  Administered 2019-07-06: 40 mg via ORAL
  Filled 2019-07-05: qty 2

## 2019-07-05 MED ORDER — OLOPATADINE HCL 0.1 % OP SOLN
1.0000 [drp] | Freq: Every day | OPHTHALMIC | Status: DC
  Filled 2019-07-05: qty 5

## 2019-07-05 MED ORDER — ENALAPRIL MALEATE 10 MG PO TABS
5.0000 mg | ORAL_TABLET | Freq: Two times a day (BID) | ORAL | Status: DC
  Administered 2019-07-05 – 2019-07-06 (×2): 5 mg via ORAL
  Filled 2019-07-05 (×2): qty 1

## 2019-07-05 MED ORDER — GLIPIZIDE ER 10 MG PO TB24
10.0000 mg | ORAL_TABLET | Freq: Two times a day (BID) | ORAL | Status: DC
  Administered 2019-07-05 – 2019-07-06 (×2): 10 mg via ORAL
  Filled 2019-07-05 (×3): qty 1

## 2019-07-05 MED ORDER — LORATADINE 10 MG PO TABS: 10.0000 mg | ORAL_TABLET | Freq: Every day | ORAL | Status: DC

## 2019-07-05 MED ORDER — DIVALPROEX SODIUM ER 250 MG PO TB24
500.0000 mg | ORAL_TABLET | Freq: Every day | ORAL | Status: DC
  Administered 2019-07-05: 500 mg via ORAL
  Filled 2019-07-05: qty 2

## 2019-07-05 MED ORDER — CLONAZEPAM 0.5 MG PO TABS
1.0000 mg | ORAL_TABLET | Freq: Every day | ORAL | Status: DC
  Administered 2019-07-05: 21:00:00 1 mg via ORAL
  Filled 2019-07-05: qty 2

## 2019-07-05 MED ORDER — TRAZODONE HCL 100 MG PO TABS: 100.0000 mg | ORAL_TABLET | Freq: Every day | ORAL | Status: DC

## 2019-07-05 MED ORDER — LINAGLIPTIN 5 MG PO TABS
5.0000 mg | ORAL_TABLET | Freq: Every day | ORAL | Status: DC
  Administered 2019-07-06: 10:00:00 5 mg via ORAL
  Filled 2019-07-05: qty 1

## 2019-07-05 MED ORDER — LEVOTHYROXINE SODIUM 88 MCG PO TABS
88.0000 ug | ORAL_TABLET | Freq: Every day | ORAL | Status: DC
  Filled 2019-07-05: qty 1

## 2019-07-05 NOTE — ED Provider Notes (Signed)
[facility] Emergency Department Provider Note  ____________________________________________   First MD Initiated Contact with Patient 07/05/19 1504     (approximate)  I have reviewed the triage vital signs and the nursing notes.   HISTORY  Chief Complaint Fall    HPI [name] is a 74 y.o. female with past medical history as below here with multiple complaints.  The patient states that over the last several days, she has felt progressively more weak.  She has been hospitalized multiple times in the last several weeks, initially for psychiatric reasons and then she was seen again recently for a fall.  She fell due to acute on chronic pain in her legs which limits her mobility.  She states that since the fall, she separates worsening lower back and sacral pain.  She states that she has had progressive pain and weakness with ambulation and now essentially has been unable to leave her house due to pain as well as weakness, as well as somewhat increased confusion.  She declined evaluation during her recent ED visit because she had to return home, but states that she had returned to get medications but that her medications, which include her diabetes medications and a new psychiatric medication, have not come.  She denies any suicidal homicidal ideation, but does admit she is not sleeping well.  She reports that every time she tries to walk, she feels weak and feels aching, throbbing pain in her lower back.  She admits this is not necessarily acute, but progressively worse now limiting her ability to take care of herself at home.  She cannot cook for herself or even walk outside of the home.  She has no forms of transportation.  No other complaints.  No fevers or chills.        Past Medical History:  Diagnosis Date  . Arthritis   . Breast cancer ([place])   . Cancer [facility]) 2004   right breast ca  . Diabetes mellitus without complication ([place])   .  Hypercholesterolemia   . Hypertension   . Hypothyroid   . Seasonal allergies     Patient Active Problem List   Diagnosis Date Noted  . MDD (major depressive disorder), recurrent episode, severe ([place]) 06/24/2019  . Pain due to onychomycosis of toenails of both feet 11/23/2018  . Bipolar 1 disorder, manic, moderate ([place]) 07/21/2018  . Bipolar 1 disorder, mixed, severe ([place]) 07/21/2018  . Bipolar 1 disorder with moderate mania ([place]) 07/20/2018  . Bipolar I disorder, most recent episode depressed with anxious distress ([place]) 07/07/2017  . PTSD (post-traumatic stress disorder) 07/07/2017  . 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 Diabetes ([place]) 09/23/2014  . Essential hypertension 09/21/2014  . Hypothyroidism 09/21/2014  . Dyslipidemia 09/21/2014  . Bipolar I disorder, current or most recent episode manic, severe with mixed features ([place]) 09/20/2014    Past Surgical History:  Procedure Laterality Date  . ABDOMINAL HYSTERECTOMY    . BREAST BIOPSY Left    neg  . BREAST EXCISIONAL BIOPSY Right 2004   breast ca and lumpectomy  . BREAST LUMPECTOMY Right 2004   breast ca  . CHOLECYSTECTOMY    . COLONOSCOPY WITH PROPOFOL N/A 02/13/2017   Procedure: COLONOSCOPY WITH PROPOFOL;  Surgeon: Jonathon Bellows, MD;  Location: [facility] ENDOSCOPY;  Service: Gastroenterology;  Laterality: N/A;  . KIDNEY STONE SURGERY      Prior to Admission medications   Medication Sig Start Date End Date Taking? Authorizing  Provider  clonazePAM (KLONOPIN) 1 MG tablet Take 1 tablet (1 mg total) by mouth at bedtime. 07/27/18   Clapacs, Madie Reno, MD  divalproex (DEPAKOTE ER) 500 MG 24 hr tablet Take 2 tablets (1,000 mg total) by mouth at bedtime. 07/27/18   Clapacs, Madie Reno, MD  enalapril (VASOTEC) 5 MG tablet Take 1 tablet (5 mg total) by mouth 2 (two) times daily. 06/29/19   Starkes-Perry, Gayland Curry, FNP  FLUoxetine (PROZAC) 40 MG capsule Take 1 capsule (40 mg total) by mouth daily. 07/28/18    Clapacs, Madie Reno, MD  glipiZIDE (GLUCOTROL XL) 5 MG 24 hr tablet Take 2 tablets by mouth 2 (two) times daily. 06/21/19   [provider]  levothyroxine (SYNTHROID) 88 MCG tablet Take 88 mcg by mouth daily before breakfast.  11/09/18   [provider]  linagliptin (TRADJENTA) 5 MG TABS tablet Take 1 tablet (5 mg total) by mouth daily. 06/30/19   Starkes-Perry, Gayland Curry, FNP  loratadine (CLARITIN) 10 MG tablet Take 1 tablet (10 mg total) by mouth daily. 07/27/18   Clapacs, Madie Reno, MD  lurasidone (LATUDA) 20 MG TABS tablet Take 1 tablet (20 mg total) by mouth daily with supper. 06/29/19   Starkes-Perry, Gayland Curry, FNP  PAZEO 0.7 % SOLN Take 1 drop by mouth daily.  08/25/18   [provider]  rosuvastatin (CRESTOR) 40 MG tablet Take 40 mg by mouth daily. 05/12/19   [provider]  traZODone (DESYREL) 100 MG tablet Take 1 tablet (100 mg total) by mouth at bedtime. 06/29/19   Suella Broad, FNP    Allergies Navane [thiothixene] and Penicillins  Family History  Problem Relation Age of Onset  . Bladder Cancer Neg Hx   . Kidney cancer Neg Hx     Social History Social History   Tobacco Use  . Smoking status: Former Smoker    Types: Cigarettes  . Smokeless tobacco: Never Used  Substance Use Topics  . Alcohol use: No  . Drug use: No    Review of Systems  Review of Systems  Constitutional: Positive for fatigue. Negative for fever.  HENT: Negative for congestion and sore throat.   Eyes: Negative for visual disturbance.  Respiratory: Negative for cough and shortness of breath.   Cardiovascular: Negative for chest pain.  Gastrointestinal: Negative for abdominal pain, diarrhea, nausea and vomiting.  Genitourinary: Negative for flank pain.  Musculoskeletal: Positive for arthralgias and gait problem. Negative for back pain and neck pain.  Skin: Negative for rash and wound.  Neurological: Positive for weakness.  All other systems reviewed and are negative.     ____________________________________________  PHYSICAL EXAM:      VITAL SIGNS: ED Triage Vitals  Enc Vitals Group     BP 07/05/19 1156 112/65     Pulse Rate 07/05/19 1156 62     Resp 07/05/19 1156 18     Temp 07/05/19 1156 98 F (36.7 C)     Temp Source 07/05/19 1156 Oral     SpO2 07/05/19 1156 95 %     Weight 07/05/19 1157 180 lb (81.6 kg)     Height 07/05/19 1157 5' 0.75" (1.543 m)     Head Circumference --      Peak Flow --      Pain Score 07/05/19 1157 4     Pain Loc --      Pain Edu? --      Excl. in [place]? --      Physical Exam Vitals  and nursing note reviewed.  Constitutional:      General: She is not in acute distress.    Appearance: She is well-developed.  HENT:     Head: Normocephalic and atraumatic.  Eyes:     Conjunctiva/sclera: Conjunctivae normal.  Cardiovascular:     Rate and Rhythm: Normal rate and regular rhythm.     Heart sounds: Normal heart sounds.  Pulmonary:     Effort: Pulmonary effort is normal. No respiratory distress.     Breath sounds: No wheezing.  Abdominal:     General: There is no distension.  Musculoskeletal:     Cervical back: Neck supple.     Comments: Moderate midline lower lumbar and paraspinal tenderness  Skin:    General: Skin is warm.     Capillary Refill: Capillary refill takes less than 2 seconds.     Findings: No rash.  Neurological:     Mental Status: She is alert and oriented to person, place, and time.     Motor: No abnormal muscle tone.  Psychiatric:     Comments: Appears anxious, somewhat tearful, but denies SI or HI       ____________________________________________   LABS (all labs ordered are listed, but only abnormal results are displayed)  Labs Reviewed  COMPREHENSIVE METABOLIC PANEL - Abnormal; Notable for the following components:      Result Value   Glucose, Bld 213 (*)    BUN 24 (*)    Creatinine, Ser 1.28 (*)    AST 89 (*)    ALT 90 (*)    GFR calc non Af Amer 41 (*)    GFR calc Af Amer  48 (*)    All other components within normal limits  URINALYSIS, COMPLETE (UACMP) WITH MICROSCOPIC - Abnormal; Notable for the following components:   Color, Urine STRAW (*)    APPearance CLEAR (*)    Glucose, UA >=500 (*)    Bacteria, UA RARE (*)    All other components within normal limits  RESPIRATORY PANEL BY RT PCR (FLU A&B, COVID)  CBC WITH DIFFERENTIAL/PLATELET  VALPROIC ACID LEVEL  URINE DRUG SCREEN, QUALITATIVE ([facility] ONLY)    ____________________________________________  EKG: Sinus bradycaria, VR 52. PR 214, QRS 90, QTc 470. No acute st elevations or depressions. No ischemia. ________________________________________  RADIOLOGY All imaging, including plain films, CT scans, and ultrasounds, independently reviewed by me, and interpretations confirmed via formal radiology reads.  ED MD interpretation:   CXR: clear XR Spine: Neg DG Sacrum: Neg  Official radiology report(s): DG Lumbar Spine Complete  Result Date: 07/05/2019 CLINICAL DATA:  Low back pain.  Recent fall.  Initial encounter. EXAM: LUMBAR SPINE - COMPLETE 4+ VIEW COMPARISON:  None. FINDINGS: There is no evidence of lumbar spine fracture. Alignment is normal. Intervertebral disc spaces are maintained. Multilevel facet arthropathy noted. Atherosclerosis. IMPRESSION: No acute abnormality. Multilevel facet degenerative disease. Atherosclerosis. Electronically Signed   By: Inge Rise M.D.   On: 07/05/2019 16:12   DG Sacrum/Coccyx  Result Date: 07/05/2019 CLINICAL DATA:  Multiple falls, back pain EXAM: SACRUM AND COCCYX - 2+ VIEW COMPARISON:  None. FINDINGS: Frontal and lateral views of the sacrum and coccyx are obtained. There are no acute displaced fractures. Alignment is anatomic. Prominent spondylosis at the L5/S1 level. There is diffuse facet hypertrophy at all visualized levels of the lumbar spine. Sacroiliac joints are normal. Visualized portions of the bony pelvis demonstrate no acute findings. IMPRESSION: 1.  Unremarkable sacrum and coccyx. 2. Prominent degenerative changes at the  lumbosacral junction. Electronically Signed   By: Randa Ngo M.D.   On: 07/05/2019 16:13   DG Chest Portable 1 View  Result Date: 07/05/2019 CLINICAL DATA:  Recent fall, weakness EXAM: PORTABLE CHEST 1 VIEW COMPARISON:  06/23/2019 FINDINGS: The heart size and mediastinal contours are within normal limits. Both lungs are clear. The visualized skeletal structures are unremarkable. IMPRESSION: No active disease. Electronically Signed   By: Randa Ngo M.D.   On: 07/05/2019 16:12    ____________________________________________  PROCEDURES   Procedure(s) performed (including Critical Care):  Procedures  ____________________________________________  INITIAL IMPRESSION / MDM / [place] / ED COURSE  As part of my medical decision making, I reviewed the following data within the [place] notes reviewed and incorporated, Old chart reviewed, Notes from prior ED visits, and [place] Controlled Substance Database       *Kahealani Anvita Joynt was evaluated in Emergency Department on 07/06/2019 for the symptoms described in the history of present illness. She was evaluated in the context of the global COVID-19 pandemic, which necessitated consideration that the patient might be at risk for infection with the SARS-CoV-2 virus that causes COVID-19. Institutional protocols and algorithms that pertain to the evaluation of patients at risk for COVID-19 are in a state of rapid change based on information released by regulatory bodies including the CDC and federal and state organizations. These policies and algorithms were followed during the patient's care in the ED.  Some ED evaluations and interventions may be delayed as a result of limited staffing during the pandemic.*  Clinical Course as of Jul 05 128  Mon Jul 05, 2019  1650 74 yo F here with general fatigue, inability to care for self, and  back pain. Re: back pain - this is acute on chronic 2/2 recent falls. No LE weakness, numbness, or signs of cauda equina or cord compression or acute radiculopathy. Plain films neg. Treat supportively. Re: weakness - labs pending. Suspect this is more so 2/2 deconditioning, age. Denies any SI, HI though she was recently hospitalized with Psych. Will have PT/OT, SW assess and pt is adamant she cannot care for herself, and I see no indication for admission at this time.   [CI]  [id number] Lab work shows very minimal AST and ALT elevation, but otherwise is essentially unremarkable.  She may be mildly dehydrated but is tolerating p.o. here.  Depakote level is normal.  Respiratory panel negative.  CBC unremarkable.  Plain films negative.  Will await social work and physical therapy evaluation.   [CI]  1838 Home meds reordered.   [CI]    Clinical Course User Index [CI] Duffy Bruce, MD    Medical Decision Making:  As above. 74 yo F here needing assistance with ADL/iADLs. No apparent acute medical emergency or indication for admission. Social work consulted for placement.  ____________________________________________  FINAL CLINICAL IMPRESSION(S) / ED DIAGNOSES  Final diagnoses:  Immobility  Unable to care for self     MEDICATIONS GIVEN DURING THIS VISIT:  Medications  lurasidone (LATUDA) tablet 20 mg (20 mg Oral Given 07/05/19 2120)  traZODone (DESYREL) tablet 100 mg (has no administration in time range)  rosuvastatin (CRESTOR) tablet 40 mg (has no administration in time range)  Olopatadine HCl 0.7 % SOLN 1 drop (has no administration in time range)  loratadine (CLARITIN) tablet 10 mg (has no administration in time range)  linagliptin (TRADJENTA) tablet 5 mg (has no administration in time range)  levothyroxine (SYNTHROID) tablet 88 mcg (  has no administration in time range)  glipiZIDE (GLUCOTROL XL) 24 hr tablet 10 mg (10 mg Oral Given 07/05/19 2121)  FLUoxetine (PROZAC) capsule 40 mg (has no  administration in time range)  enalapril (VASOTEC) tablet 5 mg (5 mg Oral Given 07/05/19 2118)  divalproex (DEPAKOTE ER) 24 hr tablet 500 mg (500 mg Oral Given 07/05/19 2119)  clonazePAM (KLONOPIN) tablet 1 mg (1 mg Oral Given 07/05/19 2118)     ED Discharge Orders    None       Note:  This document was prepared using Dragon voice recognition software and may include unintentional dictation errors.   Duffy Bruce, MD 07/06/19 0130

## 2019-07-05 NOTE — ED Triage Notes (Signed)
Pt comes into the ED via EMS with c/o recent fall and is now fearful that she will fall again.

## 2019-07-05 NOTE — ED Triage Notes (Signed)
Patient presents to the ED requesting a social work consult.  Patient states she has had frequent falls recently, last fall was 04/29/20.  Patient states she has been having lower back and leg pain.  Patient states she does not feel she can continue to live in her apartment because she cannot walk to get the mail or walk to the dumpster.  Patient states she lives alone and she cannot cook or stand up long enough to wash dishes.  Patient states she feels like she also may need physical therapy.  Patient states, "I'm on disability and I don't have very much money and I don't know what options are available for me."

## 2019-07-06 DIAGNOSIS — R531 Weakness: Secondary | ICD-10-CM | POA: Diagnosis not present

## 2019-07-06 LAB — URINE DRUG SCREEN, QUALITATIVE (ARMC ONLY)
Amphetamines, Ur Screen: NOT DETECTED
Barbiturates, Ur Screen: NOT DETECTED
Benzodiazepine, Ur Scrn: NOT DETECTED
Cannabinoid 50 Ng, Ur ~~LOC~~: NOT DETECTED
Cocaine Metabolite,Ur ~~LOC~~: NOT DETECTED
MDMA (Ecstasy)Ur Screen: NOT DETECTED
Methadone Scn, Ur: NOT DETECTED
Opiate, Ur Screen: NOT DETECTED
Phencyclidine (PCP) Ur S: NOT DETECTED
Tricyclic, Ur Screen: NOT DETECTED

## 2019-07-06 LAB — URINALYSIS, COMPLETE (UACMP) WITH MICROSCOPIC
Bilirubin Urine: NEGATIVE
Glucose, UA: >=500 mg/dL — AB
Hgb urine dipstick: NEGATIVE
Ketones, ur: NEGATIVE mg/dL
Leukocytes,Ua: NEGATIVE
Nitrite: NEGATIVE
Protein, ur: NEGATIVE mg/dL
Specific Gravity, Urine: 1.005 (ref 1.005–1.030)
pH: 6 (ref 5.0–8.0)

## 2019-07-06 LAB — GLUCOSE, CAPILLARY: Glucose-Capillary: 174 mg/dL — ABNORMAL HIGH (ref 70–99)

## 2019-07-06 NOTE — Discharge Instructions (Addendum)
Thank you for letting us take care of you in the emergency department today.   Please continue to take any regular, prescribed medications. Please use the walker provided to help with stability and walking.   Please follow up with: - Your primary care doctor to review your ER visit and follow up on your symptoms.   Please return to the ER for any new or worsening symptoms.

## 2019-07-06 NOTE — ED Provider Notes (Signed)
-----------------------------------------   6:41 AM on 07/06/2019 -----------------------------------------   Blood pressure 103/83, pulse (!) 50, temperature 98.7 F (37.1 C), resp. rate 16, height 1.543 m (5' 0.75"), weight 81.6 kg, SpO2 90 %.  The patient is calm and cooperative at this time.  There have been no acute events since the last update.  Awaiting disposition plan from Social Work team(s) after PT/OT evaluation.   Hinda Kehr, MD 07/06/19 [phone]

## 2019-07-06 NOTE — ED Notes (Signed)
PT ambulated to toilet with assistance. PT urinated on herself and on the floor in route to the toilet. Pt linens changed on bed and new gown and socks provided. PT assisted back to bed

## 2019-07-06 NOTE — TOC Initial Note (Addendum)
Transition of Care [facility]) - Initial/Assessment Note    Patient Details  Name: [name] MRN: [id number] Date of Birth: 12/11/1945  Transition of Care [facility]) CM/SW Contact:    Anselm Pancoast, RN Phone Number: 07/06/2019, 9:36 AM  Clinical Narrative:                 Patient lives alone and concerned about increased weakness over the last couple weeks. Patient requiring some assistance with ADLs and interested in home health care or possible SNF. Patient is anxious about going into facility and being exposed to [place]. RN CM discussed in detail the difference and option of Home Health care for nursing and PT. Will await PT eval and recommendation. Insurance company pays for transportation to medical appointments and patient has taxi take her to grocery store however has been to weak to go shopping recently.   Expected Discharge Plan: [place]     Patient Goals and CMS Choice Patient states their goals for this hospitalization and ongoing recovery are:: Get stronger and get safe      Expected Discharge Plan and Services Expected Discharge Plan: [place]       Living arrangements for the past 2 months: Single Family Home                                      Prior Living Arrangements/Services Living arrangements for the past 2 months: Single Family Home Lives with:: Self Patient language and need for interpreter reviewed:: Yes Do you feel safe going back to the place where you live?: Yes      Need for Family Participation in Patient Care: No (Comment) Care giver support system in place?: Yes (comment)   Criminal Activity/Legal Involvement Pertinent to Current Situation/Hospitalization: No - Comment as needed  Activities of Daily Living      Permission Sought/Granted                  Emotional Assessment Appearance:: Appears stated age Attitude/Demeanor/Rapport: Engaged Affect (typically observed):  Accepting Orientation: : Oriented to Self, Oriented to Place, Oriented to  Time, Oriented to Situation Alcohol / Substance Use: Never Used Psych Involvement: No (comment)  Admission diagnosis:  EMS- fear of falling Patient Active Problem List   Diagnosis Date Noted  . MDD (major depressive disorder), recurrent episode, severe ([place]) 06/24/2019  . Pain due to onychomycosis of toenails of both feet 11/23/2018  . Bipolar 1 disorder, manic, moderate ([place]) 07/21/2018  . Bipolar 1 disorder, mixed, severe ([place]) 07/21/2018  . Bipolar 1 disorder with moderate mania ([place]) 07/20/2018  . Bipolar I disorder, most recent episode depressed with anxious distress ([place]) 07/07/2017  . PTSD (post-traumatic stress disorder) 07/07/2017  . 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 Diabetes ([place]) 09/23/2014  . Essential hypertension 09/21/2014  . Hypothyroidism 09/21/2014  . Dyslipidemia 09/21/2014  . Bipolar I disorder, current or most recent episode manic, severe with mixed features ([place]) 09/20/2014   PCP:  Casilda Carls, MD Pharmacy:   [facility] [address], [place] - [place] [address] Phone: [phone] Fax: [place], [place] - [place] [address] Phone: [phone] Fax: [phone]     Social Determinants of Health (SDOH) Interventions    Readmission Risk  Interventions No flowsheet data found.

## 2019-07-06 NOTE — Evaluation (Signed)
Physical Therapy Evaluation Patient Details Name: [name] MRN: [id number] DOB: 12-Oct-1945 Today's Date: 07/06/2019   History of Present Illness  Per MD notes: Pt is a 74 y.o. female with past medical history that includes breast CA, DM, HTN, depression, bipolar disorder, and PTSD. The patient states that over the last several days she has felt progressively more weak and has been hospitalized multiple times in the last several weeks, initially for psychiatric reasons and then she was seen again recently for a fall.  Pt reported worsening weakness and lower back and sacral pain since falls with all imaging negative.    Clinical Impression  Pt pleasant and motivated to participate during the session.  Pt required min A with bed mobility as well as min A to prevent posterior LOB upon initial stand from sitting.  Pt ambulated with very slow cadence and short B step length but was steady without LOB once moving.  Pt tended to pick the walker up to advance it similar to her SW that she has at home but demonstrated good carryover after practice using the walker correctly.  Pt has an extensive h/o falls and appears from gathering her history to be limiting her activity secondary to fear of falling.  Pt is at a high risk for continued falls and functional decline and will benefit from PT services in a SNF setting upon discharge to safely address deficits listed in patient problem list.      Follow Up Recommendations SNF    Equipment Recommendations  Rolling walker with 5" wheels    Recommendations for Other Services       Precautions / Restrictions Precautions Precautions: Fall Restrictions Weight Bearing Restrictions: No      Mobility  Bed Mobility Overal bed mobility: Needs Assistance Bed Mobility: Supine to Sit;Sit to Supine     Supine to sit: Min assist Sit to supine: Supervision   General bed mobility comments: Min A for trunk control during sup to sit and extra  time/effort during sit to sup  Transfers Overall transfer level: Needs assistance Equipment used: Rolling walker (2 wheeled) Transfers: Sit to/from Stand Sit to Stand: From elevated surface;Min assist         General transfer comment: Min instability upon initial stand with min A to correct  Ambulation/Gait Ambulation/Gait assistance: Min guard Gait Distance (Feet): 30 Feet Assistive device: Rolling walker (2 wheeled) Gait Pattern/deviations: Step-through pattern;Decreased step length - right;Decreased step length - left Gait velocity: decreased   General Gait Details: Slow cadence with short B step length but steady without LOB; Mod verbal cues for proper sequencing with the RW during gait training  Stairs            Wheelchair Mobility    Modified Rankin (Stroke Patients Only)       Balance Overall balance assessment: Needs assistance   Sitting balance-Leahy Scale: Poor Sitting balance - Comments: Posterior instability in sitting Postural control: Posterior lean Standing balance support: Bilateral upper extremity supported Standing balance-Leahy Scale: Poor Standing balance comment: Min A to correct posterior LOB upon initial stand from sitting                             Pertinent Vitals/Pain Pain Assessment: No/denies pain    Home Living Family/patient expects to be discharged to:: Private residence Living Arrangements: Alone Available Help at Discharge: Other (Comment)(No assistance available) Type of Home: Apartment Home Access: Stairs to enter Entrance Stairs-Rails:  None Entrance Stairs-Number of Steps: 1 small step to access apartment area Home Layout: One level Home Equipment: Clinical cytogeneticist - standard Additional Comments: Pt is borrowing SW    Prior Function Level of Independence: Independent with assistive device(s)         Comments: Mod Ind amb mostly HH distances with a SW and/or furniture cruising; pt endorses 5-6 falls  in the last 6 months from LOB and weakness     Hand Dominance        Extremity/Trunk Assessment   Upper Extremity Assessment Upper Extremity Assessment: Generalized weakness    Lower Extremity Assessment Lower Extremity Assessment: Generalized weakness       Communication   Communication: No difficulties  Cognition Arousal/Alertness: Awake/alert Behavior During Therapy: WFL for tasks assessed/performed Overall Cognitive Status: Within Functional Limits for tasks assessed                                        General Comments      Exercises Total Joint Exercises Ankle Circles/Pumps: AROM;Strengthening;Both;10 reps Quad Sets: Strengthening;Both;10 reps Gluteal Sets: Strengthening;Both;10 reps Towel Squeeze: Strengthening;Both;10 reps Hip ABduction/ADduction: Strengthening;Both;10 reps Long Arc Quad: Strengthening;Both;10 reps Knee Flexion: Strengthening;Both;10 reps Marching in Standing: AROM;Both;10 reps;Standing;Seated Other Exercises Other Exercises: HEP education for BLE APs, QS, GS, and LAQs x 10 each every 1-2 hours daily   Assessment/Plan    PT Assessment Patient needs continued PT services  PT Problem List Decreased strength;Decreased activity tolerance;Decreased balance;Decreased mobility;Decreased knowledge of use of DME       PT Treatment Interventions DME instruction;Gait training;Stair training;Functional mobility training;Therapeutic activities;Therapeutic exercise;Balance training;Patient/family education    PT Goals (Current goals can be found in the Care Plan section)  Acute Rehab PT Goals Patient Stated Goal: To get stronger with better balance PT Goal Formulation: With patient Time For Goal Achievement: 07/19/19 Potential to Achieve Goals: Good    Frequency Min 2X/week   Barriers to discharge Inaccessible home environment;Decreased caregiver support No assistance at home    Co-evaluation               AM-PAC PT  "6 Clicks" Mobility  Outcome Measure Help needed turning from your back to your side while in a flat bed without using bedrails?: A Little Help needed moving from lying on your back to sitting on the side of a flat bed without using bedrails?: A Little Help needed moving to and from a bed to a chair (including a wheelchair)?: A Little Help needed standing up from a chair using your arms (e.g., wheelchair or bedside chair)?: A Little Help needed to walk in hospital room?: A Little Help needed climbing 3-5 steps with a railing? : A Lot 6 Click Score: 17    End of Session Equipment Utilized During Treatment: Gait belt Activity Tolerance: Patient tolerated treatment well Patient left: in bed;with call bell/phone within reach;Other (comment)(ER bed rails up as pt was found) Nurse Communication: Mobility status;Other (comment)(SpO2 91-92% at rest down to 88% with Amb) PT Visit Diagnosis: Unsteadiness on feet (R26.81);History of falling (Z91.81);Repeated falls (R29.6);Difficulty in walking, not elsewhere classified (R26.2);Muscle weakness (generalized) (M62.81)    Time: [id number] PT Time Calculation (min) (ACUTE ONLY): 32 min   Charges:   PT Evaluation $PT Eval Moderate Complexity: 1 Mod PT Treatments $Therapeutic Exercise: 8-22 mins       D. Royetta Asal PT, DPT 07/06/19, 12:22 PM

## 2019-07-06 NOTE — ED Provider Notes (Signed)
SW/CM has assisted with set up of home health resources.  Patient ambulating with walker as recommended by PT.  She is stable for discharge, patient agreeable w/ plan.   Lilia Pro., MD 07/06/19 [phone]

## 2019-07-06 NOTE — ED Notes (Signed)
Pt given breakfast tray

## 2019-07-06 NOTE — TOC Transition Note (Signed)
Transition of Care [facility]) - CM/SW Discharge Note   Patient Details  Name: Betheny Crahan MRN: [id number] Date of Birth: Jan 12, 1946  [facility]) CM/SW Contact:  Anselm Pancoast, RN Phone Number: 07/06/2019, 11:48 AM   Clinical Narrative:    Discharging home with home health via [facility] for PT, Nursing and Aide services as well as walker. Patient states she will call herself a cab to get home.          Patient Goals and CMS Choice Patient states their goals for this hospitalization and ongoing recovery are:: Get stronger and get safe      Discharge Placement                       Discharge Plan and Services                                     Social Determinants of Health (SDOH) Interventions     Readmission Risk Interventions No flowsheet data found.

## 2019-07-06 NOTE — TOC Progression Note (Addendum)
Transition of Care [facility]) - Progression Note    Patient Details  Name: [name] MRN: [id number] Date of Birth: 05-Aug-1945  [facility]) CM/SW Contact  Anselm Pancoast, RN Phone Number: 07/06/2019, 11:24 AM  Clinical Narrative:    RN CM talked with patient and discussed PT recommendations. Patient is requesting home with home health care due to concerns of COVID in a facility. Patient requesting [facility] for home health. RN CM will arrange home health care via [facility]. Patient requesting walker.    Expected Discharge Plan: [place]    Expected Discharge Plan and Services Expected Discharge Plan: [place]       Living arrangements for the past 2 months: Single Family Home                                       Social Determinants of Health (SDOH) Interventions    Readmission Risk Interventions No flowsheet data found.

## 2019-08-10 ENCOUNTER — Inpatient Hospital Stay
Admission: RE | Admit: 2019-08-10 | Discharge: 2019-08-13 | DRG: 885 | Disposition: A | Discharge status: Home Health | Payer: Medicare HMO | Source: Intra-hospital | Attending: Psychiatry | Admitting: Psychiatry

## 2019-08-10 ENCOUNTER — Other Ambulatory Visit: Payer: Self-pay

## 2019-08-10 ENCOUNTER — Encounter: Payer: Self-pay | Admitting: Psychiatry

## 2019-08-10 ENCOUNTER — Emergency Department
Admission: EM | Admit: 2019-08-10 | Discharge: 2019-08-10 | Disposition: A | Discharge status: Other Acute Inpatient Hospital | Payer: Medicare HMO | Attending: Emergency Medicine | Admitting: Emergency Medicine

## 2019-08-10 ENCOUNTER — Encounter: Payer: Self-pay | Admitting: Emergency Medicine

## 2019-08-10 DIAGNOSIS — Z79899 Other long term (current) drug therapy: Secondary | ICD-10-CM | POA: Insufficient documentation

## 2019-08-10 DIAGNOSIS — F4310 Post-traumatic stress disorder, unspecified: Secondary | ICD-10-CM | POA: Insufficient documentation

## 2019-08-10 DIAGNOSIS — Z853 Personal history of malignant neoplasm of breast: Secondary | ICD-10-CM | POA: Diagnosis not present

## 2019-08-10 DIAGNOSIS — I10 Essential (primary) hypertension: Secondary | ICD-10-CM | POA: Insufficient documentation

## 2019-08-10 DIAGNOSIS — R45851 Suicidal ideations: Secondary | ICD-10-CM | POA: Diagnosis present

## 2019-08-10 DIAGNOSIS — F314 Bipolar disorder, current episode depressed, severe, without psychotic features: Secondary | ICD-10-CM | POA: Diagnosis present

## 2019-08-10 DIAGNOSIS — E7800 Pure hypercholesterolemia, unspecified: Secondary | ICD-10-CM | POA: Diagnosis present

## 2019-08-10 DIAGNOSIS — F329 Major depressive disorder, single episode, unspecified: Secondary | ICD-10-CM | POA: Insufficient documentation

## 2019-08-10 DIAGNOSIS — Z888 Allergy status to other drugs, medicaments and biological substances status: Secondary | ICD-10-CM | POA: Diagnosis not present

## 2019-08-10 DIAGNOSIS — E039 Hypothyroidism, unspecified: Secondary | ICD-10-CM | POA: Diagnosis present

## 2019-08-10 DIAGNOSIS — Z7984 Long term (current) use of oral hypoglycemic drugs: Secondary | ICD-10-CM | POA: Insufficient documentation

## 2019-08-10 DIAGNOSIS — Z880 Allergy status to penicillin: Secondary | ICD-10-CM

## 2019-08-10 DIAGNOSIS — M1990 Unspecified osteoarthritis, unspecified site: Secondary | ICD-10-CM | POA: Diagnosis present

## 2019-08-10 DIAGNOSIS — Z20822 Contact with and (suspected) exposure to covid-19: Secondary | ICD-10-CM | POA: Diagnosis not present

## 2019-08-10 DIAGNOSIS — E119 Type 2 diabetes mellitus without complications: Secondary | ICD-10-CM | POA: Diagnosis present

## 2019-08-10 DIAGNOSIS — Z87891 Personal history of nicotine dependence: Secondary | ICD-10-CM

## 2019-08-10 DIAGNOSIS — G4710 Hypersomnia, unspecified: Secondary | ICD-10-CM | POA: Diagnosis present

## 2019-08-10 LAB — CBC WITH DIFFERENTIAL/PLATELET
Abs Immature Granulocytes: 0.07 10*3/uL (ref 0.00–0.07)
Basophils Absolute: 0.1 10*3/uL (ref 0.0–0.1)
Basophils Relative: 1 %
Eosinophils Absolute: 0.3 10*3/uL (ref 0.0–0.5)
Eosinophils Relative: 3 %
HCT: 44.2 % (ref 36.0–46.0)
Hemoglobin: 14.5 g/dL (ref 12.0–15.0)
Immature Granulocytes: 1 %
Lymphocytes Relative: 41 %
Lymphs Abs: 3.8 10*3/uL (ref 0.7–4.0)
MCH: 29.2 pg (ref 26.0–34.0)
MCHC: 32.8 g/dL (ref 30.0–36.0)
MCV: 88.9 fL (ref 80.0–100.0)
Monocytes Absolute: 0.8 10*3/uL (ref 0.1–1.0)
Monocytes Relative: 9 %
Neutro Abs: 4.2 10*3/uL (ref 1.7–7.7)
Neutrophils Relative %: 45 %
Platelets: 218 10*3/uL (ref 150–400)
RBC: 4.97 MIL/uL (ref 3.87–5.11)
RDW: 12.8 % (ref 11.5–15.5)
WBC: 9.3 10*3/uL (ref 4.0–10.5)
nRBC: 0 % (ref 0.0–0.2)

## 2019-08-10 LAB — RESPIRATORY PANEL BY RT PCR (FLU A&B, COVID)
Influenza A by PCR: NEGATIVE
Influenza B by PCR: NEGATIVE
SARS Coronavirus 2 by RT PCR: NEGATIVE

## 2019-08-10 LAB — URINE DRUG SCREEN, QUALITATIVE (ARMC ONLY)
Amphetamines, Ur Screen: NOT DETECTED
Barbiturates, Ur Screen: NOT DETECTED
Benzodiazepine, Ur Scrn: NOT DETECTED
Cannabinoid 50 Ng, Ur ~~LOC~~: NOT DETECTED
Cocaine Metabolite,Ur ~~LOC~~: NOT DETECTED
MDMA (Ecstasy)Ur Screen: NOT DETECTED
Methadone Scn, Ur: NOT DETECTED
Opiate, Ur Screen: NOT DETECTED
Phencyclidine (PCP) Ur S: NOT DETECTED
Tricyclic, Ur Screen: NOT DETECTED

## 2019-08-10 LAB — COMPREHENSIVE METABOLIC PANEL
ALT: 28 U/L (ref 0–44)
AST: 22 U/L (ref 15–41)
Albumin: 4.2 g/dL (ref 3.5–5.0)
Alkaline Phosphatase: 51 U/L (ref 38–126)
Anion gap: 11 (ref 5–15)
BUN: 32 mg/dL — ABNORMAL HIGH (ref 8–23)
CO2: 25 mmol/L (ref 22–32)
Calcium: 9.5 mg/dL (ref 8.9–10.3)
Chloride: 101 mmol/L (ref 98–111)
Creatinine, Ser: 1.1 mg/dL — ABNORMAL HIGH (ref 0.44–1.00)
GFR calc Af Amer: 57 mL/min — ABNORMAL LOW (ref >60)
GFR calc non Af Amer: 49 mL/min — ABNORMAL LOW (ref >60)
Glucose, Bld: 168 mg/dL — ABNORMAL HIGH (ref 70–99)
Potassium: 3.7 mmol/L (ref 3.5–5.1)
Sodium: 137 mmol/L (ref 135–145)
Total Bilirubin: 0.5 mg/dL (ref 0.3–1.2)
Total Protein: 7.6 g/dL (ref 6.5–8.1)

## 2019-08-10 LAB — URINALYSIS, COMPLETE (UACMP) WITH MICROSCOPIC
Bacteria, UA: NONE SEEN
Bilirubin Urine: NEGATIVE
Glucose, UA: >=500 mg/dL — AB
Hgb urine dipstick: NEGATIVE
Ketones, ur: 5 mg/dL — AB
Leukocytes,Ua: NEGATIVE
Nitrite: NEGATIVE
Protein, ur: NEGATIVE mg/dL
Specific Gravity, Urine: 1.025 (ref 1.005–1.030)
pH: 6 (ref 5.0–8.0)

## 2019-08-10 LAB — ETHANOL: Alcohol, Ethyl (B): <10 mg/dL (ref <10)

## 2019-08-10 LAB — SALICYLATE LEVEL: Salicylate Lvl: <7 mg/dL — ABNORMAL LOW (ref 7.0–30.0)

## 2019-08-10 LAB — ACETAMINOPHEN LEVEL: Acetaminophen (Tylenol), Serum: <10 ug/mL — ABNORMAL LOW (ref 10–30)

## 2019-08-10 LAB — GLUCOSE, CAPILLARY: Glucose-Capillary: 224 mg/dL — ABNORMAL HIGH (ref 70–99)

## 2019-08-10 MED ORDER — LEVOTHYROXINE SODIUM 88 MCG PO TABS
88.0000 ug | ORAL_TABLET | Freq: Every day | ORAL | Status: DC
  Administered 2019-08-11 – 2019-08-13 (×3): 88 ug via ORAL
  Filled 2019-08-10 (×3): qty 1

## 2019-08-10 MED ORDER — INSULIN ASPART 100 UNIT/ML ~~LOC~~ SOLN
0.0000 [IU] | Freq: Three times a day (TID) | SUBCUTANEOUS | Status: DC
  Administered 2019-08-11: 3 [IU] via SUBCUTANEOUS
  Administered 2019-08-11: 5 [IU] via SUBCUTANEOUS
  Administered 2019-08-11: 21:00:00 2 [IU] via SUBCUTANEOUS
  Administered 2019-08-11 – 2019-08-12 (×3): 3 [IU] via SUBCUTANEOUS
  Administered 2019-08-12 – 2019-08-13 (×3): 5 [IU] via SUBCUTANEOUS
  Filled 2019-08-10 (×9): qty 1

## 2019-08-10 MED ORDER — DIVALPROEX SODIUM ER 500 MG PO TB24
1000.0000 mg | ORAL_TABLET | Freq: Every day | ORAL | Status: DC
  Administered 2019-08-10 – 2019-08-12 (×3): 1000 mg via ORAL
  Filled 2019-08-10 (×3): qty 2

## 2019-08-10 MED ORDER — LINAGLIPTIN 5 MG PO TABS
5.0000 mg | ORAL_TABLET | Freq: Every day | ORAL | Status: DC
  Administered 2019-08-11 – 2019-08-13 (×3): 5 mg via ORAL
  Filled 2019-08-10 (×3): qty 1

## 2019-08-10 MED ORDER — GLIPIZIDE ER 5 MG PO TB24
5.0000 mg | ORAL_TABLET | Freq: Two times a day (BID) | ORAL | Status: DC
  Administered 2019-08-10 – 2019-08-13 (×6): 5 mg via ORAL
  Filled 2019-08-10 (×6): qty 1

## 2019-08-10 MED ORDER — TRAZODONE HCL 100 MG PO TABS
100.0000 mg | ORAL_TABLET | Freq: Every day | ORAL | Status: DC
  Administered 2019-08-10 – 2019-08-12 (×3): 100 mg via ORAL
  Filled 2019-08-10 (×3): qty 1

## 2019-08-10 MED ORDER — LURASIDONE HCL 40 MG PO TABS
20.0000 mg | ORAL_TABLET | Freq: Every day | ORAL | Status: DC
  Administered 2019-08-11 – 2019-08-12 (×2): 20 mg via ORAL
  Filled 2019-08-10 (×2): qty 1

## 2019-08-10 MED ORDER — MAGNESIUM HYDROXIDE 400 MG/5ML PO SUSP: 30.0000 mL | Freq: Every day | ORAL | Status: DC | PRN

## 2019-08-10 MED ORDER — ACETAMINOPHEN 325 MG PO TABS: 650.0000 mg | ORAL_TABLET | Freq: Four times a day (QID) | ORAL | Status: DC | PRN

## 2019-08-10 MED ORDER — ROSUVASTATIN CALCIUM 20 MG PO TABS
40.0000 mg | ORAL_TABLET | Freq: Every day | ORAL | Status: DC
  Administered 2019-08-11 – 2019-08-12 (×2): 40 mg via ORAL
  Filled 2019-08-10 (×3): qty 2

## 2019-08-10 MED ORDER — METFORMIN HCL 500 MG PO TABS
500.0000 mg | ORAL_TABLET | Freq: Two times a day (BID) | ORAL | Status: DC
  Administered 2019-08-11 – 2019-08-13 (×5): 500 mg via ORAL
  Filled 2019-08-10 (×5): qty 1

## 2019-08-10 MED ORDER — FLUOXETINE HCL 20 MG PO CAPS
40.0000 mg | ORAL_CAPSULE | Freq: Every day | ORAL | Status: DC
  Administered 2019-08-11 – 2019-08-13 (×3): 40 mg via ORAL
  Filled 2019-08-10 (×3): qty 2

## 2019-08-10 MED ORDER — LORATADINE 10 MG PO TABS
10.0000 mg | ORAL_TABLET | Freq: Every day | ORAL | Status: DC
  Administered 2019-08-11 – 2019-08-13 (×3): 10 mg via ORAL
  Filled 2019-08-10 (×3): qty 1

## 2019-08-10 MED ORDER — CLONAZEPAM 1 MG PO TABS
1.0000 mg | ORAL_TABLET | Freq: Every day | ORAL | Status: DC
  Administered 2019-08-10 – 2019-08-12 (×3): 1 mg via ORAL
  Filled 2019-08-10 (×3): qty 1

## 2019-08-10 MED ORDER — ALUM & MAG HYDROXIDE-SIMETH 200-200-20 MG/5ML PO SUSP: 30.0000 mL | ORAL | Status: DC | PRN

## 2019-08-10 MED ORDER — ENALAPRIL MALEATE 5 MG PO TABS
5.0000 mg | ORAL_TABLET | Freq: Two times a day (BID) | ORAL | Status: DC
  Administered 2019-08-10 – 2019-08-13 (×5): 5 mg via ORAL
  Filled 2019-08-10 (×7): qty 1

## 2019-08-10 NOTE — ED Notes (Signed)
EDP and Probation officer at bedside.

## 2019-08-10 NOTE — ED Notes (Signed)
Pt ambulated to bathroom 

## 2019-08-10 NOTE — ED Provider Notes (Signed)
[facility] Emergency Department Provider Note  ____________________________________________   First MD Initiated Contact with Patient 08/10/19 [phone]     (approximate)  I have reviewed the triage vital signs and the nursing notes.   HISTORY  Chief Complaint Mental Health Problem   HPI [name] is a 74 y.o. female with below list of previous medical and psychiatric conditions presents to the emergency department secondary to suicidal ideation.  Patient states that "I have no reason to live I just do not want to live anymore".  Patient goes on to state that she is estranged from her children and family.  Patient denies any specific plan.       Past Medical History:  Diagnosis Date  . Arthritis   . Breast cancer ([place])   . Cancer [facility]) 2004   right breast ca  . Diabetes mellitus without complication ([place])   . Hypercholesterolemia   . Hypertension   . Hypothyroid   . Seasonal allergies     Patient Active Problem List   Diagnosis Date Noted  . MDD (major depressive disorder), recurrent episode, severe ([place]) 06/24/2019  . Pain due to onychomycosis of toenails of both feet 11/23/2018  . Bipolar 1 disorder, manic, moderate ([place]) 07/21/2018  . Bipolar 1 disorder, mixed, severe ([place]) 07/21/2018  . Bipolar 1 disorder with moderate mania ([place]) 07/20/2018  . Bipolar I disorder, most recent episode depressed with anxious distress ([place]) 07/07/2017  . PTSD (post-traumatic stress disorder) 07/07/2017  . 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 Diabetes ([place]) 09/23/2014  . Essential hypertension 09/21/2014  . Hypothyroidism 09/21/2014  . Dyslipidemia 09/21/2014  . Bipolar I disorder, current or most recent episode manic, severe with mixed features ([place]) 09/20/2014    Past Surgical History:  Procedure Laterality Date  . ABDOMINAL HYSTERECTOMY    . BREAST BIOPSY Left    neg  . BREAST EXCISIONAL BIOPSY  Right 2004   breast ca and lumpectomy  . BREAST LUMPECTOMY Right 2004   breast ca  . CHOLECYSTECTOMY    . COLONOSCOPY WITH PROPOFOL N/A 02/13/2017   Procedure: COLONOSCOPY WITH PROPOFOL;  Surgeon: Jonathon Bellows, MD;  Location: [facility] ENDOSCOPY;  Service: Gastroenterology;  Laterality: N/A;  . KIDNEY STONE SURGERY      Prior to Admission medications   Medication Sig Start Date End Date Taking? Authorizing Provider  clonazePAM (KLONOPIN) 1 MG tablet Take 1 tablet (1 mg total) by mouth at bedtime. 07/27/18   Clapacs, Madie Reno, MD  divalproex (DEPAKOTE ER) 500 MG 24 hr tablet Take 2 tablets (1,000 mg total) by mouth at bedtime. 07/27/18   Clapacs, Madie Reno, MD  enalapril (VASOTEC) 5 MG tablet Take 1 tablet (5 mg total) by mouth 2 (two) times daily. 06/29/19   Starkes-Perry, Gayland Curry, FNP  FLUoxetine (PROZAC) 40 MG capsule Take 1 capsule (40 mg total) by mouth daily. 07/28/18   Clapacs, Madie Reno, MD  glipiZIDE (GLUCOTROL XL) 5 MG 24 hr tablet Take 2 tablets by mouth 2 (two) times daily. 06/21/19   [provider]  levothyroxine (SYNTHROID) 88 MCG tablet Take 88 mcg by mouth daily before breakfast.  11/09/18   [provider]  linagliptin (TRADJENTA) 5 MG TABS tablet Take 1 tablet (5 mg total) by mouth daily. 06/30/19   Starkes-Perry, Gayland Curry, FNP  loratadine (CLARITIN) 10 MG tablet Take 1 tablet (10 mg total) by mouth daily. 07/27/18   Clapacs, Madie Reno, MD  lurasidone (LATUDA) 20  MG TABS tablet Take 1 tablet (20 mg total) by mouth daily with supper. 06/29/19   Starkes-Perry, Gayland Curry, FNP  PAZEO 0.7 % SOLN Take 1 drop by mouth daily.  08/25/18   [provider]  rosuvastatin (CRESTOR) 40 MG tablet Take 40 mg by mouth daily. 05/12/19   [provider]  traZODone (DESYREL) 100 MG tablet Take 1 tablet (100 mg total) by mouth at bedtime. 06/29/19   Suella Broad, FNP    Allergies Navane [thiothixene] and Penicillins  Family History  Problem Relation Age of Onset  . Bladder Cancer  Neg Hx   . Kidney cancer Neg Hx     Social History Social History   Tobacco Use  . Smoking status: Former Smoker    Types: Cigarettes  . Smokeless tobacco: Never Used  Substance Use Topics  . Alcohol use: No  . Drug use: No    Review of Systems Constitutional: No fever/chills Eyes: No visual changes. ENT: No sore throat. Cardiovascular: Denies chest pain. Respiratory: Denies shortness of breath. Gastrointestinal: No abdominal pain.  No nausea, no vomiting.  No diarrhea.  No constipation. Genitourinary: Negative for dysuria. Musculoskeletal: Negative for neck pain.  Negative for back pain. Integumentary: Negative for rash. Neurological: Negative for headaches, focal weakness or numbness. Psychiatric: Positive for suicidal ideation   ____________________________________________   PHYSICAL EXAM:  VITAL SIGNS: ED Triage Vitals [08/10/19 0315]  Enc Vitals Group     BP (!) 154/76     Pulse Rate 63     Resp 20     Temp 97.9 F (36.6 C)     Temp Source Oral     SpO2 95 %     Weight 79.4 kg (175 lb)     Height 1.549 m (5\' 1" )     Head Circumference      Peak Flow      Pain Score 0     Pain Loc      Pain Edu?      Excl. in [place]?      Constitutional: Alert and oriented.  Tearful Eyes: Conjunctivae are normal.  Mouth/Throat: Patient is wearing a mask. Neck: No stridor.  No meningeal signs.   Cardiovascular: Normal rate, regular rhythm. Good peripheral circulation. Grossly normal heart sounds. Respiratory: Normal respiratory effort.  No retractions. Gastrointestinal: Soft and nontender. No distention.  Musculoskeletal: No lower extremity tenderness nor edema. No gross deformities of extremities. Neurologic:  Normal speech and language. No gross focal neurologic deficits are appreciated.  Skin:  Skin is warm, dry and intact. Psychiatric: Depressed affect. Speech and behavior are normal.  ____________________________________________   LABS (all labs ordered are  listed, but only abnormal results are displayed)  Labs Reviewed  COMPREHENSIVE METABOLIC PANEL - Abnormal; Notable for the following components:      Result Value   Glucose, Bld 168 (*)    BUN 32 (*)    Creatinine, Ser 1.10 (*)    GFR calc non Af Amer 49 (*)    GFR calc Af Amer 57 (*)    All other components within normal limits  ACETAMINOPHEN LEVEL - Abnormal; Notable for the following components:   Acetaminophen (Tylenol), Serum <10 (*)    All other components within normal limits  SALICYLATE LEVEL - Abnormal; Notable for the following components:   Salicylate Lvl [id number] (*)    All other components within normal limits  URINALYSIS, COMPLETE (UACMP) WITH MICROSCOPIC - Abnormal; Notable for the following components:   Color, Urine  STRAW (*)    APPearance CLEAR (*)    Glucose, UA >=500 (*)    Ketones, ur 5 (*)    All other components within normal limits  CBC WITH DIFFERENTIAL/PLATELET  ETHANOL  URINE DRUG SCREEN, QUALITATIVE ([facility] ONLY)        Procedures   ____________________________________________   INITIAL IMPRESSION / MDM / ASSESSMENT AND PLAN / ED COURSE  As part of my medical decision making, I reviewed the following data within the electronic MEDICAL RECORD NUMBER   74 year old female presented with above-stated history and physical exam secondary to suicidal ideation.  Awaiting psychiatry evaluation and disposition  ____________________________________________  FINAL CLINICAL IMPRESSION(S) / ED DIAGNOSES  Final diagnoses:  Suicidal ideation     MEDICATIONS GIVEN DURING THIS VISIT:  Medications - No data to display   ED Discharge Orders    None      *Please note:  Deandrea Alayah Kittel was evaluated in Emergency Department on 08/10/2019 for the symptoms described in the history of present illness. She was evaluated in the context of the global COVID-19 pandemic, which necessitated consideration that the patient might be at risk for infection with the  SARS-CoV-2 virus that causes COVID-19. Institutional protocols and algorithms that pertain to the evaluation of patients at risk for COVID-19 are in a state of rapid change based on information released by regulatory bodies including the CDC and federal and state organizations. These policies and algorithms were followed during the patient's care in the ED.  Some ED evaluations and interventions may be delayed as a result of limited staffing during the pandemic.*  Note:  This document was prepared using Dragon voice recognition software and may include unintentional dictation errors.   Gregor Hams, MD 08/10/19 [phone]

## 2019-08-10 NOTE — ED Notes (Signed)
Pt. Alert and oriented, warm and dry, in no distress. Pt. Denies HI, and AVH. Pt states she has all kinds of plans. Patient contracts for safety with this Probation officer. Patient states she just has no reason to live anymore and just don't want to be here. Pt states she is estanged from her children and family. Pt. Encouraged to let nursing staff know of any concerns or needs.

## 2019-08-10 NOTE — Consult Note (Signed)
[facility] Face-to-Face Psychiatry Consult   Reason for Consult: Depression with SI Referring Physician: Dr. Corky Downs Patient Identification: [name] MRN:  [id number] Principal Diagnosis: <principal problem not specified> Diagnosis:  Active Problems:   * No active hospital problems. *   Total Time spent with patient: 45 minutes  Subjective:   [name] is a 74 y.o. female patient who presented to the emergency department with depression and SI  HPI: Patient is a 74 year old female with a past psychiatric history of MDD who presents to emergency department complaining of depression and SI.  Patient states that her chronic depression has been getting worse recently and she is been having difficulty functioning in her house.  Patient states that she feels very isolated and worthless and oftentimes thinks about suicide.  Patient states that she is compliant with her psychiatric medications as well as her outpatient appointments but denies that the regimen is able to control her symptoms of depression.  She states that she has been feeling this way for quite some time but it has been worse over the last 7 years and especially so for the last month or so.  Patient states pandemic as well as estrangement from her family leaves her feeling very isolated.  She states that she is not able to make friends with the neighbors and feels trapped in her apartment.  Patient states that she feels worthless when looking back in her life and realizing that she abandoned her children as well as did not do anything meaningful with herself.  She is tearful and crying throughout the interview.  She states that this leads her to believe that there is no longer point in her living.  Patient states that she has been having difficulty with her mood for quite some time. She is agreeable to admission at this time to adjust her regimen as well as stabilize her mood.  Past Psychiatric History: Patient has a  history of major depressive disorder.  She does have prior inpatient hospitalizations for psychiatric purposes.  She is currently compliant with her medications.  Risk to Self:  Yes Risk to Others:  No Prior Inpatient Therapy:  Yes Prior Outpatient Therapy:  Yes  Past Medical History:  Past Medical History:  Diagnosis Date  . Arthritis   . Breast cancer ([place])   . Cancer [facility]) 2004   right breast ca  . Diabetes mellitus without complication ([place])   . Hypercholesterolemia   . Hypertension   . Hypothyroid   . Seasonal allergies     Past Surgical History:  Procedure Laterality Date  . ABDOMINAL HYSTERECTOMY    . BREAST BIOPSY Left    neg  . BREAST EXCISIONAL BIOPSY Right 2004   breast ca and lumpectomy  . BREAST LUMPECTOMY Right 2004   breast ca  . CHOLECYSTECTOMY    . COLONOSCOPY WITH PROPOFOL N/A 02/13/2017   Procedure: COLONOSCOPY WITH PROPOFOL;  Surgeon: Jonathon Bellows, MD;  Location: [facility] ENDOSCOPY;  Service: Gastroenterology;  Laterality: N/A;  . KIDNEY STONE SURGERY     Family History:  Family History  Problem Relation Age of Onset  . Bladder Cancer Neg Hx   . Kidney cancer Neg Hx    Family Psychiatric  History: Denies Social History:  Social History   Substance and Sexual Activity  Alcohol Use No     Social History   Substance and Sexual Activity  Drug Use No    Social History   Socioeconomic History  . Marital status: Divorced  Spouse name: Not on file  . Number of children: Not on file  . Years of education: Not on file  . Highest education level: Not on file  Occupational History  . Not on file  Tobacco Use  . Smoking status: Former Smoker    Types: Cigarettes  . Smokeless tobacco: Never Used  Substance and Sexual Activity  . Alcohol use: No  . Drug use: No  . Sexual activity: Not Currently  Other Topics Concern  . Not on file  Social History Narrative  . Not on file   Social Determinants of Health   Financial Resource Strain:   .  Difficulty of Paying Living Expenses:   Food Insecurity:   . Worried About Charity fundraiser in the Last Year:   . Arboriculturist in the Last Year:   Transportation Needs:   . Film/video editor (Medical):   Marland Kitchen Lack of Transportation (Non-Medical):   Physical Activity:   . Days of Exercise per Week:   . Minutes of Exercise per Session:   Stress:   . Feeling of Stress :   Social Connections:   . Frequency of Communication with Friends and Family:   . Frequency of Social Gatherings with Friends and Family:   . Attends Religious Services:   . Active Member of Clubs or Organizations:   . Attends Archivist Meetings:   Marland Kitchen Marital Status:    Additional Social History:    Allergies:   Allergies  Allergen Reactions  . Navane [Thiothixene] Other (See Comments)    Reaction:  Unknown  . Penicillins Rash    Has patient had a PCN reaction causing immediate rash, facial/tongue/throat swelling, SOB or lightheadedness with hypotension: No Has patient had a PCN reaction causing severe rash involving mucus membranes or skin necrosis: No Has patient had a PCN reaction that required hospitalization: No Has patient had a PCN reaction occurring within the last 10 years: No If all of the above answers are "NO", then may proceed with Cephalosporin use.     Labs:  Results for orders placed or performed during the hospital encounter of 08/10/19 (from the past 48 hour(s))  CBC with Differential     Status: None   Collection Time: 08/10/19  3:37 AM  Result Value Ref Range   WBC 9.3 4.0 - 10.5 K/uL   RBC 4.97 3.87 - 5.11 MIL/uL   Hemoglobin 14.5 12.0 - 15.0 g/dL   HCT 44.2 36.0 - 46.0 %   MCV 88.9 80.0 - 100.0 fL   MCH 29.2 26.0 - 34.0 pg   MCHC 32.8 30.0 - 36.0 g/dL   RDW 12.8 11.5 - 15.5 %   Platelets 218 150 - 400 K/uL   nRBC 0.0 0.0 - 0.2 %   Neutrophils Relative % 45 %   Neutro Abs 4.2 1.7 - 7.7 K/uL   Lymphocytes Relative 41 %   Lymphs Abs 3.8 0.7 - 4.0 K/uL    Monocytes Relative 9 %   Monocytes Absolute 0.8 0.1 - 1.0 K/uL   Eosinophils Relative 3 %   Eosinophils Absolute 0.3 0.0 - 0.5 K/uL   Basophils Relative 1 %   Basophils Absolute 0.1 0.0 - 0.1 K/uL   Immature Granulocytes 1 %   Abs Immature Granulocytes 0.07 0.00 - 0.07 K/uL    Comment: Performed at [facility], [address], [place], [place] [address]  Comprehensive metabolic panel     Status: Abnormal   Collection Time:  08/10/19  3:37 AM  Result Value Ref Range   Sodium 137 135 - 145 mmol/L   Potassium 3.7 3.5 - 5.1 mmol/L   Chloride 101 98 - 111 mmol/L   CO2 25 22 - 32 mmol/L   Glucose, Bld 168 (H) 70 - 99 mg/dL    Comment: Glucose reference range applies only to samples taken after fasting for at least 8 hours.   BUN 32 (H) 8 - 23 mg/dL   Creatinine, Ser 1.10 (H) 0.44 - 1.00 mg/dL   Calcium 9.5 8.9 - 10.3 mg/dL   Total Protein 7.6 6.5 - 8.1 g/dL   Albumin 4.2 3.5 - 5.0 g/dL   AST 22 15 - 41 U/L   ALT 28 0 - 44 U/L   Alkaline Phosphatase 51 38 - 126 U/L   Total Bilirubin 0.5 0.3 - 1.2 mg/dL   GFR calc non Af Amer 49 (L) >60 mL/min   GFR calc Af Amer 57 (L) >60 mL/min   Anion gap 11 5 - 15    Comment: Performed at [facility], [address] [place], [place] [address]  Ethanol     Status: None   Collection Time: 08/10/19  3:37 AM  Result Value Ref Range   Alcohol, Ethyl (B) <10 <10 mg/dL    Comment: (NOTE) Lowest detectable limit for serum alcohol is 10 mg/dL. For medical purposes only. Performed at [facility], [address] [place], [place] [address]   Acetaminophen level     Status: Abnormal   Collection Time: 08/10/19  3:37 AM  Result Value Ref Range   Acetaminophen (Tylenol), Serum <10 (L) 10 - 30 ug/mL    Comment: (NOTE) Therapeutic concentrations vary significantly. A range of 10-30 ug/mL  may be an effective concentration for many patients. However, some  are best treated at concentrations outside of this  range. Acetaminophen concentrations >150 ug/mL at 4 hours after ingestion  and >50 ug/mL at 12 hours after ingestion are often associated with  toxic reactions. Performed at [facility], [address] [place], [place] [id number]   Salicylate level     Status: Abnormal   Collection Time: 08/10/19  3:37 AM  Result Value Ref Range   Salicylate Lvl [id number] (L) 7.0 - 30.0 mg/dL    Comment: Performed at [facility], [address] [place], [place] [address]  Urine Drug Screen, Qualitative ([facility] only)     Status: None   Collection Time: 08/10/19  3:37 AM  Result Value Ref Range   Tricyclic, Ur Screen NONE DETECTED NONE DETECTED   Amphetamines, Ur Screen NONE DETECTED NONE DETECTED   MDMA (Ecstasy)Ur Screen NONE DETECTED NONE DETECTED   Cocaine Metabolite,Ur [place] NONE DETECTED NONE DETECTED   Opiate, Ur Screen NONE DETECTED NONE DETECTED   Phencyclidine (PCP) Ur S NONE DETECTED NONE DETECTED   Cannabinoid 50 Ng, Ur  NONE DETECTED NONE DETECTED   Barbiturates, Ur Screen NONE DETECTED NONE DETECTED   Benzodiazepine, Ur Scrn NONE DETECTED NONE DETECTED   Methadone Scn, Ur NONE DETECTED NONE DETECTED    Comment: (NOTE) Tricyclics + metabolites, urine    Cutoff 1000 ng/mL Amphetamines + metabolites, urine  Cutoff 1000 ng/mL MDMA (Ecstasy), urine              Cutoff 500 ng/mL Cocaine Metabolite, urine          Cutoff 300 ng/mL Opiate + metabolites, urine        Cutoff 300 ng/mL Phencyclidine (PCP),  urine         Cutoff 25 ng/mL Cannabinoid, urine                 Cutoff 50 ng/mL Barbiturates + metabolites, urine  Cutoff 200 ng/mL Benzodiazepine, urine              Cutoff 200 ng/mL Methadone, urine                   Cutoff 300 ng/mL The urine drug screen provides only a preliminary, unconfirmed analytical test result and should not be used for non-medical purposes. Clinical consideration and professional judgment should be applied to any positive drug screen result due to  possible interfering substances. A more specific alternate chemical method must be used in order to obtain a confirmed analytical result. Gas chromatography / mass spectrometry (GC/MS) is the preferred confirmat ory method. Performed at [facility], [address] [place], [place] [address]   Urinalysis, Complete w Microscopic     Status: Abnormal   Collection Time: 08/10/19  3:37 AM  Result Value Ref Range   Color, Urine STRAW (A) YELLOW   APPearance CLEAR (A) CLEAR   Specific Gravity, Urine 1.025 1.005 - 1.030   pH 6.0 5.0 - 8.0   Glucose, UA >=500 (A) NEGATIVE mg/dL   Hgb urine dipstick NEGATIVE NEGATIVE   Bilirubin Urine NEGATIVE NEGATIVE   Ketones, ur 5 (A) NEGATIVE mg/dL   Protein, ur NEGATIVE NEGATIVE mg/dL   Nitrite NEGATIVE NEGATIVE   Leukocytes,Ua NEGATIVE NEGATIVE   RBC / HPF 0-5 0 - 5 RBC/hpf   WBC, UA 0-5 0 - 5 WBC/hpf   Bacteria, UA NONE SEEN NONE SEEN   Squamous Epithelial / LPF 0-5 0 - 5    Comment: Performed at [facility], [address] [place], [place] [address]  Respiratory Panel by RT PCR (Flu A&B, Covid) - Nasopharyngeal Swab     Status: None   Collection Time: 08/10/19 12:44 PM   Specimen: Nasopharyngeal Swab  Result Value Ref Range   SARS Coronavirus 2 by RT PCR NEGATIVE NEGATIVE    Comment: (NOTE) SARS-CoV-2 target nucleic acids are NOT DETECTED. The SARS-CoV-2 RNA is generally detectable in upper respiratoy specimens during the acute phase of infection. The lowest concentration of SARS-CoV-2 viral copies this assay can detect is 131 copies/mL. A negative result does not preclude SARS-Cov-2 infection and should not be used as the sole basis for treatment or other patient management decisions. A negative result may occur with  improper specimen collection/handling, submission of specimen other than nasopharyngeal swab, presence of viral mutation(s) within the areas targeted by this assay, and inadequate number of viral  copies (<131 copies/mL). A negative result must be combined with clinical observations, patient history, and epidemiological information. The expected result is Negative. Fact Sheet for Patients:  PinkCheek.be Fact Sheet for Healthcare Providers:  GravelBags.it This test is not yet ap proved or cleared by the [place] FDA and  has been authorized for detection and/or diagnosis of SARS-CoV-2 by FDA under an Emergency Use Authorization (EUA). This EUA will remain  in effect (meaning this test can be used) for the duration of the COVID-19 declaration under Section 564(b)(1) of the Act, 21 U.S.C. section 360bbb-3(b)(1), unless the authorization is terminated or revoked sooner.    Influenza A by PCR NEGATIVE NEGATIVE   Influenza B by PCR NEGATIVE NEGATIVE    Comment: (NOTE) The Xpert Xpress SARS-CoV-2/FLU/RSV assay is intended as an aid in  the diagnosis of influenza from Nasopharyngeal swab specimens and  should not be used as a sole basis for treatment. Nasal washings and  aspirates are unacceptable for Xpert Xpress SARS-CoV-2/FLU/RSV  testing. Fact Sheet for Patients: PinkCheek.be Fact Sheet for Healthcare Providers: GravelBags.it This test is not yet approved or cleared by the [place] FDA and  has been authorized for detection and/or diagnosis of SARS-CoV-2 by  FDA under an Emergency Use Authorization (EUA). This EUA will remain  in effect (meaning this test can be used) for the duration of the  Covid-19 declaration under Section 564(b)(1) of the Act, 21  U.S.C. section 360bbb-3(b)(1), unless the authorization is  terminated or revoked. Performed at [facility], [address] [place], [place] [address]     No current facility-administered medications for this encounter.   Current Outpatient Medications  Medication Sig Dispense Refill  .  clonazePAM (KLONOPIN) 1 MG tablet Take 1 tablet (1 mg total) by mouth at bedtime. 30 tablet 1  . divalproex (DEPAKOTE ER) 500 MG 24 hr tablet Take 2 tablets (1,000 mg total) by mouth at bedtime. 60 tablet 1  . enalapril (VASOTEC) 5 MG tablet Take 1 tablet (5 mg total) by mouth 2 (two) times daily. 60 tablet 0  . FARXIGA 5 MG TABS tablet Take 5 mg by mouth daily.    Marland Kitchen FLUoxetine (PROZAC) 40 MG capsule Take 1 capsule (40 mg total) by mouth daily. 30 capsule 1  . glipiZIDE (GLUCOTROL XL) 5 MG 24 hr tablet Take 2 tablets by mouth 2 (two) times daily.    Marland Kitchen levothyroxine (SYNTHROID) 88 MCG tablet Take 88 mcg by mouth daily before breakfast.     . linagliptin (TRADJENTA) 5 MG TABS tablet Take 1 tablet (5 mg total) by mouth daily. 30 tablet 0  . loratadine (CLARITIN) 10 MG tablet Take 1 tablet (10 mg total) by mouth daily. 30 tablet 1  . lurasidone (LATUDA) 20 MG TABS tablet Take 1 tablet (20 mg total) by mouth daily with supper. 30 tablet 0  . metFORMIN (GLUCOPHAGE) 500 MG tablet Take 500 mg by mouth 2 (two) times daily.    Marland Kitchen PAZEO 0.7 % SOLN Take 1 drop by mouth daily.     . rosuvastatin (CRESTOR) 40 MG tablet Take 40 mg by mouth daily.    . traZODone (DESYREL) 100 MG tablet Take 1 tablet (100 mg total) by mouth at bedtime. 30 tablet 0    Musculoskeletal: Strength & Muscle Tone: within normal limits Gait & Station: normal Patient leans: N/A  Psychiatric Specialty Exam: Physical Exam  Constitutional: She is oriented to person, place, and time. She appears well-developed and well-nourished.  Musculoskeletal:        General: Normal range of motion.     Cervical back: Normal range of motion.  Neurological: She is alert and oriented to person, place, and time.    Review of Systems  Blood pressure (!) 110/55, pulse 62, temperature 98.9 F (37.2 C), temperature source Oral, resp. rate 17, height 5\' 1"  (1.549 m), weight 79.4 kg, SpO2 94 %.Body mass index is 33.07 kg/m.  General Appearance: Fairly  Groomed  Eye Contact:  Fair  Speech:  Clear and Coherent  Volume:  Increased  Mood:  Anxious and Depressed  Affect:  Labile and Tearful  Thought Process:  Coherent  Orientation:  Full (Time, Place, and Person)  Thought Content:  Logical and Rumination  Suicidal Thoughts:  Yes.  without intent/plan  Homicidal Thoughts:  No  Memory:  Recent;   Fair  Judgement:  Impaired  Insight:  Present  Psychomotor Activity:  Normal  Concentration:  Concentration: Fair  Recall:  [employer] of Knowledge:  Fair  Language:  Fair  Akathisia:  No  Handed:  Right  AIMS (if indicated):     Assets:  Communication Skills Desire for Improvement Housing Leisure Time Resilience  ADL's:  Intact  Cognition:  WNL  Sleep:        Treatment Plan Summary:  Patient is a 74 year old female with a history of a major depressive disorder who presents with a worsening of this in the context of social isolation.  Patient will benefit from inpatient hospitalization for safety, stabilization, medication management.  Diagnosis: MDD  Disposition: Recommend psychiatric Inpatient admission when medically cleared.  Dixie Dials, MD 08/10/2019 2:06 PM

## 2019-08-10 NOTE — ED Notes (Signed)
Pt has ambulated to the restroom multiple times this morning   Pt received breakfast tray and consumed 85% of meal    Pt is now resting again in the bed  lw edt

## 2019-08-10 NOTE — ED Notes (Signed)
Pt wet her scrubs and was given new scrubs to change into.

## 2019-08-10 NOTE — ED Notes (Signed)
Food tray with juice was given.

## 2019-08-10 NOTE — ED Notes (Signed)
Pt. Alert and oriented, warm and dry, in no distress. Pt. Denies SI, HI, and AVH. Pt states she is feeling much better and not so angry. Pt. Encouraged to let nursing staff know of any concerns or needs.

## 2019-08-10 NOTE — ED Notes (Signed)

## 2019-08-10 NOTE — ED Notes (Signed)
VOL  PENDING  PLACEMENT 

## 2019-08-10 NOTE — ED Notes (Addendum)
Pt removes blue pants, sleeveless blue print shirt, black slippers, beige bra, grey panties, black socks, black purse and 2 tote bags (1 with meds and 1 with toiletry items) --all placed in labeled pt belonging bag to be secured on nursing unit & pt changed into burgandy shirt and blue paper pants; pt to keep own glasses

## 2019-08-10 NOTE — ED Notes (Signed)
Patient is to be admitted to [facility] BMU by Dr. Ainsley Spinner.  Attending Physician will be Dr. Weber Cooks.   Patient has been assigned to room 301, by [place] Nurse .   Intake Paper Work has been signed and placed on patient chart.  ER staff is aware of the admission:  ER Secretary    Dr.  ER MD   Maudie Mercury Patient's Nurse   Patient Access.

## 2019-08-10 NOTE — ED Triage Notes (Signed)
Patient ambulatory to triage with steady gait, without difficulty or distress noted, mask in place; pt reports "I live in a cocoon and I don't want to live anymore"; pt denies specific plan

## 2019-08-11 DIAGNOSIS — F314 Bipolar disorder, current episode depressed, severe, without psychotic features: Principal | ICD-10-CM

## 2019-08-11 LAB — HEPATIC FUNCTION PANEL
ALT: 24 U/L (ref 0–44)
AST: 18 U/L (ref 15–41)
Albumin: 3.9 g/dL (ref 3.5–5.0)
Alkaline Phosphatase: 47 U/L (ref 38–126)
Bilirubin, Direct: <0.1 mg/dL (ref 0.0–0.2)
Total Bilirubin: 0.6 mg/dL (ref 0.3–1.2)
Total Protein: 7.3 g/dL (ref 6.5–8.1)

## 2019-08-11 LAB — TSH: TSH: 3.016 u[IU]/mL (ref 0.350–4.500)

## 2019-08-11 LAB — LIPID PANEL
Cholesterol: 142 mg/dL (ref 0–200)
HDL: 43 mg/dL (ref >40)
LDL Cholesterol: 52 mg/dL (ref 0–99)
Total CHOL/HDL Ratio: 3.3 RATIO
Triglycerides: 236 mg/dL — ABNORMAL HIGH (ref <150)
VLDL: 47 mg/dL — ABNORMAL HIGH (ref 0–40)

## 2019-08-11 LAB — GLUCOSE, CAPILLARY
Glucose-Capillary: 209 mg/dL — ABNORMAL HIGH (ref 70–99)
Glucose-Capillary: 198 mg/dL — ABNORMAL HIGH (ref 70–99)
Glucose-Capillary: 188 mg/dL — ABNORMAL HIGH (ref 70–99)
Glucose-Capillary: 143 mg/dL — ABNORMAL HIGH (ref 70–99)

## 2019-08-11 LAB — VALPROIC ACID LEVEL: Valproic Acid Lvl: 42 ug/mL — ABNORMAL LOW (ref 50.0–100.0)

## 2019-08-11 MED ORDER — FLUTICASONE PROPIONATE 50 MCG/ACT NA SUSP
2.0000 | Freq: Two times a day (BID) | NASAL | Status: DC
  Administered 2019-08-11 – 2019-08-13 (×4): 2 via NASAL
  Filled 2019-08-11: qty 16

## 2019-08-11 MED ORDER — OLOPATADINE HCL 0.1 % OP SOLN
1.0000 [drp] | Freq: Every day | OPHTHALMIC | Status: DC
  Administered 2019-08-11 – 2019-08-13 (×3): 1 [drp] via OPHTHALMIC
  Filled 2019-08-11: qty 5

## 2019-08-11 NOTE — BHH Counselor (Signed)
At patient's request CSW called Dr. Casilda Carls to cancel an appointment that she had scheduled for later today.  CSW left HIPAA compliant voicemail requesting a return call.   Assunta Curtis, MSW, LCSW 08/11/2019 9:53 AM

## 2019-08-11 NOTE — Plan of Care (Signed)
Pt alert and oriented x3. Pt out in the milieu reading a magazine. Affect pleasant. Pt states she is doing fine. Hs snacks given. Medication also given. Pt denies Hi/SI/AVH. Q15 minutes safety checks completed.  Problem: Consults Goal: Concurrent Medical Patient Education Description: (See Patient Education Module for education specifics) Outcome: Progressing   Problem: BHH Concurrent Medical Problem Goal: LTG-Pt will be physically stable and he/significant other Description: (Patient will be physically stable and he/significant other will be able to verbalize understanding of follow-up care and symptoms that would warrant further treatment) Outcome: Progressing Goal: STG-Vital signs will be within defined limits or stabilized Description: (STG- Vital signs will be within defined limits or stabilized for individual) Outcome: Progressing Goal: STG-Compliance with medication and/or treatment as ordered Description: (STG-Compliance with medication and/or treatment as ordered by MD) Outcome: Progressing Goal: STG-Verbalize two symptoms that would warrant further Description: (STG-Verbalize two symptoms that would warrant further treatment) Outcome: Progressing Goal: STG-Patient will participate in management/stabilization Description: (STG-Patient will participate in management/stabilization of medical condition) Outcome: Progressing Goal: STG-Other (Specify): Description: STG-Other Concurrent Medical (Specify): Outcome: Progressing   Problem: Education: Goal: Utilization of techniques to improve thought processes will improve Outcome: Progressing Goal: Knowledge of the prescribed therapeutic regimen will improve Outcome: Progressing   Problem: Activity: Goal: Interest or engagement in leisure activities will improve Outcome: Progressing Goal: Imbalance in normal sleep/wake cycle will improve Outcome: Progressing   Problem: Coping: Goal: Coping ability will improve Outcome:  Progressing Goal: Will verbalize feelings Outcome: Progressing   Problem: Health Behavior/Discharge Planning: Goal: Ability to make decisions will improve Outcome: Progressing Goal: Compliance with therapeutic regimen will improve Outcome: Progressing   Problem: Role Relationship: Goal: Will demonstrate positive changes in social behaviors and relationships Outcome: Progressing   Problem: Safety: Goal: Ability to disclose and discuss suicidal ideas will improve Outcome: Progressing Goal: Ability to identify and utilize support systems that promote safety will improve Outcome: Progressing   Problem: Self-Concept: Goal: Will verbalize positive feelings about self Outcome: Progressing Goal: Level of anxiety will decrease Outcome: Progressing   Problem: Education: Goal: Utilization of techniques to improve thought processes will improve Outcome: Progressing Goal: Knowledge of the prescribed therapeutic regimen will improve Outcome: Progressing   Problem: Activity: Goal: Interest or engagement in leisure activities will improve Outcome: Progressing Goal: Imbalance in normal sleep/wake cycle will improve Outcome: Progressing   Problem: Coping: Goal: Coping ability will improve Outcome: Progressing Goal: Will verbalize feelings Outcome: Progressing   Problem: Health Behavior/Discharge Planning: Goal: Ability to make decisions will improve Outcome: Progressing Goal: Compliance with therapeutic regimen will improve Outcome: Progressing   Problem: Role Relationship: Goal: Will demonstrate positive changes in social behaviors and relationships Outcome: Progressing   Problem: Safety: Goal: Ability to disclose and discuss suicidal ideas will improve Outcome: Progressing Goal: Ability to identify and utilize support systems that promote safety will improve Outcome: Progressing   Problem: Self-Concept: Goal: Will verbalize positive feelings about self Outcome:  Progressing Goal: Level of anxiety will decrease Outcome: Progressing   Problem: Education: Goal: Ability to make informed decisions regarding treatment will improve Outcome: Progressing   Problem: Coping: Goal: Coping ability will improve Outcome: Progressing   Problem: Health Behavior/Discharge Planning: Goal: Identification of resources available to assist in meeting health care needs will improve Outcome: Progressing   Problem: Medication: Goal: Compliance with prescribed medication regimen will improve Outcome: Progressing   Problem: Self-Concept: Goal: Ability to disclose and discuss suicidal ideas will improve Outcome: Progressing Goal: Will verbalize positive feelings about self Outcome:  Progressing

## 2019-08-11 NOTE — BHH Counselor (Signed)
CSW spoke with the CST Team Lead who reports that all Day Programs in [place] are closed currently due to Covid, however there are several the pt can participate in [place] and asked CSW to check in with patient to confirm if she is willing to go to [facility].   CSW asked about transportation and was informed that some of the Day Programs will provide transportation.  Assunta Curtis, MSW, LCSW 08/11/2019 3:33 PM

## 2019-08-11 NOTE — BHH Counselor (Signed)
CSW contacted th [facility] to find out additional information on the following services listed on their website that they provide:  Adult Care Home Case Pharmacist, hospital for Adults(CAP/DA) In-Home Aid Services Other [place]  Patient may benefit from assistance from the above.  CSW was unable to speak with anyone and left a voicemail message requesting a return call.  CSW aslo spoke with someone in [place] [employer] to determine if they provide transportation to and from day programs.  CSW was informed that if pt has regular Medicaid and day program is in county then transportation can be provided.  [facility] staff suggested [facility], [facility] or [facility], all of whom have or had day programs.  CSW was encouraged to check with the day programs to see if they will accept the patient's insurance as sometimes they do not accept certain ones.  Assunta Curtis, MSW, LCSW 08/11/2019 2:31 PM

## 2019-08-11 NOTE — BHH Suicide Risk Assessment (Signed)
[facility] Admission Suicide Risk Assessment   Nursing information obtained from:  Patient Demographic factors:  Age 74 or older, Caucasian, Living alone Current Mental Status:  Suicidal ideation indicated by patient Loss Factors:  Loss of significant relationship Historical Factors:  Impulsivity Risk Reduction Factors:  Positive therapeutic relationship, Positive coping skills or problem solving skills  Total Time spent with patient: 30 minutes Principal Problem: Severe bipolar I disorder, current or most recent episode depressed ([employer]) Diagnosis:  Principal Problem:   Severe bipolar I disorder, current or most recent episode depressed ([place])  Subjective Data: Patient seen chart reviewed.  Patient well-known from previous encounters.  74 year old woman with a history of bipolar disorder came to the hospital sad anxious feeling overwhelmed and somewhat hopeless.  Had not actually tried to hurt her self.  On interview today denies any current intent or plan to hurt herself but talks about how she feels like she cannot stand to be as lonely as she is in her current living situation.  Currently does not appear to be psychotic and is cooperating with treatment.  Continued Clinical Symptoms:  Alcohol Use Disorder Identification Test Final Score (AUDIT): 0 The "Alcohol Use Disorders Identification Test", Guidelines for Use in Primary Care, Second Edition.  World Pharmacologist [facility]). Score between 0-7:  no or low risk or alcohol related problems. Score between 8-15:  moderate risk of alcohol related problems. Score between 16-19:  high risk of alcohol related problems. Score 20 or above:  warrants further diagnostic evaluation for alcohol dependence and treatment.   CLINICAL FACTORS:   Bipolar Disorder:   Mixed State   Musculoskeletal: Strength & Muscle Tone: within normal limits Gait & Station: normal Patient leans: N/A  Psychiatric Specialty Exam: Physical Exam  Nursing note and vitals  reviewed. Constitutional: She appears well-developed and well-nourished.  HENT:  Head: Normocephalic and atraumatic.  Eyes: Pupils are equal, round, and reactive to light. Conjunctivae are normal.  Cardiovascular: Normal heart sounds.  Respiratory: Effort normal.  GI: Soft.  Musculoskeletal:        General: Normal range of motion.     Cervical back: Normal range of motion.  Neurological: She is alert.  Skin: Skin is warm and dry.  Psychiatric: Her speech is normal and behavior is normal. Judgment and thought content normal. Her mood appears anxious. Cognition and memory are normal.    Review of Systems  Constitutional: Negative.   HENT: Negative.   Eyes: Negative.   Respiratory: Negative.   Cardiovascular: Negative.   Gastrointestinal: Negative.   Musculoskeletal: Negative.   Skin: Negative.   Neurological: Negative.   Psychiatric/Behavioral: Positive for dysphoric mood. The patient is nervous/anxious.     Blood pressure 130/61, pulse 61, temperature 98 F (36.7 C), temperature source Oral, resp. rate 16, height 5\' 1"  (1.549 m), weight 78.9 kg, SpO2 93 %.Body mass index is 32.88 kg/m.  General Appearance: Casual  Eye Contact:  Good  Speech:  Normal Rate  Volume:  Increased  Mood:  Dysphoric  Affect:  Labile  Thought Process:  Coherent  Orientation:  Full (Time, Place, and Person)  Thought Content:  Logical and Tangential  Suicidal Thoughts:  No  Homicidal Thoughts:  No  Memory:  Immediate;   Fair Recent;   Fair Remote;   Fair  Judgement:  Impaired  Insight:  Shallow  Psychomotor Activity:  Restlessness  Concentration:  Concentration: Fair  Recall:  [employer] of Knowledge:  Fair  Language:  Fair  Akathisia:  No  Handed:  Right  AIMS (if indicated):     Assets:  Desire for Improvement Housing  ADL's:  Impaired  Cognition:  WNL  Sleep:         COGNITIVE FEATURES THAT CONTRIBUTE TO RISK:  None    SUICIDE RISK:   Minimal: No identifiable suicidal  ideation.  Patients presenting with no risk factors but with morbid ruminations; may be classified as minimal risk based on the severity of the depressive symptoms  PLAN OF CARE: Patient with history of bipolar disorder who does have a past history of suicidality presents to the emergency room with anxiety depression feeling overwhelmed and helpless.  Patient currently is not reporting any active suicidal intent.  She does have risk factors but is also cooperative with treatment.  Continue 15-minute checks and current treatment plan  I certify that inpatient services furnished can reasonably be expected to improve the patient's condition.   Alethia Berthold, MD 08/11/2019, 2:32 PM

## 2019-08-11 NOTE — Plan of Care (Signed)
Patient stated that she feels better since she got here.Patient states " I know mental illness is a life long thing.I have to  deal with it.But sometimes I feel so hopeless." Patient is pleasant and cooperative on approach.Appropriate with staff & peers.Compliant with medications.Attended groups.Appetite and energy level good.Support and encouragement given.

## 2019-08-11 NOTE — Tx Team (Signed)
Initial Treatment Plan 08/11/2019 3:41 AM [name] [id number]    PATIENT STRESSORS: Marital or family conflict Medication change or noncompliance   PATIENT STRENGTHS: Ability for insight Motivation for treatment/growth   PATIENT IDENTIFIED PROBLEMS: Suicidal ideation  depression                   DISCHARGE CRITERIA:  Motivation to continue treatment in a less acute level of care Verbal commitment to aftercare and medication compliance  PRELIMINARY DISCHARGE PLAN: Outpatient therapy Return to previous living arrangement  PATIENT/FAMILY INVOLVEMENT: This treatment plan has been presented to and reviewed with the patient, [name]. The patient has been given the opportunity to ask questions and make suggestions.  Arelie Kuzel P Alvine Mostafa, LPN [phone], [id number] AM

## 2019-08-11 NOTE — Progress Notes (Signed)
Recreation Therapy Notes  INPATIENT RECREATION THERAPY ASSESSMENT  Patient Details Name: [name] MRN: [id number] DOB: [birth year] Today's Date: 08/11/2019       Information Obtained From: Patient  Able to Participate in Assessment/Interview: Yes  Patient Presentation: Responsive  Reason for Admission (Per Patient): Active Symptoms, Suicidal Ideation, Other (Comments)(Depression)  Patient Stressors:    Coping Skills:   Music, Talk, Read  Leisure Interests (2+):  Individual - Reading, Games Theme park manager, Music - Listen  Frequency of Recreation/Participation: Weekly  Awareness of Community Resources:  Yes  Community Resources:  Park, Art therapist, Engineer, drilling, [employer]  Current Use: No  If no, Barriers?: Transportation, Museum/gallery curator  Expressed Interest in [place] of Residence:  Insurance underwriter  Patient Main Form of Transportation: Diplomatic Services operational officer  Patient Strengths:  making sacrifices  Patient Identified Areas of Improvement:  Some sort of communication with my children  Patient Goal for Hospitalization:  Be better enough to go home and have some resources  Current SI (including self-harm):  No  Current HI:  No  Current AVH: No  Staff Intervention Plan: Group Attendance, Collaborate with Interdisciplinary Treatment Team  Consent to Intern Participation: N/A  Nur Krasinski 08/11/2019, 11:04 AM

## 2019-08-11 NOTE — BHH Counselor (Signed)
At patient's request CSW called [facility] to determine if patient was still a client and could benefit from their services.   CSW spoke with Randall Hiss who reports that patient has been referred for CST.  He reports that someone attempted to speak to pt on 08/10/2019 and last contact was 08/06/2019.  He reported that he has sent a message for [place], [phone], to reach out to this CSW.  Assunta Curtis, MSW, LCSW 08/11/2019 10:24 AM

## 2019-08-11 NOTE — Progress Notes (Signed)
Pt in the day room looking at magazines. No other peer present in the room. Pt was pleasant and smiling when I approached her. Pt states she was doing well. Affect is pleasant. Pt encouraged to approach me(nurse) for help if needed.

## 2019-08-11 NOTE — H&P (Addendum)
Psychiatric Admission Assessment Adult  Patient Identification: [name] MRN:  [id number] Date of Evaluation:  08/11/2019 Chief Complaint:  MDD (major depressive disorder) [F32.9] Principal Diagnosis: Severe bipolar I disorder, current or most recent episode depressed ([place]) Diagnosis:  Principal Problem:   Severe bipolar I disorder, current or most recent episode depressed ([place])  History of Present Illness: Patient is a 74 year old female who lives alone and independently. Patient is well-known to this unit as she has been admitted here several times in the past and has a chronic history of mood instability. She presented to [facility] ED with reports that her depression has gotten worse she feels like she can't always take care of herself at home like she shouldn't. She states she has issues with getting in and out of her tub and that she likes to cook but can't stand up that long. She states she doesn't really have any support at home and lives alone in an apartment. She reports she would like to do more things but feels that she is unable to at times. She states that her only transportation is using a [employer]. She states she has a computer but does not know how to order groceries online or to use an uber. She reports today that she has felt a little bit better that she has been around people. She states that at the current moment she has not having any thoughts of wanting to die and denies any suicidal homicidal ideations and denies any hallucinations. Patient does report she feels some of the best things that could assist with her mood would be more resources for her to use at home. Patient does state that she had pill bottles at home that a been empty for approximately a week but she had no way of wanting to get them, so she has been without some of her medications for a few days.  Associated Signs/Symptoms: Depression Symptoms:  depressed mood, anhedonia, hypersomnia, fatigue, feelings  of worthlessness/guilt, hopelessness, suicidal thoughts without plan, anxiety, decreased appetite, (Hypo) Manic Symptoms:  Denies Anxiety Symptoms:  Denies Psychotic Symptoms:  Denies PTSD Symptoms: NA Total Time spent with patient: 1 hour  Past Psychiatric History: Patient has a lengthy history of mood instability and a diagnosis of bipolar depression she has had numerous visits to the ED as well as numerous hospitalizations for mental health issues.  Is the patient at risk to self? Yes.    Has the patient been a risk to self in the past 6 months? Yes.    Has the patient been a risk to self within the distant past? Yes.    Is the patient a risk to others? No.  Has the patient been a risk to others in the past 6 months? No.  Has the patient been a risk to others within the distant past? No.   Prior Inpatient Therapy:   Prior Outpatient Therapy:    Alcohol Screening: 1. How often do you have a drink containing alcohol?: Never 2. How many drinks containing alcohol do you have on a typical day when you are drinking?: 1 or 2 3. How often do you have six or more drinks on one occasion?: Never AUDIT-C Score: 0 4. How often during the last year have you found that you were not able to stop drinking once you had started?: Never 5. How often during the last year have you failed to do what was normally expected from you becasue of drinking?: Never 6. How often during the  last year have you needed a first drink in the morning to get yourself going after a heavy drinking session?: Never 7. How often during the last year have you had a feeling of guilt of remorse after drinking?: Never 8. How often during the last year have you been unable to remember what happened the night before because you had been drinking?: Never 9. Have you or someone else been injured as a result of your drinking?: No 10. Has a relative or friend or a doctor or another health worker been concerned about your drinking or  suggested you cut down?: No Alcohol Use Disorder Identification Test Final Score (AUDIT): 0 Substance Abuse History in the last 12 months:  No. Consequences of Substance Abuse: NA Previous Psychotropic Medications: Yes  Psychological Evaluations: Yes  Past Medical History:  Past Medical History:  Diagnosis Date  . Arthritis   . Breast cancer ([place])   . Cancer [facility]) 2004   right breast ca  . Diabetes mellitus without complication ([place])   . Hypercholesterolemia   . Hypertension   . Hypothyroid   . Seasonal allergies     Past Surgical History:  Procedure Laterality Date  . ABDOMINAL HYSTERECTOMY    . BREAST BIOPSY Left    neg  . BREAST EXCISIONAL BIOPSY Right 2004   breast ca and lumpectomy  . BREAST LUMPECTOMY Right 2004   breast ca  . CHOLECYSTECTOMY    . COLONOSCOPY WITH PROPOFOL N/A 02/13/2017   Procedure: COLONOSCOPY WITH PROPOFOL;  Surgeon: Jonathon Bellows, MD;  Location: [facility] ENDOSCOPY;  Service: Gastroenterology;  Laterality: N/A;  . KIDNEY STONE SURGERY     Family History:  Family History  Problem Relation Age of Onset  . Bladder Cancer Neg Hx   . Kidney cancer Neg Hx    Family Psychiatric  History: None reported Tobacco Screening: Have you used any form of tobacco in the last 30 days? (Cigarettes, Smokeless Tobacco, Cigars, and/or Pipes): No Social History:  Social History   Substance and Sexual Activity  Alcohol Use No     Social History   Substance and Sexual Activity  Drug Use No    Additional Social History:                           Allergies:   Allergies  Allergen Reactions  . Navane [Thiothixene] Other (See Comments)    Reaction:  Unknown  . Penicillins Rash    Has patient had a PCN reaction causing immediate rash, facial/tongue/throat swelling, SOB or lightheadedness with hypotension: No Has patient had a PCN reaction causing severe rash involving mucus membranes or skin necrosis: No Has patient had a PCN reaction that required  hospitalization: No Has patient had a PCN reaction occurring within the last 10 years: No If all of the above answers are "NO", then may proceed with Cephalosporin use.    Lab Results:  Results for orders placed or performed during the hospital encounter of 08/10/19 (from the past 48 hour(s))  Hepatic function panel     Status: None   Collection Time: 08/11/19  6:50 AM  Result Value Ref Range   Total Protein 7.3 6.5 - 8.1 g/dL   Albumin 3.9 3.5 - 5.0 g/dL   AST 18 15 - 41 U/L   ALT 24 0 - 44 U/L   Alkaline Phosphatase 47 38 - 126 U/L   Total Bilirubin 0.6 0.3 - 1.2 mg/dL   Bilirubin, Direct <0.1 0.0 -  0.2 mg/dL   Indirect Bilirubin NOT CALCULATED 0.3 - 0.9 mg/dL    Comment: Performed at [facility], [address] [place], [place] [address]  Lipid panel     Status: Abnormal   Collection Time: 08/11/19  6:50 AM  Result Value Ref Range   Cholesterol 142 0 - 200 mg/dL   Triglycerides 236 (H) <150 mg/dL   HDL 43 >40 mg/dL   Total CHOL/HDL Ratio 3.3 RATIO   VLDL 47 (H) 0 - 40 mg/dL   LDL Cholesterol 52 0 - 99 mg/dL    Comment:        Total Cholesterol/HDL:CHD Risk Coronary Heart Disease Risk Table                     Men   Women  1/2 Average Risk   3.4   3.3  Average Risk       5.0   4.4  2 X Average Risk   9.6   7.1  3 X Average Risk  23.4   11.0        Use the calculated Patient Ratio above and the CHD Risk Table to determine the patient's CHD Risk.        ATP III CLASSIFICATION (LDL):  <100     mg/dL   Optimal  100-129  mg/dL   Near or Above                    Optimal  130-159  mg/dL   Borderline  160-189  mg/dL   High  >190     mg/dL   Very High Performed at [facility], [address] [place], [place] [address]   TSH     Status: None   Collection Time: 08/11/19  6:50 AM  Result Value Ref Range   TSH 3.016 0.350 - 4.500 uIU/mL    Comment: Performed by a 3rd Generation assay with a functional sensitivity of <=0.01 uIU/mL. Performed at  [facility], [address] [place], [place] [address]   Valproic acid level     Status: Abnormal   Collection Time: 08/11/19  6:50 AM  Result Value Ref Range   Valproic Acid Lvl 42 (L) 50.0 - 100.0 ug/mL    Comment: Performed at [facility], [address] [place], [place] [address]  Glucose, capillary     Status: Abnormal   Collection Time: 08/11/19  6:55 AM  Result Value Ref Range   Glucose-Capillary 209 (H) 70 - 99 mg/dL    Comment: Glucose reference range applies only to samples taken after fasting for at least 8 hours.    Blood Alcohol level:  Lab Results  Component Value Date   ETH <10 08/10/2019   ETH <10 [id number]    Metabolic Disorder Labs:  Lab Results  Component Value Date   HGBA1C 9.6 (H) 07/21/2018   MPG 228.82 07/21/2018   MPG 240 07/07/2018   No results found for: PROLACTIN Lab Results  Component Value Date   CHOL 142 08/11/2019   TRIG 236 (H) 08/11/2019   HDL 43 08/11/2019   CHOLHDL 3.3 08/11/2019   VLDL 47 (H) 08/11/2019   LDLCALC 52 08/11/2019   LDLCALC 75 07/21/2018    Current Medications: Current Facility-Administered Medications  Medication Dose Route Frequency Provider Last Rate Last Admin  . acetaminophen (TYLENOL) tablet 650 mg  650 mg Oral Q6H PRN Cristofano, Dorene Ar, MD      . alum &  mag hydroxide-simeth (MAALOX/MYLANTA) 200-200-20 MG/5ML suspension 30 mL  30 mL Oral Q4H PRN Cristofano, Dorene Ar, MD      . clonazePAM (KLONOPIN) tablet 1 mg  1 mg Oral QHS Cristofano, Dorene Ar, MD   1 mg at 08/10/19 2241  . divalproex (DEPAKOTE ER) 24 hr tablet 1,000 mg  1,000 mg Oral QHS Cristofano, Paul A, MD   1,000 mg at 08/10/19 2241  . enalapril (VASOTEC) tablet 5 mg  5 mg Oral BID Cristofano, Dorene Ar, MD   5 mg at 08/11/19 0739  . FLUoxetine (PROZAC) capsule 40 mg  40 mg Oral Daily Cristofano, Dorene Ar, MD   40 mg at 08/11/19 0739  . glipiZIDE (GLUCOTROL XL) 24 hr tablet 5 mg  5 mg Oral BID Cristofano, Dorene Ar, MD   5 mg at 08/11/19 0739   . insulin aspart (novoLOG) injection 0-15 Units  0-15 Units Subcutaneous TID WC Cristofano, Dorene Ar, MD   5 Units at 08/11/19 0739  . levothyroxine (SYNTHROID) tablet 88 mcg  88 mcg Oral QAC breakfast Cristofano, Dorene Ar, MD   88 mcg at 08/11/19 0700  . linagliptin (TRADJENTA) tablet 5 mg  5 mg Oral Daily Cristofano, Dorene Ar, MD   5 mg at 08/11/19 0740  . loratadine (CLARITIN) tablet 10 mg  10 mg Oral Daily Cristofano, Dorene Ar, MD   10 mg at 08/11/19 0739  . lurasidone (LATUDA) tablet 20 mg  20 mg Oral Q supper Cristofano, Paul A, MD      . magnesium hydroxide (MILK OF MAGNESIA) suspension 30 mL  30 mL Oral Daily PRN Cristofano, Paul A, MD      . metFORMIN (GLUCOPHAGE) tablet 500 mg  500 mg Oral BID WC Cristofano, Dorene Ar, MD   500 mg at 08/11/19 0740  . rosuvastatin (CRESTOR) tablet 40 mg  40 mg Oral Daily Cristofano, Paul A, MD      . traZODone (DESYREL) tablet 100 mg  100 mg Oral QHS Cristofano, Dorene Ar, MD   100 mg at 08/10/19 2242   PTA Medications: Medications Prior to Admission  Medication Sig Dispense Refill Last Dose  . clonazePAM (KLONOPIN) 1 MG tablet Take 1 tablet (1 mg total) by mouth at bedtime. 30 tablet 1   . divalproex (DEPAKOTE ER) 500 MG 24 hr tablet Take 2 tablets (1,000 mg total) by mouth at bedtime. 60 tablet 1   . enalapril (VASOTEC) 5 MG tablet Take 1 tablet (5 mg total) by mouth 2 (two) times daily. 60 tablet 0   . FARXIGA 5 MG TABS tablet Take 5 mg by mouth daily.     Marland Kitchen FLUoxetine (PROZAC) 40 MG capsule Take 1 capsule (40 mg total) by mouth daily. 30 capsule 1   . glipiZIDE (GLUCOTROL XL) 5 MG 24 hr tablet Take 2 tablets by mouth 2 (two) times daily.     Marland Kitchen levothyroxine (SYNTHROID) 88 MCG tablet Take 88 mcg by mouth daily before breakfast.      . linagliptin (TRADJENTA) 5 MG TABS tablet Take 1 tablet (5 mg total) by mouth daily. 30 tablet 0   . loratadine (CLARITIN) 10 MG tablet Take 1 tablet (10 mg total) by mouth daily. 30 tablet 1   . lurasidone (LATUDA) 20 MG TABS  tablet Take 1 tablet (20 mg total) by mouth daily with supper. 30 tablet 0   . metFORMIN (GLUCOPHAGE) 500 MG tablet Take 500 mg by mouth 2 (two) times daily.     Marland Kitchen PAZEO 0.7 %  SOLN Take 1 drop by mouth daily.      . rosuvastatin (CRESTOR) 40 MG tablet Take 40 mg by mouth daily.     . traZODone (DESYREL) 100 MG tablet Take 1 tablet (100 mg total) by mouth at bedtime. 30 tablet 0     Musculoskeletal: Strength & Muscle Tone: within normal limits Gait & Station: Steady gait for short distances Patient leans: N/A  Psychiatric Specialty Exam: Physical Exam  Nursing note and vitals reviewed. Constitutional: She is oriented to person, place, and time. She appears well-developed and well-nourished.  Cardiovascular: Normal rate.  Respiratory: Effort normal.  Musculoskeletal:        General: Normal range of motion.  Neurological: She is alert and oriented to person, place, and time.  Skin: Skin is warm.    Review of Systems  Constitutional: Negative.   HENT: Negative.   Eyes: Negative.   Respiratory: Negative.   Cardiovascular: Negative.   Gastrointestinal: Negative.   Genitourinary: Negative.   Musculoskeletal: Positive for arthralgias and myalgias.  Skin: Negative.   Neurological: Negative.   Psychiatric/Behavioral: Positive for dysphoric mood.    Blood pressure 130/61, pulse 61, temperature 98 F (36.7 C), temperature source Oral, resp. rate 16, height 5\' 1"  (1.549 m), weight 78.9 kg, SpO2 93 %.Body mass index is 32.88 kg/m.  General Appearance: Disheveled  Eye Contact:  Good  Speech:  Clear and Coherent and Normal Rate  Volume:  Normal  Mood:  Anxious and Depressed  Affect:  Congruent  Thought Process:  Coherent and Descriptions of Associations: Intact  Orientation:  Full (Time, Place, and Person)  Thought Content:  WDL  Suicidal Thoughts:  Not currently, but reported wanting to die on admission to ED  Homicidal Thoughts:  No  Memory:  Immediate;   Fair Recent;    Fair Remote;   Fair  Judgement:  Fair  Insight:  Fair  Psychomotor Activity:  Normal  Concentration:  Concentration: Fair  Recall:  [employer] of Knowledge:  Fair  Language:  Fair  Akathisia:  No  Handed:  Right  AIMS (if indicated):     Assets:  Desire for Improvement Financial Resources/Insurance Housing Resilience  ADL's:  Impaired mildly  Cognition:  WNL  Sleep:       Treatment Plan Summary: Daily contact with patient to assess and evaluate symptoms and progress in treatment and Medication management  Patient has presented with almost identical situation as her last admission in February.  Patient has been out of her medications for, according to her, almost a week and has been feeling lonely and depressed again.  After discussing with the patient medications will be restarted at their previous doses, these included Klonopin 1 mg nightly, Depakote, Prozac, Synthroid, Latuda, and trazodone.  Patient's glycemic medications have been restarted as well.  Patient's valproic acid level was 42 and her TSH was 3.016.  Patient's blood sugar this morning was 209.  There are also no drugs alcohol in the patient's system.  Discussed with CSW about potentially is reaching out to DSS to try to assist the patient with home health care and possibly a live-in nurses aide to assist patient with her ADLs.  Patient does present a little bit better mood than when she first arrived in the ED.  Hopeful discharge will be within the next 1 to 2 days.  Observation Level/Precautions:  15 minute checks  Laboratory:  Reviewed  Psychotherapy: Group therapy  Medications: See above and MAR  Consultations: As needed  Discharge Concerns:    Estimated LOS:  Other:     Physician Treatment Plan for Primary Diagnosis: Severe bipolar I disorder, current or most recent episode depressed ([place]) Long Term Goal(s): Improvement in symptoms so as ready for discharge  Short Term Goals: Ability to identify changes in  lifestyle to reduce recurrence of condition will improve, Ability to verbalize feelings will improve, Ability to disclose and discuss suicidal ideas, Ability to demonstrate self-control will improve, Ability to identify and develop effective coping behaviors will improve, Ability to maintain clinical measurements within normal limits will improve and Compliance with prescribed medications will improve  Physician Treatment Plan for Secondary Diagnosis: Principal Problem:   Severe bipolar I disorder, current or most recent episode depressed ([place])  Long Term Goal(s): Improvement in symptoms so as ready for discharge  Short Term Goals: Ability to identify changes in lifestyle to reduce recurrence of condition will improve, Ability to verbalize feelings will improve, Ability to disclose and discuss suicidal ideas, Ability to demonstrate self-control will improve, Ability to identify and develop effective coping behaviors will improve, Ability to maintain clinical measurements within normal limits will improve, Compliance with prescribed medications will improve and Ability to identify triggers associated with substance abuse/mental health issues will improve  I certify that inpatient services furnished can reasonably be expected to improve the patient's condition.    Lewis Shock, FNP 3/24/202111:24 AM

## 2019-08-11 NOTE — BHH Counselor (Signed)
CSW spoke with [facility], the Team Lead for [facility].    She reports that patient was approved for their services last week.  She reports that Peer Support services are looking for a Day Program for the patient as well.  CSW infomred that she was looking and was given [facility] [place] as possible sites when speaking with Medicaid.  CSW shared that per Medicaid in the [place] the patient could get Medicaid transportation as long as her insurance in approved for a [place] mentioned that patient informed her in February that she had been falling.  CSW reported that to her knowledge that patient did not report this for this hospitalization however, will have staff check on this.   Assunta Curtis, MSW, LCSW 08/11/2019 2:55 PM

## 2019-08-11 NOTE — B[facility] Group Notes (Signed)
Emotional Regulation 08/11/2019 1PM  Type of Therapy/Topic:  Group Therapy:  Emotion Regulation  Participation Level:  Did Not Attend   Description of Group:   The purpose of this group is to assist patients in learning to regulate negative emotions and experience positive emotions. Patients will be guided to discuss ways in which they have been vulnerable to their negative emotions. These vulnerabilities will be juxtaposed with experiences of positive emotions or situations, and patients will be challenged to use positive emotions to combat negative ones. Special emphasis will be placed on coping with negative emotions in conflict situations, and patients will process healthy conflict resolution skills.  Therapeutic Goals: 1. Patient will identify two positive emotions or experiences to reflect on in order to balance out negative emotions 2. Patient will label two or more emotions that they find the most difficult to experience 3. Patient will demonstrate positive conflict resolution skills through discussion and/or role plays  Summary of Patient Progress:       Therapeutic Modalities:   Cognitive Behavioral Therapy Feelings Identification Dialectical Behavioral Therapy   Yvette Rack, LCSW 08/11/2019 1:37 PM

## 2019-08-11 NOTE — [facility] Group Notes (Signed)
CSW attempted to return call to [place], [phone].  CSW left HIPAA compliant voicemail.  Assunta Curtis, MSW, LCSW 08/11/2019 2:20 PM

## 2019-08-11 NOTE — Plan of Care (Addendum)
Client currently has no SI when assessed on unit, however stated "maybe tomorrow", compliant with all medicine. Currently not progressing because they are new to unit.   Problem: Consults Goal: Concurrent Medical Patient Education Description: (See Patient Education Module for education specifics) Outcome: Not Progressing   Problem: [facility] Concurrent Medical Problem Goal: LTG-Pt will be physically stable and he/significant other Description: (Patient will be physically stable and he/significant other will be able to verbalize understanding of follow-up care and symptoms that would warrant further treatment) Outcome: Not Progressing Goal: STG-Vital signs will be within defined limits or stabilized Description: (STG- Vital signs will be within defined limits or stabilized for individual) Outcome: Not Progressing Goal: STG-Compliance with medication and/or treatment as ordered Description: (STG-Compliance with medication and/or treatment as ordered by MD) Outcome: Not Progressing Goal: STG-Verbalize two symptoms that would warrant further Description: (STG-Verbalize two symptoms that would warrant further treatment) Outcome: Not Progressing Goal: STG-Patient will participate in management/stabilization Description: (STG-Patient will participate in management/stabilization of medical condition) Outcome: Not Progressing Goal: STG-Other (Specify): Description: STG-Other Concurrent Medical (Specify): Outcome: Not Progressing   Problem: Education: Goal: Utilization of techniques to improve thought processes will improve Outcome: Not Progressing Goal: Knowledge of the prescribed therapeutic regimen will improve Outcome: Not Progressing   Problem: Activity: Goal: Interest or engagement in leisure activities will improve Outcome: Not Progressing Goal: Imbalance in normal sleep/wake cycle will improve Outcome: Not Progressing   Problem: Coping: Goal: Coping ability will improve Outcome:  Not Progressing Goal: Will verbalize feelings Outcome: Not Progressing   Problem: Health Behavior/Discharge Planning: Goal: Ability to make decisions will improve Outcome: Not Progressing Goal: Compliance with therapeutic regimen will improve Outcome: Not Progressing   Problem: Role Relationship: Goal: Will demonstrate positive changes in social behaviors and relationships Outcome: Not Progressing   Problem: Safety: Goal: Ability to disclose and discuss suicidal ideas will improve Outcome: Not Progressing Goal: Ability to identify and utilize support systems that promote safety will improve Outcome: Not Progressing   Problem: Self-Concept: Goal: Will verbalize positive feelings about self Outcome: Not Progressing Goal: Level of anxiety will decrease Outcome: Not Progressing   Problem: Education: Goal: Utilization of techniques to improve thought processes will improve Outcome: Not Progressing Goal: Knowledge of the prescribed therapeutic regimen will improve Outcome: Not Progressing   Problem: Activity: Goal: Interest or engagement in leisure activities will improve Outcome: Not Progressing Goal: Imbalance in normal sleep/wake cycle will improve Outcome: Not Progressing   Problem: Coping: Goal: Coping ability will improve Outcome: Not Progressing Goal: Will verbalize feelings Outcome: Not Progressing   Problem: Health Behavior/Discharge Planning: Goal: Ability to make decisions will improve Outcome: Not Progressing Goal: Compliance with therapeutic regimen will improve Outcome: Not Progressing   Problem: Role Relationship: Goal: Will demonstrate positive changes in social behaviors and relationships Outcome: Not Progressing   Problem: Safety: Goal: Ability to disclose and discuss suicidal ideas will improve Outcome: Not Progressing Goal: Ability to identify and utilize support systems that promote safety will improve Outcome: Not Progressing   Problem:  Self-Concept: Goal: Will verbalize positive feelings about self Outcome: Not Progressing Goal: Level of anxiety will decrease Outcome: Not Progressing   Problem: Education: Goal: Ability to make informed decisions regarding treatment will improve 08/11/2019 0006 by Andrika Peraza P, LPN Outcome: Not Progressing 08/11/2019 0004 by Brayton Layman P, LPN Outcome: Progressing   Problem: Coping: Goal: Coping ability will improve 08/11/2019 0006 by Sheronica Corey P, LPN Outcome: Not Progressing 08/11/2019 0004 by Brayton Layman P, LPN Outcome: Progressing  Problem: Health Behavior/Discharge Planning: Goal: Identification of resources available to assist in meeting health care needs will improve Outcome: Not Progressing   Problem: Medication: Goal: Compliance with prescribed medication regimen will improve 08/11/2019 0006 by Garnet Chatmon P, LPN Outcome: Not Progressing 08/11/2019 0004 by Brayton Layman P, LPN Outcome: Progressing   Problem: Self-Concept: Goal: Ability to disclose and discuss suicidal ideas will improve Outcome: Not Progressing Goal: Will verbalize positive feelings about self Outcome: Not Progressing

## 2019-08-11 NOTE — B[facility] Suicide Risk Assessment (Signed)
[place] INPATIENT:  Family/Significant Other Suicide Prevention Education  Suicide Prevention Education:  Patient Refusal for Family/Significant Other Suicide Prevention Education: The patient [name] has refused to provide written consent for family/significant other to be provided Family/Significant Other Suicide Prevention Education during admission and/or prior to discharge.  Physician notified.  SPE completed with pt, as pt refused to consent to family contact. SPI pamphlet provided to pt and pt was encouraged to share information with support network, ask questions, and talk about any concerns relating to SPE. Pt denies access to guns/firearms and verbalized understanding of information provided. Mobile Crisis information also provided to pt.   Rozann Lesches 08/11/2019, 10:13 AM
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
## 2019-08-11 NOTE — Progress Notes (Signed)
Recreation Therapy Notes  Date: 08/11/2019   Time: 9:30 am   Location: Craft room   Behavioral response: N/A   Intervention Topic: Time-Management        Discussion/Intervention: Patient did not attend group.   Clinical Observations/Feedback:  Patient did not attend group.   Mikal Wisman LRT/CTRS        Ethanjames Fontenot 08/11/2019 11:10 AM

## 2019-08-12 LAB — GLUCOSE, CAPILLARY
Glucose-Capillary: 225 mg/dL — ABNORMAL HIGH (ref 70–99)
Glucose-Capillary: 157 mg/dL — ABNORMAL HIGH (ref 70–99)
Glucose-Capillary: 172 mg/dL — ABNORMAL HIGH (ref 70–99)
Glucose-Capillary: 121 mg/dL — ABNORMAL HIGH (ref 70–99)

## 2019-08-12 NOTE — Plan of Care (Signed)
Patient has been active on the unit and observed by this writer coloring and listening to music in the day room. Pt denies SI  Problem: Activity: Goal: Interest or engagement in leisure activities will improve Outcome: Progressing   Problem: Safety: Goal: Ability to disclose and discuss suicidal ideas will improve Outcome: Progressing

## 2019-08-12 NOTE — Progress Notes (Signed)
Recreation Therapy Notes  Date: 08/12/2019  Time: 9:30 am  Location: Craft Room  Behavioral response: Appropriate   Intervention Topic: Relaxation   Discussion/Intervention:  Group content today was focused on relaxation. The group defined relaxation and identified healthy ways to relax. Individuals expressed how much time they spend relaxing. Patients expressed how much their life would be if they did not make time for themselves to relax. The group stated ways they could improve their relaxation techniques in the future.  Individuals participated in the intervention "Time to Relax" where they had a chance to experience different relaxation techniques.  Clinical Observations/Feedback:  Patient came to group and define relaxation as the body, chest and muscles calming down. She expressed that she tries to relax by reading, music and doing puzzles. Participant explained that it is important to relax because the body can only take so much. Individual was social with peers and staff while participant in the intervention during group.  Duanna Runk LRT/CTRS           Lilymae Swiech 08/12/2019 12:08 PM

## 2019-08-12 NOTE — BHH Counselor (Signed)
CSW notes that patient's behavior and mood was improved from earliuer presentation. Patient stated "I do have something to live for!  I know there eis more to my life than what I have experienced!"  CSW followed up with patient on Day Programs in [place] and patient reports that she is open to Day Programs there if they can provide transportation.    CSW also followed up on reports that pt struggled with getting in and out of her bathtub, falling and caring for herself.  Patient denied this stating that this was in the past.  CSW informed that call had been made to DSS to see what if any services can be provided and pt stated that she did not feel that it was needed at this time.   CSW reminded pt that she had CST with [facility].  Assunta Curtis, MSW, LCSW 08/12/2019 1:49 PM

## 2019-08-12 NOTE — Tx Team (Addendum)
Interdisciplinary Treatment and Diagnostic Plan Update  08/12/2019 Time of Session: 9:00AM [name] MRN: [id number]  Principal Diagnosis: Severe bipolar I disorder, current or most recent episode depressed ([place])  Secondary Diagnoses: Principal Problem:   Severe bipolar I disorder, current or most recent episode depressed ([place])   Current Medications:  Current Facility-Administered Medications  Medication Dose Route Frequency Provider Last Rate Last Admin  . acetaminophen (TYLENOL) tablet 650 mg  650 mg Oral Q6H PRN Cristofano, Dorene Ar, MD      . alum & mag hydroxide-simeth (MAALOX/MYLANTA) 200-200-20 MG/5ML suspension 30 mL  30 mL Oral Q4H PRN Cristofano, Dorene Ar, MD      . clonazePAM (KLONOPIN) tablet 1 mg  1 mg Oral QHS Cristofano, Dorene Ar, MD   1 mg at 08/11/19 2108  . divalproex (DEPAKOTE ER) 24 hr tablet 1,000 mg  1,000 mg Oral QHS Cristofano, Dorene Ar, MD   1,000 mg at 08/11/19 2108  . enalapril (VASOTEC) tablet 5 mg  5 mg Oral BID Cristofano, Dorene Ar, MD   5 mg at 08/11/19 1655  . FLUoxetine (PROZAC) capsule 40 mg  40 mg Oral Daily Cristofano, Dorene Ar, MD   40 mg at 08/12/19 0735  . fluticasone (FLONASE) 50 MCG/ACT nasal spray 2 spray  2 spray Each Nare BID Money, Lowry Ram, FNP   2 spray at 08/12/19 [phone]  . glipiZIDE (GLUCOTROL XL) 24 hr tablet 5 mg  5 mg Oral BID Cristofano, Dorene Ar, MD   5 mg at 08/12/19 0734  . insulin aspart (novoLOG) injection 0-15 Units  0-15 Units Subcutaneous TID WC Cristofano, Dorene Ar, MD   5 Units at 08/12/19 0735  . levothyroxine (SYNTHROID) tablet 88 mcg  88 mcg Oral QAC breakfast Dixie Dials, MD   88 mcg at 08/12/19 0734  . linagliptin (TRADJENTA) tablet 5 mg  5 mg Oral Daily Cristofano, Dorene Ar, MD   5 mg at 08/12/19 0734  . loratadine (CLARITIN) tablet 10 mg  10 mg Oral Daily Cristofano, Dorene Ar, MD   10 mg at 08/12/19 0734  . lurasidone (LATUDA) tablet 20 mg  20 mg Oral Q supper Cristofano, Dorene Ar, MD   20 mg at 08/11/19 1650  . magnesium  hydroxide (MILK OF MAGNESIA) suspension 30 mL  30 mL Oral Daily PRN Cristofano, Paul A, MD      . metFORMIN (GLUCOPHAGE) tablet 500 mg  500 mg Oral BID WC Cristofano, Dorene Ar, MD   500 mg at 08/12/19 0734  . olopatadine (PATANOL) 0.1 % ophthalmic solution 1 drop  1 drop Both Eyes Daily Money, Lowry Ram, FNP   1 drop at 08/12/19 0739  . rosuvastatin (CRESTOR) tablet 40 mg  40 mg Oral Daily Cristofano, Dorene Ar, MD   40 mg at 08/11/19 1655  . traZODone (DESYREL) tablet 100 mg  100 mg Oral QHS Cristofano, Dorene Ar, MD   100 mg at 08/11/19 2108   PTA Medications: Medications Prior to Admission  Medication Sig Dispense Refill Last Dose  . clonazePAM (KLONOPIN) 1 MG tablet Take 1 tablet (1 mg total) by mouth at bedtime. 30 tablet 1   . divalproex (DEPAKOTE ER) 500 MG 24 hr tablet Take 2 tablets (1,000 mg total) by mouth at bedtime. 60 tablet 1   . enalapril (VASOTEC) 5 MG tablet Take 1 tablet (5 mg total) by mouth 2 (two) times daily. 60 tablet 0   . FARXIGA 5 MG TABS tablet Take 5 mg by mouth daily.     Marland Kitchen  FLUoxetine (PROZAC) 40 MG capsule Take 1 capsule (40 mg total) by mouth daily. 30 capsule 1   . glipiZIDE (GLUCOTROL XL) 5 MG 24 hr tablet Take 2 tablets by mouth 2 (two) times daily.     Marland Kitchen levothyroxine (SYNTHROID) 88 MCG tablet Take 88 mcg by mouth daily before breakfast.      . linagliptin (TRADJENTA) 5 MG TABS tablet Take 1 tablet (5 mg total) by mouth daily. 30 tablet 0   . loratadine (CLARITIN) 10 MG tablet Take 1 tablet (10 mg total) by mouth daily. 30 tablet 1   . lurasidone (LATUDA) 20 MG TABS tablet Take 1 tablet (20 mg total) by mouth daily with supper. 30 tablet 0   . metFORMIN (GLUCOPHAGE) 500 MG tablet Take 500 mg by mouth 2 (two) times daily.     Marland Kitchen PAZEO 0.7 % SOLN Take 1 drop by mouth daily.      . rosuvastatin (CRESTOR) 40 MG tablet Take 40 mg by mouth daily.     . traZODone (DESYREL) 100 MG tablet Take 1 tablet (100 mg total) by mouth at bedtime. 30 tablet 0     Patient Stressors:  Marital or family conflict Medication change or noncompliance  Patient Strengths: Ability for insight Motivation for treatment/growth  Treatment Modalities: Medication Management, Group therapy, Case management,  1 to 1 session with clinician, Psychoeducation, Recreational therapy.   Physician Treatment Plan for Primary Diagnosis: Severe bipolar I disorder, current or most recent episode depressed ([place]) Long Term Goal(s): Improvement in symptoms so as ready for discharge Improvement in symptoms so as ready for discharge   Short Term Goals: Ability to identify changes in lifestyle to reduce recurrence of condition will improve Ability to verbalize feelings will improve Ability to disclose and discuss suicidal ideas Ability to demonstrate self-control will improve Ability to identify and develop effective coping behaviors will improve Ability to maintain clinical measurements within normal limits will improve Compliance with prescribed medications will improve Ability to identify changes in lifestyle to reduce recurrence of condition will improve Ability to verbalize feelings will improve Ability to disclose and discuss suicidal ideas Ability to demonstrate self-control will improve Ability to identify and develop effective coping behaviors will improve Ability to maintain clinical measurements within normal limits will improve Compliance with prescribed medications will improve Ability to identify triggers associated with substance abuse/mental health issues will improve  Medication Management: Evaluate patient's response, side effects, and tolerance of medication regimen.  Therapeutic Interventions: 1 to 1 sessions, Unit Group sessions and Medication administration.  Evaluation of Outcomes: Not Progressing  Physician Treatment Plan for Secondary Diagnosis: Principal Problem:   Severe bipolar I disorder, current or most recent episode depressed ([place])  Long Term Goal(s):  Improvement in symptoms so as ready for discharge Improvement in symptoms so as ready for discharge   Short Term Goals: Ability to identify changes in lifestyle to reduce recurrence of condition will improve Ability to verbalize feelings will improve Ability to disclose and discuss suicidal ideas Ability to demonstrate self-control will improve Ability to identify and develop effective coping behaviors will improve Ability to maintain clinical measurements within normal limits will improve Compliance with prescribed medications will improve Ability to identify changes in lifestyle to reduce recurrence of condition will improve Ability to verbalize feelings will improve Ability to disclose and discuss suicidal ideas Ability to demonstrate self-control will improve Ability to identify and develop effective coping behaviors will improve Ability to maintain clinical measurements within normal limits will improve Compliance with  prescribed medications will improve Ability to identify triggers associated with substance abuse/mental health issues will improve     Medication Management: Evaluate patient's response, side effects, and tolerance of medication regimen.  Therapeutic Interventions: 1 to 1 sessions, Unit Group sessions and Medication administration.  Evaluation of Outcomes: Not Progressing   RN Treatment Plan for Primary Diagnosis: Severe bipolar I disorder, current or most recent episode depressed ([place]) Long Term Goal(s): Knowledge of disease and therapeutic regimen to maintain health will improve  Short Term Goals: Ability to demonstrate self-control, Ability to participate in decision making will improve, Ability to verbalize feelings will improve, Ability to disclose and discuss suicidal ideas, Ability to identify and develop effective coping behaviors will improve and Compliance with prescribed medications will improve  Medication Management: RN will administer medications as  ordered by provider, will assess and evaluate patient's response and provide education to patient for prescribed medication. RN will report any adverse and/or side effects to prescribing provider.  Therapeutic Interventions: 1 on 1 counseling sessions, Psychoeducation, Medication administration, Evaluate responses to treatment, Monitor vital signs and CBGs as ordered, Perform/monitor CIWA, COWS, AIMS and Fall Risk screenings as ordered, Perform wound care treatments as ordered.  Evaluation of Outcomes: Not Progressing   LCSW Treatment Plan for Primary Diagnosis: Severe bipolar I disorder, current or most recent episode depressed ([place]) Long Term Goal(s): Safe transition to appropriate next level of care at discharge, Engage patient in therapeutic group addressing interpersonal concerns.  Short Term Goals: Engage patient in aftercare planning with referrals and resources, Increase social support, Increase ability to appropriately verbalize feelings, Increase emotional regulation, Facilitate acceptance of mental health diagnosis and concerns and Increase skills for wellness and recovery  Therapeutic Interventions: Assess for all discharge needs, 1 to 1 time with Social worker, Explore available resources and support systems, Assess for adequacy in community support network, Educate family and significant other(s) on suicide prevention, Complete Psychosocial Assessment, Interpersonal group therapy.  Evaluation of Outcomes: Not Progressing   Progress in Treatment: Attending groups: No. Participating in groups: No. Taking medication as prescribed: Yes. Toleration medication: Yes. Family/Significant other contact made: Yes, individual(s) contacted:  SPE completed with the patient.  Patient reports that she has no supports. Patient understands diagnosis: Yes. Discussing patient identified problems/goals with staff: Yes. Medical problems stabilized or resolved: Yes. Denies suicidal/homicidal  ideation: No. Issues/concerns per patient self-inventory: No. Other: none  New problem(s) identified: No, Describe:  none  New Short Term/Long Term Goal(s): medication management for mood stabilization; elimination of SI thoughts; development of comprehensive mental wellness plan.  Patient Goals:  "I don't have any goals.  I am too old for goals.  There is nothing to make me live anymore."  Discharge Plan or Barriers: Pt reports plans to continue with her CST team with [facility] at discharge.  CSW has attempted to locate a Day Program for pt in [facility], however, they are closed due to Covid-19 restrictions.  CSW will follow up with patient to see if she is willing to try a Day Program in [place].  CSW researched if [employer] transportation will provide transportation and it is possible that they will or the Day Programs in [place] might.  CSW has also contacted DSS to find out about possible resources that they can offer patient in home.   Reason for Continuation of Hospitalization: Anxiety Depression Medication stabilization Suicidal ideation  Estimated Length of Stay:  1-7 days  Recreational Therapy: Patient Stressors: N/A Patient Goal: Patient will engage in groups  without prompting or encouragement from LRT x3 group sessions within 5 recreation therapy group sessions  Attendees: Patient: [name] 08/12/2019 10:02 AM  Physician: Dr. Weber Cooks, MD 08/12/2019 10:02 AM  Nursing: Lyda Kalata, RN 08/12/2019 10:02 AM  RN Care Manager: 08/12/2019 10:02 AM  Social Worker: Assunta Curtis, LCSW 08/12/2019 10:02 AM  Recreational Therapist: Roanna Epley, Reather Converse, LRT 08/12/2019 10:02 AM  Other: Sanjuana Kava, LCSW 08/12/2019 10:02 AM  Other:  08/12/2019 10:02 AM  Other: 08/12/2019 10:02 AM    Scribe for Treatment Team: Rozann Lesches, LCSW 08/12/2019 10:02 AM

## 2019-08-12 NOTE — [facility] Group Notes (Signed)
LCSW Group Therapy Note  08/12/2019 1:00 PM  Type of Therapy/Topic:  Group Therapy:  Balance in Life  Participation Level:  Active  Description of Group:    This group will address the concept of balance and how it feels and looks when one is unbalanced. Patients will be encouraged to process areas in their lives that are out of balance and identify reasons for remaining unbalanced. Facilitators will guide patients in utilizing problem-solving interventions to address and correct the stressor making their life unbalanced. Understanding and applying boundaries will be explored and addressed for obtaining and maintaining a balanced life. Patients will be encouraged to explore ways to assertively make their unbalanced needs known to significant others in their lives, using other group members and facilitator for support and feedback.  Therapeutic Goals: 1. Patient will identify two or more emotions or situations they have that consume much of in their lives. 2. Patient will identify signs/triggers that life has become out of balance:  3. Patient will identify two ways to set boundaries in order to achieve balance in their lives:  4. Patient will demonstrate ability to communicate their needs through discussion and/or role plays  Summary of Patient Progress: Patient was an active participant in group.  Patient was supportive of other group members.  Patient did engage in group discussions on how balance can sometimes be disrupted by faulty thinking.  She was able to identify her cognitive distortions and provide examples.     Therapeutic Modalities:   Cognitive Behavioral Therapy Solution-Focused Therapy Assertiveness Training  Assunta Curtis MSW, LCSW 08/12/2019 12:49 PM

## 2019-08-12 NOTE — Progress Notes (Signed)
Pt says that she feels better then she did this morning. She has been calm, cooperative, med compliant, attending groups, resting and coloring. She has been reading "[employer]". Collier Bullock RN

## 2019-08-12 NOTE — Progress Notes (Signed)
[facility] MD Progress Note  08/12/2019 9:55 AM [name]  MRN:  [id number]   Subjective: Follow-up for this 74 year old female diagnosed with bipolar 1 disorder.  Patient reports today that she feels just as bad as she did when she came to the hospital.  She reports today that she feels completely worthless and hopeless.  She continues stating that she does feel that she would be better off dead because there is nothing to live for anymore.  She states that she cannot return back to the same living situation because that is what makes her feel depressed and wanting to die.  She reports that she understands that she has people that will come and see her and they are very nice to her and try to help take care of her about currently where she is at in her life she feels that there is nothing that will ever get any better.  Principal Problem: Severe bipolar I disorder, current or most recent episode depressed ([place]) Diagnosis: Principal Problem:   Severe bipolar I disorder, current or most recent episode depressed ([place])  Total Time spent with patient: 20 minutes  Past Psychiatric History: Patient has a lengthy history of mood instability and a diagnosis of bipolar depression she has had numerous visits to the ED as well as numerous hospitalizations for mental health issues  Past Medical History:  Past Medical History:  Diagnosis Date  . Arthritis   . Breast cancer ([place])   . Cancer [facility]) 2004   right breast ca  . Diabetes mellitus without complication ([place])   . Hypercholesterolemia   . Hypertension   . Hypothyroid   . Seasonal allergies     Past Surgical History:  Procedure Laterality Date  . ABDOMINAL HYSTERECTOMY    . BREAST BIOPSY Left    neg  . BREAST EXCISIONAL BIOPSY Right 2004   breast ca and lumpectomy  . BREAST LUMPECTOMY Right 2004   breast ca  . CHOLECYSTECTOMY    . COLONOSCOPY WITH PROPOFOL N/A 02/13/2017   Procedure: COLONOSCOPY WITH PROPOFOL;  Surgeon: Jonathon Bellows,  MD;  Location: [facility] ENDOSCOPY;  Service: Gastroenterology;  Laterality: N/A;  . KIDNEY STONE SURGERY     Family History:  Family History  Problem Relation Age of Onset  . Bladder Cancer Neg Hx   . Kidney cancer Neg Hx    Family Psychiatric  History: None reported Social History:  Social History   Substance and Sexual Activity  Alcohol Use No     Social History   Substance and Sexual Activity  Drug Use No    Social History   Socioeconomic History  . Marital status: Divorced    Spouse name: Not on file  . Number of children: Not on file  . Years of education: Not on file  . Highest education level: Not on file  Occupational History  . Not on file  Tobacco Use  . Smoking status: Former Smoker    Types: Cigarettes  . Smokeless tobacco: Never Used  Substance and Sexual Activity  . Alcohol use: No  . Drug use: No  . Sexual activity: Not Currently  Other Topics Concern  . Not on file  Social History Narrative  . Not on file   Social Determinants of Health   Financial Resource Strain:   . Difficulty of Paying Living Expenses:   Food Insecurity:   . Worried About Charity fundraiser in the Last Year:   . [place] in the  Last Year:   Transportation Needs:   . Film/video editor (Medical):   Marland Kitchen Lack of Transportation (Non-Medical):   Physical Activity:   . Days of Exercise per Week:   . Minutes of Exercise per Session:   Stress:   . Feeling of Stress :   Social Connections:   . Frequency of Communication with Friends and Family:   . Frequency of Social Gatherings with Friends and Family:   . Attends Religious Services:   . Active Member of Clubs or Organizations:   . Attends Archivist Meetings:   Marland Kitchen Marital Status:    Additional Social History:                         Sleep: Good  Appetite:  Fair  Current Medications: Current Facility-Administered Medications  Medication Dose Route Frequency Provider Last Rate Last Admin   . acetaminophen (TYLENOL) tablet 650 mg  650 mg Oral Q6H PRN Cristofano, Dorene Ar, MD      . alum & mag hydroxide-simeth (MAALOX/MYLANTA) 200-200-20 MG/5ML suspension 30 mL  30 mL Oral Q4H PRN Cristofano, Dorene Ar, MD      . clonazePAM (KLONOPIN) tablet 1 mg  1 mg Oral QHS Cristofano, Dorene Ar, MD   1 mg at 08/11/19 2108  . divalproex (DEPAKOTE ER) 24 hr tablet 1,000 mg  1,000 mg Oral QHS Cristofano, Dorene Ar, MD   1,000 mg at 08/11/19 2108  . enalapril (VASOTEC) tablet 5 mg  5 mg Oral BID Cristofano, Dorene Ar, MD   5 mg at 08/11/19 1655  . FLUoxetine (PROZAC) capsule 40 mg  40 mg Oral Daily Cristofano, Dorene Ar, MD   40 mg at 08/12/19 0735  . fluticasone (FLONASE) 50 MCG/ACT nasal spray 2 spray  2 spray Each Nare BID Mckinlee Dunk, Lowry Ram, FNP   2 spray at 08/12/19 [phone]  . glipiZIDE (GLUCOTROL XL) 24 hr tablet 5 mg  5 mg Oral BID Cristofano, Dorene Ar, MD   5 mg at 08/12/19 0734  . insulin aspart (novoLOG) injection 0-15 Units  0-15 Units Subcutaneous TID WC Cristofano, Dorene Ar, MD   5 Units at 08/12/19 0735  . levothyroxine (SYNTHROID) tablet 88 mcg  88 mcg Oral QAC breakfast Dixie Dials, MD   88 mcg at 08/12/19 0734  . linagliptin (TRADJENTA) tablet 5 mg  5 mg Oral Daily Cristofano, Dorene Ar, MD   5 mg at 08/12/19 0734  . loratadine (CLARITIN) tablet 10 mg  10 mg Oral Daily Cristofano, Dorene Ar, MD   10 mg at 08/12/19 0734  . lurasidone (LATUDA) tablet 20 mg  20 mg Oral Q supper Cristofano, Dorene Ar, MD   20 mg at 08/11/19 1650  . magnesium hydroxide (MILK OF MAGNESIA) suspension 30 mL  30 mL Oral Daily PRN Cristofano, Paul A, MD      . metFORMIN (GLUCOPHAGE) tablet 500 mg  500 mg Oral BID WC Cristofano, Dorene Ar, MD   500 mg at 08/12/19 0734  . olopatadine (PATANOL) 0.1 % ophthalmic solution 1 drop  1 drop Both Eyes Daily Lewanda Perea, Lowry Ram, FNP   1 drop at 08/12/19 0739  . rosuvastatin (CRESTOR) tablet 40 mg  40 mg Oral Daily Cristofano, Dorene Ar, MD   40 mg at 08/11/19 1655  . traZODone (DESYREL) tablet 100 mg  100 mg  Oral QHS Cristofano, Dorene Ar, MD   100 mg at 08/11/19 2108    Lab Results:  Results for  orders placed or performed during the hospital encounter of 08/10/19 (from the past 48 hour(s))  Hepatic function panel     Status: None   Collection Time: 08/11/19  6:50 AM  Result Value Ref Range   Total Protein 7.3 6.5 - 8.1 g/dL   Albumin 3.9 3.5 - 5.0 g/dL   AST 18 15 - 41 U/L   ALT 24 0 - 44 U/L   Alkaline Phosphatase 47 38 - 126 U/L   Total Bilirubin 0.6 0.3 - 1.2 mg/dL   Bilirubin, Direct <0.1 0.0 - 0.2 mg/dL   Indirect Bilirubin NOT CALCULATED 0.3 - 0.9 mg/dL    Comment: Performed at [facility], [address] [place], [place] [address]  Lipid panel     Status: Abnormal   Collection Time: 08/11/19  6:50 AM  Result Value Ref Range   Cholesterol 142 0 - 200 mg/dL   Triglycerides 236 (H) <150 mg/dL   HDL 43 >40 mg/dL   Total CHOL/HDL Ratio 3.3 RATIO   VLDL 47 (H) 0 - 40 mg/dL   LDL Cholesterol 52 0 - 99 mg/dL    Comment:        Total Cholesterol/HDL:CHD Risk Coronary Heart Disease Risk Table                     Men   Women  1/2 Average Risk   3.4   3.3  Average Risk       5.0   4.4  2 X Average Risk   9.6   7.1  3 X Average Risk  23.4   11.0        Use the calculated Patient Ratio above and the CHD Risk Table to determine the patient's CHD Risk.        ATP III CLASSIFICATION (LDL):  <100     mg/dL   Optimal  100-129  mg/dL   Near or Above                    Optimal  130-159  mg/dL   Borderline  160-189  mg/dL   High  >190     mg/dL   Very High Performed at [facility], [address] [place], [place] [address]   TSH     Status: None   Collection Time: 08/11/19  6:50 AM  Result Value Ref Range   TSH 3.016 0.350 - 4.500 uIU/mL    Comment: Performed by a 3rd Generation assay with a functional sensitivity of <=0.01 uIU/mL. Performed at [facility], [address] [place],  [address]   Valproic acid level     Status: Abnormal    Collection Time: 08/11/19  6:50 AM  Result Value Ref Range   Valproic Acid Lvl 42 (L) 50.0 - 100.0 ug/mL    Comment: Performed at [facility], [address] [place], [place] [address]  Glucose, capillary     Status: Abnormal   Collection Time: 08/11/19  6:55 AM  Result Value Ref Range   Glucose-Capillary 209 (H) 70 - 99 mg/dL    Comment: Glucose reference range applies only to samples taken after fasting for at least 8 hours.  Glucose, capillary     Status: Abnormal   Collection Time: 08/11/19 11:27 AM  Result Value Ref Range   Glucose-Capillary 198 (H) 70 - 99 mg/dL    Comment: Glucose reference range applies only to samples taken after fasting for at least 8 hours.  Glucose, capillary     Status: Abnormal   Collection Time: 08/11/19  4:19 PM  Result Value Ref Range   Glucose-Capillary 188 (H) 70 - 99 mg/dL    Comment: Glucose reference range applies only to samples taken after fasting for at least 8 hours.  Glucose, capillary     Status: Abnormal   Collection Time: 08/11/19  8:28 PM  Result Value Ref Range   Glucose-Capillary 143 (H) 70 - 99 mg/dL    Comment: Glucose reference range applies only to samples taken after fasting for at least 8 hours.  Glucose, capillary     Status: Abnormal   Collection Time: 08/12/19  7:04 AM  Result Value Ref Range   Glucose-Capillary 225 (H) 70 - 99 mg/dL    Comment: Glucose reference range applies only to samples taken after fasting for at least 8 hours.    Blood Alcohol level:  Lab Results  Component Value Date   ETH <10 08/10/2019   ETH <10 [id number]    Metabolic Disorder Labs: Lab Results  Component Value Date   HGBA1C 9.6 (H) 07/21/2018   MPG 228.82 07/21/2018   MPG 240 07/07/2018   No results found for: PROLACTIN Lab Results  Component Value Date   CHOL 142 08/11/2019   TRIG 236 (H) 08/11/2019   HDL 43 08/11/2019   CHOLHDL 3.3 08/11/2019   VLDL 47 (H) 08/11/2019   LDLCALC 52 08/11/2019   LDLCALC 75  07/21/2018    Physical Findings: AIMS: Facial and Oral Movements Muscles of Facial Expression: (excessive tongue and mouth movement), ,  ,  , Dental Status Current problems with teeth and/or dentures?: No Does patient usually wear dentures?: Yes  CIWA:    COWS:     Musculoskeletal: Strength & Muscle Tone: within normal limits Gait & Station: normal Patient leans: N/A  Psychiatric Specialty Exam: Physical Exam  Nursing note and vitals reviewed. Constitutional: She is oriented to person, place, and time. She appears well-developed and well-nourished.  Cardiovascular: Normal rate.  Respiratory: Effort normal.  Musculoskeletal:        General: Normal range of motion.  Neurological: She is alert and oriented to person, place, and time.  Skin: Skin is warm.  Psychiatric: Thought content normal.    Review of Systems  Constitutional: Negative.   HENT: Negative.   Eyes: Negative.   Respiratory: Negative.   Cardiovascular: Negative.   Gastrointestinal: Negative.   Genitourinary: Negative.   Musculoskeletal: Negative.   Skin: Negative.   Neurological: Negative.   Psychiatric/Behavioral: Positive for dysphoric mood and suicidal ideas.    Blood pressure (!) 100/53, pulse 61, temperature 98.6 F (37 C), temperature source Oral, resp. rate 12, height 5\' 1"  (1.549 m), weight 78.9 kg, SpO2 94 %.Body mass index is 32.88 kg/m.  General Appearance: Disheveled  Eye Contact:  Good  Speech:  Clear and Coherent and Normal Rate  Volume:  Increased  Mood:  Dysphoric  Affect:  Labile  Thought Process:  Coherent and Descriptions of Associations: Tangential  Orientation:  Full (Time, Place, and Person)  Thought Content:  Rumination and Tangential  Suicidal Thoughts:  Yes.  without intent/plan  Homicidal Thoughts:  No  Memory:  Immediate;   Fair Recent;   Fair Remote;   Fair  Judgement:  Fair  Insight:  Fair  Psychomotor Activity:  Normal  Concentration:  Concentration: Fair  Recall:   [employer] of Knowledge:  Fair  Language:  Fair  Akathisia:  No  Handed:  Right  AIMS (if indicated):     Assets:  Desire for Improvement Financial Resources/Insurance Housing Resilience Social Support  ADL's:  Intact  Cognition:  WNL  Sleep:  Number of Hours: 8.3   Assessment: Patient presents in her bed laying down but is awake.  Patient presents as irritable and dysphoric this morning.  She does appear to be labile as she will appear to be crying without tears at times and then the next she has a calm her voice and stating that people were nice to her and she appreciates that and then again she becomes irritable again.  Patient did report that she had went several days without her medications at home because she could not go pick them up from the hospital.  She was restarted on her Latuda as well as her Depakote and her Depakote was level upon arrival to the ED.  Consulted with Dr. Weber Cooks about possibility of medications, discussion as to switch her back to Seroquel which had worked for her in the past her to increase her [place].  However, after discussing further with patient she continues to state that she has not been complaint with medications and she may have not had medications for weeks since she left here in February. Will continue current medications at this time. Patient does not appear to be stable enough to return back to her home as she has continued to report suicidal ideations and wanting to die with worsening depression.  CSW did confirm that the patient has established with CST and physical therapy.  Patient's blood sugars have stabilized some and last 4 readings were 198, 188, 143, and 225.  Patient's last recorded blood pressure was 100/53 and the RN held the patient's enalapril dose.  Treatment Plan Summary: Daily contact with patient to assess and evaluate symptoms and progress in treatment and Medication management  Continue Klonopin 1 mg p.o. nightly Continue Depakote  ER 1000 mg p.o. nightly for bipolar disorder Continue Latuda 20 mg PO daily for bipolar disorder Continue enalapril 5 mg p.o. twice daily for hypertension Continue Prozac 40 mg p.o. daily for depression Continue Glucotrol XL 5 mg p.o. twice daily for diabetes Continue NovoLog sliding scale for diabetes Continue Synthroid 88 mcg p.o. daily for hypothyroidism Continue Tradjenta 5 mg p.o. daily for diabetes Continue Crestor 40 mg p.o. daily for dyslipidemia Continue metformin 500 mg p.o. twice daily with meals for diabetes  Lewis Shock, FNP 08/12/2019, 9:55 AM

## 2019-08-12 NOTE — Progress Notes (Signed)
Pt in the day room upon arrival to the unit. Pt stated she was feeling better today and thinks she will find out more information about outpatient [facility] tomorrow. Pt denies SI/HI/AVH, pain, she rates her anxiety and depression 9/10 but thinks it is getting better. Patient compliant with medication administration per MD orders. Patient given education, support and encouragement to be active in her treatment plan. Patient being monitored Q 15 minutes for safety per unit protocol. Patient remains safe on the unit.

## 2019-08-12 NOTE — Progress Notes (Signed)
I assessed pt and she rated her depression, anxiety , SI, HI and AVH. However she told me later that she didn't tell me how she really felt. Her self inventory sheet  rated depression 8/10 and anxiety and hopelessness both 10/10. She checked no that she did not have any suicidal thoughts however in her note "I have no reason to live". Collier Bullock RN

## 2019-08-12 NOTE — Plan of Care (Signed)
  Problem: Consults Goal: Concurrent Medical Patient Education Description: (See Patient Education Module for education specifics) Outcome: Not Progressing   Problem: [facility] Concurrent Medical Problem Goal: LTG-Pt will be physically stable and he/significant other Description: (Patient will be physically stable and he/significant other will be able to verbalize understanding of follow-up care and symptoms that would warrant further treatment) Outcome: Not Progressing Goal: STG-Vital signs will be within defined limits or stabilized Description: (STG- Vital signs will be within defined limits or stabilized for individual) Outcome: Not Progressing Goal: STG-Compliance with medication and/or treatment as ordered Description: (STG-Compliance with medication and/or treatment as ordered by MD) Outcome: Not Progressing Goal: STG-Verbalize two symptoms that would warrant further Description: (STG-Verbalize two symptoms that would warrant further treatment) Outcome: Not Progressing Goal: STG-Patient will participate in management/stabilization Description: (STG-Patient will participate in management/stabilization of medical condition) Outcome: Not Progressing Goal: STG-Other (Specify): Description: STG-Other Concurrent Medical (Specify): Outcome: Not Progressing   Problem: Activity: Goal: Interest or engagement in leisure activities will improve Outcome: Not Progressing Goal: Imbalance in normal sleep/wake cycle will improve Outcome: Not Progressing   Problem: Coping: Goal: Coping ability will improve Outcome: Not Progressing Goal: Will verbalize feelings Outcome: Not Progressing   Problem: Health Behavior/Discharge Planning: Goal: Ability to make decisions will improve Outcome: Not Progressing Goal: Compliance with therapeutic regimen will improve Outcome: Not Progressing   Problem: Role Relationship: Goal: Will demonstrate positive changes in social behaviors and  relationships Outcome: Not Progressing   Problem: Safety: Goal: Ability to disclose and discuss suicidal ideas will improve Outcome: Not Progressing Goal: Ability to identify and utilize support systems that promote safety will improve Outcome: Not Progressing   Problem: Self-Concept: Goal: Will verbalize positive feelings about self Outcome: Not Progressing Goal: Level of anxiety will decrease Outcome: Not Progressing   Problem: Education: Goal: Utilization of techniques to improve thought processes will improve Outcome: Not Progressing Goal: Knowledge of the prescribed therapeutic regimen will improve Outcome: Not Progressing   Problem: Activity: Goal: Interest or engagement in leisure activities will improve Outcome: Not Progressing Goal: Imbalance in normal sleep/wake cycle will improve Outcome: Not Progressing   Problem: Coping: Goal: Coping ability will improve Outcome: Not Progressing Goal: Will verbalize feelings Outcome: Not Progressing   Problem: Health Behavior/Discharge Planning: Goal: Ability to make decisions will improve Outcome: Not Progressing Goal: Compliance with therapeutic regimen will improve Outcome: Not Progressing   Problem: Role Relationship: Goal: Will demonstrate positive changes in social behaviors and relationships Outcome: Not Progressing   Problem: Safety: Goal: Ability to disclose and discuss suicidal ideas will improve Outcome: Not Progressing Goal: Ability to identify and utilize support systems that promote safety will improve Outcome: Not Progressing   Problem: Self-Concept: Goal: Will verbalize positive feelings about self Outcome: Not Progressing Goal: Level of anxiety will decrease Outcome: Not Progressing   Problem: Education: Goal: Ability to make informed decisions regarding treatment will improve Outcome: Not Progressing   Problem: Coping: Goal: Coping ability will improve Outcome: Not Progressing   Problem:  Health Behavior/Discharge Planning: Goal: Identification of resources available to assist in meeting health care needs will improve Outcome: Not Progressing   Problem: Medication: Goal: Compliance with prescribed medication regimen will improve Outcome: Not Progressing

## 2019-08-13 LAB — GLUCOSE, CAPILLARY
Glucose-Capillary: 220 mg/dL — ABNORMAL HIGH (ref 70–99)
Glucose-Capillary: 203 mg/dL — ABNORMAL HIGH (ref 70–99)

## 2019-08-13 MED ORDER — LEVOTHYROXINE SODIUM 88 MCG PO TABS: 88.0000 ug | ORAL_TABLET | Freq: Every day | ORAL | 1 refills | Status: DC

## 2019-08-13 MED ORDER — LINAGLIPTIN 5 MG PO TABS: 5.0000 mg | ORAL_TABLET | Freq: Every day | ORAL | 1 refills | Status: DC

## 2019-08-13 MED ORDER — FLUOXETINE HCL 40 MG PO CAPS: 40.0000 mg | ORAL_CAPSULE | Freq: Every day | ORAL | 1 refills | Status: DC

## 2019-08-13 MED ORDER — GLIPIZIDE ER 5 MG PO TB24: 5.0000 mg | ORAL_TABLET | Freq: Two times a day (BID) | ORAL | 1 refills | Status: DC

## 2019-08-13 MED ORDER — LURASIDONE HCL 20 MG PO TABS: 20.0000 mg | ORAL_TABLET | Freq: Every day | ORAL | 1 refills | Status: DC

## 2019-08-13 MED ORDER — CLONAZEPAM 1 MG PO TABS: 1.0000 mg | ORAL_TABLET | Freq: Every day | ORAL | 0 refills | Status: DC

## 2019-08-13 MED ORDER — ENALAPRIL MALEATE 5 MG PO TABS: 5.0000 mg | ORAL_TABLET | Freq: Two times a day (BID) | ORAL | 1 refills | Status: DC

## 2019-08-13 MED ORDER — DIVALPROEX SODIUM ER 500 MG PO TB24: 1000.0000 mg | ORAL_TABLET | Freq: Every day | ORAL | 1 refills | Status: DC

## 2019-08-13 MED ORDER — ROSUVASTATIN CALCIUM 40 MG PO TABS: 40.0000 mg | ORAL_TABLET | Freq: Every day | ORAL | 1 refills | Status: DC

## 2019-08-13 MED ORDER — LORATADINE 10 MG PO TABS: 10.0000 mg | ORAL_TABLET | Freq: Every day | ORAL | 1 refills | Status: DC

## 2019-08-13 MED ORDER — METFORMIN HCL 500 MG PO TABS: 500.0000 mg | ORAL_TABLET | Freq: Two times a day (BID) | ORAL | 1 refills | Status: DC

## 2019-08-13 MED ORDER — TRAZODONE HCL 100 MG PO TABS: 100.0000 mg | ORAL_TABLET | Freq: Every day | ORAL | 1 refills | Status: DC

## 2019-08-13 NOTE — Plan of Care (Signed)
  Problem: Depression Goal: STG - Patient will identify 3 healthy boundaries to utilize post d/c within 5 recreation therapy group sessions Description: STG - Patient will identify 3 healthy boundaries to utilize post d/c within 5 recreation therapy group sessions 08/13/2019 1130 by Ernest Haber, LRT Outcome: Adequate for Discharge 08/13/2019 1130 by Ernest Haber, LRT Outcome: Adequate for Discharge

## 2019-08-13 NOTE — Progress Notes (Signed)
Patient ID: [name], female   DOB: [birth year], 74 y.o.   MRN: [id number]  Discharge Note:  Patient denies SI/HI/AVH at this time. Discharge instructions, AVS, and transition record gone over with patient. Patient agrees to comply with medication management, follow-up visit, and outpatient therapy. Patient belongings returned to patient. Patient questions and concerns addressed and answered. Patient wheeled off unit. Patient discharged to home via [employer], transportation services.

## 2019-08-13 NOTE — Progress Notes (Signed)
D- Patient alert and oriented. Patient presents in a pleasant mood on assessment stating that she slept "well" last night and had no complaints to voice to this Probation officer. Patient denies any signs/symptoms of depression/anxiety stating "I did yesterday, and I let it out, I don't really feel depressed this morning". Patient also denies SI, HI, AVH, and pain at this time. Patient's goal for today is "staying awake, working on discharge plans; try to continue to feel better", in which she will "go to groups; take all my medicine", in order to achieve her goals.  A- Scheduled medications administered to patient, per MD orders. Support and encouragement provided.  Routine safety checks conducted every 15 minutes.  Patient informed to notify staff with problems or concerns.  R- No adverse drug reactions noted. Patient contracts for safety at this time. Patient compliant with medications and treatment plan. Patient receptive, calm, and cooperative. Patient interacts well with others on the unit.  Patient remains safe at this time.

## 2019-08-13 NOTE — BHH Counselor (Signed)
CSW spoke with [facility] with the [place] team.  Brayton Layman reports that at this time they are not transporting patients due to Covid restrictions.   She reports that she is unable to pick up medications at this time due to company policy.    She reports that it is typical that the patient's pharmacy will deliver the medications same day.  She reports that she will follow up with the patient by Monday.  Assunta Curtis, MSW, LCSW 08/13/2019 10:15 AM

## 2019-08-13 NOTE — BHH Group Notes (Signed)
Feelings Around Relapse 08/13/2019 9:30AM/1PM  Type of Therapy and Topic:  Group Therapy:  Feelings around Relapse and Recovery  Participation Level:  Active   Description of Group:    Patients in this group will discuss emotions they experience before and after a relapse. They will process how experiencing these feelings, or avoidance of experiencing them, relates to having a relapse. Facilitator will guide patients to explore emotions they have related to recovery. Patients will be encouraged to process which emotions are more powerful. They will be guided to discuss the emotional reaction significant others in their lives may have to patients' relapse or recovery. Patients will be assisted in exploring ways to respond to the emotions of others without this contributing to a relapse.  Therapeutic Goals: 1. Patient will identify two or more emotions that lead to a relapse for them 2. Patient will identify two emotions that result when they relapse 3. Patient will identify two emotions related to recovery 4. Patient will demonstrate ability to communicate their needs through discussion and/or role plays   Summary of Patient Progress: Actively and appropriately engaged in the group. Patient was able to provide support and validation to other group members.Patient practiced active listening when interacting with the facilitator and other group members. Patient completed relapse prevention plan and states "I plan to get better physically and mentally and take accountability." Patient interacted appropriately with members and respected boundaries during session.     Therapeutic Modalities:   Cognitive Behavioral Therapy Solution-Focused Therapy Assertiveness Training Relapse Prevention Therapy   Yvette Rack, LCSW 08/13/2019 1:51 PM

## 2019-08-13 NOTE — Progress Notes (Signed)
Recreation Therapy Notes  INPATIENT RECREATION TR PLAN  Patient Details Name: [name] MRN: [id number] DOB: Jan 08, 1946 Today's Date: 08/13/2019  Rec Therapy Plan Is patient appropriate for Therapeutic Recreation?: Yes Treatment times per week: At least 3 Estimated Length of Stay: 5-7 Days TR Treatment/Interventions: Group participation (Comment)  Discharge Criteria Pt will be discharged from therapy if:: Discharged Treatment plan/goals/alternatives discussed and agreed upon by:: Patient/family  Discharge Summary Short term goals set: Patient will identify 3 healthy boundaries to utilize post d/c within 5 recreation therapy group sessions Short term goals met: Adequate for discharge Progress toward goals comments: Groups attended Which groups?: Other (Comment)(Happiness, Relaxation) Reason goals not met: N/A Therapeutic equipment acquired: N/A Reason patient discharged from therapy: Discharge from hospital Pt/family agrees with progress & goals achieved: Yes Date patient discharged from therapy: 08/13/19   Avik Leoni 08/13/2019, 11:32 AM

## 2019-08-13 NOTE — BHH Counselor (Signed)
CSW attempted to contact [place] at [facility] to assist patient with receiving her medications at discharge. CSW was unable to speak with [facility] and left HIPAA compliant VM.  Assunta Curtis, MSW, LCSW 08/13/2019 9:37 AM

## 2019-08-13 NOTE — Discharge Summary (Signed)
Physician Discharge Summary Note  Patient:  [name] is an 74 y.o., female MRN:  [id number] DOB:  [birth year] Patient phone:  [phone] (home)  Patient address:   [address] [place] [address],  Total Time spent with patient: 30 minutes  Date of Admission:  08/10/2019 Date of Discharge: 08/13/19  Reason for Admission:  74 year old female who lives alone and independently. Patient is well-known to this unit as she has been admitted here several times in the past and has a chronic history of mood instability. She presented to [facility] ED with reports that her depression has gotten worse she feels like she can't always take care of herself at home like she shouldn't. She states she has issues with getting in and out of her tub and that she likes to cook but can't stand up that long. She states she doesn't really have any support at home and lives alone in an apartment.   Principal Problem: Severe bipolar I disorder, current or most recent episode depressed [facility]) Discharge Diagnoses: Principal Problem:   Severe bipolar I disorder, current or most recent episode depressed [facility])   Past Psychiatric History: Patient has a lengthy history of mood instability and a diagnosis of bipolar depression she has had numerous visits to the ED as well as numerous hospitalizations for mental health issues  Past Medical History:  Past Medical History:  Diagnosis Date  . Arthritis   . Breast cancer ([place])   . Cancer [facility]) 2004   right breast ca  . Diabetes mellitus without complication ([place])   . Hypercholesterolemia   . Hypertension   . Hypothyroid   . Seasonal allergies     Past Surgical History:  Procedure Laterality Date  . ABDOMINAL HYSTERECTOMY    . BREAST BIOPSY Left    neg  . BREAST EXCISIONAL BIOPSY Right 2004   breast ca and lumpectomy  . BREAST LUMPECTOMY Right 2004   breast ca  . CHOLECYSTECTOMY    . COLONOSCOPY WITH PROPOFOL N/A 02/13/2017   Procedure:  COLONOSCOPY WITH PROPOFOL;  Surgeon: Jonathon Bellows, MD;  Location: [facility] ENDOSCOPY;  Service: Gastroenterology;  Laterality: N/A;  . KIDNEY STONE SURGERY     Family History:  Family History  Problem Relation Age of Onset  . Bladder Cancer Neg Hx   . Kidney cancer Neg Hx    Family Psychiatric  History: None reported Social History:  Social History   Substance and Sexual Activity  Alcohol Use No     Social History   Substance and Sexual Activity  Drug Use No    Social History   Socioeconomic History  . Marital status: Divorced    Spouse name: Not on file  . Number of children: Not on file  . Years of education: Not on file  . Highest education level: Not on file  Occupational History  . Not on file  Tobacco Use  . Smoking status: Former Smoker    Types: Cigarettes  . Smokeless tobacco: Never Used  Substance and Sexual Activity  . Alcohol use: No  . Drug use: No  . Sexual activity: Not Currently  Other Topics Concern  . Not on file  Social History Narrative  . Not on file   Social Determinants of Health   Financial Resource Strain:   . Difficulty of Paying Living Expenses:   Food Insecurity:   . Worried About Charity fundraiser in the Last Year:   . Garden in the  Last Year:   Transportation Needs:   . Film/video editor (Medical):   Marland Kitchen Lack of Transportation (Non-Medical):   Physical Activity:   . Days of Exercise per Week:   . Minutes of Exercise per Session:   Stress:   . Feeling of Stress :   Social Connections:   . Frequency of Communication with Friends and Family:   . Frequency of Social Gatherings with Friends and Family:   . Attends Religious Services:   . Active Member of Clubs or Organizations:   . Attends Archivist Meetings:   Marland Kitchen Marital Status:     Hospital Course:  Patient remained on the [facility] unit for 2 days. The patient stabilized on medication and therapy. Patient was discharged on Klonopin 1 mg nightly, Depakote ER  1000 mg nightly, enalapril 5 mg twice daily, Prozac 40 mg daily, Glucotrol XL 5 mg twice daily, Synthroid 88 mcg daily, Tradjenta 5 mg daily, Claritin 10 mg daily, Latuda 20 mg daily with supper, metformin 500 mg twice daily with meals, [place] eyedrops, Crestor 40 mg daily, and trazodone 100 mg nightly. Patient has shown improvement with improved mood, affect, sleep, appetite, and interaction. Patient has attended group and participated. Patient has been seen in the day room interacting with peers and staff appropriately. Patient denies any SI/HI/AVH and contracts for safety. Patient agrees to follow up at [facility]. Patient is provided with prescriptions for their medications upon discharge.  CSW is assisted patient with ensuring that patient is set up with [facility] and has physical therapy coming.  Patient is also been assisted with establishing day program to attend as well as transportation to the county.  Patient did report difficulties with transportation and medications have been E prescribed to [place] [facility] and the will deliver to the patient's home per the patient.  [facility] is also been requested to assist with ensuring the patient receives her medications and may be picking them up for her.  Physical Findings: AIMS: Facial and Oral Movements Muscles of Facial Expression: (excessive tongue and mouth movement), ,  ,  , Dental Status Current problems with teeth and/or dentures?: No Does patient usually wear dentures?: Yes  CIWA:    COWS:     Musculoskeletal: Strength & Muscle Tone: within normal limits Gait & Station: normal Patient leans: N/A  Psychiatric Specialty Exam: Physical Exam  Nursing note and vitals reviewed. Constitutional: She is oriented to person, place, and time. She appears well-developed and well-nourished.  Cardiovascular: Normal rate.  Respiratory: Effort normal.  Musculoskeletal:        General: Normal range of motion.  Neurological: She is alert and oriented to  person, place, and time.  Skin: Skin is warm.    Review of Systems  Constitutional: Negative.   HENT: Negative.   Eyes: Negative.   Respiratory: Negative.   Cardiovascular: Negative.   Gastrointestinal: Negative.   Genitourinary: Negative.   Musculoskeletal: Negative.   Skin: Negative.   Neurological: Negative.   Psychiatric/Behavioral: Negative.     Blood pressure 124/71, pulse 79, temperature 98 F (36.7 C), resp. rate 16, height 5\' 1"  (1.549 m), weight 78.9 kg, SpO2 95 %.Body mass index is 32.88 kg/m.  General Appearance: Casual  Eye Contact:  Good  Speech:  Clear and Coherent and Normal Rate  Volume:  Normal  Mood:  Euthymic  Affect:  Congruent  Thought Process:  Coherent and Descriptions of Associations: Intact  Orientation:  Full (Time, Place, and Person)  Thought Content:  WDL  Suicidal Thoughts:  No  Homicidal Thoughts:  No  Memory:  Immediate;   Fair Recent;   Fair Remote;   Fair  Judgement:  Fair  Insight:  Fair  Psychomotor Activity:  Normal  Concentration:  Concentration: Fair  Recall:  [place] of Knowledge:  Fair  Language:  Fair  Akathisia:  No  Handed:  Right  AIMS (if indicated):     Assets:  Communication Skills Desire for Improvement Financial Resources/Insurance Housing Resilience Social Support  ADL's:  Intact  Cognition:  WNL  Sleep:  Number of Hours: 7.5     Have you used any form of tobacco in the last 30 days? (Cigarettes, Smokeless Tobacco, Cigars, and/or Pipes): No  Has this patient used any form of tobacco in the last 30 days? (Cigarettes, Smokeless Tobacco, Cigars, and/or Pipes) Yes, No  Blood Alcohol level:  Lab Results  Component Value Date   ETH <10 08/10/2019   ETH <10 [id number]    Metabolic Disorder Labs:  Lab Results  Component Value Date   HGBA1C 9.6 (H) 07/21/2018   MPG 228.82 07/21/2018   MPG 240 07/07/2018   No results found for: PROLACTIN Lab Results  Component Value Date   CHOL 142 08/11/2019    TRIG 236 (H) 08/11/2019   HDL 43 08/11/2019   CHOLHDL 3.3 08/11/2019   VLDL 47 (H) 08/11/2019   LDLCALC 52 08/11/2019   [place] 75 07/21/2018    See Psychiatric Specialty Exam and Suicide Risk Assessment completed by Attending Physician prior to discharge.  Discharge destination:  Home  Is patient on multiple antipsychotic therapies at discharge:  No   Has Patient had three or more failed trials of antipsychotic monotherapy by history:  No  Recommended Plan for Multiple Antipsychotic Therapies: NA  Discharge Instructions    Diet - low sodium heart healthy   Complete by: As directed    Increase activity slowly   Complete by: As directed      Allergies as of 08/13/2019      Reactions   Navane [thiothixene] Other (See Comments)   Reaction:  Unknown   Penicillins Rash   Has patient had a PCN reaction causing immediate rash, facial/tongue/throat swelling, SOB or lightheadedness with hypotension: No Has patient had a PCN reaction causing severe rash involving mucus membranes or skin necrosis: No Has patient had a PCN reaction that required hospitalization: No Has patient had a PCN reaction occurring within the last 10 years: No If all of the above answers are "NO", then may proceed with Cephalosporin use.      Medication List    STOP taking these medications   Farxiga 5 MG Tabs tablet Generic drug: dapagliflozin propanediol     TAKE these medications     Indication  clonazePAM 1 MG tablet Commonly known as: KLONOPIN Take 1 tablet (1 mg total) by mouth at bedtime.  Indication: Manic-Depression   divalproex 500 MG 24 hr tablet Commonly known as: DEPAKOTE ER Take 2 tablets (1,000 mg total) by mouth at bedtime.  Indication: MIXED BIPOLAR AFFECTIVE DISORDER   enalapril 5 MG tablet Commonly known as: VASOTEC Take 1 tablet (5 mg total) by mouth 2 (two) times daily.  Indication: High Blood Pressure Disorder   FLUoxetine 40 MG capsule Commonly known as: PROZAC Take 1  capsule (40 mg total) by mouth daily.  Indication: Depression   glipiZIDE 5 MG 24 hr tablet Commonly known as: GLUCOTROL XL Take 1 tablet (5 mg total) by mouth 2 (two)  times daily. What changed: how much to take  Indication: Type 2 Diabetes   levothyroxine 88 MCG tablet Commonly known as: SYNTHROID Take 1 tablet (88 mcg total) by mouth daily before breakfast.  Indication: Underactive Thyroid   linagliptin 5 MG Tabs tablet Commonly known as: TRADJENTA Take 1 tablet (5 mg total) by mouth daily.  Indication: Type 2 Diabetes   loratadine 10 MG tablet Commonly known as: CLARITIN Take 1 tablet (10 mg total) by mouth daily.  Indication: Perennial Allergic Rhinitis   lurasidone 20 MG Tabs tablet Commonly known as: LATUDA Take 1 tablet (20 mg total) by mouth daily with supper.  Indication: Depressive Phase of Manic-Depression   metFORMIN 500 MG tablet Commonly known as: GLUCOPHAGE Take 1 tablet (500 mg total) by mouth 2 (two) times daily with a meal. What changed: when to take this  Indication: Type 2 Diabetes   Pazeo 0.7 % Soln Generic drug: Olopatadine HCl Take 1 drop by mouth daily.  Indication: Allergic Conjunctivitis   rosuvastatin 40 MG tablet Commonly known as: CRESTOR Take 1 tablet (40 mg total) by mouth daily.  Indication: Disease involving Lipid Deposits in the Arteries   traZODone 100 MG tablet Commonly known as: DESYREL Take 1 tablet (100 mg total) by mouth at bedtime.  Indication: [place] Follow up.   Contact information: [place] [address] [phone]           Follow-up recommendations:  Continue activity as tolerated. Continue diet as recommended by your PCP. Ensure to keep all appointments with outpatient providers.  Comments:  Patient is instructed prior to discharge to: Take all medications as prescribed by his/her mental healthcare provider. Report  any adverse effects and or reactions from the medicines to his/her outpatient provider promptly. Patient has been instructed & cautioned: To not engage in alcohol and or illegal drug use while on prescription medicines. In the event of worsening symptoms, patient is instructed to call the crisis hotline, 911 and or go to the nearest ED for appropriate evaluation and treatment of symptoms. To follow-up with his/her primary care provider for your other medical issues, concerns and or health care needs.    Signed: Lowry Ram Casmer Yepiz, FNP 08/13/2019, 9:51 AM

## 2019-08-13 NOTE — BHH Counselor (Signed)
CSW spoke with [place] at [place].  CSW did not provide information due to HIPAA, however, per [place], pt is connected for the following services: physical therapy, nursing and CNA.  She reports that if the services are to continue for the patient a doctors orders will have to be put in.    She reports that she can be contacted at the following: [phone], [phone].  Assunta Curtis, MSW, LCSW 08/13/2019 10:45 AM

## 2019-08-13 NOTE — Progress Notes (Signed)
  [facility] Adult Case Management Discharge Plan :  Will you be returning to the same living situation after discharge:  Yes,  pt is returning home.  At discharge, do you have transportation home?: Yes,  CSW will assist with transportation needs. Do you have the ability to pay for your medications: Yes,  [employer] Medicare and Medicaid  Release of information consent forms completed and in the chart;  Patient's signature needed at discharge.  Patient to Follow up at: Follow-up Information    [place] Follow up.   Contact information: [place] [address] [phone]           Next level of care provider has access to [place] and Suicide Prevention discussed: Yes,  SPE completed with the patient.   Have you used any form of tobacco in the last 30 days? (Cigarettes, Smokeless Tobacco, Cigars, and/or Pipes): No  Has patient been referred to the Quitline?: Patient refused referral  Patient has been referred for addiction treatment: Pt. refused referral  Rozann Lesches, LCSW 08/13/2019, 10:25 AM

## 2019-08-13 NOTE — BHH Suicide Risk Assessment (Signed)
[facility] Discharge Suicide Risk Assessment   Principal Problem: Severe bipolar I disorder, current or most recent episode depressed ([place]) Discharge Diagnoses: Principal Problem:   Severe bipolar I disorder, current or most recent episode depressed ([place])   Total Time spent with patient: 30 minutes  Musculoskeletal: Strength & Muscle Tone: within normal limits Gait & Station: normal Patient leans: N/A  Psychiatric Specialty Exam: Review of Systems  Constitutional: Negative.   HENT: Negative.   Eyes: Negative.   Respiratory: Negative.   Cardiovascular: Negative.   Gastrointestinal: Negative.   Musculoskeletal: Negative.   Skin: Negative.   Neurological: Negative.   Psychiatric/Behavioral: Negative.     Blood pressure 124/71, pulse 79, temperature 98 F (36.7 C), resp. rate 16, height 5\' 1"  (1.549 m), weight 78.9 kg, SpO2 95 %.Body mass index is 32.88 kg/m.  General Appearance: Casual  Eye Contact::  Good  Speech:  Clear and [id number]  Volume:  Normal  Mood:  Euthymic  Affect:  Congruent  Thought Process:  Goal Directed  Orientation:  Full (Time, Place, and Person)  Thought Content:  Logical  Suicidal Thoughts:  No  Homicidal Thoughts:  No  Memory:  Immediate;   Fair Recent;   Fair Remote;   Fair  Judgement:  Fair  Insight:  Fair  Psychomotor Activity:  Decreased  Concentration:  Good  Recall:  [employer] of Knowledge:Fair  Language: Fair  Akathisia:  No  Handed:  Right  AIMS (if indicated):     Assets:  Desire for Improvement Resilience  Sleep:  Number of Hours: 7.5  Cognition: WNL  ADL's:  Intact   Mental Status Per Nursing Assessment::   On Admission:  Suicidal ideation indicated by patient  Demographic Factors:  Caucasian and Living alone  Loss Factors: NA  Historical Factors: Impulsivity  Risk Reduction Factors:   Positive social support, Positive therapeutic relationship and Positive coping skills or problem solving skills  Continued Clinical  Symptoms:  Bipolar Disorder:   Mixed State Depression:   Impulsivity  Cognitive Features That Contribute To Risk:  Polarized thinking    Suicide Risk:  Minimal: No identifiable suicidal ideation.  Patients presenting with no risk factors but with morbid ruminations; may be classified as minimal risk based on the severity of the depressive symptoms  Follow-up Information    [facility] Follow up.   Contact information: [place] [address] [phone]           Plan Of Care/Follow-up recommendations:  Activity:  Activity as tolerated Diet:  Regular diet Other:  Follow-up with outpatient treatment as indicated with [facility] and [facility]  Alethia Berthold, MD 08/13/2019, 1:25 PM

## 2019-08-13 NOTE — Progress Notes (Signed)
Recreation Therapy Notes  Date: 08/13/2019  Time: 9:30 am  Location: Craft Room  Behavioral response: Appropriate   Intervention Topic: Happiness    Discussion/Intervention:  Group content today was focused on Happiness. The group defined happiness and described where happiness comes from. Individuals identified what makes them happy and how they go about making others happy. Patients expressed things that stop them from being happy and ways they can improve their happiness. The group stated reasons why it is important to be happy. The group participated in the intervention "My Happiness", where they had a chance to identify and express things that make them happy. Clinical Observations/Feedback:  Patient came to group and define happiness as feeling good and having something to look forward to. She identified being around people, listening to music,reading and crocheting are things that make her happy. Participant explained that not having resources and violent people is what normally stops her from being happy. Individual was social with peers and staff while participant in the intervention during group.  [name] LRT/CTRS         [name] 08/13/2019 11:04 AM

## 2019-08-13 NOTE — BHH Counselor (Signed)
CSW received call from Dr. Melene Plan office who was requesting that patient be evaluated for ability to "live on her own".  Per staff at Dr. Melene Plan office the  Patient had mentioned falls.  CSW informed both psychiatrist and NP of the request from Dr. Melene Plan office.  Both report that PT consult is not warranted at this time due to the patient's denial of falls and no reported issues ambulating on the unit.  CSW provided this update to Dr. Melene Plan office.  Assunta Curtis, MSW, LCSW 08/13/2019 2:06 PM

## 2019-09-01 ENCOUNTER — Emergency Department: Payer: Medicare HMO

## 2019-09-01 ENCOUNTER — Other Ambulatory Visit: Payer: Self-pay

## 2019-09-01 ENCOUNTER — Emergency Department
Admission: EM | Admit: 2019-09-01 | Discharge: 2019-09-01 | Disposition: A | Discharge status: Home / Self Care | Payer: Medicare HMO | Attending: Emergency Medicine | Admitting: Emergency Medicine

## 2019-09-01 DIAGNOSIS — E039 Hypothyroidism, unspecified: Secondary | ICD-10-CM | POA: Insufficient documentation

## 2019-09-01 DIAGNOSIS — Z87891 Personal history of nicotine dependence: Secondary | ICD-10-CM | POA: Diagnosis not present

## 2019-09-01 DIAGNOSIS — S51012A Laceration without foreign body of left elbow, initial encounter: Secondary | ICD-10-CM | POA: Insufficient documentation

## 2019-09-01 DIAGNOSIS — Y929 Unspecified place or not applicable: Secondary | ICD-10-CM | POA: Insufficient documentation

## 2019-09-01 DIAGNOSIS — Y999 Unspecified external cause status: Secondary | ICD-10-CM | POA: Insufficient documentation

## 2019-09-01 DIAGNOSIS — Z79899 Other long term (current) drug therapy: Secondary | ICD-10-CM | POA: Insufficient documentation

## 2019-09-01 DIAGNOSIS — W19XXXA Unspecified fall, initial encounter: Secondary | ICD-10-CM

## 2019-09-01 DIAGNOSIS — S59902A Unspecified injury of left elbow, initial encounter: Secondary | ICD-10-CM | POA: Diagnosis present

## 2019-09-01 DIAGNOSIS — R519 Headache, unspecified: Secondary | ICD-10-CM | POA: Diagnosis not present

## 2019-09-01 DIAGNOSIS — E119 Type 2 diabetes mellitus without complications: Secondary | ICD-10-CM | POA: Diagnosis not present

## 2019-09-01 DIAGNOSIS — I10 Essential (primary) hypertension: Secondary | ICD-10-CM | POA: Insufficient documentation

## 2019-09-01 DIAGNOSIS — Y939 Activity, unspecified: Secondary | ICD-10-CM | POA: Insufficient documentation

## 2019-09-01 DIAGNOSIS — W010XXA Fall on same level from slipping, tripping and stumbling without subsequent striking against object, initial encounter: Secondary | ICD-10-CM | POA: Diagnosis not present

## 2019-09-01 DIAGNOSIS — Z7984 Long term (current) use of oral hypoglycemic drugs: Secondary | ICD-10-CM | POA: Diagnosis not present

## 2019-09-01 LAB — CBC
HCT: 44.9 % (ref 36.0–46.0)
Hemoglobin: 14.9 g/dL (ref 12.0–15.0)
MCH: 29.4 pg (ref 26.0–34.0)
MCHC: 33.2 g/dL (ref 30.0–36.0)
MCV: 88.7 fL (ref 80.0–100.0)
Platelets: 221 10*3/uL (ref 150–400)
RBC: 5.06 MIL/uL (ref 3.87–5.11)
RDW: 12.7 % (ref 11.5–15.5)
WBC: 8.9 10*3/uL (ref 4.0–10.5)
nRBC: 0 % (ref 0.0–0.2)

## 2019-09-01 LAB — BASIC METABOLIC PANEL
Anion gap: 11 (ref 5–15)
BUN: 22 mg/dL (ref 8–23)
CO2: 25 mmol/L (ref 22–32)
Calcium: 9.1 mg/dL (ref 8.9–10.3)
Chloride: 102 mmol/L (ref 98–111)
Creatinine, Ser: 1.23 mg/dL — ABNORMAL HIGH (ref 0.44–1.00)
GFR calc Af Amer: 50 mL/min — ABNORMAL LOW (ref >60)
GFR calc non Af Amer: 43 mL/min — ABNORMAL LOW (ref >60)
Glucose, Bld: 139 mg/dL — ABNORMAL HIGH (ref 70–99)
Potassium: 4.1 mmol/L (ref 3.5–5.1)
Sodium: 138 mmol/L (ref 135–145)

## 2019-09-01 LAB — TROPONIN I (HIGH SENSITIVITY)
Troponin I (High Sensitivity): 3 ng/L (ref <18)
Troponin I (High Sensitivity): 3 ng/L (ref <18)

## 2019-09-01 IMAGING — CT CT CERVICAL SPINE W/O CM
3 of 4 series · 12 of 33 positions shown, 14 images · non-contrast
Comparison: Head CT [DATE]

CLINICAL DATA: Headache, posttraumatic. Polytrauma, critical,
head/cervical spine injury suspected. Additional history provided:
Reported fall yesterday.

EXAM:
CT HEAD WITHOUT CONTRAST
CT CERVICAL SPINE WITHOUT CONTRAST
TECHNIQUE: Multidetector CT imaging of the head and cervical spine was
performed following the standard protocol without intravenous
contrast. Multiplanar CT image reconstructions of the cervical spine
were also generated.

[Series 4: sagittal bone · sagittal · 0.23mm/px · 5 of 54 slices shown, 6 images]
[im 18/54  bone]
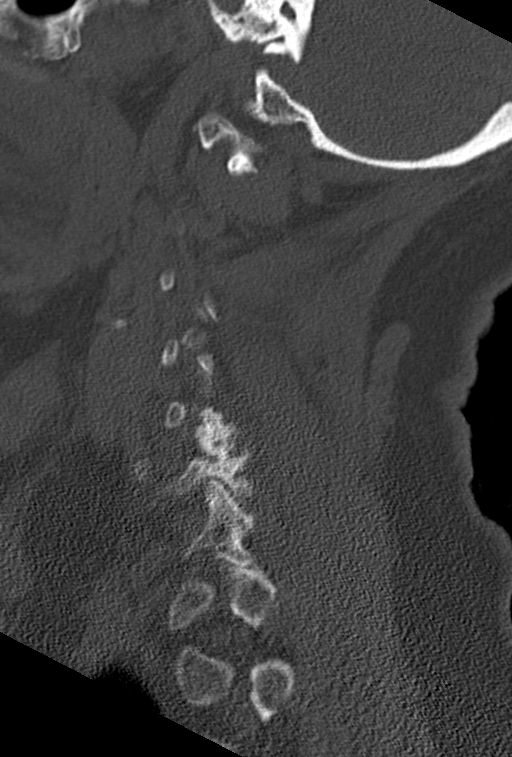
[im 23/54  bone]
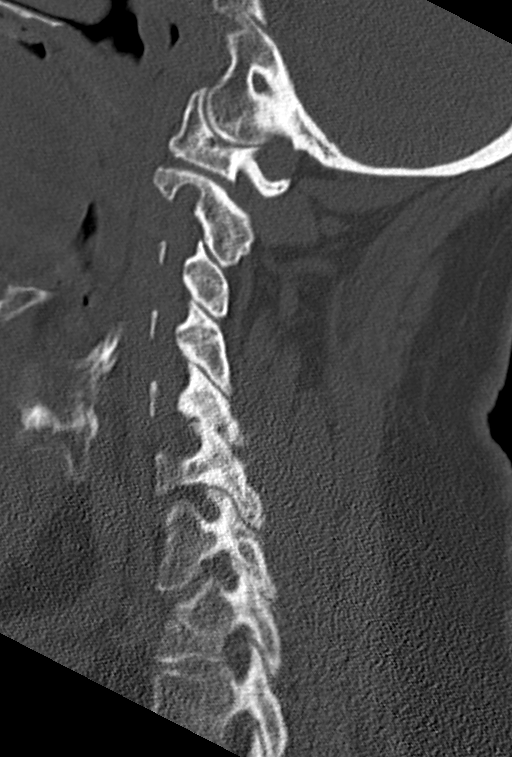
[im 27/54  soft-tissue]
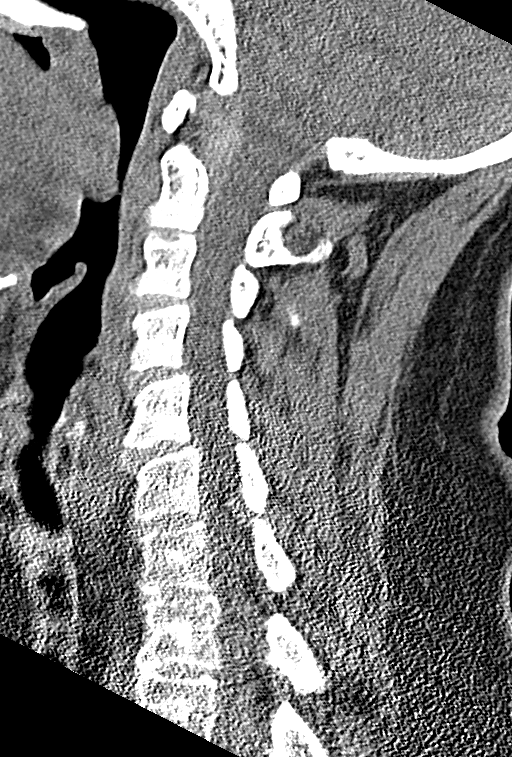
[im 27/54  bone]
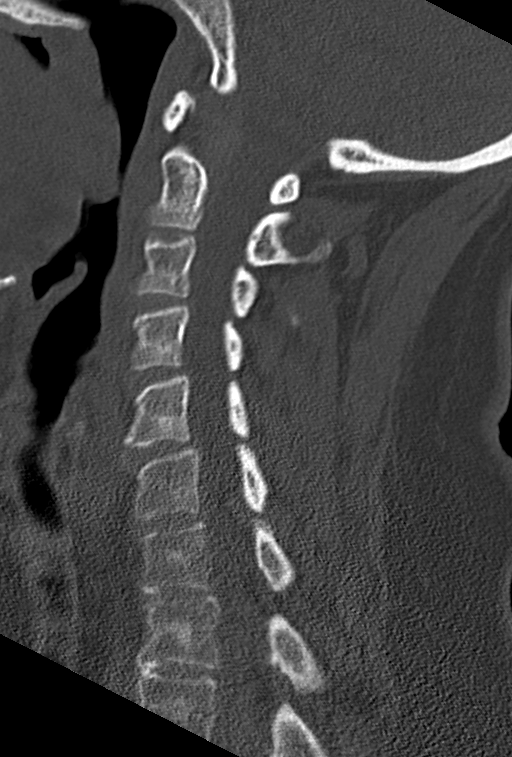
[im 31/54  bone]
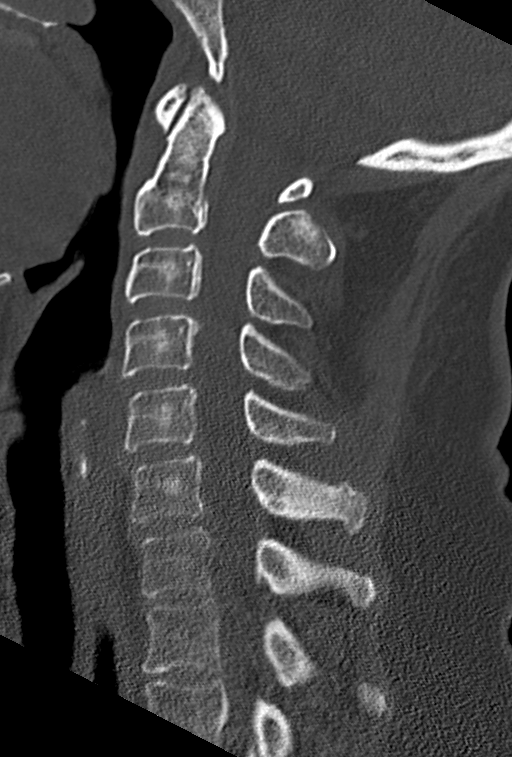
[im 36/54  bone]
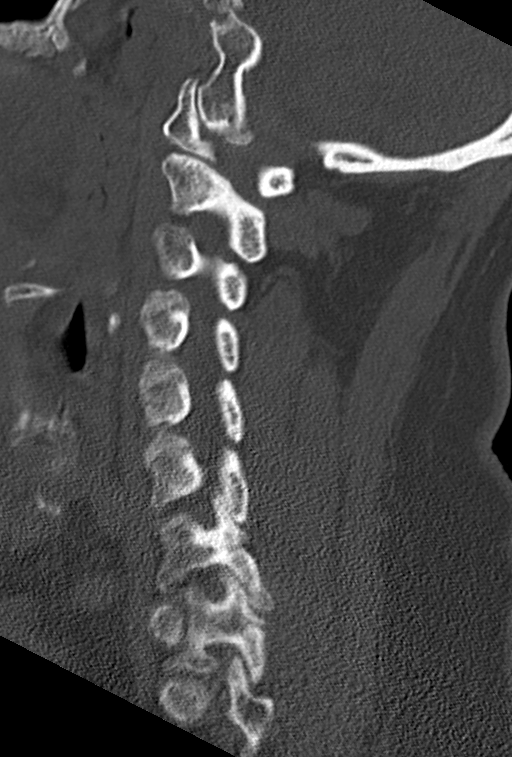

[Series 5: coronal bone · coronal · 0.25mm/px · 3 of 47 slices shown]
[im 10/47  bone]
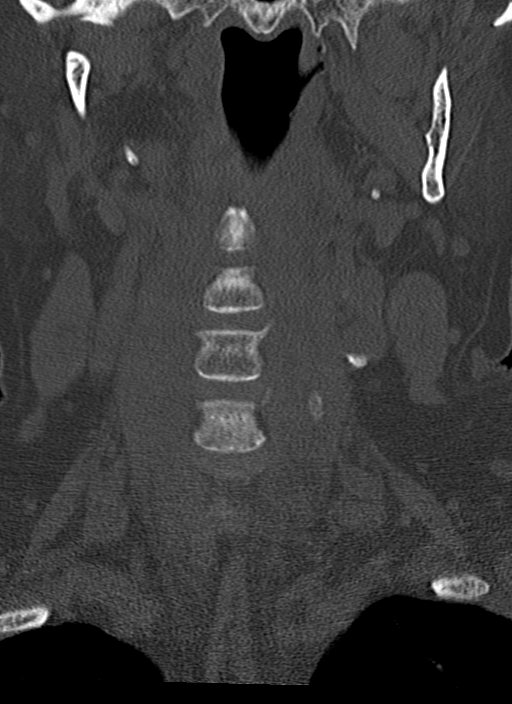
[im 19/47  bone]
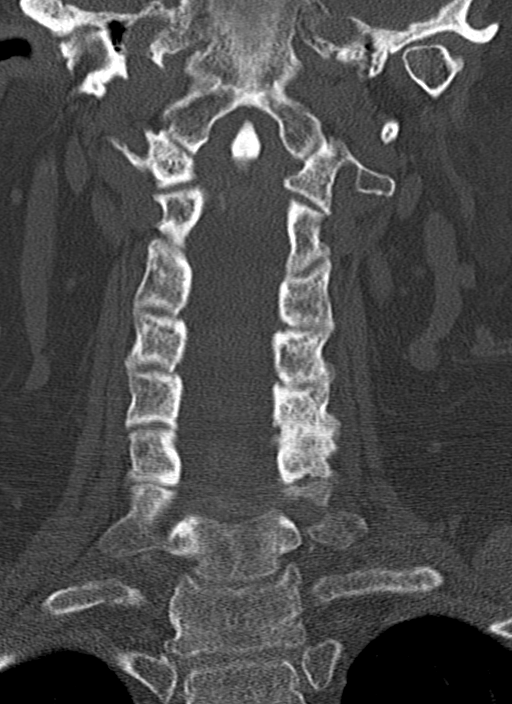
[im 28/47  bone]
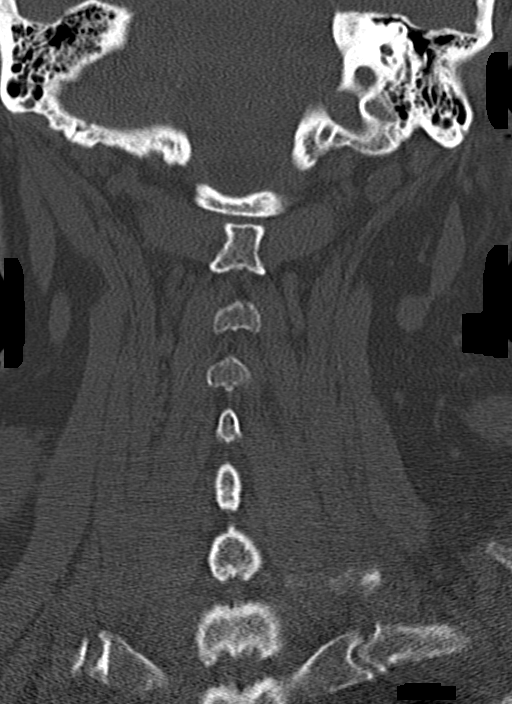

[Series 6: orthogonal bone · axial · 0.22mm/px · z∈[-275,-172]mm · 4 of 89 slices shown, 5 images]
[im 15/89  soft-tissue]
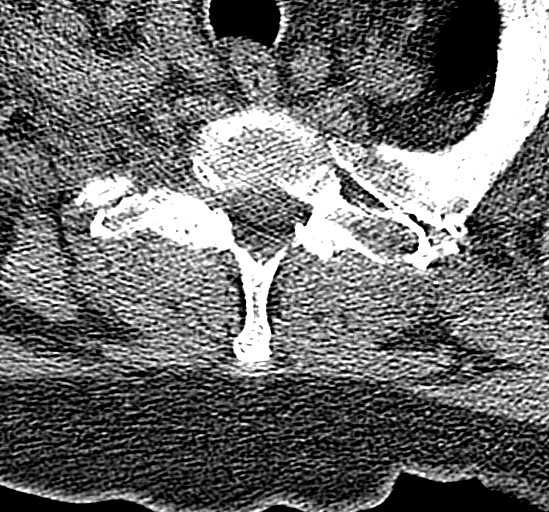
[im 15/89  bone]
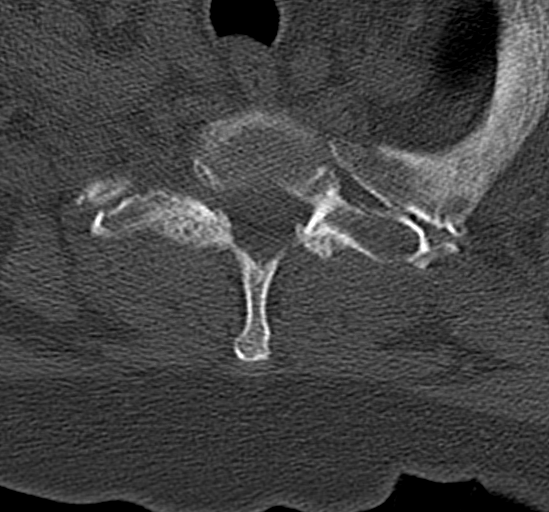
[im 30/89  bone]
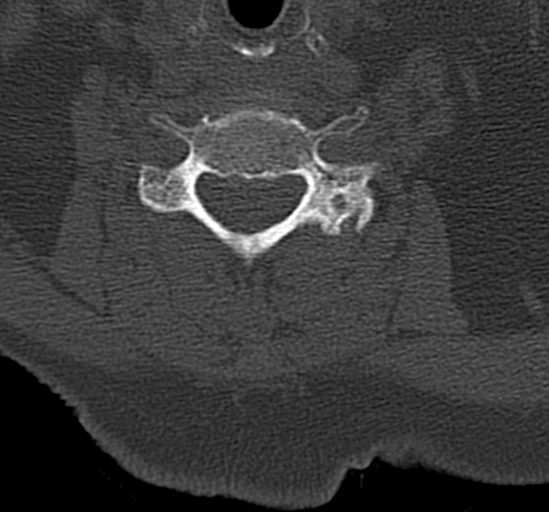
[im 59/89  bone]
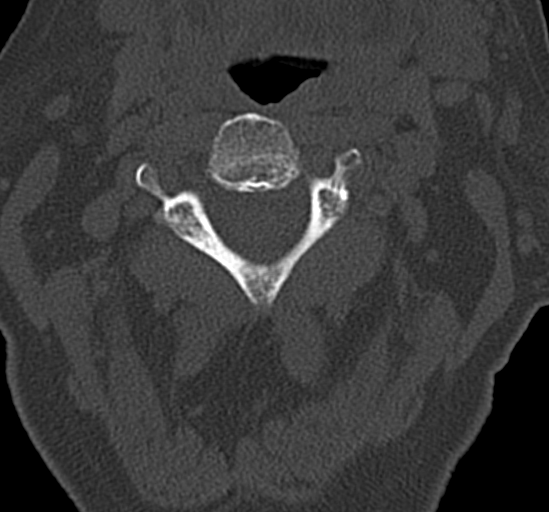
[im 74/89  bone]
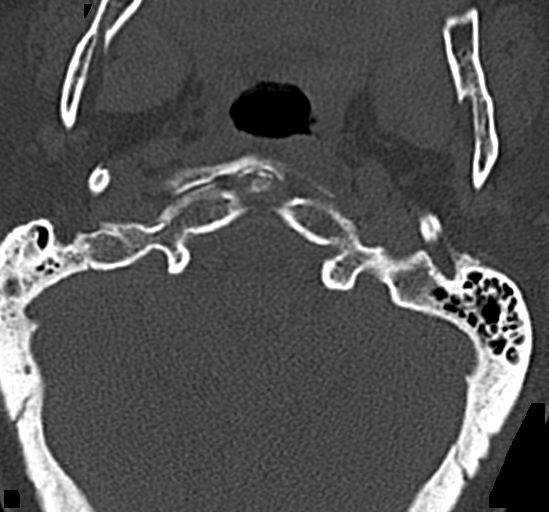

[12 of 33 positions shown; findings below may reference images not displayed]

FINDINGS: CT HEAD FINDINGS

Brain: There is no evidence of acute intracranial hemorrhage,
intracranial mass, midline shift or extra-axial fluid collection.No
demarcated cortical infarction. Unchanged mild ill-defined
hypoattenuation within the cerebral white matter is nonspecific, but
consistent with chronic small vessel ischemic disease. Stable, mild
generalized parenchymal atrophy.

Vascular: No hyperdense vessel.  Atherosclerotic calcifications.

Skull: Normal. Negative for fracture or focal lesion.

Sinuses/Orbits: Visualized orbits demonstrate no acute abnormality.
No significant paranasal sinus disease or mastoid effusion at the
imaged levels.

CT CERVICAL SPINE FINDINGS

Alignment: Straightening of the expected cervical lordosis. No
significant spondylolisthesis.

Skull base and vertebrae: The basion-dental and atlanto-dental
intervals are maintained.No evidence of acute fracture to the
cervical spine.

Soft tissues and spinal canal: No prevertebral fluid or swelling. No
visible canal hematoma.

Disc levels: No significant bony spinal canal or neural foraminal
narrowing at any level.

Upper chest: No consolidation within the imaged lung apices. No
visible pneumothorax.
IMPRESSION: CT head:

1. No evidence of acute intracranial abnormality.
2. Stable mild generalized parenchymal atrophy and chronic small
vessel ischemic disease.

CT cervical spine:

No evidence of acute fracture to the cervical spine.

## 2019-09-01 IMAGING — CR DG HIP (WITH OR WITHOUT PELVIS) 2-3V*L*
1 series · 3 of 3 positions shown · non-contrast
Comparison: CT Abdomen and Pelvis [DATE].

CLINICAL DATA: 74-year-old female with left hip pain after fall in
tub today.

EXAM:
DG HIP (WITH OR WITHOUT PELVIS) 2-3V LEFT

[Series 1: dg hip unilat w or w/o pelvis 2-3 views  · non-contrast · 0.14mm/px · 3 of 3 slices shown]
[im 1/3]
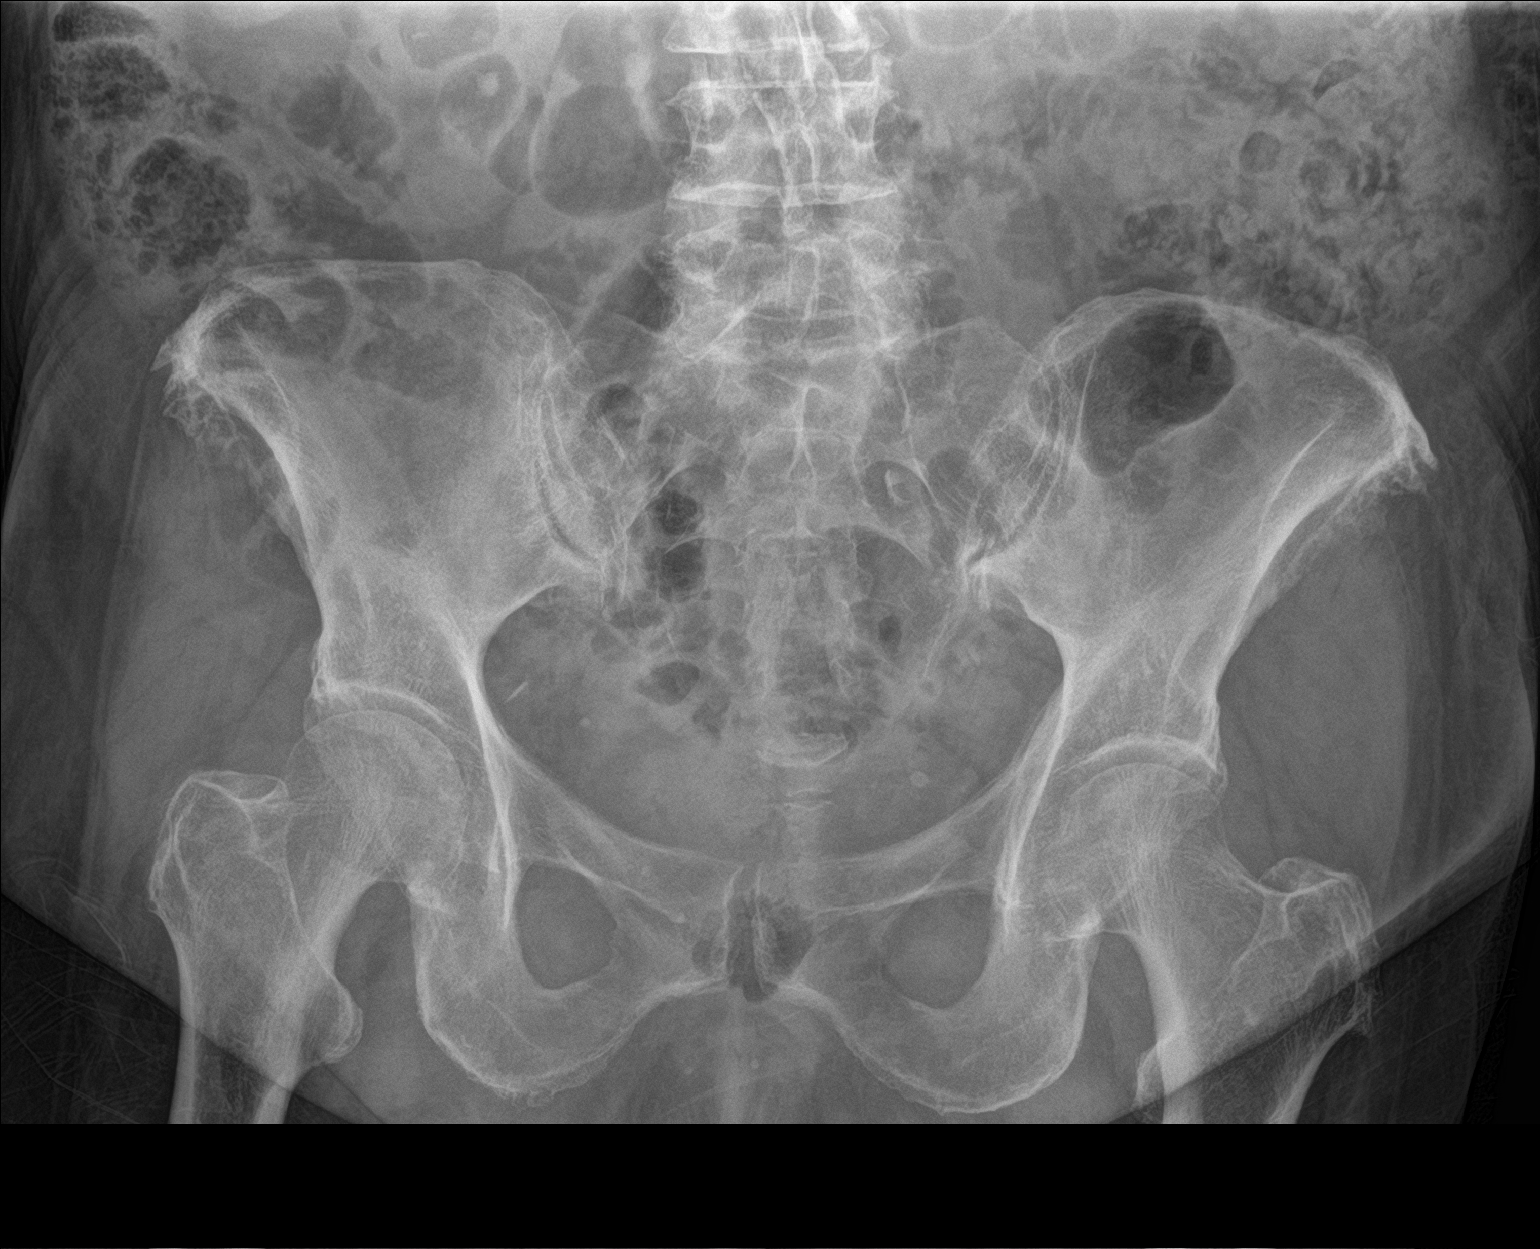
[im 2/3]
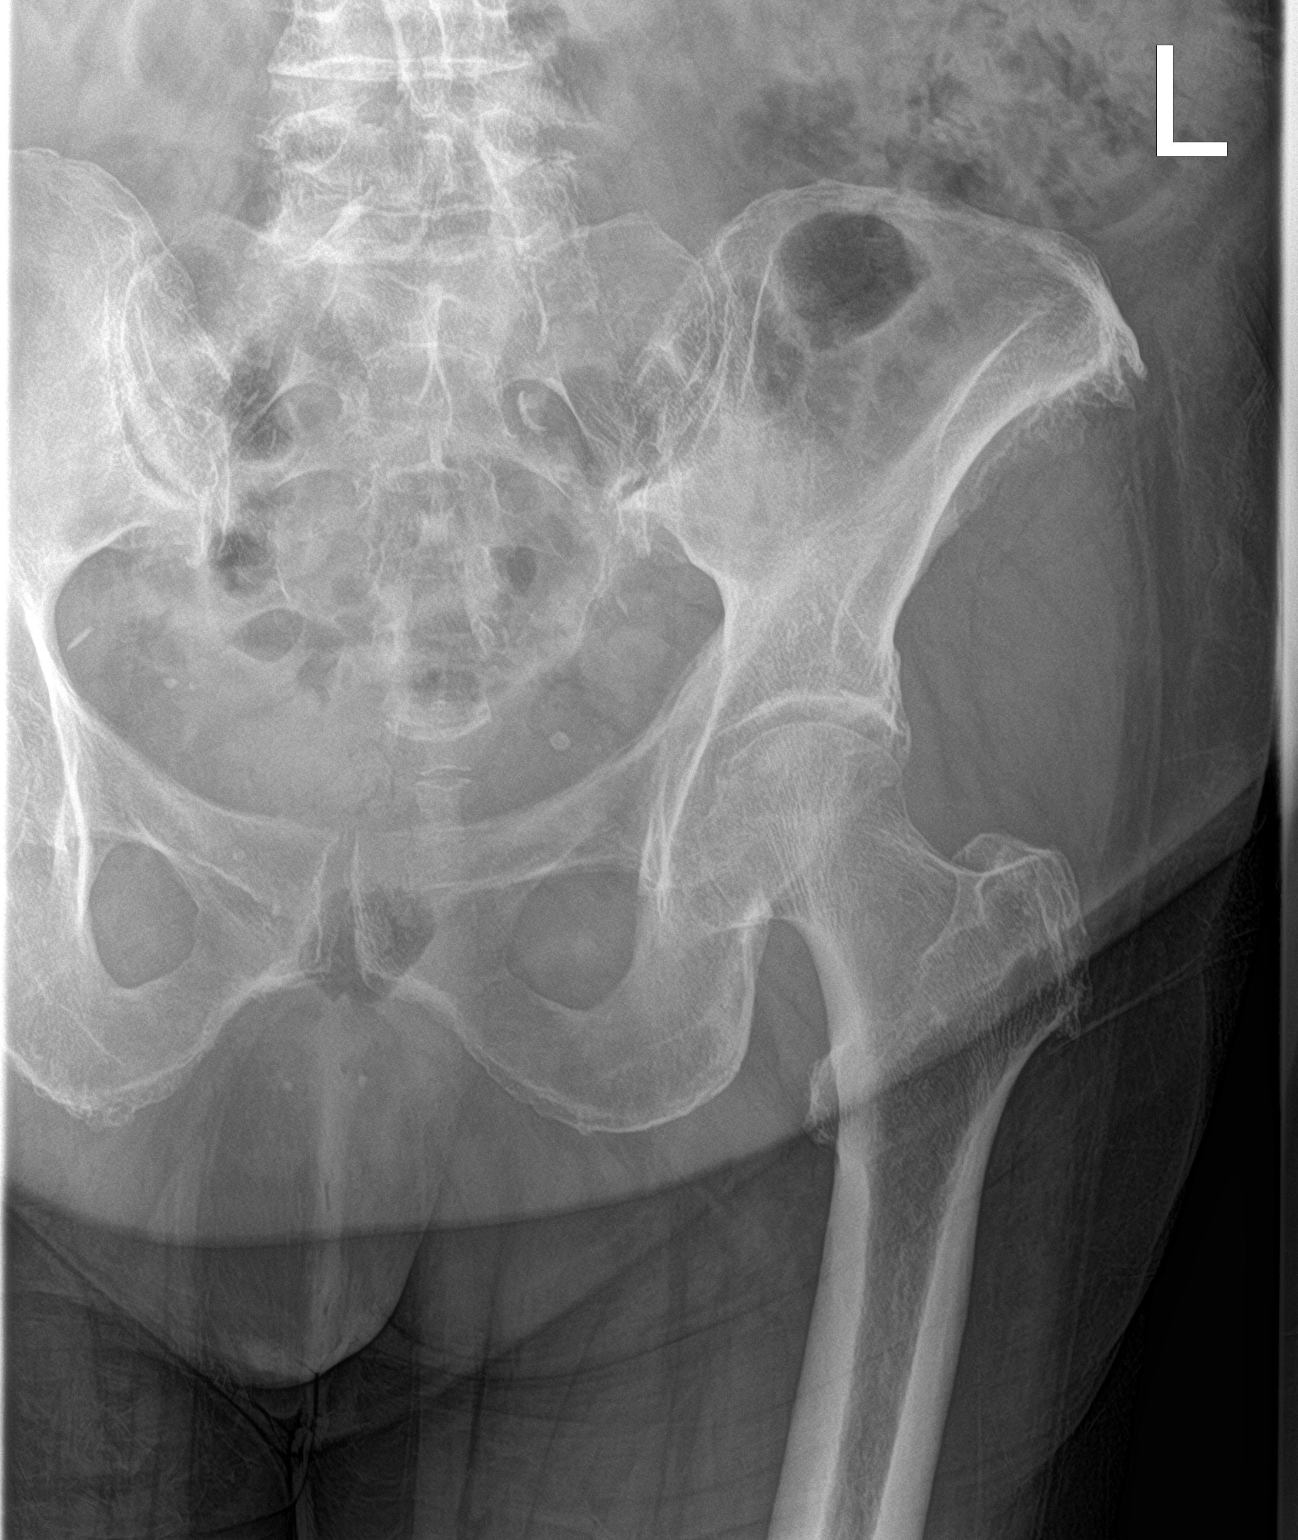
[im 3/3]
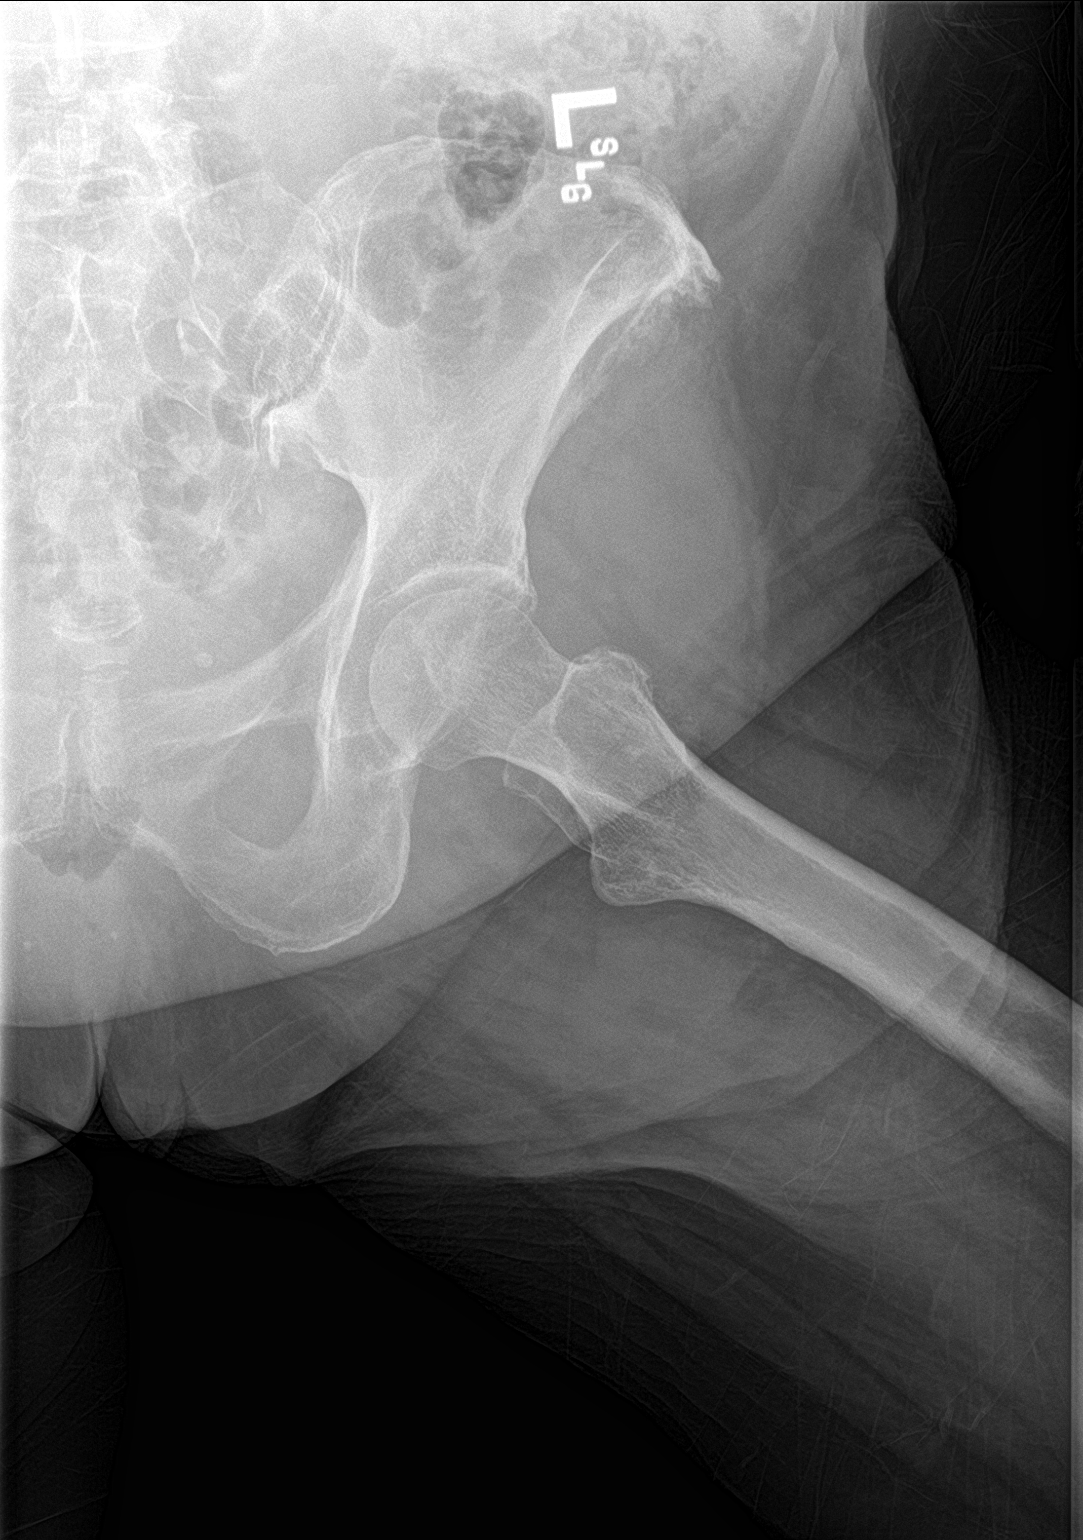

[3 of 3 positions shown; findings below may reference images not displayed]

FINDINGS: Femoral heads are normally located. Pelvis appears intact. SI joints
appear stable. Grossly intact proximal right femur. The proximal
left femur appears intact on AP and frogleg lateral views. No acute
osseous abnormality identified. Nonobstructed visible bowel gas
pattern. Small pelvic phleboliths. Aortoiliac calcified
atherosclerosis.
IMPRESSION: 1. No acute fracture or dislocation identified about the left hip or
pelvis.
2.  Aortic Atherosclerosis ([SI]-[SI]).

## 2019-09-01 IMAGING — CT CT HEAD W/O CM
3 series · 14 of 47 positions shown, 16 images · non-contrast
Comparison: Head CT [DATE]

CLINICAL DATA: Headache, posttraumatic. Polytrauma, critical,
head/cervical spine injury suspected. Additional history provided:
Reported fall yesterday.

EXAM:
CT HEAD WITHOUT CONTRAST
CT CERVICAL SPINE WITHOUT CONTRAST
TECHNIQUE: Multidetector CT imaging of the head and cervical spine was
performed following the standard protocol without intravenous
contrast. Multiplanar CT image reconstructions of the cervical spine
were also generated.

[Series 2: head wo · axial · 0.41mm/px · z∈[-125,+0]mm · 8 of 30 slices shown, 10 images]
[im 3/30  brain]
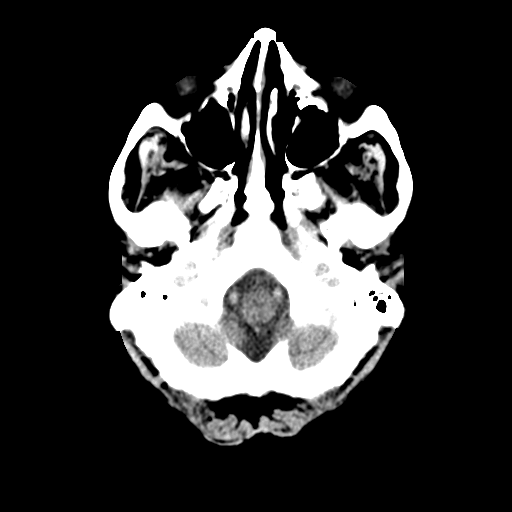
[im 3/30  bone]
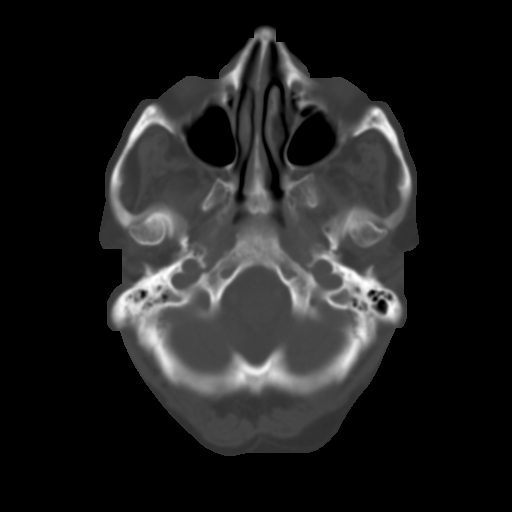
[im 7/30  brain]
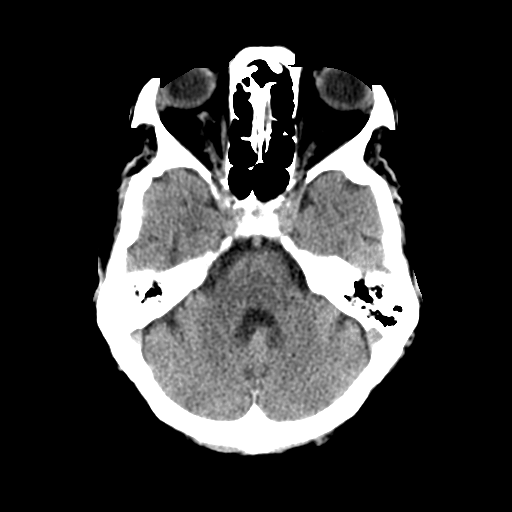
[im 10/30  brain]
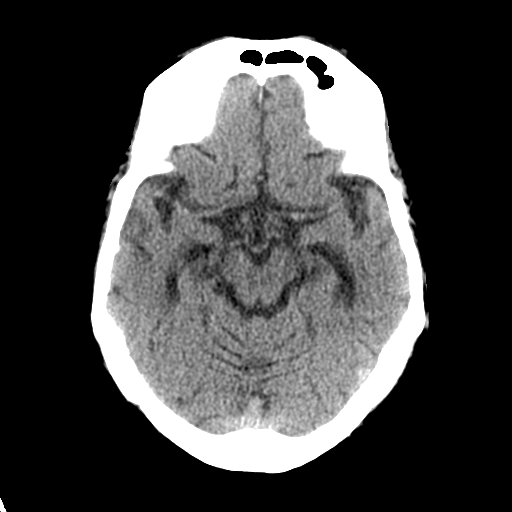
[im 14/30  brain]
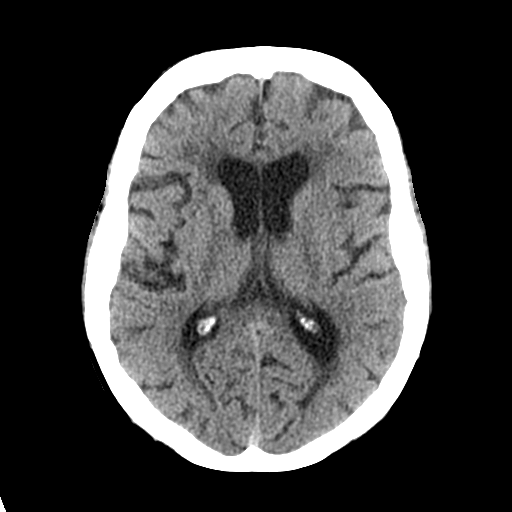
[im 17/30  brain]
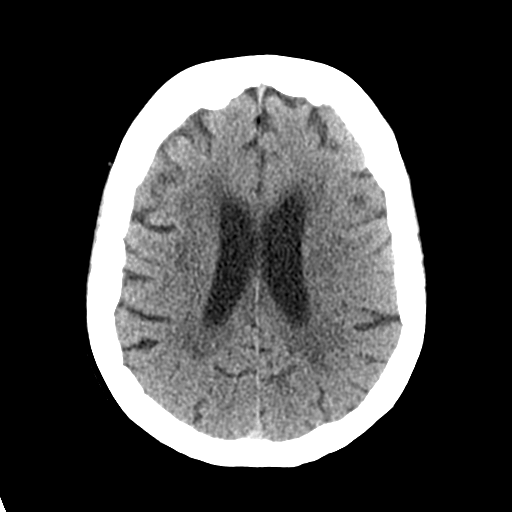
[im 17/30  bone]
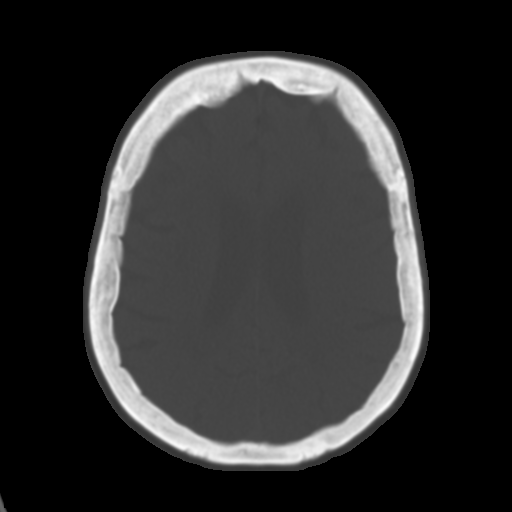
[im 21/30  brain]
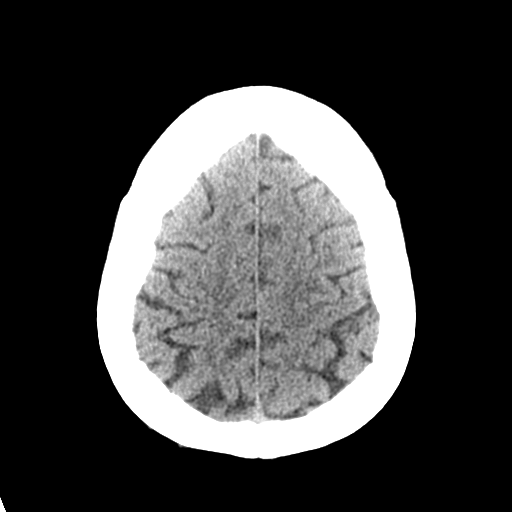
[im 24/30  brain]
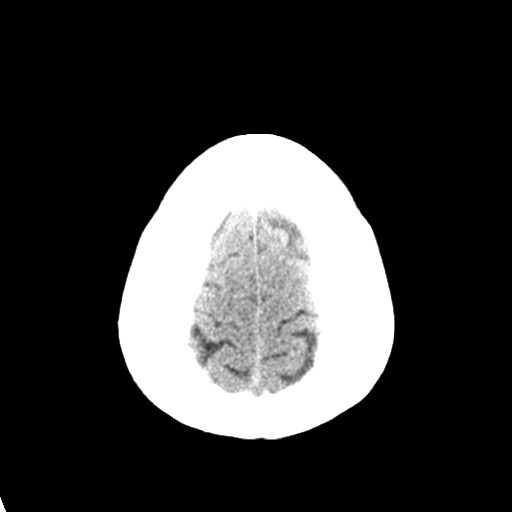
[im 28/30  brain]
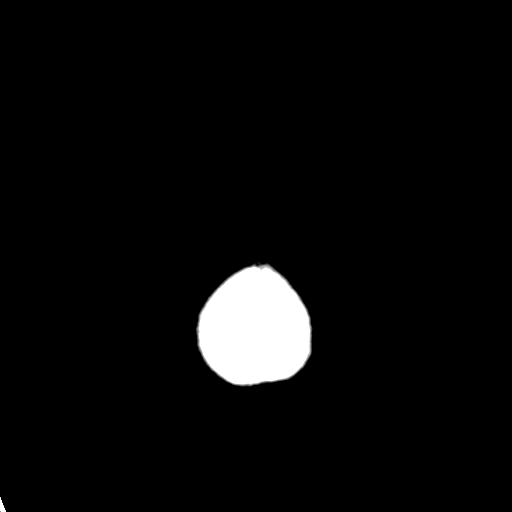

[Series 4: coronal soft tissue · coronal · 0.28mm/px · 3 of 60 slices shown]
[im 20/60  brain]
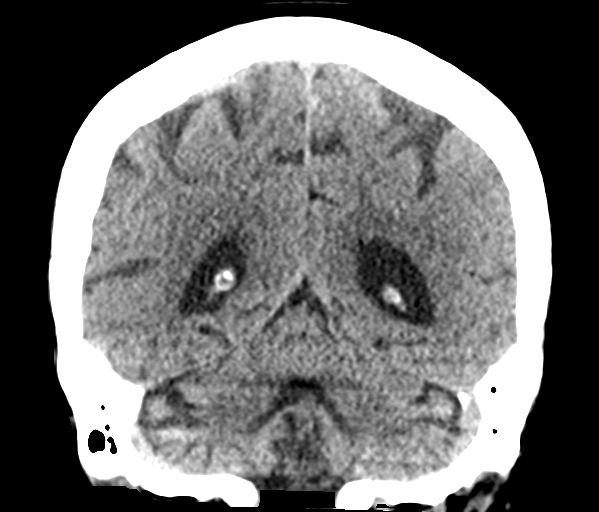
[im 27/60  brain]
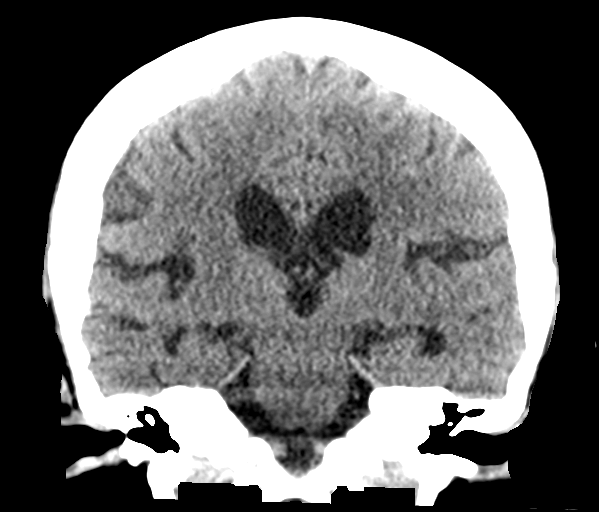
[im 33/60  brain]
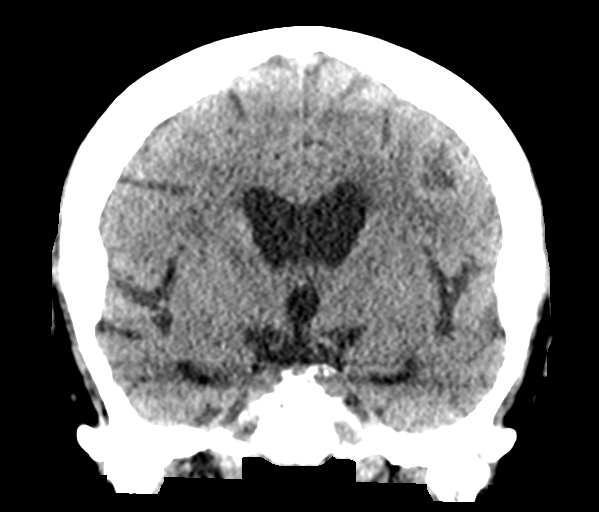

[Series 5: sagittal soft tissue · sagittal · 0.28mm/px · 3 of 50 slices shown]
[im 17/50  brain]
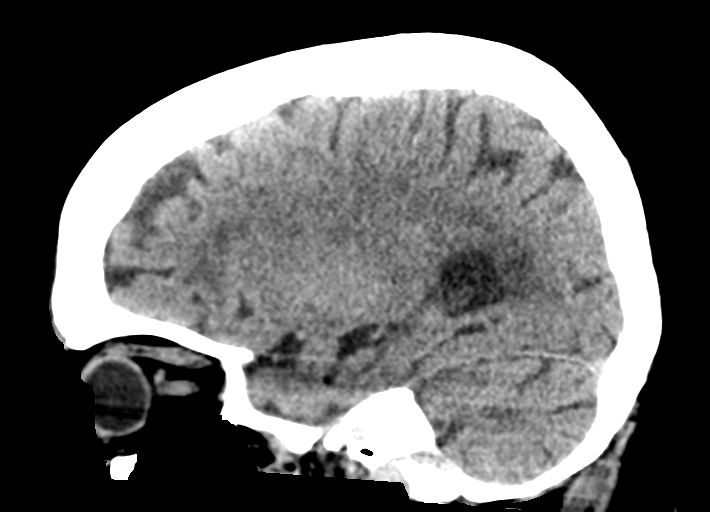
[im 25/50  brain]
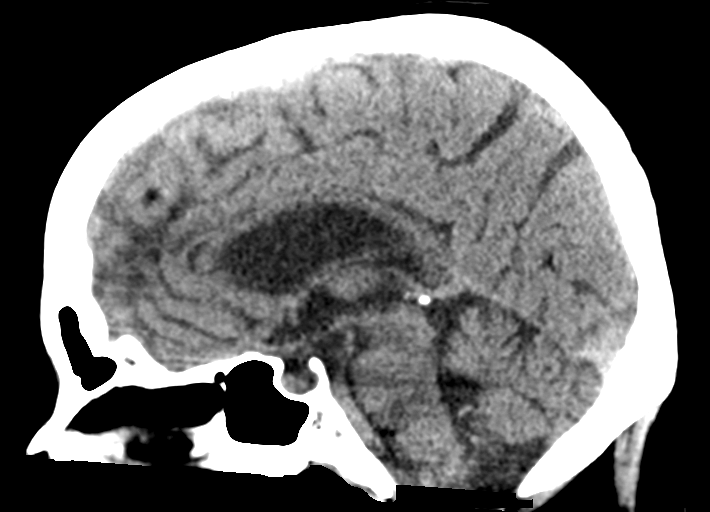
[im 33/50  brain]
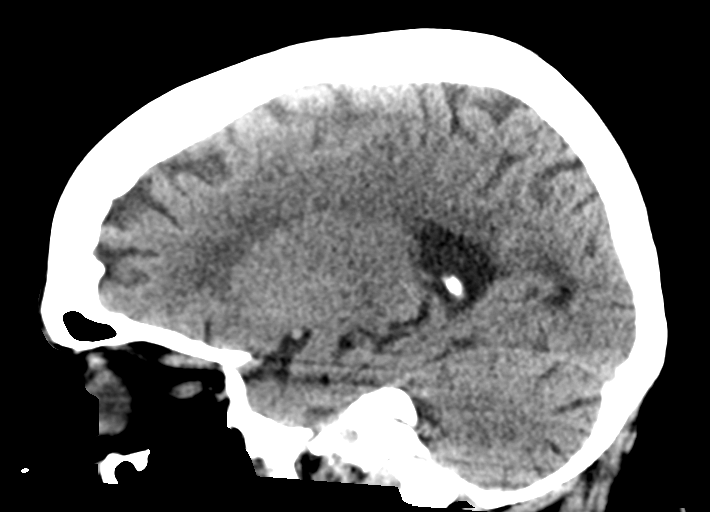

[14 of 47 positions shown; findings below may reference images not displayed]

FINDINGS: CT HEAD FINDINGS

Brain: There is no evidence of acute intracranial hemorrhage,
intracranial mass, midline shift or extra-axial fluid collection.No
demarcated cortical infarction. Unchanged mild ill-defined
hypoattenuation within the cerebral white matter is nonspecific, but
consistent with chronic small vessel ischemic disease. Stable, mild
generalized parenchymal atrophy.

Vascular: No hyperdense vessel.  Atherosclerotic calcifications.

Skull: Normal. Negative for fracture or focal lesion.

Sinuses/Orbits: Visualized orbits demonstrate no acute abnormality.
No significant paranasal sinus disease or mastoid effusion at the
imaged levels.

CT CERVICAL SPINE FINDINGS

Alignment: Straightening of the expected cervical lordosis. No
significant spondylolisthesis.

Skull base and vertebrae: The basion-dental and atlanto-dental
intervals are maintained.No evidence of acute fracture to the
cervical spine.

Soft tissues and spinal canal: No prevertebral fluid or swelling. No
visible canal hematoma.

Disc levels: No significant bony spinal canal or neural foraminal
narrowing at any level.

Upper chest: No consolidation within the imaged lung apices. No
visible pneumothorax.
IMPRESSION: CT head:

1. No evidence of acute intracranial abnormality.
2. Stable mild generalized parenchymal atrophy and chronic small
vessel ischemic disease.

CT cervical spine:

No evidence of acute fracture to the cervical spine.

## 2019-09-01 IMAGING — CR DG CHEST 2V
2 series · 2 of 2 positions shown · non-contrast
Comparison: [DATE]

CLINICAL DATA: Fall 1 day ago

EXAM:
CHEST - 2 VIEW

[chest lat]
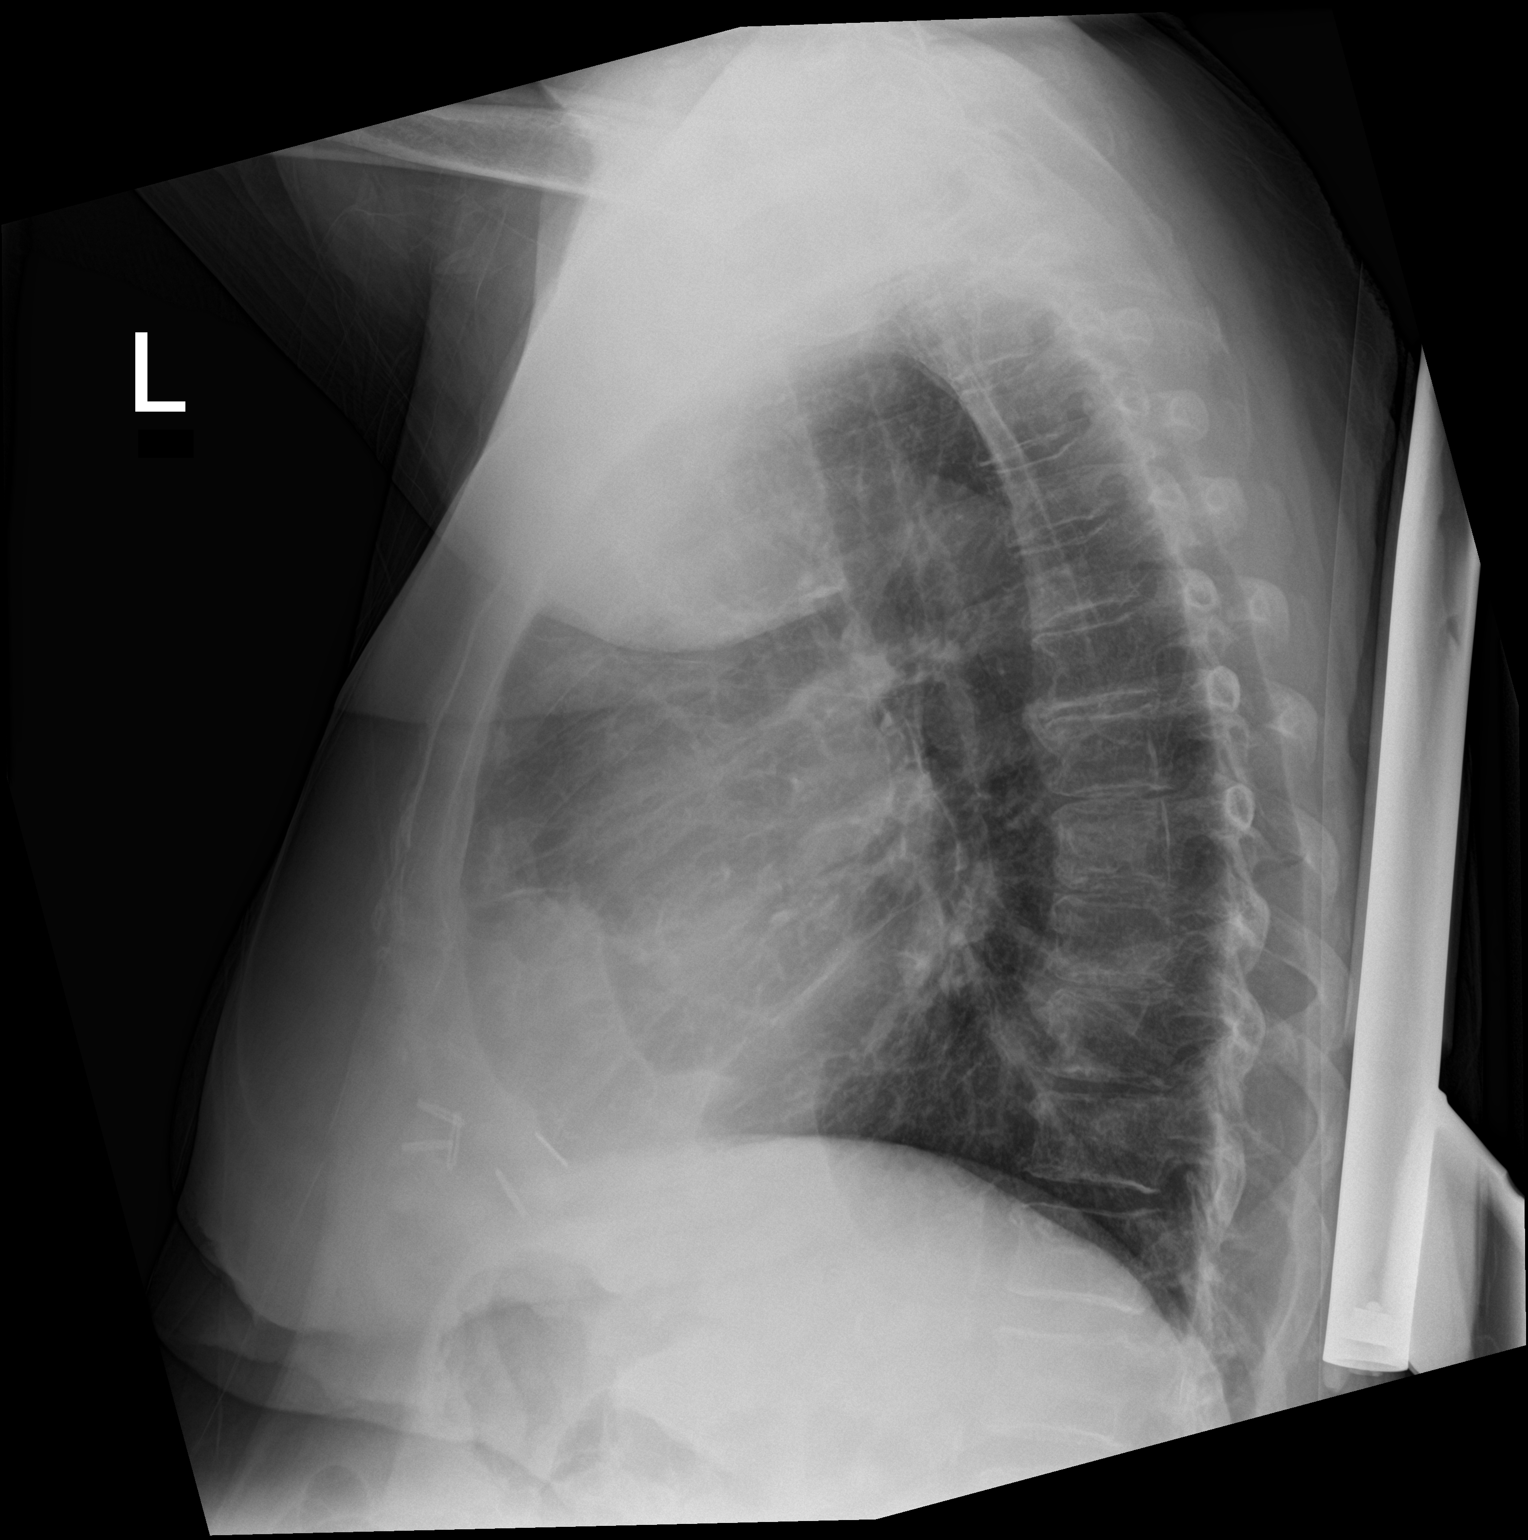

[chest ap]
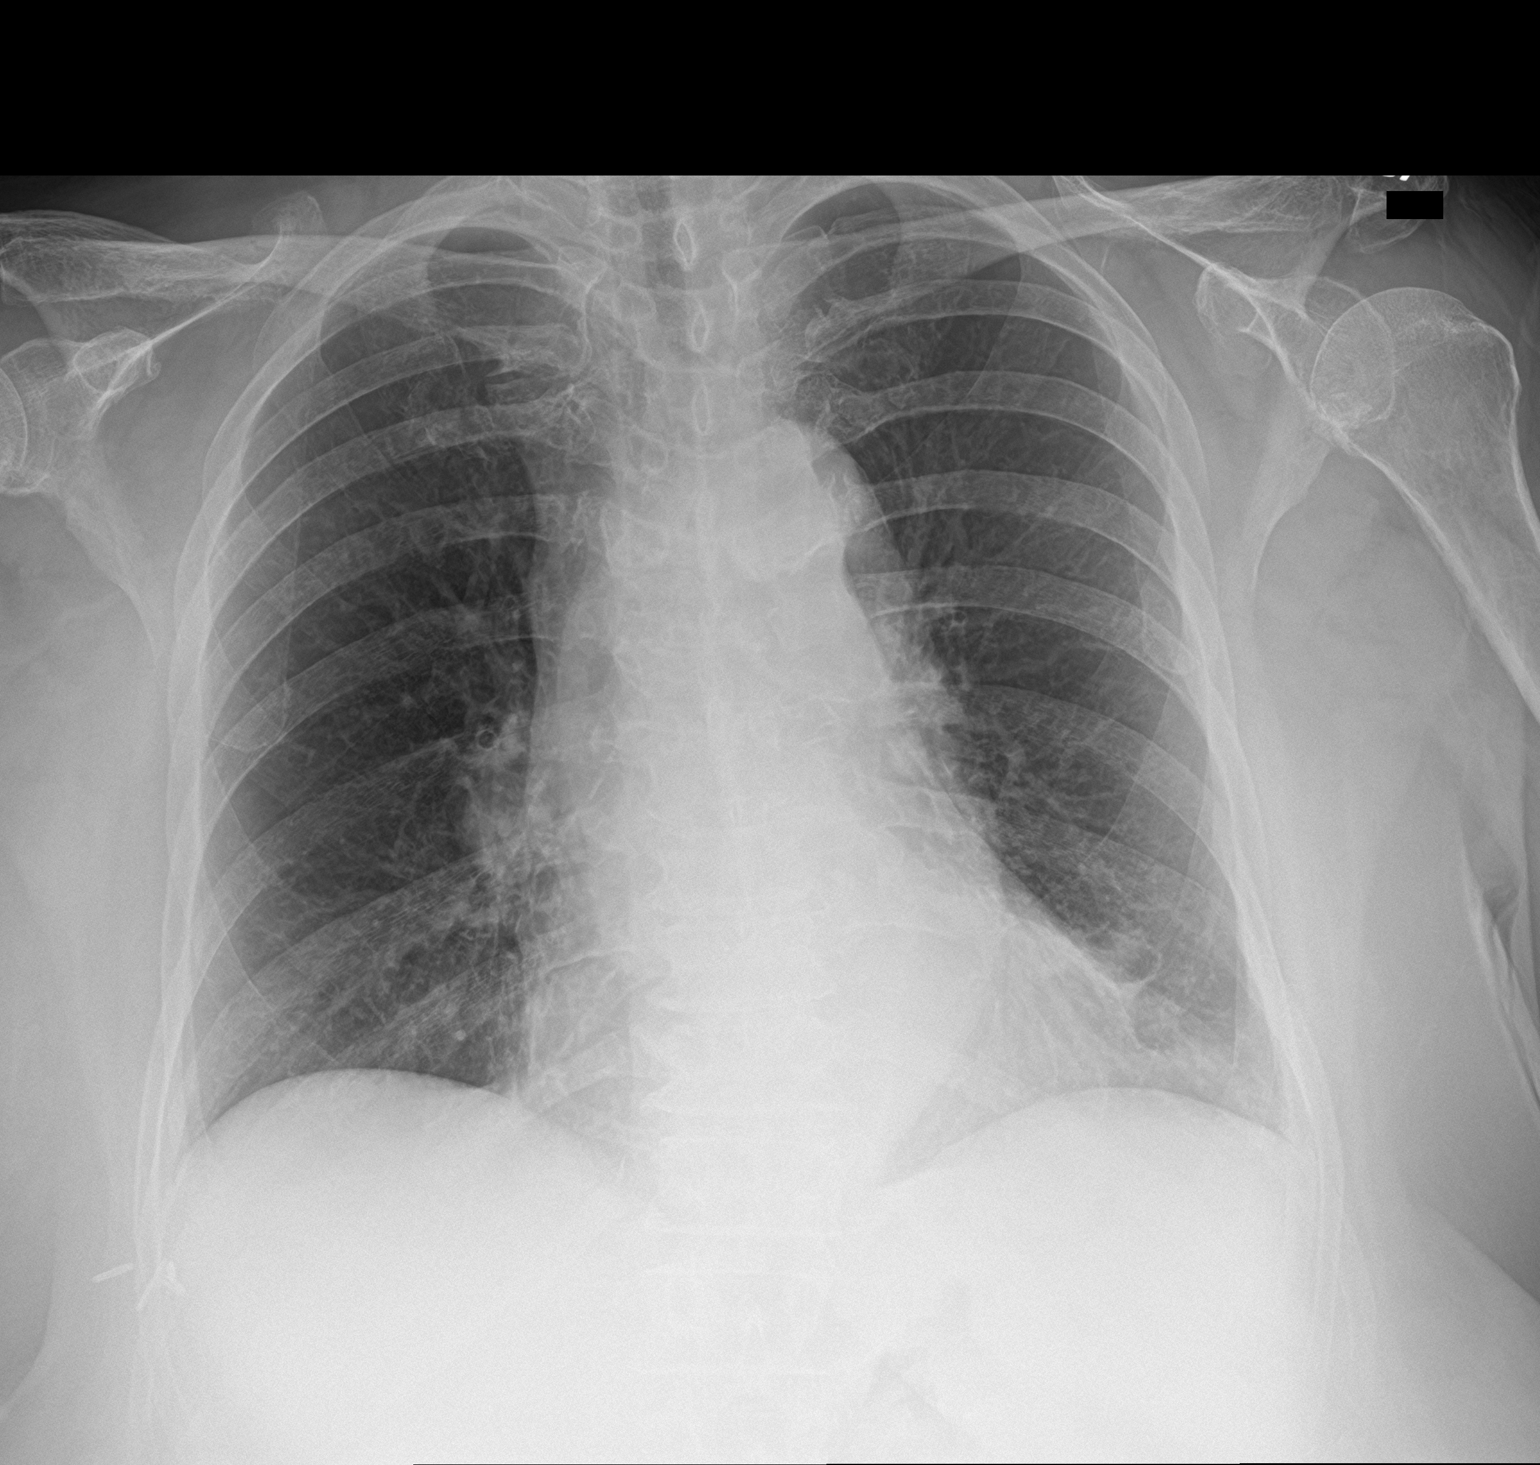

[2 of 2 positions shown; findings below may reference images not displayed]

FINDINGS: The heart size and mediastinal contours are within normal limits.
Atherosclerotic calcification of the aortic knob. Minimal linear
markings in the left lung base, likely atelectasis. Both lungs are
otherwise clear. The visualized skeletal structures are
unremarkable.
IMPRESSION: Minimal left basilar atelectasis.  Lungs otherwise clear.

## 2019-09-01 NOTE — Clinical Social Work Note (Signed)
Transition of Care [facility]) - Emergency Department Mini Assessment   Patient Details  Name: [name] MRN: [id number] Date of Birth: June 12, 1945  Transition of Care [facility]) CM/SW Contact:    Sherie Don, LCSW Phone Number:  09/01/2019, 8:37 PM  Clinical Narrative: CSW received consult for HH/DME following patient presenting to the ED after falling in her shower at home. CSW made multiple attempts to call patient on her cell phone, but was unable to reach her so a voicemail was left. CSW also attempted to speak with patient's brother, but he requested that he not be called regarding patient's care. CSW informed patient is currently active with [facility]. [facility] aware of patient's ED visit and will resume care. TOC signing off.  ED Mini Assessment: What brought you to the Emergency Department? : Fall  Barriers to Discharge: Barriers Resolved  Barrier interventions: [place] referral being made  Means of departure: Car  Interventions which prevented an admission or readmission: [place] or Services  Patient Contact and Communications    Admission diagnosis:  ems lobby/fall Patient Active Problem List   Diagnosis Date Noted  . MDD (major depressive disorder) 08/10/2019  . MDD (major depressive disorder), recurrent episode, severe ([place]) 06/24/2019  . Pain due to onychomycosis of toenails of both feet 11/23/2018  . Bipolar 1 disorder, manic, moderate ([place]) 07/21/2018  . Bipolar 1 disorder, mixed, severe ([place]) 07/21/2018  . Bipolar 1 disorder with moderate mania ([place]) 07/20/2018  . Severe bipolar I disorder, current or most recent episode depressed ([place]) 07/07/2017  . PTSD (post-traumatic stress disorder) 07/07/2017  . 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 Diabetes ([place]) 09/23/2014  . Essential hypertension 09/21/2014  . Hypothyroidism 09/21/2014  . Dyslipidemia 09/21/2014  . Bipolar I disorder, current or most recent episode  manic, severe with mixed features ([place]) 09/20/2014   PCP:  Casilda Carls, MD Pharmacy:   [facility] [address], [place] - [place] [address] Phone: [phone] Fax: [place], [place] - [place] [address] Phone: [phone] Fax: [phone]

## 2019-09-01 NOTE — ED Triage Notes (Signed)
Pt comes after two falls. Pt fell last night and EMS helped her back into bed. Today pt was drying her feet off and fell backwards into the tub. Pt c/o left side pain all over and has a lac to the left elbow. Bleeding controlled at this time.

## 2019-09-01 NOTE — ED Triage Notes (Signed)
Arrives via [facility].  Patient had a fall yesterday and today slipped and fell in the bathroom.  No c/o pain from fall today.  Denies dizziness or injury.  Just wanting to be evaluated in ED for yesterday's fall.-- C/O left breast pain and occasional back, shoulder, and neck pain.  VS wnl.  CBG:  152

## 2019-09-01 NOTE — ED Notes (Signed)
Pt called from lobby with no answer.

## 2019-09-01 NOTE — Discharge Instructions (Addendum)
I have asked social work and case management to call you to help facilitate better PT and OT at home.  Keep a dry, clean dressing on your wound.

## 2019-09-01 NOTE — ED Provider Notes (Signed)
[facility] Emergency Department Provider Note  ____________________________________________   First MD Initiated Contact with Patient 09/01/19 1632     (approximate)  I have reviewed the triage vital signs and the nursing notes.   HISTORY  Chief Complaint Fall    HPI Telesha Dorsey Coby is a 74 y.o. female  With extensive past medical history as below including bipolar disorder, recurrent falls, hypertension, hyperlipidemia, here with falls.  The patient states that yesterday, while getting ready, she tripped and fell to the left.  She struck her left arm.  She does not believe she hit her head.  She has a history of "leaning to the left" for the last several months, and states that she has had recurrent falls due to this.  Review of ED records confirms this.  She states she was getting ready to go to the doctor today, when she bent forward to fix her socks, causing her to also fall forward.  She has had recurrent pain in her left arm and left chest since then.  No shortness of breath.  She does not like she had her head or lost consciousness, though she did fall backwards into the shower today.  She is not on blood thinners.  She otherwise denies complaints.      Past Medical History:  Diagnosis Date  . Arthritis   . Breast cancer ([place])   . Cancer [facility]) 2004   right breast ca  . Diabetes mellitus without complication ([place])   . Hypercholesterolemia   . Hypertension   . Hypothyroid   . Seasonal allergies     Patient Active Problem List   Diagnosis Date Noted  . MDD (major depressive disorder) 08/10/2019  . MDD (major depressive disorder), recurrent episode, severe ([place]) 06/24/2019  . Pain due to onychomycosis of toenails of both feet 11/23/2018  . Bipolar 1 disorder, manic, moderate ([place]) 07/21/2018  . Bipolar 1 disorder, mixed, severe ([place]) 07/21/2018  . Bipolar 1 disorder with moderate mania ([place]) 07/20/2018  . Severe bipolar I disorder,  current or most recent episode depressed ([place]) 07/07/2017  . PTSD (post-traumatic stress disorder) 07/07/2017  . 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 Diabetes ([place]) 09/23/2014  . Essential hypertension 09/21/2014  . Hypothyroidism 09/21/2014  . Dyslipidemia 09/21/2014  . Bipolar I disorder, current or most recent episode manic, severe with mixed features ([place]) 09/20/2014    Past Surgical History:  Procedure Laterality Date  . ABDOMINAL HYSTERECTOMY    . BREAST BIOPSY Left    neg  . BREAST EXCISIONAL BIOPSY Right 2004   breast ca and lumpectomy  . BREAST LUMPECTOMY Right 2004   breast ca  . CHOLECYSTECTOMY    . COLONOSCOPY WITH PROPOFOL N/A 02/13/2017   Procedure: COLONOSCOPY WITH PROPOFOL;  Surgeon: Jonathon Bellows, MD;  Location: [facility] ENDOSCOPY;  Service: Gastroenterology;  Laterality: N/A;  . KIDNEY STONE SURGERY      Prior to Admission medications   Medication Sig Start Date End Date Taking? Authorizing Provider  clonazePAM (KLONOPIN) 1 MG tablet Take 1 tablet (1 mg total) by mouth at bedtime. 08/13/19   Money, Lowry Ram, FNP  divalproex (DEPAKOTE ER) 500 MG 24 hr tablet Take 2 tablets (1,000 mg total) by mouth at bedtime. 08/13/19   Money, Lowry Ram, FNP  enalapril (VASOTEC) 5 MG tablet Take 1 tablet (5 mg total) by mouth 2 (two) times daily. 08/13/19   Money, Lowry Ram, FNP  FLUoxetine (PROZAC) 40 MG capsule  Take 1 capsule (40 mg total) by mouth daily. 08/13/19   Money, Lowry Ram, FNP  glipiZIDE (GLUCOTROL XL) 5 MG 24 hr tablet Take 1 tablet (5 mg total) by mouth 2 (two) times daily. 08/13/19   Money, Lowry Ram, FNP  levothyroxine (SYNTHROID) 88 MCG tablet Take 1 tablet (88 mcg total) by mouth daily before breakfast. 08/13/19   Money, Lowry Ram, FNP  linagliptin (TRADJENTA) 5 MG TABS tablet Take 1 tablet (5 mg total) by mouth daily. 08/13/19   Money, Lowry Ram, FNP  loratadine (CLARITIN) 10 MG tablet Take 1 tablet (10 mg total) by mouth daily. 08/13/19    Money, Lowry Ram, FNP  lurasidone (LATUDA) 20 MG TABS tablet Take 1 tablet (20 mg total) by mouth daily with supper. 08/13/19   Money, Lowry Ram, FNP  metFORMIN (GLUCOPHAGE) 500 MG tablet Take 1 tablet (500 mg total) by mouth 2 (two) times daily with a meal. 08/13/19   Money, Lowry Ram, FNP  PAZEO 0.7 % SOLN Take 1 drop by mouth daily.  08/25/18   [provider]  rosuvastatin (CRESTOR) 40 MG tablet Take 1 tablet (40 mg total) by mouth daily. 08/13/19   Money, Lowry Ram, FNP  traZODone (DESYREL) 100 MG tablet Take 1 tablet (100 mg total) by mouth at bedtime. 08/13/19   Money, Lowry Ram, FNP    Allergies Navane [thiothixene] and Penicillins  Family History  Problem Relation Age of Onset  . Bladder Cancer Neg Hx   . Kidney cancer Neg Hx     Social History Social History   Tobacco Use  . Smoking status: Former Smoker    Types: Cigarettes  . Smokeless tobacco: Never Used  Substance Use Topics  . Alcohol use: No  . Drug use: No    Review of Systems  Review of Systems  Constitutional: Positive for fatigue. Negative for fever.  HENT: Negative for congestion and sore throat.   Eyes: Negative for visual disturbance.  Respiratory: Negative for cough and shortness of breath.   Cardiovascular: Negative for chest pain.  Gastrointestinal: Negative for abdominal pain, diarrhea, nausea and vomiting.  Genitourinary: Negative for flank pain.  Musculoskeletal: Positive for gait problem. Negative for back pain and neck pain.  Skin: Positive for wound. Negative for rash.  Neurological: Negative for weakness.  All other systems reviewed and are negative.    ____________________________________________  PHYSICAL EXAM:      VITAL SIGNS: ED Triage Vitals  Enc Vitals Group     BP 09/01/19 1331 (!) 138/99     Pulse Rate 09/01/19 1331 72     Resp 09/01/19 1331 18     Temp 09/01/19 1331 98.4 F (36.9 C)     Temp Source 09/01/19 1331 Oral     SpO2 09/01/19 1331 95 %     Weight 09/01/19 1332  176 lb 5.9 oz (80 kg)     Height 09/01/19 1332 5\' 1"  (1.549 m)     Head Circumference --      Peak Flow --      Pain Score 09/01/19 1332 6     Pain Loc --      Pain Edu? --      Excl. in [place]? --      Physical Exam Vitals and nursing note reviewed.  Constitutional:      General: She is not in acute distress.    Appearance: She is well-developed.  HENT:     Head: Normocephalic and atraumatic.  Eyes:  Conjunctiva/sclera: Conjunctivae normal.  Cardiovascular:     Rate and Rhythm: Normal rate and regular rhythm.     Heart sounds: Normal heart sounds. No murmur. No friction rub.     Comments: Mild tenderness to palpation of the left chest wall.  No bruising deformity.  No ecchymosis.  No open wounds.  Normal breathing. Pulmonary:     Effort: Pulmonary effort is normal. No respiratory distress.     Breath sounds: Normal breath sounds. No wheezing or rales.  Abdominal:     General: There is no distension.     Palpations: Abdomen is soft.     Tenderness: There is no abdominal tenderness.  Musculoskeletal:     Cervical back: Neck supple.  Skin:    General: Skin is warm.     Capillary Refill: Capillary refill takes less than 2 seconds.     Comments: Superficial skin tears to the left lateral forearm.  No exposed bone.  No bony tenderness or swelling.  Neurological:     Mental Status: She is alert and oriented to person, place, and time.     Motor: No abnormal muscle tone.       ____________________________________________   LABS (all labs ordered are listed, but only abnormal results are displayed)  Labs Reviewed  BASIC METABOLIC PANEL - Abnormal; Notable for the following components:      Result Value   Glucose, Bld 139 (*)    Creatinine, Ser 1.23 (*)    GFR calc non Af Amer 43 (*)    GFR calc Af Amer 50 (*)    All other components within normal limits  CBC  TROPONIN I (HIGH SENSITIVITY)  TROPONIN I (HIGH SENSITIVITY)     ____________________________________________  EKG: Normal sinus rhythm, ventricular rate 73.  PR 182, QRS 86, QTc 464.  Normal sinus rhythm, low voltage QRS.  No acute ST elations or depressions. ________________________________________  RADIOLOGY All imaging, including plain films, CT scans, and ultrasounds, independently reviewed by me, and interpretations confirmed via formal radiology reads.  ED MD interpretation:   Chest x-ray Negative, no apparent rib fracture  Official radiology report(s): DG Chest 2 View  Result Date: 09/01/2019 CLINICAL DATA:  Fall 1 day ago EXAM: CHEST - 2 VIEW COMPARISON:  07/05/2019 FINDINGS: The heart size and mediastinal contours are within normal limits. Atherosclerotic calcification of the aortic knob. Minimal linear markings in the left lung base, likely atelectasis. Both lungs are otherwise clear. The visualized skeletal structures are unremarkable. IMPRESSION: Minimal left basilar atelectasis.  Lungs otherwise clear. Electronically Signed   By: Davina Poke D.O.   On: 09/01/2019 14:24   CT Head Wo Contrast  Result Date: 09/01/2019 CLINICAL DATA:  Headache, posttraumatic. Polytrauma, critical, head/cervical spine injury suspected. Additional history provided: Reported fall yesterday. EXAM: CT HEAD WITHOUT CONTRAST CT CERVICAL SPINE WITHOUT CONTRAST TECHNIQUE: Multidetector CT imaging of the head and cervical spine was performed following the standard protocol without intravenous contrast. Multiplanar CT image reconstructions of the cervical spine were also generated. COMPARISON:  Head CT 07/07/2018 FINDINGS: CT HEAD FINDINGS Brain: There is no evidence of acute intracranial hemorrhage, intracranial mass, midline shift or extra-axial fluid collection.No demarcated cortical infarction. Unchanged mild ill-defined hypoattenuation within the cerebral white matter is nonspecific, but consistent with chronic small vessel ischemic disease. Stable, mild  generalized parenchymal atrophy. Vascular: No hyperdense vessel.  Atherosclerotic calcifications. Skull: Normal. Negative for fracture or focal lesion. Sinuses/Orbits: Visualized orbits demonstrate no acute abnormality. No significant paranasal sinus disease or mastoid effusion  at the imaged levels. CT CERVICAL SPINE FINDINGS Alignment: Straightening of the expected cervical lordosis. No significant spondylolisthesis. Skull base and vertebrae: The basion-dental and atlanto-dental intervals are maintained.No evidence of acute fracture to the cervical spine. Soft tissues and spinal canal: No prevertebral fluid or swelling. No visible canal hematoma. Disc levels: No significant bony spinal canal or neural foraminal narrowing at any level. Upper chest: No consolidation within the imaged lung apices. No visible pneumothorax. IMPRESSION: CT head: 1. No evidence of acute intracranial abnormality. 2. Stable mild generalized parenchymal atrophy and chronic small vessel ischemic disease. CT cervical spine: No evidence of acute fracture to the cervical spine. Electronically Signed   By: Kellie Simmering DO   On: 09/01/2019 18:25   CT Cervical Spine Wo Contrast  Result Date: 09/01/2019 CLINICAL DATA:  Headache, posttraumatic. Polytrauma, critical, head/cervical spine injury suspected. Additional history provided: Reported fall yesterday. EXAM: CT HEAD WITHOUT CONTRAST CT CERVICAL SPINE WITHOUT CONTRAST TECHNIQUE: Multidetector CT imaging of the head and cervical spine was performed following the standard protocol without intravenous contrast. Multiplanar CT image reconstructions of the cervical spine were also generated. COMPARISON:  Head CT 07/07/2018 FINDINGS: CT HEAD FINDINGS Brain: There is no evidence of acute intracranial hemorrhage, intracranial mass, midline shift or extra-axial fluid collection.No demarcated cortical infarction. Unchanged mild ill-defined hypoattenuation within the cerebral white matter is nonspecific,  but consistent with chronic small vessel ischemic disease. Stable, mild generalized parenchymal atrophy. Vascular: No hyperdense vessel.  Atherosclerotic calcifications. Skull: Normal. Negative for fracture or focal lesion. Sinuses/Orbits: Visualized orbits demonstrate no acute abnormality. No significant paranasal sinus disease or mastoid effusion at the imaged levels. CT CERVICAL SPINE FINDINGS Alignment: Straightening of the expected cervical lordosis. No significant spondylolisthesis. Skull base and vertebrae: The basion-dental and atlanto-dental intervals are maintained.No evidence of acute fracture to the cervical spine. Soft tissues and spinal canal: No prevertebral fluid or swelling. No visible canal hematoma. Disc levels: No significant bony spinal canal or neural foraminal narrowing at any level. Upper chest: No consolidation within the imaged lung apices. No visible pneumothorax. IMPRESSION: CT head: 1. No evidence of acute intracranial abnormality. 2. Stable mild generalized parenchymal atrophy and chronic small vessel ischemic disease. CT cervical spine: No evidence of acute fracture to the cervical spine. Electronically Signed   By: Kellie Simmering DO   On: 09/01/2019 18:25   DG Hip Unilat W or Wo Pelvis 2-3 Views Left  Result Date: 09/01/2019 CLINICAL DATA:  74 year old female with left hip pain after fall in tub today. EXAM: DG HIP (WITH OR WITHOUT PELVIS) 2-3V LEFT COMPARISON:  CT Abdomen and Pelvis 04/11/2016. FINDINGS: Femoral heads are normally located. Pelvis appears intact. SI joints appear stable. Grossly intact proximal right femur. The proximal left femur appears intact on AP and frogleg lateral views. No acute osseous abnormality identified. Nonobstructed visible bowel gas pattern. Small pelvic phleboliths. Aortoiliac calcified atherosclerosis. IMPRESSION: 1. No acute fracture or dislocation identified about the left hip or pelvis. 2.  Aortic Atherosclerosis (ICD10-I70.0). Electronically  Signed   By: Genevie Ann M.D.   On: 09/01/2019 18:44    ____________________________________________  PROCEDURES   Procedure(s) performed (including Critical Care):  Procedures  ____________________________________________  INITIAL IMPRESSION / MDM / [place] / ED COURSE  As part of my medical decision making, I reviewed the following data within the [place] notes reviewed and incorporated, Old chart reviewed, Notes from prior ED visits, and [place] Controlled Substance Database       *  Jameeka Deems was evaluated in Emergency Department on 09/01/2019 for the symptoms described in the history of present illness. She was evaluated in the context of the global COVID-19 pandemic, which necessitated consideration that the patient might be at risk for infection with the SARS-CoV-2 virus that causes COVID-19. Institutional protocols and algorithms that pertain to the evaluation of patients at risk for COVID-19 are in a state of rapid change based on information released by regulatory bodies including the CDC and federal and state organizations. These policies and algorithms were followed during the patient's care in the ED.  Some ED evaluations and interventions may be delayed as a result of limited staffing during the pandemic.*     Medical Decision Making:  74 yo F with h/o recurrent falls here with likely MSk pain after recurrent fall. CT and XR imaging shows no acute fracture or abnormality. Screening labs, including trop and EKG, are normal. She has a superficial skin tear to L elbow which was dressed. No apparent acute medical emergency. I had a long discussion with pt re: her recurrent falls, living situation. She was just discharged from a psych admission with PT/OT evaluations. She denies any SI, HI, or AVH. Discussed my concerns for her recurrent falls, but this seems to be a chronic issue. She would like to return home which is reasonable. Will have  SW/CM reach out to determine and re-establish home health PT and OT with RN evaluation, and d/c home.  ____________________________________________  FINAL CLINICAL IMPRESSION(S) / ED DIAGNOSES  Final diagnoses:  Fall, initial encounter  Skin tear of left elbow without complication, initial encounter     MEDICATIONS GIVEN DURING THIS VISIT:  Medications - No data to display   ED Discharge Orders    None       Note:  This document was prepared using Dragon voice recognition software and may include unintentional dictation errors.   Duffy Bruce, MD 09/01/19 [id number]

## 2019-09-09 ENCOUNTER — Ambulatory Visit: Payer: Medicare HMO | Admitting: Podiatry

## 2019-09-16 ENCOUNTER — Other Ambulatory Visit: Payer: Self-pay

## 2019-09-16 ENCOUNTER — Ambulatory Visit (INDEPENDENT_AMBULATORY_CARE_PROVIDER_SITE_OTHER): Payer: Medicare HMO | Admitting: Podiatry

## 2019-09-16 ENCOUNTER — Encounter: Payer: Self-pay | Admitting: Podiatry

## 2019-09-16 VITALS — Temp 98.0°F

## 2019-09-16 DIAGNOSIS — E119 Type 2 diabetes mellitus without complications: Secondary | ICD-10-CM

## 2019-09-16 DIAGNOSIS — M79674 Pain in right toe(s): Secondary | ICD-10-CM

## 2019-09-16 DIAGNOSIS — M79675 Pain in left toe(s): Secondary | ICD-10-CM | POA: Diagnosis not present

## 2019-09-16 DIAGNOSIS — B351 Tinea unguium: Secondary | ICD-10-CM | POA: Diagnosis not present

## 2019-09-16 NOTE — Progress Notes (Signed)
This patient returns to my office for at risk foot care.  This patient requires this care by a professional since this patient will be at risk due to having diabetes.  This patient is unable to cut nails herself since the patient cannot reach her nails.These nails are painful walking and wearing shoes.  This patient presents for at risk foot care today.  General Appearance  Alert, conversant and in no acute stress.  Vascular  Dorsalis pedis and posterior tibial  pulses are palpable  bilaterally.  Capillary return is within normal limits  bilaterally. Temperature is within normal limits  bilaterally.  Neurologic  Senn-Weinstein monofilament wire test within normal limits  bilaterally. Muscle power within normal limits bilaterally.  Nails Thick disfigured discolored nails with subungual debris  from hallux to fifth toes bilaterally. No evidence of bacterial infection or drainage bilaterally.  Orthopedic  No limitations of motion  feet .  No crepitus or effusions noted.  No bony pathology or digital deformities noted.  HAV  B/L.  Skin  normotropic skin with no porokeratosis noted bilaterally.  No signs of infections or ulcers noted.     Onychomycosis  Pain in right toes  Pain in left toes  Consent was obtained for treatment procedures.   Mechanical debridement of nails 1-5  bilaterally performed with a nail nipper.  Filed with dremel without incident.    Return office visit  4 months                    Told patient to return for periodic foot care and evaluation due to potential at risk complications.   Gardiner Barefoot DPM

## 2019-10-11 ENCOUNTER — Emergency Department
Admission: EM | Admit: 2019-10-11 | Discharge: 2019-10-11 | Discharge status: Against Medical Advice | Payer: Medicare HMO

## 2019-10-11 NOTE — ED Triage Notes (Signed)
During Triage interview, pt reports her CBGs are "not as high as I had thought. I don't feel bad but was worried about going into a diabetic coma, so came in by EMS. I don't think I need to be seen so I'll just call a cab and go home". Pt encouraged to stay and be seen by the EDP, but pt just states she will decide once she is able to determine if the taxi service is available at this time of night.

## 2019-11-10 ENCOUNTER — Ambulatory Visit
Admission: RE | Admit: 2019-11-10 | Discharge: 2019-11-10 | Disposition: A | Discharge status: Home / Self Care | Payer: Medicare HMO | Source: Ambulatory Visit | Attending: Internal Medicine | Admitting: Internal Medicine

## 2019-11-10 DIAGNOSIS — Z1231 Encounter for screening mammogram for malignant neoplasm of breast: Secondary | ICD-10-CM

## 2019-11-10 IMAGING — MG DIGITAL SCREENING BILAT W/ TOMO W/ CAD
8 series · 8 of 24 positions shown · non-contrast
Comparison: Previous exam(s).

CLINICAL DATA: Screening.

EXAM:
DIGITAL SCREENING BILATERAL MAMMOGRAM WITH TOMO AND CAD

[L CC synth-2D]
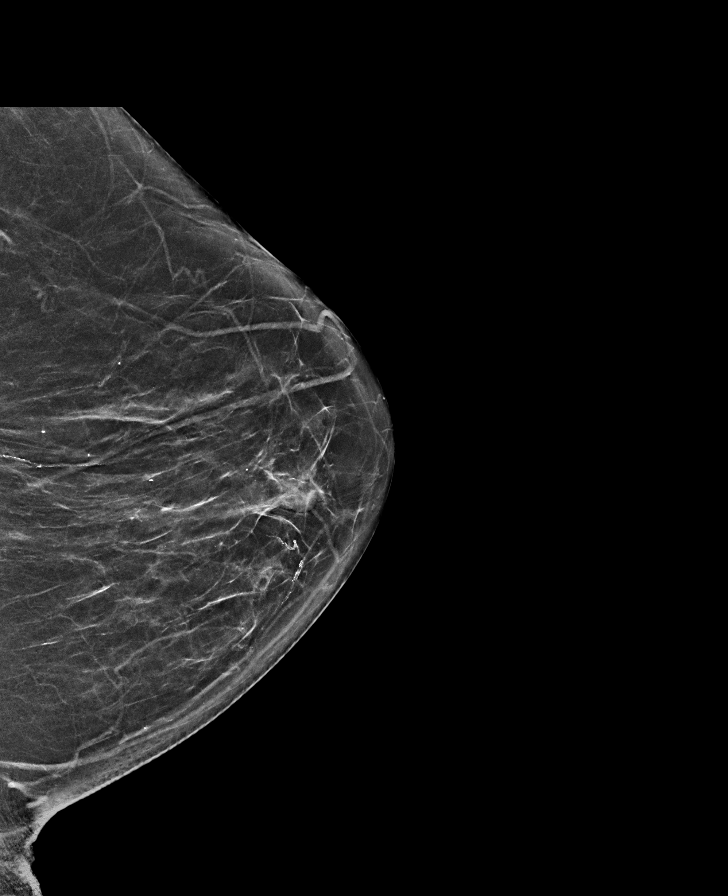

[L MLO synth-2D]
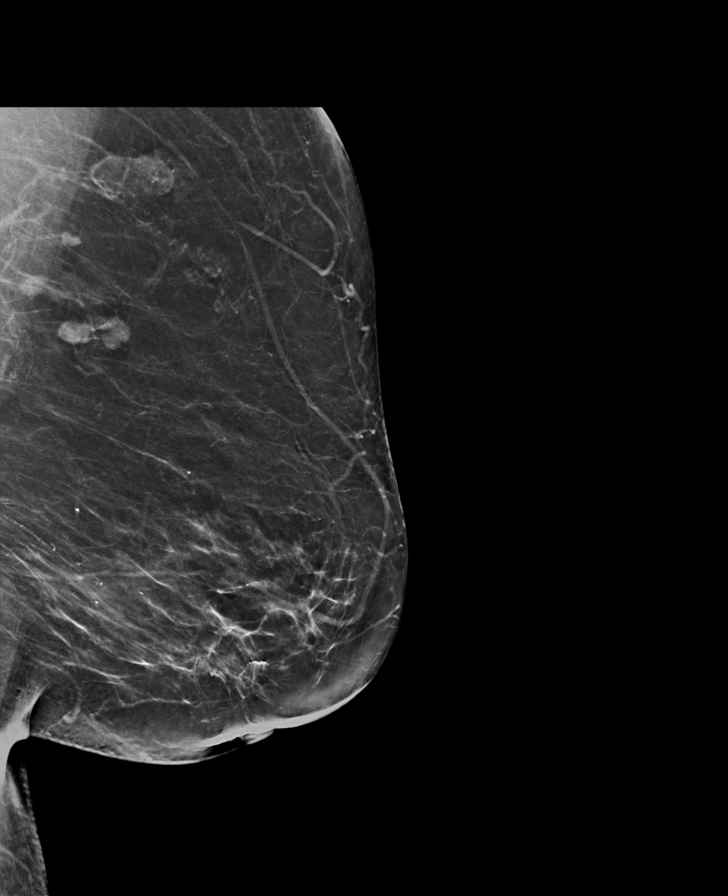

[R MLO synth-2D]
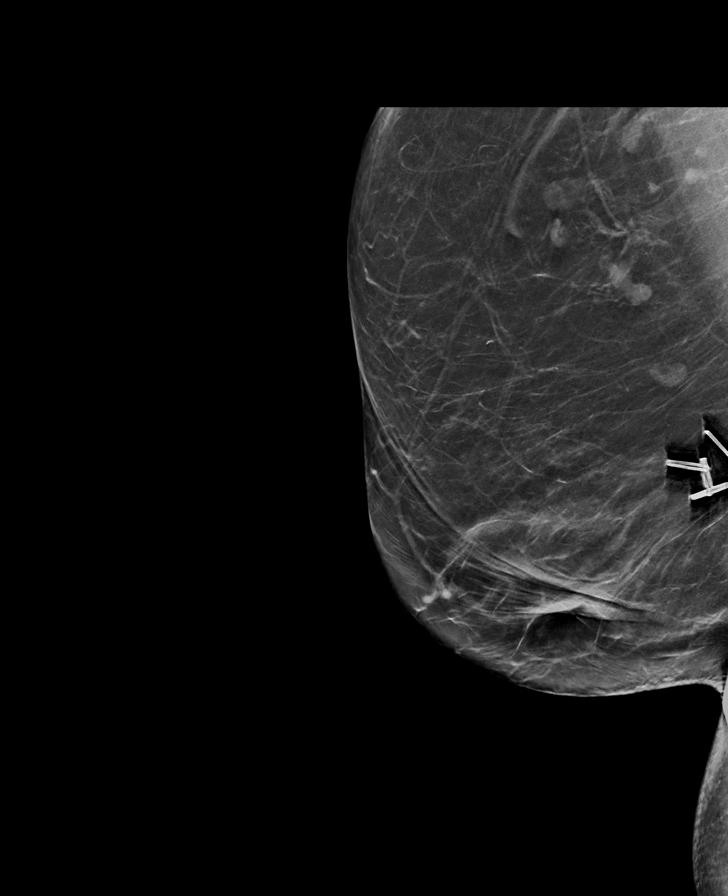

[R CC synth-2D]
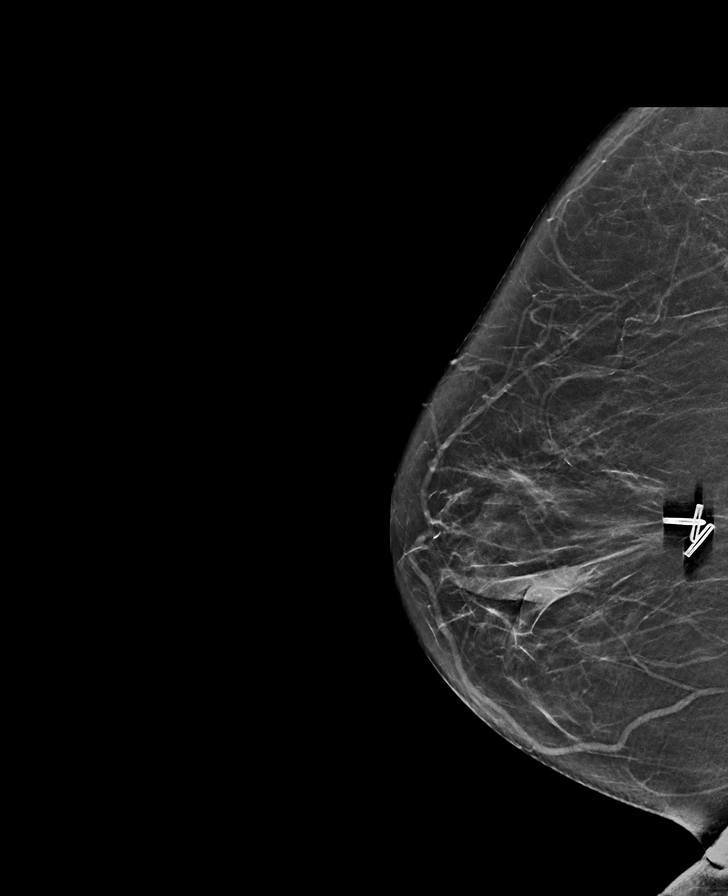

[R CC tomo · tomo slice 29/57.0]
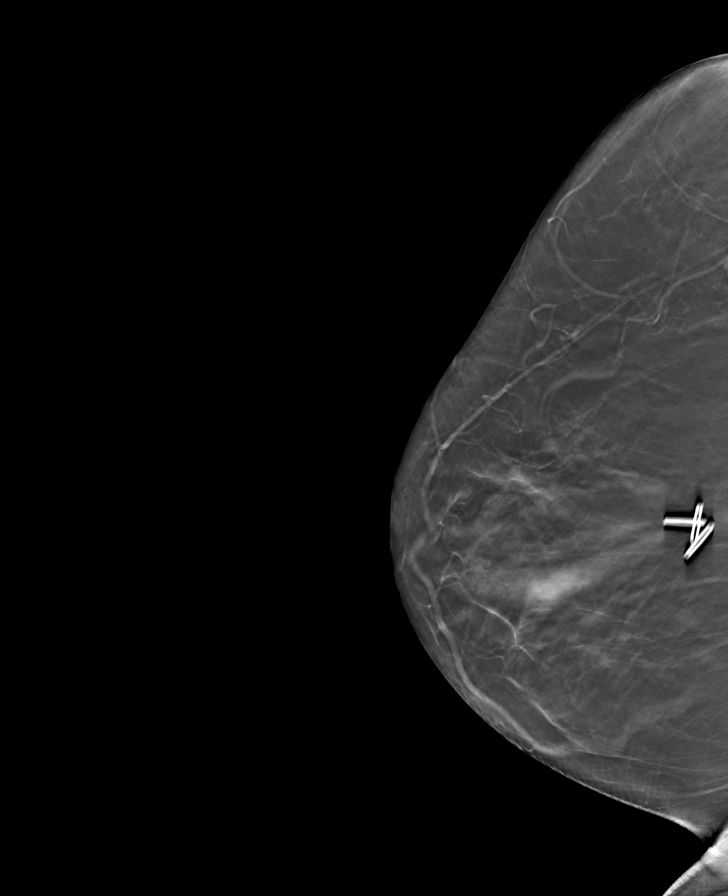

[L CC tomo · tomo slice 29/57.0]
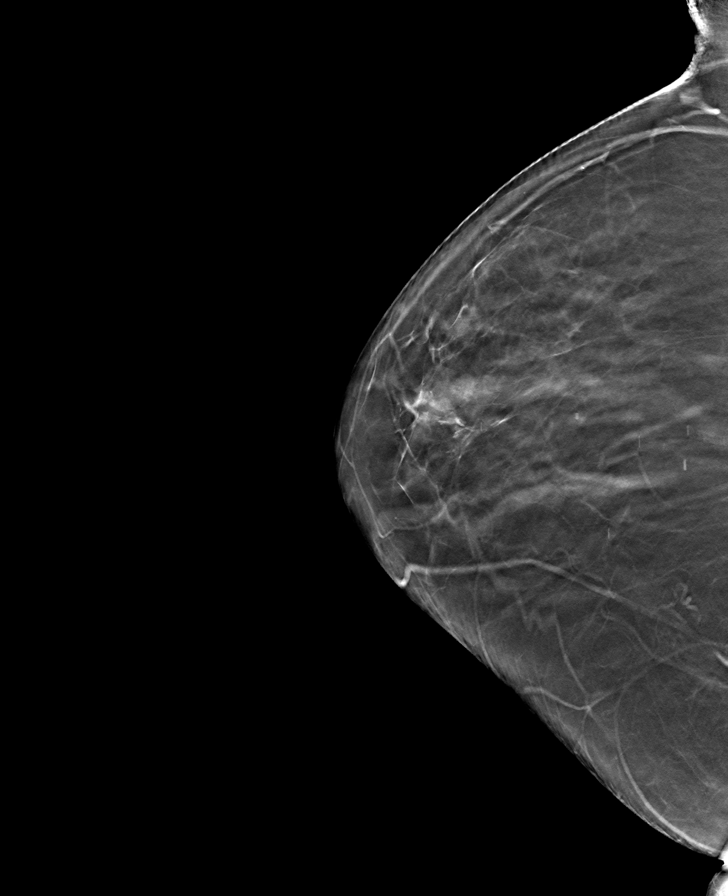

[R MLO tomo · tomo slice 33/66.0]
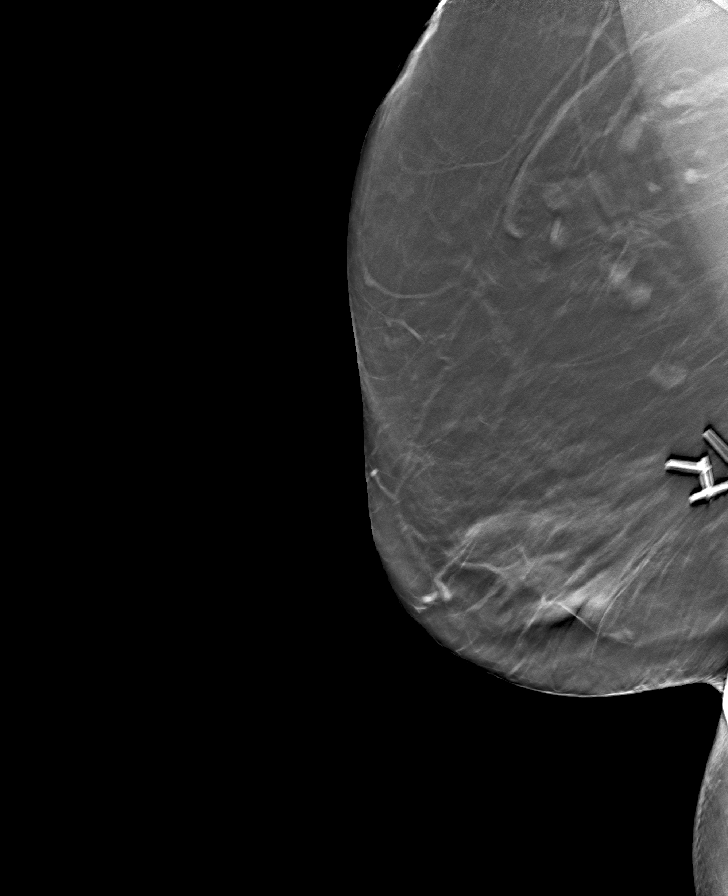

[L MLO tomo · tomo slice 35/70.0]
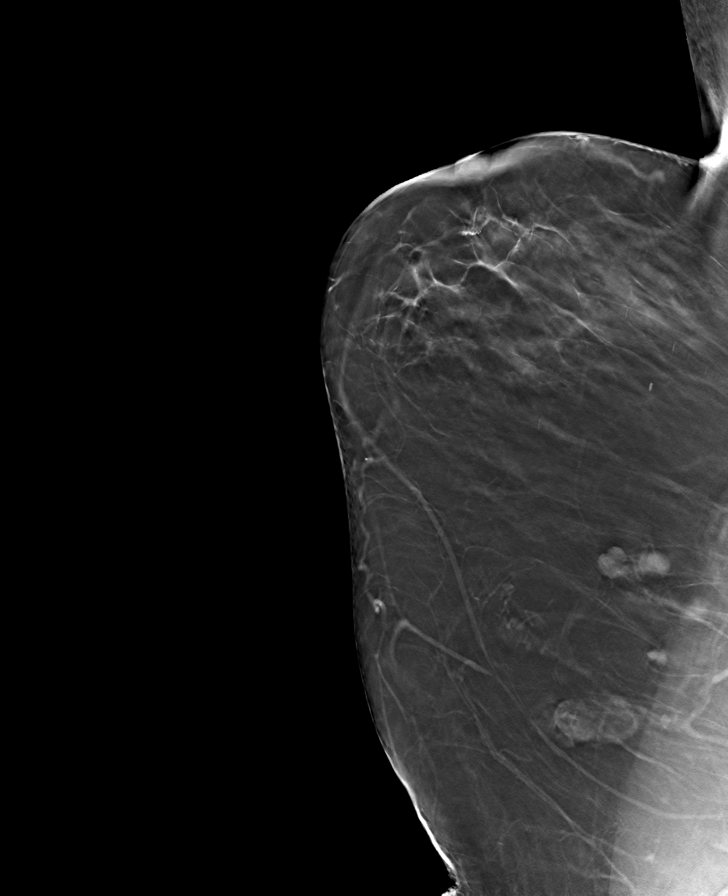

[8 of 24 positions shown; findings below may reference images not displayed]

ACR Breast Density Category b: There are scattered areas of
fibroglandular density.
FINDINGS: There are no findings suspicious for malignancy. Images were
processed with CAD.
IMPRESSION: No mammographic evidence of malignancy. A result letter of this
screening mammogram will be mailed directly to the patient.

RECOMMENDATION:
Screening mammogram in one year. (Code:[TQ])

BI-RADS CATEGORY  1: Negative.

## 2019-12-18 ENCOUNTER — Other Ambulatory Visit: Payer: Self-pay

## 2019-12-18 ENCOUNTER — Emergency Department
Admission: EM | Admit: 2019-12-18 | Discharge: 2019-12-18 | Disposition: A | Discharge status: Home / Self Care | Payer: Medicare HMO | Attending: Emergency Medicine | Admitting: Emergency Medicine

## 2019-12-18 DIAGNOSIS — Z79899 Other long term (current) drug therapy: Secondary | ICD-10-CM | POA: Insufficient documentation

## 2019-12-18 DIAGNOSIS — Z87891 Personal history of nicotine dependence: Secondary | ICD-10-CM | POA: Insufficient documentation

## 2019-12-18 DIAGNOSIS — Z7984 Long term (current) use of oral hypoglycemic drugs: Secondary | ICD-10-CM | POA: Insufficient documentation

## 2019-12-18 DIAGNOSIS — Z853 Personal history of malignant neoplasm of breast: Secondary | ICD-10-CM | POA: Insufficient documentation

## 2019-12-18 DIAGNOSIS — Z9011 Acquired absence of right breast and nipple: Secondary | ICD-10-CM | POA: Diagnosis not present

## 2019-12-18 DIAGNOSIS — I10 Essential (primary) hypertension: Secondary | ICD-10-CM | POA: Diagnosis not present

## 2019-12-18 DIAGNOSIS — Z880 Allergy status to penicillin: Secondary | ICD-10-CM | POA: Diagnosis not present

## 2019-12-18 DIAGNOSIS — R4584 Anhedonia: Secondary | ICD-10-CM | POA: Diagnosis not present

## 2019-12-18 DIAGNOSIS — F3112 Bipolar disorder, current episode manic without psychotic features, moderate: Secondary | ICD-10-CM | POA: Insufficient documentation

## 2019-12-18 DIAGNOSIS — E039 Hypothyroidism, unspecified: Secondary | ICD-10-CM | POA: Diagnosis not present

## 2019-12-18 DIAGNOSIS — F319 Bipolar disorder, unspecified: Secondary | ICD-10-CM | POA: Diagnosis present

## 2019-12-18 DIAGNOSIS — E119 Type 2 diabetes mellitus without complications: Secondary | ICD-10-CM | POA: Diagnosis not present

## 2019-12-18 DIAGNOSIS — F329 Major depressive disorder, single episode, unspecified: Secondary | ICD-10-CM | POA: Insufficient documentation

## 2019-12-18 DIAGNOSIS — F32A Depression, unspecified: Secondary | ICD-10-CM

## 2019-12-18 DIAGNOSIS — R5383 Other fatigue: Secondary | ICD-10-CM

## 2019-12-18 DIAGNOSIS — E785 Hyperlipidemia, unspecified: Secondary | ICD-10-CM | POA: Insufficient documentation

## 2019-12-18 LAB — COMPREHENSIVE METABOLIC PANEL
ALT: 22 U/L (ref 0–44)
AST: 16 U/L (ref 15–41)
Albumin: 4.1 g/dL (ref 3.5–5.0)
Alkaline Phosphatase: 43 U/L (ref 38–126)
Anion gap: 12 (ref 5–15)
BUN: 21 mg/dL (ref 8–23)
CO2: 21 mmol/L — ABNORMAL LOW (ref 22–32)
Calcium: 9.7 mg/dL (ref 8.9–10.3)
Chloride: 106 mmol/L (ref 98–111)
Creatinine, Ser: 1.19 mg/dL — ABNORMAL HIGH (ref 0.44–1.00)
GFR calc Af Amer: 52 mL/min — ABNORMAL LOW (ref >60)
GFR calc non Af Amer: 45 mL/min — ABNORMAL LOW (ref >60)
Glucose, Bld: 199 mg/dL — ABNORMAL HIGH (ref 70–99)
Potassium: 4 mmol/L (ref 3.5–5.1)
Sodium: 139 mmol/L (ref 135–145)
Total Bilirubin: 1 mg/dL (ref 0.3–1.2)
Total Protein: 7.3 g/dL (ref 6.5–8.1)

## 2019-12-18 LAB — CBC
HCT: 43.7 % (ref 36.0–46.0)
Hemoglobin: 14.4 g/dL (ref 12.0–15.0)
MCH: 29.6 pg (ref 26.0–34.0)
MCHC: 33 g/dL (ref 30.0–36.0)
MCV: 89.7 fL (ref 80.0–100.0)
Platelets: 257 10*3/uL (ref 150–400)
RBC: 4.87 MIL/uL (ref 3.87–5.11)
RDW: 13 % (ref 11.5–15.5)
WBC: 8.6 10*3/uL (ref 4.0–10.5)
nRBC: 0 % (ref 0.0–0.2)

## 2019-12-18 LAB — ACETAMINOPHEN LEVEL: Acetaminophen (Tylenol), Serum: <10 ug/mL — ABNORMAL LOW (ref 10–30)

## 2019-12-18 LAB — SALICYLATE LEVEL: Salicylate Lvl: <7 mg/dL — ABNORMAL LOW (ref 7.0–30.0)

## 2019-12-18 LAB — ETHANOL: Alcohol, Ethyl (B): <10 mg/dL (ref <10)

## 2019-12-18 MED ORDER — FLUOXETINE HCL 20 MG PO CAPS
60.0000 mg | ORAL_CAPSULE | Freq: Every day | ORAL | 0 refills | Status: DC
Start: 2019-12-18 — End: 2020-01-11

## 2019-12-18 MED ORDER — TRAZODONE HCL 100 MG PO TABS: 100.0000 mg | ORAL_TABLET | Freq: Every day | ORAL | 1 refills | Status: DC

## 2019-12-18 NOTE — ED Notes (Signed)
Psych and TTS at bedside. 

## 2019-12-18 NOTE — BH Assessment (Signed)
Assessment Note  [name] is an 74 y.o. female. Pt presented to the ED voluntarily, complaining of worsening depression and motivation to cook and clean.  Patient was calm and cooperative, and sitting quietly on his bed upon this writers arrival.  Patient reported that she has missed appointments at [facility] and has ran out of her psych medications. Pt identified her main stressors as interacting with people, and not wanting to be alone. The patient became labile when describing her difficulty managing her medications and relating to other people.  Patient reported multiple previous psychiatric admissions; the most recent being in February 2021. Pt currently sees a psychiatrist at [facility], but was unable to recall names. The patient rated her depression as a medium on a scale of low, medium, or high. Patient also reported increasingly severe anxiety. Patient reported that she is an ex-smoker. The patient reported that lives alone but is unmotivated to cook and clean as she once used to. Pt stated that she has no family supports. Patient denied substance abuse. Patient reported that she has poor sleep and only gets sleep when she has the appropriate sleep medications. Patient also reported a decreased appetite. Denies any hallucinations, suicidal/homicidal ideations, or paranoia.   Diagnosis: Bipolar Disorder, depressed  Past Medical History:  Past Medical History:  Diagnosis Date   Arthritis    Breast cancer ([place])    Cancer ([place]) 2004   right breast ca   Diabetes mellitus without complication ([place])    Hypercholesterolemia    Hypertension    Hypothyroid    Seasonal allergies     Past Surgical History:  Procedure Laterality Date   ABDOMINAL HYSTERECTOMY     BREAST BIOPSY Left    neg   BREAST EXCISIONAL BIOPSY Right 2004   breast ca and lumpectomy   BREAST LUMPECTOMY Right 2004   breast ca   CHOLECYSTECTOMY     COLONOSCOPY WITH PROPOFOL N/A 02/13/2017   Procedure:  COLONOSCOPY WITH PROPOFOL;  Surgeon: Jonathon Bellows, MD;  Location[facility] ENDOSCOPY;  Service: Gastroenterology;  Laterality: N/A;   KIDNEY STONE SURGERY      Family History:  Family History  Problem Relation Age of Onset   Bladder Cancer Neg Hx    Kidney cancer Neg Hx     Social History:  reports that she has quit smoking. Her smoking use included cigarettes. She has never used smokeless tobacco. She reports that she does not drink alcohol and does not use drugs.  Additional Social History:  Alcohol / Drug Use Pain Medications: See PTA Prescriptions: See PTA History of alcohol / drug use?: No history of alcohol / drug abuse  CIWA: CIWA-Ar BP: (!) 136/90 Pulse Rate: 52 COWS:    Allergies:  Allergies  Allergen Reactions   Navane [Thiothixene] Other (See Comments)    Reaction:  Unknown   Penicillins Rash    Has patient had a PCN reaction causing immediate rash, facial/tongue/throat swelling, SOB or lightheadedness with hypotension: No Has patient had a PCN reaction causing severe rash involving mucus membranes or skin necrosis: No Has patient had a PCN reaction that required hospitalization: No Has patient had a PCN reaction occurring within the last 10 years: No If all of the above answers are "NO", then may proceed with Cephalosporin use.     Home Medications: (Not in a hospital admission)   OB/GYN Status:  No LMP recorded. Patient has had a hysterectomy.  General Assessment Data Location of Assessment: [facility] ED TTS Assessment: In system Is  this a Tele or Face-to-Face Assessment?: Face-to-Face Is this an Initial Assessment or a Re-assessment for this encounter?: Initial Assessment Patient Accompanied by:: N/A Language Other than English: No Living Arrangements: Other (Comment) What gender do you identify as?: Female Date Telepsych consult ordered in [facility]: 12/18/19 Time Telepsych consult ordered in [facility]: 0900 Marital status: Widowed [place] name: N/A Pregnancy  Status: No Living Arrangements: Alone Can pt return to current living arrangement?: Yes Admission Status: Voluntary Is patient capable of signing voluntary admission?: Yes Referral Source: Self/Family/Friend Insurance type: [employer] Medicare  Medical Screening Exam ([place]) Medical Exam completed: Yes  Crisis Care Plan Living Arrangements: Alone Legal Guardian: Other: (Self) Name of Psychiatrist: [facility]  Education Status Is patient currently in school?: No Is the patient employed, unemployed or receiving disability?: Unemployed  Risk to self with the past 6 months Suicidal Ideation: No Has patient been a risk to self within the past 6 months prior to admission? : No Suicidal Intent: No Has patient had any suicidal intent within the past 6 months prior to admission? : No Is patient at risk for suicide?: No Suicidal Plan?: No Has patient had any suicidal plan within the past 6 months prior to admission? : No Access to Means: No What has been your use of drugs/alcohol within the last 12 months?: N/A Previous Attempts/Gestures: No How many times?: 0 Other Self Harm Risks: n/a Triggers for Past Attempts: None known Intentional Self Injurious Behavior: None Family Suicide History: No Recent stressful life event(s): Conflict (Comment) Persecutory voices/beliefs?: No Depression: Yes Depression Symptoms: Tearfulness, Loss of interest in usual pleasures, Feeling worthless/self pity Substance abuse history and/or treatment for substance abuse?: No Suicide prevention information given to non-admitted patients: Not applicable  Risk to Others within the past 6 months Homicidal Ideation: No Does patient have any lifetime risk of violence toward others beyond the six months prior to admission? : No Thoughts of Harm to Others: No Current Homicidal Intent: No Current Homicidal Plan: No Access to Homicidal Means: No Identified Victim: n/a History of harm to others?:  No Assessment of Violence: None Noted Violent Behavior Description: n/a Does patient have access to weapons?: No Criminal Charges Pending?: No Does patient have a court date: No Is patient on probation?: No  Psychosis Hallucinations: None noted Delusions: None noted  Mental Status Report Appearance/Hygiene: Unremarkable Eye Contact: Good Motor Activity: Freedom of movement, Gestures Speech: Loud, Logical/coherent, Tangential Level of Consciousness: Alert Mood: Anxious, Labile, Despair Affect: Anxious, Labile, Irritable Thought Processes: Tangential, Relevant, Coherent Judgement: Unimpaired Orientation: Person, Place, Time, Situation Obsessive Compulsive Thoughts/Behaviors: None  Cognitive Functioning Concentration: Normal Memory: Recent Intact, Remote Impaired Is patient IDD: No Insight: Good Impulse Control: Poor Appetite: Poor Have you had any weight changes? : No Change Sleep: Decreased Total Hours of Sleep:  (Sleeps only when on proper sleep medications) Vegetative Symptoms: None  ADLScreening [facility] Assessment Services) Patient's cognitive ability adequate to safely complete daily activities?: Yes Patient able to express need for assistance with ADLs?: No Independently performs ADLs?: Yes (appropriate for developmental age)  Prior Inpatient Therapy Prior Inpatient Therapy: Yes Prior Therapy Dates: 06/2019 Prior Therapy Facilty/Provider(s): [facility] Reason for Treatment: Bipolar Depression  Prior Outpatient Therapy Prior Outpatient Therapy: Yes Prior Therapy Dates:  (Pt unable to recollect) Prior Therapy Facilty/Provider(s): [facility] Reason for Treatment: Bipolar Does patient have an ACCT team?: No Does patient have Intensive In-House Services?  : No Does patient have [facility] services? : No Does patient have P4CC services?: No  ADL  Screening (condition at time of admission) Patient's cognitive ability adequate to safely complete daily activities?:  Yes Is the patient deaf or have difficulty hearing?: No Does the patient have difficulty seeing, even when wearing glasses/contacts?: No Does the patient have difficulty concentrating, remembering, or making decisions?: No Patient able to express need for assistance with ADLs?: No Does the patient have difficulty dressing or bathing?: No Independently performs ADLs?: Yes (appropriate for developmental age) Does the patient have difficulty walking or climbing stairs?: No Weakness of Legs: None Weakness of Arms/Hands: None  Home Assistive Devices/Equipment Home Assistive Devices/Equipment: None  Therapy Consults (therapy consults require a physician order) PT Evaluation Needed: No OT Evalulation Needed: No SLP Evaluation Needed: No   Values / Beliefs Cultural Requests During Hospitalization: None Spiritual Requests During Hospitalization: None Consults Spiritual Care Consult Needed: No Transition of Care Team Consult Needed: No            Disposition: Per psych MD, Patient does not meet criteria for psychiatric inpatient admission.  Disposition Initial Assessment Completed for this Encounter: Yes  On Site Evaluation by:   Reviewed with Physician:    Kathi Ludwig 12/18/2019 11:16 AM

## 2019-12-18 NOTE — ED Provider Notes (Signed)
[facility] Emergency Department Provider Note ____________________________________________   First MD Initiated Contact with Patient 12/18/19 [id number]     (approximate)  I have reviewed the triage vital signs and the nursing notes.  HISTORY  Chief Complaint Depression   HPI [name] is a 74 y.o. female who presents to the ED for worsening depressive symptoms.    History of DM on multiple oral agents, HTN and HLD.  Hypothyroidism on Synthroid.  Anxiety depression on as needed clonazepam, SSRI. Patient reports living alone in a local apartment without pets or family.  Ambulatory independently.  Patient reports progressively worsening depressive symptoms for the past 5 months that become debilitating in nature making it difficult to live her life.  She reports uncertainty and what to do and exclaims difficulty knowing what people expect of her.  She reports being frozen by indecision due to this uncertainty and reports having difficulty "doing anything."   Patient adamantly denies suicidality, and has no plan.  Denies audiovisual hallucinations or homicidality as well.  She is tearful and explains that she always pushes people away from her and is now alone without support system.    Past Medical History:  Diagnosis Date  . Arthritis   . Breast cancer ([place])   . Cancer [facility]) 2004   right breast ca  . Diabetes mellitus without complication ([place])   . Hypercholesterolemia   . Hypertension   . Hypothyroid   . Seasonal allergies     Patient Active Problem List   Diagnosis Date Noted  . MDD (major depressive disorder) 08/10/2019  . MDD (major depressive disorder), recurrent episode, severe ([place]) 06/24/2019  . Pain due to onychomycosis of toenails of both feet 11/23/2018  . Bipolar 1 disorder, manic, moderate ([place]) 07/21/2018  . Bipolar 1 disorder, mixed, severe ([place]) 07/21/2018  . Bipolar 1 disorder with moderate mania ([place]) 07/20/2018  .  Severe bipolar I disorder, current or most recent episode depressed ([place]) 07/07/2017  . PTSD (post-traumatic stress disorder) 07/07/2017  . 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 Diabetes ([place] [address]) 09/23/2014  . Essential hypertension 09/21/2014  . Hypothyroidism 09/21/2014  . Dyslipidemia 09/21/2014  . Bipolar I disorder, current or most recent episode manic, severe with mixed features ([place]) 09/20/2014    Past Surgical History:  Procedure Laterality Date  . ABDOMINAL HYSTERECTOMY    . BREAST BIOPSY Left    neg  . BREAST EXCISIONAL BIOPSY Right 2004   breast ca and lumpectomy  . BREAST LUMPECTOMY Right 2004   breast ca  . CHOLECYSTECTOMY    . COLONOSCOPY WITH PROPOFOL N/A 02/13/2017   Procedure: COLONOSCOPY WITH PROPOFOL;  Surgeon: Jonathon Bellows, MD;  Location: [facility] ENDOSCOPY;  Service: Gastroenterology;  Laterality: N/A;  . KIDNEY STONE SURGERY      Prior to Admission medications   Medication Sig Start Date End Date Taking? Authorizing Provider  ACCU-CHEK AVIVA PLUS test strip  07/26/19   [provider]  Accu-Chek Softclix Lancets lancets  07/26/19   [provider]  clonazePAM (KLONOPIN) 1 MG tablet Take 1 tablet (1 mg total) by mouth at bedtime. 08/13/19   Money, Lowry Ram, FNP  divalproex (DEPAKOTE ER) 500 MG 24 hr tablet Take 2 tablets (1,000 mg total) by mouth at bedtime. 08/13/19   Money, Lowry Ram, FNP  enalapril (VASOTEC) 5 MG tablet Take 1 tablet (5 mg total) by mouth 2 (two) times daily. 08/13/19   Money, Lowry Ram, FNP  FLUoxetine (  PROZAC) 20 MG capsule Take 3 capsules (60 mg total) by mouth daily. 12/18/19 01/17/20  Rulon Sera, MD  glipiZIDE (GLUCOTROL XL) 5 MG 24 hr tablet Take 1 tablet (5 mg total) by mouth 2 (two) times daily. 08/13/19   Money, Lowry Ram, FNP  levothyroxine (SYNTHROID) 88 MCG tablet Take 1 tablet (88 mcg total) by mouth daily before breakfast. 08/13/19   Money, Lowry Ram, FNP  linagliptin (TRADJENTA) 5 MG TABS  tablet Take 1 tablet (5 mg total) by mouth daily. 08/13/19   Money, Lowry Ram, FNP  loratadine (CLARITIN) 10 MG tablet Take 1 tablet (10 mg total) by mouth daily. 08/13/19   Money, Lowry Ram, FNP  lurasidone (LATUDA) 20 MG TABS tablet Take 1 tablet (20 mg total) by mouth daily with supper. 08/13/19   Money, Lowry Ram, FNP  metFORMIN (GLUCOPHAGE) 500 MG tablet Take 1 tablet (500 mg total) by mouth 2 (two) times daily with a meal. 08/13/19   Money, Lowry Ram, FNP  PAZEO 0.7 % SOLN Take 1 drop by mouth daily.  08/25/18   [provider]  rosuvastatin (CRESTOR) 40 MG tablet Take 1 tablet (40 mg total) by mouth daily. 08/13/19   Money, Lowry Ram, FNP  sulfamethoxazole-trimethoprim (BACTRIM DS) 800-160 MG tablet  09/06/19   [provider]  traZODone (DESYREL) 100 MG tablet Take 1 tablet (100 mg total) by mouth at bedtime. 12/18/19   Rulon Sera, MD    Allergies Navane [thiothixene] and Penicillins  Family History  Problem Relation Age of Onset  . Bladder Cancer Neg Hx   . Kidney cancer Neg Hx     Social History Social History   Tobacco Use  . Smoking status: Former Smoker    Types: Cigarettes  . Smokeless tobacco: Never Used  Vaping Use  . Vaping Use: Never used  Substance Use Topics  . Alcohol use: No  . Drug use: No    Review of Systems  Constitutional: No fever/chills Eyes: No visual changes. ENT: No sore throat. Cardiovascular: Denies chest pain. Respiratory: Denies shortness of breath. Gastrointestinal: No abdominal pain.  No nausea, no vomiting.  No diarrhea.  No constipation. Genitourinary: Negative for dysuria. Musculoskeletal: Negative for back pain. Skin: Negative for rash. Neurological: Negative for headaches, focal weakness or numbness. Psychiatric: Positive for depression, negative for suicidality and homicidality.  ____________________________________________   PHYSICAL EXAM:  VITAL SIGNS: ED Triage Vitals  Enc Vitals Group     BP 12/18/19 0703 (!)  140/83     Pulse Rate 12/18/19 0703 66     Resp 12/18/19 0703 16     Temp 12/18/19 0703 98.6 F (37 C)     Temp Source 12/18/19 0703 Oral     SpO2 12/18/19 0703 94 %     Weight 12/18/19 0704 176 lb 5.9 oz (80 kg)     Height 12/18/19 0704 5\' 1"  (1.549 m)     Head Circumference --      Peak Flow --      Pain Score 12/18/19 0704 0     Pain Loc --      Pain Edu? --      Excl. in [place]? --      Constitutional: Alert and oriented.  Supine in bed, resting.  Once we began our discussion she becomes tearful when describing her home and social situation. Eyes: Conjunctivae are normal. PERRL. EOMI. Head: Atraumatic. Nose: No congestion/rhinnorhea. Mouth/Throat: Mucous membranes are moist.  Oropharynx non-erythematous. Neck: No stridor. No cervical spine  tenderness to palpation. Cardiovascular: Normal rate, regular rhythm. Grossly normal heart sounds.  Good peripheral circulation. Respiratory: Normal respiratory effort.  No retractions. Lungs CTAB. Gastrointestinal: Soft , nondistended, nontender to palpation. No abdominal bruits. No CVA tenderness. Musculoskeletal: No lower extremity tenderness nor edema.  No joint effusions. No signs of acute trauma. Neurologic:  Normal speech and language. No gross focal neurologic deficits are appreciated. No gait instability noted. Skin:  Skin is warm, dry and intact. No rash noted. Psychiatric: Mood and affect are appropriate.  Linear thought processes.  ____________________________________________   LABS (all labs ordered are listed, but only abnormal results are displayed)  Labs Reviewed  COMPREHENSIVE METABOLIC PANEL - Abnormal; Notable for the following components:      Result Value   CO2 21 (*)    Glucose, Bld 199 (*)    Creatinine, Ser 1.19 (*)    GFR calc non Af Amer 45 (*)    GFR calc Af Amer 52 (*)    All other components within normal limits  SALICYLATE LEVEL - Abnormal; Notable for the following components:   Salicylate Lvl <4.0 (*)     All other components within normal limits  ACETAMINOPHEN LEVEL - Abnormal; Notable for the following components:   Acetaminophen (Tylenol), Serum <10 (*)    All other components within normal limits  ETHANOL  CBC   ____________________________________________  12 Lead EKG   ____________________________________________  RADIOLOGY  ED MD interpretation:    Official radiology report(s): No results found.  ____________________________________________   PROCEDURES and INTERVENTIONS  Procedure(s) performed (including Critical Care):  Procedures  Medications - No data to display  ____________________________________________   INITIAL IMPRESSION / ASSESSMENT AND PLAN / ED COURSE  74 year old woman presenting from home with acute depression without evidence of suicidality, and ultimately amenable to outpatient management with psychiatric follow-up.  Normal vital signs on room air.  Exam with a tearful and depressed patient, had evidence of acute traumatic pathology, neurovascular deficits or other acute pathology beyond her mental status.  She is adamantly denying suicidality, hallucinations.  Due to the severity of her symptoms and its debilitating nature, consulted psychiatry who provided medication adjustments and good outpatient resources for the patient.  We discussed return precautions for the ED, patient is medically stable for discharge home.  Clinical Course as of Dec 17 1624  Sat Dec 18, 2019  1050 Psychiatrist has evaluated the patient, made medication arrangements/adjustments, and is comfortable discharging the patient.  I agree.  No need for IVC this time.   [DS]    Clinical Course User Index [DS] Vladimir Crofts, MD     ____________________________________________   FINAL CLINICAL IMPRESSION(S) / ED DIAGNOSES  Final diagnoses:  Acute depression  Anhedonia  Low energy     ED Discharge Orders         Ordered    FLUoxetine (PROZAC) 20 MG capsule  Daily      Discontinue  Reprint     12/18/19 1038    traZODone (DESYREL) 100 MG tablet  Daily at bedtime     Discontinue  Reprint     12/18/19 1038    Increase activity slowly     Discontinue     12/18/19 1038    Diet - low sodium heart healthy     Discontinue     12/18/19 [place]   Note:  This document was prepared using Systems analyst and may include unintentional  dictation errors.   Vladimir Crofts, MD 12/18/19 [phone]

## 2019-12-18 NOTE — ED Triage Notes (Signed)
PT to ED reporting worsening depression and feeling like she is not able to get up and do the things she needs to throughout the day. PT has a hx of depression and ED evaluation. Pts Klonopin prescription ran out and she has been taking Prozac without relief. PT tearful in triage and reports she has not been sleeping well. No SI or HI and pt denies drug or alcohol use.

## 2019-12-18 NOTE — Discharge Instructions (Signed)
Please adjust medications as the psychiatrist described.  Follow-up with your PCP as planned.  If you develop any worsening symptoms despite this, any thoughts or plans to hurt yourself, please immediately return to the ED.

## 2019-12-18 NOTE — ED Notes (Signed)
Pt states that she has depression and can't do the things that she wants to do anymore like cook or clean. Pt states that she woke up this morning and couldn't get out of bed because she just couldn't make herself. Pt tearful, sad. Cooperative and calm at this time. AOx4.

## 2019-12-18 NOTE — ED Notes (Signed)
Pt given belongings bag 1/1 back.

## 2019-12-18 NOTE — Consult Note (Signed)
[facility] Psychiatry Consult   Reason for Consult:  Psych eval Referring Physician: Dr. Archie Balboa Patient Identification: [name] MRN:  [id number] Principal Diagnosis: <principal problem not specified> Diagnosis:  Active Problems:   * No active hospital problems. * Bipolar disorder, depressed  Total Time spent with patient: 1 hour   HPI:   Patient is a domiciled and divorced 74 year old Caucasian female residing in [place] who presented to the due to increased anxiety and sleep fluctuations.  Indicates that she has run out of her psychiatric medications but does not know exactly which ones.  Says that since her last admission in March 2021 to a psychiatric unit, she has unable to get in to an appointment at [facility].  Denies thoughts of suicide and reports moderate depression.  Says that she has been feeling anxious and stress, also "under pressure."  She denies history of anger.  No psychosis.  No symptoms of recent hypomania or mania.  Cites no other stressors at this time.  Mentions that she is running out of her trazodone.  She is agreeable to increase Prozac to 60 mg.  Next appointment with [facility] will be December 30, 2019.  Also, she will see her primary care provider next Thursday.  Past Psychiatric History:Multiple psychiatric admissions good medication compliance.  Risk to Self:   Risk to Others:   Prior Inpatient Therapy:   Prior Outpatient Therapy:     Past Medical History:  Past Medical History:  Diagnosis Date  . Arthritis   . Breast cancer ([facility])   . Cancer [facility]) 2004   right breast ca  . Diabetes mellitus without complication ([place])   . Hypercholesterolemia   . Hypertension   . Hypothyroid   . Seasonal allergies     Past Surgical History:  Procedure Laterality Date  . ABDOMINAL HYSTERECTOMY    . BREAST BIOPSY Left    neg  . BREAST EXCISIONAL BIOPSY Right 2004   breast ca and lumpectomy  . BREAST LUMPECTOMY Right 2004   breast ca  .  CHOLECYSTECTOMY    . COLONOSCOPY WITH PROPOFOL N/A 02/13/2017   Procedure: COLONOSCOPY WITH PROPOFOL;  Surgeon: Jonathon Bellows, MD;  Location: [facility] ENDOSCOPY;  Service: Gastroenterology;  Laterality: N/A;  . KIDNEY STONE SURGERY     Family History:  Family History  Problem Relation Age of Onset  . Bladder Cancer Neg Hx   . Kidney cancer Neg Hx    Family Psychiatric  History: unknown Social History:  Social History   Substance and Sexual Activity  Alcohol Use No     Social History   Substance and Sexual Activity  Drug Use No    Social History   Socioeconomic History  . Marital status: Divorced    Spouse name: Not on file  . Number of children: Not on file  . Years of education: Not on file  . Highest education level: Not on file  Occupational History  . Not on file  Tobacco Use  . Smoking status: Former Smoker    Types: Cigarettes  . Smokeless tobacco: Never Used  Vaping Use  . Vaping Use: Never used  Substance and Sexual Activity  . Alcohol use: No  . Drug use: No  . Sexual activity: Not Currently  Other Topics Concern  . Not on file  Social History Narrative  . Not on file   Social Determinants of Health   Financial Resource Strain:   . Difficulty of Paying Living Expenses:   Food Insecurity:   .  Worried About Charity fundraiser in the Last Year:   . Arboriculturist in the Last Year:   Transportation Needs:   . Film/video editor (Medical):   Marland Kitchen Lack of Transportation (Non-Medical):   Physical Activity:   . Days of Exercise per Week:   . Minutes of Exercise per Session:   Stress:   . Feeling of Stress :   Social Connections:   . Frequency of Communication with Friends and Family:   . Frequency of Social Gatherings with Friends and Family:   . Attends Religious Services:   . Active Member of Clubs or Organizations:   . Attends Archivist Meetings:   Marland Kitchen Marital Status:    Additional Social History:    Allergies:   Allergies  Allergen  Reactions  . Navane [Thiothixene] Other (See Comments)    Reaction:  Unknown  . Penicillins Rash    Has patient had a PCN reaction causing immediate rash, facial/tongue/throat swelling, SOB or lightheadedness with hypotension: No Has patient had a PCN reaction causing severe rash involving mucus membranes or skin necrosis: No Has patient had a PCN reaction that required hospitalization: No Has patient had a PCN reaction occurring within the last 10 years: No If all of the above answers are "NO", then may proceed with Cephalosporin use.     Labs:  Results for orders placed or performed during the hospital encounter of 12/18/19 (from the past 48 hour(s))  Comprehensive metabolic panel     Status: Abnormal   Collection Time: 12/18/19  7:06 AM  Result Value Ref Range   Sodium 139 135 - 145 mmol/L   Potassium 4.0 3.5 - 5.1 mmol/L   Chloride 106 98 - 111 mmol/L   CO2 21 (L) 22 - 32 mmol/L   Glucose, Bld 199 (H) 70 - 99 mg/dL    Comment: Glucose reference range applies only to samples taken after fasting for at least 8 hours.   BUN 21 8 - 23 mg/dL   Creatinine, Ser 1.19 (H) 0.44 - 1.00 mg/dL   Calcium 9.7 8.9 - 10.3 mg/dL   Total Protein 7.3 6.5 - 8.1 g/dL   Albumin 4.1 3.5 - 5.0 g/dL   AST 16 15 - 41 U/L   ALT 22 0 - 44 U/L   Alkaline Phosphatase 43 38 - 126 U/L   Total Bilirubin 1.0 0.3 - 1.2 mg/dL   GFR calc non Af Amer 45 (L) >60 mL/min   GFR calc Af Amer 52 (L) >60 mL/min   Anion gap 12 5 - 15    Comment: Performed at [facility], [address] [place], [place] [address]  Ethanol     Status: None   Collection Time: 12/18/19  7:06 AM  Result Value Ref Range   Alcohol, Ethyl (B) <10 <10 mg/dL    Comment: (NOTE) Lowest detectable limit for serum alcohol is 10 mg/dL.  For medical purposes only. Performed at [facility], [address] [place], [place] [address]   Salicylate level     Status: Abnormal   Collection Time: 12/18/19  7:06 AM  Result  Value Ref Range   Salicylate Lvl <4.7 (L) 7.0 - 30.0 mg/dL    Comment: Performed at [facility], [address] [place], [place] [address]  Acetaminophen level     Status: Abnormal   Collection Time: 12/18/19  7:06 AM  Result Value Ref Range   Acetaminophen (Tylenol), Serum <10 (L) 10 -  30 ug/mL    Comment: (NOTE) Therapeutic concentrations vary significantly. A range of 10-30 ug/mL  may be an effective concentration for many patients. However, some  are best treated at concentrations outside of this range. Acetaminophen concentrations >150 ug/mL at 4 hours after ingestion  and >50 ug/mL at 12 hours after ingestion are often associated with  toxic reactions.  Performed at [facility], [address] [place], [place] [address]   cbc     Status: None   Collection Time: 12/18/19  7:06 AM  Result Value Ref Range   WBC 8.6 4.0 - 10.5 K/uL   RBC 4.87 3.87 - 5.11 MIL/uL   Hemoglobin 14.4 12.0 - 15.0 g/dL   HCT 43.7 36 - 46 %   MCV 89.7 80.0 - 100.0 fL   MCH 29.6 26.0 - 34.0 pg   MCHC 33.0 30.0 - 36.0 g/dL   RDW 13.0 11.5 - 15.5 %   Platelets 257 150 - 400 K/uL   nRBC 0.0 0.0 - 0.2 %    Comment: Performed at [facility], [address] [place], [place] [address]    No current facility-administered medications for this encounter.   Current Outpatient Medications  Medication Sig Dispense Refill  . ACCU-CHEK AVIVA PLUS test strip     . Accu-Chek Softclix Lancets lancets     . clonazePAM (KLONOPIN) 1 MG tablet Take 1 tablet (1 mg total) by mouth at bedtime. 30 tablet 0  . divalproex (DEPAKOTE ER) 500 MG 24 hr tablet Take 2 tablets (1,000 mg total) by mouth at bedtime. 60 tablet 1  . enalapril (VASOTEC) 5 MG tablet Take 1 tablet (5 mg total) by mouth 2 (two) times daily. 60 tablet 1  . FLUoxetine (PROZAC) 20 MG capsule Take 3 capsules (60 mg total) by mouth daily. 90 capsule 0  . glipiZIDE (GLUCOTROL XL) 5 MG 24 hr tablet Take 1 tablet (5 mg total)  by mouth 2 (two) times daily. 60 tablet 1  . levothyroxine (SYNTHROID) 88 MCG tablet Take 1 tablet (88 mcg total) by mouth daily before breakfast. 30 tablet 1  . linagliptin (TRADJENTA) 5 MG TABS tablet Take 1 tablet (5 mg total) by mouth daily. 30 tablet 1  . loratadine (CLARITIN) 10 MG tablet Take 1 tablet (10 mg total) by mouth daily. 30 tablet 1  . lurasidone (LATUDA) 20 MG TABS tablet Take 1 tablet (20 mg total) by mouth daily with supper. 30 tablet 1  . metFORMIN (GLUCOPHAGE) 500 MG tablet Take 1 tablet (500 mg total) by mouth 2 (two) times daily with a meal. 60 tablet 1  . PAZEO 0.7 % SOLN Take 1 drop by mouth daily.     . rosuvastatin (CRESTOR) 40 MG tablet Take 1 tablet (40 mg total) by mouth daily. 30 tablet 1  . sulfamethoxazole-trimethoprim (BACTRIM DS) 800-160 MG tablet     . traZODone (DESYREL) 100 MG tablet Take 1 tablet (100 mg total) by mouth at bedtime. 30 tablet 1    Musculoskeletal: Strength & Muscle Tone: within normal limits Gait & Station: normal Patient leans: Front  Psychiatric Specialty Exam: Physical Exam  Review of Systems  Blood pressure (!) 140/83, pulse 66, temperature 98.6 F (37 C), temperature source Oral, resp. rate 16, height 5\' 1"  (1.549 m), weight 80 kg, SpO2 94 %.Body mass index is 33.32 kg/m.  General Appearance: Well Groomed  Eye Contact:  Good  Speech:  Negative  Volume:  Normal  Mood:  Anxious  Affect:  Congruent  Thought Process:  Coherent  Orientation:  Full (Time, Place, and Person)  Thought Content:  Negative  Suicidal Thoughts:  No  Homicidal Thoughts:  No  Memory:  Negative  Judgement:  Good  Insight:  Good  Psychomotor Activity:  Normal  Concentration:  Concentration: Good  Recall:  Good  Fund of Knowledge:  Good  Language:  Good  Akathisia:  Negative  Handed:  Right  AIMS (if indicated):     Assets:  Physical Health  ADL's:  Intact  Cognition:  WNL  Sleep:        Treatment Plan Summary: Daily contact with patient  to assess and evaluate symptoms and progress in treatment  Disposition: Patient does not meet criteria for psychiatric inpatient admission.   For the time being we will go ahead and increase her Prozac to 60 mg.  Monitor for mania Patient says that she is not on Depakote only longer but believes that she is on [place] which is on her last medication list that we have.  Indicates that she usually brings her bottles in with her but has forgotten them Needs refill on trazodone 100 mg Patient does not meet inpatient psychiatric criteria at this time. If the patient develops suicidal or homicidal ideations please call 911 or go to the local emergency department.  Patient voices agreement are standing.  Psychiatric evaluation psychiatric evaluation  Rulon Sera, MD 12/18/2019 10:42 AM

## 2019-12-18 NOTE — ED Notes (Signed)
Pt given meal tray.

## 2020-01-07 ENCOUNTER — Inpatient Hospital Stay
Admission: RE | Admit: 2020-01-07 | Discharge: 2020-01-11 | DRG: 885 | Disposition: A | Discharge status: Home / Self Care | Payer: Medicare HMO | Source: Intra-hospital | Attending: Internal Medicine | Admitting: Internal Medicine

## 2020-01-07 ENCOUNTER — Emergency Department
Admission: EM | Admit: 2020-01-07 | Discharge: 2020-01-07 | Disposition: A | Discharge status: Other Acute Inpatient Hospital | Payer: Medicare HMO | Attending: Emergency Medicine | Admitting: Emergency Medicine

## 2020-01-07 ENCOUNTER — Encounter: Payer: Self-pay | Admitting: Internal Medicine

## 2020-01-07 ENCOUNTER — Other Ambulatory Visit: Payer: Self-pay

## 2020-01-07 DIAGNOSIS — E039 Hypothyroidism, unspecified: Secondary | ICD-10-CM | POA: Diagnosis present

## 2020-01-07 DIAGNOSIS — R45851 Suicidal ideations: Secondary | ICD-10-CM | POA: Diagnosis present

## 2020-01-07 DIAGNOSIS — Z7984 Long term (current) use of oral hypoglycemic drugs: Secondary | ICD-10-CM | POA: Diagnosis not present

## 2020-01-07 DIAGNOSIS — Z87891 Personal history of nicotine dependence: Secondary | ICD-10-CM | POA: Insufficient documentation

## 2020-01-07 DIAGNOSIS — F411 Generalized anxiety disorder: Secondary | ICD-10-CM | POA: Diagnosis present

## 2020-01-07 DIAGNOSIS — Z79890 Hormone replacement therapy (postmenopausal): Secondary | ICD-10-CM | POA: Diagnosis not present

## 2020-01-07 DIAGNOSIS — F3130 Bipolar disorder, current episode depressed, mild or moderate severity, unspecified: Secondary | ICD-10-CM | POA: Diagnosis present

## 2020-01-07 DIAGNOSIS — Z20822 Contact with and (suspected) exposure to covid-19: Secondary | ICD-10-CM | POA: Diagnosis not present

## 2020-01-07 DIAGNOSIS — Z880 Allergy status to penicillin: Secondary | ICD-10-CM

## 2020-01-07 DIAGNOSIS — I10 Essential (primary) hypertension: Secondary | ICD-10-CM | POA: Diagnosis present

## 2020-01-07 DIAGNOSIS — Z853 Personal history of malignant neoplasm of breast: Secondary | ICD-10-CM | POA: Diagnosis not present

## 2020-01-07 DIAGNOSIS — F3132 Bipolar disorder, current episode depressed, moderate: Secondary | ICD-10-CM | POA: Diagnosis not present

## 2020-01-07 DIAGNOSIS — E1169 Type 2 diabetes mellitus with other specified complication: Secondary | ICD-10-CM

## 2020-01-07 DIAGNOSIS — E119 Type 2 diabetes mellitus without complications: Secondary | ICD-10-CM | POA: Diagnosis present

## 2020-01-07 DIAGNOSIS — Z79899 Other long term (current) drug therapy: Secondary | ICD-10-CM | POA: Diagnosis not present

## 2020-01-07 DIAGNOSIS — E785 Hyperlipidemia, unspecified: Secondary | ICD-10-CM | POA: Diagnosis present

## 2020-01-07 DIAGNOSIS — F4325 Adjustment disorder with mixed disturbance of emotions and conduct: Secondary | ICD-10-CM | POA: Diagnosis present

## 2020-01-07 DIAGNOSIS — F409 Phobic anxiety disorder, unspecified: Secondary | ICD-10-CM | POA: Insufficient documentation

## 2020-01-07 DIAGNOSIS — F4310 Post-traumatic stress disorder, unspecified: Secondary | ICD-10-CM | POA: Diagnosis present

## 2020-01-07 LAB — COMPREHENSIVE METABOLIC PANEL
ALT: 27 U/L (ref 0–44)
AST: 18 U/L (ref 15–41)
Albumin: 4.4 g/dL (ref 3.5–5.0)
Alkaline Phosphatase: 59 U/L (ref 38–126)
Anion gap: 10 (ref 5–15)
BUN: 32 mg/dL — ABNORMAL HIGH (ref 8–23)
CO2: 21 mmol/L — ABNORMAL LOW (ref 22–32)
Calcium: 9.6 mg/dL (ref 8.9–10.3)
Chloride: 104 mmol/L (ref 98–111)
Creatinine, Ser: 1.17 mg/dL — ABNORMAL HIGH (ref 0.44–1.00)
GFR calc Af Amer: 53 mL/min — ABNORMAL LOW (ref >60)
GFR calc non Af Amer: 46 mL/min — ABNORMAL LOW (ref >60)
Glucose, Bld: 189 mg/dL — ABNORMAL HIGH (ref 70–99)
Potassium: 4.3 mmol/L (ref 3.5–5.1)
Sodium: 135 mmol/L (ref 135–145)
Total Bilirubin: 0.6 mg/dL (ref 0.3–1.2)
Total Protein: 8 g/dL (ref 6.5–8.1)

## 2020-01-07 LAB — ETHANOL: Alcohol, Ethyl (B): <10 mg/dL (ref <10)

## 2020-01-07 LAB — URINE DRUG SCREEN, QUALITATIVE (ARMC ONLY)
Amphetamines, Ur Screen: NOT DETECTED
Barbiturates, Ur Screen: NOT DETECTED
Benzodiazepine, Ur Scrn: NOT DETECTED
Cannabinoid 50 Ng, Ur ~~LOC~~: NOT DETECTED
Cocaine Metabolite,Ur ~~LOC~~: NOT DETECTED
MDMA (Ecstasy)Ur Screen: NOT DETECTED
Methadone Scn, Ur: NOT DETECTED
Opiate, Ur Screen: NOT DETECTED
Phencyclidine (PCP) Ur S: NOT DETECTED
Tricyclic, Ur Screen: NOT DETECTED

## 2020-01-07 LAB — CBC WITH DIFFERENTIAL/PLATELET
Abs Immature Granulocytes: 0.06 10*3/uL (ref 0.00–0.07)
Basophils Absolute: 0.1 10*3/uL (ref 0.0–0.1)
Basophils Relative: 1 %
Eosinophils Absolute: 0.2 10*3/uL (ref 0.0–0.5)
Eosinophils Relative: 3 %
HCT: 43.7 % (ref 36.0–46.0)
Hemoglobin: 14.4 g/dL (ref 12.0–15.0)
Immature Granulocytes: 1 %
Lymphocytes Relative: 33 %
Lymphs Abs: 2.9 10*3/uL (ref 0.7–4.0)
MCH: 28.9 pg (ref 26.0–34.0)
MCHC: 33 g/dL (ref 30.0–36.0)
MCV: 87.8 fL (ref 80.0–100.0)
Monocytes Absolute: 0.6 10*3/uL (ref 0.1–1.0)
Monocytes Relative: 7 %
Neutro Abs: 4.8 10*3/uL (ref 1.7–7.7)
Neutrophils Relative %: 55 %
Platelets: 267 10*3/uL (ref 150–400)
RBC: 4.98 MIL/uL (ref 3.87–5.11)
RDW: 12.8 % (ref 11.5–15.5)
WBC: 8.6 10*3/uL (ref 4.0–10.5)
nRBC: 0 % (ref 0.0–0.2)

## 2020-01-07 LAB — GLUCOSE, CAPILLARY
Glucose-Capillary: 302 mg/dL — ABNORMAL HIGH (ref 70–99)
Glucose-Capillary: 194 mg/dL — ABNORMAL HIGH (ref 70–99)

## 2020-01-07 LAB — URINALYSIS, COMPLETE (UACMP) WITH MICROSCOPIC
Bilirubin Urine: NEGATIVE
Glucose, UA: NEGATIVE mg/dL
Hgb urine dipstick: NEGATIVE
Ketones, ur: NEGATIVE mg/dL
Leukocytes,Ua: NEGATIVE
Nitrite: NEGATIVE
Protein, ur: NEGATIVE mg/dL
Specific Gravity, Urine: 1.003 — ABNORMAL LOW (ref 1.005–1.030)
Squamous Epithelial / HPF: NONE SEEN (ref 0–5)
pH: 5 (ref 5.0–8.0)

## 2020-01-07 LAB — TSH: TSH: 0.078 u[IU]/mL — ABNORMAL LOW (ref 0.350–4.500)

## 2020-01-07 LAB — ACETAMINOPHEN LEVEL: Acetaminophen (Tylenol), Serum: <10 ug/mL — ABNORMAL LOW (ref 10–30)

## 2020-01-07 LAB — SARS CORONAVIRUS 2 BY RT PCR (HOSPITAL ORDER, PERFORMED IN ~~LOC~~ HOSPITAL LAB): SARS Coronavirus 2: NEGATIVE

## 2020-01-07 LAB — SALICYLATE LEVEL: Salicylate Lvl: <7 mg/dL — ABNORMAL LOW (ref 7.0–30.0)

## 2020-01-07 MED ORDER — ENALAPRIL MALEATE 10 MG PO TABS
5.0000 mg | ORAL_TABLET | Freq: Two times a day (BID) | ORAL | Status: DC
  Administered 2020-01-07: 5 mg via ORAL
  Filled 2020-01-07: qty 1

## 2020-01-07 MED ORDER — ONDANSETRON HCL 4 MG PO TABS: 4.0000 mg | ORAL_TABLET | Freq: Three times a day (TID) | ORAL | Status: DC | PRN

## 2020-01-07 MED ORDER — FLUOXETINE HCL 20 MG PO CAPS: 60.0000 mg | ORAL_CAPSULE | Freq: Every day | ORAL | Status: DC

## 2020-01-07 MED ORDER — LURASIDONE HCL 40 MG PO TABS
40.0000 mg | ORAL_TABLET | Freq: Every day | ORAL | Status: DC
  Administered 2020-01-07: 40 mg via ORAL
  Filled 2020-01-07: qty 1
  Filled 2020-01-07: qty 2

## 2020-01-07 MED ORDER — TRAZODONE HCL 100 MG PO TABS
100.0000 mg | ORAL_TABLET | Freq: Every day | ORAL | Status: DC
  Administered 2020-01-07: 100 mg via ORAL
  Filled 2020-01-07: qty 1

## 2020-01-07 MED ORDER — ACETAMINOPHEN 325 MG PO TABS: 650.0000 mg | ORAL_TABLET | ORAL | Status: DC | PRN

## 2020-01-07 MED ORDER — LORATADINE 10 MG PO TABS: 10.0000 mg | ORAL_TABLET | Freq: Every day | ORAL | Status: DC

## 2020-01-07 MED ORDER — CLONAZEPAM 0.5 MG PO TABS
1.0000 mg | ORAL_TABLET | Freq: Every day | ORAL | Status: DC
Start: 2020-01-07 — End: 2020-01-07

## 2020-01-07 MED ORDER — GLIPIZIDE ER 5 MG PO TB24
5.0000 mg | ORAL_TABLET | Freq: Two times a day (BID) | ORAL | Status: DC
  Administered 2020-01-07: 5 mg via ORAL
  Filled 2020-01-07 (×2): qty 1

## 2020-01-07 MED ORDER — ROSUVASTATIN CALCIUM 20 MG PO TABS
40.0000 mg | ORAL_TABLET | Freq: Every day | ORAL | Status: DC
  Administered 2020-01-07: 40 mg via ORAL
  Filled 2020-01-07 (×2): qty 2

## 2020-01-07 MED ORDER — LURASIDONE HCL 20 MG PO TABS
20.0000 mg | ORAL_TABLET | Freq: Every day | ORAL | Status: DC
Start: 2020-01-07 — End: 2020-01-07

## 2020-01-07 MED ORDER — LINAGLIPTIN 5 MG PO TABS
5.0000 mg | ORAL_TABLET | Freq: Every day | ORAL | Status: DC
  Filled 2020-01-07 (×2): qty 1

## 2020-01-07 MED ORDER — BENZTROPINE MESYLATE 1 MG PO TABS
0.5000 mg | ORAL_TABLET | Freq: Two times a day (BID) | ORAL | Status: DC
  Administered 2020-01-07: 0.5 mg via ORAL
  Filled 2020-01-07: qty 1

## 2020-01-07 MED ORDER — ACETAMINOPHEN 325 MG PO TABS: 650.0000 mg | ORAL_TABLET | Freq: Four times a day (QID) | ORAL | Status: DC | PRN

## 2020-01-07 MED ORDER — DIVALPROEX SODIUM ER 500 MG PO TB24
1000.0000 mg | ORAL_TABLET | Freq: Every day | ORAL | Status: DC
  Administered 2020-01-07: 1000 mg via ORAL
  Filled 2020-01-07 (×2): qty 2

## 2020-01-07 MED ORDER — ALUM & MAG HYDROXIDE-SIMETH 200-200-20 MG/5ML PO SUSP: 30.0000 mL | Freq: Four times a day (QID) | ORAL | Status: DC | PRN

## 2020-01-07 MED ORDER — CLONAZEPAM 0.5 MG PO TABS
1.0000 mg | ORAL_TABLET | Freq: Two times a day (BID) | ORAL | Status: DC
  Administered 2020-01-07: 1 mg via ORAL
  Filled 2020-01-07: qty 2

## 2020-01-07 MED ORDER — MAGNESIUM HYDROXIDE 400 MG/5ML PO SUSP
30.0000 mL | Freq: Every day | ORAL | Status: DC | PRN
  Administered 2020-01-08: 30 mL via ORAL
  Filled 2020-01-07: qty 30

## 2020-01-07 MED ORDER — GLUCOSE BLOOD VI STRP
1.0000 | ORAL_STRIP | Freq: Two times a day (BID) | Status: DC
  Administered 2020-01-07: 1
  Filled 2020-01-07: qty 1

## 2020-01-07 MED ORDER — LEVOTHYROXINE SODIUM 88 MCG PO TABS
88.0000 ug | ORAL_TABLET | Freq: Every day | ORAL | Status: DC
  Filled 2020-01-07: qty 1

## 2020-01-07 MED ORDER — HALOPERIDOL 2 MG PO TABS
2.0000 mg | ORAL_TABLET | Freq: Two times a day (BID) | ORAL | Status: DC
  Administered 2020-01-07: 2 mg via ORAL
  Filled 2020-01-07 (×2): qty 1

## 2020-01-07 MED ORDER — METFORMIN HCL 500 MG PO TABS: 500.0000 mg | ORAL_TABLET | Freq: Two times a day (BID) | ORAL | Status: DC

## 2020-01-07 NOTE — ED Provider Notes (Signed)
[facility] Emergency Department Provider Note  ____________________________________________  Time seen: Approximately 10:27 AM  I have reviewed the triage vital signs and the nursing notes.   HISTORY  Chief Complaint Psychiatric Evaluation    HPI [name] is a 74 y.o. female with a history of diabetes hypertension hypothyroidism who comes the ED complaining of severe fearfulness which is overwhelming and causing her to feel stressed and lose the motivation to cook, clean her apartment, and do other basics of daily life.  She is particularly fearful of other people, and feels that she cannot talk to others about how she is feeling which is particularly distressing to her.  She does report suicidal thoughts, no particular plan or intention right now.  Denies HI or hallucinations.  Does report a history of drinking antifreeze in a suicide attempt.  Does not believe she has any weapons or medications at home that she could use to harm herself.  She has been compliant with her medications including Prozac 60 mg that was prescribed on her last visit to this emergency department on July 31.  She followed up with her doctor who recently started doxepin and Wellbutrin as well.   No recent increases in her Synthroid dose.     Past Medical History:  Diagnosis Date  . Arthritis   . Breast cancer ([place])   . Cancer [facility]) 2004   right breast ca  . Diabetes mellitus without complication ([place])   . Hypercholesterolemia   . Hypertension   . Hypothyroid   . Seasonal allergies      Patient Active Problem List   Diagnosis Date Noted  . MDD (major depressive disorder) 08/10/2019  . MDD (major depressive disorder), recurrent episode, severe ([place]) 06/24/2019  . Pain due to onychomycosis of toenails of both feet 11/23/2018  . Bipolar 1 disorder, manic, moderate ([place]) 07/21/2018  . Bipolar 1 disorder, mixed, severe ([place]) 07/21/2018  . Bipolar 1 disorder with  moderate mania ([place]) 07/20/2018  . Severe bipolar I disorder, current or most recent episode depressed ([place]) 07/07/2017  . PTSD (post-traumatic stress disorder) 07/07/2017  . 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 Diabetes ([place]) 09/23/2014  . Essential hypertension 09/21/2014  . Hypothyroidism 09/21/2014  . Dyslipidemia 09/21/2014  . Bipolar I disorder, current or most recent episode manic, severe with mixed features ([place]) 09/20/2014     Past Surgical History:  Procedure Laterality Date  . ABDOMINAL HYSTERECTOMY    . BREAST BIOPSY Left    neg  . BREAST EXCISIONAL BIOPSY Right 2004   breast ca and lumpectomy  . BREAST LUMPECTOMY Right 2004   breast ca  . CHOLECYSTECTOMY    . COLONOSCOPY WITH PROPOFOL N/A 02/13/2017   Procedure: COLONOSCOPY WITH PROPOFOL;  Surgeon: Jonathon Bellows, MD;  Location: [facility] ENDOSCOPY;  Service: Gastroenterology;  Laterality: N/A;  . KIDNEY STONE SURGERY       Prior to Admission medications   Medication Sig Start Date End Date Taking? Authorizing Provider  ACCU-CHEK AVIVA PLUS test strip  07/26/19   [provider]  Accu-Chek Softclix Lancets lancets  07/26/19   [provider]  clonazePAM (KLONOPIN) 1 MG tablet Take 1 tablet (1 mg total) by mouth at bedtime. 08/13/19   Money, Lowry Ram, FNP  divalproex (DEPAKOTE ER) 500 MG 24 hr tablet Take 2 tablets (1,000 mg total) by mouth at bedtime. 08/13/19   Money, Lowry Ram, FNP  enalapril (VASOTEC) 5 MG tablet Take 1 tablet (  5 mg total) by mouth 2 (two) times daily. 08/13/19   Money, Lowry Ram, FNP  FLUoxetine (PROZAC) 20 MG capsule Take 3 capsules (60 mg total) by mouth daily. 12/18/19 01/17/20  Rulon Sera, MD  glipiZIDE (GLUCOTROL XL) 5 MG 24 hr tablet Take 1 tablet (5 mg total) by mouth 2 (two) times daily. 08/13/19   Money, Lowry Ram, FNP  levothyroxine (SYNTHROID) 88 MCG tablet Take 1 tablet (88 mcg total) by mouth daily before breakfast. 08/13/19   Money, Lowry Ram,  FNP  linagliptin (TRADJENTA) 5 MG TABS tablet Take 1 tablet (5 mg total) by mouth daily. 08/13/19   Money, Lowry Ram, FNP  loratadine (CLARITIN) 10 MG tablet Take 1 tablet (10 mg total) by mouth daily. 08/13/19   Money, Lowry Ram, FNP  lurasidone (LATUDA) 20 MG TABS tablet Take 1 tablet (20 mg total) by mouth daily with supper. 08/13/19   Money, Lowry Ram, FNP  metFORMIN (GLUCOPHAGE) 500 MG tablet Take 1 tablet (500 mg total) by mouth 2 (two) times daily with a meal. 08/13/19   Money, Lowry Ram, FNP  PAZEO 0.7 % SOLN Take 1 drop by mouth daily.  08/25/18   [provider]  rosuvastatin (CRESTOR) 40 MG tablet Take 1 tablet (40 mg total) by mouth daily. 08/13/19   Money, Lowry Ram, FNP  sulfamethoxazole-trimethoprim (BACTRIM DS) 800-160 MG tablet  09/06/19   [provider]  traZODone (DESYREL) 100 MG tablet Take 1 tablet (100 mg total) by mouth at bedtime. 12/18/19   Rulon Sera, MD     Allergies Navane [thiothixene] and Penicillins   Family History  Problem Relation Age of Onset  . Bladder Cancer Neg Hx   . Kidney cancer Neg Hx     Social History Social History   Tobacco Use  . Smoking status: Former Smoker    Types: Cigarettes  . Smokeless tobacco: Never Used  Vaping Use  . Vaping Use: Never used  Substance Use Topics  . Alcohol use: No  . Drug use: No    Review of Systems  Constitutional:   No fever or chills.  ENT:   No sore throat. No rhinorrhea. Cardiovascular:   No chest pain or syncope. Respiratory:   No dyspnea or cough. Gastrointestinal:   Negative for abdominal pain, vomiting and diarrhea.  Musculoskeletal:   Negative for focal pain or swelling All other systems reviewed and are negative except as documented above in ROS and HPI.  ____________________________________________   PHYSICAL EXAM:  VITAL SIGNS: ED Triage Vitals  Enc Vitals Group     BP 01/07/20 1000 137/78     Pulse Rate 01/07/20 1000 63     Resp 01/07/20 1000 18     Temp 01/07/20  1000 98.7 F (37.1 C)     Temp Source 01/07/20 1000 Oral     SpO2 01/07/20 1000 96 %     Weight 01/07/20 1015 165 lb (74.8 kg)     Height 01/07/20 1015 5\' 1"  (1.549 m)     Head Circumference --      Peak Flow --      Pain Score 01/07/20 1015 0     Pain Loc --      Pain Edu? --      Excl. in [place]? --     Vital signs reviewed, nursing assessments reviewed.   Constitutional:   Alert and oriented. Non-toxic appearance. Eyes:   Conjunctivae are normal. EOMI. PERRL. ENT      Head:  Normocephalic and atraumatic.      Nose:   Wearing a mask.      Mouth/Throat:   Wearing a mask.      Neck:   No meningismus. Full ROM. Hematological/Lymphatic/Immunilogical:   No cervical lymphadenopathy. Cardiovascular:   RRR. Symmetric bilateral radial and DP pulses.  No murmurs. Cap refill less than 2 seconds. Respiratory:   Normal respiratory effort without tachypnea/retractions. Breath sounds are clear and equal bilaterally. No wheezes/rales/rhonchi. Musculoskeletal:   Normal range of motion in all extremities.  Neurologic:   Normal speech and language.  Motor grossly intact. No acute focal neurologic deficits are appreciated.   ____________________________________________    LABS (pertinent positives/negatives) (all labs ordered are listed, but only abnormal results are displayed) Labs Reviewed  ACETAMINOPHEN LEVEL - Abnormal; Notable for the following components:      Result Value   Acetaminophen (Tylenol), Serum <10 (*)    All other components within normal limits  COMPREHENSIVE METABOLIC PANEL - Abnormal; Notable for the following components:   CO2 21 (*)    Glucose, Bld 189 (*)    BUN 32 (*)    Creatinine, Ser 1.17 (*)    GFR calc non Af Amer 46 (*)    GFR calc Af Amer 53 (*)    All other components within normal limits  SALICYLATE LEVEL - Abnormal; Notable for the following components:   Salicylate Lvl <2.9 (*)    All other components within normal limits  URINALYSIS, COMPLETE  (UACMP) WITH MICROSCOPIC - Abnormal; Notable for the following components:   Color, Urine COLORLESS (*)    APPearance CLEAR (*)    Specific Gravity, Urine 1.003 (*)    Bacteria, UA RARE (*)    All other components within normal limits  TSH - Abnormal; Notable for the following components:   TSH 0.078 (*)    All other components within normal limits  SARS CORONAVIRUS 2 BY RT PCR (HOSPITAL ORDER, [facility])  ETHANOL  CBC WITH DIFFERENTIAL/PLATELET  URINE DRUG SCREEN, QUALITATIVE ([facility] ONLY)   ____________________________________________   EKG    ____________________________________________    RADIOLOGY  No results found.  ____________________________________________   PROCEDURES Procedures  ____________________________________________    CLINICAL IMPRESSION / ASSESSMENT AND PLAN / ED COURSE  Medications ordered in the ED: Medications  acetaminophen (TYLENOL) tablet 650 mg (has no administration in time range)  ondansetron (ZOFRAN) tablet 4 mg (has no administration in time range)  alum & mag hydroxide-simeth (MAALOX/MYLANTA) 200-200-20 MG/5ML suspension 30 mL (has no administration in time range)    Pertinent labs & imaging results that were available during my care of the patient were reviewed by me and considered in my medical decision making (see chart for details).  [name] was evaluated in Emergency Department on 01/07/2020 for the symptoms described in the history of present illness. She was evaluated in the context of the global COVID-19 pandemic, which necessitated consideration that the patient might be at risk for infection with the SARS-CoV-2 virus that causes COVID-19. Institutional protocols and algorithms that pertain to the evaluation of patients at risk for COVID-19 are in a state of rapid change based on information released by regulatory bodies including the CDC and federal and state organizations. These  policies and algorithms were followed during the patient's care in the ED.   Patient presents with fearfulness.  No evidence of psychosis.  I do not find a cause for elevated safety concern, does not meet IVC criteria  right now.  I will consult psychiatry due to her worsening symptoms.  Clinical Course as of Jan 07 1308  Fri Jan 07, 2020  1308 D/W psychiatry who plans for gero-psych referral.   [PS]    Clinical Course User Index [PS] Carrie Mew, MD   The patient has been placed in psychiatric observation due to the need to provide a safe environment for the patient while obtaining psychiatric consultation and evaluation, as well as ongoing medical and medication management to treat the patient's condition.  The patient has not been placed under full IVC at this time.   ____________________________________________   FINAL CLINICAL IMPRESSION(S) / ED DIAGNOSES    Final diagnoses:  [place]     ED Discharge Orders    None      Portions of this note were generated with dragon dictation software. Dictation errors may occur despite best attempts at proofreading.   Carrie Mew, MD 01/07/20 1309

## 2020-01-07 NOTE — Plan of Care (Signed)
Patient new to the unit, hasn't had time to progress  Problem: Education: Goal: Knowledge of [facility] Education information/materials will improve Outcome: Not Progressing Goal: Emotional status will improve Outcome: Not Progressing Goal: Mental status will improve Outcome: Not Progressing Goal: Verbalization of understanding the information provided will improve Outcome: Not Progressing   Problem: Safety: Goal: Periods of time without injury will increase Outcome: Not Progressing   Problem: Education: Goal: Utilization of techniques to improve thought processes will improve Outcome: Not Progressing Goal: Knowledge of the prescribed therapeutic regimen will improve Outcome: Not Progressing   Problem: Safety: Goal: Ability to disclose and discuss suicidal ideas will improve Outcome: Not Progressing Goal: Ability to identify and utilize support systems that promote safety will improve Outcome: Not Progressing   

## 2020-01-07 NOTE — ED Notes (Signed)
Pt received a phone call from a correspondent, heard stating that the way she is living currently is existing, not living

## 2020-01-07 NOTE — ED Notes (Signed)
Hourly rounding reveals patient awake in hall bed. No complaints, stable, in no acute distress. Q15 minute rounds and monitoring via [facility] and Officer to continue.  

## 2020-01-07 NOTE — ED Triage Notes (Signed)
Pt arrives via ems from her apt at [address] [name], [name]  Pt states that she is just full of fear, she feels a little better currently but states that she has been consumed by fear her entire life and is usually able to talk herself down, but just wasn't able to today, she also states that she has a hx of overdosing on her medications as well as drinking antifreeze, pt states that she pushes the thoughts of SI away and doesn't let herself get to that point, pt states that she is unsure if she wouldn't be better off if she could commit SI, but has been unsuccessful in the past and that she just can't do anything right. Pt states that her children live in [place] and [place], but are estranged, states that her brother and sister in law live in [place], but are also estranged.

## 2020-01-07 NOTE — ED Notes (Signed)
Pt provided with graham crackers, saltine crackers, and diet sprite

## 2020-01-07 NOTE — ED Triage Notes (Signed)
Brought by ems from home.  Says she has suicidal thoughts every day with one previous attempt.  She called ems today because she was scared and anxious..  Thinks it may be because she is on couple new meds started couple days ago.

## 2020-01-07 NOTE — ED Notes (Signed)
Dr Joni Fears at bedside

## 2020-01-07 NOTE — BH Assessment (Addendum)
Assessment Note  [name] is an 74 y.o. female who presents to the ER due to increase symptoms of depression and anxiety. Per the patient, she has dealt with this for majority of her life. However, due to the increase symptoms, she is now having the thoughts of ending her life. She states, she's no longer living, she's only existing. She further reports, she's afraid to leave her home, she's in constant fear of someone hurting her, even though she knows it isn't true. She states she's complaint with her mental health outpatient treatment and recently seen her psychiatrist and had a medication change. However, the symptoms have increase. She states she's in constant panic, fear and terror.  During the interview, the patient was cooperative and pleasant. She was able to provide appropriate answers to the questions. She denies HI and AV/H. She also denies the use of any mind-altering substances. She has no history of violence or aggression and no involvement with the legal system.  Diagnosis: Bipolar  Past Medical History:  Past Medical History:  Diagnosis Date  . Arthritis   . Breast cancer ([place])   . [facility]) 2004   right breast ca  . Diabetes mellitus without complication ([place])   . Hypercholesterolemia   . Hypertension   . Hypothyroid   . Seasonal allergies     Past Surgical History:  Procedure Laterality Date  . ABDOMINAL HYSTERECTOMY    . BREAST BIOPSY Left    neg  . BREAST EXCISIONAL BIOPSY Right 2004   breast ca and lumpectomy  . BREAST LUMPECTOMY Right 2004   breast ca  . CHOLECYSTECTOMY    . COLONOSCOPY WITH PROPOFOL N/A 02/13/2017   Procedure: COLONOSCOPY WITH PROPOFOL;  Surgeon: Jonathon Bellows, MD;  Location: [facility] ENDOSCOPY;  Service: Gastroenterology;  Laterality: N/A;  . KIDNEY STONE SURGERY      Family History:  Family History  Problem Relation Age of Onset  . Bladder Cancer Neg Hx   . Kidney cancer Neg Hx     Social History:  reports that she has  quit smoking. Her smoking use included cigarettes. She has never used smokeless tobacco. She reports that she does not drink alcohol and does not use drugs.  Additional Social History:  Alcohol / Drug Use Pain Medications: See PTA Prescriptions: See PTA Over the Counter: See PTA History of alcohol / drug use?: No history of alcohol / drug abuse Longest period of sobriety (when/how long): n/a  CIWA: CIWA-Ar BP: 137/78 Pulse Rate: 63 COWS:    Allergies:  Allergies  Allergen Reactions  . Navane [Thiothixene] Other (See Comments)    Reaction:  Unknown  . Penicillins Rash    Has patient had a PCN reaction causing immediate rash, facial/tongue/throat swelling, SOB or lightheadedness with hypotension: No Has patient had a PCN reaction causing severe rash involving mucus membranes or skin necrosis: No Has patient had a PCN reaction that required hospitalization: No Has patient had a PCN reaction occurring within the last 10 years: No If all of the above answers are "NO", then may proceed with Cephalosporin use.     Home Medications: (Not in a hospital admission)   OB/GYN Status:  No LMP recorded. Patient has had a hysterectomy.  General Assessment Data Location of Assessment: [facility] ED TTS Assessment: In system Is this a Tele or Face-to-Face Assessment?: Face-to-Face Is this an Initial Assessment or a Re-assessment for this encounter?: Initial Assessment Patient Accompanied by:: N/A Language Other than English: No Living Arrangements: Other (  Comment) (Private Home) What gender do you identify as?: Female Date Telepsych consult ordered in [facility]: 01/07/20 Time Telepsych consult ordered in [facility]: 1211 Marital status: Divorced Pregnancy Status: No Living Arrangements: Alone Can pt return to current living arrangement?: Yes Admission Status: Voluntary Is patient capable of signing voluntary admission?: Yes Referral Source: Self/Family/Friend Insurance type: [employer] Medicare  Medical  Screening Exam ([place]) Medical Exam completed: Yes  Crisis Care Plan Living Arrangements: Alone Legal Guardian: Other: (Self) Name of Psychiatrist: [place] Name of Therapist: [facility]  Education Status Is patient currently in school?: No Is the patient employed, unemployed or receiving disability?: Unemployed  Risk to self with the past 6 months Suicidal Ideation: Yes-Currently Present Has patient been a risk to self within the past 6 months prior to admission? : No Suicidal Intent: No Has patient had any suicidal intent within the past 6 months prior to admission? : No Is patient at risk for suicide?: No Suicidal Plan?: No Has patient had any suicidal plan within the past 6 months prior to admission? : No Access to Means: No What has been your use of drugs/alcohol within the last 12 months?: Reports of none Previous Attempts/Gestures: No How many times?: 0 Other Self Harm Risks: Reports of none Triggers for Past Attempts: None known Intentional Self Injurious Behavior: None Family Suicide History: No Recent stressful life event(s): Other (Comment) Persecutory voices/beliefs?: No Depression: Yes Depression Symptoms: Isolating, Loss of interest in usual pleasures, Feeling worthless/self pity Substance abuse history and/or treatment for substance abuse?: No Suicide prevention information given to non-admitted patients: Not applicable  Risk to Others within the past 6 months Homicidal Ideation: No Does patient have any lifetime risk of violence toward others beyond the six months prior to admission? : No Thoughts of Harm to Others: No Current Homicidal Intent: No Current Homicidal Plan: No Access to Homicidal Means: No Identified Victim: Reports of none History of harm to others?: No Assessment of Violence: None Noted Violent Behavior Description: Reports of none Does patient have access to weapons?: No Criminal Charges Pending?: No Does patient have a court date:  No Is patient on probation?: No  Psychosis Hallucinations: None noted Delusions: None noted  Mental Status Report Appearance/Hygiene: Unremarkable, In scrubs Eye Contact: Good Motor Activity: Freedom of movement, Unremarkable Speech: Logical/coherent, Unremarkable Level of Consciousness: Alert Mood: Anxious, Sad, Helpless, Pleasant Affect: Appropriate to circumstance, Anxious Anxiety Level: Moderate Thought Processes: Coherent, Relevant Judgement: Unimpaired Orientation: Person, Place, Time, Situation, Appropriate for developmental age Obsessive Compulsive Thoughts/Behaviors: None  Cognitive Functioning Concentration: Normal Memory: Recent Intact, Remote Intact Is patient IDD: No Insight: Good Impulse Control: Fair Appetite: Good Have you had any weight changes? : No Change Sleep: No Change Total Hours of Sleep: 8 Vegetative Symptoms: None  ADLScreening [facility] Assessment Services) Patient's cognitive ability adequate to safely complete daily activities?: Yes Patient able to express need for assistance with ADLs?: Yes Independently performs ADLs?: Yes (appropriate for developmental age)  Prior Inpatient Therapy Prior Inpatient Therapy: Yes Prior Therapy Dates: Multiple Hospitalizations Prior Therapy Facilty/Provider(s): [place] Reason for Treatment: Bipolar Depression  Prior Outpatient Therapy Prior Outpatient Therapy: Yes Prior Therapy Dates: Current Prior Therapy Facilty/Provider(s): [facility] Reason for Treatment: Bipolar Does patient have an ACCT team?: No Does patient have Intensive In-House Services?  : No Does patient have [facility] services? : No Does patient have P4CC services?: No  ADL Screening (condition at time of admission) Patient's cognitive ability adequate to safely complete daily activities?: Yes Is the  patient deaf or have difficulty hearing?: No Does the patient have difficulty seeing, even when wearing glasses/contacts?: No Does  the patient have difficulty concentrating, remembering, or making decisions?: No Patient able to express need for assistance with ADLs?: Yes Does the patient have difficulty dressing or bathing?: No Independently performs ADLs?: Yes (appropriate for developmental age) Does the patient have difficulty walking or climbing stairs?: No Weakness of Legs: None Weakness of Arms/Hands: None  Home Assistive Devices/Equipment Home Assistive Devices/Equipment: None  Therapy Consults (therapy consults require a physician order) PT Evaluation Needed: No OT Evalulation Needed: No SLP Evaluation Needed: No Abuse/Neglect Assessment (Assessment to be complete while patient is alone) Abuse/Neglect Assessment Can Be Completed: Yes Physical Abuse: Denies Verbal Abuse: Denies Sexual Abuse: Denies Exploitation of patient/patient's resources: Denies Self-Neglect: Denies Values / Beliefs Cultural Requests During Hospitalization: None Spiritual Requests During Hospitalization: None Consults Spiritual Care Consult Needed: No Transition of Care Team Consult Needed: No Advance Directives (For Healthcare) Does Patient Have a Medical Advance Directive?: No Would patient like information on creating a medical advance directive?: No - Patient declined  Disposition:  Disposition Initial Assessment Completed for this Encounter: Yes  On Site Evaluation by:   Reviewed with Physician:    Gunnar Fusi MS, LCAS, [facility], [place] Therapeutic Triage Specialist 01/07/2020 12:27 PM

## 2020-01-07 NOTE — BH Assessment (Signed)
Patient is to be admitted to [facility] by Dr. Weber Cooks.  Attending Physician will be Dr. Weber Cooks.   Patient has been assigned to room 301, by [place], RN.   Intake Paper Work has been signed and placed on patient chart.  ER staff is aware of the admission:  Vaughan Basta, ER Secretary    Dr. Tamala Julian, ER MD   Dominica Severin Patient's Nurse

## 2020-01-07 NOTE — ED Notes (Signed)
Pt up to the toilet, ambulatory with out difficulty

## 2020-01-07 NOTE — ED Notes (Signed)
Dr Marlou Starks at bedside

## 2020-01-07 NOTE — ED Notes (Signed)
Hourly rounding reveals patient sleeping in hall bed. No complaints, stable, in no acute distress. Q15 minute rounds and monitoring via Rover and Officer to continue.  
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
## 2020-01-07 NOTE — ED Notes (Signed)
Report to include Situation, Background, Assessment, and Recommendations received from [facility]. Patient alert, warm and dry, in no acute distress. Patient denies SI, HI, AVH and pain. Patient made aware of Q15 minute rounds and security cameras for their safety. Patient instructed to come to me with needs or concerns.

## 2020-01-08 LAB — GLUCOSE, CAPILLARY: Glucose-Capillary: 315 mg/dL — ABNORMAL HIGH (ref 70–99)

## 2020-01-08 MED ORDER — CLONAZEPAM 1 MG PO TABS
1.0000 mg | ORAL_TABLET | Freq: Two times a day (BID) | ORAL | Status: DC
  Administered 2020-01-08 – 2020-01-11 (×6): 1 mg via ORAL
  Filled 2020-01-08 (×6): qty 1

## 2020-01-08 MED ORDER — GLIPIZIDE ER 5 MG PO TB24
5.0000 mg | ORAL_TABLET | Freq: Two times a day (BID) | ORAL | Status: DC
  Administered 2020-01-08 – 2020-01-10 (×4): 5 mg via ORAL
  Filled 2020-01-08 (×4): qty 1

## 2020-01-08 MED ORDER — ENALAPRIL MALEATE 5 MG PO TABS
5.0000 mg | ORAL_TABLET | Freq: Two times a day (BID) | ORAL | Status: DC
  Administered 2020-01-08 – 2020-01-11 (×5): 5 mg via ORAL
  Filled 2020-01-08 (×7): qty 1

## 2020-01-08 MED ORDER — LEVOTHYROXINE SODIUM 88 MCG PO TABS
88.0000 ug | ORAL_TABLET | Freq: Every day | ORAL | Status: DC
  Administered 2020-01-09 – 2020-01-10 (×2): 88 ug via ORAL
  Filled 2020-01-08 (×2): qty 1

## 2020-01-08 MED ORDER — LORATADINE 10 MG PO TABS
10.0000 mg | ORAL_TABLET | Freq: Every day | ORAL | Status: DC
  Administered 2020-01-08 – 2020-01-11 (×4): 10 mg via ORAL
  Filled 2020-01-08 (×4): qty 1

## 2020-01-08 MED ORDER — FLUOXETINE HCL 20 MG PO CAPS
60.0000 mg | ORAL_CAPSULE | Freq: Every day | ORAL | Status: DC
  Administered 2020-01-08 – 2020-01-11 (×4): 60 mg via ORAL
  Filled 2020-01-08 (×4): qty 3

## 2020-01-08 MED ORDER — CLONAZEPAM 1 MG PO TABS
1.0000 mg | ORAL_TABLET | Freq: Every day | ORAL | Status: DC
Start: 2020-01-08 — End: 2020-01-08

## 2020-01-08 MED ORDER — LINAGLIPTIN 5 MG PO TABS
5.0000 mg | ORAL_TABLET | Freq: Every day | ORAL | Status: DC
  Administered 2020-01-08 – 2020-01-10 (×3): 5 mg via ORAL
  Filled 2020-01-08 (×3): qty 1

## 2020-01-08 MED ORDER — HALOPERIDOL 1 MG PO TABS
0.5000 mg | ORAL_TABLET | Freq: Two times a day (BID) | ORAL | Status: DC
  Administered 2020-01-08 – 2020-01-10 (×4): 0.5 mg via ORAL
  Filled 2020-01-08 (×4): qty 1

## 2020-01-08 MED ORDER — QUETIAPINE FUMARATE 25 MG PO TABS
12.5000 mg | ORAL_TABLET | Freq: Every day | ORAL | Status: DC
  Administered 2020-01-08: 12.5 mg via ORAL
  Filled 2020-01-08: qty 1

## 2020-01-08 MED ORDER — DIVALPROEX SODIUM ER 500 MG PO TB24
1000.0000 mg | ORAL_TABLET | Freq: Every day | ORAL | Status: DC
  Administered 2020-01-08 – 2020-01-10 (×3): 1000 mg via ORAL
  Filled 2020-01-08 (×3): qty 2

## 2020-01-08 NOTE — Progress Notes (Signed)
D: Pt alert and oriented x 4. Pt rates depression 3/10, hopelessness 8/10, and anxiety 8/10.Pt goal: "feeling useful and overcoming fear." Pt reports energy level as low and concentration as being poor. Pt reports sleep last night as being good. Pt did not receive medications for sleep. Pt denies experiencing any pain at this time. Pt denies experiencing any SI/HI, or AVH at this time.   A: Scheduled medications administered to pt, per MD orders. Support and encouragement provided. Frequent verbal contact made. Routine safety checks conducted q15 minutes.   R: No adverse drug reactions noted. Pt verbally contracts for safety at this time. Pt complaint with medications and treatment plan. Pt interacts well with others on the unit. Pt remains safe at this time. Will continue to monitor.

## 2020-01-08 NOTE — [facility] Group Notes (Signed)
[facility] Group Notes: (Clinical Social Work)   01/08/2020      Type of Therapy:  Group Therapy   Participation Level:  Did Not Attend - was invited individually by Nurse/MHT and chose not to attend.   Raina Mina, [place] 01/08/2020  3:32 PM
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
## 2020-01-08 NOTE — BHH Suicide Risk Assessment (Signed)
[facility] Admission Suicide Risk Assessment   Nursing information obtained from:  Patient Demographic factors:  Age 74 or older, Living alone Current Mental Status:  Suicidal ideation indicated by patient Loss Factors:  NA Historical Factors:  NA Risk Reduction Factors:  NA  Total Time spent with patient: 30-40  Principal Problem: <principal problem not specified> Diagnosis:  Active Problems:   Bipolar disorder current episode depressed ([place]) with psychosis     And also ----PTSD  Subjective Data:   Caucasian female with above issues needs inpatient structure support stabilization   Continued Clinical Symptoms:  Alcohol Use Disorder Identification Test Final Score (AUDIT): 0 The "Alcohol Use Disorders Identification Test", Guidelines for Use in Primary Care, Second Edition.  World Pharmacologist [facility]). Score between 0-7:  no or low risk or alcohol related problems. Score between 8-15:  moderate risk of alcohol related problems. Score between 16-19:  high risk of alcohol related problems. Score 20 or above:  warrants further diagnostic evaluation for alcohol dependence and treatment.   CLINICAL FACTORS:    \PTSD< depression anxiety   Mental status and factors already duplicated in the initial evaluation same day                                 COGNITIVE FEATURES THAT CONTRIBUTE TO RISK:   Fear worry, tension, PTSD, nightmares, traumatic memories   SUICIDE RISK:    Low to moderate low   Has only Passive SI no active plans   PLAN OF CARE:   Already written in the H and P    I certify that inpatient services furnished can reasonably be expected to improve the patient's condition.   Eulas Post, MD 01/08/2020, 11:57 AM

## 2020-01-08 NOTE — Progress Notes (Signed)
Patient calm and pleasant during assessment denying SI/HI/AVH and pain. Patient endorsees anxiety and depression stating, "I have always felt this way. I just fee worthless." Patient isolative to her room this evening, stating, "I am just so tired today. I want to sleep." Patient given education, support, and encouragement to be active in her treatment plan. Patient compliant with medication administration per MD orders. Patient being monitored Q 15 minutes for safety per unit protocol. Pt remains safe on the unit.

## 2020-01-08 NOTE — Plan of Care (Signed)
Patient stated she was feeling better today.   Problem: Education: Goal: Emotional status will improve Outcome: Progressing Goal: Mental status will improve Outcome: Progressing

## 2020-01-08 NOTE — Tx Team (Signed)
Initial Treatment Plan 01/08/2020 4:23 AM [name] EFE:[id number]    PATIENT STRESSORS: Medication change or noncompliance Substance abuse Traumatic event   PATIENT STRENGTHS: Ability for insight Motivation for treatment/growth   PATIENT IDENTIFIED PROBLEMS: Suicidal ideation     Bipolar 1 disorder       Depression            DISCHARGE CRITERIA:  Improved stabilization in mood, thinking, and/or behavior Motivation to continue treatment in a less acute level of care  PRELIMINARY DISCHARGE PLAN: Outpatient therapy  PATIENT/FAMILY INVOLVEMENT: This treatment plan has been presented to and reviewed with the patient, [name],  The patient and family have been given the opportunity to ask questions and make suggestions.  Harl Bowie, RN 01/08/2020, 4:23 AM
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
## 2020-01-08 NOTE — Progress Notes (Signed)
Admission Note:  74 yr female who presents IVC in no acute distress for the treatment of  Depression.and anxiety  Patient  appears flat but brightens upon approach, she's  known on the unit to staff, she was calm and cooperative with admission process, she currently denies SI/HI/AVH and contracts for safety upon admission. Patient stated she experienced worsening depression and overwhelming anxiety.   Patient has Past medical Hx of Arthritis, Breast Cancer, HTN, Hypothyroid.   and DM without complication . Patient's skin was assessed and found to be clear of any abnormal marks also she ws searched and no contraband found, POC and unit policies explained, understanding verbalized and consents obtained. Patient was offered emotional support. 15 minutes safety checks maintained will continue to monitor

## 2020-01-09 LAB — GLUCOSE, CAPILLARY: Glucose-Capillary: 225 mg/dL — ABNORMAL HIGH (ref 70–99)

## 2020-01-09 MED ORDER — QUETIAPINE FUMARATE 25 MG PO TABS
25.0000 mg | ORAL_TABLET | Freq: Every day | ORAL | Status: DC
  Administered 2020-01-09: 25 mg via ORAL
  Filled 2020-01-09: qty 1

## 2020-01-09 NOTE — Progress Notes (Signed)
[facility] MD Progress Note  01/09/2020 12:43 PM [name]  MRN:  [id number] Subjective:  I am feeling better   Principal Problem: <principal problem not specified> Diagnosis: Active Problems:   Bipolar disorder current episode depressed ([place])  Generalized anxiety  PTSD     Total Time spent with patient: --20-30    Past Psychiatric History:   Already discussed   Past Medical History:  Past Medical History:  Diagnosis Date  . Arthritis   . Breast cancer ([place])   . Cancer [facility]) 2004   right breast ca  . Diabetes mellitus without complication ([place])   . Hypercholesterolemia   . Hypertension   . Hypothyroid   . Seasonal allergies     Past Surgical History:  Procedure Laterality Date  . ABDOMINAL HYSTERECTOMY    . BREAST BIOPSY Left    neg  . BREAST EXCISIONAL BIOPSY Right 2004   breast ca and lumpectomy  . BREAST LUMPECTOMY Right 2004   breast ca  . CHOLECYSTECTOMY    . COLONOSCOPY WITH PROPOFOL N/A 02/13/2017   Procedure: COLONOSCOPY WITH PROPOFOL;  Surgeon: Jonathon Bellows, MD;  Location: [facility] ENDOSCOPY;  Service: Gastroenterology;  Laterality: N/A;  . KIDNEY STONE SURGERY     Family History:  Family History  Problem Relation Age of Onset  . Bladder Cancer Neg Hx   . Kidney cancer Neg Hx    Family Psychiatric  History:   Already discussed  Social History:  Social History   Substance and Sexual Activity  Alcohol Use No     Social History   Substance and Sexual Activity  Drug Use No    Social History   Socioeconomic History  . Marital status: Divorced    Spouse name: Not on file  . Number of children: Not on file  . Years of education: Not on file  . Highest education level: Not on file  Occupational History  . Not on file  Tobacco Use  . Smoking status: Former Smoker    Types: Cigarettes  . Smokeless tobacco: Never Used  Vaping Use  . Vaping Use: Never used  Substance and Sexual Activity  . Alcohol use: No  . Drug use: No  . Sexual  activity: Not Currently  Other Topics Concern  . Not on file  Social History Narrative  . Not on file   Social Determinants of Health   Financial Resource Strain:   . Difficulty of Paying Living Expenses: Not on file  Food Insecurity:   . Worried About Charity fundraiser in the Last Year: Not on file  . Ran Out of Food in the Last Year: Not on file  Transportation Needs:   . Lack of Transportation (Medical): Not on file  . Lack of Transportation (Non-Medical): Not on file  Physical Activity:   . Days of Exercise per Week: Not on file  . Minutes of Exercise per Session: Not on file  Stress:   . Feeling of Stress : Not on file  Social Connections:   . Frequency of Communication with Friends and Family: Not on file  . Frequency of Social Gatherings with Friends and Family: Not on file  . Attends Religious Services: Not on file  . Active Member of Clubs or Organizations: Not on file  . Attends Archivist Meetings: Not on file  . Marital Status: Not on file   Additional Social History:    Sleep:  Better   Appetite:  Improving   Current Medications: Current  Facility-Administered Medications  Medication Dose Route Frequency Provider Last Rate Last Admin  . acetaminophen (TYLENOL) tablet 650 mg  650 mg Oral Q6H PRN Eulas Post, MD      . clonazePAM Bobbye [place]) tablet 1 mg  1 mg Oral BID Eulas Post, MD   1 mg at 01/09/20 0843  . divalproex (DEPAKOTE ER) 24 hr tablet 1,000 mg  1,000 mg Oral QHS Eulas Post, MD   1,000 mg at 01/08/20 2114  . enalapril (VASOTEC) tablet 5 mg  5 mg Oral BID Eulas Post, MD   5 mg at 01/09/20 0844  . FLUoxetine (PROZAC) capsule 60 mg  60 mg Oral Daily Eulas Post, MD   60 mg at 01/09/20 0843  . glipiZIDE (GLUCOTROL XL) 24 hr tablet 5 mg  5 mg Oral BID Eulas Post, MD   5 mg at 01/09/20 0843  . haloperidol (HALDOL) tablet 0.5 mg  0.5 mg Oral BID Eulas Post, MD   0.5 mg at 01/09/20 0844  . levothyroxine  (SYNTHROID) tablet 88 mcg  88 mcg Oral QAC breakfast Eulas Post, MD   88 mcg at 01/09/20 [phone]  . linagliptin (TRADJENTA) tablet 5 mg  5 mg Oral Daily Eulas Post, MD   5 mg at 01/09/20 0843  . loratadine (CLARITIN) tablet 10 mg  10 mg Oral Daily Eulas Post, MD   10 mg at 01/09/20 0844  . magnesium hydroxide (MILK OF MAGNESIA) suspension 30 mL  30 mL Oral Daily PRN Eulas Post, MD   30 mL at 01/08/20 0820  . QUEtiapine (SEROQUEL) tablet 25 mg  25 mg Oral QHS Eulas Post, MD        Lab Results:  Results for orders placed or performed during the hospital encounter of 01/07/20 (from the past 48 hour(s))  Glucose, capillary     Status: Abnormal   Collection Time: 01/08/20 11:21 AM  Result Value Ref Range   Glucose-Capillary 315 (H) 70 - 99 mg/dL    Comment: Glucose reference range applies only to samples taken after fasting for at least 8 hours.  Glucose, capillary     Status: Abnormal   Collection Time: 01/09/20  6:56 AM  Result Value Ref Range   Glucose-Capillary 225 (H) 70 - 99 mg/dL    Comment: Glucose reference range applies only to samples taken after fasting for at least 8 hours.    Blood Alcohol level:  Lab Results  Component Value Date   ETH <10 01/07/2020   ETH <10 24/58/0998    Metabolic Disorder Labs: Lab Results  Component Value Date   HGBA1C 9.6 (H) 07/21/2018   MPG 228.82 07/21/2018   MPG 240 07/07/2018   No results found for: PROLACTIN Lab Results  Component Value Date   CHOL 142 08/11/2019   TRIG 236 (H) 08/11/2019   HDL 43 08/11/2019   CHOLHDL 3.3 08/11/2019   VLDL 47 (H) 08/11/2019   LDLCALC 52 08/11/2019   [place] 75 07/21/2018    Physical Findings:   Not done yet  AIMS:  , ,  ,  ,    CIWA:    COWS:     Musculoskeletal: Strength & Muscle Tone:    Gait & Station:   More normal  Patient leans: --not applicable  Cognition --at times limited Recall ---okay  ADL's okay  Aims not yet  Sleep okay  Language normal   Assets --wants to now go to [facility]    Psychiatric Specialty Exam: Physical Exam  Review of Systems  Blood  pressure 105/86, pulse 73, temperature 98.6 F (37 C), temperature source Oral, resp. rate 18, height 5\' 1"  (1.549 m), weight 74.8 kg, SpO2 97 %.Body mass index is 31.16 kg/m.  Mental Status   Alert cooperative oriented times four Consciousness not clouded or fluctuant Mood improving anxiety better Affect better Ambulation better No tics shakes tremors Concentration and attention /normal  SI and HI better Judgement insight reliability --okay  Memory improving remote and recent Psychomotor agitation less Speech less loud and pressured Thought Process / and Content ---better with depressive and anxiety themes Fund of knowledge and intelligence ---normal  Appearance normal [place] better  Eye contact                                                        Treatment Plan Summary:  Patient remains on unit for stabilization we did treatment ---team and she seeks NH papers      Eulas Post, MD 01/09/2020, 12:43 PM

## 2020-01-09 NOTE — BHH Suicide Risk Assessment (Signed)
[place] INPATIENT:  Family/Significant Other Suicide Prevention Education  Suicide Prevention Education:  Patient Refusal for Family/Significant Other Suicide Prevention Education: The patient [name] has refused to provide written consent for family/significant other to be provided Family/Significant Other Suicide Prevention Education during admission and/or prior to discharge.  Physician notified.  Larene Beach  Umar Patmon 01/09/2020, 12:08 PM

## 2020-01-09 NOTE — Plan of Care (Signed)
D- Patient alert and oriented. Patient presents in a pleasant mood on assessment stating that she slept alright last night and had no complaints to voice to this Probation officer. Patient denies SI, HI, AVH, and pain at this time. Patient also denies any signs/symptoms of depression/anxiety stating that "the medication is really working,I can't believe it. I feel much better, I feel like me, I feel normal". Patient's goal for today is to "work on feeling better and maybe get some exercise".  A- Scheduled medications administered to patient, per MD orders. Support and encouragement provided.  Routine safety checks conducted every 15 minutes.  Patient informed to notify staff with problems or concerns.  R- No adverse drug reactions noted. Patient contracts for safety at this time. Patient compliant with medications and treatment plan. Patient receptive, calm, and cooperative. Patient interacts well with others on the unit.  Patient remains safe at this time.  Problem: Education: Goal: Knowledge of [facility] Education information/materials will improve Outcome: Progressing Goal: Emotional status will improve Outcome: Progressing Goal: Mental status will improve Outcome: Progressing Goal: Verbalization of understanding the information provided will improve Outcome: Progressing   Problem: Safety: Goal: Periods of time without injury will increase Outcome: Progressing   Problem: Education: Goal: Utilization of techniques to improve thought processes will improve Outcome: Progressing Goal: Knowledge of the prescribed therapeutic regimen will improve Outcome: Progressing   Problem: Safety: Goal: Ability to disclose and discuss suicidal ideas will improve Outcome: Progressing Goal: Ability to identify and utilize support systems that promote safety will improve Outcome: Progressing

## 2020-01-09 NOTE — BHH Group Notes (Signed)
[place] Group Notes:  (Nursing/MHT/Case Management/Adjunct)  Date:  01/09/2020  Time:  9:09 PM  Type of Therapy:  Group Therapy  Participation Level:  Did Not Attend  [name] 01/09/2020, 9:09 PM

## 2020-01-09 NOTE — BHH Group Notes (Signed)
[place] Group Notes: (Clinical Social Work)   01/09/2020      Type of Therapy:  Group Therapy   Participation Level:  Did Not Attend - was invited individually by Nurse/MHT and chose not to attend.   Raina Mina, [place] 01/09/2020  2:53 PM

## 2020-01-09 NOTE — Tx Team (Signed)
Interdisciplinary Treatment and Diagnostic Plan Update  01/09/2020 Time of Session: 10:45 AM  [name] MRN: [id number]  Principal Diagnosis: <principal problem not specified>  Secondary Diagnoses: Active Problems:   Bipolar disorder current episode depressed ([facility])   Current Medications:  Current Facility-Administered Medications  Medication Dose Route Frequency Provider Last Rate Last Admin  . acetaminophen (TYLENOL) tablet 650 mg  650 mg Oral Q6H PRN Eulas Post, MD      . clonazePAM Bobbye [place]) tablet 1 mg  1 mg Oral BID Eulas Post, MD   1 mg at 01/09/20 0843  . divalproex (DEPAKOTE ER) 24 hr tablet 1,000 mg  1,000 mg Oral QHS Eulas Post, MD   1,000 mg at 01/08/20 2114  . enalapril (VASOTEC) tablet 5 mg  5 mg Oral BID Eulas Post, MD   5 mg at 01/09/20 0844  . FLUoxetine (PROZAC) capsule 60 mg  60 mg Oral Daily Eulas Post, MD   60 mg at 01/09/20 0843  . glipiZIDE (GLUCOTROL XL) 24 hr tablet 5 mg  5 mg Oral BID Eulas Post, MD   5 mg at 01/09/20 0843  . haloperidol (HALDOL) tablet 0.5 mg  0.5 mg Oral BID Eulas Post, MD   0.5 mg at 01/09/20 0844  . levothyroxine (SYNTHROID) tablet 88 mcg  88 mcg Oral QAC breakfast Eulas Post, MD   88 mcg at 01/09/20 [phone]  . linagliptin (TRADJENTA) tablet 5 mg  5 mg Oral Daily Eulas Post, MD   5 mg at 01/09/20 0843  . loratadine (CLARITIN) tablet 10 mg  10 mg Oral Daily Eulas Post, MD   10 mg at 01/09/20 0844  . magnesium hydroxide (MILK OF MAGNESIA) suspension 30 mL  30 mL Oral Daily PRN Eulas Post, MD   30 mL at 01/08/20 0820  . QUEtiapine (SEROQUEL) tablet 12.5 mg  12.5 mg Oral QHS Eulas Post, MD   12.5 mg at 01/08/20 2115   PTA Medications: Medications Prior to Admission  Medication Sig Dispense Refill Last Dose  . ACCU-CHEK AVIVA PLUS test strip      . Accu-Chek Softclix Lancets lancets      . clonazePAM (KLONOPIN) 1 MG tablet Take 1 tablet (1 mg total) by mouth at  bedtime. 30 tablet 0   . divalproex (DEPAKOTE ER) 500 MG 24 hr tablet Take 2 tablets (1,000 mg total) by mouth at bedtime. 60 tablet 1   . enalapril (VASOTEC) 5 MG tablet Take 1 tablet (5 mg total) by mouth 2 (two) times daily. 60 tablet 1   . FLUoxetine (PROZAC) 20 MG capsule Take 3 capsules (60 mg total) by mouth daily. 90 capsule 0   . glipiZIDE (GLUCOTROL XL) 5 MG 24 hr tablet Take 1 tablet (5 mg total) by mouth 2 (two) times daily. 60 tablet 1   . levothyroxine (SYNTHROID) 88 MCG tablet Take 1 tablet (88 mcg total) by mouth daily before breakfast. 30 tablet 1   . linagliptin (TRADJENTA) 5 MG TABS tablet Take 1 tablet (5 mg total) by mouth daily. 30 tablet 1   . loratadine (CLARITIN) 10 MG tablet Take 1 tablet (10 mg total) by mouth daily. 30 tablet 1   . lurasidone (LATUDA) 20 MG TABS tablet Take 1 tablet (20 mg total) by mouth daily with supper. 30 tablet 1   . metFORMIN (GLUCOPHAGE) 500 MG tablet Take 1 tablet (500 mg total) by mouth 2 (two) times daily with a meal. 60 tablet 1   . PAZEO 0.7 % SOLN Take  1 drop by mouth daily.      . rosuvastatin (CRESTOR) 40 MG tablet Take 1 tablet (40 mg total) by mouth daily. 30 tablet 1   . sulfamethoxazole-trimethoprim (BACTRIM DS) 800-160 MG tablet      . traZODone (DESYREL) 100 MG tablet Take 1 tablet (100 mg total) by mouth at bedtime. 30 tablet 1     Patient Stressors: Medication change or noncompliance Substance abuse Traumatic event  Patient Strengths: Ability for insight Motivation for treatment/growth  Treatment Modalities: Medication Management, Group therapy, Case management,  1 to 1 session with clinician, Psychoeducation, Recreational therapy.   Physician Treatment Plan for Primary Diagnosis: <principal problem not specified> Long Term Goal(s):     Short Term Goals:    Medication Management: Evaluate patient's response, side effects, and tolerance of medication regimen.  Therapeutic Interventions: 1 to 1 sessions, Unit Group  sessions and Medication administration.  Evaluation of Outcomes: Not Met  Physician Treatment Plan for Secondary Diagnosis: Active Problems:   Bipolar disorder current episode depressed ([place])  Long Term Goal(s):     Short Term Goals:       Medication Management: Evaluate patient's response, side effects, and tolerance of medication regimen.  Therapeutic Interventions: 1 to 1 sessions, Unit Group sessions and Medication administration.  Evaluation of Outcomes: Not Met   RN Treatment Plan for Primary Diagnosis: <principal problem not specified> Long Term Goal(s): Knowledge of disease and therapeutic regimen to maintain health will improve  Short Term Goals: Ability to participate in decision making will improve, Ability to verbalize feelings will improve and Ability to identify and develop effective coping behaviors will improve  Medication Management: RN will administer medications as ordered by provider, will assess and evaluate patient's response and provide education to patient for prescribed medication. RN will report any adverse and/or side effects to prescribing provider.  Therapeutic Interventions: 1 on 1 counseling sessions, Psychoeducation, Medication administration, Evaluate responses to treatment, Monitor vital signs and CBGs as ordered, Perform/monitor CIWA, COWS, AIMS and Fall Risk screenings as ordered, Perform wound care treatments as ordered.  Evaluation of Outcomes: Not Met   LCSW Treatment Plan for Primary Diagnosis: <principal problem not specified> Long Term Goal(s): Safe transition to appropriate next level of care at discharge, Engage patient in therapeutic group addressing interpersonal concerns.  Short Term Goals: Engage patient in aftercare planning with referrals and resources, Increase social support, Increase ability to appropriately verbalize feelings, Increase emotional regulation and Increase skills for wellness and recovery  Therapeutic Interventions:  Assess for all discharge needs, 1 to 1 time with Social worker, Explore available resources and support systems, Assess for adequacy in community support network, Educate family and significant other(s) on suicide prevention, Complete Psychosocial Assessment, Interpersonal group therapy.  Evaluation of Outcomes: Not Met   Progress in Treatment: Attending groups: No. Participating in groups: No. Taking medication as prescribed: Yes. Toleration medication: Yes. Family/Significant other contact made: No, will contact:  Patient refused SPE and stated she had no friends or family for CSW to contact  Patient understands diagnosis: Yes. Discussing patient identified problems/goals with staff: Yes. Medical problems stabilized or resolved: No. Denies suicidal/homicidal ideation: Yes. Issues/concerns per patient self-inventory: No. Other:   New problem(s) identified: No, Describe:  None  New Short Term/Long Term Goal(s): Elimination of symptoms of psychosis, medication management for mood stabilization; development of comprehensive mental wellness plan.   Patient Goals: Patient stated that she is afraid to be alone and wants to get to a place where she is not  afraid to be around people. Patient would like to increase her social support.  Discharge Plan or Barriers: Discharge plan is pending. Patient is interested in possible nursing home placement.   Reason for Continuation of Hospitalization: Depression Medication stabilization  Estimated Length of Stay: 5-7 days   Attendees: Patient: [name] 01/09/2020 12:09 PM  Physician: Dr. Hedwig Morton, MD 01/09/2020 12:09 PM  Nursing: Lyda Kalata, RN 01/09/2020 12:09 PM  RN Care Manager: 01/09/2020 12:09 PM  Social Worker: Raina Mina, [place] 01/09/2020 12:09 PM  Recreational Therapist:  01/09/2020 12:09 PM  Other:  01/09/2020 12:09 PM  Other:  01/09/2020 12:09 PM  Other: 01/09/2020 12:09 PM    Scribe for Treatment Team: Raina Mina,  [place] 01/09/2020 12:09 PM

## 2020-01-10 DIAGNOSIS — F3132 Bipolar disorder, current episode depressed, moderate: Secondary | ICD-10-CM

## 2020-01-10 LAB — LIPID PANEL
Cholesterol: 136 mg/dL (ref 0–200)
HDL: 38 mg/dL — ABNORMAL LOW (ref >40)
LDL Cholesterol: 54 mg/dL (ref 0–99)
Total CHOL/HDL Ratio: 3.6 RATIO
Triglycerides: 220 mg/dL — ABNORMAL HIGH (ref <150)
VLDL: 44 mg/dL — ABNORMAL HIGH (ref 0–40)

## 2020-01-10 LAB — GLUCOSE, CAPILLARY: Glucose-Capillary: 244 mg/dL — ABNORMAL HIGH (ref 70–99)

## 2020-01-10 MED ORDER — GLIPIZIDE ER 5 MG PO TB24
5.0000 mg | ORAL_TABLET | Freq: Every day | ORAL | Status: DC
  Administered 2020-01-10: 5 mg via ORAL
  Filled 2020-01-10: qty 1

## 2020-01-10 MED ORDER — LINAGLIPTIN 5 MG PO TABS
5.0000 mg | ORAL_TABLET | Freq: Two times a day (BID) | ORAL | Status: DC
  Administered 2020-01-10 – 2020-01-11 (×2): 5 mg via ORAL
  Filled 2020-01-10 (×2): qty 1

## 2020-01-10 MED ORDER — LURASIDONE HCL 40 MG PO TABS
20.0000 mg | ORAL_TABLET | Freq: Every day | ORAL | Status: DC
  Administered 2020-01-11: 20 mg via ORAL
  Filled 2020-01-10: qty 1

## 2020-01-10 MED ORDER — GLIPIZIDE ER 5 MG PO TB24
10.0000 mg | ORAL_TABLET | Freq: Every day | ORAL | Status: DC
  Administered 2020-01-11: 10 mg via ORAL
  Filled 2020-01-10: qty 2

## 2020-01-10 MED ORDER — GLIPIZIDE ER 5 MG PO TB24: 10.0000 mg | ORAL_TABLET | Freq: Two times a day (BID) | ORAL | Status: DC

## 2020-01-10 NOTE — Plan of Care (Signed)
Patient denies SI/HI/AVH and depression  Problem: Education: Goal: Emotional status will improve Outcome: Progressing Goal: Mental status will improve Outcome: Progressing

## 2020-01-10 NOTE — Progress Notes (Signed)
Patient spent most of shift resting in bed , she denies SI/HI/AVH . Patient endorsees anxiety and depression.  Patient remains isolative to her room this evening and she spent most of shift resting in bed. Patient compliant with medication administration. Patient being monitored Q 15 minutes for safety per unit protocol.Patient is currently in bed resting quietly at this time.

## 2020-01-10 NOTE — Progress Notes (Signed)
Recreation Therapy Notes  INPATIENT RECREATION THERAPY ASSESSMENT  Patient Details Name: [name] MRN: [id number] DOB: 10/13/1945 Today's Date: 01/10/2020       Information Obtained From: Patient  Able to Participate in Assessment/Interview: Yes  Patient Presentation: Responsive  Reason for Admission (Per Patient): Active Symptoms  Patient Stressors:    Coping Skills:   Read  Leisure Interests (2+):  Individual - Reading, Games - Word-search  Frequency of Recreation/Participation: Monthly  Awareness of Community Resources:     [employer]:     Current Use:    If no, Barriers?:    Expressed Interest in [employer] Information:    [employer] of Residence:  Insurance underwriter  Patient Main Form of Transportation:    Patient Strengths:  N/A  Patient Identified Areas of Improvement:  N/A  Patient Goal for Hospitalization:  N/A  Current SI (including self-harm):  No  Current HI:  No  Current AVH: No  Staff Intervention Plan: Group Attendance, Collaborate with Interdisciplinary Treatment Team  Consent to Intern Participation: N/A  Rox Mcgriff 01/10/2020, 4:22 PM

## 2020-01-10 NOTE — Plan of Care (Signed)
Pt alert and oriented x 4 denies SI, HI/A/V/H compliant with treatment plan. Stating her goal to be around other people and to hang out and try to feel better. Denies feeling depressed stating "I don't feel depressed but anxiety is what's my problem, I can't deal with it like I used to" Support and encouragement provided. Routine safety checks conducted every 15 minutes. Patient notified to inform staff with problems or concerns.No adverse drug reactions noted. Patient contracts for safety at this time. Will continue to monitor.

## 2020-01-10 NOTE — Progress Notes (Signed)
Recreation Therapy Notes  Date: 01/10/2020  Time: 9:30 am   Location: Craft room     Behavioral response: N/A   Intervention Topic: Leisure   Discussion/Intervention: Patient did not attend group.   Clinical Observations/Feedback:  Patient did not attend group.   Derrian Poli LRT/CTRS        Chanae Gemma 01/10/2020 1:00 PM

## 2020-01-10 NOTE — Progress Notes (Signed)
Patient calm and pleasant during assessment denying SI/HI/AVH and pain. Patient endorsees anxiety. Patient isolative to her room this evening, stating, "I feel like I just need sleep, but I am feeling a lot better." Patient given education, support, and encouragement to be active in her treatment plan. Patient compliant with medication administration per MD orders. Patient being monitored Q 15 minutes for safety per unit protocol. Pt remains safe on the unit.

## 2020-01-10 NOTE — Progress Notes (Signed)
[facility] MD Progress Note  01/10/2020 2:55 PM [name]  MRN:  [id number] Subjective: Follow-up for this patient well known to [place] from many encounters with bipolar disorder.  Patient presented anxious flustered and nervous dysphoric and a little hyperactive.  She tells me that she was feeling afraid at home.  She cannot really describe anything realistic she was afraid of but it sounds like she was getting jittery.  She was recently started on Wellbutrin by her outpatient provider.  I also noticed on her labs that she has a low TSH suggesting that she was clinically hyperthyroid.  Patient on interview today says she is feeling a little better but she is not a very clear historian about exactly which medicine she was taking at home. Principal Problem: Bipolar disorder current episode depressed ([place]) Diagnosis: Principal Problem:   Bipolar disorder current episode depressed ([place]) Active Problems:   Essential hypertension   Hypothyroidism   Dyslipidemia   Diabetes ([place])   PTSD (post-traumatic stress disorder)  Total Time spent with patient: 30 minutes  Past Psychiatric History: Long history of bipolar disorder multiple admissions and ER presentations.  Stays by herself and seems to be having more trouble if she gets older maintaining safety that way.  Patient has done pretty well especially on Depakote but she tends to resist it for reasons that change from time to time.  Past Medical History:  Past Medical History:  Diagnosis Date  . Arthritis   . Breast cancer ([place])   . Cancer [facility]) 2004   right breast ca  . Diabetes mellitus without complication ([place])   . Hypercholesterolemia   . Hypertension   . Hypothyroid   . Seasonal allergies     Past Surgical History:  Procedure Laterality Date  . ABDOMINAL HYSTERECTOMY    . BREAST BIOPSY Left    neg  . BREAST EXCISIONAL BIOPSY Right 2004   breast ca and lumpectomy  . BREAST LUMPECTOMY Right 2004   breast ca  . CHOLECYSTECTOMY     . COLONOSCOPY WITH PROPOFOL N/A 02/13/2017   Procedure: COLONOSCOPY WITH PROPOFOL;  Surgeon: Jonathon Bellows, MD;  Location: [facility] ENDOSCOPY;  Service: Gastroenterology;  Laterality: N/A;  . KIDNEY STONE SURGERY     Family History:  Family History  Problem Relation Age of Onset  . Bladder Cancer Neg Hx   . Kidney cancer Neg Hx    Family Psychiatric  History: See previous notes Social History:  Social History   Substance and Sexual Activity  Alcohol Use No     Social History   Substance and Sexual Activity  Drug Use No    Social History   Socioeconomic History  . Marital status: Divorced    Spouse name: Not on file  . Number of children: Not on file  . Years of education: Not on file  . Highest education level: Not on file  Occupational History  . Not on file  Tobacco Use  . Smoking status: Former Smoker    Types: Cigarettes  . Smokeless tobacco: Never Used  Vaping Use  . Vaping Use: Never used  Substance and Sexual Activity  . Alcohol use: No  . Drug use: No  . Sexual activity: Not Currently  Other Topics Concern  . Not on file  Social History Narrative  . Not on file   Social Determinants of Health   Financial Resource Strain:   . Difficulty of Paying Living Expenses: Not on file  Food Insecurity:   . Worried About  Running Out of Food in the Last Year: Not on file  . Ran Out of Food in the Last Year: Not on file  Transportation Needs:   . Lack of Transportation (Medical): Not on file  . Lack of Transportation (Non-Medical): Not on file  Physical Activity:   . Days of Exercise per Week: Not on file  . Minutes of Exercise per Session: Not on file  Stress:   . Feeling of Stress : Not on file  Social Connections:   . Frequency of Communication with Friends and Family: Not on file  . Frequency of Social Gatherings with Friends and Family: Not on file  . Attends Religious Services: Not on file  . Active Member of Clubs or Organizations: Not on file  . Attends  Archivist Meetings: Not on file  . Marital Status: Not on file   Additional Social History:                         Sleep: Fair  Appetite:  Fair  Current Medications: Current Facility-Administered Medications  Medication Dose Route Frequency Provider Last Rate Last Admin  . acetaminophen (TYLENOL) tablet 650 mg  650 mg Oral Q6H PRN Eulas Post, MD      . clonazePAM Bobbye [place]) tablet 1 mg  1 mg Oral BID Eulas Post, MD   1 mg at 01/10/20 0817  . divalproex (DEPAKOTE ER) 24 hr tablet 1,000 mg  1,000 mg Oral QHS Eulas Post, MD   1,000 mg at 01/09/20 2132  . enalapril (VASOTEC) tablet 5 mg  5 mg Oral BID Eulas Post, MD   5 mg at 01/10/20 0816  . FLUoxetine (PROZAC) capsule 60 mg  60 mg Oral Daily Eulas Post, MD   60 mg at 01/10/20 0816  . [START ON 01/11/2020] glipiZIDE (GLUCOTROL XL) 24 hr tablet 10 mg  10 mg Oral Q breakfast Rikita Grabert T, MD      . glipiZIDE (GLUCOTROL XL) 24 hr tablet 5 mg  5 mg Oral QHS Shardae Kleinman T, MD      . linagliptin (TRADJENTA) tablet 5 mg  5 mg Oral BID Mable Dara T, MD      . loratadine (CLARITIN) tablet 10 mg  10 mg Oral Daily Eulas Post, MD   10 mg at 01/10/20 0816  . [START ON 01/11/2020] lurasidone (LATUDA) tablet 20 mg  20 mg Oral Q breakfast Milcah Dulany T, MD      . magnesium hydroxide (MILK OF MAGNESIA) suspension 30 mL  30 mL Oral Daily PRN Eulas Post, MD   30 mL at 01/08/20 0820    Lab Results:  Results for orders placed or performed during the hospital encounter of 01/07/20 (from the past 48 hour(s))  Glucose, capillary     Status: Abnormal   Collection Time: 01/09/20  6:56 AM  Result Value Ref Range   Glucose-Capillary 225 (H) 70 - 99 mg/dL    Comment: Glucose reference range applies only to samples taken after fasting for at least 8 hours.  Glucose, capillary     Status: Abnormal   Collection Time: 01/10/20  6:56 AM  Result Value Ref Range   Glucose-Capillary 244 (H) 70 - 99  mg/dL    Comment: Glucose reference range applies only to samples taken after fasting for at least 8 hours.   Comment 1 Notify RN     Blood Alcohol level:  Lab Results  Component Value Date   ETH <10  01/07/2020   ETH <10 18/29/9371    Metabolic Disorder Labs: Lab Results  Component Value Date   HGBA1C 9.6 (H) 07/21/2018   MPG 228.82 07/21/2018   MPG 240 07/07/2018   No results found for: PROLACTIN Lab Results  Component Value Date   CHOL 142 08/11/2019   TRIG 236 (H) 08/11/2019   HDL 43 08/11/2019   CHOLHDL 3.3 08/11/2019   VLDL 47 (H) 08/11/2019   LDLCALC 52 08/11/2019   LDLCALC 75 07/21/2018    Physical Findings: AIMS: Facial and Oral Movements Muscles of Facial Expression: None, normal Lips and Perioral Area: None, normal Jaw: None, normal Tongue: None, normal,Extremity Movements Upper (arms, wrists, hands, fingers): None, normal Lower (legs, knees, ankles, toes): None, normal, Trunk Movements Neck, shoulders, hips: None, normal, Overall Severity Severity of abnormal movements (highest score from questions above): None, normal Incapacitation due to abnormal movements: None, normal Patient's awareness of abnormal movements (rate only patient's report): No Awareness, Dental Status Current problems with teeth and/or dentures?: No Does patient usually wear dentures?: No  CIWA:    COWS:     Musculoskeletal: Strength & Muscle Tone: within normal limits Gait & Station: normal Patient leans: N/A  Psychiatric Specialty Exam: Physical Exam Vitals and nursing note reviewed.  Constitutional:      Appearance: She is well-developed.  HENT:     Head: Normocephalic and atraumatic.  Eyes:     Conjunctiva/sclera: Conjunctivae normal.     Pupils: Pupils are equal, round, and reactive to light.  Cardiovascular:     Heart sounds: Normal heart sounds.  Pulmonary:     Effort: Pulmonary effort is normal.  Abdominal:     Palpations: Abdomen is soft.  Musculoskeletal:         General: Normal range of motion.     Cervical back: Normal range of motion.  Skin:    General: Skin is warm and dry.  Neurological:     Mental Status: She is alert.  Psychiatric:        Attention and Perception: She is inattentive.        Mood and Affect: Mood is anxious.        Speech: Speech normal.        Behavior: Behavior is agitated. Behavior is not aggressive.        Thought Content: Thought content does not include suicidal ideation.        Cognition and Memory: Memory is impaired.        Judgment: Judgment is impulsive.     Review of Systems  Constitutional: Negative.   HENT: Negative.   Eyes: Negative.   Respiratory: Negative.   Cardiovascular: Negative.   Gastrointestinal: Negative.   Musculoskeletal: Negative.   Skin: Negative.   Neurological: Negative.   Psychiatric/Behavioral: Positive for confusion. The patient is nervous/anxious.     Blood pressure 133/77, pulse 79, temperature 97.7 F (36.5 C), temperature source Oral, resp. rate 18, height 5\' 1"  (1.549 m), weight 74.8 kg, SpO2 94 %.Body mass index is 31.16 kg/m.  General Appearance: Casual  Eye Contact:  Fair  Speech:  Normal Rate  Volume:  Normal  Mood:  Anxious and Dysphoric  Affect:  Congruent  Thought Process:  Coherent  Orientation:  Full (Time, Place, and Person)  Thought Content:  Tangential  Suicidal Thoughts:  No  Homicidal Thoughts:  No  Memory:  Immediate;   Fair Recent;   Fair Remote;   Fair  Judgement:  Fair  Insight:  Fair  Psychomotor  Activity:  Normal  Concentration:  Concentration: Fair  Recall:  [employer] of Knowledge:  Fair  Language:  Fair  Akathisia:  No  Handed:  Right  AIMS (if indicated):     Assets:  Desire for Improvement Housing Resilience  ADL's:  Intact  Cognition:  WNL  Sleep:  Number of Hours: 8.75     Treatment Plan Summary: Daily contact with patient to assess and evaluate symptoms and progress in treatment, Medication management and Plan She is  still a little bit nervous but she is pretty lucid not obviously delusional and not suicidal.  She usually pulls herself together and just a couple days.  I am concerned that her blood sugars have been running high.  She tells me that she can no longer take metformin because it makes her sick to her stomach.  I am a little worried about whether she is going to be able to keep her sugars under control with the Metformin not as part of her medicine.  We have increased her glipizide and Tradjenta.  I will check an A1c.  She is tolerating her other medicines okay right now.  Simplified them down to pretty much what she had been taking last time she was here.  We will reassess tomorrow and may consider discharge at that time.  She is supposed to be following up with [facility]  Alethia Berthold, MD 01/10/2020, 2:55 PM

## 2020-01-11 LAB — HEMOGLOBIN A1C
Hgb A1c MFr Bld: 9.2 % — ABNORMAL HIGH (ref 4.8–5.6)
Mean Plasma Glucose: 217 mg/dL

## 2020-01-11 MED ORDER — CLONAZEPAM 1 MG PO TABS: 1.0000 mg | ORAL_TABLET | Freq: Two times a day (BID) | ORAL | 1 refills | Status: DC

## 2020-01-11 MED ORDER — LURASIDONE HCL 20 MG PO TABS: 20.0000 mg | ORAL_TABLET | Freq: Every day | ORAL | 1 refills | Status: DC

## 2020-01-11 MED ORDER — LORATADINE 10 MG PO TABS: 10.0000 mg | ORAL_TABLET | Freq: Every day | ORAL | 1 refills | Status: DC

## 2020-01-11 MED ORDER — ENALAPRIL MALEATE 5 MG PO TABS: 5.0000 mg | ORAL_TABLET | Freq: Two times a day (BID) | ORAL | 1 refills | Status: DC

## 2020-01-11 MED ORDER — FLUOXETINE HCL 20 MG PO CAPS: 60.0000 mg | ORAL_CAPSULE | Freq: Every day | ORAL | 1 refills | Status: DC

## 2020-01-11 MED ORDER — GLIPIZIDE ER 10 MG PO TB24
10.0000 mg | ORAL_TABLET | Freq: Every day | ORAL | 1 refills | Status: DC
Start: 2020-01-12 — End: 2020-08-22

## 2020-01-11 MED ORDER — ROSUVASTATIN CALCIUM 40 MG PO TABS
40.0000 mg | ORAL_TABLET | Freq: Every day | ORAL | 1 refills | Status: DC
Start: 2020-01-11 — End: 2020-11-23

## 2020-01-11 MED ORDER — LINAGLIPTIN 5 MG PO TABS
5.0000 mg | ORAL_TABLET | Freq: Two times a day (BID) | ORAL | 1 refills | Status: DC
Start: 2020-01-11 — End: 2020-04-11

## 2020-01-11 MED ORDER — DIVALPROEX SODIUM ER 500 MG PO TB24
1000.0000 mg | ORAL_TABLET | Freq: Every day | ORAL | 1 refills | Status: DC
Start: 2020-01-11 — End: 2020-11-23

## 2020-01-11 MED ORDER — GLIPIZIDE ER 5 MG PO TB24: 5.0000 mg | ORAL_TABLET | Freq: Every day | ORAL | 1 refills | Status: DC

## 2020-01-11 NOTE — Progress Notes (Signed)
Pt denies SI, HI and AVH. Pt was educated on d/c plan and verbalizes understanding. Pt received belongings, prescriptions and d/c packet. Grier Czerwinski RN 

## 2020-01-11 NOTE — Plan of Care (Signed)
Pt rates depression 3/10, Pr denies anxiety, SI, HI and AVH. Pt anticipates DC. Collier Bullock RN Problem: Education: Goal: Knowledge of  General Education information/materials will improve Outcome: Adequate for Discharge Goal: Emotional status will improve Outcome: Adequate for Discharge Goal: Mental status will improve Outcome: Adequate for Discharge Goal: Verbalization of understanding the information provided will improve Outcome: Adequate for Discharge   Problem: Safety: Goal: Periods of time without injury will increase Outcome: Adequate for Discharge   Problem: Education: Goal: Knowledge of the prescribed therapeutic regimen will improve Outcome: Adequate for Discharge   Problem: Safety: Goal: Ability to disclose and discuss suicidal ideas will improve Outcome: Adequate for Discharge Goal: Ability to identify and utilize support systems that promote safety will improve Outcome: Adequate for Discharge

## 2020-01-11 NOTE — Progress Notes (Signed)
Recreation Therapy Notes  Date: 08/24//2021  Time: 9:30 am  Location: Craft room   Behavioral response: Appropriate  Intervention Topic: Relaxation   Discussion/Intervention:  Group content today was focused on relaxation. The group defined relaxation and identified healthy ways to relax. Individuals expressed how much time they spend relaxing. Patients expressed how much their life would be if they did not make time for themselves to relax. The group stated ways they could improve their relaxation techniques in the future.  Individuals participated in the intervention "Time to Relax" where they had a chance to experience different relaxation techniques.  Clinical Observations/Feedback:  Patient came to group late and was focused on what peers and staff had to say about relaxation. Individual was social with peers and staff while participating in the intervention. Yazir Koerber LRT/CTRS         Tuwana Kapaun 01/11/2020 11:14 AM

## 2020-01-11 NOTE — Discharge Summary (Signed)
Physician Discharge Summary Note  Patient:  [name] is an 74 y.o., female MRN:  [id number] DOB:  [birth year] Patient phone:  [phone] (home)  Patient address:   [address] [place] [address],  Total Time spent with patient: 30 minutes  Date of Admission:  01/07/2020 Date of Discharge: 01/11/2019  Reason for Admission: Patient was admitted because of presentation with symptoms of depression agitation anxiety feelings of hopelessness with some passive suicidal thoughts and confusion  Principal Problem: Bipolar disorder current episode depressed [facility]) Discharge Diagnoses: Principal Problem:   Bipolar disorder current episode depressed ([place]) Active Problems:   Essential hypertension   Hypothyroidism   Dyslipidemia   Diabetes ([place])   PTSD (post-traumatic stress disorder)   Past Psychiatric History: Multiple hospitalizations with a longstanding diagnosis of bipolar disorder.  Frequent admissions for almost identical symptoms in situations as this time.  Followed by [facility].  Past Medical History:  Past Medical History:  Diagnosis Date  . Arthritis   . Breast cancer ([place])   . Cancer [facility]) 2004   right breast ca  . Diabetes mellitus without complication ([place])   . Hypercholesterolemia   . Hypertension   . Hypothyroid   . Seasonal allergies     Past Surgical History:  Procedure Laterality Date  . ABDOMINAL HYSTERECTOMY    . BREAST BIOPSY Left    neg  . BREAST EXCISIONAL BIOPSY Right 2004   breast ca and lumpectomy  . BREAST LUMPECTOMY Right 2004   breast ca  . CHOLECYSTECTOMY    . COLONOSCOPY WITH PROPOFOL N/A 02/13/2017   Procedure: COLONOSCOPY WITH PROPOFOL;  Surgeon: Jonathon Bellows, MD;  Location: [facility] ENDOSCOPY;  Service: Gastroenterology;  Laterality: N/A;  . KIDNEY STONE SURGERY     Family History:  Family History  Problem Relation Age of Onset  . Bladder Cancer Neg Hx   . Kidney cancer Neg Hx    Family Psychiatric  History: See  previous.  Nothing contributory Social History:  Social History   Substance and Sexual Activity  Alcohol Use No     Social History   Substance and Sexual Activity  Drug Use No    Social History   Socioeconomic History  . Marital status: Divorced    Spouse name: Not on file  . Number of children: Not on file  . Years of education: Not on file  . Highest education level: Not on file  Occupational History  . Not on file  Tobacco Use  . Smoking status: Former Smoker    Types: Cigarettes  . Smokeless tobacco: Never Used  Vaping Use  . Vaping Use: Never used  Substance and Sexual Activity  . Alcohol use: No  . Drug use: No  . Sexual activity: Not Currently  Other Topics Concern  . Not on file  Social History Narrative  . Not on file   Social Determinants of Health   Financial Resource Strain:   . Difficulty of Paying Living Expenses: Not on file  Food Insecurity:   . Worried About Charity fundraiser in the Last Year: Not on file  . Ran Out of Food in the Last Year: Not on file  Transportation Needs:   . Lack of Transportation (Medical): Not on file  . Lack of Transportation (Non-Medical): Not on file  Physical Activity:   . Days of Exercise per Week: Not on file  . Minutes of Exercise per Session: Not on file  Stress:   . Feeling of Stress :  Not on file  Social Connections:   . Frequency of Communication with Friends and Family: Not on file  . Frequency of Social Gatherings with Friends and Family: Not on file  . Attends Religious Services: Not on file  . Active Member of Clubs or Organizations: Not on file  . Attends Archivist Meetings: Not on file  . Marital Status: Not on file    Hospital Course: In the hospital she was kept on 15-minute checks.  She did not display any dangerous or aggressive behavior.  She initially had some passive suicidal thoughts but no intention of acting on them.  She was restarted on a combination of medicines consistent  with what had been helpful for her in the past.  Patient was compliant with medicine and attended groups and interacted appropriately.  By the time of discharge she was denying any suicidal thoughts and stated that her mood felt much better.  This is very typical for this patient who usually improves within a couple of days once back on her medicine.  1 medical issue is that she has stopped taking metformin.  She insists that metformin makes her sick to her stomach and she will not take it anymore.  As a result both her Januvia and glipizide have been increased although whether this will be adequate remains to be seen and she needs to follow-up with her primary care doctor.  Another issue is that her TSH came back low suggesting that she is clinically hyperthyroid.  As result I discontinued her Synthroid and have informed her of this and that she also needs to follow-up with her primary care doctor about that.  She met with the representative of [facility] and is agreeable to continued follow-up with them.  Physical Findings: AIMS: Facial and Oral Movements Muscles of Facial Expression: None, normal Lips and Perioral Area: None, normal Jaw: None, normal Tongue: None, normal,Extremity Movements Upper (arms, wrists, hands, fingers): None, normal Lower (legs, knees, ankles, toes): None, normal, Trunk Movements Neck, shoulders, hips: None, normal, Overall Severity Severity of abnormal movements (highest score from questions above): None, normal Incapacitation due to abnormal movements: None, normal Patient's awareness of abnormal movements (rate only patient's report): No Awareness, Dental Status Current problems with teeth and/or dentures?: No Does patient usually wear dentures?: No  CIWA:    COWS:     Musculoskeletal: Strength & Muscle Tone: within normal limits Gait & Station: normal Patient leans: N/A  Psychiatric Specialty Exam: Physical Exam Vitals and nursing note reviewed.  Constitutional:       Appearance: She is well-developed.  HENT:     Head: Normocephalic and atraumatic.  Eyes:     Conjunctiva/sclera: Conjunctivae normal.     Pupils: Pupils are equal, round, and reactive to light.  Cardiovascular:     Heart sounds: Normal heart sounds.  Pulmonary:     Effort: Pulmonary effort is normal.  Abdominal:     Palpations: Abdomen is soft.  Musculoskeletal:        General: Normal range of motion.     Cervical back: Normal range of motion.  Skin:    General: Skin is warm and dry.  Neurological:     General: No focal deficit present.     Mental Status: She is alert.  Psychiatric:        Mood and Affect: Mood normal.     Review of Systems  Constitutional: Negative.   HENT: Negative.   Eyes: Negative.   Respiratory: Negative.  Cardiovascular: Negative.   Gastrointestinal: Negative.   Musculoskeletal: Negative.   Skin: Negative.   Neurological: Negative.   Psychiatric/Behavioral: Negative.     Blood pressure (!) 140/94, pulse 93, temperature 98 F (36.7 C), temperature source Oral, resp. rate 18, height _0  (1.549 m), weight 74.8 kg, SpO2 92 %.Body mass index is 31.16 kg/m.  General Appearance: Casual  Eye Contact:  Good  Speech:  Clear and Coherent  Volume:  Normal  Mood:  Euthymic  Affect:  Congruent  Thought Process:  Goal Directed  Orientation:  Full (Time, Place, and Person)  Thought Content:  Logical  Suicidal Thoughts:  No  Homicidal Thoughts:  No  Memory:  Immediate;   Fair Recent;   Fair Remote;   Fair  Judgement:  Fair  Insight:  Fair  Psychomotor Activity:  Normal  Concentration:  Concentration: Fair  Recall:  [place] of Knowledge:  Fair  Language:  Fair  Akathisia:  No  Handed:  Right  AIMS (if indicated):     Assets:  Communication Skills Desire for Improvement Housing Resilience Social Support  ADL's:  Intact  Cognition:  WNL  Sleep:  Number of Hours: 6.5     Have you used any form of tobacco in the last 30 days?  (Cigarettes, Smokeless Tobacco, Cigars, and/or Pipes): No  Has this patient used any form of tobacco in the last 30 days? (Cigarettes, Smokeless Tobacco, Cigars, and/or Pipes) Yes, No  Blood Alcohol level:  Lab Results  Component Value Date   ETH <10 01/07/2020   ETH <10 25/85/2778    Metabolic Disorder Labs:  Lab Results  Component Value Date   HGBA1C 9.2 (H) 01/10/2020   MPG 217 01/10/2020   MPG 228.82 07/21/2018   No results found for: PROLACTIN Lab Results  Component Value Date   CHOL 136 01/10/2020   TRIG 220 (H) 01/10/2020   HDL 38 (L) 01/10/2020   CHOLHDL 3.6 01/10/2020   VLDL 44 (H) 01/10/2020   LDLCALC 54 01/10/2020   LDLCALC 52 08/11/2019    See Psychiatric Specialty Exam and Suicide Risk Assessment completed by Attending Physician prior to discharge.  Discharge destination:  Home  Is patient on multiple antipsychotic therapies at discharge:  No   Has Patient had three or more failed trials of antipsychotic monotherapy by history:  No  Recommended Plan for Multiple Antipsychotic Therapies: NA  Discharge Instructions    Diet - low sodium heart healthy   Complete by: As directed    Increase activity slowly   Complete by: As directed      Allergies as of 01/11/2020      Reactions   Navane [thiothixene] Other (See Comments)   Reaction:  Unknown   Penicillins Rash   Has patient had a PCN reaction causing immediate rash, facial/tongue/throat swelling, SOB or lightheadedness with hypotension: No Has patient had a PCN reaction causing severe rash involving mucus membranes or skin necrosis: No Has patient had a PCN reaction that required hospitalization: No Has patient had a PCN reaction occurring within the last 10 years: No If all of the above answers are "NO", then may proceed with Cephalosporin use.      Medication List    STOP taking these medications   levothyroxine 88 MCG tablet Commonly known as: SYNTHROID   metFORMIN 500 MG tablet Commonly  known as: GLUCOPHAGE   Pazeo 0.7 % Soln Generic drug: Olopatadine HCl   sulfamethoxazole-trimethoprim 800-160 MG tablet Commonly known as: BACTRIM DS  traZODone 100 MG tablet Commonly known as: DESYREL     TAKE these medications     Indication  Accu-Chek Aviva Plus test strip Generic drug: glucose blood  Indication: Diabetes   Accu-Chek Softclix Lancets lancets  Indication: Diabetes   clonazePAM 1 MG tablet Commonly known as: KLONOPIN Take 1 tablet (1 mg total) by mouth 2 (two) times daily. What changed: when to take this  Indication: Manic-Depression   divalproex 500 MG 24 hr tablet Commonly known as: DEPAKOTE ER Take 2 tablets (1,000 mg total) by mouth at bedtime.  Indication: MIXED BIPOLAR AFFECTIVE DISORDER   enalapril 5 MG tablet Commonly known as: VASOTEC Take 1 tablet (5 mg total) by mouth 2 (two) times daily.  Indication: High Blood Pressure Disorder   FLUoxetine 20 MG capsule Commonly known as: PROZAC Take 3 capsules (60 mg total) by mouth daily.  Indication: Depressive Phase of Manic-Depression   glipiZIDE 5 MG 24 hr tablet Commonly known as: GLUCOTROL XL Take 1 tablet (5 mg total) by mouth at bedtime. What changed: You were already taking a medication with the same name, and this prescription was added. Make sure you understand how and when to take each.  Indication: Type 2 Diabetes   glipiZIDE 10 MG 24 hr tablet Commonly known as: GLUCOTROL XL Take 1 tablet (10 mg total) by mouth daily with breakfast. Start taking on: January 12, 2020 What changed:   medication strength  how much to take  when to take this  Indication: Type 2 Diabetes   linagliptin 5 MG Tabs tablet Commonly known as: TRADJENTA Take 1 tablet (5 mg total) by mouth 2 (two) times daily. What changed: when to take this  Indication: Type 2 Diabetes   loratadine 10 MG tablet Commonly known as: CLARITIN Take 1 tablet (10 mg total) by mouth daily.  Indication: Perennial  Allergic Rhinitis   lurasidone 20 MG Tabs tablet Commonly known as: LATUDA Take 1 tablet (20 mg total) by mouth daily with breakfast. Start taking on: January 12, 2020 What changed: when to take this  Indication: Depressive Phase of Manic-Depression   rosuvastatin 40 MG tablet Commonly known as: CRESTOR Take 1 tablet (40 mg total) by mouth daily.  Indication: Disease involving Lipid Deposits in the Arteries        Follow-up recommendations:  Activity:  Activity as tolerated Diet:  Low carbohydrate diabetic diet Other:  Follow-up with [facility].  Remember to follow-up with primary care doctor especially about diabetes and thyroid treatment  Comments: Prescriptions provided at discharge  Signed: Alethia Berthold, MD 01/11/2020, 9:35 AM

## 2020-01-11 NOTE — [facility] Suicide Risk Assessment (Signed)
[facility] Discharge Suicide Risk Assessment   Principal Problem: Bipolar disorder current episode depressed [facility]) Discharge Diagnoses: Principal Problem:   Bipolar disorder current episode depressed ([place]) Active Problems:   Essential hypertension   Hypothyroidism   Dyslipidemia   Diabetes ([place])   PTSD (post-traumatic stress disorder)   Total Time spent with patient: 30 minutes  Musculoskeletal: Strength & Muscle Tone: within normal limits Gait & Station: normal Patient leans: N/A  Psychiatric Specialty Exam: Review of Systems  Constitutional: Negative.   HENT: Negative.   Eyes: Negative.   Respiratory: Negative.   Cardiovascular: Negative.   Gastrointestinal: Negative.   Musculoskeletal: Negative.   Skin: Negative.   Neurological: Negative.   Psychiatric/Behavioral: Negative.     Blood pressure (!) 140/94, pulse 93, temperature 98 F (36.7 C), temperature source Oral, resp. rate 18, height 5\' 1"  (1.549 m), weight 74.8 kg, SpO2 92 %.Body mass index is 31.16 kg/m.  General Appearance: Casual  Eye Contact::  Good  Speech:  Clear and Coherent409  Volume:  Normal  Mood:  Euthymic  Affect:  Congruent  Thought Process:  Coherent  Orientation:  Full (Time, Place, and Person)  Thought Content:  Logical  Suicidal Thoughts:  No  Homicidal Thoughts:  No  Memory:  Immediate;   Fair Recent;   Fair Remote;   Fair  Judgement:  Fair  Insight:  Fair  Psychomotor Activity:  Normal  Concentration:  Fair  Recall:  [employer] [facility] [place]  Language: Fair  Akathisia:  No  Handed:  Right  AIMS (if indicated):     Assets:  Communication Skills Desire for Improvement Housing Resilience Social Support  Sleep:  Number of Hours: 6.5  Cognition: WNL  ADL's:  Intact   Mental Status Per Nursing Assessment::   On Admission:  Suicidal ideation indicated by patient  Demographic Factors:  Age 74 or older and Living alone  Loss Factors: NA  Historical  Factors: Impulsivity  Risk Reduction Factors:   Positive social support, Positive therapeutic relationship and Positive coping skills or problem solving skills  Continued Clinical Symptoms:  Bipolar Disorder:   Mixed State  Cognitive Features That Contribute To Risk:  None    Suicide Risk:  Minimal: No identifiable suicidal ideation.  Patients presenting with no risk factors but with morbid ruminations; may be classified as minimal risk based on the severity of the depressive symptoms    Plan Of Care/Follow-up recommendations:  Activity:  Activity as tolerated Diet:  Regular diet Other:  Remind patient strongly to follow-up with [facility] and outpatient treatment and medication management  Alethia Berthold, MD 01/11/2020, 9:28 AM

## 2020-01-12 NOTE — Progress Notes (Signed)
CSW notes that note should have been submitted 01/11/2020.    [facility] Adult Case Management Discharge Plan :  Will you be returning to the same living situation after discharge:  Yes,  pt reports that she is returning home. At discharge, do you have transportation home?: Yes,  CSW will assist with transportation. Do you have the ability to pay for your medications: Yes,  [employer] Medicare  Release of information consent forms completed and in the chart;  Patient's signature needed at discharge.  Patient to Follow up at:  Follow-up Information    [facility] Follow up.   Why: Your CST team will follow up with you at your home tomorrow, 01/12/2020, at [place].  Thanks! Contact information: [place] [address] [phone]               Next level of care provider has access to [place] and Suicide Prevention discussed: No.  Have you used any form of tobacco in the last 30 days? (Cigarettes, Smokeless Tobacco, Cigars, and/or Pipes): No  Has patient been referred to the Quitline?: Patient refused referral  Patient has been referred for addiction treatment: Pt. refused referral  Rozann Lesches, LCSW 01/12/2020, 11:52 AM

## 2020-01-17 ENCOUNTER — Ambulatory Visit: Payer: Medicare HMO | Admitting: Podiatry

## 2020-02-07 ENCOUNTER — Encounter: Payer: Self-pay | Admitting: Podiatry

## 2020-02-07 ENCOUNTER — Other Ambulatory Visit: Payer: Self-pay

## 2020-02-07 ENCOUNTER — Ambulatory Visit (INDEPENDENT_AMBULATORY_CARE_PROVIDER_SITE_OTHER): Payer: Medicare HMO | Admitting: Podiatry

## 2020-02-07 DIAGNOSIS — M79675 Pain in left toe(s): Secondary | ICD-10-CM | POA: Diagnosis not present

## 2020-02-07 DIAGNOSIS — B351 Tinea unguium: Secondary | ICD-10-CM | POA: Diagnosis not present

## 2020-02-07 DIAGNOSIS — M79674 Pain in right toe(s): Secondary | ICD-10-CM | POA: Diagnosis not present

## 2020-02-07 DIAGNOSIS — E119 Type 2 diabetes mellitus without complications: Secondary | ICD-10-CM

## 2020-02-07 NOTE — Progress Notes (Signed)

## 2020-03-01 ENCOUNTER — Emergency Department: Payer: Medicare HMO

## 2020-03-01 ENCOUNTER — Emergency Department
Admission: EM | Admit: 2020-03-01 | Discharge: 2020-03-01 | Disposition: A | Discharge status: Home / Self Care | Payer: Medicare HMO | Attending: Emergency Medicine | Admitting: Emergency Medicine

## 2020-03-01 ENCOUNTER — Other Ambulatory Visit: Payer: Self-pay

## 2020-03-01 DIAGNOSIS — E039 Hypothyroidism, unspecified: Secondary | ICD-10-CM | POA: Insufficient documentation

## 2020-03-01 DIAGNOSIS — I10 Essential (primary) hypertension: Secondary | ICD-10-CM | POA: Diagnosis not present

## 2020-03-01 DIAGNOSIS — Z7984 Long term (current) use of oral hypoglycemic drugs: Secondary | ICD-10-CM | POA: Diagnosis not present

## 2020-03-01 DIAGNOSIS — Z87891 Personal history of nicotine dependence: Secondary | ICD-10-CM | POA: Insufficient documentation

## 2020-03-01 DIAGNOSIS — Z79899 Other long term (current) drug therapy: Secondary | ICD-10-CM | POA: Insufficient documentation

## 2020-03-01 DIAGNOSIS — Z20822 Contact with and (suspected) exposure to covid-19: Secondary | ICD-10-CM

## 2020-03-01 DIAGNOSIS — R0602 Shortness of breath: Secondary | ICD-10-CM | POA: Insufficient documentation

## 2020-03-01 DIAGNOSIS — Z853 Personal history of malignant neoplasm of breast: Secondary | ICD-10-CM | POA: Diagnosis not present

## 2020-03-01 DIAGNOSIS — E1165 Type 2 diabetes mellitus with hyperglycemia: Secondary | ICD-10-CM | POA: Insufficient documentation

## 2020-03-01 DIAGNOSIS — Z760 Encounter for issue of repeat prescription: Secondary | ICD-10-CM

## 2020-03-01 DIAGNOSIS — E860 Dehydration: Secondary | ICD-10-CM | POA: Diagnosis not present

## 2020-03-01 DIAGNOSIS — N179 Acute kidney failure, unspecified: Secondary | ICD-10-CM

## 2020-03-01 DIAGNOSIS — R739 Hyperglycemia, unspecified: Secondary | ICD-10-CM

## 2020-03-01 DIAGNOSIS — R7989 Other specified abnormal findings of blood chemistry: Secondary | ICD-10-CM

## 2020-03-01 LAB — URINE DRUG SCREEN, QUALITATIVE (ARMC ONLY)
Amphetamines, Ur Screen: NOT DETECTED
Barbiturates, Ur Screen: NOT DETECTED
Benzodiazepine, Ur Scrn: NOT DETECTED
Cannabinoid 50 Ng, Ur ~~LOC~~: NOT DETECTED
Cocaine Metabolite,Ur ~~LOC~~: NOT DETECTED
MDMA (Ecstasy)Ur Screen: NOT DETECTED
Methadone Scn, Ur: NOT DETECTED
Opiate, Ur Screen: NOT DETECTED
Phencyclidine (PCP) Ur S: NOT DETECTED
Tricyclic, Ur Screen: NOT DETECTED

## 2020-03-01 LAB — CBC
HCT: 48.7 % — ABNORMAL HIGH (ref 36.0–46.0)
Hemoglobin: 16.2 g/dL — ABNORMAL HIGH (ref 12.0–15.0)
MCH: 29.2 pg (ref 26.0–34.0)
MCHC: 33.3 g/dL (ref 30.0–36.0)
MCV: 87.9 fL (ref 80.0–100.0)
Platelets: 182 10*3/uL (ref 150–400)
RBC: 5.54 MIL/uL — ABNORMAL HIGH (ref 3.87–5.11)
RDW: 13 % (ref 11.5–15.5)
WBC: 9.5 10*3/uL (ref 4.0–10.5)
nRBC: 0 % (ref 0.0–0.2)

## 2020-03-01 LAB — RESPIRATORY PANEL BY RT PCR (FLU A&B, COVID)
Influenza A by PCR: NEGATIVE
Influenza B by PCR: NEGATIVE
SARS Coronavirus 2 by RT PCR: NEGATIVE

## 2020-03-01 LAB — COMPREHENSIVE METABOLIC PANEL
ALT: 14 U/L (ref 0–44)
AST: 15 U/L (ref 15–41)
Albumin: 4.3 g/dL (ref 3.5–5.0)
Alkaline Phosphatase: 58 U/L (ref 38–126)
Anion gap: 13 (ref 5–15)
BUN: 33 mg/dL — ABNORMAL HIGH (ref 8–23)
CO2: 24 mmol/L (ref 22–32)
Calcium: 9.8 mg/dL (ref 8.9–10.3)
Chloride: 97 mmol/L — ABNORMAL LOW (ref 98–111)
Creatinine, Ser: 1.52 mg/dL — ABNORMAL HIGH (ref 0.44–1.00)
GFR, Estimated: 33 mL/min — ABNORMAL LOW (ref >60)
Glucose, Bld: 335 mg/dL — ABNORMAL HIGH (ref 70–99)
Potassium: 4.4 mmol/L (ref 3.5–5.1)
Sodium: 134 mmol/L — ABNORMAL LOW (ref 135–145)
Total Bilirubin: 0.7 mg/dL (ref 0.3–1.2)
Total Protein: 7.8 g/dL (ref 6.5–8.1)

## 2020-03-01 LAB — ETHANOL: Alcohol, Ethyl (B): <10 mg/dL (ref <10)

## 2020-03-01 LAB — VALPROIC ACID LEVEL: Valproic Acid Lvl: 111 ug/mL — ABNORMAL HIGH (ref 50.0–100.0)

## 2020-03-01 LAB — TROPONIN I (HIGH SENSITIVITY): Troponin I (High Sensitivity): 6 ng/L (ref <18)

## 2020-03-01 LAB — SALICYLATE LEVEL: Salicylate Lvl: <7 mg/dL — ABNORMAL LOW (ref 7.0–30.0)

## 2020-03-01 LAB — BRAIN NATRIURETIC PEPTIDE: B Natriuretic Peptide: 82.3 pg/mL (ref 0.0–100.0)

## 2020-03-01 LAB — ACETAMINOPHEN LEVEL: Acetaminophen (Tylenol), Serum: <10 ug/mL — ABNORMAL LOW (ref 10–30)

## 2020-03-01 LAB — MAGNESIUM: Magnesium: 2 mg/dL (ref 1.7–2.4)

## 2020-03-01 LAB — FIBRIN DERIVATIVES D-DIMER (ARMC ONLY): Fibrin derivatives D-dimer (ARMC): 256.91 ng/mL (FEU) (ref 0.00–499.00)

## 2020-03-01 LAB — GLUCOSE, CAPILLARY
Glucose-Capillary: 321 mg/dL — ABNORMAL HIGH (ref 70–99)
Glucose-Capillary: 318 mg/dL — ABNORMAL HIGH (ref 70–99)
Glucose-Capillary: 302 mg/dL — ABNORMAL HIGH (ref 70–99)

## 2020-03-01 LAB — TSH: TSH: 22.532 u[IU]/mL — ABNORMAL HIGH (ref 0.350–4.500)

## 2020-03-01 LAB — T4, FREE: Free T4: 0.67 ng/dL (ref 0.61–1.12)

## 2020-03-01 IMAGING — CR DG CHEST 2V
1 series · 2 of 2 positions shown · non-contrast
Comparison: [DATE]

CLINICAL DATA: Abnormal ECG.

EXAM:
CHEST - 2 VIEW

[Series 1: dg chest 2 view · 0.14mm/px · 2 of 2 slices shown]
[im 1/2]
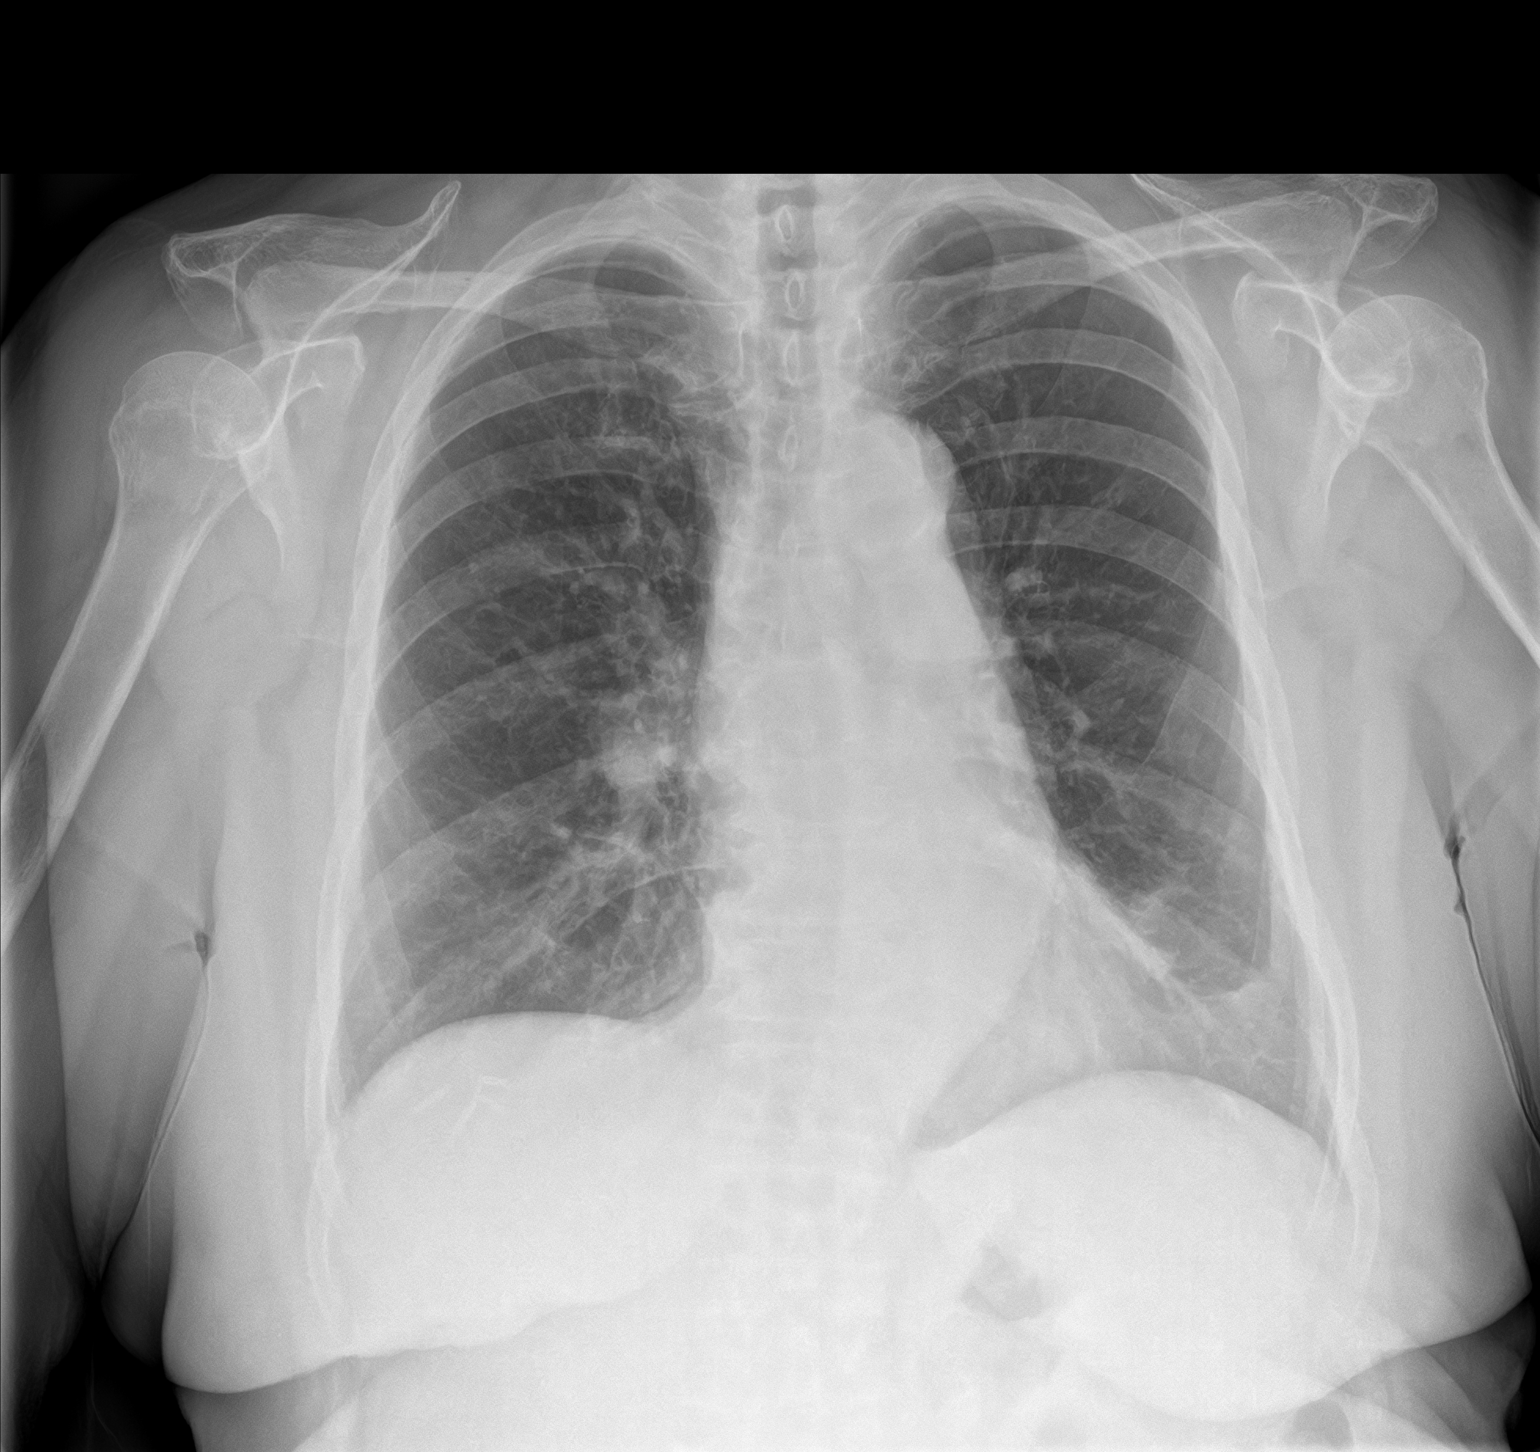
[im 2/2]
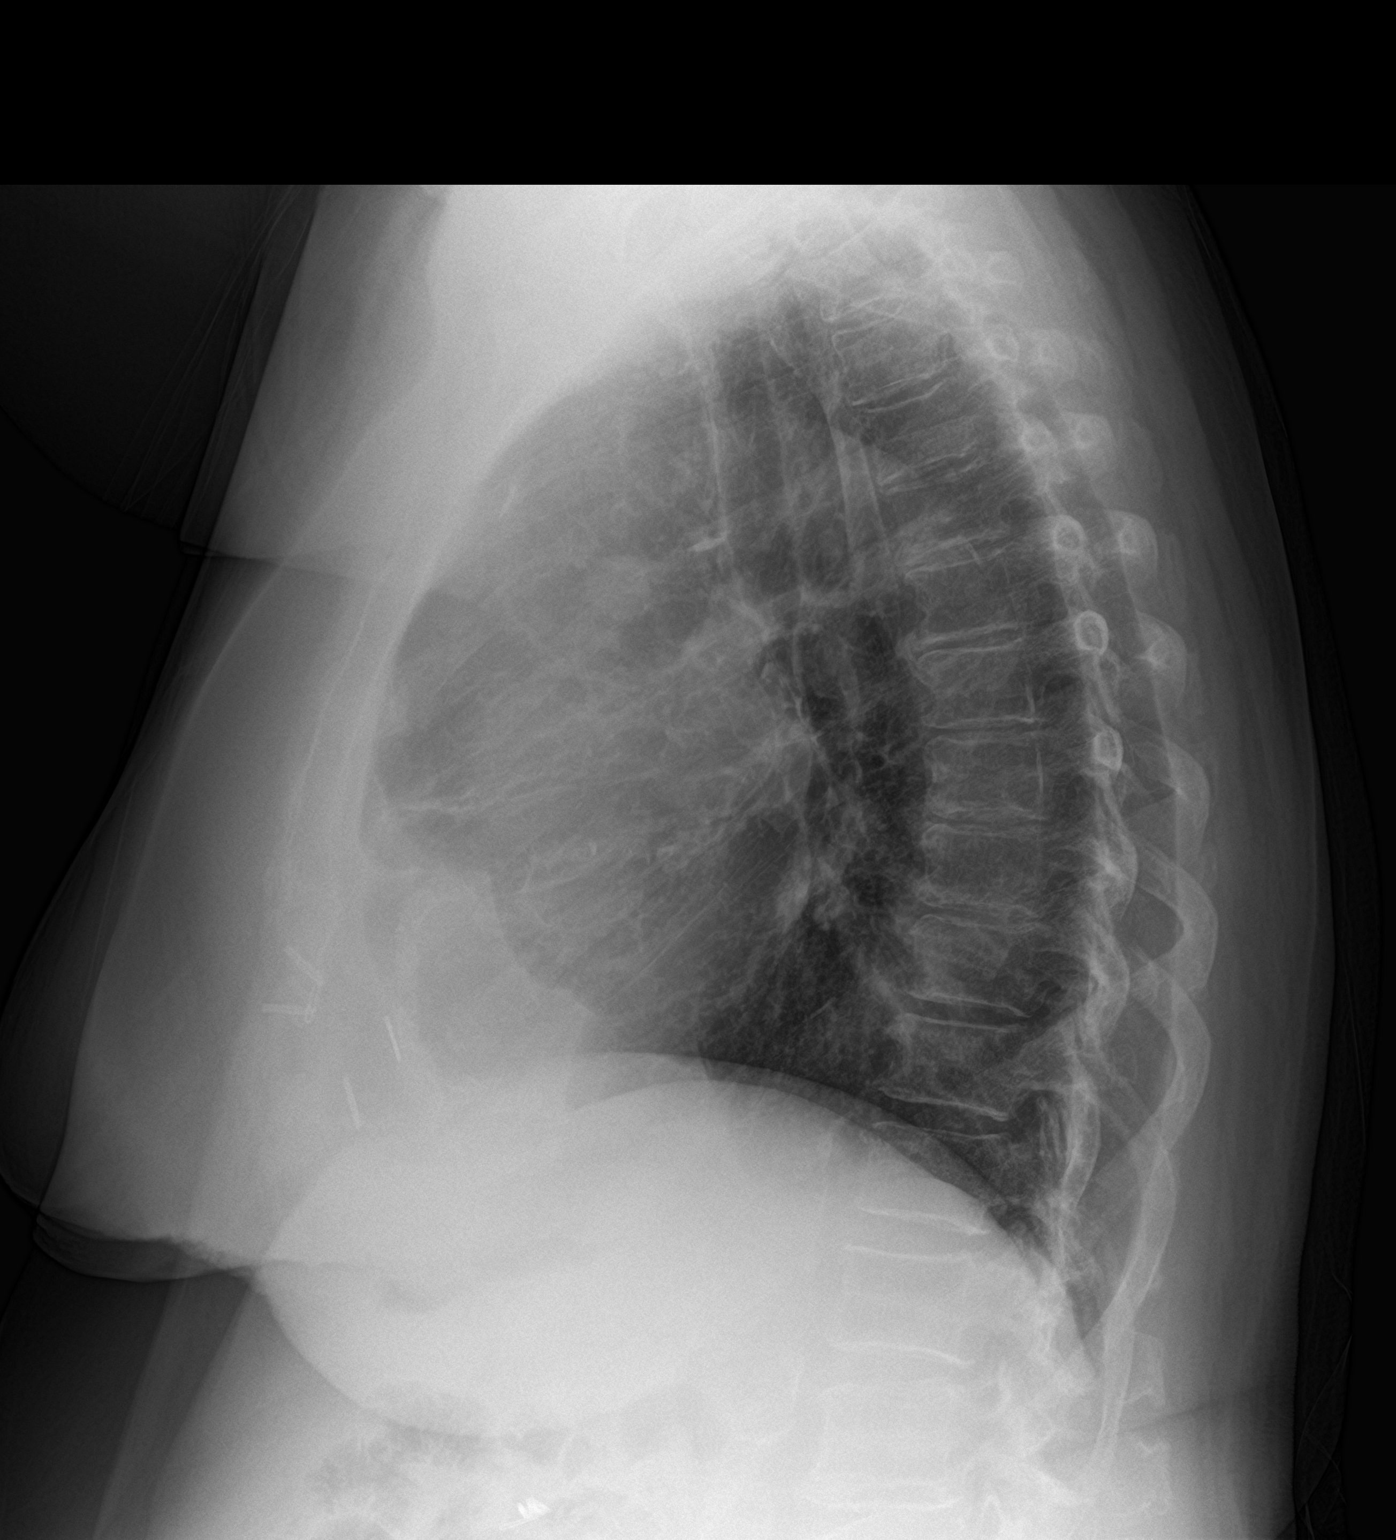

[2 of 2 positions shown; findings below may reference images not displayed]

FINDINGS: Heart size and vascularity normal. Negative for heart failure. Mild
scarring in the lingula unchanged. Lungs otherwise clear without
infiltrate or effusion.
IMPRESSION: No active cardiopulmonary disease.

## 2020-03-01 MED ORDER — LACTATED RINGERS IV BOLUS
500.0000 mL | Freq: Once | INTRAVENOUS | Status: AC
  Administered 2020-03-01: 500 mL via INTRAVENOUS

## 2020-03-01 MED ORDER — INSULIN ASPART 100 UNIT/ML ~~LOC~~ SOLN
0.0000 [IU] | SUBCUTANEOUS | Status: DC
  Administered 2020-03-01: 11 [IU] via SUBCUTANEOUS
  Filled 2020-03-01: qty 1

## 2020-03-01 MED ORDER — LEVOTHYROXINE SODIUM 88 MCG PO TABS: 88.0000 ug | ORAL_TABLET | Freq: Every day | ORAL | 11 refills | Status: DC

## 2020-03-01 MED ORDER — CLONAZEPAM 0.5 MG PO TABS: 1.0000 mg | ORAL_TABLET | Freq: Two times a day (BID) | ORAL | Status: DC

## 2020-03-01 MED ORDER — LEVOTHYROXINE SODIUM 88 MCG PO TABS: 88.0000 ug | ORAL_TABLET | Freq: Every day | ORAL | 1 refills | Status: DC

## 2020-03-01 MED ORDER — ENALAPRIL MALEATE 10 MG PO TABS
5.0000 mg | ORAL_TABLET | Freq: Two times a day (BID) | ORAL | Status: DC
  Administered 2020-03-01: 5 mg via ORAL

## 2020-03-01 MED ORDER — LEVOTHYROXINE SODIUM 88 MCG PO TABS
88.0000 ug | ORAL_TABLET | Freq: Once | ORAL | Status: AC
  Administered 2020-03-01: 88 ug via ORAL
  Filled 2020-03-01 (×3): qty 1

## 2020-03-01 NOTE — ED Triage Notes (Signed)
Pt states that her MD stated her thyroid is low and sugar is high and she needed to be seen. Pt denies any SI/HI/AVH. Pt states that she always takes her medications and never misses a dose. Pt is upset about MD telling people she doesn't take her meds when she does. Calm, cooperative, AOx4.

## 2020-03-01 NOTE — ED Provider Notes (Signed)
[facility] Emergency Department Provider Note  ____________________________________________   First MD Initiated Contact with Patient 03/01/20 1323     (approximate)  I have reviewed the triage vital signs and the nursing notes.   HISTORY  Chief Complaint Psych Evaluation   HPI [name] is a 74 y.o. female with a past medical history of HTN, HDL, DM, hypothyroidism, breast cancer not currently undergoing any treatment, arthritis, and bipolar disorder who presents to the ED after being referred by [facility] for further assessment of elevated glucose and elevated TSH as well as possible psychiatric concerns.  I did call [facility] to try to speak with referring provider but was able to speak with referring provider.  Patient states she is not sure why she was sent to the emergency room as other than some mild shortness of breath over the last couple of weeks she has no acute symptoms including any fevers, chills, cough, chest pain, headache, earache, sore throat, nausea, vomiting, diarrhea, dysuria, rash, extremity pain, or other acute complaints.  She denies tobacco abuse, EtOH use, illicit drug use.  She states she takes all of her medications including her diabetes  medications as prescribed.  However she notes that she ran out of her levothyroxine 2 days ago but otherwise has been taking it.  She denies any SI, HI, hallucinations, or any significant psychiatric concerns.         Past Medical History:  Diagnosis Date  . Arthritis   . Breast cancer ([place])   . Cancer [facility]) 2004   right breast ca  . Diabetes mellitus without complication ([place])   . Hypercholesterolemia   . Hypertension   . Hypothyroid   . Seasonal allergies     Patient Active Problem List   Diagnosis Date Noted  . Bipolar disorder current episode depressed ([place]) 01/07/2020  . MDD (major depressive disorder) 08/10/2019  . MDD (major depressive disorder), recurrent episode, severe ([place])  06/24/2019  . Pain due to onychomycosis of toenails of both feet 11/23/2018  . Bipolar 1 disorder, manic, moderate ([place]) 07/21/2018  . Bipolar 1 disorder, mixed, severe ([place]) 07/21/2018  . Bipolar 1 disorder with moderate mania ([place]) 07/20/2018  . Severe bipolar I disorder, current or most recent episode depressed ([place]) 07/07/2017  . PTSD (post-traumatic stress disorder) 07/07/2017  . 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 Diabetes ([place]) 09/23/2014  . Essential hypertension 09/21/2014  . Hypothyroidism 09/21/2014  . Dyslipidemia 09/21/2014  . Bipolar I disorder, current or most recent episode manic, severe with mixed features ([place]) 09/20/2014    Past Surgical History:  Procedure Laterality Date  . ABDOMINAL HYSTERECTOMY    . BREAST BIOPSY Left    neg  . BREAST EXCISIONAL BIOPSY Right 2004   breast ca and lumpectomy  . BREAST LUMPECTOMY Right 2004   breast ca  . CHOLECYSTECTOMY    . COLONOSCOPY WITH PROPOFOL N/A 02/13/2017   Procedure: COLONOSCOPY WITH PROPOFOL;  Surgeon: Jonathon Bellows, MD;  Location: [facility] ENDOSCOPY;  Service: Gastroenterology;  Laterality: N/A;  . KIDNEY STONE SURGERY      Prior to Admission medications   Medication Sig Start Date End Date Taking? Authorizing Provider  ACCU-CHEK AVIVA PLUS test strip  07/26/19   [provider]  Accu-Chek Softclix Lancets lancets  07/26/19   [provider]  clonazePAM (KLONOPIN) 1 MG tablet Take 1 tablet (1 mg total) by mouth 2 (two) times daily. 01/11/20   Clapacs, Madie Reno, MD  divalproex (DEPAKOTE ER) 500 MG 24 hr tablet Take 2 tablets (1,000 mg total) by mouth at bedtime. 01/11/20   Clapacs, Madie Reno, MD  enalapril (VASOTEC) 5 MG tablet Take 1 tablet (5 mg total) by mouth 2 (two) times daily. 01/11/20   Clapacs, Madie Reno, MD  FLUoxetine (PROZAC) 20 MG capsule Take 3 capsules (60 mg total) by mouth daily. 01/11/20 02/10/20  Clapacs, Madie Reno, MD  glipiZIDE (GLUCOTROL XL) 10 MG 24 hr  tablet Take 1 tablet (10 mg total) by mouth daily with breakfast. 01/12/20   Clapacs, Madie Reno, MD  glipiZIDE (GLUCOTROL XL) 5 MG 24 hr tablet Take 1 tablet (5 mg total) by mouth at bedtime. 01/11/20   Clapacs, Madie Reno, MD  levothyroxine (SYNTHROID) 88 MCG tablet Take 1 tablet (88 mcg total) by mouth daily. 03/01/20 03/01/21  Lucrezia Starch, MD  linagliptin (TRADJENTA) 5 MG TABS tablet Take 1 tablet (5 mg total) by mouth 2 (two) times daily. 01/11/20   Clapacs, Madie Reno, MD  loratadine (CLARITIN) 10 MG tablet Take 1 tablet (10 mg total) by mouth daily. 01/11/20   Clapacs, Madie Reno, MD  lurasidone (LATUDA) 20 MG TABS tablet Take 1 tablet (20 mg total) by mouth daily with breakfast. 01/12/20   Clapacs, Madie Reno, MD  rosuvastatin (CRESTOR) 40 MG tablet Take 1 tablet (40 mg total) by mouth daily. 01/11/20   Clapacs, Madie Reno, MD    Allergies Navane [thiothixene] and Penicillins  Family History  Problem Relation Age of Onset  . Bladder Cancer Neg Hx   . Kidney cancer Neg Hx     Social History Social History   Tobacco Use  . Smoking status: Former Smoker    Types: Cigarettes  . Smokeless tobacco: Never Used  Vaping Use  . Vaping Use: Never used  Substance Use Topics  . Alcohol use: No  . Drug use: No    Review of Systems  Review of Systems  Constitutional: Negative for chills and fever.  HENT: Negative for sore throat.   Eyes: Negative for pain.  Respiratory: Negative for cough and stridor.   Cardiovascular: Negative for chest pain.  Gastrointestinal: Negative for vomiting.  Genitourinary: Negative for dysuria.  Musculoskeletal: Negative for myalgias.  Skin: Negative for rash.  Neurological: Negative for seizures, loss of consciousness and headaches.  Psychiatric/Behavioral: Negative for hallucinations, substance abuse and suicidal ideas.  All other systems reviewed and are negative.     ____________________________________________   PHYSICAL EXAM:  VITAL SIGNS: ED Triage Vitals   Enc Vitals Group     BP 03/01/20 1256 (!) 160/92     Pulse Rate 03/01/20 1256 77     Resp 03/01/20 1256 20     Temp 03/01/20 1256 97.9 F (36.6 C)     Temp Source 03/01/20 1256 Oral     SpO2 03/01/20 1256 93 %     Weight 03/01/20 1257 170 lb (77.1 kg)     Height 03/01/20 1257 5\' 1"  (1.549 m)     Head Circumference --      Peak Flow --      Pain Score 03/01/20 1311 0     Pain Loc --      Pain Edu? --      Excl. in [place]? --    Vitals:   03/01/20 1256  BP: (!) 160/92  Pulse: 77  Resp: 20  Temp: 97.9 F (36.6 C)  SpO2: 93%   Physical Exam Vitals and nursing note reviewed.  Constitutional:  General: She is not in acute distress.    Appearance: She is well-developed.  HENT:     Head: Normocephalic and atraumatic.     Right Ear: External ear normal.     Left Ear: External ear normal.     Nose: Nose normal.  Eyes:     Conjunctiva/sclera: Conjunctivae normal.  Cardiovascular:     Rate and Rhythm: Normal rate and regular rhythm.     Heart sounds: No murmur heard.   Pulmonary:     Effort: Pulmonary effort is normal. No respiratory distress.     Breath sounds: Normal breath sounds.  Abdominal:     Palpations: Abdomen is soft.     Tenderness: There is no abdominal tenderness.  Musculoskeletal:     Cervical back: Neck supple.  Skin:    General: Skin is warm and dry.  Neurological:     Mental Status: She is alert and oriented to person, place, and time.  Psychiatric:        Mood and Affect: Mood normal.      ____________________________________________   LABS (all labs ordered are listed, but only abnormal results are displayed)  Labs Reviewed  GLUCOSE, CAPILLARY - Abnormal; Notable for the following components:      Result Value   Glucose-Capillary 321 (*)    All other components within normal limits  COMPREHENSIVE METABOLIC PANEL - Abnormal; Notable for the following components:   Sodium 134 (*)    Chloride 97 (*)    Glucose, Bld 335 (*)    BUN 33 (*)     Creatinine, Ser 1.52 (*)    GFR, Estimated 33 (*)    All other components within normal limits  SALICYLATE LEVEL - Abnormal; Notable for the following components:   Salicylate Lvl <7.3 (*)    All other components within normal limits  ACETAMINOPHEN LEVEL - Abnormal; Notable for the following components:   Acetaminophen (Tylenol), Serum <10 (*)    All other components within normal limits  CBC - Abnormal; Notable for the following components:   RBC 5.54 (*)    Hemoglobin 16.2 (*)    HCT 48.7 (*)    All other components within normal limits  TSH - Abnormal; Notable for the following components:   TSH 22.532 (*)    All other components within normal limits  VALPROIC ACID LEVEL - Abnormal; Notable for the following components:   Valproic Acid Lvl 111 (*)    All other components within normal limits  GLUCOSE, CAPILLARY - Abnormal; Notable for the following components:   Glucose-Capillary 318 (*)    All other components within normal limits  RESPIRATORY PANEL BY RT PCR (FLU A&B, COVID)  ETHANOL  URINE DRUG SCREEN, QUALITATIVE ([facility] ONLY)  FIBRIN DERIVATIVES D-DIMER ([facility] ONLY)  T4, FREE  MAGNESIUM  BRAIN NATRIURETIC PEPTIDE  TROPONIN I (HIGH SENSITIVITY)   ____________________________________________  EKG  Sinus rhythm with a ventricular rate of 60, normal axis, unremarkable intervals, T wave inversions in anterior and lateral leads that appear new when compared to prior. ____________________________________________  RADIOLOGY  ED MD interpretation: No focal consolidation, pneumothorax, edema, effusion, or other acute thoracic process.  Official radiology report(s): DG Chest 2 View  Result Date: 03/01/2020 CLINICAL DATA:  Abnormal ECG. EXAM: CHEST - 2 VIEW COMPARISON:  09/01/2019 FINDINGS: Heart size and vascularity normal. Negative for heart failure. Mild scarring in the lingula unchanged. Lungs otherwise clear without infiltrate or effusion. IMPRESSION: No active  cardiopulmonary disease. Electronically Signed   By: Juanda Crumble  Carlis Abbott M.D.   On: 03/01/2020 14:43    ____________________________________________   PROCEDURES  Procedure(s) performed (including Critical Care):  Procedures   ____________________________________________   INITIAL IMPRESSION / ASSESSMENT AND PLAN / ED COURSE      Patient presents with [place] to history exam for further assessment of hyperglycemia and elevated thyroid outpatient tests as well as concern for possible psychiatric illness as described above. On arrival patient is slightly hypertensive with a BP of 160/92, borderline hypoxic with SPO2 saturation of 93% and borderline tachypneic with a respiratory rate of 20 and otherwise stable vital signs on room air. Exam as above remarkable for lungs clear bilaterally patient does not suicidal, homicidal, psychotic, acutely intoxicated, and is oriented.  Unclear precise psychiatric concerns per triage notes from outpatient provider although I do not believe patient on presentation with information available at this time warrants emergent psychiatric evaluation.  CMP obtained shows mild AKI with a creatinine of 1.52 which is compared to that obtained 1 month ago at 1.17. Patient is also noted to be hyperglycemic with a glucose of 335 with no evidence of acidosis or other significant lateral or metabolic derangements. She is noted to have an elevated TSH today which I suspect is related to patient being out of her levothyroxine over the last couple of days. She does not appear to be in myxedema coma or thyrotoxic. She was given 1 dose of her levothyroxine in the ED and this medication was refilled for the patient. Routine psychiatric labs were sent prior to my assessment due to psychiatric concerns and these were all unremarkable including serum acetaminophen/salicylate /ethanol/UDS.  Depakote level is just above therapeutic threshold at 111.  I advised patient to withhold her dose  tomorrow and have the level rechecked by her PCP her prescribing doctor in the next 3 to 5 days.  With regard to her shortness of breath differential includes but is not limited to pneumonia, pneumothorax, CHF, ACS, anemia, and PE.  No evidence of anemia on CBC.  Patient appear volume overloaded on exam or chest x-ray and BNP is WNL.  Low suspicion for PE as patient's D-dimer is less than 500.  While EKG does have some nonspecific ST changes, I have low  suspicion for ACS at this time given patient denies any chest pain and has a nonelevated troponin.  Covid PCR sent.  Given patient denies any current chest pain or shortness of breath with otherwise reassuring work-up I do a low suspicion for immediate life-threatening pathology and believe patient is safe for discharge with plan for continued outpatient evaluation and follow-up.  She was given IV fluids and some subcu insulin for dehydration and hyperglycemia.  Refill was written for Synthroid.  Patient discharged stable condition.  Strict return precautions advised and discussed.    ____________________________________________   FINAL CLINICAL IMPRESSION(S) / ED DIAGNOSES  Final diagnoses:  Dehydration  AKI (acute kidney injury) ([place])  Hypertension, unspecified type  Hyperglycemia  TSH elevation  Person under investigation for COVID-19  Medication refill    Medications  insulin aspart (novoLOG) injection 0-15 Units (11 Units Subcutaneous Given 03/01/20 1502)  enalapril (VASOTEC) tablet 5 mg (5 mg Oral Given 03/01/20 1516)  clonazePAM (KLONOPIN) tablet 1 mg (has no administration in time range)  levothyroxine (SYNTHROID) tablet 88 mcg (has no administration in time range)  lactated ringers bolus 500 mL (500 mLs Intravenous New Bag/Given 03/01/20 1508)     ED Discharge Orders         Ordered  levothyroxine (SYNTHROID) 88 MCG tablet  Daily        03/01/20 1514           Note:  This document was prepared using Dragon voice  recognition software and may include unintentional dictation errors.   Lucrezia Starch, MD 03/01/20 [phone]

## 2020-03-01 NOTE — ED Triage Notes (Addendum)
Pt here with c/o mental evaluation from [place].  MD called and informed this RN that pt needs to be seen mentally for not taking medications. Pt needs psych evalu. Pt lives alone and MD feels pt needs possible placement.  Pt has hx of schizo. Current A1C is 11.9 per MD.

## 2020-03-01 NOTE — ED Notes (Signed)
IV line placed. Labs drawn and sent. covid swab obtained. Patient off uniit to xray.

## 2020-03-01 NOTE — ED Notes (Signed)
EKG completed

## 2020-04-06 ENCOUNTER — Emergency Department: Payer: Medicare HMO

## 2020-04-06 ENCOUNTER — Other Ambulatory Visit: Payer: Self-pay

## 2020-04-06 ENCOUNTER — Emergency Department
Admission: EM | Admit: 2020-04-06 | Discharge: 2020-04-06 | Disposition: A | Discharge status: Home / Self Care | Payer: Medicare HMO | Attending: Emergency Medicine | Admitting: Emergency Medicine

## 2020-04-06 DIAGNOSIS — E039 Hypothyroidism, unspecified: Secondary | ICD-10-CM | POA: Diagnosis not present

## 2020-04-06 DIAGNOSIS — I10 Essential (primary) hypertension: Secondary | ICD-10-CM | POA: Insufficient documentation

## 2020-04-06 DIAGNOSIS — M4807 Spinal stenosis, lumbosacral region: Secondary | ICD-10-CM | POA: Diagnosis not present

## 2020-04-06 DIAGNOSIS — E119 Type 2 diabetes mellitus without complications: Secondary | ICD-10-CM | POA: Insufficient documentation

## 2020-04-06 DIAGNOSIS — G8929 Other chronic pain: Secondary | ICD-10-CM

## 2020-04-06 DIAGNOSIS — Z853 Personal history of malignant neoplasm of breast: Secondary | ICD-10-CM | POA: Diagnosis not present

## 2020-04-06 DIAGNOSIS — R531 Weakness: Secondary | ICD-10-CM | POA: Diagnosis present

## 2020-04-06 DIAGNOSIS — M545 Low back pain, unspecified: Secondary | ICD-10-CM

## 2020-04-06 DIAGNOSIS — Z79899 Other long term (current) drug therapy: Secondary | ICD-10-CM | POA: Insufficient documentation

## 2020-04-06 DIAGNOSIS — Z87891 Personal history of nicotine dependence: Secondary | ICD-10-CM | POA: Diagnosis not present

## 2020-04-06 DIAGNOSIS — Z7984 Long term (current) use of oral hypoglycemic drugs: Secondary | ICD-10-CM | POA: Insufficient documentation

## 2020-04-06 LAB — CBC
HCT: 42.4 % (ref 36.0–46.0)
Hemoglobin: 14.2 g/dL (ref 12.0–15.0)
MCH: 29.3 pg (ref 26.0–34.0)
MCHC: 33.5 g/dL (ref 30.0–36.0)
MCV: 87.6 fL (ref 80.0–100.0)
Platelets: 186 10*3/uL (ref 150–400)
RBC: 4.84 MIL/uL (ref 3.87–5.11)
RDW: 13 % (ref 11.5–15.5)
WBC: 7.2 10*3/uL (ref 4.0–10.5)
nRBC: 0 % (ref 0.0–0.2)

## 2020-04-06 LAB — BASIC METABOLIC PANEL
Anion gap: 11 (ref 5–15)
BUN: 28 mg/dL — ABNORMAL HIGH (ref 8–23)
CO2: 24 mmol/L (ref 22–32)
Calcium: 9.1 mg/dL (ref 8.9–10.3)
Chloride: 99 mmol/L (ref 98–111)
Creatinine, Ser: 1.34 mg/dL — ABNORMAL HIGH (ref 0.44–1.00)
GFR, Estimated: 42 mL/min — ABNORMAL LOW (ref >60)
Glucose, Bld: 357 mg/dL — ABNORMAL HIGH (ref 70–99)
Potassium: 4.1 mmol/L (ref 3.5–5.1)
Sodium: 134 mmol/L — ABNORMAL LOW (ref 135–145)

## 2020-04-06 LAB — HEPATIC FUNCTION PANEL
ALT: 12 U/L (ref 0–44)
AST: 13 U/L — ABNORMAL LOW (ref 15–41)
Albumin: 3.5 g/dL (ref 3.5–5.0)
Alkaline Phosphatase: 42 U/L (ref 38–126)
Bilirubin, Direct: <0.1 mg/dL (ref 0.0–0.2)
Total Bilirubin: 0.6 mg/dL (ref 0.3–1.2)
Total Protein: 7 g/dL (ref 6.5–8.1)

## 2020-04-06 LAB — URINALYSIS, COMPLETE (UACMP) WITH MICROSCOPIC
Bacteria, UA: NONE SEEN
Bilirubin Urine: NEGATIVE
Glucose, UA: >=500 mg/dL — AB
Ketones, ur: 5 mg/dL — AB
Leukocytes,Ua: NEGATIVE
Nitrite: NEGATIVE
Protein, ur: 30 mg/dL — AB
Specific Gravity, Urine: 1.027 (ref 1.005–1.030)
pH: 6 (ref 5.0–8.0)

## 2020-04-06 LAB — SEDIMENTATION RATE: Sed Rate: 6 mm/hr (ref 0–30)

## 2020-04-06 LAB — TROPONIN I (HIGH SENSITIVITY)
Troponin I (High Sensitivity): 4 ng/L (ref <18)
Troponin I (High Sensitivity): 5 ng/L (ref <18)

## 2020-04-06 LAB — VALPROIC ACID LEVEL: Valproic Acid Lvl: 60 ug/mL (ref 50.0–100.0)

## 2020-04-06 IMAGING — CT CT HEAD W/O CM
3 series · 16 of 46 positions shown, 19 images · non-contrast
Comparison: Head CT dated [DATE].

CLINICAL DATA: 74-year-old female with neurologic deficit.

EXAM:
CT HEAD WITHOUT CONTRAST
TECHNIQUE: Contiguous axial images were obtained from the base of the skull
through the vertex without intravenous contrast.

[Series 3: head wo · axial · 0.42mm/px · z∈[-157,-37]mm · 10 of 29 slices shown, 13 images]
[im 3/29  brain]
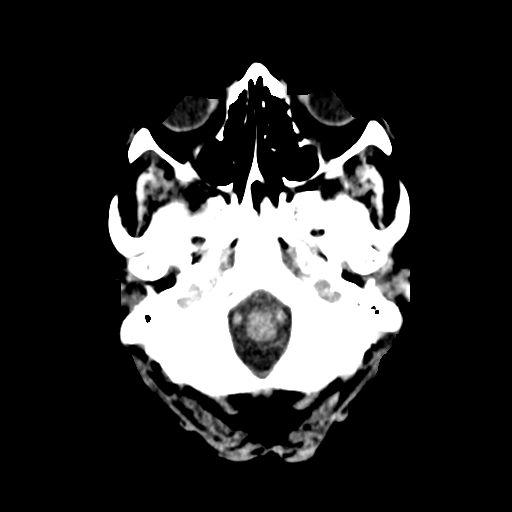
[im 3/29  bone]
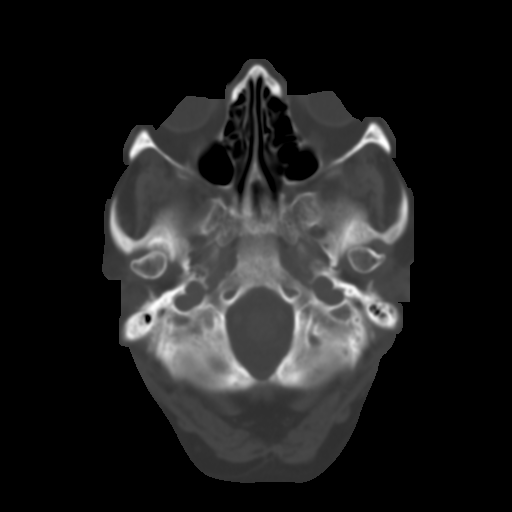
[im 6/29  brain]
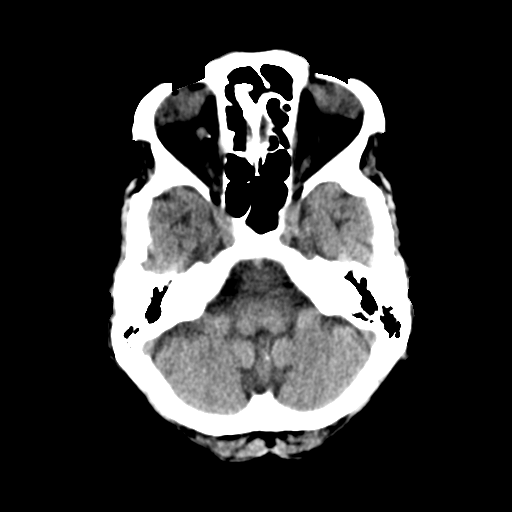
[im 8/29  brain]
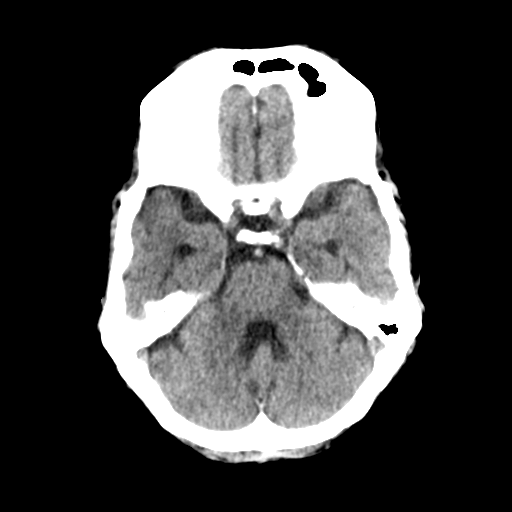
[im 11/29  brain]
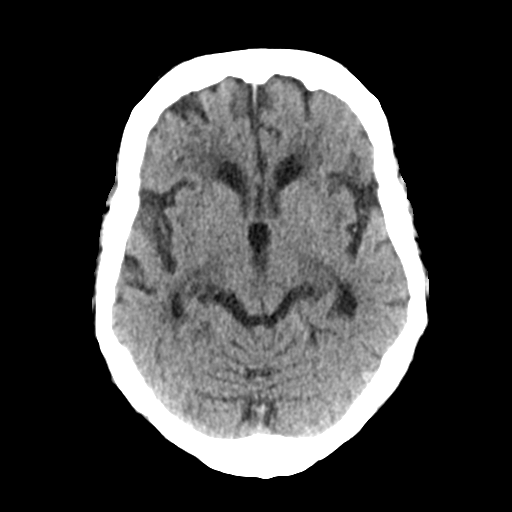
[im 14/29  brain]
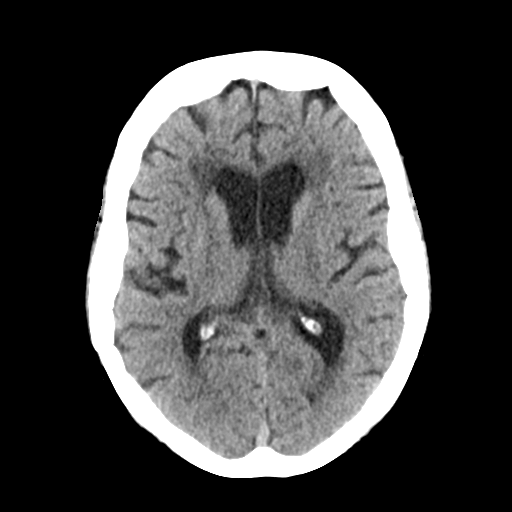
[im 14/29  bone]
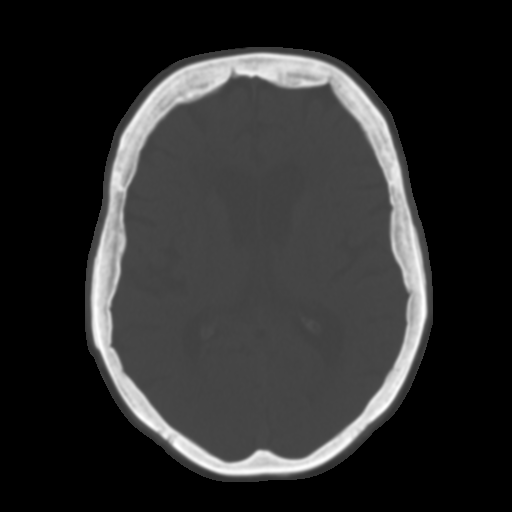
[im 16/29  brain]
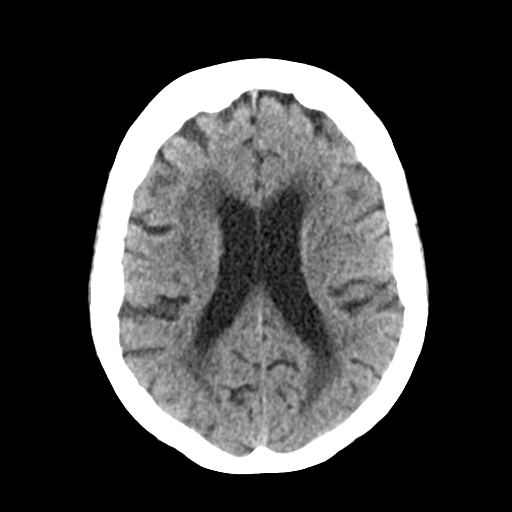
[im 19/29  brain]
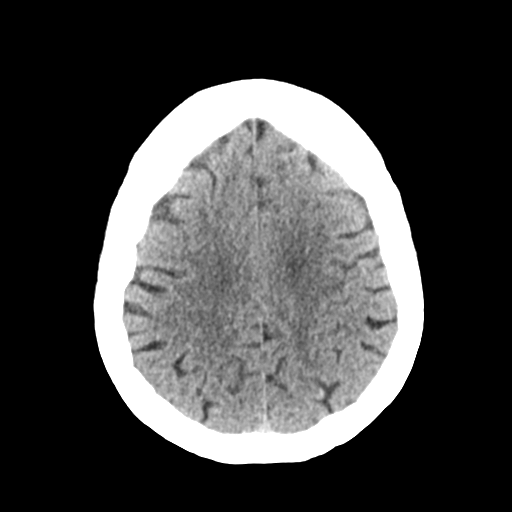
[im 22/29  brain]
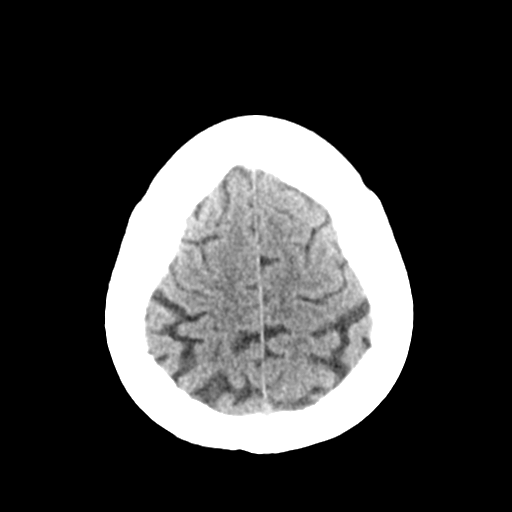
[im 24/29  brain]
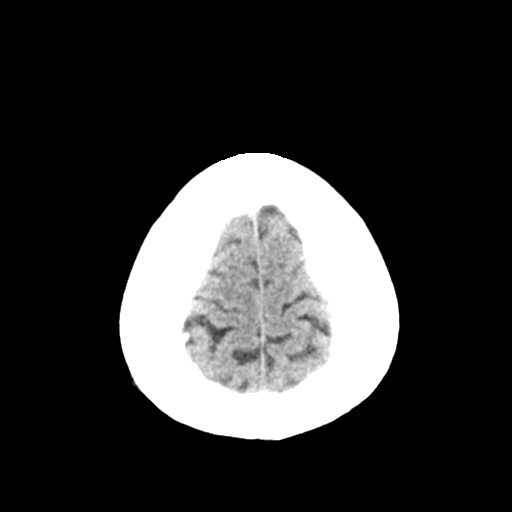
[im 24/29  bone]
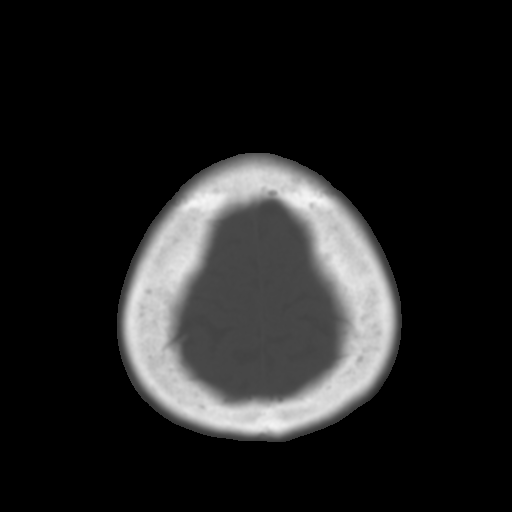
[im 27/29  brain]
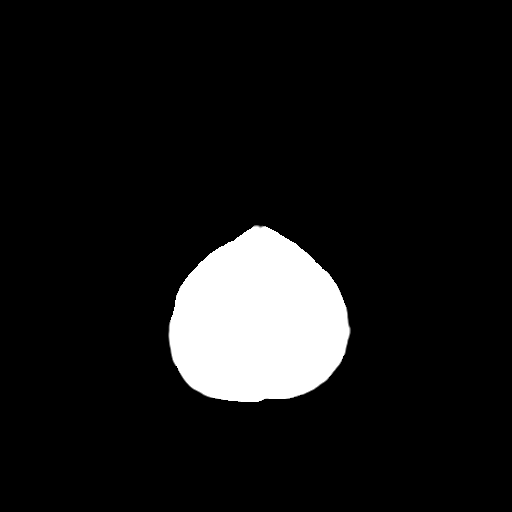

[Series 4: coronal soft tissue · coronal · 0.30mm/px · 3 of 67 slices shown]
[im 23/67  brain]
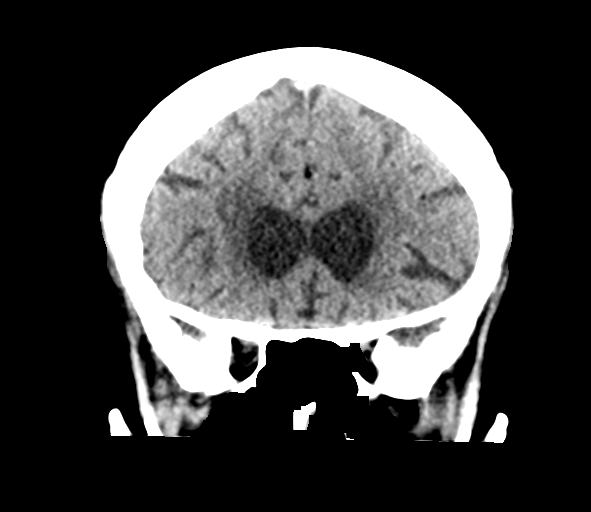
[im 30/67  brain]
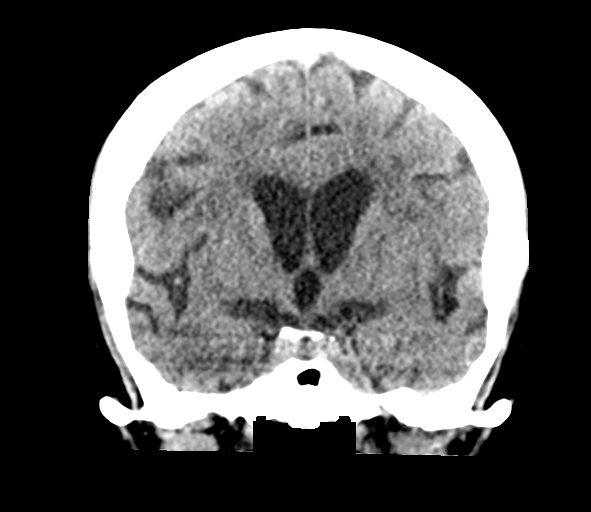
[im 37/67  brain]
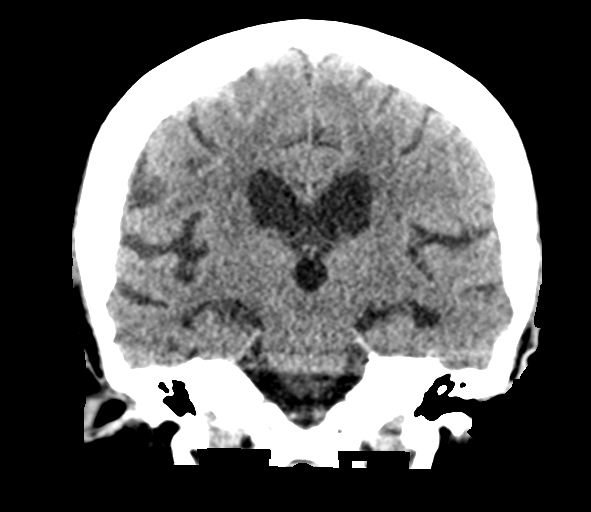

[Series 5: sagittal soft tissue · sagittal · 0.33mm/px · 3 of 50 slices shown]
[im 17/50  brain]
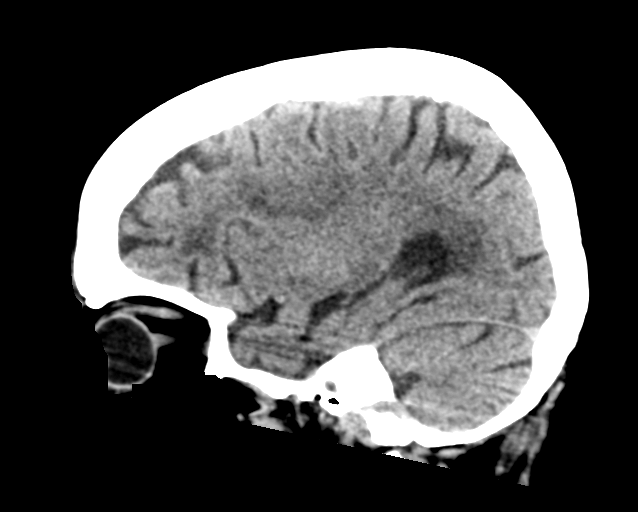
[im 25/50  brain]
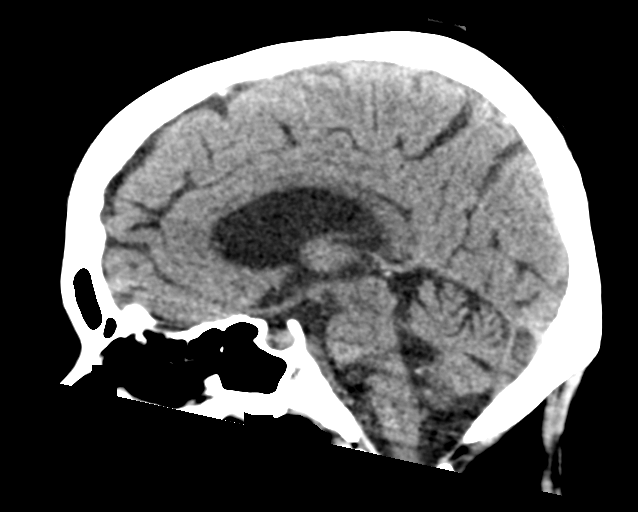
[im 33/50  brain]
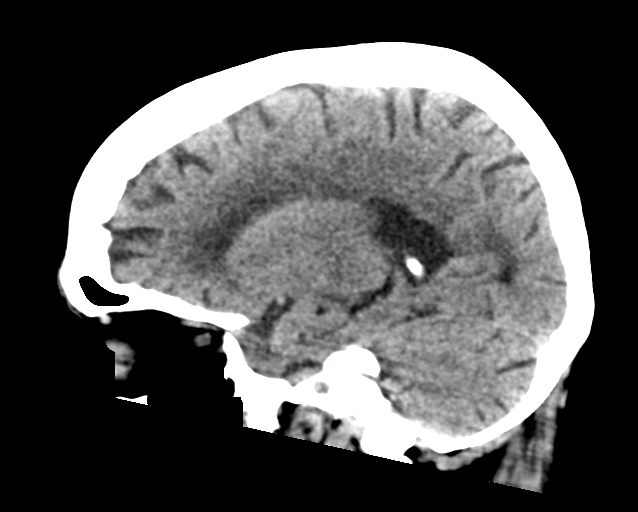

[16 of 46 positions shown; findings below may reference images not displayed]

FINDINGS: Brain: Mild age-related atrophy and chronic microvascular ischemic
changes. There is no acute intracranial hemorrhage. No mass effect
midline shift. No extra-axial fluid collection.

Vascular: No hyperdense vessel or unexpected calcification.

Skull: Normal. Negative for fracture or focal lesion.

Sinuses/Orbits: No acute finding.

Other: None
IMPRESSION: 1. No acute intracranial pathology.
2. Mild age-related atrophy and chronic microvascular ischemic
changes.

## 2020-04-06 IMAGING — MR MR HEAD W/O CM
11 series · 42 of 48 positions shown · non-contrast
Comparison: Prior head CT examinations [DATE] and earlier.

CLINICAL DATA: Neuro deficit, acute, stroke suspected; subacute
neuro deficits/leg weakness, falling to the right.

EXAM:
MRI HEAD WITHOUT CONTRAST
TECHNIQUE: Multiplanar, multiecho pulse sequences of the brain and surrounding
structures were obtained without intravenous contrast.

[Series 5: ax dwi_tracew · axial · 3.0mm · 0.60mm/px · z∈[-132,+23]mm · 5 of 48 slices shown]
[im 1/48]
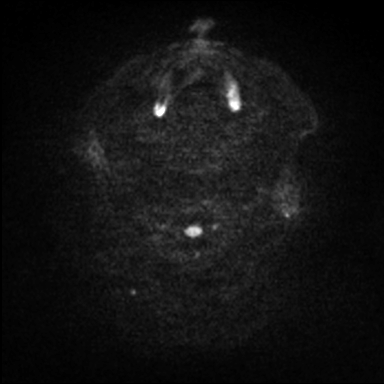
[im 12/48]
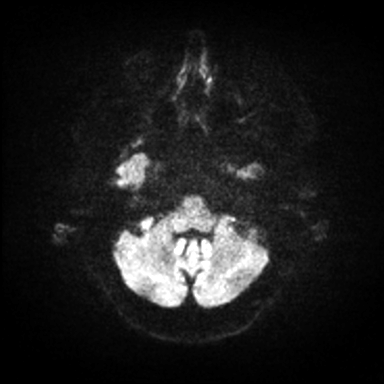
[im 24/48]
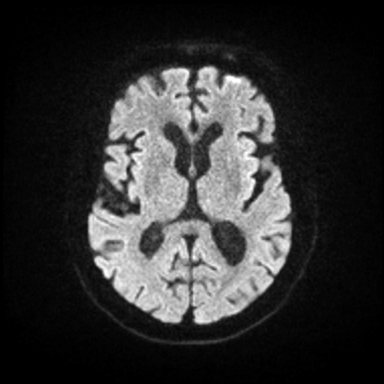
[im 36/48]
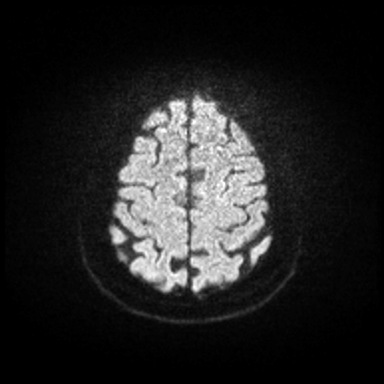
[im 48/48]
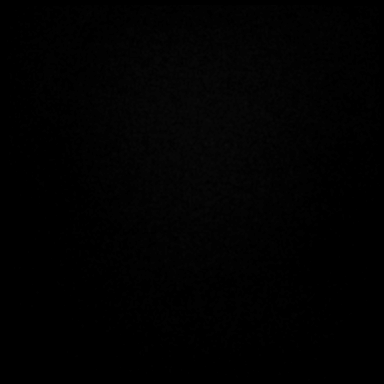

[Series 6: ax dwi_adc · axial · 3.0mm · 0.60mm/px · z∈[-132,+10]mm · 4 of 44 slices shown]
[im 1/44]
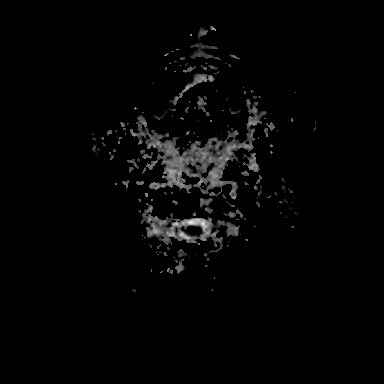
[im 15/44]
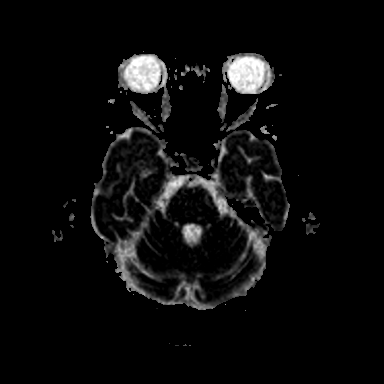
[im 29/44]
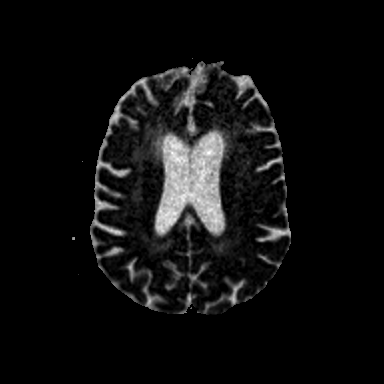
[im 44/44]
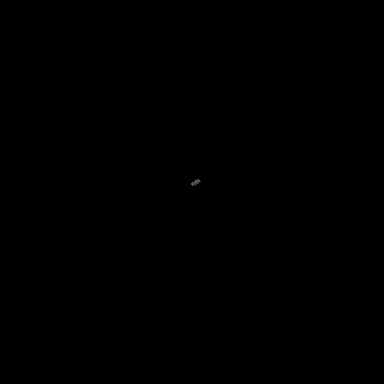

[Series 7: cor dwi_tracew · coronal · 5.0mm · 0.60mm/px · 3 of 40 slices shown]
[im 1/40]
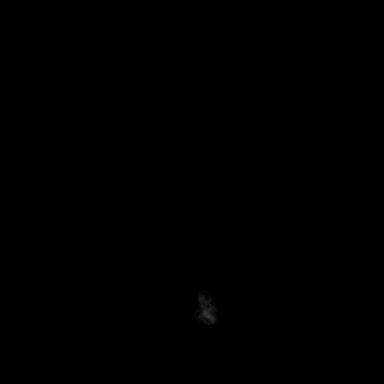
[im 20/40]
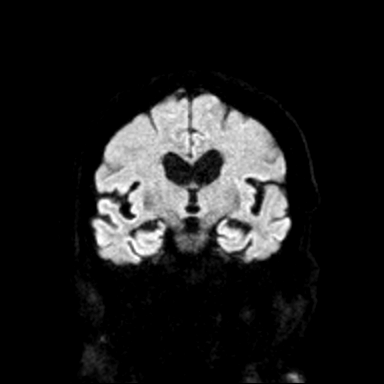
[im 40/40]
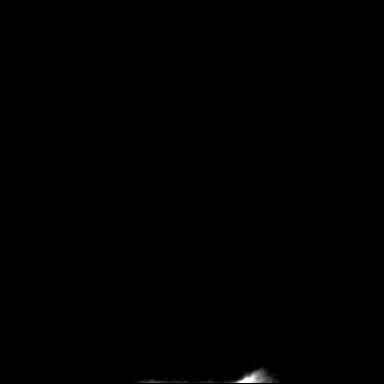

[Series 8: cor dwi_adc · coronal · 5.0mm · 0.60mm/px · 3 of 40 slices shown]
[im 1/40]
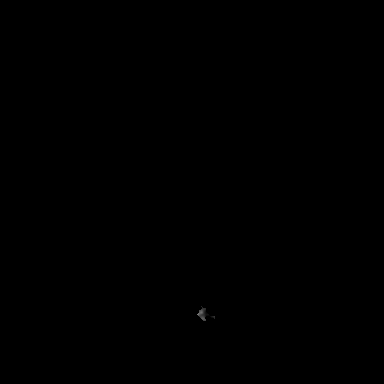
[im 20/40]
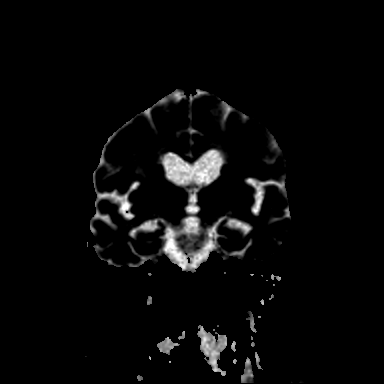
[im 40/40]
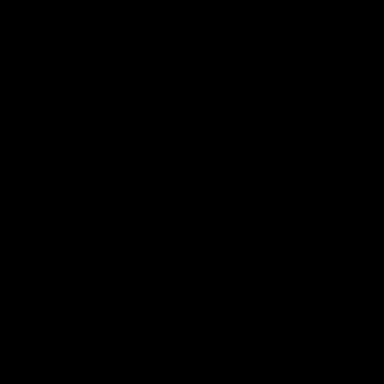

[Series 9: T1 · sagittal · 5.0mm · 0.62mm/px · 2 of 25 slices shown (1 of 2)]
[im 1/25]
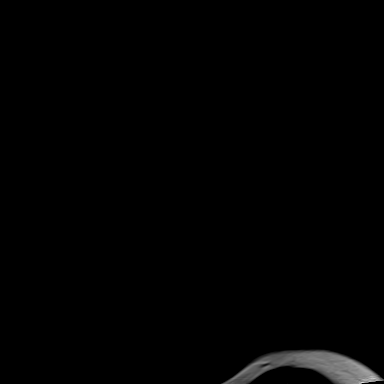
[im 25/25]
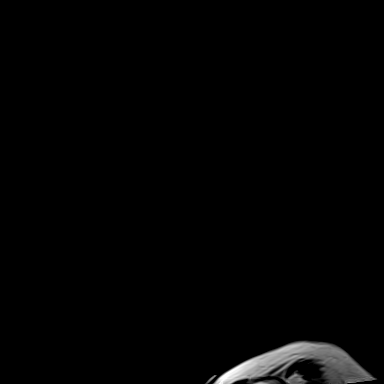

[Series 10: T2 · axial · 5.0mm · 0.53mm/px · z∈[-126,+18]mm · 2 of 25 slices shown (1 of 2)]
[im 1/25]
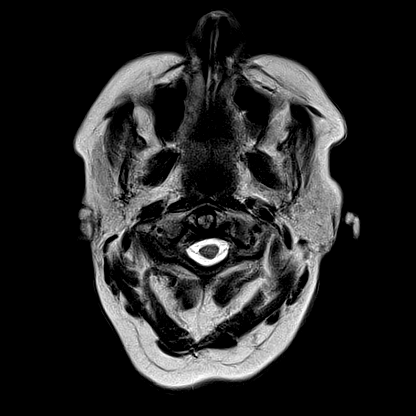
[im 25/25]
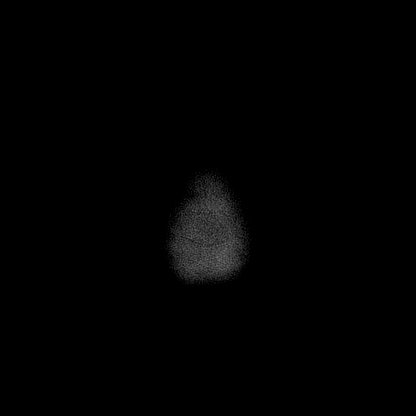

[Series 12: pha_images · axial · 3.0mm · 0.90mm/px · z∈[-130,+31]mm · 4 of 54 slices shown]
[im 1/54]
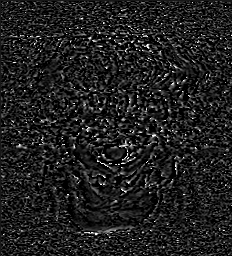
[im 18/54]
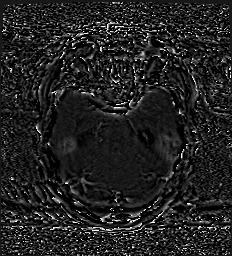
[im 36/54]
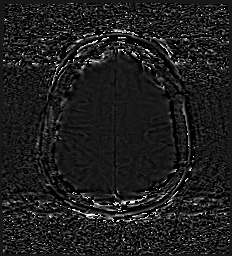
[im 54/54]
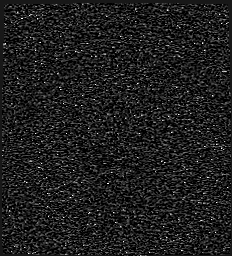

[Series 13: swi_images · axial · 3.0mm · 0.90mm/px · z∈[-142,+34]mm · 5 of 60 slices shown]
[im 1/60]
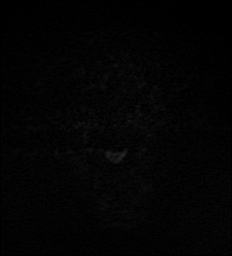
[im 15/60]
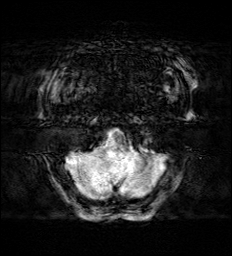
[im 30/60]
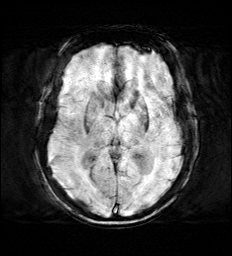
[im 45/60]
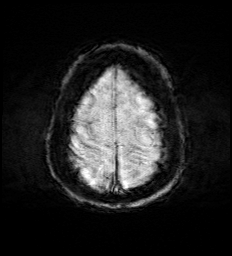
[im 60/60]
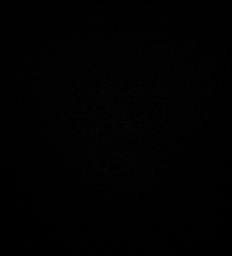

[Series 15: FLAIR · axial · 3.0mm · 0.53mm/px · z∈[-135,+27]mm · 4 of 55 slices shown]
[im 1/55]
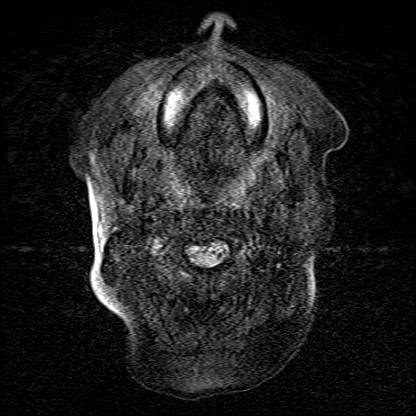
[im 19/55]
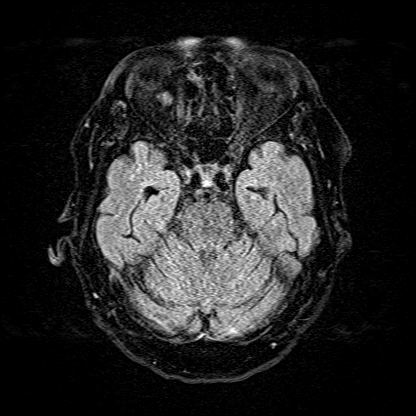
[im 37/55]
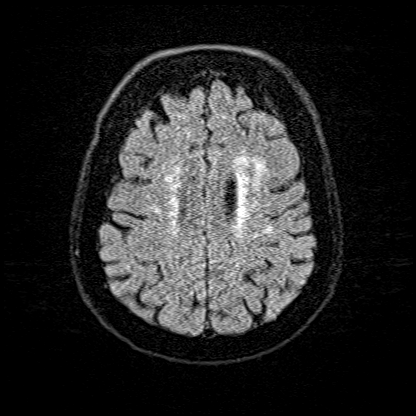
[im 55/55]
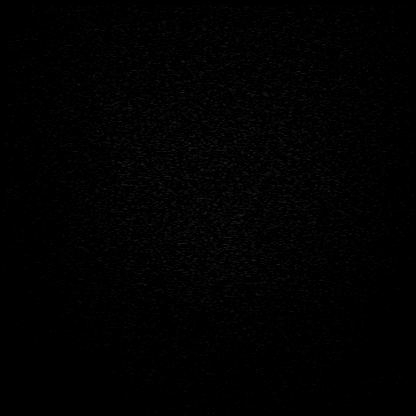

[Series 16: T1 · axial · 1.0mm · 0.98mm/px · z∈[-142,+32]mm · 8 of 176 slices shown (2 of 2)]
[im 1/176]
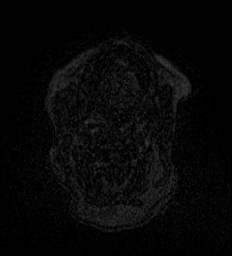
[im 27/176]
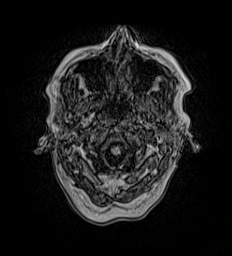
[im 54/176]
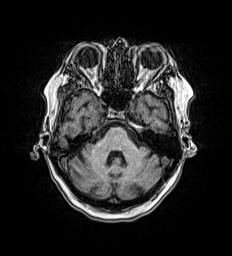
[im 81/176]
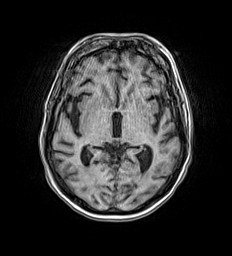
[im 95/176]
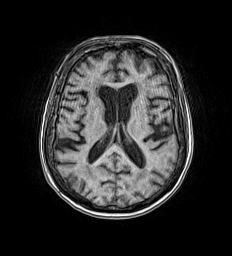
[im 122/176]
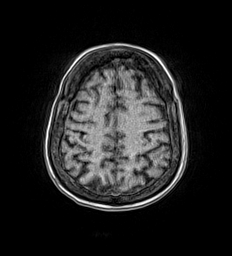
[im 149/176]
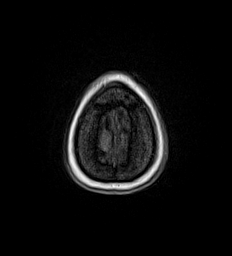
[im 176/176]
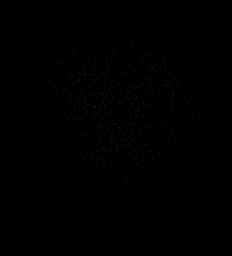

[Series 17: T2 · coronal · 5.0mm · 0.57mm/px · 2 of 29 slices shown (2 of 2)]
[im 1/29]
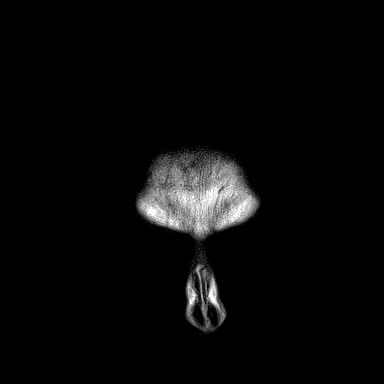
[im 29/29]
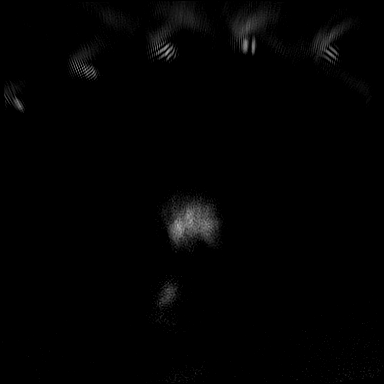

[42 of 48 positions shown; findings below may reference images not displayed]

FINDINGS: Brain:

Mild intermittent motion degradation.

Mild generalized cerebral atrophy.

Severe scattered and confluent T2/FLAIR hyperintensity within the
cerebral white matter is nonspecific.

There is no acute infarct.

No evidence of intracranial mass.

No chronic intracranial blood products.

No extra-axial fluid collection.

No midline shift.

Vascular: Expected proximal arterial flow voids.

Skull and upper cervical spine: No focal marrow lesion

Sinuses/Orbits: Visualized orbits show no acute finding. No
significant paranasal sinus disease at the imaged levels.
IMPRESSION: Mildly motion degraded examination.

No evidence of acute intracranial abnormality, including acute
infarction.

Severe cerebral white matter disease which is nonspecific, but most
often secondary to chronic small vessel ischemia.

Mild cerebral atrophy.

## 2020-04-06 IMAGING — MR MR LUMBAR SPINE W/O CM
5 series · 30 of 48 positions shown · non-contrast
Comparison: Radiographs of the lumbar spine [DATE].

CLINICAL DATA: Low back pain, greater than 6 weeks; leg weakness,
bilateral.

EXAM:
MRI LUMBAR SPINE WITHOUT CONTRAST
TECHNIQUE: Multiplanar, multisequence MR imaging of the lumbar spine was
performed. No intravenous contrast was administered.

[Series 30: T2 · sagittal · 4.0mm · 0.81mm/px · 6 of 17 slices shown (1 of 2)]
[im 1/17]
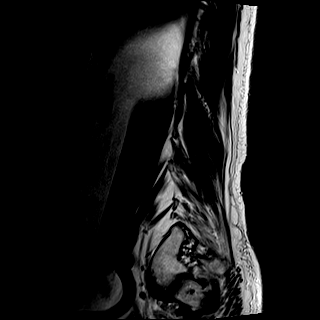
[im 4/17]
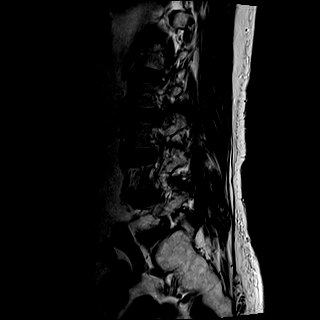
[im 7/17]
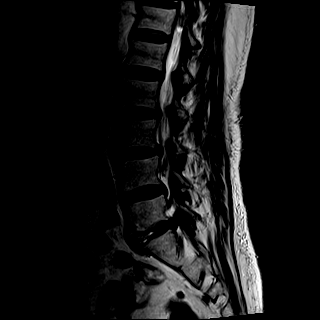
[im 10/17]
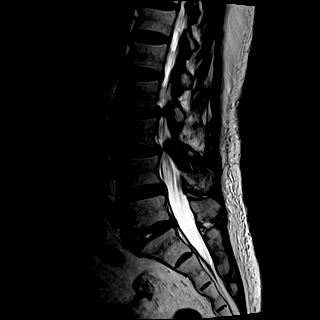
[im 13/17]
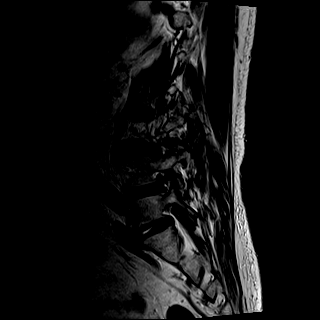
[im 17/17]
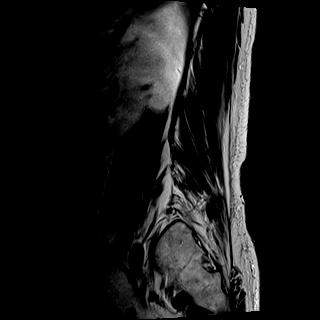

[Series 31: T1 · sagittal · 4.0mm · 0.81mm/px · 7 of 17 slices shown (1 of 2)]
[im 1/17]
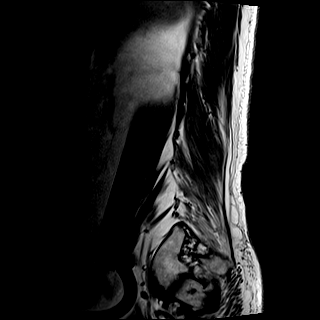
[im 3/17]
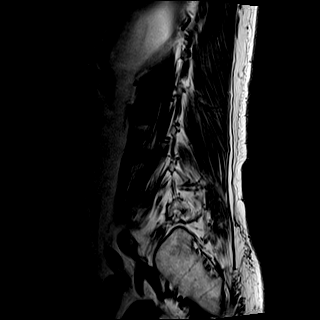
[im 6/17]
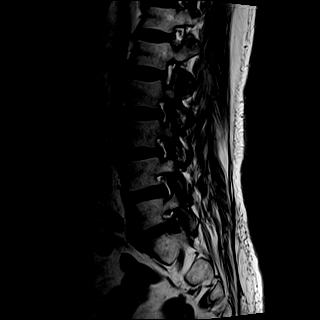
[im 9/17]
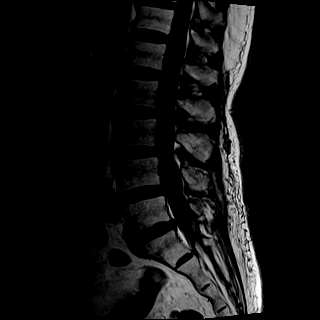
[im 11/17]
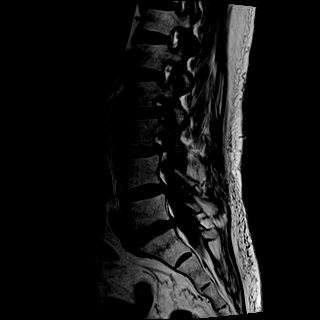
[im 14/17]
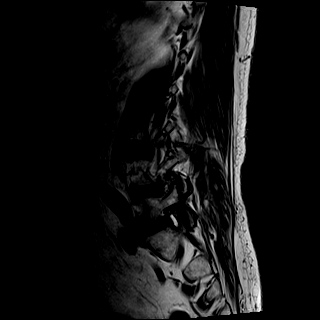
[im 17/17]
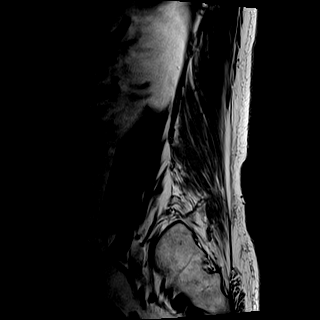

[Series 32: STIR · sagittal · 4.0mm · 0.41mm/px · 1 of 17 slices shown]
[im 1/17]
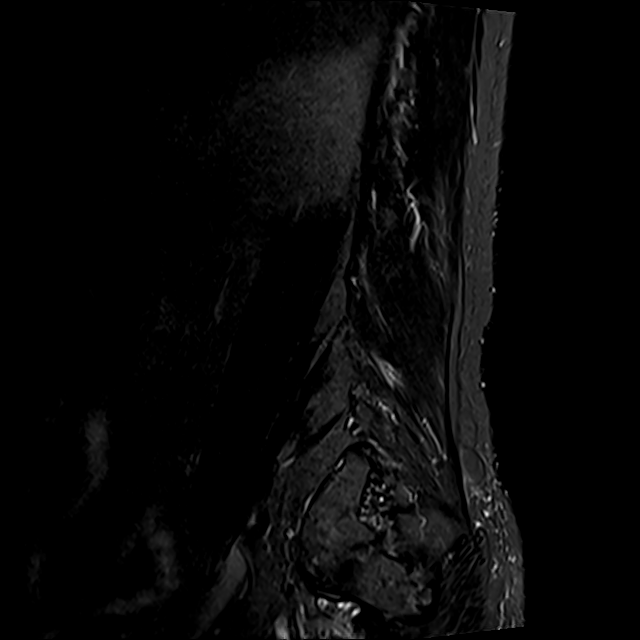

[Series 33: T2 · axial · 4.0mm · 0.78mm/px · z∈[-656,-447]mm · 8 of 36 slices shown (2 of 2)]
[im 1/36]
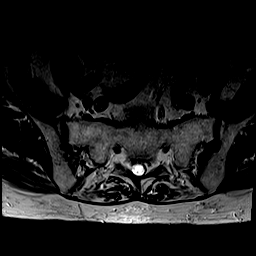
[im 6/36]
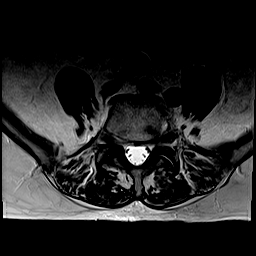
[im 11/36]
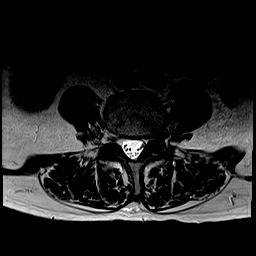
[im 17/36]
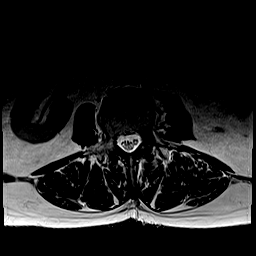
[im 19/36]
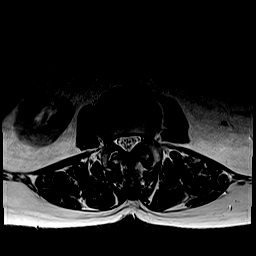
[im 25/36]
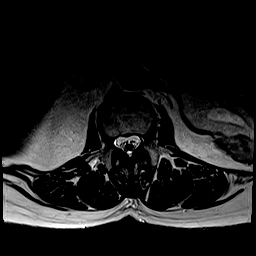
[im 30/36]
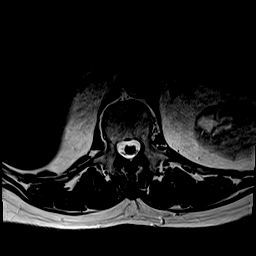
[im 36/36]
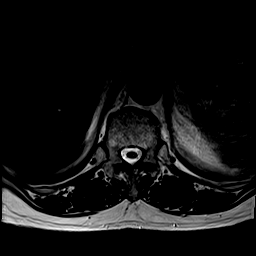

[Series 34: T1 · axial · 4.0mm · 0.39mm/px · z∈[-656,-447]mm · 8 of 36 slices shown (2 of 2)]
[im 1/36]
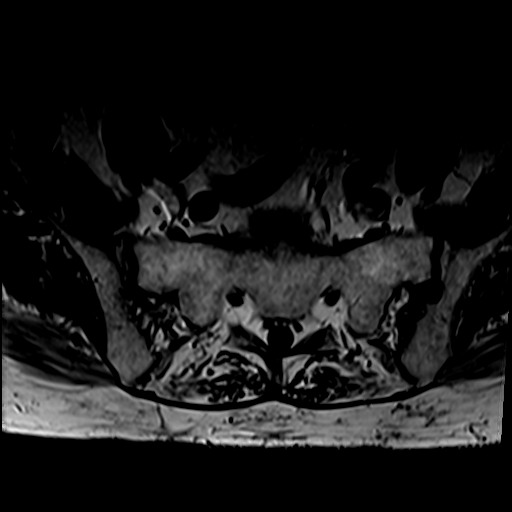
[im 6/36]
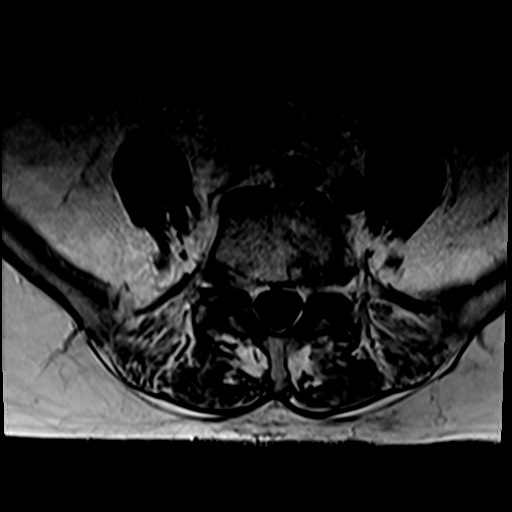
[im 11/36]
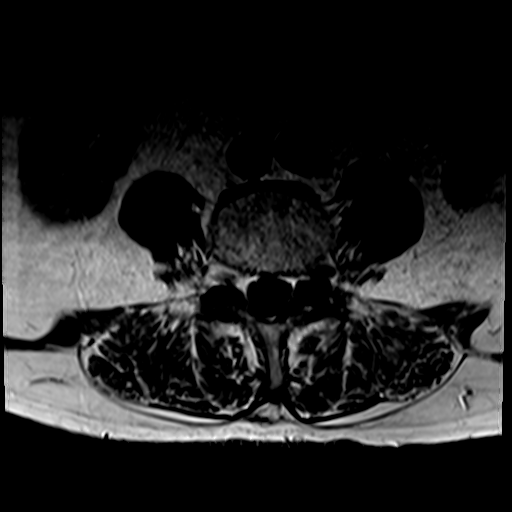
[im 17/36]
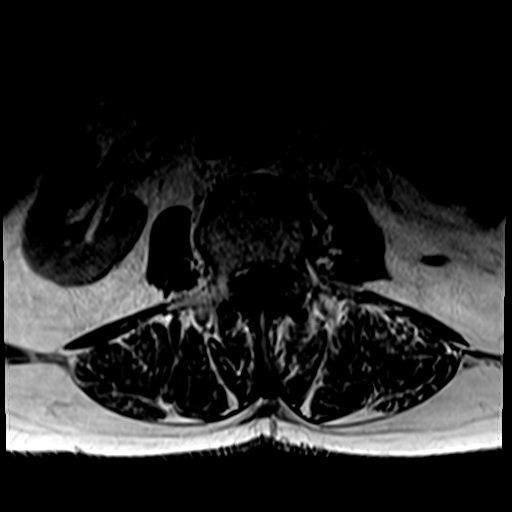
[im 19/36]
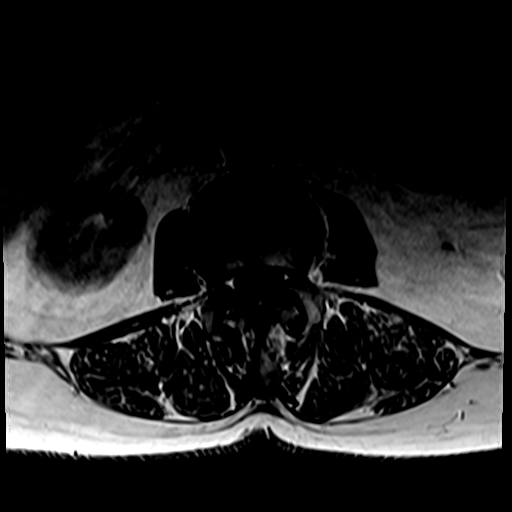
[im 25/36]
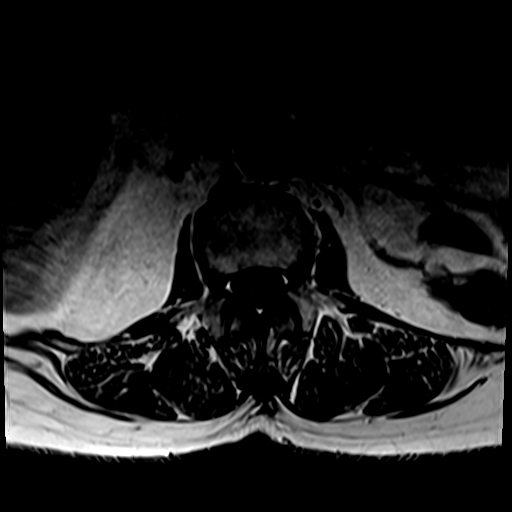
[im 30/36]
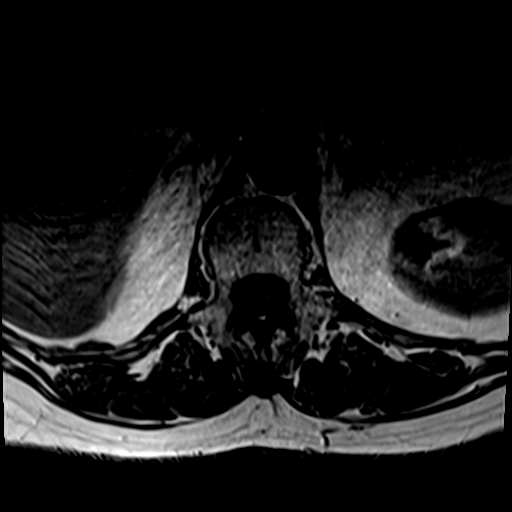
[im 36/36]
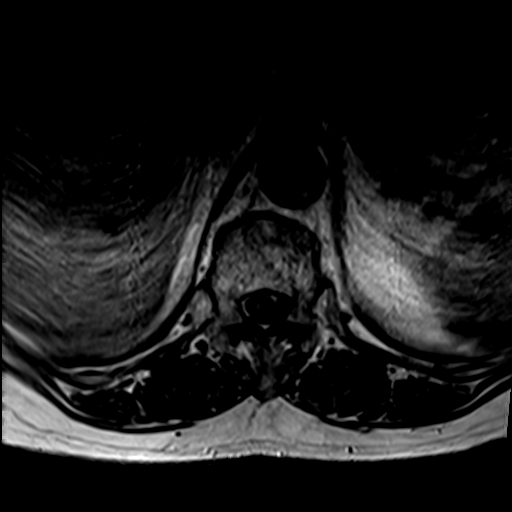

[30 of 48 positions shown; findings below may reference images not displayed]

FINDINGS: Segmentation: 5 lumbar vertebrae (correlating with prior lumbar
spine radiographs [DATE]).

Alignment:  Trace grade 1 anterolisthesis at L2-L3, L3-L4 and L4-L5.

Vertebrae: Moderate degenerative endplate edema at L5-S1 greatest
anteriorly.

Conus medullaris and cauda equina: Conus extends to the L2-L3 disc
space level. No signal abnormality within the visualized distal
spinal cord.

Paraspinal and other soft tissues: No abnormality identified within
included portions of the abdomen/retroperitoneum. Atrophy of the
lumbar paraspinal musculature.

Disc levels:

Mild disc degeneration throughout the lumbar spine. Additionally,
there is prominent T2/FLAIR hyperintense signal abnormality within
the central and anterior L5-S1 disc favored to reflect a large
degenerative fissure.

T12-L1: Mild facet arthrosis. No significant disc herniation or
stenosis.

L1-L2: Disc bulge. Superimposed tiny central disc protrusion. Mild
facet arthrosis/ligamentum flavum hypertrophy. Mild relative spinal
canal narrowing without nerve root impingement. No significant
foraminal stenosis.

L2-L3: Trace grade 1 anterolisthesis. Disc bulge. Moderate facet
arthrosis with ligamentum flavum hypertrophy. Moderate central canal
stenosis. Left greater than right subarticular narrowing with
potential to affect the descending left L3 nerve root. Bilateral
neural foraminal narrowing (mild right, moderate left).

L3-L4: Trace grade 1 retrolisthesis. Disc bulge. Advanced facet
arthrosis with ligamentum flavum hypertrophy. Left greater than
right subarticular narrowing with crowding of the descending left L4
nerve root. Moderate central canal stenosis. Mild bilateral neural
foraminal narrowing (greater on the right).

L4-L5: Trace grade 1 anterolisthesis. Disc bulge. Moderate facet
arthrosis with ligamentum flavum hypertrophy. Left greater than
right subarticular narrowing with slight crowding of the descending
left L5 nerve root. Central canal patent. Moderate bilateral neural
foraminal narrowing.

L5-S1: Disc bulge with endplate spurring. Superimposed broad-based
right foraminal disc protrusion. Mild facet arthrosis. No
significant spinal canal stenosis. Bilateral neural foraminal
narrowing (severe right, moderate/severe left)
IMPRESSION: At L5-S1, there is somewhat prominent T2/STIR hyperintense signal
abnormality within the central and anterior aspect of the disc.
Moderate endplate edema is also present at this level. Findings
likely reflect a large fissure within the disc and degenerative
endplate edema, respectively. However, correlate clinically to
exclude any signs or symptoms of infection that would suggest an
early discitis/osteomyelitis. Additionally, correlate with relevant
laboratory values.

Lumbar spondylosis as described with findings most notably as
follows.

At L2-L3, multifactorial left greater than right subarticular
narrowing with potential to affect the descending left L3 nerve
root. Moderate central canal stenosis. Bilateral neural foraminal
narrowing (mild right, moderate left).

At L3-L4, left greater than right subarticular narrowing with
crowding of the descending left L4 nerve root. Moderate central
canal stenosis. Mild bilateral neural foraminal narrowing.

At L4-L5, left greater than right subarticular narrowing with slight
crowding of the descending left L5 nerve root. Moderate bilateral
neural foraminal narrowing.

At L5-S1, multifactorial bilateral neural foraminal narrowing
(severe right, moderate/severe left).

## 2020-04-06 NOTE — ED Provider Notes (Addendum)
[facility] Emergency Department Provider Note   ____________________________________________   First MD Initiated Contact with Patient 04/06/20 1533     (approximate)  I have reviewed the triage vital signs and the nursing notes.   HISTORY  Chief Complaint Weakness    HPI [name] is a 74 y.o. female patient reports that since February she has been getting progressively weaker and falling.  In February she had an episode she describes as complete mental and physical breakdown where she had trouble speaking clearly and walking.  This improved but now has been again worsening slowly.  For the last 2 weeks it is much weaker.  She is having difficulty walking.  The emergency room she had to have support from 2 people to make it from the stretcher to the toilet.  She had difficulty turning around and was falling to the right.  She reports her low back hurts but is not tender to palpation or percussion.         Past Medical History:  Diagnosis Date  . Arthritis   . Breast cancer ([place])   . Cancer [facility]) 2004   right breast ca  . Diabetes mellitus without complication ([place])   . Hypercholesterolemia   . Hypertension   . Hypothyroid   . Seasonal allergies     Patient Active Problem List   Diagnosis Date Noted  . Bipolar disorder current episode depressed ([place]) 01/07/2020  . MDD (major depressive disorder) 08/10/2019  . MDD (major depressive disorder), recurrent episode, severe ([place]) 06/24/2019  . Pain due to onychomycosis of toenails of both feet 11/23/2018  . Bipolar 1 disorder, manic, moderate ([place]) 07/21/2018  . Bipolar 1 disorder, mixed, severe ([place]) 07/21/2018  . Bipolar 1 disorder with moderate mania ([place]) 07/20/2018  . Severe bipolar I disorder, current or most recent episode depressed ([place]) 07/07/2017  . PTSD (post-traumatic stress disorder) 07/07/2017  . Adjustment disorder with mixed disturbance of emotions and conduct  10/11/2016  . Diabetes ([place]) 09/23/2014  . Essential hypertension 09/21/2014  . Hypothyroidism 09/21/2014  . Dyslipidemia 09/21/2014  . Bipolar I disorder, current or most recent episode manic, severe with mixed features ([place]) 09/20/2014    Past Surgical History:  Procedure Laterality Date  . ABDOMINAL HYSTERECTOMY    . BREAST BIOPSY Left    neg  . BREAST EXCISIONAL BIOPSY Right 2004   breast ca and lumpectomy  . BREAST LUMPECTOMY Right 2004   breast ca  . CHOLECYSTECTOMY    . COLONOSCOPY WITH PROPOFOL N/A 02/13/2017   Procedure: COLONOSCOPY WITH PROPOFOL;  Surgeon: Jonathon Bellows, MD;  Location: [facility] ENDOSCOPY;  Service: Gastroenterology;  Laterality: N/A;  . KIDNEY STONE SURGERY      Prior to Admission medications   Medication Sig Start Date End Date Taking? Authorizing Provider  ACCU-CHEK AVIVA PLUS test strip  07/26/19   [provider]  Accu-Chek Softclix Lancets lancets  07/26/19   [provider]  clonazePAM (KLONOPIN) 1 MG tablet Take 1 tablet (1 mg total) by mouth 2 (two) times daily. 01/11/20   Clapacs, Madie [place], MD  divalproex (DEPAKOTE ER) 500 MG 24 hr tablet Take 2 tablets (1,000 mg total) by mouth at bedtime. 01/11/20   Clapacs, Madie [place], MD  enalapril (VASOTEC) 5 MG tablet Take 1 tablet (5 mg total) by mouth 2 (two) times daily. 01/11/20   Clapacs, Madie Reno, MD  FLUoxetine (PROZAC) 20 MG capsule Take 3 capsules (60 mg total) by mouth daily. 01/11/20 02/10/20  Clapacs, Madie Reno, MD  glipiZIDE (GLUCOTROL XL) 10 MG 24 hr tablet Take 1 tablet (10 mg total) by mouth daily with breakfast. 01/12/20   Clapacs, Madie Reno, MD  glipiZIDE (GLUCOTROL XL) 5 MG 24 hr tablet Take 1 tablet (5 mg total) by mouth at bedtime. 01/11/20   Clapacs, Madie Reno, MD  levothyroxine (SYNTHROID) 88 MCG tablet Take 1 tablet (88 mcg total) by mouth daily. 03/01/20 03/31/20  Duffy Bruce, MD  linagliptin (TRADJENTA) 5 MG TABS tablet Take 1 tablet (5 mg total) by mouth 2 (two) times daily. 01/11/20   Clapacs,  Madie Reno, MD  loratadine (CLARITIN) 10 MG tablet Take 1 tablet (10 mg total) by mouth daily. 01/11/20   Clapacs, Madie Reno, MD  lurasidone (LATUDA) 20 MG TABS tablet Take 1 tablet (20 mg total) by mouth daily with breakfast. 01/12/20   Clapacs, Madie Reno, MD  rosuvastatin (CRESTOR) 40 MG tablet Take 1 tablet (40 mg total) by mouth daily. 01/11/20   Clapacs, Madie Reno, MD    Allergies Navane [thiothixene] and Penicillins  Family History  Problem Relation Age of Onset  . Bladder Cancer Neg Hx   . Kidney cancer Neg Hx     Social History Social History   Tobacco Use  . Smoking status: Former Smoker    Types: Cigarettes  . Smokeless tobacco: Never Used  Vaping Use  . Vaping Use: Never used  Substance Use Topics  . Alcohol use: No  . Drug use: No    Review of Systems  Constitutional: No fever/chills Eyes: No visual changes. ENT: No sore throat. Cardiovascular: Denies chest pain. Respiratory: Denies shortness of breath. Gastrointestinal: No abdominal pain.  No nausea, no vomiting.  No diarrhea.  No constipation. Genitourinary: Negative for dysuria. Musculoskeletalback pain. Skin: Negative for rash. Neurological: Negative for headaches, focal weakness except the legs as described above  ____________________________________________   PHYSICAL EXAM:  VITAL SIGNS: ED Triage Vitals  Enc Vitals Group     BP 04/06/20 1420 117/82     Pulse Rate 04/06/20 1420 72     Resp 04/06/20 1420 16     Temp 04/06/20 1420 98.7 F (37.1 C)     Temp Source 04/06/20 1420 Oral     SpO2 04/06/20 1420 95 %     Weight 04/06/20 1420 174 lb (78.9 kg)     Height 04/06/20 1420 5\' 2"  (1.575 m)     Head Circumference --      Peak Flow --      Pain Score 04/06/20 1424 0     Pain Loc --      Pain Edu? --      Excl. in [place]? --     Constitutional: Alert and oriented. Well appearing and in no acute distress. Eyes: Conjunctivae are normal. PER EOMI. Head: Atraumatic. Nose: No  congestion/rhinnorhea. Mouth/Throat: Mucous membranes are moist.  Oropharynx non-erythematous. Neck: No stridor.   Cardiovascular: Normal rate, regular rhythm. Grossly normal heart sounds.  Good peripheral circulation. Respiratory: Normal respiratory effort.  No retractions. Lungs CTAB. Gastrointestinal: Soft and nontender. No distention. No abdominal bruits. Musculoskeletal: No lower extremity tenderness nor edema.   Neurologic:  Normal speech and language. No gross focal neurologic deficits are appreciated.  Cranial nerves II through XII are intact although visual fields were not checked motor strength is 5/5 throughout patient does not have any weakness on motor testing in the legs when laying on the bed.  Patient does not report any numbness.  Finger-nose and  toe to hand are normal bilaterally Skin:  Skin is warm, dry and intact. No rash noted. Psychiatric: Mood and affect are normal. Speech and behavior are normal.  ____________________________________________   LABS (all labs ordered are listed, but only abnormal results are displayed)  Labs Reviewed  BASIC METABOLIC PANEL - Abnormal; Notable for the following components:      Result Value   Sodium 134 (*)    Glucose, Bld 357 (*)    BUN 28 (*)    Creatinine, Ser 1.34 (*)    GFR, Estimated 42 (*)    All other components within normal limits  URINALYSIS, COMPLETE (UACMP) WITH MICROSCOPIC - Abnormal; Notable for the following components:   Color, Urine YELLOW (*)    APPearance CLEAR (*)    Glucose, UA >=500 (*)    Hgb urine dipstick SMALL (*)    Ketones, ur 5 (*)    Protein, ur 30 (*)    All other components within normal limits  HEPATIC FUNCTION PANEL - Abnormal; Notable for the following components:   AST 13 (*)    All other components within normal limits  CBC  VALPROIC ACID LEVEL  SEDIMENTATION RATE  CBG MONITORING, ED  TROPONIN I (HIGH SENSITIVITY)  TROPONIN I (HIGH SENSITIVITY)    ____________________________________________  EKG  EKG read interpreted by me shows normal sinus rhythm rate of 70 normal axis no acute ST-T wave changes decreased R wave progression. ____________________________________________  RADIOLOGY Gertha Calkin, personally viewed and evaluated these images (plain radiographs) as part of my medical decision making, as well as reviewing the written report by the radiologist.  ED MD interpretation: CT of the head read by radiology is negative for acute changes.  MRI of the brain same MRI of the lumbar spine shows some moderate spinal stenosis at several levels along with some foraminal narrowing.  Official radiology report(s): CT Head Wo Contrast  Result Date: 04/06/2020 CLINICAL DATA:  74 year old female with neurologic deficit. EXAM: CT HEAD WITHOUT CONTRAST TECHNIQUE: Contiguous axial images were obtained from the base of the skull through the vertex without intravenous contrast. COMPARISON:  Head CT dated 09/01/2019. FINDINGS: Brain: Mild age-related atrophy and chronic microvascular ischemic changes. There is no acute intracranial hemorrhage. No mass effect midline shift. No extra-axial fluid collection. Vascular: No hyperdense vessel or unexpected calcification. Skull: Normal. Negative for fracture or focal lesion. Sinuses/Orbits: No acute finding. Other: None IMPRESSION: 1. No acute intracranial pathology. 2. Mild age-related atrophy and chronic microvascular ischemic changes. Electronically Signed   By: Anner Crete M.D.   On: 04/06/2020 16:55   MR BRAIN WO CONTRAST  Result Date: 04/06/2020 CLINICAL DATA:  Neuro deficit, acute, stroke suspected; subacute neuro deficits/leg weakness, falling to the right. EXAM: MRI HEAD WITHOUT CONTRAST TECHNIQUE: Multiplanar, multiecho pulse sequences of the brain and surrounding structures were obtained without intravenous contrast. COMPARISON:  Prior head CT examinations 04/06/2020 and earlier.  FINDINGS: Brain: Mild intermittent motion degradation. Mild generalized cerebral atrophy. Severe scattered and confluent T2/FLAIR hyperintensity within the cerebral white matter is nonspecific. There is no acute infarct. No evidence of intracranial mass. No chronic intracranial blood products. No extra-axial fluid collection. No midline shift. Vascular: Expected proximal arterial flow voids. Skull and upper cervical spine: No focal marrow lesion Sinuses/Orbits: Visualized orbits show no acute finding. No significant paranasal sinus disease at the imaged levels. IMPRESSION: Mildly motion degraded examination. No evidence of acute intracranial abnormality, including acute infarction. Severe cerebral white matter disease which is nonspecific, but  most often secondary to chronic small vessel ischemia. Mild cerebral atrophy. Electronically Signed   By: Kellie Simmering DO   On: 04/06/2020 20:37   MR LUMBAR SPINE WO CONTRAST  Result Date: 04/06/2020 CLINICAL DATA:  Low back pain, greater than 6 weeks; leg weakness, bilateral. EXAM: MRI LUMBAR SPINE WITHOUT CONTRAST TECHNIQUE: Multiplanar, multisequence MR imaging of the lumbar spine was performed. No intravenous contrast was administered. COMPARISON:  Radiographs of the lumbar spine 07/05/2019. FINDINGS: Segmentation: 5 lumbar vertebrae (correlating with prior lumbar spine radiographs 07/05/2019). Alignment:  Trace grade 1 anterolisthesis at L2-L3, L3-L4 and L4-L5. Vertebrae: Moderate degenerative endplate edema at W0-J8 greatest anteriorly. Conus medullaris and cauda equina: Conus extends to the L2-L3 disc space level. No signal abnormality within the visualized distal spinal cord. Paraspinal and other soft tissues: No abnormality identified within included portions of the abdomen/retroperitoneum. Atrophy of the lumbar paraspinal musculature. Disc levels: Mild disc degeneration throughout the lumbar spine. Additionally, there is prominent T2/FLAIR hyperintense signal  abnormality within the central and anterior L5-S1 disc favored to reflect a large degenerative fissure. T12-L1: Mild facet arthrosis. No significant disc herniation or stenosis. L1-L2: Disc bulge. Superimposed tiny central disc protrusion. Mild facet arthrosis/ligamentum flavum hypertrophy. Mild relative spinal canal narrowing without nerve root impingement. No significant foraminal stenosis. L2-L3: Trace grade 1 anterolisthesis. Disc bulge. Moderate facet arthrosis with ligamentum flavum hypertrophy. Moderate central canal stenosis. Left greater than right subarticular narrowing with potential to affect the descending left L3 nerve root. Bilateral neural foraminal narrowing (mild right, moderate left). L3-L4: Trace grade 1 retrolisthesis. Disc bulge. Advanced facet arthrosis with ligamentum flavum hypertrophy. Left greater than right subarticular narrowing with crowding of the descending left L4 nerve root. Moderate central canal stenosis. Mild bilateral neural foraminal narrowing (greater on the right). L4-L5: Trace grade 1 anterolisthesis. Disc bulge. Moderate facet arthrosis with ligamentum flavum hypertrophy. Left greater than right subarticular narrowing with slight crowding of the descending left L5 nerve root. Central canal patent. Moderate bilateral neural foraminal narrowing. L5-S1: Disc bulge with endplate spurring. Superimposed broad-based right foraminal disc protrusion. Mild facet arthrosis. No significant spinal canal stenosis. Bilateral neural foraminal narrowing (severe right, moderate/severe left) IMPRESSION: At L5-S1, there is somewhat prominent T2/STIR hyperintense signal abnormality within the central and anterior aspect of the disc. Moderate endplate edema is also present at this level. Findings likely reflect a large fissure within the disc and degenerative endplate edema, respectively. However, correlate clinically to exclude any signs or symptoms of infection that would suggest an early  discitis/osteomyelitis. Additionally, correlate with relevant laboratory values. Lumbar spondylosis as described with findings most notably as follows. At L2-L3, multifactorial left greater than right subarticular narrowing with potential to affect the descending left L3 nerve root. Moderate central canal stenosis. Bilateral neural foraminal narrowing (mild right, moderate left). At L3-L4, left greater than right subarticular narrowing with crowding of the descending left L4 nerve root. Moderate central canal stenosis. Mild bilateral neural foraminal narrowing. At L4-L5, left greater than right subarticular narrowing with slight crowding of the descending left L5 nerve root. Moderate bilateral neural foraminal narrowing. At L5-S1, multifactorial bilateral neural foraminal narrowing (severe right, moderate/severe left). Electronically Signed   By: Kellie Simmering DO   On: 04/06/2020 20:21    ____________________________________________   PROCEDURES  Procedure(s) performed (including Critical Care):  Procedures   ____________________________________________   INITIAL IMPRESSION / ASSESSMENT AND PLAN / ED COURSE Discussed the patient and the MRI with Dr. Cari Caraway neurosurgery.  He will follow up with her outpatient.  She is otherwise feeling well.  She wants to go home.  I will let her go home she will return for any further problems fever pain etc. her white count is normal her sed rate is normal it is unlikely that the brightness on the MRI in the 1 level is infection probably it is a disc as suspected by radiology.  Additionally she is afebrile.  Her symptoms have been very slowly progressive and are likely related to her moderate spinal stenosis as the CT and MRI of her head are normal and she has no pain elsewhere.  I believe it is safe to let her go home that she wants to do she has a walker to get around with at home and she has follow-up both with her regular doctor and now neurosurgery.  She  knows she can come back at any time if she needs to.               ____________________________________________   FINAL CLINICAL IMPRESSION(S) / ED DIAGNOSES  Final diagnoses:  Weakness  Chronic midline low back pain, unspecified whether sciatica present  Spinal stenosis of lumbosacral region     ED Discharge Orders    None      *Please note:  Dwana Garin was evaluated in Emergency Department on 04/06/2020 for the symptoms described in the history of present illness. She was evaluated in the context of the global COVID-19 pandemic, which necessitated consideration that the patient might be at risk for infection with the SARS-CoV-2 virus that causes COVID-19. Institutional protocols and algorithms that pertain to the evaluation of patients at risk for COVID-19 are in a state of rapid change based on information released by regulatory bodies including the CDC and federal and state organizations. These policies and algorithms were followed during the patient's care in the ED.  Some ED evaluations and interventions may be delayed as a result of limited staffing during and the pandemic.*   Note:  This document was prepared using Dragon voice recognition software and may include unintentional dictation errors.    Nena Polio, MD 04/06/20 2106    Nena Polio, MD 04/06/20 2115

## 2020-04-06 NOTE — ED Notes (Signed)
Pt transported to MRI 

## 2020-04-06 NOTE — Discharge Instructions (Addendum)
Please follow-up with Dr. Cari Caraway the neurosurgeon.  He should call you to arrange a follow-up appointment.  If he does not call you by Tuesday he can call his office.  Please return for any worse back pain or weakness or incontinence.  Use Tylenol if need be for back pain.  10 you to use your walker and follow with your primary care doctor as well.

## 2020-04-06 NOTE — ED Triage Notes (Addendum)
PT to ED via EMS from home. PT states she just cannot stand up. Says she tries to stand and just falls down. PT keenly alert and oriented. States her back hurts and her legs get weak. Denies dizziness. Sounds like this is an ongoing issue that has recently worsened to point pt cannot stand at all.

## 2020-04-06 NOTE — ED Notes (Signed)
Pt here via EMS for 2 months bilateral leg weakness.

## 2020-04-10 ENCOUNTER — Other Ambulatory Visit: Payer: Self-pay

## 2020-04-10 ENCOUNTER — Emergency Department
Admission: EM | Admit: 2020-04-10 | Discharge: 2020-04-12 | Disposition: A | Discharge status: Home / Self Care | Payer: Medicare HMO | Attending: Emergency Medicine | Admitting: Emergency Medicine

## 2020-04-10 DIAGNOSIS — R296 Repeated falls: Secondary | ICD-10-CM | POA: Diagnosis not present

## 2020-04-10 DIAGNOSIS — Z87891 Personal history of nicotine dependence: Secondary | ICD-10-CM | POA: Insufficient documentation

## 2020-04-10 DIAGNOSIS — Z853 Personal history of malignant neoplasm of breast: Secondary | ICD-10-CM | POA: Diagnosis not present

## 2020-04-10 DIAGNOSIS — W19XXXA Unspecified fall, initial encounter: Secondary | ICD-10-CM

## 2020-04-10 DIAGNOSIS — E119 Type 2 diabetes mellitus without complications: Secondary | ICD-10-CM | POA: Diagnosis not present

## 2020-04-10 DIAGNOSIS — Z7984 Long term (current) use of oral hypoglycemic drugs: Secondary | ICD-10-CM | POA: Diagnosis not present

## 2020-04-10 DIAGNOSIS — Z79899 Other long term (current) drug therapy: Secondary | ICD-10-CM | POA: Insufficient documentation

## 2020-04-10 DIAGNOSIS — I10 Essential (primary) hypertension: Secondary | ICD-10-CM | POA: Diagnosis not present

## 2020-04-10 DIAGNOSIS — Z20822 Contact with and (suspected) exposure to covid-19: Secondary | ICD-10-CM | POA: Insufficient documentation

## 2020-04-10 DIAGNOSIS — E039 Hypothyroidism, unspecified: Secondary | ICD-10-CM | POA: Insufficient documentation

## 2020-04-10 LAB — CBC
HCT: 42.5 % (ref 36.0–46.0)
Hemoglobin: 14.3 g/dL (ref 12.0–15.0)
MCH: 29.7 pg (ref 26.0–34.0)
MCHC: 33.6 g/dL (ref 30.0–36.0)
MCV: 88.2 fL (ref 80.0–100.0)
Platelets: 180 10*3/uL (ref 150–400)
RBC: 4.82 MIL/uL (ref 3.87–5.11)
RDW: 13 % (ref 11.5–15.5)
WBC: 9.9 10*3/uL (ref 4.0–10.5)
nRBC: 0 % (ref 0.0–0.2)

## 2020-04-10 LAB — BASIC METABOLIC PANEL
Anion gap: 11 (ref 5–15)
BUN: 34 mg/dL — ABNORMAL HIGH (ref 8–23)
CO2: 24 mmol/L (ref 22–32)
Calcium: 9.6 mg/dL (ref 8.9–10.3)
Chloride: 98 mmol/L (ref 98–111)
Creatinine, Ser: 1.5 mg/dL — ABNORMAL HIGH (ref 0.44–1.00)
GFR, Estimated: 36 mL/min — ABNORMAL LOW (ref >60)
Glucose, Bld: 319 mg/dL — ABNORMAL HIGH (ref 70–99)
Potassium: 4.2 mmol/L (ref 3.5–5.1)
Sodium: 133 mmol/L — ABNORMAL LOW (ref 135–145)

## 2020-04-10 LAB — CBG MONITORING, ED
Glucose-Capillary: 372 mg/dL — ABNORMAL HIGH (ref 70–99)
Glucose-Capillary: 358 mg/dL — ABNORMAL HIGH (ref 70–99)
Glucose-Capillary: 356 mg/dL — ABNORMAL HIGH (ref 70–99)

## 2020-04-10 LAB — VALPROIC ACID LEVEL: Valproic Acid Lvl: 87 ug/mL (ref 50.0–100.0)

## 2020-04-10 LAB — TSH: TSH: 0.676 u[IU]/mL (ref 0.350–4.500)

## 2020-04-10 LAB — RESP PANEL BY RT-PCR (FLU A&B, COVID) ARPGX2
Influenza A by PCR: NEGATIVE
Influenza B by PCR: NEGATIVE
SARS Coronavirus 2 by RT PCR: NEGATIVE

## 2020-04-10 LAB — CK: Total CK: 135 U/L (ref 38–234)

## 2020-04-10 MED ORDER — LINAGLIPTIN 5 MG PO TABS
5.0000 mg | ORAL_TABLET | Freq: Two times a day (BID) | ORAL | Status: DC
  Administered 2020-04-10 – 2020-04-12 (×4): 5 mg via ORAL
  Filled 2020-04-10 (×5): qty 1

## 2020-04-10 MED ORDER — SODIUM CHLORIDE 0.9 % IV BOLUS
500.0000 mL | Freq: Once | INTRAVENOUS | Status: AC
  Administered 2020-04-10: 500 mL via INTRAVENOUS

## 2020-04-10 MED ORDER — CLONAZEPAM 0.5 MG PO TABS
1.0000 mg | ORAL_TABLET | Freq: Two times a day (BID) | ORAL | Status: DC
  Administered 2020-04-10 – 2020-04-12 (×5): 1 mg via ORAL
  Filled 2020-04-10 (×5): qty 2

## 2020-04-10 MED ORDER — ENALAPRIL MALEATE 10 MG PO TABS
5.0000 mg | ORAL_TABLET | Freq: Two times a day (BID) | ORAL | Status: DC
  Administered 2020-04-10 – 2020-04-11 (×4): 5 mg via ORAL
  Filled 2020-04-10 (×5): qty 1

## 2020-04-10 MED ORDER — GLIPIZIDE ER 10 MG PO TB24
10.0000 mg | ORAL_TABLET | Freq: Every day | ORAL | Status: DC
  Administered 2020-04-10 – 2020-04-12 (×3): 10 mg via ORAL
  Filled 2020-04-10 (×4): qty 1

## 2020-04-10 MED ORDER — INSULIN ASPART 100 UNIT/ML ~~LOC~~ SOLN
5.0000 [IU] | Freq: Once | SUBCUTANEOUS | Status: AC
  Administered 2020-04-10: 5 [IU] via SUBCUTANEOUS
  Filled 2020-04-10: qty 1

## 2020-04-10 MED ORDER — LURASIDONE HCL 40 MG PO TABS
20.0000 mg | ORAL_TABLET | Freq: Every day | ORAL | Status: DC
  Administered 2020-04-11 – 2020-04-12 (×2): 20 mg via ORAL
  Filled 2020-04-10 (×2): qty 1

## 2020-04-10 MED ORDER — LORATADINE 10 MG PO TABS
10.0000 mg | ORAL_TABLET | Freq: Every day | ORAL | Status: DC
  Administered 2020-04-10 – 2020-04-12 (×3): 10 mg via ORAL
  Filled 2020-04-10 (×3): qty 1

## 2020-04-10 MED ORDER — LINAGLIPTIN 5 MG PO TABS: 5.0000 mg | ORAL_TABLET | Freq: Two times a day (BID) | ORAL | Status: DC

## 2020-04-10 MED ORDER — FLUOXETINE HCL 20 MG PO CAPS
60.0000 mg | ORAL_CAPSULE | Freq: Every day | ORAL | Status: DC
  Administered 2020-04-10 – 2020-04-12 (×3): 60 mg via ORAL
  Filled 2020-04-10 (×3): qty 3

## 2020-04-10 MED ORDER — LEVOTHYROXINE SODIUM 88 MCG PO TABS: 88.0000 ug | ORAL_TABLET | Freq: Every day | ORAL | Status: DC

## 2020-04-10 MED ORDER — DIVALPROEX SODIUM ER 250 MG PO TB24
1000.0000 mg | ORAL_TABLET | Freq: Every day | ORAL | Status: DC
  Administered 2020-04-10 – 2020-04-11 (×2): 1000 mg via ORAL
  Filled 2020-04-10 (×2): qty 4

## 2020-04-10 MED ORDER — INSULIN ASPART 100 UNIT/ML ~~LOC~~ SOLN
7.0000 [IU] | Freq: Once | SUBCUTANEOUS | Status: AC
  Administered 2020-04-10: 7 [IU] via SUBCUTANEOUS
  Filled 2020-04-10: qty 1

## 2020-04-10 MED ORDER — ROSUVASTATIN CALCIUM 20 MG PO TABS
40.0000 mg | ORAL_TABLET | Freq: Every day | ORAL | Status: DC
  Administered 2020-04-10 – 2020-04-12 (×3): 40 mg via ORAL
  Filled 2020-04-10 (×3): qty 2

## 2020-04-10 MED ORDER — LEVOTHYROXINE SODIUM 88 MCG PO TABS
88.0000 ug | ORAL_TABLET | Freq: Every day | ORAL | Status: DC
  Administered 2020-04-11 – 2020-04-12 (×2): 88 ug via ORAL
  Filled 2020-04-10 (×4): qty 1

## 2020-04-10 MED ORDER — GLIPIZIDE ER 5 MG PO TB24
5.0000 mg | ORAL_TABLET | Freq: Every day | ORAL | Status: DC
  Administered 2020-04-10 – 2020-04-11 (×2): 5 mg via ORAL
  Filled 2020-04-10 (×3): qty 1

## 2020-04-10 MED ORDER — ACETAMINOPHEN 325 MG PO TABS: 650.0000 mg | ORAL_TABLET | Freq: Four times a day (QID) | ORAL | Status: DC | PRN

## 2020-04-10 NOTE — ED Notes (Signed)
Sent MD. Jacqualine Code blood sugar reading

## 2020-04-10 NOTE — NC FL2 (Signed)
[place] LEVEL OF CARE SCREENING TOOL     IDENTIFICATION  Patient Name: [name] Birthdate: [birth year] Sex: female Admission Date (Current Location): 04/10/2020  [place] and [place] Number:  Engineering geologist and Address:  [facility], [address] [place], [place] [address]      Provider Number: [id number]  Attending Physician Name and Address:  Delman Kitten, MD  Relative Name and Phone Number:       Current Level of Care: Hospital Recommended Level of Care: [place] Prior Approval Number:    Date Approved/Denied:   PASRR Number:    Discharge Plan: SNF    Current Diagnoses: Patient Active Problem List   Diagnosis Date Noted  . Bipolar disorder current episode depressed ([place]) 01/07/2020  . MDD (major depressive disorder) 08/10/2019  . MDD (major depressive disorder), recurrent episode, severe ([place]) 06/24/2019  . Pain due to onychomycosis of toenails of both feet 11/23/2018  . Bipolar 1 disorder, manic, moderate ([place]) 07/21/2018  . Bipolar 1 disorder, mixed, severe ([place]) 07/21/2018  . Bipolar 1 disorder with moderate mania ([place]) 07/20/2018  . Severe bipolar I disorder, current or most recent episode depressed ([place]) 07/07/2017  . PTSD (post-traumatic stress disorder) 07/07/2017  . 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 Diabetes ([place]) 09/23/2014  . Essential hypertension 09/21/2014  . Hypothyroidism 09/21/2014  . Dyslipidemia 09/21/2014  . Bipolar I disorder, current or most recent episode manic, severe with mixed features ([place]) 09/20/2014    Orientation RESPIRATION BLADDER Height & Weight     Self, Time, Situation, Place  Normal Continent Weight: 78.9 kg Height:  5\' 2"  (157.5 cm)  BEHAVIORAL SYMPTOMS/MOOD NEUROLOGICAL BOWEL NUTRITION STATUS      Continent Diet (Regular)  AMBULATORY STATUS COMMUNICATION OF NEEDS Skin   Limited Assist Verbally  Normal                       Personal Care Assistance Level of Assistance  Dressing, Bathing Bathing Assistance: Limited assistance   Dressing Assistance: Limited assistance     Functional Limitations Info  Sight, Speech, Hearing Sight Info: Impaired (wears glasses) Hearing Info: Adequate Speech Info: Adequate    SPECIAL CARE FACTORS FREQUENCY  PT (By licensed PT), OT (By licensed OT)     PT Frequency: 5x a week OT Frequency: 5x a week            Contractures Contractures Info: Not present    Additional Factors Info  Code Status, Allergies Code Status Info: Full code Allergies Info: navane, penicillin           Current Medications (04/10/2020):  This is the current hospital active medication list Current Facility-Administered Medications  Medication Dose Route Frequency Provider Last Rate Last Admin  . acetaminophen (TYLENOL) tablet 650 mg  650 mg Oral Q6H PRN Delman Kitten, MD      . clonazePAM Bobbye [place]) tablet 1 mg  1 mg Oral BID Delman Kitten, MD   1 mg at 04/10/20 1058  . divalproex (DEPAKOTE ER) 24 hr tablet 1,000 mg  1,000 mg Oral QHS Delman Kitten, MD      . enalapril (VASOTEC) tablet 5 mg  5 mg Oral BID Delman Kitten, MD   5 mg at 04/10/20 1058  . FLUoxetine (PROZAC) capsule 60 mg  60 mg Oral Daily Delman Kitten, MD   60 mg at 04/10/20 1059  . glipiZIDE (GLUCOTROL XL) 24 hr tablet 10  mg  10 mg Oral Q breakfast Delman Kitten, MD      . glipiZIDE (GLUCOTROL XL) 24 hr tablet 5 mg  5 mg Oral QHS Delman Kitten, MD      . levothyroxine (SYNTHROID) tablet 88 mcg  88 mcg Oral Daily Delman Kitten, MD      . linagliptin (TRADJENTA) tablet 5 mg  5 mg Oral BID Delman Kitten, MD      . loratadine (CLARITIN) tablet 10 mg  10 mg Oral Daily Delman Kitten, MD   10 mg at 04/10/20 1059  . [START ON 04/11/2020] lurasidone (LATUDA) tablet 20 mg  20 mg Oral Q breakfast Delman Kitten, MD      . rosuvastatin (CRESTOR) tablet 40 mg  40 mg Oral Daily Delman Kitten, MD       Current Outpatient  Medications  Medication Sig Dispense Refill  . clonazePAM (KLONOPIN) 1 MG tablet Take 1 tablet (1 mg total) by mouth 2 (two) times daily. 60 tablet 1  . divalproex (DEPAKOTE ER) 500 MG 24 hr tablet Take 2 tablets (1,000 mg total) by mouth at bedtime. 60 tablet 1  . enalapril (VASOTEC) 5 MG tablet Take 1 tablet (5 mg total) by mouth 2 (two) times daily. 60 tablet 1  . FLUoxetine (PROZAC) 20 MG capsule Take 3 capsules (60 mg total) by mouth daily. 90 capsule 1  . glipiZIDE (GLUCOTROL XL) 10 MG 24 hr tablet Take 1 tablet (10 mg total) by mouth daily with breakfast. 30 tablet 1  . glipiZIDE (GLUCOTROL XL) 5 MG 24 hr tablet Take 1 tablet (5 mg total) by mouth at bedtime. 30 tablet 1  . levothyroxine (SYNTHROID) 88 MCG tablet Take 1 tablet (88 mcg total) by mouth daily. 30 tablet 1  . linagliptin (TRADJENTA) 5 MG TABS tablet Take 1 tablet (5 mg total) by mouth 2 (two) times daily. 60 tablet 1  . loratadine (CLARITIN) 10 MG tablet Take 1 tablet (10 mg total) by mouth daily. 30 tablet 1  . lurasidone (LATUDA) 20 MG TABS tablet Take 1 tablet (20 mg total) by mouth daily with breakfast. 30 tablet 1  . rosuvastatin (CRESTOR) 40 MG tablet Take 1 tablet (40 mg total) by mouth daily. 30 tablet 1     Discharge Medications: Please see discharge summary for a list of discharge medications.  Relevant Imaging Results:  Relevant Lab Results:   Additional Information [id number],  has had both covid vaccines and flu shot  [place], Therapist, sports

## 2020-04-10 NOTE — ED Notes (Signed)
Pt given hospital bed ..  

## 2020-04-10 NOTE — ED Notes (Signed)
Hospital bed requested.

## 2020-04-10 NOTE — TOC Initial Note (Addendum)
Transition of Care [facility]) - Initial/Assessment Note    Patient Details  Name: [name] MRN: [id number] Date of Birth: 1946/02/24  Transition of Care [facility]) CM/SW Contact:    Victorino Dike, RN Phone Number: 04/10/2020, 12:24 PM  Clinical Narrative:                  Met with patient in room.  She reports not having any living family.  She does receive nurse aide services at home.  She is agreeable to SNF for short term rehab.  Has agreed to bed search.  PASSR #, FL2 and bed search have been started.   Expected Discharge Plan: Skilled Nursing Facility Barriers to Discharge: SNF Pending bed offer (and PASSR # approval)   Patient Goals and CMS Choice Patient states their goals for this hospitalization and ongoing recovery are:: To go to rehab and become as independent as possible CMS Medicare.gov Compare Post Acute Care list provided to:: Patient Choice offered to / list presented to : Patient  Expected Discharge Plan and Services Expected Discharge Plan: [place]   Discharge Planning Services: CM Consult Post Acute Care Choice: [place] Living arrangements for the past 2 months: [place]                    Prior Living Arrangements/Services Living arrangements for the past 2 months: [place] Lives with:: Self Patient language and need for interpreter reviewed:: Yes Do you feel safe going back to the place where you live?: Yes      Need for Family Participation in Patient Care: No (Comment) Care giver support system in place?: Yes (comment)   Criminal Activity/Legal Involvement Pertinent to Current Situation/Hospitalization: No - Comment as needed     Emotional Assessment Appearance:: Appears stated age Attitude/Demeanor/Rapport: Ambitious, Engaged, Self-Confident, Gracious Affect (typically observed): Accepting, Appropriate, Calm Orientation: : Oriented to Self, Oriented to Place, Oriented  to  Time, Oriented to Situation Alcohol / Substance Use: Not Applicable Psych Involvement: No (comment)  Admission diagnosis:  EMS Fall Patient Active Problem List   Diagnosis Date Noted  . Bipolar disorder current episode depressed ([place]) 01/07/2020  . MDD (major depressive disorder) 08/10/2019  . MDD (major depressive disorder), recurrent episode, severe ([place]) 06/24/2019  . Pain due to onychomycosis of toenails of both feet 11/23/2018  . Bipolar 1 disorder, manic, moderate ([place]) 07/21/2018  . Bipolar 1 disorder, mixed, severe ([place]) 07/21/2018  . Bipolar 1 disorder with moderate mania ([place]) 07/20/2018  . Severe bipolar I disorder, current or most recent episode depressed ([place]) 07/07/2017  . PTSD (post-traumatic stress disorder) 07/07/2017  . 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 Diabetes ([place]) 09/23/2014  . Essential hypertension 09/21/2014  . Hypothyroidism 09/21/2014  . Dyslipidemia 09/21/2014  . Bipolar I disorder, current or most recent episode manic, severe with mixed features ([place]) 09/20/2014   PCP:  Casilda Carls, MD Pharmacy:   [facility] [address] [place] [address] Phone: [phone] Fax: [place], [place] [address] [place] [place] [address] Phone: [phone] Fax: [phone]

## 2020-04-10 NOTE — ED Notes (Signed)
Assumed care of pt at 2300, resting in room with lights dimmed for comfort, side rails up x2 in hospital bed. Denies pain. Provided with blankets per request. Pt utilizing call bell appropriately. Talking in full sentences with regular and unlabored breathing. AO x4

## 2020-04-10 NOTE — ED Notes (Signed)
Sent MD. Jacqualine Code CBG reading of 358

## 2020-04-10 NOTE — ED Notes (Signed)
Advised MD, of blood sugar 356

## 2020-04-10 NOTE — ED Provider Notes (Signed)
[facility] Emergency Department Provider Note   ____________________________________________   First MD Initiated Contact with Patient 04/10/20 [phone]     (approximate)  I have reviewed the triage vital signs and the nursing notes.   HISTORY  Chief Complaint Fall    HPI [name] is a 74 y.o. female history of breast cancer, diabetes, hypertension  Patient reports that she has had "numerous" falls where she will have to call EMS out to her home.  She reports that this morning she slowly slid out of her bed to the floor.  She did not become injured.  She reports it was very very slow and she just slid onto the floor.  Did not strike her head or become injured.  She laid on the floor for a couple of hours, and then decided to just sleep on the floor.  This morning she decided to call EMS for assistance getting  Denies injury.  She denies any headache.  No chest pain no trouble breathing no fevers or chills.  Reports she is seen in the ER just a few days ago for almost the same thing and that she continues to have trouble with generally feeling very weak and fatigued and when she gets onto the floor she cannot get herself up now for a few months time having to rely often on EMS or fire department for help  She does report that she has had visit from social worker possibly from the county but she reports they did not identify themselves, they had mentioned trying to get her into an assisted living she originally was not interested in that, but she reports now she would be interested in pursuing more of a 24-hour care setting since she is having so many times to have to have EMS come out of her home   Past Medical History:  Diagnosis Date  . Arthritis   . Breast cancer ([place])   . Cancer [facility]) 2004   right breast ca  . Diabetes mellitus without complication ([place])   . Hypercholesterolemia   . Hypertension   . Hypothyroid   . Seasonal allergies      Patient Active Problem List   Diagnosis Date Noted  . Bipolar disorder current episode depressed ([place]) 01/07/2020  . MDD (major depressive disorder) 08/10/2019  . MDD (major depressive disorder), recurrent episode, severe ([place]) 06/24/2019  . Pain due to onychomycosis of toenails of both feet 11/23/2018  . Bipolar 1 disorder, manic, moderate ([place]) 07/21/2018  . Bipolar 1 disorder, mixed, severe ([place]) 07/21/2018  . Bipolar 1 disorder with moderate mania ([place]) 07/20/2018  . Severe bipolar I disorder, current or most recent episode depressed ([place]) 07/07/2017  . PTSD (post-traumatic stress disorder) 07/07/2017  . 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 Diabetes ([place]) 09/23/2014  . Essential hypertension 09/21/2014  . Hypothyroidism 09/21/2014  . Dyslipidemia 09/21/2014  . Bipolar I disorder, current or most recent episode manic, severe with mixed features ([place]) 09/20/2014    Past Surgical History:  Procedure Laterality Date  . ABDOMINAL HYSTERECTOMY    . BREAST BIOPSY Left    neg  . BREAST EXCISIONAL BIOPSY Right 2004   breast ca and lumpectomy  . BREAST LUMPECTOMY Right 2004   breast ca  . CHOLECYSTECTOMY    . COLONOSCOPY WITH PROPOFOL N/A 02/13/2017   Procedure: COLONOSCOPY WITH PROPOFOL;  Surgeon: Jonathon Bellows, MD;  Location: [facility] ENDOSCOPY;  Service: Gastroenterology;  Laterality: N/A;  .  KIDNEY STONE SURGERY      Prior to Admission medications   Medication Sig Start Date End Date Taking? Authorizing Provider  ACCU-CHEK AVIVA PLUS test strip  07/26/19   [provider]  Accu-Chek Softclix Lancets lancets  07/26/19   [provider]  clonazePAM (KLONOPIN) 1 MG tablet Take 1 tablet (1 mg total) by mouth 2 (two) times daily. 01/11/20   Clapacs, Madie Reno, MD  divalproex (DEPAKOTE ER) 500 MG 24 hr tablet Take 2 tablets (1,000 mg total) by mouth at bedtime. 01/11/20   Clapacs, Madie Reno, MD  enalapril (VASOTEC) 5 MG tablet Take 1  tablet (5 mg total) by mouth 2 (two) times daily. 01/11/20   Clapacs, Madie Reno, MD  FLUoxetine (PROZAC) 20 MG capsule Take 3 capsules (60 mg total) by mouth daily. 01/11/20 02/10/20  Clapacs, Madie Reno, MD  glipiZIDE (GLUCOTROL XL) 10 MG 24 hr tablet Take 1 tablet (10 mg total) by mouth daily with breakfast. 01/12/20   Clapacs, Madie Reno, MD  glipiZIDE (GLUCOTROL XL) 5 MG 24 hr tablet Take 1 tablet (5 mg total) by mouth at bedtime. 01/11/20   Clapacs, Madie Reno, MD  levothyroxine (SYNTHROID) 88 MCG tablet Take 1 tablet (88 mcg total) by mouth daily. 03/01/20 03/31/20  Duffy Bruce, MD  linagliptin (TRADJENTA) 5 MG TABS tablet Take 1 tablet (5 mg total) by mouth 2 (two) times daily. 01/11/20   Clapacs, Madie Reno, MD  loratadine (CLARITIN) 10 MG tablet Take 1 tablet (10 mg total) by mouth daily. 01/11/20   Clapacs, Madie Reno, MD  lurasidone (LATUDA) 20 MG TABS tablet Take 1 tablet (20 mg total) by mouth daily with breakfast. 01/12/20   Clapacs, Madie Reno, MD  rosuvastatin (CRESTOR) 40 MG tablet Take 1 tablet (40 mg total) by mouth daily. 01/11/20   Clapacs, Madie Reno, MD    Allergies Navane [thiothixene] and Penicillins  Family History  Problem Relation Age of Onset  . Bladder Cancer Neg Hx   . Kidney cancer Neg Hx     Social History Social History   Tobacco Use  . Smoking status: Former Smoker    Types: Cigarettes  . Smokeless tobacco: Never Used  Vaping Use  . Vaping Use: Never used  Substance Use Topics  . Alcohol use: No  . Drug use: No    Review of Systems Constitutional: No fever/chills Eyes: No visual changes. ENT: No sore throat.  No neck pain Cardiovascular: Denies chest pain. Respiratory: Denies shortness of breath. Gastrointestinal: No abdominal pain.   Genitourinary: Negative for dysuria.  RePorts she had to urinate on herself while laying on the floor because she could not get to the bathroom but denies having incontinence Musculoskeletal: Some chronic lower back pain, no change.  Reports she  was recently told she has spinal stenosis. Skin: Negative for rash. Neurological: Negative for headaches, areas of focal weakness or numbness.    ____________________________________________   PHYSICAL EXAM:  VITAL SIGNS: ED Triage Vitals  Enc Vitals Group     BP 04/10/20 0322 104/64     Pulse Rate 04/10/20 0322 70     Resp 04/10/20 0322 18     Temp 04/10/20 0322 98.1 F (36.7 C)     Temp Source 04/10/20 0322 Oral     SpO2 04/10/20 0322 92 %     Weight 04/10/20 0323 174 lb (78.9 kg)     Height 04/10/20 0323 _0  (1.575 m)     Head Circumference --  Peak Flow --      Pain Score 04/10/20 0323 0     Pain Loc --      Pain Edu? --      Excl. in [place]? --     Constitutional: Alert and oriented. Well appearing and in no acute distress. Eyes: Conjunctivae are normal. Head: Atraumatic. Nose: No congestion/rhinnorhea. Mouth/Throat: Mucous membranes are moist. Neck: No stridor.  Cardiovascular: Normal rate, regular rhythm. Grossly normal heart sounds.  Good peripheral circulation. Respiratory: Normal respiratory effort.  No retractions. Lungs CTAB. Gastrointestinal: Soft and nontender. No distention. Musculoskeletal: No lower extremity tenderness nor edema.  She demonstrates good strength ability to raise and lower legs move her feet well as well as her arms bilaterally.  She does not have any difficulty resisting gravity Neurologic:  Normal speech and language. No gross focal neurologic deficits are appreciated.  Skin:  Skin is warm, dry and intact. No rash noted. Psychiatric: Mood and affect are normal. Speech and behavior are normal.  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  Similar in comparison to 04/06/2020 ____________________________________________  RADIOLOGY  Did not see evidence of need for imaging at this time  Patient has no report of trauma to the head, remembers the incident and reports she did not become injured.  Additionally she had recent MRI of her lumbar spine and work-up, and she reports mild lower back discomfort no significant worsening.  She has no notable or focal weakness in her lower extremities at this point, and additionally afebrile.  She had work-up including normal ESR on her last ER visit. ____________________________________________   PROCEDURES  Procedure(s) performed: None  Procedures  Critical Care performed: No  ____________________________________________   INITIAL IMPRESSION / ASSESSMENT AND PLAN / ED COURSE  Pertinent labs & imaging results that were available during my care of the patient were reviewed by me and considered in my medical decision making (see chart for details).   Patient presents for evaluation, reports multiple falls also reports having to call EMS frequently to her home.  She denies injury reports she laid on the floor for about 2 hours and then called EMS after sleeping on the floor.  She does not have any evidence of distress or acute neurologic symptoms.  No evidence of trauma.  I think at this part the patient reports she is now amenable to considering an increased level of care either at home or potentially placement for her frequent falls and what appears to be somewhat deconditioning as well.  Placed consult to transition of care team as well as physical therapy.  Additionally, she had recent fairly extensive work-up including imaging of her  lumbar spine in the ER just a few days ago and tells me she was referred to go see Dr. Cari Caraway which she was planning to call for a follow-up visit  No acute cardiac or pulmonary symptoms.  Denies infectious  symptoms.  Urinalysis and blood work done just recently.  We will recheck basic labs BMP CK    ----------------------------------------- 9:23 AM on 04/10/2020 -----------------------------------------  Patient's labs reviewed, no acute abnormality noted.  GFR near her recent baseline, will provide small fluid bolus here for hydration.  CBC is normal.  CK is normal.  Valproic acid and TSH appropriate  Patient medically appropriate for further consultation with our case management team.  I do not see evidence of acute injury or medical condition at this time other than ongoing difficulty with ambulation and generalized weakness.  Frequent falls.   ----------------------------------------- 3:35 PM on 04/10/2020 -----------------------------------------  Patient awaiting disposition planning is through case management team.  Ongoing care signed Dr. Cherylann Banas  ____________________________________________   FINAL CLINICAL IMPRESSION(S) / ED DIAGNOSES  Final diagnoses:  Fall, initial encounter        Note:  This document was prepared using Dragon voice recognition software and may include unintentional dictation errors       Delman Kitten, MD 04/10/20 1535

## 2020-04-10 NOTE — ED Triage Notes (Addendum)
Patient to ED after a fall.  Reports multiple falls since February.  States having difficulty getting up.  Patient seen in ED 04/06/2020 for same.  Patient denies any other complaint than the fall, denies pain.

## 2020-04-10 NOTE — Evaluation (Signed)
Physical Therapy Evaluation Patient Details Name: [name] MRN: [id number] DOB: 21-Mar-1946 Today's Date: 04/10/2020   History of Present Illness  [name] is a 43yoF who comes to [facility] on 11/22 after fall at home, down to floor for 5 hours. Pt has an extensive history of falls within the past year. PMH: BrCA, DM, HTN, depression, bipolar. Pt reports progressively worsening weakness in bilat legs when up, able to walk 3-4 steps prior to falling. PT recommended STR back in Feb, pt returned to home without any services, was given a RW. Ptr denies any LOC, preceeding dizziness, tripping event.  Clinical Impression  Pt admitted with above diagnosis. Pt currently with functional limitations due to the deficits listed below (see "PT Problem List"). Upon entry, pt in bed, awake and agreeable to participate. Pt working on breakfast tray. The pt is alert and oriented x3, pleasant, conversational, and generally a good historian, but lacks detail when asked regarding her falling episodes and function. Hands-on testing reveals good strength, ROM of BLE, normal sensation in BLE, no physical assist needed to move  To/from supine/seated EOB, nor to rise to standing from elevated surface. Pt reports progressive slow weakness of legs over 60 sec of standing, hence AMB deferred at this time due to equipment restraint. Supine BP: 126/95; Seated BP: 146/89. Pt has has minimal medical workup/imaging at this time. Functional mobility assessment demonstrates increased effort/time requirements, poor tolerance, and need for physical assistance, whereas the patient performed these at a higher level of independence PTA. Based on pt reports alone and level of assist available at DC, pt would be ill advised to return to home alone at this time, would be a good candidate for STR at DC to recover safe household AMB. Pt will benefit from skilled PT intervention to increase independence and safety with basic mobility in  preparation for discharge to the venue listed below.       Follow Up Recommendations SNF;Supervision for mobility/OOB    Equipment Recommendations  None recommended by PT    Recommendations for Other Services       Precautions / Restrictions Precautions Precautions: Fall Restrictions Weight Bearing Restrictions: No      Mobility  Bed Mobility Overal bed mobility: Independent                  Transfers Overall transfer level: Needs assistance   Transfers: Sit to/from Stand Sit to Stand: From elevated surface;Min guard         General transfer comment: able to rise to standing from ED gurney, no chair in room; pt too short to assist back into sitting if help is needed. Tolerates standing x 60sec, reports less feelign progressively more weak.  Ambulation/Gait Ambulation/Gait assistance:  (deferred for safety reasons, no RW or chair in room.)              Stairs            Wheelchair Mobility    Modified Rankin (Stroke Patients Only)       Balance Overall balance assessment: History of Falls;No apparent balance deficits (not formally assessed);Needs assistance   Sitting balance-Leahy Scale: Normal                                       Pertinent Vitals/Pain Pain Assessment: No/denies pain    Home Living Family/patient expects to be discharged to:: Private residence Living Arrangements:  Alone   Type of Home: Apartment Home Access: Stairs to enter Entrance Stairs-Rails: None Entrance Stairs-Number of Steps: 1 small step to access apartment area Home Layout: One level Home Equipment: Clinical cytogeneticist - 4 wheels;Walker - 2 wheels      Prior Function Level of Independence: Needs assistance   Gait / Transfers Assistance Needed: household AMB Bennett RW; Last few week sonly tolerating 3-4 steps at a time before falling (legs give out/rubbery)  ADL's / Homemaking Assistance Needed: does her own cooking, has an aide that takes  her to store or orders [employer] grocereies; takes Printmaker to appointments        Hand Dominance        Extremity/Trunk Assessment   Upper Extremity Assessment Upper Extremity Assessment: Overall WFL for tasks assessed (grips, elbows grossly strong)    Lower Extremity Assessment Lower Extremity Assessment: Overall WFL for tasks assessed (Can SLR in supine; 5/5 ankle DF/PF; no myoclonus, sensation normal throughout BLE)       Communication      Cognition Arousal/Alertness: Awake/alert Behavior During Therapy: WFL for tasks assessed/performed Overall Cognitive Status: Within Functional Limits for tasks assessed                                        General Comments      Exercises     Assessment/Plan    PT Assessment Patient needs continued PT services  PT Problem List Decreased activity tolerance;Decreased balance;Decreased mobility       PT Treatment Interventions DME instruction;Gait training;Functional mobility training;Therapeutic activities;Therapeutic exercise    PT Goals (Current goals can be found in the Care Plan section)  Acute Rehab PT Goals Patient Stated Goal: stop falling, improve AMB capcity, be able to rise from floor independently PT Goal Formulation: With patient Time For Goal Achievement: 04/24/20 Potential to Achieve Goals: Good    Frequency Min 2X/week   Barriers to discharge Decreased caregiver support;Inaccessible home environment      Co-evaluation               AM-PAC PT "6 Clicks" Mobility  Outcome Measure Help needed turning from your back to your side while in a flat bed without using bedrails?: None Help needed moving from lying on your back to sitting on the side of a flat bed without using bedrails?: A Little Help needed moving to and from a bed to a chair (including a wheelchair)?: A Little Help needed standing up from a chair using your arms (e.g., wheelchair or bedside chair)?: A Lot Help needed to walk  in hospital room?: A Lot Help needed climbing 3-5 steps with a railing? : A Lot 6 Click Score: 16    End of Session   Activity Tolerance: Patient tolerated treatment well;No increased pain Patient left: in bed;with call bell/phone within reach   PT Visit Diagnosis: History of falling (Z91.81);Repeated falls (R29.6);Difficulty in walking, not elsewhere classified (R26.2)    Time: 2197-5883 PT Time Calculation (min) (ACUTE ONLY): 21 min   Charges:   PT Evaluation $PT Eval Low Complexity: 1 Low          10:07 AM, 04/10/20 [name], PT, DPT Physical Therapist - [facility]  [phone] ([place])   [name] 04/10/2020, 10:02 AM

## 2020-04-10 NOTE — ED Notes (Signed)
Pt given breakfast tray by EDT

## 2020-04-11 DIAGNOSIS — R296 Repeated falls: Secondary | ICD-10-CM | POA: Diagnosis not present

## 2020-04-11 LAB — CBG MONITORING, ED: Glucose-Capillary: 268 mg/dL — ABNORMAL HIGH (ref 70–99)

## 2020-04-11 MED ORDER — HYDROXYZINE HCL 25 MG PO TABS
50.0000 mg | ORAL_TABLET | Freq: Once | ORAL | Status: AC
  Administered 2020-04-11: 50 mg via ORAL
  Filled 2020-04-11: qty 2

## 2020-04-11 NOTE — ED Notes (Signed)
Pt resting in room with eyes closed, awakens to RN entering room. Chest rise and fall symmetrical upon observations. Side rails up x2, call bell within reach

## 2020-04-11 NOTE — TOC Progression Note (Signed)
Transition of Care [facility]) - Progression Note    Patient Details  Name: Mekaila Tarnow MRN: [id number] Date of Birth: June 05, 1945  Transition of Care [facility]) CM/SW [place], RN Phone Number: 04/11/2020, 12:37 PM  Clinical Narrative:     Bed accepted at [facility].  Submitted Insurance auth at [employer] today.   Expected Discharge Plan: [place] Barriers to Discharge: SNF Pending bed offer (and PASSR # approval)  Expected Discharge Plan and Services Expected Discharge Plan: [place]   Discharge Planning Services: CM Consult Post Acute Care Choice: [place] Living arrangements for the past 2 months: [place]                                       Social Determinants of Health (SDOH) Interventions    Readmission Risk Interventions No flowsheet data found.

## 2020-04-11 NOTE — ED Notes (Signed)
Pt resting in room with eyes closed, awakens to RN touch of pt shoulder. Chest rise and fall symmetrical, pt not on monitor as it was hindering her ability to sleep. AOx4

## 2020-04-11 NOTE — ED Provider Notes (Signed)
-----------------------------------------   6:43 AM on 04/11/2020 -----------------------------------------   Blood pressure 114/68, pulse 63, temperature 98.1 F (36.7 C), temperature source Oral, resp. rate 18, height 5\' 2"  (1.575 m), weight 78.9 kg, SpO2 93 %.  The patient is resting at this time.  There have been no acute events since the last update.  Awaiting disposition plan from Social Work team.   Paulette Blanch, MD 04/11/20 [phone]

## 2020-04-11 NOTE — ED Notes (Signed)
Pt assisted to bedside commode by RN and back into bed, remains utilizing call bell appropriately

## 2020-04-11 NOTE — ED Notes (Signed)
Pt communicated feeling anxious an agitated, unable to sleep. Dr. Beather Arbour made aware. Pt medicated per [facility]

## 2020-04-11 NOTE — ED Notes (Signed)
Pt desat to 88-90%, placed on 4L [place] with improvement to 94-96%, pt prompted to breath through nose as that is where oxygen is being delivered, denies sob

## 2020-04-11 NOTE — Progress Notes (Signed)
Inpatient Diabetes Program Recommendations  AACE/ADA: New Consensus Statement on Inpatient Glycemic Control (2015)  Target Ranges:  Prepandial:   less than 140 mg/dL      Peak postprandial:   less than 180 mg/dL (1-2 hours)      Critically ill patients:  140 - 180 mg/dL   Lab Results  Component Value Date   GLUCAP 268 (H) 04/11/2020   HGBA1C 9.2 (H) 01/10/2020    Review of Glycemic Control Results for [name], [name] (MRN [id number]) as of 04/11/2020 13:10  Ref. Range 04/10/2020 13:27 04/10/2020 14:57 04/10/2020 18:57 04/11/2020 06:58  Glucose-Capillary Latest Ref Range: 70 - 99 mg/dL 372 (H) 358 (H) 356 (H) 268 (H)   Inpatient Diabetes Program Recommendations:   Consider Lantus 15 units daily for elevated CBGs.  Thank you, Nani Gasser. Kariem Wolfson, RN, MSN, CDE  Diabetes Coordinator Inpatient Glycemic Control Team Team Pager [phone] (8am-5pm) 04/11/2020 1:36 PM

## 2020-04-12 DIAGNOSIS — R296 Repeated falls: Secondary | ICD-10-CM | POA: Diagnosis not present

## 2020-04-12 NOTE — TOC Transition Note (Signed)
Transition of Care [facility]) - CM/SW Discharge Note   Patient Details  Name: [name] MRN: [id number] Date of Birth: [birth year]  [facility] CM/SW Contact:  Victorino Dike, RN Phone Number: 04/12/2020, 9:45 AM   Clinical Narrative:     Patient has accepted Bed offer to [facility].  She will be transferring today by EMS.  RN to call report to [phone].   Final next level of care: Skilled Nursing Facility Barriers to Discharge: Barriers Resolved   Patient Goals and CMS Choice Patient states their goals for this hospitalization and ongoing recovery are:: To go to rehab and become as independent as possible CMS Medicare.gov Compare Post Acute Care list provided to:: Patient Choice offered to / list presented to : Patient  Discharge Placement           Patient chooses bed at: [facility] and Services   Discharge Planning Services: CM Consult Post Acute Care Choice: [place]

## 2020-04-12 NOTE — ED Notes (Signed)
Assumed care of pt at this time. Pt moved to room 26 on hospital bed. Pt given sprite per request. Pt denies further needs at this time.

## 2020-04-12 NOTE — ED Notes (Signed)
Pt called out using call bell. Pt found to have wet bed with urine. Bed linens changed. Purewick repositioned. Brief and chux pad placed. Pt denies further needs at this time.

## 2020-04-12 NOTE — ED Provider Notes (Signed)
Patient seen and cleared by Psychiatry, social work. Will be placed at [facility]. Pt remains HDS, non-toxic, no apparent emergent medical condition. Reviewed and updated MAR, will d/c to facility.   Duffy Bruce, MD 04/12/20 1006

## 2020-04-12 NOTE — ED Notes (Signed)
Pt assisted up to bedside commode with two person assist. Urine output of 20 ml. Pt assisted back into bed. Purewick put into place connected to suction. Denies further needs at this time.

## 2020-04-12 NOTE — ED Provider Notes (Signed)
-----------------------------------------   8:05 AM on 04/12/2020 -----------------------------------------   Blood pressure (!) 97/59, pulse 65, temperature 97.8 F (36.6 C), temperature source Oral, resp. rate 16, height 1.575 m (5\' 2" ), weight 78.9 kg, SpO2 91 %.  The patient is calm and cooperative at this time.  There have been no acute events since the last update.  Awaiting disposition plan from transition of care/social work team.   Hinda Kehr, MD 04/12/20 [phone]

## 2020-05-08 ENCOUNTER — Ambulatory Visit: Payer: Medicare HMO | Admitting: Podiatry

## 2020-07-24 ENCOUNTER — Encounter: Payer: Self-pay | Admitting: Internal Medicine

## 2020-07-24 ENCOUNTER — Emergency Department: Payer: Medicare Other

## 2020-07-24 ENCOUNTER — Other Ambulatory Visit: Payer: Self-pay

## 2020-07-24 ENCOUNTER — Inpatient Hospital Stay
Admission: EM | Admit: 2020-07-24 | Discharge: 2020-08-02 | DRG: 638 | Disposition: A | Discharge status: Skilled Nursing Facility | Payer: Medicare Other | Attending: Internal Medicine | Admitting: Internal Medicine

## 2020-07-24 DIAGNOSIS — Z20822 Contact with and (suspected) exposure to covid-19: Secondary | ICD-10-CM | POA: Diagnosis present

## 2020-07-24 DIAGNOSIS — E7800 Pure hypercholesterolemia, unspecified: Secondary | ICD-10-CM | POA: Diagnosis present

## 2020-07-24 DIAGNOSIS — Z888 Allergy status to other drugs, medicaments and biological substances status: Secondary | ICD-10-CM | POA: Diagnosis not present

## 2020-07-24 DIAGNOSIS — E1169 Type 2 diabetes mellitus with other specified complication: Secondary | ICD-10-CM

## 2020-07-24 DIAGNOSIS — Z880 Allergy status to penicillin: Secondary | ICD-10-CM | POA: Diagnosis not present

## 2020-07-24 DIAGNOSIS — Z87891 Personal history of nicotine dependence: Secondary | ICD-10-CM

## 2020-07-24 DIAGNOSIS — R9431 Abnormal electrocardiogram [ECG] [EKG]: Secondary | ICD-10-CM | POA: Diagnosis not present

## 2020-07-24 DIAGNOSIS — R531 Weakness: Secondary | ICD-10-CM

## 2020-07-24 DIAGNOSIS — F314 Bipolar disorder, current episode depressed, severe, without psychotic features: Secondary | ICD-10-CM | POA: Diagnosis present

## 2020-07-24 DIAGNOSIS — Z9114 Patient's other noncompliance with medication regimen: Secondary | ICD-10-CM

## 2020-07-24 DIAGNOSIS — E785 Hyperlipidemia, unspecified: Secondary | ICD-10-CM

## 2020-07-24 DIAGNOSIS — E1110 Type 2 diabetes mellitus with ketoacidosis without coma: Principal | ICD-10-CM | POA: Diagnosis present

## 2020-07-24 DIAGNOSIS — Z853 Personal history of malignant neoplasm of breast: Secondary | ICD-10-CM | POA: Diagnosis not present

## 2020-07-24 DIAGNOSIS — E039 Hypothyroidism, unspecified: Secondary | ICD-10-CM | POA: Diagnosis present

## 2020-07-24 DIAGNOSIS — Z79890 Hormone replacement therapy (postmenopausal): Secondary | ICD-10-CM | POA: Diagnosis not present

## 2020-07-24 DIAGNOSIS — R55 Syncope and collapse: Secondary | ICD-10-CM | POA: Diagnosis present

## 2020-07-24 DIAGNOSIS — Z7984 Long term (current) use of oral hypoglycemic drugs: Secondary | ICD-10-CM | POA: Diagnosis not present

## 2020-07-24 DIAGNOSIS — Z23 Encounter for immunization: Secondary | ICD-10-CM

## 2020-07-24 DIAGNOSIS — Z79899 Other long term (current) drug therapy: Secondary | ICD-10-CM

## 2020-07-24 DIAGNOSIS — E119 Type 2 diabetes mellitus without complications: Secondary | ICD-10-CM

## 2020-07-24 DIAGNOSIS — I10 Essential (primary) hypertension: Secondary | ICD-10-CM | POA: Diagnosis present

## 2020-07-24 LAB — BASIC METABOLIC PANEL
Anion gap: 19 — ABNORMAL HIGH (ref 5–15)
Anion gap: 13 (ref 5–15)
Anion gap: 9 (ref 5–15)
Anion gap: 8 (ref 5–15)
BUN: 42 mg/dL — ABNORMAL HIGH (ref 8–23)
BUN: 36 mg/dL — ABNORMAL HIGH (ref 8–23)
BUN: 31 mg/dL — ABNORMAL HIGH (ref 8–23)
BUN: 27 mg/dL — ABNORMAL HIGH (ref 8–23)
CO2: 17 mmol/L — ABNORMAL LOW (ref 22–32)
CO2: 18 mmol/L — ABNORMAL LOW (ref 22–32)
CO2: 20 mmol/L — ABNORMAL LOW (ref 22–32)
CO2: 21 mmol/L — ABNORMAL LOW (ref 22–32)
Calcium: 9.3 mg/dL (ref 8.9–10.3)
Calcium: 8.6 mg/dL — ABNORMAL LOW (ref 8.9–10.3)
Calcium: 8.5 mg/dL — ABNORMAL LOW (ref 8.9–10.3)
Calcium: 8.4 mg/dL — ABNORMAL LOW (ref 8.9–10.3)
Chloride: 103 mmol/L (ref 98–111)
Chloride: 104 mmol/L (ref 98–111)
Chloride: 107 mmol/L (ref 98–111)
Chloride: 105 mmol/L (ref 98–111)
Creatinine, Ser: 1.48 mg/dL — ABNORMAL HIGH (ref 0.44–1.00)
Creatinine, Ser: 1.13 mg/dL — ABNORMAL HIGH (ref 0.44–1.00)
Creatinine, Ser: 1 mg/dL (ref 0.44–1.00)
Creatinine, Ser: 0.93 mg/dL (ref 0.44–1.00)
GFR, Estimated: 37 mL/min — ABNORMAL LOW (ref >60)
GFR, Estimated: 51 mL/min — ABNORMAL LOW (ref >60)
GFR, Estimated: 59 mL/min — ABNORMAL LOW (ref >60)
GFR, Estimated: >60 mL/min (ref >60)
Glucose, Bld: 448 mg/dL — ABNORMAL HIGH (ref 70–99)
Glucose, Bld: 372 mg/dL — ABNORMAL HIGH (ref 70–99)
Glucose, Bld: 150 mg/dL — ABNORMAL HIGH (ref 70–99)
Glucose, Bld: 286 mg/dL — ABNORMAL HIGH (ref 70–99)
Potassium: 4.8 mmol/L (ref 3.5–5.1)
Potassium: 3.8 mmol/L (ref 3.5–5.1)
Potassium: 3.8 mmol/L (ref 3.5–5.1)
Potassium: 4 mmol/L (ref 3.5–5.1)
Sodium: 139 mmol/L (ref 135–145)
Sodium: 135 mmol/L (ref 135–145)
Sodium: 136 mmol/L (ref 135–145)
Sodium: 134 mmol/L — ABNORMAL LOW (ref 135–145)

## 2020-07-24 LAB — GLUCOSE, CAPILLARY
Glucose-Capillary: 454 mg/dL — ABNORMAL HIGH (ref 70–99)
Glucose-Capillary: 288 mg/dL — ABNORMAL HIGH (ref 70–99)
Glucose-Capillary: 183 mg/dL — ABNORMAL HIGH (ref 70–99)
Glucose-Capillary: 213 mg/dL — ABNORMAL HIGH (ref 70–99)
Glucose-Capillary: 115 mg/dL — ABNORMAL HIGH (ref 70–99)
Glucose-Capillary: 184 mg/dL — ABNORMAL HIGH (ref 70–99)
Glucose-Capillary: 289 mg/dL — ABNORMAL HIGH (ref 70–99)
Glucose-Capillary: 268 mg/dL — ABNORMAL HIGH (ref 70–99)
Glucose-Capillary: 186 mg/dL — ABNORMAL HIGH (ref 70–99)

## 2020-07-24 LAB — CBC WITH DIFFERENTIAL/PLATELET
Abs Immature Granulocytes: 0.16 10*3/uL — ABNORMAL HIGH (ref 0.00–0.07)
Basophils Absolute: 0 10*3/uL (ref 0.0–0.1)
Basophils Relative: 1 %
Eosinophils Absolute: 0 10*3/uL (ref 0.0–0.5)
Eosinophils Relative: 0 %
HCT: 50 % — ABNORMAL HIGH (ref 36.0–46.0)
Hemoglobin: 16.2 g/dL — ABNORMAL HIGH (ref 12.0–15.0)
Immature Granulocytes: 2 %
Lymphocytes Relative: 16 %
Lymphs Abs: 1.3 10*3/uL (ref 0.7–4.0)
MCH: 29.2 pg (ref 26.0–34.0)
MCHC: 32.4 g/dL (ref 30.0–36.0)
MCV: 90.3 fL (ref 80.0–100.0)
Monocytes Absolute: 0.6 10*3/uL (ref 0.1–1.0)
Monocytes Relative: 7 %
Neutro Abs: 6 10*3/uL (ref 1.7–7.7)
Neutrophils Relative %: 74 %
Platelets: 186 10*3/uL (ref 150–400)
RBC: 5.54 MIL/uL — ABNORMAL HIGH (ref 3.87–5.11)
RDW: 12.6 % (ref 11.5–15.5)
WBC: 8.1 10*3/uL (ref 4.0–10.5)
nRBC: 0 % (ref 0.0–0.2)

## 2020-07-24 LAB — URINALYSIS, COMPLETE (UACMP) WITH MICROSCOPIC
Bilirubin Urine: NEGATIVE
Glucose, UA: >=500 mg/dL — AB
Ketones, ur: 80 mg/dL — AB
Leukocytes,Ua: NEGATIVE
Nitrite: NEGATIVE
Protein, ur: NEGATIVE mg/dL
Specific Gravity, Urine: 1.027 (ref 1.005–1.030)
Squamous Epithelial / HPF: NONE SEEN (ref 0–5)
pH: 5 (ref 5.0–8.0)

## 2020-07-24 LAB — CBG MONITORING, ED
Glucose-Capillary: 412 mg/dL — ABNORMAL HIGH (ref 70–99)
Glucose-Capillary: 416 mg/dL — ABNORMAL HIGH (ref 70–99)

## 2020-07-24 LAB — LACTIC ACID, PLASMA: Lactic Acid, Venous: 2 mmol/L (ref 0.5–1.9)

## 2020-07-24 LAB — SALICYLATE LEVEL: Salicylate Lvl: <7 mg/dL — ABNORMAL LOW (ref 7.0–30.0)

## 2020-07-24 LAB — BETA-HYDROXYBUTYRIC ACID: Beta-Hydroxybutyric Acid: 4.04 mmol/L — ABNORMAL HIGH (ref 0.05–0.27)

## 2020-07-24 LAB — PROTIME-INR
INR: 1.1 (ref 0.8–1.2)
Prothrombin Time: 13.2 seconds (ref 11.4–15.2)

## 2020-07-24 LAB — TROPONIN I (HIGH SENSITIVITY)
Troponin I (High Sensitivity): 5 ng/L (ref <18)
Troponin I (High Sensitivity): 4 ng/L (ref <18)

## 2020-07-24 LAB — HEMOGLOBIN A1C
Hgb A1c MFr Bld: 14.2 % — ABNORMAL HIGH (ref 4.8–5.6)
Mean Plasma Glucose: 360.84 mg/dL

## 2020-07-24 LAB — CK: Total CK: 127 U/L (ref 38–234)

## 2020-07-24 LAB — MRSA PCR SCREENING: MRSA by PCR: NEGATIVE

## 2020-07-24 LAB — SARS CORONAVIRUS 2 (TAT 6-24 HRS): SARS Coronavirus 2: NEGATIVE

## 2020-07-24 IMAGING — CT CT HEAD W/O CM
3 series · 16 of 45 positions shown, 19 images · non-contrast
Comparison: [DATE]

CLINICAL DATA: Facial trauma.  Fall with occipital swelling.

EXAM:
CT HEAD WITHOUT CONTRAST
TECHNIQUE: Contiguous axial images were obtained from the base of the skull
through the vertex without intravenous contrast.

[Series 2: head wo · axial · 0.40mm/px · z∈[-157,-42]mm · 10 of 28 slices shown, 13 images]
[im 3/28  brain]
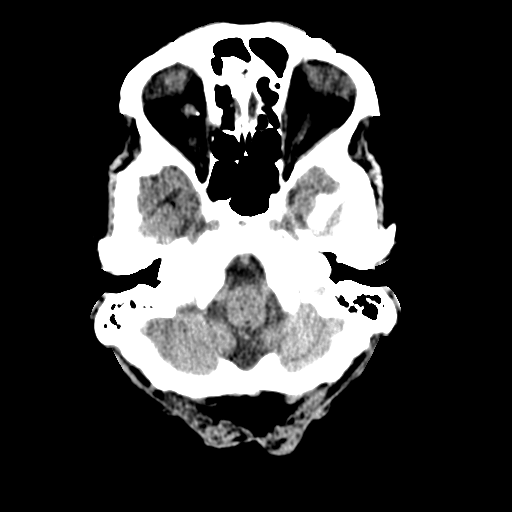
[im 3/28  bone]
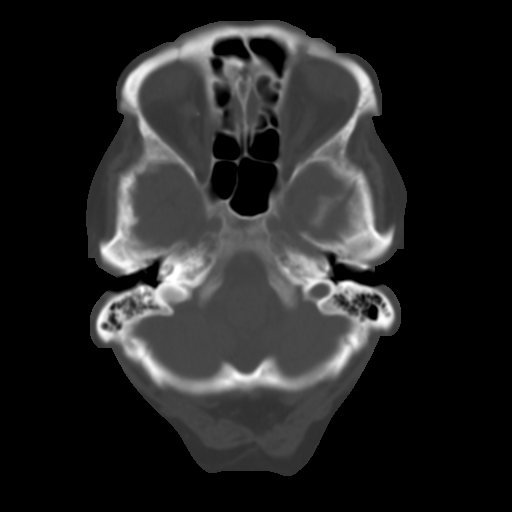
[im 5/28  brain]
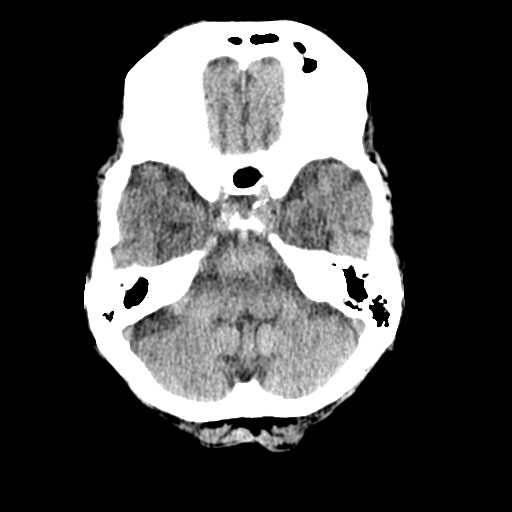
[im 8/28  brain]
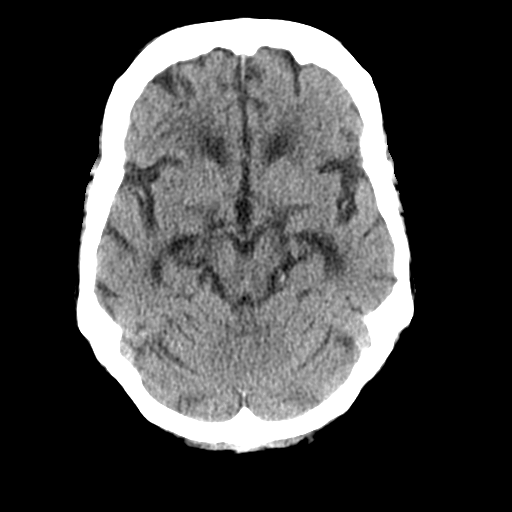
[im 11/28  brain]
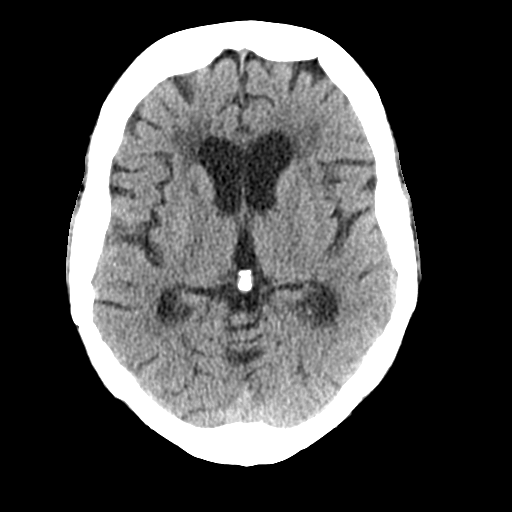
[im 13/28  brain]
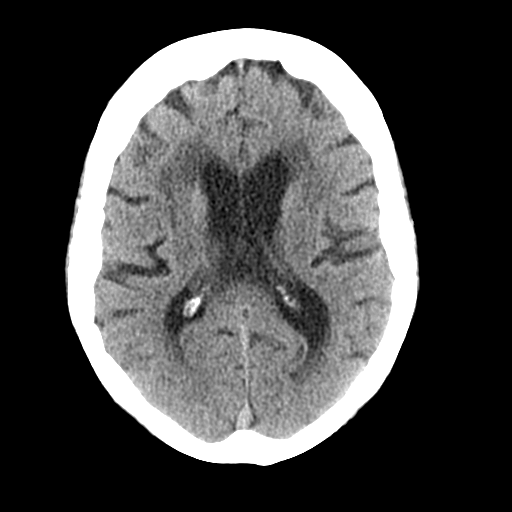
[im 13/28  bone]
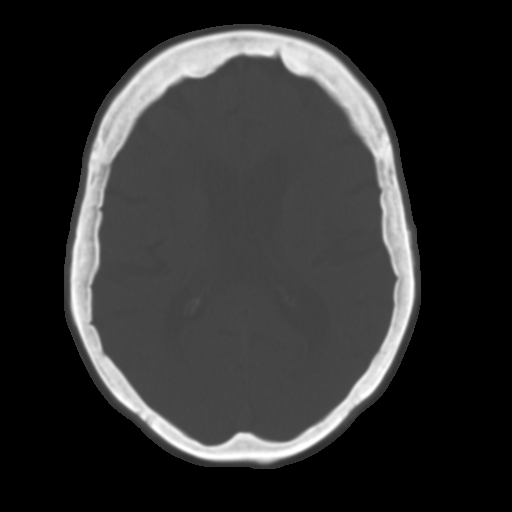
[im 16/28  brain]
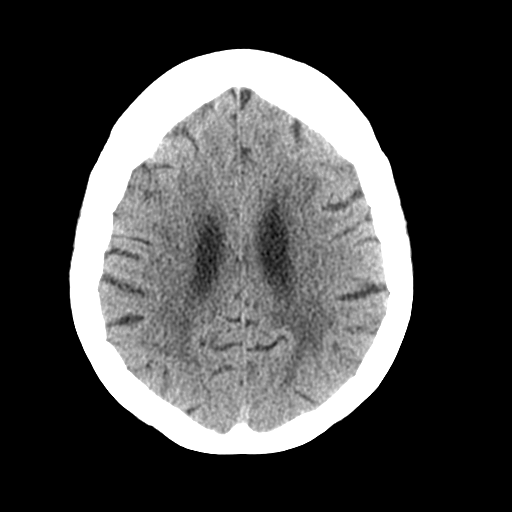
[im 18/28  brain]
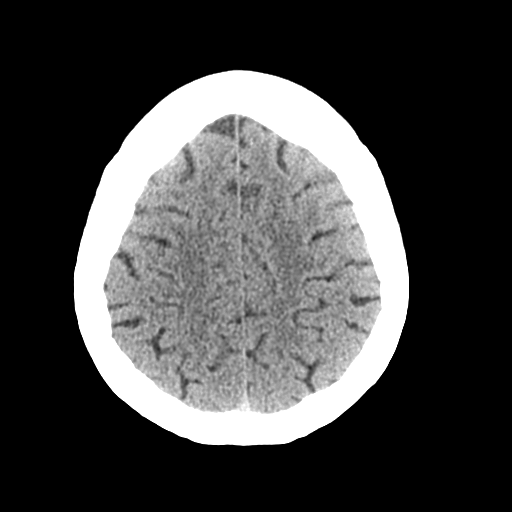
[im 21/28  brain]
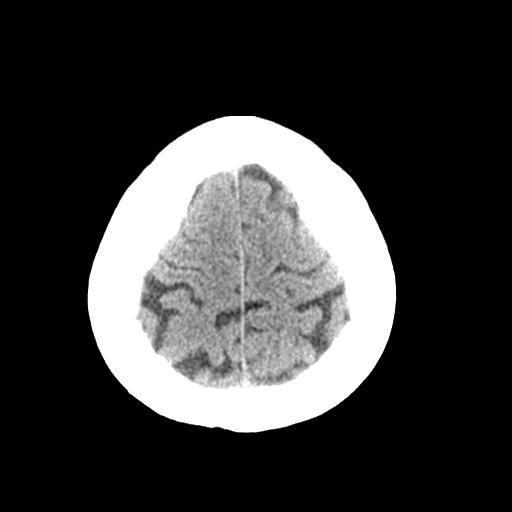
[im 24/28  brain]
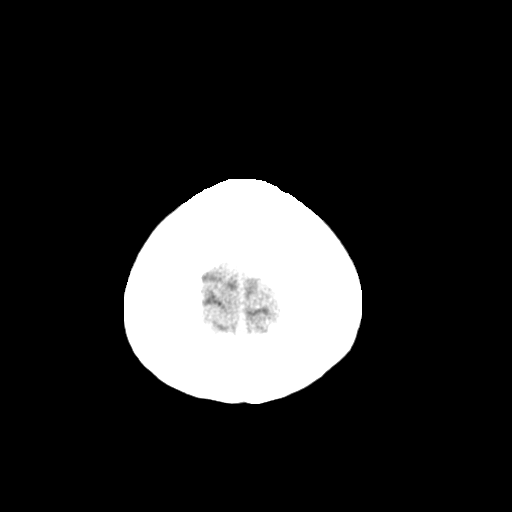
[im 24/28  bone]
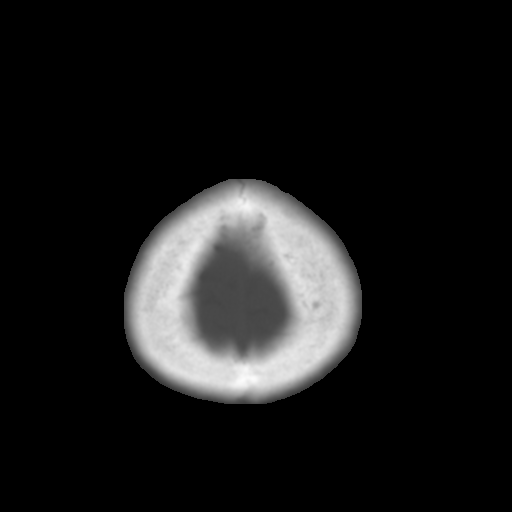
[im 26/28  brain]
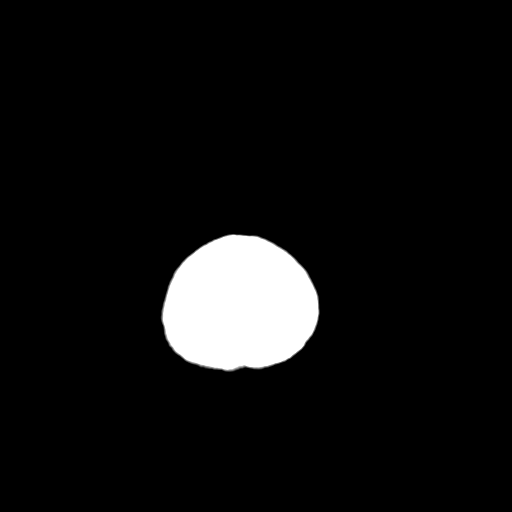

[Series 4: coronal soft tissue · coronal · 0.30mm/px · 3 of 63 slices shown]
[im 21/63  brain]
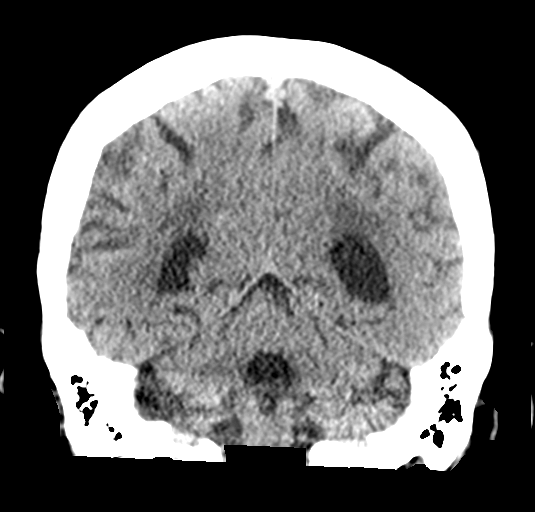
[im 28/63  brain]
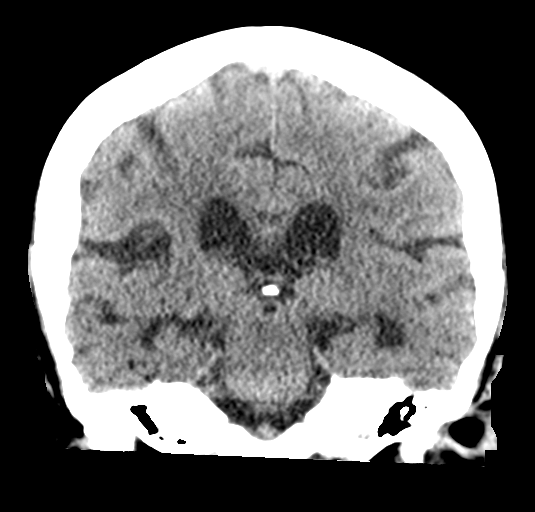
[im 35/63  brain]
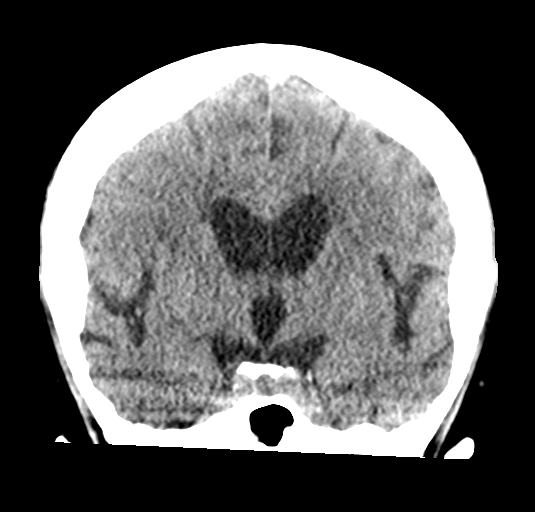

[Series 5: sagittal soft tissue · sagittal · 0.30mm/px · 3 of 54 slices shown]
[im 18/54  brain]
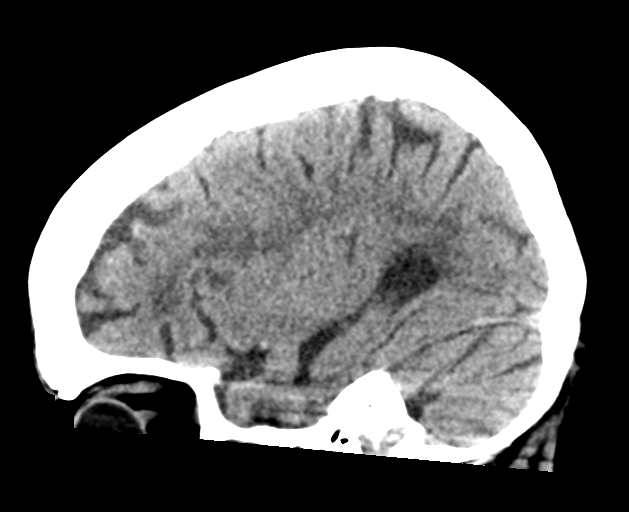
[im 27/54  brain]
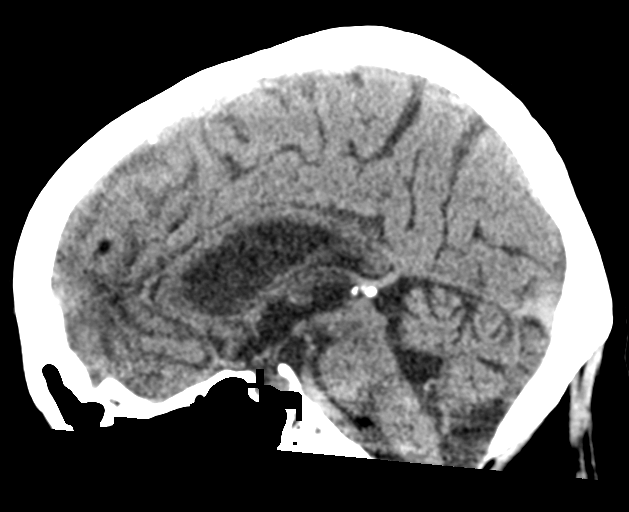
[im 36/54  brain]
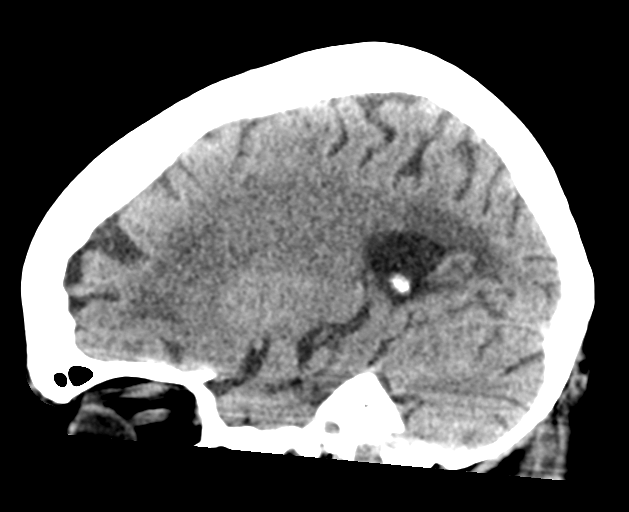

[16 of 45 positions shown; findings below may reference images not displayed]

FINDINGS: Brain: No evidence of acute infarction, hemorrhage, hydrocephalus,
extra-axial collection or mass lesion/mass effect. Central
predominant cerebral volume loss. Chronic small vessel ischemia in
the deep white matter.

Vascular: No hyperdense vessel or unexpected calcification.

Skull: Normal. Negative for fracture or focal lesion.

Sinuses/Orbits: No acute finding.
IMPRESSION: No evidence of intracranial injury.

## 2020-07-24 MED ORDER — CHLORHEXIDINE GLUCONATE CLOTH 2 % EX PADS
6.0000 | MEDICATED_PAD | Freq: Every day | CUTANEOUS | Status: DC
  Administered 2020-07-26 – 2020-08-02 (×5): 6 via TOPICAL

## 2020-07-24 MED ORDER — DEXTROSE IN LACTATED RINGERS 5 % IV SOLN: INTRAVENOUS | Status: DC

## 2020-07-24 MED ORDER — SODIUM CHLORIDE 0.9 % IV BOLUS
1000.0000 mL | Freq: Once | INTRAVENOUS | Status: AC
  Administered 2020-07-24: 1000 mL via INTRAVENOUS

## 2020-07-24 MED ORDER — LACTATED RINGERS IV BOLUS
20.0000 mL/kg | Freq: Once | INTRAVENOUS | Status: AC
  Administered 2020-07-24: 1572 mL via INTRAVENOUS

## 2020-07-24 MED ORDER — DIVALPROEX SODIUM ER 500 MG PO TB24
1000.0000 mg | ORAL_TABLET | Freq: Every day | ORAL | Status: DC
  Administered 2020-07-24 – 2020-08-01 (×9): 1000 mg via ORAL
  Filled 2020-07-24 (×4): qty 2
  Filled 2020-07-24 (×2): qty 4
  Filled 2020-07-24 (×4): qty 2

## 2020-07-24 MED ORDER — LEVOTHYROXINE SODIUM 88 MCG PO TABS
88.0000 ug | ORAL_TABLET | Freq: Every day | ORAL | Status: DC
  Administered 2020-07-25 – 2020-08-02 (×9): 88 ug via ORAL
  Filled 2020-07-24 (×10): qty 1

## 2020-07-24 MED ORDER — POTASSIUM CHLORIDE 10 MEQ/100ML IV SOLN
10.0000 meq | INTRAVENOUS | Status: AC
  Administered 2020-07-24 (×2): 10 meq via INTRAVENOUS
  Filled 2020-07-24 (×2): qty 100

## 2020-07-24 MED ORDER — POTASSIUM CHLORIDE 10 MEQ/100ML IV SOLN
10.0000 meq | INTRAVENOUS | Status: AC
  Filled 2020-07-24: qty 100

## 2020-07-24 MED ORDER — DEXTROSE 50 % IV SOLN: 0.0000 mL | INTRAVENOUS | Status: DC | PRN

## 2020-07-24 MED ORDER — ENOXAPARIN SODIUM 40 MG/0.4ML ~~LOC~~ SOLN
40.0000 mg | SUBCUTANEOUS | Status: DC
  Administered 2020-07-24 – 2020-08-01 (×9): 40 mg via SUBCUTANEOUS
  Filled 2020-07-24 (×9): qty 0.4

## 2020-07-24 MED ORDER — INSULIN ASPART 100 UNIT/ML ~~LOC~~ SOLN
4.0000 [IU] | Freq: Three times a day (TID) | SUBCUTANEOUS | Status: DC
  Administered 2020-07-25 – 2020-08-02 (×24): 4 [IU] via SUBCUTANEOUS
  Filled 2020-07-24 (×24): qty 1

## 2020-07-24 MED ORDER — LACTATED RINGERS IV SOLN: INTRAVENOUS | Status: DC

## 2020-07-24 MED ORDER — INSULIN REGULAR(HUMAN) IN NACL 100-0.9 UT/100ML-% IV SOLN
INTRAVENOUS | Status: DC
  Administered 2020-07-24: 11.5 [IU]/h via INTRAVENOUS
  Filled 2020-07-24 (×2): qty 100

## 2020-07-24 MED ORDER — INSULIN REGULAR(HUMAN) IN NACL 100-0.9 UT/100ML-% IV SOLN: INTRAVENOUS | Status: DC

## 2020-07-24 MED ORDER — ROSUVASTATIN CALCIUM 20 MG PO TABS
40.0000 mg | ORAL_TABLET | Freq: Every day | ORAL | Status: DC
  Administered 2020-07-24 – 2020-08-01 (×9): 40 mg via ORAL
  Filled 2020-07-24: qty 4
  Filled 2020-07-24 (×9): qty 2

## 2020-07-24 MED ORDER — LURASIDONE HCL 40 MG PO TABS
20.0000 mg | ORAL_TABLET | Freq: Every day | ORAL | Status: DC
Start: 2020-07-24 — End: 2020-08-02
  Administered 2020-07-24 – 2020-08-02 (×10): 20 mg via ORAL
  Filled 2020-07-24 (×10): qty 1

## 2020-07-24 MED ORDER — CLONAZEPAM 1 MG PO TABS
1.0000 mg | ORAL_TABLET | Freq: Two times a day (BID) | ORAL | Status: DC
  Administered 2020-07-24 – 2020-08-02 (×17): 1 mg via ORAL
  Filled 2020-07-24 (×12): qty 1
  Filled 2020-07-24: qty 2
  Filled 2020-07-24 (×4): qty 1

## 2020-07-24 MED ORDER — SODIUM CHLORIDE 0.9 % IV BOLUS
500.0000 mL | Freq: Once | INTRAVENOUS | Status: AC
  Administered 2020-07-24: 500 mL via INTRAVENOUS

## 2020-07-24 MED ORDER — LORATADINE 10 MG PO TABS
10.0000 mg | ORAL_TABLET | Freq: Every day | ORAL | Status: DC
  Administered 2020-07-24 – 2020-08-02 (×10): 10 mg via ORAL
  Filled 2020-07-24 (×10): qty 1

## 2020-07-24 MED ORDER — INSULIN GLARGINE 100 UNIT/ML ~~LOC~~ SOLN
10.0000 [IU] | SUBCUTANEOUS | Status: DC
  Administered 2020-07-24 – 2020-07-25 (×2): 10 [IU] via SUBCUTANEOUS
  Filled 2020-07-24 (×3): qty 0.1

## 2020-07-24 MED ORDER — INSULIN ASPART 100 UNIT/ML ~~LOC~~ SOLN
0.0000 [IU] | Freq: Three times a day (TID) | SUBCUTANEOUS | Status: DC
  Administered 2020-07-25: 8 [IU] via SUBCUTANEOUS
  Administered 2020-07-25 (×2): 5 [IU] via SUBCUTANEOUS
  Administered 2020-07-26: 17:00:00 3 [IU] via SUBCUTANEOUS
  Administered 2020-07-26: 13:00:00 5 [IU] via SUBCUTANEOUS
  Administered 2020-07-26: 09:00:00 3 [IU] via SUBCUTANEOUS
  Administered 2020-07-27: 16:00:00 5 [IU] via SUBCUTANEOUS
  Administered 2020-07-27: 8 [IU] via SUBCUTANEOUS
  Administered 2020-07-27: 3 [IU] via SUBCUTANEOUS
  Administered 2020-07-28: 13:00:00 5 [IU] via SUBCUTANEOUS
  Administered 2020-07-28 (×2): 3 [IU] via SUBCUTANEOUS
  Administered 2020-07-29: 2 [IU] via SUBCUTANEOUS
  Administered 2020-07-29: 3 [IU] via SUBCUTANEOUS
  Administered 2020-07-29: 5 [IU] via SUBCUTANEOUS
  Administered 2020-07-30 (×2): 2 [IU] via SUBCUTANEOUS
  Administered 2020-07-31: 3 [IU] via SUBCUTANEOUS
  Administered 2020-07-31 – 2020-08-01 (×4): 2 [IU] via SUBCUTANEOUS
  Administered 2020-08-02: 3 [IU] via SUBCUTANEOUS
  Filled 2020-07-24 (×23): qty 1

## 2020-07-24 MED ORDER — ORAL CARE MOUTH RINSE
15.0000 mL | Freq: Two times a day (BID) | OROMUCOSAL | Status: DC
  Administered 2020-07-25 – 2020-08-02 (×15): 15 mL via OROMUCOSAL

## 2020-07-24 MED ORDER — INSULIN ASPART 100 UNIT/ML ~~LOC~~ SOLN
0.0000 [IU] | Freq: Every day | SUBCUTANEOUS | Status: DC
  Administered 2020-07-25 – 2020-07-26 (×2): 2 [IU] via SUBCUTANEOUS
  Filled 2020-07-24 (×3): qty 1

## 2020-07-24 MED ORDER — FLUOXETINE HCL 20 MG PO CAPS
60.0000 mg | ORAL_CAPSULE | Freq: Every day | ORAL | Status: DC
  Administered 2020-07-24 – 2020-08-02 (×10): 60 mg via ORAL
  Filled 2020-07-24 (×10): qty 3

## 2020-07-24 MED ORDER — ENALAPRIL MALEATE 10 MG PO TABS
5.0000 mg | ORAL_TABLET | Freq: Two times a day (BID) | ORAL | Status: DC
  Administered 2020-07-24: 5 mg via ORAL
  Filled 2020-07-24 (×2): qty 0.5

## 2020-07-24 NOTE — Progress Notes (Signed)
Inpatient Diabetes Program Recommendations  AACE/ADA: New Consensus Statement on Inpatient Glycemic Control (2015)  Target Ranges:  Prepandial:   less than 140 mg/dL      Peak postprandial:   less than 180 mg/dL (1-2 hours)      Critically ill patients:  140 - 180 mg/dL   Results for MAITLAND, MUHLBAUER (MRN [id number]) as of 07/24/2020 11:52  Ref. Range 07/24/2020 07:25  Sodium Latest Ref Range: 135 - 145 mmol/L 139  Potassium Latest Ref Range: 3.5 - 5.1 mmol/L 4.8  Chloride Latest Ref Range: 98 - 111 mmol/L 103  CO2 Latest Ref Range: 22 - 32 mmol/L 17 (L)  Glucose Latest Ref Range: 70 - 99 mg/dL 448 (H)  BUN Latest Ref Range: 8 - 23 mg/dL 42 (H)  Creatinine Latest Ref Range: 0.44 - 1.00 mg/dL 1.48 (H)  Calcium Latest Ref Range: 8.9 - 10.3 mg/dL 9.3  Anion gap Latest Ref Range: 5 - 15  19 (H)   Results for RAENA, PAU (MRN [id number]) as of 07/24/2020 11:52  Ref. Range 07/24/2020 10:02 07/24/2020 11:44  Glucose-Capillary Latest Ref Range: 70 - 99 mg/dL 412 (H) 416 (H)     To ED from ALF with Weakness, AMS and possible syncope  History: DM2   Home DM Meds: Glipizide 10 mg Daily          Tradjenta 5 mg BID     Lab glucose 448 7:30am today (AG 19 and CO2 level 17)--Pt given 1L NS  Note Orders to start IV Insulin Drip today.  Will follow    --Will follow patient during hospitalization--  Wyn Quaker RN, MSN, CDE Diabetes Coordinator Inpatient Glycemic Control Team Team Pager: [phone] (8a-5p)

## 2020-07-24 NOTE — ED Notes (Signed)
Patient transported to CT 

## 2020-07-24 NOTE — Progress Notes (Addendum)
1340: Received patient from ER via stretcher. She's awake alert, oriented x 4. She's on room air. She has insulin drip infusing at 11.5 units/hr.   Patient hooked to the monitor. BS checked 454 and Insulin drip adjusted to 14 units/hr per Endotool.

## 2020-07-24 NOTE — ED Provider Notes (Signed)
[facility] Emergency Department Provider Note   ____________________________________________   Event Date/Time   First MD Initiated Contact with Patient 07/24/20 [phone]     (approximate)  I have reviewed the triage vital signs and the nursing notes.   HISTORY  Chief Complaint Weakness, Loss of Consciousness, and Altered Mental Status    HPI Delmar Belle Charlie is a 75 y.o. female history diabetes hypertension recent falls, bipolar disorder  Patient presents today reports that she was on her couch last night, she thinks last night or maybe it was a day or 2 ago.  But then she woke up on the floor and was too weak unable to get herself up.  She reports that she has been down on the floor for fair amount of time possibly a day or 2 on the ground.  Finally able to reach a call cord that activated EMS today  Patient reports that she has been rolling from side to side because of [place] sore 6 had to sleep on it had to urinate and even defecate on the floor unable to get herself up fully.  Denies weakness in any 1 arm or leg but just feels generally weak.   No pain or discomfort urination.  Slightly sore over the back of the head where she thinks she may have hit her head or laid on the floor  No neck pain.  Past Medical History:  Diagnosis Date  . Arthritis   . Breast cancer ([place])   . Cancer [facility]) 2004   right breast ca  . Diabetes mellitus without complication ([place])   . Hypercholesterolemia   . Hypertension   . Hypothyroid   . Seasonal allergies     Patient Active Problem List   Diagnosis Date Noted  . Bipolar disorder current episode depressed ([place]) 01/07/2020  . MDD (major depressive disorder) 08/10/2019  . MDD (major depressive disorder), recurrent episode, severe ([place]) 06/24/2019  . Pain due to onychomycosis of toenails of both feet 11/23/2018  . Bipolar 1 disorder, manic, moderate ([place]) 07/21/2018  . Bipolar 1 disorder, mixed, severe  ([place]) 07/21/2018  . Bipolar 1 disorder with moderate mania ([place]) 07/20/2018  . Severe bipolar I disorder, current or most recent episode depressed ([place]) 07/07/2017  . PTSD (post-traumatic stress disorder) 07/07/2017  . 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 Diabetes ([place]) 09/23/2014  . Essential hypertension 09/21/2014  . Hypothyroidism 09/21/2014  . Dyslipidemia 09/21/2014  . Bipolar I disorder, current or most recent episode manic, severe with mixed features ([place]) 09/20/2014    Past Surgical History:  Procedure Laterality Date  . ABDOMINAL HYSTERECTOMY    . BREAST BIOPSY Left    neg  . BREAST EXCISIONAL BIOPSY Right 2004   breast ca and lumpectomy  . BREAST LUMPECTOMY Right 2004   breast ca  . CHOLECYSTECTOMY    . COLONOSCOPY WITH PROPOFOL N/A 02/13/2017   Procedure: COLONOSCOPY WITH PROPOFOL;  Surgeon: Jonathon Bellows, MD;  Location: [facility] ENDOSCOPY;  Service: Gastroenterology;  Laterality: N/A;  . KIDNEY STONE SURGERY      Prior to Admission medications   Medication Sig Start Date End Date Taking? Authorizing Provider  clonazePAM (KLONOPIN) 1 MG tablet Take 1 tablet (1 mg total) by mouth 2 (two) times daily. 01/11/20   Clapacs, Madie Reno, MD  divalproex (DEPAKOTE ER) 500 MG 24 hr tablet Take 2 tablets (1,000 mg total) by mouth at bedtime. 01/11/20   Clapacs, Madie Reno, MD  enalapril (  VASOTEC) 5 MG tablet Take 1 tablet (5 mg total) by mouth 2 (two) times daily. 01/11/20   Clapacs, Madie Reno, MD  FLUoxetine (PROZAC) 20 MG capsule Take 60 mg by mouth daily.    [provider]  glipiZIDE (GLUCOTROL XL) 10 MG 24 hr tablet Take 1 tablet (10 mg total) by mouth daily with breakfast. 01/12/20   Clapacs, Madie Reno, MD  LATUDA 40 MG TABS tablet Take 20 mg by mouth daily.  04/04/20   [provider]  levothyroxine (SYNTHROID) 88 MCG tablet Take 88 mcg by mouth daily before breakfast.    [provider]  linagliptin (TRADJENTA) 5 MG TABS  tablet Take 5 mg by mouth 2 (two) times daily.     [provider]  loratadine (CLARITIN) 10 MG tablet Take 1 tablet (10 mg total) by mouth daily. 01/11/20   Clapacs, Madie Reno, MD  rosuvastatin (CRESTOR) 40 MG tablet Take 1 tablet (40 mg total) by mouth daily. 01/11/20   Clapacs, Madie Reno, MD    Allergies Navane [thiothixene] and Penicillins  Family History  Problem Relation Age of Onset  . Bladder Cancer Neg Hx   . Kidney cancer Neg Hx     Social History Social History   Tobacco Use  . Smoking status: Former Smoker    Types: Cigarettes  . Smokeless tobacco: Never Used  Vaping Use  . Vaping Use: Never used  Substance Use Topics  . Alcohol use: No  . Drug use: No    Review of Systems Constitutional: No fever/chills Eyes: No visual changes. ENT: No sore throat.  Very thirsty. Cardiovascular: Denies chest pain. Respiratory: Denies shortness of breath. Gastrointestinal: No abdominal pain.   Genitourinary: Negative for dysuria.  And to urinate herself that she was stuck on the floor Musculoskeletal: Negative for back pain. Skin: Negative for rash. Neurological: Negative for headaches, areas of focal weakness or numbness.    ____________________________________________   PHYSICAL EXAM:  VITAL SIGNS: ED Triage Vitals  Enc Vitals Group     BP 07/24/20 0717 (!) 106/47     Pulse Rate 07/24/20 0717 83     Resp 07/24/20 0717 20     Temp 07/24/20 0720 97.9 F (36.6 C)     Temp Source 07/24/20 0720 Oral     SpO2 07/24/20 0713 95 %     Weight --      Height --      Head Circumference --      Peak Flow --      Pain Score --      Pain Loc --      Pain Edu? --      Excl. in [place]? --     Constitutional: Alert and oriented. Well appearing and in no acute distress. Eyes: Conjunctivae are normal. Head: Atraumatic. Nose: No congestion/rhinnorhea. Mouth/Throat: Mucous membranes are dry. Neck: No stridor.  Cardiovascular: Normal rate, regular rhythm. Grossly normal  heart sounds.  Good peripheral circulation. Respiratory: Normal respiratory effort.  No retractions. Lungs CTAB. Gastrointestinal: Soft and nontender. No distention. Musculoskeletal: No lower extremity tenderness nor edema. Neurologic:  Normal speech and language. No gross focal neurologic deficits are appreciated.  Skin:  Skin is warm, dry and intact. No rash noted. Psychiatric: Mood and affect are normal. Speech and behavior are normal.  ____________________________________________   LABS (all labs ordered are listed, but only abnormal results are displayed)  Labs Reviewed  CBC WITH DIFFERENTIAL/PLATELET - Abnormal; Notable for the following components:  Result Value   RBC 5.54 (*)    Hemoglobin 16.2 (*)    HCT 50.0 (*)    Abs Immature Granulocytes 0.16 (*)    All other components within normal limits  BASIC METABOLIC PANEL - Abnormal; Notable for the following components:   CO2 17 (*)    Glucose, Bld 448 (*)    BUN 42 (*)    Creatinine, Ser 1.48 (*)    GFR, Estimated 37 (*)    Anion gap 19 (*)    All other components within normal limits  URINALYSIS, COMPLETE (UACMP) WITH MICROSCOPIC - Abnormal; Notable for the following components:   Color, Urine YELLOW (*)    APPearance CLEAR (*)    Glucose, UA >=500 (*)    Hgb urine dipstick SMALL (*)    Ketones, ur 80 (*)    Bacteria, UA RARE (*)    All other components within normal limits  LACTIC ACID, PLASMA - Abnormal; Notable for the following components:   Lactic Acid, Venous 2.0 (*)    All other components within normal limits  CBG MONITORING, ED - Abnormal; Notable for the following components:   Glucose-Capillary 412 (*)    All other components within normal limits  SARS CORONAVIRUS 2 (TAT 6-24 HRS)  CK  PROTIME-INR  BETA-HYDROXYBUTYRIC ACID  SALICYLATE LEVEL  TROPONIN I (HIGH SENSITIVITY)  TROPONIN I (HIGH SENSITIVITY)   ____________________________________________  EKG  Reviewed inter by me at 720 Heart  rate 85 QRS 99 QTc 470 Normal sinus rhythm, wound ST depression notable in inferior lateral distribution.  No STEMI.  T wave wave abnormalities are new compared to previous  ____________________________________________  RADIOLOGY  CT Head Wo Contrast  Result Date: 07/24/2020 CLINICAL DATA:  Facial trauma.  Fall with occipital swelling. EXAM: CT HEAD WITHOUT CONTRAST TECHNIQUE: Contiguous axial images were obtained from the base of the skull through the vertex without intravenous contrast. COMPARISON:  04/06/2020 FINDINGS: Brain: No evidence of acute infarction, hemorrhage, hydrocephalus, extra-axial collection or mass lesion/mass effect. Central predominant cerebral volume loss. Chronic small vessel ischemia in the deep white matter. Vascular: No hyperdense vessel or unexpected calcification. Skull: Normal. Negative for fracture or focal lesion. Sinuses/Orbits: No acute finding. IMPRESSION: No evidence of intracranial injury. Electronically Signed   By: Monte Fantasia M.D.   On: 07/24/2020 07:59   CT head negative for acute  ____________________________________________   PROCEDURES  Procedure(s) performed: None  Procedures  Critical Care performed: Yes, see critical care note(s)  CRITICAL CARE Performed by: Delman Kitten   Total critical care time: 30 minutes  Critical care time was exclusive of separately billable procedures and treating other patients.  Critical care was necessary to treat or prevent imminent or life-threatening deterioration.  Critical care was time spent personally by me on the following activities: development of treatment plan with patient and/or surrogate as well as nursing, discussions with consultants, evaluation of patient's response to treatment, examination of patient, obtaining history from patient or surrogate, ordering and performing treatments and interventions, ordering and review of laboratory studies, ordering and review of radiographic studies,  pulse oximetry and re-evaluation of patient's condition.  ____________________________________________   INITIAL IMPRESSION / ASSESSMENT AND PLAN / ED COURSE  Pertinent labs & imaging results that were available during my care of the patient were reviewed by me and considered in my medical decision making (see chart for details).   No evidence of cervical injury.  Nexus negative.  CT head negative for acute injury.  Patient appears to  been down for an extended period, exact timeframe unknown.  Do not suspect she has any obvious injuries from it, no pressure sores etc.  I am concerned though that she repeatedly reports that she was not feeling well feeling fatigued preceding this, and will evaluate with broad work-up.  Clinical Course as of 07/24/20 1134  Mon Jul 24, 2020  0942 Anion gap(!): 19 [MQ]  0942 Glucose(!): 448 [MQ]  0942 CO2(!): 17 Elevated anion gap with hyperglycemia and low CO2.  Suspicious for possible diabetic ketoacidosis or other metabolic acidosis.  Awaiting further work-up.  Patient receiving fluids at this time for rehydration [MQ]  0956 Patient resting at this time.  Labs hemolyzed, being resent now.  Rechecking glucose.  Have a suspicion this may represent DKA or starvation type ketosis given she was down on the ground for some time, but uncertain as to this time [MQ]    Clinical Course User Index [MQ] Delman Kitten, MD   ----------------------------------------- 11:35 AM on 07/24/2020 -----------------------------------------  At this point, with review of her urinalysis that demonstrates significant ketones I suspect patient is likely suffering from DKA given her diabetes status and hyperglycemia.  Will admit the patient for further care management, contacting hospitalist service now. ____________________________________________   FINAL CLINICAL IMPRESSION(S) / ED DIAGNOSES  Final diagnoses:  Generalized weakness  EKG abnormalities  Syncope and collapse         Note:  This document was prepared using Dragon voice recognition software and may include unintentional dictation errors       Delman Kitten, MD 07/24/20 2153

## 2020-07-24 NOTE — ED Notes (Signed)
Informed RN bed assigned 1156

## 2020-07-24 NOTE — ED Notes (Signed)
purewick applied.

## 2020-07-24 NOTE — Progress Notes (Signed)
Admission documents done. Patient got both 2 shots of Covid vaccine, flu and not sure about the PNA shot.   Patient asked if she wants anyone notified about her admission. She said she doesn't have anyone. "Kids are out of town in [place] and [place] and my siblings don't care and they don't have a relationship".

## 2020-07-24 NOTE — ED Triage Notes (Signed)
Pt in w/weakness, AMS and possible syncope. Pt states she remembers sitting on the couch "a few days ago", felt weak, and thinks she passed out. Unknown amount of time being down, but EMS states she hit her Lifealert, and was found prone on the floor. GCS 14 on arrival, denies any pain. EMS states she was ambulatory on scene. Lives at [place] alone.

## 2020-07-24 NOTE — ED Notes (Signed)
Pt unable to give urine sample at this time, another diet sprite provided. IVF still infusing

## 2020-07-24 NOTE — ED Notes (Signed)
Pt has had 3 glasses of water

## 2020-07-24 NOTE — H&P (Signed)
History and Physical    [name] DXI:[id number] DOB: February 06, 1946 DOA: 07/24/2020  PCP: Mechele Claude, FNP   Patient coming from: Home ([facility])  I have personally briefly reviewed patient's old medical records in [place]  Chief Complaint: Weakness  HPI: [name] is a 75 y.o. female with medical history significant for diabetes mellitus on oral hypoglycemic agents, hypertension, hypothyroidism, history of breast cancer and bipolar disorder who presents to the ER via EMS for evaluation of weakness.  Patient is a poor historian but states that she thinks she had been on the floor of her apartment for about 1 to 2 days.  According to the patient she was very weak and thinks she may have passed out but she is not sure.  She had been laying on the floor and had difficulty getting to her life alert.  According to the patient she was able to get to the life alert this morning and called out for help.  Per EMS she was found prone on the floor and in no distress.  Her GCS was 14 upon EMS arrival. She denies having any pain, she denies feeling dizzy or lightheaded, she has no abdominal pain, no nausea, no vomiting, no changes in her bowel habits, no fever or chills, she denies having any headaches, no joint aches or pains, no blurred vision, no palpitations or diaphoresis. She states that she has not had anything to eat in days and has not taken any of her medications either. Labs show sodium 139, potassium 4.8, chloride 103, bicarb 17, glucose 448, BUN 42, creatinine 1.48, calcium 9.3, anion gap 19, total CK 127, lactic acid 2.0, white count 8.1, hemoglobin 16.2, hematocrit 50.0, MCV 90.3, RDW 12.6, platelet count 186, PT 76.7, INR 1.1, salicylate level less than 7 CT scan of the head without contrast shows no acute abnormality.  ED Course: Patient is a 75 year old female who was brought to the ER by EMS for evaluation of weakness and found to  be in DKA.  Patient was started on an insulin drip and will be admitted to the hospital for further evaluation.  Review of Systems: As per HPI otherwise all other systems reviewed and negative.    Past Medical History:  Diagnosis Date  . Arthritis   . Breast cancer ([facility] [facility])   . Cancer [facility]) 2004   right breast ca  . Diabetes mellitus without complication ([place])   . Hypercholesterolemia   . Hypertension   . Hypothyroid   . Seasonal allergies     Past Surgical History:  Procedure Laterality Date  . ABDOMINAL HYSTERECTOMY    . BREAST BIOPSY Left    neg  . BREAST EXCISIONAL BIOPSY Right 2004   breast ca and lumpectomy  . BREAST LUMPECTOMY Right 2004   breast ca  . CHOLECYSTECTOMY    . COLONOSCOPY WITH PROPOFOL N/A 02/13/2017   Procedure: COLONOSCOPY WITH PROPOFOL;  Surgeon: Jonathon Bellows, MD;  Location: [facility] ENDOSCOPY;  Service: Gastroenterology;  Laterality: N/A;  . KIDNEY STONE SURGERY       reports that she has quit smoking. Her smoking use included cigarettes. She has never used smokeless tobacco. She reports that she does not drink alcohol and does not use drugs.  Allergies  Allergen Reactions  . Navane [Thiothixene] Other (See Comments)    Reaction:  Unknown  . Penicillins Rash    Has patient had a PCN reaction causing immediate rash, facial/tongue/throat swelling, SOB or lightheadedness with hypotension:  No Has patient had a PCN reaction causing severe rash involving mucus membranes or skin necrosis: No Has patient had a PCN reaction that required hospitalization: No Has patient had a PCN reaction occurring within the last 10 years: No If all of the above answers are "NO", then may proceed with Cephalosporin use.     Family History  Problem Relation Age of Onset  . Bladder Cancer Neg Hx   . Kidney cancer Neg Hx       Prior to Admission medications   Medication Sig Start Date End Date Taking? Authorizing Provider  simvastatin (ZOCOR) 20 MG tablet Take 1 tablet by  mouth daily.   Yes [provider]  clonazePAM (KLONOPIN) 1 MG tablet Take 1 tablet (1 mg total) by mouth 2 (two) times daily. 01/11/20   Clapacs, Madie Reno, MD  divalproex (DEPAKOTE ER) 500 MG 24 hr tablet Take 2 tablets (1,000 mg total) by mouth at bedtime. 01/11/20   Clapacs, Madie Reno, MD  enalapril (VASOTEC) 5 MG tablet Take 1 tablet (5 mg total) by mouth 2 (two) times daily. 01/11/20   Clapacs, Madie Reno, MD  FLUoxetine (PROZAC) 20 MG capsule Take 60 mg by mouth daily.    [provider]  glipiZIDE (GLUCOTROL XL) 10 MG 24 hr tablet Take 1 tablet (10 mg total) by mouth daily with breakfast. 01/12/20   Clapacs, Madie Reno, MD  LATUDA 40 MG TABS tablet Take 20 mg by mouth daily.  04/04/20   [provider]  levothyroxine (SYNTHROID) 88 MCG tablet Take 88 mcg by mouth daily before breakfast.    [provider]  linagliptin (TRADJENTA) 5 MG TABS tablet Take 5 mg by mouth 2 (two) times daily.     [provider]  loratadine (CLARITIN) 10 MG tablet Take 1 tablet (10 mg total) by mouth daily. 01/11/20   Clapacs, Madie Reno, MD  rosuvastatin (CRESTOR) 40 MG tablet Take 1 tablet (40 mg total) by mouth daily. 01/11/20   Clapacs, Madie Reno, MD    Physical Exam: Vitals:   07/24/20 1100 07/24/20 1130 07/24/20 1200 07/24/20 1245  BP: 122/76 131/71 135/67   Pulse:    72  Resp: 16 18 16 20   Temp:      TempSrc:      SpO2:    96%     Vitals:   07/24/20 1100 07/24/20 1130 07/24/20 1200 07/24/20 1245  BP: 122/76 131/71 135/67   Pulse:    72  Resp: 16 18 16 20   Temp:      TempSrc:      SpO2:    96%      Constitutional: Alert and oriented x 3. Not in any apparent distress HEENT:      Head: Normocephalic and atraumatic.         Eyes: PERLA, EOMI, Conjunctivae are normal. Sclera is non-icteric.       Mouth/Throat:  Dry mucous membranes .       Neck: Supple with no signs of meningismus. Cardiovascular: Regular rate and rhythm. No murmurs, gallops, or rubs. 2+ symmetrical  distal pulses are present . No JVD. No LE edema Respiratory: Respiratory effort normal .Lungs sounds clear bilaterally. No wheezes, crackles, or rhonchi.  Gastrointestinal: Soft, non tender, and non distended with positive bowel sounds.  Genitourinary: No CVA tenderness. Musculoskeletal: Nontender with normal range of motion in all extremities. No cyanosis, or erythema of extremities. Neurologic:  Face is symmetric. Moving all extremities. No gross focal neurologic deficits .  Skin: Skin is warm, dry.  No rash or ulcers Psychiatric: Mood and affect are normal   Labs on Admission: I have personally reviewed following labs and imaging studies  CBC: Recent Labs  Lab 07/24/20 0725  WBC 8.1  NEUTROABS 6.0  HGB 16.2*  HCT 50.0*  MCV 90.3  PLT 448   Basic Metabolic Panel: Recent Labs  Lab 07/24/20 0725  NA 139  K 4.8  CL 103  CO2 17*  GLUCOSE 448*  BUN 42*  CREATININE 1.48*  CALCIUM 9.3   GFR: CrCl cannot be calculated (Unknown ideal weight.). Liver Function Tests: No results for input(s): AST, ALT, ALKPHOS, BILITOT, PROT, ALBUMIN in the last 168 hours. No results for input(s): LIPASE, AMYLASE in the last 168 hours. No results for input(s): AMMONIA in the last 168 hours. Coagulation Profile: Recent Labs  Lab 07/24/20 0735  INR 1.1   Cardiac Enzymes: Recent Labs  Lab 07/24/20 0725  CKTOTAL 127   BNP (last 3 results) No results for input(s): PROBNP in the last 8760 hours. HbA1C: No results for input(s): HGBA1C in the last 72 hours. CBG: Recent Labs  Lab 07/24/20 1002 07/24/20 1144  GLUCAP 412* 416*   Lipid Profile: No results for input(s): CHOL, HDL, LDLCALC, TRIG, CHOLHDL, LDLDIRECT in the last 72 hours. Thyroid Function Tests: No results for input(s): TSH, T4TOTAL, FREET4, T3FREE, THYROIDAB in the last 72 hours. Anemia Panel: No results for input(s): VITAMINB12, FOLATE, FERRITIN, TIBC, IRON, RETICCTPCT in the last 72 hours. Urine analysis:    Component  Value Date/Time   COLORURINE YELLOW (A) 07/24/2020 1041   APPEARANCEUR CLEAR (A) 07/24/2020 1041   APPEARANCEUR Clear 02/24/2017 1050   LABSPEC 1.027 07/24/2020 1041   LABSPEC 1.018 02/25/2014 0927   PHURINE 5.0 07/24/2020 1041   GLUCOSEU >=500 (A) 07/24/2020 1041   GLUCOSEU Negative 02/25/2014 0927   HGBUR SMALL (A) 07/24/2020 1041   BILIRUBINUR NEGATIVE 07/24/2020 1041   BILIRUBINUR Negative 02/24/2017 1050   BILIRUBINUR Negative 02/25/2014 0927   KETONESUR 80 (A) 07/24/2020 1041   PROTEINUR NEGATIVE 07/24/2020 1041   NITRITE NEGATIVE 07/24/2020 [place] 07/24/2020 1041   LEUKOCYTESUR 3+ 02/25/2014 0927    Radiological Exams on Admission: CT Head Wo Contrast  Result Date: 07/24/2020 CLINICAL DATA:  Facial trauma.  Fall with occipital swelling. EXAM: CT HEAD WITHOUT CONTRAST TECHNIQUE: Contiguous axial images were obtained from the base of the skull through the vertex without intravenous contrast. COMPARISON:  04/06/2020 FINDINGS: Brain: No evidence of acute infarction, hemorrhage, hydrocephalus, extra-axial collection or mass lesion/mass effect. Central predominant cerebral volume loss. Chronic small vessel ischemia in the deep white matter. Vascular: No hyperdense vessel or unexpected calcification. Skull: Normal. Negative for fracture or focal lesion. Sinuses/Orbits: No acute finding. IMPRESSION: No evidence of intracranial injury. Electronically Signed   By: Monte Fantasia M.D.   On: 07/24/2020 07:59     Assessment/Plan Principal Problem:   DKA (diabetic ketoacidosis) ([place]) Active Problems:   Essential hypertension   Hypothyroidism   Severe bipolar I disorder, current or most recent episode depressed ([place])    DKA Secondary to medication noncompliance Aggressive IV fluid resuscitation Place patient on insulin drip per protocol Check and supplement electrolytes every 6 hours We will request diabetic education    Hypertension Continue  enalapril   Hypothyroidism Continue Synthroid   Severe bipolar disorder Continue Latuda, fluoxetine and Depakote  DVT prophylaxis: Lovenox Code Status: full code Family Communication: Greater than 50% of time was spent discussing patient's condition  and plan of care with her at the bedside.  All questions and concerns have been addressed.  She verbalizes understanding and agrees with the plan. Disposition Plan: Back to previous home environment Consults called: None Status: At the time of admission, it appears that the appropriate admission status for this patient is inpatient.  This is judged to be reasonable and necessary in order to provide the required intensity of service to ensure the patient's safety given the presenting symptoms, physical exam findings, and initial radiographic and laboratory data in the context of their comorbid conditions. Patient requires inpatient status due to high intensity of service, high risk for further deterioration and high frequency of surveillance.    Collier Bullock MD [facility]     07/24/2020, 2:03 PM

## 2020-07-25 DIAGNOSIS — I10 Essential (primary) hypertension: Secondary | ICD-10-CM | POA: Diagnosis not present

## 2020-07-25 DIAGNOSIS — E1110 Type 2 diabetes mellitus with ketoacidosis without coma: Secondary | ICD-10-CM | POA: Diagnosis not present

## 2020-07-25 DIAGNOSIS — F314 Bipolar disorder, current episode depressed, severe, without psychotic features: Secondary | ICD-10-CM | POA: Diagnosis not present

## 2020-07-25 DIAGNOSIS — E039 Hypothyroidism, unspecified: Secondary | ICD-10-CM | POA: Diagnosis not present

## 2020-07-25 LAB — GLUCOSE, CAPILLARY
Glucose-Capillary: 110 mg/dL — ABNORMAL HIGH (ref 70–99)
Glucose-Capillary: 114 mg/dL — ABNORMAL HIGH (ref 70–99)
Glucose-Capillary: 120 mg/dL — ABNORMAL HIGH (ref 70–99)
Glucose-Capillary: 167 mg/dL — ABNORMAL HIGH (ref 70–99)
Glucose-Capillary: 213 mg/dL — ABNORMAL HIGH (ref 70–99)
Glucose-Capillary: 220 mg/dL — ABNORMAL HIGH (ref 70–99)
Glucose-Capillary: 240 mg/dL — ABNORMAL HIGH (ref 70–99)
Glucose-Capillary: 281 mg/dL — ABNORMAL HIGH (ref 70–99)

## 2020-07-25 LAB — BASIC METABOLIC PANEL
Anion gap: 9 (ref 5–15)
BUN: 22 mg/dL (ref 8–23)
CO2: 21 mmol/L — ABNORMAL LOW (ref 22–32)
Calcium: 8.4 mg/dL — ABNORMAL LOW (ref 8.9–10.3)
Chloride: 107 mmol/L (ref 98–111)
Creatinine, Ser: 0.93 mg/dL (ref 0.44–1.00)
GFR, Estimated: >60 mL/min (ref >60)
Glucose, Bld: 190 mg/dL — ABNORMAL HIGH (ref 70–99)
Potassium: 4 mmol/L (ref 3.5–5.1)
Sodium: 137 mmol/L (ref 135–145)

## 2020-07-25 MED ORDER — INSULIN STARTER KIT- PEN NEEDLES (ENGLISH)
1.0000 | Freq: Once | Status: AC
  Administered 2020-07-25: 1
  Filled 2020-07-25: qty 1

## 2020-07-25 MED ORDER — ACETAMINOPHEN 325 MG PO TABS
650.0000 mg | ORAL_TABLET | Freq: Four times a day (QID) | ORAL | Status: DC | PRN
  Administered 2020-07-25: 650 mg via ORAL
  Filled 2020-07-25: qty 2

## 2020-07-25 NOTE — Progress Notes (Signed)
Transferred to 1C on tele, by bed with Chasity, NT and Renee, NT.  Patient alert with no distress noted when leaving ICU. Report called to Regino Schultze, RN prior to transfer.

## 2020-07-25 NOTE — Progress Notes (Addendum)
PROGRESS NOTE    [name]  XKG:[id number] DOB: 01-15-1946 DOA: 07/24/2020 PCP: Mechele Claude, FNP    Brief Narrative:  [name] is a 75 year old female who was brought to the ER by EMS for evaluation of weakness and found to be in DKA.  Patient was started on an insulin drip and will be admitted to the hospital for further evaluation.  Consultants:     Procedures:   Antimicrobials:       Subjective: Feels better than yesterday. No shortness of breath, chest pain or dizziness or abdominal pain  Objective: Vitals:   07/25/20 0400 07/25/20 0500 07/25/20 0600 07/25/20 0700  BP: (!) 93/55 (!) 102/58 99/69 (!) 113/58  Pulse: 62 61 63 62  Resp: _0 Temp: 97.8 F (36.6 C)     TempSrc: Oral     SpO2: 91% 91% 92% 92%  Weight:      Height:        Intake/Output Summary (Last 24 hours) at 07/25/2020 0832 Last data filed at 07/25/2020 0700 Gross per 24 hour  Intake 4457.36 ml  Output 2050 ml  Net 2407.36 ml   Filed Weights   07/24/20 1400  Weight: 78.6 kg    Examination:  General exam: Appears calm and comfortable  Respiratory system: Clear to auscultation. Respiratory effort normal. Cardiovascular system: S1 & S2 heard, RRR. No JVD, murmurs, rubs, gallops or clicks.  Gastrointestinal system: Abdomen is nondistended, soft and nontender. Normal bowel sounds heard. Central nervous system: Alert and oriented.  Grossly intact Extremities: No edema Skin: Warm dry Psychiatry: Judgement and insight appear normal. Mood & affect appropriate.     Data Reviewed: I have personally reviewed following labs and imaging studies  CBC: Recent Labs  Lab 07/24/20 0725  WBC 8.1  NEUTROABS 6.0  HGB 16.2*  HCT 50.0*  MCV 90.3  PLT 702   Basic Metabolic Panel: Recent Labs  Lab 07/24/20 0725 07/24/20 1350 07/24/20 1754 07/24/20 2127 07/25/20 0433  NA 139 135 136 134* 137  K 4.8 3.8 3.8 4.0 4.0  CL 103 104 107 105 107  CO2 17* 18* 20*  21* 21*  GLUCOSE 448* 372* 150* 286* 190*  BUN 42* 36* 31* 27* 22  CREATININE 1.48* 1.13* 1.00 0.93 0.93  CALCIUM 9.3 8.6* 8.5* 8.4* 8.4*   GFR: Estimated Creatinine Clearance: 50.7 mL/min (by C-G formula based on SCr of 0.93 mg/dL). Liver Function Tests: No results for input(s): AST, ALT, ALKPHOS, BILITOT, PROT, ALBUMIN in the last 168 hours. No results for input(s): LIPASE, AMYLASE in the last 168 hours. No results for input(s): AMMONIA in the last 168 hours. Coagulation Profile: Recent Labs  Lab 07/24/20 0735  INR 1.1   Cardiac Enzymes: Recent Labs  Lab 07/24/20 0725  CKTOTAL 127   BNP (last 3 results) No results for input(s): PROBNP in the last 8760 hours. HbA1C: Recent Labs    07/24/20 1350  HGBA1C 14.2*   CBG: Recent Labs  Lab 07/25/20 0007 07/25/20 0058 07/25/20 0134 07/25/20 0340 07/25/20 0728  GLUCAP 110* 114* 120* 167* 213*   Lipid Profile: No results for input(s): CHOL, HDL, LDLCALC, TRIG, CHOLHDL, LDLDIRECT in the last 72 hours. Thyroid Function Tests: No results for input(s): TSH, T4TOTAL, FREET4, T3FREE, THYROIDAB in the last 72 hours. Anemia Panel: No results for input(s): VITAMINB12, FOLATE, FERRITIN, TIBC, IRON, RETICCTPCT in the last 72 hours. Sepsis Labs: Recent Labs  Lab 07/24/20 0911  LATICACIDVEN 2.0*    Recent  Results (from the past 240 hour(s))  SARS CORONAVIRUS 2 (TAT 6-24 HRS) Nasopharyngeal Nasopharyngeal Swab     Status: None   Collection Time: 07/24/20 12:24 PM   Specimen: Nasopharyngeal Swab  Result Value Ref Range Status   SARS Coronavirus 2 NEGATIVE NEGATIVE Final    Comment: (NOTE) SARS-CoV-2 target nucleic acids are NOT DETECTED.  The SARS-CoV-2 RNA is generally detectable in upper and lower respiratory specimens during the acute phase of infection. Negative results do not preclude SARS-CoV-2 infection, do not rule out co-infections with other pathogens, and should not be used as the sole basis for treatment or  other patient management decisions. Negative results must be combined with clinical observations, patient history, and epidemiological information. The expected result is Negative.  Fact Sheet for Patients: SugarRoll.be  Fact Sheet for Healthcare Providers: https://www.woods-mathews.com/  This test is not yet approved or cleared by the [place] FDA and  has been authorized for detection and/or diagnosis of SARS-CoV-2 by FDA under an Emergency Use Authorization (EUA). This EUA will remain  in effect (meaning this test can be used) for the duration of the COVID-19 declaration under Se ction 564(b)(1) of the Act, 21 U.S.C. section 360bbb-3(b)(1), unless the authorization is terminated or revoked sooner.  Performed at [facility], [place] [address] [place], [place] [address]   MRSA PCR Screening     Status: None   Collection Time: 07/24/20  2:30 PM   Specimen: Nasal Mucosa; Nasopharyngeal  Result Value Ref Range Status   MRSA by PCR NEGATIVE NEGATIVE Final    Comment:        The GeneXpert MRSA Assay (FDA approved for NASAL specimens only), is one component of a comprehensive MRSA colonization surveillance program. It is not intended to diagnose MRSA infection nor to guide or monitor treatment for MRSA infections. Performed at [facility], [address], [place], [place] [address]          Radiology Studies: CT Head Wo Contrast  Result Date: 07/24/2020 CLINICAL DATA:  Facial trauma.  Fall with occipital swelling. EXAM: CT HEAD WITHOUT CONTRAST TECHNIQUE: Contiguous axial images were obtained from the base of the skull through the vertex without intravenous contrast. COMPARISON:  04/06/2020 FINDINGS: Brain: No evidence of acute infarction, hemorrhage, hydrocephalus, extra-axial collection or mass lesion/mass effect. Central predominant cerebral volume loss. Chronic small vessel ischemia in the deep white matter.  Vascular: No hyperdense vessel or unexpected calcification. Skull: Normal. Negative for fracture or focal lesion. Sinuses/Orbits: No acute finding. IMPRESSION: No evidence of intracranial injury. Electronically Signed   By: Monte Fantasia M.D.   On: 07/24/2020 07:59        Scheduled Meds: . Chlorhexidine Gluconate Cloth  6 each Topical Daily  . clonazePAM  1 mg Oral BID  . divalproex  1,000 mg Oral QHS  . enalapril  5 mg Oral BID  . enoxaparin (LOVENOX) injection  40 mg Subcutaneous Q24H  . FLUoxetine  60 mg Oral Daily  . insulin aspart  0-15 Units Subcutaneous TID WC  . insulin aspart  0-5 Units Subcutaneous QHS  . insulin aspart  4 Units Subcutaneous TID WC  . insulin glargine  10 Units Subcutaneous Q24H  . insulin starter kit- pen needles  1 kit Other Once  . levothyroxine  88 mcg Oral QAC breakfast  . loratadine  10 mg Oral Daily  . lurasidone  20 mg Oral Daily  . mouth rinse  15 mL Mouth Rinse BID  . rosuvastatin  40 mg Oral QHS   Continuous Infusions: . dextrose 5% lactated ringers    . dextrose 5% lactated ringers Stopped (07/24/20 2226)  . lactated ringers Stopped (07/24/20 1634)  . lactated ringers 75 mL/hr at 07/25/20 0700    Assessment & Plan:   Principal Problem:   DKA (diabetic ketoacidosis) ([place]) Active Problems:   Essential hypertension   Hypothyroidism   Severe bipolar I disorder, current or most recent episode depressed ([place])  DKA Secondary to medication noncompliance Was started on aggressive IV fluid Placed on insulin drip per protocol ...> Discontinued earlier this morning Taking p.o. intake Diabetic educator consulted A1c 14.2 Patient likely will need insulin added to home diabetes med regimen Began insulin education Started on Lantus 10 units every 24 hours NovoLog 4 units 3 times daily with meals R-ISS   Hypertension This a.m. BP low will hold home ACE inhibitor   Hypothyroidism Continue Synthroid   Severe bipolar  disorder Continue Latuda, fluoxetine, Depakote   DVT prophylaxis: Lovenox Code Status: Full Family Communication: None at bedside  Status is: Inpatient  Remains inpatient appropriate because:Inpatient level of care appropriate due to severity of illness   Dispo: The patient is from: Home              Anticipated d/c is to: SNF              Patient currently is not medically stable to d/c.   Difficult to place patient No    anticipated discharge date 1 to 2 days.  Still on IV fluids due to low blood pressure.           LOS: 1 day   Time spent: 35 minutes    Nolberto Hanlon, MD [facility] Pager [phone]  If 7PM-7AM, please contact night-coverage 07/25/2020, 8:32 AM

## 2020-07-25 NOTE — Progress Notes (Signed)
Report called to Regino Schultze, RN on 1C.  Patient will be moved to room 116 when room is clean.

## 2020-07-25 NOTE — Progress Notes (Signed)
Dr. Kurtis Bushman present at bedside and gave order to transfer patient to med surg floor with tele.

## 2020-07-25 NOTE — Progress Notes (Signed)
Inpatient Diabetes Program Recommendations  AACE/ADA: New Consensus Statement on Inpatient Glycemic Control (2015)  Target Ranges:  Prepandial:   less than 140 mg/dL      Peak postprandial:   less than 180 mg/dL (1-2 hours)      Critically ill patients:  140 - 180 mg/dL   Results for TAILOR, LUCKING (MRN [id number]) as of 07/25/2020 06:58  Ref. Range 01/10/2020 15:12 07/24/2020 13:50  Hemoglobin A1C Latest Ref Range: 4.8 - 5.6 % 9.2 (H) 14.2 (H)  (360 mg/dl)   Results for NAOKO, DIPERNA (MRN [id number]) as of 07/25/2020 06:58  Ref. Range 07/24/2020 20:59 07/24/2020 21:56 07/24/2020 23:00 07/25/2020 00:07 07/25/2020 00:58 07/25/2020 01:34 07/25/2020 03:40  Glucose-Capillary Latest Ref Range: 70 - 99 mg/dL 289 (H)  IV Insulin Drip 268 (H)  IV Insulin Drip 186 (H)  IV Insulin Drip 110 (H)  IV Insulin Drip  10 units LANTUS @11 :24pm 114 (H)  IV Insulin Drip OFF 120 (H) 167 (H)   Admit with DKA  History: DM2   Home DM Meds: Glipizide 10 mg Daily                              Tradjenta 5 mg BID   Current Orders: Novolog Moderate Correction Scale/ SSI (0-15 units) TID AC + HS      Novolog 4 units TID with meals      Lantus 10 units Q24 hours   PCP listed as [name], FNP  Transitioned off the IV Insulin Drip to Lantus and Novolog last PM  Given Current A1c of 14.2%, expect pt may need Insulin added to home diabetes med regimen.  Pan to speak with pt and will ask nursing staff to begin insulin education.     --Will follow patient during hospitalization--  Wyn Quaker RN, MSN, CDE Diabetes Coordinator Inpatient Glycemic Control Team Team Pager: [phone] (8a-5p)

## 2020-07-25 NOTE — Evaluation (Signed)
Physical Therapy Evaluation Patient Details Name: [name] MRN: [id number] DOB: [birth year] Today's Date: 07/25/2020   History of Present Illness  75 y.o. female with medical history significant for diabetes mellitus on oral hypoglycemic agents, hypertension, hypothyroidism, history of breast cancer and bipolar disorder who presents to the ER via EMS for evaluation of weakness.  Clinical Impression  Pt was eager to get up and did well with PT exam.  She did not have any overt LOBs/unsteadiness but consistently had walker veering to the L that she struggled to rectify even with heavy cuing; reports she has had some issues with veering for many months now.  Pt lives alone and is appears that she did have some consistent assist in the home that apparently is no longer happening, all family is out of town; in short she is not safe to go home and does not have the support network needed to make it so.  Pt agrees that she will need STR at discharge and acknowledges that she has become less safe at home and may need to look at higher level of care moving forward once she is ready to return home.     Follow Up Recommendations SNF    Equipment Recommendations   (has rollator, would benefit from [place] if she does not have one)    Recommendations for Other Services       Precautions / Restrictions Precautions Precautions: Fall Restrictions Weight Bearing Restrictions: No      Mobility  Bed Mobility Overal bed mobility: Needs Assistance Bed Mobility: Supine to Sit;Sit to Supine     Supine to sit: Min assist Sit to supine: Min assist   General bed mobility comments: Pt was able to initiate movement to EOB relatively well, but did need assist to elevate trunk and later to get LEs back into bed    Transfers Overall transfer level: Needs assistance Equipment used: Rolling walker (2 wheeled) Transfers: Sit to/from Stand Sit to Stand: Min guard         General transfer comment:  close guarding secondary to (relatively tall) height of bed vs (relatively short) height of pt  Ambulation/Gait Ambulation/Gait assistance: Min assist Gait Distance (Feet): 50 Feet Assistive device: Rolling walker (2 wheeled)       General Gait Details: Pt was able to ambulate out of room and into the hallway, however she consistently had issues with veering to the L, struggled to correct this even with repeated cuing.  Pt did not have any LOBs but PT did need to phyiscally move the walker to get pt inside BOS.  Stairs            Wheelchair Mobility    Modified Rankin (Stroke Patients Only)       Balance Overall balance assessment: Needs assistance Sitting-balance support: Bilateral upper extremity supported Sitting balance-Leahy Scale: Fair Sitting balance - Comments: pt leaning backward much of the time, but generally able to maintain upright with light UE use Postural control: Posterior lean   Standing balance-Leahy Scale: Fair Standing balance comment: reliant on walker (that she consistently would get herself outside of) but she did not have any overt LOBs/buckling/etc                             Pertinent Vitals/Pain Pain Assessment: No/denies pain    Home Living Family/patient expects to be discharged to:: Skilled nursing facility  Additional Comments: pt lives at [facility], reports she has had assist/aide in the past, but no longer?    Prior Function Level of Independence: Needs assistance   Gait / Transfers Assistance Needed: Pt reports that not long ago she was able to walk around the grocery store, etc - now struggles to do more than limited in home distances and has had repeated falls           Hand Dominance        Extremity/Trunk Assessment   Upper Extremity Assessment Upper Extremity Assessment: Generalized weakness;Overall [facility] for tasks assessed    Lower Extremity Assessment Lower  Extremity Assessment: Generalized weakness;Overall WFL for tasks assessed       Communication   Communication: No difficulties  Cognition Arousal/Alertness: Awake/alert Behavior During Therapy: WFL for tasks assessed/performed Overall Cognitive Status: Within Functional Limits for tasks assessed                                        General Comments      Exercises     Assessment/Plan    PT Assessment Patient needs continued PT services  PT Problem List Decreased strength;Decreased range of motion;Decreased activity tolerance;Decreased balance       PT Treatment Interventions DME instruction;Gait training;Functional mobility training;Therapeutic activities;Therapeutic exercise;Balance training;Neuromuscular re-education;Patient/family education;Cognitive remediation    PT Goals (Current goals can be found in the Care Plan section)  Acute Rehab PT Goals Patient Stated Goal: get back to independent living PT Goal Formulation: With patient Time For Goal Achievement: 08/08/20 Potential to Achieve Goals: Fair    Frequency Min 2X/week   Barriers to discharge        Co-evaluation               AM-PAC PT "6 Clicks" Mobility  Outcome Measure Help needed turning from your back to your side while in a flat bed without using bedrails?: A Little Help needed moving from lying on your back to sitting on the side of a flat bed without using bedrails?: A Little Help needed moving to and from a bed to a chair (including a wheelchair)?: A Little Help needed standing up from a chair using your arms (e.g., wheelchair or bedside chair)?: A Little Help needed to walk in hospital room?: A Lot Help needed climbing 3-5 steps with a railing? : A Lot 6 Click Score: 16    End of Session Equipment Utilized During Treatment: Gait belt Activity Tolerance: Patient tolerated treatment well;Patient limited by fatigue Patient left: with call bell/phone within reach;with  nursing/sitter in room;in bed Nurse Communication: Mobility status PT Visit Diagnosis: Muscle weakness (generalized) (M62.81);Difficulty in walking, not elsewhere classified (R26.2);History of falling (Z91.81)    Time: 9242-6834 PT Time Calculation (min) (ACUTE ONLY): 28 min   Charges:   PT Evaluation $PT Eval Low Complexity: 1 Low PT Treatments $Gait Training: 8-22 mins        Kreg Shropshire, DPT 07/25/2020, 1:28 PM

## 2020-07-26 DIAGNOSIS — E1110 Type 2 diabetes mellitus with ketoacidosis without coma: Secondary | ICD-10-CM | POA: Diagnosis not present

## 2020-07-26 DIAGNOSIS — R531 Weakness: Secondary | ICD-10-CM | POA: Diagnosis not present

## 2020-07-26 DIAGNOSIS — I10 Essential (primary) hypertension: Secondary | ICD-10-CM | POA: Diagnosis not present

## 2020-07-26 LAB — BASIC METABOLIC PANEL
Anion gap: 8 (ref 5–15)
BUN: 17 mg/dL (ref 8–23)
CO2: 24 mmol/L (ref 22–32)
Calcium: 8.6 mg/dL — ABNORMAL LOW (ref 8.9–10.3)
Chloride: 105 mmol/L (ref 98–111)
Creatinine, Ser: 0.96 mg/dL (ref 0.44–1.00)
GFR, Estimated: >60 mL/min (ref >60)
Glucose, Bld: 197 mg/dL — ABNORMAL HIGH (ref 70–99)
Potassium: 3.3 mmol/L — ABNORMAL LOW (ref 3.5–5.1)
Sodium: 137 mmol/L (ref 135–145)

## 2020-07-26 LAB — GLUCOSE, CAPILLARY
Glucose-Capillary: 201 mg/dL — ABNORMAL HIGH (ref 70–99)
Glucose-Capillary: 238 mg/dL — ABNORMAL HIGH (ref 70–99)
Glucose-Capillary: 186 mg/dL — ABNORMAL HIGH (ref 70–99)
Glucose-Capillary: 202 mg/dL — ABNORMAL HIGH (ref 70–99)

## 2020-07-26 MED ORDER — POTASSIUM CHLORIDE CRYS ER 20 MEQ PO TBCR
40.0000 meq | EXTENDED_RELEASE_TABLET | Freq: Three times a day (TID) | ORAL | Status: AC
  Administered 2020-07-26 – 2020-07-27 (×3): 40 meq via ORAL
  Filled 2020-07-26 (×3): qty 2

## 2020-07-26 MED ORDER — MAGNESIUM SULFATE 2 GM/50ML IV SOLN
2.0000 g | Freq: Once | INTRAVENOUS | Status: AC
  Administered 2020-07-26: 2 g via INTRAVENOUS
  Filled 2020-07-26: qty 50

## 2020-07-26 MED ORDER — INSULIN GLARGINE 100 UNIT/ML ~~LOC~~ SOLN
10.0000 [IU] | Freq: Two times a day (BID) | SUBCUTANEOUS | Status: DC
  Administered 2020-07-26 – 2020-07-28 (×4): 10 [IU] via SUBCUTANEOUS
  Filled 2020-07-26 (×5): qty 0.1

## 2020-07-26 NOTE — NC FL2 (Signed)
[place] LEVEL OF CARE SCREENING TOOL     IDENTIFICATION  Patient Name: [name] Birthdate: 12-04-1945 Sex: female Admission Date (Current Location): 07/24/2020  [place] and [place] Number:  Engineering geologist and Address:  [facility], [address] [place], [place] [address]      Provider Number: [id number]  Attending Physician Name and Address:  Charlynne Cousins, MD  Relative Name and Phone Number:  [name] [phone]    Current Level of Care: Hospital Recommended Level of Care: [place] Prior Approval Number:    Date Approved/Denied:   PASRR Number: pending  Discharge Plan: SNF    Current Diagnoses: Patient Active Problem List   Diagnosis Date Noted  . DKA (diabetic ketoacidosis) ([place]) 07/24/2020  . Bipolar disorder current episode depressed ([place]) 01/07/2020  . MDD (major depressive disorder) 08/10/2019  . MDD (major depressive disorder), recurrent episode, severe ([place]) 06/24/2019  . Pain due to onychomycosis of toenails of both feet 11/23/2018  . Bipolar 1 disorder, manic, moderate ([place]) 07/21/2018  . Bipolar 1 disorder, mixed, severe ([place]) 07/21/2018  . Bipolar 1 disorder with moderate mania ([place]) 07/20/2018  . Severe bipolar I disorder, current or most recent episode depressed ([place]) 07/07/2017  . PTSD (post-traumatic stress disorder) 07/07/2017  . 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 Diabetes ([place]) 09/23/2014  . Essential hypertension 09/21/2014  . Hypothyroidism 09/21/2014  . Dyslipidemia 09/21/2014  . Bipolar I disorder, current or most recent episode manic, severe with mixed features ([place]) 09/20/2014    Orientation RESPIRATION BLADDER Height & Weight     Self,Time,Place,Situation  Normal Continent Weight: 78.6 kg Height:  5\' 2"  (157.5 cm)  BEHAVIORAL SYMPTOMS/MOOD NEUROLOGICAL BOWEL NUTRITION STATUS      Continent Diet (heart  healthy/ carb modified)  AMBULATORY STATUS COMMUNICATION OF NEEDS Skin   Limited Assist Verbally Normal                       Personal Care Assistance Level of Assistance  Bathing,Feeding,Dressing Bathing Assistance: Limited assistance Feeding assistance: Limited assistance Dressing Assistance: Limited assistance     Functional Limitations Info             SPECIAL CARE FACTORS FREQUENCY  PT (By licensed PT),OT (By licensed OT)     PT Frequency: 5 times per week OT Frequency: 5 times per week            Contractures Contractures Info: Not present    Additional Factors Info  Code Status,Allergies Code Status Info: Full Allergies Info: [place], PCN           Current Medications (07/26/2020):  This is the current hospital active medication list Current Facility-Administered Medications  Medication Dose Route Frequency Provider Last Rate Last Admin  . acetaminophen (TYLENOL) tablet 650 mg  650 mg Oral Q6H PRN Lang Snow, NP   650 mg at 07/25/20 2246  . Chlorhexidine Gluconate Cloth 2 % PADS 6 each  6 each Topical Daily Agbata, Tochukwu, MD   6 each at 07/26/20 1005  . clonazePAM (KLONOPIN) tablet 1 mg  1 mg Oral BID Agbata, Tochukwu, MD   1 mg at 07/26/20 0910  . dextrose 50 % solution 0-50 mL  0-50 mL Intravenous PRN Delman Kitten, MD      . divalproex (DEPAKOTE ER) 24 hr tablet 1,000 mg  1,000 mg Oral QHS Agbata, Tochukwu, MD   1,000 mg at 07/25/20  2138  . enoxaparin (LOVENOX) injection 40 mg  40 mg Subcutaneous Q24H Agbata, Tochukwu, MD   40 mg at 07/25/20 2141  . FLUoxetine (PROZAC) capsule 60 mg  60 mg Oral Daily Agbata, Tochukwu, MD   60 mg at 07/26/20 0909  . insulin aspart (novoLOG) injection 0-15 Units  0-15 Units Subcutaneous TID WC Lang Snow, NP   5 Units at 07/26/20 1231  . insulin aspart (novoLOG) injection 0-5 Units  0-5 Units Subcutaneous QHS Lang Snow, NP   2 Units at 07/25/20 2247  . insulin aspart (novoLOG)  injection 4 Units  4 Units Subcutaneous TID WC Lang Snow, NP   4 Units at 07/26/20 1231  . insulin glargine (LANTUS) injection 10 Units  10 Units Subcutaneous BID Charlynne Cousins, MD      . levothyroxine (SYNTHROID) tablet 88 mcg  88 mcg Oral QAC breakfast Agbata, Tochukwu, MD   88 mcg at 07/26/20 0539  . loratadine (CLARITIN) tablet 10 mg  10 mg Oral Daily Agbata, Tochukwu, MD   10 mg at 07/26/20 0910  . lurasidone (LATUDA) tablet 20 mg  20 mg Oral Daily Agbata, Tochukwu, MD   20 mg at 07/26/20 0910  . MEDLINE mouth rinse  15 mL Mouth Rinse BID Agbata, Tochukwu, MD   15 mL at 07/26/20 0910  . potassium chloride SA (KLOR-CON) CR tablet 40 mEq  40 mEq Oral TID Charlynne Cousins, MD      . rosuvastatin (CRESTOR) tablet 40 mg  40 mg Oral QHS Agbata, Tochukwu, MD   40 mg at 07/25/20 2139     Discharge Medications: Please see discharge summary for a list of discharge medications.  Relevant Imaging Results:  Relevant Lab Results:   Additional Information [id number],  has had both covid vaccines and flu shot  Shelbie Hutching, RN
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
## 2020-07-26 NOTE — Progress Notes (Signed)
   07/26/20 1340  Clinical Encounter Type  Visited With Patient  Visit Type Initial;Psychological support;Spiritual support;Social support  Referral From Nurse  Consult/Referral To [facility] responded to a request from a PT to have AD completed. Chaplain did AD education with [name] and left a pamphlet for her to complete. She asked Chaplain to leave to leave the pamphlet for a few days, and she will let nurses know when she needed a Chaplain to have it notarized.
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
## 2020-07-26 NOTE — Progress Notes (Signed)
To whom it may concern:  Please be advised that the above named patient will require a short term nursing home stay - anticipated 30 days or less for rehabilitation and strengthening.  The plan is for return home.  

## 2020-07-26 NOTE — Progress Notes (Signed)
PROGRESS NOTE    [name]  GUY:[id number] DOB: [birth year] DOA: 07/24/2020 PCP: Mechele Claude, FNP    Brief Narrative:  [name] is a 75 year old female who was brought to the ER by EMS for evaluation of weakness and found to be in DKA.  Patient was started on an insulin drip and will be admitted to the hospital for further evaluation.   Subjective: No new complaints feels better today tolerating her diet.  Objective: Vitals:   07/25/20 2336 07/26/20 0442 07/26/20 0829 07/26/20 1150  BP: 130/84 125/74 (!) 136/95 132/84  Pulse: 75 (!) 58 69 67  Resp: 20 20 10 17   Temp: 98.2 F (36.8 C) 97.7 F (36.5 C) 98 F (36.7 C) 97.6 F (36.4 C)  TempSrc: Oral Oral Oral Oral  SpO2: 92% 95% 94% 90%  Weight:      Height:        Intake/Output Summary (Last 24 hours) at 07/26/2020 1219 Last data filed at 07/26/2020 0900 Gross per 24 hour  Intake 1276.7 ml  Output 3800 ml  Net -2523.3 ml   Filed Weights   07/24/20 1400  Weight: 78.6 kg    Examination: General exam: In no acute distress. Respiratory system: Good air movement and clear to auscultation. Cardiovascular system: S1 & S2 heard, RRR. No JVD. Gastrointestinal system: Abdomen is nondistended, soft and nontender.  Extremities: No pedal edema. Skin: No rashes, lesions or ulcers Psychiatry: Judgement and insight appear normal. Mood & affect appropriate.   Data Reviewed: I have personally reviewed following labs and imaging studies  CBC: Recent Labs  Lab 07/24/20 0725  WBC 8.1  NEUTROABS 6.0  HGB 16.2*  HCT 50.0*  MCV 90.3  PLT 563   Basic Metabolic Panel: Recent Labs  Lab 07/24/20 1350 07/24/20 1754 07/24/20 2127 07/25/20 0433 07/26/20 0637  NA 135 136 134* 137 137  K 3.8 3.8 4.0 4.0 3.3*  CL 104 107 105 107 105  CO2 18* 20* 21* 21* 24  GLUCOSE 372* 150* 286* 190* 197*  BUN 36* 31* 27* 22 17  CREATININE 1.13* 1.00 0.93 0.93 0.96  CALCIUM 8.6* 8.5* 8.4* 8.4* 8.6*    GFR: Estimated Creatinine Clearance: 49.2 mL/min (by C-G formula based on SCr of 0.96 mg/dL). Liver Function Tests: No results for input(s): AST, ALT, ALKPHOS, BILITOT, PROT, ALBUMIN in the last 168 hours. No results for input(s): LIPASE, AMYLASE in the last 168 hours. No results for input(s): AMMONIA in the last 168 hours. Coagulation Profile: Recent Labs  Lab 07/24/20 0735  INR 1.1   Cardiac Enzymes: Recent Labs  Lab 07/24/20 0725  CKTOTAL 127   BNP (last 3 results) No results for input(s): PROBNP in the last 8760 hours. HbA1C: Recent Labs    07/24/20 1350  HGBA1C 14.2*   CBG: Recent Labs  Lab 07/25/20 1124 07/25/20 1602 07/25/20 2220 07/26/20 0814 07/26/20 1151  GLUCAP 281* 220* 240* 201* 238*   Lipid Profile: No results for input(s): CHOL, HDL, LDLCALC, TRIG, CHOLHDL, LDLDIRECT in the last 72 hours. Thyroid Function Tests: No results for input(s): TSH, T4TOTAL, FREET4, T3FREE, THYROIDAB in the last 72 hours. Anemia Panel: No results for input(s): VITAMINB12, FOLATE, FERRITIN, TIBC, IRON, RETICCTPCT in the last 72 hours. Sepsis Labs: Recent Labs  Lab 07/24/20 0911  LATICACIDVEN 2.0*    Recent Results (from the past 240 hour(s))  SARS CORONAVIRUS 2 (TAT 6-24 HRS) Nasopharyngeal Nasopharyngeal Swab     Status: None   Collection Time: 07/24/20 12:24 PM  Specimen: Nasopharyngeal Swab  Result Value Ref Range Status   SARS Coronavirus 2 NEGATIVE NEGATIVE Final    Comment: (NOTE) SARS-CoV-2 target nucleic acids are NOT DETECTED.  The SARS-CoV-2 RNA is generally detectable in upper and lower respiratory specimens during the acute phase of infection. Negative results do not preclude SARS-CoV-2 infection, do not rule out co-infections with other pathogens, and should not be used as the sole basis for treatment or other patient management decisions. Negative results must be combined with clinical observations, patient history, and epidemiological  information. The expected result is Negative.  Fact Sheet for Patients: SugarRoll.be  Fact Sheet for Healthcare Providers: https://www.woods-mathews.com/  This test is not yet approved or cleared by the [place] FDA and  has been authorized for detection and/or diagnosis of SARS-CoV-2 by FDA under an Emergency Use Authorization (EUA). This EUA will remain  in effect (meaning this test can be used) for the duration of the COVID-19 declaration under Se ction 564(b)(1) of the Act, 21 U.S.C. section 360bbb-3(b)(1), unless the authorization is terminated or revoked sooner.  Performed at [facility], [place] [address], [place], [place] [address]   MRSA PCR Screening     Status: None   Collection Time: 07/24/20  2:30 PM   Specimen: Nasal Mucosa; Nasopharyngeal  Result Value Ref Range Status   MRSA by PCR NEGATIVE NEGATIVE Final    Comment:        The GeneXpert MRSA Assay (FDA approved for NASAL specimens only), is one component of a comprehensive MRSA colonization surveillance program. It is not intended to diagnose MRSA infection nor to guide or monitor treatment for MRSA infections. Performed at [facility], [address], [place], [place] [address]          Radiology Studies: No results found.      Scheduled Meds: . Chlorhexidine Gluconate Cloth  6 each Topical Daily  . clonazePAM  1 mg Oral BID  . divalproex  1,000 mg Oral QHS  . enoxaparin (LOVENOX) injection  40 mg Subcutaneous Q24H  . FLUoxetine  60 mg Oral Daily  . insulin aspart  0-15 Units Subcutaneous TID WC  . insulin aspart  0-5 Units Subcutaneous QHS  . insulin aspart  4 Units Subcutaneous TID WC  . insulin glargine  10 Units Subcutaneous BID  . levothyroxine  88 mcg Oral QAC breakfast  . loratadine  10 mg Oral Daily  . lurasidone  20 mg Oral Daily  . mouth rinse  15 mL Mouth Rinse BID  . potassium chloride  40 mEq Oral TID  .  rosuvastatin  40 mg Oral QHS   Continuous Infusions: . magnesium sulfate bolus IVPB      Assessment & Plan:   Principal Problem:   DKA (diabetic ketoacidosis) ([place]) Active Problems:   Essential hypertension   Hypothyroidism   Severe bipolar I disorder, current or most recent episode depressed ([place])  DKA Likely due to noncompliance with his medication. KVO IV fluids. Increase her long-acting insulin continue sliding scale insulin. Physical therapy evaluated the patient the recommended skilled nursing facility. Social worker has been consulted and she is medically stable for transfer.   Essential hypertension Continue to hold ACE inhibitor can be resumed as an outpatient. Blood pressure is improved.  Hypothyroidism Continue Synthroid   Severe bipolar disorder Continue Latuda, fluoxetine, Depakote   DVT prophylaxis: Lovenox Code Status: Full Family Communication: None at bedside  Status is: Inpatient  Remains inpatient appropriate because:Inpatient level of care appropriate  due to severity of illness   Dispo: The patient is from: Home              Anticipated d/c is to: SNF              Patient currently is not medically stable to d/c.   Difficult to place patient No    anticipated discharge date 1 to 2 days.  Still on IV fluids due to low blood pressure.           LOS: 2 days   Time spent: 35 minutes    Charlynne Cousins, MD [facility] Pager [phone]  If 7PM-7AM, please contact night-coverage 07/26/2020, 12:19 PM

## 2020-07-26 NOTE — Progress Notes (Signed)
Asked patient if she had taken insulin injections before she stated that she had years ago when first diagnosed with diabetes. Encouraged and showed her how to inject herself. With teach back demonstration she injected herself with evening insulin and did very well. Understood all concepts taught. Had no concerns or discomfort. She is resting in bed with call bell in reach.

## 2020-07-27 DIAGNOSIS — R9431 Abnormal electrocardiogram [ECG] [EKG]: Secondary | ICD-10-CM

## 2020-07-27 DIAGNOSIS — I10 Essential (primary) hypertension: Secondary | ICD-10-CM | POA: Diagnosis not present

## 2020-07-27 DIAGNOSIS — E1110 Type 2 diabetes mellitus with ketoacidosis without coma: Secondary | ICD-10-CM | POA: Diagnosis not present

## 2020-07-27 DIAGNOSIS — E039 Hypothyroidism, unspecified: Secondary | ICD-10-CM | POA: Diagnosis not present

## 2020-07-27 LAB — GLUCOSE, CAPILLARY
Glucose-Capillary: 188 mg/dL — ABNORMAL HIGH (ref 70–99)
Glucose-Capillary: 260 mg/dL — ABNORMAL HIGH (ref 70–99)
Glucose-Capillary: 202 mg/dL — ABNORMAL HIGH (ref 70–99)
Glucose-Capillary: 123 mg/dL — ABNORMAL HIGH (ref 70–99)

## 2020-07-27 LAB — BASIC METABOLIC PANEL
Anion gap: 8 (ref 5–15)
BUN: 12 mg/dL (ref 8–23)
CO2: 26 mmol/L (ref 22–32)
Calcium: 8.7 mg/dL — ABNORMAL LOW (ref 8.9–10.3)
Chloride: 105 mmol/L (ref 98–111)
Creatinine, Ser: 0.89 mg/dL (ref 0.44–1.00)
GFR, Estimated: >60 mL/min (ref >60)
Glucose, Bld: 166 mg/dL — ABNORMAL HIGH (ref 70–99)
Potassium: 4.3 mmol/L (ref 3.5–5.1)
Sodium: 139 mmol/L (ref 135–145)

## 2020-07-27 MED ORDER — ENALAPRIL MALEATE 5 MG PO TABS
5.0000 mg | ORAL_TABLET | Freq: Two times a day (BID) | ORAL | Status: DC
  Administered 2020-07-27 – 2020-08-01 (×9): 5 mg via ORAL
  Filled 2020-07-27 (×12): qty 1

## 2020-07-27 MED ORDER — LINAGLIPTIN 5 MG PO TABS: 5.0000 mg | ORAL_TABLET | Freq: Two times a day (BID) | ORAL | Status: DC

## 2020-07-27 MED ORDER — SIMVASTATIN 20 MG PO TABS: 20.0000 mg | ORAL_TABLET | Freq: Every day | ORAL | Status: DC

## 2020-07-27 MED ORDER — COVID-19 MRNA VACC (MODERNA) 50 MCG/0.25ML IM SUSP
0.2500 mL | Freq: Once | INTRAMUSCULAR | Status: AC
  Administered 2020-07-27: 0.25 mL via INTRAMUSCULAR
  Filled 2020-07-27: qty 0.25

## 2020-07-27 MED ORDER — GLIPIZIDE ER 10 MG PO TB24: 10.0000 mg | ORAL_TABLET | Freq: Every day | ORAL | Status: DC

## 2020-07-27 NOTE — TOC Progression Note (Signed)
Transition of Care [facility]) - Progression Note    Patient Details  Name: [name] MRN: [id number] Date of Birth: 1945/12/01  [facility] CM/SW Contact  Shelbie Hutching, RN Phone Number: 07/27/2020, 4:53 PM  Clinical Narrative:    Pasrr is still pending but insurance Josem Kaufmann is approved and patient received her [place] booster.    Expected Discharge Plan: [place] Barriers to Discharge: Continued Medical Work up  Expected Discharge Plan and Services Expected Discharge Plan: [place]   Discharge Planning Services: CM Consult Post Acute Care Choice: [place] Living arrangements for the past 2 months: Apartment Expected Discharge Date: 07/27/20               DME Arranged: N/A DME Agency: NA       HH Arranged: NA HH Agency: NA         Social Determinants of Health (SDOH) Interventions    Readmission Risk Interventions No flowsheet data found.

## 2020-07-27 NOTE — Discharge Summary (Addendum)
Physician Discharge Summary  [name] JJH:[id number] DOB: 27-May-1945 DOA: 07/24/2020  PCP: Mechele Claude, FNP  Admit date: 07/24/2020 Discharge date: 07/27/2020  Admitted From: Home  Disposition: Skilled nursing facility   Recommendations for Outpatient Follow-up:  1. Follow up with PCP in 1-2 weeks 2. Please obtain BMP/CBC in one week   Home Health: No Equipment/Devices:none  Discharge Condition:Stable CODE STATUS:Full Diet recommendation: Heart Healthy  Brief/Interim Summary: 75 year old female who was brought to the ER by EMS for evaluation of weakness and found to be in DKA. Patient was started on an insulin drip and will be admitted to the hospital for further evaluation.   Discharge Diagnoses:  Principal Problem:   DKA (diabetic ketoacidosis) ([place]) Active Problems:   Essential hypertension   Hypothyroidism   Severe bipolar I disorder, current or most recent episode depressed ([place])  DKA: Likely due to noncompliance with her medications. She was started on IV fluids and IV insulin and her blood glucose improved. She will continue long-acting insulin sliding scale blood glucose remain well controlled. Her A1c was 14. She was switched to oral agents which she will continue as an outpatient no change made to her medication.  Physical therapy evaluated the patient and recommended home health PT.  Central hypertension: Held on admission blood pressure started to trend up she was resumed on her home regimen her blood pressures remain fairly controlled.  Hypothyroidism: Continue Synthroid.  Severe bipolar disorder No changes made to her medication.  Discharge Instructions   Allergies as of 07/27/2020      Reactions   Navane [thiothixene] Other (See Comments)   Reaction:  Unknown   Penicillins Rash   Has patient had a PCN reaction causing immediate rash, facial/tongue/throat swelling, SOB or lightheadedness with hypotension: No Has patient had a  PCN reaction causing severe rash involving mucus membranes or skin necrosis: No Has patient had a PCN reaction that required hospitalization: No Has patient had a PCN reaction occurring within the last 10 years: No If all of the above answers are "NO", then may proceed with Cephalosporin use.      Medication List    TAKE these medications   clonazePAM 1 MG tablet Commonly known as: KLONOPIN Take 1 tablet (1 mg total) by mouth 2 (two) times daily.   divalproex 500 MG 24 hr tablet Commonly known as: DEPAKOTE ER Take 2 tablets (1,000 mg total) by mouth at bedtime.   enalapril 5 MG tablet Commonly known as: VASOTEC Take 1 tablet (5 mg total) by mouth 2 (two) times daily.   FLUoxetine 20 MG capsule Commonly known as: PROZAC Take 60 mg by mouth daily.   glipiZIDE 10 MG 24 hr tablet Commonly known as: GLUCOTROL XL Take 1 tablet (10 mg total) by mouth daily with breakfast.   Latuda 40 MG Tabs tablet Generic drug: lurasidone Take 20 mg by mouth daily.   levothyroxine 88 MCG tablet Commonly known as: SYNTHROID Take 88 mcg by mouth daily before breakfast.   linagliptin 5 MG Tabs tablet Commonly known as: TRADJENTA Take 5 mg by mouth 2 (two) times daily.   loratadine 10 MG tablet Commonly known as: CLARITIN Take 1 tablet (10 mg total) by mouth daily.   rosuvastatin 40 MG tablet Commonly known as: CRESTOR Take 1 tablet (40 mg total) by mouth daily.   simvastatin 20 MG tablet Commonly known as: ZOCOR Take 1 tablet by mouth daily.       Allergies  Allergen Reactions  . Navane [Thiothixene]  Other (See Comments)    Reaction:  Unknown  . Penicillins Rash    Has patient had a PCN reaction causing immediate rash, facial/tongue/throat swelling, SOB or lightheadedness with hypotension: No Has patient had a PCN reaction causing severe rash involving mucus membranes or skin necrosis: No Has patient had a PCN reaction that required hospitalization: No Has patient had a PCN  reaction occurring within the last 10 years: No If all of the above answers are "NO", then may proceed with Cephalosporin use.     Consultations:  None   Procedures/Studies: CT Head Wo Contrast  Result Date: 07/24/2020 CLINICAL DATA:  Facial trauma.  Fall with occipital swelling. EXAM: CT HEAD WITHOUT CONTRAST TECHNIQUE: Contiguous axial images were obtained from the base of the skull through the vertex without intravenous contrast. COMPARISON:  04/06/2020 FINDINGS: Brain: No evidence of acute infarction, hemorrhage, hydrocephalus, extra-axial collection or mass lesion/mass effect. Central predominant cerebral volume loss. Chronic small vessel ischemia in the deep white matter. Vascular: No hyperdense vessel or unexpected calcification. Skull: Normal. Negative for fracture or focal lesion. Sinuses/Orbits: No acute finding. IMPRESSION: No evidence of intracranial injury. Electronically Signed   By: Monte Fantasia M.D.   On: 07/24/2020 07:59    (Echo, Carotid, EGD, Colonoscopy, ERCP)    Subjective: No new complaints  Discharge Exam: Vitals:   07/27/20 0555 07/27/20 0845  BP: (!) 144/91 136/87  Pulse:  69  Resp:  16  Temp:  98.4 F (36.9 C)  SpO2:  94%   Vitals:   07/27/20 0011 07/27/20 0338 07/27/20 0555 07/27/20 0845  BP: 137/84 (!) 148/91 (!) 144/91 136/87  Pulse:  66  69  Resp:  16  16  Temp:  98 F (36.7 C)  98.4 F (36.9 C)  TempSrc:  Oral    SpO2:  93%  94%  Weight:      Height:        General: Pt is alert, awake, not in acute distress Cardiovascular: RRR, S1/S2 +, no rubs, no gallops Respiratory: CTA bilaterally, no wheezing, no rhonchi Abdominal: Soft, NT, ND, bowel sounds + Extremities: no edema, no cyanosis    The results of significant diagnostics from this hospitalization (including imaging, microbiology, ancillary and laboratory) are listed below for reference.     Microbiology: Recent Results (from the past 240 hour(s))  SARS CORONAVIRUS 2 (TAT  6-24 HRS) Nasopharyngeal Nasopharyngeal Swab     Status: None   Collection Time: 07/24/20 12:24 PM   Specimen: Nasopharyngeal Swab  Result Value Ref Range Status   SARS Coronavirus 2 NEGATIVE NEGATIVE Final    Comment: (NOTE) SARS-CoV-2 target nucleic acids are NOT DETECTED.  The SARS-CoV-2 RNA is generally detectable in upper and lower respiratory specimens during the acute phase of infection. Negative results do not preclude SARS-CoV-2 infection, do not rule out co-infections with other pathogens, and should not be used as the sole basis for treatment or other patient management decisions. Negative results must be combined with clinical observations, patient history, and epidemiological information. The expected result is Negative.  Fact Sheet for Patients: SugarRoll.be  Fact Sheet for Healthcare Providers: https://www.woods-mathews.com/  This test is not yet approved or cleared by the [place] FDA and  has been authorized for detection and/or diagnosis of SARS-CoV-2 by FDA under an Emergency Use Authorization (EUA). This EUA will remain  in effect (meaning this test can be used) for the duration of the COVID-19 declaration under Se ction 564(b)(1) of the Act, 21 U.S.C. section 360bbb-3(b)(1),  unless the authorization is terminated or revoked sooner.  Performed at [facility], [place] [address], [place], [place] [address]   MRSA PCR Screening     Status: None   Collection Time: 07/24/20  2:30 PM   Specimen: Nasal Mucosa; Nasopharyngeal  Result Value Ref Range Status   MRSA by PCR NEGATIVE NEGATIVE Final    Comment:        The GeneXpert MRSA Assay (FDA approved for NASAL specimens only), is one component of a comprehensive MRSA colonization surveillance program. It is not intended to diagnose MRSA infection nor to guide or monitor treatment for MRSA infections. Performed at [facility], [address]  [place], [place] [address]      Labs: BNP (last 3 results) Recent Labs    03/01/20 1312  BNP 87.6   Basic Metabolic Panel: Recent Labs  Lab 07/24/20 1754 07/24/20 2127 07/25/20 0433 07/26/20 0637 07/27/20 0547  NA 136 134* 137 137 139  K 3.8 4.0 4.0 3.3* 4.3  CL 107 105 107 105 105  CO2 20* 21* 21* 24 26  GLUCOSE 150* 286* 190* 197* 166*  BUN 31* 27* 22 17 12   CREATININE 1.00 0.93 0.93 0.96 0.89  CALCIUM 8.5* 8.4* 8.4* 8.6* 8.7*   Liver Function Tests: No results for input(s): AST, ALT, ALKPHOS, BILITOT, PROT, ALBUMIN in the last 168 hours. No results for input(s): LIPASE, AMYLASE in the last 168 hours. No results for input(s): AMMONIA in the last 168 hours. CBC: Recent Labs  Lab 07/24/20 0725  WBC 8.1  NEUTROABS 6.0  HGB 16.2*  HCT 50.0*  MCV 90.3  PLT 186   Cardiac Enzymes: Recent Labs  Lab 07/24/20 0725  CKTOTAL 127   BNP: Invalid input(s): POCBNP CBG: Recent Labs  Lab 07/26/20 0814 07/26/20 1151 07/26/20 1542 07/26/20 2049 07/27/20 0845  GLUCAP 201* 238* 186* 202* 188*   D-Dimer No results for input(s): DDIMER in the last 72 hours. Hgb A1c Recent Labs    07/24/20 1350  HGBA1C 14.2*   Lipid Profile No results for input(s): CHOL, HDL, LDLCALC, TRIG, CHOLHDL, LDLDIRECT in the last 72 hours. Thyroid function studies No results for input(s): TSH, T4TOTAL, T3FREE, THYROIDAB in the last 72 hours.  Invalid input(s): FREET3 Anemia work up No results for input(s): VITAMINB12, FOLATE, FERRITIN, TIBC, IRON, RETICCTPCT in the last 72 hours. Urinalysis    Component Value Date/Time   COLORURINE YELLOW (A) 07/24/2020 1041   APPEARANCEUR CLEAR (A) 07/24/2020 1041   APPEARANCEUR Clear 02/24/2017 1050   LABSPEC 1.027 07/24/2020 1041   LABSPEC 1.018 02/25/2014 0927   PHURINE 5.0 07/24/2020 1041   GLUCOSEU >=500 (A) 07/24/2020 1041   GLUCOSEU Negative 02/25/2014 0927   HGBUR SMALL (A) 07/24/2020 1041   BILIRUBINUR NEGATIVE 07/24/2020 1041    BILIRUBINUR Negative 02/24/2017 1050   BILIRUBINUR Negative 02/25/2014 0927   KETONESUR 80 (A) 07/24/2020 1041   PROTEINUR NEGATIVE 07/24/2020 1041   NITRITE NEGATIVE 07/24/2020 1041   LEUKOCYTESUR NEGATIVE 07/24/2020 1041   LEUKOCYTESUR 3+ 02/25/2014 0927   Sepsis Labs Invalid input(s): PROCALCITONIN,  WBC,  LACTICIDVEN Microbiology Recent Results (from the past 240 hour(s))  SARS CORONAVIRUS 2 (TAT 6-24 HRS) Nasopharyngeal Nasopharyngeal Swab     Status: None   Collection Time: 07/24/20 12:24 PM   Specimen: Nasopharyngeal Swab  Result Value Ref Range Status   SARS Coronavirus 2 NEGATIVE NEGATIVE Final    Comment: (NOTE) SARS-CoV-2 target nucleic acids are NOT DETECTED.  The SARS-CoV-2 RNA is generally  detectable in upper and lower respiratory specimens during the acute phase of infection. Negative results do not preclude SARS-CoV-2 infection, do not rule out co-infections with other pathogens, and should not be used as the sole basis for treatment or other patient management decisions. Negative results must be combined with clinical observations, patient history, and epidemiological information. The expected result is Negative.  Fact Sheet for Patients: SugarRoll.be  Fact Sheet for Healthcare Providers: https://www.woods-mathews.com/  This test is not yet approved or cleared by the [place] FDA and  has been authorized for detection and/or diagnosis of SARS-CoV-2 by FDA under an Emergency Use Authorization (EUA). This EUA will remain  in effect (meaning this test can be used) for the duration of the COVID-19 declaration under Se ction 564(b)(1) of the Act, 21 U.S.C. section 360bbb-3(b)(1), unless the authorization is terminated or revoked sooner.  Performed at [facility], [place] [address], [place], [place] [address]   MRSA PCR Screening     Status: None   Collection Time: 07/24/20  2:30 PM   Specimen: Nasal Mucosa;  Nasopharyngeal  Result Value Ref Range Status   MRSA by PCR NEGATIVE NEGATIVE Final    Comment:        The GeneXpert MRSA Assay (FDA approved for NASAL specimens only), is one component of a comprehensive MRSA colonization surveillance program. It is not intended to diagnose MRSA infection nor to guide or monitor treatment for MRSA infections. Performed at [facility], [address] [place], [place] [address]      Time coordinating discharge: Over 40 minutes  SIGNED:   Charlynne Cousins, MD  [facility] 07/27/2020, 9:48 AM Pager   If 7PM-7AM, please contact night-coverage [email or web address] Password [email or web address]

## 2020-07-27 NOTE — Progress Notes (Signed)
Inpatient Diabetes Program Recommendations  AACE/ADA: New Consensus Statement on Inpatient Glycemic Control (2015)  Target Ranges:  Prepandial:   less than 140 mg/dL      Peak postprandial:   less than 180 mg/dL (1-2 hours)      Critically ill patients:  140 - 180 mg/dL   Results for AKYAH, LAGRANGE (MRN [id number]) as of 07/27/2020 07:07  Ref. Range 07/26/2020 08:14 07/26/2020 11:51 07/26/2020 15:42 07/26/2020 20:49  Glucose-Capillary Latest Ref Range: 70 - 99 mg/dL 201 (H)  7 units NOVOLOG  238 (H)  9 units NOVOLOG  186 (H)  7 units NOVOLOG  202 (H)  2 units NOVOLOG  10 units LANTUS   Results for ZULEMA, PULASKI (MRN [id number]) as of 07/27/2020 09:07  Ref. Range 07/27/2020 08:45  Glucose-Capillary Latest Ref Range: 70 - 99 mg/dL 188 (H)   Home DM Meds: Glipizide 10 mgDaily Tradjenta 5 mg BID  Current Orders:Novolog Moderate Correction Scale/ SSI (0-15 units) TID AC + HS Novolog 4 units TID with meals Lantus 10 units Q24 hours    MD- Please consider:  1. Increase Lantus slightly to 12 units QHS  2. Increase Novolog Meal Coverage to 6 units TID with meals    --Will follow patient during hospitalization--  Wyn Quaker RN, MSN, CDE Diabetes Coordinator Inpatient Glycemic Control Team Team Pager: [phone] (8a-5p)

## 2020-07-27 NOTE — TOC Progression Note (Signed)
Transition of Care [facility]) - Progression Note    Patient Details  Name: [name] MRN: [id number] Date of Birth: 1946/03/13  [facility] CM/SW Contact  Shelbie Hutching, RN Phone Number: 07/27/2020, 12:50 PM  Clinical Narrative:    Insurance authorization has been approved auth ID [id number], reference number [id number].  Approved start today through 3/14- next review 3/14.  Pasrr still pending.  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    Expected Discharge Plan: [place] Barriers to Discharge: Continued Medical Work up  Expected Discharge Plan and Services Expected Discharge Plan: [place]   Discharge Planning Services: CM Consult Post Acute Care Choice: [place] Living arrangements for the past 2 months: Apartment Expected Discharge Date: 07/27/20               DME Arranged: N/A DME Agency: NA       HH Arranged: NA HH Agency: NA         Social Determinants of Health (SDOH) Interventions    Readmission Risk Interventions No flowsheet data found.

## 2020-07-27 NOTE — Care Management Important Message (Signed)
Important Message  Patient Details  Name: [name] MRN: [id number] Date of Birth: Jun 14, 1945   Medicare Important Message Given:  N/A - LOS <3 / Initial given by admissions     Juliann Pulse A Alec Jaros 07/27/2020, 8:46 AM

## 2020-07-27 NOTE — TOC Progression Note (Signed)
Transition of Care [facility]) - Progression Note    Patient Details  Name: [name] MRN: [id number] Date of Birth: 07-08-1945  [facility] CM/SW Contact  Shelbie Hutching, RN Phone Number: 07/27/2020, 11:23 AM  Clinical Narrative:    Patient is medically cleared for discharge to SNF.  [facility] and [facility] do have a bed available today.  Pasrr is pending, admissions needs to check to see if they can accept the patient with pasrr pending.  RNCM has started insurance authorization through [employer] Portal.     Expected Discharge Plan: [place] Barriers to Discharge: Continued Medical Work up  Expected Discharge Plan and Services Expected Discharge Plan: [place]   Discharge Planning Services: CM Consult Post Acute Care Choice: [place] Living arrangements for the past 2 months: Apartment Expected Discharge Date: 07/27/20               DME Arranged: N/A DME Agency: NA       HH Arranged: NA HH Agency: NA         Social Determinants of Health (SDOH) Interventions    Readmission Risk Interventions No flowsheet data found.

## 2020-07-28 DIAGNOSIS — E1110 Type 2 diabetes mellitus with ketoacidosis without coma: Secondary | ICD-10-CM | POA: Diagnosis not present

## 2020-07-28 DIAGNOSIS — I10 Essential (primary) hypertension: Secondary | ICD-10-CM | POA: Diagnosis not present

## 2020-07-28 DIAGNOSIS — R531 Weakness: Secondary | ICD-10-CM | POA: Diagnosis not present

## 2020-07-28 LAB — GLUCOSE, CAPILLARY
Glucose-Capillary: 175 mg/dL — ABNORMAL HIGH (ref 70–99)
Glucose-Capillary: 201 mg/dL — ABNORMAL HIGH (ref 70–99)
Glucose-Capillary: 188 mg/dL — ABNORMAL HIGH (ref 70–99)
Glucose-Capillary: 156 mg/dL — ABNORMAL HIGH (ref 70–99)

## 2020-07-28 LAB — RESP PANEL BY RT-PCR (FLU A&B, COVID) ARPGX2
Influenza A by PCR: NEGATIVE
Influenza B by PCR: NEGATIVE
SARS Coronavirus 2 by RT PCR: NEGATIVE

## 2020-07-28 LAB — BASIC METABOLIC PANEL
Anion gap: 8 (ref 5–15)
BUN: 17 mg/dL (ref 8–23)
CO2: 26 mmol/L (ref 22–32)
Calcium: 9 mg/dL (ref 8.9–10.3)
Chloride: 103 mmol/L (ref 98–111)
Creatinine, Ser: 1.1 mg/dL — ABNORMAL HIGH (ref 0.44–1.00)
GFR, Estimated: 52 mL/min — ABNORMAL LOW (ref >60)
Glucose, Bld: 181 mg/dL — ABNORMAL HIGH (ref 70–99)
Potassium: 4.7 mmol/L (ref 3.5–5.1)
Sodium: 137 mmol/L (ref 135–145)

## 2020-07-28 MED ORDER — LINAGLIPTIN 5 MG PO TABS
5.0000 mg | ORAL_TABLET | Freq: Two times a day (BID) | ORAL | Status: DC
  Administered 2020-07-28 – 2020-08-02 (×11): 5 mg via ORAL
  Filled 2020-07-28 (×12): qty 1

## 2020-07-28 MED ORDER — METFORMIN HCL 500 MG PO TABS: 500.0000 mg | ORAL_TABLET | Freq: Two times a day (BID) | ORAL | Status: DC

## 2020-07-28 MED ORDER — SODIUM CHLORIDE 0.9 % IV BOLUS
500.0000 mL | Freq: Once | INTRAVENOUS | Status: AC
  Administered 2020-07-28: 500 mL via INTRAVENOUS

## 2020-07-28 MED ORDER — METFORMIN HCL 500 MG PO TABS
500.0000 mg | ORAL_TABLET | Freq: Two times a day (BID) | ORAL | Status: DC
Start: 2020-07-28 — End: 2020-08-02
  Administered 2020-07-28 – 2020-08-02 (×10): 500 mg via ORAL
  Filled 2020-07-28 (×11): qty 1

## 2020-07-28 NOTE — Progress Notes (Signed)
PROGRESS NOTE    Lashon Beringer  XBL:[id number] DOB: Jun 19, 1945 DOA: 07/24/2020 PCP: Mechele Claude, FNP    Brief Narrative:  Johnathon Mittal is a 75 year old female who was brought to the ER by EMS for evaluation of weakness and found to be in DKA.  Patient was started on an insulin drip and will be admitted to the hospital for further evaluation.   Subjective: No new complaints feels better today tolerating her diet.  Objective: Vitals:   07/27/20 1922 07/28/20 0105 07/28/20 0317 07/28/20 0754  BP: 128/83 136/73 120/69 120/79  Pulse: 65 65 64 70  Resp: 16 18 16 16   Temp: 97.8 F (36.6 C) 97.8 F (36.6 C) 97.6 F (36.4 C) 97.9 F (36.6 C)  TempSrc: Oral  Oral   SpO2: 95% 93% 90% 95%  Weight:      Height:        Intake/Output Summary (Last 24 hours) at 07/28/2020 0909 Last data filed at 07/27/2020 2253 Gross per 24 hour  Intake 0 ml  Output 1200 ml  Net -1200 ml   Filed Weights   07/24/20 1400  Weight: 78.6 kg    Examination: General exam: In no acute distress. Respiratory system: Good air movement and clear to auscultation. Cardiovascular system: S1 & S2 heard, RRR. No JVD. Gastrointestinal system: Abdomen is nondistended, soft and nontender.  Extremities: No pedal edema. Skin: No rashes, lesions or ulcers Psychiatry: Judgement and insight appear normal. Mood & affect appropriate.   Data Reviewed: I have personally reviewed following labs and imaging studies  CBC: Recent Labs  Lab 07/24/20 0725  WBC 8.1  NEUTROABS 6.0  HGB 16.2*  HCT 50.0*  MCV 90.3  PLT 300   Basic Metabolic Panel: Recent Labs  Lab 07/24/20 2127 07/25/20 0433 07/26/20 0637 07/27/20 0547 07/28/20 0457  NA 134* 137 137 139 137  K 4.0 4.0 3.3* 4.3 4.7  CL 105 107 105 105 103  CO2 21* 21* 24 26 26   GLUCOSE 286* 190* 197* 166* 181*  BUN 27* 22 17 12 17   CREATININE 0.93 0.93 0.96 0.89 1.10*  CALCIUM 8.4* 8.4* 8.6* 8.7* 9.0   GFR: Estimated Creatinine  Clearance: 42.9 mL/min (A) (by C-G formula based on SCr of 1.1 mg/dL (H)). Liver Function Tests: No results for input(s): AST, ALT, ALKPHOS, BILITOT, PROT, ALBUMIN in the last 168 hours. No results for input(s): LIPASE, AMYLASE in the last 168 hours. No results for input(s): AMMONIA in the last 168 hours. Coagulation Profile: Recent Labs  Lab 07/24/20 0735  INR 1.1   Cardiac Enzymes: Recent Labs  Lab 07/24/20 0725  CKTOTAL 127   BNP (last 3 results) No results for input(s): PROBNP in the last 8760 hours. HbA1C: No results for input(s): HGBA1C in the last 72 hours. CBG: Recent Labs  Lab 07/27/20 0845 07/27/20 1205 07/27/20 1528 07/27/20 2124 07/28/20 0755  GLUCAP 188* 260* 202* 123* 175*   Lipid Profile: No results for input(s): CHOL, HDL, LDLCALC, TRIG, CHOLHDL, LDLDIRECT in the last 72 hours. Thyroid Function Tests: No results for input(s): TSH, T4TOTAL, FREET4, T3FREE, THYROIDAB in the last 72 hours. Anemia Panel: No results for input(s): VITAMINB12, FOLATE, FERRITIN, TIBC, IRON, RETICCTPCT in the last 72 hours. Sepsis Labs: Recent Labs  Lab 07/24/20 0911  LATICACIDVEN 2.0*    Recent Results (from the past 240 hour(s))  SARS CORONAVIRUS 2 (TAT 6-24 HRS) Nasopharyngeal Nasopharyngeal Swab     Status: None   Collection Time: 07/24/20 12:24 PM  Specimen: Nasopharyngeal Swab  Result Value Ref Range Status   SARS Coronavirus 2 NEGATIVE NEGATIVE Final    Comment: (NOTE) SARS-CoV-2 target nucleic acids are NOT DETECTED.  The SARS-CoV-2 RNA is generally detectable in upper and lower respiratory specimens during the acute phase of infection. Negative results do not preclude SARS-CoV-2 infection, do not rule out co-infections with other pathogens, and should not be used as the sole basis for treatment or other patient management decisions. Negative results must be combined with clinical observations, patient history, and epidemiological information. The  expected result is Negative.  Fact Sheet for Patients: SugarRoll.be  Fact Sheet for Healthcare Providers: https://www.woods-mathews.com/  This test is not yet approved or cleared by the [place] FDA and  has been authorized for detection and/or diagnosis of SARS-CoV-2 by FDA under an Emergency Use Authorization (EUA). This EUA will remain  in effect (meaning this test can be used) for the duration of the COVID-19 declaration under Se ction 564(b)(1) of the Act, 21 U.S.C. section 360bbb-3(b)(1), unless the authorization is terminated or revoked sooner.  Performed at [facility], [place] [address], [place], [place] [address]   MRSA PCR Screening     Status: None   Collection Time: 07/24/20  2:30 PM   Specimen: Nasal Mucosa; Nasopharyngeal  Result Value Ref Range Status   MRSA by PCR NEGATIVE NEGATIVE Final    Comment:        The GeneXpert MRSA Assay (FDA approved for NASAL specimens only), is one component of a comprehensive MRSA colonization surveillance program. It is not intended to diagnose MRSA infection nor to guide or monitor treatment for MRSA infections. Performed at [facility], [address] [place], [place] [address]          Radiology Studies: No results found.      Scheduled Meds: . Chlorhexidine Gluconate Cloth  6 each Topical Daily  . clonazePAM  1 mg Oral BID  . divalproex  1,000 mg Oral QHS  . enalapril  5 mg Oral BID  . enoxaparin (LOVENOX) injection  40 mg Subcutaneous Q24H  . FLUoxetine  60 mg Oral Daily  . insulin aspart  0-15 Units Subcutaneous TID WC  . insulin aspart  0-5 Units Subcutaneous QHS  . insulin aspart  4 Units Subcutaneous TID WC  . insulin glargine  10 Units Subcutaneous BID  . levothyroxine  88 mcg Oral QAC breakfast  . loratadine  10 mg Oral Daily  . lurasidone  20 mg Oral Daily  . mouth rinse  15 mL Mouth Rinse BID  . rosuvastatin  40 mg Oral QHS    Continuous Infusions:   Assessment & Plan:   Principal Problem:   DKA (diabetic ketoacidosis) ([place]) Active Problems:   Essential hypertension   Hypothyroidism   Severe bipolar I disorder, current or most recent episode depressed ([place])  DKA Likely due to noncompliance with his medication. KVO IV fluids. Started on Tradjenta and Metformin should continue these as an outpatient. Patient is still awaiting skilled nursing facility placement.   Social worker is working on it. Patient is medically stable for to be transferred.  Essential hypertension Continue to hold ACE inhibitor can be resumed as an outpatient. Blood pressure is improved.  Hypothyroidism Continue Synthroid   Severe bipolar disorder Continue Latuda, fluoxetine, Depakote   DVT prophylaxis: Lovenox Code Status: Full Family Communication: None at bedside  Status is: Inpatient  Remains inpatient appropriate because:Inpatient level of care appropriate due to severity of illness  Dispo: The patient is from: Home              Anticipated d/c is to: SNF              Patient currently is not medically stable to d/c.   Difficult to place patient No    anticipated discharge date 1 to 2 days.  Still on IV fluids due to low blood pressure.           LOS: 4 days   Time spent: 35 minutes    Charlynne Cousins, MD [facility] Pager [phone]  If 7PM-7AM, please contact night-coverage 07/28/2020, 9:09 AM

## 2020-07-28 NOTE — Care Management Important Message (Signed)
Important Message  Patient Details  Name: [name] MRN: [id number] Date of Birth: 01/09/1946   Medicare Important Message Given:  N/A - LOS <3 / Initial given by admissions     Juliann Pulse A [name] 07/28/2020, 9:35 AM

## 2020-07-29 DIAGNOSIS — E1110 Type 2 diabetes mellitus with ketoacidosis without coma: Secondary | ICD-10-CM | POA: Diagnosis not present

## 2020-07-29 LAB — BASIC METABOLIC PANEL
Anion gap: 10 (ref 5–15)
BUN: 19 mg/dL (ref 8–23)
CO2: 25 mmol/L (ref 22–32)
Calcium: 9.4 mg/dL (ref 8.9–10.3)
Chloride: 101 mmol/L (ref 98–111)
Creatinine, Ser: 1.01 mg/dL — ABNORMAL HIGH (ref 0.44–1.00)
GFR, Estimated: 58 mL/min — ABNORMAL LOW (ref >60)
Glucose, Bld: 134 mg/dL — ABNORMAL HIGH (ref 70–99)
Potassium: 4 mmol/L (ref 3.5–5.1)
Sodium: 136 mmol/L (ref 135–145)

## 2020-07-29 LAB — GLUCOSE, CAPILLARY
Glucose-Capillary: 152 mg/dL — ABNORMAL HIGH (ref 70–99)
Glucose-Capillary: 239 mg/dL — ABNORMAL HIGH (ref 70–99)
Glucose-Capillary: 202 mg/dL — ABNORMAL HIGH (ref 70–99)
Glucose-Capillary: 147 mg/dL — ABNORMAL HIGH (ref 70–99)
Glucose-Capillary: 137 mg/dL — ABNORMAL HIGH (ref 70–99)
Glucose-Capillary: 129 mg/dL — ABNORMAL HIGH (ref 70–99)

## 2020-07-29 MED ORDER — INSULIN DETEMIR 100 UNIT/ML ~~LOC~~ SOLN
7.0000 [IU] | Freq: Two times a day (BID) | SUBCUTANEOUS | Status: DC
  Administered 2020-07-29 – 2020-08-02 (×9): 7 [IU] via SUBCUTANEOUS
  Filled 2020-07-29 (×10): qty 0.07

## 2020-07-29 NOTE — TOC Progression Note (Signed)
Transition of Care [facility]) - Progression Note    Patient Details  Name: [name] MRN: [id number] Date of Birth: 07-Dec-1945  Transition of Care [facility]) CM/SW Contact  Truitt Merle, LCSW Phone Number: 07/29/2020, 4:55 PM  Clinical Narrative:    Pasrr is still pending-Level II review. TOC continuing to follow for discharge needs.   Expected Discharge Plan: [place] Barriers to Discharge: Continued Medical Work up  Expected Discharge Plan and Services Expected Discharge Plan: [place]   Discharge Planning Services: CM Consult Post Acute Care Choice: [place] Living arrangements for the past 2 months: Apartment Expected Discharge Date: 07/27/20               DME Arranged: N/A DME Agency: NA       HH Arranged: NA HH Agency: NA         Social Determinants of Health (SDOH) Interventions    Readmission Risk Interventions No flowsheet data found.

## 2020-07-29 NOTE — Progress Notes (Signed)
PROGRESS NOTE    [name]  OQH:[id number] DOB: [birth year] DOA: 07/24/2020 PCP: Mechele Claude, FNP    Brief Narrative:  [name] is a 75 year old female who was brought to the ER by EMS for evaluation of weakness and found to be in DKA.  Patient was started on an insulin drip and will be admitted to the hospital for further evaluation.   Subjective: No new complaints tolerating her diet.  Objective: Vitals:   07/28/20 2024 07/29/20 0054 07/29/20 0539 07/29/20 0727  BP: 117/82 137/83 119/81 118/72  Pulse: 74 73 73 71  Resp: 16 16 16 18   Temp: 98.2 F (36.8 C) 98.6 F (37 C) 98.1 F (36.7 C) 98.1 F (36.7 C)  TempSrc: Oral Oral Oral Oral  SpO2: 94% 91% 92% 95%  Weight:      Height:        Intake/Output Summary (Last 24 hours) at 07/29/2020 1016 Last data filed at 07/29/2020 0400 Gross per 24 hour  Intake 240 ml  Output 1900 ml  Net -1660 ml   Filed Weights   07/24/20 1400  Weight: 78.6 kg    Examination: General exam: In no acute distress. Respiratory system: Good air movement and clear to auscultation. Cardiovascular system: S1 & S2 heard, RRR. No JVD. Gastrointestinal system: Abdomen is nondistended, soft and nontender.  Extremities: No pedal edema. Skin: No rashes, lesions or ulcers Psychiatry: Judgement and insight appear normal. Mood & affect appropriate.   Data Reviewed: I have personally reviewed following labs and imaging studies  CBC: Recent Labs  Lab 07/24/20 0725  WBC 8.1  NEUTROABS 6.0  HGB 16.2*  HCT 50.0*  MCV 90.3  PLT 546   Basic Metabolic Panel: Recent Labs  Lab 07/25/20 0433 07/26/20 0637 07/27/20 0547 07/28/20 0457 07/29/20 0455  NA 137 137 139 137 136  K 4.0 3.3* 4.3 4.7 4.0  CL 107 105 105 103 101  CO2 21* 24 26 26 25   GLUCOSE 190* 197* 166* 181* 134*  BUN 22 17 12 17 19   CREATININE 0.93 0.96 0.89 1.10* 1.01*  CALCIUM 8.4* 8.6* 8.7* 9.0 9.4   GFR: Estimated Creatinine Clearance: 46.7  mL/min (A) (by C-G formula based on SCr of 1.01 mg/dL (H)). Liver Function Tests: No results for input(s): AST, ALT, ALKPHOS, BILITOT, PROT, ALBUMIN in the last 168 hours. No results for input(s): LIPASE, AMYLASE in the last 168 hours. No results for input(s): AMMONIA in the last 168 hours. Coagulation Profile: Recent Labs  Lab 07/24/20 0735  INR 1.1   Cardiac Enzymes: Recent Labs  Lab 07/24/20 0725  CKTOTAL 127   BNP (last 3 results) No results for input(s): PROBNP in the last 8760 hours. HbA1C: No results for input(s): HGBA1C in the last 72 hours. CBG: Recent Labs  Lab 07/28/20 1218 07/28/20 1620 07/28/20 2025 07/29/20 0759 07/29/20 0937  GLUCAP 201* 188* 156* 152* 239*   Lipid Profile: No results for input(s): CHOL, HDL, LDLCALC, TRIG, CHOLHDL, LDLDIRECT in the last 72 hours. Thyroid Function Tests: No results for input(s): TSH, T4TOTAL, FREET4, T3FREE, THYROIDAB in the last 72 hours. Anemia Panel: No results for input(s): VITAMINB12, FOLATE, FERRITIN, TIBC, IRON, RETICCTPCT in the last 72 hours. Sepsis Labs: Recent Labs  Lab 07/24/20 0911  LATICACIDVEN 2.0*    Recent Results (from the past 240 hour(s))  SARS CORONAVIRUS 2 (TAT 6-24 HRS) Nasopharyngeal Nasopharyngeal Swab     Status: None   Collection Time: 07/24/20 12:24 PM   Specimen: Nasopharyngeal Swab  Result Value Ref Range Status   SARS Coronavirus 2 NEGATIVE NEGATIVE Final    Comment: (NOTE) SARS-CoV-2 target nucleic acids are NOT DETECTED.  The SARS-CoV-2 RNA is generally detectable in upper and lower respiratory specimens during the acute phase of infection. Negative results do not preclude SARS-CoV-2 infection, do not rule out co-infections with other pathogens, and should not be used as the sole basis for treatment or other patient management decisions. Negative results must be combined with clinical observations, patient history, and epidemiological information. The expected result is  Negative.  Fact Sheet for Patients: SugarRoll.be  Fact Sheet for Healthcare Providers: https://www.woods-mathews.com/  This test is not yet approved or cleared by the [place] FDA and  has been authorized for detection and/or diagnosis of SARS-CoV-2 by FDA under an Emergency Use Authorization (EUA). This EUA will remain  in effect (meaning this test can be used) for the duration of the COVID-19 declaration under Se ction 564(b)(1) of the Act, 21 U.S.C. section 360bbb-3(b)(1), unless the authorization is terminated or revoked sooner.  Performed at [facility], [place] [address], [place], [place] [address]   MRSA PCR Screening     Status: None   Collection Time: 07/24/20  2:30 PM   Specimen: Nasal Mucosa; Nasopharyngeal  Result Value Ref Range Status   MRSA by PCR NEGATIVE NEGATIVE Final    Comment:        The GeneXpert MRSA Assay (FDA approved for NASAL specimens only), is one component of a comprehensive MRSA colonization surveillance program. It is not intended to diagnose MRSA infection nor to guide or monitor treatment for MRSA infections. Performed at [facility], [place] [place], [place] [address]   Resp Panel by RT-PCR (Flu A&B, Covid) Nasopharyngeal Swab     Status: None   Collection Time: 07/28/20  5:20 PM   Specimen: Nasopharyngeal Swab; Nasopharyngeal(NP) swabs in vial transport medium  Result Value Ref Range Status   SARS Coronavirus 2 by RT PCR NEGATIVE NEGATIVE Final    Comment: (NOTE) SARS-CoV-2 target nucleic acids are NOT DETECTED.  The SARS-CoV-2 RNA is generally detectable in upper respiratory specimens during the acute phase of infection. The lowest concentration of SARS-CoV-2 viral copies this assay can detect is 138 copies/mL. A negative result does not preclude SARS-Cov-2 infection and should not be used as the sole basis for treatment or other patient management decisions. A  negative result may occur with  improper specimen collection/handling, submission of specimen other than nasopharyngeal swab, presence of viral mutation(s) within the areas targeted by this assay, and inadequate number of viral copies(<138 copies/mL). A negative result must be combined with clinical observations, patient history, and epidemiological information. The expected result is Negative.  Fact Sheet for Patients:  EntrepreneurPulse.com.au  Fact Sheet for Healthcare Providers:  IncredibleEmployment.be  This test is no t yet approved or cleared by the [place] FDA and  has been authorized for detection and/or diagnosis of SARS-CoV-2 by FDA under an Emergency Use Authorization (EUA). This EUA will remain  in effect (meaning this test can be used) for the duration of the COVID-19 declaration under Section 564(b)(1) of the Act, 21 U.S.C.section 360bbb-3(b)(1), unless the authorization is terminated  or revoked sooner.       Influenza A by PCR NEGATIVE NEGATIVE Final   Influenza B by PCR NEGATIVE NEGATIVE Final    Comment: (NOTE) The Xpert Xpress SARS-CoV-2/FLU/RSV plus assay is intended as an aid in the diagnosis of influenza from Nasopharyngeal swab  specimens and should not be used as a sole basis for treatment. Nasal washings and aspirates are unacceptable for Xpert Xpress SARS-CoV-2/FLU/RSV testing.  Fact Sheet for Patients: EntrepreneurPulse.com.au  Fact Sheet for Healthcare Providers: IncredibleEmployment.be  This test is not yet approved or cleared by the [place] FDA and has been authorized for detection and/or diagnosis of SARS-CoV-2 by FDA under an Emergency Use Authorization (EUA). This EUA will remain in effect (meaning this test can be used) for the duration of the COVID-19 declaration under Section 564(b)(1) of the Act, 21 U.S.C. section 360bbb-3(b)(1), unless the authorization  is terminated or revoked.  Performed at [facility], [address] [place], [place] [address]          Radiology Studies: No results found.      Scheduled Meds: . Chlorhexidine Gluconate Cloth  6 each Topical Daily  . clonazePAM  1 mg Oral BID  . divalproex  1,000 mg Oral QHS  . enalapril  5 mg Oral BID  . enoxaparin (LOVENOX) injection  40 mg Subcutaneous Q24H  . FLUoxetine  60 mg Oral Daily  . insulin aspart  0-15 Units Subcutaneous TID WC  . insulin aspart  0-5 Units Subcutaneous QHS  . insulin aspart  4 Units Subcutaneous TID WC  . levothyroxine  88 mcg Oral QAC breakfast  . linagliptin  5 mg Oral BID  . loratadine  10 mg Oral Daily  . lurasidone  20 mg Oral Daily  . mouth rinse  15 mL Mouth Rinse BID  . metFORMIN  500 mg Oral BID WC  . rosuvastatin  40 mg Oral QHS   Continuous Infusions:   Assessment & Plan:   Principal Problem:   DKA (diabetic ketoacidosis) ([place]) Active Problems:   Essential hypertension   Hypothyroidism   Severe bipolar I disorder, current or most recent episode depressed ([place])  DKA Likely due to noncompliance with his medication. KVO IV fluids. Started on Tradjenta and Metformin should continue these as an outpatient. Patient is still awaiting skilled nursing facility placement.   Social worker is working on it. Blood glucose high she was restarted back on her long-acting insulin continue sliding scale and CBGs before meals and at bedtime Patient is medically stable for to be transferred.  Essential hypertension Continue to hold ACE inhibitor can be resumed as an outpatient. Blood pressure is improved.  Hypothyroidism Continue Synthroid  Severe bipolar disorder Continue Latuda, fluoxetine, Depakote   DVT prophylaxis: Lovenox Code Status: Full Family Communication: None at bedside  Status is: Inpatient  Remains inpatient appropriate because:Inpatient level of care appropriate due to severity of  illness   Dispo: The patient is from: Home              Anticipated d/c is to: SNF              Patient currently is not medically stable to d/c.   Difficult to place patient No    anticipated discharge date 1 to 2 days.  Still on IV fluids due to low blood pressure.           LOS: 5 days   Time spent: 35 minutes    Charlynne Cousins, MD [facility] Pager [phone]  If 7PM-7AM, please contact night-coverage 07/29/2020, 10:16 AM

## 2020-07-30 DIAGNOSIS — E1110 Type 2 diabetes mellitus with ketoacidosis without coma: Secondary | ICD-10-CM | POA: Diagnosis not present

## 2020-07-30 LAB — GLUCOSE, CAPILLARY
Glucose-Capillary: 113 mg/dL — ABNORMAL HIGH (ref 70–99)
Glucose-Capillary: 123 mg/dL — ABNORMAL HIGH (ref 70–99)
Glucose-Capillary: 144 mg/dL — ABNORMAL HIGH (ref 70–99)
Glucose-Capillary: 103 mg/dL — ABNORMAL HIGH (ref 70–99)

## 2020-07-30 LAB — RESP PANEL BY RT-PCR (FLU A&B, COVID) ARPGX2
Influenza A by PCR: NEGATIVE
Influenza B by PCR: NEGATIVE
SARS Coronavirus 2 by RT PCR: NEGATIVE

## 2020-07-30 NOTE — Progress Notes (Signed)
PROGRESS NOTE    Jovana Rembold  VWU:[id number] DOB: Sep 22, 1945 DOA: 07/24/2020 PCP: Mechele Claude, FNP    Brief Narrative:  Kathy Wahid is a 75 year old female who was brought to the ER by EMS for evaluation of weakness and found to be in DKA.  Patient was started on an insulin drip and will be admitted to the hospital for further evaluation.   Subjective: No new complaints tolerating her diet.  Objective: Vitals:   07/29/20 1602 07/29/20 2000 07/30/20 0504 07/30/20 0806  BP: 94/66 116/72 113/71 118/79  Pulse: 72 71 71 76  Resp: 18 16 16 16   Temp: 97.8 F (36.6 C) 99 F (37.2 C) (!) 97.5 F (36.4 C) 97.9 F (36.6 C)  TempSrc: Oral Oral Oral   SpO2: 92% 92% 90% 91%  Weight:      Height:        Intake/Output Summary (Last 24 hours) at 07/30/2020 0924 Last data filed at 07/29/2020 2004 Gross per 24 hour  Intake 240 ml  Output 1275 ml  Net -1035 ml   Filed Weights   07/24/20 1400  Weight: 78.6 kg    Examination: General exam: In no acute distress. Respiratory system: Good air movement and clear to auscultation. Cardiovascular system: S1 & S2 heard, RRR. No JVD. Gastrointestinal system: Abdomen is nondistended, soft and nontender.  Extremities: No pedal edema. Skin: No rashes, lesions or ulcers Psychiatry: Judgement and insight appear normal. Mood & affect appropriate.   Data Reviewed: I have personally reviewed following labs and imaging studies  CBC: Recent Labs  Lab 07/24/20 0725  WBC 8.1  NEUTROABS 6.0  HGB 16.2*  HCT 50.0*  MCV 90.3  PLT 295   Basic Metabolic Panel: Recent Labs  Lab 07/25/20 0433 07/26/20 0637 07/27/20 0547 07/28/20 0457 07/29/20 0455  NA 137 137 139 137 136  K 4.0 3.3* 4.3 4.7 4.0  CL 107 105 105 103 101  CO2 21* 24 26 26 25   GLUCOSE 190* 197* 166* 181* 134*  BUN 22 17 12 17 19   CREATININE 0.93 0.96 0.89 1.10* 1.01*  CALCIUM 8.4* 8.6* 8.7* 9.0 9.4   GFR: Estimated Creatinine Clearance: 46.7  mL/min (A) (by C-G formula based on SCr of 1.01 mg/dL (H)). Liver Function Tests: No results for input(s): AST, ALT, ALKPHOS, BILITOT, PROT, ALBUMIN in the last 168 hours. No results for input(s): LIPASE, AMYLASE in the last 168 hours. No results for input(s): AMMONIA in the last 168 hours. Coagulation Profile: Recent Labs  Lab 07/24/20 0735  INR 1.1   Cardiac Enzymes: Recent Labs  Lab 07/24/20 0725  CKTOTAL 127   BNP (last 3 results) No results for input(s): PROBNP in the last 8760 hours. HbA1C: No results for input(s): HGBA1C in the last 72 hours. CBG: Recent Labs  Lab 07/29/20 1202 07/29/20 1600 07/29/20 1747 07/29/20 2002 07/30/20 0807  GLUCAP 202* 147* 137* 129* 113*   Lipid Profile: No results for input(s): CHOL, HDL, LDLCALC, TRIG, CHOLHDL, LDLDIRECT in the last 72 hours. Thyroid Function Tests: No results for input(s): TSH, T4TOTAL, FREET4, T3FREE, THYROIDAB in the last 72 hours. Anemia Panel: No results for input(s): VITAMINB12, FOLATE, FERRITIN, TIBC, IRON, RETICCTPCT in the last 72 hours. Sepsis Labs: Recent Labs  Lab 07/24/20 0911  LATICACIDVEN 2.0*    Recent Results (from the past 240 hour(s))  SARS CORONAVIRUS 2 (TAT 6-24 HRS) Nasopharyngeal Nasopharyngeal Swab     Status: None   Collection Time: 07/24/20 12:24 PM   Specimen: Nasopharyngeal  Swab  Result Value Ref Range Status   SARS Coronavirus 2 NEGATIVE NEGATIVE Final    Comment: (NOTE) SARS-CoV-2 target nucleic acids are NOT DETECTED.  The SARS-CoV-2 RNA is generally detectable in upper and lower respiratory specimens during the acute phase of infection. Negative results do not preclude SARS-CoV-2 infection, do not rule out co-infections with other pathogens, and should not be used as the sole basis for treatment or other patient management decisions. Negative results must be combined with clinical observations, patient history, and epidemiological information. The expected result is  Negative.  Fact Sheet for Patients: SugarRoll.be  Fact Sheet for Healthcare Providers: https://www.woods-mathews.com/  This test is not yet approved or cleared by the [place] FDA and  has been authorized for detection and/or diagnosis of SARS-CoV-2 by FDA under an Emergency Use Authorization (EUA). This EUA will remain  in effect (meaning this test can be used) for the duration of the COVID-19 declaration under Se ction 564(b)(1) of the Act, 21 U.S.C. section 360bbb-3(b)(1), unless the authorization is terminated or revoked sooner.  Performed at [facility], [place] [address] [place], [place] [address]   MRSA PCR Screening     Status: None   Collection Time: 07/24/20  2:30 PM   Specimen: Nasal Mucosa; Nasopharyngeal  Result Value Ref Range Status   MRSA by PCR NEGATIVE NEGATIVE Final    Comment:        The GeneXpert MRSA Assay (FDA approved for NASAL specimens only), is one component of a comprehensive MRSA colonization surveillance program. It is not intended to diagnose MRSA infection nor to guide or monitor treatment for MRSA infections. Performed at [facility], [address] [place], [place] [address]   Resp Panel by RT-PCR (Flu A&B, Covid) Nasopharyngeal Swab     Status: None   Collection Time: 07/28/20  5:20 PM   Specimen: Nasopharyngeal Swab; Nasopharyngeal(NP) swabs in vial transport medium  Result Value Ref Range Status   SARS Coronavirus 2 by RT PCR NEGATIVE NEGATIVE Final    Comment: (NOTE) SARS-CoV-2 target nucleic acids are NOT DETECTED.  The SARS-CoV-2 RNA is generally detectable in upper respiratory specimens during the acute phase of infection. The lowest concentration of SARS-CoV-2 viral copies this assay can detect is 138 copies/mL. A negative result does not preclude SARS-Cov-2 infection and should not be used as the sole basis for treatment or other patient management decisions. A  negative result may occur with  improper specimen collection/handling, submission of specimen other than nasopharyngeal swab, presence of viral mutation(s) within the areas targeted by this assay, and inadequate number of viral copies(<138 copies/mL). A negative result must be combined with clinical observations, patient history, and epidemiological information. The expected result is Negative.  Fact Sheet for Patients:  EntrepreneurPulse.com.au  Fact Sheet for Healthcare Providers:  IncredibleEmployment.be  This test is no t yet approved or cleared by the [place] FDA and  has been authorized for detection and/or diagnosis of SARS-CoV-2 by FDA under an Emergency Use Authorization (EUA). This EUA will remain  in effect (meaning this test can be used) for the duration of the COVID-19 declaration under Section 564(b)(1) of the Act, 21 U.S.C.section 360bbb-3(b)(1), unless the authorization is terminated  or revoked sooner.       Influenza A by PCR NEGATIVE NEGATIVE Final   Influenza B by PCR NEGATIVE NEGATIVE Final    Comment: (NOTE) The Xpert Xpress SARS-CoV-2/FLU/RSV plus assay is intended as an aid in the diagnosis of influenza from  Nasopharyngeal swab specimens and should not be used as a sole basis for treatment. Nasal washings and aspirates are unacceptable for Xpert Xpress SARS-CoV-2/FLU/RSV testing.  Fact Sheet for Patients: EntrepreneurPulse.com.au  Fact Sheet for Healthcare Providers: IncredibleEmployment.be  This test is not yet approved or cleared by the [place] FDA and has been authorized for detection and/or diagnosis of SARS-CoV-2 by FDA under an Emergency Use Authorization (EUA). This EUA will remain in effect (meaning this test can be used) for the duration of the COVID-19 declaration under Section 564(b)(1) of the Act, 21 U.S.C. section 360bbb-3(b)(1), unless the authorization  is terminated or revoked.  Performed at [facility], [address], [place], [place] [address]          Radiology Studies: No results found.      Scheduled Meds: . Chlorhexidine Gluconate Cloth  6 each Topical Daily  . clonazePAM  1 mg Oral BID  . divalproex  1,000 mg Oral QHS  . enalapril  5 mg Oral BID  . enoxaparin (LOVENOX) injection  40 mg Subcutaneous Q24H  . FLUoxetine  60 mg Oral Daily  . insulin aspart  0-15 Units Subcutaneous TID WC  . insulin aspart  0-5 Units Subcutaneous QHS  . insulin aspart  4 Units Subcutaneous TID WC  . insulin detemir  7 Units Subcutaneous BID  . levothyroxine  88 mcg Oral QAC breakfast  . linagliptin  5 mg Oral BID  . loratadine  10 mg Oral Daily  . lurasidone  20 mg Oral Daily  . mouth rinse  15 mL Mouth Rinse BID  . metFORMIN  500 mg Oral BID WC  . rosuvastatin  40 mg Oral QHS   Continuous Infusions:   Assessment & Plan:   Principal Problem:   DKA (diabetic ketoacidosis) ([place]) Active Problems:   Essential hypertension   Hypothyroidism   Severe bipolar I disorder, current or most recent episode depressed ([place])  DKA Likely due to noncompliance with his medication. KVO IV fluids. Started on Tradjenta and Metformin should continue these as an outpatient. Patient is still awaiting skilled nursing facility placement.   Social worker is working on it. Blood glucose high she was restarted back on her long-acting insulin continue sliding scale and CBGs before meals and at bedtime Patient is medically stable for to be transferred.  Essential hypertension Continue to hold ACE inhibitor can be resumed as an outpatient. Blood pressure is improved.  Hypothyroidism Continue Synthroid  Severe bipolar disorder Continue Latuda, fluoxetine, Depakote   DVT prophylaxis: Lovenox Code Status: Full Family Communication: None at bedside  Status is: Inpatient  Remains inpatient appropriate because:Inpatient level of  care appropriate due to severity of illness   Dispo: The patient is from: Home              Anticipated d/c is to: SNF              Patient currently is not medically stable to d/c.   Difficult to place patient No    anticipated discharge date 1 to 2 days.  Still on IV fluids due to low blood pressure.           LOS: 6 days   Time spent: 35 minutes    Charlynne Cousins, MD [facility] Pager [phone]  If 7PM-7AM, please contact night-coverage 07/30/2020, 9:24 AM

## 2020-07-31 DIAGNOSIS — I10 Essential (primary) hypertension: Secondary | ICD-10-CM | POA: Diagnosis not present

## 2020-07-31 DIAGNOSIS — E039 Hypothyroidism, unspecified: Secondary | ICD-10-CM | POA: Diagnosis not present

## 2020-07-31 DIAGNOSIS — R9431 Abnormal electrocardiogram [ECG] [EKG]: Secondary | ICD-10-CM | POA: Diagnosis not present

## 2020-07-31 DIAGNOSIS — E1110 Type 2 diabetes mellitus with ketoacidosis without coma: Secondary | ICD-10-CM | POA: Diagnosis not present

## 2020-07-31 LAB — GLUCOSE, CAPILLARY
Glucose-Capillary: 131 mg/dL — ABNORMAL HIGH (ref 70–99)
Glucose-Capillary: 172 mg/dL — ABNORMAL HIGH (ref 70–99)
Glucose-Capillary: 96 mg/dL (ref 70–99)
Glucose-Capillary: 166 mg/dL — ABNORMAL HIGH (ref 70–99)

## 2020-07-31 LAB — CREATININE, SERUM
Creatinine, Ser: 1.52 mg/dL — ABNORMAL HIGH (ref 0.44–1.00)
GFR, Estimated: 36 mL/min — ABNORMAL LOW (ref >60)

## 2020-07-31 MED ORDER — METFORMIN HCL 500 MG PO TABS: 1000.0000 mg | ORAL_TABLET | Freq: Two times a day (BID) | ORAL | Status: DC

## 2020-07-31 MED ORDER — INSULIN DETEMIR 100 UNIT/ML ~~LOC~~ SOLN: 15.0000 [IU] | Freq: Every day | SUBCUTANEOUS | 11 refills | Status: DC

## 2020-07-31 MED ORDER — SIMETHICONE 80 MG PO CHEW
160.0000 mg | CHEWABLE_TABLET | Freq: Four times a day (QID) | ORAL | Status: DC | PRN
  Administered 2020-07-31: 22:00:00 160 mg via ORAL
  Filled 2020-07-31 (×2): qty 2

## 2020-07-31 NOTE — Discharge Instructions (Signed)
Near-Syncope Near-syncope is when you suddenly feel like you might pass out (faint), but you do not actually lose consciousness. This may also be referred to as presyncope. During an episode of near-syncope, you may:  Feel dizzy, weak, or light-headed.  Feel nauseous.  See all white or all black in your field of vision, or see spots.  Have cold, clammy skin. This condition is caused by a sudden decrease in blood flow to the brain. This decrease can result from various causes, but most of those causes are not dangerous. However, near-syncope may be a sign of a serious medical problem, so it is important to seek medical care. Follow these instructions at home: Medicines  Take over-the-counter and prescription medicines only as told by your health care provider.  If you are taking blood pressure or heart medicine, get up slowly and take several minutes to sit and then stand. This can reduce dizziness. General instructions  Pay attention to any changes in your symptoms.  Talk with your health care provider about your symptoms. You may need to have testing to understand the cause of your near-syncope.  If you start to feel like you might faint, lie down right away and raise (elevate) your feet above the level of your heart. Breathe deeply and steadily. Wait until all of the symptoms have passed.  Have someone stay with you until you feel stable.  Do not drive, use machinery, or play sports until your health care provider says it is okay.  Drink enough fluid to keep your urine pale yellow.  Keep all follow-up visits as told by your health care provider. This is important. Get help right away if you:  Have a seizure.  Have unusual pain in your chest, abdomen, or back.  Faint once or repeatedly.  Have a severe headache.  Are bleeding from your mouth or rectum, or you have black or tarry stool.  Have a very fast or irregular heartbeat (palpitations).  Are confused.  Have  trouble walking.  Have severe weakness.  Have vision problems. These symptoms may represent a serious problem that is an emergency. Do not wait to see if your symptoms will go away. Get medical help right away. Call your local emergency services (911 in the [place] Do not drive yourself to the hospital. Summary  Near-syncope is when you suddenly feel like you might pass out (faint), but you do not actually lose consciousness.  This condition is caused by a sudden decrease in blood flow to the brain. This decrease can result from various causes, but most of those causes are not dangerous.  Near-syncope may be a sign of a serious medical problem, so it is important to seek medical care. This information is not intended to replace advice given to you by your health care provider. Make sure you discuss any questions you have with your health care provider. Document Revised: 08/28/2018 Document Reviewed: 03/25/2018 Elsevier Patient Education  2021 Elsevier Inc.  

## 2020-07-31 NOTE — Progress Notes (Signed)
Physical Therapy Treatment Patient Details Name: [name] MRN: [id number] DOB: [birth year] Today's Date: 07/31/2020    History of Present Illness 75 y.o. female with medical history significant for diabetes mellitus on oral hypoglycemic agents, hypertension, hypothyroidism, history of breast cancer and bipolar disorder who presents to the ER via EMS for evaluation of weakness.    PT Comments    Pt is making gradual progress towards goals, limited by weakness and poor balance. High falls risk and currently requires +2 assist for all mobility. Follows commands well and continues to be motivated to participate. R hemibody appears weaker with lateral lean and difficulty with stepping. No sensation/coordination deficits noted. Will continue to progress as able.   Follow Up Recommendations  SNF     Equipment Recommendations  None recommended by PT    Recommendations for Other Services       Precautions / Restrictions Precautions Precautions: Fall Restrictions Weight Bearing Restrictions: No    Mobility  Bed Mobility Overal bed mobility: Needs Assistance Bed Mobility: Supine to Sit;Sit to Supine     Supine to sit: Mod assist     General bed mobility comments: able to initiate moving B LEs off bed, however demonstrates poor trunk mobility and heavy R lateral lean. Once seated at EOB, needs mod assist to maintain upright posture. Sitting balance ther-ex performed with improvement to cga for quiet sitting    Transfers Overall transfer level: Needs assistance Equipment used: Rolling walker (2 wheeled) Transfers: Sit to/from Stand Sit to Stand: Mod assist         General transfer comment: able to practice 2 reps, unable to fully acheive upright posture. 3rd attempt with +2 assist  Ambulation/Gait Ambulation/Gait assistance: Mod assist;+2 physical assistance Gait Distance (Feet): 3 Feet Assistive device: Rolling walker (2 wheeled) Gait Pattern/deviations: Step-to  pattern     General Gait Details: very short step length with increased difficulty moving R LE. Needs weight shift facilitated to assist in stepping. +2 assist.   Stairs             Wheelchair Mobility    Modified Rankin (Stroke Patients Only)       Balance Overall balance assessment: Needs assistance Sitting-balance support: Bilateral upper extremity supported Sitting balance-Leahy Scale: Poor Sitting balance - Comments: R post lateral leaning   Standing balance support: Bilateral upper extremity supported Standing balance-Leahy Scale: Poor Standing balance comment: reliant on RW, difficult to achieve upright                            Cognition Arousal/Alertness: Awake/alert Behavior During Therapy: WFL for tasks assessed/performed Overall Cognitive Status: Within Functional Limits for tasks assessed                                        Exercises Other Exercises Other Exercises: seated balance activities to improve upright posture including lateral weight shifting and ant/post reaching. 5 reps in all directions. Other Exercises: 2 reps for sit<>stand with focus on moving BOS forward to achieve upright posture Other Exercises: seated ther-ex performed on B LE including AP, hip abd/add, LAQ, and alt. marching x 10 reps    General Comments        Pertinent Vitals/Pain Pain Assessment: No/denies pain    Home Living  Prior Function            PT Goals (current goals can now be found in the care plan section) Acute Rehab PT Goals Patient Stated Goal: get back to independent living PT Goal Formulation: With patient Time For Goal Achievement: 08/08/20 Potential to Achieve Goals: Fair Progress towards PT goals: Progressing toward goals    Frequency    Min 2X/week      PT Plan Current plan remains appropriate    Co-evaluation              AM-PAC PT "6 Clicks" Mobility   Outcome  Measure  Help needed turning from your back to your side while in a flat bed without using bedrails?: A Lot Help needed moving from lying on your back to sitting on the side of a flat bed without using bedrails?: A Lot Help needed moving to and from a bed to a chair (including a wheelchair)?: A Lot Help needed standing up from a chair using your arms (e.g., wheelchair or bedside chair)?: A Lot Help needed to walk in hospital room?: Total Help needed climbing 3-5 steps with a railing? : Total 6 Click Score: 10    End of Session Equipment Utilized During Treatment: Gait belt Activity Tolerance: Patient limited by fatigue Patient left: in chair;with chair alarm set Nurse Communication: Mobility status PT Visit Diagnosis: Muscle weakness (generalized) (M62.81);Difficulty in walking, not elsewhere classified (R26.2);History of falling (Z91.81)     Time: 0936-1000 PT Time Calculation (min) (ACUTE ONLY): 24 min  Charges:  $Therapeutic Exercise: 8-22 mins $Neuromuscular Re-education: 8-22 mins                     [name], PT, DPT [phone]    [name] 07/31/2020, 10:50 AM

## 2020-07-31 NOTE — Plan of Care (Signed)

## 2020-07-31 NOTE — TOC Progression Note (Addendum)
Transition of Care [facility]) - Progression Note    Patient Details  Name: [name] MRN: [id number] Date of Birth: 11-Aug-1945  [facility] CM/SW Contact  Anselm Pancoast, RN Phone Number: 07/31/2020, 11:28 AM  Clinical Narrative:    Outreached to [place] [employer] following up on delay with PASRR and delaying discharge.   Spoke to supervisor @ [place] [employer]-confirmed they have 7-9 business days to review PASRR and do not work on weekends. Supervisor states will outreach to reviewer and attempt to expedite as patient is medically ready for discharge.      Expected Discharge Plan: [place] Barriers to Discharge: Continued Medical Work up  Expected Discharge Plan and Services Expected Discharge Plan: [place]   Discharge Planning Services: CM Consult Post Acute Care Choice: [place] Living arrangements for the past 2 months: Apartment Expected Discharge Date: 07/31/20               DME Arranged: N/A DME Agency: NA       HH Arranged: NA HH Agency: NA         Social Determinants of Health (SDOH) Interventions    Readmission Risk Interventions No flowsheet data found.

## 2020-07-31 NOTE — Care Management Important Message (Signed)
Important Message  Patient Details  Name: [name] MRN: [id number] Date of Birth: 1945-10-15   Medicare Important Message Given:  Yes     Juliann Pulse A Perez Dirico 07/31/2020, 11:03 AM

## 2020-07-31 NOTE — Discharge Summary (Addendum)
Physician Discharge Summary  Maedell Hedger TDD:[id number] DOB: 1946/03/30 DOA: 07/24/2020  PCP: Mechele Claude, FNP  Admit date: 07/24/2020 Discharge date: 08/01/2020  Admitted From: Home  Disposition: Skilled nursing facility   Recommendations for Outpatient Follow-up:  1. Follow up with PCP in 1-2 weeks 2. Please obtain BMP/CBC in one week   Home Health: No Equipment/Devices:none  Discharge Condition:Stable CODE STATUS:Full Diet recommendation: Heart Healthy  Brief/Interim Summary: 75 year old female who was brought to the ER by EMS for evaluation of weakness and found to be in DKA. Patient was started on an insulin drip and will be admitted to the hospital for further evaluation.   Discharge Diagnoses:  Principal Problem:   DKA (diabetic ketoacidosis) ([place]) Active Problems:   Essential hypertension   Hypothyroidism   Severe bipolar I disorder, current or most recent episode depressed ([place])  DKA: Likely due to noncompliance with her medications. She was started on IV fluids and IV insulin and her blood glucose improved. She will continue long-acting insulin sliding scale blood glucose remain well controlled. Her A1c was 14. She was switched to oral agents which she will continue as an outpatient no change made to her medication and her blood glucose has been relatively well controlled continue current regimen as an outpatient. Physical therapy evaluated the patient and recommended home health PT.  Central hypertension: Held on admission blood pressure started to trend up she was resumed on her home regimen her blood pressures remain fairly controlled.  Hypothyroidism: Continue Synthroid.  Severe bipolar disorder No changes made to her medication.  Discharge Instructions  Discharge Instructions    Diet - low sodium heart healthy   Complete by: As directed    Diet - low sodium heart healthy   Complete by: As directed    Increase activity slowly    Complete by: As directed    Increase activity slowly   Complete by: As directed      Allergies as of 08/01/2020      Reactions   Navane [thiothixene] Other (See Comments)   Reaction:  Unknown   Penicillins Rash   Has patient had a PCN reaction causing immediate rash, facial/tongue/throat swelling, SOB or lightheadedness with hypotension: No Has patient had a PCN reaction causing severe rash involving mucus membranes or skin necrosis: No Has patient had a PCN reaction that required hospitalization: No Has patient had a PCN reaction occurring within the last 10 years: No If all of the above answers are "NO", then may proceed with Cephalosporin use.      Medication List    STOP taking these medications   simvastatin 20 MG tablet Commonly known as: ZOCOR     TAKE these medications   clonazePAM 1 MG tablet Commonly known as: KLONOPIN Take 1 tablet (1 mg total) by mouth 2 (two) times daily.   divalproex 500 MG 24 hr tablet Commonly known as: DEPAKOTE ER Take 2 tablets (1,000 mg total) by mouth at bedtime.   enalapril 5 MG tablet Commonly known as: VASOTEC Take 1 tablet (5 mg total) by mouth 2 (two) times daily.   FLUoxetine 20 MG capsule Commonly known as: PROZAC Take 60 mg by mouth daily.   glipiZIDE 10 MG 24 hr tablet Commonly known as: GLUCOTROL XL Take 1 tablet (10 mg total) by mouth daily with breakfast.   insulin detemir 100 UNIT/ML injection Commonly known as: LEVEMIR Inject 0.15 mLs (15 Units total) into the skin daily.   Latuda 40 MG Tabs tablet Generic drug:  lurasidone Take 20 mg by mouth daily.   levothyroxine 88 MCG tablet Commonly known as: SYNTHROID Take 88 mcg by mouth daily before breakfast.   linagliptin 5 MG Tabs tablet Commonly known as: TRADJENTA Take 5 mg by mouth 2 (two) times daily.   loratadine 10 MG tablet Commonly known as: CLARITIN Take 1 tablet (10 mg total) by mouth daily.   metFORMIN 500 MG tablet Commonly known as:  GLUCOPHAGE Take 2 tablets (1,000 mg total) by mouth 2 (two) times daily with a meal.   rosuvastatin 40 MG tablet Commonly known as: CRESTOR Take 1 tablet (40 mg total) by mouth daily.       Contact information for after-discharge care    Destination    [facility] .   Service: Skilled Nursing Contact information: [address] [place] [phone]                 Allergies  Allergen Reactions  . Navane [Thiothixene] Other (See Comments)    Reaction:  Unknown  . Penicillins Rash    Has patient had a PCN reaction causing immediate rash, facial/tongue/throat swelling, SOB or lightheadedness with hypotension: No Has patient had a PCN reaction causing severe rash involving mucus membranes or skin necrosis: No Has patient had a PCN reaction that required hospitalization: No Has patient had a PCN reaction occurring within the last 10 years: No If all of the above answers are "NO", then may proceed with Cephalosporin use.     Consultations:  None   Procedures/Studies: CT Head Wo Contrast  Result Date: 07/24/2020 CLINICAL DATA:  Facial trauma.  Fall with occipital swelling. EXAM: CT HEAD WITHOUT CONTRAST TECHNIQUE: Contiguous axial images were obtained from the base of the skull through the vertex without intravenous contrast. COMPARISON:  04/06/2020 FINDINGS: Brain: No evidence of acute infarction, hemorrhage, hydrocephalus, extra-axial collection or mass lesion/mass effect. Central predominant cerebral volume loss. Chronic small vessel ischemia in the deep white matter. Vascular: No hyperdense vessel or unexpected calcification. Skull: Normal. Negative for fracture or focal lesion. Sinuses/Orbits: No acute finding. IMPRESSION: No evidence of intracranial injury. Electronically Signed   By: Monte Fantasia M.D.   On: 07/24/2020 07:59   (Echo, Carotid, EGD, Colonoscopy, ERCP)    Subjective: No new complaints.  Discharge  Exam: Vitals:   08/01/20 0754 08/01/20 1152  BP: 129/75 117/76  Pulse: 70 74  Resp: 16 16  Temp: 98 F (36.7 C) 97.8 F (36.6 C)  SpO2: 93% 94%   Vitals:   07/31/20 2351 08/01/20 0335 08/01/20 0754 08/01/20 1152  BP: 111/75 131/82 129/75 117/76  Pulse: 80 74 70 74  Resp: 16 20 16 16   Temp: 97.9 F (36.6 C) 98 F (36.7 C) 98 F (36.7 C) 97.8 F (36.6 C)  TempSrc: Oral Oral    SpO2: 94% 93% 93% 94%  Weight:      Height:        General: Pt is alert, awake, not in acute distress Cardiovascular: RRR, S1/S2 +, no rubs, no gallops Respiratory: CTA bilaterally, no wheezing, no rhonchi Abdominal: Soft, NT, ND, bowel sounds + Extremities: no edema, no cyanosis    The results of significant diagnostics from this hospitalization (including imaging, microbiology, ancillary and laboratory) are listed below for reference.     Microbiology: Recent Results (from the past 240 hour(s))  SARS CORONAVIRUS 2 (TAT 6-24 HRS) Nasopharyngeal Nasopharyngeal Swab     Status: None   Collection Time: 07/24/20 12:24 PM  Specimen: Nasopharyngeal Swab  Result Value Ref Range Status   SARS Coronavirus 2 NEGATIVE NEGATIVE Final    Comment: (NOTE) SARS-CoV-2 target nucleic acids are NOT DETECTED.  The SARS-CoV-2 RNA is generally detectable in upper and lower respiratory specimens during the acute phase of infection. Negative results do not preclude SARS-CoV-2 infection, do not rule out co-infections with other pathogens, and should not be used as the sole basis for treatment or other patient management decisions. Negative results must be combined with clinical observations, patient history, and epidemiological information. The expected result is Negative.  Fact Sheet for Patients: SugarRoll.be  Fact Sheet for Healthcare Providers: https://www.woods-mathews.com/  This test is not yet approved or cleared by the [place] FDA and  has been  authorized for detection and/or diagnosis of SARS-CoV-2 by FDA under an Emergency Use Authorization (EUA). This EUA will remain  in effect (meaning this test can be used) for the duration of the COVID-19 declaration under Se ction 564(b)(1) of the Act, 21 U.S.C. section 360bbb-3(b)(1), unless the authorization is terminated or revoked sooner.  Performed at [facility], [place] [address] [place], [place] [address]   MRSA PCR Screening     Status: None   Collection Time: 07/24/20  2:30 PM   Specimen: Nasal Mucosa; Nasopharyngeal  Result Value Ref Range Status   MRSA by PCR NEGATIVE NEGATIVE Final    Comment:        The GeneXpert MRSA Assay (FDA approved for NASAL specimens only), is one component of a comprehensive MRSA colonization surveillance program. It is not intended to diagnose MRSA infection nor to guide or monitor treatment for MRSA infections. Performed at [facility], [address] [place], [place] [address]   Resp Panel by RT-PCR (Flu A&B, Covid) Nasopharyngeal Swab     Status: None   Collection Time: 07/28/20  5:20 PM   Specimen: Nasopharyngeal Swab; Nasopharyngeal(NP) swabs in vial transport medium  Result Value Ref Range Status   SARS Coronavirus 2 by RT PCR NEGATIVE NEGATIVE Final    Comment: (NOTE) SARS-CoV-2 target nucleic acids are NOT DETECTED.  The SARS-CoV-2 RNA is generally detectable in upper respiratory specimens during the acute phase of infection. The lowest concentration of SARS-CoV-2 viral copies this assay can detect is 138 copies/mL. A negative result does not preclude SARS-Cov-2 infection and should not be used as the sole basis for treatment or other patient management decisions. A negative result may occur with  improper specimen collection/handling, submission of specimen other than nasopharyngeal swab, presence of viral mutation(s) within the areas targeted by this assay, and inadequate number of viral copies(<138  copies/mL). A negative result must be combined with clinical observations, patient history, and epidemiological information. The expected result is Negative.  Fact Sheet for Patients:  EntrepreneurPulse.com.au  Fact Sheet for Healthcare Providers:  IncredibleEmployment.be  This test is no t yet approved or cleared by the [place] FDA and  has been authorized for detection and/or diagnosis of SARS-CoV-2 by FDA under an Emergency Use Authorization (EUA). This EUA will remain  in effect (meaning this test can be used) for the duration of the COVID-19 declaration under Section 564(b)(1) of the Act, 21 U.S.C.section 360bbb-3(b)(1), unless the authorization is terminated  or revoked sooner.       Influenza A by PCR NEGATIVE NEGATIVE Final   Influenza B by PCR NEGATIVE NEGATIVE Final    Comment: (NOTE) The Xpert Xpress SARS-CoV-2/FLU/RSV plus assay is intended as an aid in the diagnosis of  influenza from Nasopharyngeal swab specimens and should not be used as a sole basis for treatment. Nasal washings and aspirates are unacceptable for Xpert Xpress SARS-CoV-2/FLU/RSV testing.  Fact Sheet for Patients: EntrepreneurPulse.com.au  Fact Sheet for Healthcare Providers: IncredibleEmployment.be  This test is not yet approved or cleared by the [place] FDA and has been authorized for detection and/or diagnosis of SARS-CoV-2 by FDA under an Emergency Use Authorization (EUA). This EUA will remain in effect (meaning this test can be used) for the duration of the COVID-19 declaration under Section 564(b)(1) of the Act, 21 U.S.C. section 360bbb-3(b)(1), unless the authorization is terminated or revoked.  Performed at [facility], [place], [place] [address]   Resp Panel by RT-PCR (Flu A&B, Covid) Nasopharyngeal Swab     Status: None   Collection Time: 07/30/20  3:05 PM   Specimen:  Nasopharyngeal Swab; Nasopharyngeal(NP) swabs in vial transport medium  Result Value Ref Range Status   SARS Coronavirus 2 by RT PCR NEGATIVE NEGATIVE Final    Comment: (NOTE) SARS-CoV-2 target nucleic acids are NOT DETECTED.  The SARS-CoV-2 RNA is generally detectable in upper respiratory specimens during the acute phase of infection. The lowest concentration of SARS-CoV-2 viral copies this assay can detect is 138 copies/mL. A negative result does not preclude SARS-Cov-2 infection and should not be used as the sole basis for treatment or other patient management decisions. A negative result may occur with  improper specimen collection/handling, submission of specimen other than nasopharyngeal swab, presence of viral mutation(s) within the areas targeted by this assay, and inadequate number of viral copies(<138 copies/mL). A negative result must be combined with clinical observations, patient history, and epidemiological information. The expected result is Negative.  Fact Sheet for Patients:  EntrepreneurPulse.com.au  Fact Sheet for Healthcare Providers:  IncredibleEmployment.be  This test is no t yet approved or cleared by the [place] FDA and  has been authorized for detection and/or diagnosis of SARS-CoV-2 by FDA under an Emergency Use Authorization (EUA). This EUA will remain  in effect (meaning this test can be used) for the duration of the COVID-19 declaration under Section 564(b)(1) of the Act, 21 U.S.C.section 360bbb-3(b)(1), unless the authorization is terminated  or revoked sooner.       Influenza A by PCR NEGATIVE NEGATIVE Final   Influenza B by PCR NEGATIVE NEGATIVE Final    Comment: (NOTE) The Xpert Xpress SARS-CoV-2/FLU/RSV plus assay is intended as an aid in the diagnosis of influenza from Nasopharyngeal swab specimens and should not be used as a sole basis for treatment. Nasal washings and aspirates are unacceptable for  Xpert Xpress SARS-CoV-2/FLU/RSV testing.  Fact Sheet for Patients: EntrepreneurPulse.com.au  Fact Sheet for Healthcare Providers: IncredibleEmployment.be  This test is not yet approved or cleared by the [place] FDA and has been authorized for detection and/or diagnosis of SARS-CoV-2 by FDA under an Emergency Use Authorization (EUA). This EUA will remain in effect (meaning this test can be used) for the duration of the COVID-19 declaration under Section 564(b)(1) of the Act, 21 U.S.C. section 360bbb-3(b)(1), unless the authorization is terminated or revoked.  Performed at [facility], [address] [place], [place] [address]      Labs: BNP (last 3 results) Recent Labs    03/01/20 1312  BNP 16.0   Basic Metabolic Panel: Recent Labs  Lab 07/26/20 0637 07/27/20 0547 07/28/20 0457 07/29/20 0455 07/31/20 0457  NA 137 139 137 136  --   K 3.3* 4.3 4.7  4.0  --   CL 105 105 103 101  --   CO2 24 26 26 25   --   GLUCOSE 197* 166* 181* 134*  --   BUN 17 12 17 19   --   CREATININE 0.96 0.89 1.10* 1.01* 1.52*  CALCIUM 8.6* 8.7* 9.0 9.4  --    Liver Function Tests: No results for input(s): AST, ALT, ALKPHOS, BILITOT, PROT, ALBUMIN in the last 168 hours. No results for input(s): LIPASE, AMYLASE in the last 168 hours. No results for input(s): AMMONIA in the last 168 hours. CBC: No results for input(s): WBC, NEUTROABS, HGB, HCT, MCV, PLT in the last 168 hours. Cardiac Enzymes: No results for input(s): CKTOTAL, CKMB, CKMBINDEX, TROPONINI in the last 168 hours. BNP: Invalid input(s): POCBNP CBG: Recent Labs  Lab 07/31/20 1606 07/31/20 2101 08/01/20 0650 08/01/20 0754 08/01/20 1159  GLUCAP 96 166* 110* 127* 139*   D-Dimer No results for input(s): DDIMER in the last 72 hours. Hgb A1c No results for input(s): HGBA1C in the last 72 hours. Lipid Profile No results for input(s): CHOL, HDL, LDLCALC, TRIG, CHOLHDL,  LDLDIRECT in the last 72 hours. Thyroid function studies No results for input(s): TSH, T4TOTAL, T3FREE, THYROIDAB in the last 72 hours.  Invalid input(s): FREET3 Anemia work up No results for input(s): VITAMINB12, FOLATE, FERRITIN, TIBC, IRON, RETICCTPCT in the last 72 hours. Urinalysis    Component Value Date/Time   COLORURINE YELLOW (A) 07/24/2020 1041   APPEARANCEUR CLEAR (A) 07/24/2020 1041   APPEARANCEUR Clear 02/24/2017 1050   LABSPEC 1.027 07/24/2020 1041   LABSPEC 1.018 02/25/2014 0927   PHURINE 5.0 07/24/2020 1041   GLUCOSEU >=500 (A) 07/24/2020 1041   GLUCOSEU Negative 02/25/2014 0927   HGBUR SMALL (A) 07/24/2020 1041   BILIRUBINUR NEGATIVE 07/24/2020 1041   BILIRUBINUR Negative 02/24/2017 1050   BILIRUBINUR Negative 02/25/2014 0927   KETONESUR 80 (A) 07/24/2020 1041   PROTEINUR NEGATIVE 07/24/2020 1041   NITRITE NEGATIVE 07/24/2020 1041   LEUKOCYTESUR NEGATIVE 07/24/2020 1041   LEUKOCYTESUR 3+ 02/25/2014 0927   Sepsis Labs Invalid input(s): PROCALCITONIN,  WBC,  LACTICIDVEN Microbiology Recent Results (from the past 240 hour(s))  SARS CORONAVIRUS 2 (TAT 6-24 HRS) Nasopharyngeal Nasopharyngeal Swab     Status: None   Collection Time: 07/24/20 12:24 PM   Specimen: Nasopharyngeal Swab  Result Value Ref Range Status   SARS Coronavirus 2 NEGATIVE NEGATIVE Final    Comment: (NOTE) SARS-CoV-2 target nucleic acids are NOT DETECTED.  The SARS-CoV-2 RNA is generally detectable in upper and lower respiratory specimens during the acute phase of infection. Negative results do not preclude SARS-CoV-2 infection, do not rule out co-infections with other pathogens, and should not be used as the sole basis for treatment or other patient management decisions. Negative results must be combined with clinical observations, patient history, and epidemiological information. The expected result is Negative.  Fact Sheet for  Patients: SugarRoll.be  Fact Sheet for Healthcare Providers: https://www.woods-mathews.com/  This test is not yet approved or cleared by the [place] FDA and  has been authorized for detection and/or diagnosis of SARS-CoV-2 by FDA under an Emergency Use Authorization (EUA). This EUA will remain  in effect (meaning this test can be used) for the duration of the COVID-19 declaration under Se ction 564(b)(1) of the Act, 21 U.S.C. section 360bbb-3(b)(1), unless the authorization is terminated or revoked sooner.  Performed at [facility], [place] [address] [place], [place] [address]   MRSA PCR Screening     Status:  None   Collection Time: 07/24/20  2:30 PM   Specimen: Nasal Mucosa; Nasopharyngeal  Result Value Ref Range Status   MRSA by PCR NEGATIVE NEGATIVE Final    Comment:        The GeneXpert MRSA Assay (FDA approved for NASAL specimens only), is one component of a comprehensive MRSA colonization surveillance program. It is not intended to diagnose MRSA infection nor to guide or monitor treatment for MRSA infections. Performed at [facility], [address] [place], [place] [address]   Resp Panel by RT-PCR (Flu A&B, Covid) Nasopharyngeal Swab     Status: None   Collection Time: 07/28/20  5:20 PM   Specimen: Nasopharyngeal Swab; Nasopharyngeal(NP) swabs in vial transport medium  Result Value Ref Range Status   SARS Coronavirus 2 by RT PCR NEGATIVE NEGATIVE Final    Comment: (NOTE) SARS-CoV-2 target nucleic acids are NOT DETECTED.  The SARS-CoV-2 RNA is generally detectable in upper respiratory specimens during the acute phase of infection. The lowest concentration of SARS-CoV-2 viral copies this assay can detect is 138 copies/mL. A negative result does not preclude SARS-Cov-2 infection and should not be used as the sole basis for treatment or other patient management decisions. A negative result may occur with   improper specimen collection/handling, submission of specimen other than nasopharyngeal swab, presence of viral mutation(s) within the areas targeted by this assay, and inadequate number of viral copies(<138 copies/mL). A negative result must be combined with clinical observations, patient history, and epidemiological information. The expected result is Negative.  Fact Sheet for Patients:  EntrepreneurPulse.com.au  Fact Sheet for Healthcare Providers:  IncredibleEmployment.be  This test is no t yet approved or cleared by the [place] FDA and  has been authorized for detection and/or diagnosis of SARS-CoV-2 by FDA under an Emergency Use Authorization (EUA). This EUA will remain  in effect (meaning this test can be used) for the duration of the COVID-19 declaration under Section 564(b)(1) of the Act, 21 U.S.C.section 360bbb-3(b)(1), unless the authorization is terminated  or revoked sooner.       Influenza A by PCR NEGATIVE NEGATIVE Final   Influenza B by PCR NEGATIVE NEGATIVE Final    Comment: (NOTE) The Xpert Xpress SARS-CoV-2/FLU/RSV plus assay is intended as an aid in the diagnosis of influenza from Nasopharyngeal swab specimens and should not be used as a sole basis for treatment. Nasal washings and aspirates are unacceptable for Xpert Xpress SARS-CoV-2/FLU/RSV testing.  Fact Sheet for Patients: EntrepreneurPulse.com.au  Fact Sheet for Healthcare Providers: IncredibleEmployment.be  This test is not yet approved or cleared by the [place] FDA and has been authorized for detection and/or diagnosis of SARS-CoV-2 by FDA under an Emergency Use Authorization (EUA). This EUA will remain in effect (meaning this test can be used) for the duration of the COVID-19 declaration under Section 564(b)(1) of the Act, 21 U.S.C. section 360bbb-3(b)(1), unless the authorization is terminated  or revoked.  Performed at [facility], [place], [place] [address]   Resp Panel by RT-PCR (Flu A&B, Covid) Nasopharyngeal Swab     Status: None   Collection Time: 07/30/20  3:05 PM   Specimen: Nasopharyngeal Swab; Nasopharyngeal(NP) swabs in vial transport medium  Result Value Ref Range Status   SARS Coronavirus 2 by RT PCR NEGATIVE NEGATIVE Final    Comment: (NOTE) SARS-CoV-2 target nucleic acids are NOT DETECTED.  The SARS-CoV-2 RNA is generally detectable in upper respiratory specimens during the acute phase of infection. The lowest concentration  of SARS-CoV-2 viral copies this assay can detect is 138 copies/mL. A negative result does not preclude SARS-Cov-2 infection and should not be used as the sole basis for treatment or other patient management decisions. A negative result may occur with  improper specimen collection/handling, submission of specimen other than nasopharyngeal swab, presence of viral mutation(s) within the areas targeted by this assay, and inadequate number of viral copies(<138 copies/mL). A negative result must be combined with clinical observations, patient history, and epidemiological information. The expected result is Negative.  Fact Sheet for Patients:  EntrepreneurPulse.com.au  Fact Sheet for Healthcare Providers:  IncredibleEmployment.be  This test is no t yet approved or cleared by the [place] FDA and  has been authorized for detection and/or diagnosis of SARS-CoV-2 by FDA under an Emergency Use Authorization (EUA). This EUA will remain  in effect (meaning this test can be used) for the duration of the COVID-19 declaration under Section 564(b)(1) of the Act, 21 U.S.C.section 360bbb-3(b)(1), unless the authorization is terminated  or revoked sooner.       Influenza A by PCR NEGATIVE NEGATIVE Final   Influenza B by PCR NEGATIVE NEGATIVE Final    Comment: (NOTE) The Xpert  Xpress SARS-CoV-2/FLU/RSV plus assay is intended as an aid in the diagnosis of influenza from Nasopharyngeal swab specimens and should not be used as a sole basis for treatment. Nasal washings and aspirates are unacceptable for Xpert Xpress SARS-CoV-2/FLU/RSV testing.  Fact Sheet for Patients: EntrepreneurPulse.com.au  Fact Sheet for Healthcare Providers: IncredibleEmployment.be  This test is not yet approved or cleared by the [place] FDA and has been authorized for detection and/or diagnosis of SARS-CoV-2 by FDA under an Emergency Use Authorization (EUA). This EUA will remain in effect (meaning this test can be used) for the duration of the COVID-19 declaration under Section 564(b)(1) of the Act, 21 U.S.C. section 360bbb-3(b)(1), unless the authorization is terminated or revoked.  Performed at [facility], [address], [place], [place] [address]      Time coordinating discharge: Over 40 minutes  SIGNED:   Charlynne Cousins, MD  [facility] 08/01/2020, 2:09 PM Pager   If 7PM-7AM, please contact night-coverage [email or web address] Password [email or web address]

## 2020-08-01 DIAGNOSIS — E1110 Type 2 diabetes mellitus with ketoacidosis without coma: Secondary | ICD-10-CM | POA: Diagnosis not present

## 2020-08-01 LAB — GLUCOSE, CAPILLARY
Glucose-Capillary: 110 mg/dL — ABNORMAL HIGH (ref 70–99)
Glucose-Capillary: 127 mg/dL — ABNORMAL HIGH (ref 70–99)
Glucose-Capillary: 139 mg/dL — ABNORMAL HIGH (ref 70–99)
Glucose-Capillary: 123 mg/dL — ABNORMAL HIGH (ref 70–99)
Glucose-Capillary: 134 mg/dL — ABNORMAL HIGH (ref 70–99)

## 2020-08-01 NOTE — TOC Transition Note (Signed)
Transition of Care [facility]) - CM/SW Discharge Note   Patient Details  Name: [name] MRN: [id number] Date of Birth: 1945/12/06  Transition of Care [facility]) CM/SW Contact:  Shelbie Hutching, RN Phone Number: 08/01/2020, 3:23 PM   Clinical Narrative:    RNCM called back to [facility] to check on authorization and they are requiring a new authorization to be submitted.  New auth started.  Butch Penny, MD and bedside RN that DC will be tomorrow.  COVID test is still within required time, no need to get a new one.       Barriers to Discharge: Continued Medical Work up   Patient Goals and CMS Choice Patient states their goals for this hospitalization and ongoing recovery are:: Patient would like to go to rehab and be involved in all decisions CMS Medicare.gov Compare Post Acute Care list provided to:: Patient Choice offered to / list presented to : Patient  Discharge Placement                       Discharge Plan and Services   Discharge Planning Services: CM Consult Post Acute Care Choice: [place]          DME Arranged: N/A DME Agency: NA       HH Arranged: NA HH Agency: NA        Social Determinants of Health (SDOH) Interventions     Readmission Risk Interventions No flowsheet data found.

## 2020-08-01 NOTE — Plan of Care (Signed)

## 2020-08-01 NOTE — Progress Notes (Signed)
PROGRESS NOTE    Fadia Marlar  HWE:[id number] DOB: Nov 28, 1945 DOA: 07/24/2020 PCP: Mechele Claude, FNP   Patient is medically stable for transfer has been awaiting placement for several days. Brief Narrative:  Simmone Cape is a 75 year old female who was brought to the ER by EMS for evaluation of weakness and found to be in DKA.  Patient was started on an insulin drip and will be admitted to the hospital for further evaluation.   Subjective: No new complaints tolerating her diet.  Objective: Vitals:   07/31/20 2351 08/01/20 0335 08/01/20 0754 08/01/20 1152  BP: 111/75 131/82 129/75 117/76  Pulse: 80 74 70 74  Resp: 16 20 16 16   Temp: 97.9 F (36.6 C) 98 F (36.7 C) 98 F (36.7 C) 97.8 F (36.6 C)  TempSrc: Oral Oral    SpO2: 94% 93% 93% 94%  Weight:      Height:        Intake/Output Summary (Last 24 hours) at 08/01/2020 1225 Last data filed at 08/01/2020 8938 Gross per 24 hour  Intake 520 ml  Output 750 ml  Net -230 ml   Filed Weights   07/24/20 1400  Weight: 78.6 kg    Examination: General exam: In no acute distress. Respiratory system: Good air movement and clear to auscultation. Cardiovascular system: S1 & S2 heard, RRR. No JVD. Gastrointestinal system: Abdomen is nondistended, soft and nontender.  Extremities: No pedal edema. Skin: No rashes, lesions or ulcers Psychiatry: Judgement and insight appear normal. Mood & affect appropriate.   Data Reviewed: I have personally reviewed following labs and imaging studies  CBC: No results for input(s): WBC, NEUTROABS, HGB, HCT, MCV, PLT in the last 168 hours. Basic Metabolic Panel: Recent Labs  Lab 07/26/20 0637 07/27/20 0547 07/28/20 0457 07/29/20 0455 07/31/20 0457  NA 137 139 137 136  --   K 3.3* 4.3 4.7 4.0  --   CL 105 105 103 101  --   CO2 24 26 26 25   --   GLUCOSE 197* 166* 181* 134*  --   BUN 17 12 17 19   --   CREATININE 0.96 0.89 1.10* 1.01* 1.52*  CALCIUM 8.6* 8.7* 9.0 9.4   --    GFR: Estimated Creatinine Clearance: 31 mL/min (A) (by C-G formula based on SCr of 1.52 mg/dL (H)). Liver Function Tests: No results for input(s): AST, ALT, ALKPHOS, BILITOT, PROT, ALBUMIN in the last 168 hours. No results for input(s): LIPASE, AMYLASE in the last 168 hours. No results for input(s): AMMONIA in the last 168 hours. Coagulation Profile: No results for input(s): INR, PROTIME in the last 168 hours. Cardiac Enzymes: No results for input(s): CKTOTAL, CKMB, CKMBINDEX, TROPONINI in the last 168 hours. BNP (last 3 results) No results for input(s): PROBNP in the last 8760 hours. HbA1C: No results for input(s): HGBA1C in the last 72 hours. CBG: Recent Labs  Lab 07/31/20 1606 07/31/20 2101 08/01/20 0650 08/01/20 0754 08/01/20 1159  GLUCAP 96 166* 110* 127* 139*   Lipid Profile: No results for input(s): CHOL, HDL, LDLCALC, TRIG, CHOLHDL, LDLDIRECT in the last 72 hours. Thyroid Function Tests: No results for input(s): TSH, T4TOTAL, FREET4, T3FREE, THYROIDAB in the last 72 hours. Anemia Panel: No results for input(s): VITAMINB12, FOLATE, FERRITIN, TIBC, IRON, RETICCTPCT in the last 72 hours. Sepsis Labs: No results for input(s): PROCALCITON, LATICACIDVEN in the last 168 hours.  Recent Results (from the past 240 hour(s))  SARS CORONAVIRUS 2 (TAT 6-24 HRS) Nasopharyngeal Nasopharyngeal Swab  Status: None   Collection Time: 07/24/20 12:24 PM   Specimen: Nasopharyngeal Swab  Result Value Ref Range Status   SARS Coronavirus 2 NEGATIVE NEGATIVE Final    Comment: (NOTE) SARS-CoV-2 target nucleic acids are NOT DETECTED.  The SARS-CoV-2 RNA is generally detectable in upper and lower respiratory specimens during the acute phase of infection. Negative results do not preclude SARS-CoV-2 infection, do not rule out co-infections with other pathogens, and should not be used as the sole basis for treatment or other patient management decisions. Negative results must be  combined with clinical observations, patient history, and epidemiological information. The expected result is Negative.  Fact Sheet for Patients: SugarRoll.be  Fact Sheet for Healthcare Providers: https://www.woods-mathews.com/  This test is not yet approved or cleared by the [place] FDA and  has been authorized for detection and/or diagnosis of SARS-CoV-2 by FDA under an Emergency Use Authorization (EUA). This EUA will remain  in effect (meaning this test can be used) for the duration of the COVID-19 declaration under Se ction 564(b)(1) of the Act, 21 U.S.C. section 360bbb-3(b)(1), unless the authorization is terminated or revoked sooner.  Performed at [facility], [place] [address], [place], [place] [address]   MRSA PCR Screening     Status: None   Collection Time: 07/24/20  2:30 PM   Specimen: Nasal Mucosa; Nasopharyngeal  Result Value Ref Range Status   MRSA by PCR NEGATIVE NEGATIVE Final    Comment:        The GeneXpert MRSA Assay (FDA approved for NASAL specimens only), is one component of a comprehensive MRSA colonization surveillance program. It is not intended to diagnose MRSA infection nor to guide or monitor treatment for MRSA infections. Performed at [facility], [address] [place], [place] [address]   Resp Panel by RT-PCR (Flu A&B, Covid) Nasopharyngeal Swab     Status: None   Collection Time: 07/28/20  5:20 PM   Specimen: Nasopharyngeal Swab; Nasopharyngeal(NP) swabs in vial transport medium  Result Value Ref Range Status   SARS Coronavirus 2 by RT PCR NEGATIVE NEGATIVE Final    Comment: (NOTE) SARS-CoV-2 target nucleic acids are NOT DETECTED.  The SARS-CoV-2 RNA is generally detectable in upper respiratory specimens during the acute phase of infection. The lowest concentration of SARS-CoV-2 viral copies this assay can detect is 138 copies/mL. A negative result does not preclude  SARS-Cov-2 infection and should not be used as the sole basis for treatment or other patient management decisions. A negative result may occur with  improper specimen collection/handling, submission of specimen other than nasopharyngeal swab, presence of viral mutation(s) within the areas targeted by this assay, and inadequate number of viral copies(<138 copies/mL). A negative result must be combined with clinical observations, patient history, and epidemiological information. The expected result is Negative.  Fact Sheet for Patients:  EntrepreneurPulse.com.au  Fact Sheet for Healthcare Providers:  IncredibleEmployment.be  This test is no t yet approved or cleared by the [place] FDA and  has been authorized for detection and/or diagnosis of SARS-CoV-2 by FDA under an Emergency Use Authorization (EUA). This EUA will remain  in effect (meaning this test can be used) for the duration of the COVID-19 declaration under Section 564(b)(1) of the Act, 21 U.S.C.section 360bbb-3(b)(1), unless the authorization is terminated  or revoked sooner.       Influenza A by PCR NEGATIVE NEGATIVE Final   Influenza B by PCR NEGATIVE NEGATIVE Final    Comment: (NOTE) The Xpert Xpress SARS-CoV-2/FLU/RSV  plus assay is intended as an aid in the diagnosis of influenza from Nasopharyngeal swab specimens and should not be used as a sole basis for treatment. Nasal washings and aspirates are unacceptable for Xpert Xpress SARS-CoV-2/FLU/RSV testing.  Fact Sheet for Patients: EntrepreneurPulse.com.au  Fact Sheet for Healthcare Providers: IncredibleEmployment.be  This test is not yet approved or cleared by the [place] FDA and has been authorized for detection and/or diagnosis of SARS-CoV-2 by FDA under an Emergency Use Authorization (EUA). This EUA will remain in effect (meaning this test can be used) for the duration of  the COVID-19 declaration under Section 564(b)(1) of the Act, 21 U.S.C. section 360bbb-3(b)(1), unless the authorization is terminated or revoked.  Performed at [facility], [place], [place] [address]   Resp Panel by RT-PCR (Flu A&B, Covid) Nasopharyngeal Swab     Status: None   Collection Time: 07/30/20  3:05 PM   Specimen: Nasopharyngeal Swab; Nasopharyngeal(NP) swabs in vial transport medium  Result Value Ref Range Status   SARS Coronavirus 2 by RT PCR NEGATIVE NEGATIVE Final    Comment: (NOTE) SARS-CoV-2 target nucleic acids are NOT DETECTED.  The SARS-CoV-2 RNA is generally detectable in upper respiratory specimens during the acute phase of infection. The lowest concentration of SARS-CoV-2 viral copies this assay can detect is 138 copies/mL. A negative result does not preclude SARS-Cov-2 infection and should not be used as the sole basis for treatment or other patient management decisions. A negative result may occur with  improper specimen collection/handling, submission of specimen other than nasopharyngeal swab, presence of viral mutation(s) within the areas targeted by this assay, and inadequate number of viral copies(<138 copies/mL). A negative result must be combined with clinical observations, patient history, and epidemiological information. The expected result is Negative.  Fact Sheet for Patients:  EntrepreneurPulse.com.au  Fact Sheet for Healthcare Providers:  IncredibleEmployment.be  This test is no t yet approved or cleared by the [place] FDA and  has been authorized for detection and/or diagnosis of SARS-CoV-2 by FDA under an Emergency Use Authorization (EUA). This EUA will remain  in effect (meaning this test can be used) for the duration of the COVID-19 declaration under Section 564(b)(1) of the Act, 21 U.S.C.section 360bbb-3(b)(1), unless the authorization is terminated  or revoked  sooner.       Influenza A by PCR NEGATIVE NEGATIVE Final   Influenza B by PCR NEGATIVE NEGATIVE Final    Comment: (NOTE) The Xpert Xpress SARS-CoV-2/FLU/RSV plus assay is intended as an aid in the diagnosis of influenza from Nasopharyngeal swab specimens and should not be used as a sole basis for treatment. Nasal washings and aspirates are unacceptable for Xpert Xpress SARS-CoV-2/FLU/RSV testing.  Fact Sheet for Patients: EntrepreneurPulse.com.au  Fact Sheet for Healthcare Providers: IncredibleEmployment.be  This test is not yet approved or cleared by the [place] FDA and has been authorized for detection and/or diagnosis of SARS-CoV-2 by FDA under an Emergency Use Authorization (EUA). This EUA will remain in effect (meaning this test can be used) for the duration of the COVID-19 declaration under Section 564(b)(1) of the Act, 21 U.S.C. section 360bbb-3(b)(1), unless the authorization is terminated or revoked.  Performed at [facility], [address] [place], [place] [address]          Radiology Studies: No results found.      Scheduled Meds: . Chlorhexidine Gluconate Cloth  6 each Topical Daily  . clonazePAM  1 mg Oral BID  . divalproex  1,000 mg Oral QHS  . enalapril  5 mg Oral BID  . enoxaparin (LOVENOX) injection  40 mg Subcutaneous Q24H  . FLUoxetine  60 mg Oral Daily  . insulin aspart  0-15 Units Subcutaneous TID WC  . insulin aspart  0-5 Units Subcutaneous QHS  . insulin aspart  4 Units Subcutaneous TID WC  . insulin detemir  7 Units Subcutaneous BID  . levothyroxine  88 mcg Oral QAC breakfast  . linagliptin  5 mg Oral BID  . loratadine  10 mg Oral Daily  . lurasidone  20 mg Oral Daily  . mouth rinse  15 mL Mouth Rinse BID  . metFORMIN  500 mg Oral BID WC  . rosuvastatin  40 mg Oral QHS   Continuous Infusions:   Assessment & Plan:   Principal Problem:   DKA (diabetic ketoacidosis)  ([place]) Active Problems:   Essential hypertension   Hypothyroidism   Severe bipolar I disorder, current or most recent episode depressed ([place])  DKA Likely due to noncompliance with his medication. KVO IV fluids. Started on Tradjenta and Metformin should continue these as an outpatient. Patient is still awaiting skilled nursing facility placement.   Social worker is working on it. Blood glucose high she was restarted back on her long-acting insulin continue sliding scale and CBGs before meals and at bedtime Patient is medically stable for to be transferred.  Essential hypertension Continue to hold ACE inhibitor can be resumed as an outpatient. Blood pressure is improved.  Hypothyroidism Continue Synthroid  Severe bipolar disorder Continue Latuda, fluoxetine, Depakote   DVT prophylaxis: Lovenox Code Status: Full Family Communication: None at bedside  Status is: Inpatient  Remains inpatient appropriate because:Inpatient level of care appropriate due to severity of illness   Dispo: The patient is from: Home              Anticipated d/c is to: SNF              Patient currently is not medically stable to d/c.   Difficult to place patient No    anticipated discharge date 1 to 2 days.  Still on IV fluids due to low blood pressure.           LOS: 8 days   Time spent: 35 minutes    Charlynne Cousins, MD [facility] Pager [phone]  If 7PM-7AM, please contact night-coverage 08/01/2020, 12:25 PM

## 2020-08-01 NOTE — TOC Progression Note (Signed)
Transition of Care [facility]) - Progression Note    Patient Details  Name: [name] MRN: [id number] Date of Birth: 1945/12/13  Transition of Care [facility]) CM/SW Contact  Shelbie Hutching, RN Phone Number: 08/01/2020, 1:51 PM  Clinical Narrative:    Passr has come back [phone] F good starting today 3/15 to 08/31/20.   [facility] can accept patient today.  Updated clinicals uploaded to [place].  Patient will go to room 1002 at [facility]  Bedside RN will call report to [phone].     Expected Discharge Plan: [place] Barriers to Discharge: Continued Medical Work up  Expected Discharge Plan and Services Expected Discharge Plan: [place]   Discharge Planning Services: CM Consult Post Acute Care Choice: [place] Living arrangements for the past 2 months: Apartment Expected Discharge Date: 07/31/20               DME Arranged: N/A DME Agency: NA       HH Arranged: NA HH Agency: NA         Social Determinants of Health (SDOH) Interventions    Readmission Risk Interventions No flowsheet data found.

## 2020-08-02 DIAGNOSIS — E1110 Type 2 diabetes mellitus with ketoacidosis without coma: Secondary | ICD-10-CM | POA: Diagnosis not present

## 2020-08-02 LAB — GLUCOSE, CAPILLARY: Glucose-Capillary: 167 mg/dL — ABNORMAL HIGH (ref 70–99)

## 2020-08-02 NOTE — Progress Notes (Signed)
Report given to Lafe Garin RN at facility

## 2020-08-02 NOTE — TOC Transition Note (Signed)
Transition of Care [facility]) - CM/SW Discharge Note   Patient Details  Name: [name] MRN: [id number] Date of Birth: [birth year]  Transition of Care [facility]) CM/SW Contact:  Shelbie Hutching, RN Phone Number: 08/02/2020, 9:09 AM   Clinical Narrative:    Insurance authorization approved.  Patient will go to [facility] 1002.  Bedside RN to call report and RNCM will arrange transport.     Final next level of care: Skilled Nursing Facility Barriers to Discharge: No Barriers Identified   Patient Goals and CMS Choice Patient states their goals for this hospitalization and ongoing recovery are:: Patient would like to go to rehab and be involved in all decisions CMS Medicare.gov Compare Post Acute Care list provided to:: Patient Choice offered to / list presented to : Patient  Discharge Placement PASRR number recieved: 08/01/20            Patient chooses bed at: [facility] Patient to be transferred to facility by: [facility] Name of family member notified: NA Patient and family notified of of transfer: 08/02/20  Discharge Plan and Services   Discharge Planning Services: CM Consult Post Acute Care Choice: [place]          DME Arranged: N/A DME Agency: NA       HH Arranged: NA HH Agency: NA        Social Determinants of Health (SDOH) Interventions     Readmission Risk Interventions No flowsheet data found.

## 2020-08-02 NOTE — Discharge Summary (Signed)
Physician Discharge Summary   Jackie Russman NGE:[id number] DOB: [birth year] DOA: 07/24/2020  PCP: Mechele Claude, FNP  Admit date: 07/24/2020 Discharge date: 08/02/2020   Admitted From: home Disposition:  SNF Discharging physician: Dwyane Dee, MD  Recommendations for Outpatient Follow-up:  1. Follow up with PCP 2. Repeat BMP/CBC in ~1 week   Patient discharged to SNF in Discharge Condition: stable Risk of unplanned readmission score: Unplanned Admission- Pilot do not use: 20.93  CODE STATUS: Full Diet recommendation:  Diet Orders (From admission, onward)    Start     Ordered   07/31/20 0000  Diet - low sodium heart healthy        07/31/20 0950   07/27/20 0000  Diet - low sodium heart healthy        07/27/20 4010   07/24/20 2146  Diet heart healthy/carb modified Room service appropriate? Yes; Fluid consistency: Thin  Diet effective now       Question Answer Comment  Diet-HS Snack? Nothing   Room service appropriate? Yes   Fluid consistency: Thin      07/24/20 2146          Hospital Course:  Ms. Gruenberg is a 75 yo female with PMH hypertension, diabetes, hypothyroidism, breast cancer, bipolar disorder who was admitted with weakness and falling at home.  She was found to be in DKA on work-up and underwent treatment with insulin drip and fluids.  She responded well and was able to be transitioned off the insulin drip to subcutaneous insulin.  She was evaluated by PT with recommendations for SNF upon discharge.  DMII - continue metformin, detemir, glipizide, tradjenta  Essential hypertension Continue to hold ACE inhibitor can be resumed as an outpatient. Blood pressure is improved.  Hypothyroidism Continue Synthroid  Severe bipolar disorder Continue Latuda, fluoxetine, Depakote    Principal Diagnosis: DKA (diabetic ketoacidosis) ([place])  Discharge Diagnoses: Active Hospital Problems   Diagnosis Date Noted  . DKA (diabetic ketoacidosis) ([place])  07/24/2020  . Severe bipolar I disorder, current or most recent episode depressed ([place]) 07/07/2017  . Essential hypertension 09/21/2014  . Hypothyroidism 09/21/2014    Resolved Hospital Problems  No resolved problems to display.    Discharge Instructions    Diet - low sodium heart healthy   Complete by: As directed    Diet - low sodium heart healthy   Complete by: As directed    Increase activity slowly   Complete by: As directed    Increase activity slowly   Complete by: As directed      Allergies as of 08/02/2020      Reactions   Navane [thiothixene] Other (See Comments)   Reaction:  Unknown   Penicillins Rash   Has patient had a PCN reaction causing immediate rash, facial/tongue/throat swelling, SOB or lightheadedness with hypotension: No Has patient had a PCN reaction causing severe rash involving mucus membranes or skin necrosis: No Has patient had a PCN reaction that required hospitalization: No Has patient had a PCN reaction occurring within the last 10 years: No If all of the above answers are "NO", then may proceed with Cephalosporin use.      Medication List    STOP taking these medications   simvastatin 20 MG tablet Commonly known as: ZOCOR     TAKE these medications   clonazePAM 1 MG tablet Commonly known as: KLONOPIN Take 1 tablet (1 mg total) by mouth 2 (two) times daily.   divalproex 500 MG 24 hr tablet Commonly known  as: DEPAKOTE ER Take 2 tablets (1,000 mg total) by mouth at bedtime.   enalapril 5 MG tablet Commonly known as: VASOTEC Take 1 tablet (5 mg total) by mouth 2 (two) times daily.   FLUoxetine 20 MG capsule Commonly known as: PROZAC Take 60 mg by mouth daily.   glipiZIDE 10 MG 24 hr tablet Commonly known as: GLUCOTROL XL Take 1 tablet (10 mg total) by mouth daily with breakfast.   insulin detemir 100 UNIT/ML injection Commonly known as: LEVEMIR Inject 0.15 mLs (15 Units total) into the skin daily.   Latuda 40 MG Tabs  tablet Generic drug: lurasidone Take 20 mg by mouth daily.   levothyroxine 88 MCG tablet Commonly known as: SYNTHROID Take 88 mcg by mouth daily before breakfast.   linagliptin 5 MG Tabs tablet Commonly known as: TRADJENTA Take 5 mg by mouth 2 (two) times daily.   loratadine 10 MG tablet Commonly known as: CLARITIN Take 1 tablet (10 mg total) by mouth daily.   metFORMIN 500 MG tablet Commonly known as: GLUCOPHAGE Take 2 tablets (1,000 mg total) by mouth 2 (two) times daily with a meal.   rosuvastatin 40 MG tablet Commonly known as: CRESTOR Take 1 tablet (40 mg total) by mouth daily.       Contact information for after-discharge care    Destination    [facility] .   Service: Skilled Nursing Contact information: [address] [place] [phone]                 Allergies  Allergen Reactions  . Navane [Thiothixene] Other (See Comments)    Reaction:  Unknown  . Penicillins Rash    Has patient had a PCN reaction causing immediate rash, facial/tongue/throat swelling, SOB or lightheadedness with hypotension: No Has patient had a PCN reaction causing severe rash involving mucus membranes or skin necrosis: No Has patient had a PCN reaction that required hospitalization: No Has patient had a PCN reaction occurring within the last 10 years: No If all of the above answers are "NO", then may proceed with Cephalosporin use.     Consultations: n/a  Discharge Exam: BP 123/83 (BP Location: Left Arm)   Pulse 68   Temp 97.8 F (36.6 C) (Oral)   Resp 17   Ht 5\' 2"  (1.575 m)   Wt 78.6 kg   SpO2 95%   BMI 31.69 kg/m  General appearance: alert, cooperative and no distress Head: Normocephalic, without obvious abnormality, atraumatic Eyes: EOMI Lungs: clear to auscultation bilaterally Heart: regular rate and rhythm and S1, S2 normal Abdomen: normal findings: bowel sounds normal and soft, non-tender Extremities:  no edema Skin: mobility and turgor normal Neurologic: Grossly normal  The results of significant diagnostics from this hospitalization (including imaging, microbiology, ancillary and laboratory) are listed below for reference.   Microbiology: Recent Results (from the past 240 hour(s))  SARS CORONAVIRUS 2 (TAT 6-24 HRS) Nasopharyngeal Nasopharyngeal Swab     Status: None   Collection Time: 07/24/20 12:24 PM   Specimen: Nasopharyngeal Swab  Result Value Ref Range Status   SARS Coronavirus 2 NEGATIVE NEGATIVE Final    Comment: (NOTE) SARS-CoV-2 target nucleic acids are NOT DETECTED.  The SARS-CoV-2 RNA is generally detectable in upper and lower respiratory specimens during the acute phase of infection. Negative results do not preclude SARS-CoV-2 infection, do not rule out co-infections with other pathogens, and should not be used as the sole basis for treatment or other patient management decisions.  Negative results must be combined with clinical observations, patient history, and epidemiological information. The expected result is Negative.  Fact Sheet for Patients: SugarRoll.be  Fact Sheet for Healthcare Providers: https://www.woods-mathews.com/  This test is not yet approved or cleared by the [place] FDA and  has been authorized for detection and/or diagnosis of SARS-CoV-2 by FDA under an Emergency Use Authorization (EUA). This EUA will remain  in effect (meaning this test can be used) for the duration of the COVID-19 declaration under Se ction 564(b)(1) of the Act, 21 U.S.C. section 360bbb-3(b)(1), unless the authorization is terminated or revoked sooner.  Performed at [facility], [place] [address], [place], [place] [address]   MRSA PCR Screening     Status: None   Collection Time: 07/24/20  2:30 PM   Specimen: Nasal Mucosa; Nasopharyngeal  Result Value Ref Range Status   MRSA by PCR NEGATIVE NEGATIVE Final    Comment:         The GeneXpert MRSA Assay (FDA approved for NASAL specimens only), is one component of a comprehensive MRSA colonization surveillance program. It is not intended to diagnose MRSA infection nor to guide or monitor treatment for MRSA infections. Performed at [facility], [address] [place], [place] [address]   Resp Panel by RT-PCR (Flu A&B, Covid) Nasopharyngeal Swab     Status: None   Collection Time: 07/28/20  5:20 PM   Specimen: Nasopharyngeal Swab; Nasopharyngeal(NP) swabs in vial transport medium  Result Value Ref Range Status   SARS Coronavirus 2 by RT PCR NEGATIVE NEGATIVE Final    Comment: (NOTE) SARS-CoV-2 target nucleic acids are NOT DETECTED.  The SARS-CoV-2 RNA is generally detectable in upper respiratory specimens during the acute phase of infection. The lowest concentration of SARS-CoV-2 viral copies this assay can detect is 138 copies/mL. A negative result does not preclude SARS-Cov-2 infection and should not be used as the sole basis for treatment or other patient management decisions. A negative result may occur with  improper specimen collection/handling, submission of specimen other than nasopharyngeal swab, presence of viral mutation(s) within the areas targeted by this assay, and inadequate number of viral copies(<138 copies/mL). A negative result must be combined with clinical observations, patient history, and epidemiological information. The expected result is Negative.  Fact Sheet for Patients:  EntrepreneurPulse.com.au  Fact Sheet for Healthcare Providers:  IncredibleEmployment.be  This test is no t yet approved or cleared by the [place] FDA and  has been authorized for detection and/or diagnosis of SARS-CoV-2 by FDA under an Emergency Use Authorization (EUA). This EUA will remain  in effect (meaning this test can be used) for the duration of the COVID-19 declaration under Section  564(b)(1) of the Act, 21 U.S.C.section 360bbb-3(b)(1), unless the authorization is terminated  or revoked sooner.       Influenza A by PCR NEGATIVE NEGATIVE Final   Influenza B by PCR NEGATIVE NEGATIVE Final    Comment: (NOTE) The Xpert Xpress SARS-CoV-2/FLU/RSV plus assay is intended as an aid in the diagnosis of influenza from Nasopharyngeal swab specimens and should not be used as a sole basis for treatment. Nasal washings and aspirates are unacceptable for Xpert Xpress SARS-CoV-2/FLU/RSV testing.  Fact Sheet for Patients: EntrepreneurPulse.com.au  Fact Sheet for Healthcare Providers: IncredibleEmployment.be  This test is not yet approved or cleared by the [place] FDA and has been authorized for detection and/or diagnosis of SARS-CoV-2 by FDA under an Emergency Use Authorization (EUA). This EUA will remain in effect (meaning  this test can be used) for the duration of the COVID-19 declaration under Section 564(b)(1) of the Act, 21 U.S.C. section 360bbb-3(b)(1), unless the authorization is terminated or revoked.  Performed at [facility], [place], [place] [address]   Resp Panel by RT-PCR (Flu A&B, Covid) Nasopharyngeal Swab     Status: None   Collection Time: 07/30/20  3:05 PM   Specimen: Nasopharyngeal Swab; Nasopharyngeal(NP) swabs in vial transport medium  Result Value Ref Range Status   SARS Coronavirus 2 by RT PCR NEGATIVE NEGATIVE Final    Comment: (NOTE) SARS-CoV-2 target nucleic acids are NOT DETECTED.  The SARS-CoV-2 RNA is generally detectable in upper respiratory specimens during the acute phase of infection. The lowest concentration of SARS-CoV-2 viral copies this assay can detect is 138 copies/mL. A negative result does not preclude SARS-Cov-2 infection and should not be used as the sole basis for treatment or other patient management decisions. A negative result may occur with  improper  specimen collection/handling, submission of specimen other than nasopharyngeal swab, presence of viral mutation(s) within the areas targeted by this assay, and inadequate number of viral copies(<138 copies/mL). A negative result must be combined with clinical observations, patient history, and epidemiological information. The expected result is Negative.  Fact Sheet for Patients:  EntrepreneurPulse.com.au  Fact Sheet for Healthcare Providers:  IncredibleEmployment.be  This test is no t yet approved or cleared by the [place] FDA and  has been authorized for detection and/or diagnosis of SARS-CoV-2 by FDA under an Emergency Use Authorization (EUA). This EUA will remain  in effect (meaning this test can be used) for the duration of the COVID-19 declaration under Section 564(b)(1) of the Act, 21 U.S.C.section 360bbb-3(b)(1), unless the authorization is terminated  or revoked sooner.       Influenza A by PCR NEGATIVE NEGATIVE Final   Influenza B by PCR NEGATIVE NEGATIVE Final    Comment: (NOTE) The Xpert Xpress SARS-CoV-2/FLU/RSV plus assay is intended as an aid in the diagnosis of influenza from Nasopharyngeal swab specimens and should not be used as a sole basis for treatment. Nasal washings and aspirates are unacceptable for Xpert Xpress SARS-CoV-2/FLU/RSV testing.  Fact Sheet for Patients: EntrepreneurPulse.com.au  Fact Sheet for Healthcare Providers: IncredibleEmployment.be  This test is not yet approved or cleared by the [place] FDA and has been authorized for detection and/or diagnosis of SARS-CoV-2 by FDA under an Emergency Use Authorization (EUA). This EUA will remain in effect (meaning this test can be used) for the duration of the COVID-19 declaration under Section 564(b)(1) of the Act, 21 U.S.C. section 360bbb-3(b)(1), unless the authorization is terminated or revoked.  Performed at  [facility], [address] [place], [place] [address]      Labs: BNP (last 3 results) Recent Labs    03/01/20 1312  BNP 81.8   Basic Metabolic Panel: Recent Labs  Lab 07/27/20 0547 07/28/20 0457 07/29/20 0455 07/31/20 0457  NA 139 137 136  --   K 4.3 4.7 4.0  --   CL 105 103 101  --   CO2 26 26 25   --   GLUCOSE 166* 181* 134*  --   BUN 12 17 19   --   CREATININE 0.89 1.10* 1.01* 1.52*  CALCIUM 8.7* 9.0 9.4  --    Liver Function Tests: No results for input(s): AST, ALT, ALKPHOS, BILITOT, PROT, ALBUMIN in the last 168 hours. No results for input(s): LIPASE, AMYLASE in the last 168 hours. No results  for input(s): AMMONIA in the last 168 hours. CBC: No results for input(s): WBC, NEUTROABS, HGB, HCT, MCV, PLT in the last 168 hours. Cardiac Enzymes: No results for input(s): CKTOTAL, CKMB, CKMBINDEX, TROPONINI in the last 168 hours. BNP: Invalid input(s): POCBNP CBG: Recent Labs  Lab 08/01/20 0754 08/01/20 1159 08/01/20 1550 08/01/20 2117 08/02/20 0805  GLUCAP 127* 139* 123* 134* 167*   D-Dimer No results for input(s): DDIMER in the last 72 hours. Hgb A1c No results for input(s): HGBA1C in the last 72 hours. Lipid Profile No results for input(s): CHOL, HDL, LDLCALC, TRIG, CHOLHDL, LDLDIRECT in the last 72 hours. Thyroid function studies No results for input(s): TSH, T4TOTAL, T3FREE, THYROIDAB in the last 72 hours.  Invalid input(s): FREET3 Anemia work up No results for input(s): VITAMINB12, FOLATE, FERRITIN, TIBC, IRON, RETICCTPCT in the last 72 hours. Urinalysis    Component Value Date/Time   COLORURINE YELLOW (A) 07/24/2020 1041   APPEARANCEUR CLEAR (A) 07/24/2020 1041   APPEARANCEUR Clear 02/24/2017 1050   LABSPEC 1.027 07/24/2020 1041   LABSPEC 1.018 02/25/2014 0927   PHURINE 5.0 07/24/2020 1041   GLUCOSEU >=500 (A) 07/24/2020 1041   GLUCOSEU Negative 02/25/2014 0927   HGBUR SMALL (A) 07/24/2020 1041   BILIRUBINUR NEGATIVE 07/24/2020  1041   BILIRUBINUR Negative 02/24/2017 1050   BILIRUBINUR Negative 02/25/2014 0927   KETONESUR 80 (A) 07/24/2020 1041   PROTEINUR NEGATIVE 07/24/2020 1041   NITRITE NEGATIVE 07/24/2020 1041   LEUKOCYTESUR NEGATIVE 07/24/2020 1041   LEUKOCYTESUR 3+ 02/25/2014 0927   Sepsis Labs Invalid input(s): PROCALCITONIN,  WBC,  LACTICIDVEN Microbiology Recent Results (from the past 240 hour(s))  SARS CORONAVIRUS 2 (TAT 6-24 HRS) Nasopharyngeal Nasopharyngeal Swab     Status: None   Collection Time: 07/24/20 12:24 PM   Specimen: Nasopharyngeal Swab  Result Value Ref Range Status   SARS Coronavirus 2 NEGATIVE NEGATIVE Final    Comment: (NOTE) SARS-CoV-2 target nucleic acids are NOT DETECTED.  The SARS-CoV-2 RNA is generally detectable in upper and lower respiratory specimens during the acute phase of infection. Negative results do not preclude SARS-CoV-2 infection, do not rule out co-infections with other pathogens, and should not be used as the sole basis for treatment or other patient management decisions. Negative results must be combined with clinical observations, patient history, and epidemiological information. The expected result is Negative.  Fact Sheet for Patients: SugarRoll.be  Fact Sheet for Healthcare Providers: https://www.woods-mathews.com/  This test is not yet approved or cleared by the [place] FDA and  has been authorized for detection and/or diagnosis of SARS-CoV-2 by FDA under an Emergency Use Authorization (EUA). This EUA will remain  in effect (meaning this test can be used) for the duration of the COVID-19 declaration under Se ction 564(b)(1) of the Act, 21 U.S.C. section 360bbb-3(b)(1), unless the authorization is terminated or revoked sooner.  Performed at [facility], [place] [address] [place], [place] [address]   MRSA PCR Screening     Status: None   Collection Time: 07/24/20  2:30 PM   Specimen: Nasal  Mucosa; Nasopharyngeal  Result Value Ref Range Status   MRSA by PCR NEGATIVE NEGATIVE Final    Comment:        The GeneXpert MRSA Assay (FDA approved for NASAL specimens only), is one component of a comprehensive MRSA colonization surveillance program. It is not intended to diagnose MRSA infection nor to guide or monitor treatment for MRSA infections. Performed at [facility], [address] [place], [place] [address]  Resp Panel by RT-PCR (Flu A&B, Covid) Nasopharyngeal Swab     Status: None   Collection Time: 07/28/20  5:20 PM   Specimen: Nasopharyngeal Swab; Nasopharyngeal(NP) swabs in vial transport medium  Result Value Ref Range Status   SARS Coronavirus 2 by RT PCR NEGATIVE NEGATIVE Final    Comment: (NOTE) SARS-CoV-2 target nucleic acids are NOT DETECTED.  The SARS-CoV-2 RNA is generally detectable in upper respiratory specimens during the acute phase of infection. The lowest concentration of SARS-CoV-2 viral copies this assay can detect is 138 copies/mL. A negative result does not preclude SARS-Cov-2 infection and should not be used as the sole basis for treatment or other patient management decisions. A negative result may occur with  improper specimen collection/handling, submission of specimen other than nasopharyngeal swab, presence of viral mutation(s) within the areas targeted by this assay, and inadequate number of viral copies(<138 copies/mL). A negative result must be combined with clinical observations, patient history, and epidemiological information. The expected result is Negative.  Fact Sheet for Patients:  EntrepreneurPulse.com.au  Fact Sheet for Healthcare Providers:  IncredibleEmployment.be  This test is no t yet approved or cleared by the [place] FDA and  has been authorized for detection and/or diagnosis of SARS-CoV-2 by FDA under an Emergency Use Authorization (EUA). This EUA will remain  in  effect (meaning this test can be used) for the duration of the COVID-19 declaration under Section 564(b)(1) of the Act, 21 U.S.C.section 360bbb-3(b)(1), unless the authorization is terminated  or revoked sooner.       Influenza A by PCR NEGATIVE NEGATIVE Final   Influenza B by PCR NEGATIVE NEGATIVE Final    Comment: (NOTE) The Xpert Xpress SARS-CoV-2/FLU/RSV plus assay is intended as an aid in the diagnosis of influenza from Nasopharyngeal swab specimens and should not be used as a sole basis for treatment. Nasal washings and aspirates are unacceptable for Xpert Xpress SARS-CoV-2/FLU/RSV testing.  Fact Sheet for Patients: EntrepreneurPulse.com.au  Fact Sheet for Healthcare Providers: IncredibleEmployment.be  This test is not yet approved or cleared by the [place] FDA and has been authorized for detection and/or diagnosis of SARS-CoV-2 by FDA under an Emergency Use Authorization (EUA). This EUA will remain in effect (meaning this test can be used) for the duration of the COVID-19 declaration under Section 564(b)(1) of the Act, 21 U.S.C. section 360bbb-3(b)(1), unless the authorization is terminated or revoked.  Performed at [facility], [place], [place] [address]   Resp Panel by RT-PCR (Flu A&B, Covid) Nasopharyngeal Swab     Status: None   Collection Time: 07/30/20  3:05 PM   Specimen: Nasopharyngeal Swab; Nasopharyngeal(NP) swabs in vial transport medium  Result Value Ref Range Status   SARS Coronavirus 2 by RT PCR NEGATIVE NEGATIVE Final    Comment: (NOTE) SARS-CoV-2 target nucleic acids are NOT DETECTED.  The SARS-CoV-2 RNA is generally detectable in upper respiratory specimens during the acute phase of infection. The lowest concentration of SARS-CoV-2 viral copies this assay can detect is 138 copies/mL. A negative result does not preclude SARS-Cov-2 infection and should not be used as the sole basis  for treatment or other patient management decisions. A negative result may occur with  improper specimen collection/handling, submission of specimen other than nasopharyngeal swab, presence of viral mutation(s) within the areas targeted by this assay, and inadequate number of viral copies(<138 copies/mL). A negative result must be combined with clinical observations, patient history, and epidemiological information. The expected result is Negative.  Fact  Sheet for Patients:  EntrepreneurPulse.com.au  Fact Sheet for Healthcare Providers:  IncredibleEmployment.be  This test is no t yet approved or cleared by the [place] FDA and  has been authorized for detection and/or diagnosis of SARS-CoV-2 by FDA under an Emergency Use Authorization (EUA). This EUA will remain  in effect (meaning this test can be used) for the duration of the COVID-19 declaration under Section 564(b)(1) of the Act, 21 U.S.C.section 360bbb-3(b)(1), unless the authorization is terminated  or revoked sooner.       Influenza A by PCR NEGATIVE NEGATIVE Final   Influenza B by PCR NEGATIVE NEGATIVE Final    Comment: (NOTE) The Xpert Xpress SARS-CoV-2/FLU/RSV plus assay is intended as an aid in the diagnosis of influenza from Nasopharyngeal swab specimens and should not be used as a sole basis for treatment. Nasal washings and aspirates are unacceptable for Xpert Xpress SARS-CoV-2/FLU/RSV testing.  Fact Sheet for Patients: EntrepreneurPulse.com.au  Fact Sheet for Healthcare Providers: IncredibleEmployment.be  This test is not yet approved or cleared by the [place] FDA and has been authorized for detection and/or diagnosis of SARS-CoV-2 by FDA under an Emergency Use Authorization (EUA). This EUA will remain in effect (meaning this test can be used) for the duration of the COVID-19 declaration under Section 564(b)(1) of the Act, 21  U.S.C. section 360bbb-3(b)(1), unless the authorization is terminated or revoked.  Performed at [facility], [address], [place], [place] [address]     Procedures/Studies: CT Head Wo Contrast  Result Date: 07/24/2020 CLINICAL DATA:  Facial trauma.  Fall with occipital swelling. EXAM: CT HEAD WITHOUT CONTRAST TECHNIQUE: Contiguous axial images were obtained from the base of the skull through the vertex without intravenous contrast. COMPARISON:  04/06/2020 FINDINGS: Brain: No evidence of acute infarction, hemorrhage, hydrocephalus, extra-axial collection or mass lesion/mass effect. Central predominant cerebral volume loss. Chronic small vessel ischemia in the deep white matter. Vascular: No hyperdense vessel or unexpected calcification. Skull: Normal. Negative for fracture or focal lesion. Sinuses/Orbits: No acute finding. IMPRESSION: No evidence of intracranial injury. Electronically Signed   By: Monte Fantasia M.D.   On: 07/24/2020 07:59     Time coordinating discharge: Over 30 minutes    Dwyane Dee, MD  [facility] 08/02/2020, 11:39 AM

## 2020-08-02 NOTE — TOC Progression Note (Signed)
Transition of Care [facility]) - Progression Note    Patient Details  Name: [name] MRN: [id number] Date of Birth: 1945-07-10  Transition of Care [facility]) CM/SW Contact  Shelbie Hutching, RN Phone Number: 08/02/2020, 9:42 AM  Clinical Narrative:     Patient has support from the [employer] through [place].  RNCM reached out to see if someone would be able to get the patient her glasses and some clothes and take them to [facility] for the patient.   Expected Discharge Plan: [place] Barriers to Discharge: No Barriers Identified  Expected Discharge Plan and Services Expected Discharge Plan: [place]   Discharge Planning Services: CM Consult Post Acute Care Choice: [place] Living arrangements for the past 2 months: Apartment Expected Discharge Date: 07/31/20               DME Arranged: N/A DME Agency: NA       HH Arranged: NA HH Agency: NA         Social Determinants of Health (SDOH) Interventions    Readmission Risk Interventions No flowsheet data found.

## 2020-08-02 NOTE — TOC Progression Note (Signed)
Transition of Care [facility]) - Progression Note    Patient Details  Name: [name] MRN: [id number] Date of Birth: 1946/03/01  Transition of Care [facility]) CM/SW Contact  Shelbie Hutching, RN Phone Number: 08/02/2020, 11:09 AM  Clinical Narrative:     Del did return RNCM call and will get some things together for the patient and take them to [facility].    Expected Discharge Plan: [place] Barriers to Discharge: No Barriers Identified  Expected Discharge Plan and Services Expected Discharge Plan: [place]   Discharge Planning Services: CM Consult Post Acute Care Choice: [place] Living arrangements for the past 2 months: Apartment Expected Discharge Date: 07/31/20               DME Arranged: N/A DME Agency: NA       HH Arranged: NA HH Agency: NA         Social Determinants of Health (SDOH) Interventions    Readmission Risk Interventions No flowsheet data found.

## 2020-08-02 NOTE — Progress Notes (Signed)
Attempted to call report [phone], I was told the nurse would call me back that they were "likely doing meds and breakfast right now."

## 2020-08-18 DIAGNOSIS — Z9049 Acquired absence of other specified parts of digestive tract: Secondary | ICD-10-CM | POA: Diagnosis not present

## 2020-08-18 DIAGNOSIS — N390 Urinary tract infection, site not specified: Secondary | ICD-10-CM | POA: Diagnosis not present

## 2020-08-18 DIAGNOSIS — E039 Hypothyroidism, unspecified: Secondary | ICD-10-CM | POA: Diagnosis not present

## 2020-08-18 DIAGNOSIS — Y92009 Unspecified place in unspecified non-institutional (private) residence as the place of occurrence of the external cause: Secondary | ICD-10-CM | POA: Diagnosis not present

## 2020-08-18 DIAGNOSIS — N183 Chronic kidney disease, stage 3 unspecified: Secondary | ICD-10-CM | POA: Diagnosis not present

## 2020-08-18 DIAGNOSIS — Z79899 Other long term (current) drug therapy: Secondary | ICD-10-CM | POA: Diagnosis not present

## 2020-08-18 DIAGNOSIS — F1021 Alcohol dependence, in remission: Secondary | ICD-10-CM | POA: Diagnosis present

## 2020-08-18 DIAGNOSIS — I152 Hypertension secondary to endocrine disorders: Secondary | ICD-10-CM | POA: Diagnosis not present

## 2020-08-18 DIAGNOSIS — Z79890 Hormone replacement therapy (postmenopausal): Secondary | ICD-10-CM | POA: Diagnosis not present

## 2020-08-18 DIAGNOSIS — R300 Dysuria: Secondary | ICD-10-CM | POA: Diagnosis not present

## 2020-08-18 DIAGNOSIS — Z90710 Acquired absence of both cervix and uterus: Secondary | ICD-10-CM | POA: Diagnosis not present

## 2020-08-18 DIAGNOSIS — R0602 Shortness of breath: Secondary | ICD-10-CM | POA: Diagnosis not present

## 2020-08-18 DIAGNOSIS — R1312 Dysphagia, oropharyngeal phase: Secondary | ICD-10-CM | POA: Diagnosis not present

## 2020-08-18 DIAGNOSIS — E1159 Type 2 diabetes mellitus with other circulatory complications: Secondary | ICD-10-CM | POA: Diagnosis not present

## 2020-08-18 DIAGNOSIS — E119 Type 2 diabetes mellitus without complications: Secondary | ICD-10-CM | POA: Diagnosis not present

## 2020-08-18 DIAGNOSIS — Z888 Allergy status to other drugs, medicaments and biological substances status: Secondary | ICD-10-CM | POA: Diagnosis not present

## 2020-08-18 DIAGNOSIS — M6281 Muscle weakness (generalized): Secondary | ICD-10-CM | POA: Diagnosis not present

## 2020-08-18 DIAGNOSIS — Z7984 Long term (current) use of oral hypoglycemic drugs: Secondary | ICD-10-CM | POA: Diagnosis not present

## 2020-08-18 DIAGNOSIS — I129 Hypertensive chronic kidney disease with stage 1 through stage 4 chronic kidney disease, or unspecified chronic kidney disease: Secondary | ICD-10-CM | POA: Diagnosis not present

## 2020-08-18 DIAGNOSIS — W19XXXA Unspecified fall, initial encounter: Secondary | ICD-10-CM | POA: Diagnosis present

## 2020-08-18 DIAGNOSIS — R262 Difficulty in walking, not elsewhere classified: Secondary | ICD-10-CM | POA: Diagnosis not present

## 2020-08-18 DIAGNOSIS — Z853 Personal history of malignant neoplasm of breast: Secondary | ICD-10-CM | POA: Diagnosis not present

## 2020-08-18 DIAGNOSIS — Z794 Long term (current) use of insulin: Secondary | ICD-10-CM | POA: Diagnosis not present

## 2020-08-18 DIAGNOSIS — R531 Weakness: Secondary | ICD-10-CM | POA: Diagnosis not present

## 2020-08-18 DIAGNOSIS — Z20822 Contact with and (suspected) exposure to covid-19: Secondary | ICD-10-CM | POA: Diagnosis not present

## 2020-08-18 DIAGNOSIS — R278 Other lack of coordination: Secondary | ICD-10-CM | POA: Diagnosis not present

## 2020-08-18 DIAGNOSIS — E7800 Pure hypercholesterolemia, unspecified: Secondary | ICD-10-CM | POA: Diagnosis not present

## 2020-08-18 DIAGNOSIS — N179 Acute kidney failure, unspecified: Secondary | ICD-10-CM | POA: Diagnosis not present

## 2020-08-18 DIAGNOSIS — E876 Hypokalemia: Secondary | ICD-10-CM | POA: Diagnosis not present

## 2020-08-18 DIAGNOSIS — J302 Other seasonal allergic rhinitis: Secondary | ICD-10-CM | POA: Diagnosis not present

## 2020-08-18 DIAGNOSIS — E1122 Type 2 diabetes mellitus with diabetic chronic kidney disease: Secondary | ICD-10-CM | POA: Diagnosis not present

## 2020-08-18 DIAGNOSIS — E785 Hyperlipidemia, unspecified: Secondary | ICD-10-CM | POA: Diagnosis not present

## 2020-08-18 DIAGNOSIS — R488 Other symbolic dysfunctions: Secondary | ICD-10-CM | POA: Diagnosis not present

## 2020-08-18 DIAGNOSIS — R2681 Unsteadiness on feet: Secondary | ICD-10-CM | POA: Diagnosis not present

## 2020-08-18 DIAGNOSIS — Z880 Allergy status to penicillin: Secondary | ICD-10-CM | POA: Diagnosis not present

## 2020-08-18 DIAGNOSIS — I10 Essential (primary) hypertension: Secondary | ICD-10-CM | POA: Diagnosis not present

## 2020-08-18 DIAGNOSIS — Z87891 Personal history of nicotine dependence: Secondary | ICD-10-CM | POA: Diagnosis not present

## 2020-08-18 DIAGNOSIS — F319 Bipolar disorder, unspecified: Secondary | ICD-10-CM | POA: Diagnosis present

## 2020-08-21 ENCOUNTER — Inpatient Hospital Stay
Admission: EM | Admit: 2020-08-21 | Discharge: 2020-08-24 | DRG: 683 | Disposition: A | Discharge status: Skilled Nursing Facility | Payer: Medicare Other | Attending: Internal Medicine | Admitting: Internal Medicine

## 2020-08-21 ENCOUNTER — Emergency Department: Payer: Medicare Other

## 2020-08-21 ENCOUNTER — Other Ambulatory Visit: Payer: Self-pay

## 2020-08-21 DIAGNOSIS — Z79890 Hormone replacement therapy (postmenopausal): Secondary | ICD-10-CM | POA: Diagnosis not present

## 2020-08-21 DIAGNOSIS — Z888 Allergy status to other drugs, medicaments and biological substances status: Secondary | ICD-10-CM | POA: Diagnosis not present

## 2020-08-21 DIAGNOSIS — Z90710 Acquired absence of both cervix and uterus: Secondary | ICD-10-CM | POA: Diagnosis not present

## 2020-08-21 DIAGNOSIS — R0902 Hypoxemia: Secondary | ICD-10-CM | POA: Diagnosis not present

## 2020-08-21 DIAGNOSIS — N183 Chronic kidney disease, stage 3 unspecified: Secondary | ICD-10-CM | POA: Diagnosis present

## 2020-08-21 DIAGNOSIS — Z853 Personal history of malignant neoplasm of breast: Secondary | ICD-10-CM

## 2020-08-21 DIAGNOSIS — R404 Transient alteration of awareness: Secondary | ICD-10-CM | POA: Diagnosis not present

## 2020-08-21 DIAGNOSIS — E7800 Pure hypercholesterolemia, unspecified: Secondary | ICD-10-CM | POA: Diagnosis present

## 2020-08-21 DIAGNOSIS — F319 Bipolar disorder, unspecified: Secondary | ICD-10-CM

## 2020-08-21 DIAGNOSIS — Z743 Need for continuous supervision: Secondary | ICD-10-CM | POA: Diagnosis not present

## 2020-08-21 DIAGNOSIS — Z79899 Other long term (current) drug therapy: Secondary | ICD-10-CM | POA: Diagnosis not present

## 2020-08-21 DIAGNOSIS — E785 Hyperlipidemia, unspecified: Secondary | ICD-10-CM | POA: Diagnosis not present

## 2020-08-21 DIAGNOSIS — N390 Urinary tract infection, site not specified: Secondary | ICD-10-CM

## 2020-08-21 DIAGNOSIS — Z1159 Encounter for screening for other viral diseases: Secondary | ICD-10-CM | POA: Diagnosis not present

## 2020-08-21 DIAGNOSIS — E119 Type 2 diabetes mellitus without complications: Secondary | ICD-10-CM

## 2020-08-21 DIAGNOSIS — Z20822 Contact with and (suspected) exposure to covid-19: Secondary | ICD-10-CM | POA: Diagnosis present

## 2020-08-21 DIAGNOSIS — Z794 Long term (current) use of insulin: Secondary | ICD-10-CM | POA: Diagnosis not present

## 2020-08-21 DIAGNOSIS — J302 Other seasonal allergic rhinitis: Secondary | ICD-10-CM | POA: Diagnosis present

## 2020-08-21 DIAGNOSIS — Z789 Other specified health status: Secondary | ICD-10-CM

## 2020-08-21 DIAGNOSIS — I10 Essential (primary) hypertension: Secondary | ICD-10-CM | POA: Diagnosis not present

## 2020-08-21 DIAGNOSIS — E876 Hypokalemia: Secondary | ICD-10-CM | POA: Diagnosis not present

## 2020-08-21 DIAGNOSIS — Z9049 Acquired absence of other specified parts of digestive tract: Secondary | ICD-10-CM | POA: Diagnosis not present

## 2020-08-21 DIAGNOSIS — R7989 Other specified abnormal findings of blood chemistry: Secondary | ICD-10-CM | POA: Diagnosis present

## 2020-08-21 DIAGNOSIS — R531 Weakness: Secondary | ICD-10-CM

## 2020-08-21 DIAGNOSIS — E1122 Type 2 diabetes mellitus with diabetic chronic kidney disease: Secondary | ICD-10-CM | POA: Diagnosis present

## 2020-08-21 DIAGNOSIS — Z7984 Long term (current) use of oral hypoglycemic drugs: Secondary | ICD-10-CM | POA: Diagnosis not present

## 2020-08-21 DIAGNOSIS — Z87891 Personal history of nicotine dependence: Secondary | ICD-10-CM

## 2020-08-21 DIAGNOSIS — R300 Dysuria: Secondary | ICD-10-CM | POA: Diagnosis not present

## 2020-08-21 DIAGNOSIS — I129 Hypertensive chronic kidney disease with stage 1 through stage 4 chronic kidney disease, or unspecified chronic kidney disease: Secondary | ICD-10-CM | POA: Diagnosis present

## 2020-08-21 DIAGNOSIS — M6281 Muscle weakness (generalized): Secondary | ICD-10-CM | POA: Diagnosis not present

## 2020-08-21 DIAGNOSIS — E039 Hypothyroidism, unspecified: Secondary | ICD-10-CM | POA: Diagnosis present

## 2020-08-21 DIAGNOSIS — R279 Unspecified lack of coordination: Secondary | ICD-10-CM | POA: Diagnosis not present

## 2020-08-21 DIAGNOSIS — F1021 Alcohol dependence, in remission: Secondary | ICD-10-CM | POA: Diagnosis present

## 2020-08-21 DIAGNOSIS — F32A Depression, unspecified: Secondary | ICD-10-CM | POA: Diagnosis present

## 2020-08-21 DIAGNOSIS — E1169 Type 2 diabetes mellitus with other specified complication: Secondary | ICD-10-CM

## 2020-08-21 DIAGNOSIS — W19XXXA Unspecified fall, initial encounter: Secondary | ICD-10-CM

## 2020-08-21 DIAGNOSIS — Y92009 Unspecified place in unspecified non-institutional (private) residence as the place of occurrence of the external cause: Secondary | ICD-10-CM | POA: Diagnosis not present

## 2020-08-21 DIAGNOSIS — Z880 Allergy status to penicillin: Secondary | ICD-10-CM | POA: Diagnosis not present

## 2020-08-21 DIAGNOSIS — N179 Acute kidney failure, unspecified: Principal | ICD-10-CM

## 2020-08-21 DIAGNOSIS — R0602 Shortness of breath: Secondary | ICD-10-CM | POA: Diagnosis not present

## 2020-08-21 LAB — COMPREHENSIVE METABOLIC PANEL
ALT: 12 U/L (ref 0–44)
AST: 23 U/L (ref 15–41)
Albumin: 3.3 g/dL — ABNORMAL LOW (ref 3.5–5.0)
Alkaline Phosphatase: 43 U/L (ref 38–126)
Anion gap: 13 (ref 5–15)
BUN: 18 mg/dL (ref 8–23)
CO2: 23 mmol/L (ref 22–32)
Calcium: 9.2 mg/dL (ref 8.9–10.3)
Chloride: 101 mmol/L (ref 98–111)
Creatinine, Ser: 1.36 mg/dL — ABNORMAL HIGH (ref 0.44–1.00)
GFR, Estimated: 41 mL/min — ABNORMAL LOW (ref >60)
Glucose, Bld: 153 mg/dL — ABNORMAL HIGH (ref 70–99)
Potassium: 3.3 mmol/L — ABNORMAL LOW (ref 3.5–5.1)
Sodium: 137 mmol/L (ref 135–145)
Total Bilirubin: 0.5 mg/dL (ref 0.3–1.2)
Total Protein: 7.1 g/dL (ref 6.5–8.1)

## 2020-08-21 LAB — CBC
HCT: 45.9 % (ref 36.0–46.0)
Hemoglobin: 15.1 g/dL — ABNORMAL HIGH (ref 12.0–15.0)
MCH: 29 pg (ref 26.0–34.0)
MCHC: 32.9 g/dL (ref 30.0–36.0)
MCV: 88.1 fL (ref 80.0–100.0)
Platelets: 215 10*3/uL (ref 150–400)
RBC: 5.21 MIL/uL — ABNORMAL HIGH (ref 3.87–5.11)
RDW: 13.2 % (ref 11.5–15.5)
WBC: 9.3 10*3/uL (ref 4.0–10.5)
nRBC: 0 % (ref 0.0–0.2)

## 2020-08-21 IMAGING — CR DG CHEST 2V
2 series · 2 of 2 positions shown · non-contrast
Comparison: [DATE]

CLINICAL DATA: Shortness of breath

EXAM:
CHEST - 2 VIEW

[chest lat]
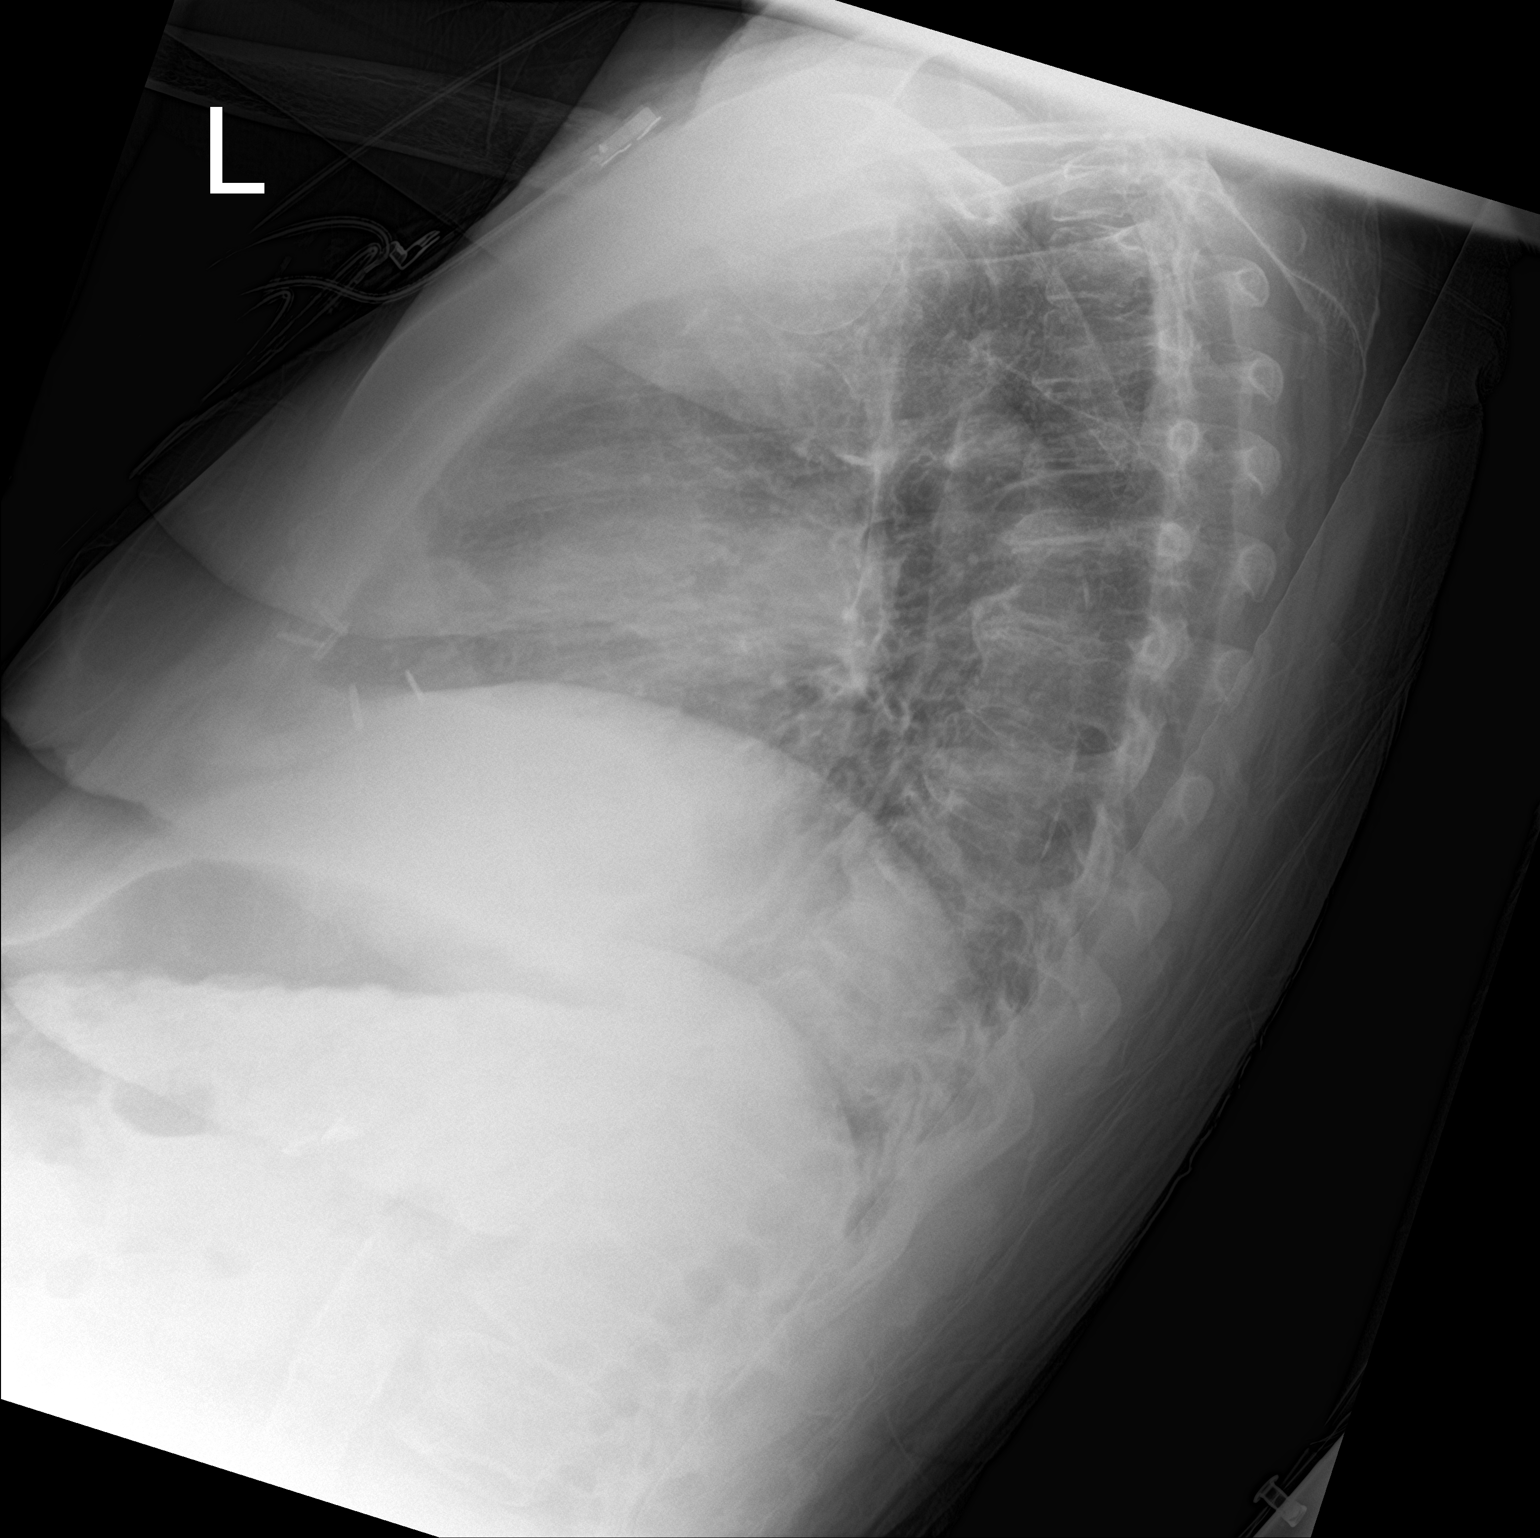

[chest ap]
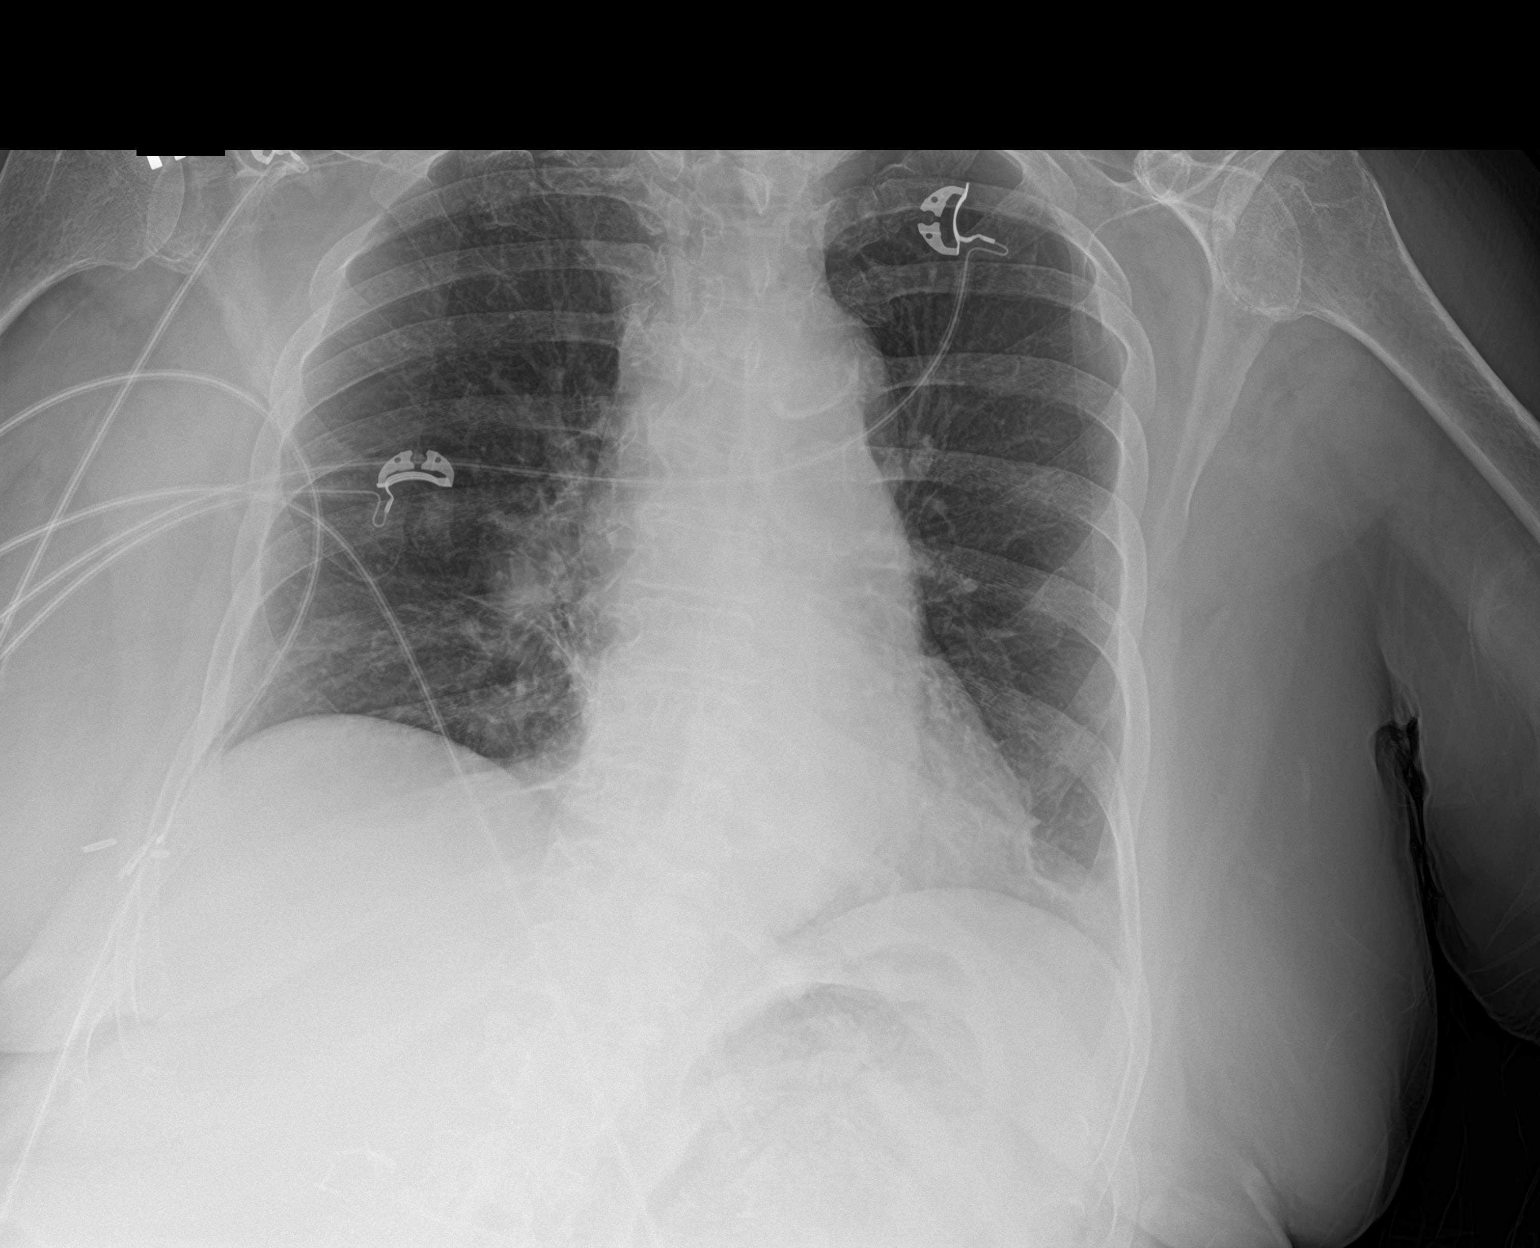

[2 of 2 positions shown; findings below may reference images not displayed]

FINDINGS: Scarring at the left base. No confluent airspace opacities
otherwise. Tortuosity of the aorta with calcifications. Heart is
normal size. No effusions or acute bony abnormality.
IMPRESSION: No active cardiopulmonary disease.

## 2020-08-21 NOTE — ED Provider Notes (Addendum)
[facility] Emergency Department Provider Note  Time seen: 6:01 PM  I have reviewed the triage vital signs and the nursing notes.   HISTORY  Chief Complaint Fall  HPI [name] is a 75 y.o. female with a past medical history of arthritis, hypertension, hyperlipidemia, depression, presents to the emergency department for weakness.  According to the patient she recently returned from rehab where she was for weakness and falls.  States she was sitting on the couch states she tried to readjust herself and slumped down onto the floor but was too weak to pick herself up.  Patient denies falling.  Denies hitting her head.  Denies any other pains or complaints.  Patient states she has had a slight cough initial room air saturation 93 to 94% denies any fever.  Denies LOC  Past Medical History:  Diagnosis Date  . Arthritis   . Breast cancer ([place])   . Cancer [facility]) 2004   right breast ca  . Diabetes mellitus without complication ([place])   . Hypercholesterolemia   . Hypertension   . Hypothyroid   . Seasonal allergies     Patient Active Problem List   Diagnosis Date Noted  . Bipolar disorder current episode depressed ([place]) 01/07/2020  . MDD (major depressive disorder) 08/10/2019  . MDD (major depressive disorder), recurrent episode, severe ([place]) 06/24/2019  . Pain due to onychomycosis of toenails of both feet 11/23/2018  . Bipolar 1 disorder, manic, moderate ([place]) 07/21/2018  . Bipolar 1 disorder, mixed, severe ([place]) 07/21/2018  . Bipolar 1 disorder with moderate mania ([place]) 07/20/2018  . Severe bipolar I disorder, current or most recent episode depressed ([place]) 07/07/2017  . PTSD (post-traumatic stress disorder) 07/07/2017  . 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 Type 2 diabetes mellitus without complication ([place]) 61/60/7371  . Essential hypertension 09/21/2014  . Hypothyroidism 09/21/2014  . Dyslipidemia  09/21/2014  . Bipolar I disorder, current or most recent episode manic, severe with mixed features ([place]) 09/20/2014    Past Surgical History:  Procedure Laterality Date  . ABDOMINAL HYSTERECTOMY    . BREAST BIOPSY Left    neg  . BREAST EXCISIONAL BIOPSY Right 2004   breast ca and lumpectomy  . BREAST LUMPECTOMY Right 2004   breast ca  . CHOLECYSTECTOMY    . COLONOSCOPY WITH PROPOFOL N/A 02/13/2017   Procedure: COLONOSCOPY WITH PROPOFOL;  Surgeon: Jonathon Bellows, MD;  Location: [facility] ENDOSCOPY;  Service: Gastroenterology;  Laterality: N/A;  . KIDNEY STONE SURGERY      Prior to Admission medications   Medication Sig Start Date End Date Taking? Authorizing Provider  clonazePAM (KLONOPIN) 1 MG tablet Take 1 tablet (1 mg total) by mouth 2 (two) times daily. 01/11/20   Clapacs, Madie Reno, MD  divalproex (DEPAKOTE ER) 500 MG 24 hr tablet Take 2 tablets (1,000 mg total) by mouth at bedtime. 01/11/20   Clapacs, Madie Reno, MD  enalapril (VASOTEC) 5 MG tablet Take 1 tablet (5 mg total) by mouth 2 (two) times daily. 01/11/20   Clapacs, Madie Reno, MD  FLUoxetine (PROZAC) 20 MG capsule Take 60 mg by mouth daily.    [provider]  glipiZIDE (GLUCOTROL XL) 10 MG 24 hr tablet Take 1 tablet (10 mg total) by mouth daily with breakfast. 01/12/20   Clapacs, Madie Reno, MD  insulin detemir (LEVEMIR) 100 UNIT/ML injection Inject 0.15 mLs (15 Units total) into the skin daily. 07/31/20   Charlynne Cousins, MD  LATUDA  40 MG TABS tablet Take 20 mg by mouth daily.  04/04/20   [provider]  levothyroxine (SYNTHROID) 88 MCG tablet Take 88 mcg by mouth daily before breakfast.    [provider]  linagliptin (TRADJENTA) 5 MG TABS tablet Take 5 mg by mouth 2 (two) times daily.     [provider]  loratadine (CLARITIN) 10 MG tablet Take 1 tablet (10 mg total) by mouth daily. 01/11/20   Clapacs, Madie Reno, MD  metFORMIN (GLUCOPHAGE) 500 MG tablet Take 2 tablets (1,000 mg total) by mouth 2 (two) times  daily with a meal. 07/31/20   Charlynne Cousins, MD  rosuvastatin (CRESTOR) 40 MG tablet Take 1 tablet (40 mg total) by mouth daily. 01/11/20   Clapacs, Madie Reno, MD    Allergies  Allergen Reactions  . Navane [Thiothixene] Other (See Comments)    Reaction:  Unknown  . Penicillins Rash    Has patient had a PCN reaction causing immediate rash, facial/tongue/throat swelling, SOB or lightheadedness with hypotension: No Has patient had a PCN reaction causing severe rash involving mucus membranes or skin necrosis: No Has patient had a PCN reaction that required hospitalization: No Has patient had a PCN reaction occurring within the last 10 years: No If all of the above answers are "NO", then may proceed with Cephalosporin use.     Family History  Problem Relation Age of Onset  . Bladder Cancer Neg Hx   . Kidney cancer Neg Hx     Social History Social History   Tobacco Use  . Smoking status: Former Smoker    Types: Cigarettes  . Smokeless tobacco: Never Used  Vaping Use  . Vaping Use: Never used  Substance Use Topics  . Alcohol use: No  . Drug use: No    Review of Systems Constitutional: Negative for fever.  Negative LOC. Cardiovascular: Negative for chest pain. Respiratory: Negative for shortness of breath. Gastrointestinal: Negative for abdominal pain, vomiting Musculoskeletal: Negative for musculoskeletal complaints Neurological: Negative for headache or head injury. All other ROS negative  ____________________________________________   PHYSICAL EXAM:  VITAL SIGNS: ED Triage Vitals  Enc Vitals Group     BP 08/21/20 1746 99/65     Pulse --      Resp 08/21/20 1746 19     Temp 08/21/20 1746 98 F (36.7 C)     Temp src --      SpO2 08/21/20 1746 93 %     Weight 08/21/20 1747 180 lb (81.6 kg)     Height 08/21/20 1747 5\' 2"  (1.575 m)     Head Circumference --      Peak Flow --      Pain Score 08/21/20 1747 0     Pain Loc --      Pain Edu? --      Excl. in [place]?  --    Constitutional: Awake alert, no distress.  Calm cooperative. Eyes: Normal exam ENT      Head: Normocephalic and atraumatic.      Mouth/Throat: Mucous membranes are moist. Cardiovascular: Normal rate, regular rhythm.  Respiratory: Normal respiratory effort without tachypnea nor retractions. Breath sounds are clear  Gastrointestinal: Soft and nontender. No distention.   Musculoskeletal: Nontender with normal range of motion in all extremities.  No pain elicited with range of motion. Neurologic:  Normal speech and language. No gross focal neurologic deficits  Skin:  Skin is warm, dry and intact.  Psychiatric: Mood and affect are normal.  ____________________________________________     RADIOLOGY  Chest x-ray is negative  ____________________________________________   INITIAL IMPRESSION / ASSESSMENT AND PLAN / ED COURSE  Pertinent labs & imaging results that were available during my care of the patient were reviewed by me and considered in my medical decision making (see chart for details).   Patient presents emergency department for weakness and fall.  Patient states that weakness has been an ongoing issue for weeks or months, states that is why she was at rehab.  Patient recently was discharged to rehab back home where she has home health aides.  It appears patient slumped onto the ground and did not fall per patient.  Did not hit her head.  She has no traumatic complaints.  Given the complaint of continued weakness we will obtain basic labs, 93% room air saturation currently we will obtain a chest x-ray as a precaution.  Patient agreeable plan of care.  No signs of trauma my examination.  Chest x-ray is negative.  Lab work is reassuring/baseline for the patient.  Patient continues to appear well.  We will discharge patient home.  Patient lives alone.  Never spoke to the patient's home health provider who states there is no way the patient can return home she is not able to  care for self in the least and does not have around-the-clock care at home.  She believes strongly that the patient needs to go to a rehab facility.  We will consult social work and PT to evaluate for possible SNF placement.  [name] was evaluated in Emergency Department on 08/21/2020 for the symptoms described in the history of present illness. She was evaluated in the context of the global COVID-19 pandemic, which necessitated consideration that the patient might be at risk for infection with the SARS-CoV-2 virus that causes COVID-19. Institutional protocols and algorithms that pertain to the evaluation of patients at risk for COVID-19 are in a state of rapid change based on information released by regulatory bodies including the CDC and federal and state organizations. These policies and algorithms were followed during the patient's care in the ED.  ____________________________________________   FINAL CLINICAL IMPRESSION(S) / ED DIAGNOSES  Weakness   Harvest Dark, MD 08/21/20 Joycelyn Rua, MD 08/21/20 2034

## 2020-08-21 NOTE — ED Triage Notes (Signed)
Pt here via EMS s/p questionable fall. Patient was discharged home from rehab today with homehealth care. Aids stepped out for a moment and patient was on the couch sitting. When they returned, patient was sitting on the floor at the base of the couch. Pt reports she just didn't want to sit on the couch anymore so she decided to sit on the floor. Pt does c/o dysuria.

## 2020-08-22 ENCOUNTER — Encounter: Payer: Self-pay | Admitting: Internal Medicine

## 2020-08-22 DIAGNOSIS — Z87891 Personal history of nicotine dependence: Secondary | ICD-10-CM | POA: Diagnosis not present

## 2020-08-22 DIAGNOSIS — W19XXXA Unspecified fall, initial encounter: Secondary | ICD-10-CM | POA: Diagnosis present

## 2020-08-22 DIAGNOSIS — E876 Hypokalemia: Secondary | ICD-10-CM | POA: Diagnosis present

## 2020-08-22 DIAGNOSIS — I129 Hypertensive chronic kidney disease with stage 1 through stage 4 chronic kidney disease, or unspecified chronic kidney disease: Secondary | ICD-10-CM | POA: Diagnosis present

## 2020-08-22 DIAGNOSIS — F319 Bipolar disorder, unspecified: Secondary | ICD-10-CM | POA: Diagnosis present

## 2020-08-22 DIAGNOSIS — R531 Weakness: Secondary | ICD-10-CM

## 2020-08-22 DIAGNOSIS — Z853 Personal history of malignant neoplasm of breast: Secondary | ICD-10-CM | POA: Diagnosis not present

## 2020-08-22 DIAGNOSIS — E785 Hyperlipidemia, unspecified: Secondary | ICD-10-CM | POA: Diagnosis present

## 2020-08-22 DIAGNOSIS — F1021 Alcohol dependence, in remission: Secondary | ICD-10-CM | POA: Diagnosis present

## 2020-08-22 DIAGNOSIS — E7800 Pure hypercholesterolemia, unspecified: Secondary | ICD-10-CM | POA: Diagnosis present

## 2020-08-22 DIAGNOSIS — Z880 Allergy status to penicillin: Secondary | ICD-10-CM | POA: Diagnosis not present

## 2020-08-22 DIAGNOSIS — N183 Chronic kidney disease, stage 3 unspecified: Secondary | ICD-10-CM | POA: Diagnosis present

## 2020-08-22 DIAGNOSIS — J302 Other seasonal allergic rhinitis: Secondary | ICD-10-CM | POA: Diagnosis present

## 2020-08-22 DIAGNOSIS — Z794 Long term (current) use of insulin: Secondary | ICD-10-CM | POA: Diagnosis not present

## 2020-08-22 DIAGNOSIS — Z20822 Contact with and (suspected) exposure to covid-19: Secondary | ICD-10-CM | POA: Diagnosis present

## 2020-08-22 DIAGNOSIS — E039 Hypothyroidism, unspecified: Secondary | ICD-10-CM | POA: Diagnosis present

## 2020-08-22 DIAGNOSIS — N179 Acute kidney failure, unspecified: Secondary | ICD-10-CM | POA: Diagnosis present

## 2020-08-22 DIAGNOSIS — Z79899 Other long term (current) drug therapy: Secondary | ICD-10-CM | POA: Diagnosis not present

## 2020-08-22 DIAGNOSIS — Y92009 Unspecified place in unspecified non-institutional (private) residence as the place of occurrence of the external cause: Secondary | ICD-10-CM | POA: Diagnosis not present

## 2020-08-22 DIAGNOSIS — Z888 Allergy status to other drugs, medicaments and biological substances status: Secondary | ICD-10-CM | POA: Diagnosis not present

## 2020-08-22 DIAGNOSIS — Z79890 Hormone replacement therapy (postmenopausal): Secondary | ICD-10-CM | POA: Diagnosis not present

## 2020-08-22 DIAGNOSIS — N390 Urinary tract infection, site not specified: Secondary | ICD-10-CM | POA: Diagnosis present

## 2020-08-22 DIAGNOSIS — E1122 Type 2 diabetes mellitus with diabetic chronic kidney disease: Secondary | ICD-10-CM | POA: Diagnosis present

## 2020-08-22 DIAGNOSIS — Z789 Other specified health status: Secondary | ICD-10-CM

## 2020-08-22 DIAGNOSIS — Z90710 Acquired absence of both cervix and uterus: Secondary | ICD-10-CM | POA: Diagnosis not present

## 2020-08-22 DIAGNOSIS — Z7984 Long term (current) use of oral hypoglycemic drugs: Secondary | ICD-10-CM | POA: Diagnosis not present

## 2020-08-22 DIAGNOSIS — Z9049 Acquired absence of other specified parts of digestive tract: Secondary | ICD-10-CM | POA: Diagnosis not present

## 2020-08-22 LAB — GLUCOSE, CAPILLARY
Glucose-Capillary: 103 mg/dL — ABNORMAL HIGH (ref 70–99)
Glucose-Capillary: 226 mg/dL — ABNORMAL HIGH (ref 70–99)
Glucose-Capillary: 95 mg/dL (ref 70–99)
Glucose-Capillary: 157 mg/dL — ABNORMAL HIGH (ref 70–99)

## 2020-08-22 LAB — RESP PANEL BY RT-PCR (FLU A&B, COVID) ARPGX2
Influenza A by PCR: NEGATIVE
Influenza B by PCR: NEGATIVE
SARS Coronavirus 2 by RT PCR: NEGATIVE

## 2020-08-22 LAB — URINALYSIS, COMPLETE (UACMP) WITH MICROSCOPIC
Bilirubin Urine: NEGATIVE
Glucose, UA: NEGATIVE mg/dL
Ketones, ur: 5 mg/dL — AB
Nitrite: NEGATIVE
Protein, ur: 100 mg/dL — AB
Specific Gravity, Urine: 1.016 (ref 1.005–1.030)
Squamous Epithelial / HPF: NONE SEEN (ref 0–5)
WBC, UA: >50 WBC/hpf — ABNORMAL HIGH (ref 0–5)
pH: 6 (ref 5.0–8.0)

## 2020-08-22 LAB — TSH: TSH: 2.81 u[IU]/mL (ref 0.350–4.500)

## 2020-08-22 LAB — CBG MONITORING, ED: Glucose-Capillary: 103 mg/dL — ABNORMAL HIGH (ref 70–99)

## 2020-08-22 LAB — CREATININE, SERUM
Creatinine, Ser: 1.29 mg/dL — ABNORMAL HIGH (ref 0.44–1.00)
GFR, Estimated: 43 mL/min — ABNORMAL LOW (ref >60)

## 2020-08-22 MED ORDER — INSULIN DETEMIR 100 UNIT/ML ~~LOC~~ SOLN
12.0000 [IU] | Freq: Every day | SUBCUTANEOUS | Status: DC
  Administered 2020-08-22 – 2020-08-24 (×3): 12 [IU] via SUBCUTANEOUS
  Filled 2020-08-22 (×3): qty 0.12

## 2020-08-22 MED ORDER — DIVALPROEX SODIUM ER 500 MG PO TB24
1000.0000 mg | ORAL_TABLET | Freq: Every day | ORAL | Status: DC
  Administered 2020-08-22 – 2020-08-23 (×2): 1000 mg via ORAL
  Filled 2020-08-22 (×3): qty 2

## 2020-08-22 MED ORDER — LEVOTHYROXINE SODIUM 88 MCG PO TABS
88.0000 ug | ORAL_TABLET | Freq: Every day | ORAL | Status: DC
  Administered 2020-08-22 – 2020-08-24 (×3): 88 ug via ORAL
  Filled 2020-08-22 (×3): qty 1

## 2020-08-22 MED ORDER — INSULIN ASPART 100 UNIT/ML ~~LOC~~ SOLN: 0.0000 [IU] | Freq: Every day | SUBCUTANEOUS | Status: DC

## 2020-08-22 MED ORDER — ACETAMINOPHEN 325 MG PO TABS: 650.0000 mg | ORAL_TABLET | Freq: Four times a day (QID) | ORAL | Status: DC | PRN

## 2020-08-22 MED ORDER — ONDANSETRON HCL 4 MG/2ML IJ SOLN: 4.0000 mg | Freq: Four times a day (QID) | INTRAMUSCULAR | Status: DC | PRN

## 2020-08-22 MED ORDER — ACETAMINOPHEN 650 MG RE SUPP: 650.0000 mg | Freq: Four times a day (QID) | RECTAL | Status: DC | PRN

## 2020-08-22 MED ORDER — CLONAZEPAM 1 MG PO TABS
1.0000 mg | ORAL_TABLET | Freq: Two times a day (BID) | ORAL | Status: DC
Start: 2020-08-22 — End: 2020-08-24
  Administered 2020-08-22 – 2020-08-24 (×5): 1 mg via ORAL
  Filled 2020-08-22 (×2): qty 1
  Filled 2020-08-22: qty 2
  Filled 2020-08-22 (×2): qty 1

## 2020-08-22 MED ORDER — ONDANSETRON HCL 4 MG PO TABS: 4.0000 mg | ORAL_TABLET | Freq: Four times a day (QID) | ORAL | Status: DC | PRN

## 2020-08-22 MED ORDER — INSULIN ASPART 100 UNIT/ML ~~LOC~~ SOLN
0.0000 [IU] | Freq: Three times a day (TID) | SUBCUTANEOUS | Status: DC
  Administered 2020-08-22: 5 [IU] via SUBCUTANEOUS
  Administered 2020-08-23: 2 [IU] via SUBCUTANEOUS
  Administered 2020-08-23: 3 [IU] via SUBCUTANEOUS
  Administered 2020-08-24: 2 [IU] via SUBCUTANEOUS
  Filled 2020-08-22 (×4): qty 1

## 2020-08-22 MED ORDER — SODIUM CHLORIDE 0.9 % IV SOLN: INTRAVENOUS | Status: AC

## 2020-08-22 MED ORDER — SODIUM CHLORIDE 0.9 % IV SOLN
1.0000 g | Freq: Once | INTRAVENOUS | Status: AC
  Administered 2020-08-22: 1 g via INTRAVENOUS
  Filled 2020-08-22: qty 10

## 2020-08-22 MED ORDER — SODIUM CHLORIDE 0.9 % IV BOLUS (SEPSIS)
1000.0000 mL | Freq: Once | INTRAVENOUS | Status: AC
  Administered 2020-08-22: 1000 mL via INTRAVENOUS

## 2020-08-22 MED ORDER — FLUOXETINE HCL 20 MG PO CAPS
60.0000 mg | ORAL_CAPSULE | Freq: Every day | ORAL | Status: DC
  Administered 2020-08-22 – 2020-08-24 (×3): 60 mg via ORAL
  Filled 2020-08-22 (×3): qty 3

## 2020-08-22 MED ORDER — ENOXAPARIN SODIUM 40 MG/0.4ML ~~LOC~~ SOLN
40.0000 mg | SUBCUTANEOUS | Status: DC
  Administered 2020-08-22 – 2020-08-24 (×3): 40 mg via SUBCUTANEOUS
  Filled 2020-08-22 (×3): qty 0.4

## 2020-08-22 MED ORDER — LURASIDONE HCL 20 MG PO TABS
20.0000 mg | ORAL_TABLET | Freq: Every day | ORAL | Status: DC
  Administered 2020-08-22 – 2020-08-24 (×3): 20 mg via ORAL
  Filled 2020-08-22 (×3): qty 1

## 2020-08-22 MED ORDER — ROSUVASTATIN CALCIUM 10 MG PO TABS
40.0000 mg | ORAL_TABLET | Freq: Every day | ORAL | Status: DC
  Administered 2020-08-22 – 2020-08-24 (×3): 40 mg via ORAL
  Filled 2020-08-22 (×3): qty 4

## 2020-08-22 MED ORDER — LORATADINE 10 MG PO TABS
10.0000 mg | ORAL_TABLET | Freq: Every day | ORAL | Status: DC
  Administered 2020-08-22 – 2020-08-24 (×3): 10 mg via ORAL
  Filled 2020-08-22 (×3): qty 1

## 2020-08-22 MED ORDER — SODIUM CHLORIDE 0.9 % IV SOLN
1.0000 g | INTRAVENOUS | Status: DC
  Administered 2020-08-23 – 2020-08-24 (×2): 1 g via INTRAVENOUS
  Filled 2020-08-22: qty 10
  Filled 2020-08-22: qty 1
  Filled 2020-08-22 (×2): qty 10

## 2020-08-22 NOTE — TOC Initial Note (Signed)
Transition of Care [facility]) - Initial/Assessment Note    Patient Details  Name: [name] MRN: [id number] Date of Birth: 03/07/1946  [facility]) CM/SW Contact:    Candie Chroman, LCSW Phone Number: 08/22/2020, 3:24 PM  Clinical Narrative:  CSW met with patient. No supports at bedside. CSW introduced role and explained that PT recommendations would be discussed. Patient just discharged from [facility]. She is agreeable to SNF placement again and prefers [facility]. Left message for admissions coordinator to notify. Patient thinks she may need to transition to long-term care after rehab due to frequent falls. Patient has questions about why she keeps falling and is unable to get herself up. Asked PT to come back tomorrow now that patient is more alert and oriented. No further concerns. CSW encouraged patient to contact CSW as needed. CSW will continue to follow patient for support and facilitate discharge to SNF once medically stable.                Expected Discharge Plan: Skilled Nursing Facility Barriers to Discharge: Continued Medical Work up,Insurance Authorization   Patient Goals and CMS Choice        Expected Discharge Plan and Services Expected Discharge Plan: [place] Acute Care Choice: [place] arrangements for the past 2 months: [place]                                      Prior Living Arrangements/Services Living arrangements for the past 2 months: [place] Lives with:: Self Patient language and need for interpreter reviewed:: Yes Do you feel safe going back to the place where you live?: Yes      Need for Family Participation in Patient Care: Yes (Comment) Care giver support system in place?: Yes (comment)   Criminal Activity/Legal Involvement Pertinent to Current Situation/Hospitalization: No - Comment as needed  Activities  of Daily Living Home Assistive Devices/Equipment: Walker (specify type),Raised toilet seat with rails,Shower chair with back ADL Screening (condition at time of admission) Patient's cognitive ability adequate to safely complete daily activities?: Yes Is the patient deaf or have difficulty hearing?: No Does the patient have difficulty seeing, even when wearing glasses/contacts?: No Does the patient have difficulty concentrating, remembering, or making decisions?: No Patient able to express need for assistance with ADLs?: Yes Does the patient have difficulty dressing or bathing?: No Independently performs ADLs?: Yes (appropriate for developmental age) Does the patient have difficulty walking or climbing stairs?: Yes Weakness of Legs: Both Weakness of Arms/Hands: Both  Permission Sought/Granted Permission sought to share information with : Facility Product/process development scientist (comment) Permission granted to share information with : Yes, Verbal Permission Granted  Share Information with NAME: [name]  Permission granted to share info w AGENCY: SNF's  Permission granted to share info w Relationship: Aide  Permission granted to share info w Contact Information: [phone]  Emotional Assessment Appearance:: Appears stated age Attitude/Demeanor/Rapport: Engaged,Gracious Affect (typically observed): Accepting,Appropriate,Calm,Pleasant Orientation: : Oriented to Self,Oriented to Place,Oriented to  Time,Oriented to Situation Alcohol / Substance Use: Not Applicable Psych Involvement: No (comment)  Admission diagnosis:  Weakness [R53.1] Acute UTI [N39.0] AKI (acute kidney injury) ([place]) [N17.9] Fall, initial encounter [W19.XXXA] Patient Active Problem List   Diagnosis Date Noted  . Generalized weakness 08/22/2020  . UTI (urinary tract infection) 08/22/2020  . Unable to care  for self 08/22/2020  . Bipolar 1 disorder ([place]) 08/22/2020  . Weakness 08/22/2020  . AKI (acute kidney injury)  ([place]) 08/22/2020  . Bipolar disorder current episode depressed ([place]) 01/07/2020  . MDD (major depressive disorder) 08/10/2019  . MDD (major depressive disorder), recurrent episode, severe ([place]) 06/24/2019  . Chronic kidney disease, stage 3 unspecified ([place]) 05/11/2019  . Pain due to onychomycosis of toenails of both feet 11/23/2018  . Bipolar 1 disorder, manic, moderate ([place]) 07/21/2018  . Bipolar 1 disorder, mixed, severe ([place]) 07/21/2018  . Bipolar 1 disorder with moderate mania ([place]) 07/20/2018  . Severe bipolar I disorder, current or most recent episode depressed ([place]) 07/07/2017  . PTSD (post-traumatic stress disorder) 07/07/2017  . 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 Type 2 diabetes mellitus without complication ([place]) 29/29/0903  . Essential hypertension 09/21/2014  . Hypothyroidism 09/21/2014  . Dyslipidemia 09/21/2014  . Bipolar I disorder, current or most recent episode manic, severe with mixed features ([place]) 09/20/2014   PCP:  Mechele Claude, FNP Pharmacy:   [place], [place] [address] Phone: [phone] Fax: [place], [place] - [place] [address] Phone: [phone] Fax: [phone]     Social Determinants of Health (SDOH) Interventions    Readmission Risk Interventions No flowsheet data found.

## 2020-08-22 NOTE — H&P (Signed)
History and Physical    Andrew Soria WFU:[id number] DOB: [birth year] DOA: 08/21/2020  PCP: Mechele Claude, FNP   Patient coming from: Home  I have personally briefly reviewed patient's old medical records in [place]  Chief Complaint: Weakness  HPI: [name] is a 75 y.o. female who lives alone with medical history significant for HTN, DM, hypothyroidism, bipolar 1 disorder, recently hospitalized from 3/7-3/16 for DKA and discharged to rehab who was brought into the emergency room following a fall at home in which she slid off the couch as she was trying to adjust to sitting position and was too weak to pick herself up.  She did not hit her head or get hurt in any way and denied loss of consciousness.  She has a slight cough but denies shortness of breath or fever or chills and denies chest pain.  Has had no nausea, vomiting, abdominal pain, change in bowel habits or dysuria.  She feels weak for the past several months. ED course: On arrival afebrile, BP 99/65, respirations 19 O2 sat 93% on room air.  Blood work significant for mild hypokalemia of 3.3.  Creatinine 1.36, increased from baseline of 0.89 chest 3 weeks prior.  Urinalysis showed large leukocytes esterase and many bacteria. covid and flu negative. Imaging: Chest x-ray showed no cardiopulmonary disease  Patient given Rocephin.  Hospitalist consulted for admission due to no safe discharge option.  Review of Systems: As per HPI otherwise all other systems on review of systems negative.    Past Medical History:  Diagnosis Date  . Arthritis   . Breast cancer ([place])   . Cancer [facility]) 2004   right breast ca  . Diabetes mellitus without complication ([place])   . Hypercholesterolemia   . Hypertension   . Hypothyroid   . Seasonal allergies     Past Surgical History:  Procedure Laterality Date  . ABDOMINAL HYSTERECTOMY    . BREAST BIOPSY Left    neg  . BREAST EXCISIONAL BIOPSY Right 2004   breast ca  and lumpectomy  . BREAST LUMPECTOMY Right 2004   breast ca  . CHOLECYSTECTOMY    . COLONOSCOPY WITH PROPOFOL N/A 02/13/2017   Procedure: COLONOSCOPY WITH PROPOFOL;  Surgeon: Jonathon Bellows, MD;  Location: [facility] ENDOSCOPY;  Service: Gastroenterology;  Laterality: N/A;  . KIDNEY STONE SURGERY       reports that she has quit smoking. Her smoking use included cigarettes. She has never used smokeless tobacco. She reports that she does not drink alcohol and does not use drugs.  Allergies  Allergen Reactions  . Navane [Thiothixene] Other (See Comments)    Reaction:  Unknown  . Penicillins Rash    Has patient had a PCN reaction causing immediate rash, facial/tongue/throat swelling, SOB or lightheadedness with hypotension: No Has patient had a PCN reaction causing severe rash involving mucus membranes or skin necrosis: No Has patient had a PCN reaction that required hospitalization: No Has patient had a PCN reaction occurring within the last 10 years: No If all of the above answers are "NO", then may proceed with Cephalosporin use.     Family History  Problem Relation Age of Onset  . Bladder Cancer Neg Hx   . Kidney cancer Neg Hx       Prior to Admission medications   Medication Sig Start Date End Date Taking? Authorizing Provider  clonazePAM (KLONOPIN) 1 MG tablet Take 1 tablet (1 mg total) by mouth 2 (two) times daily. 01/11/20  Clapacs, Madie Reno, MD  divalproex (DEPAKOTE ER) 500 MG 24 hr tablet Take 2 tablets (1,000 mg total) by mouth at bedtime. 01/11/20   Clapacs, Madie Reno, MD  enalapril (VASOTEC) 5 MG tablet Take 1 tablet (5 mg total) by mouth 2 (two) times daily. 01/11/20   Clapacs, Madie Reno, MD  FLUoxetine (PROZAC) 20 MG capsule Take 60 mg by mouth daily.    [provider]  glipiZIDE (GLUCOTROL XL) 10 MG 24 hr tablet Take 1 tablet (10 mg total) by mouth daily with breakfast. 01/12/20   Clapacs, Madie Reno, MD  insulin detemir (LEVEMIR) 100 UNIT/ML injection Inject 0.15 mLs (15 Units  total) into the skin daily. 07/31/20   Charlynne Cousins, MD  LATUDA 40 MG TABS tablet Take 20 mg by mouth daily.  04/04/20   [provider]  levothyroxine (SYNTHROID) 88 MCG tablet Take 88 mcg by mouth daily before breakfast.    [provider]  linagliptin (TRADJENTA) 5 MG TABS tablet Take 5 mg by mouth 2 (two) times daily.     [provider]  loratadine (CLARITIN) 10 MG tablet Take 1 tablet (10 mg total) by mouth daily. 01/11/20   Clapacs, Madie Reno, MD  metFORMIN (GLUCOPHAGE) 500 MG tablet Take 2 tablets (1,000 mg total) by mouth 2 (two) times daily with a meal. 07/31/20   Charlynne Cousins, MD  rosuvastatin (CRESTOR) 40 MG tablet Take 1 tablet (40 mg total) by mouth daily. 01/11/20   Gonzella Lex, MD    Physical Exam: Vitals:   08/21/20 2021 08/21/20 2308 08/22/20 0041 08/22/20 0200  BP: 111/67 100/60 101/72 95/80  Pulse: 85 81 79 79  Resp: (!) 22 17 16 16   Temp:      SpO2: 93% 92% 91% 96%  Weight:      Height:         Vitals:   08/21/20 2021 08/21/20 2308 08/22/20 0041 08/22/20 0200  BP: 111/67 100/60 101/72 95/80  Pulse: 85 81 79 79  Resp: (!) 22 17 16 16   Temp:      SpO2: 93% 92% 91% 96%  Weight:      Height:          Constitutional: Alert and oriented x 3 . Not in any apparent distress HEENT:      Head: Normocephalic and atraumatic.         Eyes: PERLA, EOMI, Conjunctivae are normal. Sclera is non-icteric.       Mouth/Throat: Mucous membranes are moist.       Neck: Supple with no signs of meningismus. Cardiovascular: Regular rate and rhythm. No murmurs, gallops, or rubs. 2+ symmetrical distal pulses are present . No JVD. No LE edema Respiratory: Respiratory effort normal .Lungs sounds clear bilaterally. No wheezes, crackles, or rhonchi.  Gastrointestinal: Soft, non tender, and non distended with positive bowel sounds.  Genitourinary: No CVA tenderness. Musculoskeletal: Nontender with normal range of motion in all extremities. No  cyanosis, or erythema of extremities. Neurologic:  Face is symmetric. Moving all extremities. No gross focal neurologic deficits . Skin: Skin is warm, dry.  No rash or ulcers Psychiatric: Mood and affect are normal    Labs on Admission: I have personally reviewed following labs and imaging studies  CBC: Recent Labs  Lab 08/21/20 1820  WBC 9.3  HGB 15.1*  HCT 45.9  MCV 88.1  PLT 443   Basic Metabolic Panel: Recent Labs  Lab 08/21/20 1820  NA 137  K 3.3*  CL  101  CO2 23  GLUCOSE 153*  BUN 18  CREATININE 1.36*  CALCIUM 9.2   GFR: Estimated Creatinine Clearance: 35.4 mL/min (A) (by C-G formula based on SCr of 1.36 mg/dL (H)). Liver Function Tests: Recent Labs  Lab 08/21/20 1820  AST 23  ALT 12  ALKPHOS 43  BILITOT 0.5  PROT 7.1  ALBUMIN 3.3*   No results for input(s): LIPASE, AMYLASE in the last 168 hours. No results for input(s): AMMONIA in the last 168 hours. Coagulation Profile: No results for input(s): INR, PROTIME in the last 168 hours. Cardiac Enzymes: No results for input(s): CKTOTAL, CKMB, CKMBINDEX, TROPONINI in the last 168 hours. BNP (last 3 results) No results for input(s): PROBNP in the last 8760 hours. HbA1C: No results for input(s): HGBA1C in the last 72 hours. CBG: No results for input(s): GLUCAP in the last 168 hours. Lipid Profile: No results for input(s): CHOL, HDL, LDLCALC, TRIG, CHOLHDL, LDLDIRECT in the last 72 hours. Thyroid Function Tests: No results for input(s): TSH, T4TOTAL, FREET4, T3FREE, THYROIDAB in the last 72 hours. Anemia Panel: No results for input(s): VITAMINB12, FOLATE, FERRITIN, TIBC, IRON, RETICCTPCT in the last 72 hours. Urine analysis:    Component Value Date/Time   COLORURINE YELLOW (A) 08/22/2020 0147   APPEARANCEUR CLOUDY (A) 08/22/2020 0147   APPEARANCEUR Clear 02/24/2017 1050   LABSPEC 1.016 08/22/2020 0147   LABSPEC 1.018 02/25/2014 0927   PHURINE 6.0 08/22/2020 0147   GLUCOSEU NEGATIVE 08/22/2020 0147    GLUCOSEU Negative 02/25/2014 0927   HGBUR MODERATE (A) 08/22/2020 0147   BILIRUBINUR NEGATIVE 08/22/2020 0147   BILIRUBINUR Negative 02/24/2017 1050   BILIRUBINUR Negative 02/25/2014 0927   KETONESUR 5 (A) 08/22/2020 0147   PROTEINUR 100 (A) 08/22/2020 0147   NITRITE NEGATIVE 08/22/2020 0147   LEUKOCYTESUR LARGE (A) 08/22/2020 0147   LEUKOCYTESUR 3+ 02/25/2014 0927    Radiological Exams on Admission: DG Chest 2 View  Result Date: 08/21/2020 CLINICAL DATA:  Shortness of breath EXAM: CHEST - 2 VIEW COMPARISON:  03/01/2020 FINDINGS: Scarring at the left base. No confluent airspace opacities otherwise. Tortuosity of the aorta with calcifications. Heart is normal size. No effusions or acute bony abnormality. IMPRESSION: No active cardiopulmonary disease. Electronically Signed   By: Rolm Baptise M.D.   On: 08/21/2020 18:40     Assessment/Plan 75 year old female who lives alone with history of HTN, DM, hypothyroidism, bipolar 1 disorder, recently hospitalized from 3/7-3/16 for DKA and discharged to rehab, discharged home on 4/3, presenting with a fall with inability to get up on her own.      Generalized weakness/physical deconditioning   Unable to care for self -Secondary to UTI as well as general physical deconditioning -Patient recently discharged from rehab -We will need PT and TOC consult.  May need to return to rehab    UTI (urinary tract infection) -IV Rocephin -Follow cultures  AKI -Creatinine 1.36 above baseline of 0.89, likely prerenal -IV hydration and monitor renal function    Essential hypertension -BP on admission low with systolic 99 -Hold home antihypertensives -IV hydration    Hypothyroidism -Continue levothyroxine    Type 2 diabetes mellitus without complication (HCC) -Sliding scale insulin coverage    Bipolar 1 disorder ([place]) -Continue home meds pending med rec    DVT prophylaxis: Lovenox  Code Status: full code  Family Communication:  none   Disposition Plan: SNF Consults called: none  Status:At the time of admission, it appears that the appropriate admission status for this patient is INPATIENT. This  is judged to be reasonable and necessary in order to provide the required intensity of service to ensure the patient's safety given the presenting symptoms, physical exam findings, and initial radiographic and laboratory data in the context of their  Comorbid conditions.   Patient requires inpatient status due to high intensity of service, high risk for further deterioration and high frequency of surveillance required.   I certify that at the point of admission it is my clinical judgment that the patient will require inpatient hospital care spanning beyond [place] MD [facility]     08/22/2020, 2:42 AM

## 2020-08-22 NOTE — ED Provider Notes (Signed)
  Physical Exam  BP 100/60 (BP Location: Right Arm)   Pulse 81   Temp 98 F (36.7 C)   Resp 17   Ht 5\' 2"  (1.575 m)   Wt 81.6 kg   SpO2 92%   BMI 32.92 kg/m   Physical Exam  ED Course/Procedures     Procedures  MDM  11:50 PM  Assumed care.  Patient was just discharged from rehab for generalized weakness and falls.  Upon arrival to home within a couple of hours of being there she had another fall.  She lives at home alone.  Labs unremarkable other than minimally elevated creatinine level.  Urinalysis pending.  Social work, PT consulted for help with placement.  Patient appears to have significant UTI.  Culture pending.  Will give Rocephin.  Given this an AKI, will discuss with medicine for admission.  2:42 AM Discussed patient's case with hospitalist, Dr. Damita Dunnings.  I have recommended admission and patient (and family if present) agree with this plan. Admitting physician will place admission orders.   I reviewed all nursing notes, vitals, pertinent previous records and reviewed/interpreted all EKGs, lab and urine results, imaging (as available).     Keven Osborn, Delice Bison, DO 08/22/20 [phone]

## 2020-08-22 NOTE — ED Notes (Signed)
Pt moved from hall bed to ED room 5 - transferred onto hospital bed with full linen change and bed bath given. External female cath placed and put on suction with peri care and brief change. Pt given yellow socks and warm blanket. Pt denies any further needs at this time.

## 2020-08-22 NOTE — Evaluation (Signed)
Physical Therapy Evaluation Patient Details Name: [name] MRN: [id number] DOB: 1945-12-11 Today's Date: 08/22/2020   History of Present Illness  [name] is a 62yoF  who was brought into the emergency room following a fall at home in which she slid off the couch as she was trying to adjust to sitting position and was too weak to pick herself up. PMH: HTN, DM, hypothyroidism, bipolar 1 disorder, recently hospitalized from 3/7-3/16 for DKA and discharged to rehab.  Clinical Impression  Pt admitted with above diagnosis. Pt currently with functional limitations due to the deficits listed below (see "PT Problem List"). Upon entry, pt in bed, awake, eating breakfast in bed. Author informs patient of plan to leave and come back after she has eaten. Author returns ~1 hour later, pt now resting, somnolent, difficulty awaking and maintaining a conversation, eyelids heavy. Pt is slurring words, hypophonic, and generally bradycardia. Author moves to assist patient to EOB in hopes to improve alertness, however this in ineffective. Pt very weak, maxA to EOB, unable to sit unsupported, then max+2 assist for back to bed. Pt says her head 'doesn't feel right.' but she is unable to say whether it feels like her blood glucose is low. Rn brought to room, tells [name] pt is not diabetic, then asks orientation questions which pt answers correctly except for month for which she gives "March" as a response. RN reports pt is likely more lethargic due to a medication given around breakfast time. Patient's performance this date reveals decreased ability, independence, and tolerance in performing all basic mobility required for performance of activities of daily living. Pt requires additional DME, close physical assistance, and cues for safe participate in mobility. Pt typically is better able to participate with our services at prior admissions, unclear why somnolence is a greater factor this date. Pt will benefit  from skilled PT intervention to increase independence and safety with basic mobility in preparation for discharge to the venue listed below.       Follow Up Recommendations SNF;Supervision for mobility/OOB;Supervision - Intermittent    Equipment Recommendations  None recommended by PT    Recommendations for Other Services       Precautions / Restrictions Precautions Precautions: Fall Restrictions Weight Bearing Restrictions: No      Mobility  Bed Mobility Overal bed mobility: Needs Assistance Bed Mobility: Supine to Sit;Sit to Supine     Supine to sit: Max assist;+2 for physical assistance Sit to supine: Max assist;+2 for physical assistance   General bed mobility comments: unable to sit unsuported; lethargic, "head doesn't feel right"    Transfers Overall transfer level:  (eferred due to lethargy adn severe weakness)                  Ambulation/Gait                Stairs            Wheelchair Mobility    Modified Rankin (Stroke Patients Only)       Balance                                             Pertinent Vitals/Pain Pain Assessment: No/denies pain    Home Living Family/patient expects to be discharged to:: Private residence Living Arrangements: Alone   Type of Home: Apartment Home Access: Stairs to enter Entrance Stairs-Rails: None Entrance  Stairs-Number of Steps: 1 small step to access apartment area Home Layout: One level Home Equipment: Clinical cytogeneticist - 4 wheels;Walker - 2 wheels Additional Comments: Pt says she is living in an apartment for seniors now; Was only home x1d s/p DC from [place] facility.    Prior Function                 Hand Dominance        Extremity/Trunk Assessment   Upper Extremity Assessment Upper Extremity Assessment: Generalized weakness    Lower Extremity Assessment Lower Extremity Assessment: Generalized weakness       Communication      Cognition  Arousal/Alertness: Awake/alert Behavior During Therapy: WFL for tasks assessed/performed Overall Cognitive Status: Within Functional Limits for tasks assessed                                        General Comments      Exercises     Assessment/Plan    PT Assessment Patient needs continued PT services  PT Problem List Decreased strength;Decreased range of motion;Decreased activity tolerance;Decreased balance;Decreased mobility;Decreased cognition       PT Treatment Interventions DME instruction;Gait training;Functional mobility training;Therapeutic activities;Therapeutic exercise;Balance training;Neuromuscular re-education;Patient/family education;Cognitive remediation    PT Goals (Current goals can be found in the Care Plan section)  Acute Rehab PT Goals PT Goal Formulation: Patient unable to participate in goal setting Time For Goal Achievement: 09/05/20 Potential to Achieve Goals: Fair    Frequency Min 2X/week   Barriers to discharge Decreased caregiver support      Co-evaluation               AM-PAC PT "6 Clicks" Mobility  Outcome Measure Help needed turning from your back to your side while in a flat bed without using bedrails?: Total Help needed moving from lying on your back to sitting on the side of a flat bed without using bedrails?: Total Help needed moving to and from a bed to a chair (including a wheelchair)?: Total Help needed standing up from a chair using your arms (e.g., wheelchair or bedside chair)?: Total Help needed to walk in hospital room?: Total Help needed climbing 3-5 steps with a railing? : Total 6 Click Score: 6    End of Session   Activity Tolerance: Patient limited by lethargy Patient left: in bed;with bed alarm set Nurse Communication: Mobility status (Pt appears lathargic compared to earlier in day, RN made aware.) PT Visit Diagnosis: Muscle weakness (generalized) (M62.81);Difficulty in walking, not elsewhere  classified (R26.2);History of falling (Z91.81)    Time: 1036-1050 PT Time Calculation (min) (ACUTE ONLY): 14 min   Charges:   PT Evaluation $PT Eval Moderate Complexity: 1 Mod          1:44 PM, 08/22/20 Etta Grandchild, PT, DPT Physical Therapist - [facility]  [phone] ([place])   Lasker C 08/22/2020, 1:38 PM

## 2020-08-22 NOTE — Progress Notes (Signed)
No charge note  Patient seen and examined this morning, admitted overnight, H&P reviewed and agree with the assessment and plan.  This is a 75 year old female who lives alone, history of HTN, DM 2, hypothyroidism, bipolar 1 disorder, who comes into the hospital with generalized weakness and a fall at home, unable to get up on her own.  She was recently hospitalized about a month ago for DKA, she tells me she was discharged to rehab for a few days, then got moved to another rehab for a few days and eventually discharged home.  She was home for 1 day, became too weak to get up and came back to the hospital.  She denies any fever or chills but does complain of significant dysuria at home.  She lives alone   Patient feeling relatively well this morning, still complains of some dysuria.  No abdominal pain, no nausea or vomiting.  No chest pain.  Principal problem Generalized weakness, physical deconditioning -likely due to UTI as well as general physical deconditioning in the setting of recent illness.  Supportive treatment for now, antibiotics, fluids, PT to see.  She appears to be dehydrated as well as AKI as well as hemoconcentration  Active problems UTI-continue IV ceftriaxone, cultures were sent and pending.  Narrow antibiotics based on culture results  Acute kidney injury-creatinine on admission 1.36 above her baseline of 0.89, likely prerenal in the setting of poor p.o. intake.  Continue fluids. Creatinine improving at 1.29 this morning.  Continue to monitor.  Essential hypertension-hypotensive on admission with systolic in the 63W, hold home antihypertensives  Hypothyroidism-continue Synthroid, most recent TSH 0.6 in November 2021, will repeat given profound weakness  Bipolar disorder-continue home medications pending medication reconciliation which has not been done yet even though she was admitted overnight  Type 2 diabetes mellitus -placed on sliding scale  Hyperlipidemia-resume statin  once medication reconciliation is completed  CBG (last 3)  Recent Labs    08/22/20 0318 08/22/20 0809  GLUCAP 103* 103*   Scheduled Meds: . clonazePAM  1 mg Oral BID  . enoxaparin (LOVENOX) injection  40 mg Subcutaneous Q24H  . insulin aspart  0-15 Units Subcutaneous TID WC  . insulin aspart  0-5 Units Subcutaneous QHS  . levothyroxine  88 mcg Oral Q0600   Continuous Infusions: . sodium chloride 100 mL/hr at 08/22/20 0314  . [START ON 08/23/2020] cefTRIAXone (ROCEPHIN)  IV     PRN Meds:.acetaminophen **OR** acetaminophen, ondansetron **OR** ondansetron (ZOFRAN) IV   Perley Arthurs M. Cruzita Lederer, MD, PhD [facility]  Between 7 am - 7 pm you can contact me via Amion (for emergencies) or Alexis (non urgent matters).  I am not available 7 pm - 7 am, please contact night coverage MD/APP via Amion

## 2020-08-22 NOTE — NC FL2 (Signed)
[place] LEVEL OF CARE SCREENING TOOL     IDENTIFICATION  Patient Name: [name] Birthdate: 10-Jan-1946 Sex: female Admission Date (Current Location): 08/21/2020  [place] and [place] Number:  Engineering geologist and Address:  [facility], [address] [place], [place] [address]      Provider Number: [id number]  Attending Physician Name and Address:  Caren Griffins, MD  Relative Name and Phone Number:       Current Level of Care: Hospital Recommended Level of Care: [place] Prior Approval Number:    Date Approved/Denied:   PASRR Number: [id number] F. Expires 4/14.  Discharge Plan: SNF    Current Diagnoses: Patient Active Problem List   Diagnosis Date Noted  . Generalized weakness 08/22/2020  . UTI (urinary tract infection) 08/22/2020  . Unable to care for self 08/22/2020  . Bipolar 1 disorder ([place]) 08/22/2020  . Weakness 08/22/2020  . AKI (acute kidney injury) ([place]) 08/22/2020  . Bipolar disorder current episode depressed ([place]) 01/07/2020  . MDD (major depressive disorder) 08/10/2019  . MDD (major depressive disorder), recurrent episode, severe ([place]) 06/24/2019  . Chronic kidney disease, stage 3 unspecified ([place]) 05/11/2019  . Pain due to onychomycosis of toenails of both feet 11/23/2018  . Bipolar 1 disorder, manic, moderate ([place]) 07/21/2018  . Bipolar 1 disorder, mixed, severe ([place]) 07/21/2018  . Bipolar 1 disorder with moderate mania ([place]) 07/20/2018  . Severe bipolar I disorder, current or most recent episode depressed ([place]) 07/07/2017  . PTSD (post-traumatic stress disorder) 07/07/2017  . 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 Type 2 diabetes mellitus without complication ([place]) 87/56/4332  . Essential hypertension 09/21/2014  . Hypothyroidism 09/21/2014  . Dyslipidemia 09/21/2014  . Bipolar I disorder, current or most recent episode manic, severe  with mixed features ([place]) 09/20/2014    Orientation RESPIRATION BLADDER Height & Weight     Self,Time,Situation,Place  Normal Continent,External catheter Weight: 180 lb (81.6 kg) Height:  5\' 2"  (157.5 cm)  BEHAVIORAL SYMPTOMS/MOOD NEUROLOGICAL BOWEL NUTRITION STATUS   (None)  (None) Continent Diet (Heart healthy/carb modified)  AMBULATORY STATUS COMMUNICATION OF NEEDS Skin   Extensive Assist Verbally Bruising                       Personal Care Assistance Level of Assistance  Bathing,Feeding,Dressing Bathing Assistance: Limited assistance Feeding assistance: Limited assistance Dressing Assistance: Limited assistance     Functional Limitations Info  Sight,Hearing,Speech Sight Info: Adequate Hearing Info: Adequate Speech Info: Adequate    SPECIAL CARE FACTORS FREQUENCY  PT (By licensed PT),OT (By licensed OT)     PT Frequency: 5 x week OT Frequency: 5 x week            Contractures Contractures Info: Not present    Additional Factors Info  Code Status,Allergies,Psychotropic Code Status Info: Full code Allergies Info: Navane (Thiothixene), Penicillins Psychotropic Info: Bipolar I Disorder         Current Medications (08/22/2020):  This is the current hospital active medication list Current Facility-Administered Medications  Medication Dose Route Frequency Provider Last Rate Last Admin  . 0.9 %  sodium chloride infusion   Intravenous Continuous Athena Masse, MD 100 mL/hr at 08/22/20 1216 New Bag at 08/22/20 1216  . acetaminophen (TYLENOL) tablet 650 mg  650 mg Oral Q6H PRN Athena Masse, MD       Or  . acetaminophen (TYLENOL) suppository 650 mg  650 mg  Rectal Q6H PRN Athena Masse, MD      . Derrill Memo ON 08/23/2020] cefTRIAXone (ROCEPHIN) 1 g in sodium chloride 0.9 % 100 mL IVPB  1 g Intravenous Q24H Judd Gaudier V, MD      . clonazePAM Bobbye [place]) tablet 1 mg  1 mg Oral BID Athena Masse, MD   1 mg at 08/22/20 0904  . divalproex (DEPAKOTE ER) 24 hr tablet  1,000 mg  1,000 mg Oral QHS Gherghe, Costin M, MD      . enoxaparin (LOVENOX) injection 40 mg  40 mg Subcutaneous Q24H Athena Masse, MD   40 mg at 08/22/20 0904  . FLUoxetine (PROZAC) capsule 60 mg  60 mg Oral Daily Gherghe, Costin M, MD      . insulin aspart (novoLOG) injection 0-15 Units  0-15 Units Subcutaneous TID WC Athena Masse, MD   5 Units at 08/22/20 1217  . insulin aspart (novoLOG) injection 0-5 Units  0-5 Units Subcutaneous QHS Judd Gaudier V, MD      . insulin detemir (LEVEMIR) injection 12 Units  12 Units Subcutaneous Daily Caren Griffins, MD      . levothyroxine (SYNTHROID) tablet 88 mcg  88 mcg Oral Q0600 Athena Masse, MD   88 mcg at 08/22/20 0451  . loratadine (CLARITIN) tablet 10 mg  10 mg Oral Daily Gherghe, Costin M, MD      . lurasidone (LATUDA) tablet 20 mg  20 mg Oral Daily Caren Griffins, MD      . ondansetron [facility]) tablet 4 mg  4 mg Oral Q6H PRN Athena Masse, MD       Or  . ondansetron [facility]) injection 4 mg  4 mg Intravenous Q6H PRN Athena Masse, MD      . rosuvastatin (CRESTOR) tablet 40 mg  40 mg Oral Daily Caren Griffins, MD         Discharge Medications: Please see discharge summary for a list of discharge medications.  Relevant Imaging Results:  Relevant Lab Results:   Additional Information SS#: [id number]. Fully vaccinated. Unsure of first 2 vaccination dates but got Moderna booster on 07/27/20. Was at [facility] 3/16-4/4. Thinks she may need to transition to LTC due to frequent falls at home.  Candie Chroman, LCSW

## 2020-08-23 LAB — CBC
HCT: 42.1 % (ref 36.0–46.0)
Hemoglobin: 14.2 g/dL (ref 12.0–15.0)
MCH: 29.5 pg (ref 26.0–34.0)
MCHC: 33.7 g/dL (ref 30.0–36.0)
MCV: 87.5 fL (ref 80.0–100.0)
Platelets: 192 10*3/uL (ref 150–400)
RBC: 4.81 MIL/uL (ref 3.87–5.11)
RDW: 13.2 % (ref 11.5–15.5)
WBC: 7.3 10*3/uL (ref 4.0–10.5)
nRBC: 0 % (ref 0.0–0.2)

## 2020-08-23 LAB — GLUCOSE, CAPILLARY
Glucose-Capillary: 116 mg/dL — ABNORMAL HIGH (ref 70–99)
Glucose-Capillary: 153 mg/dL — ABNORMAL HIGH (ref 70–99)
Glucose-Capillary: 124 mg/dL — ABNORMAL HIGH (ref 70–99)
Glucose-Capillary: 94 mg/dL (ref 70–99)

## 2020-08-23 LAB — BASIC METABOLIC PANEL
Anion gap: 10 (ref 5–15)
BUN: 13 mg/dL (ref 8–23)
CO2: 26 mmol/L (ref 22–32)
Calcium: 8.7 mg/dL — ABNORMAL LOW (ref 8.9–10.3)
Chloride: 104 mmol/L (ref 98–111)
Creatinine, Ser: 0.97 mg/dL (ref 0.44–1.00)
GFR, Estimated: >60 mL/min (ref >60)
Glucose, Bld: 101 mg/dL — ABNORMAL HIGH (ref 70–99)
Potassium: 3.1 mmol/L — ABNORMAL LOW (ref 3.5–5.1)
Sodium: 140 mmol/L (ref 135–145)

## 2020-08-23 LAB — MAGNESIUM: Magnesium: 1.6 mg/dL — ABNORMAL LOW (ref 1.7–2.4)

## 2020-08-23 MED ORDER — POTASSIUM CHLORIDE CRYS ER 20 MEQ PO TBCR
40.0000 meq | EXTENDED_RELEASE_TABLET | Freq: Once | ORAL | Status: AC
  Administered 2020-08-23: 40 meq via ORAL
  Filled 2020-08-23: qty 2

## 2020-08-23 MED ORDER — MAGNESIUM SULFATE 2 GM/50ML IV SOLN
2.0000 g | Freq: Once | INTRAVENOUS | Status: AC
  Administered 2020-08-23: 2 g via INTRAVENOUS
  Filled 2020-08-23: qty 50

## 2020-08-23 NOTE — Progress Notes (Signed)
Physical Therapy Treatment Patient Details Name: [name] MRN: [id number] DOB: 10-09-1945 Today's Date: 08/23/2020    History of Present Illness [name] is a 19yoF  who was brought into the emergency room following a fall at home in which she slid off the couch as she was trying to adjust to sitting position and was too weak to pick herself up. PMH: HTN, DM, hypothyroidism, bipolar 1 disorder, recently hospitalized from 3/7-3/16 for DKA and discharged to rehab.    PT Comments    Pt was supine in bed with HOB elevated ~ 20 degrees upon arriving. She is A and oriented x 2 but once reoriented was oriented x 4 throughout remainder of session. She agrees to PT session with a little encouragement. Endorsed 6/10 pain in low back however agrees to OOB activity. Was able to roll L to short sit with mod assist of one. Sat EOB with supervision prior to mod assist to stand to RW and ambulate 3 ft to [facility]. Had successful BM prior to ambulation back to bed. Poor standing balance and endurance however overall tolerated well. Pt will greatly benefit form rehab at DC. Pt is agreeable. Acute PT will continue to follow per POC.     Follow Up Recommendations  SNF;Supervision/Assistance - 24 hour     Equipment Recommendations  Other (comment) (defer to next level of care)       Precautions / Restrictions Precautions Precautions: Fall Restrictions Weight Bearing Restrictions: No    Mobility  Bed Mobility Overal bed mobility: Needs Assistance Bed Mobility: Supine to Sit;Sit to Supine     Supine to sit: Mod assist Sit to supine: Max assist   General bed mobility comments: performed rolling L to short sit due to LBP. required min assist to roll but mod assist to achieve EOB short sit.    Transfers Overall transfer level: Needs assistance Equipment used: Rolling walker (2 wheeled) Transfers: Sit to/from Stand Sit to Stand: Mod assist         General transfer comment: mod assist  to stand from slightly elevated bed height and BSC height  Ambulation/Gait Ambulation/Gait assistance: Mod assist Gait Distance (Feet): 3 Feet Assistive device: Rolling walker (2 wheeled) Gait Pattern/deviations: Step-to pattern;Trunk flexed;Shuffle Gait velocity: decreased   General Gait Details: pt has poor standing posture however was able to take steps to/from [facility]       Balance Overall balance assessment: Needs assistance Sitting-balance support: Feet supported;Bilateral upper extremity supported Sitting balance-Leahy Scale: Fair Sitting balance - Comments: Supervision for sitting EOB and BSC   Standing balance support: Bilateral upper extremity supported Standing balance-Leahy Scale: Poor Standing balance comment: reliant on RW, difficult to achieve upright         Cognition Arousal/Alertness: Awake/alert Behavior During Therapy: WFL for tasks assessed/performed Overall Cognitive Status: Within Functional Limits for tasks assessed    General Comments: Pt is A and oriented x 2. was able to follow commands consistently and is cooperative throughout.             Pertinent Vitals/Pain Pain Assessment: 0-10 Pain Score: 6  Pain Location: LBP Pain Descriptors / Indicators: Aching;Sore Pain Intervention(s): Limited activity within patient's tolerance;Monitored during session;Premedicated before session;Repositioned           PT Goals (current goals can now be found in the care plan section) Acute Rehab PT Goals Patient Stated Goal: rehab then home Progress towards PT goals: Progressing toward goals    Frequency    Min 2X/week  PT Plan Current plan remains appropriate       AM-PAC PT "6 Clicks" Mobility   Outcome Measure  Help needed turning from your back to your side while in a flat bed without using bedrails?: A Lot Help needed moving from lying on your back to sitting on the side of a flat bed without using bedrails?: A Lot Help needed moving  to and from a bed to a chair (including a wheelchair)?: A Lot Help needed standing up from a chair using your arms (e.g., wheelchair or bedside chair)?: A Lot Help needed to walk in hospital room?: A Lot Help needed climbing 3-5 steps with a railing? : Total 6 Click Score: 11    End of Session Equipment Utilized During Treatment: Gait belt Activity Tolerance: Patient tolerated treatment well Patient left: in bed;with bed alarm set Nurse Communication: Mobility status PT Visit Diagnosis: Muscle weakness (generalized) (M62.81);Difficulty in walking, not elsewhere classified (R26.2);History of falling (Z91.81)     Time: 2956-2130 PT Time Calculation (min) (ACUTE ONLY): 25 min  Charges:  $Therapeutic Activity: 23-37 mins                     Julaine Fusi PTA 08/23/20, 10:02 AM

## 2020-08-23 NOTE — TOC Progression Note (Addendum)
Transition of Care [facility]) - Progression Note    Patient Details  Name: [name] MRN: [id number] Date of Birth: 1945-06-24  Transition of Care [facility]) CM/SW [place], LCSW Phone Number: 08/23/2020, 10:25 AM  Clinical Narrative:  [facility] admissions coordinator is reviewing referral. If they are unable to accept, [facility] has offered a bed.   4:50 pm: [facility] unable to make a bed offer. Patient is agreeable to [facility]. They should have a bed tomorrow. Uploaded clinicals into [facility] portal to start [place]. [employer] faxed COVID vaccine information. Moderna: 09/29/19, 10/27/19. Booster done 07/27/20. Faxed to SNF admissions coordinator.  Expected Discharge Plan: [place] Barriers to Discharge: Continued Medical Work up,Insurance Authorization  Expected Discharge Plan and Services Expected Discharge Plan: [place] Choice: [place] arrangements for the past 2 months: [place]                                       Social Determinants of Health (SDOH) Interventions    Readmission Risk Interventions No flowsheet data found.

## 2020-08-23 NOTE — Progress Notes (Addendum)
PROGRESS NOTE    Norine Reddington  GMW:[id number] DOB: Jan 04, 1946 DOA: 08/21/2020 PCP: Mechele Claude, FNP   Brief Narrative: Taken from prior notes and H&P. Patient seen and examined this morning, admitted overnight, H&P reviewed and agree with the assessment and plan.  This is a 75 year old female who lives alone, history of HTN, DM 2, hypothyroidism, bipolar 1 disorder, who comes into the hospital with generalized weakness and a fall at home, unable to get up on her own.  She was recently hospitalized about a month ago for DKA, she tells me she was discharged to rehab for a few days, then got moved to another rehab for a few days and eventually discharged home.  She was home for 1 day, became too weak to get up and came back to the hospital.  She denies any fever or chills but does complain of significant dysuria at home.  She lives alone. PT recommended SNF placement, had 1 bed offer-pending insurance authorization. Covid test ordered.  Subjective: Patient continued to feel little weak, no dizziness, stating that her legs feel weak.  Assessment & Plan:   Principal Problem:   Generalized weakness Active Problems:   Essential hypertension   Hypothyroidism   Type 2 diabetes mellitus without complication (HCC)   Chronic kidney disease, stage 3 unspecified (HCC)   UTI (urinary tract infection)   Unable to care for self   Bipolar 1 disorder (HCC)   Weakness   AKI (acute kidney injury) ([place])  Generalized weakness, physical deconditioning -likely due to UTI as well as general physical deconditioning in the setting of recent illness. PT recommended SNF placement-had bed offer, pending insurance authorization.  UTI.  Urine cultures with more than 100,000 colonies of E. coli-pending susceptibility. -Continue with ceftriaxone-we will de-escalate once susceptibility results are available.  Hypokalemia/hypomagnesemia. -Replete electrolytes and monitor  AKI.  Resolved  Essential  hypertension.  Blood pressure within goal. Continue holding home antihypertensives-can be restarted if blood pressure started trending up.  Hypothyroidism.  TSH within normal limit. -Continue home dose of Synthroid.  Bipolar disorder. -Continue home meds  Type 2 diabetes mellitus. -SSI  Objective: Vitals:   08/23/20 0405 08/23/20 0741 08/23/20 1140 08/23/20 1502  BP: 126/80 123/81 130/87 103/72  Pulse: 75 68 72 73  Resp: 20 17 18    Temp: 97.7 F (36.5 C) 97.9 F (36.6 C) 97.7 F (36.5 C) (!) 97.3 F (36.3 C)  TempSrc: Oral Oral Oral Oral  SpO2: 93% 93% 97% 96%  Weight:      Height:        Intake/Output Summary (Last 24 hours) at 08/23/2020 1805 Last data filed at 08/23/2020 1352 Gross per 24 hour  Intake 1512.36 ml  Output 2201 ml  Net -688.64 ml   Filed Weights   08/21/20 1747  Weight: 81.6 kg    Examination:  General exam: Appears calm and comfortable  Respiratory system: Clear to auscultation. Respiratory effort normal. Cardiovascular system: S1 & S2 heard, RRR. No JVD, murmurs, rubs, gallops or clicks. Gastrointestinal system: Soft, nontender, nondistended, bowel sounds positive. Central nervous system: Alert and oriented. No focal neurological deficits.Symmetric 5 x 5 power. Extremities: No edema, no cyanosis, pulses intact and symmetrical. Psychiatry: Judgement and insight appear normal. Mood & affect appropriate.    DVT prophylaxis: Lovenox Code Status: Full Family Communication: Discussed with patient Disposition Plan:  Status is: Inpatient  Remains inpatient appropriate because:Inpatient level of care appropriate due to severity of illness   Dispo: The patient is from:  Home              Anticipated d/c is to: SNF              Patient currently is medically stable to d/c.   Difficult to place patient No               Level of care: Med-Surg  All the records are reviewed and case discussed with Care Management/Social Worker. Management plans  discussed with the patient, nursing and they are in agreement.  Consultants:   None  Procedures:  Antimicrobials:  Ceftriaxone  Data Reviewed: I have personally reviewed following labs and imaging studies  CBC: Recent Labs  Lab 08/21/20 1820 08/23/20 0417  WBC 9.3 7.3  HGB 15.1* 14.2  HCT 45.9 42.1  MCV 88.1 87.5  PLT 215 263   Basic Metabolic Panel: Recent Labs  Lab 08/21/20 1820 08/22/20 0324 08/23/20 0417  NA 137  --  140  K 3.3*  --  3.1*  CL 101  --  104  CO2 23  --  26  GLUCOSE 153*  --  101*  BUN 18  --  13  CREATININE 1.36* 1.29* 0.97  CALCIUM 9.2  --  8.7*  MG  --   --  1.6*   GFR: Estimated Creatinine Clearance: 49.6 mL/min (by C-G formula based on SCr of 0.97 mg/dL). Liver Function Tests: Recent Labs  Lab 08/21/20 1820  AST 23  ALT 12  ALKPHOS 43  BILITOT 0.5  PROT 7.1  ALBUMIN 3.3*   No results for input(s): LIPASE, AMYLASE in the last 168 hours. No results for input(s): AMMONIA in the last 168 hours. Coagulation Profile: No results for input(s): INR, PROTIME in the last 168 hours. Cardiac Enzymes: No results for input(s): CKTOTAL, CKMB, CKMBINDEX, TROPONINI in the last 168 hours. BNP (last 3 results) No results for input(s): PROBNP in the last 8760 hours. HbA1C: No results for input(s): HGBA1C in the last 72 hours. CBG: Recent Labs  Lab 08/22/20 1706 08/22/20 2050 08/23/20 0753 08/23/20 1131 08/23/20 1610  GLUCAP 95 157* 116* 153* 124*   Lipid Profile: No results for input(s): CHOL, HDL, LDLCALC, TRIG, CHOLHDL, LDLDIRECT in the last 72 hours. Thyroid Function Tests: Recent Labs    08/22/20 0324  TSH 2.810   Anemia Panel: No results for input(s): VITAMINB12, FOLATE, FERRITIN, TIBC, IRON, RETICCTPCT in the last 72 hours. Sepsis Labs: No results for input(s): PROCALCITON, LATICACIDVEN in the last 168 hours.  Recent Results (from the past 240 hour(s))  Urine Culture     Status: Abnormal (Preliminary result)   Collection  Time: 08/22/20  1:47 AM   Specimen: Urine, Catheterized  Result Value Ref Range Status   Specimen Description   Final    URINE, CATHETERIZED Performed at [facility], [address], [place], [place] [address]    Special Requests   Final    NONE Performed at [facility], [address] [place], [place] [address]    Culture >=100,000 COLONIES/mL ESCHERICHIA COLI (A)  Final   Report Status PENDING  Incomplete  Resp Panel by RT-PCR (Flu A&B, Covid) Nasopharyngeal Swab     Status: None   Collection Time: 08/22/20  2:50 AM   Specimen: Nasopharyngeal Swab; Nasopharyngeal(NP) swabs in vial transport medium  Result Value Ref Range Status   SARS Coronavirus 2 by RT PCR NEGATIVE NEGATIVE Final    Comment: (NOTE) SARS-CoV-2 target nucleic acids are NOT DETECTED.  The SARS-CoV-2 RNA  is generally detectable in upper respiratory specimens during the acute phase of infection. The lowest concentration of SARS-CoV-2 viral copies this assay can detect is 138 copies/mL. A negative result does not preclude SARS-Cov-2 infection and should not be used as the sole basis for treatment or other patient management decisions. A negative result may occur with  improper specimen collection/handling, submission of specimen other than nasopharyngeal swab, presence of viral mutation(s) within the areas targeted by this assay, and inadequate number of viral copies(<138 copies/mL). A negative result must be combined with clinical observations, patient history, and epidemiological information. The expected result is Negative.  Fact Sheet for Patients:  EntrepreneurPulse.com.au  Fact Sheet for Healthcare Providers:  IncredibleEmployment.be  This test is no t yet approved or cleared by the [place] FDA and  has been authorized for detection and/or diagnosis of SARS-CoV-2 by FDA under an Emergency Use Authorization (EUA). This EUA will remain  in  effect (meaning this test can be used) for the duration of the COVID-19 declaration under Section 564(b)(1) of the Act, 21 U.S.C.section 360bbb-3(b)(1), unless the authorization is terminated  or revoked sooner.       Influenza A by PCR NEGATIVE NEGATIVE Final   Influenza B by PCR NEGATIVE NEGATIVE Final    Comment: (NOTE) The Xpert Xpress SARS-CoV-2/FLU/RSV plus assay is intended as an aid in the diagnosis of influenza from Nasopharyngeal swab specimens and should not be used as a sole basis for treatment. Nasal washings and aspirates are unacceptable for Xpert Xpress SARS-CoV-2/FLU/RSV testing.  Fact Sheet for Patients: EntrepreneurPulse.com.au  Fact Sheet for Healthcare Providers: IncredibleEmployment.be  This test is not yet approved or cleared by the [place] FDA and has been authorized for detection and/or diagnosis of SARS-CoV-2 by FDA under an Emergency Use Authorization (EUA). This EUA will remain in effect (meaning this test can be used) for the duration of the COVID-19 declaration under Section 564(b)(1) of the Act, 21 U.S.C. section 360bbb-3(b)(1), unless the authorization is terminated or revoked.  Performed at [facility], [address] [place], [place] [address]      Radiology Studies: DG Chest 2 View  Result Date: 08/21/2020 CLINICAL DATA:  Shortness of breath EXAM: CHEST - 2 VIEW COMPARISON:  03/01/2020 FINDINGS: Scarring at the left base. No confluent airspace opacities otherwise. Tortuosity of the aorta with calcifications. Heart is normal size. No effusions or acute bony abnormality. IMPRESSION: No active cardiopulmonary disease. Electronically Signed   By: Rolm Baptise M.D.   On: 08/21/2020 18:40    Scheduled Meds: . clonazePAM  1 mg Oral BID  . divalproex  1,000 mg Oral QHS  . enoxaparin (LOVENOX) injection  40 mg Subcutaneous Q24H  . FLUoxetine  60 mg Oral Daily  . insulin aspart  0-15 Units  Subcutaneous TID WC  . insulin aspart  0-5 Units Subcutaneous QHS  . insulin detemir  12 Units Subcutaneous Daily  . levothyroxine  88 mcg Oral Q0600  . loratadine  10 mg Oral Daily  . lurasidone  20 mg Oral Daily  . rosuvastatin  40 mg Oral Daily   Continuous Infusions: . sodium chloride 100 mL/hr at 08/23/20 1735  . cefTRIAXone (ROCEPHIN)  IV 1 g (08/23/20 0211)     LOS: 1 day   Time spent: 35 minutes. More than 50% of the time was spent in counseling/coordination of care  Lorella Nimrod, MD [facility]  If 7PM-7AM, please contact night-coverage [email or web address]  08/23/2020, 6:05 PM   This record has been  created using Systems analyst. Errors have been sought and corrected,but may not always be located. Such creation errors do not reflect on the standard of care.

## 2020-08-23 NOTE — Evaluation (Addendum)
Occupational Therapy Evaluation Patient Details Name: [name] MRN: [id number] DOB: 11-22-1945 Today's Date: 08/23/2020    History of Present Illness [name] is a 22yoF  who was brought into the emergency room following a fall at home in which she slid off the couch as she was trying to adjust to sitting position and was too weak to pick herself up. PMH: HTN, DM, hypothyroidism, bipolar 1 disorder, recently hospitalized from 3/7-3/16 for DKA and discharged to rehab.   Clinical Impression   Pt seen for OT evaluation this date in setting of acute hospitalization d/t fall. Pt reports that before her initial fall and recent hospitalization, that she was MOD I with fxl mobility and mostly INDEP with basic self care, but had aide intermittently (no set schedule) for IADL assistance. Pt reports that the aide has been inconsistent and pt is concerned she will require more consistent help after rehab this time. Currently, pt requires MOD A For bed mobility, MIN A for seated LB ADLs and MOD A for transfers with AD for UE support. Pt with decreased fxl activity tolerance and general deconditioning that are currently impacting her ability to safely and efficiently perform self care. Will continue to follow acutely and recommend that pt f/u with STR services upon d/c from acute setting. Of note: pt is interested in looking into assisted living as an option for after completing rehabilitation.     Follow Up Recommendations  SNF    Equipment Recommendations  3 in 1 bedside commode;Tub/shower seat;Other (comment) (2ww)    Recommendations for Other Services       Precautions / Restrictions Precautions Precautions: Fall Restrictions Weight Bearing Restrictions: No      Mobility Bed Mobility Overal bed mobility: Needs Assistance Bed Mobility: Supine to Sit;Sit to Supine     Supine to sit: Min assist;Mod assist Sit to supine: Mod assist   General bed mobility comments: MOD A to  manage LEs for back to bed    Transfers                 General transfer comment: declines to stand at this time citing having stood with PT just prior    Balance Overall balance assessment: Needs assistance Sitting-balance support: Feet supported;Bilateral upper extremity supported Sitting balance-Leahy Scale: Fair Sitting balance - Comments: F static sitting once assisted to square hips       Standing balance comment: NT this date                           ADL either performed or assessed with clinical judgement   ADL Overall ADL's : Needs assistance/impaired                                       General ADL Comments: SETUP for seated UB ADLs, MIN A for seated LB ADLs d/t more dynamic nature of task. MOD A for standing per PT note so anticipate at least MOD A for standing LB ADLs including clothing mgt over hips, based on clinical observation.     Vision Baseline Vision/History: Wears glasses Wears Glasses: At all times Patient Visual Report: No change from baseline       Perception     Praxis      Pertinent Vitals/Pain Pain Assessment: 0-10 Pain Score: 6  Pain Location: LBP Pain Descriptors / Indicators: Aching;Sore Pain  Intervention(s): Limited activity within patient's tolerance;Monitored during session;Premedicated before session;Repositioned     Hand Dominance Right   Extremity/Trunk Assessment Upper Extremity Assessment Upper Extremity Assessment: Generalized weakness;Overall WFL for tasks assessed (ROM WFL, MMT grossly 4-/5)   Lower Extremity Assessment Lower Extremity Assessment: Generalized weakness;Defer to PT evaluation (ROM is functional for LB ADLs in sitting, but pt overall with decreased strength impacting standing tolerance)       Communication Communication Communication: No difficulties   Cognition Arousal/Alertness: Awake/alert Behavior During Therapy: WFL for tasks assessed/performed Overall Cognitive  Status: Within Functional Limits for tasks assessed                                 General Comments: Pt is oriented to self, place, and some aspects of situation (knows she has been falling and that is why she has had to go back and forth to rehab and back to the hospital). She is not oriented to time without cues.   General Comments       Exercises Other Exercises Other Exercises: OT facilitates ed re: role of OT in acute setting, importance of OOB activity and importance of trunk strength as it pertains to fall prevention with dynamic ADL movement. Pt with good understanding. Other Exercises: OT engages pt in one set x10 reps postural extension with scap retraction for 2-3 sec hold to improve extensor strength as well as 1 set x10 reps modified seated sit ups to improve core strength for practical mobility such as reaching to retrieve ADL items. Pt tolerates well.   Shoulder Instructions      Home Living Family/patient expects to be discharged to:: Private residence Living Arrangements: Alone Available Help at Discharge: Other (Comment) (previously had a PCA, but states that she is not on a regular schedule and was barely working out before recent falls/rehab stay.) Type of Home: Apartment Home Access: Stairs to enter [employer] of Steps: 1 small step to access apartment area Entrance Stairs-Rails: None Home Layout: One level     Bathroom Shower/Tub: Tub/shower unit         Home Equipment: Clinical cytogeneticist - 4 wheels;Walker - 2 wheels   Additional Comments: Pt says she is living in an apartment for seniors now; Was only home x1d s/p DC from [facility] facility.      Prior Functioning/Environment Level of Independence: Needs assistance  Gait / Transfers Assistance Needed: Pt reports that not long ago she was able to walk around the grocery store, etc - now struggles to do more than limited in home distances and has had repeated falls ADL's / Homemaking  Assistance Needed: does her own cooking, has an aide that takes her to store or orders [employer] gro[employer]reies; takes van to appointments            OT Problem List: Decreased strength;Decreased range of motion;Decreased activity tolerance;Impaired balance (sitting and/or standing);Decreased cognition;Decreased knowledge of use of DME or AE      OT Treatment/Interventions: Self-care/ADL training;DME and/or AE instruction;Therapeutic activities;Balance training;Therapeutic exercise;Energy conservation;Patient/family education    OT Goals(Current goals can be found in the care plan section) Acute Rehab OT Goals Patient Stated Goal: rehab then home; "...and eventually I need to look at going to assisted living" OT Goal Formulation: With patient Time For Goal Achievement: 09/06/20 Potential to Achieve Goals: Good ADL Goals Pt Will Perform Lower Body Bathing: with min guard assist;sit to/from stand (with AE and LRAD as  needed.) Pt Will Perform Lower Body Dressing: with min guard assist;sit to/from stand (with LRAD and AE PRN) Pt Will Transfer to Toilet: with min guard assist;with min assist;bedside commode;grab bars (with LRAD to/from restroom) Pt Will Perform Toileting - Clothing Manipulation and hygiene: with min guard assist;with min assist;sit to/from stand (with grab bars) Pt/caregiver will Perform Home Exercise Program: Increased strength;Both right and left upper extremity;With minimal assist (MIN visual/verbal cues for form/pace/technique.)  OT Frequency: Min 2X/week   Barriers to D/C:            Co-evaluation              AM-PAC OT "6 Clicks" Daily Activity     Outcome Measure Help from another person eating meals?: None Help from another person taking care of personal grooming?: A Little Help from another person toileting, which includes using toliet, bedpan, or urinal?: A Lot Help from another person bathing (including washing, rinsing, drying)?: A Lot Help from another  person to put on and taking off regular upper body clothing?: A Little Help from another person to put on and taking off regular lower body clothing?: A Lot 6 Click Score: 16   End of Session Nurse Communication: Mobility status  Activity Tolerance: Patient tolerated treatment well Patient left: in bed;with call bell/phone within reach;with bed alarm set  OT Visit Diagnosis: Unsteadiness on feet (R26.81);Muscle weakness (generalized) (M62.81);History of falling (Z91.81)                Time: 6759-1638 OT Time Calculation (min): 34 min Charges:  OT General Charges $OT Visit: 1 Visit OT Evaluation $OT Eval Moderate Complexity: 1 Mod OT Treatments $Self Care/Home Management : 8-22 mins $Therapeutic Activity: 8-22 mins  Gerrianne Scale, MS, OTR/L ascom [phone] 08/23/20, 2:46 PM

## 2020-08-24 DIAGNOSIS — E785 Hyperlipidemia, unspecified: Secondary | ICD-10-CM | POA: Diagnosis not present

## 2020-08-24 DIAGNOSIS — M6281 Muscle weakness (generalized): Secondary | ICD-10-CM | POA: Diagnosis not present

## 2020-08-24 DIAGNOSIS — R404 Transient alteration of awareness: Secondary | ICD-10-CM | POA: Diagnosis not present

## 2020-08-24 DIAGNOSIS — I959 Hypotension, unspecified: Secondary | ICD-10-CM | POA: Diagnosis not present

## 2020-08-24 DIAGNOSIS — N183 Chronic kidney disease, stage 3 unspecified: Secondary | ICD-10-CM | POA: Diagnosis not present

## 2020-08-24 DIAGNOSIS — N179 Acute kidney failure, unspecified: Secondary | ICD-10-CM | POA: Diagnosis not present

## 2020-08-24 DIAGNOSIS — Z1159 Encounter for screening for other viral diseases: Secondary | ICD-10-CM | POA: Diagnosis not present

## 2020-08-24 DIAGNOSIS — Z9181 History of falling: Secondary | ICD-10-CM | POA: Diagnosis not present

## 2020-08-24 DIAGNOSIS — E119 Type 2 diabetes mellitus without complications: Secondary | ICD-10-CM | POA: Diagnosis not present

## 2020-08-24 DIAGNOSIS — J302 Other seasonal allergic rhinitis: Secondary | ICD-10-CM | POA: Diagnosis not present

## 2020-08-24 DIAGNOSIS — I10 Essential (primary) hypertension: Secondary | ICD-10-CM | POA: Diagnosis not present

## 2020-08-24 DIAGNOSIS — R42 Dizziness and giddiness: Secondary | ICD-10-CM | POA: Diagnosis not present

## 2020-08-24 DIAGNOSIS — Z743 Need for continuous supervision: Secondary | ICD-10-CM | POA: Diagnosis not present

## 2020-08-24 DIAGNOSIS — R279 Unspecified lack of coordination: Secondary | ICD-10-CM | POA: Diagnosis not present

## 2020-08-24 DIAGNOSIS — N390 Urinary tract infection, site not specified: Secondary | ICD-10-CM | POA: Diagnosis not present

## 2020-08-24 DIAGNOSIS — E039 Hypothyroidism, unspecified: Secondary | ICD-10-CM | POA: Diagnosis not present

## 2020-08-24 DIAGNOSIS — R0902 Hypoxemia: Secondary | ICD-10-CM | POA: Diagnosis not present

## 2020-08-24 DIAGNOSIS — R531 Weakness: Secondary | ICD-10-CM | POA: Diagnosis not present

## 2020-08-24 LAB — RESP PANEL BY RT-PCR (FLU A&B, COVID) ARPGX2
Influenza A by PCR: NEGATIVE
Influenza B by PCR: NEGATIVE
SARS Coronavirus 2 by RT PCR: NEGATIVE

## 2020-08-24 LAB — GLUCOSE, CAPILLARY: Glucose-Capillary: 123 mg/dL — ABNORMAL HIGH (ref 70–99)

## 2020-08-24 LAB — URINE CULTURE: Culture: >=100000 — AB

## 2020-08-24 LAB — SARS CORONAVIRUS 2 (TAT 6-24 HRS): SARS Coronavirus 2: NEGATIVE

## 2020-08-24 LAB — BASIC METABOLIC PANEL
Anion gap: 6 (ref 5–15)
BUN: 10 mg/dL (ref 8–23)
CO2: 25 mmol/L (ref 22–32)
Calcium: 8.5 mg/dL — ABNORMAL LOW (ref 8.9–10.3)
Chloride: 107 mmol/L (ref 98–111)
Creatinine, Ser: 1.03 mg/dL — ABNORMAL HIGH (ref 0.44–1.00)
GFR, Estimated: 57 mL/min — ABNORMAL LOW (ref >60)
Glucose, Bld: 132 mg/dL — ABNORMAL HIGH (ref 70–99)
Potassium: 3.8 mmol/L (ref 3.5–5.1)
Sodium: 138 mmol/L (ref 135–145)

## 2020-08-24 LAB — MAGNESIUM: Magnesium: 2 mg/dL (ref 1.7–2.4)

## 2020-08-24 MED ORDER — CLONAZEPAM 1 MG PO TABS: 1.0000 mg | ORAL_TABLET | Freq: Two times a day (BID) | ORAL | 1 refills | Status: DC

## 2020-08-24 MED ORDER — CEPHALEXIN 500 MG PO CAPS: 500.0000 mg | ORAL_CAPSULE | Freq: Three times a day (TID) | ORAL | 0 refills | Status: AC

## 2020-08-24 NOTE — TOC Transition Note (Signed)
Transition of Care [facility]) - CM/SW Discharge Note   Patient Details  Name: [name] MRN: [id number] Date of Birth: 03-27-1946  [facility] CM/SW Contact:  Candie Chroman, LCSW Phone Number: 08/24/2020, 10:35 AM   Clinical Narrative:   Patient has orders to discharge to [facility] today. RN will call report to [phone] (Room 23A). EMS transport has been set up for 11:30 am. Bonne Dolores calling her caregiver to provide update but voicemail is full. No further concerns. CSW signing off.  Final next level of care: Skilled Nursing Facility Barriers to Discharge: Barriers Resolved   Patient Goals and CMS Choice     Choice offered to / list presented to : Patient  Discharge Placement   Existing PASRR number confirmed : 08/22/20          Patient chooses bed at: [facility] Patient to be transferred to facility by: EMS   Patient and family notified of of transfer: 08/24/20  Discharge Plan and Services     Post Acute Care Choice: [place]                               Social Determinants of Health (SDOH) Interventions     Readmission Risk Interventions No flowsheet data found.

## 2020-08-24 NOTE — Progress Notes (Signed)
Per [facility] lab COVID test is pending with [facility]. Could take as long as 48 hours to result. Will need to repeat PCR if needing for discharge.   Fuller Mandril, RN

## 2020-08-24 NOTE — Discharge Summary (Signed)
Physician Discharge Summary  [name] KZS:[id number] DOB: 1946-05-12 DOA: 08/21/2020  PCP: Mechele Claude, FNP  Admit date: 08/21/2020 Discharge date: 08/24/2020  Admitted From: Home Disposition: SNF   Recommendations for Outpatient Follow-up:  1. Follow up with PCP in 1-2 weeks 2. Please obtain BMP/CBC in one week 3. Please follow up on the following pending results: None  4. Please keep her well-hydrated  Home Health:No Equipment/Devices: Rolling walker Discharge Condition: Stable CODE STATUS: Full Diet recommendation: Heart Healthy / Carb Modified   Brief/Interim Summary: 75 year old female who lives alone, history of HTN, DM 2, hypothyroidism, bipolar 1 disorder, who comes into the hospital with generalized weakness and a fall at home, unable to get up on her own. She was recently hospitalized about a month ago for DKA, she was discharged to rehab for a few days, then got moved to another rehab for a few days and eventually discharged home. She was home for 1 day, became too weak to get up and came back to the hospital. She denies any fever or chills but does complain of significant dysuria at home. Urine culture grew E. coli with good sensitivity, patient received ceftriaxone while in the hospital and discharged on Keflex for 3 more days.  Patient also had some electrolyte abnormalities including hypokalemia and hypomagnesemia along with AKI which was resolved with repletion of fluid and electrolytes.  Patient's home dose of enalapril was held during hospitalization due to softer blood pressure on admission.  She can resume on discharge as blood pressure trending up.  She needs to keep herself well-hydrated.  Patient has an history of bipolar disorder and on multiple psych meds which were continued during current hospitalization and on discharge.  Patient was on multiple oral and insulin for her diabetes.  CBG remained within goal.  Her home dose of glipizide was  discontinued and she will continue with insulin along with Tradjenta and Metformin and follow-up with her primary care provider for further management.  Patient lives alone, not in contact with her kids who lives out of state, might need long-term care.  She will continue rest of her medications and follow-up with her providers.  Discharge Diagnoses:  Principal Problem:   Generalized weakness Active Problems:   Essential hypertension   Hypothyroidism   Type 2 diabetes mellitus without complication (HCC)   Chronic kidney disease, stage 3 unspecified (HCC)   UTI (urinary tract infection)   Unable to care for self   Bipolar 1 disorder ([place])   Weakness   AKI (acute kidney injury) [facility])   Discharge Instructions  Discharge Instructions    Diet - low sodium heart healthy   Complete by: As directed    Increase activity slowly   Complete by: As directed      Allergies as of 08/24/2020      Reactions   Navane [thiothixene] Other (See Comments)   Reaction:  Unknown   Penicillins Rash   Has patient had a PCN reaction causing immediate rash, facial/tongue/throat swelling, SOB or lightheadedness with hypotension: No Has patient had a PCN reaction causing severe rash involving mucus membranes or skin necrosis: No Has patient had a PCN reaction that required hospitalization: No Has patient had a PCN reaction occurring within the last 10 years: No If all of the above answers are "NO", then may proceed with Cephalosporin use. Has patient had a PCN reaction causing immediate rash, facial/tongue/throat swelling, SOB or lightheadedness with hypotension: No Has patient had a PCN reaction causing severe  rash involving mucus membranes or skin necrosis: No Has patient had a PCN reaction that required hospitalization: No Has patient had a PCN reaction occurring within the last 10 years: No If all of the above answers are "NO", then may proceed with Cephalosporin use. Has patient had a PCN reaction  causing immediate rash, facial/tongue/throat swelling, SOB or lightheadedness with hypotension: No Has patient had a PCN reaction causing severe rash involving mucus membranes or skin necrosis: No Has patient had a PCN reaction that required hospitalization: No Has patient had a PCN reaction occurring within the last 10 years: No If all of the above answers are "NO", then may proceed with Cephalosporin use.      Medication List    STOP taking these medications   glipiZIDE 10 MG tablet Commonly known as: GLUCOTROL     TAKE these medications   cephALEXin 500 MG capsule Commonly known as: KEFLEX Take 1 capsule (500 mg total) by mouth 3 (three) times daily for 3 days.   clonazePAM 1 MG tablet Commonly known as: KLONOPIN Take 1 tablet (1 mg total) by mouth 2 (two) times daily.   divalproex 500 MG 24 hr tablet Commonly known as: DEPAKOTE ER Take 2 tablets (1,000 mg total) by mouth at bedtime.   enalapril 5 MG tablet Commonly known as: VASOTEC Take 1 tablet (5 mg total) by mouth 2 (two) times daily.   FLUoxetine 20 MG capsule Commonly known as: PROZAC Take 60 mg by mouth daily.   insulin detemir 100 UNIT/ML injection Commonly known as: LEVEMIR Inject 0.15 mLs (15 Units total) into the skin daily.   Latuda 20 MG Tabs tablet Generic drug: lurasidone Take 20 mg by mouth daily. What changed: Another medication with the same name was removed. Continue taking this medication, and follow the directions you see here.   levothyroxine 88 MCG tablet Commonly known as: SYNTHROID Take 88 mcg by mouth daily before breakfast.   linagliptin 5 MG Tabs tablet Commonly known as: TRADJENTA Take 5 mg by mouth daily.   loratadine 10 MG tablet Commonly known as: CLARITIN Take 1 tablet (10 mg total) by mouth daily.   metFORMIN 500 MG tablet Commonly known as: GLUCOPHAGE Take 2 tablets (1,000 mg total) by mouth 2 (two) times daily with a meal.   ondansetron 4 MG disintegrating  tablet Commonly known as: ZOFRAN-ODT Take 4 mg by mouth every 6 (six) hours as needed for nausea.   rosuvastatin 40 MG tablet Commonly known as: CRESTOR Take 1 tablet (40 mg total) by mouth daily.       Contact information for follow-up providers    Mechele Claude, FNP.   Specialty: Family Medicine Why: As needed Contact information: [place] [address] [phone]            Contact information for after-discharge care    [facility] .   Service: Skilled Nursing Contact information: [place] [phone]                 Allergies  Allergen Reactions  . Navane [Thiothixene] Other (See Comments)    Reaction:  Unknown  . Penicillins Rash    Has patient had a PCN reaction causing immediate rash, facial/tongue/throat swelling, SOB or lightheadedness with hypotension: No Has patient had a PCN reaction causing severe rash involving mucus membranes or skin necrosis: No Has patient had a PCN reaction that required hospitalization: No Has patient  had a PCN reaction occurring within the last 10 years: No If all of the above answers are "NO", then may proceed with Cephalosporin use.  Has patient had a PCN reaction causing immediate rash, facial/tongue/throat swelling, SOB or lightheadedness with hypotension: No Has patient had a PCN reaction causing severe rash involving mucus membranes or skin necrosis: No Has patient had a PCN reaction that required hospitalization: No Has patient had a PCN reaction occurring within the last 10 years: No If all of the above answers are "NO", then may proceed with Cephalosporin use. Has patient had a PCN reaction causing immediate rash, facial/tongue/throat swelling, SOB or lightheadedness with hypotension: No Has patient had a PCN reaction causing severe rash involving mucus membranes or skin necrosis: No Has patient had a PCN reaction  that required hospitalization: No Has patient had a PCN reaction occurring within the last 10 years: No If all of the above answers are "NO", then may proceed with Cephalosporin use.    Consultations:  None  Procedures/Studies: DG Chest 2 View  Result Date: 08/21/2020 CLINICAL DATA:  Shortness of breath EXAM: CHEST - 2 VIEW COMPARISON:  03/01/2020 FINDINGS: Scarring at the left base. No confluent airspace opacities otherwise. Tortuosity of the aorta with calcifications. Heart is normal size. No effusions or acute bony abnormality. IMPRESSION: No active cardiopulmonary disease. Electronically Signed   By: Rolm Baptise M.D.   On: 08/21/2020 18:40    Subjective: Patient was seen and examined today.  No complaint.  Eating and drinking okay.  Discharge Exam: Vitals:   08/24/20 0513 08/24/20 0804  BP: 135/84 116/85  Pulse: 69 68  Resp: 18 16  Temp: (!) 97.5 F (36.4 C) 97.8 F (36.6 C)  SpO2: 93% 93%   Vitals:   08/23/20 1502 08/23/20 2040 08/24/20 0513 08/24/20 0804  BP: 103/72 124/69 135/84 116/85  Pulse: 73 67 69 68  Resp:  16 18 16   Temp: (!) 97.3 F (36.3 C) 98.2 F (36.8 C) (!) 97.5 F (36.4 C) 97.8 F (36.6 C)  TempSrc: Oral Oral Oral Oral  SpO2: 96% 92% 93% 93%  Weight:      Height:        General: Pt is alert, awake, not in acute distress Cardiovascular: RRR, S1/S2 +, no rubs, no gallops Respiratory: CTA bilaterally, no wheezing, no rhonchi Abdominal: Soft, NT, ND, bowel sounds + Extremities: no edema, no cyanosis   The results of significant diagnostics from this hospitalization (including imaging, microbiology, ancillary and laboratory) are listed below for reference.    Microbiology: Recent Results (from the past 240 hour(s))  Urine Culture     Status: Abnormal   Collection Time: 08/22/20  1:47 AM   Specimen: Urine, Catheterized  Result Value Ref Range Status   Specimen Description   Final    URINE, CATHETERIZED Performed at [facility],  [address], [place], [place] [address]    Special Requests   Final    NONE Performed at [facility], [address] [place], [place] [address]    Culture >=100,000 COLONIES/mL ESCHERICHIA COLI (A)  Final   Report Status 08/24/2020 FINAL  Final   Organism ID, Bacteria ESCHERICHIA COLI (A)  Final      Susceptibility   Escherichia coli - MIC*    AMPICILLIN <=2 SENSITIVE Sensitive     CEFAZOLIN <=4 SENSITIVE Sensitive     CEFEPIME <=0.12 SENSITIVE Sensitive     CEFTRIAXONE <=0.25 SENSITIVE Sensitive     CIPROFLOXACIN <=0.25 SENSITIVE  Sensitive     GENTAMICIN <=1 SENSITIVE Sensitive     IMIPENEM <=0.25 SENSITIVE Sensitive     NITROFURANTOIN <=16 SENSITIVE Sensitive     TRIMETH/SULFA <=20 SENSITIVE Sensitive     AMPICILLIN/SULBACTAM <=2 SENSITIVE Sensitive     PIP/TAZO <=4 SENSITIVE Sensitive     * >=100,000 COLONIES/mL ESCHERICHIA COLI  Resp Panel by RT-PCR (Flu A&B, Covid) Nasopharyngeal Swab     Status: None   Collection Time: 08/22/20  2:50 AM   Specimen: Nasopharyngeal Swab; Nasopharyngeal(NP) swabs in vial transport medium  Result Value Ref Range Status   SARS Coronavirus 2 by RT PCR NEGATIVE NEGATIVE Final    Comment: (NOTE) SARS-CoV-2 target nucleic acids are NOT DETECTED.  The SARS-CoV-2 RNA is generally detectable in upper respiratory specimens during the acute phase of infection. The lowest concentration of SARS-CoV-2 viral copies this assay can detect is 138 copies/mL. A negative result does not preclude SARS-Cov-2 infection and should not be used as the sole basis for treatment or other patient management decisions. A negative result may occur with  improper specimen collection/handling, submission of specimen other than nasopharyngeal swab, presence of viral mutation(s) within the areas targeted by this assay, and inadequate number of viral copies(<138 copies/mL). A negative result must be combined with clinical observations, patient history, and  epidemiological information. The expected result is Negative.  Fact Sheet for Patients:  EntrepreneurPulse.com.au  Fact Sheet for Healthcare Providers:  IncredibleEmployment.be  This test is no t yet approved or cleared by the [place] FDA and  has been authorized for detection and/or diagnosis of SARS-CoV-2 by FDA under an Emergency Use Authorization (EUA). This EUA will remain  in effect (meaning this test can be used) for the duration of the COVID-19 declaration under Section 564(b)(1) of the Act, 21 U.S.C.section 360bbb-3(b)(1), unless the authorization is terminated  or revoked sooner.       Influenza A by PCR NEGATIVE NEGATIVE Final   Influenza B by PCR NEGATIVE NEGATIVE Final    Comment: (NOTE) The Xpert Xpress SARS-CoV-2/FLU/RSV plus assay is intended as an aid in the diagnosis of influenza from Nasopharyngeal swab specimens and should not be used as a sole basis for treatment. Nasal washings and aspirates are unacceptable for Xpert Xpress SARS-CoV-2/FLU/RSV testing.  Fact Sheet for Patients: EntrepreneurPulse.com.au  Fact Sheet for Healthcare Providers: IncredibleEmployment.be  This test is not yet approved or cleared by the [place] FDA and has been authorized for detection and/or diagnosis of SARS-CoV-2 by FDA under an Emergency Use Authorization (EUA). This EUA will remain in effect (meaning this test can be used) for the duration of the COVID-19 declaration under Section 564(b)(1) of the Act, 21 U.S.C. section 360bbb-3(b)(1), unless the authorization is terminated or revoked.  Performed at [facility], [address] [place], [place] [address]      Labs: BNP (last 3 results) Recent Labs    03/01/20 1312  BNP 96.7   Basic Metabolic Panel: Recent Labs  Lab 08/21/20 1820 08/22/20 0324 08/23/20 0417 08/24/20 0743  NA 137  --  140 138  K 3.3*  --  3.1* 3.8   CL 101  --  104 107  CO2 23  --  26 25  GLUCOSE 153*  --  101* 132*  BUN 18  --  13 10  CREATININE 1.36* 1.29* 0.97 1.03*  CALCIUM 9.2  --  8.7* 8.5*  MG  --   --  1.6* 2.0   Liver Function Tests: Recent Labs  Lab 08/21/20 1820  AST 23  ALT 12  ALKPHOS 43  BILITOT 0.5  PROT 7.1  ALBUMIN 3.3*   No results for input(s): LIPASE, AMYLASE in the last 168 hours. No results for input(s): AMMONIA in the last 168 hours. CBC: Recent Labs  Lab 08/21/20 1820 08/23/20 0417  WBC 9.3 7.3  HGB 15.1* 14.2  HCT 45.9 42.1  MCV 88.1 87.5  PLT 215 192   Cardiac Enzymes: No results for input(s): CKTOTAL, CKMB, CKMBINDEX, TROPONINI in the last 168 hours. BNP: Invalid input(s): POCBNP CBG: Recent Labs  Lab 08/23/20 0753 08/23/20 1131 08/23/20 1610 08/23/20 2150 08/24/20 0759  GLUCAP 116* 153* 124* 94 123*   D-Dimer No results for input(s): DDIMER in the last 72 hours. Hgb A1c No results for input(s): HGBA1C in the last 72 hours. Lipid Profile No results for input(s): CHOL, HDL, LDLCALC, TRIG, CHOLHDL, LDLDIRECT in the last 72 hours. Thyroid function studies Recent Labs    08/22/20 0324  TSH 2.810   Anemia work up No results for input(s): VITAMINB12, FOLATE, FERRITIN, TIBC, IRON, RETICCTPCT in the last 72 hours. Urinalysis    Component Value Date/Time   COLORURINE YELLOW (A) 08/22/2020 0147   APPEARANCEUR CLOUDY (A) 08/22/2020 0147   APPEARANCEUR Clear 02/24/2017 1050   LABSPEC 1.016 08/22/2020 0147   LABSPEC 1.018 02/25/2014 0927   PHURINE 6.0 08/22/2020 0147   GLUCOSEU NEGATIVE 08/22/2020 0147   GLUCOSEU Negative 02/25/2014 0927   HGBUR MODERATE (A) 08/22/2020 0147   BILIRUBINUR NEGATIVE 08/22/2020 0147   BILIRUBINUR Negative 02/24/2017 1050   BILIRUBINUR Negative 02/25/2014 0927   KETONESUR 5 (A) 08/22/2020 0147   PROTEINUR 100 (A) 08/22/2020 0147   NITRITE NEGATIVE 08/22/2020 0147   LEUKOCYTESUR LARGE (A) 08/22/2020 0147   LEUKOCYTESUR 3+ 02/25/2014 0927    Sepsis Labs Invalid input(s): PROCALCITONIN,  WBC,  LACTICIDVEN Microbiology Recent Results (from the past 240 hour(s))  Urine Culture     Status: Abnormal   Collection Time: 08/22/20  1:47 AM   Specimen: Urine, Catheterized  Result Value Ref Range Status   Specimen Description   Final    URINE, CATHETERIZED Performed at [facility], [address] [place], [place] [address]    Special Requests   Final    NONE Performed at [facility], [address] [place], [place] [address]    Culture >=100,000 COLONIES/mL ESCHERICHIA COLI (A)  Final   Report Status 08/24/2020 FINAL  Final   Organism ID, Bacteria ESCHERICHIA COLI (A)  Final      Susceptibility   Escherichia coli - MIC*    AMPICILLIN <=2 SENSITIVE Sensitive     CEFAZOLIN <=4 SENSITIVE Sensitive     CEFEPIME <=0.12 SENSITIVE Sensitive     CEFTRIAXONE <=0.25 SENSITIVE Sensitive     CIPROFLOXACIN <=0.25 SENSITIVE Sensitive     GENTAMICIN <=1 SENSITIVE Sensitive     IMIPENEM <=0.25 SENSITIVE Sensitive     NITROFURANTOIN <=16 SENSITIVE Sensitive     TRIMETH/SULFA <=20 SENSITIVE Sensitive     AMPICILLIN/SULBACTAM <=2 SENSITIVE Sensitive     PIP/TAZO <=4 SENSITIVE Sensitive     * >=100,000 COLONIES/mL ESCHERICHIA COLI  Resp Panel by RT-PCR (Flu A&B, Covid) Nasopharyngeal Swab     Status: None   Collection Time: 08/22/20  2:50 AM   Specimen: Nasopharyngeal Swab; Nasopharyngeal(NP) swabs in vial transport medium  Result Value Ref Range Status   SARS Coronavirus 2 by RT PCR NEGATIVE NEGATIVE Final    Comment: (NOTE) SARS-CoV-2 target nucleic acids  are NOT DETECTED.  The SARS-CoV-2 RNA is generally detectable in upper respiratory specimens during the acute phase of infection. The lowest concentration of SARS-CoV-2 viral copies this assay can detect is 138 copies/mL. A negative result does not preclude SARS-Cov-2 infection and should not be used as the sole basis for treatment or other patient  management decisions. A negative result may occur with  improper specimen collection/handling, submission of specimen other than nasopharyngeal swab, presence of viral mutation(s) within the areas targeted by this assay, and inadequate number of viral copies(<138 copies/mL). A negative result must be combined with clinical observations, patient history, and epidemiological information. The expected result is Negative.  Fact Sheet for Patients:  EntrepreneurPulse.com.au  Fact Sheet for Healthcare Providers:  IncredibleEmployment.be  This test is no t yet approved or cleared by the [place] FDA and  has been authorized for detection and/or diagnosis of SARS-CoV-2 by FDA under an Emergency Use Authorization (EUA). This EUA will remain  in effect (meaning this test can be used) for the duration of the COVID-19 declaration under Section 564(b)(1) of the Act, 21 U.S.C.section 360bbb-3(b)(1), unless the authorization is terminated  or revoked sooner.       Influenza A by PCR NEGATIVE NEGATIVE Final   Influenza B by PCR NEGATIVE NEGATIVE Final    Comment: (NOTE) The Xpert Xpress SARS-CoV-2/FLU/RSV plus assay is intended as an aid in the diagnosis of influenza from Nasopharyngeal swab specimens and should not be used as a sole basis for treatment. Nasal washings and aspirates are unacceptable for Xpert Xpress SARS-CoV-2/FLU/RSV testing.  Fact Sheet for Patients: EntrepreneurPulse.com.au  Fact Sheet for Healthcare Providers: IncredibleEmployment.be  This test is not yet approved or cleared by the [place] FDA and has been authorized for detection and/or diagnosis of SARS-CoV-2 by FDA under an Emergency Use Authorization (EUA). This EUA will remain in effect (meaning this test can be used) for the duration of the COVID-19 declaration under Section 564(b)(1) of the Act, 21 U.S.C. section 360bbb-3(b)(1),  unless the authorization is terminated or revoked.  Performed at [facility], [address] [place], [place] [address]     Time coordinating discharge: Over 30 minutes  SIGNED:  Lorella Nimrod, MD  [facility] 08/24/2020, 9:57 AM  If 7PM-7AM, please contact night-coverage [email or web address]  This record has been created using Systems analyst. Errors have been sought and corrected,but may not always be located. Such creation errors do not reflect on the standard of care.

## 2020-08-24 NOTE — TOC Progression Note (Signed)
Transition of Care [facility]) - Progression Note    Patient Details  Name: [name] MRN: [id number] Date of Birth: 03-01-1946  [facility]) CM/SW [place], LCSW Phone Number: 08/24/2020, 8:34 AM  Clinical Narrative: Josem Kaufmann approved for [facility]: [id number]. Valid 4/7-4/11. SNF admissions coordinator is aware. [place] now requiring rapid COVID tests on day of discharge. Sent secure chat to MD requesting order.   Expected Discharge Plan: [place] Barriers to Discharge: Continued Medical Work up,Insurance Authorization  Expected Discharge Plan and Services Expected Discharge Plan: [place] Choice: [place] arrangements for the past 2 months: [place]                                       Social Determinants of Health (SDOH) Interventions    Readmission Risk Interventions No flowsheet data found.

## 2020-08-24 NOTE — Progress Notes (Signed)
Sent COVID restest to lab for discharge.   Fuller Mandril, RN

## 2020-08-24 NOTE — Progress Notes (Signed)
Report given to [facility] LPN with [facility]. EMS called and patient is pending pick up. Patient AVS printed and will be given to EMS.   Fuller Mandril, RN

## 2020-08-24 NOTE — Plan of Care (Signed)
  Problem: Activity: Goal: Risk for activity intolerance will decrease Outcome: Progressing   Problem: Nutrition: Goal: Adequate nutrition will be maintained Outcome: Progressing   Problem: Safety: Goal: Ability to remain free from injury will improve Outcome: Progressing   Problem: Skin Integrity: Goal: Risk for impaired skin integrity will decrease Outcome: Progressing   

## 2020-08-24 NOTE — Progress Notes (Signed)
EMS has received patient and patient has discharged.   Fuller Mandril, RN

## 2020-08-25 DIAGNOSIS — N179 Acute kidney failure, unspecified: Secondary | ICD-10-CM | POA: Diagnosis not present

## 2020-08-25 DIAGNOSIS — E119 Type 2 diabetes mellitus without complications: Secondary | ICD-10-CM | POA: Diagnosis not present

## 2020-08-25 DIAGNOSIS — N390 Urinary tract infection, site not specified: Secondary | ICD-10-CM | POA: Diagnosis not present

## 2020-08-25 DIAGNOSIS — M6281 Muscle weakness (generalized): Secondary | ICD-10-CM | POA: Diagnosis not present

## 2020-08-25 DIAGNOSIS — Z9181 History of falling: Secondary | ICD-10-CM | POA: Diagnosis not present

## 2020-08-31 DIAGNOSIS — I10 Essential (primary) hypertension: Secondary | ICD-10-CM | POA: Diagnosis not present

## 2020-09-05 DIAGNOSIS — R42 Dizziness and giddiness: Secondary | ICD-10-CM | POA: Diagnosis not present

## 2020-09-05 DIAGNOSIS — I10 Essential (primary) hypertension: Secondary | ICD-10-CM | POA: Diagnosis not present

## 2020-09-05 DIAGNOSIS — I959 Hypotension, unspecified: Secondary | ICD-10-CM | POA: Diagnosis not present

## 2020-09-05 DIAGNOSIS — M6281 Muscle weakness (generalized): Secondary | ICD-10-CM | POA: Diagnosis not present

## 2020-09-08 DIAGNOSIS — I10 Essential (primary) hypertension: Secondary | ICD-10-CM | POA: Diagnosis not present

## 2020-09-08 DIAGNOSIS — R296 Repeated falls: Secondary | ICD-10-CM | POA: Diagnosis not present

## 2020-09-08 DIAGNOSIS — M6281 Muscle weakness (generalized): Secondary | ICD-10-CM | POA: Diagnosis not present

## 2020-09-08 DIAGNOSIS — W19XXXA Unspecified fall, initial encounter: Secondary | ICD-10-CM | POA: Diagnosis not present

## 2020-09-08 DIAGNOSIS — R031 Nonspecific low blood-pressure reading: Secondary | ICD-10-CM | POA: Diagnosis not present

## 2020-09-08 DIAGNOSIS — N390 Urinary tract infection, site not specified: Secondary | ICD-10-CM | POA: Diagnosis not present

## 2020-09-11 DIAGNOSIS — N390 Urinary tract infection, site not specified: Secondary | ICD-10-CM | POA: Diagnosis not present

## 2020-09-11 DIAGNOSIS — M6281 Muscle weakness (generalized): Secondary | ICD-10-CM | POA: Diagnosis not present

## 2020-09-12 DIAGNOSIS — M6281 Muscle weakness (generalized): Secondary | ICD-10-CM | POA: Diagnosis not present

## 2020-09-12 DIAGNOSIS — N390 Urinary tract infection, site not specified: Secondary | ICD-10-CM | POA: Diagnosis not present

## 2020-09-13 DIAGNOSIS — N390 Urinary tract infection, site not specified: Secondary | ICD-10-CM | POA: Diagnosis not present

## 2020-09-13 DIAGNOSIS — M6281 Muscle weakness (generalized): Secondary | ICD-10-CM | POA: Diagnosis not present

## 2020-09-14 DIAGNOSIS — N390 Urinary tract infection, site not specified: Secondary | ICD-10-CM | POA: Diagnosis not present

## 2020-09-14 DIAGNOSIS — E1140 Type 2 diabetes mellitus with diabetic neuropathy, unspecified: Secondary | ICD-10-CM | POA: Diagnosis not present

## 2020-09-14 DIAGNOSIS — L602 Onychogryphosis: Secondary | ICD-10-CM | POA: Diagnosis not present

## 2020-09-14 DIAGNOSIS — M6281 Muscle weakness (generalized): Secondary | ICD-10-CM | POA: Diagnosis not present

## 2020-09-15 ENCOUNTER — Emergency Department: Payer: Medicare Other

## 2020-09-15 ENCOUNTER — Other Ambulatory Visit: Payer: Self-pay

## 2020-09-15 ENCOUNTER — Inpatient Hospital Stay
Admission: EM | Admit: 2020-09-15 | Discharge: 2020-10-02 | DRG: 163 | Disposition: A | Discharge status: Skilled Nursing Facility | Payer: Medicare Other | Source: Skilled Nursing Facility | Attending: Internal Medicine | Admitting: Internal Medicine

## 2020-09-15 DIAGNOSIS — T17500A Unspecified foreign body in bronchus causing asphyxiation, initial encounter: Secondary | ICD-10-CM | POA: Diagnosis not present

## 2020-09-15 DIAGNOSIS — E876 Hypokalemia: Secondary | ICD-10-CM | POA: Diagnosis not present

## 2020-09-15 DIAGNOSIS — Z515 Encounter for palliative care: Secondary | ICD-10-CM | POA: Diagnosis not present

## 2020-09-15 DIAGNOSIS — T17928A Food in respiratory tract, part unspecified causing other injury, initial encounter: Secondary | ICD-10-CM | POA: Diagnosis not present

## 2020-09-15 DIAGNOSIS — F319 Bipolar disorder, unspecified: Secondary | ICD-10-CM | POA: Diagnosis present

## 2020-09-15 DIAGNOSIS — R531 Weakness: Secondary | ICD-10-CM | POA: Diagnosis not present

## 2020-09-15 DIAGNOSIS — N179 Acute kidney failure, unspecified: Secondary | ICD-10-CM | POA: Diagnosis not present

## 2020-09-15 DIAGNOSIS — F411 Generalized anxiety disorder: Secondary | ICD-10-CM | POA: Diagnosis present

## 2020-09-15 DIAGNOSIS — J302 Other seasonal allergic rhinitis: Secondary | ICD-10-CM | POA: Diagnosis not present

## 2020-09-15 DIAGNOSIS — E785 Hyperlipidemia, unspecified: Secondary | ICD-10-CM | POA: Diagnosis present

## 2020-09-15 DIAGNOSIS — Z1159 Encounter for screening for other viral diseases: Secondary | ICD-10-CM | POA: Diagnosis not present

## 2020-09-15 DIAGNOSIS — E1165 Type 2 diabetes mellitus with hyperglycemia: Secondary | ICD-10-CM | POA: Diagnosis present

## 2020-09-15 DIAGNOSIS — J9601 Acute respiratory failure with hypoxia: Secondary | ICD-10-CM

## 2020-09-15 DIAGNOSIS — Z7984 Long term (current) use of oral hypoglycemic drugs: Secondary | ICD-10-CM | POA: Diagnosis not present

## 2020-09-15 DIAGNOSIS — E1169 Type 2 diabetes mellitus with other specified complication: Secondary | ICD-10-CM

## 2020-09-15 DIAGNOSIS — D509 Iron deficiency anemia, unspecified: Secondary | ICD-10-CM | POA: Diagnosis present

## 2020-09-15 DIAGNOSIS — R159 Full incontinence of feces: Secondary | ICD-10-CM | POA: Diagnosis not present

## 2020-09-15 DIAGNOSIS — N183 Chronic kidney disease, stage 3 unspecified: Secondary | ICD-10-CM | POA: Diagnosis not present

## 2020-09-15 DIAGNOSIS — Z87891 Personal history of nicotine dependence: Secondary | ICD-10-CM

## 2020-09-15 DIAGNOSIS — R410 Disorientation, unspecified: Secondary | ICD-10-CM | POA: Diagnosis not present

## 2020-09-15 DIAGNOSIS — J690 Pneumonitis due to inhalation of food and vomit: Principal | ICD-10-CM

## 2020-09-15 DIAGNOSIS — F4310 Post-traumatic stress disorder, unspecified: Secondary | ICD-10-CM | POA: Diagnosis present

## 2020-09-15 DIAGNOSIS — E119 Type 2 diabetes mellitus without complications: Secondary | ICD-10-CM

## 2020-09-15 DIAGNOSIS — Z743 Need for continuous supervision: Secondary | ICD-10-CM | POA: Diagnosis not present

## 2020-09-15 DIAGNOSIS — Z4682 Encounter for fitting and adjustment of non-vascular catheter: Secondary | ICD-10-CM | POA: Diagnosis not present

## 2020-09-15 DIAGNOSIS — Z794 Long term (current) use of insulin: Secondary | ICD-10-CM

## 2020-09-15 DIAGNOSIS — J9600 Acute respiratory failure, unspecified whether with hypoxia or hypercapnia: Secondary | ICD-10-CM

## 2020-09-15 DIAGNOSIS — E039 Hypothyroidism, unspecified: Secondary | ICD-10-CM | POA: Diagnosis not present

## 2020-09-15 DIAGNOSIS — I10 Essential (primary) hypertension: Secondary | ICD-10-CM | POA: Diagnosis not present

## 2020-09-15 DIAGNOSIS — T17920A Food in respiratory tract, part unspecified causing asphyxiation, initial encounter: Secondary | ICD-10-CM | POA: Diagnosis not present

## 2020-09-15 DIAGNOSIS — J9690 Respiratory failure, unspecified, unspecified whether with hypoxia or hypercapnia: Secondary | ICD-10-CM | POA: Diagnosis not present

## 2020-09-15 DIAGNOSIS — R0902 Hypoxemia: Secondary | ICD-10-CM | POA: Diagnosis not present

## 2020-09-15 DIAGNOSIS — R5381 Other malaise: Secondary | ICD-10-CM | POA: Diagnosis not present

## 2020-09-15 DIAGNOSIS — Z20822 Contact with and (suspected) exposure to covid-19: Secondary | ICD-10-CM | POA: Diagnosis present

## 2020-09-15 DIAGNOSIS — R4182 Altered mental status, unspecified: Secondary | ICD-10-CM | POA: Diagnosis not present

## 2020-09-15 DIAGNOSIS — T17590A Other foreign object in bronchus causing asphyxiation, initial encounter: Secondary | ICD-10-CM | POA: Diagnosis present

## 2020-09-15 DIAGNOSIS — T17908A Unspecified foreign body in respiratory tract, part unspecified causing other injury, initial encounter: Secondary | ICD-10-CM | POA: Diagnosis not present

## 2020-09-15 DIAGNOSIS — F3112 Bipolar disorder, current episode manic without psychotic features, moderate: Secondary | ICD-10-CM | POA: Diagnosis present

## 2020-09-15 DIAGNOSIS — R279 Unspecified lack of coordination: Secondary | ICD-10-CM | POA: Diagnosis not present

## 2020-09-15 DIAGNOSIS — I739 Peripheral vascular disease, unspecified: Secondary | ICD-10-CM | POA: Diagnosis not present

## 2020-09-15 DIAGNOSIS — Z79890 Hormone replacement therapy (postmenopausal): Secondary | ICD-10-CM

## 2020-09-15 DIAGNOSIS — M6281 Muscle weakness (generalized): Secondary | ICD-10-CM | POA: Diagnosis not present

## 2020-09-15 DIAGNOSIS — M1990 Unspecified osteoarthritis, unspecified site: Secondary | ICD-10-CM | POA: Diagnosis not present

## 2020-09-15 DIAGNOSIS — Z90710 Acquired absence of both cervix and uterus: Secondary | ICD-10-CM

## 2020-09-15 DIAGNOSIS — Z87442 Personal history of urinary calculi: Secondary | ICD-10-CM

## 2020-09-15 DIAGNOSIS — Z888 Allergy status to other drugs, medicaments and biological substances status: Secondary | ICD-10-CM

## 2020-09-15 DIAGNOSIS — R0602 Shortness of breath: Secondary | ICD-10-CM | POA: Diagnosis not present

## 2020-09-15 DIAGNOSIS — J9811 Atelectasis: Secondary | ICD-10-CM | POA: Diagnosis not present

## 2020-09-15 DIAGNOSIS — Z9114 Patient's other noncompliance with medication regimen: Secondary | ICD-10-CM

## 2020-09-15 DIAGNOSIS — R6889 Other general symptoms and signs: Secondary | ICD-10-CM | POA: Diagnosis not present

## 2020-09-15 DIAGNOSIS — Z79899 Other long term (current) drug therapy: Secondary | ICD-10-CM

## 2020-09-15 DIAGNOSIS — Z0189 Encounter for other specified special examinations: Secondary | ICD-10-CM

## 2020-09-15 DIAGNOSIS — Z853 Personal history of malignant neoplasm of breast: Secondary | ICD-10-CM

## 2020-09-15 DIAGNOSIS — Z880 Allergy status to penicillin: Secondary | ICD-10-CM

## 2020-09-15 DIAGNOSIS — Z7189 Other specified counseling: Secondary | ICD-10-CM | POA: Diagnosis not present

## 2020-09-15 DIAGNOSIS — N390 Urinary tract infection, site not specified: Secondary | ICD-10-CM | POA: Diagnosis not present

## 2020-09-15 DIAGNOSIS — Z978 Presence of other specified devices: Secondary | ICD-10-CM

## 2020-09-15 LAB — COMPREHENSIVE METABOLIC PANEL
ALT: 10 U/L (ref 0–44)
AST: 16 U/L (ref 15–41)
Albumin: 3.6 g/dL (ref 3.5–5.0)
Alkaline Phosphatase: 44 U/L (ref 38–126)
Anion gap: 14 (ref 5–15)
BUN: 27 mg/dL — ABNORMAL HIGH (ref 8–23)
CO2: 23 mmol/L (ref 22–32)
Calcium: 9.8 mg/dL (ref 8.9–10.3)
Chloride: 98 mmol/L (ref 98–111)
Creatinine, Ser: 1.28 mg/dL — ABNORMAL HIGH (ref 0.44–1.00)
GFR, Estimated: 44 mL/min — ABNORMAL LOW (ref >60)
Glucose, Bld: 193 mg/dL — ABNORMAL HIGH (ref 70–99)
Potassium: 3.7 mmol/L (ref 3.5–5.1)
Sodium: 135 mmol/L (ref 135–145)
Total Bilirubin: 0.8 mg/dL (ref 0.3–1.2)
Total Protein: 7.5 g/dL (ref 6.5–8.1)

## 2020-09-15 LAB — RESP PANEL BY RT-PCR (FLU A&B, COVID) ARPGX2
Influenza A by PCR: NEGATIVE
Influenza B by PCR: NEGATIVE
SARS Coronavirus 2 by RT PCR: NEGATIVE

## 2020-09-15 LAB — CBC
HCT: 43.6 % (ref 36.0–46.0)
Hemoglobin: 14.7 g/dL (ref 12.0–15.0)
MCH: 29.9 pg (ref 26.0–34.0)
MCHC: 33.7 g/dL (ref 30.0–36.0)
MCV: 88.6 fL (ref 80.0–100.0)
Platelets: 293 10*3/uL (ref 150–400)
RBC: 4.92 MIL/uL (ref 3.87–5.11)
RDW: 13.9 % (ref 11.5–15.5)
WBC: 15.5 10*3/uL — ABNORMAL HIGH (ref 4.0–10.5)
nRBC: 0 % (ref 0.0–0.2)

## 2020-09-15 IMAGING — DX DG CHEST 1V PORT
1 series · 1 of 1 positions shown · non-contrast
Comparison: [DATE]

CLINICAL DATA: Shortness of breath.

EXAM:
PORTABLE CHEST 1 VIEW

[chest ap]
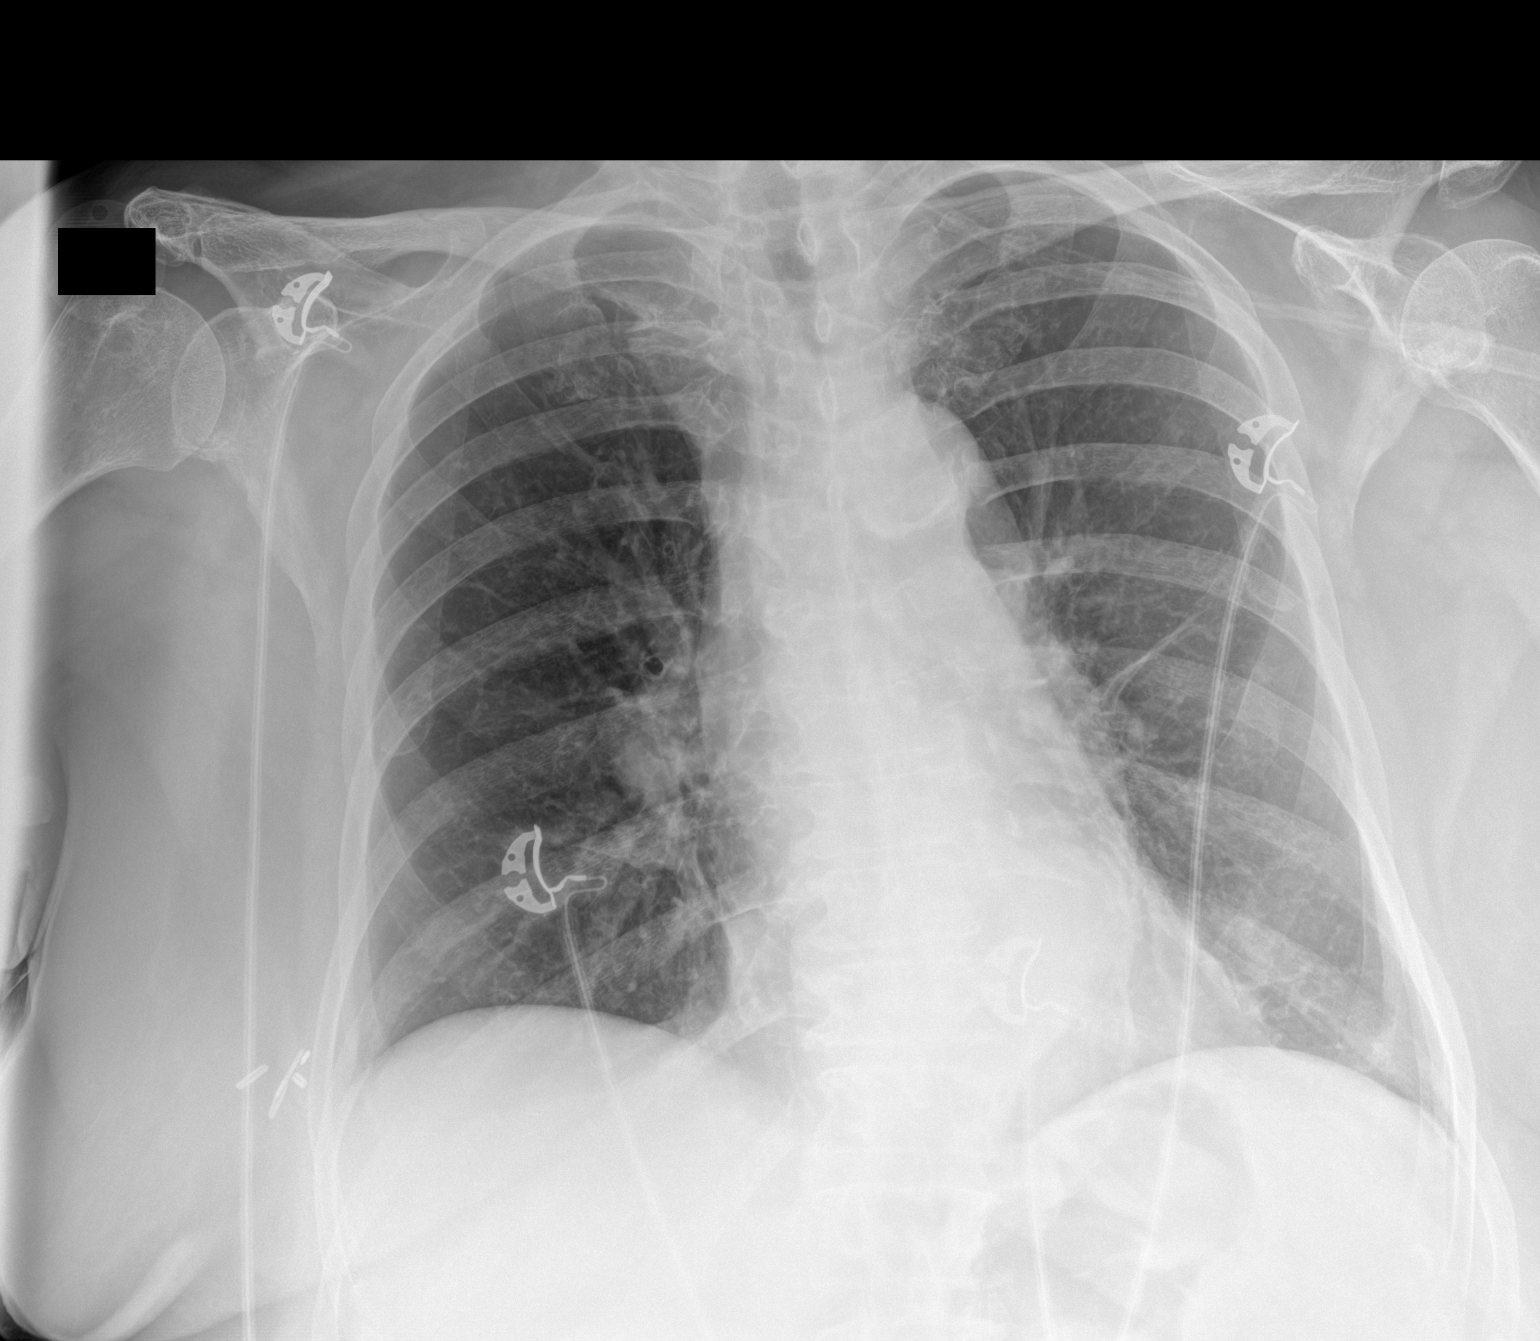

[1 of 1 positions shown; findings below may reference images not displayed]

FINDINGS: The lungs are hyperinflated. Mild, stable scarring is seen within
the left lung base. There is no evidence of a pleural effusion or
pneumothorax. The heart size and mediastinal contours are within
normal limits. Tortuosity and calcification of the descending
thoracic aorta is seen. Radiopaque surgical clips are seen overlying
the soft tissues of the right breast. Degenerative changes are noted
throughout the thoracic spine.
IMPRESSION: Stable left basilar scarring without acute or active cardiopulmonary
disease.

## 2020-09-15 MED ORDER — SODIUM CHLORIDE 0.9 % IV SOLN
1.0000 g | Freq: Three times a day (TID) | INTRAVENOUS | Status: DC
  Administered 2020-09-15 – 2020-09-16 (×2): 1 g via INTRAVENOUS
  Filled 2020-09-15 (×2): qty 1

## 2020-09-15 MED ORDER — INSULIN ASPART 100 UNIT/ML IJ SOLN: 0.0000 [IU] | Freq: Four times a day (QID) | INTRAMUSCULAR | Status: DC

## 2020-09-15 NOTE — H&P (Signed)
History and Physical    PLEASE NOTE THAT DRAGON DICTATION SOFTWARE WAS USED IN THE CONSTRUCTION OF THIS NOTE.   [name] TIR:[id number] DOB: 11-11-1945 DOA: 09/15/2020  PCP: Mechele Claude, FNP Patient coming from: SNF  I have personally briefly reviewed patient's old medical records in [place]  Chief Complaint: Aspiration  HPI: [name] is a 75 y.o. female with medical history significant for type 2 diabetes mellitus, essential hypertension, acquired hypothyroidism, who is admitted to [facility] on 09/15/2020 with acute hypoxic respiratory failure due to suspected aspiration pneumonia after presenting from SNF to [facility] ED for evaluation of potential aspiration.   Bilirubin this evening, the patient was witnessed by the SNF staff to start choking and coughing on her dinner, which included a hotdog.  The patient reportedly continued a significant continuous coughing for approximately 30 minutes, during which staff noted the patient to cough up several bits of her dinner, including hotdog.  After this timeframe, staff contacted EMS, who noted the patient's initial oxygen saturation to be in the low 80s on room air, prompting initiation of nonrebreather for bringing the patient to [facility] ED for further evaluation.  No known baseline supplemental oxygen requirements, and no known underlying chronic pulmonary pathology.   Per my discussion with the patient, she reports mild shortness of breath and continued coughing.  Denies any associated subjective fever, chills, rigors, or generalized myalgias.  She also denies any chest pains, palpitations, diaphoresis, nausea, vomiting.  Not associate with any wheezing or hemoptysis.  Denies any associated orthopnea, PND, or new onset peripheral edema. It is unclear if the patient has any known underlying history of dysphagia or requirements for modified diet.     ED Course:  Vital signs in the ED were  notable for the following: Temperature max 97.5, heart rate 95-96; initial respiratory rate noted to be 24, with subsequent improvement into the range of 14-15; blood pressure 104/72-112/78; supplemental oxygen delivery has been transitioned from nonrebreather to 6 L nasal cannula, upon which the patient has been maintaining oxygen saturations in the range of 94 to 98%.  Labs were notable for the following: CMP was notable for the following: Sodium 135, bicarbonate 23, BUN 27, creatinine 1.28 relative to most recent prior creatinine data point of 1.03 on 08/24/2020.  CBC notable for white blood cell count of 15,000.  Nasopharyngeal COVID-19/influenza PCR were performed in the ED today, and found to be negative.  Chest x-ray showed no evidence of acute cardiopulmonary process.  The EDP discussed the patient's case with the on-call critical care physician, Dr. Keenan Bachelor, who recommended admission to the hospitalist service, with plan for critical care service to follow along, with anticipated bronchoscopy tomorrow (09/16/20) along with interval antibiotics for suspected aspiration pneumonia, including specific recommendation for meropenem in the setting of reported penicillin allergy.  While in the ED, the following were administered: Meropenem 1 g IV x1.  Subsequently, the patient was admitted to the PCU for further evaluation and management of presenting acute hypoxic respiratory failure in the setting of suspected aspiration pneumonia.      Review of Systems: As per HPI otherwise 10 point review of systems negative.   Past Medical History:  Diagnosis Date  . Arthritis   . Breast cancer ([place])   . Cancer [facility]) 2004   right breast ca  . Diabetes mellitus without complication ([place])   . Hypercholesterolemia   . Hypertension   . Hypothyroid   . Seasonal allergies  Past Surgical History:  Procedure Laterality Date  . ABDOMINAL HYSTERECTOMY    . BREAST BIOPSY Left    neg  . BREAST  EXCISIONAL BIOPSY Right 2004   breast ca and lumpectomy  . BREAST LUMPECTOMY Right 2004   breast ca  . CHOLECYSTECTOMY    . COLONOSCOPY WITH PROPOFOL N/A 02/13/2017   Procedure: COLONOSCOPY WITH PROPOFOL;  Surgeon: Jonathon Bellows, MD;  Location: [facility] ENDOSCOPY;  Service: Gastroenterology;  Laterality: N/A;  . KIDNEY STONE SURGERY      Social History:  reports that she has quit smoking. Her smoking use included cigarettes. She has never used smokeless tobacco. She reports that she does not drink alcohol and does not use drugs.   Allergies  Allergen Reactions  . Navane [Thiothixene] Other (See Comments)    Reaction:  Unknown  . Penicillins Rash    Has patient had a PCN reaction causing immediate rash, facial/tongue/throat swelling, SOB or lightheadedness with hypotension: No Has patient had a PCN reaction causing severe rash involving mucus membranes or skin necrosis: No Has patient had a PCN reaction that required hospitalization: No Has patient had a PCN reaction occurring within the last 10 years: No If all of the above answers are "NO", then may proceed with Cephalosporin use.  Has patient had a PCN reaction causing immediate rash, facial/tongue/throat swelling, SOB or lightheadedness with hypotension: No Has patient had a PCN reaction causing severe rash involving mucus membranes or skin necrosis: No Has patient had a PCN reaction that required hospitalization: No Has patient had a PCN reaction occurring within the last 10 years: No If all of the above answers are "NO", then may proceed with Cephalosporin use. Has patient had a PCN reaction causing immediate rash, facial/tongue/throat swelling, SOB or lightheadedness with hypotension: No Has patient had a PCN reaction causing severe rash involving mucus membranes or skin necrosis: No Has patient had a PCN reaction that required hospitalization: No Has patient had a PCN reaction occurring within the last 10 years: No If all of the  above answers are "NO", then may proceed with Cephalosporin use.    Family History  Problem Relation Age of Onset  . Bladder Cancer Neg Hx   . Kidney cancer Neg Hx      Prior to Admission medications   Medication Sig Start Date End Date Taking? Authorizing Provider  clonazePAM (KLONOPIN) 1 MG tablet Take 1 tablet (1 mg total) by mouth 2 (two) times daily. 08/24/20   Lorella Nimrod, MD  divalproex (DEPAKOTE ER) 500 MG 24 hr tablet Take 2 tablets (1,000 mg total) by mouth at bedtime. 01/11/20   Clapacs, Madie Reno, MD  enalapril (VASOTEC) 5 MG tablet Take 1 tablet (5 mg total) by mouth 2 (two) times daily. 01/11/20   Clapacs, Madie Reno, MD  FLUoxetine (PROZAC) 20 MG capsule Take 60 mg by mouth daily.    [provider]  insulin detemir (LEVEMIR) 100 UNIT/ML injection Inject 0.15 mLs (15 Units total) into the skin daily. 07/31/20   Charlynne Cousins, MD  LATUDA 20 MG TABS tablet Take 20 mg by mouth daily. 08/17/20   [provider]  levothyroxine (SYNTHROID) 88 MCG tablet Take 88 mcg by mouth daily before breakfast.    [provider]  linagliptin (TRADJENTA) 5 MG TABS tablet Take 5 mg by mouth daily.    [provider]  loratadine (CLARITIN) 10 MG tablet Take 1 tablet (10 mg total) by mouth daily. 01/11/20   Clapacs, Madie Reno, MD  metFORMIN (GLUCOPHAGE) 500 MG tablet Take 2 tablets (1,000 mg total) by mouth 2 (two) times daily with a meal. 07/31/20   Charlynne Cousins, MD  ondansetron (ZOFRAN-ODT) 4 MG disintegrating tablet Take 4 mg by mouth every 6 (six) hours as needed for nausea. 08/16/20   [provider]  rosuvastatin (CRESTOR) 40 MG tablet Take 1 tablet (40 mg total) by mouth daily. 01/11/20   Clapacs, Madie Reno, MD     Objective    Physical Exam: Vitals:   09/15/20 2044 09/15/20 2046 09/15/20 2100 09/15/20 2200  BP:  112/78 104/72 108/61  Pulse: 96  95 96  Resp:   14 15  Temp: (!) 97.5 F (36.4 C)     TempSrc: Axillary     SpO2:   94% 95%   Weight:      Height:        General: appears to be stated age; alert;  Skin: warm, dry, no rash Head:  AT/[place] Mouth:  Oral mucosa membranes appear dry, normal dentition Neck: supple; trachea midline Heart:  RRR; did not appreciate any M/R/G Lungs: b/l rhonchi noted; no wheezing appreciated.  Abdomen: + BS; soft, ND, NT Vascular: 2+ pedal pulses b/l; 2+ radial pulses b/l Extremities: no peripheral edema, no muscle wasting Neuro: strength and sensation intact in upper and lower extremities b/l    Labs on Admission: I have personally reviewed following labs and imaging studies  CBC: Recent Labs  Lab 09/15/20 2052  WBC 15.5*  HGB 14.7  HCT 43.6  MCV 88.6  PLT 106   Basic Metabolic Panel: No results for input(s): NA, K, CL, CO2, GLUCOSE, BUN, CREATININE, CALCIUM, MG, PHOS in the last 168 hours. GFR: CrCl cannot be calculated (Patient's most recent lab result is older than the maximum 21 days allowed.). Liver Function Tests: No results for input(s): AST, ALT, ALKPHOS, BILITOT, PROT, ALBUMIN in the last 168 hours. No results for input(s): LIPASE, AMYLASE in the last 168 hours. No results for input(s): AMMONIA in the last 168 hours. Coagulation Profile: No results for input(s): INR, PROTIME in the last 168 hours. Cardiac Enzymes: No results for input(s): CKTOTAL, CKMB, CKMBINDEX, TROPONINI in the last 168 hours. BNP (last 3 results) No results for input(s): PROBNP in the last 8760 hours. HbA1C: No results for input(s): HGBA1C in the last 72 hours. CBG: No results for input(s): GLUCAP in the last 168 hours. Lipid Profile: No results for input(s): CHOL, HDL, LDLCALC, TRIG, CHOLHDL, LDLDIRECT in the last 72 hours. Thyroid Function Tests: No results for input(s): TSH, T4TOTAL, FREET4, T3FREE, THYROIDAB in the last 72 hours. Anemia Panel: No results for input(s): VITAMINB12, FOLATE, FERRITIN, TIBC, IRON, RETICCTPCT in the last 72 hours. Urine analysis:    Component Value  Date/Time   COLORURINE YELLOW (A) 08/22/2020 0147   APPEARANCEUR CLOUDY (A) 08/22/2020 0147   APPEARANCEUR Clear 02/24/2017 1050   LABSPEC 1.016 08/22/2020 0147   LABSPEC 1.018 02/25/2014 0927   PHURINE 6.0 08/22/2020 0147   GLUCOSEU NEGATIVE 08/22/2020 0147   GLUCOSEU Negative 02/25/2014 0927   HGBUR MODERATE (A) 08/22/2020 0147   BILIRUBINUR NEGATIVE 08/22/2020 0147   BILIRUBINUR Negative 02/24/2017 1050   BILIRUBINUR Negative 02/25/2014 0927   KETONESUR 5 (A) 08/22/2020 0147   PROTEINUR 100 (A) 08/22/2020 0147   NITRITE NEGATIVE 08/22/2020 0147   LEUKOCYTESUR LARGE (A) 08/22/2020 0147   LEUKOCYTESUR 3+ 02/25/2014 0927    Radiological Exams on Admission: DG Chest Portable 1 View  Result Date: 09/15/2020 CLINICAL DATA:  Shortness of breath. EXAM: PORTABLE CHEST 1 VIEW COMPARISON:  August 21, 2020 FINDINGS: The lungs are hyperinflated. Mild, stable scarring is seen within the left lung base. There is no evidence of a pleural effusion or pneumothorax. The heart size and mediastinal contours are within normal limits. Tortuosity and calcification of the descending thoracic aorta is seen. Radiopaque surgical clips are seen overlying the soft tissues of the right breast. Degenerative changes are noted throughout the thoracic spine. IMPRESSION: Stable left basilar scarring without acute or active cardiopulmonary disease. Electronically Signed   By: Virgina Norfolk M.D.   On: 09/15/2020 21:33     Assessment/Plan   Tyson Darthy Manganelli is a 75 y.o. female with medical history significant for type 2 diabetes mellitus, essential hypertension, acquired hypothyroidism, who is admitted to [facility] on 09/15/2020 with acute hypoxic respiratory failure due to suspected aspiration pneumonia after presenting from SNF to [facility] ED for evaluation of potential aspiration.    Principal Problem:   Acute respiratory failure with hypoxia (HCC) Active Problems:   Essential  hypertension   Hypothyroidism   Dyslipidemia   Type 2 diabetes mellitus without complication ([place])   AKI (acute kidney injury) ([place])   Aspiration pneumonia ([place])    #) Acute hypoxic respiratory failure: In the context of no known baseline supplemental oxygen requirements, the patient presents with initial oxygen saturations in the low 80s on room air, which is subsequently improved into the mid to high 90s on 6 L nasal cannula.  This is in the context of a reported witnessed aspiration event, with ensuing concern for aspiration pneumonia versus aspiration pneumonitis.  No known chronic underlying pulmonary pathology.  Chest x-ray may represent a false negative in the setting of temporal proximity to reported aspiration event as well as potential false negative in the setting of mild dehydration.  Clinically, presentation appears less suggestive of acute PE.  No clinical or radiographic evidence to suggest acutely decompensated heart failure.  ACS also is felt to be less likely at this time, although will check EKG [place] for evaluation of presenting acute hypoxic respiratory failure.  The EDP discussed the patient's case with the on-call critical care physician, Dr. Keenan Bachelor, who recommended admission to the hospitalist service, with plan for critical care service to follow along, with anticipated bronchoscopy tomorrow (09/16/20) along with interval antibiotics for suspected aspiration pneumonia, including specific recommendation for meropenem in the setting of reported penicillin allergy.    Plan: NPO.  Continue meropenem.  Monitor on telemetry.  Monitor continuous pulse oximetry.  Critical care service consulted, with plan for bronchoscopy tomorrow, as further described above repeat chest x-ray in the morning.  Check EKG.  Monitor strict I's and O's.       #) Aspiration pneumonia: In the setting of reported witnessed aspiration event, with ongoing coughing, increased work of breathing, and  rhonchi, presentation is suggestive of underlying aspiration pneumonia, although the differential also includes aspiration pneumonitis.  Of note, presenting chest x-ray showed no evidence of acute cardiopulmonary process, this may represent a false negative, as further described above.  We will repeat chest x-ray in the morning after gentle overnight IV fluid rehydration in order to monitor for interval change in this radiographic appearance.  Meropenem initiated per recommendation of critical care service, as above.  Of note, aside from leukocytosis, no SIRS criteria met at this time.  Therefore patient does not meet criteria to be considered septic at this time.  No evidence of hypotension.  Plan: Check  blood cultures x2.  Continue meropenem.  Repeat CBC with differential in the morning.  NPO.  Critical care service consulted, with anticipation for bronchoscopy in the morning.      #) Acute kidney injury: In the setting of baseline creatinine of 1.0, with most recent prior value noted to be 1.03.  The first week of April 2022, presenting creatinine noted to be 1.28.  Suspect that this is prerenal in nature, with possible multifactorial contributions, including diminished renal perfusion in the setting of presenting acute hypoxic respiratory failure, as above, in addition to clinical evidence to suggest mild dehydration.  Unlikely her outpatient ACE inhibitor has had time to pose a contributory negative effect on her renal function.  However, we will continue to hold outpatient ACE inhibitor during current n.p.o. status while closely monitoring ensuing renal function.  Plan: Urinalysis with microscopy, with attention for the presence of urinary casts.  Add on random urine sent as well as random urine creatinine.  Hold home enalapril for now.  Monitor strict I's and O's and daily weights.  Attempt to avoid nephrotoxic agents.  Gentle overnight IV fluids in the form of lactated Ringer's at 50 cc/h x 10  hours.  Repeat BMP in the morning.  Work-up and management of presenting acute hypoxic respiratory failure, as above.      #) Allergic rhinitis: Documented history of seasonal allergies on Claritin as an outpatient.  Plan: Hold home Claritin for now in the setting of current n.p.o. status.      #) Acquired hypothyroidism: On Synthroid as an outpatient.  Plan: Hold home Synthroid for now in the setting of current n.p.o. status.        #) Essential hypertension: Outpatient antihypertensive regimen appears limited to enalapril.  Presenting blood pressures found to be normotensive.  In the setting of current n.p.o. status as well as presenting acute kidney injury, will hold home enalapril for now.  Plan: Hold home enalapril, as above.  Close monitoring of ensuing blood pressure via routine vital signs.      #) Type 2 diabetes mellitus: On Levemir 15 units subcu daily as well as Tradjenta and metformin as an outpatient.  Presented blood sugar per presenting CMP noted to be 193.  Plan: Hold home basal insulin for now.  Additionally, will hold home Tradjenta and metformin during this hospitalization.  Accu-Cheks before every meal and at bedtime with low-dose sliding scale insulin has been ordered.      #) Generalized anxiety disorder: On Prozac as well as scheduled clonazepam as an outpatient.  Plan: In the setting of current n.p.o. status, will hold home scheduled clonazepam as well as Prozac.  May consider as needed IV Ativan if the patient demonstrates evidence suggestive of anxiety.     DVT prophylaxis: SCDs Code Status: Full code Family Communication: none Disposition Plan: Per Rounding Team Consults called: Case discussed with on-call critical care physician, Dr. Keenan Bachelor, as further detailed above. Admission status: Inpatient; PCU     Of note, this patient was added by me to the following Admit List/Treatment Team: armcadmits.      PLEASE NOTE THAT  DRAGON DICTATION SOFTWARE WAS USED IN THE CONSTRUCTION OF THIS NOTE.   [facility] Pager [phone] From 12PM - 12AM  Otherwise, please contact night-coverage  [email or web address] Password [facility]   09/15/2020, 10:41 PM

## 2020-09-15 NOTE — ED Triage Notes (Signed)
Pt from [place] [place] [facility] with shob. Per ems pt was choking on a hotdog pta. Pt arrives on non rebreather, pt is able to speak, md at bedside. Per ems 83% on NRB at 15lpm on their arrival.

## 2020-09-15 NOTE — ED Provider Notes (Signed)
[facility] Emergency Department Provider Note  Time seen: 9:01 PM  I have reviewed the triage vital signs and the nursing notes.   HISTORY  Chief Complaint Shortness of Breath   HPI [name] is a 75 y.o. female with a past medical history of arthritis, diabetes, hypertension, hyperlipidemia, presents to the emergency department for shortness of breath and choking.  According to EMS report patient was at her nursing facility eating hot dogs when she began choking.  Patient coughed up chunks of hotdog per report.  Patient continued to have frothy sputum after that with continued trouble breathing.  EMS states when they arrived patient was satting 80%, was placed on a nonrebreather mask and transported to the hospital.  They state patient satting 88 to 90% on 15 L nonrebreather.  Here upon arrival patient is satting about 90% on 15 L.  However after sitting up in bed and calm down we are able to titrate down to 6 L nasal cannula patient satting around 92% currently.  Patient denies any chest pain but does state shortness of breath.   Past Medical History:  Diagnosis Date  . Arthritis   . Breast cancer ([place])   . Cancer [facility]) 2004   right breast ca  . Diabetes mellitus without complication ([place])   . Hypercholesterolemia   . Hypertension   . Hypothyroid   . Seasonal allergies     Patient Active Problem List   Diagnosis Date Noted  . Generalized weakness 08/22/2020  . UTI (urinary tract infection) 08/22/2020  . Unable to care for self 08/22/2020  . Bipolar 1 disorder ([place]) 08/22/2020  . Weakness 08/22/2020  . AKI (acute kidney injury) ([place]) 08/22/2020  . Bipolar disorder current episode depressed ([place]) 01/07/2020  . MDD (major depressive disorder) 08/10/2019  . MDD (major depressive disorder), recurrent episode, severe ([place]) 06/24/2019  . Chronic kidney disease, stage 3 unspecified ([place]) 05/11/2019  . Pain due to onychomycosis of toenails of both  feet 11/23/2018  . Bipolar 1 disorder, manic, moderate ([place]) 07/21/2018  . Bipolar 1 disorder, mixed, severe ([place]) 07/21/2018  . Bipolar 1 disorder with moderate mania ([place]) 07/20/2018  . Severe bipolar I disorder, current or most recent episode depressed ([place]) 07/07/2017  . PTSD (post-traumatic stress disorder) 07/07/2017  . 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 Type 2 diabetes mellitus without complication ([place]) 09/62/8366  . Essential hypertension 09/21/2014  . Hypothyroidism 09/21/2014  . Dyslipidemia 09/21/2014  . Bipolar I disorder, current or most recent episode manic, severe with mixed features ([place]) 09/20/2014    Past Surgical History:  Procedure Laterality Date  . ABDOMINAL HYSTERECTOMY    . BREAST BIOPSY Left    neg  . BREAST EXCISIONAL BIOPSY Right 2004   breast ca and lumpectomy  . BREAST LUMPECTOMY Right 2004   breast ca  . CHOLECYSTECTOMY    . COLONOSCOPY WITH PROPOFOL N/A 02/13/2017   Procedure: COLONOSCOPY WITH PROPOFOL;  Surgeon: Jonathon Bellows, MD;  Location: [facility] ENDOSCOPY;  Service: Gastroenterology;  Laterality: N/A;  . KIDNEY STONE SURGERY      Prior to Admission medications   Medication Sig Start Date End Date Taking? Authorizing Provider  clonazePAM (KLONOPIN) 1 MG tablet Take 1 tablet (1 mg total) by mouth 2 (two) times daily. 08/24/20   Lorella Nimrod, MD  divalproex (DEPAKOTE ER) 500 MG 24 hr tablet Take 2 tablets (1,000 mg total) by mouth at bedtime. 01/11/20   Clapacs, Madie Reno, MD  enalapril (VASOTEC) 5 MG tablet Take 1 tablet (5 mg total) by mouth 2 (two) times daily. 01/11/20   Clapacs, Madie Reno, MD  FLUoxetine (PROZAC) 20 MG capsule Take 60 mg by mouth daily.    [provider]  insulin detemir (LEVEMIR) 100 UNIT/ML injection Inject 0.15 mLs (15 Units total) into the skin daily. 07/31/20   Charlynne Cousins, MD  LATUDA 20 MG TABS tablet Take 20 mg by mouth daily. 08/17/20   [provider]   levothyroxine (SYNTHROID) 88 MCG tablet Take 88 mcg by mouth daily before breakfast.    [provider]  linagliptin (TRADJENTA) 5 MG TABS tablet Take 5 mg by mouth daily.    [provider]  loratadine (CLARITIN) 10 MG tablet Take 1 tablet (10 mg total) by mouth daily. 01/11/20   Clapacs, Madie Reno, MD  metFORMIN (GLUCOPHAGE) 500 MG tablet Take 2 tablets (1,000 mg total) by mouth 2 (two) times daily with a meal. 07/31/20   Charlynne Cousins, MD  ondansetron (ZOFRAN-ODT) 4 MG disintegrating tablet Take 4 mg by mouth every 6 (six) hours as needed for nausea. 08/16/20   [provider]  rosuvastatin (CRESTOR) 40 MG tablet Take 1 tablet (40 mg total) by mouth daily. 01/11/20   Clapacs, Madie Reno, MD    Allergies  Allergen Reactions  . Navane [Thiothixene] Other (See Comments)    Reaction:  Unknown  . Penicillins Rash    Has patient had a PCN reaction causing immediate rash, facial/tongue/throat swelling, SOB or lightheadedness with hypotension: No Has patient had a PCN reaction causing severe rash involving mucus membranes or skin necrosis: No Has patient had a PCN reaction that required hospitalization: No Has patient had a PCN reaction occurring within the last 10 years: No If all of the above answers are "NO", then may proceed with Cephalosporin use.  Has patient had a PCN reaction causing immediate rash, facial/tongue/throat swelling, SOB or lightheadedness with hypotension: No Has patient had a PCN reaction causing severe rash involving mucus membranes or skin necrosis: No Has patient had a PCN reaction that required hospitalization: No Has patient had a PCN reaction occurring within the last 10 years: No If all of the above answers are "NO", then may proceed with Cephalosporin use. Has patient had a PCN reaction causing immediate rash, facial/tongue/throat swelling, SOB or lightheadedness with hypotension: No Has patient had a PCN reaction causing severe rash  involving mucus membranes or skin necrosis: No Has patient had a PCN reaction that required hospitalization: No Has patient had a PCN reaction occurring within the last 10 years: No If all of the above answers are "NO", then may proceed with Cephalosporin use.    Family History  Problem Relation Age of Onset  . Bladder Cancer Neg Hx   . Kidney cancer Neg Hx     Social History Social History   Tobacco Use  . Smoking status: Former Smoker    Types: Cigarettes  . Smokeless tobacco: Never Used  Vaping Use  . Vaping Use: Never used  Substance Use Topics  . Alcohol use: No  . Drug use: No    Review of Systems Constitutional: Negative for fever. Cardiovascular: Negative for chest pain. Respiratory: Positive for shortness of breath.  Positive for choking earlier tonight Gastrointestinal: Negative for abdominal pain.  States she was putting up/vomiting pieces of hotdog. Musculoskeletal: Negative for musculoskeletal complaints Neurological: Negative for headache All other ROS negative  ____________________________________________   PHYSICAL EXAM:  VITAL SIGNS:  ED Triage Vitals  Enc Vitals Group     BP 09/15/20 2046 112/78     Pulse Rate 09/15/20 2044 96     Resp 09/15/20 2041 (!) 24     Temp 09/15/20 2044 (!) 97.5 F (36.4 C)     Temp Source 09/15/20 2041 Axillary     SpO2 09/15/20 2041 (!) 89 %     Weight 09/15/20 2043 180 lb (81.6 kg)     Height 09/15/20 2043 5\' 3"  (1.6 m)     Head Circumference --      Peak Flow --      Pain Score --      Pain Loc --      Pain Edu? --      Excl. in [place]? --    Constitutional: Alert and oriented. Well appearing and in no distress. Eyes: Normal exam ENT      Head: Normocephalic and atraumatic.      Mouth/Throat: Mucous membranes are moist. Cardiovascular: Normal rate, regular rhythm.  Respiratory: Normal respiratory effort without tachypnea nor retractions. Breath sounds are clear Gastrointestinal: Soft and nontender. No  distention.   Musculoskeletal: Nontender with normal range of motion in all extremities.  Neurologic:  Normal speech and language. No gross focal neurologic deficits  Skin:  Skin is warm, dry and intact.  Psychiatric: Mood and affect are normal.   ____________________________________________     RADIOLOGY  Chest x-ray is negative for acute abnormality.  ____________________________________________   INITIAL IMPRESSION / ASSESSMENT AND PLAN / ED COURSE  Pertinent labs & imaging results that were available during my care of the patient were reviewed by me and considered in my medical decision making (see chart for details).   Patient presents emergency department after choking episode.  Patient was choking on hot dogs coughed/vomited up several chunks of hotdog per EMS report however patient satting 88 to 90% on arrival on 15 L.  However after sitting the patient up in bed and she was able to relax she is now satting 92% on 6 L.  She is mildly tachypneic.  No obvious rhonchi or stridor on exam.  We will discuss with ICU for consideration of possible bronchoscopy.  We will check labs, chest x-ray and continue to closely monitor.  Patient's chest x-ray is negative for acute abnormality.  ICU nurse practitioner has been down to see the patient.  They will consult along but would prefer the hospitalist admit the patient to their service.  She is discussed with Dr. Craig Staggers for possible bronchoscopy in the morning.  Patient is stable currently on 6 L satting 94%.  Chest x-ray does not appear to show any abnormality.  Nurse practitioner states she will order antibiotics to cover for possible aspiration pneumonia we will admit to the hospital service.  Tylin Stradley was evaluated in Emergency Department on 09/15/2020 for the symptoms described in the history of present illness. She was evaluated in the context of the global COVID-19 pandemic, which necessitated consideration that the  patient might be at risk for infection with the SARS-CoV-2 virus that causes COVID-19. Institutional protocols and algorithms that pertain to the evaluation of patients at risk for COVID-19 are in a state of rapid change based on information released by regulatory bodies including the CDC and federal and state organizations. These policies and algorithms were followed during the patient's care in the ED.  ____________________________________________   FINAL CLINICAL IMPRESSION(S) / ED DIAGNOSES  Aspiration Dyspnea   Harvest Dark, MD  09/15/20 2145  

## 2020-09-15 NOTE — Consult Note (Signed)
NAME:  [name], MRN:  [id number], DOB:  [birth year], LOS: 0 ADMISSION DATE:  09/15/2020, CONSULTATION DATE: 09/15/2020 REFERRING MD: Dr. Kerman Passey, CHIEF COMPLAINT: Shortness of breath  History of Present Illness:  75 year old female arrived at [facility] ED via EMS from her nursing facility where she had been in her normal state of health until choking on a hot dog during her last meal.  Per ED documentation and EMS report the patient was able to cough up chunks of hot dog followed by frothy sputum and some difficulty breathing. Per ED documentation EMS stated the patient's SPO2 was 80%, she was placed on nonrebreather and transported to the ED.   ED course: Upon arrival patient's SPO2 was 90% on 15 L.  Once the patient was settled in a stretcher and sat up having calm down oxygen was able to be titrated to 6 L nasal cannula.  Initial vitals: Afebrile at 97.5, RR 14, NSR 95, BP 104/72 with a MAP of 83, SPO2 94% on 6 L nasal cannula. Significant labs: AKI with BUN/Cr: 27/1.28, leukocytosis at 15.5, hyperglycemia at 193, COVID-19 negative, CXR negative for acute disease.  PCCM consulted to evaluate possible bronchoscopy.  Pertinent  Medical History  Hypertension Hypothyroidism Hypercholesterolemia Type 2 diabetes mellitus Breast cancer -right breast 2004 Arthritis MDD Bipolar disorder PTSD Significant Hospital Events: Including procedures, antibiotic start and stop dates in addition to other pertinent events   . 09/15/20- Patient admitted to PCU after " choking event" and acute oxygen need.  Interim History / Subjective:  Patient alert and responsive sitting up in ED stretcher able to self suction with Yankauer device.  Audible secretions at back of throat the patient able to cough them up and self suction appropriately.  Patient denies any previous events with difficulty swallowing.  Patient admits some mild continued shortness of breath, denies need for oxygen use at home & only  other associated symptom is a sore throat when attempting to swallow.  Objective   Blood pressure 108/61, pulse 96, temperature (!) 97.5 F (36.4 C), temperature source Axillary, resp. rate 15, height 5\' 3"  (1.6 m), weight 81.6 kg, SpO2 95 %.       No intake or output data in the 24 hours ending 09/15/20 2231 Filed Weights   09/15/20 2043  Weight: 81.6 kg    Examination: General: Adult female, acutely ill, lying in bed, NAD HEENT: MM pink/moist, anicteric, atraumatic, glasses in place neck supple Neuro: A&O x 4, able to follow commands, PERRL +3, MAE CV: s1s2 RRR, NSR-ST on monitor, no r/m/g Pulm: Regular, non labored on 6 L nasal cannula, breath sounds clear-BUL & diminished-BLL GI: soft, rounded, non tender, bs x 4 Skin: Limited exam- no rashes/lesions noted Extremities: warm/dry, pulses + 2 R/P, no edema noted  Labs/imaging that I have personally reviewed  (right click and "Reselect all SmartList Selections" daily)  Na+/ K+: 135/3.7 BUN/Cr.:  27/1.28 Serum CO2/ AG: 23/14  Hgb: 14.7 WBC/ TMAX: 15.5/afebrile  CXR 09/15/2020: Stable left basilar scarring without acute or active cardiopulmonary disease Resolved Hospital Problem list     Assessment & Plan:  Acute Hypoxic Respiratory Failure secondary to choking event Leukocytosis- 15.5 Suspected Aspiration Patient requiring acute oxygen post choking event. Nursing has been able to wean from NRB to Nasal cannula. Patient is self-suctioning with Yankauer. Discussed case with Dr. Lanney Gins who looked over patient's chart & imaging. No need for emergent bronchoscopy, query esophageal issue? - Supplemental O2 to maintain SpO2 > 90% - Intermittent chest  x-ray & ABG PRN - Ensure adequate pulmonary hygiene: flutter valve Q 2 while awake - trend PCT - Suggest initiation of Aspiration Pna coverage due to leukocytosis - Bronchodilators PRN - consider bronchoscopy if needed  Acute Kidney Injury in the setting of ACE  therapy Baseline Cr:1.0 , Cr on admission:1.28 - Strict I/O's: alert provider if UOP < 0.5 mL/kg/hr - gentle IVF hydration  - Daily BMP, replace electrolytes PRN - Avoid nephrotoxic agents as able, ensure adequate renal perfusion - consider renal US  Best practice (right click and "Reselect all SmartList Selections" daily)  Diet:  NPO Pain/Anxiety/Delirium protocol (if indicated): No VAP protocol (if indicated): Not indicated DVT prophylaxis: SCD * per primary GI prophylaxis: N/A* per primary Glucose control:  SSI Yes Central venous access:  N/A Arterial line:  N/A Foley:  N/A Mobility:  Per primary PT consulted: N/A Last date of multidisciplinary goals of care discussion 09/15/20 Code Status:  full code Disposition: PCU  Labs   CBC: Recent Labs  Lab 09/15/20 2052  WBC 15.5*  HGB 14.7  HCT 43.6  MCV 88.6  PLT 0000000    Basic Metabolic Panel: No results for input(s): NA, K, CL, CO2, GLUCOSE, BUN, CREATININE, CALCIUM, MG, PHOS in the last 168 hours. GFR: CrCl cannot be calculated (Patient's most recent lab result is older than the maximum 21 days allowed.). Recent Labs  Lab 09/15/20 2052  WBC 15.5*    Liver Function Tests: No results for input(s): AST, ALT, ALKPHOS, BILITOT, PROT, ALBUMIN in the last 168 hours. No results for input(s): LIPASE, AMYLASE in the last 168 hours. No results for input(s): AMMONIA in the last 168 hours.  ABG No results found for: PHART, PCO2ART, PO2ART, HCO3, TCO2, ACIDBASEDEF, O2SAT   Coagulation Profile: No results for input(s): INR, PROTIME in the last 168 hours.  Cardiac Enzymes: No results for input(s): CKTOTAL, CKMB, CKMBINDEX, TROPONINI in the last 168 hours.  HbA1C: Hemoglobin A1C  Date/Time Value Ref Range Status  03/26/2012 06:17 AM 6.7 (H) 4.2 - 6.3 % Final    Comment:    The American Diabetes Association recommends that a primary goal of therapy should be <7% and that physicians should reevaluate the treatment regimen  in patients with HbA1c values consistently >8%.    Hgb A1c MFr Bld  Date/Time Value Ref Range Status  07/24/2020 01:50 PM 14.2 (H) 4.8 - 5.6 % Final    Comment:    (NOTE) Pre diabetes:          5.7%-6.4%  Diabetes:              >6.4%  Glycemic control for   <7.0% adults with diabetes   01/10/2020 03:12 PM 9.2 (H) 4.8 - 5.6 % Final    Comment:    (NOTE)         Prediabetes: 5.7 - 6.4         Diabetes: >6.4         Glycemic control for adults with diabetes: <7.0     CBG: No results for input(s): GLUCAP in the last 168 hours.  Review of Systems: Positives in BOLD  Gen: Denies fever, chills, weight change, fatigue, night sweats HEENT: Denies blurred vision, double vision, hearing loss, tinnitus, sinus congestion, rhinorrhea, sore throat, neck stiffness, dysphagia PULM: Denies shortness of breath, cough, sputum production, hemoptysis, wheezing CV: Denies chest pain, edema, orthopnea, paroxysmal nocturnal dyspnea, palpitations GI: Denies abdominal pain, nausea, vomiting, diarrhea, hematochezia, melena, constipation, change in bowel habits GU: Denies dysuria,  hematuria, polyuria, oliguria, urethral discharge Endocrine: Denies hot or cold intolerance, polyuria, polyphagia or appetite change Derm: Denies rash, dry skin, scaling or peeling skin change Heme: Denies easy bruising, bleeding, bleeding gums Neuro: Denies headache, numbness, weakness, slurred speech, loss of memory or consciousness Past Medical History:  She,  has a past medical history of Arthritis, Breast cancer ([place]), Cancer ([place]) (2004), Diabetes mellitus without complication ([place]), Hypercholesterolemia, Hypertension, Hypothyroid, and Seasonal allergies.   Surgical History:   Past Surgical History:  Procedure Laterality Date  . ABDOMINAL HYSTERECTOMY    . BREAST BIOPSY Left    neg  . BREAST EXCISIONAL BIOPSY Right 2004   breast ca and lumpectomy  . BREAST LUMPECTOMY Right 2004   breast ca  . CHOLECYSTECTOMY     . COLONOSCOPY WITH PROPOFOL N/A 02/13/2017   Procedure: COLONOSCOPY WITH PROPOFOL;  Surgeon: Jonathon Bellows, MD;  Location: [facility] ENDOSCOPY;  Service: Gastroenterology;  Laterality: N/A;  . KIDNEY STONE SURGERY       Social History:   reports that she has quit smoking. Her smoking use included cigarettes. She has never used smokeless tobacco. She reports that she does not drink alcohol and does not use drugs.   Family History:  Her family history is negative for Bladder Cancer and Kidney cancer.   Allergies Allergies  Allergen Reactions  . Navane [Thiothixene] Other (See Comments)    Reaction:  Unknown  . Penicillins Rash    Has patient had a PCN reaction causing immediate rash, facial/tongue/throat swelling, SOB or lightheadedness with hypotension: No Has patient had a PCN reaction causing severe rash involving mucus membranes or skin necrosis: No Has patient had a PCN reaction that required hospitalization: No Has patient had a PCN reaction occurring within the last 10 years: No If all of the above answers are "NO", then may proceed with Cephalosporin use.  Has patient had a PCN reaction causing immediate rash, facial/tongue/throat swelling, SOB or lightheadedness with hypotension: No Has patient had a PCN reaction causing severe rash involving mucus membranes or skin necrosis: No Has patient had a PCN reaction that required hospitalization: No Has patient had a PCN reaction occurring within the last 10 years: No If all of the above answers are "NO", then may proceed with Cephalosporin use. Has patient had a PCN reaction causing immediate rash, facial/tongue/throat swelling, SOB or lightheadedness with hypotension: No Has patient had a PCN reaction causing severe rash involving mucus membranes or skin necrosis: No Has patient had a PCN reaction that required hospitalization: No Has patient had a PCN reaction occurring within the last 10 years: No If all of the above answers are "NO",  then may proceed with Cephalosporin use.     Home Medications  Prior to Admission medications   Medication Sig Start Date End Date Taking? Authorizing Provider  clonazePAM (KLONOPIN) 1 MG tablet Take 1 tablet (1 mg total) by mouth 2 (two) times daily. 08/24/20   Lorella Nimrod, MD  divalproex (DEPAKOTE ER) 500 MG 24 hr tablet Take 2 tablets (1,000 mg total) by mouth at bedtime. 01/11/20   Clapacs, Madie Reno, MD  enalapril (VASOTEC) 5 MG tablet Take 1 tablet (5 mg total) by mouth 2 (two) times daily. 01/11/20   Clapacs, Madie Reno, MD  FLUoxetine (PROZAC) 20 MG capsule Take 60 mg by mouth daily.    [provider]  insulin detemir (LEVEMIR) 100 UNIT/ML injection Inject 0.15 mLs (15 Units total) into the skin daily. 07/31/20   Charlynne Cousins, MD  LATUDA 20 MG TABS tablet Take 20 mg by mouth daily. 08/17/20   [provider]  levothyroxine (SYNTHROID) 88 MCG tablet Take 88 mcg by mouth daily before breakfast.    [provider]  linagliptin (TRADJENTA) 5 MG TABS tablet Take 5 mg by mouth daily.    [provider]  loratadine (CLARITIN) 10 MG tablet Take 1 tablet (10 mg total) by mouth daily. 01/11/20   Clapacs, Madie Reno, MD  metFORMIN (GLUCOPHAGE) 500 MG tablet Take 2 tablets (1,000 mg total) by mouth 2 (two) times daily with a meal. 07/31/20   Charlynne Cousins, MD  ondansetron (ZOFRAN-ODT) 4 MG disintegrating tablet Take 4 mg by mouth every 6 (six) hours as needed for nausea. 08/16/20   [provider]  rosuvastatin (CRESTOR) 40 MG tablet Take 1 tablet (40 mg total) by mouth daily. 01/11/20   Clapacs, Madie Reno, MD     Critical care time: 37 minutes     Venetia Night, AGACNP-BC Acute Care Nurse Practitioner [facility]   [phone] / [phone] Please see Amion for pager details.

## 2020-09-15 NOTE — ED Notes (Signed)
Lab called to collect blood 

## 2020-09-16 ENCOUNTER — Inpatient Hospital Stay: Payer: Medicare Other

## 2020-09-16 ENCOUNTER — Encounter: Payer: Self-pay | Admitting: Internal Medicine

## 2020-09-16 DIAGNOSIS — E039 Hypothyroidism, unspecified: Secondary | ICD-10-CM | POA: Diagnosis not present

## 2020-09-16 DIAGNOSIS — E1169 Type 2 diabetes mellitus with other specified complication: Secondary | ICD-10-CM

## 2020-09-16 DIAGNOSIS — N179 Acute kidney failure, unspecified: Secondary | ICD-10-CM | POA: Diagnosis not present

## 2020-09-16 DIAGNOSIS — J9601 Acute respiratory failure with hypoxia: Secondary | ICD-10-CM | POA: Diagnosis present

## 2020-09-16 DIAGNOSIS — E785 Hyperlipidemia, unspecified: Secondary | ICD-10-CM

## 2020-09-16 LAB — CBC WITH DIFFERENTIAL/PLATELET
Abs Immature Granulocytes: 0.06 10*3/uL (ref 0.00–0.07)
Basophils Absolute: 0.1 10*3/uL (ref 0.0–0.1)
Basophils Relative: 0 %
Eosinophils Absolute: 0 10*3/uL (ref 0.0–0.5)
Eosinophils Relative: 0 %
HCT: 44.2 % (ref 36.0–46.0)
Hemoglobin: 14.8 g/dL (ref 12.0–15.0)
Immature Granulocytes: 0 %
Lymphocytes Relative: 5 %
Lymphs Abs: 0.7 10*3/uL (ref 0.7–4.0)
MCH: 30 pg (ref 26.0–34.0)
MCHC: 33.5 g/dL (ref 30.0–36.0)
MCV: 89.5 fL (ref 80.0–100.0)
Monocytes Absolute: 1.2 10*3/uL — ABNORMAL HIGH (ref 0.1–1.0)
Monocytes Relative: 8 %
Neutro Abs: 13.2 10*3/uL — ABNORMAL HIGH (ref 1.7–7.7)
Neutrophils Relative %: 87 %
Platelets: 246 10*3/uL (ref 150–400)
RBC: 4.94 MIL/uL (ref 3.87–5.11)
RDW: 13.7 % (ref 11.5–15.5)
WBC: 15.2 10*3/uL — ABNORMAL HIGH (ref 4.0–10.5)
nRBC: 0 % (ref 0.0–0.2)

## 2020-09-16 LAB — COMPREHENSIVE METABOLIC PANEL
ALT: 10 U/L (ref 0–44)
ALT: 10 U/L (ref 0–44)
AST: 15 U/L (ref 15–41)
AST: 18 U/L (ref 15–41)
Albumin: 3.7 g/dL (ref 3.5–5.0)
Albumin: 3.3 g/dL — ABNORMAL LOW (ref 3.5–5.0)
Alkaline Phosphatase: 44 U/L (ref 38–126)
Alkaline Phosphatase: 44 U/L (ref 38–126)
Anion gap: 15 (ref 5–15)
Anion gap: 16 — ABNORMAL HIGH (ref 5–15)
BUN: 27 mg/dL — ABNORMAL HIGH (ref 8–23)
BUN: 27 mg/dL — ABNORMAL HIGH (ref 8–23)
CO2: 22 mmol/L (ref 22–32)
CO2: 22 mmol/L (ref 22–32)
Calcium: 9.9 mg/dL (ref 8.9–10.3)
Calcium: 9.5 mg/dL (ref 8.9–10.3)
Chloride: 99 mmol/L (ref 98–111)
Chloride: 98 mmol/L (ref 98–111)
Creatinine, Ser: 1.24 mg/dL — ABNORMAL HIGH (ref 0.44–1.00)
Creatinine, Ser: 1.45 mg/dL — ABNORMAL HIGH (ref 0.44–1.00)
GFR, Estimated: 45 mL/min — ABNORMAL LOW (ref >60)
GFR, Estimated: 38 mL/min — ABNORMAL LOW (ref >60)
Glucose, Bld: 175 mg/dL — ABNORMAL HIGH (ref 70–99)
Glucose, Bld: 268 mg/dL — ABNORMAL HIGH (ref 70–99)
Potassium: 3.7 mmol/L (ref 3.5–5.1)
Potassium: 4.1 mmol/L (ref 3.5–5.1)
Sodium: 136 mmol/L (ref 135–145)
Sodium: 136 mmol/L (ref 135–145)
Total Bilirubin: 0.8 mg/dL (ref 0.3–1.2)
Total Bilirubin: 1.3 mg/dL — ABNORMAL HIGH (ref 0.3–1.2)
Total Protein: 7.4 g/dL (ref 6.5–8.1)
Total Protein: 7.2 g/dL (ref 6.5–8.1)

## 2020-09-16 LAB — BLOOD GAS, ARTERIAL
Acid-Base Excess: 0.9 mmol/L (ref 0.0–2.0)
Allens test (pass/fail): POSITIVE — AB
Bicarbonate: 27.1 mmol/L (ref 20.0–28.0)
FIO2: 1
MECHVT: 450 mL
O2 Saturation: 91.9 %
PEEP: 5 cmH2O
Patient temperature: 37
RATE: 16 resp/min
pCO2 arterial: 48 mmHg (ref 32.0–48.0)
pH, Arterial: 7.36 (ref 7.350–7.450)
pO2, Arterial: 66 mmHg — ABNORMAL LOW (ref 83.0–108.0)

## 2020-09-16 LAB — CBG MONITORING, ED
Glucose-Capillary: 231 mg/dL — ABNORMAL HIGH (ref 70–99)
Glucose-Capillary: 245 mg/dL — ABNORMAL HIGH (ref 70–99)
Glucose-Capillary: 235 mg/dL — ABNORMAL HIGH (ref 70–99)
Glucose-Capillary: 223 mg/dL — ABNORMAL HIGH (ref 70–99)

## 2020-09-16 LAB — GLUCOSE, CAPILLARY
Glucose-Capillary: 246 mg/dL — ABNORMAL HIGH (ref 70–99)
Glucose-Capillary: 239 mg/dL — ABNORMAL HIGH (ref 70–99)
Glucose-Capillary: 186 mg/dL — ABNORMAL HIGH (ref 70–99)

## 2020-09-16 LAB — PHOSPHORUS: Phosphorus: 3.4 mg/dL (ref 2.5–4.6)

## 2020-09-16 LAB — MAGNESIUM: Magnesium: 1.5 mg/dL — ABNORMAL LOW (ref 1.7–2.4)

## 2020-09-16 LAB — PROCALCITONIN: Procalcitonin: 0.84 ng/mL

## 2020-09-16 LAB — MRSA PCR SCREENING: MRSA by PCR: NEGATIVE

## 2020-09-16 IMAGING — DX DG CHEST 1V
1 series · 1 of 1 positions shown · non-contrast
Comparison: Earlier same day

CLINICAL DATA: Status post intubation.

EXAM:
CHEST  1 VIEW

[chest ap]
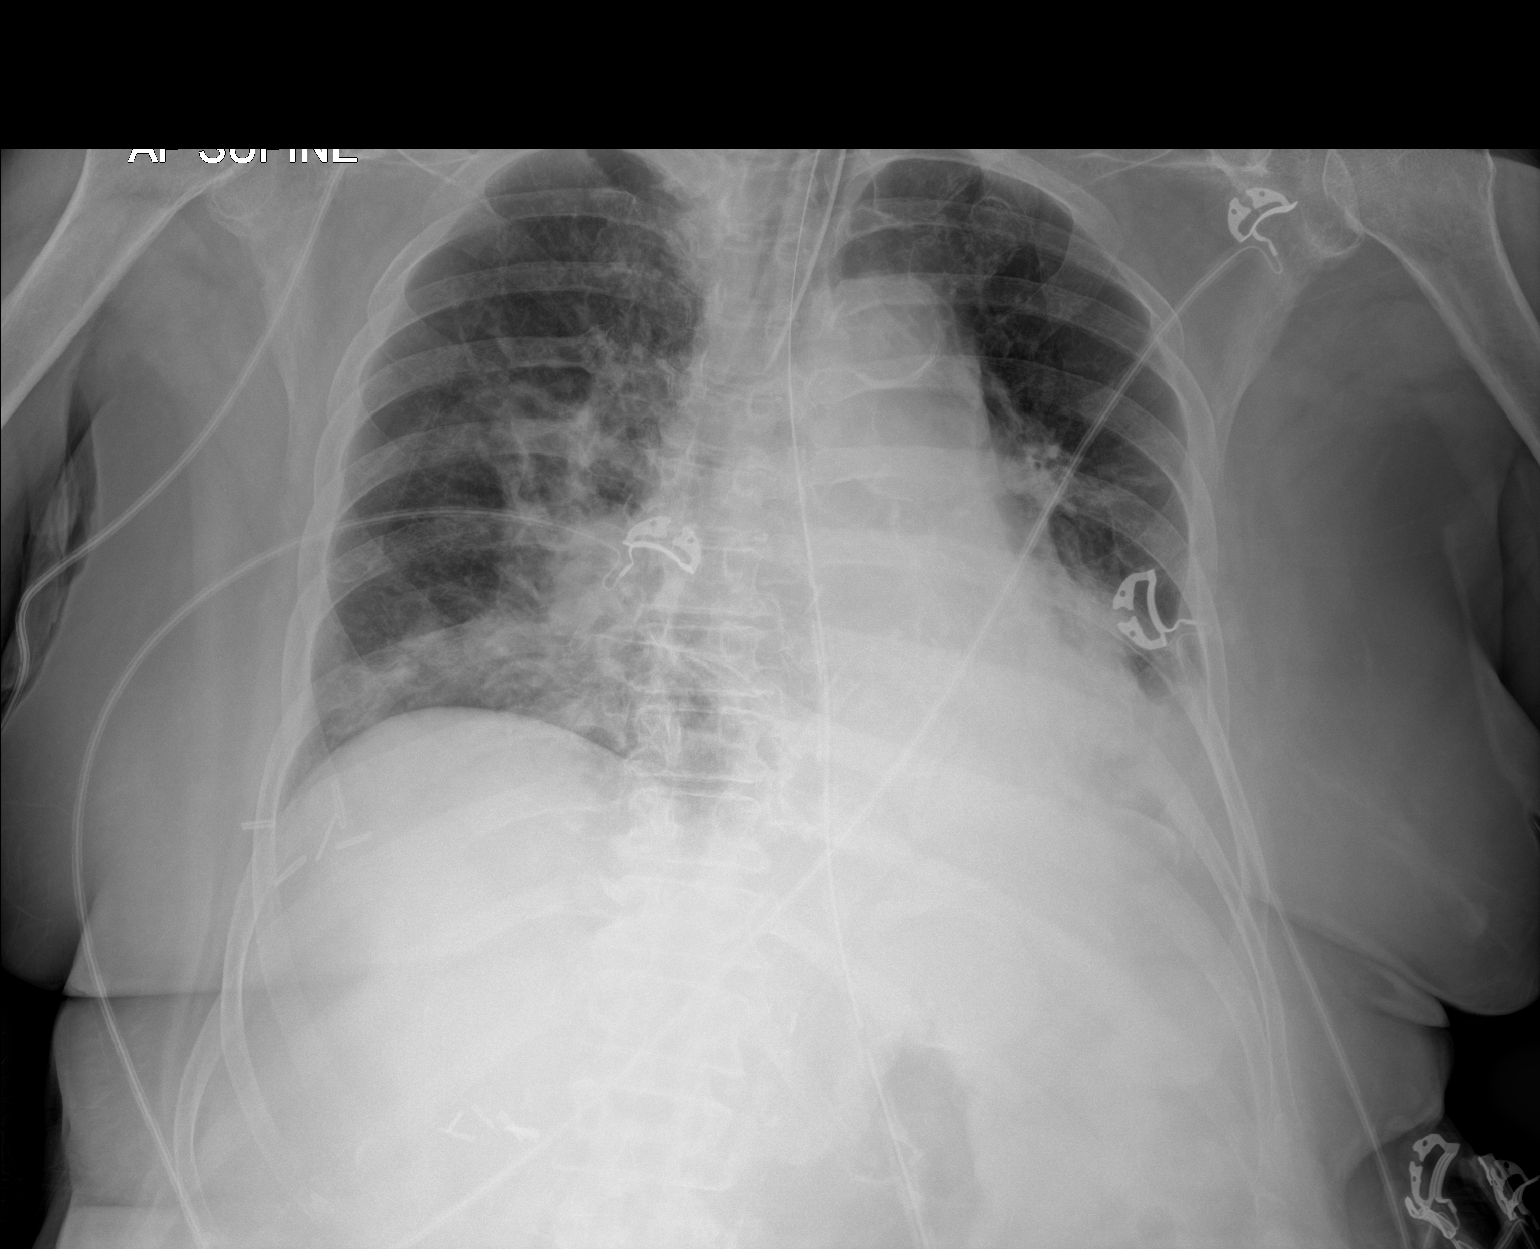

[1 of 1 positions shown; findings below may reference images not displayed]

FINDINGS: [D2] hours. Endotracheal tube tip is 2 cm above the base of the
carina. Interval increase in bibasilar collapse/consolidative
opacity potentially atelectasis although aspiration would be a
consideration. The NG tube passes into the stomach although the
distal tip position is not included on the film. Cardiopericardial
silhouette is at upper limits of normal for size. The visualized
bony structures of the thorax show no acute abnormality. Telemetry
leads overlie the chest.
IMPRESSION: 1. Endotracheal tube tip 2 cm above the base of the carina.
2. Worsening bibasilar collapse/consolidative opacity, potentially
atelectasis although aspiration would be a consideration.

## 2020-09-16 IMAGING — DX DG CHEST 1V PORT
1 series · 1 of 1 positions shown · non-contrast
Comparison: Prior radiograph from [DATE].

CLINICAL DATA: Initial evaluation for acute shortness of breath.

EXAM:
PORTABLE CHEST 1 VIEW

[chest ap]
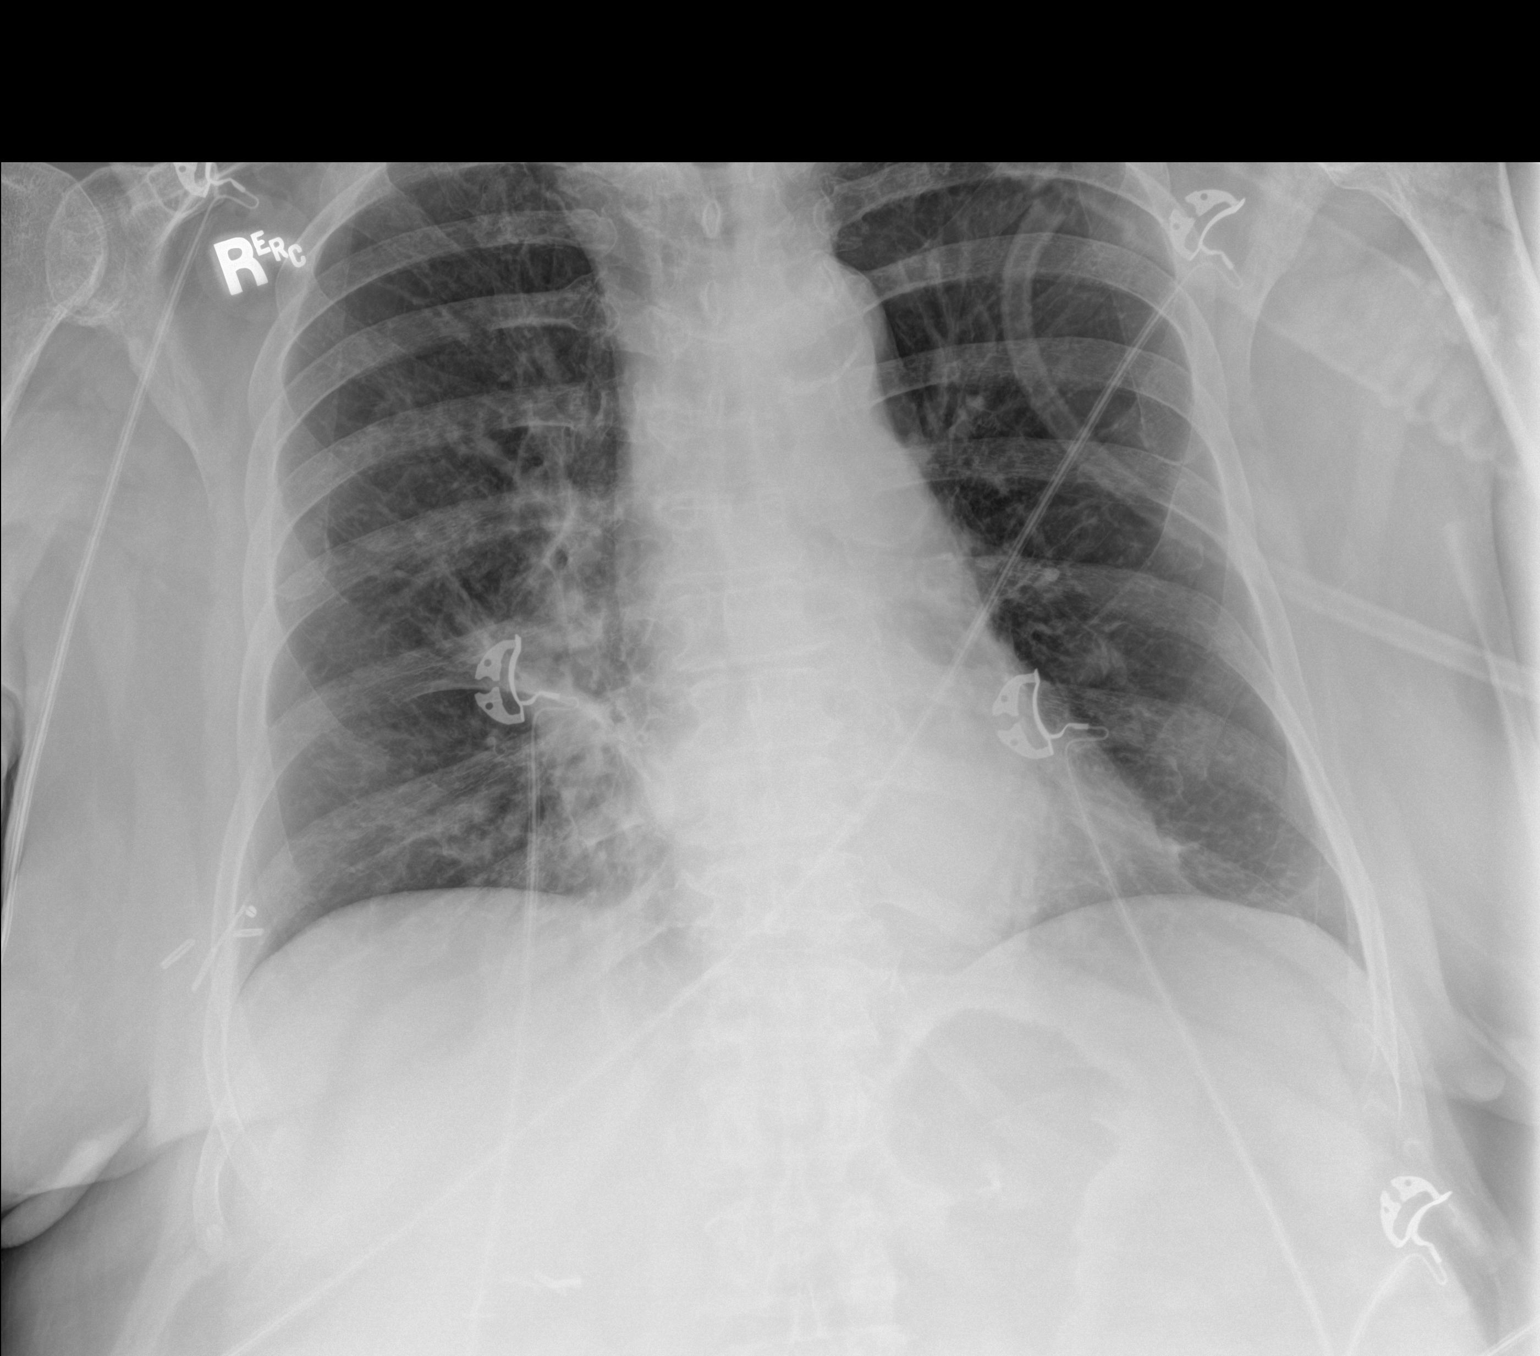

[1 of 1 positions shown; findings below may reference images not displayed]

FINDINGS: Transverse heart size stable, and remains within normal limits.
Mediastinal silhouette normal. Aortic atherosclerosis.

There is increased patchy density within the right infrahilar
region, which could reflect atelectasis and/or infiltrate. Mild
scarring at the left lung base is stable. No pulmonary edema or
pleural effusion. No pneumothorax.

Surgical clips overlie the right lower chest. Osseous structures are
unchanged.
IMPRESSION: 1. Patchy right infrahilar opacity, which could reflect atelectasis
and/or infiltrate.
2.  Aortic Atherosclerosis ([OR]-[OR]).

## 2020-09-16 IMAGING — DX DG ABDOMEN 1V
1 series · 1 of 1 positions shown · non-contrast
Comparison: [DATE]

CLINICAL DATA: OG tube placement

EXAM:
ABDOMEN - 1 VIEW

[abdomen supine]
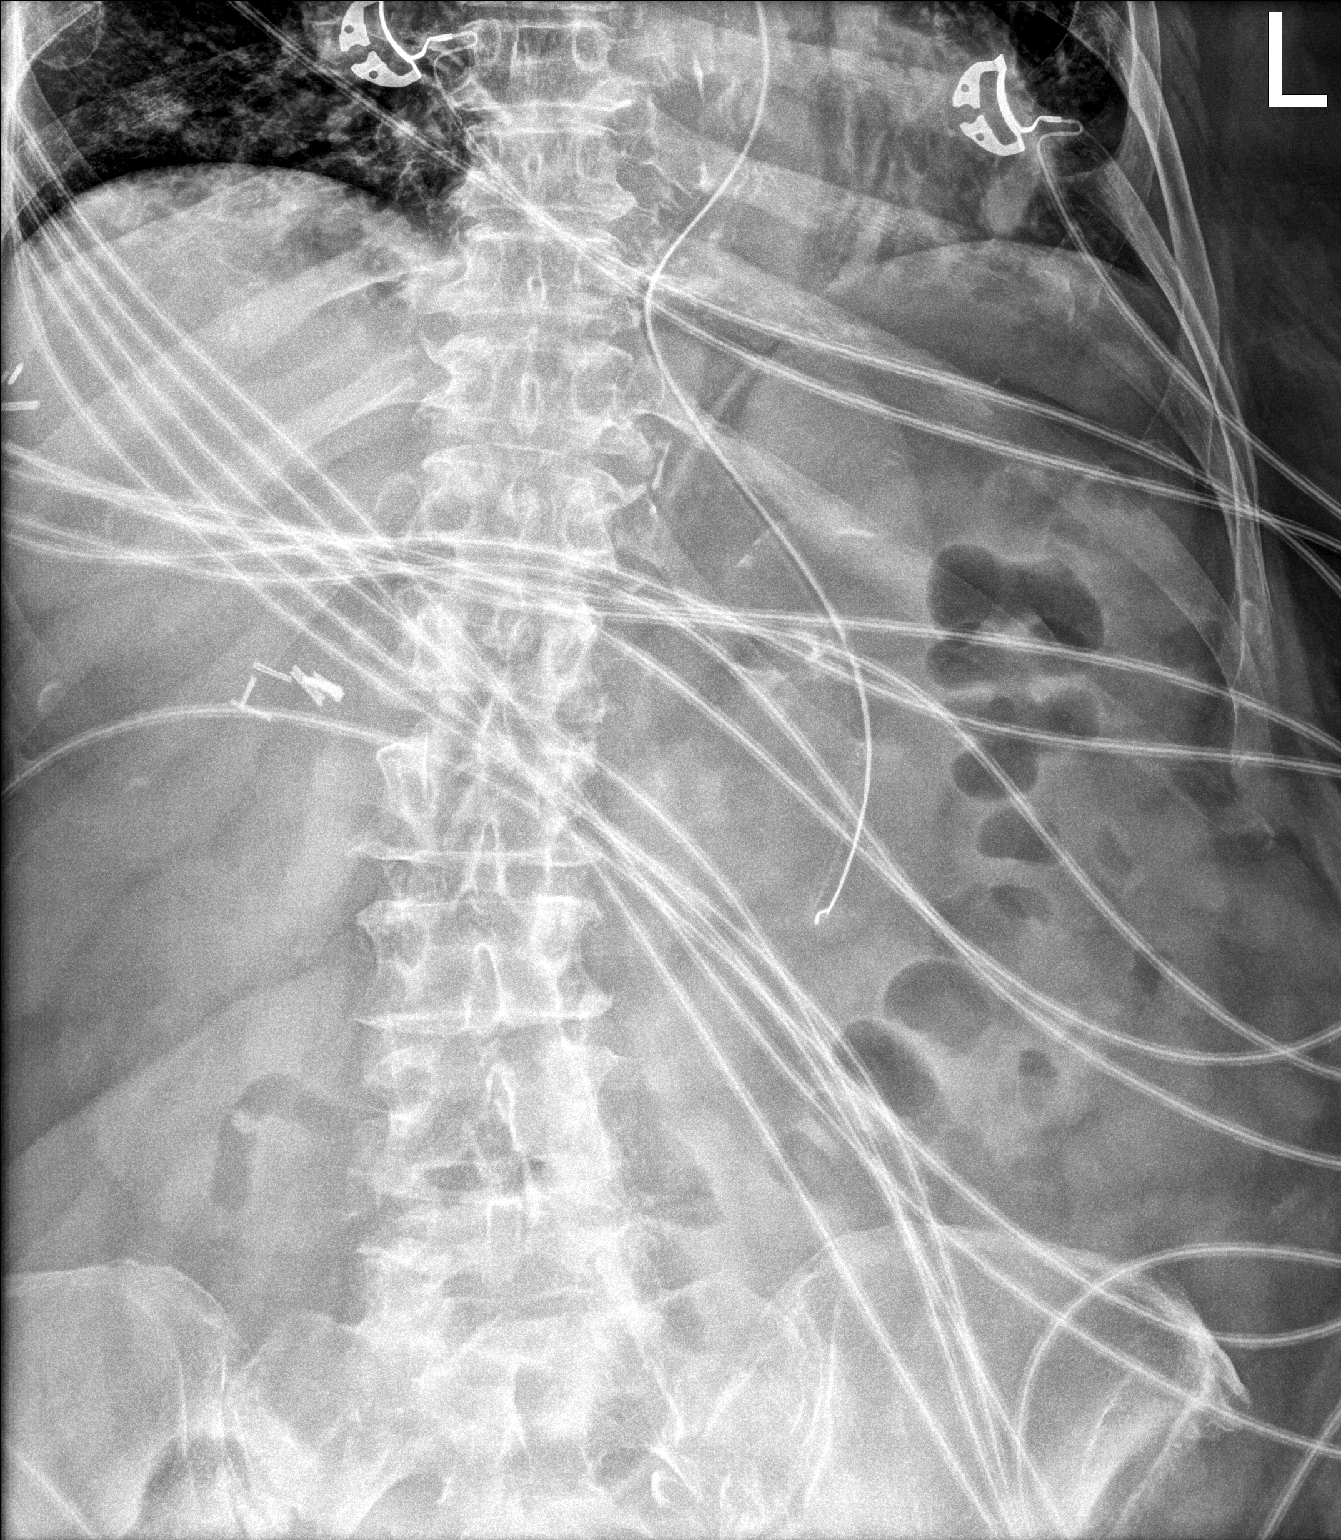

[1 of 1 positions shown; findings below may reference images not displayed]

FINDINGS: The OG tube terminates in the stomach.
IMPRESSION: The OG tube terminates in the stomach.

## 2020-09-16 MED ORDER — FENTANYL BOLUS VIA INFUSION
25.0000 ug | INTRAVENOUS | Status: DC | PRN
  Administered 2020-09-16 – 2020-09-17 (×2): 25 ug via INTRAVENOUS
  Filled 2020-09-16: qty 25

## 2020-09-16 MED ORDER — LACTATED RINGERS IV SOLN: INTRAVENOUS | Status: AC

## 2020-09-16 MED ORDER — ROCURONIUM BROMIDE 50 MG/5ML IV SOLN
50.0000 mg | Freq: Once | INTRAVENOUS | Status: AC
  Administered 2020-09-16: 50 mg via INTRAVENOUS
  Filled 2020-09-16: qty 5

## 2020-09-16 MED ORDER — ALBUTEROL SULFATE (2.5 MG/3ML) 0.083% IN NEBU
2.5000 mg | INHALATION_SOLUTION | Freq: Four times a day (QID) | RESPIRATORY_TRACT | Status: DC
  Administered 2020-09-16 – 2020-09-18 (×8): 2.5 mg via RESPIRATORY_TRACT
  Filled 2020-09-16 (×9): qty 3

## 2020-09-16 MED ORDER — CHLORHEXIDINE GLUCONATE CLOTH 2 % EX PADS
6.0000 | MEDICATED_PAD | Freq: Every day | CUTANEOUS | Status: DC
  Administered 2020-09-16 – 2020-09-29 (×14): 6 via TOPICAL

## 2020-09-16 MED ORDER — SODIUM CHLORIDE 0.9 % IV SOLN
250.0000 mL | INTRAVENOUS | Status: DC
  Administered 2020-09-16 – 2020-09-19 (×2): 250 mL via INTRAVENOUS

## 2020-09-16 MED ORDER — CLONAZEPAM 0.5 MG PO TABS
0.5000 mg | ORAL_TABLET | Freq: Two times a day (BID) | ORAL | Status: DC
  Administered 2020-09-16: 0.5 mg via ORAL
  Filled 2020-09-16: qty 1

## 2020-09-16 MED ORDER — LACTATED RINGERS IV SOLN: INTRAVENOUS | Status: DC

## 2020-09-16 MED ORDER — INSULIN ASPART 100 UNIT/ML IJ SOLN
0.0000 [IU] | INTRAMUSCULAR | Status: DC
  Administered 2020-09-16 – 2020-09-17 (×6): 3 [IU] via SUBCUTANEOUS
  Administered 2020-09-17 – 2020-09-18 (×3): 2 [IU] via SUBCUTANEOUS
  Administered 2020-09-18: 3 [IU] via SUBCUTANEOUS
  Administered 2020-09-19 (×2): 2 [IU] via SUBCUTANEOUS
  Administered 2020-09-19: 3 [IU] via SUBCUTANEOUS
  Administered 2020-09-19: 2 [IU] via SUBCUTANEOUS
  Administered 2020-09-20: 3 [IU] via SUBCUTANEOUS
  Administered 2020-09-20 (×2): 2 [IU] via SUBCUTANEOUS
  Administered 2020-09-21: 5 [IU] via SUBCUTANEOUS
  Administered 2020-09-21 (×2): 3 [IU] via SUBCUTANEOUS
  Administered 2020-09-21: 5 [IU] via SUBCUTANEOUS
  Administered 2020-09-22: 3 [IU] via SUBCUTANEOUS
  Administered 2020-09-22: 2 [IU] via SUBCUTANEOUS
  Administered 2020-09-22: 8 [IU] via SUBCUTANEOUS
  Administered 2020-09-23 (×2): 5 [IU] via SUBCUTANEOUS
  Administered 2020-09-23 – 2020-09-24 (×2): 8 [IU] via SUBCUTANEOUS
  Administered 2020-09-24: 3 [IU] via SUBCUTANEOUS
  Administered 2020-09-24: 2 [IU] via SUBCUTANEOUS
  Administered 2020-09-24: 5 [IU] via SUBCUTANEOUS
  Administered 2020-09-25: 2 [IU] via SUBCUTANEOUS
  Administered 2020-09-25: 3 [IU] via SUBCUTANEOUS
  Administered 2020-09-25: 8 [IU] via SUBCUTANEOUS
  Administered 2020-09-25 – 2020-09-26 (×3): 3 [IU] via SUBCUTANEOUS
  Administered 2020-09-26: 5 [IU] via SUBCUTANEOUS
  Administered 2020-09-27: 8 [IU] via SUBCUTANEOUS
  Administered 2020-09-27 (×2): 3 [IU] via SUBCUTANEOUS
  Administered 2020-09-27: 5 [IU] via SUBCUTANEOUS
  Administered 2020-09-27: 2 [IU] via SUBCUTANEOUS
  Administered 2020-09-28: 3 [IU] via SUBCUTANEOUS
  Administered 2020-09-28 (×3): 2 [IU] via SUBCUTANEOUS
  Administered 2020-09-29: 5 [IU] via SUBCUTANEOUS
  Administered 2020-09-29: 3 [IU] via SUBCUTANEOUS
  Administered 2020-09-29: 2 [IU] via SUBCUTANEOUS
  Administered 2020-09-29: 3 [IU] via SUBCUTANEOUS
  Administered 2020-09-30 (×2): 2 [IU] via SUBCUTANEOUS
  Administered 2020-09-30: 5 [IU] via SUBCUTANEOUS
  Administered 2020-09-30 – 2020-10-01 (×2): 3 [IU] via SUBCUTANEOUS
  Administered 2020-10-01 (×2): 2 [IU] via SUBCUTANEOUS
  Administered 2020-10-01: 3 [IU] via SUBCUTANEOUS
  Administered 2020-10-01: 8 [IU] via SUBCUTANEOUS
  Administered 2020-10-02 (×2): 2 [IU] via SUBCUTANEOUS
  Filled 2020-09-16 (×63): qty 1

## 2020-09-16 MED ORDER — MAGNESIUM SULFATE 2 GM/50ML IV SOLN
2.0000 g | Freq: Once | INTRAVENOUS | Status: AC
  Administered 2020-09-16: 2 g via INTRAVENOUS
  Filled 2020-09-16: qty 50

## 2020-09-16 MED ORDER — DOCUSATE SODIUM 50 MG/5ML PO LIQD
100.0000 mg | Freq: Two times a day (BID) | ORAL | Status: DC
  Administered 2020-09-16 – 2020-09-17 (×3): 100 mg
  Filled 2020-09-16 (×4): qty 10

## 2020-09-16 MED ORDER — SODIUM CHLORIDE 0.9 % IV SOLN
2.0000 g | INTRAVENOUS | Status: AC
  Administered 2020-09-16 – 2020-09-20 (×5): 2 g via INTRAVENOUS
  Filled 2020-09-16: qty 20
  Filled 2020-09-16: qty 2
  Filled 2020-09-16: qty 20
  Filled 2020-09-16 (×2): qty 2
  Filled 2020-09-16: qty 20

## 2020-09-16 MED ORDER — INSULIN ASPART 100 UNIT/ML IJ SOLN
0.0000 [IU] | INTRAMUSCULAR | Status: DC
  Administered 2020-09-16 (×4): 3 [IU] via SUBCUTANEOUS
  Filled 2020-09-16 (×4): qty 1

## 2020-09-16 MED ORDER — FENTANYL CITRATE (PF) 100 MCG/2ML IJ SOLN
25.0000 ug | Freq: Once | INTRAMUSCULAR | Status: AC
Start: 2020-09-16 — End: 2020-09-16
  Administered 2020-09-16: 25 ug via INTRAVENOUS
  Filled 2020-09-16: qty 2

## 2020-09-16 MED ORDER — SODIUM CHLORIDE 0.9 % IV SOLN: 1.0000 g | Freq: Two times a day (BID) | INTRAVENOUS | Status: DC

## 2020-09-16 MED ORDER — LURASIDONE HCL 20 MG PO TABS
20.0000 mg | ORAL_TABLET | Freq: Every day | ORAL | Status: DC
  Administered 2020-09-17: 20 mg
  Filled 2020-09-16: qty 1

## 2020-09-16 MED ORDER — FENTANYL CITRATE (PF) 100 MCG/2ML IJ SOLN
100.0000 ug | Freq: Once | INTRAMUSCULAR | Status: AC
  Administered 2020-09-16: 100 ug via INTRAVENOUS

## 2020-09-16 MED ORDER — CLONAZEPAM 0.5 MG PO TABS
0.5000 mg | ORAL_TABLET | Freq: Two times a day (BID) | ORAL | Status: DC
  Administered 2020-09-17: 0.5 mg
  Filled 2020-09-16: qty 1

## 2020-09-16 MED ORDER — LACTATED RINGERS IV BOLUS
1000.0000 mL | Freq: Once | INTRAVENOUS | Status: AC
  Administered 2020-09-16: 1000 mL via INTRAVENOUS

## 2020-09-16 MED ORDER — FLUOXETINE HCL 20 MG PO CAPS
60.0000 mg | ORAL_CAPSULE | Freq: Every day | ORAL | Status: DC
  Administered 2020-09-17 – 2020-10-02 (×15): 60 mg via ORAL
  Filled 2020-09-16 (×16): qty 3

## 2020-09-16 MED ORDER — NOREPINEPHRINE 4 MG/250ML-% IV SOLN
2.0000 ug/min | INTRAVENOUS | Status: DC
  Administered 2020-09-17: 2 ug/min via INTRAVENOUS
  Filled 2020-09-16: qty 250

## 2020-09-16 MED ORDER — LURASIDONE HCL 20 MG PO TABS
20.0000 mg | ORAL_TABLET | Freq: Every day | ORAL | Status: DC
  Filled 2020-09-16 (×2): qty 1

## 2020-09-16 MED ORDER — POLYETHYLENE GLYCOL 3350 17 G PO PACK
17.0000 g | PACK | Freq: Every day | ORAL | Status: DC
  Administered 2020-09-17: 17 g
  Filled 2020-09-16: qty 1

## 2020-09-16 MED ORDER — LEVOTHYROXINE SODIUM 88 MCG PO TABS
88.0000 ug | ORAL_TABLET | Freq: Every day | ORAL | Status: DC
  Administered 2020-09-17: 88 ug
  Filled 2020-09-16 (×2): qty 1

## 2020-09-16 MED ORDER — FENTANYL 2500MCG IN NS 250ML (10MCG/ML) PREMIX INFUSION
25.0000 ug/h | INTRAVENOUS | Status: DC
  Administered 2020-09-16 (×2): 25 ug/h via INTRAVENOUS
  Filled 2020-09-16: qty 250

## 2020-09-16 MED ORDER — DIVALPROEX SODIUM ER 500 MG PO TB24
1000.0000 mg | ORAL_TABLET | Freq: Every day | ORAL | Status: DC
  Filled 2020-09-16 (×2): qty 2

## 2020-09-16 MED ORDER — METRONIDAZOLE 500 MG/100ML IV SOLN
500.0000 mg | Freq: Three times a day (TID) | INTRAVENOUS | Status: AC
  Administered 2020-09-16 – 2020-09-21 (×15): 500 mg via INTRAVENOUS
  Filled 2020-09-16 (×16): qty 100

## 2020-09-16 MED ORDER — LEVOTHYROXINE SODIUM 88 MCG PO TABS
88.0000 ug | ORAL_TABLET | Freq: Every day | ORAL | Status: DC
  Filled 2020-09-16 (×2): qty 1

## 2020-09-16 NOTE — ED Notes (Signed)
Md at bedside for intubation.

## 2020-09-16 NOTE — Progress Notes (Signed)
Patient ID: [name], female   DOB: 1945-07-27, 75 y.o.   MRN: [id number] [facility] PROGRESS NOTE  Wendie Diskin SWN:[id number] DOB: [birth year] DOA: 09/15/2020 PCP: Mechele Claude, FNP  HPI/Subjective: Patient seen early this morning and patient was on BiPAP.  Patient had a aspiration event on a hotdog and had worsening respiratory status.  She was on 50% BiPAP this morning.  I changed her status to stepdown and spoke with critical care specialist..  Later this morning she was on 100% BiPAP and required intubation for bronchoscopy today.  Patient states that she has been short of breath going on for over a year.  Objective: Vitals:   09/16/20 1035 09/16/20 1153  BP: 97/62   Pulse: 86   Resp: 18   Temp:    SpO2: 100% 100%    Intake/Output Summary (Last 24 hours) at 09/16/2020 1302 Last data filed at 09/16/2020 0906 Gross per 24 hour  Intake 50 ml  Output --  Net 50 ml   Filed Weights   09/15/20 2043  Weight: 81.6 kg    ROS: Review of Systems  Respiratory: Positive for cough and shortness of breath.   Cardiovascular: Negative for chest pain.  Gastrointestinal: Negative for abdominal pain, nausea and vomiting.   Exam: Physical Exam HENT:     Head: Normocephalic.     Mouth/Throat:     Pharynx: No oropharyngeal exudate.  Eyes:     General: Lids are normal.     Conjunctiva/sclera: Conjunctivae normal.  Cardiovascular:     Rate and Rhythm: Normal rate and regular rhythm.     Heart sounds: Normal heart sounds, S1 normal and S2 normal.  Pulmonary:     Breath sounds: Examination of the right-middle field reveals decreased breath sounds. Examination of the right-lower field reveals decreased breath sounds and rhonchi. Examination of the left-lower field reveals decreased breath sounds. Decreased breath sounds and rhonchi present. No wheezing or rales.  Abdominal:     Palpations: Abdomen is soft.     Tenderness: There is no abdominal tenderness.   Musculoskeletal:     Right lower leg: No swelling.     Left lower leg: No swelling.  Neurological:     Mental Status: She is alert.     Comments: Answers some questions.       Data Reviewed: Basic Metabolic Panel: Recent Labs  Lab 09/15/20 2151 09/15/20 2259 09/16/20 0608  NA 135 136 136  K 3.7 3.7 4.1  CL 98 99 98  CO2 23 22 22   GLUCOSE 193* 175* 268*  BUN 27* 27* 27*  CREATININE 1.28* 1.24* 1.45*  CALCIUM 9.8 9.9 9.5  MG  --   --  1.5*  PHOS  --   --  3.4   Liver Function Tests: Recent Labs  Lab 09/15/20 2151 09/15/20 2259 09/16/20 0608  AST 16 15 18   ALT 10 10 10   ALKPHOS 44 44 44  BILITOT 0.8 0.8 1.3*  PROT 7.5 7.4 7.2  ALBUMIN 3.6 3.7 3.3*   CBC: Recent Labs  Lab 09/15/20 2052 09/16/20 0608  WBC 15.5* 15.2*  NEUTROABS  --  13.2*  HGB 14.7 14.8  HCT 43.6 44.2  MCV 88.6 89.5  PLT 293 246   BNP (last 3 results) Recent Labs    03/01/20 1312  BNP 82.3    CBG: Recent Labs  Lab 09/16/20 0341 09/16/20 0449 09/16/20 0738 09/16/20 1251  GLUCAP 231* 245* 235* 223*    Recent Results (from the  past 240 hour(s))  Resp Panel by RT-PCR (Flu A&B, Covid) Nasopharyngeal Swab     Status: None   Collection Time: 09/15/20  8:52 PM   Specimen: Nasopharyngeal Swab; Nasopharyngeal(NP) swabs in vial transport medium  Result Value Ref Range Status   SARS Coronavirus 2 by RT PCR NEGATIVE NEGATIVE Final    Comment: (NOTE) SARS-CoV-2 target nucleic acids are NOT DETECTED.  The SARS-CoV-2 RNA is generally detectable in upper respiratory specimens during the acute phase of infection. The lowest concentration of SARS-CoV-2 viral copies this assay can detect is 138 copies/mL. A negative result does not preclude SARS-Cov-2 infection and should not be used as the sole basis for treatment or other patient management decisions. A negative result may occur with  improper specimen collection/handling, submission of specimen other than nasopharyngeal swab,  presence of viral mutation(s) within the areas targeted by this assay, and inadequate number of viral copies(<138 copies/mL). A negative result must be combined with clinical observations, patient history, and epidemiological information. The expected result is Negative.  Fact Sheet for Patients:  EntrepreneurPulse.com.au  Fact Sheet for Healthcare Providers:  IncredibleEmployment.be  This test is no t yet approved or cleared by the [place] FDA and  has been authorized for detection and/or diagnosis of SARS-CoV-2 by FDA under an Emergency Use Authorization (EUA). This EUA will remain  in effect (meaning this test can be used) for the duration of the COVID-19 declaration under Section 564(b)(1) of the Act, 21 U.S.C.section 360bbb-3(b)(1), unless the authorization is terminated  or revoked sooner.       Influenza A by PCR NEGATIVE NEGATIVE Final   Influenza B by PCR NEGATIVE NEGATIVE Final    Comment: (NOTE) The Xpert Xpress SARS-CoV-2/FLU/RSV plus assay is intended as an aid in the diagnosis of influenza from Nasopharyngeal swab specimens and should not be used as a sole basis for treatment. Nasal washings and aspirates are unacceptable for Xpert Xpress SARS-CoV-2/FLU/RSV testing.  Fact Sheet for Patients: EntrepreneurPulse.com.au  Fact Sheet for Healthcare Providers: IncredibleEmployment.be  This test is not yet approved or cleared by the [place] FDA and has been authorized for detection and/or diagnosis of SARS-CoV-2 by FDA under an Emergency Use Authorization (EUA). This EUA will remain in effect (meaning this test can be used) for the duration of the COVID-19 declaration under Section 564(b)(1) of the Act, 21 U.S.C. section 360bbb-3(b)(1), unless the authorization is terminated or revoked.  Performed at [facility], [address] [place],  [address]       Studies: DG Chest 1 View  Result Date: 09/16/2020 CLINICAL DATA:  Status post intubation. EXAM: CHEST  1 VIEW COMPARISON:  Earlier same day FINDINGS: 1008 hours. Endotracheal tube tip is 2 cm above the base of the carina. Interval increase in bibasilar collapse/consolidative opacity potentially atelectasis although aspiration would be a consideration. The NG tube passes into the stomach although the distal tip position is not included on the film. Cardiopericardial silhouette is at upper limits of normal for size. The visualized bony structures of the thorax show no acute abnormality. Telemetry leads overlie the chest. IMPRESSION: 1. Endotracheal tube tip 2 cm above the base of the carina. 2. Worsening bibasilar collapse/consolidative opacity, potentially atelectasis although aspiration would be a consideration. Electronically Signed   By: Misty Stanley M.D.   On: 09/16/2020 10:46   DG Chest Port 1 View  Result Date: 09/16/2020 CLINICAL DATA:  Initial evaluation for acute shortness of breath. EXAM: PORTABLE CHEST 1 VIEW COMPARISON:  Prior  radiograph from 09/15/2020. FINDINGS: Transverse heart size stable, and remains within normal limits. Mediastinal silhouette normal. Aortic atherosclerosis. There is increased patchy density within the right infrahilar region, which could reflect atelectasis and/or infiltrate. Mild scarring at the left lung base is stable. No pulmonary edema or pleural effusion. No pneumothorax. Surgical clips overlie the right lower chest. Osseous structures are unchanged. IMPRESSION: 1. Patchy right infrahilar opacity, which could reflect atelectasis and/or infiltrate. 2.  Aortic Atherosclerosis (ICD10-I70.0). Electronically Signed   By: Jeannine Boga M.D.   On: 09/16/2020 05:54   DG Chest Portable 1 View  Result Date: 09/15/2020 CLINICAL DATA:  Shortness of breath. EXAM: PORTABLE CHEST 1 VIEW COMPARISON:  August 21, 2020 FINDINGS: The lungs are hyperinflated. Mild, stable  scarring is seen within the left lung base. There is no evidence of a pleural effusion or pneumothorax. The heart size and mediastinal contours are within normal limits. Tortuosity and calcification of the descending thoracic aorta is seen. Radiopaque surgical clips are seen overlying the soft tissues of the right breast. Degenerative changes are noted throughout the thoracic spine. IMPRESSION: Stable left basilar scarring without acute or active cardiopulmonary disease. Electronically Signed   By: Virgina Norfolk M.D.   On: 09/15/2020 21:33    Scheduled Meds: . albuterol  2.5 mg Nebulization Q6H  . clonazePAM  0.5 mg Oral BID  . divalproex  1,000 mg Oral QHS  . docusate  100 mg Per Tube BID  . FLUoxetine  60 mg Oral Daily  . insulin aspart  0-9 Units Subcutaneous Q4H  . levothyroxine  88 mcg Oral QAC breakfast  . lurasidone  20 mg Oral Daily  . polyethylene glycol  17 g Per Tube Daily   Continuous Infusions: . cefTRIAXone (ROCEPHIN)  IV    . fentaNYL infusion INTRAVENOUS 50 mcg/hr (09/16/20 1149)  . metronidazole      Assessment/Plan:  1. Acute hypoxic respiratory failure secondary to aspiration.  Requiring BiPAP 50% oxygen this morning.  Had worsening oxygen requirements up to 100% and required intubation.  Critical care specialist did a bronchoscopy and remove some material.  Empiric antibiotics. 2. Hypothyroidism unspecified.  Continue we will thyroxine 3. Type 2 diabetes mellitus with hyperlipidemia holding cholesterol medication and on sliding scale insulin. 4. Continue patient psychiatric medication. 5. Acute kidney injury.  Patient's baseline creatinine 1.03.  Today's creatinine 1.45.  Continue IV fluids 6. Hypomagnesemia replace magnesium IV     Code Status:     Code Status Orders  (From admission, onward)         Start     Ordered   09/15/20 2241  Full code  Continuous        09/15/20 2241        Code Status History    Date Active Date Inactive Code Status  Order ID Comments User Context   08/22/2020 0250 08/24/2020 1656 Full Code [id number]  Athena Masse, MD ED   07/24/2020 1148 08/02/2020 1622 Full Code [id number]  Collier Bullock, MD ED   01/07/2020 2216 01/11/2020 1627 Full Code [id number]  Eulas Post, MD Inpatient   01/07/2020 1309 01/07/2020 2214 Full Code [id number]  Carrie Mew, MD ED   08/10/2019 2148 08/13/2019 1947 Full Code [id number]  Dixie Dials, MD Inpatient   06/24/2019 2219 06/29/2019 1932 Full Code [id number]  Caroline Sauger, NP Inpatient   07/21/2018 1258 07/27/2018 1734 Full Code [id number]  Lavella Hammock, MD Inpatient   07/21/2018 1258 07/21/2018 1258 Full Code [id number]  Melba Coon  M, MD Inpatient   02/02/2018 1857 02/09/2018 1914 Full Code [id number]  Gonzella Lex, MD Inpatient   07/07/2017 1633 07/10/2017 2004 Full Code [id number]  Gonzella Lex, MD Inpatient   09/20/2014 2036 09/23/2014 1901 Full Code [id number]  Clapacs, Madie Reno, MD Inpatient   Advance Care Planning Activity     Family Communication: Spoke with contact in the computer Disposition Plan: Status is: Inpatient  Dispo: The patient is from: Rehab as per contact in the computer              Anticipated d/c is to: Home              Patient currently being treated for aspiration event requiring bronchoscopy   Difficult to place patient.  No.  Consultants:  Pulmonary  Procedures:  Bronchoscopy  Antibiotics:  Rocephin and Flagyl  Time spent: 28 minutes, case discussed with critical care team  [employer]  [facility]

## 2020-09-16 NOTE — Progress Notes (Signed)
PHARMACY NOTE:  ANTIMICROBIAL RENAL DOSAGE ADJUSTMENT  Current antimicrobial regimen includes a mismatch between antimicrobial dosage and estimated renal function.  As per policy approved by the Pharmacy & Therapeutics and Medical Executive Committees, the antimicrobial dosage will be adjusted accordingly.  Current antimicrobial dosage:  Meropenem 1 g IV q8h  Indication: Aspiration PNA  Renal Function:  Estimated Creatinine Clearance: 33.9 mL/min (A) (by C-G formula based on SCr of 1.45 mg/dL (H)).  Antimicrobial dosage has been changed to:  Meropenem 1 g IV q12h  Thank you for allowing pharmacy to be a part of this patient's care.  Benita Gutter, [facility] 09/16/2020 7:24 AM

## 2020-09-16 NOTE — Procedures (Signed)
PROCEDURE: BRONCHOSCOPY  Therapeutic Aspiration of Tracheobronchial Tree  Aspiration of foreign body from airway   PROCEDURE DATE: 09/16/2020  TIME:  NAME:  [name]  DOB:24-Mar-1946  MRN: [id number] LOC:  ED15A/ED15A    HOSP DAY: _0 @ CODE STATUS:      Code Status Orders  (From admission, onward)         Start     Ordered   09/15/20 2241  Full code  Continuous        09/15/20 2241        Code Status History    Date Active Date Inactive Code Status Order ID Comments User Context   08/22/2020 0250 08/24/2020 1656 Full Code [id number]  Athena Masse, MD ED   07/24/2020 1148 08/02/2020 1622 Full Code [id number]  Collier Bullock, MD ED   01/07/2020 2216 01/11/2020 1627 Full Code [id number]  Eulas Post, MD Inpatient   01/07/2020 1309 01/07/2020 2214 Full Code [id number]  Carrie Mew, MD ED   08/10/2019 2148 08/13/2019 1947 Full Code [id number]  Dixie Dials, MD Inpatient   06/24/2019 2219 06/29/2019 1932 Full Code [id number]  Caroline Sauger, NP Inpatient   07/21/2018 1258 07/27/2018 1734 Full Code [id number]  Lavella Hammock, MD Inpatient   07/21/2018 1258 07/21/2018 1258 Full Code [id number]  Lavella Hammock, MD Inpatient   02/02/2018 1857 02/09/2018 1914 Full Code [id number]  Gonzella Lex, MD Inpatient   07/07/2017 1633 07/10/2017 2004 Full Code [id number]  Gonzella Lex, MD Inpatient   09/20/2014 2036 09/23/2014 1901 Full Code [id number]  Gonzella Lex, MD Inpatient   Advance Care Planning Activity        Media Information         Document Information  Photos    09/16/2020 10:23  Attached To:  Hospital Encounter on 09/15/20   Source Information  Ottie Glazier, MD  [facility]      Indications/Preliminary Diagnosis:   Consent: (Place X beside choice/s below)  The benefits, risks and possible complications of the procedure were        explained to:  ___ patient  ___ patient's family  _x__ other:_____Emergent -  discussed with [facility] - Ms. Del Mae______  who verbalized understanding and gave:  ___ verbal  ___ written  ___ verbal and written  ___ telephone  ___ other:________ consent.      Unable to obtain consent; procedure performed on emergent basis.     Other:       PRESEDATION ASSESSMENT: History and Physical has been performed. Patient meds and allergies have been reviewed. Presedation airway examination has been performed and documented. Baseline vital signs, sedation score, oxygenation status, and cardiac rhythm were reviewed. Patient was deemed to be in satisfactory condition to undergo the procedure.    PREMEDICATIONS:   Sedative/Narcotic Amt Dose   Versed  mg  x Fentanyl gtt 68mg  Diprivan  mg            PROCEDURE DETAILS: Timeout performed and correct patient, name, & ID confirmed. Following prep per Pulmonary policy, appropriate sedation was administered. The Bronchoscope was inserted in to oral cavity with bite block in place. Therapeutic aspiration of Tracheobronchial tree was performed.  Airway exam proceeded with findings, technical procedures, and specimen collection as noted below. At the end of exam the scope was withdrawn without incident. Impression and Plan as noted below.           Airway Prep (Place X beside choice below)   1% Transtracheal  Lidocaine Anesthetization 7 cc   Patient prepped per Bronchoscopy Lab Policy       Insertion Route (Place X beside choice below)   Nasal   Oral  x Endotracheal Tube   Tracheostomy   INTRAPROCEDURE MEDICATIONS:  Sedative/Narcotic Amt Dose   Versed  mg  x Fentanyl gtt 83mg  Diprivan  mg       Medication Amt Dose  Medication Amt Dose  Lidocaine 1%  cc  Epinephrine 1:10,000 sol  cc  Xylocaine 4%  cc  Cocaine  cc   TECHNICAL PROCEDURES: (Place X beside choice below)   Procedures  Description    None     Electrocautery     Cryotherapy     Balloon Dilatation     Bronchography     Stent Placement      Therapeutic Aspiration x    Laser/Argon Plasma    Brachytherapy Catheter Placement    Foreign Body Removal  x       SPECIMENS (Sites): (Place X beside choice below)  Specimens Description   No Specimens Obtained     Washings    Lavage right middle lobe   Biopsies    Fine Needle Aspirates    Brushings    Sputum    FINDINGS:    FOREIGN BODY ASPIRATION - ASPIRATED FODD STUFFS FROM BILATERAL AIRWAYS   REMOVED MUCUS PLUGGING FROM SEVERAL AIRWAYS BILATERALLY.    FB DEBRI SENT WITH BRONCHIAL WASHINGS TO LAB FOR TESTING AND MICROBIOLOGY.  BAL DONE AT [facility] SENT FOR GM STAIN AND CULTURE  Media Information         Document Information  Photos    09/16/2020 10:23  Attached To:  Hospital Encounter on 09/15/20   Source Information  AOttie Glazier MD  [facility]    ESTIMATED BLOOD LOSS: none COMPLICATIONS/RESOLUTION: none      IMPRESSION:POST-PROCEDURE DX:   Mucus plugging of multiple airways bilaterally which was removed.  Grossly appeared to be like scrambled eggs.  Bronchial washings of bilateral airways to remove food particles.  Mucus plugging of multiple airways this was aspirated and removed >4 airways total.   [facility] was lavaged 1 time with 50cc sterile saline and showed cellular mucoid return.   RECOMMENDATION/PLAN:   MICU transfer for medical management.     FOttie Glazier M.D.  [facility] & [place]

## 2020-09-16 NOTE — ED Notes (Signed)
Pt intubated at bedside. Pt administered fentanyl 142mcg at 0948 and rocuronium  50mg  at 0949 see MAR for admin.  8/12 tube place 24 at the lip at 0951. OG tube placed at 0956 18 french 55 at lip

## 2020-09-16 NOTE — ED Notes (Signed)
Lab notified to come draw blood cultures.

## 2020-09-16 NOTE — Procedures (Signed)
Endotracheal Intubation: Patient required placement of an artificial airway secondary to Respiratory Failure  Consent: Emergent.   Hand washing performed prior to starting the procedure.   Medications administered for sedation prior to procedure:    Rocuronium 50 mg IV, Fentanyl 100 mcg IV.    A time out procedure was called and correct patient, name, & ID confirmed. Needed supplies and equipment were assembled and checked to include ETT, 10 ml syringe, Glidescope, Mac and Miller blades, suction, oxygen and bag mask valve, end tidal CO2 monitor.   Patient was positioned to align the mouth and pharynx to facilitate visualization of the glottis.   Heart rate, SpO2 and blood pressure was continuously monitored during the procedure. Pre-oxygenation was conducted prior to intubation and endotracheal tube was placed through the vocal cords into the trachea.     The artificial airway was placed under direct visualization via glidescope route using a 8.5 ETT on the first attempt.  ETT was secured at 23 cm mark.  Placement was confirmed by auscuitation of lungs with good breath sounds bilaterally and no stomach sounds.  Condensation was noted on endotracheal tube.   Pulse ox 98%.  CO2 detector in place with appropriate color change.   Complications: None .   Operator: Rufina Falco NP and Lanney Gins MD  Chest radiograph ordered and pending.   Comments: OGT placed via glidescope.   Ottie Glazier, M.D.  Pulmonary & [place]

## 2020-09-16 NOTE — Progress Notes (Signed)
NAME:  [name], MRN:  [id number], DOB:  [birth year], LOS: 1 ADMISSION DATE:  09/15/2020, CONSULTATION DATE: 09/15/2020 REFERRING MD: Dr. Kerman Passey, CHIEF COMPLAINT: Shortness of breath  History of Present Illness:  75 year old female arrived at [facility] ED via EMS from her nursing facility where she had been in her normal state of health until choking on a hot dog during her last meal.  Per ED documentation and EMS report the patient was able to cough up chunks of hot dog followed by frothy sputum and some difficulty breathing. Per ED documentation EMS stated the patient's SPO2 was 80%, she was placed on nonrebreather and transported to the ED.   ED course: Upon arrival patient's SPO2 was 90% on 15 L.  Once the patient was settled in a stretcher and sat up having calm down oxygen was able to be titrated to 6 L nasal cannula.  Initial vitals: Afebrile at 97.5, RR 14, NSR 95, BP 104/72 with a MAP of 83, SPO2 94% on 6 L nasal cannula. Significant labs: AKI with BUN/Cr: 27/1.28, leukocytosis at 15.5, hyperglycemia at 193, COVID-19 negative, CXR negative for acute disease.  PCCM consulted to evaluate possible bronchoscopy.   09/16/20- patient continued to cough out aspirated foreign body material but continued to remain hypoxemic and required significant supplemental O2.  She is full code and has no family listed per emergency contact. Donnald Garre called her family friend listed on EMR as next of kin [name]) and we discussed current critically ill state with ongoing hypoxemia. We discussed now patient has failure of BIPAP as well and will require ETT and bronchoscopy for FB aspiration.  Pertinent  Medical History  Hypertension Hypothyroidism Hypercholesterolemia Type 2 diabetes mellitus Breast cancer -right breast 2004 Arthritis MDD Bipolar disorder PTSD Significant Hospital Events: Including procedures, antibiotic start and stop dates in addition to other pertinent events   . 09/15/20-  Patient admitted to PCU after " choking event" and acute oxygen need.  Interim History / Subjective:  Patient alert and responsive sitting up in ED stretcher able to self suction with Yankauer device.  Audible secretions at back of throat the patient able to cough them up and self suction appropriately.  Patient denies any previous events with difficulty swallowing.  Patient admits some mild continued shortness of breath, denies need for oxygen use at home & only other associated symptom is a sore throat when attempting to swallow.  Objective   Blood pressure 137/79, pulse 100, temperature (!) 97.5 F (36.4 C), temperature source Axillary, resp. rate 20, height 5\' 3"  (1.6 m), weight 81.6 kg, SpO2 94 %.    FiO2 (%):  [50 %] 50 %   Intake/Output Summary (Last 24 hours) at 09/16/2020 1003 Last data filed at 09/16/2020 0906 Gross per 24 hour  Intake 50 ml  Output --  Net 50 ml   Filed Weights   09/15/20 2043  Weight: 81.6 kg    Examination: General: Adult female, acutely ill, lying in bed, NAD HEENT: MM pink/moist, anicteric, atraumatic, glasses in place neck supple Neuro: A&O x 4, able to follow commands, PERRL +3, MAE CV: s1s2 RRR, NSR-ST on monitor, no r/m/g Pulm: Regular, non labored on 6 L nasal cannula, breath sounds clear-BUL & diminished-BLL GI: soft, rounded, non tender, bs x 4 Skin: Limited exam- no rashes/lesions noted Extremities: warm/dry, pulses + 2 R/P, no edema noted  Labs/imaging that I have personally reviewed  (right click and "Reselect all SmartList Selections" daily)  Na+/ K+: 135/3.7 BUN/Cr.:  27/1.28 Serum CO2/ AG: 23/14  Hgb: 14.7 WBC/ TMAX: 15.5/afebrile  CXR 09/15/2020: Stable left basilar scarring without acute or active cardiopulmonary disease Resolved Hospital Problem list     Assessment & Plan:  Acute Hypoxic Respiratory Failure secondary to choking event Leukocytosis- 15.5 Suspected Aspiration Patient requiring acute oxygen post choking event.  Nursing has been able to wean from NRB to Nasal cannula. Patient is self-suctioning with Yankauer. Discussed case with Dr. Lanney Gins who looked over patient's chart & imaging. No need for emergent bronchoscopy, query esophageal issue? - Supplemental O2 to maintain SpO2 > 90% - Intermittent chest x-ray & ABG PRN - Ensure adequate pulmonary hygiene: flutter valve Q 2 while awake - trend PCT - Suggest initiation of Aspiration Pna coverage due to leukocytosis - Bronchodilators PRN - consider bronchoscopy if needed  Acute Kidney Injury in the setting of ACE therapy Baseline Cr:1.0 , Cr on admission:1.28 - Strict I/O's: alert provider if UOP < 0.5 mL/kg/hr - gentle IVF hydration  - Daily BMP, replace electrolytes PRN - Avoid nephrotoxic agents as able, ensure adequate renal perfusion - consider renal US  Best practice (right click and "Reselect all SmartList Selections" daily)  Diet:  NPO Pain/Anxiety/Delirium protocol (if indicated): No VAP protocol (if indicated): Not indicated DVT prophylaxis: SCD * per primary GI prophylaxis: N/A* per primary Glucose control:  SSI Yes Central venous access:  N/A Arterial line:  N/A Foley:  N/A Mobility:  Per primary PT consulted: N/A Last date of multidisciplinary goals of care discussion 09/15/20 Code Status:  full code Disposition: PCU  Labs   CBC: Recent Labs  Lab 09/15/20 2052 09/16/20 0608  WBC 15.5* 15.2*  NEUTROABS  --  13.2*  HGB 14.7 14.8  HCT 43.6 44.2  MCV 88.6 89.5  PLT 293 295    Basic Metabolic Panel: Recent Labs  Lab 09/15/20 2151 09/15/20 2259 09/16/20 0608  NA 135 136 136  K 3.7 3.7 4.1  CL 98 99 98  CO2 23 22 22   GLUCOSE 193* 175* 268*  BUN 27* 27* 27*  CREATININE 1.28* 1.24* 1.45*  CALCIUM 9.8 9.9 9.5  MG  --   --  1.5*  PHOS  --   --  3.4   GFR: Estimated Creatinine Clearance: 33.9 mL/min (A) (by C-G formula based on SCr of 1.45 mg/dL (H)). Recent Labs  Lab 09/15/20 2052 09/16/20 0608  PROCALCITON   --  0.84  WBC 15.5* 15.2*    Liver Function Tests: Recent Labs  Lab 09/15/20 2151 09/15/20 2259 09/16/20 0608  AST 16 15 18   ALT 10 10 10   ALKPHOS 44 44 44  BILITOT 0.8 0.8 1.3*  PROT 7.5 7.4 7.2  ALBUMIN 3.6 3.7 3.3*   No results for input(s): LIPASE, AMYLASE in the last 168 hours. No results for input(s): AMMONIA in the last 168 hours.  ABG No results found for: PHART, PCO2ART, PO2ART, HCO3, TCO2, ACIDBASEDEF, O2SAT   Coagulation Profile: No results for input(s): INR, PROTIME in the last 168 hours.  Cardiac Enzymes: No results for input(s): CKTOTAL, CKMB, CKMBINDEX, TROPONINI in the last 168 hours.  HbA1C: Hemoglobin A1C  Date/Time Value Ref Range Status  03/26/2012 06:17 AM 6.7 (H) 4.2 - 6.3 % Final    Comment:    The American Diabetes Association recommends that a primary goal of therapy should be <7% and that physicians should reevaluate the treatment regimen in patients with HbA1c values consistently >8%.    Hgb A1c MFr Bld  Date/Time Value Ref Range  Status  07/24/2020 01:50 PM 14.2 (H) 4.8 - 5.6 % Final    Comment:    (NOTE) Pre diabetes:          5.7%-6.4%  Diabetes:              >6.4%  Glycemic control for   <7.0% adults with diabetes   01/10/2020 03:12 PM 9.2 (H) 4.8 - 5.6 % Final    Comment:    (NOTE)         Prediabetes: 5.7 - 6.4         Diabetes: >6.4         Glycemic control for adults with diabetes: <7.0     CBG: Recent Labs  Lab 09/16/20 0341 09/16/20 0449 09/16/20 0738  GLUCAP 231* 245* 235*    Review of Systems: Positives in BOLD  Gen: Denies fever, chills, weight change, fatigue, night sweats HEENT: Denies blurred vision, double vision, hearing loss, tinnitus, sinus congestion, rhinorrhea, sore throat, neck stiffness, dysphagia PULM: Denies shortness of breath, cough, sputum production, hemoptysis, wheezing CV: Denies chest pain, edema, orthopnea, paroxysmal nocturnal dyspnea, palpitations GI: Denies abdominal pain,  nausea, vomiting, diarrhea, hematochezia, melena, constipation, change in bowel habits GU: Denies dysuria, hematuria, polyuria, oliguria, urethral discharge Endocrine: Denies hot or cold intolerance, polyuria, polyphagia or appetite change Derm: Denies rash, dry skin, scaling or peeling skin change Heme: Denies easy bruising, bleeding, bleeding gums Neuro: Denies headache, numbness, weakness, slurred speech, loss of memory or consciousness Past Medical History:  She,  has a past medical history of Arthritis, Breast cancer ([place]), Cancer ([place]) (2004), Diabetes mellitus without complication ([place]), Hypercholesterolemia, Hypertension, Hypothyroid, and Seasonal allergies.   Surgical History:   Past Surgical History:  Procedure Laterality Date  . ABDOMINAL HYSTERECTOMY    . BREAST BIOPSY Left    neg  . BREAST EXCISIONAL BIOPSY Right 2004   breast ca and lumpectomy  . BREAST LUMPECTOMY Right 2004   breast ca  . CHOLECYSTECTOMY    . COLONOSCOPY WITH PROPOFOL N/A 02/13/2017   Procedure: COLONOSCOPY WITH PROPOFOL;  Surgeon: Jonathon Bellows, MD;  Location: [facility] ENDOSCOPY;  Service: Gastroenterology;  Laterality: N/A;  . KIDNEY STONE SURGERY       Social History:   reports that she has quit smoking. Her smoking use included cigarettes. She has never used smokeless tobacco. She reports that she does not drink alcohol and does not use drugs.   Family History:  Her family history is negative for Bladder Cancer and Kidney cancer.   Allergies Allergies  Allergen Reactions  . Navane [Thiothixene] Other (See Comments)    Reaction:  Unknown  . Penicillins Rash    Has patient had a PCN reaction causing immediate rash, facial/tongue/throat swelling, SOB or lightheadedness with hypotension: No Has patient had a PCN reaction causing severe rash involving mucus membranes or skin necrosis: No Has patient had a PCN reaction that required hospitalization: No Has patient had a PCN reaction occurring within the  last 10 years: No If all of the above answers are "NO", then may proceed with Cephalosporin use.  Has patient had a PCN reaction causing immediate rash, facial/tongue/throat swelling, SOB or lightheadedness with hypotension: No Has patient had a PCN reaction causing severe rash involving mucus membranes or skin necrosis: No Has patient had a PCN reaction that required hospitalization: No Has patient had a PCN reaction occurring within the last 10 years: No If all of the above answers are "NO", then may proceed with Cephalosporin use. Has patient  had a PCN reaction causing immediate rash, facial/tongue/throat swelling, SOB or lightheadedness with hypotension: No Has patient had a PCN reaction causing severe rash involving mucus membranes or skin necrosis: No Has patient had a PCN reaction that required hospitalization: No Has patient had a PCN reaction occurring within the last 10 years: No If all of the above answers are "NO", then may proceed with Cephalosporin use.     Home Medications  Prior to Admission medications   Medication Sig Start Date End Date Taking? Authorizing Provider  clonazePAM (KLONOPIN) 1 MG tablet Take 1 tablet (1 mg total) by mouth 2 (two) times daily. 08/24/20   Lorella Nimrod, MD  divalproex (DEPAKOTE ER) 500 MG 24 hr tablet Take 2 tablets (1,000 mg total) by mouth at bedtime. 01/11/20   Clapacs, Madie Reno, MD  enalapril (VASOTEC) 5 MG tablet Take 1 tablet (5 mg total) by mouth 2 (two) times daily. 01/11/20   Clapacs, Madie Reno, MD  FLUoxetine (PROZAC) 20 MG capsule Take 60 mg by mouth daily.    [provider]  insulin detemir (LEVEMIR) 100 UNIT/ML injection Inject 0.15 mLs (15 Units total) into the skin daily. 07/31/20   Charlynne Cousins, MD  LATUDA 20 MG TABS tablet Take 20 mg by mouth daily. 08/17/20   [provider]  levothyroxine (SYNTHROID) 88 MCG tablet Take 88 mcg by mouth daily before breakfast.    [provider]  linagliptin (TRADJENTA)  5 MG TABS tablet Take 5 mg by mouth daily.    [provider]  loratadine (CLARITIN) 10 MG tablet Take 1 tablet (10 mg total) by mouth daily. 01/11/20   Clapacs, Madie Reno, MD  metFORMIN (GLUCOPHAGE) 500 MG tablet Take 2 tablets (1,000 mg total) by mouth 2 (two) times daily with a meal. 07/31/20   Charlynne Cousins, MD  ondansetron (ZOFRAN-ODT) 4 MG disintegrating tablet Take 4 mg by mouth every 6 (six) hours as needed for nausea. 08/16/20   [provider]  rosuvastatin (CRESTOR) 40 MG tablet Take 1 tablet (40 mg total) by mouth daily. 01/11/20   Clapacs, Madie Reno, MD       Critical care provider statement:    Critical care time (minutes):  33   Critical care time was exclusive of:  Separately billable procedures and  treating other patients   Critical care was necessary to treat or prevent imminent or  life-threatening deterioration of the following conditions:  FB aspiration, acute hypoxemic respiratory failure   Critical care was time spent personally by me on the following  activities:  Development of treatment plan with patient or surrogate,  discussions with consultants, evaluation of patient's response to  treatment, examination of patient, obtaining history from patient or  surrogate, ordering and performing treatments and interventions, ordering  and review of laboratory studies and re-evaluation of patient's condition   I assumed direction of critical care for this patient from another  provider in my specialty: no     Ottie Glazier, M.D.  Pulmonary & [place]

## 2020-09-16 NOTE — ED Notes (Addendum)
Bronchoscopy for foreign body removal at bedside. Time out done at 1021. Procedure started at 1023. Pt vitals stable during procedure no complication. forein body removed by MD. Procedure end time 1026.

## 2020-09-16 NOTE — Progress Notes (Signed)
Brief note regarding plan, with full H&P to follow:  75 year old female who is admitted with acute hypoxic respiratory failure in the setting of aspiration pneumonia after presenting from SNF following witnessed aspiration event.  Oxygen saturations in the mid 90s on 6 L nonrebreather.  EDP discussed case with the on-call critical care physician, Dr.Aleskerove, who recommended admission to the hospitalist service, with plan for critical care service to follow along, with anticipated bronchoscopy tomorrow (09/16/20) along with interval antibiotics for suspected aspiration pneumonia.  Currently on meropenem.     Babs Bertin, DO Hospitalist

## 2020-09-16 NOTE — ED Notes (Signed)
ICU MD and respiratory at bedside for intubation.

## 2020-09-16 NOTE — ED Notes (Signed)
Pt bipap off pt nose- pt noted to have sats at 67%- bipap adjusted and sats up to 74% but went back down- called respiratory who is now at bedside

## 2020-09-16 NOTE — ED Notes (Signed)
Bolus dose administered at 40mcg and Fentanyl gtt started at 8mcg/hr.

## 2020-09-16 NOTE — Progress Notes (Signed)
Bronchoscopy performed at bedside using therapeutic scope # [id number] for foreign body aspiration. Time out was performed by Dr. Lanney Gins and documented by Charolette Child. Time scope entered into airway was 1023 and was removed from airway at 1026. No complications from procedure was noted.

## 2020-09-17 ENCOUNTER — Inpatient Hospital Stay: Payer: Medicare Other

## 2020-09-17 ENCOUNTER — Encounter: Payer: Self-pay | Admitting: Internal Medicine

## 2020-09-17 DIAGNOSIS — E039 Hypothyroidism, unspecified: Secondary | ICD-10-CM | POA: Diagnosis not present

## 2020-09-17 DIAGNOSIS — J9601 Acute respiratory failure with hypoxia: Secondary | ICD-10-CM | POA: Diagnosis not present

## 2020-09-17 DIAGNOSIS — J690 Pneumonitis due to inhalation of food and vomit: Secondary | ICD-10-CM | POA: Diagnosis not present

## 2020-09-17 DIAGNOSIS — R531 Weakness: Secondary | ICD-10-CM

## 2020-09-17 DIAGNOSIS — E1169 Type 2 diabetes mellitus with other specified complication: Secondary | ICD-10-CM | POA: Diagnosis not present

## 2020-09-17 LAB — GLUCOSE, CAPILLARY
Glucose-Capillary: 135 mg/dL — ABNORMAL HIGH (ref 70–99)
Glucose-Capillary: 146 mg/dL — ABNORMAL HIGH (ref 70–99)
Glucose-Capillary: 151 mg/dL — ABNORMAL HIGH (ref 70–99)
Glucose-Capillary: 152 mg/dL — ABNORMAL HIGH (ref 70–99)
Glucose-Capillary: 161 mg/dL — ABNORMAL HIGH (ref 70–99)
Glucose-Capillary: 166 mg/dL — ABNORMAL HIGH (ref 70–99)
Glucose-Capillary: 168 mg/dL — ABNORMAL HIGH (ref 70–99)

## 2020-09-17 LAB — VALPROIC ACID LEVEL: Valproic Acid Lvl: <10 ug/mL — ABNORMAL LOW (ref 50.0–100.0)

## 2020-09-17 LAB — CBC
HCT: 36.7 % (ref 36.0–46.0)
Hemoglobin: 12.7 g/dL (ref 12.0–15.0)
MCH: 30.5 pg (ref 26.0–34.0)
MCHC: 34.6 g/dL (ref 30.0–36.0)
MCV: 88.2 fL (ref 80.0–100.0)
Platelets: 218 10*3/uL (ref 150–400)
RBC: 4.16 MIL/uL (ref 3.87–5.11)
RDW: 13.7 % (ref 11.5–15.5)
WBC: 15.2 10*3/uL — ABNORMAL HIGH (ref 4.0–10.5)
nRBC: 0 % (ref 0.0–0.2)

## 2020-09-17 LAB — BASIC METABOLIC PANEL
Anion gap: 10 (ref 5–15)
BUN: 26 mg/dL — ABNORMAL HIGH (ref 8–23)
CO2: 26 mmol/L (ref 22–32)
Calcium: 9.1 mg/dL (ref 8.9–10.3)
Chloride: 102 mmol/L (ref 98–111)
Creatinine, Ser: 0.96 mg/dL (ref 0.44–1.00)
GFR, Estimated: >60 mL/min (ref >60)
Glucose, Bld: 169 mg/dL — ABNORMAL HIGH (ref 70–99)
Potassium: 3.2 mmol/L — ABNORMAL LOW (ref 3.5–5.1)
Sodium: 138 mmol/L (ref 135–145)

## 2020-09-17 LAB — MAGNESIUM: Magnesium: 1.7 mg/dL (ref 1.7–2.4)

## 2020-09-17 LAB — PROCALCITONIN: Procalcitonin: 2.6 ng/mL

## 2020-09-17 LAB — PHOSPHORUS: Phosphorus: 2.7 mg/dL (ref 2.5–4.6)

## 2020-09-17 IMAGING — DX DG CHEST 1V PORT
1 series · 1 of 1 positions shown · non-contrast
Comparison: Radiograph [DATE]

CLINICAL DATA: Acute respiratory failure

EXAM:
PORTABLE CHEST 1 VIEW

[chest ap]
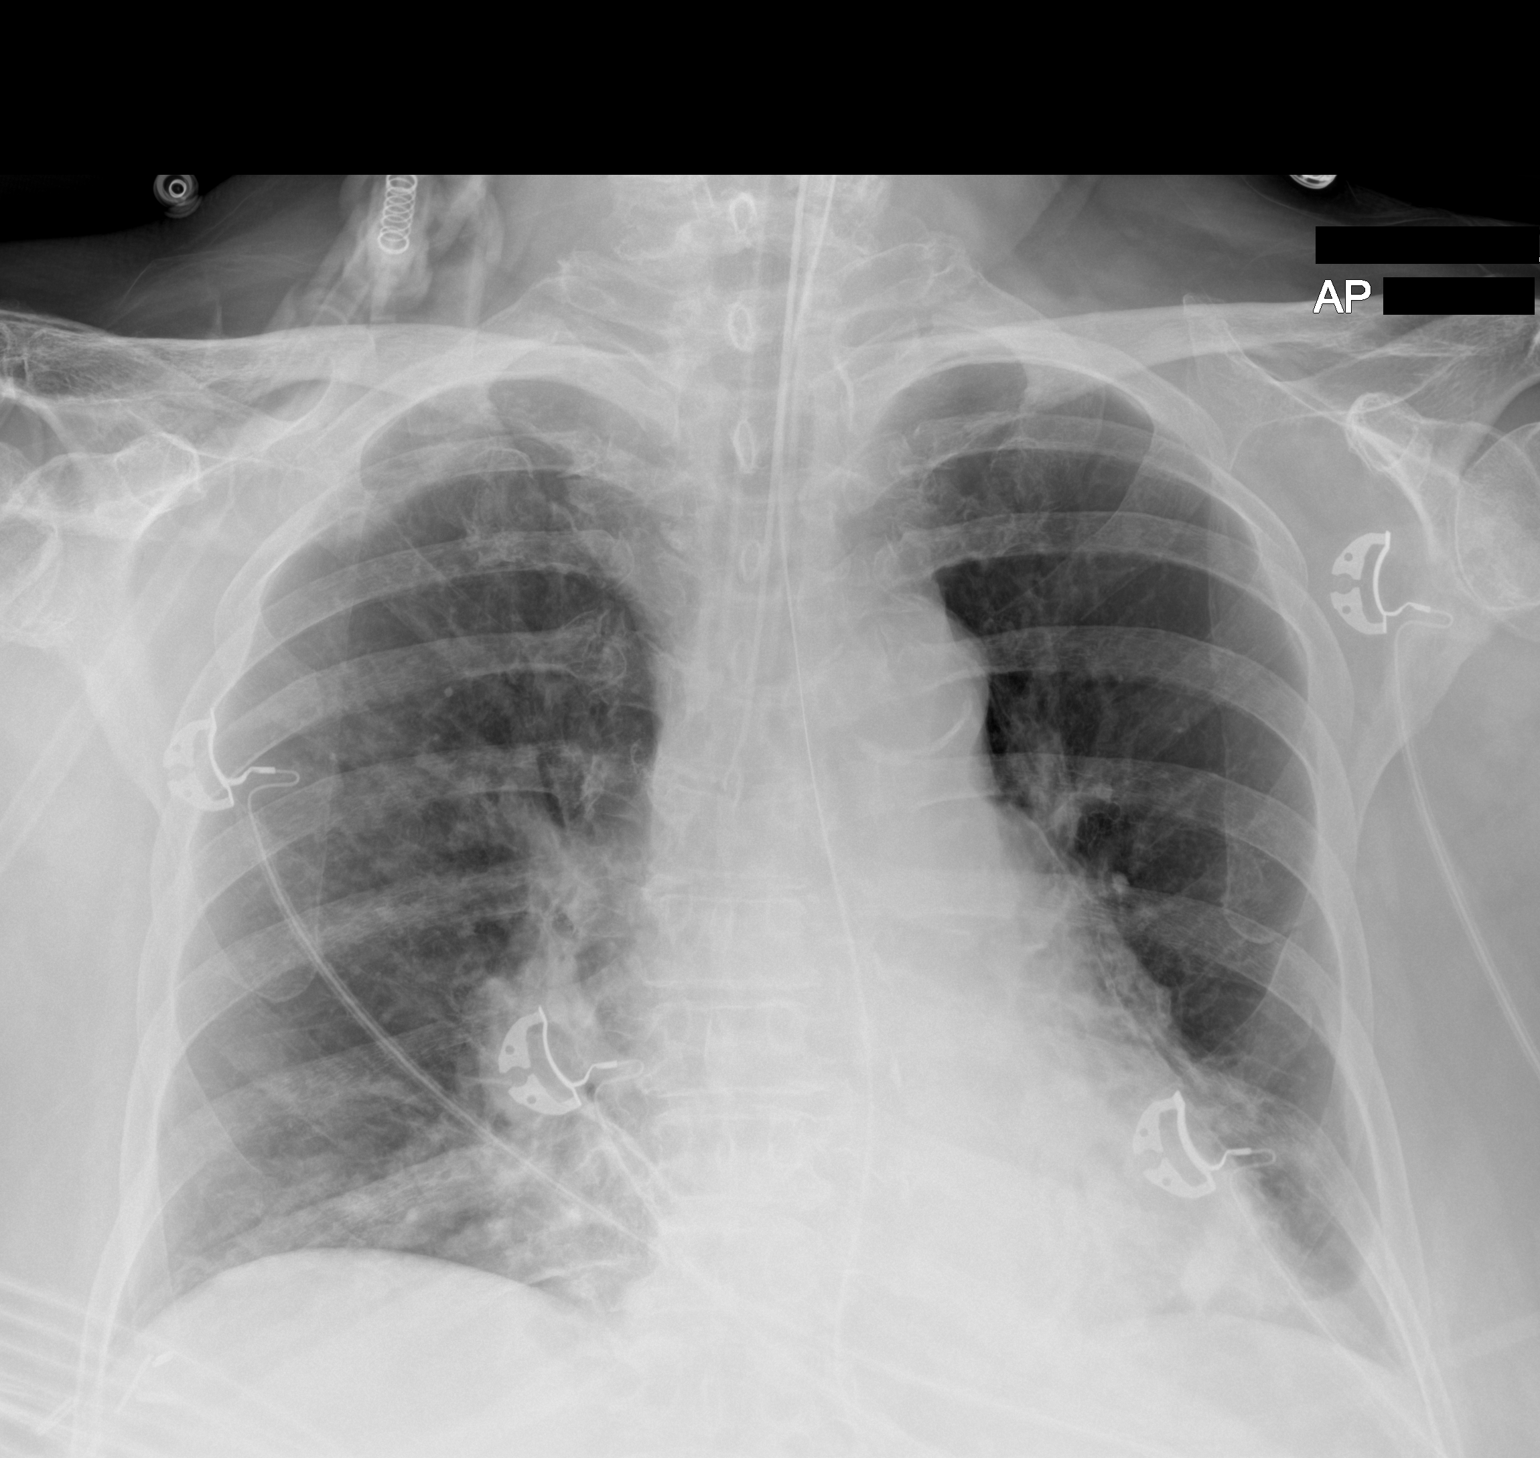

[1 of 1 positions shown; findings below may reference images not displayed]

FINDINGS: *Low positioning of the endotracheal tube approximately 1 cm from
the carina.
*Transesophageal tube tip and side port terminate below the margins
of imaging, beyond the GE junction.
*Telemetry leads and external support devices overlie the chest.

Some persistent hazy bibasilar retrocardiac opacity, slightly
diminished from comparison exam though may be related to improving
lung volumes. No visible pneumothorax or effusion. Stable
cardiomediastinal contours with a calcified aorta. No acute osseous
or soft tissue abnormality.
IMPRESSION: Persistent though slightly improving bibasilar and retrocardiac
opacities. Some slight interval clearing may be related to improving
lung volumes.

Low positioning of the endotracheal tube approximately 1 cm from the
carina. Consider retraction 2-3 cm to position at the mid trachea.

Additional lines and tubes as above.

These results will be called to the ordering clinician or
representative by the Radiologist Assistant, and communication
documented in the PACS or [REDACTED].

## 2020-09-17 MED ORDER — PANTOPRAZOLE SODIUM 40 MG IV SOLR
40.0000 mg | INTRAVENOUS | Status: DC
  Administered 2020-09-17 – 2020-09-18 (×2): 40 mg via INTRAVENOUS
  Filled 2020-09-17 (×2): qty 40

## 2020-09-17 MED ORDER — VALPROATE SODIUM 100 MG/ML IV SOLN
250.0000 mg | Freq: Four times a day (QID) | INTRAVENOUS | Status: DC
  Administered 2020-09-17 – 2020-09-18 (×3): 250 mg via INTRAVENOUS
  Filled 2020-09-17 (×5): qty 2.5

## 2020-09-17 MED ORDER — ORAL CARE MOUTH RINSE
15.0000 mL | OROMUCOSAL | Status: DC
  Administered 2020-09-17: 15 mL via OROMUCOSAL

## 2020-09-17 MED ORDER — SODIUM CHLORIDE 0.9 % IV SOLN: INTRAVENOUS | Status: DC

## 2020-09-17 MED ORDER — MAGNESIUM SULFATE 2 GM/50ML IV SOLN: 2.0000 g | Freq: Once | INTRAVENOUS | Status: DC

## 2020-09-17 MED ORDER — POTASSIUM CHLORIDE 20 MEQ PO PACK
20.0000 meq | PACK | ORAL | Status: AC
  Administered 2020-09-17 (×2): 20 meq
  Filled 2020-09-17 (×2): qty 1

## 2020-09-17 MED ORDER — CHLORHEXIDINE GLUCONATE 0.12% ORAL RINSE (MEDLINE KIT)
15.0000 mL | Freq: Two times a day (BID) | OROMUCOSAL | Status: DC
  Administered 2020-09-17 – 2020-10-02 (×21): 15 mL via OROMUCOSAL
  Filled 2020-09-17: qty 15

## 2020-09-17 MED ORDER — MAGNESIUM SULFATE 2 GM/50ML IV SOLN
2.0000 g | Freq: Once | INTRAVENOUS | Status: AC
  Administered 2020-09-17: 2 g via INTRAVENOUS
  Filled 2020-09-17: qty 50

## 2020-09-17 MED ORDER — LURASIDONE HCL 20 MG PO TABS
20.0000 mg | ORAL_TABLET | Freq: Every day | ORAL | Status: DC
  Administered 2020-09-19 – 2020-10-02 (×14): 20 mg via ORAL
  Filled 2020-09-17 (×16): qty 1

## 2020-09-17 MED ORDER — ENOXAPARIN SODIUM 40 MG/0.4ML IJ SOSY
40.0000 mg | PREFILLED_SYRINGE | INTRAMUSCULAR | Status: DC
  Administered 2020-09-17 – 2020-10-01 (×15): 40 mg via SUBCUTANEOUS
  Filled 2020-09-17 (×14): qty 0.4

## 2020-09-17 MED ORDER — POTASSIUM CHLORIDE 10 MEQ/100ML IV SOLN
10.0000 meq | INTRAVENOUS | Status: AC
  Administered 2020-09-17 (×4): 10 meq via INTRAVENOUS
  Filled 2020-09-17 (×4): qty 100

## 2020-09-17 MED ORDER — LEVOTHYROXINE SODIUM 88 MCG PO TABS
88.0000 ug | ORAL_TABLET | Freq: Every day | ORAL | Status: DC
  Administered 2020-09-19 – 2020-10-02 (×13): 88 ug via ORAL
  Filled 2020-09-17 (×15): qty 1

## 2020-09-17 MED ORDER — CLONAZEPAM 0.5 MG PO TABS: 0.5000 mg | ORAL_TABLET | Freq: Two times a day (BID) | ORAL | Status: DC

## 2020-09-17 NOTE — Progress Notes (Signed)
Patient extubated this morning to 3LNC O2 without any difficulty. Patient remains intermittently drowsy throughout the shift. Patient oriented x4, denies any complaints or concerns. She has required intermittent low dose levophed infusion 1-2 mcg/kg/min. When patient is sleeping, BP tends to be 80-90s/50s (MAP 60s) versus when patient is awake SBP 100-115/60s. Patient oliguric, bladder scanned for 115 mL. Patient subsequently voided 150 mL. Discussed low UOP with Dr. Earleen Newport, NS 73ml/hr continuous infusion ordered.   SLP consulted for history of aspiration. Due to drowsiness and hoarse voice, bedside swallow eval deferred at this time with plans to keep NPO overnight.   Patient's estranged son, [name], called for patient update. Contact information placed in chart.

## 2020-09-17 NOTE — Progress Notes (Signed)
Patient ID: [name], female   DOB: December 26, 1945, 75 y.o.   MRN: [id number] [facility] PROGRESS NOTE  Briteny Fulghum RJJ:[id number] DOB: February 03, 1946 DOA: 09/15/2020 PCP: Mechele Claude, FNP  HPI/Subjective: Patient a little slow with her answers today.  Has a little right arm tremor.  Said yes to taking psychiatric medications.  Admitted with acute hypoxic respiratory failure and aspiration.  Had to be intubated for bronchoscopy but was extubated this morning.  Objective: Vitals:   09/17/20 0945 09/17/20 1000  BP: (!) 108/55 (!) 113/50  Pulse: 80 98  Resp: (!) 50 (!) 23  Temp:    SpO2: 93% 95%    Intake/Output Summary (Last 24 hours) at 09/17/2020 1246 Last data filed at 09/17/2020 0160 Gross per 24 hour  Intake 199.63 ml  Output 550 ml  Net -350.37 ml   Filed Weights   09/15/20 2043 09/16/20 1327 09/17/20 0443  Weight: 81.6 kg 74 kg 74.3 kg    ROS: Review of Systems  Respiratory: Positive for cough and shortness of breath.   Cardiovascular: Negative for chest pain.  Gastrointestinal: Negative for abdominal pain, nausea and vomiting.   Exam: Physical Exam HENT:     Head: Normocephalic.     Mouth/Throat:     Pharynx: No oropharyngeal exudate.  Eyes:     General: Lids are normal.     Conjunctiva/sclera: Conjunctivae normal.     Pupils: Pupils are equal, round, and reactive to light.  Cardiovascular:     Rate and Rhythm: Regular rhythm. Bradycardia present.     Heart sounds: Normal heart sounds, S1 normal and S2 normal.  Pulmonary:     Breath sounds: Examination of the right-lower field reveals decreased breath sounds and rhonchi. Examination of the left-lower field reveals decreased breath sounds. Decreased breath sounds and rhonchi present. No wheezing or rales.  Abdominal:     Palpations: Abdomen is soft.     Tenderness: There is no abdominal tenderness.  Musculoskeletal:     Right ankle: No swelling.     Left ankle: No swelling.  Skin:     General: Skin is warm.     Findings: No rash.  Neurological:     Mental Status: She is alert.     Comments: Slow with answers.  Right arm tremor.       Data Reviewed: Basic Metabolic Panel: Recent Labs  Lab 09/15/20 2151 09/15/20 2259 09/16/20 0608 09/17/20 0405  NA 135 136 136 138  K 3.7 3.7 4.1 3.2*  CL 98 99 98 102  CO2 _0 GLUCOSE 193* 175* 268* 169*  BUN 27* 27* 27* 26*  CREATININE 1.28* 1.24* 1.45* 0.96  CALCIUM 9.8 9.9 9.5 9.1  MG  --   --  1.5* 1.7  PHOS  --   --  3.4 2.7   Liver Function Tests: Recent Labs  Lab 09/15/20 2151 09/15/20 2259 09/16/20 0608  AST _1 ALT _2 ALKPHOS 44 44 44  BILITOT 0.8 0.8 1.3*  PROT 7.5 7.4 7.2  ALBUMIN 3.6 3.7 3.3*   CBC: Recent Labs  Lab 09/15/20 2052 09/16/20 0608 09/17/20 0405  WBC 15.5* 15.2* 15.2*  NEUTROABS  --  13.2*  --   HGB 14.7 14.8 12.7  HCT 43.6 44.2 36.7  MCV 88.6 89.5 88.2  PLT 293 246 218   BNP (last 3 results) Recent Labs    03/01/20 1312  BNP 82.3    CBG: Recent Labs  Lab 09/16/20 2052 09/17/20 0001 09/17/20 0438 09/17/20 0727 09/17/20 1105  GLUCAP 186* 166* 161* 152* 168*    Recent Results (from the past 240 hour(s))  Resp Panel by RT-PCR (Flu A&B, Covid) Nasopharyngeal Swab     Status: None   Collection Time: 09/15/20  8:52 PM   Specimen: Nasopharyngeal Swab; Nasopharyngeal(NP) swabs in vial transport medium  Result Value Ref Range Status   SARS Coronavirus 2 by RT PCR NEGATIVE NEGATIVE Final    Comment: (NOTE) SARS-CoV-2 target nucleic acids are NOT DETECTED.  The SARS-CoV-2 RNA is generally detectable in upper respiratory specimens during the acute phase of infection. The lowest concentration of SARS-CoV-2 viral copies this assay can detect is 138 copies/mL. A negative result does not preclude SARS-Cov-2 infection and should not be used as the sole basis for treatment or other patient management decisions. A negative result may occur with   improper specimen collection/handling, submission of specimen other than nasopharyngeal swab, presence of viral mutation(s) within the areas targeted by this assay, and inadequate number of viral copies(<138 copies/mL). A negative result must be combined with clinical observations, patient history, and epidemiological information. The expected result is Negative.  Fact Sheet for Patients:  EntrepreneurPulse.com.au  Fact Sheet for Healthcare Providers:  IncredibleEmployment.be  This test is no t yet approved or cleared by the [place] FDA and  has been authorized for detection and/or diagnosis of SARS-CoV-2 by FDA under an Emergency Use Authorization (EUA). This EUA will remain  in effect (meaning this test can be used) for the duration of the COVID-19 declaration under Section 564(b)(1) of the Act, 21 U.S.C.section 360bbb-3(b)(1), unless the authorization is terminated  or revoked sooner.       Influenza A by PCR NEGATIVE NEGATIVE Final   Influenza B by PCR NEGATIVE NEGATIVE Final    Comment: (NOTE) The Xpert Xpress SARS-CoV-2/FLU/RSV plus assay is intended as an aid in the diagnosis of influenza from Nasopharyngeal swab specimens and should not be used as a sole basis for treatment. Nasal washings and aspirates are unacceptable for Xpert Xpress SARS-CoV-2/FLU/RSV testing.  Fact Sheet for Patients: EntrepreneurPulse.com.au  Fact Sheet for Healthcare Providers: IncredibleEmployment.be  This test is not yet approved or cleared by the [place] FDA and has been authorized for detection and/or diagnosis of SARS-CoV-2 by FDA under an Emergency Use Authorization (EUA). This EUA will remain in effect (meaning this test can be used) for the duration of the COVID-19 declaration under Section 564(b)(1) of the Act, 21 U.S.C. section 360bbb-3(b)(1), unless the authorization is terminated  or revoked.  Performed at [facility], [address] [place], [place] [address]   Culture, blood (Routine X 2) w Reflex to ID Panel     Status: None (Preliminary result)   Collection Time: 09/16/20  6:08 AM   Specimen: BLOOD  Result Value Ref Range Status   Specimen Description BLOOD RIGHT ANTECUBITAL  Final   Special Requests   Final    BOTTLES DRAWN AEROBIC AND ANAEROBIC Blood Culture adequate volume   Culture   Final    NO GROWTH < 24 HOURS Performed at [facility], [address], [place], [place] [address]    Report Status PENDING  Incomplete  Culture, blood (Routine X 2) w Reflex to ID Panel     Status: None (Preliminary result)   Collection Time: 09/16/20  7:24 AM   Specimen: BLOOD  Result Value Ref Range Status   Specimen Description BLOOD BLOOD LEFT HAND  Final  Special Requests   Final    BOTTLES DRAWN AEROBIC AND ANAEROBIC Blood Culture adequate volume   Culture   Final    NO GROWTH < 24 HOURS Performed at [facility], [address] [place], [place] [address]    Report Status PENDING  Incomplete  Culture, BAL-quantitative w Gram Stain     Status: None (Preliminary result)   Collection Time: 09/16/20 10:31 AM   Specimen: Bronchoalveolar Lavage; Respiratory  Result Value Ref Range Status   Specimen Description   Final    BRONCHIAL ALVEOLAR LAVAGE Performed at [facility], [address], [place], [place] [address]    Special Requests   Final    NONE Performed at [facility], [address] [place], [place] [address]    Gram Stain PENDING  Incomplete   Culture   Final    NO GROWTH < 24 HOURS Performed at [facility], [place] [address] [place], [place] [address]    Report Status PENDING  Incomplete  Culture, BAL-quantitative w Gram Stain     Status: None (Preliminary result)   Collection Time: 09/16/20 10:31 AM   Specimen: Bronchial Wash; Respiratory  Result Value Ref Range Status   Specimen  Description   Final    BRONCHIAL WASHINGS Performed at [facility], [address] [place], [place] [address]    Special Requests   Final    TRACHEAL ASPIRATE Performed at [facility], [address] [place], [place] [address]    Gram Stain   Final    ABUNDANT WBC PRESENT, PREDOMINANTLY PMN RARE GRAM POSITIVE COCCI    Culture   Final    NO GROWTH < 24 HOURS Performed at [facility], [place] [address] [place], [place] [address]    Report Status PENDING  Incomplete  MRSA PCR Screening     Status: None   Collection Time: 09/16/20  1:30 PM   Specimen: Nasopharyngeal  Result Value Ref Range Status   MRSA by PCR NEGATIVE NEGATIVE Final    Comment:        The GeneXpert MRSA Assay (FDA approved for NASAL specimens only), is one component of a comprehensive MRSA colonization surveillance program. It is not intended to diagnose MRSA infection nor to guide or monitor treatment for MRSA infections. Performed at [facility], [address], [place], [place] [address]      Studies: DG Chest 1 View  Result Date: 09/16/2020 CLINICAL DATA:  Status post intubation. EXAM: CHEST  1 VIEW COMPARISON:  Earlier same day FINDINGS: 1008 hours. Endotracheal tube tip is 2 cm above the base of the carina. Interval increase in bibasilar collapse/consolidative opacity potentially atelectasis although aspiration would be a consideration. The NG tube passes into the stomach although the distal tip position is not included on the film. Cardiopericardial silhouette is at upper limits of normal for size. The visualized bony structures of the thorax show no acute abnormality. Telemetry leads overlie the chest. IMPRESSION: 1. Endotracheal tube tip 2 cm above the base of the carina. 2. Worsening bibasilar collapse/consolidative opacity, potentially atelectasis although aspiration would be a consideration. Electronically Signed   By: Misty Stanley M.D.   On: 09/16/2020 10:46    DG Abd 1 View  Result Date: 09/16/2020 CLINICAL DATA:  OG tube placement EXAM: ABDOMEN - 1 VIEW COMPARISON:  February 15, 2015 FINDINGS: The OG tube terminates in the stomach. IMPRESSION: The OG tube terminates in the stomach. Electronically Signed  By: Dorise Bullion III M.D   On: 09/16/2020 16:11   DG Chest Port 1 View  Result Date: 09/17/2020 CLINICAL DATA:  Acute respiratory failure EXAM: PORTABLE CHEST 1 VIEW COMPARISON:  Radiograph 09/16/2020 FINDINGS: *Low positioning of the endotracheal tube approximately 1 cm from the carina. *Transesophageal tube tip and side port terminate below the margins of imaging, beyond the GE junction. *Telemetry leads and external support devices overlie the chest. Some persistent hazy bibasilar retrocardiac opacity, slightly diminished from comparison exam though may be related to improving lung volumes. No visible pneumothorax or effusion. Stable cardiomediastinal contours with a calcified aorta. No acute osseous or soft tissue abnormality. IMPRESSION: Persistent though slightly improving bibasilar and retrocardiac opacities. Some slight interval clearing may be related to improving lung volumes. Low positioning of the endotracheal tube approximately 1 cm from the carina. Consider retraction 2-3 cm to position at the mid trachea. Additional lines and tubes as above. These results will be called to the ordering clinician or representative by the Radiologist Assistant, and communication documented in the PACS or [employer]. Electronically Signed   By: Lovena Le M.D.   On: 09/17/2020 06:26   DG Chest Port 1 View  Result Date: 09/16/2020 CLINICAL DATA:  Initial evaluation for acute shortness of breath. EXAM: PORTABLE CHEST 1 VIEW COMPARISON:  Prior radiograph from 09/15/2020. FINDINGS: Transverse heart size stable, and remains within normal limits. Mediastinal silhouette normal. Aortic atherosclerosis. There is increased patchy density within the right  infrahilar region, which could reflect atelectasis and/or infiltrate. Mild scarring at the left lung base is stable. No pulmonary edema or pleural effusion. No pneumothorax. Surgical clips overlie the right lower chest. Osseous structures are unchanged. IMPRESSION: 1. Patchy right infrahilar opacity, which could reflect atelectasis and/or infiltrate. 2.  Aortic Atherosclerosis (ICD10-I70.0). Electronically Signed   By: Jeannine Boga M.D.   On: 09/16/2020 05:54   DG Chest Portable 1 View  Result Date: 09/15/2020 CLINICAL DATA:  Shortness of breath. EXAM: PORTABLE CHEST 1 VIEW COMPARISON:  August 21, 2020 FINDINGS: The lungs are hyperinflated. Mild, stable scarring is seen within the left lung base. There is no evidence of a pleural effusion or pneumothorax. The heart size and mediastinal contours are within normal limits. Tortuosity and calcification of the descending thoracic aorta is seen. Radiopaque surgical clips are seen overlying the soft tissues of the right breast. Degenerative changes are noted throughout the thoracic spine. IMPRESSION: Stable left basilar scarring without acute or active cardiopulmonary disease. Electronically Signed   By: Virgina Norfolk M.D.   On: 09/15/2020 21:33    Scheduled Meds: . albuterol  2.5 mg Nebulization Q6H  . chlorhexidine gluconate (MEDLINE KIT)  15 mL Mouth Rinse BID  . Chlorhexidine Gluconate Cloth  6 each Topical Q2200  . clonazePAM  0.5 mg Per Tube BID  . divalproex  1,000 mg Oral QHS  . docusate  100 mg Per Tube BID  . FLUoxetine  60 mg Oral Daily  . insulin aspart  0-15 Units Subcutaneous Q4H  . levothyroxine  88 mcg Per Tube QAC breakfast  . lurasidone  20 mg Per Tube Daily  . polyethylene glycol  17 g Per Tube Daily   Continuous Infusions: . sodium chloride 250 mL (09/16/20 2048)  . cefTRIAXone (ROCEPHIN)  IV Stopped (09/16/20 1804)  . fentaNYL infusion INTRAVENOUS 75 mcg/hr (09/16/20 1830)  . metronidazole 500 mg (09/17/20 1143)  .  norepinephrine (LEVOPHED) Adult infusion 2 mcg/min (09/17/20 0412)    Assessment/Plan:  1. Acute  hypoxic respiratory failure secondary to aspiration.  Yesterday was on BiPAP and had increasing oxygen requirements up to 100% and eventually required intubation for bronchoscopy.  Food was removed from the lungs.  Patient was on the ventilator overnight and extubated this morning and currently on 3 L of oxygen.  Empiric antibiotics.  We will get swallow evaluation tomorrow.  Nursing staff will see if she is able to swallow this afternoon. 2. Hypothyroidism unspecified on levothyroxine 3. Type 2 diabetes mellitus with hyperlipidemia.  Holding cholesterol medication.  Currently on sliding scale insulin. 4. Anxiety depression on numerous psychiatric medications.  The patient was able to say yes to each psychiatric medication that we listed. 5. Hypomagnesemia.  Replace magnesium IV again today and recheck tomorrow. 6. Weakness we will get physical therapy evaluation for tomorrow.      Code Status:     Code Status Orders  (From admission, onward)         Start     Ordered   09/15/20 2241  Full code  Continuous        09/15/20 2241        Code Status History    Date Active Date Inactive Code Status Order ID Comments User Context   08/22/2020 0250 08/24/2020 1656 Full Code [id number]  Athena Masse, MD ED   07/24/2020 1148 08/02/2020 1622 Full Code [id number]  Collier Bullock, MD ED   01/07/2020 2216 01/11/2020 1627 Full Code [id number]  Eulas Post, MD Inpatient   01/07/2020 1309 01/07/2020 2214 Full Code [id number]  Carrie Mew, MD ED   08/10/2019 2148 08/13/2019 1947 Full Code [id number]  Dixie Dials, MD Inpatient   06/24/2019 2219 06/29/2019 1932 Full Code [id number]  Caroline Sauger, NP Inpatient   07/21/2018 1258 07/27/2018 1734 Full Code [id number]  Lavella Hammock, MD Inpatient   07/21/2018 1258 07/21/2018 1258 Full Code [id number]  Lavella Hammock, MD Inpatient   02/02/2018 1857  02/09/2018 1914 Full Code [id number]  Gonzella Lex, MD Inpatient   07/07/2017 1633 07/10/2017 2004 Full Code [id number]  Gonzella Lex, MD Inpatient   09/20/2014 2036 09/23/2014 1901 Full Code [id number]  Clapacs, Madie Reno, MD Inpatient   Advance Care Planning Activity     Family Communication: Spoke with the patient's son [name] on the phone.  His number is [phone] Disposition Plan: Status is: Inpatient  Dispo: The patient is from: Rehab              Anticipated d/c is to: Patient's contact yesterday mentioned that the rehab was looking into assisted living facilities for her.              Patient currently extubated this morning and being treated for aspiration pneumonia   Difficult to place patient.  Hopefully not.  Consultants:  Critical care specialist  Procedures:  Intubation and extubation  Bronchoscopy  Antibiotics:  Rocephin  Flagyl  Time spent: 27 minutes, case discussed with critical care specialist  Maxen Rowland [facility]  [facility]

## 2020-09-17 NOTE — Progress Notes (Signed)
NAME:  [name], MRN:  [id number], DOB:  08/04/1945, LOS: 2 ADMISSION DATE:  09/15/2020, CONSULTATION DATE: 09/15/2020 REFERRING MD: Dr. Kerman Passey, CHIEF COMPLAINT: Shortness of breath  History of Present Illness:  75 year old female arrived at [facility] ED via EMS from her nursing facility where she had been in her normal state of health until choking on a hot dog during her last meal.  Per ED documentation and EMS report the patient was able to cough up chunks of hot dog followed by frothy sputum and some difficulty breathing. Per ED documentation EMS stated the patient's SPO2 was 80%, she was placed on nonrebreather and transported to the ED.   ED course: Upon arrival patient's SPO2 was 90% on 15 L.  Once the patient was settled in a stretcher and sat up having calm down oxygen was able to be titrated to 6 L nasal cannula.  Initial vitals: Afebrile at 97.5, RR 14, NSR 95, BP 104/72 with a MAP of 83, SPO2 94% on 6 L nasal cannula. Significant labs: AKI with BUN/Cr: 27/1.28, leukocytosis at 15.5, hyperglycemia at 193, COVID-19 negative, CXR negative for acute disease.  PCCM consulted to evaluate possible bronchoscopy.   09/16/20- patient continued to cough out aspirated foreign body material but continued to remain hypoxemic and required significant supplemental O2.  She is full code and has no family listed per emergency contact. Donnald Garre called her family friend listed on EMR as next of kin [name]) and we discussed current critically ill state with ongoing hypoxemia. We discussed now patient has failure of BIPAP as well and will require ETT and bronchoscopy for FB aspiration.  09/17/20- patient on spontaneous trial.  She did well after bronchoscopy and was able to be weaned down on FiO2, today plan is to liberate from MV and hopefully transfer out to medical floor with TRH. I reviewed plan with Dr Leslye Peer.   Pertinent  Medical History   Hypertension Hypothyroidism Hypercholesterolemia Type 2 diabetes mellitus Breast cancer -right breast 2004 Arthritis MDD Bipolar disorder PTSD Significant Hospital Events: Including procedures, antibiotic start and stop dates in addition to other pertinent events   . 09/15/20- Patient admitted to PCU after " choking event" and acute oxygen need.  Interim History / Subjective:  Patient alert and responsive sitting up in ED stretcher able to self suction with Yankauer device.  Audible secretions at back of throat the patient able to cough them up and self suction appropriately.  Patient denies any previous events with difficulty swallowing.  Patient admits some mild continued shortness of breath, denies need for oxygen use at home & only other associated symptom is a sore throat when attempting to swallow.  Objective   Blood pressure (!) 92/48, pulse 72, temperature 99.3 F (37.4 C), temperature source Axillary, resp. rate 16, height 5\' 3"  (1.6 m), weight 74.3 kg, SpO2 100 %.    Vent Mode: PRVC FiO2 (%):  [30 %-100 %] 30 % Set Rate:  [16 bmp] 16 bmp Vt Set:  [450 mL] 450 mL PEEP:  [5 cmH20] 5 cmH20 Plateau Pressure:  [11 cmH20-20 cmH20] 15 cmH20   Intake/Output Summary (Last 24 hours) at 09/17/2020 0843 Last data filed at 09/17/2020 0093 Gross per 24 hour  Intake 249.63 ml  Output 550 ml  Net -300.37 ml   Filed Weights   09/15/20 2043 09/16/20 1327 09/17/20 0443  Weight: 81.6 kg 74 kg 74.3 kg    Examination: General: Adult female, acutely ill, lying in bed, NAD HEENT: MM  pink/moist, anicteric, atraumatic, glasses in place neck supple Neuro: A&O x 4, able to follow commands, PERRL +3, MAE CV: s1s2 RRR, NSR-ST on monitor, no r/m/g Pulm: Regular, non labored on 6 L nasal cannula, breath sounds clear-BUL & diminished-BLL GI: soft, rounded, non tender, bs x 4 Skin: Limited exam- no rashes/lesions noted Extremities: warm/dry, pulses + 2 R/P, no edema noted  Labs/imaging that I  have personally reviewed  (right click and "Reselect all SmartList Selections" daily)  Na+/ K+: 135/3.7 BUN/Cr.:  27/1.28 Serum CO2/ AG: 23/14  Hgb: 14.7 WBC/ TMAX: 15.5/afebrile  CXR 09/15/2020: Stable left basilar scarring without acute or active cardiopulmonary disease Resolved Hospital Problem list     Assessment & Plan:  Acute Hypoxic Respiratory Failure secondary to choking event Leukocytosis- 15.5 Suspected Aspiration        -s/p bronchoscopy - aspirated food particles with mucoid debris - wean off MV - Liberate if SBT RSBI <100   Acute Kidney Injury in the setting of ACE therapy Baseline Cr:1.0 , Cr on admission:1.28 - Strict I/O's: alert provider if UOP < 0.5 mL/kg/hr - gentle IVF hydration  - Daily BMP, replace electrolytes PRN - Avoid nephrotoxic agents as able, ensure adequate renal perfusion - consider renal US  Best practice (right click and "Reselect all SmartList Selections" daily)  Diet:  NPO Pain/Anxiety/Delirium protocol (if indicated): No VAP protocol (if indicated): Not indicated DVT prophylaxis: SCD * per primary GI prophylaxis: N/A* per primary Glucose control:  SSI Yes Central venous access:  N/A Arterial line:  N/A Foley:  N/A Mobility:  Per primary PT consulted: N/A Last date of multidisciplinary goals of care discussion 09/15/20 Code Status:  full code Disposition: PCU  Labs   CBC: Recent Labs  Lab 09/15/20 2052 09/16/20 0608 09/17/20 0405  WBC 15.5* 15.2* 15.2*  NEUTROABS  --  13.2*  --   HGB 14.7 14.8 12.7  HCT 43.6 44.2 36.7  MCV 88.6 89.5 88.2  PLT 293 246 [id number]    Basic Metabolic Panel: Recent Labs  Lab 09/15/20 2151 09/15/20 2259 09/16/20 0608 09/17/20 0405  NA 135 136 136 138  K 3.7 3.7 4.1 3.2*  CL 98 99 98 102  CO2 23 22 22 26   GLUCOSE 193* 175* 268* 169*  BUN 27* 27* 27* 26*  CREATININE 1.28* 1.24* 1.45* 0.96  CALCIUM 9.8 9.9 9.5 9.1  MG  --   --  1.5* 1.7  PHOS  --   --  3.4 2.7   GFR: Estimated  Creatinine Clearance: 48.9 mL/min (by C-G formula based on SCr of 0.96 mg/dL). Recent Labs  Lab 09/15/20 2052 09/16/20 0608 09/17/20 0405  PROCALCITON  --  0.84 2.60  WBC 15.5* 15.2* 15.2*    Liver Function Tests: Recent Labs  Lab 09/15/20 2151 09/15/20 2259 09/16/20 0608  AST 16 15 18   ALT 10 10 10   ALKPHOS 44 44 44  BILITOT 0.8 0.8 1.3*  PROT 7.5 7.4 7.2  ALBUMIN 3.6 3.7 3.3*   No results for input(s): LIPASE, AMYLASE in the last 168 hours. No results for input(s): AMMONIA in the last 168 hours.  ABG    Component Value Date/Time   PHART 7.36 09/16/2020 1003   PCO2ART 48 09/16/2020 1003   PO2ART 66 (L) 09/16/2020 1003   HCO3 27.1 09/16/2020 1003   O2SAT 91.9 09/16/2020 1003     Coagulation Profile: No results for input(s): INR, PROTIME in the last 168 hours.  Cardiac Enzymes: No results for input(s): CKTOTAL, CKMB, CKMBINDEX,  TROPONINI in the last 168 hours.  HbA1C: Hemoglobin A1C  Date/Time Value Ref Range Status  03/26/2012 06:17 AM 6.7 (H) 4.2 - 6.3 % Final    Comment:    The American Diabetes Association recommends that a primary goal of therapy should be <7% and that physicians should reevaluate the treatment regimen in patients with HbA1c values consistently >8%.    Hgb A1c MFr Bld  Date/Time Value Ref Range Status  07/24/2020 01:50 PM 14.2 (H) 4.8 - 5.6 % Final    Comment:    (NOTE) Pre diabetes:          5.7%-6.4%  Diabetes:              >6.4%  Glycemic control for   <7.0% adults with diabetes   01/10/2020 03:12 PM 9.2 (H) 4.8 - 5.6 % Final    Comment:    (NOTE)         Prediabetes: 5.7 - 6.4         Diabetes: >6.4         Glycemic control for adults with diabetes: <7.0     CBG: Recent Labs  Lab 09/16/20 1603 09/16/20 2052 09/17/20 0001 09/17/20 0438 09/17/20 0727  GLUCAP 239* 186* 166* 161* 152*    Review of Systems: Positives in BOLD  Gen: Denies fever, chills, weight change, fatigue, night sweats HEENT: Denies blurred  vision, double vision, hearing loss, tinnitus, sinus congestion, rhinorrhea, sore throat, neck stiffness, dysphagia PULM: Denies shortness of breath, cough, sputum production, hemoptysis, wheezing CV: Denies chest pain, edema, orthopnea, paroxysmal nocturnal dyspnea, palpitations GI: Denies abdominal pain, nausea, vomiting, diarrhea, hematochezia, melena, constipation, change in bowel habits GU: Denies dysuria, hematuria, polyuria, oliguria, urethral discharge Endocrine: Denies hot or cold intolerance, polyuria, polyphagia or appetite change Derm: Denies rash, dry skin, scaling or peeling skin change Heme: Denies easy bruising, bleeding, bleeding gums Neuro: Denies headache, numbness, weakness, slurred speech, loss of memory or consciousness Past Medical History:  She,  has a past medical history of Arthritis, Breast cancer ([place]), Cancer ([place]) (2004), Diabetes mellitus without complication ([place]), Hypercholesterolemia, Hypertension, Hypothyroid, and Seasonal allergies.   Surgical History:   Past Surgical History:  Procedure Laterality Date  . ABDOMINAL HYSTERECTOMY    . BREAST BIOPSY Left    neg  . BREAST EXCISIONAL BIOPSY Right 2004   breast ca and lumpectomy  . BREAST LUMPECTOMY Right 2004   breast ca  . CHOLECYSTECTOMY    . COLONOSCOPY WITH PROPOFOL N/A 02/13/2017   Procedure: COLONOSCOPY WITH PROPOFOL;  Surgeon: Jonathon Bellows, MD;  Location: [facility] ENDOSCOPY;  Service: Gastroenterology;  Laterality: N/A;  . KIDNEY STONE SURGERY       Social History:   reports that she has quit smoking. Her smoking use included cigarettes. She has never used smokeless tobacco. She reports that she does not drink alcohol and does not use drugs.   Family History:  Her family history is negative for Bladder Cancer and Kidney cancer.   Allergies Allergies  Allergen Reactions  . Navane [Thiothixene] Other (See Comments)    Reaction:  Unknown  . Penicillins Rash    Has patient had a PCN reaction  causing immediate rash, facial/tongue/throat swelling, SOB or lightheadedness with hypotension: No Has patient had a PCN reaction causing severe rash involving mucus membranes or skin necrosis: No Has patient had a PCN reaction that required hospitalization: No Has patient had a PCN reaction occurring within the last 10 years: No If all of the above answers  are "NO", then may proceed with Cephalosporin use.  Has patient had a PCN reaction causing immediate rash, facial/tongue/throat swelling, SOB or lightheadedness with hypotension: No Has patient had a PCN reaction causing severe rash involving mucus membranes or skin necrosis: No Has patient had a PCN reaction that required hospitalization: No Has patient had a PCN reaction occurring within the last 10 years: No If all of the above answers are "NO", then may proceed with Cephalosporin use. Has patient had a PCN reaction causing immediate rash, facial/tongue/throat swelling, SOB or lightheadedness with hypotension: No Has patient had a PCN reaction causing severe rash involving mucus membranes or skin necrosis: No Has patient had a PCN reaction that required hospitalization: No Has patient had a PCN reaction occurring within the last 10 years: No If all of the above answers are "NO", then may proceed with Cephalosporin use.     Home Medications  Prior to Admission medications   Medication Sig Start Date End Date Taking? Authorizing Provider  clonazePAM (KLONOPIN) 1 MG tablet Take 1 tablet (1 mg total) by mouth 2 (two) times daily. 08/24/20   Lorella Nimrod, MD  divalproex (DEPAKOTE ER) 500 MG 24 hr tablet Take 2 tablets (1,000 mg total) by mouth at bedtime. 01/11/20   Clapacs, Madie Reno, MD  enalapril (VASOTEC) 5 MG tablet Take 1 tablet (5 mg total) by mouth 2 (two) times daily. 01/11/20   Clapacs, Madie Reno, MD  FLUoxetine (PROZAC) 20 MG capsule Take 60 mg by mouth daily.    [provider]  insulin detemir (LEVEMIR) 100 UNIT/ML injection  Inject 0.15 mLs (15 Units total) into the skin daily. 07/31/20   Charlynne Cousins, MD  LATUDA 20 MG TABS tablet Take 20 mg by mouth daily. 08/17/20   [provider]  levothyroxine (SYNTHROID) 88 MCG tablet Take 88 mcg by mouth daily before breakfast.    [provider]  linagliptin (TRADJENTA) 5 MG TABS tablet Take 5 mg by mouth daily.    [provider]  loratadine (CLARITIN) 10 MG tablet Take 1 tablet (10 mg total) by mouth daily. 01/11/20   Clapacs, Madie Reno, MD  metFORMIN (GLUCOPHAGE) 500 MG tablet Take 2 tablets (1,000 mg total) by mouth 2 (two) times daily with a meal. 07/31/20   Charlynne Cousins, MD  ondansetron (ZOFRAN-ODT) 4 MG disintegrating tablet Take 4 mg by mouth every 6 (six) hours as needed for nausea. 08/16/20   [provider]  rosuvastatin (CRESTOR) 40 MG tablet Take 1 tablet (40 mg total) by mouth daily. 01/11/20   Clapacs, Madie Reno, MD       Critical care provider statement:    Critical care time (minutes):  33   Critical care time was exclusive of:  Separately billable procedures and  treating other patients   Critical care was necessary to treat or prevent imminent or  life-threatening deterioration of the following conditions:  FB aspiration, acute hypoxemic respiratory failure   Critical care was time spent personally by me on the following  activities:  Development of treatment plan with patient or surrogate,  discussions with consultants, evaluation of patient's response to  treatment, examination of patient, obtaining history from patient or  surrogate, ordering and performing treatments and interventions, ordering  and review of laboratory studies and re-evaluation of patient's condition   I assumed direction of critical care for this patient from another  provider in my specialty: no     Ottie Glazier, M.D.  Pulmonary & Critical Care  [place]

## 2020-09-17 NOTE — Progress Notes (Signed)
Patient extubated to 3L , Per MD order, with no complications. Sats are 95% on 3L at this time.

## 2020-09-17 NOTE — Plan of Care (Signed)
  Problem: Activity: Goal: Ability to tolerate increased activity will improve Outcome: Progressing   Problem: Clinical Measurements: Goal: Ability to maintain a body temperature in the normal range will improve Outcome: Progressing   Problem: Respiratory: Goal: Ability to maintain adequate ventilation will improve Outcome: Progressing Goal: Ability to maintain a clear airway will improve Outcome: Progressing   Problem: Education: Goal: Knowledge of General Education information will improve Description: Including pain rating scale, medication(s)/side effects and non-pharmacologic comfort measures Outcome: Progressing   Problem: Health Behavior/Discharge Planning: Goal: Ability to manage health-related needs will improve Outcome: Progressing   Problem: Clinical Measurements: Goal: Ability to maintain clinical measurements within normal limits will improve Outcome: Progressing Goal: Diagnostic test results will improve Outcome: Progressing Goal: Respiratory complications will improve Outcome: Progressing Goal: Cardiovascular complication will be avoided Outcome: Progressing   Problem: Activity: Goal: Risk for activity intolerance will decrease Outcome: Progressing   Problem: Elimination: Goal: Will not experience complications related to bowel motility Outcome: Progressing Goal: Will not experience complications related to urinary retention Outcome: Progressing   Problem: Safety: Goal: Ability to remain free from injury will improve Outcome: Progressing   Problem: Skin Integrity: Goal: Risk for impaired skin integrity will decrease Outcome: Progressing   

## 2020-09-18 ENCOUNTER — Inpatient Hospital Stay: Payer: Medicare Other

## 2020-09-18 DIAGNOSIS — R4182 Altered mental status, unspecified: Secondary | ICD-10-CM | POA: Diagnosis not present

## 2020-09-18 DIAGNOSIS — F3112 Bipolar disorder, current episode manic without psychotic features, moderate: Secondary | ICD-10-CM | POA: Diagnosis not present

## 2020-09-18 DIAGNOSIS — J9601 Acute respiratory failure with hypoxia: Secondary | ICD-10-CM | POA: Diagnosis not present

## 2020-09-18 DIAGNOSIS — E039 Hypothyroidism, unspecified: Secondary | ICD-10-CM | POA: Diagnosis not present

## 2020-09-18 DIAGNOSIS — N179 Acute kidney failure, unspecified: Secondary | ICD-10-CM

## 2020-09-18 DIAGNOSIS — J690 Pneumonitis due to inhalation of food and vomit: Secondary | ICD-10-CM | POA: Diagnosis not present

## 2020-09-18 DIAGNOSIS — E1169 Type 2 diabetes mellitus with other specified complication: Secondary | ICD-10-CM | POA: Diagnosis not present

## 2020-09-18 LAB — CULTURE, BAL-QUANTITATIVE W GRAM STAIN
Culture: 10000 — AB
Culture: 20000 — AB

## 2020-09-18 LAB — CBC WITH DIFFERENTIAL/PLATELET
Abs Immature Granulocytes: 0.26 10*3/uL — ABNORMAL HIGH (ref 0.00–0.07)
Basophils Absolute: 0.1 10*3/uL (ref 0.0–0.1)
Basophils Relative: 0 %
Eosinophils Absolute: 0 10*3/uL (ref 0.0–0.5)
Eosinophils Relative: 0 %
HCT: 34.5 % — ABNORMAL LOW (ref 36.0–46.0)
Hemoglobin: 11.7 g/dL — ABNORMAL LOW (ref 12.0–15.0)
Immature Granulocytes: 2 %
Lymphocytes Relative: 15 %
Lymphs Abs: 1.8 10*3/uL (ref 0.7–4.0)
MCH: 30 pg (ref 26.0–34.0)
MCHC: 33.9 g/dL (ref 30.0–36.0)
MCV: 88.5 fL (ref 80.0–100.0)
Monocytes Absolute: 0.7 10*3/uL (ref 0.1–1.0)
Monocytes Relative: 6 %
Neutro Abs: 9.8 10*3/uL — ABNORMAL HIGH (ref 1.7–7.7)
Neutrophils Relative %: 77 %
Platelets: 161 10*3/uL (ref 150–400)
RBC: 3.9 MIL/uL (ref 3.87–5.11)
RDW: 14.1 % (ref 11.5–15.5)
WBC: 12.6 10*3/uL — ABNORMAL HIGH (ref 4.0–10.5)
nRBC: 0 % (ref 0.0–0.2)

## 2020-09-18 LAB — RENAL FUNCTION PANEL
Albumin: 2.5 g/dL — ABNORMAL LOW (ref 3.5–5.0)
Anion gap: 7 (ref 5–15)
BUN: 16 mg/dL (ref 8–23)
CO2: 27 mmol/L (ref 22–32)
Calcium: 8.3 mg/dL — ABNORMAL LOW (ref 8.9–10.3)
Chloride: 103 mmol/L (ref 98–111)
Creatinine, Ser: 0.92 mg/dL (ref 0.44–1.00)
GFR, Estimated: >60 mL/min (ref >60)
Glucose, Bld: 136 mg/dL — ABNORMAL HIGH (ref 70–99)
Phosphorus: 2.5 mg/dL (ref 2.5–4.6)
Potassium: 4 mmol/L (ref 3.5–5.1)
Sodium: 137 mmol/L (ref 135–145)

## 2020-09-18 LAB — GLUCOSE, CAPILLARY
Glucose-Capillary: 127 mg/dL — ABNORMAL HIGH (ref 70–99)
Glucose-Capillary: 120 mg/dL — ABNORMAL HIGH (ref 70–99)
Glucose-Capillary: 118 mg/dL — ABNORMAL HIGH (ref 70–99)
Glucose-Capillary: 153 mg/dL — ABNORMAL HIGH (ref 70–99)
Glucose-Capillary: 98 mg/dL (ref 70–99)
Glucose-Capillary: 114 mg/dL — ABNORMAL HIGH (ref 70–99)

## 2020-09-18 LAB — HIV ANTIBODY (ROUTINE TESTING W REFLEX): HIV Screen 4th Generation wRfx: NONREACTIVE

## 2020-09-18 LAB — VITAMIN B12: Vitamin B-12: 295 pg/mL (ref 180–914)

## 2020-09-18 LAB — MAGNESIUM: Magnesium: 1.7 mg/dL (ref 1.7–2.4)

## 2020-09-18 LAB — PROCALCITONIN: Procalcitonin: 1.12 ng/mL

## 2020-09-18 IMAGING — MR MR HEAD W/O CM
11 series · 48 of 48 positions shown · non-contrast
Comparison: [DATE]

CLINICAL DATA: Delirium

EXAM:
MRI HEAD WITHOUT CONTRAST
TECHNIQUE: Multiplanar, multiecho pulse sequences of the brain and surrounding
structures were obtained without intravenous contrast.

[Series 5: ax dwi_tracew · axial · 3.0mm · 0.65mm/px · z∈[-71,+81]mm · 5 of 48 slices shown]
[im 1/48]
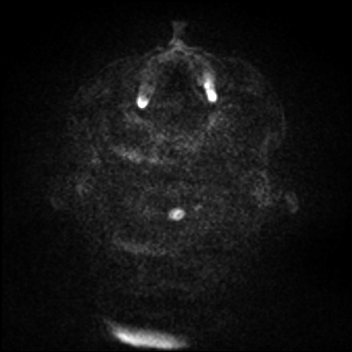
[im 12/48]
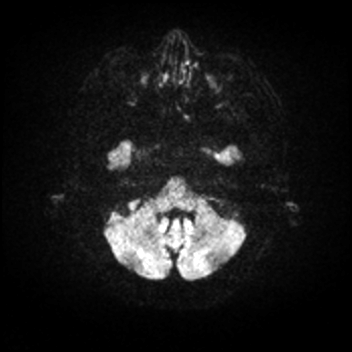
[im 24/48]
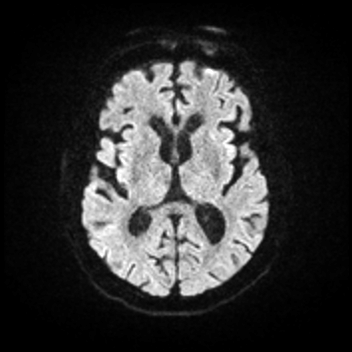
[im 36/48]
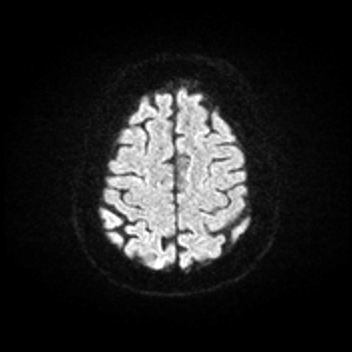
[im 48/48]
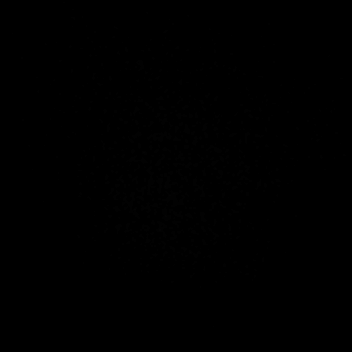

[Series 6: ax dwi_adc · axial · 3.0mm · 0.65mm/px · z∈[-71,+68]mm · 3 of 42 slices shown]
[im 1/42]
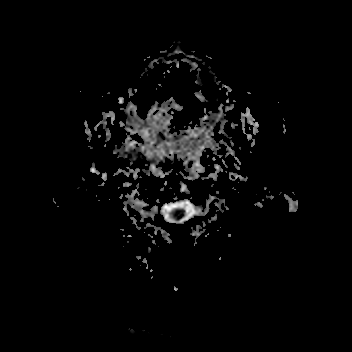
[im 21/42]
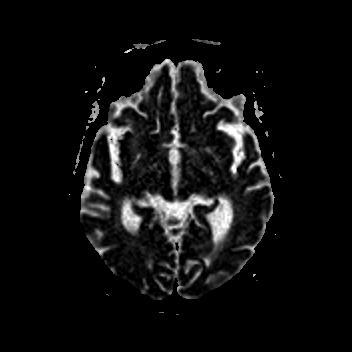
[im 42/42]
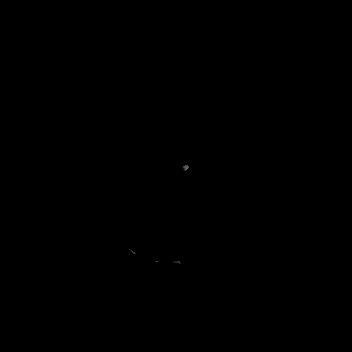

[Series 7: cor dwi_tracew · coronal · 5.0mm · 0.65mm/px · 3 of 40 slices shown]
[im 1/40]
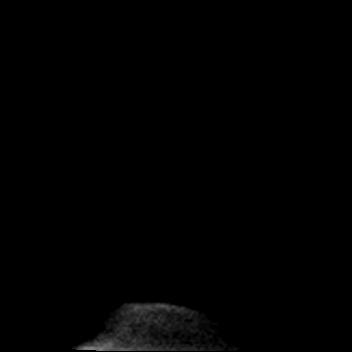
[im 20/40]
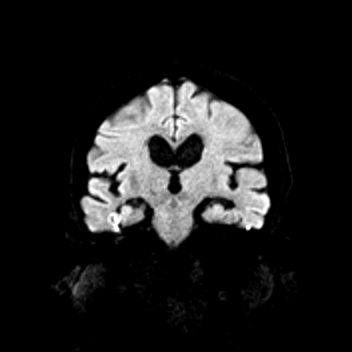
[im 40/40]
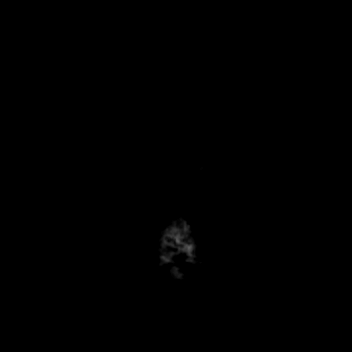

[Series 8: cor dwi_adc · coronal · 5.0mm · 0.65mm/px · 3 of 35 slices shown]
[im 1/35]
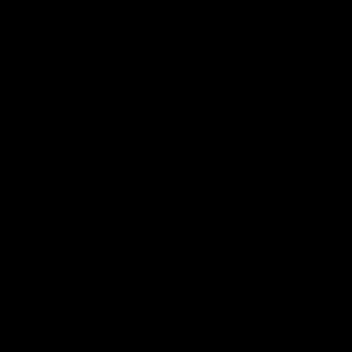
[im 18/35]
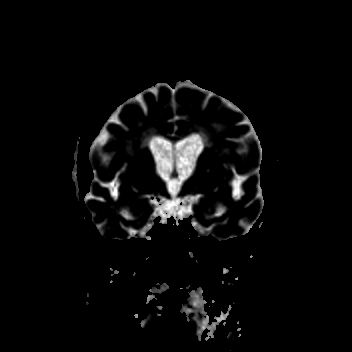
[im 35/35]
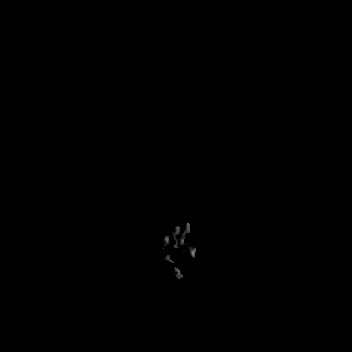

[Series 9: T1 · sagittal · 5.0mm · 0.62mm/px · 2 of 25 slices shown (1 of 2)]
[im 1/25]
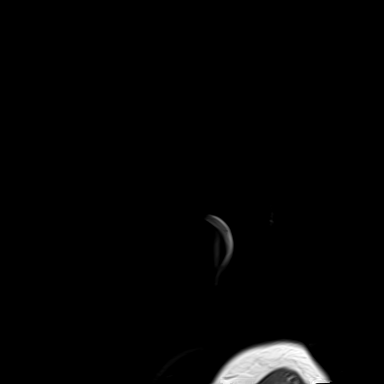
[im 25/25]
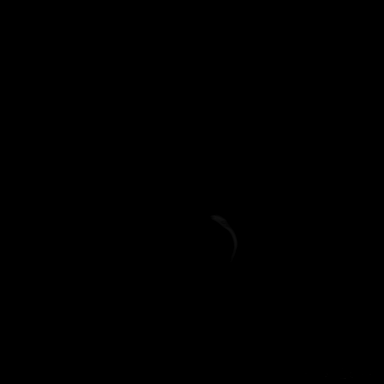

[Series 10: T2 · axial · 5.0mm · 0.53mm/px · z∈[-66,+75]mm · 2 of 25 slices shown (1 of 2)]
[im 1/25]
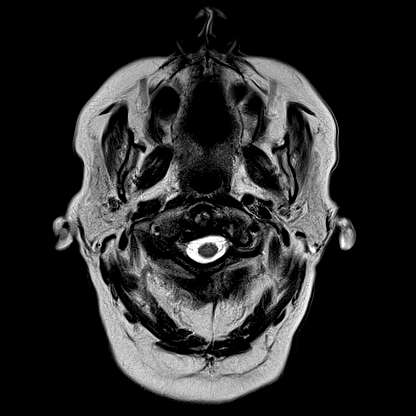
[im 25/25]
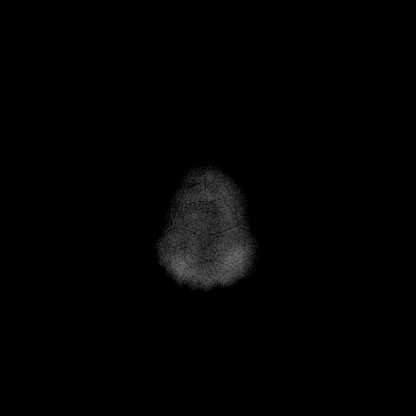

[Series 12: pha_images · axial · 3.0mm · 0.90mm/px · z∈[-78,+92]mm · 5 of 58 slices shown]
[im 1/58]
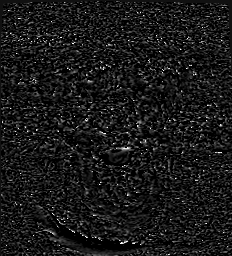
[im 15/58]
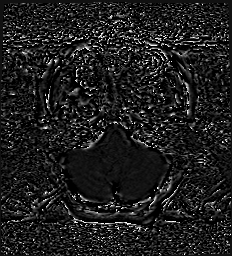
[im 29/58]
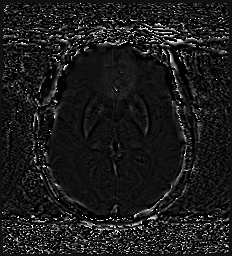
[im 43/58]
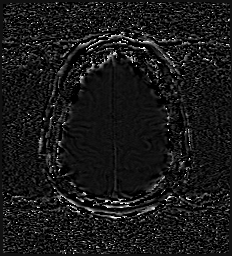
[im 58/58]
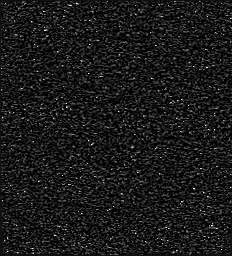

[Series 13: swi_images · axial · 3.0mm · 0.90mm/px · z∈[-81,+92]mm · 5 of 60 slices shown]
[im 1/60]
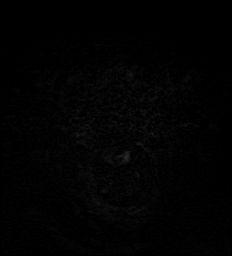
[im 15/60]
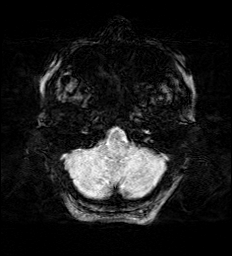
[im 30/60]
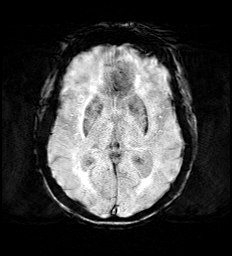
[im 45/60]
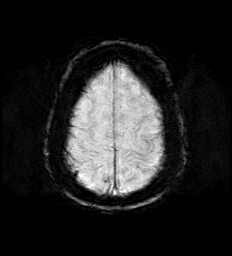
[im 60/60]
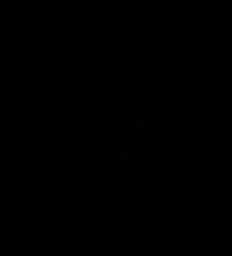

[Series 15: FLAIR · axial · 3.0mm · 0.53mm/px · z∈[-75,+84]mm · 4 of 55 slices shown]
[im 1/55]
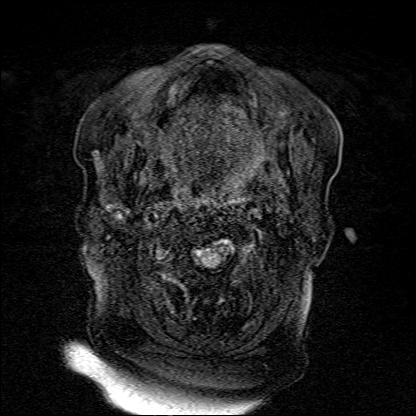
[im 19/55]
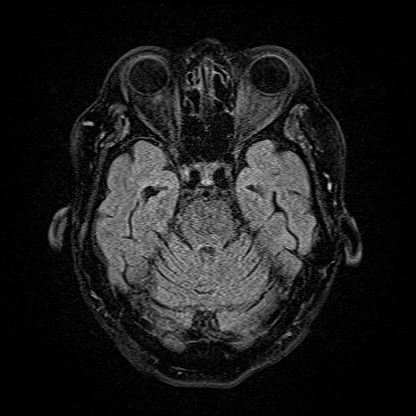
[im 37/55]
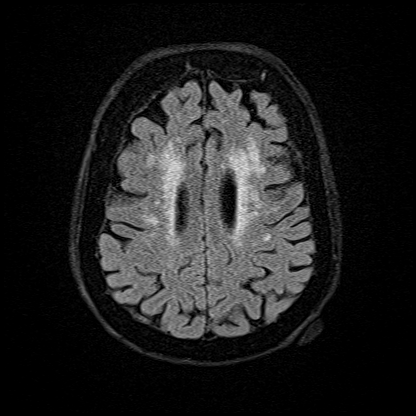
[im 55/55]
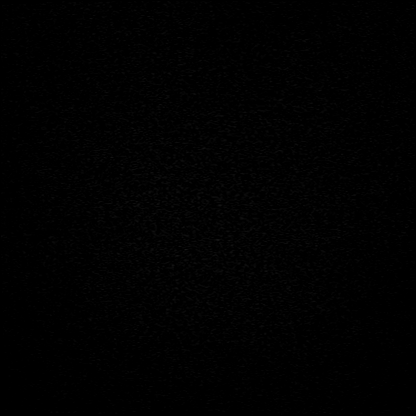

[Series 16: T1 · axial · 1.0mm · 0.98mm/px · z∈[-79,+92]mm · 14 of 176 slices shown (2 of 2)]
[im 1/176]
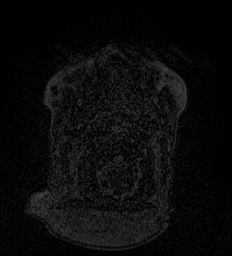
[im 14/176]
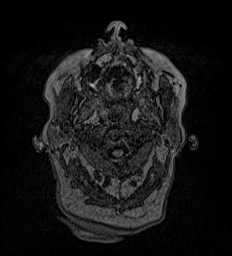
[im 27/176]
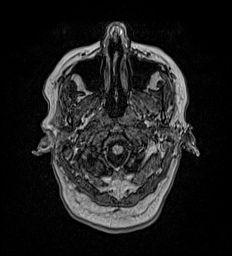
[im 41/176]
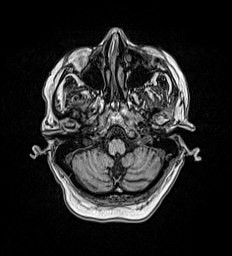
[im 54/176]
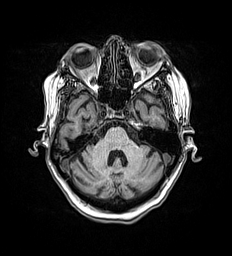
[im 68/176]
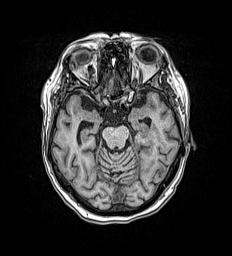
[im 81/176]
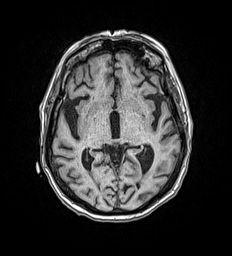
[im 95/176]
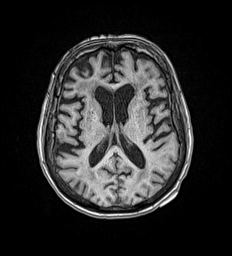
[im 108/176]
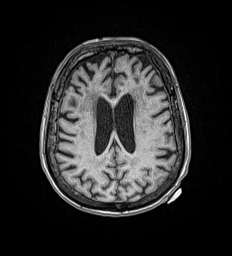
[im 122/176]
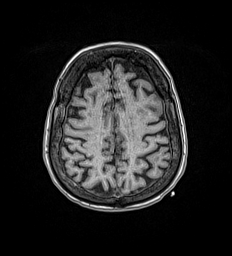
[im 135/176]
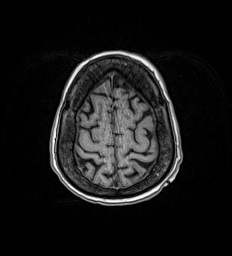
[im 149/176]
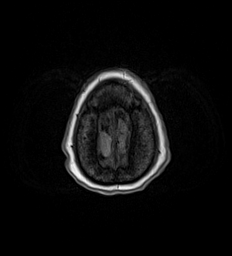
[im 162/176]
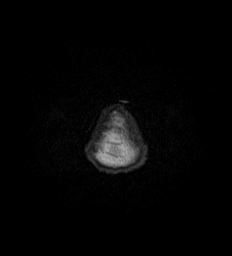
[im 176/176]
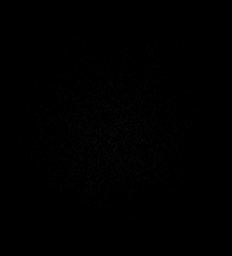

[Series 17: T2 · coronal · 5.0mm · 0.57mm/px · 2 of 29 slices shown (2 of 2)]
[im 1/29]
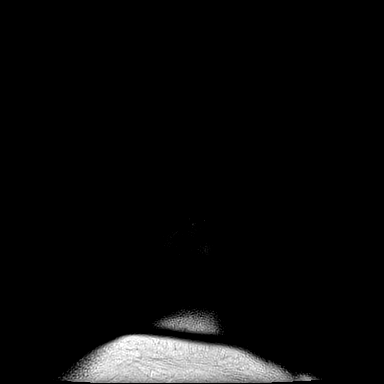
[im 29/29]
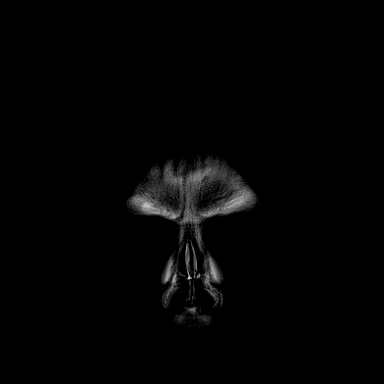

[48 of 48 positions shown; findings below may reference images not displayed]

FINDINGS: Brain: No acute infarct, mass effect or extra-axial collection. No
acute or chronic hemorrhage. Hyperintense T2-weighted signal is
moderately widespread throughout the white matter. Generalized
volume loss without a clear lobar predilection. The midline
structures are normal.

Vascular: Major flow voids are preserved.

Skull and upper cervical spine: Normal calvarium and skull base.
Visualized upper cervical spine and soft tissues are normal.

Sinuses/Orbits:No paranasal sinus fluid levels or advanced mucosal
thickening. No mastoid or middle ear effusion. Normal orbits.
IMPRESSION: 1. No acute intracranial abnormality.
2. Generalized volume loss and findings of chronic small vessel
disease.

## 2020-09-18 IMAGING — CT CT HEAD CODE STROKE
3 series · 15 of 46 positions shown, 18 images · non-contrast
Comparison: [DATE]

CLINICAL DATA: Code stroke.  Altered mental status

EXAM:
CT HEAD WITHOUT CONTRAST
TECHNIQUE: Contiguous axial images were obtained from the base of the skull
through the vertex without intravenous contrast.

[Series 3: head wo · axial · 0.39mm/px · z∈[+1076,+1196]mm · 9 of 29 slices shown, 12 images]
[im 3/29  brain]
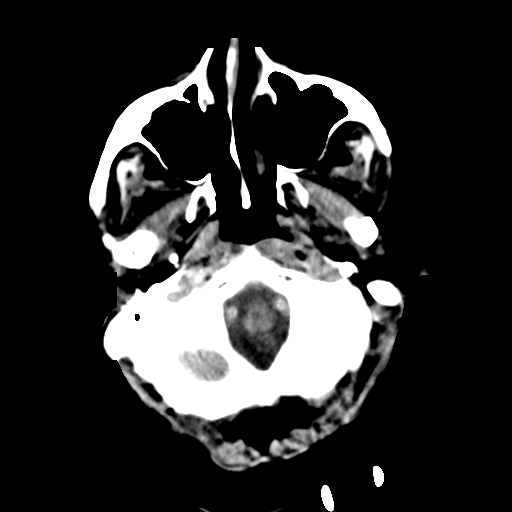
[im 3/29  bone]
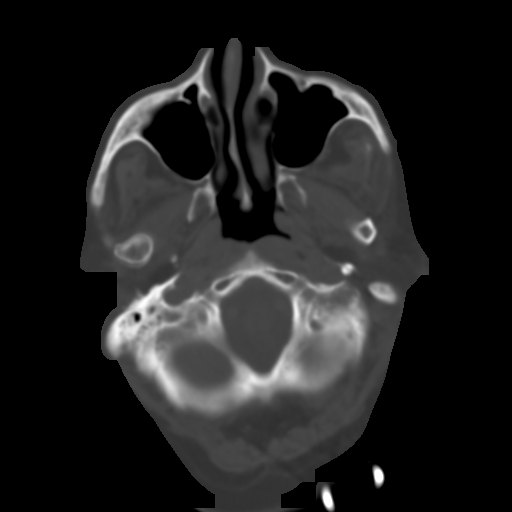
[im 6/29  brain]
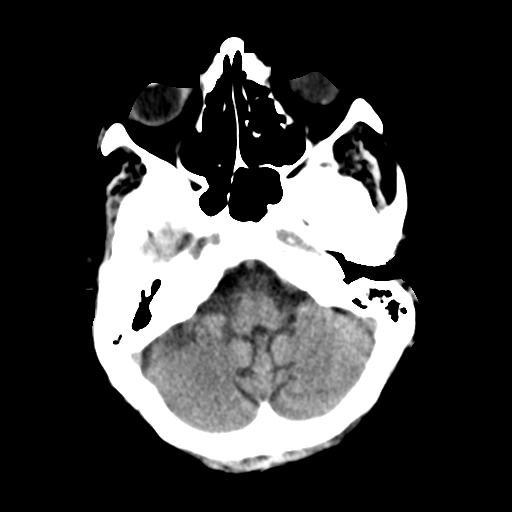
[im 9/29  brain]
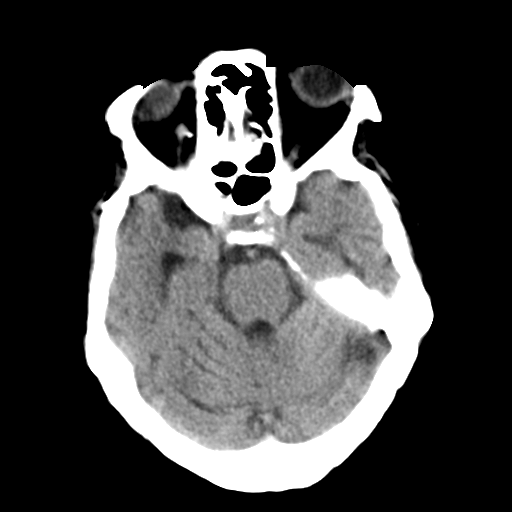
[im 12/29  brain]
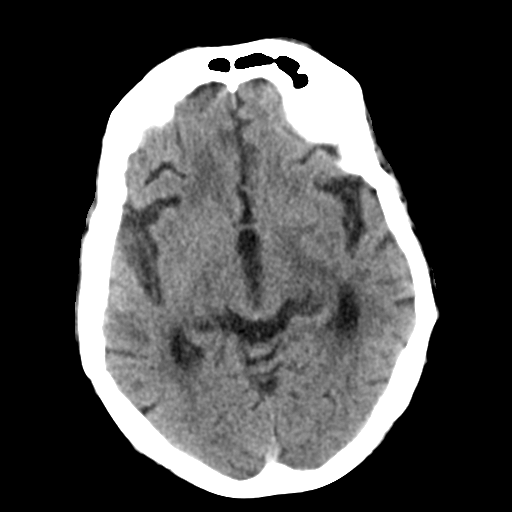
[im 15/29  brain]
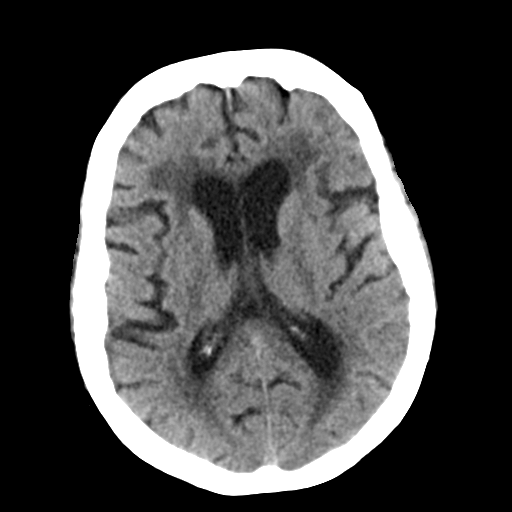
[im 15/29  bone]
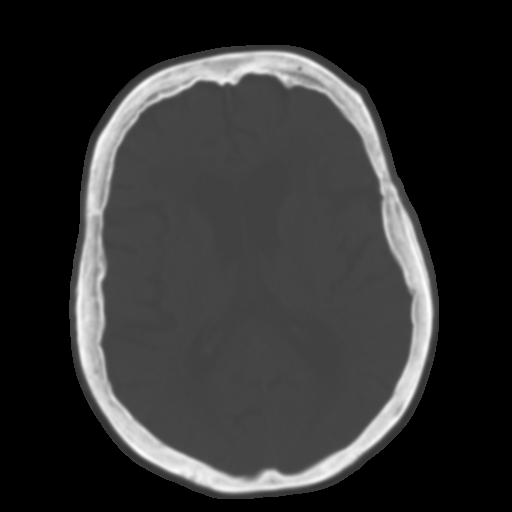
[im 18/29  brain]
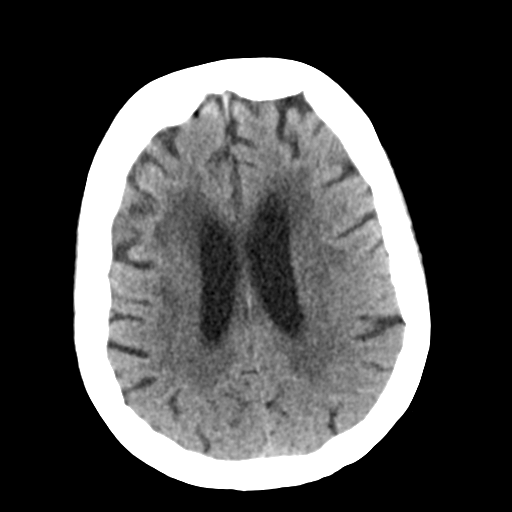
[im 21/29  brain]
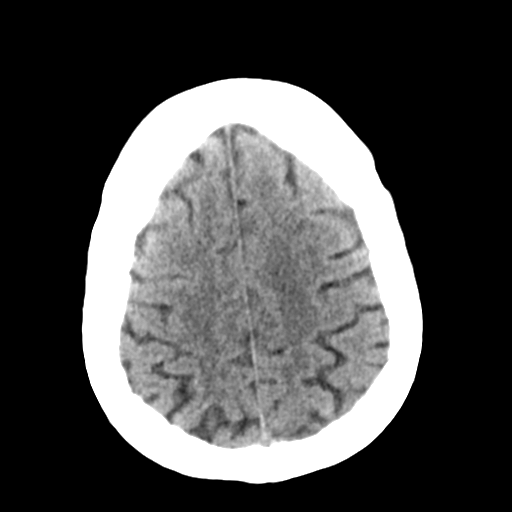
[im 24/29  brain]
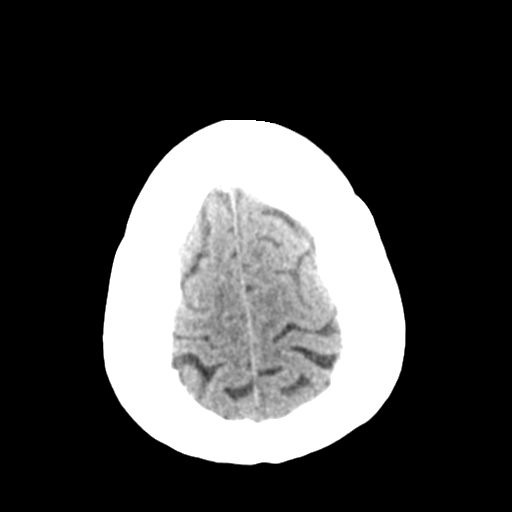
[im 27/29  brain]
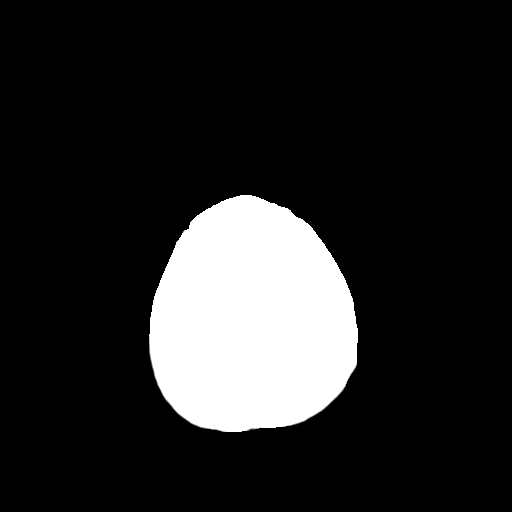
[im 27/29  bone]
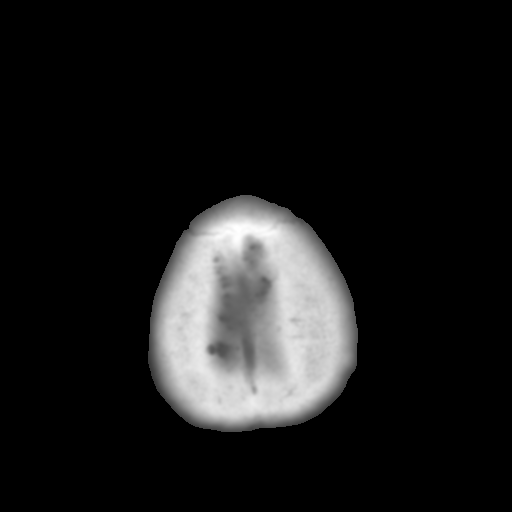

[Series 5: coronal soft tissue · coronal · 0.27mm/px · 3 of 70 slices shown]
[im 24/70  brain]
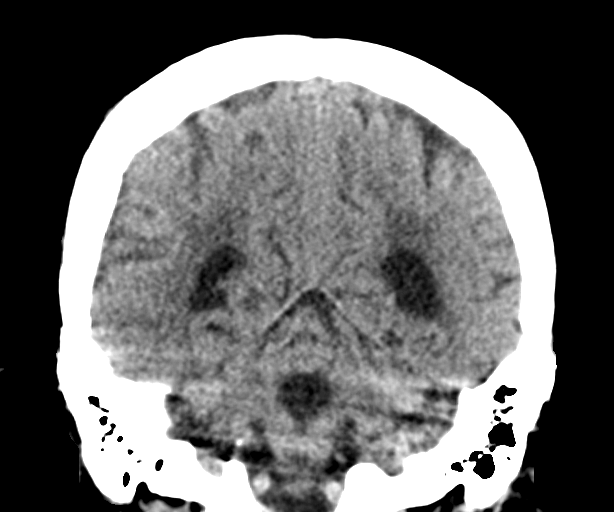
[im 31/70  brain]
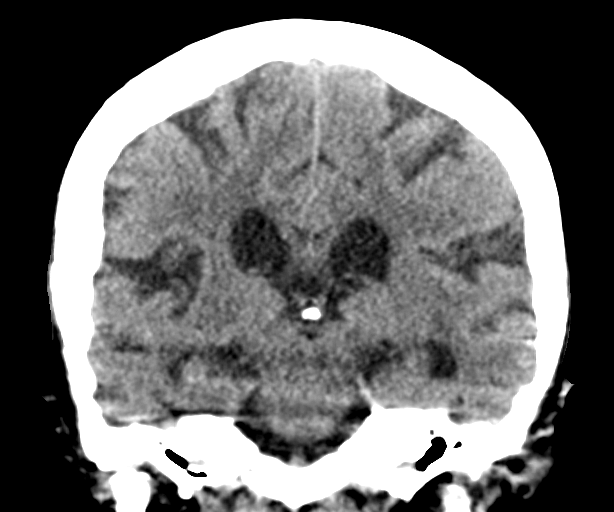
[im 39/70  brain]
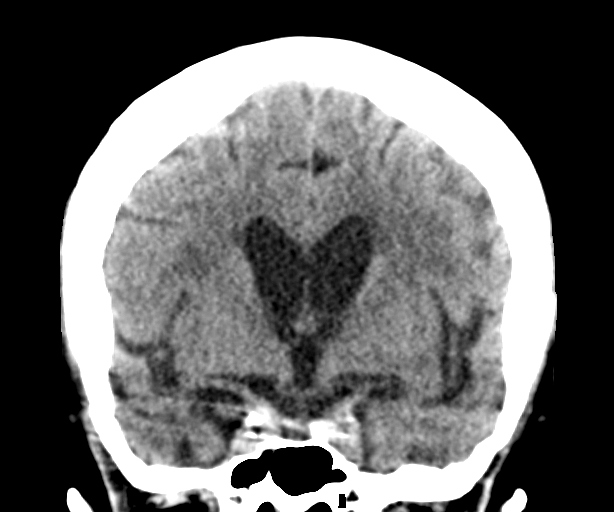

[Series 6: sagittal soft tissue · sagittal · 0.30mm/px · 3 of 56 slices shown]
[im 19/56  brain]
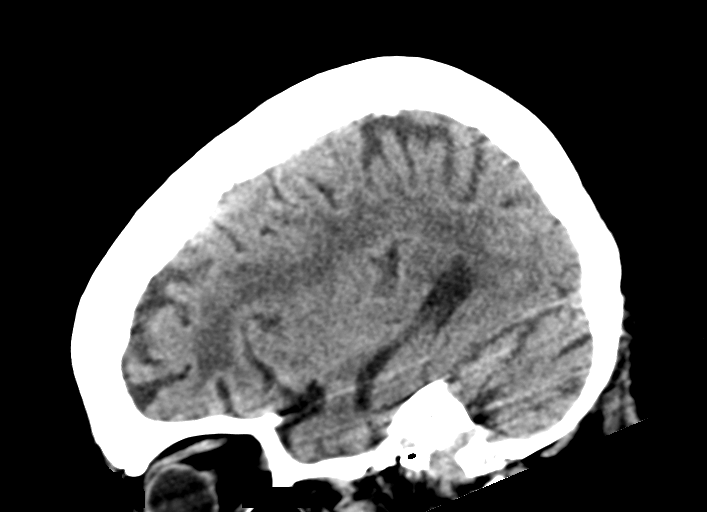
[im 28/56  brain]
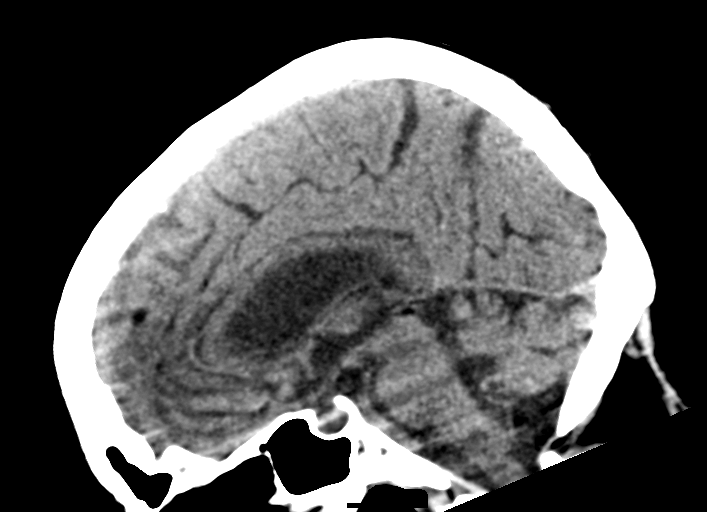
[im 37/56  brain]
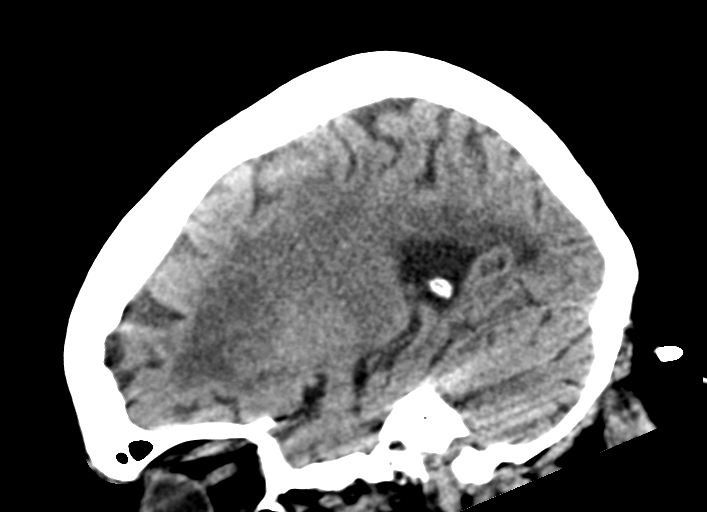

[15 of 46 positions shown; findings below may reference images not displayed]

FINDINGS: Brain: There is no acute intracranial hemorrhage, mass effect, or
edema. Gray-white differentiation remains preserved. Patchy and
confluent areas of hypoattenuation in the supratentorial white
matter nonspecific but probably reflect similar moderate to marked
chronic microvascular ischemic changes. Prominence of the ventricles
and sulci reflects similar generalized parenchymal volume loss. No
extra-axial collection.

Vascular: No hyperdense vessel or unexpected calcification.

Skull: Unremarkable.

Sinuses/Orbits: No acute finding.

Other: Mastoid air cells are clear.

ASPECTS ([place] Stroke Program Early CT Score)

- Ganglionic level infarction (caudate, lentiform nuclei, internal
capsule, insula, M1-M3 cortex): 7

- Supraganglionic infarction (M4-M6 cortex): 3

Total score (0-10 with 10 being normal): 10
IMPRESSION: There is no acute intracranial hemorrhage or evidence of acute
infarction. ASPECT score is 10.

Stable chronic/nonemergent findings detailed above.

These results were communicated to at [DATE] pmon [DATE]by text page
via the [employer] messaging system.

## 2020-09-18 MED ORDER — ALBUTEROL SULFATE (2.5 MG/3ML) 0.083% IN NEBU: 2.5000 mg | INHALATION_SOLUTION | RESPIRATORY_TRACT | Status: DC | PRN

## 2020-09-18 MED ORDER — DIVALPROEX SODIUM 125 MG PO CSDR
500.0000 mg | DELAYED_RELEASE_CAPSULE | Freq: Two times a day (BID) | ORAL | Status: DC
  Administered 2020-09-18 – 2020-10-02 (×28): 500 mg via ORAL
  Filled 2020-09-18 (×29): qty 4

## 2020-09-18 MED ORDER — MAGNESIUM SULFATE 2 GM/50ML IV SOLN
2.0000 g | Freq: Once | INTRAVENOUS | Status: AC
  Administered 2020-09-18: 2 g via INTRAVENOUS
  Filled 2020-09-18: qty 50

## 2020-09-18 NOTE — Consult Note (Signed)
[facility] Consult   Reason for Consult: Consult for 75 year old woman well known to [place] who has a history of bipolar disorder.  Currently in the ICU recovering from respiratory problems. Referring Physician: Earleen Newport Patient Identification: [name] MRN:  [id number] Principal Diagnosis: Bipolar 1 disorder with moderate mania ([place]) Diagnosis:  Principal Problem:   Bipolar 1 disorder with moderate mania ([place]) Active Problems:   Essential hypertension   Hypothyroidism   Dyslipidemia   Type 2 diabetes mellitus with hyperlipidemia ([place])   AKI (acute kidney injury) ([place])   Aspiration pneumonia ([place])   Acute respiratory failure with hypoxia ([place])   Acute hypoxemic respiratory failure ([place])   Total Time spent with patient: 1 hour  Subjective:   [name] is a 75 y.o. female patient admitted with "it was worse than choking".  HPI: Patient seen chart reviewed.  Patient well-known from many previous encounters.  75 year old woman with chronic mental health problems.  She was brought to the hospital this time after choking on food resulting in what sounds like an extended period of partial hypoxia.  In the hospital she is required varying degrees of oxygen support and ultimately had to have a bronchoscopy to remove some foreign bodies.  Developed aspiration pneumonia.  Now seems to finally be recovering.  Concern evidently was raised about possible delirium.  I came to see patient midday found her awake and cooperative.  She recognized me and was appropriate in conversation.  She certainly did seem fatigued and slowed compared to her normal baseline but she was oriented to the hospital a year and the situation.  She showed some word finding areas possibly consistent with sedation or may be some neurologic injury.  I did not do a specific full neuro exam but some of the movements in her face look different to me than her usual baseline as well.  Nevertheless she  was ultimately able to tell me the name of the hospital and tell me that she had choked while eating hot dogs but that it was worse than choking because she had sucked of the food down into her lungs.  Patient was attentive throughout the conversation.  No sign of hallucinating or responding to internal stimuli.  It looks like right now she has been maintained on an approximation of her normal outpatient medicine although she is also being given clonazepam which she has been tapered off as an outpatient  Past Psychiatric History: Long history of bipolar disorder multiple hospitalizations.  Often comes in with mixed features of sadness and agitation.  Has done well on her current combination of medicine of modest dose Latuda Depakote and sometimes antidepressants.  Risk to Self:   Risk to Others:   Prior Inpatient Therapy:   Prior Outpatient Therapy:    Past Medical History:  Past Medical History:  Diagnosis Date  . Arthritis   . Breast cancer ([place])   . Cancer [facility]) 2004   right breast ca  . Diabetes mellitus without complication ([place])   . Hypercholesterolemia   . Hypertension   . Hypothyroid   . Seasonal allergies     Past Surgical History:  Procedure Laterality Date  . ABDOMINAL HYSTERECTOMY    . BREAST BIOPSY Left    neg  . BREAST EXCISIONAL BIOPSY Right 2004   breast ca and lumpectomy  . BREAST LUMPECTOMY Right 2004   breast ca  . CHOLECYSTECTOMY    . COLONOSCOPY WITH PROPOFOL N/A 02/13/2017   Procedure: COLONOSCOPY WITH PROPOFOL;  Surgeon: Jonathon Bellows, MD;  Location: [facility] ENDOSCOPY;  Service: Gastroenterology;  Laterality: N/A;  . KIDNEY STONE SURGERY     Family History:  Family History  Problem Relation Age of Onset  . Bladder Cancer Neg Hx   . Kidney cancer Neg Hx    Family Psychiatric  History: See previous.  None reported Social History:  Social History   Substance and Sexual Activity  Alcohol Use No     Social History   Substance and Sexual Activity  Drug  Use No    Social History   Socioeconomic History  . Marital status: Divorced    Spouse name: Not on file  . Number of children: Not on file  . Years of education: Not on file  . Highest education level: Not on file  Occupational History  . Not on file  Tobacco Use  . Smoking status: Former Smoker    Types: Cigarettes  . Smokeless tobacco: Never Used  Vaping Use  . Vaping Use: Never used  Substance and Sexual Activity  . Alcohol use: No  . Drug use: No  . Sexual activity: Not Currently  Other Topics Concern  . Not on file  Social History Narrative  . Not on file   Social Determinants of Health   Financial Resource Strain: Not on file  Food Insecurity: Not on file  Transportation Needs: Not on file  Physical Activity: Not on file  Stress: Not on file  Social Connections: Not on file   Additional Social History:    Allergies:   Allergies  Allergen Reactions  . Navane [Thiothixene] Other (See Comments)    Reaction:  Unknown  . Penicillins Rash    Has patient had a PCN reaction causing immediate rash, facial/tongue/throat swelling, SOB or lightheadedness with hypotension: No Has patient had a PCN reaction causing severe rash involving mucus membranes or skin necrosis: No Has patient had a PCN reaction that required hospitalization: No Has patient had a PCN reaction occurring within the last 10 years: No If all of the above answers are "NO", then may proceed with Cephalosporin use.  Has patient had a PCN reaction causing immediate rash, facial/tongue/throat swelling, SOB or lightheadedness with hypotension: No Has patient had a PCN reaction causing severe rash involving mucus membranes or skin necrosis: No Has patient had a PCN reaction that required hospitalization: No Has patient had a PCN reaction occurring within the last 10 years: No If all of the above answers are "NO", then may proceed with Cephalosporin use. Has patient had a PCN reaction causing immediate  rash, facial/tongue/throat swelling, SOB or lightheadedness with hypotension: No Has patient had a PCN reaction causing severe rash involving mucus membranes or skin necrosis: No Has patient had a PCN reaction that required hospitalization: No Has patient had a PCN reaction occurring within the last 10 years: No If all of the above answers are "NO", then may proceed with Cephalosporin use.    Labs:  Results for orders placed or performed during the hospital encounter of 09/15/20 (from the past 48 hour(s))  Glucose, capillary     Status: Abnormal   Collection Time: 09/16/20  8:52 PM  Result Value Ref Range   Glucose-Capillary 186 (H) 70 - 99 mg/dL    Comment: Glucose reference range applies only to samples taken after fasting for at least 8 hours.  Glucose, capillary     Status: Abnormal   Collection Time: 09/17/20 12:01 AM  Result Value Ref Range   Glucose-Capillary 166 (  H) 70 - 99 mg/dL    Comment: Glucose reference range applies only to samples taken after fasting for at least 8 hours.  Procalcitonin     Status: None   Collection Time: 09/17/20  4:05 AM  Result Value Ref Range   Procalcitonin 2.60 ng/mL    Comment:        Interpretation: PCT > 2 ng/mL: Systemic infection (sepsis) is likely, unless other causes are known. (NOTE)       Sepsis PCT Algorithm           Lower Respiratory Tract                                      Infection PCT Algorithm    ----------------------------     ----------------------------         PCT < 0.25 ng/mL                PCT < 0.10 ng/mL          Strongly encourage             Strongly discourage   discontinuation of antibiotics    initiation of antibiotics    ----------------------------     -----------------------------       PCT 0.25 - 0.50 ng/mL            PCT 0.10 - 0.25 ng/mL               OR       >80% decrease in PCT            Discourage initiation of                                            antibiotics      Encourage  discontinuation           of antibiotics    ----------------------------     -----------------------------         PCT >= 0.50 ng/mL              PCT 0.26 - 0.50 ng/mL               AND       <80% decrease in PCT              Encourage initiation of                                             antibiotics       Encourage continuation           of antibiotics    ----------------------------     -----------------------------        PCT >= 0.50 ng/mL                  PCT > 0.50 ng/mL               AND         increase in PCT                  Strongly encourage  initiation of antibiotics    Strongly encourage escalation           of antibiotics                                     -----------------------------                                           PCT <= 0.25 ng/mL                                                 OR                                        > 80% decrease in PCT                                      Discontinue / Do not initiate                                             antibiotics  Performed at [facility], [address] [place], [place] [address]   CBC     Status: Abnormal   Collection Time: 09/17/20  4:05 AM  Result Value Ref Range   WBC 15.2 (H) 4.0 - 10.5 K/uL   RBC 4.16 3.87 - 5.11 MIL/uL   Hemoglobin 12.7 12.0 - 15.0 g/dL   HCT 36.7 36.0 - 46.0 %   MCV 88.2 80.0 - 100.0 fL   MCH 30.5 26.0 - 34.0 pg   MCHC 34.6 30.0 - 36.0 g/dL   RDW 13.7 11.5 - 15.5 %   Platelets 218 150 - 400 K/uL   nRBC 0.0 0.0 - 0.2 %    Comment: Performed at [facility], [address] [place], [place] [address]  Basic metabolic panel     Status: Abnormal   Collection Time: 09/17/20  4:05 AM  Result Value Ref Range   Sodium 138 135 - 145 mmol/L   Potassium 3.2 (L) 3.5 - 5.1 mmol/L   Chloride 102 98 - 111 mmol/L   CO2 26 22 - 32 mmol/L   Glucose, Bld 169 (H) 70 - 99 mg/dL    Comment: Glucose reference range applies only  to samples taken after fasting for at least 8 hours.   BUN 26 (H) 8 - 23 mg/dL   Creatinine, Ser 0.96 0.44 - 1.00 mg/dL   Calcium 9.1 8.9 - 10.3 mg/dL   GFR, Estimated >60 >60 mL/min    Comment: (NOTE) Calculated using the CKD-EPI Creatinine Equation (2021)    Anion gap 10 5 - 15    Comment: Performed at [facility], [address] [place], [place] [address]  Valproic acid level     Status: Abnormal   Collection Time: 09/17/20  4:05 AM  Result Value Ref Range   Valproic Acid Lvl <  10 (L) 50.0 - 100.0 ug/mL    Comment: Performed at [facility], [address] [place], [place] [address]  Magnesium     Status: None   Collection Time: 09/17/20  4:05 AM  Result Value Ref Range   Magnesium 1.7 1.7 - 2.4 mg/dL    Comment: Performed at [facility], [address] [place], [place] [address]  Phosphorus     Status: None   Collection Time: 09/17/20  4:05 AM  Result Value Ref Range   Phosphorus 2.7 2.5 - 4.6 mg/dL    Comment: Performed at [facility], [address] [place], [place] [address]  Glucose, capillary     Status: Abnormal   Collection Time: 09/17/20  4:38 AM  Result Value Ref Range   Glucose-Capillary 161 (H) 70 - 99 mg/dL    Comment: Glucose reference range applies only to samples taken after fasting for at least 8 hours.  Glucose, capillary     Status: Abnormal   Collection Time: 09/17/20  7:27 AM  Result Value Ref Range   Glucose-Capillary 152 (H) 70 - 99 mg/dL    Comment: Glucose reference range applies only to samples taken after fasting for at least 8 hours.  Glucose, capillary     Status: Abnormal   Collection Time: 09/17/20 11:05 AM  Result Value Ref Range   Glucose-Capillary 168 (H) 70 - 99 mg/dL    Comment: Glucose reference range applies only to samples taken after fasting for at least 8 hours.  Glucose, capillary     Status: Abnormal   Collection Time: 09/17/20  3:36 PM  Result Value Ref Range   Glucose-Capillary 135  (H) 70 - 99 mg/dL    Comment: Glucose reference range applies only to samples taken after fasting for at least 8 hours.  Glucose, capillary     Status: Abnormal   Collection Time: 09/17/20  7:13 PM  Result Value Ref Range   Glucose-Capillary 151 (H) 70 - 99 mg/dL    Comment: Glucose reference range applies only to samples taken after fasting for at least 8 hours.   Comment 1 Notify RN    Comment 2 Document in Chart   Glucose, capillary     Status: Abnormal   Collection Time: 09/17/20 11:37 PM  Result Value Ref Range   Glucose-Capillary 146 (H) 70 - 99 mg/dL    Comment: Glucose reference range applies only to samples taken after fasting for at least 8 hours.   Comment 1 Notify RN    Comment 2 Document in Chart   Glucose, capillary     Status: Abnormal   Collection Time: 09/18/20  3:21 AM  Result Value Ref Range   Glucose-Capillary 127 (H) 70 - 99 mg/dL    Comment: Glucose reference range applies only to samples taken after fasting for at least 8 hours.   Comment 1 Notify RN    Comment 2 Document in Chart   Procalcitonin     Status: None   Collection Time: 09/18/20  5:48 AM  Result Value Ref Range   Procalcitonin 1.12 ng/mL    Comment:        Interpretation: PCT > 0.5 ng/mL and <= 2 ng/mL: Systemic infection (sepsis) is possible, but other conditions are known to elevate PCT as well. (NOTE)       Sepsis PCT Algorithm           Lower Respiratory Tract  Infection PCT Algorithm    ----------------------------     ----------------------------         PCT < 0.25 ng/mL                PCT < 0.10 ng/mL          Strongly encourage             Strongly discourage   discontinuation of antibiotics    initiation of antibiotics    ----------------------------     -----------------------------       PCT 0.25 - 0.50 ng/mL            PCT 0.10 - 0.25 ng/mL               OR       >80% decrease in PCT            Discourage initiation of                                             antibiotics      Encourage discontinuation           of antibiotics    ----------------------------     -----------------------------         PCT >= 0.50 ng/mL              PCT 0.26 - 0.50 ng/mL                AND       <80% decrease in PCT             Encourage initiation of                                             antibiotics       Encourage continuation           of antibiotics    ----------------------------     -----------------------------        PCT >= 0.50 ng/mL                  PCT > 0.50 ng/mL               AND         increase in PCT                  Strongly encourage                                      initiation of antibiotics    Strongly encourage escalation           of antibiotics                                     -----------------------------                                           PCT <= 0.25 ng/mL  OR                                        > 80% decrease in PCT                                      Discontinue / Do not initiate                                             antibiotics  Performed at [facility], [address] [place], [place] [address]   CBC with Differential/Platelet     Status: Abnormal   Collection Time: 09/18/20  5:48 AM  Result Value Ref Range   WBC 12.6 (H) 4.0 - 10.5 K/uL   RBC 3.90 3.87 - 5.11 MIL/uL   Hemoglobin 11.7 (L) 12.0 - 15.0 g/dL   HCT 34.5 (L) 36.0 - 46.0 %   MCV 88.5 80.0 - 100.0 fL   MCH 30.0 26.0 - 34.0 pg   MCHC 33.9 30.0 - 36.0 g/dL   RDW 14.1 11.5 - 15.5 %   Platelets 161 150 - 400 K/uL   nRBC 0.0 0.0 - 0.2 %   Neutrophils Relative % 77 %   Neutro Abs 9.8 (H) 1.7 - 7.7 K/uL   Lymphocytes Relative 15 %   Lymphs Abs 1.8 0.7 - 4.0 K/uL   Monocytes Relative 6 %   Monocytes Absolute 0.7 0.1 - 1.0 K/uL   Eosinophils Relative 0 %   Eosinophils Absolute 0.0 0.0 - 0.5 K/uL   Basophils Relative 0 %   Basophils Absolute 0.1 0.0 - 0.1  K/uL   Immature Granulocytes 2 %   Abs Immature Granulocytes 0.26 (H) 0.00 - 0.07 K/uL    Comment: Performed at [facility], [address] [place], [place] [address]  Renal function panel     Status: Abnormal   Collection Time: 09/18/20  5:48 AM  Result Value Ref Range   Sodium 137 135 - 145 mmol/L   Potassium 4.0 3.5 - 5.1 mmol/L   Chloride 103 98 - 111 mmol/L   CO2 27 22 - 32 mmol/L   Glucose, Bld 136 (H) 70 - 99 mg/dL    Comment: Glucose reference range applies only to samples taken after fasting for at least 8 hours.   BUN 16 8 - 23 mg/dL   Creatinine, Ser 0.92 0.44 - 1.00 mg/dL   Calcium 8.3 (L) 8.9 - 10.3 mg/dL   Phosphorus 2.5 2.5 - 4.6 mg/dL   Albumin 2.5 (L) 3.5 - 5.0 g/dL   GFR, Estimated >60 >60 mL/min    Comment: (NOTE) Calculated using the CKD-EPI Creatinine Equation (2021)    Anion gap 7 5 - 15    Comment: Performed at [facility], [address], [place], [place] [address]  Magnesium     Status: None   Collection Time: 09/18/20  5:48 AM  Result Value Ref Range   Magnesium 1.7 1.7 - 2.4 mg/dL    Comment: Performed at [facility], [address] [place], [place] [address]  Glucose, capillary     Status: Abnormal   Collection Time: 09/18/20  7:17 AM  Result Value Ref  Range   Glucose-Capillary 120 (H) 70 - 99 mg/dL    Comment: Glucose reference range applies only to samples taken after fasting for at least 8 hours.  Glucose, capillary     Status: Abnormal   Collection Time: 09/18/20 11:11 AM  Result Value Ref Range   Glucose-Capillary 118 (H) 70 - 99 mg/dL    Comment: Glucose reference range applies only to samples taken after fasting for at least 8 hours.  Glucose, capillary     Status: Abnormal   Collection Time: 09/18/20  3:33 PM  Result Value Ref Range   Glucose-Capillary 153 (H) 70 - 99 mg/dL    Comment: Glucose reference range applies only to samples taken after fasting for at least 8 hours.  Glucose, capillary      Status: None   Collection Time: 09/18/20  7:11 PM  Result Value Ref Range   Glucose-Capillary 98 70 - 99 mg/dL    Comment: Glucose reference range applies only to samples taken after fasting for at least 8 hours.    Current Facility-Administered Medications  Medication Dose Route Frequency Provider Last Rate Last Admin  . 0.9 %  sodium chloride infusion  250 mL Intravenous Continuous Rust-Chester, Britton L, NP   Stopped at 09/18/20 1238  . albuterol (PROVENTIL) (2.5 MG/3ML) 0.083% nebulizer solution 2.5 mg  2.5 mg Nebulization Q4H PRN Awilda Bill, NP      . cefTRIAXone (ROCEPHIN) 2 g in sodium chloride 0.9 % 100 mL IVPB  2 g Intravenous Q24H Benita Gutter, [facility]   Stopped at 09/18/20 1756  . chlorhexidine gluconate (MEDLINE KIT) (PERIDEX) 0.12 % solution 15 mL  15 mL Mouth Rinse BID Ottie Glazier, MD   15 mL at 09/18/20 0825  . Chlorhexidine Gluconate Cloth 2 % PADS 6 each  6 each Topical Q2200 Ottie Glazier, MD   6 each at 09/17/20 1600  . clonazePAM (KLONOPIN) tablet 0.5 mg  0.5 mg Oral BID Rust-Chester, Britton L, NP      . divalproex (DEPAKOTE SPRINKLE) capsule 500 mg  500 mg Oral Q12H Dallie Piles, RPH      . enoxaparin (LOVENOX) injection 40 mg  40 mg Subcutaneous Q24H Loletha Grayer, MD   40 mg at 09/17/20 2156  . FLUoxetine (PROZAC) capsule 60 mg  60 mg Oral Daily Loletha Grayer, MD   60 mg at 09/17/20 0946  . insulin aspart (novoLOG) injection 0-15 Units  0-15 Units Subcutaneous Q4H Rust-Chester, Britton L, NP   3 Units at 09/18/20 1603  . levothyroxine (SYNTHROID) tablet 88 mcg  88 mcg Oral QAC breakfast Rust-Chester, Britton L, NP      . lurasidone (LATUDA) tablet 20 mg  20 mg Oral Daily Rust-Chester, Britton L, NP      . metroNIDAZOLE (FLAGYL) IVPB 500 mg  500 mg Intravenous Q8H Benita Gutter, RPH   Stopped at 09/18/20 1212  . pantoprazole (PROTONIX) injection 40 mg  40 mg Intravenous Q24H Loletha Grayer, MD   40 mg at 09/18/20 1603     Musculoskeletal: Strength & Muscle Tone: decreased Gait & Station: unsteady Patient leans: N/A            Psychiatric Specialty Exam:  Presentation  General Appearance: No data recorded Eye Contact:No data recorded Speech:No data recorded Speech Volume:No data recorded Handedness:No data recorded  Mood and Affect  Mood:No data recorded Affect:No data recorded  Thought Process  Thought Processes:No data recorded Descriptions of Associations:No data recorded Orientation:No data recorded Thought Content:No  data recorded History of Schizophrenia/Schizoaffective disorder:No data recorded Duration of Psychotic Symptoms:No data recorded Hallucinations:No data recorded Ideas of Reference:No data recorded Suicidal Thoughts:No data recorded Homicidal Thoughts:No data recorded  Sensorium  Memory:No data recorded Judgment:No data recorded Insight:No data recorded  Executive Functions  Concentration:No data recorded Attention Span:No data recorded Recall:No data recorded [employer] [place] recorded Language:No data recorded  Psychomotor Activity  Psychomotor Activity:No data recorded  Assets  Assets:No data recorded  Sleep  Sleep:No data recorded  Physical Exam: Physical Exam Vitals and nursing note reviewed.  Constitutional:      Appearance: Normal appearance.  HENT:     Head: Normocephalic and atraumatic.     Mouth/Throat:     Pharynx: Oropharynx is clear.  Eyes:     Pupils: Pupils are equal, round, and reactive to light.  Cardiovascular:     Rate and Rhythm: Normal rate and regular rhythm.  Pulmonary:     Effort: Pulmonary effort is normal.     Breath sounds: Normal breath sounds.  Abdominal:     General: Abdomen is flat.     Palpations: Abdomen is soft.  Musculoskeletal:        General: Normal range of motion.  Skin:    General: Skin is warm and dry.  Neurological:     General: No focal deficit present.     Mental Status: She  is alert. Mental status is at baseline.  Psychiatric:        Attention and Perception: She is inattentive.        Mood and Affect: Mood is anxious.        Speech: Speech is delayed.        Behavior: Behavior is slowed.        Thought Content: Thought content normal. Thought content is not paranoid or delusional. Thought content does not include homicidal or suicidal ideation.        Cognition and Memory: Cognition is impaired. Memory is impaired.    Review of Systems  Constitutional: Negative.   HENT: Negative.   Eyes: Negative.   Respiratory: Negative.   Cardiovascular: Negative.   Gastrointestinal: Negative.   Musculoskeletal: Negative.   Skin: Negative.   Neurological: Negative.   Psychiatric/Behavioral: Positive for memory loss.   Blood pressure 93/70, pulse 71, temperature 99 F (37.2 C), temperature source Axillary, resp. rate 19, height 5' 3" (1.6 m), weight 74.3 kg, SpO2 97 %. Body mass index is 29.02 kg/m.  Treatment Plan Summary: Medication management and Plan Based on the examination I had this afternoon with her the patient was not showing any signs of delirium.  Given of course this extended medical problem that she has had and possibly some neurologic impairment I would not be surprised if she could become delirious at nighttime or in certain situations.  However I do not think her current psychiatric medicines are causing her any problems.  This is what she has been on in the past and she has tolerated her medicine well without any problems before.  We probably should check a Depakote level in another day or 2 just to be on the safe side but I am not going to change anything on her medicine right now.  I will continue to check up on her in the hospital.  Disposition: Patient does not meet criteria for psychiatric inpatient admission. Supportive therapy provided about ongoing stressors. Discussed crisis plan, support from social network, calling 911, coming to the  Emergency Department, and calling Suicide Hotline.  Alethia Berthold, MD 09/18/2020 7:42 PM

## 2020-09-18 NOTE — Significant Event (Signed)
Rapid Response Event Note   Reason for Call :  Code stroke  Initial Focused Assessment:  Rapid response RN and patient's RN Noberto Retort arrived in radiology hallway with patient being moved out of radiology room 3 and transferred back to her bed. Patient at this time was alert and interacted with staff upon verbal stimulation. Per speech therapist, patient had her eyes open but was staring off into space and needed repeated verbal or physical stimulation for interaction with staff. Patient also not able to answer questions the same as she did in the morning, kept repeating certain phrases rather than offering new answers, and wasn't able to participate in the barium swallow evaluation. CBG was 118 at 11:11. At this time, patient was alert and able to state her name and that she was at [facility]. She only intermittently participated in exam. No focal deficits or asymmetry noted, pupils equally round and reactive to light. Some weakness noted in patient's extremities particularly lower extremities. Per Sheleigh RN patient appeared to be the same state she was when she was sent down to radiology from her room at this time. Partway through assessment, Minette Brine the stroke RN and Dr. Curly Shores arrived. See Dr. Lyn Records note for full assessment notes.  Interventions:  Patient taken to CT scan for code stroke CT head without contrast.  Plan of Care:  Patient brought back to her room post CT scan by me. Sheleigh RN to update Dr. Leslye Peer on event. Dr. Curly Shores to review imaging, imaging reports, and patient's chart and place further orders as needed.   Event Summary:   MD Notified: Dr. Curly Shores  Call Time: 12:25 Arrival Time: 12:26 End Time: 13:17  Arabell Neria, Jaynie Bream, RN

## 2020-09-18 NOTE — Progress Notes (Signed)
Modified Barium Swallow Progress Note  Patient Details  Name: [name] MRN: [id number] Date of Birth: 05/10/1946  Today's Date: 09/18/2020  Modified Barium Swallow completed.  Full report located under Chart Review in the Imaging Section.  Brief recommendations include the following:  Clinical Impression  Pt's vitals O2 98%, RR 13 and HR 78, 4 liters via [place], oral dyskinesia present.  Overall pt presents with functional swallow of thin liquids via straw and puree with no aspiration observed. Pt has a posterior head position which if reclined would leave her airway completely exposed but when sitting upright in the chair during this study doesn't pose as great a risk. Specifically, pt's oropharyngeal abilities can be c/b as mildly impaired. Pt presents with weak lingual manipulation of bolus and decreased base of tongue strength. This results in premature spillage, swallow initiation at the pyriform sinuses, decreased epiglottic deflection d/t decreased hyolaryngeal excursion. As such, pt has trace to mild pharyngeal residue that is penetrated before the swallow and decreased epiglottic deflection allows for penetration during the swallow. All penetrates were expelled during the swallow. No aspiration was observed. Her vitals remained stable as above throughout the entire study. Based on performance during this study, would recommend dysphagia 1 with thin liquids via straw with medicine crushed in applesauce.   Swallow Evaluation Recommendations       SLP Diet Recommendations: Dysphagia 1 (Puree) solids;Thin liquid   Liquid Administration via: Straw   Medication Administration: Crushed with puree   Supervision: Staff to assist with self feeding;Full supervision/cueing for compensatory strategies   Compensations: Minimize environmental distractions;Slow rate;Small sips/bites   Postural Changes: Remain semi-upright after after feeds/meals (Comment);Seated upright at 90 degrees    Oral Care Recommendations: Oral care BID      Daniell Mancinas B. Rutherford Nail M.S., CCC-SLP, [place] [facility] [place] 09/18/2020,9:10 PM

## 2020-09-18 NOTE — Procedures (Signed)
Patient Name: Gaetana Kawahara  MRN: [id number]  Epilepsy Attending: Lora Havens  Referring Physician/Provider: Dr Lesleigh Noe Date: 09/18/2020  Duration: 29.27 mins  Patient history: 75yo F with ams. EEG to evaluate for seizure  Level of alertness: Awake  AEDs during EEG study:   Technical aspects: This EEG study was done with scalp electrodes positioned according to the 10-20 International system of electrode placement. Electrical activity was acquired at a sampling rate of 500Hz  and reviewed with a high frequency filter of 70Hz  and a low frequency filter of 1Hz . EEG data were recorded continuously and digitally stored.   Description: The posterior dominant rhythm consists of 7 Hz activity of moderate voltage (25-35 uV) seen predominantly in posterior head regions, symmetric and reactive to eye opening and eye closing.  EEG showed continuous generalized 5 to 6 Hz theta slowing. Physiologic photic driving was not seen during photic stimulation.  Hyperventilation was not performed.     ABNORMALITY - Continuous slow, generalized - Background slow  IMPRESSION: This study is suggestive of mild to moderate diffuse encephalopathy, nonspecific etiology. No seizures or epileptiform discharges were seen throughout the recording.  Tevion Laforge Barbra Sarks

## 2020-09-18 NOTE — Progress Notes (Signed)
Patient taken down for Barium Swallow Eval by SLP.  This RN called by Curley Spice, SLP in radiology who stated patient was altered from previous assessment and she called code stroke.  This RN and charge nurse, Holli Humbles. Responded. See Rapid Response Event note for further details.   Patient taken to CT per Dr. Lyn Records request.   Patient now back in ICU 02 resting comfortably. All vital signs within normal limits.   Dr. Leslye Peer has been made aware.   Happi, SLP advised to keep patient NPO until further assessment can be made.

## 2020-09-18 NOTE — Plan of Care (Signed)
Summary: Problem: Activity: Goal: Ability to tolerate increased activity will improve Outcome: Progressing   Problem: Clinical Measurements: Goal: Ability to maintain a body temperature in the normal range will improve Outcome: Progressing   Problem: Activity: Goal: Risk for activity intolerance will decrease Outcome: Progressing

## 2020-09-18 NOTE — Evaluation (Signed)
Clinical/Bedside Swallow Evaluation Patient Details  Name: [name] MRN: [id number] Date of Birth: [birth year]  Today's Date: 09/18/2020 Time: SLP Start Time (ACUTE ONLY): 0856 SLP Stop Time (ACUTE ONLY): 0927 SLP Time Calculation (min) (ACUTE ONLY): 31 min  Past Medical History:  Past Medical History:  Diagnosis Date  . Arthritis   . Breast cancer ([place])   . Cancer [facility]) 2004   right breast ca  . Diabetes mellitus without complication ([place])   . Hypercholesterolemia   . Hypertension   . Hypothyroid   . Seasonal allergies    Past Surgical History:  Past Surgical History:  Procedure Laterality Date  . ABDOMINAL HYSTERECTOMY    . BREAST BIOPSY Left    neg  . BREAST EXCISIONAL BIOPSY Right 2004   breast ca and lumpectomy  . BREAST LUMPECTOMY Right 2004   breast ca  . CHOLECYSTECTOMY    . COLONOSCOPY WITH PROPOFOL N/A 02/13/2017   Procedure: COLONOSCOPY WITH PROPOFOL;  Surgeon: Jonathon Bellows, MD;  Location: [facility] ENDOSCOPY;  Service: Gastroenterology;  Laterality: N/A;  . KIDNEY STONE SURGERY     HPI:  [name] is a 75 y.o. female with medical history significant for type 2 diabetes mellitus, essential hypertension, acquired hypothyroidism, who is admitted to [facility] on 09/15/2020 with acute hypoxic respiratory failure due to suspected aspiration pneumonia after presenting from SNF to [facility] ED for evaluation of potential aspiration of hotdog. Pt intubated 4/30 with bronchoscopy revealing scrambled eggs and mucus plugs in her airway. Pt extubated on 09/17/20 with consult placed for Bedside Swallow Evaluation. Pt with no history of dysphagia. Chest x-ray on 5/1/202 revealed Persistent though slightly improving bibasilar and retrocardiac opacities. Some slight interval clearing may be related to improving lung volumes.   Assessment / Plan / Recommendation Clinical Impression  Pt was awake, alert, oriented to self, location and reason for  admission to hospital. Currently on 4liters [place]. Pt's vitals were O2 98%, RR 11 and HR 79. Pt's speech was free and clear of any delayed processing, imprecise articulation and she readily followed directions to hold the cup and spoon. Pt stated that she does have dentures but only wears them occasionally. Currently dentures are not at hospital. Pt consumed ice chips and thin liquids via cup and straw with no overt s/s of aspiration. Vitals remained as above. When consuming puree, pt's vitals consistently declined. Her O2 dropped to 81% with RR increased to 30. With coaching, vitals returned to previous baseline. Puree was attempted x 3 with same decline in vitals. Out of an abundance of caution, recommend clear liquid diet with instrumental study to be completed later today to assess for any oropharyngeal deficits that might be contributing to decline in vitals. SLP Visit Diagnosis: Dysphagia, unspecified (R13.10)    Aspiration Risk  Severe aspiration risk    Diet Recommendation NPO   Medication Administration: Via alternative means    Other  Recommendations Oral Care Recommendations: Oral care QID   Follow up Recommendations Other (comment) (TBD)      Frequency and Duration min 2x/week  2 weeks       Prognosis Prognosis for Safe Diet Advancement: Fair Barriers to Reach Goals: Severity of deficits      Swallow Study   General Date of Onset: 09/16/20 HPI: [name] is a 75 y.o. female with medical history significant for type 2 diabetes mellitus, essential hypertension, acquired hypothyroidism, who is admitted to [facility] on 09/15/2020 with acute hypoxic respiratory  failure due to suspected aspiration pneumonia after presenting from SNF to [facility] ED for evaluation of potential aspiration of hotdog. Pt intubated 4/30 with bronchoscopy revealing scrambled eggs and mucus plugs in her airway. Pt extubated on 09/17/20 with consult placed for Bedside Swallow  Evaluation. Pt with no history of dysphagia. Chest x-ray on 5/1/202 revealed Persistent though slightly improving bibasilar and retrocardiac opacities. Some slight interval clearing may be related to improving lung volumes. Type of Study: Bedside Swallow Evaluation Previous Swallow Assessment: none in chart Diet Prior to this Study: NPO Temperature Spikes Noted: No Respiratory Status: Nasal cannula History of Recent Intubation: Yes Length of Intubations (days): 1 days Date extubated: 09/17/20 Behavior/Cognition: Alert;Cooperative;Pleasant mood Oral Cavity Assessment: Within Functional Limits Oral Cavity - Dentition: Edentulous Vision: Functional for self-feeding Self-Feeding Abilities: Needs assist Patient Positioning: Upright in bed Baseline Vocal Quality: Normal Volitional Cough: Strong Volitional Swallow: Able to elicit    Oral/Motor/Sensory Function Overall Oral Motor/Sensory Function: Within functional limits   Ice Chips Ice chips: Within functional limits Presentation: Spoon   Thin Liquid Thin Liquid: Within functional limits Presentation: Cup;Self Fed;Straw    Nectar Thick Nectar Thick Liquid: Not tested   Honey Thick Honey Thick Liquid: Not tested   Puree Puree: Impaired Presentation: Spoon Other Comments:  (consistent decline in respiratory status)   Solid     Solid: Not tested     [name] B. Rutherford Nail M.S., CCC-SLP, [place] [facility] [place] 09/18/2020,8:59 PM

## 2020-09-18 NOTE — Progress Notes (Addendum)
Patient ID: [name], female   DOB: 1946-04-08, 75 y.o.   MRN: [id number] [facility] PROGRESS NOTE  [name] GDJ:[id number] DOB: [birth year] DOA: 09/15/2020 PCP: Mechele Claude, FNP  HPI/Subjective: Patient feels like her breathing is not great.  Not much cough.  Still little shortness of breath.  Mental status better today than yesterday.  No tremor of the arm.  Admitted with acute hypoxic respiratory failure with aspiration event  Objective: Vitals:   09/18/20 1000 09/18/20 1030  BP: 106/65 105/76  Pulse: 84 78  Resp: 18 (!) 23  Temp:    SpO2: (!) 84% 97%    Intake/Output Summary (Last 24 hours) at 09/18/2020 1056 Last data filed at 09/18/2020 1000 Gross per 24 hour  Intake 2508.43 ml  Output 500 ml  Net 2008.43 ml   Filed Weights   09/16/20 1327 09/17/20 0443 09/18/20 0500  Weight: 74 kg 74.3 kg 74.3 kg    ROS: Review of Systems  Respiratory: Positive for cough and shortness of breath.   Cardiovascular: Negative for chest pain.  Gastrointestinal: Negative for abdominal pain, nausea and vomiting.   Exam: Physical Exam HENT:     Head: Normocephalic.     Mouth/Throat:     Pharynx: No oropharyngeal exudate.  Eyes:     General: Lids are normal.     Conjunctiva/sclera: Conjunctivae normal.     Pupils: Pupils are equal, round, and reactive to light.  Cardiovascular:     Rate and Rhythm: Normal rate and regular rhythm.     Heart sounds: Normal heart sounds, S1 normal and S2 normal.  Pulmonary:     Breath sounds: Examination of the right-lower field reveals decreased breath sounds and rhonchi. Examination of the left-lower field reveals decreased breath sounds. Decreased breath sounds and rhonchi present. No wheezing or rales.  Abdominal:     Palpations: Abdomen is soft.     Tenderness: There is no abdominal tenderness.  Musculoskeletal:     Right lower leg: No swelling.     Left lower leg: No swelling.  Skin:    General: Skin is warm.      Findings: No rash.  Neurological:     Mental Status: She is alert.     Comments: Answering questions appropriately       Data Reviewed: Basic Metabolic Panel: Recent Labs  Lab 09/15/20 2151 09/15/20 2259 09/16/20 0608 09/17/20 0405 09/18/20 0548  NA 135 136 136 138 137  K 3.7 3.7 4.1 3.2* 4.0  CL 98 99 98 102 103  CO2 _0 GLUCOSE 193* 175* 268* 169* 136*  BUN 27* 27* 27* 26* 16  CREATININE 1.28* 1.24* 1.45* 0.96 0.92  CALCIUM 9.8 9.9 9.5 9.1 8.3*  MG  --   --  1.5* 1.7 1.7  PHOS  --   --  3.4 2.7 2.5   Liver Function Tests: Recent Labs  Lab 09/15/20 2151 09/15/20 2259 09/16/20 0608 09/18/20 0548  AST _1 --   ALT _2 --   ALKPHOS 44 44 44  --   BILITOT 0.8 0.8 1.3*  --   PROT 7.5 7.4 7.2  --   ALBUMIN 3.6 3.7 3.3* 2.5*   CBC: Recent Labs  Lab 09/15/20 2052 09/16/20 0608 09/17/20 0405 09/18/20 0548  WBC 15.5* 15.2* 15.2* 12.6*  NEUTROABS  --  13.2*  --  9.8*  HGB 14.7 14.8 12.7 11.7*  HCT 43.6 44.2 36.7 34.5*  MCV 88.6 89.5 88.2 88.5  PLT 293 246 218 161  BNP (last 3 results) Recent Labs    03/01/20 1312  BNP 82.3    CBG: Recent Labs  Lab 09/17/20 1536 09/17/20 1913 09/17/20 2337 09/18/20 0321 09/18/20 0717  GLUCAP 135* 151* 146* 127* 120*    Recent Results (from the past 240 hour(s))  Resp Panel by RT-PCR (Flu A&B, Covid) Nasopharyngeal Swab     Status: None   Collection Time: 09/15/20  8:52 PM   Specimen: Nasopharyngeal Swab; Nasopharyngeal(NP) swabs in vial transport medium  Result Value Ref Range Status   SARS Coronavirus 2 by RT PCR NEGATIVE NEGATIVE Final    Comment: (NOTE) SARS-CoV-2 target nucleic acids are NOT DETECTED.  The SARS-CoV-2 RNA is generally detectable in upper respiratory specimens during the acute phase of infection. The lowest concentration of SARS-CoV-2 viral copies this assay can detect is 138 copies/mL. A negative result does not preclude SARS-Cov-2 infection and should not be  used as the sole basis for treatment or other patient management decisions. A negative result may occur with  improper specimen collection/handling, submission of specimen other than nasopharyngeal swab, presence of viral mutation(s) within the areas targeted by this assay, and inadequate number of viral copies(<138 copies/mL). A negative result must be combined with clinical observations, patient history, and epidemiological information. The expected result is Negative.  Fact Sheet for Patients:  EntrepreneurPulse.com.au  Fact Sheet for Healthcare Providers:  IncredibleEmployment.be  This test is no t yet approved or cleared by the [place] FDA and  has been authorized for detection and/or diagnosis of SARS-CoV-2 by FDA under an Emergency Use Authorization (EUA). This EUA will remain  in effect (meaning this test can be used) for the duration of the COVID-19 declaration under Section 564(b)(1) of the Act, 21 U.S.C.section 360bbb-3(b)(1), unless the authorization is terminated  or revoked sooner.       Influenza A by PCR NEGATIVE NEGATIVE Final   Influenza B by PCR NEGATIVE NEGATIVE Final    Comment: (NOTE) The Xpert Xpress SARS-CoV-2/FLU/RSV plus assay is intended as an aid in the diagnosis of influenza from Nasopharyngeal swab specimens and should not be used as a sole basis for treatment. Nasal washings and aspirates are unacceptable for Xpert Xpress SARS-CoV-2/FLU/RSV testing.  Fact Sheet for Patients: EntrepreneurPulse.com.au  Fact Sheet for Healthcare Providers: IncredibleEmployment.be  This test is not yet approved or cleared by the [place] FDA and has been authorized for detection and/or diagnosis of SARS-CoV-2 by FDA under an Emergency Use Authorization (EUA). This EUA will remain in effect (meaning this test can be used) for the duration of the COVID-19 declaration under Section  564(b)(1) of the Act, 21 U.S.C. section 360bbb-3(b)(1), unless the authorization is terminated or revoked.  Performed at [facility], [address] [place], [place] [address]   Culture, blood (Routine X 2) w Reflex to ID Panel     Status: None (Preliminary result)   Collection Time: 09/16/20  6:08 AM   Specimen: BLOOD  Result Value Ref Range Status   Specimen Description BLOOD RIGHT ANTECUBITAL  Final   Special Requests   Final    BOTTLES DRAWN AEROBIC AND ANAEROBIC Blood Culture adequate volume   Culture   Final    NO GROWTH 2 DAYS Performed at [facility], [address] [place], [place] [address]    Report Status PENDING  Incomplete  Culture, blood (Routine X 2) w Reflex to ID Panel  Status: None (Preliminary result)   Collection Time: 09/16/20  7:24 AM   Specimen: BLOOD  Result Value Ref Range Status   Specimen Description BLOOD BLOOD LEFT HAND  Final   Special Requests   Final    BOTTLES DRAWN AEROBIC AND ANAEROBIC Blood Culture adequate volume   Culture   Final    NO GROWTH 2 DAYS Performed at [facility], [address] [place], [place] [address]    Report Status PENDING  Incomplete  Culture, BAL-quantitative w Gram Stain     Status: Abnormal (Preliminary result)   Collection Time: 09/16/20 10:31 AM   Specimen: Bronchoalveolar Lavage; Respiratory  Result Value Ref Range Status   Specimen Description   Final    BRONCHIAL ALVEOLAR LAVAGE Performed at [facility], [address], [place], [place] [address]    Special Requests   Final    NONE Performed at [facility], [place], [place] [address]    Gram Stain   Final    MODERATE WBC PRESENT,BOTH PMN AND MONONUCLEAR NO ORGANISMS SEEN    Culture (A)  Final    10,000 COLONIES/mL Normal respiratory flora-no Staph aureus or Pseudomonas seen Performed at [facility] [address], [place], [place] [address]    Report Status PENDING   Incomplete  Culture, BAL-quantitative w Gram Stain     Status: Abnormal (Preliminary result)   Collection Time: 09/16/20 10:31 AM   Specimen: Bronchial Wash; Respiratory  Result Value Ref Range Status   Specimen Description   Final    BRONCHIAL WASHINGS Performed at [facility], [address], [place], [place] [address]    Special Requests   Final    TRACHEAL ASPIRATE Performed at [facility], [address] [place], [place] [address]    Gram Stain   Final    ABUNDANT WBC PRESENT, PREDOMINANTLY PMN RARE GRAM POSITIVE COCCI    Culture (A)  Final    20,000 COLONIES/mL Normal respiratory flora-no Staph aureus or Pseudomonas seen Performed at [facility] [address] [place], [place] [address]    Report Status PENDING  Incomplete  MRSA PCR Screening     Status: None   Collection Time: 09/16/20  1:30 PM   Specimen: Nasopharyngeal  Result Value Ref Range Status   MRSA by PCR NEGATIVE NEGATIVE Final    Comment:        The GeneXpert MRSA Assay (FDA approved for NASAL specimens only), is one component of a comprehensive MRSA colonization surveillance program. It is not intended to diagnose MRSA infection nor to guide or monitor treatment for MRSA infections. Performed at [facility], [address] [place], [place] [address]      Studies: DG Abd 1 View  Result Date: 09/16/2020 CLINICAL DATA:  OG tube placement EXAM: ABDOMEN - 1 VIEW COMPARISON:  February 15, 2015 FINDINGS: The OG tube terminates in the stomach. IMPRESSION: The OG tube terminates in the stomach. Electronically Signed   By: Dorise Bullion III M.D   On: 09/16/2020 16:11   DG Chest Port 1 View  Result Date: 09/17/2020 CLINICAL DATA:  Acute respiratory failure EXAM: PORTABLE CHEST 1 VIEW COMPARISON:  Radiograph 09/16/2020 FINDINGS: *Low positioning of the endotracheal tube approximately 1 cm from the carina. *Transesophageal tube tip and side port terminate below the  margins of imaging, beyond the GE junction. *Telemetry leads and external support devices overlie the chest. Some persistent hazy bibasilar retrocardiac opacity, slightly  diminished from comparison exam though may be related to improving lung volumes. No visible pneumothorax or effusion. Stable cardiomediastinal contours with a calcified aorta. No acute osseous or soft tissue abnormality. IMPRESSION: Persistent though slightly improving bibasilar and retrocardiac opacities. Some slight interval clearing may be related to improving lung volumes. Low positioning of the endotracheal tube approximately 1 cm from the carina. Consider retraction 2-3 cm to position at the mid trachea. Additional lines and tubes as above. These results will be called to the ordering clinician or representative by the Radiologist Assistant, and communication documented in the PACS or [employer]. Electronically Signed   By: Lovena Le M.D.   On: 09/17/2020 06:26    Scheduled Meds: . chlorhexidine gluconate (MEDLINE KIT)  15 mL Mouth Rinse BID  . Chlorhexidine Gluconate Cloth  6 each Topical Q2200  . clonazePAM  0.5 mg Oral BID  . enoxaparin (LOVENOX) injection  40 mg Subcutaneous Q24H  . FLUoxetine  60 mg Oral Daily  . insulin aspart  0-15 Units Subcutaneous Q4H  . levothyroxine  88 mcg Oral QAC breakfast  . lurasidone  20 mg Oral Daily  . pantoprazole (PROTONIX) IV  40 mg Intravenous Q24H   Continuous Infusions: . sodium chloride Stopped (09/18/20 0922)  . cefTRIAXone (ROCEPHIN)  IV Stopped (09/17/20 1845)  . metronidazole Stopped (09/18/20 0518)  . valproate sodium Stopped (09/18/20 0659)    Assessment/Plan:  1. Acute hypoxic respiratory failure secondary to aspiration.  Patient had worsening respiratory status upon coming into the hospital and required intubation and bronchoscopy.  Fluid was removed from the lungs.  Patient extubated on 09/17/2020.  Currently on 2-1/2 L of oxygen.  Empiric antibiotics.  Patient  to have a modified barium swallow today. 2. Hypothyroidism unspecified on levothyroxine 3. Type 2 diabetes mellitus with hyperlipidemia.  Holding cholesterol medication.  Patient on sliding scale for now 4. Anxiety and depression on numerous psychiatric medications.  The patient's mental status is better today after sedation for the ventilator removed and out of the patient's system. 5. Hypomagnesemia.  Magnesium replacement this morning again. 6. Weakness.  Physical therapy evaluation peer     Code Status:     Code Status Orders  (From admission, onward)         Start     Ordered   09/15/20 2241  Full code  Continuous        09/15/20 2241        Code Status History    Date Active Date Inactive Code Status Order ID Comments User Context   08/22/2020 0250 08/24/2020 1656 Full Code [id number]  Athena Masse, MD ED   07/24/2020 1148 08/02/2020 1622 Full Code [id number]  Collier Bullock, MD ED   01/07/2020 2216 01/11/2020 1627 Full Code [id number]  Eulas Post, MD Inpatient   01/07/2020 1309 01/07/2020 2214 Full Code [id number]  Carrie Mew, MD ED   08/10/2019 2148 08/13/2019 1947 Full Code [id number]  Dixie Dials, MD Inpatient   06/24/2019 2219 06/29/2019 1932 Full Code [id number]  Caroline Sauger, NP Inpatient   07/21/2018 1258 07/27/2018 1734 Full Code [id number]  Lavella Hammock, MD Inpatient   07/21/2018 1258 07/21/2018 1258 Full Code [id number]  Lavella Hammock, MD Inpatient   02/02/2018 1857 02/09/2018 1914 Full Code [id number]  Gonzella Lex, MD Inpatient   07/07/2017 1633 07/10/2017 2004 Full Code [id number]  Gonzella Lex, MD Inpatient   09/20/2014 2036 09/23/2014 1901 Full Code [id number]  Clapacs, Madie Reno, MD Inpatient  Advance Care Planning Activity     Family Communication: spoke with son on the phone Disposition Plan: Status is: Inpatient  Dispo: The patient is from: Rehab              Anticipated d/c is to: Rehab              Patient currently needs to evaluate the patient  to see if she is able to swallow safely.  Being treated for acute hypoxic respiratory failure and aspiration pneumonia   Difficult to place patient.  Hopefully not  Time spent: 28 minutes, case discussed with pharmacist, speech therapy  [place]

## 2020-09-18 NOTE — Progress Notes (Signed)
SLP Follow UP Note  Patient Details Name: [name] MRN: [id number] DOB: 08/27/1945   Nearing the end of the Modified Barium Swallow Study, pt began starring into space, unable to hold to hold her cup, began attempting to eat her gown and IV tubing. Her speech was delayed, slurred, she was not oriented to location or reason for hospitalization. This was a vast difference from her abilities earlier in the morning with me. Her speech was repetitive. I spoke with pt's nurse and I initiated a Code Stroke. Although pt's performance on the Modified Barium Swallow Study, I am recommending pt remain as NPO until further medical assessment can be completed.   Georgia Baria B. Rutherford Nail M.S., CCC-SLP, [facility] [phone]    [name] 09/18/2020, 9:11 PM

## 2020-09-18 NOTE — Progress Notes (Signed)
eeg done °

## 2020-09-18 NOTE — Progress Notes (Signed)
NAME:  [name], MRN:  [id number], DOB:  Oct 03, 1945, LOS: 3 ADMISSION DATE:  09/15/2020, CONSULTATION DATE: 09/15/2020 REFERRING MD: Dr. Kerman Passey, CHIEF COMPLAINT: Shortness of breath  History of Present Illness:  75 year old female arrived at [facility] ED via EMS from her nursing facility where she had been in her normal state of health until choking on a hot dog during her last meal.  Per ED documentation and EMS report the patient was able to cough up chunks of hot dog followed by frothy sputum and some difficulty breathing. Per ED documentation EMS stated the patient's SPO2 was 80%, she was placed on nonrebreather and transported to the ED.   ED course: Upon arrival patient's SPO2 was 90% on 15 L.  Once the patient was settled in a stretcher and sat up having calm down oxygen was able to be titrated to 6 L nasal cannula.  ED Initial vitals: Afebrile at 97.5, RR 14, NSR 95, BP 104/72 with a MAP of 83, SPO2 94% on 6 L nasal cannula. ED Significant labs: AKI with BUN/Cr: 27/1.28, leukocytosis at 15.5, hyperglycemia at 193, COVID-19 negative, CXR negative for acute disease.  Pertinent  Medical History  Hypertension Hypothyroidism Hypercholesterolemia Type 2 diabetes mellitus Breast cancer -right breast 2004 Arthritis MDD Bipolar disorder PTSD Significant Hospital Events: Including procedures, antibiotic start and stop dates in addition to other pertinent events   . 09/15/20- Patient admitted to PCU after " choking event" and acute oxygen need. Marland Kitchen 09/16/20-Pt mechanically intubated for bronchoscopy for FB aspiration and revealed mucous plugging of multiple airways bilaterally which were removed  . 09/17/20-Pt successfully extubated  . 09/17/20-Pt stable from respiratory standpoint PCCM will sign off   Interim History / Subjective:  Pt sitting up in bed no complaints at this time.  Denies shortness of breath currently on 3L O2 via nasal canula with O2 sats 98% no signs of  respiratory distress. No acute episodes overnight.   Objective   Blood pressure 119/65, pulse 84, temperature 98.8 F (37.1 C), temperature source Oral, resp. rate 19, height _0  (1.6 m), weight 74.3 kg, SpO2 100 %.    Vent Mode: PRVC FiO2 (%):  [30 %] 30 % Set Rate:  [16 bmp] 16 bmp Vt Set:  [450 mL] 450 mL PEEP:  [5 cmH20] 5 cmH20 Plateau Pressure:  [17 cmH20] 17 cmH20   Intake/Output Summary (Last 24 hours) at 09/18/2020 1761 Last data filed at 09/18/2020 0500 Gross per 24 hour  Intake 2245.63 ml  Output 525 ml  Net 1720.63 ml   Filed Weights   09/16/20 1327 09/17/20 0443 09/18/20 0500  Weight: 74 kg 74.3 kg 74.3 kg    Examination: General: Elderly female resting in bed, NAD  HEENT: MM pink/moist, anicteric, atraumatic, neck supple Neuro: Alert, disoriented to time, follows commands., PERRA CV: S1S2, RRR, no M/R/G, 2+ radial/2+ distal pulses, no edema  Pulm: Clear throughout, even, non labored GI: Soft, obese, non tender, non distended, +BS x4 Skin: No rashes or lesions present Extremities: Warm, moves all extremities   Labs/imaging that I have personally reviewed  (right click and "Reselect all SmartList Selections" daily)   04/30: Blood x2>>negative>> 04/30: BAL culture>>negative>> 05/1: CXR revealed Persistent though slightly improving bibasilar and retrocardiac opacities. Some slight interval clearing may be related to improving lung volumes. 05/2: pct 1.12/wbc 12.6/hgb 11.7  Resolved Hospital Problem list   Mechanical Intubation Acute kidney injury   Assessment & Plan:  Acute Hypoxic Respiratory Failure secondary to choking event Leukocytosis-  15.5 Suspected Aspiration        -s/p bronchoscopy 04/30~aspirated food particles with mucoid debris -Prn supplemental O2 for dyspnea and/or hypoxia  -Prn bronchodilator therapy  -Continue abx therapy for aspiration pneumonia  -Trend WBC and monitor fever curve  -Trend PCT  -Monitor cultures   Acute Kidney  Injury in the setting of ACE therapy-resolved  Baseline Cr:1.0 , Cr on admission:1.28~05/2 0.92 - Strict I/O's: alert provider if UOP < 0.5 mL/kg/hr - Daily BMP, replace electrolytes PRN - Avoid nephrotoxic agents as able, ensure adequate renal perfusion  Best practice (right click and "Reselect all SmartList Selections" daily)  Diet:  NPO~Speech evaluation pending for possible initiation of diet 09/18/20) Pain/Anxiety/Delirium protocol (if indicated): No VAP protocol (if indicated): Not indicated DVT prophylaxis: Lovenox per primary GI prophylaxis: Protonix per primary Glucose control:  SSI Yes Central venous access:  N/A Arterial line:  N/A Foley:  N/A Mobility:  PT following  PT consulted: Yes Last date of multidisciplinary goals of care discussion 09/18/2020 Code Status:  full code Disposition: ICU   Labs   CBC: Recent Labs  Lab 09/15/20 2052 09/16/20 0608 09/17/20 0405 09/18/20 0548  WBC 15.5* 15.2* 15.2* 12.6*  NEUTROABS  --  13.2*  --  9.8*  HGB 14.7 14.8 12.7 11.7*  HCT 43.6 44.2 36.7 34.5*  MCV 88.6 89.5 88.2 88.5  PLT 293 246 218 878    Basic Metabolic Panel: Recent Labs  Lab 09/15/20 2151 09/15/20 2259 09/16/20 0608 09/17/20 0405 09/18/20 0548  NA 135 136 136 138 137  K 3.7 3.7 4.1 3.2* 4.0  CL 98 99 98 102 103  CO2 _0 GLUCOSE 193* 175* 268* 169* 136*  BUN 27* 27* 27* 26* 16  CREATININE 1.28* 1.24* 1.45* 0.96 0.92  CALCIUM 9.8 9.9 9.5 9.1 8.3*  MG  --   --  1.5* 1.7 1.7  PHOS  --   --  3.4 2.7 2.5   GFR: Estimated Creatinine Clearance: 51 mL/min (by C-G formula based on SCr of 0.92 mg/dL). Recent Labs  Lab 09/15/20 2052 09/16/20 0608 09/17/20 0405 09/18/20 0548  PROCALCITON  --  0.84 2.60 1.12  WBC 15.5* 15.2* 15.2* 12.6*    Liver Function Tests: Recent Labs  Lab 09/15/20 2151 09/15/20 2259 09/16/20 0608 09/18/20 0548  AST _1 --   ALT _2 --   ALKPHOS 44 44 44  --   BILITOT 0.8 0.8 1.3*  --   PROT 7.5  7.4 7.2  --   ALBUMIN 3.6 3.7 3.3* 2.5*   No results for input(s): LIPASE, AMYLASE in the last 168 hours. No results for input(s): AMMONIA in the last 168 hours.  ABG    Component Value Date/Time   PHART 7.36 09/16/2020 1003   PCO2ART 48 09/16/2020 1003   PO2ART 66 (L) 09/16/2020 1003   HCO3 27.1 09/16/2020 1003   O2SAT 91.9 09/16/2020 1003     Coagulation Profile: No results for input(s): INR, PROTIME in the last 168 hours.  Cardiac Enzymes: No results for input(s): CKTOTAL, CKMB, CKMBINDEX, TROPONINI in the last 168 hours.  HbA1C: Hemoglobin A1C  Date/Time Value Ref Range Status  03/26/2012 06:17 AM 6.7 (H) 4.2 - 6.3 % Final    Comment:    The American Diabetes Association recommends that a primary goal of therapy should be <7% and that physicians should reevaluate the treatment regimen in patients with HbA1c values consistently >8%.    Hgb  A1c MFr Bld  Date/Time Value Ref Range Status  07/24/2020 01:50 PM 14.2 (H) 4.8 - 5.6 % Final    Comment:    (NOTE) Pre diabetes:          5.7%-6.4%  Diabetes:              >6.4%  Glycemic control for   <7.0% adults with diabetes   01/10/2020 03:12 PM 9.2 (H) 4.8 - 5.6 % Final    Comment:    (NOTE)         Prediabetes: 5.7 - 6.4         Diabetes: >6.4         Glycemic control for adults with diabetes: <7.0     CBG: Recent Labs  Lab 09/17/20 1536 09/17/20 1913 09/17/20 2337 09/18/20 0321 09/18/20 0717  GLUCAP 135* 151* 146* 127* 120*    Review of Systems: Positives in BOLD  Gen: Denies fever, chills, weight change, fatigue, night sweats HEENT: Denies blurred vision, double vision, hearing loss, tinnitus, sinus congestion, rhinorrhea, sore throat, neck stiffness, dysphagia PULM: Denies shortness of breath, cough, sputum production, hemoptysis, wheezing CV: Denies chest pain, edema, orthopnea, paroxysmal nocturnal dyspnea, palpitations GI: abdominal pain, nausea, vomiting, diarrhea, hematochezia, melena,  constipation, change in bowel habits GU: Denies dysuria, hematuria, polyuria, oliguria, urethral discharge Endocrine: Denies hot or cold intolerance, polyuria, polyphagia or appetite change Derm: Denies rash, dry skin, scaling or peeling skin change Heme: Denies easy bruising, bleeding, bleeding gums Neuro: Denies headache, numbness, weakness, slurred speech, loss of memory or consciousness  Past Medical History:  She,  has a past medical history of Arthritis, Breast cancer ([place]), Cancer ([place]) (2004), Diabetes mellitus without complication ([place]), Hypercholesterolemia, Hypertension, Hypothyroid, and Seasonal allergies.   Surgical History:   Past Surgical History:  Procedure Laterality Date  . ABDOMINAL HYSTERECTOMY    . BREAST BIOPSY Left    neg  . BREAST EXCISIONAL BIOPSY Right 2004   breast ca and lumpectomy  . BREAST LUMPECTOMY Right 2004   breast ca  . CHOLECYSTECTOMY    . COLONOSCOPY WITH PROPOFOL N/A 02/13/2017   Procedure: COLONOSCOPY WITH PROPOFOL;  Surgeon: Jonathon Bellows, MD;  [facility]: [facility] ENDOSCOPY;  Service: Gastroenterology;  Laterality: N/A;  . KIDNEY STONE SURGERY       Social History:   reports that she has quit smoking. Her smoking use included cigarettes. She has never used smokeless tobacco. She reports that she does not drink alcohol and does not use drugs.   Family History:  Her family history is negative for Bladder Cancer and Kidney cancer.   Allergies Allergies  Allergen Reactions  . Navane [Thiothixene] Other (See Comments)    Reaction:  Unknown  . Penicillins Rash    Has patient had a PCN reaction causing immediate rash, facial/tongue/throat swelling, SOB or lightheadedness with hypotension: No Has patient had a PCN reaction causing severe rash involving mucus membranes or skin necrosis: No Has patient had a PCN reaction that required hospitalization: No Has patient had a PCN reaction occurring within the last 10 years: No If all of the above answers  are "NO", then may proceed with Cephalosporin use.  Has patient had a PCN reaction causing immediate rash, facial/tongue/throat swelling, SOB or lightheadedness with hypotension: No Has patient had a PCN reaction causing severe rash involving mucus membranes or skin necrosis: No Has patient had a PCN reaction that required hospitalization: No Has patient had a PCN reaction occurring within the last 10 years: No If all  of the above answers are "NO", then may proceed with Cephalosporin use. Has patient had a PCN reaction causing immediate rash, facial/tongue/throat swelling, SOB or lightheadedness with hypotension: No Has patient had a PCN reaction causing severe rash involving mucus membranes or skin necrosis: No Has patient had a PCN reaction that required hospitalization: No Has patient had a PCN reaction occurring within the last 10 years: No If all of the above answers are "NO", then may proceed with Cephalosporin use.     Home Medications  Prior to Admission medications   Medication Sig Start Date End Date Taking? Authorizing Provider  clonazePAM (KLONOPIN) 1 MG tablet Take 1 tablet (1 mg total) by mouth 2 (two) times daily. 08/24/20   Lorella Nimrod, MD  divalproex (DEPAKOTE ER) 500 MG 24 hr tablet Take 2 tablets (1,000 mg total) by mouth at bedtime. 01/11/20   Clapacs, Madie Reno, MD  enalapril (VASOTEC) 5 MG tablet Take 1 tablet (5 mg total) by mouth 2 (two) times daily. 01/11/20   Clapacs, Madie Reno, MD  FLUoxetine (PROZAC) 20 MG capsule Take 60 mg by mouth daily.    [provider]  insulin detemir (LEVEMIR) 100 UNIT/ML injection Inject 0.15 mLs (15 Units total) into the skin daily. 07/31/20   Charlynne Cousins, MD  LATUDA 20 MG TABS tablet Take 20 mg by mouth daily. 08/17/20   [provider]  levothyroxine (SYNTHROID) 88 MCG tablet Take 88 mcg by mouth daily before breakfast.    [provider]  linagliptin (TRADJENTA) 5 MG TABS tablet Take 5 mg by mouth daily.     [provider]  loratadine (CLARITIN) 10 MG tablet Take 1 tablet (10 mg total) by mouth daily. 01/11/20   Clapacs, Madie Reno, MD  metFORMIN (GLUCOPHAGE) 500 MG tablet Take 2 tablets (1,000 mg total) by mouth 2 (two) times daily with a meal. 07/31/20   Charlynne Cousins, MD  ondansetron (ZOFRAN-ODT) 4 MG disintegrating tablet Take 4 mg by mouth every 6 (six) hours as needed for nausea. 08/16/20   [provider]  rosuvastatin (CRESTOR) 40 MG tablet Take 1 tablet (40 mg total) by mouth daily. 01/11/20   Clapacs, Madie Reno, MD     ~Pt stable from respiratory standpoint. PCCM will sign off at this time thank you for the consultation.  If you need further assistance please call number listed under AMION for the PCCM team.  Marda Stalker, [place] Pager [phone] (please enter 7 digits) PCCM Consult Pager [phone] (please enter 7 digits)

## 2020-09-18 NOTE — Consult Note (Signed)
Neurology Consultation Reason for Consult: Altered mental status, code stroke Requesting Physician: Loletha Grayer, code stroke initially called by SLP [name]  CC: "I want to go home"  History is obtained from: Patient and chart review  HPI: [name] is a 75 y.o. female with a history of bipolar disorder, type 2 diabetes, medication noncompliance, hypertension, hypothyroidism, who presented with aspiration.  She had a witnessed choking event on 09/15/2020, and was found to be hypoxic on EMS arrival, stabilized on nonrebreather initially, but then intubated for bronchoscopy which revealed plugging of multiple areas bilaterally with gross appearance of scrambled eggs and over 4 airways.  She has been treated for presumed aspiration pneumonia with an initial dose of meropenem followed by ceftriaxone and metronidazole, and briefly required Levophed for blood pressure support.  Blood culture negative to date.  On SLP evaluation today she was noted to be markedly different from her baseline, not interactive staring off into space, not following any commands without multiple repetitions, bringing up a cup of contrast to her lips but then not drinking it, and generally being poorly responsive.  Code stroke was activated.  There was no evidence of focal weakness throughout this time.  On physical therapy evaluation just prior to code stroke, she was noted to have limited little to no active participation, with posterior lean and poor balance in sitting precluding attempts to try standing.  Notably she has missed home doses of her Latuda, fluoxetine, Depakote, clonazepam secondary to n.p.o. status.  Regarding recent hospitalizations, 01/11/2020 she was admitted for bipolar disorder with depression and noted to have multiple psychiatric admissions for the same diagnosis in the past (passive suicidal ideation, with rapid improvement with restarting her medications) 07/24/2020 she was admitted  with DKA in the setting of medication noncompliance, A1c 14 08/24/2020, she was readmitted, and noted to be home only for 1 day from rehab prior to having generalized weakness again.  Found to have E. coli UTI and hypokalemia, hypomagnesemia and acute kidney injury.  Although she was living alone at home there was concern that she may need long-term care, and she was discharged back to skilled nursing facility.  LKW: 11:15 AM Per SLP, but on chart review it appears she has had some disorientation throughout this hospitalization tPA given?: No, symptoms more consistent with hypoactive delirium and exam not consistent with focal neurologic deficit Premorbid modified rankin scale:      2 - Slight disability. Able to look after own affairs without assistance, but unable to carry out all previous activities.  ROS: Unable to obtain due to altered mental status.   Past Medical History:  Diagnosis Date   Arthritis    Breast cancer ([place])    Cancer ([place]) 2004   right breast ca   Diabetes mellitus without complication ([place])    Hypercholesterolemia    Hypertension    Hypothyroid    Seasonal allergies    Past Surgical History:  Procedure Laterality Date   ABDOMINAL HYSTERECTOMY     BREAST BIOPSY Left    neg   BREAST EXCISIONAL BIOPSY Right 2004   breast ca and lumpectomy   BREAST LUMPECTOMY Right 2004   breast ca   CHOLECYSTECTOMY     COLONOSCOPY WITH PROPOFOL N/A 02/13/2017   Procedure: COLONOSCOPY WITH PROPOFOL;  Surgeon: Jonathon Bellows, MD;  Location: [facility] ENDOSCOPY;  Service: Gastroenterology;  Laterality: N/A;   KIDNEY STONE SURGERY     Current Outpatient Medications  Medication Instructions   acetaminophen (TYLENOL) 500 mg,  Oral, Every 6 hours PRN   clonazePAM (KLONOPIN) 1 mg, Oral, 2 times daily   divalproex (DEPAKOTE ER) 1,000 mg, Oral, Daily at bedtime   enalapril (VASOTEC) 5 mg, Oral, 2 times daily   FLUoxetine (PROZAC) 60 mg, Oral, Daily   insulin detemir (LEVEMIR) 15 Units,  Subcutaneous, Daily   levothyroxine (SYNTHROID) 88 mcg, Oral, Daily before breakfast   linagliptin (TRADJENTA) 5 mg, Oral, Daily   loratadine (CLARITIN) 10 mg, Oral, Daily   lurasidone (LATUDA) 20 mg, Oral, Daily   metFORMIN (GLUCOPHAGE) 1,000 mg, Oral, 2 times daily with meals   ondansetron (ZOFRAN-ODT) 4 mg, Oral, Every 6 hours PRN   rosuvastatin (CRESTOR) 40 mg, Oral, Daily     Family History  Problem Relation Age of Onset   Bladder Cancer Neg Hx    Kidney cancer Neg Hx     Social History:  reports that she has quit smoking. Her smoking use included cigarettes. She has never used smokeless tobacco. She reports that she does not drink alcohol and does not use drugs.   Exam: Current vital signs: BP 111/64 (BP Location: Right Arm)   Pulse 78   Temp 99 F (37.2 C) (Axillary)   Resp (!) 26   Ht 5\' 3"  (1.6 m)   Wt 74.3 kg   SpO2 97%   BMI 29.02 kg/m  Vital signs in last 24 hours: Temp:  [98.7 F (37.1 C)-99 F (37.2 C)] 99 F (37.2 C) (05/02 1400) Pulse Rate:  [76-90] 78 (05/02 1400) Resp:  [13-29] 26 (05/02 1400) BP: (81-130)/(44-114) 111/64 (05/02 1400) SpO2:  [84 %-100 %] 97 % (05/02 1400) Weight:  [74.3 kg] 74.3 kg (05/02 0500)   Physical Exam  Constitutional: Appears well-developed and well-nourished.  Psych: Affect flat, minimally interactive Eyes: No scleral injection HENT: No oropharyngeal obstruction.  MSK: no joint deformities.  Cardiovascular: Normal rate and regular rhythm.  Respiratory: Effort normal, non-labored breathing GI: Soft.  No distension. There is no tenderness.  Skin: Warm dry and intact visible skin  Neuro: Mental Status: Patient is awake, alert, reports that it is March but correctly states that she is 75 years old Patient is unable to give much history.  Language testing limited given mental status, frequently perseverating, but at times quite clear in her speech stating for example "I want to go home" and stating "be kinder to my  knees" when reflex testing was performed.  On naming testing she will name the first object she is presented correctly but then perseverates on the subject for the remainder of the testing.  She does not repeat Cranial Nerves: II: Visual Fields are full to blink to threat. Pupils are equal, round, and reactive to light.   III,IV, VI: EOMI to tracking examiner V: Facial sensation is symmetric to eyelash brush VII: Facial movement is symmetric.  VIII: hearing is intact to voice X: Uvula elevates symmetrically XI: Unable to assess secondary to patient's mental status  XII: tongue is midline without atrophy or fasciculations.  Motor: Tone is normal. Bulk is normal.  Mild symmetric drift of the bilateral upper extremities.  Bilateral lower extremities with some slight effort against gravity but do not fully clear the bed Sensory: Equally responsive to light tickle in all 4 extremities Deep Tendon Reflexes: 2+ and symmetric in the biceps and patellae.  Plantars: Toes are downgoing on the right and mute on the left Cerebellar: FNF is intact bilaterally, unable to perform with the legs  NIHSS total 8 Score breakdown: One-point  for incorrectly stating the month was March, one-point for left arm weakness, one-point for right arm weakness and decreased level of weakness 2 points for right leg weakness, one-point for mild to moderate aphasia (most likely simple perseveration)   I have reviewed labs in epic and the results pertinent to this consultation are:  CMP notable for creatinine 0.92, glucose 136, albumin 2.5 otherwise normal  CBC notable for mild leukocytosis at 12.6, improving from 15.5 on admission; anemia at 11.7 (microcytic)  Lab Results  Component Value Date   TSH 2.810 08/22/2020     No results found for: VITAMINB12   I have reviewed the images obtained: Head CT personally reviewed, no acute intracranial abnormalities but there is a fair burden of chronic microvascular  disease  Impression: Overall, the impression is of hypoactive delirium, in the setting of aspiration pneumonia, hospitalization, possibly on a background of vascular dementia given longstanding medication noncompliance and burden of chronic microvascular disease on her head CT.  Her underlying psychiatric disease may also be confounding the picture.  Recommendations: - MRI brain w/o contrast to rule out acute process more definitively - EEG - B1, B12, HIV, RPR to complete reversible causes of delirium work-up - Reach out to psychiatry for medication recommendations if patient cannot tolerate PO psych medications    Lesleigh Noe MD-PhD [facility] [phone] [facility] coverage for [facility] is from 8 AM to 4 AM in-house and 4 PM to 8 PM by telephone/video. 8 PM to 8 AM emergent questions or overnight urgent questions should be addressed to Teleneurology On-call or [facility] neurohospitalist; contact information can be found on AMION

## 2020-09-18 NOTE — Evaluation (Signed)
Physical Therapy Evaluation Patient Details Name: [name] MRN: [id number] DOB: 01-23-1946 Today's Date: 09/18/2020   History of Present Illness  Pt is a 75 y.o. female with medical history significant for type 2 diabetes mellitus, essential hypertension, acquired hypothyroidism, who is admitted to [facility] on 09/15/2020 with acute hypoxic respiratory failure.  MD assessment includes: Acute hypoxic respiratory failure secondary to aspiration with food removed from the lungs on vent on extubated, hypothyroidism, depression on numerous psychiatric medications, hypomagnesemia, and weakness.    Clinical Impression  Pt pleasant but with flat affect and alert and oriented to name only.  Pt unable to provide any history or PLOF other than to say that she lives in an apartment.  Below history and PLOF taken from recent prior admissions with validity unknown.  Attempted to contact staff at [facility] but unable to reach anyone familiar with the patient's care.  Pt was total assist with bed mobility tasks and once in sitting leaned heavily posteriorly requiring constant assist to prevent LOB.  With extensive anterior weight shifting activities the pt's sitting balance improved somewhat but she continued to require occasional min A to prevent posterior LOB.  Transfer attempts deferred for patient safety at this time secondary to posterior lean and poor balance in sitting.  Pt will benefit from PT services in a SNF setting upon discharge to safely address deficits listed in patient problem list for decreased caregiver assistance and eventual return to PLOF.     Follow Up Recommendations SNF;Supervision/Assistance - 24 hour    Equipment Recommendations  Other (comment) (TBD at next venue of care)    Recommendations for Other Services       Precautions / Restrictions Precautions Precautions: Fall Restrictions Weight Bearing Restrictions: No      Mobility   Bed Mobility Overal bed mobility: Needs Assistance Bed Mobility: Supine to Sit;Sit to Supine     Supine to sit: +2 for physical assistance;Total assist Sit to supine: Total assist;+2 for physical assistance   General bed mobility comments: Little to no active participation from pt; once sitting at EOB pt required constant min to mod A to prevent posterior LOB    Transfers                 General transfer comment: unable/unsafe to attempt  Ambulation/Gait                Stairs            Wheelchair Mobility    Modified Rankin (Stroke Patients Only)       Balance Overall balance assessment: Needs assistance   Sitting balance-Leahy Scale: Poor   Postural control: Posterior lean                                   Pertinent Vitals/Pain Pain Assessment: No/denies pain    Home Living Family/patient expects to be discharged to:: Private residence Living Arrangements: Alone   Type of Home: Apartment Home Access: Stairs to enter Entrance Stairs-Rails: None Entrance Stairs-Number of Steps: 1 small step to access apartment area Home Layout: One level Home Equipment: Clinical cytogeneticist - 4 wheels;Walker - 2 wheels Additional Comments: Senior living apt; history obtained from chart review secondary to patient being a poor historian, pt did endorse living in an apt but could not provide any further details    Prior Function  Comments: Unknown     Hand Dominance   Dominant Hand: Right    Extremity/Trunk Assessment   Upper Extremity Assessment Upper Extremity Assessment: Difficult to assess due to impaired cognition;Generalized weakness    Lower Extremity Assessment Lower Extremity Assessment: Generalized weakness;Difficult to assess due to impaired cognition       Communication      Cognition Arousal/Alertness: Awake/alert Behavior During Therapy: Flat affect Overall Cognitive Status: No family/caregiver present to  determine baseline cognitive functioning                                 General Comments: Pt alert to name, unable to provide DOB, very slow or unable to follow commands without extensive cuing      General Comments      Exercises Total Joint Exercises Ankle Circles/Pumps: AROM;Strengthening;Both;5 reps;10 reps Hip ABduction/ADduction: PROM;AAROM;Both;10 reps Straight Leg Raises: 10 reps;Both;PROM;AAROM Long Arc Quad: AAROM;Both;10 reps;5 reps;Strengthening Knee Flexion: 5 reps;10 reps;Both;AAROM;Strengthening Other Exercises Other Exercises: Anterior weight shifting activities in sitting to address posterior lean   Assessment/Plan    PT Assessment Patient needs continued PT services  PT Problem List Decreased strength;Decreased activity tolerance;Decreased balance;Decreased mobility;Decreased knowledge of use of DME       PT Treatment Interventions DME instruction;Stair training;Gait training;Functional mobility training;Therapeutic activities;Therapeutic exercise;Balance training;Patient/family education    PT Goals (Current goals can be found in the Care Plan section)  Acute Rehab PT Goals PT Goal Formulation: Patient unable to participate in goal setting Time For Goal Achievement: 10/01/20 Potential to Achieve Goals: Fair    Frequency Min 2X/week   Barriers to discharge Inaccessible home environment;Decreased caregiver support      Co-evaluation               AM-PAC PT "6 Clicks" Mobility  Outcome Measure Help needed turning from your back to your side while in a flat bed without using bedrails?: Total Help needed moving from lying on your back to sitting on the side of a flat bed without using bedrails?: Total Help needed moving to and from a bed to a chair (including a wheelchair)?: Total Help needed standing up from a chair using your arms (e.g., wheelchair or bedside chair)?: Total Help needed to walk in hospital room?: Total Help needed  climbing 3-5 steps with a railing? : Total 6 Click Score: 6    End of Session Equipment Utilized During Treatment: Oxygen Activity Tolerance: Patient tolerated treatment well Patient left: in bed;with call bell/phone within reach;with SCD's reapplied;Other (comment) (All bed rails up in ICU as pt was found) Nurse Communication: Mobility status PT Visit Diagnosis: Difficulty in walking, not elsewhere classified (R26.2);Muscle weakness (generalized) (M62.81)    Time: 1025-1100 PT Time Calculation (min) (ACUTE ONLY): 35 min   Charges:   PT Evaluation $PT Eval Moderate Complexity: 1 Mod PT Treatments $Therapeutic Exercise: 8-22 mins        D. Scott Benjerman Molinelli PT, DPT 09/18/20, 11:24 AM

## 2020-09-18 NOTE — Progress Notes (Signed)
Patient ID: [name], female   DOB: 1946/04/25, 75 y.o.   MRN: [id number]  Code stroke called while patient was at swallow evaluation. Neurology saw the patient and thinks it is likely metabolic CT head negative. Patient following commands and able to move all extremities.  Continue to monitor overnight in stepdown area.  Dr. Loletha Grayer

## 2020-09-19 DIAGNOSIS — E1169 Type 2 diabetes mellitus with other specified complication: Secondary | ICD-10-CM | POA: Diagnosis not present

## 2020-09-19 DIAGNOSIS — F3112 Bipolar disorder, current episode manic without psychotic features, moderate: Secondary | ICD-10-CM | POA: Diagnosis not present

## 2020-09-19 DIAGNOSIS — J690 Pneumonitis due to inhalation of food and vomit: Secondary | ICD-10-CM | POA: Diagnosis not present

## 2020-09-19 DIAGNOSIS — E039 Hypothyroidism, unspecified: Secondary | ICD-10-CM | POA: Diagnosis not present

## 2020-09-19 DIAGNOSIS — J9601 Acute respiratory failure with hypoxia: Secondary | ICD-10-CM | POA: Diagnosis not present

## 2020-09-19 LAB — RPR: RPR Ser Ql: NONREACTIVE

## 2020-09-19 LAB — RENAL FUNCTION PANEL
Albumin: 2.3 g/dL — ABNORMAL LOW (ref 3.5–5.0)
Anion gap: 8 (ref 5–15)
BUN: 12 mg/dL (ref 8–23)
CO2: 27 mmol/L (ref 22–32)
Calcium: 8.2 mg/dL — ABNORMAL LOW (ref 8.9–10.3)
Chloride: 102 mmol/L (ref 98–111)
Creatinine, Ser: 0.7 mg/dL (ref 0.44–1.00)
GFR, Estimated: >60 mL/min (ref >60)
Glucose, Bld: 127 mg/dL — ABNORMAL HIGH (ref 70–99)
Phosphorus: 2.7 mg/dL (ref 2.5–4.6)
Potassium: 3.4 mmol/L — ABNORMAL LOW (ref 3.5–5.1)
Sodium: 137 mmol/L (ref 135–145)

## 2020-09-19 LAB — CBC WITH DIFFERENTIAL/PLATELET
Abs Immature Granulocytes: 0.16 10*3/uL — ABNORMAL HIGH (ref 0.00–0.07)
Basophils Absolute: 0 10*3/uL (ref 0.0–0.1)
Basophils Relative: 0 %
Eosinophils Absolute: 0.1 10*3/uL (ref 0.0–0.5)
Eosinophils Relative: 1 %
HCT: 33.7 % — ABNORMAL LOW (ref 36.0–46.0)
Hemoglobin: 11.4 g/dL — ABNORMAL LOW (ref 12.0–15.0)
Immature Granulocytes: 2 %
Lymphocytes Relative: 18 %
Lymphs Abs: 2 10*3/uL (ref 0.7–4.0)
MCH: 29.7 pg (ref 26.0–34.0)
MCHC: 33.8 g/dL (ref 30.0–36.0)
MCV: 87.8 fL (ref 80.0–100.0)
Monocytes Absolute: 0.5 10*3/uL (ref 0.1–1.0)
Monocytes Relative: 4 %
Neutro Abs: 8 10*3/uL — ABNORMAL HIGH (ref 1.7–7.7)
Neutrophils Relative %: 75 %
Platelets: 163 10*3/uL (ref 150–400)
RBC: 3.84 MIL/uL — ABNORMAL LOW (ref 3.87–5.11)
RDW: 14.1 % (ref 11.5–15.5)
WBC: 10.7 10*3/uL — ABNORMAL HIGH (ref 4.0–10.5)
nRBC: 0 % (ref 0.0–0.2)

## 2020-09-19 LAB — MAGNESIUM: Magnesium: 1.8 mg/dL (ref 1.7–2.4)

## 2020-09-19 LAB — GLUCOSE, CAPILLARY
Glucose-Capillary: 136 mg/dL — ABNORMAL HIGH (ref 70–99)
Glucose-Capillary: 97 mg/dL (ref 70–99)
Glucose-Capillary: 142 mg/dL — ABNORMAL HIGH (ref 70–99)
Glucose-Capillary: 163 mg/dL — ABNORMAL HIGH (ref 70–99)
Glucose-Capillary: 150 mg/dL — ABNORMAL HIGH (ref 70–99)
Glucose-Capillary: 153 mg/dL — ABNORMAL HIGH (ref 70–99)

## 2020-09-19 MED ORDER — VITAMIN B-12 1000 MCG PO TABS
1000.0000 ug | ORAL_TABLET | Freq: Every day | ORAL | Status: DC
  Administered 2020-09-19 – 2020-10-02 (×14): 1000 ug via ORAL
  Filled 2020-09-19 (×14): qty 1

## 2020-09-19 MED ORDER — ALBUTEROL SULFATE (2.5 MG/3ML) 0.083% IN NEBU
2.5000 mg | INHALATION_SOLUTION | Freq: Three times a day (TID) | RESPIRATORY_TRACT | Status: DC
  Administered 2020-09-19 – 2020-09-20 (×2): 2.5 mg via RESPIRATORY_TRACT
  Filled 2020-09-19 (×2): qty 3

## 2020-09-19 MED ORDER — HALOPERIDOL LACTATE 5 MG/ML IJ SOLN: 1.0000 mg | Freq: Four times a day (QID) | INTRAMUSCULAR | Status: DC | PRN

## 2020-09-19 MED ORDER — POTASSIUM CHLORIDE CRYS ER 20 MEQ PO TBCR
20.0000 meq | EXTENDED_RELEASE_TABLET | Freq: Once | ORAL | Status: AC
  Administered 2020-09-19: 20 meq via ORAL
  Filled 2020-09-19: qty 1

## 2020-09-19 MED ORDER — THIAMINE HCL 100 MG/ML IJ SOLN
100.0000 mg | Freq: Every day | INTRAMUSCULAR | Status: DC
  Administered 2020-09-19 – 2020-09-22 (×4): 100 mg via INTRAVENOUS
  Filled 2020-09-19 (×4): qty 2

## 2020-09-19 MED ORDER — INSULIN GLARGINE 100 UNIT/ML ~~LOC~~ SOLN
6.0000 [IU] | Freq: Every day | SUBCUTANEOUS | Status: DC
  Administered 2020-09-19 – 2020-10-01 (×13): 6 [IU] via SUBCUTANEOUS
  Filled 2020-09-19 (×14): qty 0.06

## 2020-09-19 MED ORDER — PANTOPRAZOLE SODIUM 40 MG PO TBEC
40.0000 mg | DELAYED_RELEASE_TABLET | ORAL | Status: DC
  Administered 2020-09-19 – 2020-10-01 (×12): 40 mg via ORAL
  Filled 2020-09-19 (×13): qty 1

## 2020-09-19 NOTE — TOC Initial Note (Addendum)
Transition of Care [facility]) - Initial/Assessment Note    Patient Details  Name: [name] MRN: [id number] Date of Birth: November 15, 1945  Transition of Care [facility]) CM/SW Contact:    Ova Freshwater Phone Number: [phone] 09/19/2020, 11:54 AM  Clinical Narrative:                  CSW presents to [facility] due to apiration and SOB.  Patient is currently in the ICU. Code stroke has been initiated twice on 09/18/2020.  Patient currently has fluctuating orientation.  CSW contacted patient's son [name] [phone], who lives in [place], [place]. [name] stated he and the patient have not seen each other for over 10 years and the patient also does not have a relationship with her daughter, Mr. Andrew's sister. Ms. Rosana Berger stated the patient is not married.  CSW explained to patient's orientation is currently fluctuating and he would need to make decisions if she is unable to make them for herself, since he is her next of kin.  Mr. Vianne Bulls verbalized understanding.  CSW updated Mr. Andrew's on patient's Code stroke initiations on 09/18/2020.  CSW stated the patient has been at [facility] for rehab.  CSW asked Mr. Andrew's if he would be willing to speak with palliative care NP, to go over goals of care if the patient was unable to make decisions for herself and the Attending felt it was necessary. Mr. Vianne Bulls stated he was agreeable to speak with palliative care NP, but "I want to do whatever prolongs her life."  CSW stated that would be a discussion to have with palliative care and they would also be able to share more information on the patient's medical status than I would.  [name]' verbalized understanding and requested that any update calls be between 10:00 - 10:30AM.  CSW confirmed with [facility], patient is able to return for short term rehab once she is ready for d/c.  Barriers to Discharge: Continued Medical Work up   Patient Goals and CMS  Choice     Choice offered to / list presented to : Adult Children Dian Situ     [phone])  Expected Discharge Plan and Services Expected Discharge Plan: [place] In-house Referral: Clinical Social Work   Post Acute Care Choice: Nursing Home ([place]) Living arrangements for the past 2 months: [place]                                      Prior Living Arrangements/Services Living arrangements for the past 2 months: [place] Lives with:: Facility Resident Patient language and need for interpreter reviewed:: Yes Do you feel safe going back to the place where you live?: Yes      Need for Family Participation in Patient Care: Yes (Comment) Care giver support system in place?: Yes (comment)   Criminal Activity/Legal Involvement Pertinent to Current Situation/Hospitalization: No - Comment as needed  Activities of Daily Living      Permission Sought/Granted Permission sought to share information with : Facility Contact Representative,Family Supports    Share Information with NAME: Dian Situ     [phone]  Permission granted to share info w AGENCY: [place]        Emotional Assessment Appearance:: Appears older than stated age Attitude/Demeanor/Rapport: Unable to Assess Affect (typically observed): Unable to Assess Orientation: :  Oriented to Self,Oriented to Place Alcohol / Substance Use: Not Applicable Psych Involvement: No (comment)  Admission diagnosis:  Aspiration pneumonia ([place]) [J69.0] SOB (shortness of breath) [R06.02] Endotracheally intubated [Z97.8] Acute hypoxemic respiratory failure ([place]) [J96.01] Patient Active Problem List   Diagnosis Date Noted  . Acute respiratory failure with hypoxia ([place]) 09/16/2020  . Acute hypoxemic respiratory failure ([place]) 09/16/2020  . Hypomagnesemia   . Aspiration pneumonia ([place]) 09/15/2020  . Generalized weakness 08/22/2020   . UTI (urinary tract infection) 08/22/2020  . Unable to care for self 08/22/2020  . Bipolar 1 disorder ([place]) 08/22/2020  . Weakness 08/22/2020  . AKI (acute kidney injury) ([place]) 08/22/2020  . Bipolar disorder current episode depressed ([place]) 01/07/2020  . MDD (major depressive disorder) 08/10/2019  . MDD (major depressive disorder), recurrent episode, severe ([place]) 06/24/2019  . Chronic kidney disease, stage 3 unspecified ([place]) 05/11/2019  . Pain due to onychomycosis of toenails of both feet 11/23/2018  . Bipolar 1 disorder, manic, moderate ([place]) 07/21/2018  . Bipolar 1 disorder, mixed, severe ([place]) 07/21/2018  . Bipolar 1 disorder with moderate mania ([place]) 07/20/2018  . Severe bipolar I disorder, current or most recent episode depressed ([place]) 07/07/2017  . PTSD (post-traumatic stress disorder) 07/07/2017  . 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 Type 2 diabetes mellitus with hyperlipidemia ([place]) 09/23/2014  . Essential hypertension 09/21/2014  . Hypothyroidism 09/21/2014  . Dyslipidemia 09/21/2014  . Bipolar I disorder, current or most recent episode manic, severe with mixed features ([place]) 09/20/2014   PCP:  Mechele Claude, FNP Pharmacy:   [facility], [address] [address] Phone: [phone] Fax: [place], [place] - [address] [place] [address] Phone: [phone] Fax: [phone]     Social Determinants of Health (SDOH) Interventions    Readmission Risk Interventions Readmission Risk Prevention Plan 08/24/2020  PCP or Specialist Appt within 5-7 Days Complete  Medication Review (RN CM) Complete  Some recent data might be hidden

## 2020-09-19 NOTE — Progress Notes (Signed)
  Speech Language Pathology Treatment: Dysphagia  Patient Details Name: [name] MRN: [id number] DOB: 1945/07/30 Today's Date: 09/19/2020 Time: 4098-1191 SLP Time Calculation (min) (ACUTE ONLY): 16 min  Assessment / Plan / Recommendation Clinical Impression  Skilled treatment session focused on pt's ability to consume puree with thin liquids. Pt was alert, interactive and conversant. Skilled observation was provided of pt consuming puree and thin liquids via straw. Pt's vitals remained stable throughout consumption. Pt consumed without any overt s/s of aspiration or dysphagia. At this time, recommend dysphagia 1 diet with thin liquids via straw, medicine crushed in puree and general aspiration precautions.    HPI HPI: [name] is a 75 y.o. female with medical history significant for type 2 diabetes mellitus, essential hypertension, acquired hypothyroidism, who is admitted to [facility] on 09/15/2020 with acute hypoxic respiratory failure due to suspected aspiration pneumonia after presenting from SNF to [facility] ED for evaluation of potential aspiration of hotdog. Pt intubated 4/30 with bronchoscopy revealing scrambled eggs and mucus plugs in her airway. Pt extubated on 09/17/20 with consult placed for Bedside Swallow Evaluation. Pt with no history of dysphagia. Chest x-ray on 5/1/202 revealed Persistent though slightly improving bibasilar and retrocardiac opacities. Some slight interval clearing may be related to improving lung volumes.      SLP Plan  Continue with current plan of care       Recommendations  Diet recommendations: Dysphagia 1 (puree);Thin liquid Liquids provided via: Straw Medication Administration: Crushed with puree Supervision: Patient able to self feed;Intermittent supervision to cue for compensatory strategies Compensations: Minimize environmental distractions;Slow rate;Small sips/bites Postural Changes and/or Swallow Maneuvers:  Seated upright 90 degrees                Oral Care Recommendations: Oral care BID Follow up Recommendations: Skilled Nursing facility SLP Visit Diagnosis: Dysphagia, oropharyngeal phase (R13.12) Plan: Continue with current plan of care       GO               [name] B. Rutherford Nail M.S., [place], [place] Office [phone]  [name] 09/19/2020, 8:43 PM

## 2020-09-19 NOTE — Progress Notes (Signed)
Neurology Progress Note  Patient ID: [name] is a 75 y.o. female with a history of bipolar disorder, type 2 diabetes, medication noncompliance, hypertension, hypothyroidism, who presented with aspiration.  Initially consulted for: Code stroke for altered mental status  Subjective/interval events: -Appreciate psychiatry evaluation, patient was near baseline on their evaluation -No acute complaints today, no headaches  Exam: Vitals:   09/19/20 0700 09/19/20 0800  BP: 108/64 93/75  Pulse: 64 60  Resp: (!) 21 17  Temp:  98.6 F (37 C)  SpO2: 96% 98%   Gen: In bed, comfortable  Resp: non-labored breathing, no grossly audible wheezing Cardiac: Perfusing extremities well  Abd: soft, nt  Neuro: MS: Much improved, oriented to year, when asked month still perseverates on year but able to identify month with choices, speech more fluent, follows commands more briskly  CN: visual fields full, EOMI but saccadic, tongue midline, uvula elevates symmetrically Motor: Mild bilateral pronator drift symmetric; 2/5 bilateral lower extremities Sensory:Intact to light touch throughout   Pertinent data: B12 low normal at 295, HIV negative Thiamine and RPR are pending Leukocytosis continues to improve, now down to 10.7  MRI brain, personally reviewed, negative for acute intracranial process EEG with continuous slow generalized activity, negative for epileptiform discharges  Impression: EEG with slowing, MRI brain negative for acute stroke, and waxing and waning attention / cooperation most consistent with hypoactive delirium.   Recommendations: -Please follow-up thiamine and RPR, treat as needed -Empiric thiamine 100 mg daily, may be discontinued if thiamine level results normal.  If thiamine level is low, please treat with IV thiamine 400 mg every 8 hours x3 days (not starting this empirically given low index of suspicion overall) -Goal B12 level greater than 400, initiating 1000  mcg p.o. daily -Neurology will be available on an as-needed basis going forward, please reconsult if new questions arise  Lesleigh Noe MD-PhD [facility] [phone]  [facility] coverage for [facility] is from 8 AM to 4 AM in-house and 4 PM to 8 PM by telephone/video. 8 PM to 8 AM emergent questions or overnight urgent questions should be addressed to Teleneurology On-call or [facility] neurohospitalist; contact information can be found on AMION  Greater than 25 minutes spent in care of this patient today, greater than 50% at bedside as documented above.

## 2020-09-19 NOTE — Progress Notes (Signed)
Patient ID: [name], female   DOB: [birth year], 75 y.o.   MRN: [id number] [facility] PROGRESS NOTE  Alegandra Sommers UKG:[id number] DOB: 28-Jan-1946 DOA: 09/15/2020 PCP: Mechele Claude, FNP  HPI/Subjective: Patient states that her breathing is not that good.  Some cough.  Answer some questions appropriately.  Objective: Vitals:   09/19/20 1500 09/19/20 1600  BP: (!) 119/91 106/63  Pulse: 88 81  Resp: 20 (!) 23  Temp:  99.1 F (37.3 C)  SpO2: 92% 99%    Intake/Output Summary (Last 24 hours) at 09/19/2020 1703 Last data filed at 09/19/2020 1600 Gross per 24 hour  Intake 1199.79 ml  Output 600 ml  Net 599.79 ml   Filed Weights   09/17/20 0443 09/18/20 0500 09/19/20 0500  Weight: 74.3 kg 74.3 kg 76.7 kg    ROS: Review of Systems  Respiratory: Positive for cough and shortness of breath.   Cardiovascular: Negative for chest pain.  Gastrointestinal: Negative for abdominal pain, nausea and vomiting.   Exam: Physical Exam HENT:     Head: Normocephalic.     Mouth/Throat:     Pharynx: No oropharyngeal exudate.  Eyes:     General: Lids are normal.     Conjunctiva/sclera: Conjunctivae normal.     Pupils: Pupils are equal, round, and reactive to light.  Cardiovascular:     Rate and Rhythm: Normal rate and regular rhythm.     Heart sounds: Normal heart sounds, S1 normal and S2 normal.  Pulmonary:     Breath sounds: Examination of the right-lower field reveals decreased breath sounds and rhonchi. Examination of the left-lower field reveals decreased breath sounds and rhonchi. Decreased breath sounds and rhonchi present. No wheezing or rales.  Abdominal:     Palpations: Abdomen is soft.     Tenderness: There is no abdominal tenderness.  Musculoskeletal:     Right ankle: Swelling present.     Left ankle: Swelling present.  Skin:    General: Skin is warm.     Findings: No rash.  Neurological:     Mental Status: She is alert.     Comments: Answers some  questions appropriately.  Follows commands.       Data Reviewed: Basic Metabolic Panel: Recent Labs  Lab 09/15/20 2259 09/16/20 0608 09/17/20 0405 09/18/20 0548 09/19/20 0421  NA 136 136 138 137 137  K 3.7 4.1 3.2* 4.0 3.4*  CL 99 98 102 103 102  CO2 _0 GLUCOSE 175* 268* 169* 136* 127*  BUN 27* 27* 26* 16 12  CREATININE 1.24* 1.45* 0.96 0.92 0.70  CALCIUM 9.9 9.5 9.1 8.3* 8.2*  MG  --  1.5* 1.7 1.7 1.8  PHOS  --  3.4 2.7 2.5 2.7   Liver Function Tests: Recent Labs  Lab 09/15/20 2151 09/15/20 2259 09/16/20 0608 09/18/20 0548 09/19/20 0421  AST _1 --   --   ALT _2 --   --   ALKPHOS 44 44 44  --   --   BILITOT 0.8 0.8 1.3*  --   --   PROT 7.5 7.4 7.2  --   --   ALBUMIN 3.6 3.7 3.3* 2.5* 2.3*   CBC: Recent Labs  Lab 09/15/20 2052 09/16/20 0608 09/17/20 0405 09/18/20 0548 09/19/20 0421  WBC 15.5* 15.2* 15.2* 12.6* 10.7*  NEUTROABS  --  13.2*  --  9.8* 8.0*  HGB 14.7 14.8 12.7 11.7* 11.4*  HCT 43.6 44.2 36.7  34.5* 33.7*  MCV 88.6 89.5 88.2 88.5 87.8  PLT 293 246 218 161 163   BNP (last 3 results) Recent Labs    03/01/20 1312  BNP 82.3    CBG: Recent Labs  Lab 09/18/20 2314 09/19/20 0331 09/19/20 0731 09/19/20 1123 09/19/20 1554  GLUCAP 114* 136* 97 142* 163*    Recent Results (from the past 240 hour(s))  Resp Panel by RT-PCR (Flu A&B, Covid) Nasopharyngeal Swab     Status: None   Collection Time: 09/15/20  8:52 PM   Specimen: Nasopharyngeal Swab; Nasopharyngeal(NP) swabs in vial transport medium  Result Value Ref Range Status   SARS Coronavirus 2 by RT PCR NEGATIVE NEGATIVE Final    Comment: (NOTE) SARS-CoV-2 target nucleic acids are NOT DETECTED.  The SARS-CoV-2 RNA is generally detectable in upper respiratory specimens during the acute phase of infection. The lowest concentration of SARS-CoV-2 viral copies this assay can detect is 138 copies/mL. A negative result does not preclude SARS-Cov-2 infection and  should not be used as the sole basis for treatment or other patient management decisions. A negative result may occur with  improper specimen collection/handling, submission of specimen other than nasopharyngeal swab, presence of viral mutation(s) within the areas targeted by this assay, and inadequate number of viral copies(<138 copies/mL). A negative result must be combined with clinical observations, patient history, and epidemiological information. The expected result is Negative.  Fact Sheet for Patients:  EntrepreneurPulse.com.au  Fact Sheet for Healthcare Providers:  IncredibleEmployment.be  This test is no t yet approved or cleared by the [place] FDA and  has been authorized for detection and/or diagnosis of SARS-CoV-2 by FDA under an Emergency Use Authorization (EUA). This EUA will remain  in effect (meaning this test can be used) for the duration of the COVID-19 declaration under Section 564(b)(1) of the Act, 21 U.S.C.section 360bbb-3(b)(1), unless the authorization is terminated  or revoked sooner.       Influenza A by PCR NEGATIVE NEGATIVE Final   Influenza B by PCR NEGATIVE NEGATIVE Final    Comment: (NOTE) The Xpert Xpress SARS-CoV-2/FLU/RSV plus assay is intended as an aid in the diagnosis of influenza from Nasopharyngeal swab specimens and should not be used as a sole basis for treatment. Nasal washings and aspirates are unacceptable for Xpert Xpress SARS-CoV-2/FLU/RSV testing.  Fact Sheet for Patients: EntrepreneurPulse.com.au  Fact Sheet for Healthcare Providers: IncredibleEmployment.be  This test is not yet approved or cleared by the [place] FDA and has been authorized for detection and/or diagnosis of SARS-CoV-2 by FDA under an Emergency Use Authorization (EUA). This EUA will remain in effect (meaning this test can be used) for the duration of the COVID-19 declaration  under Section 564(b)(1) of the Act, 21 U.S.C. section 360bbb-3(b)(1), unless the authorization is terminated or revoked.  Performed at [facility], [address] [place], [place] [address]   Culture, blood (Routine X 2) w Reflex to ID Panel     Status: None (Preliminary result)   Collection Time: 09/16/20  6:08 AM   Specimen: BLOOD  Result Value Ref Range Status   Specimen Description BLOOD RIGHT ANTECUBITAL  Final   Special Requests   Final    BOTTLES DRAWN AEROBIC AND ANAEROBIC Blood Culture adequate volume   Culture   Final    NO GROWTH 3 DAYS Performed at [facility], [address], [place], [place] [address]    Report Status PENDING  Incomplete  Culture, blood (Routine X 2) w Reflex to  ID Panel     Status: None (Preliminary result)   Collection Time: 09/16/20  7:24 AM   Specimen: BLOOD  Result Value Ref Range Status   Specimen Description BLOOD BLOOD LEFT HAND  Final   Special Requests   Final    BOTTLES DRAWN AEROBIC AND ANAEROBIC Blood Culture adequate volume   Culture   Final    NO GROWTH 3 DAYS Performed at [facility], [address], [place], [place] [address]    Report Status PENDING  Incomplete  Culture, BAL-quantitative w Gram Stain     Status: Abnormal   Collection Time: 09/16/20 10:31 AM   Specimen: Bronchoalveolar Lavage; Respiratory  Result Value Ref Range Status   Specimen Description   Final    BRONCHIAL ALVEOLAR LAVAGE Performed at [facility], [address] [place], [place] [address]    Special Requests   Final    NONE Performed at [facility], [place], [place] [address]    Gram Stain   Final    MODERATE WBC PRESENT,BOTH PMN AND MONONUCLEAR NO ORGANISMS SEEN    Culture (A)  Final    10,000 COLONIES/mL Normal respiratory flora-no Staph aureus or Pseudomonas seen Performed at [facility] [address], [place], [place] [address]    Report Status 09/18/2020 FINAL   Final  Culture, BAL-quantitative w Gram Stain     Status: Abnormal   Collection Time: 09/16/20 10:31 AM   Specimen: Bronchial Wash; Respiratory  Result Value Ref Range Status   Specimen Description   Final    BRONCHIAL WASHINGS Performed at [facility], [address] [place], [place] [address]    Special Requests   Final    TRACHEAL ASPIRATE Performed at [facility], [address] [place], [place] [address]    Gram Stain   Final    ABUNDANT WBC PRESENT, PREDOMINANTLY PMN RARE GRAM POSITIVE COCCI    Culture (A)  Final    20,000 COLONIES/mL Normal respiratory flora-no Staph aureus or Pseudomonas seen Performed at [facility] [address], [place],  [address]    Report Status 09/18/2020 FINAL  Final  MRSA PCR Screening     Status: None   Collection Time: 09/16/20  1:30 PM   Specimen: Nasopharyngeal  Result Value Ref Range Status   MRSA by PCR NEGATIVE NEGATIVE Final    Comment:        The GeneXpert MRSA Assay (FDA approved for NASAL specimens only), is one component of a comprehensive MRSA colonization surveillance program. It is not intended to diagnose MRSA infection nor to guide or monitor treatment for MRSA infections. Performed at [facility], [address] [place],  [address]      Studies: MR BRAIN WO CONTRAST  Result Date: 09/18/2020 CLINICAL DATA:  Delirium EXAM: MRI HEAD WITHOUT CONTRAST TECHNIQUE: Multiplanar, multiecho pulse sequences of the brain and surrounding structures were obtained without intravenous contrast. COMPARISON:  04/06/2020 FINDINGS: Brain: No acute infarct, mass effect or extra-axial collection. No acute or chronic hemorrhage. Hyperintense T2-weighted signal is moderately widespread throughout the white matter. Generalized volume loss without a clear lobar predilection. The midline structures are normal. Vascular: Major flow voids are preserved. Skull and upper cervical spine: Normal  calvarium and skull base. Visualized upper cervical spine and soft tissues are normal. Sinuses/Orbits:No paranasal sinus fluid levels or advanced mucosal thickening. No mastoid or middle ear effusion. Normal orbits. IMPRESSION: 1. No acute intracranial abnormality.  2. Generalized volume loss and findings of chronic small vessel disease. Electronically Signed   By: Ulyses Jarred M.D.   On: 09/18/2020 20:32   EEG adult  Result Date: 09/18/2020 Lora Havens, MD     09/18/2020  6:14 PM Patient Name: [name] MRN: [id number] Epilepsy Attending: Lora Havens Referring Physician/Provider: Dr Lesleigh Noe Date: 09/18/2020 Duration: 29.27 mins Patient history: 75yo F with ams. EEG to evaluate for seizure Level of alertness: Awake AEDs during EEG study: Technical aspects: This EEG study was done with scalp electrodes positioned according to the 10-20 International system of electrode placement. Electrical activity was acquired at a sampling rate of _0  and reviewed with a high frequency filter of _1  and a low frequency filter of _2 . EEG data were recorded continuously and digitally stored. Description: The posterior dominant rhythm consists of 7 Hz activity of moderate voltage (25-35 uV) seen predominantly in posterior head regions, symmetric and reactive to eye opening and eye closing. EEG showed continuous generalized 5 to 6 Hz theta slowing. Physiologic photic driving was not seen during photic stimulation.  Hyperventilation was not performed.   ABNORMALITY - Continuous slow, generalized - Background slow IMPRESSION: This study is suggestive of mild to moderate diffuse encephalopathy, nonspecific etiology. No seizures or epileptiform discharges were seen throughout the recording. Priyanka Barbra Sarks   CT HEAD CODE STROKE WO CONTRAST`  Result Date: 09/18/2020 CLINICAL DATA:  Code stroke.  Altered mental status EXAM: CT HEAD WITHOUT CONTRAST TECHNIQUE: Contiguous axial images were obtained from  the base of the skull through the vertex without intravenous contrast. COMPARISON:  07/24/2020 FINDINGS: Brain: There is no acute intracranial hemorrhage, mass effect, or edema. Gray-white differentiation remains preserved. Patchy and confluent areas of hypoattenuation in the supratentorial white matter nonspecific but probably reflect similar moderate to marked chronic microvascular ischemic changes. Prominence of the ventricles and sulci reflects similar generalized parenchymal volume loss. No extra-axial collection. Vascular: No hyperdense vessel or unexpected calcification. Skull: Unremarkable. Sinuses/Orbits: No acute finding. Other: Mastoid air cells are clear. ASPECTS ([place] Stroke Program Early CT Score) - Ganglionic level infarction (caudate, lentiform nuclei, internal capsule, insula, M1-M3 cortex): 7 - Supraganglionic infarction (M4-M6 cortex): 3 Total score (0-10 with 10 being normal): 10 IMPRESSION: There is no acute intracranial hemorrhage or evidence of acute infarction. ASPECT score is 10. Stable chronic/nonemergent findings detailed above. These results were communicated to at 1:09 pmon 5/2/2022by text page via the [facility] messaging system. Electronically Signed   By: Macy Mis M.D.   On: 09/18/2020 13:16    Scheduled Meds: . chlorhexidine gluconate (MEDLINE KIT)  15 mL Mouth Rinse BID  . Chlorhexidine Gluconate Cloth  6 each Topical Q2200  . divalproex  500 mg Oral Q12H  . enoxaparin (LOVENOX) injection  40 mg Subcutaneous Q24H  . FLUoxetine  60 mg Oral Daily  . insulin aspart  0-15 Units Subcutaneous Q4H  . levothyroxine  88 mcg Oral QAC breakfast  . lurasidone  20 mg Oral Daily  . pantoprazole  40 mg Oral Q24H  . thiamine injection  100 mg Intravenous Daily  . vitamin B-12  1,000 mcg Oral Daily   Continuous Infusions: . sodium chloride Stopped (09/18/20 1238)  . cefTRIAXone (ROCEPHIN)  IV Stopped (09/18/20 1756)  . metronidazole Stopped (09/19/20 1244)   Brief history.   Patient was admitted 09/15/2020 with aspiration of hotdog.  Past medical history of hypothyroidism, type 2 diabetes mellitus, and bipolar disorder.  Her respiratory status rapidly deteriorated where she  required BiPAP and changed to stepdown admission.  Her oxygen requirements worsened requiring 100% on the BiPAP.  She was intubated on 09/16/2020 and had a bronchoscopy which removed some food.  She was extubated on 09/17/2020.  She is currently on nasal cannula 4 L.  There was a code stroke called yesterday while she was down at swallow study.  Seen by neurology.  CT and MRI of the brain negative for acute stroke.  Assessment/Plan:  1. Acute hypoxic respiratory failure secondary to aspiration.  Patient had worsening respiratory status upon coming into the hospital and required intubation and bronchoscopy.  Fluid was removed from the lung.  Patient was extubated on 09/17/2020 and currently on 4 L of oxygen.  Continue empiric antibiotics.  We will repeat chest x-ray tomorrow. 2. Code stroke was called yesterday but MRI and CAT scan did not show any acute stroke.  Seen by neurology. 3. Hypothyroidism unspecified on levothyroxine 4. Type 2 diabetes mellitus with hyperlipidemia.  Patient currently on sliding scale insulin.  Now that she is on a diet can add low-dose Lantus at night. 5. Bipolar disorder on numerous psychiatric medications 6. Weakness.  Physical therapy recommends rehab 7. Palliative care consultation        Code Status:     Code Status Orders  (From admission, onward)         Start     Ordered   09/15/20 2241  Full code  Continuous        09/15/20 2241        Code Status History    Date Active Date Inactive Code Status Order ID Comments User Context   08/22/2020 0250 08/24/2020 1656 Full Code [id number]  Athena Masse, MD ED   07/24/2020 1148 08/02/2020 1622 Full Code [id number]  Collier Bullock, MD ED   01/07/2020 2216 01/11/2020 1627 Full Code [id number]  Eulas Post, MD  Inpatient   01/07/2020 1309 01/07/2020 2214 Full Code [id number]  Carrie Mew, MD ED   08/10/2019 2148 08/13/2019 1947 Full Code [id number]  Dixie Dials, MD Inpatient   06/24/2019 2219 06/29/2019 1932 Full Code [id number]  Caroline Sauger, NP Inpatient   07/21/2018 1258 07/27/2018 1734 Full Code [id number]  Lavella Hammock, MD Inpatient   07/21/2018 1258 07/21/2018 1258 Full Code [id number]  Lavella Hammock, MD Inpatient   02/02/2018 1857 02/09/2018 1914 Full Code [id number]  Gonzella Lex, MD Inpatient   07/07/2017 1633 07/10/2017 2004 Full Code [id number]  Gonzella Lex, MD Inpatient   09/20/2014 2036 09/23/2014 1901 Full Code [id number]  Clapacs, Madie Reno, MD Inpatient   Advance Care Planning Activity     Family Communication: Spoke with son on the phone Disposition Plan: Status is: Inpatient  Dispo: The patient is from: Rehab              Anticipated d/c is to: Rehab              Patient currently on 4 L of oxygen being treated for aspiration pneumonia.  Out of ICU today   Difficult to place patient.  Hopefully not  Consultants:  Critical care specialist  Psychiatry  Neurology  Procedures:  Bronchoscopy  Antibiotics:  Rocephin  Flagyl  Time spent: 28 minutes  [place]

## 2020-09-19 NOTE — Consult Note (Signed)
[facility] Consult   Reason for Consult: Follow-up consult 75 year old woman with history of schizoaffective or bipolar disorder in the hospital because of an incident of respiratory distress. Referring Physician:  Earleen Newport Patient Identification: [name] MRN:  [id number] Principal Diagnosis: Bipolar 1 disorder with moderate mania ([place]) Diagnosis:  Principal Problem:   Bipolar 1 disorder with moderate mania ([place]) Active Problems:   Essential hypertension   Hypothyroidism   Dyslipidemia   Type 2 diabetes mellitus with hyperlipidemia ([place])   AKI (acute kidney injury) ([place])   Aspiration pneumonia ([place])   Acute respiratory failure with hypoxia ([place])   Acute hypoxemic respiratory failure ([place])   Total Time spent with patient: 30 minutes  Subjective:   [name] is a 75 y.o. female patient admitted with "I swallowed a wiener".  HPI: Patient seen chart reviewed.  Patient this afternoon looks better even than she did yesterday.  She has been moved out of the ICU to a regular floor bed.  She was awake and alert and recognized me and could remember my name.  She knew where she was and when she was and why she was in the hospital.  Maintained good eye contact.  Euthymic affect.  Denies hallucinations.  No bizarre thinking.  Past Psychiatric History: Past history of longstanding bipolar disorder with depression frequently.  Risk to Self:   Risk to Others:   Prior Inpatient Therapy:   Prior Outpatient Therapy:    Past Medical History:  Past Medical History:  Diagnosis Date  . Arthritis   . Breast cancer ([place])   . Cancer [facility]) 2004   right breast ca  . Diabetes mellitus without complication ([place])   . Hypercholesterolemia   . Hypertension   . Hypothyroid   . Seasonal allergies     Past Surgical History:  Procedure Laterality Date  . ABDOMINAL HYSTERECTOMY    . BREAST BIOPSY Left    neg  . BREAST EXCISIONAL BIOPSY Right 2004   breast ca  and lumpectomy  . BREAST LUMPECTOMY Right 2004   breast ca  . CHOLECYSTECTOMY    . COLONOSCOPY WITH PROPOFOL N/A 02/13/2017   Procedure: COLONOSCOPY WITH PROPOFOL;  Surgeon: Jonathon Bellows, MD;  Location: [facility] ENDOSCOPY;  Service: Gastroenterology;  Laterality: N/A;  . KIDNEY STONE SURGERY     Family History:  Family History  Problem Relation Age of Onset  . Bladder Cancer Neg Hx   . Kidney cancer Neg Hx    Family Psychiatric  History: See previous Social History:  Social History   Substance and Sexual Activity  Alcohol Use No     Social History   Substance and Sexual Activity  Drug Use No    Social History   Socioeconomic History  . Marital status: Divorced    Spouse name: Not on file  . Number of children: Not on file  . Years of education: Not on file  . Highest education level: Not on file  Occupational History  . Not on file  Tobacco Use  . Smoking status: Former Smoker    Types: Cigarettes  . Smokeless tobacco: Never Used  Vaping Use  . Vaping Use: Never used  Substance and Sexual Activity  . Alcohol use: No  . Drug use: No  . Sexual activity: Not Currently  Other Topics Concern  . Not on file  Social History Narrative  . Not on file   Social Determinants of Health   Financial Resource Strain: Not on file  Food  Insecurity: Not on file  Transportation Needs: Not on file  Physical Activity: Not on file  Stress: Not on file  Social Connections: Not on file   Additional Social History:    Allergies:   Allergies  Allergen Reactions  . Navane [Thiothixene] Other (See Comments)    Reaction:  Unknown  . Penicillins Rash    Has patient had a PCN reaction causing immediate rash, facial/tongue/throat swelling, SOB or lightheadedness with hypotension: No Has patient had a PCN reaction causing severe rash involving mucus membranes or skin necrosis: No Has patient had a PCN reaction that required hospitalization: No Has patient had a PCN reaction occurring  within the last 10 years: No If all of the above answers are "NO", then may proceed with Cephalosporin use.  Has patient had a PCN reaction causing immediate rash, facial/tongue/throat swelling, SOB or lightheadedness with hypotension: No Has patient had a PCN reaction causing severe rash involving mucus membranes or skin necrosis: No Has patient had a PCN reaction that required hospitalization: No Has patient had a PCN reaction occurring within the last 10 years: No If all of the above answers are "NO", then may proceed with Cephalosporin use. Has patient had a PCN reaction causing immediate rash, facial/tongue/throat swelling, SOB or lightheadedness with hypotension: No Has patient had a PCN reaction causing severe rash involving mucus membranes or skin necrosis: No Has patient had a PCN reaction that required hospitalization: No Has patient had a PCN reaction occurring within the last 10 years: No If all of the above answers are "NO", then may proceed with Cephalosporin use.    Labs:  Results for orders placed or performed during the hospital encounter of 09/15/20 (from the past 48 hour(s))  Glucose, capillary     Status: Abnormal   Collection Time: 09/17/20  7:13 PM  Result Value Ref Range   Glucose-Capillary 151 (H) 70 - 99 mg/dL    Comment: Glucose reference range applies only to samples taken after fasting for at least 8 hours.   Comment 1 Notify RN    Comment 2 Document in Chart   Glucose, capillary     Status: Abnormal   Collection Time: 09/17/20 11:37 PM  Result Value Ref Range   Glucose-Capillary 146 (H) 70 - 99 mg/dL    Comment: Glucose reference range applies only to samples taken after fasting for at least 8 hours.   Comment 1 Notify RN    Comment 2 Document in Chart   Glucose, capillary     Status: Abnormal   Collection Time: 09/18/20  3:21 AM  Result Value Ref Range   Glucose-Capillary 127 (H) 70 - 99 mg/dL    Comment: Glucose reference range applies only to samples  taken after fasting for at least 8 hours.   Comment 1 Notify RN    Comment 2 Document in Chart   Procalcitonin     Status: None   Collection Time: 09/18/20  5:48 AM  Result Value Ref Range   Procalcitonin 1.12 ng/mL    Comment:        Interpretation: PCT > 0.5 ng/mL and <= 2 ng/mL: Systemic infection (sepsis) is possible, but other conditions are known to elevate PCT as well. (NOTE)       Sepsis PCT Algorithm           Lower Respiratory Tract  Infection PCT Algorithm    ----------------------------     ----------------------------         PCT < 0.25 ng/mL                PCT < 0.10 ng/mL          Strongly encourage             Strongly discourage   discontinuation of antibiotics    initiation of antibiotics    ----------------------------     -----------------------------       PCT 0.25 - 0.50 ng/mL            PCT 0.10 - 0.25 ng/mL               OR       >80% decrease in PCT            Discourage initiation of                                            antibiotics      Encourage discontinuation           of antibiotics    ----------------------------     -----------------------------         PCT >= 0.50 ng/mL              PCT 0.26 - 0.50 ng/mL                AND       <80% decrease in PCT             Encourage initiation of                                             antibiotics       Encourage continuation           of antibiotics    ----------------------------     -----------------------------        PCT >= 0.50 ng/mL                  PCT > 0.50 ng/mL               AND         increase in PCT                  Strongly encourage                                      initiation of antibiotics    Strongly encourage escalation           of antibiotics                                     -----------------------------                                           PCT <= 0.25 ng/mL  OR                                         > 80% decrease in PCT                                      Discontinue / Do not initiate                                             antibiotics  Performed at [facility], [address] [place], [place] [address]   CBC with Differential/Platelet     Status: Abnormal   Collection Time: 09/18/20  5:48 AM  Result Value Ref Range   WBC 12.6 (H) 4.0 - 10.5 K/uL   RBC 3.90 3.87 - 5.11 MIL/uL   Hemoglobin 11.7 (L) 12.0 - 15.0 g/dL   HCT 34.5 (L) 36.0 - 46.0 %   MCV 88.5 80.0 - 100.0 fL   MCH 30.0 26.0 - 34.0 pg   MCHC 33.9 30.0 - 36.0 g/dL   RDW 14.1 11.5 - 15.5 %   Platelets 161 150 - 400 K/uL   nRBC 0.0 0.0 - 0.2 %   Neutrophils Relative % 77 %   Neutro Abs 9.8 (H) 1.7 - 7.7 K/uL   Lymphocytes Relative 15 %   Lymphs Abs 1.8 0.7 - 4.0 K/uL   Monocytes Relative 6 %   Monocytes Absolute 0.7 0.1 - 1.0 K/uL   Eosinophils Relative 0 %   Eosinophils Absolute 0.0 0.0 - 0.5 K/uL   Basophils Relative 0 %   Basophils Absolute 0.1 0.0 - 0.1 K/uL   Immature Granulocytes 2 %   Abs Immature Granulocytes 0.26 (H) 0.00 - 0.07 K/uL    Comment: Performed at [facility], [address] [place], [place] [address]  Renal function panel     Status: Abnormal   Collection Time: 09/18/20  5:48 AM  Result Value Ref Range   Sodium 137 135 - 145 mmol/L   Potassium 4.0 3.5 - 5.1 mmol/L   Chloride 103 98 - 111 mmol/L   CO2 27 22 - 32 mmol/L   Glucose, Bld 136 (H) 70 - 99 mg/dL    Comment: Glucose reference range applies only to samples taken after fasting for at least 8 hours.   BUN 16 8 - 23 mg/dL   Creatinine, Ser 0.92 0.44 - 1.00 mg/dL   Calcium 8.3 (L) 8.9 - 10.3 mg/dL   Phosphorus 2.5 2.5 - 4.6 mg/dL   Albumin 2.5 (L) 3.5 - 5.0 g/dL   GFR, Estimated >60 >60 mL/min    Comment: (NOTE) Calculated using the CKD-EPI Creatinine Equation (2021)    Anion gap 7 5 - 15    Comment: Performed at [facility], [address], [place], [place]  [address]  Magnesium     Status: None   Collection Time: 09/18/20  5:48 AM  Result Value Ref Range   Magnesium 1.7 1.7 - 2.4 mg/dL    Comment: Performed at [facility], [address] [place], [place] [address]  Glucose, capillary     Status: Abnormal   Collection Time: 09/18/20  7:17 AM  Result Value  Ref Range   Glucose-Capillary 120 (H) 70 - 99 mg/dL    Comment: Glucose reference range applies only to samples taken after fasting for at least 8 hours.  Glucose, capillary     Status: Abnormal   Collection Time: 09/18/20 11:11 AM  Result Value Ref Range   Glucose-Capillary 118 (H) 70 - 99 mg/dL    Comment: Glucose reference range applies only to samples taken after fasting for at least 8 hours.  HIV Antibody (routine testing w rflx)     Status: None   Collection Time: 09/18/20  3:15 PM  Result Value Ref Range   HIV Screen 4th Generation wRfx Non Reactive Non Reactive    Comment: Performed at [facility], [place] [address], [place], [place] [address]  RPR     Status: None   Collection Time: 09/18/20  3:15 PM  Result Value Ref Range   RPR Ser Ql NON REACTIVE NON REACTIVE    Comment: Performed at [facility], [place] [address], [place], [place] [address]  Vitamin B12     Status: None   Collection Time: 09/18/20  3:15 PM  Result Value Ref Range   Vitamin B-12 295 180 - 914 pg/mL    Comment: (NOTE) This assay is not validated for testing neonatal or myeloproliferative syndrome specimens for Vitamin B12 levels. Performed at [facility], [place] [address] [place], [place] [address]   Glucose, capillary     Status: Abnormal   Collection Time: 09/18/20  3:33 PM  Result Value Ref Range   Glucose-Capillary 153 (H) 70 - 99 mg/dL    Comment: Glucose reference range applies only to samples taken after fasting for at least 8 hours.  Glucose, capillary     Status: None   Collection Time: 09/18/20  7:11 PM  Result Value Ref Range   Glucose-Capillary 98 70 - 99 mg/dL     Comment: Glucose reference range applies only to samples taken after fasting for at least 8 hours.  Glucose, capillary     Status: Abnormal   Collection Time: 09/18/20 11:14 PM  Result Value Ref Range   Glucose-Capillary 114 (H) 70 - 99 mg/dL    Comment: Glucose reference range applies only to samples taken after fasting for at least 8 hours.  Glucose, capillary     Status: Abnormal   Collection Time: 09/19/20  3:31 AM  Result Value Ref Range   Glucose-Capillary 136 (H) 70 - 99 mg/dL    Comment: Glucose reference range applies only to samples taken after fasting for at least 8 hours.  CBC with Differential/Platelet     Status: Abnormal   Collection Time: 09/19/20  4:21 AM  Result Value Ref Range   WBC 10.7 (H) 4.0 - 10.5 K/uL   RBC 3.84 (L) 3.87 - 5.11 MIL/uL   Hemoglobin 11.4 (L) 12.0 - 15.0 g/dL   HCT 33.7 (L) 36.0 - 46.0 %   MCV 87.8 80.0 - 100.0 fL   MCH 29.7 26.0 - 34.0 pg   MCHC 33.8 30.0 - 36.0 g/dL   RDW 14.1 11.5 - 15.5 %   Platelets 163 150 - 400 K/uL   nRBC 0.0 0.0 - 0.2 %   Neutrophils Relative % 75 %   Neutro Abs 8.0 (H) 1.7 - 7.7 K/uL   Lymphocytes Relative 18 %   Lymphs Abs 2.0 0.7 - 4.0 K/uL   Monocytes Relative 4 %   Monocytes Absolute 0.5 0.1 - 1.0 K/uL  Eosinophils Relative 1 %   Eosinophils Absolute 0.1 0.0 - 0.5 K/uL   Basophils Relative 0 %   Basophils Absolute 0.0 0.0 - 0.1 K/uL   Immature Granulocytes 2 %   Abs Immature Granulocytes 0.16 (H) 0.00 - 0.07 K/uL    Comment: Performed at [facility], [address] [place], [place] [address]  Renal function panel     Status: Abnormal   Collection Time: 09/19/20  4:21 AM  Result Value Ref Range   Sodium 137 135 - 145 mmol/L   Potassium 3.4 (L) 3.5 - 5.1 mmol/L   Chloride 102 98 - 111 mmol/L   CO2 27 22 - 32 mmol/L   Glucose, Bld 127 (H) 70 - 99 mg/dL    Comment: Glucose reference range applies only to samples taken after fasting for at least 8 hours.   BUN 12 8 - 23 mg/dL   Creatinine, Ser  0.70 0.44 - 1.00 mg/dL   Calcium 8.2 (L) 8.9 - 10.3 mg/dL   Phosphorus 2.7 2.5 - 4.6 mg/dL   Albumin 2.3 (L) 3.5 - 5.0 g/dL   GFR, Estimated >60 >60 mL/min    Comment: (NOTE) Calculated using the CKD-EPI Creatinine Equation (2021)    Anion gap 8 5 - 15    Comment: Performed at [facility], [address] [place], [place] [address]  Magnesium     Status: None   Collection Time: 09/19/20  4:21 AM  Result Value Ref Range   Magnesium 1.8 1.7 - 2.4 mg/dL    Comment: Performed at [facility], [address] [place], [place] [address]  Glucose, capillary     Status: None   Collection Time: 09/19/20  7:31 AM  Result Value Ref Range   Glucose-Capillary 97 70 - 99 mg/dL    Comment: Glucose reference range applies only to samples taken after fasting for at least 8 hours.  Glucose, capillary     Status: Abnormal   Collection Time: 09/19/20 11:23 AM  Result Value Ref Range   Glucose-Capillary 142 (H) 70 - 99 mg/dL    Comment: Glucose reference range applies only to samples taken after fasting for at least 8 hours.  Glucose, capillary     Status: Abnormal   Collection Time: 09/19/20  3:54 PM  Result Value Ref Range   Glucose-Capillary 163 (H) 70 - 99 mg/dL    Comment: Glucose reference range applies only to samples taken after fasting for at least 8 hours.    Current Facility-Administered Medications  Medication Dose Route Frequency Provider Last Rate Last Admin  . 0.9 %  sodium chloride infusion  250 mL Intravenous Continuous Rust-Chester, Britton L, NP   Stopped at 09/18/20 1238  . albuterol (PROVENTIL) (2.5 MG/3ML) 0.083% nebulizer solution 2.5 mg  2.5 mg Nebulization TID Loletha Grayer, MD      . cefTRIAXone (ROCEPHIN) 2 g in sodium chloride 0.9 % 100 mL IVPB  2 g Intravenous Q24H Loletha Grayer, MD   Stopped at 09/18/20 1756  . chlorhexidine gluconate (MEDLINE KIT) (PERIDEX) 0.12 % solution 15 mL  15 mL Mouth Rinse BID Ottie Glazier, MD   15 mL at 09/19/20  0906  . Chlorhexidine Gluconate Cloth 2 % PADS 6 each  6 each Topical Q2200 Ottie Glazier, MD   6 each at 09/19/20 0906  . divalproex (DEPAKOTE SPRINKLE) capsule 500 mg  500 mg Oral Q12H Dallie Piles, RPH   500 mg at 09/19/20 0086  . enoxaparin (LOVENOX) injection  40 mg  40 mg Subcutaneous Q24H Loletha Grayer, MD   40 mg at 09/18/20 2257  . FLUoxetine (PROZAC) capsule 60 mg  60 mg Oral Daily Loletha Grayer, MD   60 mg at 09/19/20 0907  . insulin aspart (novoLOG) injection 0-15 Units  0-15 Units Subcutaneous Q4H Rust-Chester, Britton L, NP   3 Units at 09/19/20 1604  . insulin glargine (LANTUS) injection 6 Units  6 Units Subcutaneous QHS Wieting, Richard, MD      . levothyroxine (SYNTHROID) tablet 88 mcg  88 mcg Oral QAC breakfast Rust-Chester, Huel Cote, NP   88 mcg at 09/19/20 0552  . lurasidone (LATUDA) tablet 20 mg  20 mg Oral Daily Rust-Chester, Britton L, NP   20 mg at 09/19/20 0907  . metroNIDAZOLE (FLAGYL) IVPB 500 mg  500 mg Intravenous Q8H Loletha Grayer, MD   Stopped at 09/19/20 1244  . pantoprazole (PROTONIX) EC tablet 40 mg  40 mg Oral Q24H Loletha Grayer, MD   40 mg at 09/19/20 1604  . thiamine (B-1) injection 100 mg  100 mg Intravenous Daily Bhagat, Srishti L, MD   100 mg at 09/19/20 0906  . vitamin B-12 (CYANOCOBALAMIN) tablet 1,000 mcg  1,000 mcg Oral Daily Bhagat, Srishti L, MD   1,000 mcg at 09/19/20 0907    Musculoskeletal: Strength & Muscle Tone: within normal limits Gait & Station: unsteady Patient leans: N/A            Psychiatric Specialty Exam:  Presentation  General Appearance: No data recorded Eye Contact:No data recorded Speech:No data recorded Speech Volume:No data recorded Handedness:No data recorded  Mood and Affect  Mood:No data recorded Affect:No data recorded  Thought Process  Thought Processes:No data recorded Descriptions of Associations:No data recorded Orientation:No data recorded Thought Content:No data  recorded History of Schizophrenia/Schizoaffective disorder:No data recorded Duration of Psychotic Symptoms:No data recorded Hallucinations:No data recorded Ideas of Reference:No data recorded Suicidal Thoughts:No data recorded Homicidal Thoughts:No data recorded  Sensorium  Memory:No data recorded Judgment:No data recorded Insight:No data recorded  Executive Functions  Concentration:No data recorded Attention Span:No data recorded Recall:No data recorded Fund of Knowledge:No data recorded Language:No data recorded  Psychomotor Activity  Psychomotor Activity:No data recorded  Assets  Assets:No data recorded  Sleep  Sleep:No data recorded  Physical Exam: Physical Exam Vitals and nursing note reviewed.  Constitutional:      Appearance: Normal appearance.  HENT:     Head: Normocephalic and atraumatic.     Mouth/Throat:     Pharynx: Oropharynx is clear.  Eyes:     Pupils: Pupils are equal, round, and reactive to light.  Cardiovascular:     Rate and Rhythm: Normal rate and regular rhythm.  Pulmonary:     Effort: Pulmonary effort is normal.     Breath sounds: Normal breath sounds.  Abdominal:     General: Abdomen is flat.     Palpations: Abdomen is soft.  Musculoskeletal:        General: Normal range of motion.  Skin:    General: Skin is warm and dry.  Neurological:     General: No focal deficit present.     Mental Status: She is alert. Mental status is at baseline.  Psychiatric:        Mood and Affect: Mood normal.        Thought Content: Thought content normal.    Review of Systems  Constitutional: Negative.   HENT: Negative.   Eyes: Negative.   Respiratory: Negative.   Cardiovascular: Negative.  Gastrointestinal: Negative.   Musculoskeletal: Negative.   Skin: Negative.   Neurological: Negative.   Psychiatric/Behavioral: Negative.    Blood pressure 103/76, pulse 81, temperature 98.7 F (37.1 C), temperature source Oral, resp. rate (!) 24, height 5'  3" (1.6 m), weight 76.7 kg, SpO2 96 %. Body mass index is 29.95 kg/m.  Treatment Plan Summary: Plan Looking much better today but I see in the notes that she had episodes of confusion last night.  Possible this will be resolved today or at least better now that she is in a regular floor bed.  I will still make sure we have an order written for as needed Haldol if she becomes confused and agitated at night.  Otherwise her normal psychiatric medicines are appropriate for now and can just be continued.  Disposition: No evidence of imminent risk to self or others at present.   Patient does not meet criteria for psychiatric inpatient admission. Supportive therapy provided about ongoing stressors.  Alethia Berthold, MD 09/19/2020 7:04 PM

## 2020-09-20 ENCOUNTER — Inpatient Hospital Stay: Payer: Medicare Other

## 2020-09-20 DIAGNOSIS — F3112 Bipolar disorder, current episode manic without psychotic features, moderate: Secondary | ICD-10-CM | POA: Diagnosis not present

## 2020-09-20 DIAGNOSIS — Z7189 Other specified counseling: Secondary | ICD-10-CM | POA: Diagnosis not present

## 2020-09-20 DIAGNOSIS — J9601 Acute respiratory failure with hypoxia: Secondary | ICD-10-CM | POA: Diagnosis not present

## 2020-09-20 DIAGNOSIS — Z515 Encounter for palliative care: Secondary | ICD-10-CM

## 2020-09-20 DIAGNOSIS — J690 Pneumonitis due to inhalation of food and vomit: Secondary | ICD-10-CM | POA: Diagnosis not present

## 2020-09-20 DIAGNOSIS — R531 Weakness: Secondary | ICD-10-CM | POA: Diagnosis not present

## 2020-09-20 LAB — BASIC METABOLIC PANEL
Anion gap: 10 (ref 5–15)
BUN: 9 mg/dL (ref 8–23)
CO2: 25 mmol/L (ref 22–32)
Calcium: 8.7 mg/dL — ABNORMAL LOW (ref 8.9–10.3)
Chloride: 101 mmol/L (ref 98–111)
Creatinine, Ser: 0.66 mg/dL (ref 0.44–1.00)
GFR, Estimated: >60 mL/min (ref >60)
Glucose, Bld: 102 mg/dL — ABNORMAL HIGH (ref 70–99)
Potassium: 3.4 mmol/L — ABNORMAL LOW (ref 3.5–5.1)
Sodium: 136 mmol/L (ref 135–145)

## 2020-09-20 LAB — GLUCOSE, CAPILLARY
Glucose-Capillary: 107 mg/dL — ABNORMAL HIGH (ref 70–99)
Glucose-Capillary: 110 mg/dL — ABNORMAL HIGH (ref 70–99)
Glucose-Capillary: 127 mg/dL — ABNORMAL HIGH (ref 70–99)
Glucose-Capillary: 142 mg/dL — ABNORMAL HIGH (ref 70–99)
Glucose-Capillary: 155 mg/dL — ABNORMAL HIGH (ref 70–99)
Glucose-Capillary: 221 mg/dL — ABNORMAL HIGH (ref 70–99)

## 2020-09-20 LAB — CBC
HCT: 37.3 % (ref 36.0–46.0)
Hemoglobin: 12.2 g/dL (ref 12.0–15.0)
MCH: 29.3 pg (ref 26.0–34.0)
MCHC: 32.7 g/dL (ref 30.0–36.0)
MCV: 89.4 fL (ref 80.0–100.0)
Platelets: 195 10*3/uL (ref 150–400)
RBC: 4.17 MIL/uL (ref 3.87–5.11)
RDW: 13.9 % (ref 11.5–15.5)
WBC: 8.8 10*3/uL (ref 4.0–10.5)
nRBC: 0 % (ref 0.0–0.2)

## 2020-09-20 IMAGING — DX DG CHEST 1V PORT
1 series · 1 of 1 positions shown · non-contrast
Comparison: [DATE].

CLINICAL DATA: Aspiration pneumonia.

EXAM:
PORTABLE CHEST 1 VIEW

[chest ap]
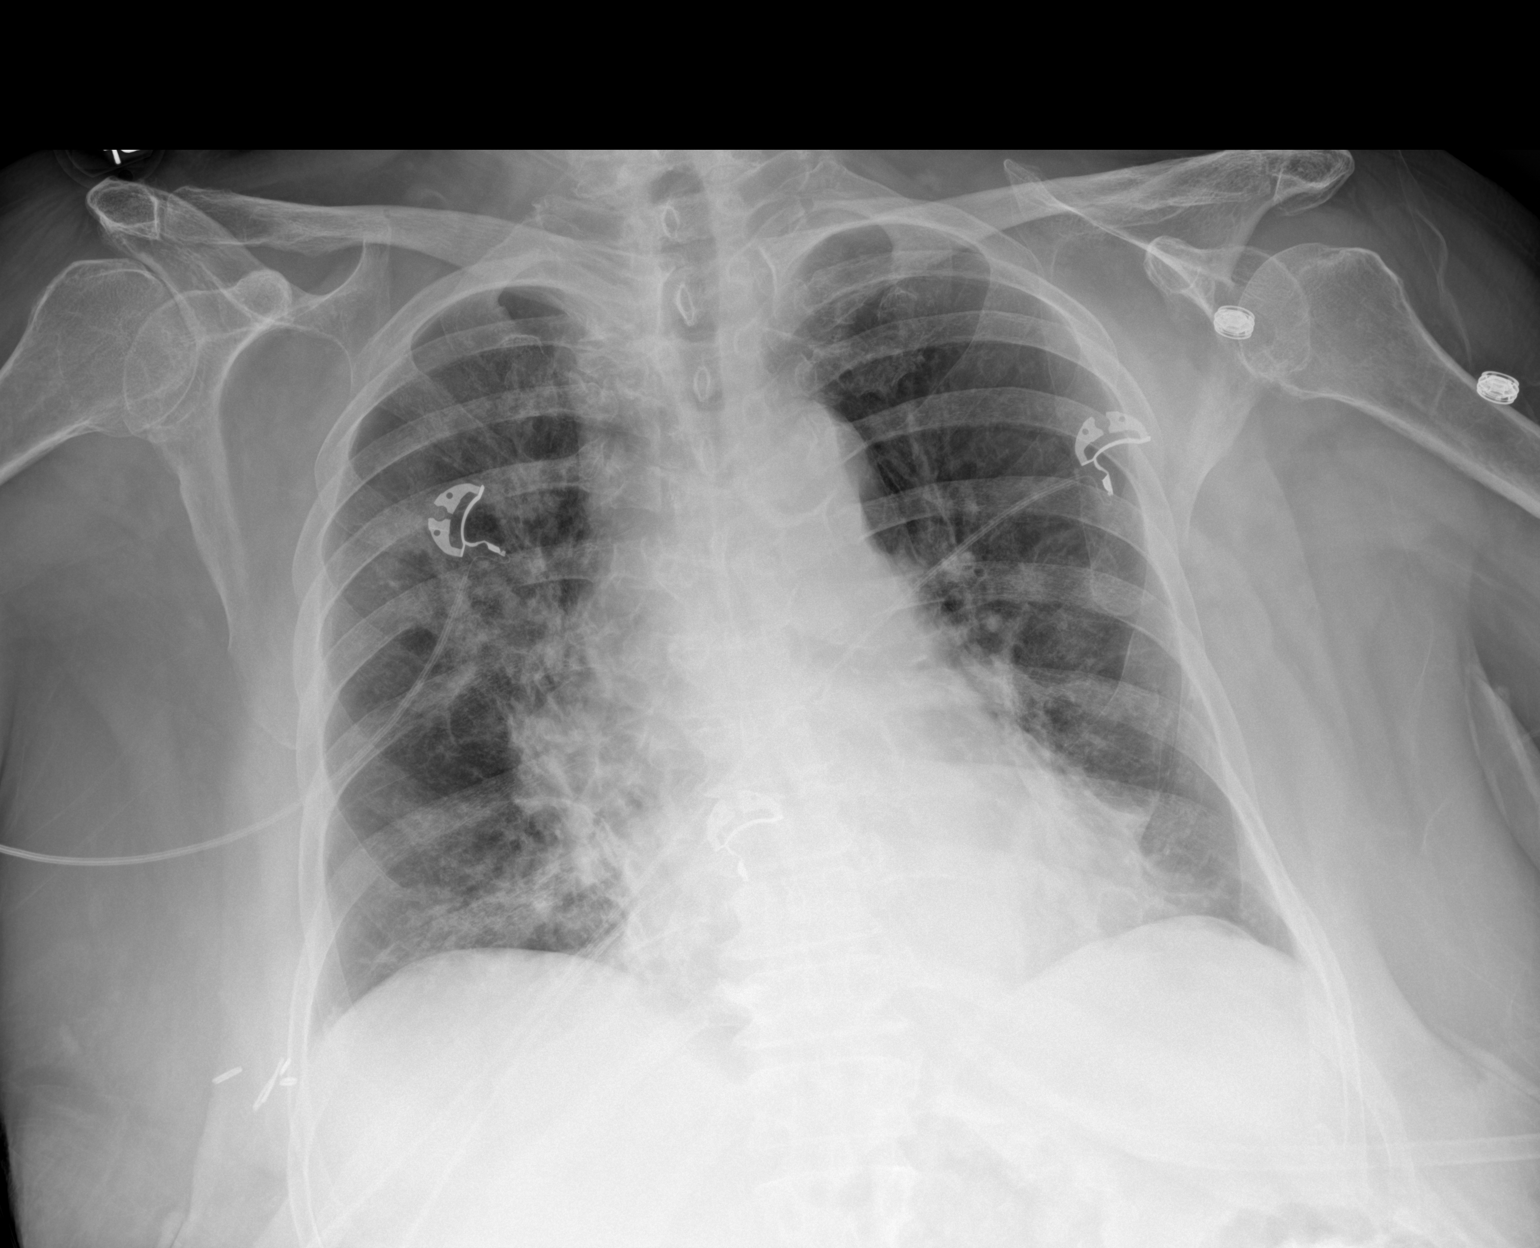

[1 of 1 positions shown; findings below may reference images not displayed]

FINDINGS: Interim extubation and removal of NG tube. Heart size stable.
Persistent bibasilar atelectasis/infiltrates. Right perihilar
atelectasis/infiltrate noted on today's exam. No pleural effusion or
pneumothorax.
IMPRESSION: 1.  Interim extubation and removal of NG tube.

2. Persistent bibasilar atelectasis/infiltrates. Right perihilar
atelectasis/infiltrate noted on today's exam.

## 2020-09-20 MED ORDER — ALBUTEROL SULFATE (2.5 MG/3ML) 0.083% IN NEBU
2.5000 mg | INHALATION_SOLUTION | Freq: Two times a day (BID) | RESPIRATORY_TRACT | Status: DC
  Administered 2020-09-21 (×2): 2.5 mg via RESPIRATORY_TRACT
  Filled 2020-09-20 (×3): qty 3

## 2020-09-20 MED ORDER — POTASSIUM CHLORIDE CRYS ER 20 MEQ PO TBCR
20.0000 meq | EXTENDED_RELEASE_TABLET | Freq: Once | ORAL | Status: AC
  Administered 2020-09-20: 20 meq via ORAL
  Filled 2020-09-20: qty 1

## 2020-09-20 NOTE — Progress Notes (Signed)
NAME:  [name], MRN:  [id number], DOB:  [birth year], LOS: 5 ADMISSION DATE:  09/15/2020, CONSULTATION DATE: 09/15/2020 REFERRING MD: Dr. Kerman Passey, CHIEF COMPLAINT: Shortness of breath  History of Present Illness:  75 year old female arrived at [facility] ED via EMS from her nursing facility where she had been in her normal state of health until choking on a hot dog during her last meal.  Per ED documentation and EMS report the patient was able to cough up chunks of hot dog followed by frothy sputum and some difficulty breathing. Per ED documentation EMS stated the patient's SPO2 was 80%, she was placed on nonrebreather and transported to the ED.   ED course: Upon arrival patient's SPO2 was 90% on 15 L.  Once the patient was settled in a stretcher and sat up having calm down oxygen was able to be titrated to 6 L nasal cannula.  Initial vitals: Afebrile at 97.5, RR 14, NSR 95, BP 104/72 with a MAP of 83, SPO2 94% on 6 L nasal cannula. Significant labs: AKI with BUN/Cr: 27/1.28, leukocytosis at 15.5, hyperglycemia at 193, COVID-19 negative, CXR negative for acute disease.  PCCM consulted to evaluate possible bronchoscopy.   09/16/20- patient continued to cough out aspirated foreign body material but continued to remain hypoxemic and required significant supplemental O2.  She is full code and has no family listed per emergency contact. Donnald Garre called her family friend listed on EMR as next of kin [name]) and we discussed current critically ill state with ongoing hypoxemia. We discussed now patient has failure of BIPAP as well and will require ETT and bronchoscopy for FB aspiration.  09/17/20- patient on spontaneous trial.  She did well after bronchoscopy and was able to be weaned down on FiO2, today plan is to liberate from MV and hopefully transfer out to medical floor with TRH. I reviewed plan with Dr Leslye Peer.   09/20/20 - patient remains on medical floor. There is palliative care consultation  due to clinical signs of indicative of poor prognosis long term.  She continues to have physical deconditioning,   Pertinent  Medical History  Hypertension Hypothyroidism Hypercholesterolemia Type 2 diabetes mellitus Breast cancer -right breast 2004 Arthritis MDD Bipolar disorder PTSD Significant Hospital Events: Including procedures, antibiotic start and stop dates in addition to other pertinent events   . 09/15/20- Patient admitted to PCU after " choking event" and acute oxygen need.  Interim History / Subjective:  Patient alert and responsive sitting up in ED stretcher able to self suction with Yankauer device.  Audible secretions at back of throat the patient able to cough them up and self suction appropriately.  Patient denies any previous events with difficulty swallowing.  Patient admits some mild continued shortness of breath, denies need for oxygen use at home & only other associated symptom is a sore throat when attempting to swallow.  Objective   Blood pressure 122/68, pulse 63, temperature 97.8 F (36.6 C), resp. rate 16, height 5\' 3"  (1.6 m), weight 76.7 kg, SpO2 94 %.        Intake/Output Summary (Last 24 hours) at 09/20/2020 1001 Last data filed at 09/20/2020 0640 Gross per 24 hour  Intake 1014.19 ml  Output 600 ml  Net 414.19 ml   Filed Weights   09/17/20 0443 09/18/20 0500 09/19/20 0500  Weight: 74.3 kg 74.3 kg 76.7 kg    Examination: General: Adult female, acutely ill, lying in bed, NAD HEENT: MM pink/moist, anicteric, atraumatic, glasses in place neck supple Neuro:  A&O x 4, able to follow commands, PERRL +3, MAE CV: s1s2 RRR, NSR-ST on monitor, no r/m/g Pulm: Regular, non labored on 6 L nasal cannula, breath sounds clear-BUL & diminished-BLL GI: soft, rounded, non tender, bs x 4 Skin: Limited exam- no rashes/lesions noted Extremities: warm/dry, pulses + 2 R/P, no edema noted  Labs/imaging that I have personally reviewed  (right click and "Reselect all  SmartList Selections" daily)  Na+/ K+: 135/3.7 BUN/Cr.:  27/1.28 Serum CO2/ AG: 23/14  Hgb: 14.7 WBC/ TMAX: 15.5/afebrile  CXR 09/15/2020: Stable left basilar scarring without acute or active cardiopulmonary disease Resolved Hospital Problem list     Assessment & Plan:  Acute Hypoxic Respiratory Failure secondary to choking event Leukocytosis- 15.5 Suspected Aspiration        -s/p bronchoscopy - aspirated food particles with mucoid debris  -hx of bipolar , aspiration risk is significant with possible dementia development  Acute Kidney Injury in the setting of ACE therapy Baseline Cr:1.0 , Cr on admission:1.28 - Strict I/O's: alert provider if UOP < 0.5 mL/kg/hr - gentle IVF hydration  - Daily BMP, replace electrolytes PRN - Avoid nephrotoxic agents as able, ensure adequate renal perfusion   Best practice (right click and "Reselect all SmartList Selections" daily)  Diet:  NPO Pain/Anxiety/Delirium protocol (if indicated): No VAP protocol (if indicated): Not indicated DVT prophylaxis: SCD * per primary GI prophylaxis: N/A* per primary Glucose control:  SSI Yes Central venous access:  N/A Arterial line:  N/A Foley:  N/A Mobility:  Per primary PT consulted: N/A Last date of multidisciplinary goals of care discussion 09/15/20 Code Status:  full code Disposition: PCU  Labs   CBC: Recent Labs  Lab 09/16/20 0608 09/17/20 0405 09/18/20 0548 09/19/20 0421 09/20/20 0816  WBC 15.2* 15.2* 12.6* 10.7* 8.8  NEUTROABS 13.2*  --  9.8* 8.0*  --   HGB 14.8 12.7 11.7* 11.4* 12.2  HCT 44.2 36.7 34.5* 33.7* 37.3  MCV 89.5 88.2 88.5 87.8 89.4  PLT 246 218 161 163 062    Basic Metabolic Panel: Recent Labs  Lab 09/16/20 0608 09/17/20 0405 09/18/20 0548 09/19/20 0421 09/20/20 0816  NA 136 138 137 137 136  K 4.1 3.2* 4.0 3.4* 3.4*  CL 98 102 103 102 101  CO2 22 26 27 27 25   GLUCOSE 268* 169* 136* 127* 102*  BUN 27* 26* 16 12 9   CREATININE 1.45* 0.96 0.92 0.70 0.66   CALCIUM 9.5 9.1 8.3* 8.2* 8.7*  MG 1.5* 1.7 1.7 1.8  --   PHOS 3.4 2.7 2.5 2.7  --    GFR: Estimated Creatinine Clearance: 59.6 mL/min (by C-G formula based on SCr of 0.66 mg/dL). Recent Labs  Lab 09/16/20 0608 09/17/20 0405 09/18/20 0548 09/19/20 0421 09/20/20 0816  PROCALCITON 0.84 2.60 1.12  --   --   WBC 15.2* 15.2* 12.6* 10.7* 8.8    Liver Function Tests: Recent Labs  Lab 09/15/20 2151 09/15/20 2259 09/16/20 0608 09/18/20 0548 09/19/20 0421  AST 16 15 18   --   --   ALT 10 10 10   --   --   ALKPHOS 44 44 44  --   --   BILITOT 0.8 0.8 1.3*  --   --   PROT 7.5 7.4 7.2  --   --   ALBUMIN 3.6 3.7 3.3* 2.5* 2.3*   No results for input(s): LIPASE, AMYLASE in the last 168 hours. No results for input(s): AMMONIA in the last 168 hours.  ABG    Component  Value Date/Time   PHART 7.36 09/16/2020 1003   PCO2ART 48 09/16/2020 1003   PO2ART 66 (L) 09/16/2020 1003   HCO3 27.1 09/16/2020 1003   O2SAT 91.9 09/16/2020 1003     Coagulation Profile: No results for input(s): INR, PROTIME in the last 168 hours.  Cardiac Enzymes: No results for input(s): CKTOTAL, CKMB, CKMBINDEX, TROPONINI in the last 168 hours.  HbA1C: Hemoglobin A1C  Date/Time Value Ref Range Status  03/26/2012 06:17 AM 6.7 (H) 4.2 - 6.3 % Final    Comment:    The American Diabetes Association recommends that a primary goal of therapy should be <7% and that physicians should reevaluate the treatment regimen in patients with HbA1c values consistently >8%.    Hgb A1c MFr Bld  Date/Time Value Ref Range Status  07/24/2020 01:50 PM 14.2 (H) 4.8 - 5.6 % Final    Comment:    (NOTE) Pre diabetes:          5.7%-6.4%  Diabetes:              >6.4%  Glycemic control for   <7.0% adults with diabetes   01/10/2020 03:12 PM 9.2 (H) 4.8 - 5.6 % Final    Comment:    (NOTE)         Prediabetes: 5.7 - 6.4         Diabetes: >6.4         Glycemic control for adults with diabetes: <7.0     CBG: Recent Labs   Lab 09/19/20 2043 09/19/20 2104 09/20/20 0024 09/20/20 0514 09/20/20 0753  GLUCAP 153* 150* 142* 110* 107*    Review of Systems: Positives in BOLD  Gen: Denies fever, chills, weight change, fatigue, night sweats HEENT: Denies blurred vision, double vision, hearing loss, tinnitus, sinus congestion, rhinorrhea, sore throat, neck stiffness, dysphagia PULM: Denies shortness of breath, cough, sputum production, hemoptysis, wheezing CV: Denies chest pain, edema, orthopnea, paroxysmal nocturnal dyspnea, palpitations GI: Denies abdominal pain, nausea, vomiting, diarrhea, hematochezia, melena, constipation, change in bowel habits GU: Denies dysuria, hematuria, polyuria, oliguria, urethral discharge Endocrine: Denies hot or cold intolerance, polyuria, polyphagia or appetite change Derm: Denies rash, dry skin, scaling or peeling skin change Heme: Denies easy bruising, bleeding, bleeding gums Neuro: Denies headache, numbness, weakness, slurred speech, loss of memory or consciousness Past Medical History:  She,  has a past medical history of Arthritis, Breast cancer ([place]), Cancer ([place]) (2004), Diabetes mellitus without complication ([place]), Hypercholesterolemia, Hypertension, Hypothyroid, and Seasonal allergies.   Surgical History:   Past Surgical History:  Procedure Laterality Date  . ABDOMINAL HYSTERECTOMY    . BREAST BIOPSY Left    neg  . BREAST EXCISIONAL BIOPSY Right 2004   breast ca and lumpectomy  . BREAST LUMPECTOMY Right 2004   breast ca  . CHOLECYSTECTOMY    . COLONOSCOPY WITH PROPOFOL N/A 02/13/2017   Procedure: COLONOSCOPY WITH PROPOFOL;  Surgeon: Jonathon Bellows, MD;  Location: [facility] ENDOSCOPY;  Service: Gastroenterology;  Laterality: N/A;  . KIDNEY STONE SURGERY       Social History:   reports that she has quit smoking. Her smoking use included cigarettes. She has never used smokeless tobacco. She reports that she does not drink alcohol and does not use drugs.   Family History:   Her family history is negative for Bladder Cancer and Kidney cancer.   Allergies Allergies  Allergen Reactions  . Navane [Thiothixene] Other (See Comments)    Reaction:  Unknown  . Penicillins Rash    Has patient  had a PCN reaction causing immediate rash, facial/tongue/throat swelling, SOB or lightheadedness with hypotension: No Has patient had a PCN reaction causing severe rash involving mucus membranes or skin necrosis: No Has patient had a PCN reaction that required hospitalization: No Has patient had a PCN reaction occurring within the last 10 years: No If all of the above answers are "NO", then may proceed with Cephalosporin use.  Has patient had a PCN reaction causing immediate rash, facial/tongue/throat swelling, SOB or lightheadedness with hypotension: No Has patient had a PCN reaction causing severe rash involving mucus membranes or skin necrosis: No Has patient had a PCN reaction that required hospitalization: No Has patient had a PCN reaction occurring within the last 10 years: No If all of the above answers are "NO", then may proceed with Cephalosporin use. Has patient had a PCN reaction causing immediate rash, facial/tongue/throat swelling, SOB or lightheadedness with hypotension: No Has patient had a PCN reaction causing severe rash involving mucus membranes or skin necrosis: No Has patient had a PCN reaction that required hospitalization: No Has patient had a PCN reaction occurring within the last 10 years: No If all of the above answers are "NO", then may proceed with Cephalosporin use.     Home Medications  Prior to Admission medications   Medication Sig Start Date End Date Taking? Authorizing Provider  clonazePAM (KLONOPIN) 1 MG tablet Take 1 tablet (1 mg total) by mouth 2 (two) times daily. 08/24/20   Lorella Nimrod, MD  divalproex (DEPAKOTE ER) 500 MG 24 hr tablet Take 2 tablets (1,000 mg total) by mouth at bedtime. 01/11/20   Clapacs, Madie Reno, MD  enalapril (VASOTEC)  5 MG tablet Take 1 tablet (5 mg total) by mouth 2 (two) times daily. 01/11/20   Clapacs, Madie Reno, MD  FLUoxetine (PROZAC) 20 MG capsule Take 60 mg by mouth daily.    [provider]  insulin detemir (LEVEMIR) 100 UNIT/ML injection Inject 0.15 mLs (15 Units total) into the skin daily. 07/31/20   Charlynne Cousins, MD  LATUDA 20 MG TABS tablet Take 20 mg by mouth daily. 08/17/20   [provider]  levothyroxine (SYNTHROID) 88 MCG tablet Take 88 mcg by mouth daily before breakfast.    [provider]  linagliptin (TRADJENTA) 5 MG TABS tablet Take 5 mg by mouth daily.    [provider]  loratadine (CLARITIN) 10 MG tablet Take 1 tablet (10 mg total) by mouth daily. 01/11/20   Clapacs, Madie Reno, MD  metFORMIN (GLUCOPHAGE) 500 MG tablet Take 2 tablets (1,000 mg total) by mouth 2 (two) times daily with a meal. 07/31/20   Charlynne Cousins, MD  ondansetron (ZOFRAN-ODT) 4 MG disintegrating tablet Take 4 mg by mouth every 6 (six) hours as needed for nausea. 08/16/20   [provider]  rosuvastatin (CRESTOR) 40 MG tablet Take 1 tablet (40 mg total) by mouth daily. 01/11/20   Clapacs, Madie Reno, MD          Ottie Glazier, M.D.  [facility] [place]

## 2020-09-20 NOTE — TOC Progression Note (Signed)
Transition of Care [facility]) - Progression Note    Patient Details  Name: [name] MRN: [id number] Date of Birth: 02-Aug-1945  Transition of Care [facility]) CM/SW [place], RN Phone Number: 09/20/2020, 9:41 AM  Clinical Narrative:    Attempted to update PASSR that has end date 08/31/20, received  an error saying another agency has already started, I called Tonya at [facility] to notify and asked if they had started it already   Expected Discharge Plan: [place] Barriers to Discharge: Continued Medical Work up  Expected Discharge Plan and Services Expected Discharge Plan: [place] In-house Referral: Clinical Social Work   Post Acute Care Choice: Nursing Home ([place]) Living arrangements for the past 2 months: [place]                                       Social Determinants of Health (SDOH) Interventions    Readmission Risk Interventions Readmission Risk Prevention Plan 08/24/2020  PCP or Specialist Appt within 5-7 Days Complete  Medication Review (RN CM) Complete  Some recent data might be hidden

## 2020-09-20 NOTE — Progress Notes (Signed)
PROGRESS NOTE    [name]  ZSW:[id number] DOB: 1946/05/06 DOA: 09/15/2020 PCP: Mechele Claude, FNP   Assessment & Plan:   Principal Problem:   Bipolar 1 disorder with moderate mania ([place]) Active Problems:   Essential hypertension   Hypothyroidism   Dyslipidemia   Type 2 diabetes mellitus with hyperlipidemia ([place])   AKI (acute kidney injury) ([place])   Aspiration pneumonia ([place])   Acute respiratory failure with hypoxia ([place])   Acute hypoxemic respiratory failure ([place])   Acute hypoxic respiratory failure: likely secondary to aspiration. S/p intubation & bronchoscopy. Extubated on 09/17/20. Continue on supplemental oxygen and wean as tolerated. Continue on IV flagyl & rocephin   Likely aspiration pneumonia: continue on IV flagyl, rocephin, bronchodilators and encourage incentive spirometry   Hypokalemia: KCl repleated. Will continue to monitor   Leukocytosis: reactive vs infection. Continue on IV abxs   Hypothyroidism: continue on home dose of levothyroxine  DM2: likely poorly controlled. Continue on lantus, SSI w/ accuchecks  Bipolar disorder: continue on home dose of depakote, fluoxetine, latuda. Haldol prn   Weakness: PT/PT recs SNF   DVT prophylaxis: lovenox  Code Status: full  Family Communication:  Disposition Plan: likely d/c to SNF  Level of care: Med-Surg   Status is: Inpatient  Remains inpatient appropriate because:Unsafe d/c plan and Inpatient level of care appropriate due to severity of illness   Dispo: The patient is from: Home              Anticipated d/c is to: SNF              Patient currently is medically stable to d/c.   Difficult to place patient No     Consultants:   Psych    Procedures:    Antimicrobials: rocephin, flagyl    Subjective: Pt c/o malaise   Objective: Vitals:   09/19/20 2047 09/20/20 0022 09/20/20 0455 09/20/20 0530  BP: 113/72 118/79 116/71   Pulse: 77 74 65   Resp: _0 Temp: 98.3 F (36.8  C) 98.1 F (36.7 C) 98.8 F (37.1 C)   TempSrc:      SpO2: 94% 92% 92% 94%  Weight:      Height:        Intake/Output Summary (Last 24 hours) at 09/20/2020 0742 Last data filed at 09/20/2020 0640 Gross per 24 hour  Intake 1254.19 ml  Output 600 ml  Net 654.19 ml   Filed Weights   09/17/20 0443 09/18/20 0500 09/19/20 0500  Weight: 74.3 kg 74.3 kg 76.7 kg    Examination:  General exam: Appears calm and comfortable  Respiratory system: Clear to auscultation. Respiratory effort normal. Cardiovascular system: S1 & S2+. No rubs, gallops or clicks.  Gastrointestinal system: Abdomen is nondistended, soft and nontender. Normal bowel sounds heard. Central nervous system: Alert and oriented. Moves all extremities  Psychiatry: Judgement and insight appear normal. Flat mood and affect     Data Reviewed: I have personally reviewed following labs and imaging studies  CBC: Recent Labs  Lab 09/15/20 2052 09/16/20 0608 09/17/20 0405 09/18/20 0548 09/19/20 0421  WBC 15.5* 15.2* 15.2* 12.6* 10.7*  NEUTROABS  --  13.2*  --  9.8* 8.0*  HGB 14.7 14.8 12.7 11.7* 11.4*  HCT 43.6 44.2 36.7 34.5* 33.7*  MCV 88.6 89.5 88.2 88.5 87.8  PLT 293 246 218 161 322   Basic Metabolic Panel: Recent Labs  Lab 09/15/20 2259 09/16/20 0608 09/17/20 0405 09/18/20 0548 09/19/20 0421  NA  136 136 138 137 137  K 3.7 4.1 3.2* 4.0 3.4*  CL 99 98 102 103 102  CO2 _0 GLUCOSE 175* 268* 169* 136* 127*  BUN 27* 27* 26* 16 12  CREATININE 1.24* 1.45* 0.96 0.92 0.70  CALCIUM 9.9 9.5 9.1 8.3* 8.2*  MG  --  1.5* 1.7 1.7 1.8  PHOS  --  3.4 2.7 2.5 2.7   GFR: Estimated Creatinine Clearance: 59.6 mL/min (by C-G formula based on SCr of 0.7 mg/dL). Liver Function Tests: Recent Labs  Lab 09/15/20 2151 09/15/20 2259 09/16/20 0608 09/18/20 0548 09/19/20 0421  AST _1 --   --   ALT _2 --   --   ALKPHOS 44 44 44  --   --   BILITOT 0.8 0.8 1.3*  --   --   PROT 7.5 7.4 7.2  --   --    ALBUMIN 3.6 3.7 3.3* 2.5* 2.3*   No results for input(s): LIPASE, AMYLASE in the last 168 hours. No results for input(s): AMMONIA in the last 168 hours. Coagulation Profile: No results for input(s): INR, PROTIME in the last 168 hours. Cardiac Enzymes: No results for input(s): CKTOTAL, CKMB, CKMBINDEX, TROPONINI in the last 168 hours. BNP (last 3 results) No results for input(s): PROBNP in the last 8760 hours. HbA1C: No results for input(s): HGBA1C in the last 72 hours. CBG: Recent Labs  Lab 09/19/20 1554 09/19/20 2043 09/19/20 2104 09/20/20 0024 09/20/20 0514  GLUCAP 163* 153* 150* 142* 110*   Lipid Profile: No results for input(s): CHOL, HDL, LDLCALC, TRIG, CHOLHDL, LDLDIRECT in the last 72 hours. Thyroid Function Tests: No results for input(s): TSH, T4TOTAL, FREET4, T3FREE, THYROIDAB in the last 72 hours. Anemia Panel: Recent Labs    09/18/20 1515  VITAMINB12 295   Sepsis Labs: Recent Labs  Lab 09/16/20 0608 09/17/20 0405 09/18/20 0548  PROCALCITON 0.84 2.60 1.12    Recent Results (from the past 240 hour(s))  Resp Panel by RT-PCR (Flu A&B, Covid) Nasopharyngeal Swab     Status: None   Collection Time: 09/15/20  8:52 PM   Specimen: Nasopharyngeal Swab; Nasopharyngeal(NP) swabs in vial transport medium  Result Value Ref Range Status   SARS Coronavirus 2 by RT PCR NEGATIVE NEGATIVE Final    Comment: (NOTE) SARS-CoV-2 target nucleic acids are NOT DETECTED.  The SARS-CoV-2 RNA is generally detectable in upper respiratory specimens during the acute phase of infection. The lowest concentration of SARS-CoV-2 viral copies this assay can detect is 138 copies/mL. A negative result does not preclude SARS-Cov-2 infection and should not be used as the sole basis for treatment or other patient management decisions. A negative result may occur with  improper specimen collection/handling, submission of specimen other than nasopharyngeal swab, presence of viral mutation(s)  within the areas targeted by this assay, and inadequate number of viral copies(<138 copies/mL). A negative result must be combined with clinical observations, patient history, and epidemiological information. The expected result is Negative.  Fact Sheet for Patients:  EntrepreneurPulse.com.au  Fact Sheet for Healthcare Providers:  IncredibleEmployment.be  This test is no t yet approved or cleared by the [place] FDA and  has been authorized for detection and/or diagnosis of SARS-CoV-2 by FDA under an Emergency Use Authorization (EUA). This EUA will remain  in effect (meaning this test can be used) for the duration of the COVID-19 declaration under Section 564(b)(1) of the Act, 21 U.S.C.section 360bbb-3(b)(1), unless the authorization is  terminated  or revoked sooner.       Influenza A by PCR NEGATIVE NEGATIVE Final   Influenza B by PCR NEGATIVE NEGATIVE Final    Comment: (NOTE) The Xpert Xpress SARS-CoV-2/FLU/RSV plus assay is intended as an aid in the diagnosis of influenza from Nasopharyngeal swab specimens and should not be used as a sole basis for treatment. Nasal washings and aspirates are unacceptable for Xpert Xpress SARS-CoV-2/FLU/RSV testing.  Fact Sheet for Patients: EntrepreneurPulse.com.au  Fact Sheet for Healthcare Providers: IncredibleEmployment.be  This test is not yet approved or cleared by the [place] FDA and has been authorized for detection and/or diagnosis of SARS-CoV-2 by FDA under an Emergency Use Authorization (EUA). This EUA will remain in effect (meaning this test can be used) for the duration of the COVID-19 declaration under Section 564(b)(1) of the Act, 21 U.S.C. section 360bbb-3(b)(1), unless the authorization is terminated or revoked.  Performed at [facility], [address] [place], [place] [address]   Culture, blood (Routine X 2) w Reflex to ID  Panel     Status: None (Preliminary result)   Collection Time: 09/16/20  6:08 AM   Specimen: BLOOD  Result Value Ref Range Status   Specimen Description BLOOD RIGHT ANTECUBITAL  Final   Special Requests   Final    BOTTLES DRAWN AEROBIC AND ANAEROBIC Blood Culture adequate volume   Culture   Final    NO GROWTH 3 DAYS Performed at [facility], [address], [place], [place] [address]    Report Status PENDING  Incomplete  Culture, blood (Routine X 2) w Reflex to ID Panel     Status: None (Preliminary result)   Collection Time: 09/16/20  7:24 AM   Specimen: BLOOD  Result Value Ref Range Status   Specimen Description BLOOD BLOOD LEFT HAND  Final   Special Requests   Final    BOTTLES DRAWN AEROBIC AND ANAEROBIC Blood Culture adequate volume   Culture   Final    NO GROWTH 3 DAYS Performed at [facility], [address] [place], [place] [address]    Report Status PENDING  Incomplete  Culture, BAL-quantitative w Gram Stain     Status: Abnormal   Collection Time: 09/16/20 10:31 AM   Specimen: Bronchoalveolar Lavage; Respiratory  Result Value Ref Range Status   Specimen Description   Final    BRONCHIAL ALVEOLAR LAVAGE Performed at [facility], [address], [place], [place] [address]    Special Requests   Final    NONE Performed at [facility], [place], [place] [address]    Gram Stain   Final    MODERATE WBC PRESENT,BOTH PMN AND MONONUCLEAR NO ORGANISMS SEEN    Culture (A)  Final    10,000 COLONIES/mL Normal respiratory flora-no Staph aureus or Pseudomonas seen Performed at [facility] [address] [place], [place] [address]    Report Status 09/18/2020 FINAL  Final  Culture, BAL-quantitative w Gram Stain     Status: Abnormal   Collection Time: 09/16/20 10:31 AM   Specimen: Bronchial Wash; Respiratory  Result Value Ref Range Status   Specimen Description   Final    BRONCHIAL WASHINGS Performed at  [facility], [address] [place], [place] [address]    Special Requests   Final    TRACHEAL ASPIRATE Performed at [facility], [address] [place], [place] [address]    Gram Stain   Final  ABUNDANT WBC PRESENT, PREDOMINANTLY PMN RARE GRAM POSITIVE COCCI    Culture (A)  Final    20,000 COLONIES/mL Normal respiratory flora-no Staph aureus or Pseudomonas seen Performed at [facility] [address] [place], [place] [address]    Report Status 09/18/2020 FINAL  Final  MRSA PCR Screening     Status: None   Collection Time: 09/16/20  1:30 PM   Specimen: Nasopharyngeal  Result Value Ref Range Status   MRSA by PCR NEGATIVE NEGATIVE Final    Comment:        The GeneXpert MRSA Assay (FDA approved for NASAL specimens only), is one component of a comprehensive MRSA colonization surveillance program. It is not intended to diagnose MRSA infection nor to guide or monitor treatment for MRSA infections. Performed at [facility], [address] [place], [place] [address]          Radiology Studies: MR BRAIN WO CONTRAST  Result Date: 09/18/2020 CLINICAL DATA:  [place] EXAM: MRI HEAD WITHOUT CONTRAST TECHNIQUE: Multiplanar, multiecho pulse sequences of the brain and surrounding structures were obtained without intravenous contrast. COMPARISON:  04/06/2020 FINDINGS: Brain: No acute infarct, mass effect or extra-axial collection. No acute or chronic hemorrhage. Hyperintense T2-weighted signal is moderately widespread throughout the white matter. Generalized volume loss without a clear lobar predilection. The midline structures are normal. Vascular: Major flow voids are preserved. Skull and upper cervical spine: Normal calvarium and skull base. Visualized upper cervical spine and soft tissues are normal. Sinuses/Orbits:No paranasal sinus fluid levels or advanced mucosal thickening. No mastoid or middle ear effusion. Normal orbits. IMPRESSION: 1.  No acute intracranial abnormality. 2. Generalized volume loss and findings of chronic small vessel disease. Electronically Signed   By: Ulyses Jarred M.D.   On: 09/18/2020 20:32   DG Chest Port 1 View  Result Date: 09/20/2020 CLINICAL DATA:  Aspiration pneumonia. EXAM: PORTABLE CHEST 1 VIEW COMPARISON:  09/17/2020. FINDINGS: Interim extubation and removal of NG tube. Heart size stable. Persistent bibasilar atelectasis/infiltrates. Right perihilar atelectasis/infiltrate noted on today's exam. No pleural effusion or pneumothorax. IMPRESSION: 1.  Interim extubation and removal of NG tube. 2. Persistent bibasilar atelectasis/infiltrates. Right perihilar atelectasis/infiltrate noted on today's exam. Electronically Signed   By: [place]   On: 09/20/2020 05:59   EEG adult  Result Date: 09/18/2020 Lora Havens, MD     09/18/2020  6:14 PM Patient Name: Hatsuko Bizzarro MRN: [id number] Epilepsy Attending: Lora Havens Referring Physician/Provider: Dr Lesleigh Noe Date: 09/18/2020 Duration: 29.27 mins Patient history: 75yo F with ams. EEG to evaluate for seizure Level of alertness: Awake AEDs during EEG study: Technical aspects: This EEG study was done with scalp electrodes positioned according to the 10-20 International system of electrode placement. Electrical activity was acquired at a sampling rate of _0  and reviewed with a high frequency filter of _1  and a low frequency filter of _2 . EEG data were recorded continuously and digitally stored. Description: The posterior dominant rhythm consists of 7 Hz activity of moderate voltage (25-35 uV) seen predominantly in posterior head regions, symmetric and reactive to eye opening and eye closing. EEG showed continuous generalized 5 to 6 Hz theta slowing. Physiologic photic driving was not seen during photic stimulation.  Hyperventilation was not performed.   ABNORMALITY - Continuous slow, generalized - Background slow IMPRESSION: This study is  suggestive of mild to moderate diffuse encephalopathy, nonspecific etiology. No seizures or epileptiform discharges were seen throughout the recording. [place]   CT HEAD  CODE STROKE WO CONTRAST`  Result Date: 09/18/2020 CLINICAL DATA:  Code stroke.  Altered mental status EXAM: CT HEAD WITHOUT CONTRAST TECHNIQUE: Contiguous axial images were obtained from the base of the skull through the vertex without intravenous contrast. COMPARISON:  07/24/2020 FINDINGS: Brain: There is no acute intracranial hemorrhage, mass effect, or edema. Gray-white differentiation remains preserved. Patchy and confluent areas of hypoattenuation in the supratentorial white matter nonspecific but probably reflect similar moderate to marked chronic microvascular ischemic changes. Prominence of the ventricles and sulci reflects similar generalized parenchymal volume loss. No extra-axial collection. Vascular: No hyperdense vessel or unexpected calcification. Skull: Unremarkable. Sinuses/Orbits: No acute finding. Other: Mastoid air cells are clear. ASPECTS ([place] Stroke Program Early CT Score) - Ganglionic level infarction (caudate, lentiform nuclei, internal capsule, insula, M1-M3 cortex): 7 - Supraganglionic infarction (M4-M6 cortex): 3 Total score (0-10 with 10 being normal): 10 IMPRESSION: There is no acute intracranial hemorrhage or evidence of acute infarction. ASPECT score is 10. Stable chronic/nonemergent findings detailed above. These results were communicated to at 1:09 pmon 5/2/2022by text page via the [facility] messaging system. Electronically Signed   By: Macy Mis M.D.   On: 09/18/2020 13:16        Scheduled Meds: . albuterol  2.5 mg Nebulization TID  . chlorhexidine gluconate (MEDLINE KIT)  15 mL Mouth Rinse BID  . Chlorhexidine Gluconate Cloth  6 each Topical Q2200  . divalproex  500 mg Oral Q12H  . enoxaparin (LOVENOX) injection  40 mg Subcutaneous Q24H  . FLUoxetine  60 mg Oral Daily  . insulin aspart   0-15 Units Subcutaneous Q4H  . insulin glargine  6 Units Subcutaneous QHS  . levothyroxine  88 mcg Oral QAC breakfast  . lurasidone  20 mg Oral Daily  . pantoprazole  40 mg Oral Q24H  . thiamine injection  100 mg Intravenous Daily  . vitamin B-12  1,000 mcg Oral Daily   Continuous Infusions: . sodium chloride Stopped (09/20/20 0523)  . cefTRIAXone (ROCEPHIN)  IV Stopped (09/19/20 2009)  . metronidazole Stopped (09/20/20 0437)     LOS: 5 days    Time spent: 33 mins    Wyvonnia Dusky, MD [facility] Pager [phone]  If 7PM-7AM, please contact night-coverage 09/20/2020, 7:42 AM

## 2020-09-20 NOTE — Consult Note (Signed)
[facility] Consult   Reason for Consult: Follow-up for this patient with bipolar disorder.  Patient seen in her room today. Referring Physician: Jimmye Norman Patient Identification: [name] MRN:  [id number] Principal Diagnosis: Bipolar 1 disorder with moderate mania ([place]) Diagnosis:  Principal Problem:   Bipolar 1 disorder with moderate mania ([place]) Active Problems:   Essential hypertension   Hypothyroidism   Dyslipidemia   Type 2 diabetes mellitus with hyperlipidemia ([place])   AKI (acute kidney injury) ([place])   Aspiration pneumonia ([place])   Acute respiratory failure with hypoxia ([place])   Acute hypoxemic respiratory failure ([place])   Total Time spent with patient: 30 minutes  Subjective:   [name] is a 75 y.o. female patient admitted with "I feel fine".  HPI: Patient seen.  She was awake and alert.  Somewhat blunted affect but reactive.  Said she felt fine.  Denied hallucinations denied suicidal thoughts.  Reviewed that she met with palliative care today but relieved to see that she is still full code and medically improving.  Past Psychiatric History: Past history of bipolar disorder especially recurrent episodes of agitated depression  Risk to Self:   Risk to Others:   Prior Inpatient Therapy:   Prior Outpatient Therapy:    Past Medical History:  Past Medical History:  Diagnosis Date  . Arthritis   . Breast cancer ([place])   . Cancer [facility]) 2004   right breast ca  . Diabetes mellitus without complication ([place])   . Hypercholesterolemia   . Hypertension   . Hypothyroid   . Seasonal allergies     Past Surgical History:  Procedure Laterality Date  . ABDOMINAL HYSTERECTOMY    . BREAST BIOPSY Left    neg  . BREAST EXCISIONAL BIOPSY Right 2004   breast ca and lumpectomy  . BREAST LUMPECTOMY Right 2004   breast ca  . CHOLECYSTECTOMY    . COLONOSCOPY WITH PROPOFOL N/A 02/13/2017   Procedure: COLONOSCOPY WITH PROPOFOL;  Surgeon: Jonathon Bellows, MD;  Location: [facility] ENDOSCOPY;  Service: Gastroenterology;  Laterality: N/A;  . KIDNEY STONE SURGERY     Family History:  Family History  Problem Relation Age of Onset  . Bladder Cancer Neg Hx   . Kidney cancer Neg Hx    Family Psychiatric  History: See previous Social History:  Social History   Substance and Sexual Activity  Alcohol Use No     Social History   Substance and Sexual Activity  Drug Use No    Social History   Socioeconomic History  . Marital status: Divorced    Spouse name: Not on file  . Number of children: Not on file  . Years of education: Not on file  . Highest education level: Not on file  Occupational History  . Not on file  Tobacco Use  . Smoking status: Former Smoker    Types: Cigarettes  . Smokeless tobacco: Never Used  Vaping Use  . Vaping Use: Never used  Substance and Sexual Activity  . Alcohol use: No  . Drug use: No  . Sexual activity: Not Currently  Other Topics Concern  . Not on file  Social History Narrative  . Not on file   Social Determinants of Health   Financial Resource Strain: Not on file  Food Insecurity: Not on file  Transportation Needs: Not on file  Physical Activity: Not on file  Stress: Not on file  Social Connections: Not on file   Additional Social History:  Allergies:   Allergies  Allergen Reactions  . Navane [Thiothixene] Other (See Comments)    Reaction:  Unknown  . Penicillins Rash    Has patient had a PCN reaction causing immediate rash, facial/tongue/throat swelling, SOB or lightheadedness with hypotension: No Has patient had a PCN reaction causing severe rash involving mucus membranes or skin necrosis: No Has patient had a PCN reaction that required hospitalization: No Has patient had a PCN reaction occurring within the last 10 years: No If all of the above answers are "NO", then may proceed with Cephalosporin use.  Has patient had a PCN reaction causing immediate rash,  facial/tongue/throat swelling, SOB or lightheadedness with hypotension: No Has patient had a PCN reaction causing severe rash involving mucus membranes or skin necrosis: No Has patient had a PCN reaction that required hospitalization: No Has patient had a PCN reaction occurring within the last 10 years: No If all of the above answers are "NO", then may proceed with Cephalosporin use. Has patient had a PCN reaction causing immediate rash, facial/tongue/throat swelling, SOB or lightheadedness with hypotension: No Has patient had a PCN reaction causing severe rash involving mucus membranes or skin necrosis: No Has patient had a PCN reaction that required hospitalization: No Has patient had a PCN reaction occurring within the last 10 years: No If all of the above answers are "NO", then may proceed with Cephalosporin use.    Labs:  Results for orders placed or performed during the hospital encounter of 09/15/20 (from the past 48 hour(s))  Glucose, capillary     Status: Abnormal   Collection Time: 09/18/20 11:14 PM  Result Value Ref Range   Glucose-Capillary 114 (H) 70 - 99 mg/dL    Comment: Glucose reference range applies only to samples taken after fasting for at least 8 hours.  Glucose, capillary     Status: Abnormal   Collection Time: 09/19/20  3:31 AM  Result Value Ref Range   Glucose-Capillary 136 (H) 70 - 99 mg/dL    Comment: Glucose reference range applies only to samples taken after fasting for at least 8 hours.  CBC with Differential/Platelet     Status: Abnormal   Collection Time: 09/19/20  4:21 AM  Result Value Ref Range   WBC 10.7 (H) 4.0 - 10.5 K/uL   RBC 3.84 (L) 3.87 - 5.11 MIL/uL   Hemoglobin 11.4 (L) 12.0 - 15.0 g/dL   HCT 33.7 (L) 36.0 - 46.0 %   MCV 87.8 80.0 - 100.0 fL   MCH 29.7 26.0 - 34.0 pg   MCHC 33.8 30.0 - 36.0 g/dL   RDW 14.1 11.5 - 15.5 %   Platelets 163 150 - 400 K/uL   nRBC 0.0 0.0 - 0.2 %   Neutrophils Relative % 75 %   Neutro Abs 8.0 (H) 1.7 - 7.7  K/uL   Lymphocytes Relative 18 %   Lymphs Abs 2.0 0.7 - 4.0 K/uL   Monocytes Relative 4 %   Monocytes Absolute 0.5 0.1 - 1.0 K/uL   Eosinophils Relative 1 %   Eosinophils Absolute 0.1 0.0 - 0.5 K/uL   Basophils Relative 0 %   Basophils Absolute 0.0 0.0 - 0.1 K/uL   Immature Granulocytes 2 %   Abs Immature Granulocytes 0.16 (H) 0.00 - 0.07 K/uL    Comment: Performed at [facility], [address], [place], [place] [address]  Renal function panel     Status: Abnormal   Collection Time: 09/19/20  4:21 AM  Result Value Ref  Range   Sodium 137 135 - 145 mmol/L   Potassium 3.4 (L) 3.5 - 5.1 mmol/L   Chloride 102 98 - 111 mmol/L   CO2 27 22 - 32 mmol/L   Glucose, Bld 127 (H) 70 - 99 mg/dL    Comment: Glucose reference range applies only to samples taken after fasting for at least 8 hours.   BUN 12 8 - 23 mg/dL   Creatinine, Ser 0.70 0.44 - 1.00 mg/dL   Calcium 8.2 (L) 8.9 - 10.3 mg/dL   Phosphorus 2.7 2.5 - 4.6 mg/dL   Albumin 2.3 (L) 3.5 - 5.0 g/dL   GFR, Estimated >60 >60 mL/min    Comment: (NOTE) Calculated using the CKD-EPI Creatinine Equation (2021)    Anion gap 8 5 - 15    Comment: Performed at [facility], [address], [place], [place] [address]  Magnesium     Status: None   Collection Time: 09/19/20  4:21 AM  Result Value Ref Range   Magnesium 1.8 1.7 - 2.4 mg/dL    Comment: Performed at [facility], [address] [place],  [address]  Glucose, capillary     Status: None   Collection Time: 09/19/20  7:31 AM  Result Value Ref Range   Glucose-Capillary 97 70 - 99 mg/dL    Comment: Glucose reference range applies only to samples taken after fasting for at least 8 hours.  Glucose, capillary     Status: Abnormal   Collection Time: 09/19/20 11:23 AM  Result Value Ref Range   Glucose-Capillary 142 (H) 70 - 99 mg/dL    Comment: Glucose reference range applies only to samples taken after fasting for at least 8 hours.  Glucose,  capillary     Status: Abnormal   Collection Time: 09/19/20  3:54 PM  Result Value Ref Range   Glucose-Capillary 163 (H) 70 - 99 mg/dL    Comment: Glucose reference range applies only to samples taken after fasting for at least 8 hours.  Glucose, capillary     Status: Abnormal   Collection Time: 09/19/20  8:43 PM  Result Value Ref Range   Glucose-Capillary 153 (H) 70 - 99 mg/dL    Comment: Glucose reference range applies only to samples taken after fasting for at least 8 hours.  Glucose, capillary     Status: Abnormal   Collection Time: 09/19/20  9:04 PM  Result Value Ref Range   Glucose-Capillary 150 (H) 70 - 99 mg/dL    Comment: Glucose reference range applies only to samples taken after fasting for at least 8 hours.  Glucose, capillary     Status: Abnormal   Collection Time: 09/20/20 12:24 AM  Result Value Ref Range   Glucose-Capillary 142 (H) 70 - 99 mg/dL    Comment: Glucose reference range applies only to samples taken after fasting for at least 8 hours.  Glucose, capillary     Status: Abnormal   Collection Time: 09/20/20  5:14 AM  Result Value Ref Range   Glucose-Capillary 110 (H) 70 - 99 mg/dL    Comment: Glucose reference range applies only to samples taken after fasting for at least 8 hours.  Glucose, capillary     Status: Abnormal   Collection Time: 09/20/20  7:53 AM  Result Value Ref Range   Glucose-Capillary 107 (H) 70 - 99 mg/dL    Comment: Glucose reference range applies only to samples taken after fasting for at least 8 hours.  Basic metabolic panel  Status: Abnormal   Collection Time: 09/20/20  8:16 AM  Result Value Ref Range   Sodium 136 135 - 145 mmol/L   Potassium 3.4 (L) 3.5 - 5.1 mmol/L   Chloride 101 98 - 111 mmol/L   CO2 25 22 - 32 mmol/L   Glucose, Bld 102 (H) 70 - 99 mg/dL    Comment: Glucose reference range applies only to samples taken after fasting for at least 8 hours.   BUN 9 8 - 23 mg/dL   Creatinine, Ser 0.66 0.44 - 1.00 mg/dL   Calcium 8.7  (L) 8.9 - 10.3 mg/dL   GFR, Estimated >60 >60 mL/min    Comment: (NOTE) Calculated using the CKD-EPI Creatinine Equation (2021)    Anion gap 10 5 - 15    Comment: Performed at [facility], [address] [place], [place] [address]  CBC     Status: None   Collection Time: 09/20/20  8:16 AM  Result Value Ref Range   WBC 8.8 4.0 - 10.5 K/uL   RBC 4.17 3.87 - 5.11 MIL/uL   Hemoglobin 12.2 12.0 - 15.0 g/dL   HCT 37.3 36.0 - 46.0 %   MCV 89.4 80.0 - 100.0 fL   MCH 29.3 26.0 - 34.0 pg   MCHC 32.7 30.0 - 36.0 g/dL   RDW 13.9 11.5 - 15.5 %   Platelets 195 150 - 400 K/uL   nRBC 0.0 0.0 - 0.2 %    Comment: Performed at [facility], [address] [place], [place] [address]  Glucose, capillary     Status: Abnormal   Collection Time: 09/20/20 12:04 PM  Result Value Ref Range   Glucose-Capillary 155 (H) 70 - 99 mg/dL    Comment: Glucose reference range applies only to samples taken after fasting for at least 8 hours.  Glucose, capillary     Status: Abnormal   Collection Time: 09/20/20  4:31 PM  Result Value Ref Range   Glucose-Capillary 127 (H) 70 - 99 mg/dL    Comment: Glucose reference range applies only to samples taken after fasting for at least 8 hours.    Current Facility-Administered Medications  Medication Dose Route Frequency Provider Last Rate Last Admin  . 0.9 %  sodium chloride infusion  250 mL Intravenous Continuous Rust-Chester, Huel Cote, NP   Stopped at 09/20/20 0523  . albuterol (PROVENTIL) (2.5 MG/3ML) 0.083% nebulizer solution 2.5 mg  2.5 mg Nebulization BID Wyvonnia Dusky, MD      . chlorhexidine gluconate (MEDLINE KIT) (PERIDEX) 0.12 % solution 15 mL  15 mL Mouth Rinse BID Ottie Glazier, MD   15 mL at 09/20/20 0826  . Chlorhexidine Gluconate Cloth 2 % PADS 6 each  6 each Topical Q2200 Ottie Glazier, MD   6 each at 09/19/20 2108  . divalproex (DEPAKOTE SPRINKLE) capsule 500 mg  500 mg Oral Q12H Dallie Piles, RPH   500 mg at 09/20/20  0941  . enoxaparin (LOVENOX) injection 40 mg  40 mg Subcutaneous Q24H Loletha Grayer, MD   40 mg at 09/19/20 2108  . FLUoxetine (PROZAC) capsule 60 mg  60 mg Oral Daily Loletha Grayer, MD   60 mg at 09/20/20 0941  . haloperidol lactate (HALDOL) injection 1 mg  1 mg Intravenous Q6H PRN Dara Camargo T, MD      . insulin aspart (novoLOG) injection 0-15 Units  0-15 Units Subcutaneous Q4H Rust-Chester, Britton L, NP   2 Units at 09/20/20 1842  . insulin glargine (LANTUS)  injection 6 Units  6 Units Subcutaneous QHS Loletha Grayer, MD   6 Units at 09/19/20 2107  . levothyroxine (SYNTHROID) tablet 88 mcg  88 mcg Oral QAC breakfast Rust-Chester, Britton L, NP   88 mcg at 09/20/20 0525  . lurasidone (LATUDA) tablet 20 mg  20 mg Oral Daily Rust-Chester, Britton L, NP   20 mg at 09/20/20 1234  . metroNIDAZOLE (FLAGYL) IVPB 500 mg  500 mg Intravenous Q8H Wieting, Richard, MD 100 mL/hr at 09/20/20 1244 500 mg at 09/20/20 1244  . pantoprazole (PROTONIX) EC tablet 40 mg  40 mg Oral Q24H Loletha Grayer, MD   40 mg at 09/20/20 1839  . thiamine (B-1) injection 100 mg  100 mg Intravenous Daily Bhagat, Srishti L, MD   100 mg at 09/20/20 0941  . vitamin B-12 (CYANOCOBALAMIN) tablet 1,000 mcg  1,000 mcg Oral Daily Bhagat, Srishti L, MD   1,000 mcg at 09/20/20 0941    Musculoskeletal: Strength & Muscle Tone: within normal limits Gait & Station: unsteady Patient leans: N/A            Psychiatric Specialty Exam:  Presentation  General Appearance: No data recorded Eye Contact:No data recorded Speech:No data recorded Speech Volume:No data recorded Handedness:No data recorded  Mood and Affect  Mood:No data recorded Affect:No data recorded  Thought Process  Thought Processes:No data recorded Descriptions of Associations:No data recorded Orientation:No data recorded Thought Content:No data recorded History of Schizophrenia/Schizoaffective disorder:No data recorded Duration of Psychotic  Symptoms:No data recorded Hallucinations:No data recorded Ideas of Reference:No data recorded Suicidal Thoughts:No data recorded Homicidal Thoughts:No data recorded  Sensorium  Memory:No data recorded Judgment:No data recorded Insight:No data recorded  Executive Functions  Concentration:No data recorded Attention Span:No data recorded Recall:No data recorded Fund of Knowledge:No data recorded Language:No data recorded  Psychomotor Activity  Psychomotor Activity:No data recorded  Assets  Assets:No data recorded  Sleep  Sleep:No data recorded  Physical Exam: Physical Exam Vitals and nursing note reviewed.  Constitutional:      Appearance: Normal appearance.  HENT:     Head: Normocephalic and atraumatic.     Mouth/Throat:     Pharynx: Oropharynx is clear.  Eyes:     Pupils: Pupils are equal, round, and reactive to light.  Cardiovascular:     Rate and Rhythm: Normal rate and regular rhythm.  Pulmonary:     Effort: Pulmonary effort is normal.     Breath sounds: Normal breath sounds.  Abdominal:     General: Abdomen is flat.     Palpations: Abdomen is soft.  Musculoskeletal:        General: Normal range of motion.  Skin:    General: Skin is warm and dry.  Neurological:     General: No focal deficit present.     Mental Status: She is alert. Mental status is at baseline.  Psychiatric:        Mood and Affect: Mood normal.        Thought Content: Thought content normal.    Review of Systems  Constitutional: Negative.   HENT: Negative.   Eyes: Negative.   Respiratory: Negative.   Cardiovascular: Negative.   Gastrointestinal: Negative.   Musculoskeletal: Negative.   Skin: Negative.   Neurological: Negative.   Psychiatric/Behavioral: Negative.    Blood pressure 112/70, pulse 66, temperature 98.2 F (36.8 C), resp. rate 15, height 5' 3"  (1.6 m), weight 76.7 kg, SpO2 98 %. Body mass index is 29.95 kg/m.  Treatment Plan Summary: Plan No change to  medication  plan.  Supportive therapy and encouragement.  Disposition: No evidence of imminent risk to self or others at present.   Patient does not meet criteria for psychiatric inpatient admission.  Alethia Berthold, MD 09/20/2020 8:10 PM

## 2020-09-20 NOTE — Consult Note (Signed)
Consultation Note Date: 09/20/2020   Patient Name: [name]  DOB: [birth year]  MRN: [id number]  Age / Sex: 75 y.o., female   PCP: Mechele Claude, FNP Referring Physician: Wyvonnia Dusky, MD   REASON FOR CONSULTATION:Establishing goals of care  Palliative Care consult requested for goals of care discussion in this 75 y.o. female with a medical history significant for type 2 diabetes mellitus, hypertension, acquired hypothyroidism, right breast cancer (2014), and bipolar disorder. She presented to the ED from [facility] facility with concerns for potential aspiration after eating a hotdog. Since admission underwent bronchoscopy with aspirate clearance. She is being followed by SLP and Psychiatry.   Clinical Assessment and Goals of Care: I have reviewed medical records including lab results, imaging, Epic notes, and MAR, received report from the bedside RN, and assessed the patient.   I met at the bedside with [name] to discuss diagnosis prognosis, GOC, EOL wishes, disposition and options. She is awake, alert and oriented x4, watching tv. She denies pain or shortness of breath.   No family is at the bedside. I did place a call to patient's son while at the bedside at 10am and after discussions with patient. Unable to reach and voicemail was left. Patient shares her son called yesterday evening and spoke with her stating to her she could make decisions and there is not a need for anyone at the hospital to be calling him about decisions.   I introduced Palliative Medicine as specialized medical care for people living with serious illness. It focuses on providing relief from the symptoms and stress of a serious illness. The goal is to improve quality of life for both the patient and the family.  We discussed a brief life review of the patient, along with her functional and nutritional status. [name] reports she is divorced and has 2 children. [name] her son lives in  [place], [place]. She reports she has not seen him in over 12 years. Her daughter, [name], lives in [place] and she reports they have not seen each other in years longer than her son, acknowledging they do not have a good relationship. She states she stopped high school in 11th grade. 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 She is originally from [place], [place].   Prior to admissions [name] states she was at rehab due to a fall. Prior to that she lived alone or with a friend. She is ambulatory with a walker. Reports good appetite. Able to perform most ADLs independently with some set-up.   We discussed Her current illness and what it means in the larger context of Her on-going co-morbidities. Natural disease trajectory and expectations at EOL were discussed.  Patient is able to speak on her current illness and her awareness. She states she was eating a hot dog at the facility in the dining hall and bit off to much causing her to choke. She shares the more she cough the more it seemed to be lodged in her lower throat area. She laughs stating she was hungry and probably eating to fast also.   A detailed discussion was had today regarding advanced directives.  Concepts specific to code status, artifical feeding and hydration, continued IV antibiotics and rehospitalization. [name] states she does not have an advanced directive. She states in the event of decisions her son, [name] would be her decision maker as he is probably the only family that she has left, although they are distant.  I discussed at length her full code status with consideration to her co-morbidities. Unique is clear she would not want any life prolonging measures such as CPR, life support, or artificial feeding tubes/PEG. I allowed space for patient to acknowledge understanding of her request and she was able to appropriately state her wishes and what this would mean in an emergent event. Education provided based on  her expressed wishes recommendations for DNR/DNI should be put in place. She agreed but also asked if I could try to speak with her son and confirm her wishes with him. She is aware of attempts to reach him.    I discussed the importance of continued conversation with family and their medical providers regarding overall plan of care and treatment options, ensuring decisions are within the context of the patients values and GOCs.  [name] is clear in her expressed goals to continue to treat the treatable and her willingness for required medical interventions. She is hopeful for some stability and improvement, however acknowledges she would not want any forms of life prolonging measures.   Palliative Care services outpatient were explained and offered. Patient verbalized understanding and awareness of palliative's goals and philosophy of care.   Questions and concerns were addressed. [name] was encouraged to call with questions or concerns.  PMT will continue to support holistically as needed.   CODE STATUS: Full code (Patient expressed wishes for no life-prolonging measures, however will attempt to   ADVANCE DIRECTIVES: Primary Decision Maker: patient states son, [name] would be her surrogate decision maker.    SYMPTOM MANAGEMENT: per attending  Palliative Prophylaxis:   Delirium Protocol and Frequent Pain Assessment  PSYCHO-SOCIAL/SPIRITUAL:  Support System: Family  Desire for further Chaplaincy support:No  Additional Recommendations (Limitations, Scope, Preferences):  Full Scope Treatment  Education on hospice/palliative    PAST MEDICAL HISTORY: Past Medical History:  Diagnosis Date  . Arthritis   . Breast cancer ([place])   . Cancer [facility]) 2004   right breast ca  . Diabetes mellitus without complication ([place])   . Hypercholesterolemia   . Hypertension   . Hypothyroid   . Seasonal allergies     ALLERGIES:  is allergic to navane [thiothixene] and  penicillins.   MEDICATIONS:  Current Facility-Administered Medications  Medication Dose Route Frequency Provider Last Rate Last Admin  . 0.9 %  sodium chloride infusion  250 mL Intravenous Continuous Rust-Chester, Huel Cote, NP   Stopped at 09/20/20 0523  . albuterol (PROVENTIL) (2.5 MG/3ML) 0.083% nebulizer solution 2.5 mg  2.5 mg Nebulization BID Wyvonnia Dusky, MD      . cefTRIAXone (ROCEPHIN) 2 g in sodium chloride 0.9 % 100 mL IVPB  2 g Intravenous Q24H Loletha Grayer, MD   Stopped at 09/19/20 2009  . chlorhexidine gluconate (MEDLINE KIT) (PERIDEX) 0.12 % solution 15 mL  15 mL Mouth Rinse BID Ottie Glazier, MD   15 mL at 09/20/20 0826  . Chlorhexidine Gluconate Cloth 2 % PADS 6 each  6 each Topical Q2200 Ottie Glazier, MD   6 each at 09/19/20 2108  . divalproex (DEPAKOTE SPRINKLE) capsule 500 mg  500 mg Oral Q12H Dallie Piles, RPH   500 mg at 09/20/20 0941  . enoxaparin (LOVENOX) injection 40 mg  40 mg Subcutaneous Q24H Loletha Grayer, MD   40 mg at 09/19/20 2108  . FLUoxetine (PROZAC) capsule 60 mg  60 mg Oral Daily Loletha Grayer, MD   60 mg at 09/20/20 0941  . haloperidol lactate (HALDOL) injection 1 mg  1 mg Intravenous Q6H PRN Clapacs, John T, MD      . insulin aspart (novoLOG) injection 0-15 Units  0-15 Units Subcutaneous Q4H Rust-Chester, Britton L, NP   2 Units at 09/20/20 0050  . insulin glargine (LANTUS) injection 6 Units  6 Units Subcutaneous QHS Loletha Grayer, MD   6 Units at 09/19/20 2107  . levothyroxine (SYNTHROID) tablet 88 mcg  88 mcg Oral QAC breakfast Rust-Chester, Britton L, NP   88 mcg at 09/20/20 0525  . lurasidone (LATUDA) tablet 20 mg  20 mg Oral Daily Rust-Chester, Britton L, NP   20 mg at 09/19/20 0907  . metroNIDAZOLE (FLAGYL) IVPB 500 mg  500 mg Intravenous Q8H Loletha Grayer, MD   Stopped at 09/20/20 [phone]  . pantoprazole (PROTONIX) EC tablet 40 mg  40 mg Oral Q24H Loletha Grayer, MD   40 mg at 09/19/20 1604  . thiamine (B-1) injection  100 mg  100 mg Intravenous Daily Bhagat, Srishti L, MD   100 mg at 09/20/20 0941  . vitamin B-12 (CYANOCOBALAMIN) tablet 1,000 mcg  1,000 mcg Oral Daily Bhagat, Srishti L, MD   1,000 mcg at 09/20/20 0941    VITAL SIGNS: BP 122/68 (BP Location: Left Arm)   Pulse 63   Temp 97.8 F (36.6 C)   Resp 16   Ht 5' 3"  (1.6 m)   Wt 76.7 kg   SpO2 94%   BMI 29.95 kg/m  Filed Weights   09/17/20 0443 09/18/20 0500 09/19/20 0500  Weight: 74.3 kg 74.3 kg 76.7 kg    Estimated body mass index is 29.95 kg/m as calculated from the following:   Height as of this encounter: 5' 3"  (1.6 m).   Weight as of this encounter: 76.7 kg.  LABS: CBC:    Component Value Date/Time   WBC 8.8 09/20/2020 0816   HGB 12.2 09/20/2020 0816   HGB 13.9 02/25/2014 0927   HCT 37.3 09/20/2020 0816   HCT 43.7 02/25/2014 0927   PLT 195 09/20/2020 0816   PLT 327 02/25/2014 0927   Comprehensive Metabolic Panel:    Component Value Date/Time   NA 136 09/20/2020 0816   NA 139 02/25/2014 0927   K 3.4 (L) 09/20/2020 0816   K 4.4 02/25/2014 0927   BUN 9 09/20/2020 0816   BUN 13 02/25/2014 0927   CREATININE 0.66 09/20/2020 0816   CREATININE 1.10 02/25/2014 0927   ALBUMIN 2.3 (L) 09/19/2020 0421   ALBUMIN 4.0 02/25/2014 0927     Review of Systems  Neurological: Positive for weakness.  Unless otherwise noted, a complete review of systems is negative.  Physical Exam General: NAD,  chronically-ill appearing Cardiovascular: regular rate and rhythm Pulmonary: diminished bilaterally  Abdomen: soft, nontender, + bowel sounds Extremities: no edema, no joint deformities Skin: no rashes, warm and dry Neurological: AAO x4. Mood appropriate    Prognosis: Unable to determine  Discharge Planning:  [place] for rehab with Palliative care service follow-up  Recommendations: . Full Code-patient states she would not want life prolonging measures/CPR/no intubation/no artificial feeding. Would like to speak  with son before making any changes.  . Continue with current plan of care, patient is clear in expressed goals to treat the treatable. She would like to return to rehab.  . Outpatient Palliative at discharge. . Called son x2 and left VM.  Marland Kitchen PMT will continue to support and follow as needed. Please call team line with urgent needs.   Palliative Performance Scale: PPS 30-40%  Patient expressed understanding and was in agreement with this plan.   Thank you for allowing the Palliative Medicine Team to assist in the care of this patient. Please utilize secure chat with additional questions, if there is no response within 30 minutes please call the above phone number.   Time In: 1150 Time Out: 1245 Time Total: 55 min.   Visit consisted of counseling and education dealing with the complex and emotionally intense issues of symptom management and palliative care in the setting of serious and potentially life-threatening illness.Greater than 50%  of this time was spent counseling and coordinating care related to the above assessment and plan.  Signed by:  Alda Lea, AGPCNP-BC Palliative Medicine Team  Phone: [phone] Pager: [phone] Amion: [place] Team providers are available by phone from 7am to 7pm daily and can be reached through the team cell phone.  Should this patient require assistance outside of these hours, please call the patient's attending physician.

## 2020-09-20 NOTE — TOC Progression Note (Signed)
Transition of Care [facility]) - Progression Note    Patient Details  Name: [name] MRN: [id number] Date of Birth: 06-Dec-1945  [facility]) CM/SW Contact  Su Hilt, RN Phone Number: 09/20/2020, 3:29 PM  Clinical Narrative:   Confirmed with [place] that the patient is there for Short term rehab and can return at [place], She is aware that PASSR is showing started by another agency and she will check to see if it was their Education officer, museum   Expected Discharge Plan: [place] Barriers to Discharge: Continued Medical Work up  Expected Discharge Plan and Services Expected Discharge Plan: [place] In-house Referral: Clinical Social Work   Post Acute Care Choice: Nursing Home (Center) Living arrangements for the past 2 months: [place]                                       Social Determinants of Health (SDOH) Interventions    Readmission Risk Interventions Readmission Risk Prevention Plan 08/24/2020  PCP or Specialist Appt within 5-7 Days Complete  Medication Review (RN CM) Complete  Some recent data might be hidden

## 2020-09-21 DIAGNOSIS — R531 Weakness: Secondary | ICD-10-CM | POA: Diagnosis not present

## 2020-09-21 DIAGNOSIS — J690 Pneumonitis due to inhalation of food and vomit: Secondary | ICD-10-CM | POA: Diagnosis not present

## 2020-09-21 DIAGNOSIS — Z7189 Other specified counseling: Secondary | ICD-10-CM | POA: Diagnosis not present

## 2020-09-21 DIAGNOSIS — F3112 Bipolar disorder, current episode manic without psychotic features, moderate: Secondary | ICD-10-CM | POA: Diagnosis not present

## 2020-09-21 DIAGNOSIS — E1169 Type 2 diabetes mellitus with other specified complication: Secondary | ICD-10-CM | POA: Diagnosis not present

## 2020-09-21 DIAGNOSIS — J9601 Acute respiratory failure with hypoxia: Secondary | ICD-10-CM | POA: Diagnosis not present

## 2020-09-21 LAB — CBC
HCT: 36.2 % (ref 36.0–46.0)
Hemoglobin: 12.3 g/dL (ref 12.0–15.0)
MCH: 29.8 pg (ref 26.0–34.0)
MCHC: 34 g/dL (ref 30.0–36.0)
MCV: 87.7 fL (ref 80.0–100.0)
Platelets: 200 10*3/uL (ref 150–400)
RBC: 4.13 MIL/uL (ref 3.87–5.11)
RDW: 14.1 % (ref 11.5–15.5)
WBC: 7.9 10*3/uL (ref 4.0–10.5)
nRBC: 0 % (ref 0.0–0.2)

## 2020-09-21 LAB — CULTURE, BLOOD (ROUTINE X 2)
Culture: NO GROWTH
Culture: NO GROWTH
Special Requests: ADEQUATE
Special Requests: ADEQUATE

## 2020-09-21 LAB — GLUCOSE, CAPILLARY
Glucose-Capillary: 177 mg/dL — ABNORMAL HIGH (ref 70–99)
Glucose-Capillary: 188 mg/dL — ABNORMAL HIGH (ref 70–99)
Glucose-Capillary: 211 mg/dL — ABNORMAL HIGH (ref 70–99)
Glucose-Capillary: 227 mg/dL — ABNORMAL HIGH (ref 70–99)
Glucose-Capillary: 92 mg/dL (ref 70–99)
Glucose-Capillary: 96 mg/dL (ref 70–99)

## 2020-09-21 LAB — BASIC METABOLIC PANEL
Anion gap: 9 (ref 5–15)
BUN: 8 mg/dL (ref 8–23)
CO2: 27 mmol/L (ref 22–32)
Calcium: 8.6 mg/dL — ABNORMAL LOW (ref 8.9–10.3)
Chloride: 103 mmol/L (ref 98–111)
Creatinine, Ser: 0.74 mg/dL (ref 0.44–1.00)
GFR, Estimated: >60 mL/min (ref >60)
Glucose, Bld: 89 mg/dL (ref 70–99)
Potassium: 3.2 mmol/L — ABNORMAL LOW (ref 3.5–5.1)
Sodium: 139 mmol/L (ref 135–145)

## 2020-09-21 MED ORDER — POTASSIUM CHLORIDE CRYS ER 20 MEQ PO TBCR
20.0000 meq | EXTENDED_RELEASE_TABLET | Freq: Once | ORAL | Status: AC
  Administered 2020-09-21: 20 meq via ORAL
  Filled 2020-09-21: qty 1

## 2020-09-21 NOTE — Progress Notes (Signed)
  Speech Language Pathology Treatment: Dysphagia  Patient Details Name: [name] MRN: [id number] DOB: Jan 03, 1946 Today's Date: 09/21/2020 Time: 1000-1015 SLP Time Calculation (min) (ACUTE ONLY): 15 min  Assessment / Plan / Recommendation Clinical Impression  Skilled treatment session focused on ongoing dysphagia management. Pt is much improved with her alertness and cognitive abilities. She is able to communicate her wants/needs with clarity. Pt states that she likes the puree diet - "I like a soft diet that I don't have to chew" Pt states that her comfort level is greater with puree than food that has "pieces." PT consumed puree and thin liquids via straw without any overt s/s of aspiration or dysphagia. Skilled ST intervention is no longer indicated.    HPI HPI: [name] is a 75 y.o. female with medical history significant for type 2 diabetes mellitus, essential hypertension, acquired hypothyroidism, who is admitted to [facility] on 09/15/2020 with acute hypoxic respiratory failure due to suspected aspiration pneumonia after presenting from SNF to [facility] ED for evaluation of potential aspiration of hotdog. Pt intubated 4/30 with bronchoscopy revealing scrambled eggs and mucus plugs in her airway. Pt extubated on 09/17/20 with consult placed for Bedside Swallow Evaluation. Pt with no history of dysphagia. Chest x-ray on 5/1/202 revealed Persistent though slightly improving bibasilar and retrocardiac opacities. Some slight interval clearing may be related to improving lung volumes.      SLP Plan  All goals met       Recommendations  Diet recommendations: Dysphagia 1 (puree);Thin liquid Liquids provided via: Straw Medication Administration: Crushed with puree Supervision: Patient able to self feed;Intermittent supervision to cue for compensatory strategies Compensations: Minimize environmental distractions;Slow rate;Small sips/bites Postural Changes  and/or Swallow Maneuvers: Seated upright 90 degrees                Oral Care Recommendations: Oral care BID Follow up Recommendations: Skilled Nursing facility SLP Visit Diagnosis: Dysphagia, oropharyngeal phase (R13.12) Plan: All goals met       GO               [name] B. Rutherford Nail M.S., CCC-SLP, [place] Office [phone]  [name] Rutherford Nail 09/21/2020, 10:15 AM

## 2020-09-21 NOTE — Progress Notes (Signed)
NAME:  [name], MRN:  [id number], DOB:  1945-08-01, LOS: 6 ADMISSION DATE:  09/15/2020, CONSULTATION DATE: 09/15/2020 REFERRING MD: Dr. Kerman Passey, CHIEF COMPLAINT: Shortness of breath  History of Present Illness:  75 year old female arrived at [facility] ED via EMS from her nursing facility where she had been in her normal state of health until choking on a hot dog during her last meal.  Per ED documentation and EMS report the patient was able to cough up chunks of hot dog followed by frothy sputum and some difficulty breathing. Per ED documentation EMS stated the patient's SPO2 was 80%, she was placed on nonrebreather and transported to the ED.   ED course: Upon arrival patient's SPO2 was 90% on 15 L.  Once the patient was settled in a stretcher and sat up having calm down oxygen was able to be titrated to 6 L nasal cannula.  Initial vitals: Afebrile at 97.5, RR 14, NSR 95, BP 104/72 with a MAP of 83, SPO2 94% on 6 L nasal cannula. Significant labs: AKI with BUN/Cr: 27/1.28, leukocytosis at 15.5, hyperglycemia at 193, COVID-19 negative, CXR negative for acute disease.  PCCM consulted to evaluate possible bronchoscopy.   09/16/20- patient continued to cough out aspirated foreign body material but continued to remain hypoxemic and required significant supplemental O2.  She is full code and has no family listed per emergency contact. Donnald Garre called her family friend listed on EMR as next of kin [name]) and we discussed current critically ill state with ongoing hypoxemia. We discussed now patient has failure of BIPAP as well and will require ETT and bronchoscopy for FB aspiration.  09/17/20- patient on spontaneous trial.  She did well after bronchoscopy and was able to be weaned down on FiO2, today plan is to liberate from MV and hopefully transfer out to medical floor with TRH. I reviewed plan with Dr Leslye Peer.   09/20/20 - patient remains on medical floor. There is palliative care consultation  due to clinical signs of indicative of poor prognosis long term.  She continues to have physical deconditioning,   09/21/20- patietn is improved. She is lucid and appropirate. States she wants to go back to [facility]. She is on 1L/min [place]. From pulm perspective she can safely be dcd with outpatietn follow up.   Pertinent  Medical History  Hypertension Hypothyroidism Hypercholesterolemia Type 2 diabetes mellitus Breast cancer -right breast 2004 Arthritis MDD Bipolar disorder PTSD Significant Hospital Events: Including procedures, antibiotic start and stop dates in addition to other pertinent events   . 09/15/20- Patient admitted to PCU after " choking event" and acute oxygen need.  Interim History / Subjective:  Patient alert and responsive sitting up in ED stretcher able to self suction with Yankauer device.  Audible secretions at back of throat the patient able to cough them up and self suction appropriately.  Patient denies any previous events with difficulty swallowing.  Patient admits some mild continued shortness of breath, denies need for oxygen use at home & only other associated symptom is a sore throat when attempting to swallow.  Objective   Blood pressure (!) 94/55, pulse 72, temperature 97.6 F (36.4 C), temperature source Oral, resp. rate 16, height 5\' 3"  (1.6 m), weight 78.6 kg, SpO2 91 %.    FiO2 (%):  [0 %] 0 %   Intake/Output Summary (Last 24 hours) at 09/21/2020 1228 Last data filed at 09/21/2020 1019 Gross per 24 hour  Intake 120 ml  Output 800 ml  Net -  680 ml   Filed Weights   09/18/20 0500 09/19/20 0500 09/21/20 0500  Weight: 74.3 kg 76.7 kg 78.6 kg    Examination: General: Adult female, acutely ill, lying in bed, NAD HEENT: MM pink/moist, anicteric, atraumatic, glasses in place neck supple Neuro: A&O x 4, able to follow commands, PERRL +3, MAE CV: s1s2 RRR, NSR-ST on monitor, no r/m/g Pulm: Regular, non labored on 6 L nasal cannula, breath sounds  clear-BUL & diminished-BLL GI: soft, rounded, non tender, bs x 4 Skin: Limited exam- no rashes/lesions noted Extremities: warm/dry, pulses + 2 R/P, no edema noted  Labs/imaging that I have personally reviewed  (right click and "Reselect all SmartList Selections" daily)  Na+/ K+: 135/3.7 BUN/Cr.:  27/1.28 Serum CO2/ AG: 23/14  Hgb: 14.7 WBC/ TMAX: 15.5/afebrile  CXR 09/15/2020: Stable left basilar scarring without acute or active cardiopulmonary disease Resolved Hospital Problem list     Assessment & Plan:  Acute Hypoxic Respiratory Failure secondary to choking event Leukocytosis- 15.5 Suspected Aspiration        -s/p bronchoscopy - aspirated food particles with mucoid debris  -hx of bipolar , aspiration risk is significant with possible dementia development  Acute Kidney Injury in the setting of ACE therapy Baseline Cr:1.0 , Cr on admission:1.28 - Strict I/O's: alert provider if UOP < 0.5 mL/kg/hr - gentle IVF hydration  - Daily BMP, replace electrolytes PRN - Avoid nephrotoxic agents as able, ensure adequate renal perfusion   Best practice (right click and "Reselect all SmartList Selections" daily)  Diet:  NPO Pain/Anxiety/Delirium protocol (if indicated): No VAP protocol (if indicated): Not indicated DVT prophylaxis: SCD * per primary GI prophylaxis: N/A* per primary Glucose control:  SSI Yes Central venous access:  N/A Arterial line:  N/A Foley:  N/A Mobility:  Per primary PT consulted: N/A Last date of multidisciplinary goals of care discussion 09/15/20 Code Status:  full code Disposition: PCU  Labs   CBC: Recent Labs  Lab 09/16/20 0608 09/17/20 0405 09/18/20 0548 09/19/20 0421 09/20/20 0816 09/21/20 0818  WBC 15.2* 15.2* 12.6* 10.7* 8.8 7.9  NEUTROABS 13.2*  --  9.8* 8.0*  --   --   HGB 14.8 12.7 11.7* 11.4* 12.2 12.3  HCT 44.2 36.7 34.5* 33.7* 37.3 36.2  MCV 89.5 88.2 88.5 87.8 89.4 87.7  PLT 246 218 161 163 195 [id number]    Basic Metabolic  Panel: Recent Labs  Lab 09/16/20 0608 09/17/20 0405 09/18/20 0548 09/19/20 0421 09/20/20 0816 09/21/20 0818  NA 136 138 137 137 136 139  K 4.1 3.2* 4.0 3.4* 3.4* 3.2*  CL 98 102 103 102 101 103  CO2 22 26 27 27 25 27   GLUCOSE 268* 169* 136* 127* 102* 89  BUN 27* 26* 16 12 9 8   CREATININE 1.45* 0.96 0.92 0.70 0.66 0.74  CALCIUM 9.5 9.1 8.3* 8.2* 8.7* 8.6*  MG 1.5* 1.7 1.7 1.8  --   --   PHOS 3.4 2.7 2.5 2.7  --   --    GFR: Estimated Creatinine Clearance: 60.3 mL/min (by C-G formula based on SCr of 0.74 mg/dL). Recent Labs  Lab 09/16/20 0608 09/17/20 0405 09/18/20 0548 09/19/20 0421 09/20/20 0816 09/21/20 0818  PROCALCITON 0.84 2.60 1.12  --   --   --   WBC 15.2* 15.2* 12.6* 10.7* 8.8 7.9    Liver Function Tests: Recent Labs  Lab 09/15/20 2151 09/15/20 2259 09/16/20 0608 09/18/20 0548 09/19/20 0421  AST 16 15 18   --   --   ALT  10 10 10   --   --   ALKPHOS 44 44 44  --   --   BILITOT 0.8 0.8 1.3*  --   --   PROT 7.5 7.4 7.2  --   --   ALBUMIN 3.6 3.7 3.3* 2.5* 2.3*   No results for input(s): LIPASE, AMYLASE in the last 168 hours. No results for input(s): AMMONIA in the last 168 hours.  ABG    Component Value Date/Time   PHART 7.36 09/16/2020 1003   PCO2ART 48 09/16/2020 1003   PO2ART 66 (L) 09/16/2020 1003   HCO3 27.1 09/16/2020 1003   O2SAT 91.9 09/16/2020 1003     Coagulation Profile: No results for input(s): INR, PROTIME in the last 168 hours.  Cardiac Enzymes: No results for input(s): CKTOTAL, CKMB, CKMBINDEX, TROPONINI in the last 168 hours.  HbA1C: Hemoglobin A1C  Date/Time Value Ref Range Status  03/26/2012 06:17 AM 6.7 (H) 4.2 - 6.3 % Final    Comment:    The American Diabetes Association recommends that a primary goal of therapy should be <7% and that physicians should reevaluate the treatment regimen in patients with HbA1c values consistently >8%.    Hgb A1c MFr Bld  Date/Time Value Ref Range Status  07/24/2020 01:50 PM 14.2 (H)  4.8 - 5.6 % Final    Comment:    (NOTE) Pre diabetes:          5.7%-6.4%  Diabetes:              >6.4%  Glycemic control for   <7.0% adults with diabetes   01/10/2020 03:12 PM 9.2 (H) 4.8 - 5.6 % Final    Comment:    (NOTE)         Prediabetes: 5.7 - 6.4         Diabetes: >6.4         Glycemic control for adults with diabetes: <7.0     CBG: Recent Labs  Lab 09/20/20 2013 09/21/20 0037 09/21/20 0520 09/21/20 0819 09/21/20 1201  GLUCAP 221* 211* 96 92 188*    Review of Systems: Positives in BOLD  Gen: Denies fever, chills, weight change, fatigue, night sweats HEENT: Denies blurred vision, double vision, hearing loss, tinnitus, sinus congestion, rhinorrhea, sore throat, neck stiffness, dysphagia PULM: Denies shortness of breath, cough, sputum production, hemoptysis, wheezing CV: Denies chest pain, edema, orthopnea, paroxysmal nocturnal dyspnea, palpitations GI: Denies abdominal pain, nausea, vomiting, diarrhea, hematochezia, melena, constipation, change in bowel habits GU: Denies dysuria, hematuria, polyuria, oliguria, urethral discharge Endocrine: Denies hot or cold intolerance, polyuria, polyphagia or appetite change Derm: Denies rash, dry skin, scaling or peeling skin change Heme: Denies easy bruising, bleeding, bleeding gums Neuro: Denies headache, numbness, weakness, slurred speech, loss of memory or consciousness Past Medical History:  She,  has a past medical history of Arthritis, Breast cancer ([place]), Cancer ([place]) (2004), Diabetes mellitus without complication ([place]), Hypercholesterolemia, Hypertension, Hypothyroid, and Seasonal allergies.   Surgical History:   Past Surgical History:  Procedure Laterality Date  . ABDOMINAL HYSTERECTOMY    . BREAST BIOPSY Left    neg  . BREAST EXCISIONAL BIOPSY Right 2004   breast ca and lumpectomy  . BREAST LUMPECTOMY Right 2004   breast ca  . CHOLECYSTECTOMY    . COLONOSCOPY WITH PROPOFOL N/A 02/13/2017   Procedure:  COLONOSCOPY WITH PROPOFOL;  Surgeon: Jonathon Bellows, MD;  [facility];  Service: Gastroenterology;  Laterality: N/A;  . KIDNEY STONE SURGERY       Social  History:   reports that she has quit smoking. Her smoking use included cigarettes. She has never used smokeless tobacco. She reports that she does not drink alcohol and does not use drugs.   Family History:  Her family history is negative for Bladder Cancer and Kidney cancer.   Allergies Allergies  Allergen Reactions  . Navane [Thiothixene] Other (See Comments)    Reaction:  Unknown  . Penicillins Rash    Has patient had a PCN reaction causing immediate rash, facial/tongue/throat swelling, SOB or lightheadedness with hypotension: No Has patient had a PCN reaction causing severe rash involving mucus membranes or skin necrosis: No Has patient had a PCN reaction that required hospitalization: No Has patient had a PCN reaction occurring within the last 10 years: No If all of the above answers are "NO", then may proceed with Cephalosporin use.  Has patient had a PCN reaction causing immediate rash, facial/tongue/throat swelling, SOB or lightheadedness with hypotension: No Has patient had a PCN reaction causing severe rash involving mucus membranes or skin necrosis: No Has patient had a PCN reaction that required hospitalization: No Has patient had a PCN reaction occurring within the last 10 years: No If all of the above answers are "NO", then may proceed with Cephalosporin use. Has patient had a PCN reaction causing immediate rash, facial/tongue/throat swelling, SOB or lightheadedness with hypotension: No Has patient had a PCN reaction causing severe rash involving mucus membranes or skin necrosis: No Has patient had a PCN reaction that required hospitalization: No Has patient had a PCN reaction occurring within the last 10 years: No If all of the above answers are "NO", then may proceed with Cephalosporin use.     Home  Medications  Prior to Admission medications   Medication Sig Start Date End Date Taking? Authorizing Provider  clonazePAM (KLONOPIN) 1 MG tablet Take 1 tablet (1 mg total) by mouth 2 (two) times daily. 08/24/20   Arnetha Courser, MD  divalproex (DEPAKOTE ER) 500 MG 24 hr tablet Take 2 tablets (1,000 mg total) by mouth at bedtime. 01/11/20   Clapacs, Jackquline Denmark, MD  enalapril (VASOTEC) 5 MG tablet Take 1 tablet (5 mg total) by mouth 2 (two) times daily. 01/11/20   Clapacs, Jackquline Denmark, MD  FLUoxetine (PROZAC) 20 MG capsule Take 60 mg by mouth daily.    [provider]  insulin detemir (LEVEMIR) 100 UNIT/ML injection Inject 0.15 mLs (15 Units total) into the skin daily. 07/31/20   Marinda Elk, MD  LATUDA 20 MG TABS tablet Take 20 mg by mouth daily. 08/17/20   [provider]  levothyroxine (SYNTHROID) 88 MCG tablet Take 88 mcg by mouth daily before breakfast.    [provider]  linagliptin (TRADJENTA) 5 MG TABS tablet Take 5 mg by mouth daily.    [provider]  loratadine (CLARITIN) 10 MG tablet Take 1 tablet (10 mg total) by mouth daily. 01/11/20   Clapacs, Jackquline Denmark, MD  metFORMIN (GLUCOPHAGE) 500 MG tablet Take 2 tablets (1,000 mg total) by mouth 2 (two) times daily with a meal. 07/31/20   Marinda Elk, MD  ondansetron (ZOFRAN-ODT) 4 MG disintegrating tablet Take 4 mg by mouth every 6 (six) hours as needed for nausea. 08/16/20   [provider]  rosuvastatin (CRESTOR) 40 MG tablet Take 1 tablet (40 mg total) by mouth daily. 01/11/20   Clapacs, Jackquline Denmark, MD          Vida Rigger, M.D.  Pulmonary & Critical Care Medicine  [place]

## 2020-09-21 NOTE — TOC Progression Note (Signed)
Transition of Care [facility]) - Progression Note    Patient Details  Name: [name] MRN: [id number] Date of Birth: Jan 01, 1946  Transition of Care [facility]) CM/SW [place], RN Phone Number: 09/21/2020, 1:49 PM  Clinical Narrative:    [place] to have the [place] started for insurance Ref number is [id number],    Expected Discharge Plan: Long Term Nursing Home Barriers to Discharge: Continued Medical Work up  Expected Discharge Plan and Services Expected Discharge Plan: [place] In-house Referral: Clinical Social Work   Post Acute Care Choice: Nursing Home ([place]) Living arrangements for the past 2 months: [place]                                       Social Determinants of Health (SDOH) Interventions    Readmission Risk Interventions Readmission Risk Prevention Plan 08/24/2020  PCP or Specialist Appt within 5-7 Days Complete  Medication Review (RN CM) Complete  Some recent data might be hidden

## 2020-09-21 NOTE — NC FL2 (Addendum)
[place] LEVEL OF CARE SCREENING TOOL     IDENTIFICATION  Patient Name: [name] Birthdate: 12-09-1945 Sex: female Admission Date (Current Location): 09/15/2020  [place] and [place] Number:  Engineering geologist and Address:  [facility], [address] [place], [place] [address]      Provider Number: [id number]  Attending Physician Name and Address:  Wyvonnia Dusky, MD  Relative Name and Phone Number:  Serita Butcher [phone]    Current Level of Care: Hospital Recommended Level of Care: [place] Prior Approval Number:    Date Approved/Denied:   PASRR Number: [id number] F Expires: 08/31/2020  Discharge Plan: SNF    Current Diagnoses: Patient Active Problem List   Diagnosis Date Noted  . Acute respiratory failure with hypoxia ([place]) 09/16/2020  . Acute hypoxemic respiratory failure ([place]) 09/16/2020  . Hypomagnesemia   . Aspiration pneumonia ([place]) 09/15/2020  . Generalized weakness 08/22/2020  . UTI (urinary tract infection) 08/22/2020  . Unable to care for self 08/22/2020  . Bipolar 1 disorder ([place]) 08/22/2020  . Weakness 08/22/2020  . AKI (acute kidney injury) ([place]) 08/22/2020  . Bipolar disorder current episode depressed ([place]) 01/07/2020  . MDD (major depressive disorder) 08/10/2019  . MDD (major depressive disorder), recurrent episode, severe ([place]) 06/24/2019  . Chronic kidney disease, stage 3 unspecified ([place]) 05/11/2019  . Pain due to onychomycosis of toenails of both feet 11/23/2018  . Bipolar 1 disorder, manic, moderate ([place]) 07/21/2018  . Bipolar 1 disorder, mixed, severe ([place]) 07/21/2018  . Bipolar 1 disorder with moderate mania ([place]) 07/20/2018  . Severe bipolar I disorder, current or most recent episode depressed ([place]) 07/07/2017  . PTSD (post-traumatic stress disorder) 07/07/2017  . 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 Type 2 diabetes  mellitus with hyperlipidemia ([place]) 09/23/2014  . Essential hypertension 09/21/2014  . Hypothyroidism 09/21/2014  . Dyslipidemia 09/21/2014  . Bipolar I disorder, current or most recent episode manic, severe with mixed features ([place]) 09/20/2014    Orientation RESPIRATION BLADDER Height & Weight     Self,Time,Situation,Place  Normal Incontinent Weight: 78.6 kg Height:  5' 3"  (160 cm)  BEHAVIORAL SYMPTOMS/MOOD NEUROLOGICAL BOWEL NUTRITION STATUS      Incontinent Diet (Puree thin Liquid with a straw, crushed meds in apple sauce)  AMBULATORY STATUS COMMUNICATION OF NEEDS Skin   Extensive Assist Verbally Normal                       Personal Care Assistance Level of Assistance  Bathing,Dressing Bathing Assistance: Limited assistance   Dressing Assistance: Limited assistance     Functional Limitations Info             SPECIAL CARE FACTORS FREQUENCY  PT (By licensed PT)     PT Frequency: 5 times per week              Contractures      Additional Factors Info  Code Status,Allergies Code Status Info: full code Allergies Info: Navane Thiothixene, Penicillins           Current Medications (09/21/2020):  This is the current hospital active medication list Current Facility-Administered Medications  Medication Dose Route Frequency Provider Last Rate Last Admin  . 0.9 %  sodium chloride infusion  250 mL Intravenous Continuous Rust-Chester, Huel Cote, NP   Stopped at 09/20/20 0523  . albuterol (PROVENTIL) (2.5 MG/3ML) 0.083% nebulizer solution 2.5 mg  2.5 mg Nebulization BID Eppie Gibson  M, MD   2.5 mg at 09/21/20 0750  . chlorhexidine gluconate (MEDLINE KIT) (PERIDEX) 0.12 % solution 15 mL  15 mL Mouth Rinse BID Ottie Glazier, MD   15 mL at 09/21/20 1005  . Chlorhexidine Gluconate Cloth 2 % PADS 6 each  6 each Topical Q2200 Ottie Glazier, MD   6 each at 09/20/20 2230  . divalproex (DEPAKOTE SPRINKLE) capsule 500 mg  500 mg Oral Q12H Dallie Piles, RPH   500  mg at 09/21/20 1003  . enoxaparin (LOVENOX) injection 40 mg  40 mg Subcutaneous Q24H Loletha Grayer, MD   40 mg at 09/20/20 2229  . FLUoxetine (PROZAC) capsule 60 mg  60 mg Oral Daily Loletha Grayer, MD   60 mg at 09/21/20 1002  . haloperidol lactate (HALDOL) injection 1 mg  1 mg Intravenous Q6H PRN Clapacs, John T, MD      . insulin aspart (novoLOG) injection 0-15 Units  0-15 Units Subcutaneous Q4H Rust-Chester, Britton L, NP   5 Units at 09/21/20 0153  . insulin glargine (LANTUS) injection 6 Units  6 Units Subcutaneous QHS Loletha Grayer, MD   6 Units at 09/20/20 2229  . levothyroxine (SYNTHROID) tablet 88 mcg  88 mcg Oral QAC breakfast Rust-Chester, Britton L, NP   88 mcg at 09/21/20 0550  . lurasidone (LATUDA) tablet 20 mg  20 mg Oral Daily Rust-Chester, Britton L, NP   20 mg at 09/21/20 1004  . metroNIDAZOLE (FLAGYL) IVPB 500 mg  500 mg Intravenous Q8H Wieting, Richard, MD 100 mL/hr at 09/21/20 0500 500 mg at 09/21/20 0500  . pantoprazole (PROTONIX) EC tablet 40 mg  40 mg Oral Q24H Loletha Grayer, MD   40 mg at 09/20/20 1839  . thiamine (B-1) injection 100 mg  100 mg Intravenous Daily Bhagat, Srishti L, MD   100 mg at 09/21/20 1002  . vitamin B-12 (CYANOCOBALAMIN) tablet 1,000 mcg  1,000 mcg Oral Daily Bhagat, Srishti L, MD   1,000 mcg at 09/21/20 1002     Discharge Medications: Please see discharge summary for a list of discharge medications.  Relevant Imaging Results:  Relevant Lab Results:   Additional Information SS#: [id number]. Fully vaccinated. Unsure of first 2 vaccination dates but got Moderna booster on 07/27/20. last at  at [facility] she may need to transition to LTC due to frequent falls at home.  Su Hilt, RN

## 2020-09-21 NOTE — Progress Notes (Signed)
Daily Progress Note   Patient Name: [name]       Date: 09/21/2020 DOB: 1945/11/16  Age: 75 y.o. MRN#: [id number] Attending Physician: Wyvonnia Dusky, MD Primary Care Physician: Mechele Claude, FNP Admit Date: 09/15/2020  Reason for Consultation/Follow-up: Establishing goals of care  Subjective: Chart Reviewed. Updates Received. Patient Assessed.   Awake and alert watching television.  Denies pain or shortness of breath.  States she feels much better on today.  Patient asking if I had spoken with her son.  Patient remembered goals of care discussion from yesterday.  We reviewed her expressed wishes and goals.  Patient continues to states she would not want life prolonging measures such as CPR or intubation however she does not wish to change her status with hopes her son will communicate with her and allow for further conversations.  Of note we did call patient's son, [name] while at the bedside.  Wife answered the phone expressing son's disinterest in further conversations stating patient is able to make decisions.  Son would not speak with me over the phone.  Patient expressed she has not had a good relationship with her children in years.  She shares they mean the world to her however they have the impression there she is not the best mom and have distanced themselves.  She requests if son does call him back to tell him that she loves him and she would love to hear his voice and have a casual conversation.  She states she does not feel she will have a relationship with her daughter again but does feel like her son would be there if she really needed him.  She continues to identify her son [name] as her surrogate medical decision maker in the event she is unable to make decisions for herself.  Patient is appreciative of her health improvement and is hopeful she may return back to SNF with rehabilitation.  She is open to outpatient palliative support.  All questions  answered and support provided.  Length of Stay: 6 days  Vital Signs: BP (!) 94/55 (BP Location: Left Arm)   Pulse 72   Temp 97.6 F (36.4 C) (Oral)   Resp 16   Ht 5\' 3"  (1.6 m)   Wt 78.6 kg   SpO2 91%   BMI 30.70 kg/m  SpO2: SpO2: 91 % O2 Device: O2 Device: Room Air O2 Flow Rate: O2 Flow Rate (L/min): 0 L/min  Physical Exam: AAOx3 RRR Clear bilaterally Mood appropriate, follows commands          Palliative Care Assessment & Plan  HPI: Palliative Care consult requested for goals of care discussion in this 75 y.o. female with a medical history significant for type 2 diabetes mellitus, hypertension, acquired hypothyroidism, right breast cancer (2014), and bipolar disorder. She presented to the ED from [facility] facility with concerns for potential aspiration after eating a hotdog. Since admission underwent bronchoscopy with aspirate clearance. She is being followed by SLP and Psychiatry.   Code Status: Full code  Goals of Care/Recommendations: Patient request to remain a full code with hopes she would be able to have a discussion with her son.  She states she would not want any life prolonging measures such as artificial feedings/PEG, CPR, intubation however does not want her CODE STATUS changed at this time. Continue to treat the treatable patient remains hopeful for improvement.  She is clear and expressed goals to return to rehabilitation and allow every opportunity to continue to  thrive. Attempted to call patient's son, [name] at the bedside.  Wife answered the phone expressing patient is not interested in discussions/updates.  Wife expressed patient should be able to make her own decisions.  Son can be heard in the background but would not speak over the phone.  Acknowledged his request and he has my contact information if he wishes to further discuss. Patient is requesting outpatient palliative support at discharge.  (TOC referral) PMT will continue to support and follow  as needed.  Please call for urgent needs.  Prognosis: Unable to determine  Discharge Planning: [place] for rehab with Palliative care service follow-up  Thank you for allowing the Palliative Medicine Team to assist in the care of this patient.  Time Total:45 min.   Visit consisted of counseling and education dealing with the complex and emotionally intense issues of symptom management and palliative care in the setting of serious and potentially life-threatening illness.Greater than 50%  of this time was spent counseling and coordinating care related to the above assessment and plan.  Alda Lea, AGPCNP-BC  Palliative Medicine Team [phone]

## 2020-09-21 NOTE — Progress Notes (Signed)
PROGRESS NOTE    [name]  GTX:[id number] DOB: [birth year] DOA: 09/15/2020 PCP: Mechele Claude, FNP   Assessment & Plan:   Principal Problem:   Bipolar 1 disorder with moderate mania ([place]) Active Problems:   Essential hypertension   Hypothyroidism   Dyslipidemia   Type 2 diabetes mellitus with hyperlipidemia ([place])   AKI (acute kidney injury) ([place])   Aspiration pneumonia ([place])   Acute respiratory failure with hypoxia ([place])   Acute hypoxemic respiratory failure ([place])   Acute hypoxic respiratory failure: likely secondary to aspiration. S/p intubation & bronchoscopy. Extubated on 09/17/20. Continue on IV flagyl. Completed rocephin course. Weaned off of supplemental oxygen   Likely aspiration pneumonia: continue on IV flagyl, rocephin, bronchodilators and encourage incentive spirometry   Hypokalemia: potassium given. Will continue to monitor   Leukocytosis: resolved   Hypothyroidism: continue on home dose of levothyroxine   DM2: likely poorly controlled. Continue on lantus, SSI w/ accuchecks   Bipolar disorder: continue on home dose of latuda, fluoxetine, depakote. Haldol prn   Weakness: PT/OT recs SNF    DVT prophylaxis: lovenox  Code Status: full  Family Communication:  Disposition Plan: likely d/c to SNF  Level of care: Med-Surg   Status is: Inpatient  Remains inpatient appropriate because:Unsafe d/c plan and Inpatient level of care appropriate due to severity of illness   Dispo: The patient is from: Home              Anticipated d/c is to: SNF              Patient currently is medically stable to d/c.   Difficult to place patient No     Consultants:   Psych    Procedures:    Antimicrobials: flagyl    Subjective: Pt c/o fatigue   Objective: Vitals:   09/20/20 2013 09/21/20 0035 09/21/20 0500 09/21/20 0521  BP: 113/71 123/75  136/68  Pulse: 76 (!) 58  (!) 50  Resp: _0 Temp: 98.3 F (36.8 C) (!) 97.5 F (36.4 C)  97.8 F  (36.6 C)  TempSrc: Oral Oral  Oral  SpO2: 94% 93%  97%  Weight:   78.6 kg   Height:        Intake/Output Summary (Last 24 hours) at 09/21/2020 0737 Last data filed at 09/21/2020 0524 Gross per 24 hour  Intake 0 ml  Output 800 ml  Net -800 ml   Filed Weights   09/18/20 0500 09/19/20 0500 09/21/20 0500  Weight: 74.3 kg 76.7 kg 78.6 kg    Examination:  General exam: Appears comfortable  Respiratory system: clear breath sounds b/l  Cardiovascular system: S1/S2+. No rubs or clicks  Gastrointestinal system: Abd is soft, NT, ND & hypoactive bowel sounds  Central nervous system: Alert and oriented. Moves all extremities  Psychiatry: Judgement and insight appear normal. Flat mood and affect     Data Reviewed: I have personally reviewed following labs and imaging studies  CBC: Recent Labs  Lab 09/16/20 0608 09/17/20 0405 09/18/20 0548 09/19/20 0421 09/20/20 0816  WBC 15.2* 15.2* 12.6* 10.7* 8.8  NEUTROABS 13.2*  --  9.8* 8.0*  --   HGB 14.8 12.7 11.7* 11.4* 12.2  HCT 44.2 36.7 34.5* 33.7* 37.3  MCV 89.5 88.2 88.5 87.8 89.4  PLT 246 218 161 163 248   Basic Metabolic Panel: Recent Labs  Lab 09/16/20 0608 09/17/20 0405 09/18/20 0548 09/19/20 0421 09/20/20 0816  NA 136 138 137 137 136  K 4.1  3.2* 4.0 3.4* 3.4*  CL 98 102 103 102 101  CO2 _0 GLUCOSE 268* 169* 136* 127* 102*  BUN 27* 26* _1 CREATININE 1.45* 0.96 0.92 0.70 0.66  CALCIUM 9.5 9.1 8.3* 8.2* 8.7*  MG 1.5* 1.7 1.7 1.8  --   PHOS 3.4 2.7 2.5 2.7  --    GFR: Estimated Creatinine Clearance: 60.3 mL/min (by C-G formula based on SCr of 0.66 mg/dL). Liver Function Tests: Recent Labs  Lab 09/15/20 2151 09/15/20 2259 09/16/20 0608 09/18/20 0548 09/19/20 0421  AST _2 --   --   ALT _3 --   --   ALKPHOS 44 44 44  --   --   BILITOT 0.8 0.8 1.3*  --   --   PROT 7.5 7.4 7.2  --   --   ALBUMIN 3.6 3.7 3.3* 2.5* 2.3*   No results for input(s): LIPASE, AMYLASE in the last 168  hours. No results for input(s): AMMONIA in the last 168 hours. Coagulation Profile: No results for input(s): INR, PROTIME in the last 168 hours. Cardiac Enzymes: No results for input(s): CKTOTAL, CKMB, CKMBINDEX, TROPONINI in the last 168 hours. BNP (last 3 results) No results for input(s): PROBNP in the last 8760 hours. HbA1C: No results for input(s): HGBA1C in the last 72 hours. CBG: Recent Labs  Lab 09/20/20 1204 09/20/20 1631 09/20/20 2013 09/21/20 0037 09/21/20 0520  GLUCAP 155* 127* 221* 211* 96   Lipid Profile: No results for input(s): CHOL, HDL, LDLCALC, TRIG, CHOLHDL, LDLDIRECT in the last 72 hours. Thyroid Function Tests: No results for input(s): TSH, T4TOTAL, FREET4, T3FREE, THYROIDAB in the last 72 hours. Anemia Panel: Recent Labs    09/18/20 1515  VITAMINB12 295   Sepsis Labs: Recent Labs  Lab 09/16/20 0608 09/17/20 0405 09/18/20 0548  PROCALCITON 0.84 2.60 1.12    Recent Results (from the past 240 hour(s))  Resp Panel by RT-PCR (Flu A&B, Covid) Nasopharyngeal Swab     Status: None   Collection Time: 09/15/20  8:52 PM   Specimen: Nasopharyngeal Swab; Nasopharyngeal(NP) swabs in vial transport medium  Result Value Ref Range Status   SARS Coronavirus 2 by RT PCR NEGATIVE NEGATIVE Final    Comment: (NOTE) SARS-CoV-2 target nucleic acids are NOT DETECTED.  The SARS-CoV-2 RNA is generally detectable in upper respiratory specimens during the acute phase of infection. The lowest concentration of SARS-CoV-2 viral copies this assay can detect is 138 copies/mL. A negative result does not preclude SARS-Cov-2 infection and should not be used as the sole basis for treatment or other patient management decisions. A negative result may occur with  improper specimen collection/handling, submission of specimen other than nasopharyngeal swab, presence of viral mutation(s) within the areas targeted by this assay, and inadequate number of viral copies(<138  copies/mL). A negative result must be combined with clinical observations, patient history, and epidemiological information. The expected result is Negative.  Fact Sheet for Patients:  EntrepreneurPulse.com.au  Fact Sheet for Healthcare Providers:  IncredibleEmployment.be  This test is no t yet approved or cleared by the [place] FDA and  has been authorized for detection and/or diagnosis of SARS-CoV-2 by FDA under an Emergency Use Authorization (EUA). This EUA will remain  in effect (meaning this test can be used) for the duration of the COVID-19 declaration under Section 564(b)(1) of the Act, 21 U.S.C.section 360bbb-3(b)(1), unless the authorization is terminated  or revoked sooner.  Influenza A by PCR NEGATIVE NEGATIVE Final   Influenza B by PCR NEGATIVE NEGATIVE Final    Comment: (NOTE) The Xpert Xpress SARS-CoV-2/FLU/RSV plus assay is intended as an aid in the diagnosis of influenza from Nasopharyngeal swab specimens and should not be used as a sole basis for treatment. Nasal washings and aspirates are unacceptable for Xpert Xpress SARS-CoV-2/FLU/RSV testing.  Fact Sheet for Patients: EntrepreneurPulse.com.au  Fact Sheet for Healthcare Providers: IncredibleEmployment.be  This test is not yet approved or cleared by the [place] FDA and has been authorized for detection and/or diagnosis of SARS-CoV-2 by FDA under an Emergency Use Authorization (EUA). This EUA will remain in effect (meaning this test can be used) for the duration of the COVID-19 declaration under Section 564(b)(1) of the Act, 21 U.S.C. section 360bbb-3(b)(1), unless the authorization is terminated or revoked.  Performed at [facility], [address] [place], [place] [address]   Culture, blood (Routine X 2) w Reflex to ID Panel     Status: None   Collection Time: 09/16/20  6:08 AM   Specimen: BLOOD   Result Value Ref Range Status   Specimen Description BLOOD RIGHT ANTECUBITAL  Final   Special Requests   Final    BOTTLES DRAWN AEROBIC AND ANAEROBIC Blood Culture adequate volume   Culture   Final    NO GROWTH 5 DAYS Performed at [facility], [address] [place], [place] [address]    Report Status 09/21/2020 FINAL  Final  Culture, blood (Routine X 2) w Reflex to ID Panel     Status: None   Collection Time: 09/16/20  7:24 AM   Specimen: BLOOD  Result Value Ref Range Status   Specimen Description BLOOD BLOOD LEFT HAND  Final   Special Requests   Final    BOTTLES DRAWN AEROBIC AND ANAEROBIC Blood Culture adequate volume   Culture   Final    NO GROWTH 5 DAYS Performed at [facility], [address], [place], [place] [address]    Report Status 09/21/2020 FINAL  Final  Culture, BAL-quantitative w Gram Stain     Status: Abnormal   Collection Time: 09/16/20 10:31 AM   Specimen: Bronchoalveolar Lavage; Respiratory  Result Value Ref Range Status   Specimen Description   Final    BRONCHIAL ALVEOLAR LAVAGE Performed at [facility], [address] [place], [place] [address]    Special Requests   Final    NONE Performed at [facility] [place], [place] [address]    Gram Stain   Final    MODERATE WBC PRESENT,BOTH PMN AND MONONUCLEAR NO ORGANISMS SEEN    Culture (A)  Final    10,000 COLONIES/mL Normal respiratory flora-no Staph aureus or Pseudomonas seen Performed at [facility] [address], [place], [place] [address]    Report Status 09/18/2020 FINAL  Final  Culture, BAL-quantitative w Gram Stain     Status: Abnormal   Collection Time: 09/16/20 10:31 AM   Specimen: Bronchial Wash; Respiratory  Result Value Ref Range Status   Specimen Description   Final    BRONCHIAL WASHINGS Performed at [facility], [address] [place], [place] [address]    Special Requests   Final    TRACHEAL  ASPIRATE Performed at [facility], [address] [place],  [address]    Gram Stain   Final    ABUNDANT WBC PRESENT, PREDOMINANTLY PMN RARE GRAM POSITIVE COCCI  Culture (A)  Final    20,000 COLONIES/mL Normal respiratory flora-no Staph aureus or Pseudomonas seen Performed at [facility] [address], [place], [place] [address]    Report Status 09/18/2020 FINAL  Final  MRSA PCR Screening     Status: None   Collection Time: 09/16/20  1:30 PM   Specimen: Nasopharyngeal  Result Value Ref Range Status   MRSA by PCR NEGATIVE NEGATIVE Final    Comment:        The GeneXpert MRSA Assay (FDA approved for NASAL specimens only), is one component of a comprehensive MRSA colonization surveillance program. It is not intended to diagnose MRSA infection nor to guide or monitor treatment for MRSA infections. Performed at [facility], [address], [place], [place] [address]          Radiology Studies: [facility] Chest Fay 1 View  Result Date: 09/20/2020 CLINICAL DATA:  Aspiration pneumonia. EXAM: PORTABLE CHEST 1 VIEW COMPARISON:  09/17/2020. FINDINGS: Interim extubation and removal of NG tube. Heart size stable. Persistent bibasilar atelectasis/infiltrates. Right perihilar atelectasis/infiltrate noted on today's exam. No pleural effusion or pneumothorax. IMPRESSION: 1.  Interim extubation and removal of NG tube. 2. Persistent bibasilar atelectasis/infiltrates. Right perihilar atelectasis/infiltrate noted on today's exam. Electronically Signed   By: Marcello Moores  Register   On: 09/20/2020 05:59        Scheduled Meds: . albuterol  2.5 mg Nebulization BID  . chlorhexidine gluconate (MEDLINE KIT)  15 mL Mouth Rinse BID  . Chlorhexidine Gluconate Cloth  6 each Topical Q2200  . divalproex  500 mg Oral Q12H  . enoxaparin (LOVENOX) injection  40 mg Subcutaneous Q24H  . FLUoxetine  60 mg Oral Daily  . insulin aspart  0-15 Units Subcutaneous Q4H  . insulin  glargine  6 Units Subcutaneous QHS  . levothyroxine  88 mcg Oral QAC breakfast  . lurasidone  20 mg Oral Daily  . pantoprazole  40 mg Oral Q24H  . thiamine injection  100 mg Intravenous Daily  . vitamin B-12  1,000 mcg Oral Daily   Continuous Infusions: . sodium chloride Stopped (09/20/20 0523)  . metronidazole 500 mg (09/21/20 0500)     LOS: 6 days    Time spent: 30 mins    Wyvonnia Dusky, MD [facility] Pager [phone]  If 7PM-7AM, please contact night-coverage 09/21/2020, 7:37 AM

## 2020-09-21 NOTE — Progress Notes (Signed)
Physical Therapy Treatment Patient Details Name: [name] MRN: [id number] DOB: 1945/08/09 Today's Date: 09/21/2020    History of Present Illness Pt is a 75 y.o. female with medical history significant for type 2 diabetes mellitus, essential hypertension, acquired hypothyroidism, who is admitted to [facility] on 09/15/2020 with acute hypoxic respiratory failure.  MD assessment includes: Acute hypoxic respiratory failure secondary to aspiration with food removed from the lungs on vent on extubated, hypothyroidism, depression on numerous psychiatric medications, hypomagnesemia, and weakness.    PT Comments    Pt was pleasant and motivated to participate during the session and overall made good progress towards goals compared to prior session.  Pt found on room air with SpO2 83%, placed back on [place] with SpO2 returning quickly to >/=90%, nursing notified.  Pt continued to require extensive assist with functional tasks but decreased assist compared to prior session.  Pt was unsteady in sitting and standing but did not require constant assist to prevent LOB and responded well to anterior weight shifting activities.  Pt was able to take several small, shuffling steps at the EOB with cues for anterior weight shifting and proper use of the RW.  Pt will benefit from PT services in a SNF setting upon discharge to safely address deficits listed in patient problem list for decreased caregiver assistance and eventual return to PLOF.      Follow Up Recommendations  SNF;Supervision/Assistance - 24 hour     Equipment Recommendations  Other (comment) (TBD)    Recommendations for Other Services       Precautions / Restrictions Precautions Precautions: Fall Restrictions Weight Bearing Restrictions: No    Mobility  Bed Mobility Overal bed mobility: Needs Assistance Bed Mobility: Supine to Sit;Sit to Supine     Supine to sit: +2 for physical assistance;Max assist Sit to  supine: +2 for physical assistance;Max assist   General bed mobility comments: Improved effort from patient but continued to require extensive physical assist    Transfers Overall transfer level: Needs assistance Equipment used: Rolling walker (2 wheeled) Transfers: Sit to/from Stand Sit to Stand: Mod assist;+2 physical assistance         General transfer comment: Max verbal and tactile cues for BLE foot placement and increased trunk flexion  Ambulation/Gait Ambulation/Gait assistance: Mod assist Gait Distance (Feet): 1 Feet Assistive device: Rolling walker (2 wheeled) Gait Pattern/deviations: Step-to pattern;Shuffle Gait velocity: decreased   General Gait Details: Max verbal and tactile cues for anterior weight shifting to prevent posterior instability; mod A to prevent posterior LOB; pt only able to take several small, shuffling steps   Stairs             Wheelchair Mobility    Modified Rankin (Stroke Patients Only)       Balance Overall balance assessment: Needs assistance   Sitting balance-Leahy Scale: Poor Sitting balance - Comments: Improved from prior session but pt continued to require occasional assist to prevent L lateral and posterior LOB Postural control: Posterior lean;Right lateral lean Standing balance support: Bilateral upper extremity supported;During functional activity Standing balance-Leahy Scale: Poor                              Cognition Arousal/Alertness: Awake/alert Behavior During Therapy: WFL for tasks assessed/performed Overall Cognitive Status: No family/caregiver present to determine baseline cognitive functioning  Exercises Total Joint Exercises Ankle Circles/Pumps: AROM;Strengthening;Both;5 reps;10 reps Quad Sets: Strengthening;Both;5 reps;10 reps Hip ABduction/ADduction: AAROM;Both;10 reps Other Exercises Other Exercises: Anterior weight shifting  activities in sitting and standing to address posterior lean    General Comments        Pertinent Vitals/Pain Pain Assessment: 0-10 Pain Score: 2  Pain Location: L knee Pain Descriptors / Indicators: Sore Pain Intervention(s): Premedicated before session;Monitored during session    Home Living                      Prior Function            PT Goals (current goals can now be found in the care plan section) Progress towards PT goals: Progressing toward goals    Frequency    Min 2X/week      PT Plan Current plan remains appropriate    Co-evaluation              AM-PAC PT "6 Clicks" Mobility   Outcome Measure  Help needed turning from your back to your side while in a flat bed without using bedrails?: Total Help needed moving from lying on your back to sitting on the side of a flat bed without using bedrails?: Total Help needed moving to and from a bed to a chair (including a wheelchair)?: Total Help needed standing up from a chair using your arms (e.g., wheelchair or bedside chair)?: A Lot Help needed to walk in hospital room?: Total Help needed climbing 3-5 steps with a railing? : Total 6 Click Score: 7    End of Session Equipment Utilized During Treatment: Oxygen;Gait belt Activity Tolerance: Patient tolerated treatment well Patient left: in bed;with call bell/phone within reach;with SCD's reapplied;with nursing/sitter in room Nurse Communication: Mobility status;Other (comment) (SpO2 at 83% on room air, placed back on [place], nursing notified) PT Visit Diagnosis: Difficulty in walking, not elsewhere classified (R26.2);Muscle weakness (generalized) (M62.81)     Time: 3785-8850 PT Time Calculation (min) (ACUTE ONLY): 27 min  Charges:  $Therapeutic Exercise: 8-22 mins $Therapeutic Activity: 8-22 mins                     D. Scott Dyami Umbach PT, DPT 09/21/20, 12:05 PM

## 2020-09-21 NOTE — TOC Progression Note (Addendum)
Transition of Care [facility]) - Progression Note    Patient Details  Name: Sanaz Scarlett MRN: [id number] Date of Birth: 1946/03/02  [facility] CM/SW [place], RN Phone Number: 09/21/2020, 10:01 AM  Clinical Narrative:  Met with the patient to discuss DC plan and needs, She has been at [facility] for short term rehab and plans to return to continue rehab at DC, she is alert and oriented, She stated at home she uses a walker or a rollator and has both, she has transportation thru her insurance company, She lives at [employer] alone when not in rehab, [employer] completed, and insurance auth will be  started once updated PT notes obtained    Expected Discharge Plan: [place] Barriers to Discharge: Continued Medical Work up  Expected Discharge Plan and Services Expected Discharge Plan: [place] In-house Referral: Clinical Social Work   Post Acute Care Choice: Nursing Home ([place]) Living arrangements for the past 2 months: [place]                                       Social Determinants of Health (SDOH) Interventions    Readmission Risk Interventions Readmission Risk Prevention Plan 08/24/2020  PCP or Specialist Appt within 5-7 Days Complete  Medication Review (RN CM) Complete  Some recent data might be hidden

## 2020-09-22 DIAGNOSIS — R531 Weakness: Secondary | ICD-10-CM | POA: Diagnosis not present

## 2020-09-22 DIAGNOSIS — E876 Hypokalemia: Secondary | ICD-10-CM

## 2020-09-22 LAB — CBC
HCT: 35.3 % — ABNORMAL LOW (ref 36.0–46.0)
Hemoglobin: 11.8 g/dL — ABNORMAL LOW (ref 12.0–15.0)
MCH: 29.4 pg (ref 26.0–34.0)
MCHC: 33.4 g/dL (ref 30.0–36.0)
MCV: 87.8 fL (ref 80.0–100.0)
Platelets: 219 10*3/uL (ref 150–400)
RBC: 4.02 MIL/uL (ref 3.87–5.11)
RDW: 14.2 % (ref 11.5–15.5)
WBC: 7.5 10*3/uL (ref 4.0–10.5)
nRBC: 0 % (ref 0.0–0.2)

## 2020-09-22 LAB — RESP PANEL BY RT-PCR (FLU A&B, COVID) ARPGX2
Influenza A by PCR: NEGATIVE
Influenza B by PCR: NEGATIVE
SARS Coronavirus 2 by RT PCR: NEGATIVE

## 2020-09-22 LAB — BASIC METABOLIC PANEL
Anion gap: 8 (ref 5–15)
BUN: 8 mg/dL (ref 8–23)
CO2: 26 mmol/L (ref 22–32)
Calcium: 8.3 mg/dL — ABNORMAL LOW (ref 8.9–10.3)
Chloride: 103 mmol/L (ref 98–111)
Creatinine, Ser: 0.76 mg/dL (ref 0.44–1.00)
GFR, Estimated: >60 mL/min (ref >60)
Glucose, Bld: 127 mg/dL — ABNORMAL HIGH (ref 70–99)
Potassium: 3.2 mmol/L — ABNORMAL LOW (ref 3.5–5.1)
Sodium: 137 mmol/L (ref 135–145)

## 2020-09-22 LAB — GLUCOSE, CAPILLARY
Glucose-Capillary: 110 mg/dL — ABNORMAL HIGH (ref 70–99)
Glucose-Capillary: 133 mg/dL — ABNORMAL HIGH (ref 70–99)
Glucose-Capillary: 137 mg/dL — ABNORMAL HIGH (ref 70–99)
Glucose-Capillary: 174 mg/dL — ABNORMAL HIGH (ref 70–99)
Glucose-Capillary: 191 mg/dL — ABNORMAL HIGH (ref 70–99)
Glucose-Capillary: 263 mg/dL — ABNORMAL HIGH (ref 70–99)

## 2020-09-22 MED ORDER — ALBUTEROL SULFATE (2.5 MG/3ML) 0.083% IN NEBU
2.5000 mg | INHALATION_SOLUTION | Freq: Four times a day (QID) | RESPIRATORY_TRACT | Status: DC | PRN
  Administered 2020-09-25: 2.5 mg via RESPIRATORY_TRACT
  Filled 2020-09-22: qty 3

## 2020-09-22 MED ORDER — THIAMINE HCL 100 MG PO TABS
100.0000 mg | ORAL_TABLET | Freq: Every day | ORAL | Status: DC
  Administered 2020-09-23 – 2020-10-02 (×10): 100 mg via ORAL
  Filled 2020-09-22 (×10): qty 1

## 2020-09-22 MED ORDER — ACETAMINOPHEN 325 MG PO TABS
650.0000 mg | ORAL_TABLET | Freq: Four times a day (QID) | ORAL | Status: DC | PRN
  Administered 2020-09-23: 650 mg via ORAL
  Filled 2020-09-22: qty 2

## 2020-09-22 MED ORDER — POTASSIUM CHLORIDE CRYS ER 20 MEQ PO TBCR
20.0000 meq | EXTENDED_RELEASE_TABLET | Freq: Once | ORAL | Status: AC
  Administered 2020-09-22: 20 meq via ORAL
  Filled 2020-09-22: qty 1

## 2020-09-22 NOTE — Clinical Social Work Note (Signed)
RE:  [name]   Date of Birth:   [birth year]___________  Date: 09/22/2020       To Whom It May Concern:  Please be advised that the above-named patient will require a short-term nursing home stay - anticipated 30 days or less for rehabilitation and strengthening.  The plan is for return home.    MD signed electronically  (see below)            MD signature   ___ 09/22/2020         _________ Date

## 2020-09-22 NOTE — Care Management Important Message (Signed)
Important Message  Patient Details  Name: [name] MRN: [id number] Date of Birth: [birth year]   Medicare Important Message Given:  Yes  Reviewed the Important Message from Medicare with patient. Patient  stated she would like to read over it again before she signed the copy. Left a copy with the patient to review at her leisure and thanked her for her time.     [place] 09/22/2020, 11:49 AM

## 2020-09-22 NOTE — Progress Notes (Signed)
Patient denies need for insulin r/t FSBS 174. This nurse educates on importance of following medication regimen. Patient voices understanding and states her "sugar is too low for insulin" .Will continue to monitor to ensure comfort and safety.

## 2020-09-22 NOTE — TOC Progression Note (Addendum)
Transition of Care [facility]) - Progression Note    Patient Details  Name: [name] MRN: [id number] Date of Birth: 1945-07-03  Transition of Care [facility] CM/SW Contact  Eileen Stanford, LCSW Phone Number: 09/22/2020, 2:46 PM  Clinical Narrative:   Spoke with Everlene Balls and Josem Kaufmann is still pending. Clinicals submitted for PASRR.  3:40- [employer] denied auth, appeal info provided to MD.   Expected Discharge Plan: Long Term Nursing Home Barriers to Discharge: Continued Medical Work up  Expected Discharge Plan and Services Expected Discharge Plan: [place] In-house Referral: Clinical Social Work   Post Acute Care Choice: Nursing Home ([facility]) Living arrangements for the past 2 months: [place]                                       Social Determinants of Health (SDOH) Interventions    Readmission Risk Interventions Readmission Risk Prevention Plan 08/24/2020  PCP or Specialist Appt within 5-7 Days Complete  Medication Review (RN CM) Complete  Some recent data might be hidden

## 2020-09-22 NOTE — Progress Notes (Signed)
NAME:  [name], MRN:  [id number], DOB:  07/17/1945, LOS: 7 ADMISSION DATE:  09/15/2020, CONSULTATION DATE: 09/15/2020 REFERRING MD: Dr. Kerman Passey, CHIEF COMPLAINT: Shortness of breath  History of Present Illness:  75 year old female arrived at [facility] ED via EMS from her nursing facility where she had been in her normal state of health until choking on a hot dog during her last meal.  Per ED documentation and EMS report the patient was able to cough up chunks of hot dog followed by frothy sputum and some difficulty breathing. Per ED documentation EMS stated the patient's SPO2 was 80%, she was placed on nonrebreather and transported to the ED.   ED course: Upon arrival patient's SPO2 was 90% on 15 L.  Once the patient was settled in a stretcher and sat up having calm down oxygen was able to be titrated to 6 L nasal cannula.  Initial vitals: Afebrile at 97.5, RR 14, NSR 95, BP 104/72 with a MAP of 83, SPO2 94% on 6 L nasal cannula. Significant labs: AKI with BUN/Cr: 27/1.28, leukocytosis at 15.5, hyperglycemia at 193, COVID-19 negative, CXR negative for acute disease.  PCCM consulted to evaluate possible bronchoscopy.   09/16/20- patient continued to cough out aspirated foreign body material but continued to remain hypoxemic and required significant supplemental O2.  She is full code and has no family listed per emergency contact. Donnald Garre called her family friend listed on EMR as next of kin [name]) and we discussed current critically ill state with ongoing hypoxemia. We discussed now patient has failure of BIPAP as well and will require ETT and bronchoscopy for FB aspiration.  09/17/20- patient on spontaneous trial.  She did well after bronchoscopy and was able to be weaned down on FiO2, today plan is to liberate from MV and hopefully transfer out to medical floor with TRH. I reviewed plan with Dr Leslye Peer.   09/20/20 - patient remains on medical floor. There is palliative care consultation  due to clinical signs of indicative of poor prognosis long term.  She continues to have physical deconditioning,   09/22/20- patietn is improved. She is lucid and appropirate. States she wants to go back to [facility]. She is on room air now. From pulm perspective she can safely be dcd with outpatietn follow up.   Pertinent  Medical History  Hypertension Hypothyroidism Hypercholesterolemia Type 2 diabetes mellitus Breast cancer -right breast 2004 Arthritis MDD Bipolar disorder PTSD Significant Hospital Events: Including procedures, antibiotic start and stop dates in addition to other pertinent events   . 09/15/20- Patient admitted to PCU after " choking event" and acute oxygen need.  Interim History / Subjective:  Patient alert and responsive sitting up in ED stretcher able to self suction with Yankauer device.  Audible secretions at back of throat the patient able to cough them up and self suction appropriately.  Patient denies any previous events with difficulty swallowing.  Patient admits some mild continued shortness of breath, denies need for oxygen use at home & only other associated symptom is a sore throat when attempting to swallow.  Objective   Blood pressure 114/66, pulse 61, temperature 97.7 F (36.5 C), temperature source Oral, resp. rate 16, height 5\' 3"  (1.6 m), weight 78.6 kg, SpO2 92 %.        Intake/Output Summary (Last 24 hours) at 09/22/2020 0741 Last data filed at 09/22/2020 0555 Gross per 24 hour  Intake 480 ml  Output 800 ml  Net -320 ml   Danley Danker  Weights   09/18/20 0500 09/19/20 0500 09/21/20 0500  Weight: 74.3 kg 76.7 kg 78.6 kg    Examination: General: Adult female, acutely ill, lying in bed, NAD HEENT: MM pink/moist, anicteric, atraumatic, glasses in place neck supple Neuro: A&O x 4, able to follow commands, PERRL +3, MAE CV: s1s2 RRR, NSR-ST on monitor, no r/m/g Pulm: Regular, non labored on 6 L nasal cannula, breath sounds clear-BUL &  diminished-BLL GI: soft, rounded, non tender, bs x 4 Skin: Limited exam- no rashes/lesions noted Extremities: warm/dry, pulses + 2 R/P, no edema noted  Labs/imaging that I have personally reviewed  (right click and "Reselect all SmartList Selections" daily)  Na+/ K+: 135/3.7 BUN/Cr.:  27/1.28 Serum CO2/ AG: 23/14  Hgb: 14.7 WBC/ TMAX: 15.5/afebrile  CXR 09/15/2020: Stable left basilar scarring without acute or active cardiopulmonary disease Resolved Hospital Problem list     Assessment & Plan:  Acute Hypoxic Respiratory Failure secondary to choking event Leukocytosis- 15.5 Suspected Aspiration        -s/p bronchoscopy - aspirated food particles with mucoid debris  -hx of bipolar , aspiration risk is significant with possible dementia development -appropirate for outpatient follow up as of 09/22/20 - from pulm perspective  Acute Kidney Injury in the setting of ACE therapy Baseline Cr:1.0 , Cr on admission:1.28 - Strict I/O's: alert provider if UOP < 0.5 mL/kg/hr - gentle IVF hydration  - Daily BMP, replace electrolytes PRN - Avoid nephrotoxic agents as able, ensure adequate renal perfusion   Best practice (right click and "Reselect all SmartList Selections" daily)  Diet:  NPO Pain/Anxiety/Delirium protocol (if indicated): No VAP protocol (if indicated): Not indicated DVT prophylaxis: SCD * per primary GI prophylaxis: N/A* per primary Glucose control:  SSI Yes Central venous access:  N/A Arterial line:  N/A Foley:  N/A Mobility:  Per primary PT consulted: N/A Last date of multidisciplinary goals of care discussion 09/15/20 Code Status:  full code Disposition: PCU  Labs   CBC: Recent Labs  Lab 09/16/20 0608 09/17/20 0405 09/18/20 0548 09/19/20 0421 09/20/20 0816 09/21/20 0818 09/22/20 0501  WBC 15.2*   < > 12.6* 10.7* 8.8 7.9 7.5  NEUTROABS 13.2*  --  9.8* 8.0*  --   --   --   HGB 14.8   < > 11.7* 11.4* 12.2 12.3 11.8*  HCT 44.2   < > 34.5* 33.7* 37.3 36.2  35.3*  MCV 89.5   < > 88.5 87.8 89.4 87.7 87.8  PLT 246   < > 161 163 195 200 219   < > = values in this interval not displayed.    Basic Metabolic Panel: Recent Labs  Lab 09/16/20 0608 09/17/20 0405 09/18/20 0548 09/19/20 0421 09/20/20 0816 09/21/20 0818 09/22/20 0501  NA 136 138 137 137 136 139 137  K 4.1 3.2* 4.0 3.4* 3.4* 3.2* 3.2*  CL 98 102 103 102 101 103 103  CO2 22 26 27 27 25 27 26   GLUCOSE 268* 169* 136* 127* 102* 89 127*  BUN 27* 26* 16 12 9 8 8   CREATININE 1.45* 0.96 0.92 0.70 0.66 0.74 0.76  CALCIUM 9.5 9.1 8.3* 8.2* 8.7* 8.6* 8.3*  MG 1.5* 1.7 1.7 1.8  --   --   --   PHOS 3.4 2.7 2.5 2.7  --   --   --    GFR: Estimated Creatinine Clearance: 60.3 mL/min (by C-G formula based on SCr of 0.76 mg/dL). Recent Labs  Lab 09/16/20 [phone] 09/17/20 0405 09/18/20 0548 09/19/20 0421  09/20/20 0816 09/21/20 0818 09/22/20 0501  PROCALCITON 0.84 2.60 1.12  --   --   --   --   WBC 15.2* 15.2* 12.6* 10.7* 8.8 7.9 7.5    Liver Function Tests: Recent Labs  Lab 09/15/20 2151 09/15/20 2259 09/16/20 0608 09/18/20 0548 09/19/20 0421  AST 16 15 18   --   --   ALT 10 10 10   --   --   ALKPHOS 44 44 44  --   --   BILITOT 0.8 0.8 1.3*  --   --   PROT 7.5 7.4 7.2  --   --   ALBUMIN 3.6 3.7 3.3* 2.5* 2.3*   No results for input(s): LIPASE, AMYLASE in the last 168 hours. No results for input(s): AMMONIA in the last 168 hours.  ABG    Component Value Date/Time   PHART 7.36 09/16/2020 1003   PCO2ART 48 09/16/2020 1003   PO2ART 66 (L) 09/16/2020 1003   HCO3 27.1 09/16/2020 1003   O2SAT 91.9 09/16/2020 1003     Coagulation Profile: No results for input(s): INR, PROTIME in the last 168 hours.  Cardiac Enzymes: No results for input(s): CKTOTAL, CKMB, CKMBINDEX, TROPONINI in the last 168 hours.  HbA1C: Hemoglobin A1C  Date/Time Value Ref Range Status  03/26/2012 06:17 AM 6.7 (H) 4.2 - 6.3 % Final    Comment:    The American Diabetes Association recommends that a  primary goal of therapy should be <7% and that physicians should reevaluate the treatment regimen in patients with HbA1c values consistently >8%.    Hgb A1c MFr Bld  Date/Time Value Ref Range Status  07/24/2020 01:50 PM 14.2 (H) 4.8 - 5.6 % Final    Comment:    (NOTE) Pre diabetes:          5.7%-6.4%  Diabetes:              >6.4%  Glycemic control for   <7.0% adults with diabetes   01/10/2020 03:12 PM 9.2 (H) 4.8 - 5.6 % Final    Comment:    (NOTE)         Prediabetes: 5.7 - 6.4         Diabetes: >6.4         Glycemic control for adults with diabetes: <7.0     CBG: Recent Labs  Lab 09/21/20 1201 09/21/20 1643 09/21/20 2035 09/22/20 0011 09/22/20 0400  GLUCAP 188* 177* 227* 174* 137*    Review of Systems: Positives in BOLD  Gen: Denies fever, chills, weight change, fatigue, night sweats HEENT: Denies blurred vision, double vision, hearing loss, tinnitus, sinus congestion, rhinorrhea, sore throat, neck stiffness, dysphagia PULM: Denies shortness of breath, cough, sputum production, hemoptysis, wheezing CV: Denies chest pain, edema, orthopnea, paroxysmal nocturnal dyspnea, palpitations GI: Denies abdominal pain, nausea, vomiting, diarrhea, hematochezia, melena, constipation, change in bowel habits GU: Denies dysuria, hematuria, polyuria, oliguria, urethral discharge Endocrine: Denies hot or cold intolerance, polyuria, polyphagia or appetite change Derm: Denies rash, dry skin, scaling or peeling skin change Heme: Denies easy bruising, bleeding, bleeding gums Neuro: Denies headache, numbness, weakness, slurred speech, loss of memory or consciousness Past Medical History:  She,  has a past medical history of Arthritis, Breast cancer ([place]), Cancer ([place]) (2004), Diabetes mellitus without complication ([place]), Hypercholesterolemia, Hypertension, Hypothyroid, and Seasonal allergies.   Surgical History:   Past Surgical History:  Procedure Laterality Date  . ABDOMINAL  HYSTERECTOMY    . BREAST BIOPSY Left    neg  . BREAST  EXCISIONAL BIOPSY Right 2004   breast ca and lumpectomy  . BREAST LUMPECTOMY Right 2004   breast ca  . CHOLECYSTECTOMY    . COLONOSCOPY WITH PROPOFOL N/A 02/13/2017   Procedure: COLONOSCOPY WITH PROPOFOL;  Surgeon: Jonathon Bellows, MD;  Location: [facility] ENDOSCOPY;  Service: Gastroenterology;  Laterality: N/A;  . KIDNEY STONE SURGERY       Social History:   reports that she has quit smoking. Her smoking use included cigarettes. She has never used smokeless tobacco. She reports that she does not drink alcohol and does not use drugs.   Family History:  Her family history is negative for Bladder Cancer and Kidney cancer.   Allergies Allergies  Allergen Reactions  . Navane [Thiothixene] Other (See Comments)    Reaction:  Unknown  . Penicillins Rash    Has patient had a PCN reaction causing immediate rash, facial/tongue/throat swelling, SOB or lightheadedness with hypotension: No Has patient had a PCN reaction causing severe rash involving mucus membranes or skin necrosis: No Has patient had a PCN reaction that required hospitalization: No Has patient had a PCN reaction occurring within the last 10 years: No If all of the above answers are "NO", then may proceed with Cephalosporin use.  Has patient had a PCN reaction causing immediate rash, facial/tongue/throat swelling, SOB or lightheadedness with hypotension: No Has patient had a PCN reaction causing severe rash involving mucus membranes or skin necrosis: No Has patient had a PCN reaction that required hospitalization: No Has patient had a PCN reaction occurring within the last 10 years: No If all of the above answers are "NO", then may proceed with Cephalosporin use. Has patient had a PCN reaction causing immediate rash, facial/tongue/throat swelling, SOB or lightheadedness with hypotension: No Has patient had a PCN reaction causing severe rash involving mucus membranes or skin  necrosis: No Has patient had a PCN reaction that required hospitalization: No Has patient had a PCN reaction occurring within the last 10 years: No If all of the above answers are "NO", then may proceed with Cephalosporin use.     Home Medications  Prior to Admission medications   Medication Sig Start Date End Date Taking? Authorizing Provider  clonazePAM (KLONOPIN) 1 MG tablet Take 1 tablet (1 mg total) by mouth 2 (two) times daily. 08/24/20   Lorella Nimrod, MD  divalproex (DEPAKOTE ER) 500 MG 24 hr tablet Take 2 tablets (1,000 mg total) by mouth at bedtime. 01/11/20   Clapacs, Madie Reno, MD  enalapril (VASOTEC) 5 MG tablet Take 1 tablet (5 mg total) by mouth 2 (two) times daily. 01/11/20   Clapacs, Madie Reno, MD  FLUoxetine (PROZAC) 20 MG capsule Take 60 mg by mouth daily.    [provider]  insulin detemir (LEVEMIR) 100 UNIT/ML injection Inject 0.15 mLs (15 Units total) into the skin daily. 07/31/20   Charlynne Cousins, MD  LATUDA 20 MG TABS tablet Take 20 mg by mouth daily. 08/17/20   [provider]  levothyroxine (SYNTHROID) 88 MCG tablet Take 88 mcg by mouth daily before breakfast.    [provider]  linagliptin (TRADJENTA) 5 MG TABS tablet Take 5 mg by mouth daily.    [provider]  loratadine (CLARITIN) 10 MG tablet Take 1 tablet (10 mg total) by mouth daily. 01/11/20   Clapacs, Madie Reno, MD  metFORMIN (GLUCOPHAGE) 500 MG tablet Take 2 tablets (1,000 mg total) by mouth 2 (two) times daily with a meal. 07/31/20   Charlynne Cousins, MD  ondansetron (ZOFRAN-ODT) 4 MG disintegrating tablet Take 4 mg by mouth every 6 (six) hours as needed for nausea. 08/16/20   [provider]  rosuvastatin (CRESTOR) 40 MG tablet Take 1 tablet (40 mg total) by mouth daily. 01/11/20   Clapacs, Madie Reno, MD          Ottie Glazier, M.D.  [facility] [place]

## 2020-09-22 NOTE — Progress Notes (Signed)
PROGRESS NOTE    [name]  ZOX:[id number] DOB: [birth year] DOA: 09/15/2020 PCP: Mechele Claude, FNP   Assessment & Plan:   Principal Problem:   Bipolar 1 disorder with moderate mania ([place]) Active Problems:   Essential hypertension   Hypothyroidism   Dyslipidemia   Type 2 diabetes mellitus with hyperlipidemia ([place])   AKI (acute kidney injury) ([place])   Aspiration pneumonia ([place])   Acute respiratory failure with hypoxia ([place])   Acute hypoxemic respiratory failure ([place])   Acute hypoxic respiratory failure: likely secondary to aspiration. S/p intubation & bronchoscopy. Extubated on 09/17/20. Continue on IV flagyl. Completed rocephin course. Weaned off of supplemental oxygen   Likely aspiration pneumonia: completed abx course. Continue on bronchodilators and encourage incentive spirometry  Hypokalemia: KCl repleated. Will continue to monitor   Leukocytosis: resolved   Hypothyroidism: continue on home dose of levothyroxine   DM2: likely poorly controlled. Continue on lantus, SSI w/ accuchecks   Bipolar disorder: continue on home dose of latuda, fluoxetine, depakote. Haldol prn   Weakness: PT/OT recs SNF. Pt's insurance denied SNF and request peer to peer review and number called but unable to reach appropriate doc for peer to peer, CM notified    DVT prophylaxis: lovenox  Code Status: full  Family Communication:  Disposition Plan: likely d/c to SNF. Pt's insurance denied SNF and request peer to peer review and number called but unable to reach appropriate doc for peer to peer, CM notified    Level of care: Med-Surg   Status is: Inpatient  Remains inpatient appropriate because:Unsafe d/c plan and Inpatient level of care appropriate due to severity of illness   Dispo: The patient is from: Home              Anticipated d/c is to: SNF              Patient currently is medically stable to d/c.   Difficult to place patient No     Consultants:   Psych     Procedures:    Antimicrobials   Subjective: Pt c/o malaise   Objective: Vitals:   09/21/20 1541 09/21/20 1927 09/21/20 2038 09/22/20 0428  BP: 102/68 119/60 114/82 114/66  Pulse: 70 73 68 61  Resp: _0 Temp: (!) 97.3 F (36.3 C) 97.8 F (36.6 C) 98.6 F (37 C) 97.7 F (36.5 C)  TempSrc:  Oral  Oral  SpO2: 90% 92% 90% 92%  Weight:      Height:        Intake/Output Summary (Last 24 hours) at 09/22/2020 0731 Last data filed at 09/22/2020 0555 Gross per 24 hour  Intake 480 ml  Output 800 ml  Net -320 ml   Filed Weights   09/18/20 0500 09/19/20 0500 09/21/20 0500  Weight: 74.3 kg 76.7 kg 78.6 kg    Examination:  General exam: Appears calm and comfortable  Respiratory system: decreased breath sounds b/l  Cardiovascular system:  S1/S2+. No rubs or gallops   Gastrointestinal system: Abd is soft, NT, ND & hypoactive bowel sounds  Central nervous system: Alert and oriented. Moves all extremities  Psychiatry: Judgement and insight appear normal. Flat mood and affect     Data Reviewed: I have personally reviewed following labs and imaging studies  CBC: Recent Labs  Lab 09/16/20 0608 09/17/20 0405 09/18/20 0548 09/19/20 0421 09/20/20 0816 09/21/20 0818 09/22/20 0501  WBC 15.2*   < > 12.6* 10.7* 8.8 7.9 7.5  NEUTROABS 13.2*  --  9.8* 8.0*  --   --   --   HGB 14.8   < > 11.7* 11.4* 12.2 12.3 11.8*  HCT 44.2   < > 34.5* 33.7* 37.3 36.2 35.3*  MCV 89.5   < > 88.5 87.8 89.4 87.7 87.8  PLT 246   < > 161 163 195 200 219   < > = values in this interval not displayed.   Basic Metabolic Panel: Recent Labs  Lab 09/16/20 0608 09/17/20 0405 09/18/20 0548 09/19/20 0421 09/20/20 0816 09/21/20 0818 09/22/20 0501  NA 136 138 137 137 136 139 137  K 4.1 3.2* 4.0 3.4* 3.4* 3.2* 3.2*  CL 98 102 103 102 101 103 103  CO2 _0 GLUCOSE 268* 169* 136* 127* 102* 89 127*  BUN 27* 26* _1 CREATININE 1.45* 0.96 0.92 0.70 0.66 0.74 0.76   CALCIUM 9.5 9.1 8.3* 8.2* 8.7* 8.6* 8.3*  MG 1.5* 1.7 1.7 1.8  --   --   --   PHOS 3.4 2.7 2.5 2.7  --   --   --    GFR: Estimated Creatinine Clearance: 60.3 mL/min (by C-G formula based on SCr of 0.76 mg/dL). Liver Function Tests: Recent Labs  Lab 09/15/20 2151 09/15/20 2259 09/16/20 0608 09/18/20 0548 09/19/20 0421  AST _2 --   --   ALT _3 --   --   ALKPHOS 44 44 44  --   --   BILITOT 0.8 0.8 1.3*  --   --   PROT 7.5 7.4 7.2  --   --   ALBUMIN 3.6 3.7 3.3* 2.5* 2.3*   No results for input(s): LIPASE, AMYLASE in the last 168 hours. No results for input(s): AMMONIA in the last 168 hours. Coagulation Profile: No results for input(s): INR, PROTIME in the last 168 hours. Cardiac Enzymes: No results for input(s): CKTOTAL, CKMB, CKMBINDEX, TROPONINI in the last 168 hours. BNP (last 3 results) No results for input(s): PROBNP in the last 8760 hours. HbA1C: No results for input(s): HGBA1C in the last 72 hours. CBG: Recent Labs  Lab 09/21/20 1201 09/21/20 1643 09/21/20 2035 09/22/20 0011 09/22/20 0400  GLUCAP 188* 177* 227* 174* 137*   Lipid Profile: No results for input(s): CHOL, HDL, LDLCALC, TRIG, CHOLHDL, LDLDIRECT in the last 72 hours. Thyroid Function Tests: No results for input(s): TSH, T4TOTAL, FREET4, T3FREE, THYROIDAB in the last 72 hours. Anemia Panel: No results for input(s): VITAMINB12, FOLATE, FERRITIN, TIBC, IRON, RETICCTPCT in the last 72 hours. Sepsis Labs: Recent Labs  Lab 09/16/20 0608 09/17/20 0405 09/18/20 0548  PROCALCITON 0.84 2.60 1.12    Recent Results (from the past 240 hour(s))  Resp Panel by RT-PCR (Flu A&B, Covid) Nasopharyngeal Swab     Status: None   Collection Time: 09/15/20  8:52 PM   Specimen: Nasopharyngeal Swab; Nasopharyngeal(NP) swabs in vial transport medium  Result Value Ref Range Status   SARS Coronavirus 2 by RT PCR NEGATIVE NEGATIVE Final    Comment: (NOTE) SARS-CoV-2 target nucleic acids are NOT  DETECTED.  The SARS-CoV-2 RNA is generally detectable in upper respiratory specimens during the acute phase of infection. The lowest concentration of SARS-CoV-2 viral copies this assay can detect is 138 copies/mL. A negative result does not preclude SARS-Cov-2 infection and should not be used as the sole basis for treatment or other patient management decisions. A negative result may occur with  improper specimen collection/handling,  submission of specimen other than nasopharyngeal swab, presence of viral mutation(s) within the areas targeted by this assay, and inadequate number of viral copies(<138 copies/mL). A negative result must be combined with clinical observations, patient history, and epidemiological information. The expected result is Negative.  Fact Sheet for Patients:  EntrepreneurPulse.com.au  Fact Sheet for Healthcare Providers:  IncredibleEmployment.be  This test is no t yet approved or cleared by the [place] FDA and  has been authorized for detection and/or diagnosis of SARS-CoV-2 by FDA under an Emergency Use Authorization (EUA). This EUA will remain  in effect (meaning this test can be used) for the duration of the COVID-19 declaration under Section 564(b)(1) of the Act, 21 U.S.C.section 360bbb-3(b)(1), unless the authorization is terminated  or revoked sooner.       Influenza A by PCR NEGATIVE NEGATIVE Final   Influenza B by PCR NEGATIVE NEGATIVE Final    Comment: (NOTE) The Xpert Xpress SARS-CoV-2/FLU/RSV plus assay is intended as an aid in the diagnosis of influenza from Nasopharyngeal swab specimens and should not be used as a sole basis for treatment. Nasal washings and aspirates are unacceptable for Xpert Xpress SARS-CoV-2/FLU/RSV testing.  Fact Sheet for Patients: EntrepreneurPulse.com.au  Fact Sheet for Healthcare Providers: IncredibleEmployment.be  This test is not yet  approved or cleared by the [place] FDA and has been authorized for detection and/or diagnosis of SARS-CoV-2 by FDA under an Emergency Use Authorization (EUA). This EUA will remain in effect (meaning this test can be used) for the duration of the COVID-19 declaration under Section 564(b)(1) of the Act, 21 U.S.C. section 360bbb-3(b)(1), unless the authorization is terminated or revoked.  Performed at [facility], [address] [place], [place] [address]   Culture, blood (Routine X 2) w Reflex to ID Panel     Status: None   Collection Time: 09/16/20  6:08 AM   Specimen: BLOOD  Result Value Ref Range Status   Specimen Description BLOOD RIGHT ANTECUBITAL  Final   Special Requests   Final    BOTTLES DRAWN AEROBIC AND ANAEROBIC Blood Culture adequate volume   Culture   Final    NO GROWTH 5 DAYS Performed at [facility], [address], [place], [place] [address]    Report Status 09/21/2020 FINAL  Final  Culture, blood (Routine X 2) w Reflex to ID Panel     Status: None   Collection Time: 09/16/20  7:24 AM   Specimen: BLOOD  Result Value Ref Range Status   Specimen Description BLOOD BLOOD LEFT HAND  Final   Special Requests   Final    BOTTLES DRAWN AEROBIC AND ANAEROBIC Blood Culture adequate volume   Culture   Final    NO GROWTH 5 DAYS Performed at [facility], [address] [place], [place] [address]    Report Status 09/21/2020 FINAL  Final  Culture, BAL-quantitative w Gram Stain     Status: Abnormal   Collection Time: 09/16/20 10:31 AM   Specimen: Bronchoalveolar Lavage; Respiratory  Result Value Ref Range Status   Specimen Description   Final    BRONCHIAL ALVEOLAR LAVAGE Performed at [facility], [address], [place], [place] [address]    Special Requests   Final    NONE Performed at [facility], [address] [place], [place] [address]    Gram Stain   Final    MODERATE WBC PRESENT,BOTH PMN AND  MONONUCLEAR NO ORGANISMS SEEN    Culture (A)  Final  10,000 COLONIES/mL Normal respiratory flora-no Staph aureus or Pseudomonas seen Performed at [facility], [place] [address], [place], [place] [address]    Report Status 09/18/2020 FINAL  Final  Culture, BAL-quantitative w Gram Stain     Status: Abnormal   Collection Time: 09/16/20 10:31 AM   Specimen: Bronchial Wash; Respiratory  Result Value Ref Range Status   Specimen Description   Final    BRONCHIAL WASHINGS Performed at [facility], [address] [place], [place] [address]    Special Requests   Final    TRACHEAL ASPIRATE Performed at [facility], [address] [place], [place] [address]    Gram Stain   Final    ABUNDANT WBC PRESENT, PREDOMINANTLY PMN RARE GRAM POSITIVE COCCI    Culture (A)  Final    20,000 COLONIES/mL Normal respiratory flora-no Staph aureus or Pseudomonas seen Performed at [facility] [address] [place], [place] [address]    Report Status 09/18/2020 FINAL  Final  MRSA PCR Screening     Status: None   Collection Time: 09/16/20  1:30 PM   Specimen: Nasopharyngeal  Result Value Ref Range Status   MRSA by PCR NEGATIVE NEGATIVE Final    Comment:        The GeneXpert MRSA Assay (FDA approved for NASAL specimens only), is one component of a comprehensive MRSA colonization surveillance program. It is not intended to diagnose MRSA infection nor to guide or monitor treatment for MRSA infections. Performed at [facility], [address] [place], [place] [address]          Radiology Studies: No results found.      Scheduled Meds: . chlorhexidine gluconate (MEDLINE KIT)  15 mL Mouth Rinse BID  . Chlorhexidine Gluconate Cloth  6 each Topical Q2200  . divalproex  500 mg Oral Q12H  . enoxaparin (LOVENOX) injection  40 mg Subcutaneous Q24H  . FLUoxetine  60 mg Oral Daily  . insulin aspart  0-15 Units Subcutaneous Q4H  . insulin glargine   6 Units Subcutaneous QHS  . levothyroxine  88 mcg Oral QAC breakfast  . lurasidone  20 mg Oral Daily  . pantoprazole  40 mg Oral Q24H  . thiamine injection  100 mg Intravenous Daily  . vitamin B-12  1,000 mcg Oral Daily   Continuous Infusions: . sodium chloride Stopped (09/20/20 0523)     LOS: 7 days    Time spent: 30 mins    Wyvonnia Dusky, MD [facility] Pager [phone]  If 7PM-7AM, please contact night-coverage 09/22/2020, 7:31 AM

## 2020-09-23 ENCOUNTER — Other Ambulatory Visit: Payer: Self-pay

## 2020-09-23 DIAGNOSIS — R531 Weakness: Secondary | ICD-10-CM | POA: Diagnosis not present

## 2020-09-23 DIAGNOSIS — E876 Hypokalemia: Secondary | ICD-10-CM | POA: Diagnosis not present

## 2020-09-23 LAB — GLUCOSE, CAPILLARY
Glucose-Capillary: 103 mg/dL — ABNORMAL HIGH (ref 70–99)
Glucose-Capillary: 119 mg/dL — ABNORMAL HIGH (ref 70–99)
Glucose-Capillary: 201 mg/dL — ABNORMAL HIGH (ref 70–99)
Glucose-Capillary: 207 mg/dL — ABNORMAL HIGH (ref 70–99)
Glucose-Capillary: 281 mg/dL — ABNORMAL HIGH (ref 70–99)
Glucose-Capillary: 94 mg/dL (ref 70–99)

## 2020-09-23 LAB — BASIC METABOLIC PANEL
Anion gap: 8 (ref 5–15)
BUN: 7 mg/dL — ABNORMAL LOW (ref 8–23)
CO2: 27 mmol/L (ref 22–32)
Calcium: 8.4 mg/dL — ABNORMAL LOW (ref 8.9–10.3)
Chloride: 103 mmol/L (ref 98–111)
Creatinine, Ser: 0.75 mg/dL (ref 0.44–1.00)
GFR, Estimated: >60 mL/min (ref >60)
Glucose, Bld: 102 mg/dL — ABNORMAL HIGH (ref 70–99)
Potassium: 3.5 mmol/L (ref 3.5–5.1)
Sodium: 138 mmol/L (ref 135–145)

## 2020-09-23 LAB — CBC
HCT: 35 % — ABNORMAL LOW (ref 36.0–46.0)
Hemoglobin: 11.8 g/dL — ABNORMAL LOW (ref 12.0–15.0)
MCH: 30.3 pg (ref 26.0–34.0)
MCHC: 33.7 g/dL (ref 30.0–36.0)
MCV: 90 fL (ref 80.0–100.0)
Platelets: 224 10*3/uL (ref 150–400)
RBC: 3.89 MIL/uL (ref 3.87–5.11)
RDW: 14.8 % (ref 11.5–15.5)
WBC: 7 10*3/uL (ref 4.0–10.5)
nRBC: 0 % (ref 0.0–0.2)

## 2020-09-23 NOTE — Plan of Care (Signed)
  Problem: Activity: Goal: Ability to tolerate increased activity will improve Outcome: Progressing   

## 2020-09-23 NOTE — Progress Notes (Signed)
NAME:  Leonila Speranza, MRN:  [id number], DOB:  August 16, 1945, LOS: 8 ADMISSION DATE:  09/15/2020, CONSULTATION DATE: 09/15/2020 REFERRING MD: Dr. Kerman Passey, CHIEF COMPLAINT: Shortness of breath  History of Present Illness:  75 year old female arrived at [facility] ED via EMS from her nursing facility where she had been in her normal state of health until choking on a hot dog during her last meal.  Per ED documentation and EMS report the patient was able to cough up chunks of hot dog followed by frothy sputum and some difficulty breathing. Per ED documentation EMS stated the patient's SPO2 was 80%, she was placed on nonrebreather and transported to the ED.   ED course: Upon arrival patient's SPO2 was 90% on 15 L.  Once the patient was settled in a stretcher and sat up having calm down oxygen was able to be titrated to 6 L nasal cannula.  Initial vitals: Afebrile at 97.5, RR 14, NSR 95, BP 104/72 with a MAP of 83, SPO2 94% on 6 L nasal cannula. Significant labs: AKI with BUN/Cr: 27/1.28, leukocytosis at 15.5, hyperglycemia at 193, COVID-19 negative, CXR negative for acute disease.  PCCM consulted to evaluate possible bronchoscopy.   09/16/20- patient continued to cough out aspirated foreign body material but continued to remain hypoxemic and required significant supplemental O2.  She is full code and has no family listed per emergency contact. Donnald Garre called her family friend listed on EMR as next of kin [name]) and we discussed current critically ill state with ongoing hypoxemia. We discussed now patient has failure of BIPAP as well and will require ETT and bronchoscopy for FB aspiration.  09/17/20- patient on spontaneous trial.  She did well after bronchoscopy and was able to be weaned down on FiO2, today plan is to liberate from MV and hopefully transfer out to medical floor with TRH. I reviewed plan with Dr Leslye Peer.   09/20/20 - patient remains on medical floor. There is palliative care consultation  due to clinical signs of indicative of poor prognosis long term.  She continues to have physical deconditioning,   09/23/20- patietn is improved. She is lucid and appropirate. States she wants to go back to [facility] healthcare. She is on room air now. From pulm perspective she can safely be dcd with outpatient follow up.   Pertinent  Medical History  Hypertension Hypothyroidism Hypercholesterolemia Type 2 diabetes mellitus Breast cancer -right breast 2004 Arthritis MDD Bipolar disorder PTSD Significant Hospital Events: Including procedures, antibiotic start and stop dates in addition to other pertinent events   . 09/15/20- Patient admitted to PCU after " choking event" and acute oxygen need.  Interim History / Subjective:  Patient alert and responsive sitting up in ED stretcher able to self suction with Yankauer device.  Audible secretions at back of throat the patient able to cough them up and self suction appropriately.  Patient denies any previous events with difficulty swallowing.  Patient admits some mild continued shortness of breath, denies need for oxygen use at home & only other associated symptom is a sore throat when attempting to swallow.  Objective   Blood pressure (!) 144/70, pulse (!) 59, temperature 98.2 F (36.8 C), temperature source Oral, resp. rate 16, height 5\' 3"  (1.6 m), weight 78.6 kg, SpO2 93 %.        Intake/Output Summary (Last 24 hours) at 09/23/2020 1004 Last data filed at 09/23/2020 0000 Gross per 24 hour  Intake 120 ml  Output 1000 ml  Net -880 ml  Filed Weights   09/18/20 0500 09/19/20 0500 09/21/20 0500  Weight: 74.3 kg 76.7 kg 78.6 kg    Examination: General: Adult female, acutely ill, lying in bed, NAD HEENT: MM pink/moist, anicteric, atraumatic, glasses in place neck supple Neuro: A&O x 4, able to follow commands, PERRL +3, MAE CV: s1s2 RRR, NSR-ST on monitor, no r/m/g Pulm: Regular, non labored on 6 L nasal cannula, breath sounds clear-BUL &  diminished-BLL GI: soft, rounded, non tender, bs x 4 Skin: Limited exam- no rashes/lesions noted Extremities: warm/dry, pulses + 2 R/P, no edema noted  Labs/imaging that I have personally reviewed  (right click and "Reselect all SmartList Selections" daily)  Na+/ K+: 135/3.7 BUN/Cr.:  27/1.28 Serum CO2/ AG: 23/14  Hgb: 14.7 WBC/ TMAX: 15.5/afebrile  CXR 09/15/2020: Stable left basilar scarring without acute or active cardiopulmonary disease Resolved Hospital Problem list     Assessment & Plan:  Acute Hypoxic Respiratory Failure secondary to choking event Leukocytosis- 15.5 Suspected Aspiration        -s/p bronchoscopy - aspirated food particles with mucoid debris  -hx of bipolar , aspiration risk is significant with possible dementia development -appropirate for outpatient follow up as of 09/22/20 - from pulm perspective  Acute Kidney Injury in the setting of ACE therapy Baseline Cr:1.0 , Cr on admission:1.28 - Strict I/O's: alert provider if UOP < 0.5 mL/kg/hr - gentle IVF hydration  - Daily BMP, replace electrolytes PRN - Avoid nephrotoxic agents as able, ensure adequate renal perfusion   Best practice (right click and "Reselect all SmartList Selections" daily)  Diet:  NPO Pain/Anxiety/Delirium protocol (if indicated): No VAP protocol (if indicated): Not indicated DVT prophylaxis: SCD * per primary GI prophylaxis: N/A* per primary Glucose control:  SSI Yes Central venous access:  N/A Arterial line:  N/A Foley:  N/A Mobility:  Per primary PT consulted: N/A Last date of multidisciplinary goals of care discussion 09/15/20 Code Status:  full code Disposition: PCU  Labs   CBC: Recent Labs  Lab 09/18/20 0548 09/19/20 0421 09/20/20 0816 09/21/20 0818 09/22/20 0501 09/23/20 0511  WBC 12.6* 10.7* 8.8 7.9 7.5 7.0  NEUTROABS 9.8* 8.0*  --   --   --   --   HGB 11.7* 11.4* 12.2 12.3 11.8* 11.8*  HCT 34.5* 33.7* 37.3 36.2 35.3* 35.0*  MCV 88.5 87.8 89.4 87.7 87.8 90.0   PLT 161 163 195 200 219 793    Basic Metabolic Panel: Recent Labs  Lab 09/17/20 0405 09/18/20 0548 09/19/20 0421 09/20/20 0816 09/21/20 0818 09/22/20 0501 09/23/20 0511  NA 138 137 137 136 139 137 138  K 3.2* 4.0 3.4* 3.4* 3.2* 3.2* 3.5  CL 102 103 102 101 103 103 103  CO2 26 27 27 25 27 26 27   GLUCOSE 169* 136* 127* 102* 89 127* 102*  BUN 26* 16 12 9 8 8  7*  CREATININE 0.96 0.92 0.70 0.66 0.74 0.76 0.75  CALCIUM 9.1 8.3* 8.2* 8.7* 8.6* 8.3* 8.4*  MG 1.7 1.7 1.8  --   --   --   --   PHOS 2.7 2.5 2.7  --   --   --   --    GFR: Estimated Creatinine Clearance: 60.3 mL/min (by C-G formula based on SCr of 0.75 mg/dL). Recent Labs  Lab 09/17/20 0405 09/18/20 0548 09/19/20 0421 09/20/20 0816 09/21/20 0818 09/22/20 0501 09/23/20 0511  PROCALCITON 2.60 1.12  --   --   --   --   --   WBC 15.2* 12.6*   < >  8.8 7.9 7.5 7.0   < > = values in this interval not displayed.    Liver Function Tests: Recent Labs  Lab 09/18/20 0548 09/19/20 0421  ALBUMIN 2.5* 2.3*   No results for input(s): LIPASE, AMYLASE in the last 168 hours. No results for input(s): AMMONIA in the last 168 hours.  ABG    Component Value Date/Time   PHART 7.36 09/16/2020 1003   PCO2ART 48 09/16/2020 1003   PO2ART 66 (L) 09/16/2020 1003   HCO3 27.1 09/16/2020 1003   O2SAT 91.9 09/16/2020 1003     Coagulation Profile: No results for input(s): INR, PROTIME in the last 168 hours.  Cardiac Enzymes: No results for input(s): CKTOTAL, CKMB, CKMBINDEX, TROPONINI in the last 168 hours.  HbA1C: Hemoglobin A1C  Date/Time Value Ref Range Status  03/26/2012 06:17 AM 6.7 (H) 4.2 - 6.3 % Final    Comment:    The American Diabetes Association recommends that a primary goal of therapy should be <7% and that physicians should reevaluate the treatment regimen in patients with HbA1c values consistently >8%.    Hgb A1c MFr Bld  Date/Time Value Ref Range Status  07/24/2020 01:50 PM 14.2 (H) 4.8 - 5.6 % Final     Comment:    (NOTE) Pre diabetes:          5.7%-6.4%  Diabetes:              >6.4%  Glycemic control for   <7.0% adults with diabetes   01/10/2020 03:12 PM 9.2 (H) 4.8 - 5.6 % Final    Comment:    (NOTE)         Prediabetes: 5.7 - 6.4         Diabetes: >6.4         Glycemic control for adults with diabetes: <7.0     CBG: Recent Labs  Lab 09/22/20 1656 09/22/20 2030 09/23/20 0038 09/23/20 0433 09/23/20 0835  GLUCAP 110* 263* 94 103* 119*    Review of Systems: Positives in BOLD  Gen: Denies fever, chills, weight change, fatigue, night sweats HEENT: Denies blurred vision, double vision, hearing loss, tinnitus, sinus congestion, rhinorrhea, sore throat, neck stiffness, dysphagia PULM: Denies shortness of breath, cough, sputum production, hemoptysis, wheezing CV: Denies chest pain, edema, orthopnea, paroxysmal nocturnal dyspnea, palpitations GI: Denies abdominal pain, nausea, vomiting, diarrhea, hematochezia, melena, constipation, change in bowel habits GU: Denies dysuria, hematuria, polyuria, oliguria, urethral discharge Endocrine: Denies hot or cold intolerance, polyuria, polyphagia or appetite change Derm: Denies rash, dry skin, scaling or peeling skin change Heme: Denies easy bruising, bleeding, bleeding gums Neuro: Denies headache, numbness, weakness, slurred speech, loss of memory or consciousness Past Medical History:  She,  has a past medical history of Arthritis, Breast cancer ([place]), Cancer ([place]) (2004), Diabetes mellitus without complication ([place]), Hypercholesterolemia, Hypertension, Hypothyroid, and Seasonal allergies.   Surgical History:   Past Surgical History:  Procedure Laterality Date  . ABDOMINAL HYSTERECTOMY    . BREAST BIOPSY Left    neg  . BREAST EXCISIONAL BIOPSY Right 2004   breast ca and lumpectomy  . BREAST LUMPECTOMY Right 2004   breast ca  . CHOLECYSTECTOMY    . COLONOSCOPY WITH PROPOFOL N/A 02/13/2017   Procedure: COLONOSCOPY WITH  PROPOFOL;  Surgeon: Jonathon Bellows, MD;  Location: [facility] ENDOSCOPY;  Service: Gastroenterology;  Laterality: N/A;  . KIDNEY STONE SURGERY       Social History:   reports that she has quit smoking. Her smoking use included cigarettes. She has  never used smokeless tobacco. She reports that she does not drink alcohol and does not use drugs.   Family History:  Her family history is negative for Bladder Cancer and Kidney cancer.   Allergies Allergies  Allergen Reactions  . Navane [Thiothixene] Other (See Comments)    Reaction:  Unknown  . Penicillins Rash    Has patient had a PCN reaction causing immediate rash, facial/tongue/throat swelling, SOB or lightheadedness with hypotension: No Has patient had a PCN reaction causing severe rash involving mucus membranes or skin necrosis: No Has patient had a PCN reaction that required hospitalization: No Has patient had a PCN reaction occurring within the last 10 years: No If all of the above answers are "NO", then may proceed with Cephalosporin use.  Has patient had a PCN reaction causing immediate rash, facial/tongue/throat swelling, SOB or lightheadedness with hypotension: No Has patient had a PCN reaction causing severe rash involving mucus membranes or skin necrosis: No Has patient had a PCN reaction that required hospitalization: No Has patient had a PCN reaction occurring within the last 10 years: No If all of the above answers are "NO", then may proceed with Cephalosporin use. Has patient had a PCN reaction causing immediate rash, facial/tongue/throat swelling, SOB or lightheadedness with hypotension: No Has patient had a PCN reaction causing severe rash involving mucus membranes or skin necrosis: No Has patient had a PCN reaction that required hospitalization: No Has patient had a PCN reaction occurring within the last 10 years: No If all of the above answers are "NO", then may proceed with Cephalosporin use.     Home Medications  Prior to  Admission medications   Medication Sig Start Date End Date Taking? Authorizing Provider  clonazePAM (KLONOPIN) 1 MG tablet Take 1 tablet (1 mg total) by mouth 2 (two) times daily. 08/24/20   Lorella Nimrod, MD  divalproex (DEPAKOTE ER) 500 MG 24 hr tablet Take 2 tablets (1,000 mg total) by mouth at bedtime. 01/11/20   Clapacs, Madie Reno, MD  enalapril (VASOTEC) 5 MG tablet Take 1 tablet (5 mg total) by mouth 2 (two) times daily. 01/11/20   Clapacs, Madie Reno, MD  FLUoxetine (PROZAC) 20 MG capsule Take 60 mg by mouth daily.    [provider]  insulin detemir (LEVEMIR) 100 UNIT/ML injection Inject 0.15 mLs (15 Units total) into the skin daily. 07/31/20   Charlynne Cousins, MD  LATUDA 20 MG TABS tablet Take 20 mg by mouth daily. 08/17/20   [provider]  levothyroxine (SYNTHROID) 88 MCG tablet Take 88 mcg by mouth daily before breakfast.    [provider]  linagliptin (TRADJENTA) 5 MG TABS tablet Take 5 mg by mouth daily.    [provider]  loratadine (CLARITIN) 10 MG tablet Take 1 tablet (10 mg total) by mouth daily. 01/11/20   Clapacs, Madie Reno, MD  metFORMIN (GLUCOPHAGE) 500 MG tablet Take 2 tablets (1,000 mg total) by mouth 2 (two) times daily with a meal. 07/31/20   Charlynne Cousins, MD  ondansetron (ZOFRAN-ODT) 4 MG disintegrating tablet Take 4 mg by mouth every 6 (six) hours as needed for nausea. 08/16/20   [provider]  rosuvastatin (CRESTOR) 40 MG tablet Take 1 tablet (40 mg total) by mouth daily. 01/11/20   Clapacs, Madie Reno, MD          Ottie Glazier, M.D.  Pulmonary & [place]

## 2020-09-23 NOTE — Progress Notes (Addendum)
PROGRESS NOTE    [name]  YYQ:[id number] DOB: 1945-07-20 DOA: 09/15/2020 PCP: Mechele Claude, FNP   Assessment & Plan:   Principal Problem:   Bipolar 1 disorder with moderate mania ([place]) Active Problems:   Essential hypertension   Hypothyroidism   Dyslipidemia   Type 2 diabetes mellitus with hyperlipidemia ([place])   AKI (acute kidney injury) ([place])   Aspiration pneumonia ([place])   Acute respiratory failure with hypoxia ([place])   Acute hypoxemic respiratory failure ([place])   Acute hypoxic respiratory failure: likely secondary to aspiration. S/p intubation & bronchoscopy. Extubated on 09/17/20. Completed abx course. Resolved   Likely aspiration pneumonia: completed abx course. Continue on bronchodilators and encourage incentive spirometry  Hypokalemia: WNL today   Leukocytosis: resolved   Hypothyroidism: continue on home dose of levothyroxine   DM2: likely poorly controlled. Continue on lantus, SSI w/ accuchecks   Bipolar disorder: continue on home dose of fluoxetine, depakote, latuda. Haldol prn   Weakness: PT/OT recs SNF. Pt's insurance denied SNF and request peer to peer review and number called on 09/22/20 but unable to reach appropriate doc for peer to peer, CM notified and was given another number for which the company said that peer to peer was closed now despite calling w/in 1 hour of being told there was a requested peer to peer review. CM notified    DVT prophylaxis: lovenox  Code Status: full  Family Communication:  Disposition Plan: likely d/c to SNF.    Level of care: Med-Surg   Status is: Inpatient  Remains inpatient appropriate because:Unsafe d/c plan and Inpatient level of care appropriate due to severity of illness. Pt's insurance denied SNF and request peer to peer review and number called on 09/22/20 but unable to reach appropriate doc for peer to peer, CM notified and was given another number for which the company said that peer to peer was closed  now despite calling w/in 30-35 mins of being told there was a requested peer to peer review. CM notified   Dispo: The patient is from: Home              Anticipated d/c is to: SNF              Patient currently is medically stable to d/c.   Difficult to place patient No     Consultants:   Psych    Procedures:    Antimicrobials   Subjective: Pt c/o fatigue   Objective: Vitals:   09/22/20 1556 09/22/20 2031 09/23/20 0518 09/23/20 0746  BP: 129/78 112/63 117/64 (!) 144/70  Pulse: 60 66 (!) 54 (!) 52  Resp: 16 16    Temp: 98.2 F (36.8 C) 98 F (36.7 C) 98.1 F (36.7 C)   TempSrc: Oral  Oral   SpO2: 94% 93% 91% 94%  Weight:      Height:        Intake/Output Summary (Last 24 hours) at 09/23/2020 0752 Last data filed at 09/23/2020 0000 Gross per 24 hour  Intake 120 ml  Output 1000 ml  Net -880 ml   Filed Weights   09/18/20 0500 09/19/20 0500 09/21/20 0500  Weight: 74.3 kg 76.7 kg 78.6 kg    Examination:  General exam: Appears comfortable  Respiratory system: diminished breath sounds b/l Cardiovascular system:  S1 & S2+. No rubs or clicks  Gastrointestinal system: Abd is soft, NT, ND & normal bowel sounds  Central nervous system: Alert and oriented. Moves all extremities  Psychiatry: Judgement and insight  appear normal. Flat mood and affect     Data Reviewed: I have personally reviewed following labs and imaging studies  CBC: Recent Labs  Lab 09/18/20 0548 09/19/20 0421 09/20/20 0816 09/21/20 0818 09/22/20 0501 09/23/20 0511  WBC 12.6* 10.7* 8.8 7.9 7.5 7.0  NEUTROABS 9.8* 8.0*  --   --   --   --   HGB 11.7* 11.4* 12.2 12.3 11.8* 11.8*  HCT 34.5* 33.7* 37.3 36.2 35.3* 35.0*  MCV 88.5 87.8 89.4 87.7 87.8 90.0  PLT 161 163 195 200 219 103   Basic Metabolic Panel: Recent Labs  Lab 09/17/20 0405 09/18/20 0548 09/19/20 0421 09/20/20 0816 09/21/20 0818 09/22/20 0501 09/23/20 0511  NA 138 137 137 136 139 137 138  K 3.2* 4.0 3.4* 3.4* 3.2*  3.2* 3.5  CL 102 103 102 101 103 103 103  CO2 _0 GLUCOSE 169* 136* 127* 102* 89 127* 102*  BUN 26* _1 7*  CREATININE 0.96 0.92 0.70 0.66 0.74 0.76 0.75  CALCIUM 9.1 8.3* 8.2* 8.7* 8.6* 8.3* 8.4*  MG 1.7 1.7 1.8  --   --   --   --   PHOS 2.7 2.5 2.7  --   --   --   --    GFR: Estimated Creatinine Clearance: 60.3 mL/min (by C-G formula based on SCr of 0.75 mg/dL). Liver Function Tests: Recent Labs  Lab 09/18/20 0548 09/19/20 0421  ALBUMIN 2.5* 2.3*   No results for input(s): LIPASE, AMYLASE in the last 168 hours. No results for input(s): AMMONIA in the last 168 hours. Coagulation Profile: No results for input(s): INR, PROTIME in the last 168 hours. Cardiac Enzymes: No results for input(s): CKTOTAL, CKMB, CKMBINDEX, TROPONINI in the last 168 hours. BNP (last 3 results) No results for input(s): PROBNP in the last 8760 hours. HbA1C: No results for input(s): HGBA1C in the last 72 hours. CBG: Recent Labs  Lab 09/22/20 1122 09/22/20 1656 09/22/20 2030 09/23/20 0038 09/23/20 0433  GLUCAP 191* 110* 263* 94 103*   Lipid Profile: No results for input(s): CHOL, HDL, LDLCALC, TRIG, CHOLHDL, LDLDIRECT in the last 72 hours. Thyroid Function Tests: No results for input(s): TSH, T4TOTAL, FREET4, T3FREE, THYROIDAB in the last 72 hours. Anemia Panel: No results for input(s): VITAMINB12, FOLATE, FERRITIN, TIBC, IRON, RETICCTPCT in the last 72 hours. Sepsis Labs: Recent Labs  Lab 09/17/20 0405 09/18/20 0548  PROCALCITON 2.60 1.12    Recent Results (from the past 240 hour(s))  Resp Panel by RT-PCR (Flu A&B, Covid) Nasopharyngeal Swab     Status: None   Collection Time: 09/15/20  8:52 PM   Specimen: Nasopharyngeal Swab; Nasopharyngeal(NP) swabs in vial transport medium  Result Value Ref Range Status   SARS Coronavirus 2 by RT PCR NEGATIVE NEGATIVE Final    Comment: (NOTE) SARS-CoV-2 target nucleic acids are NOT DETECTED.  The SARS-CoV-2 RNA is  generally detectable in upper respiratory specimens during the acute phase of infection. The lowest concentration of SARS-CoV-2 viral copies this assay can detect is 138 copies/mL. A negative result does not preclude SARS-Cov-2 infection and should not be used as the sole basis for treatment or other patient management decisions. A negative result may occur with  improper specimen collection/handling, submission of specimen other than nasopharyngeal swab, presence of viral mutation(s) within the areas targeted by this assay, and inadequate number of viral copies(<138 copies/mL). A negative result must be combined with clinical observations, patient history, and epidemiological  information. The expected result is Negative.  Fact Sheet for Patients:  EntrepreneurPulse.com.au  Fact Sheet for Healthcare Providers:  IncredibleEmployment.be  This test is no t yet approved or cleared by the [place] FDA and  has been authorized for detection and/or diagnosis of SARS-CoV-2 by FDA under an Emergency Use Authorization (EUA). This EUA will remain  in effect (meaning this test can be used) for the duration of the COVID-19 declaration under Section 564(b)(1) of the Act, 21 U.S.C.section 360bbb-3(b)(1), unless the authorization is terminated  or revoked sooner.       Influenza A by PCR NEGATIVE NEGATIVE Final   Influenza B by PCR NEGATIVE NEGATIVE Final    Comment: (NOTE) The Xpert Xpress SARS-CoV-2/FLU/RSV plus assay is intended as an aid in the diagnosis of influenza from Nasopharyngeal swab specimens and should not be used as a sole basis for treatment. Nasal washings and aspirates are unacceptable for Xpert Xpress SARS-CoV-2/FLU/RSV testing.  Fact Sheet for Patients: EntrepreneurPulse.com.au  Fact Sheet for Healthcare Providers: IncredibleEmployment.be  This test is not yet approved or cleared by the [place] FDA and has been authorized for detection and/or diagnosis of SARS-CoV-2 by FDA under an Emergency Use Authorization (EUA). This EUA will remain in effect (meaning this test can be used) for the duration of the COVID-19 declaration under Section 564(b)(1) of the Act, 21 U.S.C. section 360bbb-3(b)(1), unless the authorization is terminated or revoked.  Performed at [facility], [address] [place], [place] [address]   Culture, blood (Routine X 2) w Reflex to ID Panel     Status: None   Collection Time: 09/16/20  6:08 AM   Specimen: BLOOD  Result Value Ref Range Status   Specimen Description BLOOD RIGHT ANTECUBITAL  Final   Special Requests   Final    BOTTLES DRAWN AEROBIC AND ANAEROBIC Blood Culture adequate volume   Culture   Final    NO GROWTH 5 DAYS Performed at [facility], [address] [place], [place] [address]    Report Status 09/21/2020 FINAL  Final  Culture, blood (Routine X 2) w Reflex to ID Panel     Status: None   Collection Time: 09/16/20  7:24 AM   Specimen: BLOOD  Result Value Ref Range Status   Specimen Description BLOOD BLOOD LEFT HAND  Final   Special Requests   Final    BOTTLES DRAWN AEROBIC AND ANAEROBIC Blood Culture adequate volume   Culture   Final    NO GROWTH 5 DAYS Performed at [facility], [address], [place], [place] [address]    Report Status 09/21/2020 FINAL  Final  Culture, BAL-quantitative w Gram Stain     Status: Abnormal   Collection Time: 09/16/20 10:31 AM   Specimen: Bronchoalveolar Lavage; Respiratory  Result Value Ref Range Status   Specimen Description   Final    BRONCHIAL ALVEOLAR LAVAGE Performed at [facility], [address], [place], [place] [address]    Special Requests   Final    NONE Performed at [facility], [place], [place] [address]    Gram Stain   Final    MODERATE WBC PRESENT,BOTH PMN AND MONONUCLEAR NO ORGANISMS SEEN    Culture  (A)  Final    10,000 COLONIES/mL Normal respiratory flora-no Staph aureus or Pseudomonas seen Performed at [facility] [address], [place], [place] [address]    Report Status 09/18/2020 FINAL  Final  Culture, BAL-quantitative w  Gram Stain     Status: Abnormal   Collection Time: 09/16/20 10:31 AM   Specimen: Bronchial Wash; Respiratory  Result Value Ref Range Status   Specimen Description   Final    BRONCHIAL WASHINGS Performed at [facility], [address] [place], [place] [address]    Special Requests   Final    TRACHEAL ASPIRATE Performed at [facility], [address] [place], [place] [address]    Gram Stain   Final    ABUNDANT WBC PRESENT, PREDOMINANTLY PMN RARE GRAM POSITIVE COCCI    Culture (A)  Final    20,000 COLONIES/mL Normal respiratory flora-no Staph aureus or Pseudomonas seen Performed at [facility] [address], [place], [place] [address]    Report Status 09/18/2020 FINAL  Final  MRSA PCR Screening     Status: None   Collection Time: 09/16/20  1:30 PM   Specimen: Nasopharyngeal  Result Value Ref Range Status   MRSA by PCR NEGATIVE NEGATIVE Final    Comment:        The GeneXpert MRSA Assay (FDA approved for NASAL specimens only), is one component of a comprehensive MRSA colonization surveillance program. It is not intended to diagnose MRSA infection nor to guide or monitor treatment for MRSA infections. Performed at [facility], [place], [place] [address]   Resp Panel by RT-PCR (Flu A&B, Covid) Nasopharyngeal Swab     Status: None   Collection Time: 09/22/20  4:25 PM   Specimen: Nasopharyngeal Swab; Nasopharyngeal(NP) swabs in vial transport medium  Result Value Ref Range Status   SARS Coronavirus 2 by RT PCR NEGATIVE NEGATIVE Final    Comment: (NOTE) SARS-CoV-2 target nucleic acids are NOT DETECTED.  The SARS-CoV-2 RNA is generally detectable in upper respiratory specimens  during the acute phase of infection. The lowest concentration of SARS-CoV-2 viral copies this assay can detect is 138 copies/mL. A negative result does not preclude SARS-Cov-2 infection and should not be used as the sole basis for treatment or other patient management decisions. A negative result may occur with  improper specimen collection/handling, submission of specimen other than nasopharyngeal swab, presence of viral mutation(s) within the areas targeted by this assay, and inadequate number of viral copies(<138 copies/mL). A negative result must be combined with clinical observations, patient history, and epidemiological information. The expected result is Negative.  Fact Sheet for Patients:  EntrepreneurPulse.com.au  Fact Sheet for Healthcare Providers:  IncredibleEmployment.be  This test is no t yet approved or cleared by the [place] FDA and  has been authorized for detection and/or diagnosis of SARS-CoV-2 by FDA under an Emergency Use Authorization (EUA). This EUA will remain  in effect (meaning this test can be used) for the duration of the COVID-19 declaration under Section 564(b)(1) of the Act, 21 U.S.C.section 360bbb-3(b)(1), unless the authorization is terminated  or revoked sooner.       Influenza A by PCR NEGATIVE NEGATIVE Final   Influenza B by PCR NEGATIVE NEGATIVE Final    Comment: (NOTE) The Xpert Xpress SARS-CoV-2/FLU/RSV plus assay is intended as an aid in the diagnosis of influenza from Nasopharyngeal swab specimens and should not be used as a sole basis for treatment. Nasal washings and aspirates are unacceptable for Xpert Xpress SARS-CoV-2/FLU/RSV testing.  Fact Sheet for Patients: EntrepreneurPulse.com.au  Fact Sheet for Healthcare Providers: IncredibleEmployment.be  This test is not yet approved or cleared by the [place] FDA and has been authorized for detection  and/or  diagnosis of SARS-CoV-2 by FDA under an Emergency Use Authorization (EUA). This EUA will remain in effect (meaning this test can be used) for the duration of the COVID-19 declaration under Section 564(b)(1) of the Act, 21 U.S.C. section 360bbb-3(b)(1), unless the authorization is terminated or revoked.  Performed at [facility], [address], [place], [place] [address]          Radiology Studies: No results found.      Scheduled Meds: . chlorhexidine gluconate (MEDLINE KIT)  15 mL Mouth Rinse BID  . Chlorhexidine Gluconate Cloth  6 each Topical Q2200  . divalproex  500 mg Oral Q12H  . enoxaparin (LOVENOX) injection  40 mg Subcutaneous Q24H  . FLUoxetine  60 mg Oral Daily  . insulin aspart  0-15 Units Subcutaneous Q4H  . insulin glargine  6 Units Subcutaneous QHS  . levothyroxine  88 mcg Oral QAC breakfast  . lurasidone  20 mg Oral Daily  . pantoprazole  40 mg Oral Q24H  . thiamine  100 mg Oral Daily  . vitamin B-12  1,000 mcg Oral Daily   Continuous Infusions: . sodium chloride Stopped (09/20/20 0523)     LOS: 8 days    Time spent: 31 mins    Wyvonnia Dusky, MD [facility] Pager [phone]  If 7PM-7AM, please contact night-coverage 09/23/2020, 7:52 AM

## 2020-09-24 DIAGNOSIS — E876 Hypokalemia: Secondary | ICD-10-CM | POA: Diagnosis not present

## 2020-09-24 DIAGNOSIS — R531 Weakness: Secondary | ICD-10-CM | POA: Diagnosis not present

## 2020-09-24 LAB — BASIC METABOLIC PANEL
Anion gap: 10 (ref 5–15)
BUN: 7 mg/dL — ABNORMAL LOW (ref 8–23)
CO2: 25 mmol/L (ref 22–32)
Calcium: 8.6 mg/dL — ABNORMAL LOW (ref 8.9–10.3)
Chloride: 102 mmol/L (ref 98–111)
Creatinine, Ser: 0.74 mg/dL (ref 0.44–1.00)
GFR, Estimated: >60 mL/min (ref >60)
Glucose, Bld: 150 mg/dL — ABNORMAL HIGH (ref 70–99)
Potassium: 3.6 mmol/L (ref 3.5–5.1)
Sodium: 137 mmol/L (ref 135–145)

## 2020-09-24 LAB — CBC
HCT: 37.3 % (ref 36.0–46.0)
Hemoglobin: 12.4 g/dL (ref 12.0–15.0)
MCH: 29.8 pg (ref 26.0–34.0)
MCHC: 33.2 g/dL (ref 30.0–36.0)
MCV: 89.7 fL (ref 80.0–100.0)
Platelets: 263 10*3/uL (ref 150–400)
RBC: 4.16 MIL/uL (ref 3.87–5.11)
RDW: 14.8 % (ref 11.5–15.5)
WBC: 7.8 10*3/uL (ref 4.0–10.5)
nRBC: 0 % (ref 0.0–0.2)

## 2020-09-24 LAB — GLUCOSE, CAPILLARY
Glucose-Capillary: 105 mg/dL — ABNORMAL HIGH (ref 70–99)
Glucose-Capillary: 138 mg/dL — ABNORMAL HIGH (ref 70–99)
Glucose-Capillary: 140 mg/dL — ABNORMAL HIGH (ref 70–99)
Glucose-Capillary: 167 mg/dL — ABNORMAL HIGH (ref 70–99)
Glucose-Capillary: 220 mg/dL — ABNORMAL HIGH (ref 70–99)
Glucose-Capillary: 256 mg/dL — ABNORMAL HIGH (ref 70–99)

## 2020-09-24 NOTE — Plan of Care (Signed)
  Problem: Clinical Measurements: Goal: Ability to maintain clinical measurements within normal limits will improve Outcome: Progressing   

## 2020-09-24 NOTE — Progress Notes (Addendum)
PROGRESS NOTE    [name]  SBB:[id number] DOB: 1946/01/11 DOA: 09/15/2020 PCP: Mechele Claude, FNP   Assessment & Plan:   Principal Problem:   Bipolar 1 disorder with moderate mania ([place]) Active Problems:   Essential hypertension   Hypothyroidism   Dyslipidemia   Type 2 diabetes mellitus with hyperlipidemia ([place])   AKI (acute kidney injury) ([place])   Aspiration pneumonia ([place])   Acute respiratory failure with hypoxia ([place])   Acute hypoxemic respiratory failure ([place])   Acute hypoxic respiratory failure: likely secondary to aspiration. S/p intubation & bronchoscopy. Extubated on 09/17/20. Resolved   Likely aspiration pneumonia: completed abx course. Continue on bronchodilators and encourage incentive spirometry  Hypokalemia: within normal limits today   Leukocytosis: resolved  Hypothyroidism: continue on home dose of levothyroxine   DM2: likely poorly controlled. Continue on lantus, SSI w/ accuchecks   Bipolar disorder: continue on home dose of latuda, depakote, fluoxetine. Haldol prn   Weakness: PT/OT recs SNF. Pt's insurance denied SNF and request peer to peer review and number called on 09/22/20 but unable to reach appropriate doc for peer to peer, CM notified and was given another number for which the company said that peer to peer was closed now despite calling w/in 1 hour of being told there was a requested peer to peer review. CM notified    DVT prophylaxis: lovenox  Code Status: full  Family Communication:  Disposition Plan: likely d/c to SNF.    Level of care: Med-Surg   Status is: Inpatient  Remains inpatient appropriate because:Unsafe d/c plan and Inpatient level of care appropriate due to severity of illness. Pt's insurance denied SNF and request peer to peer review and number called on 09/22/20 but unable to reach appropriate doc for peer to peer, CM notified and was given another number for which the company said that peer to peer was closed now  despite calling w/in 30-35 mins of being told there was a requested peer to peer review. CM notified   Dispo: The patient is from: Home              Anticipated d/c is to: SNF              Patient currently is medically stable to d/c.   Difficult to place patient No     Consultants:   Psych    Procedures:    Antimicrobials   Subjective: Pt c/o malaise    Objective: Vitals:   09/23/20 2045 09/24/20 0032 09/24/20 0512 09/24/20 0736  BP: 124/66 124/65 130/61 139/65  Pulse: 62 (!) 55 62 (!) 57  Resp: _0 Temp: 98.5 F (36.9 C) 98.7 F (37.1 C) 97.9 F (36.6 C) 98.1 F (36.7 C)  TempSrc:      SpO2: 90% 92% (!) 89% 90%  Weight:      Height:        Intake/Output Summary (Last 24 hours) at 09/24/2020 0751 Last data filed at 09/24/2020 0510 Gross per 24 hour  Intake 120 ml  Output 1800 ml  Net -1680 ml   Filed Weights   09/18/20 0500 09/19/20 0500 09/21/20 0500  Weight: 74.3 kg 76.7 kg 78.6 kg    Examination:  General exam: Appears calm and comfortable  Respiratory system: decreased breath sounds b/l  Cardiovascular system:  S1/S2+. No rubs or clicks  Gastrointestinal system: Abd is soft, NT, ND & normal bowel sounds  Central nervous system: Alert and oriented. Moves all extremities  Psychiatry: Judgement and insight appear normal. Flat mood and affect     Data Reviewed: I have personally reviewed following labs and imaging studies  CBC: Recent Labs  Lab 09/18/20 0548 09/19/20 0421 09/20/20 0816 09/21/20 0818 09/22/20 0501 09/23/20 0511 09/24/20 0530  WBC 12.6* 10.7* 8.8 7.9 7.5 7.0 7.8  NEUTROABS 9.8* 8.0*  --   --   --   --   --   HGB 11.7* 11.4* 12.2 12.3 11.8* 11.8* 12.4  HCT 34.5* 33.7* 37.3 36.2 35.3* 35.0* 37.3  MCV 88.5 87.8 89.4 87.7 87.8 90.0 89.7  PLT 161 163 195 200 219 224 993   Basic Metabolic Panel: Recent Labs  Lab 09/18/20 0548 09/19/20 0421 09/20/20 0816 09/21/20 0818 09/22/20 0501 09/23/20 0511 09/24/20 0530   NA 137 137 136 139 137 138 137  K 4.0 3.4* 3.4* 3.2* 3.2* 3.5 3.6  CL 103 102 101 103 103 103 102  CO2 _0 GLUCOSE 136* 127* 102* 89 127* 102* 150*  BUN _1 7* 7*  CREATININE 0.92 0.70 0.66 0.74 0.76 0.75 0.74  CALCIUM 8.3* 8.2* 8.7* 8.6* 8.3* 8.4* 8.6*  MG 1.7 1.8  --   --   --   --   --   PHOS 2.5 2.7  --   --   --   --   --    GFR: Estimated Creatinine Clearance: 60.3 mL/min (by C-G formula based on SCr of 0.74 mg/dL). Liver Function Tests: Recent Labs  Lab 09/18/20 0548 09/19/20 0421  ALBUMIN 2.5* 2.3*   No results for input(s): LIPASE, AMYLASE in the last 168 hours. No results for input(s): AMMONIA in the last 168 hours. Coagulation Profile: No results for input(s): INR, PROTIME in the last 168 hours. Cardiac Enzymes: No results for input(s): CKTOTAL, CKMB, CKMBINDEX, TROPONINI in the last 168 hours. BNP (last 3 results) No results for input(s): PROBNP in the last 8760 hours. HbA1C: No results for input(s): HGBA1C in the last 72 hours. CBG: Recent Labs  Lab 09/23/20 1138 09/23/20 1609 09/23/20 2047 09/24/20 0021 09/24/20 0405  GLUCAP 207* 281* 201* 105* 140*   Lipid Profile: No results for input(s): CHOL, HDL, LDLCALC, TRIG, CHOLHDL, LDLDIRECT in the last 72 hours. Thyroid Function Tests: No results for input(s): TSH, T4TOTAL, FREET4, T3FREE, THYROIDAB in the last 72 hours. Anemia Panel: No results for input(s): VITAMINB12, FOLATE, FERRITIN, TIBC, IRON, RETICCTPCT in the last 72 hours. Sepsis Labs: Recent Labs  Lab 09/18/20 0548  PROCALCITON 1.12    Recent Results (from the past 240 hour(s))  Resp Panel by RT-PCR (Flu A&B, Covid) Nasopharyngeal Swab     Status: None   Collection Time: 09/15/20  8:52 PM   Specimen: Nasopharyngeal Swab; Nasopharyngeal(NP) swabs in vial transport medium  Result Value Ref Range Status   SARS Coronavirus 2 by RT PCR NEGATIVE NEGATIVE Final    Comment: (NOTE) SARS-CoV-2 target nucleic acids are  NOT DETECTED.  The SARS-CoV-2 RNA is generally detectable in upper respiratory specimens during the acute phase of infection. The lowest concentration of SARS-CoV-2 viral copies this assay can detect is 138 copies/mL. A negative result does not preclude SARS-Cov-2 infection and should not be used as the sole basis for treatment or other patient management decisions. A negative result may occur with  improper specimen collection/handling, submission of specimen other than nasopharyngeal swab, presence of viral mutation(s) within the areas targeted by this assay, and inadequate number of viral  copies(<138 copies/mL). A negative result must be combined with clinical observations, patient history, and epidemiological information. The expected result is Negative.  Fact Sheet for Patients:  EntrepreneurPulse.com.au  Fact Sheet for Healthcare Providers:  IncredibleEmployment.be  This test is no t yet approved or cleared by the [place] FDA and  has been authorized for detection and/or diagnosis of SARS-CoV-2 by FDA under an Emergency Use Authorization (EUA). This EUA will remain  in effect (meaning this test can be used) for the duration of the COVID-19 declaration under Section 564(b)(1) of the Act, 21 U.S.C.section 360bbb-3(b)(1), unless the authorization is terminated  or revoked sooner.       Influenza A by PCR NEGATIVE NEGATIVE Final   Influenza B by PCR NEGATIVE NEGATIVE Final    Comment: (NOTE) The Xpert Xpress SARS-CoV-2/FLU/RSV plus assay is intended as an aid in the diagnosis of influenza from Nasopharyngeal swab specimens and should not be used as a sole basis for treatment. Nasal washings and aspirates are unacceptable for Xpert Xpress SARS-CoV-2/FLU/RSV testing.  Fact Sheet for Patients: EntrepreneurPulse.com.au  Fact Sheet for Healthcare Providers: IncredibleEmployment.be  This test is not  yet approved or cleared by the [place] FDA and has been authorized for detection and/or diagnosis of SARS-CoV-2 by FDA under an Emergency Use Authorization (EUA). This EUA will remain in effect (meaning this test can be used) for the duration of the COVID-19 declaration under Section 564(b)(1) of the Act, 21 U.S.C. section 360bbb-3(b)(1), unless the authorization is terminated or revoked.  Performed at [facility], [address] [place], [place] [address]   Culture, blood (Routine X 2) w Reflex to ID Panel     Status: None   Collection Time: 09/16/20  6:08 AM   Specimen: BLOOD  Result Value Ref Range Status   Specimen Description BLOOD RIGHT ANTECUBITAL  Final   Special Requests   Final    BOTTLES DRAWN AEROBIC AND ANAEROBIC Blood Culture adequate volume   Culture   Final    NO GROWTH 5 DAYS Performed at [facility], [address], [place], [place] [address]    Report Status 09/21/2020 FINAL  Final  Culture, blood (Routine X 2) w Reflex to ID Panel     Status: None   Collection Time: 09/16/20  7:24 AM   Specimen: BLOOD  Result Value Ref Range Status   Specimen Description BLOOD BLOOD LEFT HAND  Final   Special Requests   Final    BOTTLES DRAWN AEROBIC AND ANAEROBIC Blood Culture adequate volume   Culture   Final    NO GROWTH 5 DAYS Performed at [facility], [address], [place], [place] [address]    Report Status 09/21/2020 FINAL  Final  Culture, BAL-quantitative w Gram Stain     Status: Abnormal   Collection Time: 09/16/20 10:31 AM   Specimen: Bronchoalveolar Lavage; Respiratory  Result Value Ref Range Status   Specimen Description   Final    BRONCHIAL ALVEOLAR LAVAGE Performed at [facility], [address] [place], [place] [address]    Special Requests   Final    NONE Performed at [facility], [place], [place] [address]    Gram Stain   Final    MODERATE WBC PRESENT,BOTH PMN AND  MONONUCLEAR NO ORGANISMS SEEN    Culture (A)  Final    10,000 COLONIES/mL Normal respiratory flora-no Staph aureus or Pseudomonas seen Performed at [place],  [place] [address]    Report Status 09/18/2020 FINAL  Final  Culture, BAL-quantitative w Gram Stain     Status: Abnormal   Collection Time: 09/16/20 10:31 AM   Specimen: Bronchial Wash; Respiratory  Result Value Ref Range Status   Specimen Description   Final    BRONCHIAL WASHINGS Performed at [facility], [address], [place], [place] [address]    Special Requests   Final    TRACHEAL ASPIRATE Performed at [facility], [address] [place], [place] [address]    Gram Stain   Final    ABUNDANT WBC PRESENT, PREDOMINANTLY PMN RARE GRAM POSITIVE COCCI    Culture (A)  Final    20,000 COLONIES/mL Normal respiratory flora-no Staph aureus or Pseudomonas seen Performed at [facility] [address] [place], [place] [address]    Report Status 09/18/2020 FINAL  Final  MRSA PCR Screening     Status: None   Collection Time: 09/16/20  1:30 PM   Specimen: Nasopharyngeal  Result Value Ref Range Status   MRSA by PCR NEGATIVE NEGATIVE Final    Comment:        The GeneXpert MRSA Assay (FDA approved for NASAL specimens only), is one component of a comprehensive MRSA colonization surveillance program. It is not intended to diagnose MRSA infection nor to guide or monitor treatment for MRSA infections. Performed at [facility], [place],  [address]   Resp Panel by RT-PCR (Flu A&B, Covid) Nasopharyngeal Swab     Status: None   Collection Time: 09/22/20  4:25 PM   Specimen: Nasopharyngeal Swab; Nasopharyngeal(NP) swabs in vial transport medium  Result Value Ref Range Status   SARS Coronavirus 2 by RT PCR NEGATIVE NEGATIVE Final    Comment: (NOTE) SARS-CoV-2 target nucleic acids are NOT DETECTED.  The SARS-CoV-2 RNA is generally  detectable in upper respiratory specimens during the acute phase of infection. The lowest concentration of SARS-CoV-2 viral copies this assay can detect is 138 copies/mL. A negative result does not preclude SARS-Cov-2 infection and should not be used as the sole basis for treatment or other patient management decisions. A negative result may occur with  improper specimen collection/handling, submission of specimen other than nasopharyngeal swab, presence of viral mutation(s) within the areas targeted by this assay, and inadequate number of viral copies(<138 copies/mL). A negative result must be combined with clinical observations, patient history, and epidemiological information. The expected result is Negative.  Fact Sheet for Patients:  EntrepreneurPulse.com.au  Fact Sheet for Healthcare Providers:  IncredibleEmployment.be  This test is no t yet approved or cleared by the [place] FDA and  has been authorized for detection and/or diagnosis of SARS-CoV-2 by FDA under an Emergency Use Authorization (EUA). This EUA will remain  in effect (meaning this test can be used) for the duration of the COVID-19 declaration under Section 564(b)(1) of the Act, 21 U.S.C.section 360bbb-3(b)(1), unless the authorization is terminated  or revoked sooner.       Influenza A by PCR NEGATIVE NEGATIVE Final   Influenza B by PCR NEGATIVE NEGATIVE Final    Comment: (NOTE) The Xpert Xpress SARS-CoV-2/FLU/RSV plus assay is intended as an aid in the diagnosis of influenza from Nasopharyngeal swab specimens and should not be used as a sole basis for treatment. Nasal washings and aspirates are unacceptable for Xpert Xpress SARS-CoV-2/FLU/RSV testing.  Fact Sheet for Patients: EntrepreneurPulse.com.au  Fact Sheet for Healthcare Providers: IncredibleEmployment.be  This test is not yet approved  or cleared by the [place]  and has been authorized for detection and/or diagnosis of SARS-CoV-2 by FDA under an Emergency Use Authorization (EUA). This EUA will remain in effect (meaning this test can be used) for the duration of the COVID-19 declaration under Section 564(b)(1) of the Act, 21 U.S.C. section 360bbb-3(b)(1), unless the authorization is terminated or revoked.  Performed at [facility], [address], [place], [place] [address]          Radiology Studies: No results found.      Scheduled Meds: . chlorhexidine gluconate (MEDLINE KIT)  15 mL Mouth Rinse BID  . Chlorhexidine Gluconate Cloth  6 each Topical Q2200  . divalproex  500 mg Oral Q12H  . enoxaparin (LOVENOX) injection  40 mg Subcutaneous Q24H  . FLUoxetine  60 mg Oral Daily  . insulin aspart  0-15 Units Subcutaneous Q4H  . insulin glargine  6 Units Subcutaneous QHS  . levothyroxine  88 mcg Oral QAC breakfast  . lurasidone  20 mg Oral Daily  . pantoprazole  40 mg Oral Q24H  . thiamine  100 mg Oral Daily  . vitamin B-12  1,000 mcg Oral Daily   Continuous Infusions: . sodium chloride Stopped (09/20/20 0523)     LOS: 9 days    Time spent: 20 mins    Wyvonnia Dusky, MD [facility] Pager [phone]  If 7PM-7AM, please contact night-coverage 09/24/2020, 7:51 AM

## 2020-09-25 DIAGNOSIS — R531 Weakness: Secondary | ICD-10-CM | POA: Diagnosis not present

## 2020-09-25 DIAGNOSIS — E1169 Type 2 diabetes mellitus with other specified complication: Secondary | ICD-10-CM | POA: Diagnosis not present

## 2020-09-25 DIAGNOSIS — E785 Hyperlipidemia, unspecified: Secondary | ICD-10-CM | POA: Diagnosis not present

## 2020-09-25 LAB — GLUCOSE, CAPILLARY
Glucose-Capillary: 120 mg/dL — ABNORMAL HIGH (ref 70–99)
Glucose-Capillary: 130 mg/dL — ABNORMAL HIGH (ref 70–99)
Glucose-Capillary: 166 mg/dL — ABNORMAL HIGH (ref 70–99)
Glucose-Capillary: 169 mg/dL — ABNORMAL HIGH (ref 70–99)
Glucose-Capillary: 265 mg/dL — ABNORMAL HIGH (ref 70–99)
Glucose-Capillary: 92 mg/dL (ref 70–99)

## 2020-09-25 LAB — BASIC METABOLIC PANEL
Anion gap: 8 (ref 5–15)
BUN: 9 mg/dL (ref 8–23)
CO2: 27 mmol/L (ref 22–32)
Calcium: 8.7 mg/dL — ABNORMAL LOW (ref 8.9–10.3)
Chloride: 103 mmol/L (ref 98–111)
Creatinine, Ser: 0.75 mg/dL (ref 0.44–1.00)
GFR, Estimated: >60 mL/min (ref >60)
Glucose, Bld: 107 mg/dL — ABNORMAL HIGH (ref 70–99)
Potassium: 3.5 mmol/L (ref 3.5–5.1)
Sodium: 138 mmol/L (ref 135–145)

## 2020-09-25 LAB — CBC
HCT: 38.6 % (ref 36.0–46.0)
Hemoglobin: 12.7 g/dL (ref 12.0–15.0)
MCH: 29.5 pg (ref 26.0–34.0)
MCHC: 32.9 g/dL (ref 30.0–36.0)
MCV: 89.8 fL (ref 80.0–100.0)
Platelets: 289 10*3/uL (ref 150–400)
RBC: 4.3 MIL/uL (ref 3.87–5.11)
RDW: 15.1 % (ref 11.5–15.5)
WBC: 9 10*3/uL (ref 4.0–10.5)
nRBC: 0 % (ref 0.0–0.2)

## 2020-09-25 NOTE — TOC Progression Note (Signed)
Transition of Care [facility]) - Progression Note    Patient Details  Name: [name] MRN: [id number] Date of Birth: 01-20-1946  Transition [facility] CM/SW Contact  Eileen Stanford, LCSW Phone Number: 09/25/2020, 3:29 PM  Clinical Narrative:   Resubmitted for auth with updated PT note.    Expected Discharge Plan: Long Term Nursing Home Barriers to Discharge: Continued Medical Work up  Expected Discharge Plan and Services Expected Discharge Plan: [place] In-house Referral: Clinical Social Work   Post Acute Care Choice: Nursing Home ([place]) Living arrangements for the past 2 months: [place]                                       Social Determinants of Health (SDOH) Interventions    Readmission Risk Interventions Readmission Risk Prevention Plan 08/24/2020  PCP or Specialist Appt within 5-7 Days Complete  Medication Review (RN CM) Complete  Some recent data might be hidden

## 2020-09-25 NOTE — Plan of Care (Signed)
  Problem: Activity: Goal: Ability to tolerate increased activity will improve Outcome: Progressing   Problem: Respiratory: Goal: Ability to maintain adequate ventilation will improve Outcome: Progressing   Problem: Education: Goal: Knowledge of General Education information will improve Description: Including pain rating scale, medication(s)/side effects and non-pharmacologic comfort measures Outcome: Progressing   Problem: Clinical Measurements: Goal: Ability to maintain clinical measurements within normal limits will improve Outcome: Progressing   Problem: Safety: Goal: Ability to remain free from injury will improve Outcome: Progressing

## 2020-09-25 NOTE — Progress Notes (Addendum)
PROGRESS NOTE    [name]  WUJ:[id number] DOB: 01-Jan-1946 DOA: 09/15/2020 PCP: Mechele Claude, FNP   Assessment & Plan:   Principal Problem:   Bipolar 1 disorder with moderate mania ([place]) Active Problems:   Essential hypertension   Hypothyroidism   Dyslipidemia   Type 2 diabetes mellitus with hyperlipidemia ([place])   AKI (acute kidney injury) ([place])   Aspiration pneumonia ([place])   Acute respiratory failure with hypoxia ([place])   Acute hypoxemic respiratory failure ([place])   Acute hypoxic respiratory failure: likely secondary to aspiration. S/p intubation & bronchoscopy. Extubated on 09/17/20. Resolved   Likely aspiration pneumonia: completed abx course. Continue on bronchodilators and encourage incentive spirometry  Hypokalemia: WNL today   Leukocytosis: resolved  Hypothyroidism: continue on home dose of levothyroxine   DM2: likely poorly controlled. Continue on lantus, SSI w/ accuchecks    Bipolar disorder: continue on home dose of latuda, depakote, fluoxetine. Haldol prn   Weakness: PT/OT recs SNF. Pt's insurance denied SNF and request peer to peer review and number called on 09/22/20 but unable to reach appropriate doc for peer to peer, CM notified and was given another number for which the company said that peer to peer was closed now despite calling w/in 1 hour of being told there was a requested peer to peer review. CM notified    DVT prophylaxis: lovenox  Code Status: full  Family Communication:  Disposition Plan: likely d/c to SNF.    Level of care: Med-Surg   Status is: Inpatient  Remains inpatient appropriate because:Unsafe d/c plan and Inpatient level of care appropriate due to severity of illness. Pt's insurance denied SNF and request peer to peer review and number called on 09/22/20 but unable to reach appropriate doc for peer to peer, CM notified and was given another number for which the company said that peer to peer was closed now despite calling  w/in 30-35 mins of being told there was a requested peer to peer review. CM notified   Dispo: The patient is from: Home              Anticipated d/c is to: SNF              Patient currently is medically stable to d/c.   Difficult to place patient No     Consultants:   Psych    Procedures:    Antimicrobials   Subjective: Pt c/o fatigue   Objective: Vitals:   09/24/20 0736 09/24/20 1626 09/24/20 2017 09/25/20 0500  BP: 139/65 105/68 102/61 115/61  Pulse: (!) 57 64 73 70  Resp: _0 Temp: 98.1 F (36.7 C) 97.9 F (36.6 C) 98.2 F (36.8 C) 98.1 F (36.7 C)  TempSrc:   Oral   SpO2: 90% (!) 88% 91% 90%  Weight:      Height:        Intake/Output Summary (Last 24 hours) at 09/25/2020 0728 Last data filed at 09/24/2020 1858 Gross per 24 hour  Intake 240 ml  Output --  Net 240 ml   Filed Weights   09/18/20 0500 09/19/20 0500 09/21/20 0500  Weight: 74.3 kg 76.7 kg 78.6 kg    Examination:  General exam: Appears comfortable  Respiratory system: diminished breath sounds b/l. No wheezes, rales Cardiovascular system:  S1 & S2+. No gallops or rubs  Gastrointestinal system: Abd is soft, NT, ND & normal bowel sounds  Central nervous system: Alert and awake. Moves all extremities Psychiatry: Judgement and insight  appear normal. Flat mood and affect    Data Reviewed: I have personally reviewed following labs and imaging studies  CBC: Recent Labs  Lab 09/19/20 0421 09/20/20 0816 09/21/20 0818 09/22/20 0501 09/23/20 0511 09/24/20 0530 09/25/20 0548  WBC 10.7*   < > 7.9 7.5 7.0 7.8 9.0  NEUTROABS 8.0*  --   --   --   --   --   --   HGB 11.4*   < > 12.3 11.8* 11.8* 12.4 12.7  HCT 33.7*   < > 36.2 35.3* 35.0* 37.3 38.6  MCV 87.8   < > 87.7 87.8 90.0 89.7 89.8  PLT 163   < > 200 219 224 263 289   < > = values in this interval not displayed.   Basic Metabolic Panel: Recent Labs  Lab 09/19/20 0421 09/20/20 0816 09/21/20 0818 09/22/20 0501  09/23/20 0511 09/24/20 0530 09/25/20 0548  NA 137   < > 139 137 138 137 138  K 3.4*   < > 3.2* 3.2* 3.5 3.6 3.5  CL 102   < > 103 103 103 102 103  CO2 27   < > _0 GLUCOSE 127*   < > 89 127* 102* 150* 107*  BUN 12   < > 8 8 7* 7* 9  CREATININE 0.70   < > 0.74 0.76 0.75 0.74 0.75  CALCIUM 8.2*   < > 8.6* 8.3* 8.4* 8.6* 8.7*  MG 1.8  --   --   --   --   --   --   PHOS 2.7  --   --   --   --   --   --    < > = values in this interval not displayed.   GFR: Estimated Creatinine Clearance: 60.3 mL/min (by C-G formula based on SCr of 0.75 mg/dL). Liver Function Tests: Recent Labs  Lab 09/19/20 0421  ALBUMIN 2.3*   No results for input(s): LIPASE, AMYLASE in the last 168 hours. No results for input(s): AMMONIA in the last 168 hours. Coagulation Profile: No results for input(s): INR, PROTIME in the last 168 hours. Cardiac Enzymes: No results for input(s): CKTOTAL, CKMB, CKMBINDEX, TROPONINI in the last 168 hours. BNP (last 3 results) No results for input(s): PROBNP in the last 8760 hours. HbA1C: No results for input(s): HGBA1C in the last 72 hours. CBG: Recent Labs  Lab 09/24/20 1133 09/24/20 1627 09/24/20 2242 09/25/20 0050 09/25/20 0455  GLUCAP 220* 138* 256* 130* 92   Lipid Profile: No results for input(s): CHOL, HDL, LDLCALC, TRIG, CHOLHDL, LDLDIRECT in the last 72 hours. Thyroid Function Tests: No results for input(s): TSH, T4TOTAL, FREET4, T3FREE, THYROIDAB in the last 72 hours. Anemia Panel: No results for input(s): VITAMINB12, FOLATE, FERRITIN, TIBC, IRON, RETICCTPCT in the last 72 hours. Sepsis Labs: No results for input(s): PROCALCITON, LATICACIDVEN in the last 168 hours.  Recent Results (from the past 240 hour(s))  Resp Panel by RT-PCR (Flu A&B, Covid) Nasopharyngeal Swab     Status: None   Collection Time: 09/15/20  8:52 PM   Specimen: Nasopharyngeal Swab; Nasopharyngeal(NP) swabs in vial transport medium  Result Value Ref Range Status   SARS  Coronavirus 2 by RT PCR NEGATIVE NEGATIVE Final    Comment: (NOTE) SARS-CoV-2 target nucleic acids are NOT DETECTED.  The SARS-CoV-2 RNA is generally detectable in upper respiratory specimens during the acute phase of infection. The lowest concentration of SARS-CoV-2 viral copies this assay can detect is  138 copies/mL. A negative result does not preclude SARS-Cov-2 infection and should not be used as the sole basis for treatment or other patient management decisions. A negative result may occur with  improper specimen collection/handling, submission of specimen other than nasopharyngeal swab, presence of viral mutation(s) within the areas targeted by this assay, and inadequate number of viral copies(<138 copies/mL). A negative result must be combined with clinical observations, patient history, and epidemiological information. The expected result is Negative.  Fact Sheet for Patients:  EntrepreneurPulse.com.au  Fact Sheet for Healthcare Providers:  IncredibleEmployment.be  This test is no t yet approved or cleared by the [place] FDA and  has been authorized for detection and/or diagnosis of SARS-CoV-2 by FDA under an Emergency Use Authorization (EUA). This EUA will remain  in effect (meaning this test can be used) for the duration of the COVID-19 declaration under Section 564(b)(1) of the Act, 21 U.S.C.section 360bbb-3(b)(1), unless the authorization is terminated  or revoked sooner.       Influenza A by PCR NEGATIVE NEGATIVE Final   Influenza B by PCR NEGATIVE NEGATIVE Final    Comment: (NOTE) The Xpert Xpress SARS-CoV-2/FLU/RSV plus assay is intended as an aid in the diagnosis of influenza from Nasopharyngeal swab specimens and should not be used as a sole basis for treatment. Nasal washings and aspirates are unacceptable for Xpert Xpress SARS-CoV-2/FLU/RSV testing.  Fact Sheet for  Patients: EntrepreneurPulse.com.au  Fact Sheet for Healthcare Providers: IncredibleEmployment.be  This test is not yet approved or cleared by the [place] FDA and has been authorized for detection and/or diagnosis of SARS-CoV-2 by FDA under an Emergency Use Authorization (EUA). This EUA will remain in effect (meaning this test can be used) for the duration of the COVID-19 declaration under Section 564(b)(1) of the Act, 21 U.S.C. section 360bbb-3(b)(1), unless the authorization is terminated or revoked.  Performed at [facility], [address] [place], [place] [address]   Culture, blood (Routine X 2) w Reflex to ID Panel     Status: None   Collection Time: 09/16/20  6:08 AM   Specimen: BLOOD  Result Value Ref Range Status   Specimen Description BLOOD RIGHT ANTECUBITAL  Final   Special Requests   Final    BOTTLES DRAWN AEROBIC AND ANAEROBIC Blood Culture adequate volume   Culture   Final    NO GROWTH 5 DAYS Performed at [facility], [address] [place], [place] [address]    Report Status 09/21/2020 FINAL  Final  Culture, blood (Routine X 2) w Reflex to ID Panel     Status: None   Collection Time: 09/16/20  7:24 AM   Specimen: BLOOD  Result Value Ref Range Status   Specimen Description BLOOD BLOOD LEFT HAND  Final   Special Requests   Final    BOTTLES DRAWN AEROBIC AND ANAEROBIC Blood Culture adequate volume   Culture   Final    NO GROWTH 5 DAYS Performed at [facility], [address], [place], [place] [address]    Report Status 09/21/2020 FINAL  Final  Culture, BAL-quantitative w Gram Stain     Status: Abnormal   Collection Time: 09/16/20 10:31 AM   Specimen: Bronchoalveolar Lavage; Respiratory  Result Value Ref Range Status   Specimen Description   Final    BRONCHIAL ALVEOLAR LAVAGE Performed at [facility], [address], [place], [place] [address]    Special Requests   Final     NONE Performed at [facility], [address] [place], [place] [address]    Gram Stain   Final    MODERATE WBC PRESENT,BOTH PMN AND MONONUCLEAR NO ORGANISMS SEEN    Culture (A)  Final    10,000 COLONIES/mL Normal respiratory flora-no Staph aureus or Pseudomonas seen Performed at [facility] [address] [place], [place] [address]    Report Status 09/18/2020 FINAL  Final  Culture, BAL-quantitative w Gram Stain     Status: Abnormal   Collection Time: 09/16/20 10:31 AM   Specimen: Bronchial Wash; Respiratory  Result Value Ref Range Status   Specimen Description   Final    BRONCHIAL WASHINGS Performed at [facility], [address], [place], [place] [address]    Special Requests   Final    TRACHEAL ASPIRATE Performed at [facility], [address] [place], [place] [address]    Gram Stain   Final    ABUNDANT WBC PRESENT, PREDOMINANTLY PMN RARE GRAM POSITIVE COCCI    Culture (A)  Final    20,000 COLONIES/mL Normal respiratory flora-no Staph aureus or Pseudomonas seen Performed at [facility] [address], [place], [place] [address]    Report Status 09/18/2020 FINAL  Final  MRSA PCR Screening     Status: None   Collection Time: 09/16/20  1:30 PM   Specimen: Nasopharyngeal  Result Value Ref Range Status   MRSA by PCR NEGATIVE NEGATIVE Final    Comment:        The GeneXpert MRSA Assay (FDA approved for NASAL specimens only), is one component of a comprehensive MRSA colonization surveillance program. It is not intended to diagnose MRSA infection nor to guide or monitor treatment for MRSA infections. Performed at [facility], [place], [place] [address]   Resp Panel by RT-PCR (Flu A&B, Covid) Nasopharyngeal Swab     Status: None   Collection Time: 09/22/20  4:25 PM   Specimen: Nasopharyngeal Swab; Nasopharyngeal(NP) swabs in vial transport medium  Result Value Ref Range Status   SARS  Coronavirus 2 by RT PCR NEGATIVE NEGATIVE Final    Comment: (NOTE) SARS-CoV-2 target nucleic acids are NOT DETECTED.  The SARS-CoV-2 RNA is generally detectable in upper respiratory specimens during the acute phase of infection. The lowest concentration of SARS-CoV-2 viral copies this assay can detect is 138 copies/mL. A negative result does not preclude SARS-Cov-2 infection and should not be used as the sole basis for treatment or other patient management decisions. A negative result may occur with  improper specimen collection/handling, submission of specimen other than nasopharyngeal swab, presence of viral mutation(s) within the areas targeted by this assay, and inadequate number of viral copies(<138 copies/mL). A negative result must be combined with clinical observations, patient history, and epidemiological information. The expected result is Negative.  Fact Sheet for Patients:  EntrepreneurPulse.com.au  Fact Sheet for Healthcare Providers:  IncredibleEmployment.be  This test is no t yet approved or cleared by the [place] FDA and  has been authorized for detection and/or diagnosis of SARS-CoV-2 by FDA under an Emergency Use Authorization (EUA). This EUA will remain  in effect (meaning this test can be used) for the duration of the COVID-19 declaration under Section 564(b)(1) of the Act, 21 U.S.C.section 360bbb-3(b)(1), unless the authorization is terminated  or revoked sooner.       Influenza A by PCR NEGATIVE NEGATIVE Final   Influenza B by PCR NEGATIVE NEGATIVE Final    Comment: (NOTE) The Xpert Xpress  SARS-CoV-2/FLU/RSV plus assay is intended as an aid in the diagnosis of influenza from Nasopharyngeal swab specimens and should not be used as a sole basis for treatment. Nasal washings and aspirates are unacceptable for Xpert Xpress SARS-CoV-2/FLU/RSV testing.  Fact Sheet for  Patients: EntrepreneurPulse.com.au  Fact Sheet for Healthcare Providers: IncredibleEmployment.be  This test is not yet approved or cleared by the [place] FDA and has been authorized for detection and/or diagnosis of SARS-CoV-2 by FDA under an Emergency Use Authorization (EUA). This EUA will remain in effect (meaning this test can be used) for the duration of the COVID-19 declaration under Section 564(b)(1) of the Act, 21 U.S.C. section 360bbb-3(b)(1), unless the authorization is terminated or revoked.  Performed at [facility], [address] [place], [place] [address]          Radiology Studies: No results found.      Scheduled Meds: . chlorhexidine gluconate (MEDLINE KIT)  15 mL Mouth Rinse BID  . Chlorhexidine Gluconate Cloth  6 each Topical Q2200  . divalproex  500 mg Oral Q12H  . enoxaparin (LOVENOX) injection  40 mg Subcutaneous Q24H  . FLUoxetine  60 mg Oral Daily  . insulin aspart  0-15 Units Subcutaneous Q4H  . insulin glargine  6 Units Subcutaneous QHS  . levothyroxine  88 mcg Oral QAC breakfast  . lurasidone  20 mg Oral Daily  . pantoprazole  40 mg Oral Q24H  . thiamine  100 mg Oral Daily  . vitamin B-12  1,000 mcg Oral Daily   Continuous Infusions: . sodium chloride Stopped (09/20/20 0523)     LOS: 10 days    Time spent: 21 mins    Wyvonnia Dusky, MD [facility] Pager [phone]  If 7PM-7AM, please contact night-coverage 09/25/2020, 7:28 AM

## 2020-09-25 NOTE — Plan of Care (Signed)
  Problem: Activity: Goal: Ability to tolerate increased activity will improve Outcome: Progressing   Problem: Clinical Measurements: Goal: Ability to maintain a body temperature in the normal range will improve Outcome: Progressing   Problem: Respiratory: Goal: Ability to maintain adequate ventilation will improve Outcome: Progressing Goal: Ability to maintain a clear airway will improve Outcome: Progressing   Problem: Education: Goal: Knowledge of General Education information will improve Description: Including pain rating scale, medication(s)/side effects and non-pharmacologic comfort measures Outcome: Progressing   Problem: Health Behavior/Discharge Planning: Goal: Ability to manage health-related needs will improve Outcome: Progressing   Problem: Clinical Measurements: Goal: Ability to maintain clinical measurements within normal limits will improve Outcome: Progressing Goal: Diagnostic test results will improve Outcome: Progressing Goal: Respiratory complications will improve Outcome: Progressing Goal: Cardiovascular complication will be avoided Outcome: Progressing   Problem: Activity: Goal: Risk for activity intolerance will decrease Outcome: Progressing   Problem: Elimination: Goal: Will not experience complications related to bowel motility Outcome: Progressing Goal: Will not experience complications related to urinary retention Outcome: Progressing   Problem: Safety: Goal: Ability to remain free from injury will improve Outcome: Progressing   Problem: Skin Integrity: Goal: Risk for impaired skin integrity will decrease Outcome: Progressing

## 2020-09-25 NOTE — Progress Notes (Signed)
Physical Therapy Treatment Patient Details Name: [name] MRN: [id number] DOB: [birth year] Today's Date: 09/25/2020    History of Present Illness Pt is a 75 y.o. female with medical history significant for type 2 diabetes mellitus, essential hypertension, acquired hypothyroidism, who is admitted to [facility] on 09/15/2020 with acute hypoxic respiratory failure.  MD assessment includes: Acute hypoxic respiratory failure secondary to aspiration with food removed from the lungs on vent on extubated, hypothyroidism, depression on numerous psychiatric medications, hypomagnesemia, and weakness.    PT Comments    Pt was pleasant and motivated to participate during the session and made some progress towards goals this session.  Pt required grossly less physical assistance with bed mobility and transfers this session compared to prior sessions.  Pt continued to present with L lateral and posterior lean in sitting but responded well to weight shifting activities and by the end of the session the pt was SBA with static sitting balance.  Pt also continued to present with posterior instability in standing but grossly improved and was able to take several very small, shuffling steps at the EOB. Pt will benefit from PT services in a SNF setting upon discharge to safely address deficits listed in patient problem list for decreased caregiver assistance and eventual return to PLOF.     Follow Up Recommendations  SNF;Supervision/Assistance - 24 hour     Equipment Recommendations  Other (comment) (TBD)    Recommendations for Other Services       Precautions / Restrictions Precautions Precautions: Fall Restrictions Weight Bearing Restrictions: No    Mobility  Bed Mobility Overal bed mobility: Needs Assistance Bed Mobility: Supine to Sit;Sit to Supine     Supine to sit: Max assist Sit to supine: Max assist   General bed mobility comments: Improved effort from patient  but continued to require extensive physical assist, down from 2 person assist to 1 person this session    Transfers Overall transfer level: Needs assistance Equipment used: Rolling walker (2 wheeled) Transfers: Sit to/from Stand Sit to Stand: Mod assist         General transfer comment: Max verbal and tactile cues for BLE foot placement and increased trunk flexion; posterior and R lateral lean in sitting  Ambulation/Gait Ambulation/Gait assistance: Mod assist Gait Distance (Feet): 1 Feet Assistive device: Rolling walker (2 wheeled) Gait Pattern/deviations: Step-to pattern;Shuffle Gait velocity: decreased   General Gait Details: Max verbal and tactile cues for anterior weight shifting to prevent posterior instability; min to mod A to prevent posterior LOB; pt only able to take several small, shuffling steps at the EOB   Stairs             Wheelchair Mobility    Modified Rankin (Stroke Patients Only)       Balance Overall balance assessment: Needs assistance   Sitting balance-Leahy Scale: Poor Sitting balance - Comments: Improved from prior session but pt continued to require occasional assist to prevent R lateral and posterior LOB Postural control: Posterior lean;Right lateral lean Standing balance support: Bilateral upper extremity supported;During functional activity Standing balance-Leahy Scale: Poor Standing balance comment: Min to mod A to prevent posterior LOB in standing                            Cognition Arousal/Alertness: Awake/alert Behavior During Therapy: WFL for tasks assessed/performed Overall Cognitive Status: No family/caregiver present to determine baseline cognitive functioning  Exercises Total Joint Exercises Ankle Circles/Pumps: AROM;Strengthening;Both;5 reps;10 reps Quad Sets: Strengthening;Both;5 reps;10 reps Gluteal Sets: Strengthening;Both;5 reps;10 reps Hip  ABduction/ADduction: AAROM;Both;10 reps;Strengthening Straight Leg Raises: 10 reps;Both;AAROM;Strengthening Long Arc Quad: AAROM;Both;10 reps;Strengthening;15 reps Knee Flexion: 10 reps;Both;AAROM;Strengthening;15 reps Other Exercises Other Exercises: Extensive L lateral and anterior weight shifting in sitting to address R lateral and posterior lean Other Exercises: Anterior weight shifting in standing to address posterior instability Other Exercises: Multiple sit to/from stand transfer training Other Exercises: Static standing to patient's tolerance 3 x 2-3 minutes    General Comments        Pertinent Vitals/Pain Pain Assessment: No/denies pain    Home Living                      Prior Function            PT Goals (current goals can now be found in the care plan section) Progress towards PT goals: Progressing toward goals    Frequency    Min 2X/week      PT Plan Current plan remains appropriate    Co-evaluation              AM-PAC PT "6 Clicks" Mobility   Outcome Measure  Help needed turning from your back to your side while in a flat bed without using bedrails?: A Lot Help needed moving from lying on your back to sitting on the side of a flat bed without using bedrails?: A Lot Help needed moving to and from a bed to a chair (including a wheelchair)?: A Lot Help needed standing up from a chair using your arms (e.g., wheelchair or bedside chair)?: A Lot Help needed to walk in hospital room?: Total Help needed climbing 3-5 steps with a railing? : Total 6 Click Score: 10    End of Session Equipment Utilized During Treatment: Gait belt Activity Tolerance: Patient tolerated treatment well Patient left: in bed;with call bell/phone within reach;with bed alarm set Nurse Communication: Mobility status PT Visit Diagnosis: Difficulty in walking, not elsewhere classified (R26.2);Muscle weakness (generalized) (M62.81)     Time: 1322-1400 PT Time  Calculation (min) (ACUTE ONLY): 38 min  Charges:  $Therapeutic Exercise: 23-37 mins $Therapeutic Activity: 8-22 mins                     D. Scott Pietra Zuluaga PT, DPT 09/25/20, 2:07 PM

## 2020-09-26 DIAGNOSIS — E1169 Type 2 diabetes mellitus with other specified complication: Secondary | ICD-10-CM | POA: Diagnosis not present

## 2020-09-26 DIAGNOSIS — E785 Hyperlipidemia, unspecified: Secondary | ICD-10-CM | POA: Diagnosis not present

## 2020-09-26 DIAGNOSIS — R531 Weakness: Secondary | ICD-10-CM | POA: Diagnosis not present

## 2020-09-26 DIAGNOSIS — F3112 Bipolar disorder, current episode manic without psychotic features, moderate: Secondary | ICD-10-CM | POA: Diagnosis not present

## 2020-09-26 LAB — GLUCOSE, CAPILLARY
Glucose-Capillary: 117 mg/dL — ABNORMAL HIGH (ref 70–99)
Glucose-Capillary: 139 mg/dL — ABNORMAL HIGH (ref 70–99)
Glucose-Capillary: 165 mg/dL — ABNORMAL HIGH (ref 70–99)
Glucose-Capillary: 195 mg/dL — ABNORMAL HIGH (ref 70–99)
Glucose-Capillary: 205 mg/dL — ABNORMAL HIGH (ref 70–99)
Glucose-Capillary: 99 mg/dL (ref 70–99)

## 2020-09-26 NOTE — TOC Progression Note (Addendum)
Transition of Care [facility]) - Progression Note    Patient Details  Name: [name] MRN: [id number] Date of Birth: 21-Jan-1946  [facility]) CM/SW [place], RN Phone Number: 09/26/2020, 3:02 PM  Clinical Narrative:   Checked with [employer] health insurance, they show it is denied and did not accept the resubmission stating that unless there is a change in condition it is still denied.   Expected Discharge Plan: Long Term Nursing Home Barriers to Discharge: Continued Medical Work up  Expected Discharge Plan and Services Expected Discharge Plan: [place] In-house Referral: Clinical Social Work   Post Acute Care Choice: Nursing Home ([place]) Living arrangements for the past 2 months: [place]                                       Social Determinants of Health (SDOH) Interventions    Readmission Risk Interventions Readmission Risk Prevention Plan 08/24/2020  PCP or Specialist Appt within 5-7 Days Complete  Medication Review (RN CM) Complete  Some recent data might be hidden

## 2020-09-26 NOTE — Progress Notes (Signed)
Physical Therapy Treatment Patient Details Name: [name] MRN: [id number] DOB: 10/02/1945 Today's Date: 09/26/2020    History of Present Illness Pt is a 75 y.o. female with medical history significant for type 2 diabetes mellitus, essential hypertension, acquired hypothyroidism, who is admitted to [facility] on 09/15/2020 with acute hypoxic respiratory failure.  MD assessment includes: Acute hypoxic respiratory failure secondary to aspiration with food removed from the lungs on vent on extubated, hypothyroidism, depression on numerous psychiatric medications, hypomagnesemia, and weakness.    PT Comments    Pt was pleasant and motivated to participate during the session and made good progress towards goals this session.  Pt required grossly less physical assistance with functional tasks and was able to amb 6 feet with min A to address posterior lean. Pt reported no adverse symptoms during the session with SpO2 and HR WNL.  Pt will benefit from PT services in a SNF setting upon discharge to safely address deficits listed in patient problem list for decreased caregiver assistance and eventual return to PLOF.    Follow Up Recommendations  SNF;Supervision/Assistance - 24 hour     Equipment Recommendations  Other (comment) (TBD)    Recommendations for Other Services       Precautions / Restrictions Precautions Precautions: Fall Restrictions Weight Bearing Restrictions: No    Mobility  Bed Mobility Overal bed mobility: Needs Assistance Bed Mobility: Supine to Sit     Supine to sit: Mod assist     General bed mobility comments: Mod A for BLE and trunk control    Transfers Overall transfer level: Needs assistance Equipment used: Rolling walker (2 wheeled) Transfers: Sit to/from Stand Sit to Stand: Mod assist;+2 safety/equipment         General transfer comment: Max verbal and tactile cues for BLE foot placement and increased trunk flexion;  posterior lean in sitting that improved with anterior weight shifting activities  Ambulation/Gait Ambulation/Gait assistance: Min assist Gait Distance (Feet): 8 Feet Assistive device: Rolling walker (2 wheeled) Gait Pattern/deviations: Step-through pattern;Decreased step length - right;Decreased step length - left Gait velocity: decreased   General Gait Details: Max verbal and tactile cues for anterior weight shifting to prevent posterior instability; min A to prevent posterior LOB; pt able to amb with improved stability and clearance when advancing BLEs   Stairs             Wheelchair Mobility    Modified Rankin (Stroke Patients Only)       Balance Overall balance assessment: Needs assistance   Sitting balance-Leahy Scale: Poor Sitting balance - Comments: Improved from prior session but pt continued to require occasional assist to prevent posterior LOB Postural control: Posterior lean Standing balance support: Bilateral upper extremity supported;During functional activity Standing balance-Leahy Scale: Poor Standing balance comment: Min A to prevent posterior LOB in standing                            Cognition Arousal/Alertness: Awake/alert Behavior During Therapy: WFL for tasks assessed/performed Overall Cognitive Status: No family/caregiver present to determine baseline cognitive functioning                                        Exercises Total Joint Exercises Ankle Circles/Pumps: AROM;Strengthening;Both;10 reps Quad Sets: Strengthening;Both;10 reps Gluteal Sets: Strengthening;Both;10 reps Hip ABduction/ADduction: AAROM;Both;10 reps;Strengthening Straight Leg Raises: 10 reps;Both;AAROM;Strengthening Long Arc  Quad: AAROM;Both;10 reps;Strengthening Knee Flexion: 10 reps;Both;AAROM;Strengthening Other Exercises Other Exercises: Extensive anterior weight shifting in sitting and standing to address posterior lean    General Comments         Pertinent Vitals/Pain Pain Assessment: No/denies pain    Home Living                      Prior Function            PT Goals (current goals can now be found in the care plan section) Progress towards PT goals: Progressing toward goals    Frequency    Min 2X/week      PT Plan Current plan remains appropriate    Co-evaluation              AM-PAC PT "6 Clicks" Mobility   Outcome Measure  Help needed turning from your back to your side while in a flat bed without using bedrails?: A Lot Help needed moving from lying on your back to sitting on the side of a flat bed without using bedrails?: A Lot Help needed moving to and from a bed to a chair (including a wheelchair)?: A Lot Help needed standing up from a chair using your arms (e.g., wheelchair or bedside chair)?: A Lot Help needed to walk in hospital room?: A Lot Help needed climbing 3-5 steps with a railing? : Total 6 Click Score: 11    End of Session Equipment Utilized During Treatment: Gait belt Activity Tolerance: Patient tolerated treatment well Patient left: in chair;with call bell/phone within reach;with chair alarm set;with SCD's reapplied Nurse Communication: Mobility status PT Visit Diagnosis: Difficulty in walking, not elsewhere classified (R26.2);Muscle weakness (generalized) (M62.81)     Time: 1751-0258 PT Time Calculation (min) (ACUTE ONLY): 27 min  Charges:  $Gait Training: 8-22 mins $Therapeutic Exercise: 8-22 mins                     D. Scott Sansa Alkema PT, DPT 09/26/20, 5:23 PM

## 2020-09-26 NOTE — Care Management Important Message (Signed)
Important Message  Patient Details  Name: [name] MRN: [id number] Date of Birth: [birth year]   Medicare Important Message Given:  Yes     Juliann Pulse A Rivers Hamrick 09/26/2020, 11:58 AM

## 2020-09-26 NOTE — Progress Notes (Signed)
PROGRESS NOTE   HPI was taken from Dr. Velia Meyer: [name] is a 75 y.o. female with medical history significant for type 2 diabetes mellitus, essential hypertension, acquired hypothyroidism, who is admitted to [facility] on 09/15/2020 with acute hypoxic respiratory failure due to suspected aspiration pneumonia after presenting from SNF to [facility] ED for evaluation of potential aspiration.   Bilirubin this evening, the patient was witnessed by the SNF staff to start choking and coughing on her dinner, which included a hotdog.  The patient reportedly continued a significant continuous coughing for approximately 30 minutes, during which staff noted the patient to cough up several bits of her dinner, including hotdog.  After this timeframe, staff contacted EMS, who noted the patient's initial oxygen saturation to be in the low 80s on room air, prompting initiation of nonrebreather for bringing the patient to [facility] ED for further evaluation.  No known baseline supplemental oxygen requirements, and no known underlying chronic pulmonary pathology.   Per my discussion with the patient, she reports mild shortness of breath and continued coughing.  Denies any associated subjective fever, chills, rigors, or generalized myalgias.  She also denies any chest pains, palpitations, diaphoresis, nausea, vomiting.  Not associate with any wheezing or hemoptysis.  Denies any associated orthopnea, PND, or new onset peripheral edema. It is unclear if the patient has any known underlying history of dysphagia or requirements for modified diet.     ED Course:  Vital signs in the ED were notable for the following: Temperature max 97.5, heart rate 95-96; initial respiratory rate noted to be 24, with subsequent improvement into the range of 14-15; blood pressure 104/72-112/78; supplemental oxygen delivery has been transitioned from nonrebreather to 6 L nasal cannula, upon which the patient  has been maintaining oxygen saturations in the range of 94 to 98%.  Labs were notable for the following: CMP was notable for the following: Sodium 135, bicarbonate 23, BUN 27, creatinine 1.28 relative to most recent prior creatinine data point of 1.03 on 08/24/2020.  CBC notable for white blood cell count of 15,000.  Nasopharyngeal COVID-19/influenza PCR were performed in the ED today, and found to be negative.  Chest x-ray showed no evidence of acute cardiopulmonary process.  The EDP discussed the patient's case with the on-call critical care physician, Dr. Keenan Bachelor, who recommended admission to the hospitalistservice, with plan for critical care service to follow along,with anticipated bronchoscopy tomorrow(09/16/20)along with interval antibiotics for suspected aspiration pneumonia, including specific recommendation for meropenem in the setting of reported penicillin allergy.  While in the ED, the following were administered: Meropenem 1 g IV x1.  Subsequently, the patient was admitted to the PCU for further evaluation and management of presenting acute hypoxic respiratory failure in the setting of suspected aspiration pneumonia.    As per Dr. Leslye Peer: Patient was admitted 09/15/2020 with aspiration of hotdog.  Past medical history of hypothyroidism, type 2 diabetes mellitus, and bipolar disorder.  Her respiratory status rapidly deteriorated where she required BiPAP and changed to stepdown admission.  Her oxygen requirements worsened requiring 100% on the BiPAP.  She was intubated on 09/16/2020 and had a bronchoscopy which removed some food.  She was extubated on 09/17/2020.  She is currently on nasal cannula 4 L.  There was a code stroke called yesterday while she was down at swallow study.  Seen by neurology.  CT and MRI of the brain negative for acute stroke.  Hospital course from Dr. Jimmye Norman 5/4-5/10/22: Pt was found to have  respiratory failure and pneumonia. Pt was weaned from supplemental  oxygen prior to d/c and has completed the abx course from pneumonia. PT/OT recommended SNF but pt's insurance denied it. called on 09/22/20 but unable to reach appropriate doc for peer to peer, CM notified and was given another number for which the company said that peer to peer was closed now despite calling w/in 1 hour of being told there was a requested peer to peer review. CM is aware and working on this     [name]  MVE:[id number] DOB: 10/11/1945 DOA: 09/15/2020 PCP: Mechele Claude, FNP   Assessment & Plan:   Principal Problem:   Bipolar 1 disorder with moderate mania ([place]) Active Problems:   Essential hypertension   Hypothyroidism   Dyslipidemia   Type 2 diabetes mellitus with hyperlipidemia ([place])   AKI (acute kidney injury) ([place])   Aspiration pneumonia ([place])   Acute respiratory failure with hypoxia ([place])   Acute hypoxemic respiratory failure ([place])   Acute hypoxic respiratory failure: likely secondary to aspiration. S/p intubation & bronchoscopy. Extubated on 09/17/20. Resolved   Likely aspiration pneumonia: completed abx course. Continue on bronchodilators and encourage incentive spirometry  Hypokalemia: within normal limits today   Leukocytosis: resolved  Hypothyroidism: continue on home dose of levothyroxine   DM2: likely poorly controlled. Continue on lantus, SSI w/ accuchecks   Bipolar disorder: continue on home dose of fluoxetine, depakote, latuda. Haldol prn   Weakness: PT/OT recs SNF. Pt's insurance denied SNF and request peer to peer review and number called on 09/22/20 but unable to reach appropriate doc for peer to peer, CM notified and was given another number for which the company said that peer to peer was closed now despite calling w/in 1 hour of being told there was a requested peer to peer review. CM is aware   DVT prophylaxis: lovenox  Code Status: full  Family Communication:  Disposition Plan: likely d/c to SNF.    Level of care: Med-Surg    Status is: Inpatient  Remains inpatient appropriate because:Unsafe d/c plan and Inpatient level of care appropriate due to severity of illness. Pt's insurance denied SNF and request peer to peer review and number called on 09/22/20 but unable to reach appropriate doc for peer to peer, CM notified and was given another number for which the company said that peer to peer was closed now despite calling w/in 30-35 mins of being told there was a requested peer to peer review. CM notified   Dispo: The patient is from: Home              Anticipated d/c is to: SNF              Patient currently is medically stable to d/c.   Difficult to place patient No     Consultants:   Psych    Procedures:    Antimicrobials   Subjective: Pt c/o malaise   Objective: Vitals:   09/25/20 0817 09/25/20 1515 09/25/20 1955 09/26/20 0503  BP: 130/81 91/62 (!) 109/56 121/76  Pulse: 73 63 62 (!) 54  Resp: _0 Temp: 98.1 F (36.7 C) 98.1 F (36.7 C) 98.3 F (36.8 C) 97.6 F (36.4 C)  TempSrc:      SpO2: 92% 90% 93% 96%  Weight:      Height:        Intake/Output Summary (Last 24 hours) at 09/26/2020 0720 Last data filed at 09/26/2020 0505 Gross per 24 hour  Intake --  Output 500 ml  Net -500 ml   Filed Weights   09/18/20 0500 09/19/20 0500 09/21/20 0500  Weight: 74.3 kg 76.7 kg 78.6 kg    Examination:  General exam: Appears calm & comfortable  Respiratory system: decreased breath sounds b/l  Cardiovascular system:  S1/S2+. No clicks or rubs Gastrointestinal system: Abd is soft, NT, ND & hypoactive bowel sounds  Central nervous system: Alert and oriented. Moves all extremities  Psychiatry: Judgement and insight appear normal. Flat mood and affect    Data Reviewed: I have personally reviewed following labs and imaging studies  CBC: Recent Labs  Lab 09/21/20 0818 09/22/20 0501 09/23/20 0511 09/24/20 0530 09/25/20 0548  WBC 7.9 7.5 7.0 7.8 9.0  HGB 12.3 11.8* 11.8*  12.4 12.7  HCT 36.2 35.3* 35.0* 37.3 38.6  MCV 87.7 87.8 90.0 89.7 89.8  PLT 200 219 224 263 570   Basic Metabolic Panel: Recent Labs  Lab 09/21/20 0818 09/22/20 0501 09/23/20 0511 09/24/20 0530 09/25/20 0548  NA 139 137 138 137 138  K 3.2* 3.2* 3.5 3.6 3.5  CL 103 103 103 102 103  CO2 _0 GLUCOSE 89 127* 102* 150* 107*  BUN 8 8 7* 7* 9  CREATININE 0.74 0.76 0.75 0.74 0.75  CALCIUM 8.6* 8.3* 8.4* 8.6* 8.7*   GFR: Estimated Creatinine Clearance: 60.3 mL/min (by C-G formula based on SCr of 0.75 mg/dL). Liver Function Tests: No results for input(s): AST, ALT, ALKPHOS, BILITOT, PROT, ALBUMIN in the last 168 hours. No results for input(s): LIPASE, AMYLASE in the last 168 hours. No results for input(s): AMMONIA in the last 168 hours. Coagulation Profile: No results for input(s): INR, PROTIME in the last 168 hours. Cardiac Enzymes: No results for input(s): CKTOTAL, CKMB, CKMBINDEX, TROPONINI in the last 168 hours. BNP (last 3 results) No results for input(s): PROBNP in the last 8760 hours. HbA1C: No results for input(s): HGBA1C in the last 72 hours. CBG: Recent Labs  Lab 09/25/20 1215 09/25/20 1632 09/25/20 2028 09/26/20 0018 09/26/20 0523  GLUCAP 166* 169* 265* 165* 99   Lipid Profile: No results for input(s): CHOL, HDL, LDLCALC, TRIG, CHOLHDL, LDLDIRECT in the last 72 hours. Thyroid Function Tests: No results for input(s): TSH, T4TOTAL, FREET4, T3FREE, THYROIDAB in the last 72 hours. Anemia Panel: No results for input(s): VITAMINB12, FOLATE, FERRITIN, TIBC, IRON, RETICCTPCT in the last 72 hours. Sepsis Labs: No results for input(s): PROCALCITON, LATICACIDVEN in the last 168 hours.  Recent Results (from the past 240 hour(s))  Culture, blood (Routine X 2) w Reflex to ID Panel     Status: None   Collection Time: 09/16/20  7:24 AM   Specimen: BLOOD  Result Value Ref Range Status   Specimen Description BLOOD BLOOD LEFT HAND  Final   Special Requests    Final    BOTTLES DRAWN AEROBIC AND ANAEROBIC Blood Culture adequate volume   Culture   Final    NO GROWTH 5 DAYS Performed at [facility], [address], [place], [place] [address]    Report Status 09/21/2020 FINAL  Final  Culture, BAL-quantitative w Gram Stain     Status: Abnormal   Collection Time: 09/16/20 10:31 AM   Specimen: Bronchoalveolar Lavage; Respiratory  Result Value Ref Range Status   Specimen Description   Final    BRONCHIAL ALVEOLAR LAVAGE Performed at [facility], [address], [place], [place] [address]    Special Requests   Final  NONE Performed at [facility], [place], [place] [address]    Gram Stain   Final    MODERATE WBC PRESENT,BOTH PMN AND MONONUCLEAR NO ORGANISMS SEEN    Culture (A)  Final    10,000 COLONIES/mL Normal respiratory flora-no Staph aureus or Pseudomonas seen Performed at [place] [address], [place], [place] [address]    Report Status 09/18/2020 FINAL  Final  Culture, BAL-quantitative w Gram Stain     Status: Abnormal   Collection Time: 09/16/20 10:31 AM   Specimen: Bronchial Wash; Respiratory  Result Value Ref Range Status   Specimen Description   Final    BRONCHIAL WASHINGS Performed at [facility], [address] [place], [place] [address]    Special Requests   Final    TRACHEAL ASPIRATE Performed at [facility], [address] [place], [place] [address]    Gram Stain   Final    ABUNDANT WBC PRESENT, PREDOMINANTLY PMN RARE GRAM POSITIVE COCCI    Culture (A)  Final    20,000 COLONIES/mL Normal respiratory flora-no Staph aureus or Pseudomonas seen Performed at [place] [address] [place],  [address]    Report Status 09/18/2020 FINAL  Final  MRSA PCR Screening     Status: None   Collection Time: 09/16/20  1:30 PM   Specimen: Nasopharyngeal  Result Value Ref Range Status   MRSA by PCR NEGATIVE NEGATIVE Final     Comment:        The GeneXpert MRSA Assay (FDA approved for NASAL specimens only), is one component of a comprehensive MRSA colonization surveillance program. It is not intended to diagnose MRSA infection nor to guide or monitor treatment for MRSA infections. Performed at [facility], [place],  [address]   Resp Panel by RT-PCR (Flu A&B, Covid) Nasopharyngeal Swab     Status: None   Collection Time: 09/22/20  4:25 PM   Specimen: Nasopharyngeal Swab; Nasopharyngeal(NP) swabs in vial transport medium  Result Value Ref Range Status   SARS Coronavirus 2 by RT PCR NEGATIVE NEGATIVE Final    Comment: (NOTE) SARS-CoV-2 target nucleic acids are NOT DETECTED.  The SARS-CoV-2 RNA is generally detectable in upper respiratory specimens during the acute phase of infection. The lowest concentration of SARS-CoV-2 viral copies this assay can detect is 138 copies/mL. A negative result does not preclude SARS-Cov-2 infection and should not be used as the sole basis for treatment or other patient management decisions. A negative result may occur with  improper specimen collection/handling, submission of specimen other than nasopharyngeal swab, presence of viral mutation(s) within the areas targeted by this assay, and inadequate number of viral copies(<138 copies/mL). A negative result must be combined with clinical observations, patient history, and epidemiological information. The expected result is Negative.  Fact Sheet for Patients:  EntrepreneurPulse.com.au  Fact Sheet for Healthcare Providers:  IncredibleEmployment.be  This test is no t yet approved or cleared by the [place] FDA and  has been authorized for detection and/or diagnosis of SARS-CoV-2 by FDA under an Emergency Use Authorization (EUA). This EUA will remain  in effect (meaning this test can be used) for the duration of the COVID-19 declaration under  Section 564(b)(1) of the Act, 21 U.S.C.section 360bbb-3(b)(1), unless the authorization is terminated  or revoked sooner.       Influenza A by PCR NEGATIVE NEGATIVE Final   Influenza B by PCR NEGATIVE NEGATIVE Final  Comment: (NOTE) The Xpert Xpress SARS-CoV-2/FLU/RSV plus assay is intended as an aid in the diagnosis of influenza from Nasopharyngeal swab specimens and should not be used as a sole basis for treatment. Nasal washings and aspirates are unacceptable for Xpert Xpress SARS-CoV-2/FLU/RSV testing.  Fact Sheet for Patients: EntrepreneurPulse.com.au  Fact Sheet for Healthcare Providers: IncredibleEmployment.be  This test is not yet approved or cleared by the [place] FDA and has been authorized for detection and/or diagnosis of SARS-CoV-2 by FDA under an Emergency Use Authorization (EUA). This EUA will remain in effect (meaning this test can be used) for the duration of the COVID-19 declaration under Section 564(b)(1) of the Act, 21 U.S.C. section 360bbb-3(b)(1), unless the authorization is terminated or revoked.  Performed at [facility] [address], [place], [place] [address]          Radiology Studies: No results found.      Scheduled Meds: . chlorhexidine gluconate (MEDLINE KIT)  15 mL Mouth Rinse BID  . Chlorhexidine Gluconate Cloth  6 each Topical Q2200  . divalproex  500 mg Oral Q12H  . enoxaparin (LOVENOX) injection  40 mg Subcutaneous Q24H  . FLUoxetine  60 mg Oral Daily  . insulin aspart  0-15 Units Subcutaneous Q4H  . insulin glargine  6 Units Subcutaneous QHS  . levothyroxine  88 mcg Oral QAC breakfast  . lurasidone  20 mg Oral Daily  . pantoprazole  40 mg Oral Q24H  . thiamine  100 mg Oral Daily  . vitamin B-12  1,000 mcg Oral Daily   Continuous Infusions: . sodium chloride Stopped (09/20/20 0523)     LOS: 11 days    Time spent: 20 mins    Wyvonnia Dusky, MD [facility] Pager [phone]  If 7PM-7AM, please contact night-coverage 09/26/2020, 7:20 AM

## 2020-09-26 NOTE — TOC Progression Note (Addendum)
Transition of Care [facility]) - Progression Note    Patient Details  Name: [name] MRN: [id number] Date of Birth: 01-Mar-1946  Transition of Care [facility]) CM/SW [place], RN Phone Number: 09/26/2020, 3:44 PM  Clinical Narrative:   Talked with the patient and explained that the insurance denied her going to rehab, She stated understanding She gave p[ermission to call [place] [facility] to verify that she still has an apartment, they stated that she does still have an apartment and this months rent is due, I explained this to the patient and she stated understanding and was grateful, she thought they may have done away with her apartment. Sent information out to [employer] including, [place], [facility], [facility], [facility], [facility], [facility], [place] to request them to accept the patient, [facility] is Out of Network and cant accept,  [facility]  Is unable to accept, awaiting response from the rest, [facility] has [facility] for [facility] only,     Expected Discharge Plan: Long Term Nursing Home Barriers to Discharge: Continued Medical Work up  Expected Discharge Plan and Services Expected Discharge Plan: [place] In-house Referral: Clinical Social Work   Post Acute Care Choice: Nursing Home ([place]) Living arrangements for the past 2 months: [place]                                       Social Determinants of Health (SDOH) Interventions    Readmission Risk Interventions Readmission Risk Prevention Plan 08/24/2020  PCP or Specialist Appt within 5-7 Days Complete  Medication Review (RN CM) Complete  Some recent data might be hidden

## 2020-09-27 DIAGNOSIS — R0602 Shortness of breath: Secondary | ICD-10-CM

## 2020-09-27 DIAGNOSIS — F3112 Bipolar disorder, current episode manic without psychotic features, moderate: Secondary | ICD-10-CM | POA: Diagnosis not present

## 2020-09-27 DIAGNOSIS — N179 Acute kidney failure, unspecified: Secondary | ICD-10-CM | POA: Diagnosis not present

## 2020-09-27 DIAGNOSIS — Z978 Presence of other specified devices: Secondary | ICD-10-CM

## 2020-09-27 DIAGNOSIS — J9601 Acute respiratory failure with hypoxia: Secondary | ICD-10-CM | POA: Diagnosis not present

## 2020-09-27 LAB — CBC
HCT: 38.6 % (ref 36.0–46.0)
Hemoglobin: 12.7 g/dL (ref 12.0–15.0)
MCH: 29.7 pg (ref 26.0–34.0)
MCHC: 32.9 g/dL (ref 30.0–36.0)
MCV: 90.4 fL (ref 80.0–100.0)
Platelets: 338 10*3/uL (ref 150–400)
RBC: 4.27 MIL/uL (ref 3.87–5.11)
RDW: 15.5 % (ref 11.5–15.5)
WBC: 10.1 10*3/uL (ref 4.0–10.5)
nRBC: 0 % (ref 0.0–0.2)

## 2020-09-27 LAB — GLUCOSE, CAPILLARY
Glucose-Capillary: 107 mg/dL — ABNORMAL HIGH (ref 70–99)
Glucose-Capillary: 131 mg/dL — ABNORMAL HIGH (ref 70–99)
Glucose-Capillary: 151 mg/dL — ABNORMAL HIGH (ref 70–99)
Glucose-Capillary: 185 mg/dL — ABNORMAL HIGH (ref 70–99)
Glucose-Capillary: 205 mg/dL — ABNORMAL HIGH (ref 70–99)
Glucose-Capillary: 257 mg/dL — ABNORMAL HIGH (ref 70–99)
Glucose-Capillary: 61 mg/dL — ABNORMAL LOW (ref 70–99)

## 2020-09-27 LAB — BASIC METABOLIC PANEL
Anion gap: 8 (ref 5–15)
BUN: 9 mg/dL (ref 8–23)
CO2: 27 mmol/L (ref 22–32)
Calcium: 9 mg/dL (ref 8.9–10.3)
Chloride: 103 mmol/L (ref 98–111)
Creatinine, Ser: 0.78 mg/dL (ref 0.44–1.00)
GFR, Estimated: >60 mL/min (ref >60)
Glucose, Bld: 100 mg/dL — ABNORMAL HIGH (ref 70–99)
Potassium: 4.1 mmol/L (ref 3.5–5.1)
Sodium: 138 mmol/L (ref 135–145)

## 2020-09-27 NOTE — TOC Progression Note (Addendum)
Transition of Care [facility]) - Progression Note    Patient Details  Name: [name] MRN: [id number] Date of Birth: [birth year]  Transition of Care [facility]) CM/SW [place], RN Phone Number: 09/27/2020, 1:30 PM  Clinical Narrative:    Buford Dresser at [facility] transportation and cancelled the transport, Notified Dr Manuella Ghazi that the patient is incontinent and unable to stand, she is unable to go home at this time, he will cancel the DC for now, I called [facility] to request them to review for approval even tho it was denied previously, opened a new case with ref number [id number], uploaded updated clinical notes   Expected Discharge Plan: Long Term Nursing Home Barriers to Discharge: Continued Medical Work up  Expected Discharge Plan and Services Expected Discharge Plan: [place] In-house Referral: Clinical Social Work   Post Acute Care Choice: Nursing Home ([place]) Living arrangements for the past 2 months: [place] Expected Discharge Date: 09/27/20                                     Social Determinants of Health (SDOH) Interventions    Readmission Risk Interventions Readmission Risk Prevention Plan 08/24/2020  PCP or Specialist Appt within 5-7 Days Complete  Medication Review (RN CM) Complete  Some recent data might be hidden

## 2020-09-27 NOTE — Progress Notes (Signed)
Palliative: [name] goals are set for returning to her apartment.  She was to be discharged today, but she is unable to stand to dress herself and has become incontinent of bowel.  Transition of care team continues to work with insurance for short-term rehab.  Conference with bedside nursing staff and transition of care team related to patient condition, needs, disposition.  Plan: Continue full scope/full code.  Attempting for short-term rehab.  Outpatient palliative services to follow.  No charge Quinn Axe, NP Palliative medicine team Team phone [phone] Greater than 50% of this time was spent counseling and coordinating care related to the above assessment and plan.

## 2020-09-27 NOTE — Progress Notes (Signed)
Physical Therapy Treatment Patient Details Name: [name] MRN: [id number] DOB: 1946/03/30 Today's Date: 09/27/2020    History of Present Illness Pt is a 75 y.o. female with medical history significant for type 2 diabetes mellitus, essential hypertension, acquired hypothyroidism, who is admitted to [facility] on 09/15/2020 with acute hypoxic respiratory failure.  MD assessment includes: Acute hypoxic respiratory failure secondary to aspiration with food removed from the lungs on vent on extubated, hypothyroidism, depression on numerous psychiatric medications, hypomagnesemia, and weakness.    PT Comments    Pt was pleasant and motivated to participate during the session and put forth good effort throughout.  Pt made some progress towards goals this session including requiring grossly less assistance with bed mobility tasks and transfers as well as presenting with improved static sitting balance.  Pt was able to amb 8 feet with only occasional min A for stability with stability improving after anterior weight shifting activities.  Pt will benefit from PT services in a SNF setting upon discharge to safely address deficits listed in patient problem list for decreased caregiver assistance and eventual return to PLOF.    Follow Up Recommendations  SNF;Supervision/Assistance - 24 hour     Equipment Recommendations  Other (comment) (TBD at next venue of care)    Recommendations for Other Services       Precautions / Restrictions Precautions Precautions: Fall Restrictions Weight Bearing Restrictions: No    Mobility  Bed Mobility Overal bed mobility: Needs Assistance Bed Mobility: Supine to Sit;Sit to Supine     Supine to sit: Min assist Sit to supine: Mod assist   General bed mobility comments: Decreased assistance needed during sup to/from sit this session with very good effort throughout    Transfers Overall transfer level: Needs  assistance Equipment used: Rolling walker (2 wheeled) Transfers: Sit to/from Stand Sit to Stand: Min assist;+2 physical assistance         General transfer comment: Mod verbal and tactile cues for BLE foot placement and increased trunk flexion; no posterior lean in sitting this session  Ambulation/Gait Ambulation/Gait assistance: Min assist Gait Distance (Feet): 8 Feet Assistive device: Rolling walker (2 wheeled) Gait Pattern/deviations: Step-through pattern;Decreased step length - right;Decreased step length - left Gait velocity: decreased   General Gait Details: mod verbal and tactile cues for anterior weight shifting to prevent posterior instability; min A to prevent posterior LOB   Stairs             Wheelchair Mobility    Modified Rankin (Stroke Patients Only)       Balance Overall balance assessment: Needs assistance   Sitting balance-Leahy Scale: Fair     Standing balance support: Bilateral upper extremity supported;During functional activity Standing balance-Leahy Scale: Poor Standing balance comment: Min A to prevent posterior LOB in standing                            Cognition Arousal/Alertness: Awake/alert Behavior During Therapy: WFL for tasks assessed/performed Overall Cognitive Status: No family/caregiver present to determine baseline cognitive functioning                                        Exercises Total Joint Exercises Ankle Circles/Pumps: AROM;Strengthening;Both;10 reps Quad Sets: Strengthening;Both;10 reps Towel Squeeze: Strengthening;Both;10 reps Heel Slides: AROM;Strengthening;Both;5 reps Hip ABduction/ADduction: AAROM;Both;10 reps;Strengthening;AROM Straight Leg Raises: Both;AAROM;Strengthening;5 reps;AROM Long Arc  Quad: AAROM;Both;10 reps;Strengthening Knee Flexion: 10 reps;Both;AAROM;Strengthening Other Exercises Other Exercises: Anterior weight shifting in standing to address posterior lean Other  Exercises: Static standing to patient's tolerance 1 x 3 minutes    General Comments        Pertinent Vitals/Pain Pain Assessment: 0-10 Pain Score: 6  Pain Location: back Pain Descriptors / Indicators: Sore Pain Intervention(s): Monitored during session;Repositioned;Other (comment) (Pt stated did not want pain medication)    Home Living                      Prior Function            PT Goals (current goals can now be found in the care plan section) Progress towards PT goals: Progressing toward goals    Frequency    Min 2X/week      PT Plan Current plan remains appropriate    Co-evaluation              AM-PAC PT "6 Clicks" Mobility   Outcome Measure  Help needed turning from your back to your side while in a flat bed without using bedrails?: A Lot Help needed moving from lying on your back to sitting on the side of a flat bed without using bedrails?: A Lot Help needed moving to and from a bed to a chair (including a wheelchair)?: A Lot Help needed standing up from a chair using your arms (e.g., wheelchair or bedside chair)?: A Lot Help needed to walk in hospital room?: A Lot Help needed climbing 3-5 steps with a railing? : Total 6 Click Score: 11    End of Session Equipment Utilized During Treatment: Gait belt Activity Tolerance: Patient tolerated treatment well Patient left: with call bell/phone within reach;with SCD's reapplied;in bed;with bed alarm set Nurse Communication: Mobility status PT Visit Diagnosis: Difficulty in walking, not elsewhere classified (R26.2);Muscle weakness (generalized) (M62.81)     Time: 9983-3825 PT Time Calculation (min) (ACUTE ONLY): 24 min  Charges:  $Gait Training: 8-22 mins $Therapeutic Exercise: 8-22 mins                     D. Scott Adrion Menz PT, DPT 09/27/20, 5:33 PM

## 2020-09-27 NOTE — Plan of Care (Signed)
  Problem: Activity: Goal: Ability to tolerate increased activity will improve Outcome: Adequate for Discharge   Problem: Clinical Measurements: Goal: Ability to maintain a body temperature in the normal range will improve Outcome: Adequate for Discharge   Problem: Respiratory: Goal: Ability to maintain adequate ventilation will improve Outcome: Adequate for Discharge Goal: Ability to maintain a clear airway will improve Outcome: Adequate for Discharge   Problem: Education: Goal: Knowledge of General Education information will improve Description: Including pain rating scale, medication(s)/side effects and non-pharmacologic comfort measures Outcome: Adequate for Discharge   Problem: Health Behavior/Discharge Planning: Goal: Ability to manage health-related needs will improve Outcome: Adequate for Discharge   Problem: Clinical Measurements: Goal: Ability to maintain clinical measurements within normal limits will improve Outcome: Adequate for Discharge Goal: Diagnostic test results will improve Outcome: Adequate for Discharge Goal: Respiratory complications will improve Outcome: Adequate for Discharge Goal: Cardiovascular complication will be avoided Outcome: Adequate for Discharge   Problem: Activity: Goal: Risk for activity intolerance will decrease Outcome: Adequate for Discharge   Problem: Elimination: Goal: Will not experience complications related to bowel motility Outcome: Adequate for Discharge Goal: Will not experience complications related to urinary retention Outcome: Adequate for Discharge   Problem: Safety: Goal: Ability to remain free from injury will improve Outcome: Adequate for Discharge   Problem: Skin Integrity: Goal: Risk for impaired skin integrity will decrease Outcome: Adequate for Discharge

## 2020-09-27 NOTE — Plan of Care (Signed)
  Problem: Activity: Goal: Ability to tolerate increased activity will improve 09/27/2020 1309 by Jules Schick, RN Outcome: Completed/Met 09/27/2020 0946 by Jules Schick, RN Outcome: Adequate for Discharge   Problem: Clinical Measurements: Goal: Ability to maintain a body temperature in the normal range will improve 09/27/2020 1309 by Jules Schick, RN Outcome: Completed/Met 09/27/2020 0946 by Jules Schick, RN Outcome: Adequate for Discharge   Problem: Respiratory: Goal: Ability to maintain adequate ventilation will improve 09/27/2020 1309 by Jules Schick, RN Outcome: Completed/Met 09/27/2020 0946 by Jules Schick, RN Outcome: Adequate for Discharge Goal: Ability to maintain a clear airway will improve 09/27/2020 1309 by Jules Schick, RN Outcome: Completed/Met 09/27/2020 0946 by Jules Schick, RN Outcome: Adequate for Discharge   Problem: Education: Goal: Knowledge of General Education information will improve Description: Including pain rating scale, medication(s)/side effects and non-pharmacologic comfort measures 09/27/2020 1309 by Jules Schick, RN Outcome: Completed/Met 09/27/2020 0946 by Jules Schick, RN Outcome: Adequate for Discharge   Problem: Health Behavior/Discharge Planning: Goal: Ability to manage health-related needs will improve 09/27/2020 1309 by Jules Schick, RN Outcome: Completed/Met 09/27/2020 0946 by Jules Schick, RN Outcome: Adequate for Discharge   Problem: Clinical Measurements: Goal: Ability to maintain clinical measurements within normal limits will improve 09/27/2020 1309 by Jules Schick, RN Outcome: Completed/Met 09/27/2020 0946 by Jules Schick, RN Outcome: Adequate for Discharge Goal: Diagnostic test results will improve 09/27/2020 1309 by Jules Schick, RN Outcome: Completed/Met 09/27/2020 0946 by Jules Schick, RN Outcome: Adequate for Discharge Goal: Respiratory complications  will improve 09/27/2020 1309 by Jules Schick, RN Outcome: Completed/Met 09/27/2020 0946 by Jules Schick, RN Outcome: Adequate for Discharge Goal: Cardiovascular complication will be avoided 09/27/2020 1309 by Jules Schick, RN Outcome: Completed/Met 09/27/2020 0946 by Jules Schick, RN Outcome: Adequate for Discharge   Problem: Activity: Goal: Risk for activity intolerance will decrease 09/27/2020 1309 by Jules Schick, RN Outcome: Completed/Met 09/27/2020 0946 by Jules Schick, RN Outcome: Adequate for Discharge   Problem: Elimination: Goal: Will not experience complications related to bowel motility 09/27/2020 1309 by Jules Schick, RN Outcome: Completed/Met 09/27/2020 0946 by Jules Schick, RN Outcome: Adequate for Discharge Goal: Will not experience complications related to urinary retention 09/27/2020 1309 by Jules Schick, RN Outcome: Completed/Met 09/27/2020 0946 by Jules Schick, RN Outcome: Adequate for Discharge   Problem: Safety: Goal: Ability to remain free from injury will improve 09/27/2020 1309 by Jules Schick, RN Outcome: Completed/Met 09/27/2020 0946 by Jules Schick, RN Outcome: Adequate for Discharge   Problem: Skin Integrity: Goal: Risk for impaired skin integrity will decrease 09/27/2020 1309 by Jules Schick, RN Outcome: Completed/Met 09/27/2020 0946 by Jules Schick, RN Outcome: Adequate for Discharge

## 2020-09-27 NOTE — TOC Progression Note (Addendum)
Transition of Care [facility]) - Progression Note    Patient Details  Name: [name] MRN: [id number] Date of Birth: 09/22/1945  Transition of Care [facility]) CM/SW [place], RN Phone Number: 09/27/2020, 11:56 AM  Clinical Narrative:    Met with the patient in the room, she is alert and oriented X4 She stated that she will need clothing to go home, Will provide for her, size 16/18 pants/shorts, shoes 9 and shirt X large, She will need a RW and 3 in 1, I notified [employer] Suanne Marker, it will be brought to the room prior to DC, she signed the waiver for [facility] transport to go to her apartment, I faxed it to [phone], Outpatient palliative is set up, she was provided with the [employer] resources booklet and mental health resources,  one everything in place will arrange for transport   Expected Discharge Plan: [place] Barriers to Discharge: Continued Medical Work up  Expected Discharge Plan and Services Expected Discharge Plan: [place] In-house Referral: Clinical Social Work   Post Acute Care Choice: Nursing Home ([place]) Living arrangements for the past 2 months: [place] Expected Discharge Date: 09/27/20                                     Social Determinants of Health (SDOH) Interventions    Readmission Risk Interventions Readmission Risk Prevention Plan 08/24/2020  PCP or Specialist Appt within 5-7 Days Complete  Medication Review (RN CM) Complete  Some recent data might be hidden

## 2020-09-27 NOTE — Progress Notes (Signed)
[facility]) Hospital Liaison RN note:  Received new referral for [facility] out patient palliative program to follow post discharge from Dr. Manuella Ghazi. Belia Heman, TOC is aware. Referral has patient information and [facility] Liaison will follow for disposition.  Thank you for this referral.  Zandra Abts, RN [facility] Liaison  [phone]

## 2020-09-27 NOTE — Progress Notes (Signed)
PROGRESS NOTE   HPI was taken from Dr. Velia Meyer: [name] is a 75 y.o. female with medical history significant for type 2 diabetes mellitus, essential hypertension, acquired hypothyroidism, who is admitted to [facility] on 09/15/2020 with acute hypoxic respiratory failure due to suspected aspiration pneumonia after presenting from SNF to [facility] ED for evaluation of potential aspiration.   Bilirubin this evening, the patient was witnessed by the SNF staff to start choking and coughing on her dinner, which included a hotdog.  The patient reportedly continued a significant continuous coughing for approximately 30 minutes, during which staff noted the patient to cough up several bits of her dinner, including hotdog.  After this timeframe, staff contacted EMS, who noted the patient's initial oxygen saturation to be in the low 80s on room air, prompting initiation of nonrebreather for bringing the patient to [facility] ED for further evaluation.  No known baseline supplemental oxygen requirements, and no known underlying chronic pulmonary pathology.   Per my discussion with the patient, she reports mild shortness of breath and continued coughing.  Denies any associated subjective fever, chills, rigors, or generalized myalgias.  She also denies any chest pains, palpitations, diaphoresis, nausea, vomiting.  Not associate with any wheezing or hemoptysis.  Denies any associated orthopnea, PND, or new onset peripheral edema. It is unclear if the patient has any known underlying history of dysphagia or requirements for modified diet.     ED Course:  Vital signs in the ED were notable for the following: Temperature max 97.5, heart rate 95-96; initial respiratory rate noted to be 24, with subsequent improvement into the range of 14-15; blood pressure 104/72-112/78; supplemental oxygen delivery has been transitioned from nonrebreather to 6 L nasal cannula, upon which the patient  has been maintaining oxygen saturations in the range of 94 to 98%.  Labs were notable for the following: CMP was notable for the following: Sodium 135, bicarbonate 23, BUN 27, creatinine 1.28 relative to most recent prior creatinine data point of 1.03 on 08/24/2020.  CBC notable for white blood cell count of 15,000.  Nasopharyngeal COVID-19/influenza PCR were performed in the ED today, and found to be negative.  Chest x-ray showed no evidence of acute cardiopulmonary process.  The EDP discussed the patient's case with the on-call critical care physician, Dr. Keenan Bachelor, who recommended admission to the hospitalistservice, with plan for critical care service to follow along,with anticipated bronchoscopy tomorrow(09/16/20)along with interval antibiotics for suspected aspiration pneumonia, including specific recommendation for meropenem in the setting of reported penicillin allergy.  While in the ED, the following were administered: Meropenem 1 g IV x1.  Subsequently, the patient was admitted to the PCU for further evaluation and management of presenting acute hypoxic respiratory failure in the setting of suspected aspiration pneumonia.    As per Dr. Leslye Peer: Patient was admitted 09/15/2020 with aspiration of hotdog.  Past medical history of hypothyroidism, type 2 diabetes mellitus, and bipolar disorder.  Her respiratory status rapidly deteriorated where she required BiPAP and changed to stepdown admission.  Her oxygen requirements worsened requiring 100% on the BiPAP.  She was intubated on 09/16/2020 and had a bronchoscopy which removed some food.  She was extubated on 09/17/2020.  She is currently on nasal cannula 4 L.  There was a code stroke called yesterday while she was down at swallow study.  Seen by neurology.  CT and MRI of the brain negative for acute stroke.  Hospital course from Dr. Jimmye Norman 5/4-5/10/22: Pt was found to have  respiratory failure and pneumonia. Pt was weaned from supplemental  oxygen prior to d/c and has completed the abx course from pneumonia. PT/OT recommended SNF but pt's insurance denied it. called on 09/22/20 but unable to reach appropriate doc for peer to peer, CM notified and was given another number for which the company said that peer to peer was closed now despite calling w/in 1 hour of being told there was a requested peer to peer review. CM is aware and working on this     [name]  TDS:[id number] DOB: Jun 24, 1945 DOA: 09/15/2020 PCP: Mechele Claude, FNP   Assessment & Plan:   Principal Problem:   Bipolar 1 disorder with moderate mania ([place]) Active Problems:   Essential hypertension   Hypothyroidism   Dyslipidemia   Type 2 diabetes mellitus with hyperlipidemia ([place])   AKI (acute kidney injury) ([place])   Aspiration pneumonia ([place])   Acute respiratory failure with hypoxia (HCC)   Acute hypoxemic respiratory failure (HCC)   SOB (shortness of breath)   Endotracheally intubated   Acute hypoxic respiratory failure: likely secondary to aspiration. S/p intubation & bronchoscopy. Extubated on 09/17/20. Resolved   Likely aspiration pneumonia: completed abx course. Continue on bronchodilators and encourage incentive spirometry  Hypokalemia: within normal limits  Leukocytosis: resolved  Hypothyroidism: continue on home dose of levothyroxine   DM2: likely poorly controlled. Continue on lantus, SSI w/ accuchecks   Bipolar disorder: continue on home dose of fluoxetine, depakote, latuda. Haldol prn   Weakness: PT/OT recs SNF. Pt's insurance denied SNF and request peer to peer review and number called on 09/22/20 but unable to reach appropriate doc for peer to peer, CM notified and was given another number for which the company said that peer to peer was closed now despite calling w/in 1 hour of being told there was a requested peer to peer review. CM is aware. Will ask PT for reeval  - I tried to D/Ced her home today as insurance keep declining SNF  - patient can't even stand up to get dressed and has incontinence of bowel. D/w TOC and this doesn't seem safe D/C. TOC will work with insurance to see if we can consider SNF     DVT prophylaxis: lovenox  Code Status: full  Family Communication:  Disposition Plan: likely d/c to SNF.    Level of care: Med-Surg   Status is: Inpatient  Remains inpatient appropriate because:Unsafe d/c plan and Inpatient level of care appropriate due to severity of illness. Pt's insurance denied SNF and request peer to peer review and number called on 09/22/20 but unable to reach appropriate doc for peer to peer, CM notified and was given another number for which the company said that peer to peer was closed now despite calling w/in 30-35 mins of being told there was a requested peer to peer review.    Dispo: The patient is from: Home              Anticipated d/c is to: SNF              Patient currently is not medically stable to d/c.   Difficult to place patient Yes   Consultants:   Psych    Procedures:    Antimicrobials   Subjective: Pt very weak and unable to stand up to even get dressed  Objective: Vitals:   09/26/20 1955 09/27/20 0358 09/27/20 0731 09/27/20 1148  BP: 117/73 (!) 107/52 138/63 108/60  Pulse: 71 (!) 59 62 62  Resp: _0 Temp: 98.1 F (36.7 C) 97.7 F (36.5 C) 98.1 F (36.7 C) 98.9 F (37.2 C)  TempSrc: Oral Oral    SpO2: 96% 95% 98% 90%  Weight:  78.8 kg    Height:        Intake/Output Summary (Last 24 hours) at 09/27/2020 1329 Last data filed at 09/27/2020 0403 Gross per 24 hour  Intake 120 ml  Output 450 ml  Net -330 ml   Filed Weights   09/19/20 0500 09/21/20 0500 09/27/20 0358  Weight: 76.7 kg 78.6 kg 78.8 kg    Examination:  General exam: Appears calm & comfortable  Respiratory system: decreased breath sounds b/l  Cardiovascular system:  S1/S2+. No clicks or rubs Gastrointestinal system: Abd is soft, NT, ND & hypoactive bowel sounds   Central nervous system: Alert and oriented. Moves all extremities  Psychiatry: Judgement and insight appear normal. Flat mood and affect    Data Reviewed: I have personally reviewed following labs and imaging studies  CBC: Recent Labs  Lab 09/22/20 0501 09/23/20 0511 09/24/20 0530 09/25/20 0548 09/27/20 0555  WBC 7.5 7.0 7.8 9.0 10.1  HGB 11.8* 11.8* 12.4 12.7 12.7  HCT 35.3* 35.0* 37.3 38.6 38.6  MCV 87.8 90.0 89.7 89.8 90.4  PLT 219 224 263 289 496   Basic Metabolic Panel: Recent Labs  Lab 09/22/20 0501 09/23/20 0511 09/24/20 0530 09/25/20 0548 09/27/20 0555  NA 137 138 137 138 138  K 3.2* 3.5 3.6 3.5 4.1  CL 103 103 102 103 103  CO2 _1 GLUCOSE 127* 102* 150* 107* 100*  BUN 8 7* 7* 9 9  CREATININE 0.76 0.75 0.74 0.75 0.78  CALCIUM 8.3* 8.4* 8.6* 8.7* 9.0   GFR: Estimated Creatinine Clearance: 60.4 mL/min (by C-G formula based on SCr of 0.78 mg/dL). Liver Function Tests: No results for input(s): AST, ALT, ALKPHOS, BILITOT, PROT, ALBUMIN in the last 168 hours. No results for input(s): LIPASE, AMYLASE in the last 168 hours. No results for input(s): AMMONIA in the last 168 hours. Coagulation Profile: No results for input(s): INR, PROTIME in the last 168 hours. Cardiac Enzymes: No results for input(s): CKTOTAL, CKMB, CKMBINDEX, TROPONINI in the last 168 hours. BNP (last 3 results) No results for input(s): PROBNP in the last 8760 hours. HbA1C: No results for input(s): HGBA1C in the last 72 hours. CBG: Recent Labs  Lab 09/27/20 0011 09/27/20 0400 09/27/20 0641 09/27/20 0848 09/27/20 1203  GLUCAP 257* 61* 107* 151* 131*   Lipid Profile: No results for input(s): CHOL, HDL, LDLCALC, TRIG, CHOLHDL, LDLDIRECT in the last 72 hours. Thyroid Function Tests: No results for input(s): TSH, T4TOTAL, FREET4, T3FREE, THYROIDAB in the last 72 hours. Anemia Panel: No results for input(s): VITAMINB12, FOLATE, FERRITIN, TIBC, IRON, RETICCTPCT in the last 72  hours. Sepsis Labs: No results for input(s): PROCALCITON, LATICACIDVEN in the last 168 hours.  Recent Results (from the past 240 hour(s))  Resp Panel by RT-PCR (Flu A&B, Covid) Nasopharyngeal Swab     Status: None   Collection Time: 09/22/20  4:25 PM   Specimen: Nasopharyngeal Swab; Nasopharyngeal(NP) swabs in vial transport medium  Result Value Ref Range Status   SARS Coronavirus 2 by RT PCR NEGATIVE NEGATIVE Final    Comment: (NOTE) SARS-CoV-2 target nucleic acids are NOT DETECTED.  The SARS-CoV-2 RNA is generally detectable in upper respiratory specimens during the acute phase of infection. The lowest concentration of SARS-CoV-2 viral copies this assay can detect is  138 copies/mL. A negative result does not preclude SARS-Cov-2 infection and should not be used as the sole basis for treatment or other patient management decisions. A negative result may occur with  improper specimen collection/handling, submission of specimen other than nasopharyngeal swab, presence of viral mutation(s) within the areas targeted by this assay, and inadequate number of viral copies(<138 copies/mL). A negative result must be combined with clinical observations, patient history, and epidemiological information. The expected result is Negative.  Fact Sheet for Patients:  EntrepreneurPulse.com.au  Fact Sheet for Healthcare Providers:  IncredibleEmployment.be  This test is no t yet approved or cleared by the [place] FDA and  has been authorized for detection and/or diagnosis of SARS-CoV-2 by FDA under an Emergency Use Authorization (EUA). This EUA will remain  in effect (meaning this test can be used) for the duration of the COVID-19 declaration under Section 564(b)(1) of the Act, 21 U.S.C.section 360bbb-3(b)(1), unless the authorization is terminated  or revoked sooner.       Influenza A by PCR NEGATIVE NEGATIVE Final   Influenza B by PCR NEGATIVE  NEGATIVE Final    Comment: (NOTE) The Xpert Xpress SARS-CoV-2/FLU/RSV plus assay is intended as an aid in the diagnosis of influenza from Nasopharyngeal swab specimens and should not be used as a sole basis for treatment. Nasal washings and aspirates are unacceptable for Xpert Xpress SARS-CoV-2/FLU/RSV testing.  Fact Sheet for Patients: EntrepreneurPulse.com.au  Fact Sheet for Healthcare Providers: IncredibleEmployment.be  This test is not yet approved or cleared by the [place] FDA and has been authorized for detection and/or diagnosis of SARS-CoV-2 by FDA under an Emergency Use Authorization (EUA). This EUA will remain in effect (meaning this test can be used) for the duration of the COVID-19 declaration under Section 564(b)(1) of the Act, 21 U.S.C. section 360bbb-3(b)(1), unless the authorization is terminated or revoked.  Performed at [facility], [address] [place], [place] [address]          Radiology Studies: No results found.      Scheduled Meds: . chlorhexidine gluconate (MEDLINE KIT)  15 mL Mouth Rinse BID  . Chlorhexidine Gluconate Cloth  6 each Topical Q2200  . divalproex  500 mg Oral Q12H  . enoxaparin (LOVENOX) injection  40 mg Subcutaneous Q24H  . FLUoxetine  60 mg Oral Daily  . insulin aspart  0-15 Units Subcutaneous Q4H  . insulin glargine  6 Units Subcutaneous QHS  . levothyroxine  88 mcg Oral QAC breakfast  . lurasidone  20 mg Oral Daily  . pantoprazole  40 mg Oral Q24H  . thiamine  100 mg Oral Daily  . vitamin B-12  1,000 mcg Oral Daily   Continuous Infusions:    LOS: 12 days    Time spent: 20 mins    Max Sane, MD [facility] Pager [phone]  If 7PM-7AM, please contact night-coverage 09/27/2020, 1:29 PM

## 2020-09-27 NOTE — Progress Notes (Signed)
Inpatient Diabetes Program Recommendations  AACE/ADA: New Consensus Statement on Inpatient Glycemic Control   Target Ranges:  Prepandial:   less than 140 mg/dL      Peak postprandial:   less than 180 mg/dL (1-2 hours)      Critically ill patients:  140 - 180 mg/dL   Results for [name], [name] (MRN [id number]) as of 09/27/2020 10:03  Ref. Range 09/26/2020 08:03 09/26/2020 11:13 09/26/2020 16:36 09/26/2020 19:57 09/27/2020 00:11 09/27/2020 04:00 09/27/2020 06:41 09/27/2020 08:48  Glucose-Capillary Latest Ref Range: 70 - 99 mg/dL 117 (H) 205 (H)  Novolog 5 units 139 (H) 195 (H)  Novolog 3 units  Lantus 6 units 257 (H)  Novolog 8 units 61 (L) 107 (H) 151 (H)  Novolog 3 units   Review of Glycemic Control  Diabetes history: DM2 Outpatient Diabetes medications: Lantus 15 units daily, Tradjenta 5 mg daily, Metformin 1000 mg BID Current orders for Inpatient glycemic control: Lantus 6 units QHS, Novolog 0-15 units Q4H  Inpatient Diabetes Program Recommendations:    Insulin: Anticipate hypoglycemia today (CBG 61 at 4am) was due to getting Novolog 8 units at 00:30 today.  If patient is eating, please consider changing frequency of Novolog 0-15 units to AC&HS.   Thanks, Barnie Alderman, RN, MSN, CDE Diabetes Coordinator Inpatient Diabetes Program [phone] (Team Pager from 8am to 5pm)

## 2020-09-27 NOTE — Progress Notes (Signed)
Upon d/c home, pt found to be covered in stool, unaware it had happened. Attempted to help pt get cleaned up, however pt was unable to even stand up with 2 person assist. Pt not safe to d/c home by herself at this time; MD and case management notified, dispo plan pending at this time.

## 2020-09-27 NOTE — TOC Progression Note (Signed)
Transition of Care [facility]) - Progression Note    Patient Details  Name: [name] MRN: [id number] Date of Birth: 06-13-1945  [facility]) CM/SW [place], RN Phone Number: 09/27/2020, 12:56 PM  Clinical Narrative:   Called [facility] spoke to [place] and requested door to door service to go home, Faxed waiver to [phone], Notified them that the patient has a RW and a 3 in 1 to transport as well,  Notified the nurse that the patient will be picked up and the driver will call the nurses desk at [phone]  when they arrive.    Expected Discharge Plan: Long Term Nursing Home Barriers to Discharge: Continued Medical Work up  Expected Discharge Plan and Services Expected Discharge Plan: [place] In-house Referral: Clinical Social Work   Post Acute Care Choice: Nursing Home ([place]) Living arrangements for the past 2 months: [place] Expected Discharge Date: 09/27/20                                     Social Determinants of Health (SDOH) Interventions    Readmission Risk Interventions Readmission Risk Prevention Plan 08/24/2020  PCP or Specialist Appt within 5-7 Days Complete  Medication Review (RN CM) Complete  Some recent data might be hidden

## 2020-09-27 NOTE — Discharge Instructions (Signed)
Bipolar 1 Disorder  Bipolar 1 disorder is a mental health disorder in which a person has episodes of emotional highs, or mania, and may also have episodes of lows, or depression. Bipolar 1 disorder is different from other bipolar disorders in that it involves extreme episodes of mania (manic episodes). These episodes last at least one week or involve symptoms that are so severe that hospitalization is needed to keep the person safe.  What are the causes?  The cause of this condition is not known.  What increases the risk?  The following factors may make you more likely to develop this condition:  · Having a family member with the disorder.  · Having an imbalance of certain chemicals in the brain (neurotransmitters).  · Experiencing stress, such as illness, financial problems, or a death.  · Having certain conditions that affect the brain or spinal cord (neurologic conditions).  · Having had a brain injury (trauma).  What are the signs or symptoms?  Symptoms of mania include:  · Very high self-esteem or self-confidence.  · Decreased need for sleep.  · Unusual talkativeness. Speech may be very fast.  · Racing thoughts with quick shifts between topics that may or may not be related (flight of ideas).  · Ability to concentrate either greatly improved or decreased.  · Increased purposeful activity, such as work, studies, or social activity.  · Increased agitation. This could be pacing, squirming, fidgeting, or finger and toe tapping.  · Impulsive behavior and poor judgment. This may result in high-risk activities that are sexual, financial, or physical.  Symptoms of depression include:  · Extreme degrees of sadness, uncontrollable crying, hopelessness, worthlessness, or numbness.  · Sleep problems, such as insomnia, waking early, or sleeping too much.  · No longer enjoying things you used to enjoy.  · Isolation. You may often spend time alone.  · Lack of energy or motivation, and moving more slowly than  normal.  · Trouble making decisions.  · Increased appetite or loss of appetite.  · Thoughts of death, or wanting to harm yourself.  Sometimes, you may have a mixed mood. This means having symptoms of mania and depression at the same time. Stress can often trigger these symptoms.  How is this diagnosed?  This condition may be diagnosed based on:  · Emotional episodes.  · Medical history.  · Use of alcohol, drugs, and prescription medicines. Certain medical conditions and substances can cause symptoms that seem like bipolar disorder. This is called secondary bipolar disorder.  Your health care provider may ask you to take a short test. This helps to understand your symptoms. You may also be asked to see a mental health specialist to follow up on this diagnosis and start treatment.  How is this treated?         This condition is a long-term (chronic) illness. It is often managed with ongoing treatment rather than treatment only when symptoms occur. A combination of treatments is the main approach. Treatment may include:  · Medicine. Medicine can be prescribed by a health care provider who specializes in treating mental health disorders (psychiatrist). Medicines called mood stabilizers are usually prescribed. If symptoms occur during treatment with a mood stabilizer, other medicines may be added.  · Psychotherapy. Some forms of talk therapy, such as cognitive behavioral therapy (CBT) and family therapy, can help with learning to manage bipolar disorder.  · Psychoeducation. This helps you and others understand how this disorder is managed. Include friends and family in   educational sessions so they learn how best to support you.  · Methods of managing your condition, such as journaling or relaxation exercises. Relaxation exercises include:  ? Yoga.  ? Meditation.  ? Deep breathing.  · Lifestyle changes, such as:  ? Limiting alcohol and drug use.  ? Exercising regularly.  ? Structuring when you go to bed and get  up.  ? Eating a healthy diet.  · Electroconvulsive therapy (ECT). This is a procedure in which electricity is applied to the brain through the scalp. It may be used in cases of severe bipolar disorder when medicine and psychotherapy work too slowly or do not work.  Follow these instructions at home:  Activity  · Return to your normal activities as told by your health care provider.  · Find activities that you enjoy, and make time to do them.  · Exercise regularly as told by your health care provider.  Lifestyle    · Follow a set schedule for eating and sleeping.  · Eat a healthy diet that includes fresh fruits and vegetables, whole grains, low-fat dairy, and lean meat.  · Get at least 7-8 hours of sleep each night.  · Avoid using products that contain nicotine or tobacco. If you want help quitting, ask your health care provider.  · Do not use drugs.  Alcohol use  · Do not drink alcohol if:  ? Your health care provider tells you not to drink.  ? You are pregnant, may be pregnant, or are planning to become pregnant.  · If you drink alcohol:  ? Limit how much you use to:  § 0-1 drink a day for women.  § 0-2 drinks a day for men.  ? Be aware of how much alcohol is in your drink. In the [place] one drink equals one 12 oz bottle of beer (355 mL), one 5 oz glass of wine (148 mL), or one 1½ oz glass of hard liquor (44 mL).  General instructions  · Take over-the-counter and prescription medicines only as told by your health care provider. You may think about stopping your medicine, but it is very important to take all your medicine as prescribed.  · Consider joining a support group. Your health care provider may be able to recommend one.  · Talk with your family and friends about your treatment goals and how they can help.  · Keep all follow-up visits as told by your health care provider. This is important.  Where to find more information  · National Alliance on Mental Illness: www.nami.org  · National Institute of Mental  Health: www.nimh.nih.gov  Contact a health care provider if:  · Your symptoms get worse, or your loved ones tell you that your symptoms are getting worse.  · You have uncomfortable side effects from your medicine.  · You have trouble sleeping.  · You have trouble doing daily activities.  · You feel unsafe in your surroundings.  · You are self-medicating with alcohol or drugs.  Get help right away if:  · You have new symptoms.  · You have thoughts about harming yourself or others.  · You are considering suicide.  If you ever feel like you may hurt yourself or others, or have thoughts about taking your own life, get help right away. You can go to your nearest emergency department or call:  · Your local emergency services (911 in the [place]  · A suicide crisis helpline, such as the National Suicide Prevention Lifeline at 1-800-273-8255.   This is open 24 hours a day.  Summary  · Bipolar 1 disorder is a lifelong mental health disorder in which a person has episodes of mania and depression.  · This disorder is mainly treated with a combination of medicines, talk and behavioral therapies, and, often, electroconvulsive therapy (ECT).  · Include friends and family in educational sessions so they know how best to support you.  · Get help right away if you are considering suicide.  This information is not intended to replace advice given to you by your health care provider. Make sure you discuss any questions you have with your health care provider.  Document Revised: 10/20/2018 Document Reviewed: 10/20/2018  Elsevier Patient Education © 2021 [employer] Inc.

## 2020-09-27 NOTE — Plan of Care (Signed)
  Problem: Health Behavior/Discharge Planning: Goal: Ability to manage health-related needs will improve Outcome: Progressing   

## 2020-09-28 DIAGNOSIS — N179 Acute kidney failure, unspecified: Secondary | ICD-10-CM | POA: Diagnosis not present

## 2020-09-28 DIAGNOSIS — J9601 Acute respiratory failure with hypoxia: Secondary | ICD-10-CM | POA: Diagnosis not present

## 2020-09-28 DIAGNOSIS — F3112 Bipolar disorder, current episode manic without psychotic features, moderate: Secondary | ICD-10-CM | POA: Diagnosis not present

## 2020-09-28 LAB — GLUCOSE, CAPILLARY
Glucose-Capillary: 113 mg/dL — ABNORMAL HIGH (ref 70–99)
Glucose-Capillary: 124 mg/dL — ABNORMAL HIGH (ref 70–99)
Glucose-Capillary: 143 mg/dL — ABNORMAL HIGH (ref 70–99)
Glucose-Capillary: 147 mg/dL — ABNORMAL HIGH (ref 70–99)
Glucose-Capillary: 176 mg/dL — ABNORMAL HIGH (ref 70–99)
Glucose-Capillary: 182 mg/dL — ABNORMAL HIGH (ref 70–99)
Glucose-Capillary: 94 mg/dL (ref 70–99)

## 2020-09-28 NOTE — Evaluation (Addendum)
Occupational Therapy Evaluation Patient Details Name: [name] MRN: [id number] DOB: [birth year] Today's Date: 09/28/2020    History of Present Illness Pt is a 75 y.o. female with medical history significant for type 2 diabetes mellitus, essential hypertension, acquired hypothyroidism, who is admitted to [facility] on 09/15/2020 with acute hypoxic respiratory failure. Extubated 09/17/2020.  MD assessment includes: Acute hypoxic respiratory failure secondary to aspiration with food removed from the lungs on vent on extubated, hypothyroidism, depression on numerous psychiatric medications, hypomagnesemia, and weakness.   Clinical Impression   Pt seen for OT evaluation this date in setting of acute hospitalization d/t respiratory failure. Pt presents this date with decreased fxl activity tolerance, weakness (R>L), and general deconditioning, impacting her ability to safely/efficiently perform ADLs/ADL mobility. Pt reports being amb with RW in her home over a month ago (before previous hospitalization/hospital stay). Pt reports that she used to have aide help for bathing, but was inconsistent and ended before last hospital stay. This date, pt requires OT then engages pt in LB dressing task seated EOB with MOD/MAX A. Pt requires MIN/MOD A for ADL transfer with RW to [facility] and then to recliner. Pt left in chair with breakfast tray and chair alarm. All needs met and in reach. Will continue to follow acutely. Anticipate pt will require STR in SNF setting upon d/c from acute setting. Of note, while pt progressed with transfers during OT Session to MIN/MOD assist, nursing staff report that pt required 2 person assistance to transfer back from the chair and stated she just "felt weak" and didn't know why she could no longer stand. This was within ~20 minutes of OT Session ending. So pt's performance could wax/wane. Will need more treatment session data to determine.    Follow Up  Recommendations  SNF    Equipment Recommendations  3 in 1 bedside commode    Recommendations for Other Services       Precautions / Restrictions Precautions Precautions: Fall Restrictions Weight Bearing Restrictions: No      Mobility Bed Mobility Overal bed mobility: Needs Assistance Bed Mobility: Supine to Sit     Supine to sit: Min assist;HOB elevated     General bed mobility comments: increased time    Transfers Overall transfer level: Needs assistance Equipment used: Rolling walker (2 wheeled) Transfers: Sit to/from [facility] Sit to Stand: Min guard;From elevated surface Stand pivot transfers: Min assist;Mod assist       General transfer comment: cues for safe use of RW. Pt requires increased processing time for safe sequencing of all steps of using RW.    Balance Overall balance assessment: Needs assistance   Sitting balance-Leahy Scale: Good Sitting balance - Comments: G static sitting w/o UE support. F/P dynamic sitting balnace   Standing balance support: Bilateral upper extremity supported;During functional activity Standing balance-Leahy Scale: Poor Standing balance comment: B UE support on RW and MIN A to prevent posterior LOB.                           ADL either performed or assessed with clinical judgement   ADL Overall ADL's : Needs assistance/impaired                                       General ADL Comments: requires SETUP to MIN A for seated UB ADLs, MOD/MAX A for seated  LB ADLs. MAX A for standing posterior peri care after BM. MIN A for STS with RW from elevated surface, MIN/MOD A for STS and SPS from lower surface such as recliner with RW.     Vision Baseline Vision/History: Wears glasses Wears Glasses: At all times Patient Visual Report: No change from baseline       Perception     Praxis      Pertinent Vitals/Pain Pain Assessment: 0-10 Pain Score: 4  Pain Location: back Pain  Descriptors / Indicators: Sore Pain Intervention(s): Limited activity within patient's tolerance;Monitored during session;Repositioned     Hand Dominance Right   Extremity/Trunk Assessment Upper Extremity Assessment Upper Extremity Assessment: Overall WFL for tasks assessed;Generalized weakness;RUE deficits/detail;LUE deficits/detail RUE Deficits / Details: R grip slightly weaker than L with MMT grossly 3+/5 LUE Deficits / Details: ROM WFL shld, elbow and wrist. Pt with MMT grossly 4-/5   Lower Extremity Assessment Lower Extremity Assessment: Overall WFL for tasks assessed;Generalized weakness (ROM somewhat limited for hip rotation and flexion for LB ADLs such as donning LB clothing.)       Communication Communication Communication: No difficulties   Cognition Arousal/Alertness: Awake/alert Behavior During Therapy: WFL for tasks assessed/performed Overall Cognitive Status: Within Functional Limits for tasks assessed                                 General Comments: Pt oriented x3 (place, month/year, person, some aspects of situation). Not oriented to date/day. Able to follow all simple 1-2 step commands appropriately.   General Comments       Exercises Other Exercises Other Exercises: OT ed re: role of OT in acute setting. OT then engages pt in LB dressing task seated EOB with MOD/MAX A. Pt requires MIN/MOD A for ADL transfer with RW to [facility] and then to recliner. Pt left in chair with breakfast tray and chair alarm. All needs met and in reach.   Shoulder Instructions      Home Living Family/patient expects to be discharged to:: Private residence Living Arrangements: Alone Available Help at Discharge: Other (Comment) (Pt oreinted and confirms this, infor collected from previous hospitalization: previously had a PCA, but states that she is not on a regular schedule and was barely working before recent falls/rehab stay.) Type of Home: Apartment Home Access: Stairs to  enter [employer] of Steps: 1 small step to access apartment area Entrance Stairs-Rails: None Home Layout: One level     Bathroom Shower/Tub: Tub/shower unit         Home Equipment: Clinical cytogeneticist - 4 wheels;Walker - 2 wheels   Additional Comments: Senior living apt; history obtained from chart review secondary to patient being a poor historian, pt did endorse living in an apt but could not provide any further details      Prior Functioning/Environment Level of Independence: Needs assistance  Gait / Transfers Assistance Needed: Pt reports she was walking with walker prior to recent hospitalizations and rehab stay. States she was walking in the grocery store earlier this year, but has more recently been very limited to short [place] distances with walker and even more limited since April 2022 hospital stay and rehab stay. ADL's / Homemaking Assistance Needed: before previous hospitalizaiton/rehab stay: Pt was MOD I for cooking and HH IADLs. Pt had an aide that provded transportation or orders [employer] grocereies; takes [employer] transportation to appointments  OT Problem List: Decreased strength;Decreased range of motion;Decreased activity tolerance;Impaired balance (sitting and/or standing);Decreased safety awareness;Decreased knowledge of use of DME or AE;Decreased knowledge of precautions;Pain      OT Treatment/Interventions: Self-care/ADL training;DME and/or AE instruction;Therapeutic activities;Therapeutic exercise;Patient/family education;Balance training    OT Goals(Current goals can be found in the care plan section) Acute Rehab OT Goals Patient Stated Goal: to get stronger OT Goal Formulation: With patient Time For Goal Achievement: 10/12/20 Potential to Achieve Goals: Good ADL Goals Pt Will Perform Lower Body Dressing: with min assist;sitting/lateral leans (with AE PRN) Pt Will Transfer to Toilet: with min assist;ambulating;bedside commode (to BSC ~10' away  with LRAD to improve fxl mobility tolerance for fxl HH distances) Pt Will Perform Toileting - Clothing Manipulation and hygiene: with min assist;sit to/from stand Pt/caregiver will Perform Home Exercise Program: Increased strength;Both right and left upper extremity;With minimal assist  OT Frequency: Min 1X/week   Barriers to D/C: Decreased caregiver support          Co-evaluation              AM-PAC OT "6 Clicks" Daily Activity     Outcome Measure Help from another person eating meals?: None Help from another person taking care of personal grooming?: A Little Help from another person toileting, which includes using toliet, bedpan, or urinal?: A Lot Help from another person bathing (including washing, rinsing, drying)?: A Lot Help from another person to put on and taking off regular upper body clothing?: A Little Help from another person to put on and taking off regular lower body clothing?: A Lot 6 Click Score: 16   End of Session Equipment Utilized During Treatment: Gait belt;Rolling walker Nurse Communication: Mobility status  Activity Tolerance: Patient tolerated treatment well Patient left: in chair;with call bell/phone within reach;with chair alarm set  OT Visit Diagnosis: Unsteadiness on feet (R26.81);Muscle weakness (generalized) (M62.81)                Time: 4098-1191 OT Time Calculation (min): 31 min Charges:  OT General Charges $OT Visit: 1 Visit OT Evaluation $OT Eval Moderate Complexity: 1 Mod OT Treatments $Self Care/Home Management : 8-22 mins $Therapeutic Activity: 8-22 mins  Gerrianne Scale, MS, OTR/L ascom [phone] 09/28/20, 10:35 AM

## 2020-09-28 NOTE — TOC Progression Note (Signed)
Transition of Care [facility]) - Progression Note    Patient Details  Name: [name] MRN: [id number] Date of Birth: [birth year]  Transition of Care [facility]) CM/SW [place], RN Phone Number: 09/28/2020, 9:12 AM  Clinical Narrative:    Checked the [place], pending, continue to check for approval  Expected Discharge Plan: Long Term Nursing Home Barriers to Discharge: Continued Medical Work up  Expected Discharge Plan and Services Expected Discharge Plan: [place] In-house Referral: Clinical Social Work   Post Acute Care Choice: Nursing Home ([place]) Living arrangements for the past 2 months: [place] Expected Discharge Date: 09/27/20                                     Social Determinants of Health (SDOH) Interventions    Readmission Risk Interventions Readmission Risk Prevention Plan 08/24/2020  PCP or Specialist Appt within 5-7 Days Complete  Medication Review (RN CM) Complete  Some recent data might be hidden

## 2020-09-28 NOTE — Progress Notes (Addendum)
PROGRESS NOTE   HPI was taken from Dr. Velia Meyer: [name] is a 75 y.o. female with medical history significant for type 2 diabetes mellitus, essential hypertension, acquired hypothyroidism, who is admitted to [facility] on 09/15/2020 with acute hypoxic respiratory failure due to suspected aspiration pneumonia after presenting from SNF to [facility] ED for evaluation of potential aspiration.   Bilirubin this evening, the patient was witnessed by the SNF staff to start choking and coughing on her dinner, which included a hotdog.  The patient reportedly continued a significant continuous coughing for approximately 30 minutes, during which staff noted the patient to cough up several bits of her dinner, including hotdog.  After this timeframe, staff contacted EMS, who noted the patient's initial oxygen saturation to be in the low 80s on room air, prompting initiation of nonrebreather for bringing the patient to [facility] ED for further evaluation.  No known baseline supplemental oxygen requirements, and no known underlying chronic pulmonary pathology.   Per my discussion with the patient, she reports mild shortness of breath and continued coughing.  Denies any associated subjective fever, chills, rigors, or generalized myalgias.  She also denies any chest pains, palpitations, diaphoresis, nausea, vomiting.  Not associate with any wheezing or hemoptysis.  Denies any associated orthopnea, PND, or new onset peripheral edema. It is unclear if the patient has any known underlying history of dysphagia or requirements for modified diet.     ED Course:  Vital signs in the ED were notable for the following: Temperature max 97.5, heart rate 95-96; initial respiratory rate noted to be 24, with subsequent improvement into the range of 14-15; blood pressure 104/72-112/78; supplemental oxygen delivery has been transitioned from nonrebreather to 6 L nasal cannula, upon which the patient  has been maintaining oxygen saturations in the range of 94 to 98%.  Labs were notable for the following: CMP was notable for the following: Sodium 135, bicarbonate 23, BUN 27, creatinine 1.28 relative to most recent prior creatinine data point of 1.03 on 08/24/2020.  CBC notable for white blood cell count of 15,000.  Nasopharyngeal COVID-19/influenza PCR were performed in the ED today, and found to be negative.  Chest x-ray showed no evidence of acute cardiopulmonary process.  The EDP discussed the patient's case with the on-call critical care physician, Dr. Keenan Bachelor, who recommended admission to the hospitalistservice, with plan for critical care service to follow along,with anticipated bronchoscopy tomorrow(09/16/20)along with interval antibiotics for suspected aspiration pneumonia, including specific recommendation for meropenem in the setting of reported penicillin allergy.  While in the ED, the following were administered: Meropenem 1 g IV x1.  Subsequently, the patient was admitted to the PCU for further evaluation and management of presenting acute hypoxic respiratory failure in the setting of suspected aspiration pneumonia.    As per Dr. Leslye Peer: Patient was admitted 09/15/2020 with aspiration of hotdog.  Past medical history of hypothyroidism, type 2 diabetes mellitus, and bipolar disorder.  Her respiratory status rapidly deteriorated where she required BiPAP and changed to stepdown admission.  Her oxygen requirements worsened requiring 100% on the BiPAP.  She was intubated on 09/16/2020 and had a bronchoscopy which removed some food.  She was extubated on 09/17/2020.  She is currently on nasal cannula 4 L.  There was a code stroke called yesterday while she was down at swallow study.  Seen by neurology.  CT and MRI of the brain negative for acute stroke.  Hospital course from Dr. Jimmye Norman 5/4-5/10/22: Pt was found to have  respiratory failure and pneumonia. Pt was weaned from supplemental  oxygen prior to d/c and has completed the abx course from pneumonia. PT/OT recommended SNF but pt's insurance denied it. called on 09/22/20 but unable to reach appropriate doc for peer to peer, CM notified and was given another number for which the company said that peer to peer was closed now despite calling w/in 1 hour of being told there was a requested peer to peer review. CM is aware and working on this     [name]  TML:[id number] DOB: 01/24/1946 DOA: 09/15/2020 PCP: Mechele Claude, FNP   Assessment & Plan:   Principal Problem:   Bipolar 1 disorder with moderate mania ([place]) Active Problems:   Essential hypertension   Hypothyroidism   Dyslipidemia   Type 2 diabetes mellitus with hyperlipidemia ([place])   AKI (acute kidney injury) ([place])   Aspiration pneumonia ([place])   Acute respiratory failure with hypoxia (HCC)   Acute hypoxemic respiratory failure (HCC)   SOB (shortness of breath)   Endotracheally intubated   Acute hypoxic respiratory failure: likely secondary to aspiration. S/p intubation & bronchoscopy. Extubated on 09/17/20. Resolved   Likely aspiration pneumonia: completed abx course. Continue on bronchodilators and encourage incentive spirometry  Hypokalemia: within normal limits  Leukocytosis: resolved  Hypothyroidism: continue on home dose of levothyroxine   DM2: likely poorly controlled. Continue on lantus, SSI w/ accuchecks   Bipolar disorder: continue on home dose of fluoxetine, depakote, latuda. Haldol prn   Weakness: PT/OT recs SNF. Pt's insurance denied SNF and request peer to peer review and number called on 09/22/20 but unable to reach appropriate doc for peer to peer, CM notified and was given another number for which the company said that peer to peer was closed now despite calling w/in 1 hour of being told there was a requested peer to peer review. CM is aware. Will ask PT for reeval -She continues to remain weak and PT, OT recommends SNF.  TOC  working with insurance and have appealed to reconsider SNF for safety of patient    DVT prophylaxis: lovenox  Code Status: full  Family Communication: updating Del at [phone] on 5/12 Disposition Plan: likely d/c to SNF pending insurance approval   Level of care: Med-Surg   Status is: Inpatient  Remains inpatient appropriate because:Unsafe d/c plan and Inpatient level of care appropriate due to severity of illness. Pt's insurance denied SNF and and we have reappealed as she continues to remain very weak    Dispo: The patient is from: Home              Anticipated d/c is to: SNF              Patient currently is not medically stable to d/c.  She remains very weak and was unable to get up by herself yesterday.   Difficult to place patient Yes   Consultants:   Psych    Procedures:    Antimicrobials   Subjective: Pt continues to remain weak.  She was sitting in the chair when I saw her and hoping to go to SNF to get stronger  Objective: Vitals:   09/28/20 0346 09/28/20 0615 09/28/20 0742 09/28/20 1129  BP: 135/81  110/63 112/64  Pulse: (!) 57  (!) 59 63  Resp: $Remo'16  16 16  'DmVff$ Temp: 98.1 F (36.7 C)  98.3 F (36.8 C) 98.6 F (37 C)  TempSrc: Oral     SpO2: 94%  97% 94%  Weight:  78.6 kg    Height:        Intake/Output Summary (Last 24 hours) at 09/28/2020 1259 Last data filed at 09/28/2020 0950 Gross per 24 hour  Intake 260 ml  Output 500 ml  Net -240 ml   Filed Weights   09/21/20 0500 09/27/20 0358 09/28/20 0615  Weight: 78.6 kg 78.8 kg 78.6 kg    Examination:  General exam: Appears calm & comfortable  Respiratory system: decreased breath sounds b/l  Cardiovascular system:  S1/S2+. No clicks or rubs Gastrointestinal system: Abd is soft, NT, ND & hypoactive bowel sounds  Central nervous system: Alert and oriented. Moves all extremities  Psychiatry: Judgement and insight appear normal. Flat mood and affect    Data Reviewed: I have personally reviewed  following labs and imaging studies  CBC: Recent Labs  Lab 09/22/20 0501 09/23/20 0511 09/24/20 0530 09/25/20 0548 09/27/20 0555  WBC 7.5 7.0 7.8 9.0 10.1  HGB 11.8* 11.8* 12.4 12.7 12.7  HCT 35.3* 35.0* 37.3 38.6 38.6  MCV 87.8 90.0 89.7 89.8 90.4  PLT 219 224 263 289 465   Basic Metabolic Panel: Recent Labs  Lab 09/22/20 0501 09/23/20 0511 09/24/20 0530 09/25/20 0548 09/27/20 0555  NA 137 138 137 138 138  K 3.2* 3.5 3.6 3.5 4.1  CL 103 103 102 103 103  CO2 $Re'26 27 25 27 27  'pyr$ GLUCOSE 127* 102* 150* 107* 100*  BUN 8 7* 7* 9 9  CREATININE 0.76 0.75 0.74 0.75 0.78  CALCIUM 8.3* 8.4* 8.6* 8.7* 9.0   GFR: Estimated Creatinine Clearance: 60.3 mL/min (by C-G formula based on SCr of 0.78 mg/dL). Liver Function Tests: No results for input(s): AST, ALT, ALKPHOS, BILITOT, PROT, ALBUMIN in the last 168 hours. No results for input(s): LIPASE, AMYLASE in the last 168 hours. No results for input(s): AMMONIA in the last 168 hours. Coagulation Profile: No results for input(s): INR, PROTIME in the last 168 hours. Cardiac Enzymes: No results for input(s): CKTOTAL, CKMB, CKMBINDEX, TROPONINI in the last 168 hours. BNP (last 3 results) No results for input(s): PROBNP in the last 8760 hours. HbA1C: No results for input(s): HGBA1C in the last 72 hours. CBG: Recent Labs  Lab 09/27/20 1948 09/28/20 0004 09/28/20 0348 09/28/20 0744 09/28/20 1131  GLUCAP 205* 94 113* 143* 182*   Lipid Profile: No results for input(s): CHOL, HDL, LDLCALC, TRIG, CHOLHDL, LDLDIRECT in the last 72 hours. Thyroid Function Tests: No results for input(s): TSH, T4TOTAL, FREET4, T3FREE, THYROIDAB in the last 72 hours. Anemia Panel: No results for input(s): VITAMINB12, FOLATE, FERRITIN, TIBC, IRON, RETICCTPCT in the last 72 hours. Sepsis Labs: No results for input(s): PROCALCITON, LATICACIDVEN in the last 168 hours.  Recent Results (from the past 240 hour(s))  Resp Panel by RT-PCR (Flu A&B, Covid)  Nasopharyngeal Swab     Status: None   Collection Time: 09/22/20  4:25 PM   Specimen: Nasopharyngeal Swab; Nasopharyngeal(NP) swabs in vial transport medium  Result Value Ref Range Status   SARS Coronavirus 2 by RT PCR NEGATIVE NEGATIVE Final    Comment: (NOTE) SARS-CoV-2 target nucleic acids are NOT DETECTED.  The SARS-CoV-2 RNA is generally detectable in upper respiratory specimens during the acute phase of infection. The lowest concentration of SARS-CoV-2 viral copies this assay can detect is 138 copies/mL. A negative result does not preclude SARS-Cov-2 infection and should not be used as the sole basis for treatment or other patient management decisions. A negative result may occur with  improper specimen collection/handling, submission of  specimen other than nasopharyngeal swab, presence of viral mutation(s) within the areas targeted by this assay, and inadequate number of viral copies(<138 copies/mL). A negative result must be combined with clinical observations, patient history, and epidemiological information. The expected result is Negative.  Fact Sheet for Patients:  EntrepreneurPulse.com.au  Fact Sheet for Healthcare Providers:  IncredibleEmployment.be  This test is no t yet approved or cleared by the [place] FDA and  has been authorized for detection and/or diagnosis of SARS-CoV-2 by FDA under an Emergency Use Authorization (EUA). This EUA will remain  in effect (meaning this test can be used) for the duration of the COVID-19 declaration under Section 564(b)(1) of the Act, 21 U.S.C.section 360bbb-3(b)(1), unless the authorization is terminated  or revoked sooner.       Influenza A by PCR NEGATIVE NEGATIVE Final   Influenza B by PCR NEGATIVE NEGATIVE Final    Comment: (NOTE) The Xpert Xpress SARS-CoV-2/FLU/RSV plus assay is intended as an aid in the diagnosis of influenza from Nasopharyngeal swab specimens and should not be  used as a sole basis for treatment. Nasal washings and aspirates are unacceptable for Xpert Xpress SARS-CoV-2/FLU/RSV testing.  Fact Sheet for Patients: EntrepreneurPulse.com.au  Fact Sheet for Healthcare Providers: IncredibleEmployment.be  This test is not yet approved or cleared by the [place] FDA and has been authorized for detection and/or diagnosis of SARS-CoV-2 by FDA under an Emergency Use Authorization (EUA). This EUA will remain in effect (meaning this test can be used) for the duration of the COVID-19 declaration under Section 564(b)(1) of the Act, 21 U.S.C. section 360bbb-3(b)(1), unless the authorization is terminated or revoked.  Performed at [facility], [address] [place], [place] [address]          Radiology Studies: No results found.      Scheduled Meds: . chlorhexidine gluconate (MEDLINE KIT)  15 mL Mouth Rinse BID  . Chlorhexidine Gluconate Cloth  6 each Topical Q2200  . divalproex  500 mg Oral Q12H  . enoxaparin (LOVENOX) injection  40 mg Subcutaneous Q24H  . FLUoxetine  60 mg Oral Daily  . insulin aspart  0-15 Units Subcutaneous Q4H  . insulin glargine  6 Units Subcutaneous QHS  . levothyroxine  88 mcg Oral QAC breakfast  . lurasidone  20 mg Oral Daily  . pantoprazole  40 mg Oral Q24H  . thiamine  100 mg Oral Daily  . vitamin B-12  1,000 mcg Oral Daily   Continuous Infusions:    LOS: 13 days    Time spent: 20 mins    Max Sane, MD [facility] Pager [phone]  If 7PM-7AM, please contact night-coverage 09/28/2020, 12:59 PM

## 2020-09-28 NOTE — Progress Notes (Signed)
Physical Therapy Treatment Patient Details Name: [name] MRN: [id number] DOB: 1946-04-24 Today's Date: 09/28/2020    History of Present Illness Pt is a 75 y.o. female with medical history significant for type 2 diabetes mellitus, essential hypertension, acquired hypothyroidism, who is admitted to [facility] on 09/15/2020 with acute hypoxic respiratory failure. Extubated 09/17/2020.  MD assessment includes: Acute hypoxic respiratory failure secondary to aspiration with food removed from the lungs on vent on extubated, hypothyroidism, depression on numerous psychiatric medications, hypomagnesemia, and weakness.    PT Comments    Patient making slow progress towards meeting goals. Patient required significant assistance for standing today and is fatigued with minimal activity. Maximal assistance, mostly for lifting, for sit to stand from bed. Poor standing balance with right and posterior lean, requiring facilitation to maintain midline standing posture. Standing tolerance of less than 30 seconds. Patient had bowel incontinence also and required maximal assistance for hygiene/ peri care. Due to poor standing tolerance and fatigue with minimal activity, unable to progress walking this session. Recommend to continue PT to maximize independence and decrease caregiver burden. Highly recommend SNF placement at this time.    Follow Up Recommendations  SNF;Supervision/Assistance - 24 hour     Equipment Recommendations  Other (comment) (to be determined at next level of care)    Recommendations for Other Services       Precautions / Restrictions Precautions Precautions: Fall Restrictions Weight Bearing Restrictions: No    Mobility  Bed Mobility Overal bed mobility: Needs Assistance Bed Mobility: Supine to Sit     Supine to sit: Max assist;HOB elevated Sit to supine: Mod assist   General bed mobility comments: maximal assistance for trunk support to sit  upright. verbal cues for technique and task initiation    Transfers Overall transfer level: Needs assistance Equipment used: Rolling walker (2 wheeled) Transfers: Sit to/from Stand Sit to Stand: Max assist         General transfer comment: significant lifting assistance provided. verbal cues for hand placement and forward weight shifting for lift off.  Ambulation/Gait             General Gait Details: unable to progress to walking due to poor standing balance, limited standing tolerance. of note, patient had been incontinent of stool at some point prior to or during standing.   Stairs             Wheelchair Mobility    Modified Rankin (Stroke Patients Only)       Balance Overall balance assessment: Needs assistance   Sitting balance-Leahy Scale: Poor Sitting balance - Comments: periods of good sitting balance demonstrated, however patient leans to the right with fatigue and required faciliation to maintain midline sitting balance Postural control: Right lateral lean Standing balance support: Bilateral upper extremity supported Standing balance-Leahy Scale: Poor Standing balance comment: Min A- Mod A, faciliation, and verbal cues to maintain midline. patient has posterior and right lean in standing                            Cognition Arousal/Alertness: Awake/alert Behavior During Therapy: WFL for tasks assessed/performed Overall Cognitive Status: Within Functional Limits for tasks assessed                                        Exercises      General Comments  Pertinent Vitals/Pain Pain Assessment: No/denies pain    Home Living                      Prior Function            PT Goals (current goals can now be found in the care plan section) Acute Rehab PT Goals Patient Stated Goal: to get stronger PT Goal Formulation: With patient Time For Goal Achievement: 10/01/20 Potential to Achieve Goals:  Fair Progress towards PT goals: Progressing toward goals    Frequency    Min 2X/week      PT Plan Current plan remains appropriate    Co-evaluation              AM-PAC PT "6 Clicks" Mobility   Outcome Measure  Help needed turning from your back to your side while in a flat bed without using bedrails?: A Lot Help needed moving from lying on your back to sitting on the side of a flat bed without using bedrails?: A Lot Help needed moving to and from a bed to a chair (including a wheelchair)?: A Lot Help needed standing up from a chair using your arms (e.g., wheelchair or bedside chair)?: A Lot Help needed to walk in hospital room?: A Lot Help needed climbing 3-5 steps with a railing? : Total 6 Click Score: 11    End of Session   Activity Tolerance: Patient tolerated treatment well Patient left: in bed;with call bell/phone within reach;with bed alarm set;with SCD's reapplied Nurse Communication:  (white board updated with mobility status) PT Visit Diagnosis: Difficulty in walking, not elsewhere classified (R26.2);Muscle weakness (generalized) (M62.81)     Time: 4166-0630 PT Time Calculation (min) (ACUTE ONLY): 24 min  Charges:  $Therapeutic Activity: 23-37 mins                     [name], PT, MPT    Percell Locus 09/28/2020, 2:41 PM

## 2020-09-28 NOTE — TOC Progression Note (Signed)
Transition of Care [facility]) - Progression Note    Patient Details  Name: [name] MRN: [id number] Date of Birth: April 17, 1946  Transition of Care [facility]) CM/SW [place], RN Phone Number: 09/28/2020, 9:43 AM  Clinical Narrative:   Received a call from [facility] they stated that they would not consider a new submission due to a previous denial, I requested a fast appeal, and was given a phone number  [phone], Fax number [phone], the appeal was marked as expedited they have up to 72 hours to collect information and make decision, faxed supporting clinicals,    Expected Discharge Plan: [place] Barriers to Discharge: Continued Medical Work up  Expected Discharge Plan and Services Expected Discharge Plan: [place] In-house Referral: Clinical Social Work   Post Acute Care Choice: Nursing Home ([place]) Living arrangements for the past 2 months: [place] Expected Discharge Date: 09/27/20                                     Social Determinants of Health (SDOH) Interventions    Readmission Risk Interventions Readmission Risk Prevention Plan 08/24/2020  PCP or Specialist Appt within 5-7 Days Complete  Medication Review (RN CM) Complete  Some recent data might be hidden

## 2020-09-29 DIAGNOSIS — N179 Acute kidney failure, unspecified: Secondary | ICD-10-CM | POA: Diagnosis not present

## 2020-09-29 DIAGNOSIS — J9601 Acute respiratory failure with hypoxia: Secondary | ICD-10-CM | POA: Diagnosis not present

## 2020-09-29 DIAGNOSIS — F3112 Bipolar disorder, current episode manic without psychotic features, moderate: Secondary | ICD-10-CM | POA: Diagnosis not present

## 2020-09-29 LAB — GLUCOSE, CAPILLARY
Glucose-Capillary: 116 mg/dL — ABNORMAL HIGH (ref 70–99)
Glucose-Capillary: 117 mg/dL — ABNORMAL HIGH (ref 70–99)
Glucose-Capillary: 148 mg/dL — ABNORMAL HIGH (ref 70–99)
Glucose-Capillary: 185 mg/dL — ABNORMAL HIGH (ref 70–99)
Glucose-Capillary: 187 mg/dL — ABNORMAL HIGH (ref 70–99)
Glucose-Capillary: 214 mg/dL — ABNORMAL HIGH (ref 70–99)

## 2020-09-29 NOTE — Progress Notes (Signed)
PROGRESS NOTE   HPI was taken from Dr. Velia Meyer: [name] is a 75 y.o. female with medical history significant for type 2 diabetes mellitus, essential hypertension, acquired hypothyroidism, who is admitted to [facility] on 09/15/2020 with acute hypoxic respiratory failure due to suspected aspiration pneumonia after presenting from SNF to [facility] ED for evaluation of potential aspiration.   Bilirubin this evening, the patient was witnessed by the SNF staff to start choking and coughing on her dinner, which included a hotdog.  The patient reportedly continued a significant continuous coughing for approximately 30 minutes, during which staff noted the patient to cough up several bits of her dinner, including hotdog.  After this timeframe, staff contacted EMS, who noted the patient's initial oxygen saturation to be in the low 80s on room air, prompting initiation of nonrebreather for bringing the patient to [facility] ED for further evaluation.  No known baseline supplemental oxygen requirements, and no known underlying chronic pulmonary pathology.   Per my discussion with the patient, she reports mild shortness of breath and continued coughing.  Denies any associated subjective fever, chills, rigors, or generalized myalgias.  She also denies any chest pains, palpitations, diaphoresis, nausea, vomiting.  Not associate with any wheezing or hemoptysis.  Denies any associated orthopnea, PND, or new onset peripheral edema. It is unclear if the patient has any known underlying history of dysphagia or requirements for modified diet.     ED Course:  Vital signs in the ED were notable for the following: Temperature max 97.5, heart rate 95-96; initial respiratory rate noted to be 24, with subsequent improvement into the range of 14-15; blood pressure 104/72-112/78; supplemental oxygen delivery has been transitioned from nonrebreather to 6 L nasal cannula, upon which the patient  has been maintaining oxygen saturations in the range of 94 to 98%.  Labs were notable for the following: CMP was notable for the following: Sodium 135, bicarbonate 23, BUN 27, creatinine 1.28 relative to most recent prior creatinine data point of 1.03 on 08/24/2020.  CBC notable for white blood cell count of 15,000.  Nasopharyngeal COVID-19/influenza PCR were performed in the ED today, and found to be negative.  Chest x-ray showed no evidence of acute cardiopulmonary process.  The EDP discussed the patient's case with the on-call critical care physician, Dr. Keenan Bachelor, who recommended admission to the hospitalistservice, with plan for critical care service to follow along,with anticipated bronchoscopy tomorrow(09/16/20)along with interval antibiotics for suspected aspiration pneumonia, including specific recommendation for meropenem in the setting of reported penicillin allergy.  While in the ED, the following were administered: Meropenem 1 g IV x1.  Subsequently, the patient was admitted to the PCU for further evaluation and management of presenting acute hypoxic respiratory failure in the setting of suspected aspiration pneumonia.    As per Dr. Leslye Peer: Patient was admitted 09/15/2020 with aspiration of hotdog.  Past medical history of hypothyroidism, type 2 diabetes mellitus, and bipolar disorder.  Her respiratory status rapidly deteriorated where she required BiPAP and changed to stepdown admission.  Her oxygen requirements worsened requiring 100% on the BiPAP.  She was intubated on 09/16/2020 and had a bronchoscopy which removed some food.  She was extubated on 09/17/2020.  She is currently on nasal cannula 4 L.  There was a code stroke called yesterday while she was down at swallow study.  Seen by neurology.  CT and MRI of the brain negative for acute stroke.  Hospital course from Dr. Jimmye Norman 5/4-5/10/22: Pt was found to have  respiratory failure and pneumonia. Pt was weaned from supplemental  oxygen prior to d/c and has completed the abx course from pneumonia. PT/OT recommended SNF but pt's insurance denied it. called on 09/22/20 but unable to reach appropriate doc for peer to peer, CM notified and was given another number for which the company said that peer to peer was closed now despite calling w/in 1 hour of being told there was a requested peer to peer review. CM is aware and working on this     [name]  PYP:[id number] DOB: [birth year] DOA: 09/15/2020 PCP: Mechele Claude, FNP   Assessment & Plan:   Principal Problem:   Bipolar 1 disorder with moderate mania ([place]) Active Problems:   Essential hypertension   Hypothyroidism   Dyslipidemia   Type 2 diabetes mellitus with hyperlipidemia ([place])   AKI (acute kidney injury) ([place])   Aspiration pneumonia ([place])   Acute respiratory failure with hypoxia (HCC)   Acute hypoxemic respiratory failure (HCC)   SOB (shortness of breath)   Endotracheally intubated   Acute hypoxic respiratory failure: likely secondary to aspiration. S/p intubation & bronchoscopy. Extubated on 09/17/20. Resolved   Likely aspiration pneumonia: completed abx course. Continue on bronchodilators and encourage incentive spirometry  Hypokalemia: within normal limits  Leukocytosis: resolved  Hypothyroidism: continue on home dose of levothyroxine   DM2: likely poorly controlled. Continue on lantus, SSI w/ accuchecks   Bipolar disorder: continue on home dose of fluoxetine, depakote, latuda. Haldol prn   Weakness: PT/OT recs SNF.  Waiting on insurance Auth   DVT prophylaxis: lovenox  Code Status: full  Family Communication: updating Del at [phone] on 5/12.  I left message on 5/13 Disposition Plan: likely d/c to SNF pending insurance approval   Level of care: Med-Surg   Status is: Inpatient  Remains inpatient appropriate because:Unsafe d/c plan and Inpatient level of care appropriate due to severity of illness. Pt's insurance denied SNF  and and we have reappealed as she continues to remain very weak    Dispo: The patient is from: Home              Anticipated d/c is to: SNF              Patient currently is not medically stable to d/c.  She remains very weak and was unable to get up by herself yesterday.   Difficult to place patient Yes   Consultants:   Psych     Subjective: Pt continues to remain weak.  Wants to go home.  Waiting for insurance Auth.  Objective: Vitals:   09/28/20 2000 09/29/20 0008 09/29/20 0436 09/29/20 0904  BP: 101/61 132/84 139/60 124/69  Pulse: 63 (!) 57 (!) 53 61  Resp: $Remo'18 15 17 18  'eiNHx$ Temp: 99.1 F (37.3 C) 98.2 F (36.8 C) 98.1 F (36.7 C) 98.7 F (37.1 C)  TempSrc: Oral Oral Oral   SpO2: 90% 96% 95% 100%  Weight:      Height:        Intake/Output Summary (Last 24 hours) at 09/29/2020 1515 Last data filed at 09/29/2020 1403 Gross per 24 hour  Intake 480 ml  Output 900 ml  Net -420 ml   Filed Weights   09/21/20 0500 09/27/20 0358 09/28/20 0615  Weight: 78.6 kg 78.8 kg 78.6 kg    Examination:  General exam: Appears calm & comfortable  Respiratory system: decreased breath sounds b/l  Cardiovascular system:  S1/S2+. No clicks or rubs Gastrointestinal system: Abd is soft, NT,  ND & hypoactive bowel sounds  Central nervous system: Alert and oriented. Moves all extremities  Psychiatry: Judgement and insight appear normal. Flat mood and affect    Data Reviewed: I have personally reviewed following labs and imaging studies  CBC: Recent Labs  Lab 09/23/20 0511 09/24/20 0530 09/25/20 0548 09/27/20 0555  WBC 7.0 7.8 9.0 10.1  HGB 11.8* 12.4 12.7 12.7  HCT 35.0* 37.3 38.6 38.6  MCV 90.0 89.7 89.8 90.4  PLT 224 263 289 656   Basic Metabolic Panel: Recent Labs  Lab 09/23/20 0511 09/24/20 0530 09/25/20 0548 09/27/20 0555  NA 138 137 138 138  K 3.5 3.6 3.5 4.1  CL 103 102 103 103  CO2 $Re'27 25 27 27  'lnQ$ GLUCOSE 102* 150* 107* 100*  BUN 7* 7* 9 9  CREATININE 0.75 0.74  0.75 0.78  CALCIUM 8.4* 8.6* 8.7* 9.0   GFR: Estimated Creatinine Clearance: 60.3 mL/min (by C-G formula based on SCr of 0.78 mg/dL). Liver Function Tests: No results for input(s): AST, ALT, ALKPHOS, BILITOT, PROT, ALBUMIN in the last 168 hours. No results for input(s): LIPASE, AMYLASE in the last 168 hours. No results for input(s): AMMONIA in the last 168 hours. Coagulation Profile: No results for input(s): INR, PROTIME in the last 168 hours. Cardiac Enzymes: No results for input(s): CKTOTAL, CKMB, CKMBINDEX, TROPONINI in the last 168 hours. BNP (last 3 results) No results for input(s): PROBNP in the last 8760 hours. HbA1C: No results for input(s): HGBA1C in the last 72 hours. CBG: Recent Labs  Lab 09/28/20 2209 09/29/20 0011 09/29/20 0432 09/29/20 0816 09/29/20 1213  GLUCAP 147* 117* 148* 116* 185*   Lipid Profile: No results for input(s): CHOL, HDL, LDLCALC, TRIG, CHOLHDL, LDLDIRECT in the last 72 hours. Thyroid Function Tests: No results for input(s): TSH, T4TOTAL, FREET4, T3FREE, THYROIDAB in the last 72 hours. Anemia Panel: No results for input(s): VITAMINB12, FOLATE, FERRITIN, TIBC, IRON, RETICCTPCT in the last 72 hours. Sepsis Labs: No results for input(s): PROCALCITON, LATICACIDVEN in the last 168 hours.  Recent Results (from the past 240 hour(s))  Resp Panel by RT-PCR (Flu A&B, Covid) Nasopharyngeal Swab     Status: None   Collection Time: 09/22/20  4:25 PM   Specimen: Nasopharyngeal Swab; Nasopharyngeal(NP) swabs in vial transport medium  Result Value Ref Range Status   SARS Coronavirus 2 by RT PCR NEGATIVE NEGATIVE Final    Comment: (NOTE) SARS-CoV-2 target nucleic acids are NOT DETECTED.  The SARS-CoV-2 RNA is generally detectable in upper respiratory specimens during the acute phase of infection. The lowest concentration of SARS-CoV-2 viral copies this assay can detect is 138 copies/mL. A negative result does not preclude SARS-Cov-2 infection and should  not be used as the sole basis for treatment or other patient management decisions. A negative result may occur with  improper specimen collection/handling, submission of specimen other than nasopharyngeal swab, presence of viral mutation(s) within the areas targeted by this assay, and inadequate number of viral copies(<138 copies/mL). A negative result must be combined with clinical observations, patient history, and epidemiological information. The expected result is Negative.  Fact Sheet for Patients:  EntrepreneurPulse.com.au  Fact Sheet for Healthcare Providers:  IncredibleEmployment.be  This test is no t yet approved or cleared by the [place] FDA and  has been authorized for detection and/or diagnosis of SARS-CoV-2 by FDA under an Emergency Use Authorization (EUA). This EUA will remain  in effect (meaning this test can be used) for the duration of the COVID-19 declaration under Section  564(b)(1) of the Act, 21 U.S.C.section 360bbb-3(b)(1), unless the authorization is terminated  or revoked sooner.       Influenza A by PCR NEGATIVE NEGATIVE Final   Influenza B by PCR NEGATIVE NEGATIVE Final    Comment: (NOTE) The Xpert Xpress SARS-CoV-2/FLU/RSV plus assay is intended as an aid in the diagnosis of influenza from Nasopharyngeal swab specimens and should not be used as a sole basis for treatment. Nasal washings and aspirates are unacceptable for Xpert Xpress SARS-CoV-2/FLU/RSV testing.  Fact Sheet for Patients: EntrepreneurPulse.com.au  Fact Sheet for Healthcare Providers: IncredibleEmployment.be  This test is not yet approved or cleared by the [place] FDA and has been authorized for detection and/or diagnosis of SARS-CoV-2 by FDA under an Emergency Use Authorization (EUA). This EUA will remain in effect (meaning this test can be used) for the duration of the COVID-19 declaration under  Section 564(b)(1) of the Act, 21 U.S.C. section 360bbb-3(b)(1), unless the authorization is terminated or revoked.  Performed at [facility], [address] [place], [place] [address]          Radiology Studies: No results found.      Scheduled Meds: . chlorhexidine gluconate (MEDLINE KIT)  15 mL Mouth Rinse BID  . Chlorhexidine Gluconate Cloth  6 each Topical Q2200  . divalproex  500 mg Oral Q12H  . enoxaparin (LOVENOX) injection  40 mg Subcutaneous Q24H  . FLUoxetine  60 mg Oral Daily  . insulin aspart  0-15 Units Subcutaneous Q4H  . insulin glargine  6 Units Subcutaneous QHS  . levothyroxine  88 mcg Oral QAC breakfast  . lurasidone  20 mg Oral Daily  . pantoprazole  40 mg Oral Q24H  . thiamine  100 mg Oral Daily  . vitamin B-12  1,000 mcg Oral Daily   Continuous Infusions:    LOS: 14 days    Time spent: 20 mins    Max Sane, MD [facility] Pager [phone]  If 7PM-7AM, please contact night-coverage 09/29/2020, 3:15 PM

## 2020-09-29 NOTE — Care Management Important Message (Signed)
Important Message  Patient Details  Name: [name] MRN: [id number] Date of Birth: 07-May-1946   Medicare Important Message Given:  Yes     [name] 09/29/2020, 10:41 AM

## 2020-09-30 DIAGNOSIS — J9601 Acute respiratory failure with hypoxia: Secondary | ICD-10-CM | POA: Diagnosis not present

## 2020-09-30 DIAGNOSIS — F3112 Bipolar disorder, current episode manic without psychotic features, moderate: Secondary | ICD-10-CM | POA: Diagnosis not present

## 2020-09-30 DIAGNOSIS — N179 Acute kidney failure, unspecified: Secondary | ICD-10-CM | POA: Diagnosis not present

## 2020-09-30 LAB — GLUCOSE, CAPILLARY
Glucose-Capillary: 117 mg/dL — ABNORMAL HIGH (ref 70–99)
Glucose-Capillary: 141 mg/dL — ABNORMAL HIGH (ref 70–99)
Glucose-Capillary: 143 mg/dL — ABNORMAL HIGH (ref 70–99)
Glucose-Capillary: 175 mg/dL — ABNORMAL HIGH (ref 70–99)
Glucose-Capillary: 201 mg/dL — ABNORMAL HIGH (ref 70–99)
Glucose-Capillary: 208 mg/dL — ABNORMAL HIGH (ref 70–99)
Glucose-Capillary: 99 mg/dL (ref 70–99)

## 2020-09-30 NOTE — Progress Notes (Signed)
PROGRESS NOTE   HPI was taken from Dr. Velia Meyer: [name] is a 75 y.o. female with medical history significant for type 2 diabetes mellitus, essential hypertension, acquired hypothyroidism, who is admitted to [facility] on 09/15/2020 with acute hypoxic respiratory failure due to suspected aspiration pneumonia after presenting from SNF to [facility] ED for evaluation of potential aspiration.   Bilirubin this evening, the patient was witnessed by the SNF staff to start choking and coughing on her dinner, which included a hotdog.  The patient reportedly continued a significant continuous coughing for approximately 30 minutes, during which staff noted the patient to cough up several bits of her dinner, including hotdog.  After this timeframe, staff contacted EMS, who noted the patient's initial oxygen saturation to be in the low 80s on room air, prompting initiation of nonrebreather for bringing the patient to [facility] ED for further evaluation.  No known baseline supplemental oxygen requirements, and no known underlying chronic pulmonary pathology.   Per my discussion with the patient, she reports mild shortness of breath and continued coughing.  Denies any associated subjective fever, chills, rigors, or generalized myalgias.  She also denies any chest pains, palpitations, diaphoresis, nausea, vomiting.  Not associate with any wheezing or hemoptysis.  Denies any associated orthopnea, PND, or new onset peripheral edema. It is unclear if the patient has any known underlying history of dysphagia or requirements for modified diet.     ED Course:  Vital signs in the ED were notable for the following: Temperature max 97.5, heart rate 95-96; initial respiratory rate noted to be 24, with subsequent improvement into the range of 14-15; blood pressure 104/72-112/78; supplemental oxygen delivery has been transitioned from nonrebreather to 6 L nasal cannula, upon which the patient  has been maintaining oxygen saturations in the range of 94 to 98%.  Labs were notable for the following: CMP was notable for the following: Sodium 135, bicarbonate 23, BUN 27, creatinine 1.28 relative to most recent prior creatinine data point of 1.03 on 08/24/2020.  CBC notable for white blood cell count of 15,000.  Nasopharyngeal COVID-19/influenza PCR were performed in the ED today, and found to be negative.  Chest x-ray showed no evidence of acute cardiopulmonary process.  The EDP discussed the patient's case with the on-call critical care physician, Dr. Keenan Bachelor, who recommended admission to the hospitalistservice, with plan for critical care service to follow along,with anticipated bronchoscopy tomorrow(09/16/20)along with interval antibiotics for suspected aspiration pneumonia, including specific recommendation for meropenem in the setting of reported penicillin allergy.  While in the ED, the following were administered: Meropenem 1 g IV x1.  Subsequently, the patient was admitted to the PCU for further evaluation and management of presenting acute hypoxic respiratory failure in the setting of suspected aspiration pneumonia.    As per Dr. Leslye Peer: Patient was admitted 09/15/2020 with aspiration of hotdog.  Past medical history of hypothyroidism, type 2 diabetes mellitus, and bipolar disorder.  Her respiratory status rapidly deteriorated where she required BiPAP and changed to stepdown admission.  Her oxygen requirements worsened requiring 100% on the BiPAP.  She was intubated on 09/16/2020 and had a bronchoscopy which removed some food.  She was extubated on 09/17/2020.  She is currently on nasal cannula 4 L.  There was a code stroke called yesterday while she was down at swallow study.  Seen by neurology.  CT and MRI of the brain negative for acute stroke.  Hospital course from Dr. Jimmye Norman 5/4-5/10/22: Pt was found to have  respiratory failure and pneumonia. Pt was weaned from supplemental  oxygen prior to d/c and has completed the abx course from pneumonia. PT/OT recommended SNF but pt's insurance denied it. called on 09/22/20 but unable to reach appropriate doc for peer to peer, CM notified and was given another number for which the company said that peer to peer was closed now despite calling w/in 1 hour of being told there was a requested peer to peer review. CM is aware and working on this     [name]  TAV:[id number] DOB: 1945/05/31 DOA: 09/15/2020 PCP: Mechele Claude, FNP   Assessment & Plan:   Principal Problem:   Bipolar 1 disorder with moderate mania ([place]) Active Problems:   Essential hypertension   Hypothyroidism   Dyslipidemia   Type 2 diabetes mellitus with hyperlipidemia ([place])   AKI (acute kidney injury) ([place])   Aspiration pneumonia ([place])   Acute respiratory failure with hypoxia (H[facility])   Acute hypoxemic respiratory failure (H[facility])   SOB (shortness of breath)   Endotracheally intubated   Acute hypoxic respiratory failure: likely secondary to aspiration. S/p intubation & bronchoscopy. Extubated on 09/17/20. Resolved   Likely aspiration pneumonia: completed abx course. Continue on bronchodilators and encourage incentive spirometry  Hypokalemia: within normal limits  Leukocytosis: resolved  Hypothyroidism: continue on home dose of levothyroxine   DM2: likely poorly controlled. Continue on lantus, SSI w/ accuchecks   Bipolar disorder: continue on home dose of fluoxetine, depakote, latuda. Haldol prn   Weakness: PT/OT recs SNF.  Insurance had declined but has been appealed again and waiting for results   DVT prophylaxis: lovenox  Code Status: full  Family Communication: updated Del at [phone] on 5/13 Disposition Plan: likely d/c to SNF pending insurance approval versus home with home health   Level of care: Med-Surg   Status is: Inpatient  Remains inpatient appropriate because:Unsafe d/c plan and Inpatient level of care appropriate  due to severity of illness. Pt's insurance denied SNF and and we have reappealed as she continues to remain very weak    Dispo: The patient is from: Home              Anticipated d/c is to: SNF versus home with home health              Patient currently is not medically stable to d/c.  She remains very weak   Difficult to place patient Yes   Consultants:   Psych     Subjective: Remains same.  Waiting on insurance authorization  Objective: Vitals:   09/30/20 0048 09/30/20 0111 09/30/20 0513 09/30/20 0828  BP: (!) 102/47  112/67 (!) 126/93  Pulse: 60  62 68  Resp: $Remo'16  16 18  'rbuwY$ Temp: 98.7 F (37.1 C)  98 F (36.7 C) 98 F (36.7 C)  TempSrc:      SpO2: 92%  97% 99%  Weight:  74.7 kg    Height:        Intake/Output Summary (Last 24 hours) at 09/30/2020 1422 Last data filed at 09/30/2020 1350 Gross per 24 hour  Intake 360 ml  Output 900 ml  Net -540 ml   Filed Weights   09/27/20 0358 09/28/20 0615 09/30/20 0111  Weight: 78.8 kg 78.6 kg 74.7 kg    Examination:  General exam: Appears calm & comfortable  Respiratory system: decreased breath sounds b/l  Cardiovascular system:  S1/S2+. No clicks or rubs Gastrointestinal system: Abd is soft, NT, ND & hypoactive bowel sounds  Central  nervous system: Alert and oriented. Moves all extremities  Psychiatry: Judgement and insight appear normal. Flat mood and affect    Data Reviewed: I have personally reviewed following labs and imaging studies  CBC: Recent Labs  Lab 09/24/20 0530 09/25/20 0548 09/27/20 0555  WBC 7.8 9.0 10.1  HGB 12.4 12.7 12.7  HCT 37.3 38.6 38.6  MCV 89.7 89.8 90.4  PLT 263 289 528   Basic Metabolic Panel: Recent Labs  Lab 09/24/20 0530 09/25/20 0548 09/27/20 0555  NA 137 138 138  K 3.6 3.5 4.1  CL 102 103 103  CO2 $Re'25 27 27  'JhF$ GLUCOSE 150* 107* 100*  BUN 7* 9 9  CREATININE 0.74 0.75 0.78  CALCIUM 8.6* 8.7* 9.0   GFR: Estimated Creatinine Clearance: 58.8 mL/min (by C-G formula based on  SCr of 0.78 mg/dL). Liver Function Tests: No results for input(s): AST, ALT, ALKPHOS, BILITOT, PROT, ALBUMIN in the last 168 hours. No results for input(s): LIPASE, AMYLASE in the last 168 hours. No results for input(s): AMMONIA in the last 168 hours. Coagulation Profile: No results for input(s): INR, PROTIME in the last 168 hours. Cardiac Enzymes: No results for input(s): CKTOTAL, CKMB, CKMBINDEX, TROPONINI in the last 168 hours. BNP (last 3 results) No results for input(s): PROBNP in the last 8760 hours. HbA1C: No results for input(s): HGBA1C in the last 72 hours. CBG: Recent Labs  Lab 09/29/20 2009 09/30/20 0050 09/30/20 0418 09/30/20 0742 09/30/20 1125  GLUCAP 214* 99 143* 117* 141*   Lipid Profile: No results for input(s): CHOL, HDL, LDLCALC, TRIG, CHOLHDL, LDLDIRECT in the last 72 hours. Thyroid Function Tests: No results for input(s): TSH, T4TOTAL, FREET4, T3FREE, THYROIDAB in the last 72 hours. Anemia Panel: No results for input(s): VITAMINB12, FOLATE, FERRITIN, TIBC, IRON, RETICCTPCT in the last 72 hours. Sepsis Labs: No results for input(s): PROCALCITON, LATICACIDVEN in the last 168 hours.  Recent Results (from the past 240 hour(s))  Resp Panel by RT-PCR (Flu A&B, Covid) Nasopharyngeal Swab     Status: None   Collection Time: 09/22/20  4:25 PM   Specimen: Nasopharyngeal Swab; Nasopharyngeal(NP) swabs in vial transport medium  Result Value Ref Range Status   SARS Coronavirus 2 by RT PCR NEGATIVE NEGATIVE Final    Comment: (NOTE) SARS-CoV-2 target nucleic acids are NOT DETECTED.  The SARS-CoV-2 RNA is generally detectable in upper respiratory specimens during the acute phase of infection. The lowest concentration of SARS-CoV-2 viral copies this assay can detect is 138 copies/mL. A negative result does not preclude SARS-Cov-2 infection and should not be used as the sole basis for treatment or other patient management decisions. A negative result may occur with   improper specimen collection/handling, submission of specimen other than nasopharyngeal swab, presence of viral mutation(s) within the areas targeted by this assay, and inadequate number of viral copies(<138 copies/mL). A negative result must be combined with clinical observations, patient history, and epidemiological information. The expected result is Negative.  Fact Sheet for Patients:  EntrepreneurPulse.com.au  Fact Sheet for Healthcare Providers:  IncredibleEmployment.be  This test is no t yet approved or cleared by the [place] FDA and  has been authorized for detection and/or diagnosis of SARS-CoV-2 by FDA under an Emergency Use Authorization (EUA). This EUA will remain  in effect (meaning this test can be used) for the duration of the COVID-19 declaration under Section 564(b)(1) of the Act, 21 U.S.C.section 360bbb-3(b)(1), unless the authorization is terminated  or revoked sooner.       Influenza A  by PCR NEGATIVE NEGATIVE Final   Influenza B by PCR NEGATIVE NEGATIVE Final    Comment: (NOTE) The Xpert Xpress SARS-CoV-2/FLU/RSV plus assay is intended as an aid in the diagnosis of influenza from Nasopharyngeal swab specimens and should not be used as a sole basis for treatment. Nasal washings and aspirates are unacceptable for Xpert Xpress SARS-CoV-2/FLU/RSV testing.  Fact Sheet for Patients: EntrepreneurPulse.com.au  Fact Sheet for Healthcare Providers: IncredibleEmployment.be  This test is not yet approved or cleared by the [place] FDA and has been authorized for detection and/or diagnosis of SARS-CoV-2 by FDA under an Emergency Use Authorization (EUA). This EUA will remain in effect (meaning this test can be used) for the duration of the COVID-19 declaration under Section 564(b)(1) of the Act, 21 U.S.C. section 360bbb-3(b)(1), unless the authorization is terminated  or revoked.  Performed at [facility], [address] [place], [place] [address]          Radiology Studies: No results found.      Scheduled Meds: . chlorhexidine gluconate (MEDLINE KIT)  15 mL Mouth Rinse BID  . Chlorhexidine Gluconate Cloth  6 each Topical Q2200  . divalproex  500 mg Oral Q12H  . enoxaparin (LOVENOX) injection  40 mg Subcutaneous Q24H  . FLUoxetine  60 mg Oral Daily  . insulin aspart  0-15 Units Subcutaneous Q4H  . insulin glargine  6 Units Subcutaneous QHS  . levothyroxine  88 mcg Oral QAC breakfast  . lurasidone  20 mg Oral Daily  . pantoprazole  40 mg Oral Q24H  . thiamine  100 mg Oral Daily  . vitamin B-12  1,000 mcg Oral Daily   Continuous Infusions:    LOS: 15 days    Time spent: 20 mins    Max Sane, MD [facility] Pager [phone]  If 7PM-7AM, please contact night-coverage 09/30/2020, 2:22 PM

## 2020-09-30 NOTE — Progress Notes (Signed)
Occupational Therapy Treatment Patient Details Name: [name] MRN: [id number] DOB: [birth year] Today's Date: 09/30/2020    History of present illness Pt is a 75 y.o. female with medical history significant for type 2 diabetes mellitus, essential hypertension, acquired hypothyroidism, who is admitted to [facility] on 09/15/2020 with acute hypoxic respiratory failure. Extubated 09/17/2020.  MD assessment includes: Acute hypoxic respiratory failure secondary to aspiration with food removed from the lungs on vent on extubated, hypothyroidism, depression on numerous psychiatric medications, hypomagnesemia, and weakness.   OT comments  Pt seen for OT treatment this date to f/u re: safety with ADLs/ADL mobility. OT educates pt re: controlling descent to all seated surface for fall prevention. OT provides tactile/verbal cues to safely use 2WW including reaching back for arm rests. Pt is pretty extensively requiring posterior support in standing and with fxl mobility or transfer to prevent posterior LOB and demos poor awarness for potential balance loss. OT engages pt in commode transfer wtih MIN/MOD A and peri care with MAX A. Pt requires MOD A to perform fxl mobility from [facility] to chair as well as extended time and safety cues to take 6-7 small steps. Pt left in chair with chair alarm. All needs met and in reach. RN notified of pt status as well as BM during session. Will contin to follow acutely. As pt continues to require extensive assist for safety with ADLs/ADL mobility, continue to anticipate that STR f/u in SNF setting is most appropriate at this time.    Follow Up Recommendations  SNF    Equipment Recommendations  3 in 1 bedside commode    Recommendations for Other Services      Precautions / Restrictions Precautions Precautions: Fall Restrictions Weight Bearing Restrictions: No       Mobility Bed Mobility Overal bed mobility: Needs Assistance Bed Mobility:  Supine to Sit     Supine to sit: Mod assist;HOB elevated (~30-40 degrees)     General bed mobility comments: increased time, assist to eleavate trunk. Cues to sequence working LEs toward EOB.    Transfers Overall transfer level: Needs assistance Equipment used: Rolling walker (2 wheeled) Transfers: Sit to/from Stand Sit to Stand: Min assist;Mod assist Stand pivot transfers: Mod assist            Balance Overall balance assessment: Needs assistance   Sitting balance-Leahy Scale: Poor Sitting balance - Comments: pt with intermittent bots of slow posterior seated LOB, pt is typically able to self-correct, but is essentially unaware and requires verbal cues throughout to right herself and come to midline sitting. Postural control: Posterior lean Standing balance support: Bilateral upper extremity supported Standing balance-Leahy Scale: Poor Standing balance comment: requires MIN/MOD A to sustain static stand as well as B UE support and significant posterior support to prevent standing posterior LOB.                           ADL either performed or assessed with clinical judgement   ADL Overall ADL's : Needs assistance/impaired     Grooming: Wash/dry hands;Set up;Sitting;Cueing for sequencing               Lower Body Dressing: Moderate assistance;Maximal assistance;Sitting/lateral leans Lower Body Dressing Details (indicate cue type and reason): seated EOB to adjust socks Toilet Transfer: Minimal assistance;Moderate assistance;BSC;RW;Stand-pivot   Toileting- Clothing Manipulation and Hygiene: Maximal assistance;Sitting/lateral lean       Functional mobility during ADLs: Moderate assistance;Rolling walker;Cueing for sequencing;Cueing for  safety (to take 6-7 small steps from [facility] to chair in front of her including pivoting to face her posterior toward the chair. Increased time and cues as well as significant posterior support to prevent LOB)       Vision  Baseline Vision/History: Wears glasses Wears Glasses: At all times Patient Visual Report: No change from baseline     Perception     Praxis      Cognition Arousal/Alertness: Awake/alert Behavior During Therapy: WFL for tasks assessed/performed Overall Cognitive Status: Within Functional Limits for tasks assessed                                 General Comments: Pt oriented and pleasant, but does require increased processing time and cues.        Exercises Other Exercises Other Exercises: OT ed re: controlling descent to all seated surface for fall prevention. OT provides tactile/verbal cues to safely use 2WW including reaching back for arm rests. Pt is pretty extensively requiring posterior support in standing and with fxl mobility or transfer to prevent posterior LOB and demos poor awarness for potential balance loss. OT engages pt in commode transfer wtih MIN/MOD A and peri care with MAX A.   Shoulder Instructions       General Comments      Pertinent Vitals/ Pain       Pain Assessment: No/denies pain  Home Living                                          Prior Functioning/Environment              Frequency  Min 1X/week        Progress Toward Goals  OT Goals(current goals can now be found in the care plan section)  Progress towards OT goals: Progressing toward goals  Acute Rehab OT Goals Patient Stated Goal: to get stronger OT Goal Formulation: With patient Time For Goal Achievement: 10/12/20 Potential to Achieve Goals: Good  Plan Discharge plan remains appropriate    Co-evaluation                 AM-PAC OT "6 Clicks" Daily Activity     Outcome Measure   Help from another person eating meals?: None Help from another person taking care of personal grooming?: A Little Help from another person toileting, which includes using toliet, bedpan, or urinal?: A Lot Help from another person bathing (including washing,  rinsing, drying)?: A Lot Help from another person to put on and taking off regular upper body clothing?: A Little Help from another person to put on and taking off regular lower body clothing?: A Lot 6 Click Score: 16    End of Session Equipment Utilized During Treatment: Gait belt;Rolling walker  OT Visit Diagnosis: Unsteadiness on feet (R26.81);Muscle weakness (generalized) (M62.81)   Activity Tolerance Patient tolerated treatment well   Patient Left in chair;with call bell/phone within reach;with chair alarm set   Nurse Communication Mobility status        Time: 1129-1207 OT Time Calculation (min): 38 min  Charges: OT General Charges $OT Visit: 1 Visit OT Treatments $Self Care/Home Management : 23-37 mins $Therapeutic Activity: 8-22 mins  Gerrianne Scale, [place], OTR/L ascom [phone] 09/30/20, 2:49 PM

## 2020-09-30 NOTE — Plan of Care (Signed)
  Problem: Safety: Goal: Ability to remain free from injury will improve Outcome: Progressing   Problem: Skin Integrity: Goal: Risk for impaired skin integrity will decrease Outcome: Progressing   

## 2020-10-01 DIAGNOSIS — N179 Acute kidney failure, unspecified: Secondary | ICD-10-CM | POA: Diagnosis not present

## 2020-10-01 DIAGNOSIS — J9601 Acute respiratory failure with hypoxia: Secondary | ICD-10-CM | POA: Diagnosis not present

## 2020-10-01 DIAGNOSIS — F3112 Bipolar disorder, current episode manic without psychotic features, moderate: Secondary | ICD-10-CM | POA: Diagnosis not present

## 2020-10-01 LAB — GLUCOSE, CAPILLARY
Glucose-Capillary: 115 mg/dL — ABNORMAL HIGH (ref 70–99)
Glucose-Capillary: 141 mg/dL — ABNORMAL HIGH (ref 70–99)
Glucose-Capillary: 144 mg/dL — ABNORMAL HIGH (ref 70–99)
Glucose-Capillary: 152 mg/dL — ABNORMAL HIGH (ref 70–99)
Glucose-Capillary: 157 mg/dL — ABNORMAL HIGH (ref 70–99)
Glucose-Capillary: 271 mg/dL — ABNORMAL HIGH (ref 70–99)
Glucose-Capillary: 58 mg/dL — ABNORMAL LOW (ref 70–99)

## 2020-10-01 NOTE — Discharge Summary (Addendum)
[facility] [place] NAME: [name]    MR#:  [id number]  DATE OF BIRTH:  October 21, 1945  DATE OF ADMISSION:  09/15/2020   ADMITTING PHYSICIAN: Loletha Grayer, MD  DATE OF DISCHARGE: 10/02/2020  PRIMARY CARE PHYSICIAN: Mechele Claude, FNP   ADMISSION DIAGNOSIS:  Aspiration pneumonia ([place]) [J69.0] SOB (shortness of breath) [R06.02] Endotracheally intubated [Z97.8] Acute hypoxemic respiratory failure (HCC) [J96.01] DISCHARGE DIAGNOSIS:  Principal Problem:   Bipolar 1 disorder with moderate mania (HCC) Active Problems:   Essential hypertension   Hypothyroidism   Dyslipidemia   Type 2 diabetes mellitus with hyperlipidemia (HCC)   AKI (acute kidney injury) ([place])   Aspiration pneumonia (HCC)   Acute respiratory failure with hypoxia (HCC)   Acute hypoxemic respiratory failure (HCC)   SOB (shortness of breath)   Endotracheally intubated  SECONDARY DIAGNOSIS:   Past Medical History:  Diagnosis Date  . Arthritis   . Breast cancer ([place])   . Cancer [facility]) 2004   right breast ca  . Diabetes mellitus without complication ([place])   . Hypercholesterolemia   . Hypertension   . Hypothyroid   . Seasonal allergies    HOSPITAL COURSE:  75 y.o.femalewith medical history significant fortype 2 diabetes mellitus, essential hypertension, acquired hypothyroidism admitted for acute hypoxic respiratory failure due to suspected aspiration pneumoniaafter presenting from [facility] for evaluation of potential aspiration.  Acute hypoxic respiratory failure:secondary to aspiration of hot dog. S/p intubation & bronchoscopy. Extubated on 09/17/20. Now Resolved  - Her respiratory status rapidly deteriorated on admission where she required BiPAP ->Her oxygen requirements worsened requiring 100% on the BiPAP. She was intubated on 09/16/2020 and had a bronchoscopy which removed some food. She was extubated on 09/17/2020 and now on room air.  - She also had code stroke  called during hospitalization while she was down at swallow study. Seen by neurology. CT and MRI of the brain negative for acute stroke.  aspiration pneumonia: completed abx course. encourage incentive spirometry  Hypokalemia: resolved  Leukocytosis: resolved  Hypothyroidism: continue on home dose of levothyroxine   DM2: resume home regimen  Bipolar disorder: continue on home dose of fluoxetine, depakote, latuda.   Weakness: PT/OT recs SNF and has bed at [place]:  stable CONSULTS OBTAINED:  Treatment Team:  Ottie Glazier, MD [place], Madie Reno, MD DRUG ALLERGIES:   Allergies  Allergen Reactions  . Navane [Thiothixene] Other (See Comments)    Reaction:  Unknown  . Penicillins Rash    Has patient had a PCN reaction causing immediate rash, facial/tongue/throat swelling, SOB or lightheadedness with hypotension: No Has patient had a PCN reaction causing severe rash involving mucus membranes or skin necrosis: No Has patient had a PCN reaction that required hospitalization: No Has patient had a PCN reaction occurring within the last 10 years: No If all of the above answers are "NO", then may proceed with Cephalosporin use.  Has patient had a PCN reaction causing immediate rash, facial/tongue/throat swelling, SOB or lightheadedness with hypotension: No Has patient had a PCN reaction causing severe rash involving mucus membranes or skin necrosis: No Has patient had a PCN reaction that required hospitalization: No Has patient had a PCN reaction occurring within the last 10 years: No If all of the above answers are "NO", then may proceed with Cephalosporin use. Has patient had a PCN reaction causing immediate rash, facial/tongue/throat swelling, SOB or lightheadedness with hypotension: No Has patient had a PCN reaction causing severe rash involving mucus  membranes or skin necrosis: No Has patient had a PCN reaction that required hospitalization: No Has patient  had a PCN reaction occurring within the last 10 years: No If all of the above answers are "NO", then may proceed with Cephalosporin use.   DISCHARGE MEDICATIONS:   Allergies as of 10/01/2020      Reactions   Navane [thiothixene] Other (See Comments)   Reaction:  Unknown   Penicillins Rash   Has patient had a PCN reaction causing immediate rash, facial/tongue/throat swelling, SOB or lightheadedness with hypotension: No Has patient had a PCN reaction causing severe rash involving mucus membranes or skin necrosis: No Has patient had a PCN reaction that required hospitalization: No Has patient had a PCN reaction occurring within the last 10 years: No If all of the above answers are "NO", then may proceed with Cephalosporin use. Has patient had a PCN reaction causing immediate rash, facial/tongue/throat swelling, SOB or lightheadedness with hypotension: No Has patient had a PCN reaction causing severe rash involving mucus membranes or skin necrosis: No Has patient had a PCN reaction that required hospitalization: No Has patient had a PCN reaction occurring within the last 10 years: No If all of the above answers are "NO", then may proceed with Cephalosporin use. Has patient had a PCN reaction causing immediate rash, facial/tongue/throat swelling, SOB or lightheadedness with hypotension: No Has patient had a PCN reaction causing severe rash involving mucus membranes or skin necrosis: No Has patient had a PCN reaction that required hospitalization: No Has patient had a PCN reaction occurring within the last 10 years: No If all of the above answers are "NO", then may proceed with Cephalosporin use.      Medication List    TAKE these medications   acetaminophen 500 MG tablet Commonly known as: TYLENOL Take 500 mg by mouth every 6 (six) hours as needed for mild pain.   clonazePAM 1 MG tablet Commonly known as: KLONOPIN Take 1 tablet (1 mg total) by mouth 2 (two) times daily.   divalproex  500 MG 24 hr tablet Commonly known as: DEPAKOTE ER Take 2 tablets (1,000 mg total) by mouth at bedtime.   enalapril 5 MG tablet Commonly known as: VASOTEC Take 1 tablet (5 mg total) by mouth 2 (two) times daily. What changed: how much to take   FLUoxetine 20 MG capsule Commonly known as: PROZAC Take 60 mg by mouth daily.   insulin detemir 100 UNIT/ML injection Commonly known as: LEVEMIR Inject 0.15 mLs (15 Units total) into the skin daily.   levothyroxine 88 MCG tablet Commonly known as: SYNTHROID Take 88 mcg by mouth daily before breakfast.   linagliptin 5 MG Tabs tablet Commonly known as: TRADJENTA Take 5 mg by mouth daily.   loratadine 10 MG tablet Commonly known as: CLARITIN Take 1 tablet (10 mg total) by mouth daily.   lurasidone 20 MG Tabs tablet Commonly known as: LATUDA Take 20 mg by mouth daily.   metFORMIN 500 MG tablet Commonly known as: GLUCOPHAGE Take 2 tablets (1,000 mg total) by mouth 2 (two) times daily with a meal.   ondansetron 4 MG disintegrating tablet Commonly known as: ZOFRAN-ODT Take 4 mg by mouth every 6 (six) hours as needed for nausea.   rosuvastatin 40 MG tablet Commonly known as: CRESTOR Take 1 tablet (40 mg total) by mouth daily.            Durable Medical Equipment  (From admission, onward)  Start     Ordered   09/27/20 1129  For home use only DME Walker rolling  Once       Question Answer Comment  Walker: With 5 Inch Wheels   Patient needs a walker to treat with the following condition Weakness      09/27/20 1129         DISCHARGE INSTRUCTIONS:  Dysphagia 1 (puree);Thin liquid Liquids provided via: Straw Medication Administration: Crushed with puree Supervision: Patient able to self feed;Intermittent supervision to cue for compensatory strategies Compensations: Minimize environmental distractions;Slow rate;Small sips/bites Postural Changes and/or Swallow Maneuvers: Seated upright 90 degrees  DIET:  Cardiac  diet DISCHARGE CONDITION:  Stable ACTIVITY:  Activity as tolerated OXYGEN:  Home Oxygen: No.  Oxygen Delivery: room air DISCHARGE LOCATION:  nursing home with palliative care to follow  If you experience worsening of your admission symptoms, develop shortness of breath, life threatening emergency, suicidal or homicidal thoughts you must seek medical attention immediately by calling 911 or calling your MD immediately  if symptoms less severe.  You Must read complete instructions/literature along with all the possible adverse reactions/side effects for all the Medicines you take and that have been prescribed to you. Take any new Medicines after you have completely understood and accpet all the possible adverse reactions/side effects.   Please note  You were cared for by a hospitalist during your hospital stay. If you have any questions about your discharge medications or the care you received while you were in the hospital after you are discharged, you can call the unit and asked to speak with the hospitalist on call if the hospitalist that took care of you is not available. Once you are discharged, your primary care physician will handle any further medical issues. Please note that NO REFILLS for any discharge medications will be authorized once you are discharged, as it is imperative that you return to your primary care physician (or establish a relationship with a primary care physician if you do not have one) for your aftercare needs so that they can reassess your need for medications and monitor your lab values.    On the day of Discharge:  VITAL SIGNS:  Blood pressure 97/60, pulse 61, temperature 97.9 F (36.6 C), resp. rate 15, height 5\' 3"  (1.6 m), weight 74.7 kg, SpO2 100 %. PHYSICAL EXAMINATION:  GENERAL:  76 y.o.-year-old patient lying in the bed with no acute distress.  EYES: Pupils equal, round, reactive to light and accommodation. No scleral icterus. Extraocular muscles intact.   HEENT: Head atraumatic, normocephalic. Oropharynx and nasopharynx clear.  NECK:  Supple, no jugular venous distention. No thyroid enlargement, no tenderness.  LUNGS: Normal breath sounds bilaterally, no wheezing, rales,rhonchi or crepitation. No use of accessory muscles of respiration.  CARDIOVASCULAR: S1, S2 normal. No murmurs, rubs, or gallops.  ABDOMEN: Soft, non-tender, non-distended. Bowel sounds present. No organomegaly or mass.  EXTREMITIES: No pedal edema, cyanosis, or clubbing.  NEUROLOGIC: Cranial nerves II through XII are intact. Muscle strength 5/5 in all extremities. Sensation intact. Gait not checked.  PSYCHIATRIC: The patient is alert and oriented x 3.  SKIN: No obvious rash, lesion, or ulcer.  DATA REVIEW:   CBC Recent Labs  Lab 09/27/20 0555  WBC 10.1  HGB 12.7  HCT 38.6  PLT 338    Chemistries  Recent Labs  Lab 09/27/20 0555  NA 138  K 4.1  CL 103  CO2 27  GLUCOSE 100*  BUN 9  CREATININE 0.78  CALCIUM 9.0  Outpatient follow-up  Contact information for follow-up providers    Mechele Claude, FNP. Schedule an appointment as soon as possible for a visit in 1 week(s).   Specialty: Family Medicine Contact information: [place] [address] [phone]            Contact information for after-discharge care    [facility] .   Service: Skilled Nursing Contact information: [place] [phone]                  30 Day Unplanned Readmission Risk Score   Flowsheet Row ED to Hosp-Admission (Current) from 09/15/2020 in [place] (1A)  30 Day Unplanned Readmission Risk Score (%) 25.18 Filed at 10/01/2020 1600     This score is the patient's risk of an unplanned readmission within 30 days of being discharged (0 -100%). The score is based on dignosis, age, lab data, medications, orders, and past utilization.    Low:  0-14.9   Medium: 15-21.9   High: 22-29.9   Extreme: 30 and above         Management plans discussed with the patient, family and they are in agreement.  CODE STATUS: Full Code   TOTAL TIME TAKING CARE OF THIS PATIENT: 45 minutes.    Max Sane M.D on 10/01/2020 at 7:30 PM  [facility]   CC: Primary care physician; Mechele Claude, FNP   Note: This dictation was prepared with Dragon dictation along with smaller phrase technology. Any transcriptional errors that result from this process are unintentional.

## 2020-10-01 NOTE — Progress Notes (Signed)
PROGRESS NOTE   HPI was taken from Dr. Velia Meyer: [name] is a 75 y.o. female with medical history significant for type 2 diabetes mellitus, essential hypertension, acquired hypothyroidism, who is admitted to [facility] on 09/15/2020 with acute hypoxic respiratory failure due to suspected aspiration pneumonia after presenting from SNF to [facility] ED for evaluation of potential aspiration.   Bilirubin this evening, the patient was witnessed by the SNF staff to start choking and coughing on her dinner, which included a hotdog.  The patient reportedly continued a significant continuous coughing for approximately 30 minutes, during which staff noted the patient to cough up several bits of her dinner, including hotdog.  After this timeframe, staff contacted EMS, who noted the patient's initial oxygen saturation to be in the low 80s on room air, prompting initiation of nonrebreather for bringing the patient to [facility] ED for further evaluation.  No known baseline supplemental oxygen requirements, and no known underlying chronic pulmonary pathology.   Per my discussion with the patient, she reports mild shortness of breath and continued coughing.  Denies any associated subjective fever, chills, rigors, or generalized myalgias.  She also denies any chest pains, palpitations, diaphoresis, nausea, vomiting.  Not associate with any wheezing or hemoptysis.  Denies any associated orthopnea, PND, or new onset peripheral edema. It is unclear if the patient has any known underlying history of dysphagia or requirements for modified diet.     ED Course:  Vital signs in the ED were notable for the following: Temperature max 97.5, heart rate 95-96; initial respiratory rate noted to be 24, with subsequent improvement into the range of 14-15; blood pressure 104/72-112/78; supplemental oxygen delivery has been transitioned from nonrebreather to 6 L nasal cannula, upon which the patient  has been maintaining oxygen saturations in the range of 94 to 98%.  Labs were notable for the following: CMP was notable for the following: Sodium 135, bicarbonate 23, BUN 27, creatinine 1.28 relative to most recent prior creatinine data point of 1.03 on 08/24/2020.  CBC notable for white blood cell count of 15,000.  Nasopharyngeal COVID-19/influenza PCR were performed in the ED today, and found to be negative.  Chest x-ray showed no evidence of acute cardiopulmonary process.  The EDP discussed the patient's case with the on-call critical care physician, Dr. Keenan Bachelor, who recommended admission to the hospitalistservice, with plan for critical care service to follow along,with anticipated bronchoscopy tomorrow(09/16/20)along with interval antibiotics for suspected aspiration pneumonia, including specific recommendation for meropenem in the setting of reported penicillin allergy.  While in the ED, the following were administered: Meropenem 1 g IV x1.  Subsequently, the patient was admitted to the PCU for further evaluation and management of presenting acute hypoxic respiratory failure in the setting of suspected aspiration pneumonia.    As per Dr. Leslye Peer: Patient was admitted 09/15/2020 with aspiration of hotdog.  Past medical history of hypothyroidism, type 2 diabetes mellitus, and bipolar disorder.  Her respiratory status rapidly deteriorated where she required BiPAP and changed to stepdown admission.  Her oxygen requirements worsened requiring 100% on the BiPAP.  She was intubated on 09/16/2020 and had a bronchoscopy which removed some food.  She was extubated on 09/17/2020.  She is currently on nasal cannula 4 L.  There was a code stroke called yesterday while she was down at swallow study.  Seen by neurology.  CT and MRI of the brain negative for acute stroke.  Hospital course from Dr. Jimmye Norman 5/4-5/10/22: Pt was found to have  respiratory failure and pneumonia. Pt was weaned from supplemental  oxygen prior to d/c and has completed the abx course from pneumonia. PT/OT recommended SNF but pt's insurance denied it. called on 09/22/20 but unable to reach appropriate doc for peer to peer, CM notified and was given another number for which the company said that peer to peer was closed now despite calling w/in 1 hour of being told there was a requested peer to peer review. CM is aware and working on this     [name]  BSJ:[id number] DOB: 08-24-1945 DOA: 09/15/2020 PCP: Mechele Claude, FNP   Assessment & Plan:   Principal Problem:   Bipolar 1 disorder with moderate mania ([place]) Active Problems:   Essential hypertension   Hypothyroidism   Dyslipidemia   Type 2 diabetes mellitus with hyperlipidemia ([place])   AKI (acute kidney injury) ([place])   Aspiration pneumonia ([place])   Acute respiratory failure with hypoxia (HCC)   Acute hypoxemic respiratory failure (HCC)   SOB (shortness of breath)   Endotracheally intubated   Acute hypoxic respiratory failure: likely secondary to aspiration. S/p intubation & bronchoscopy. Extubated on 09/17/20. Resolved   Likely aspiration pneumonia: completed abx course. Continue on bronchodilators and encourage incentive spirometry  Hypokalemia: within normal limits  Leukocytosis: resolved  Hypothyroidism: continue on home dose of levothyroxine   DM2: likely poorly controlled. Continue on lantus, SSI w/ accuchecks   Bipolar disorder: continue on home dose of fluoxetine, depakote, latuda. Haldol prn   Weakness: PT/OT recs SNF.  Insurance approved but waiting on facility/ healthcare was checking for room availability.   DVT prophylaxis: lovenox  Code Status: full  Family Communication: updated Del at [phone] on 5/13 Disposition Plan: likely d/c to SNF tomorrow.  Waiting on facility to confirm room availability.  Insurance has approved it today.  I will order COVID test today required for placement   Level of care: Med-Surg    Status is: Inpatient  Remains inpatient appropriate because:Unsafe d/c plan and Inpatient level of care appropriate due to severity of illness. Pt's insurance approved and waiting on facility to confirm bed availability   Dispo: The patient is from: Home              Anticipated d/c is to: SNF               Patient currently is not medically stable to d/c.  She remains very weak   Difficult to place patient Yes   Consultants:   Psych     Subjective: Remains same.  Sleepy today.  Objective: Vitals:   09/30/20 2059 10/01/20 0355 10/01/20 0801 10/01/20 1239  BP: 124/60 111/84 117/66 106/72  Pulse: 61 (!) 56 (!) 59 74  Resp: $Remo'16 18 17 17  'MxuVj$ Temp: 99 F (37.2 C) 98.1 F (36.7 C) 97.7 F (36.5 C) 97.9 F (36.6 C)  TempSrc: Oral Oral    SpO2: 94% 95% 95% 100%  Weight:      Height:        Intake/Output Summary (Last 24 hours) at 10/01/2020 1322 Last data filed at 10/01/2020 1013 Gross per 24 hour  Intake 540 ml  Output 350 ml  Net 190 ml   Filed Weights   09/27/20 0358 09/28/20 0615 09/30/20 0111  Weight: 78.8 kg 78.6 kg 74.7 kg    Examination:  General exam: Appears calm & comfortable  Respiratory system: decreased breath sounds b/l  Cardiovascular system:  S1/S2+. No clicks or rubs Gastrointestinal system: Abd is soft, NT,  ND & hypoactive bowel sounds  Central nervous system: Sleepy. Moves all extremities  Psychiatry: Sleepy    Data Reviewed: I have personally reviewed following labs and imaging studies  CBC: Recent Labs  Lab 09/25/20 0548 09/27/20 0555  WBC 9.0 10.1  HGB 12.7 12.7  HCT 38.6 38.6  MCV 89.8 90.4  PLT 289 671   Basic Metabolic Panel: Recent Labs  Lab 09/25/20 0548 09/27/20 0555  NA 138 138  K 3.5 4.1  CL 103 103  CO2 27 27  GLUCOSE 107* 100*  BUN 9 9  CREATININE 0.75 0.78  CALCIUM 8.7* 9.0   GFR: Estimated Creatinine Clearance: 58.8 mL/min (by C-G formula based on SCr of 0.78 mg/dL). Liver Function Tests: No results for  input(s): AST, ALT, ALKPHOS, BILITOT, PROT, ALBUMIN in the last 168 hours. No results for input(s): LIPASE, AMYLASE in the last 168 hours. No results for input(s): AMMONIA in the last 168 hours. Coagulation Profile: No results for input(s): INR, PROTIME in the last 168 hours. Cardiac Enzymes: No results for input(s): CKTOTAL, CKMB, CKMBINDEX, TROPONINI in the last 168 hours. BNP (last 3 results) No results for input(s): PROBNP in the last 8760 hours. HbA1C: No results for input(s): HGBA1C in the last 72 hours. CBG: Recent Labs  Lab 09/30/20 2105 10/01/20 0028 10/01/20 0354 10/01/20 0806 10/01/20 1159  GLUCAP 201* 144* 115* 157* 152*   Lipid Profile: No results for input(s): CHOL, HDL, LDLCALC, TRIG, CHOLHDL, LDLDIRECT in the last 72 hours. Thyroid Function Tests: No results for input(s): TSH, T4TOTAL, FREET4, T3FREE, THYROIDAB in the last 72 hours. Anemia Panel: No results for input(s): VITAMINB12, FOLATE, FERRITIN, TIBC, IRON, RETICCTPCT in the last 72 hours. Sepsis Labs: No results for input(s): PROCALCITON, LATICACIDVEN in the last 168 hours.  Recent Results (from the past 240 hour(s))  Resp Panel by RT-PCR (Flu A&B, Covid) Nasopharyngeal Swab     Status: None   Collection Time: 09/22/20  4:25 PM   Specimen: Nasopharyngeal Swab; Nasopharyngeal(NP) swabs in vial transport medium  Result Value Ref Range Status   SARS Coronavirus 2 by RT PCR NEGATIVE NEGATIVE Final    Comment: (NOTE) SARS-CoV-2 target nucleic acids are NOT DETECTED.  The SARS-CoV-2 RNA is generally detectable in upper respiratory specimens during the acute phase of infection. The lowest concentration of SARS-CoV-2 viral copies this assay can detect is 138 copies/mL. A negative result does not preclude SARS-Cov-2 infection and should not be used as the sole basis for treatment or other patient management decisions. A negative result may occur with  improper specimen collection/handling, submission of  specimen other than nasopharyngeal swab, presence of viral mutation(s) within the areas targeted by this assay, and inadequate number of viral copies(<138 copies/mL). A negative result must be combined with clinical observations, patient history, and epidemiological information. The expected result is Negative.  Fact Sheet for Patients:  EntrepreneurPulse.com.au  Fact Sheet for Healthcare Providers:  IncredibleEmployment.be  This test is no t yet approved or cleared by the [place] FDA and  has been authorized for detection and/or diagnosis of SARS-CoV-2 by FDA under an Emergency Use Authorization (EUA). This EUA will remain  in effect (meaning this test can be used) for the duration of the COVID-19 declaration under Section 564(b)(1) of the Act, 21 U.S.C.section 360bbb-3(b)(1), unless the authorization is terminated  or revoked sooner.       Influenza A by PCR NEGATIVE NEGATIVE Final   Influenza B by PCR NEGATIVE NEGATIVE Final    Comment: (NOTE) The  Xpert Xpress SARS-CoV-2/FLU/RSV plus assay is intended as an aid in the diagnosis of influenza from Nasopharyngeal swab specimens and should not be used as a sole basis for treatment. Nasal washings and aspirates are unacceptable for Xpert Xpress SARS-CoV-2/FLU/RSV testing.  Fact Sheet for Patients: EntrepreneurPulse.com.au  Fact Sheet for Healthcare Providers: IncredibleEmployment.be  This test is not yet approved or cleared by the [place] FDA and has been authorized for detection and/or diagnosis of SARS-CoV-2 by FDA under an Emergency Use Authorization (EUA). This EUA will remain in effect (meaning this test can be used) for the duration of the COVID-19 declaration under Section 564(b)(1) of the Act, 21 U.S.C. section 360bbb-3(b)(1), unless the authorization is terminated or revoked.  Performed at [facility], [address], [place],  [address]          Radiology Studies: No results found.      Scheduled Meds: . chlorhexidine gluconate (MEDLINE KIT)  15 mL Mouth Rinse BID  . Chlorhexidine Gluconate Cloth  6 each Topical Q2200  . divalproex  500 mg Oral Q12H  . enoxaparin (LOVENOX) injection  40 mg Subcutaneous Q24H  . FLUoxetine  60 mg Oral Daily  . insulin aspart  0-15 Units Subcutaneous Q4H  . insulin glargine  6 Units Subcutaneous QHS  . levothyroxine  88 mcg Oral QAC breakfast  . lurasidone  20 mg Oral Daily  . pantoprazole  40 mg Oral Q24H  . thiamine  100 mg Oral Daily  . vitamin B-12  1,000 mcg Oral Daily   Continuous Infusions:    LOS: 16 days    Time spent: 20 mins    Max Sane, MD [facility] Pager [phone]  If 7PM-7AM, please contact night-coverage 10/01/2020, 1:22 PM

## 2020-10-01 NOTE — TOC Progression Note (Signed)
Transition of Care [facility]) - Progression Note    Patient Details  Name: [name] MRN: [id number] Date of Birth: [birth year]  [facility] [place] [facility]) CM/SW Contact  Boris Sharper, [place] Phone Number: 10/01/2020, 3:22 PM  Clinical Narrative:    Plan for discharge on Monday 5/16. SNF request approved reference number [id number] with review date of 10/05/20. CSW requested COVID screening for pt to discharge. CSW notified Lavella Lemons and [facility] is expecting pt on Monday. [place] to get updated PASRR.   Expected Discharge Plan: Long Term Nursing Home Barriers to Discharge: Continued Medical Work up  Expected Discharge Plan and Services Expected Discharge Plan: [place] In-house Referral: Clinical Social Work   Post Acute Care Choice: Nursing Home ([place]) Living arrangements for the past 2 months: [place] Expected Discharge Date: 09/27/20                                     Social Determinants of Health (SDOH) Interventions    Readmission Risk Interventions Readmission Risk Prevention Plan 08/24/2020  PCP or Specialist Appt within 5-7 Days Complete  Medication Review (RN CM) Complete  Some recent data might be hidden

## 2020-10-01 NOTE — Progress Notes (Signed)
Physical Therapy Treatment Patient Details Name: [name] MRN: [id number] DOB: Sep 09, 1945 Today's Date: 10/01/2020    History of Present Illness Pt is a 75 y.o. female with medical history significant for type 2 diabetes mellitus, essential hypertension, acquired hypothyroidism, who is admitted to [facility] on 09/15/2020 with acute hypoxic respiratory failure. Extubated 09/17/2020.  MD assessment includes: Acute hypoxic respiratory failure secondary to aspiration with food removed from the lungs on vent on extubated, hypothyroidism, depression on numerous psychiatric medications, hypomagnesemia, and weakness.    PT Comments    Ready for session.  Participated in exercises as described below.  During ex stated she needed to use commode.  Upon initiating removal of pur wic she promptly begins to urinate and it is left in place.  Stated she did not need to use commode anymore.  "It came quickly."  Once finished she is assisted to EOB with mod a x 1.  Increased time and verbal/tactile cues to transition but does need significant help to fully transition.  Steady in sitting.  Stands with mod a x 1 and despite significant posterior lean she begins to transition to chair.  Mod assist to remain upright and cues to correct.  Once turned she stated she knew she was falling backwards but decided to turn anyway.  Safety education provided.  Standing x 2 at chairside with emphasis on positional awareness and self correction.  She does well with cues and is able to self correct a times while doing standing static and dynamic activities.  Reported fatigue.  Once positioned in chair she stated she needed to have a BM.  Commode brought and she stood and promptly had BM on floor.  Large soft.  To commode for care.  Unable to participate in self care due to need for BUE support on walker.  Returned to recliner and needs met.  She continues to demonstrate decreased strength, safety and  judgement.  SNF remains appropriate for discharge.   Follow Up Recommendations  SNF;Supervision/Assistance - 24 hour     Equipment Recommendations       Recommendations for Other Services       Precautions / Restrictions Precautions Precautions: Fall Restrictions Weight Bearing Restrictions: No    Mobility  Bed Mobility Overal bed mobility: Needs Assistance Bed Mobility: Supine to Sit     Supine to sit: Mod assist     General bed mobility comments: increased time, assist to eleavate trunk. Cues to sequence working LEs toward EOB.    Transfers Overall transfer level: Needs assistance Equipment used: Rolling walker (2 wheeled) Transfers: Sit to/from Stand Sit to Stand: Mod assist            Ambulation/Gait Ambulation/Gait assistance: Mod assist;Min assist Gait Distance (Feet): 3 Feet Assistive device: Rolling walker (2 wheeled) Gait Pattern/deviations: Step-through pattern;Decreased step length - right;Decreased step length - left;Leaning posteriorly Gait velocity: decreased   General Gait Details: poor awareness of post lean and correction.   Stairs             Wheelchair Mobility    Modified Rankin (Stroke Patients Only)       Balance   Sitting-balance support: Feet supported Sitting balance-Leahy Scale: Fair   Postural control: Posterior lean Standing balance support: Bilateral upper extremity supported Standing balance-Leahy Scale: Poor Standing balance comment: min./mod at all times due to post lean  Cognition Arousal/Alertness: Awake/alert Behavior During Therapy: WFL for tasks assessed/performed Overall Cognitive Status: Within Functional Limits for tasks assessed                                        Exercises Other Exercises Other Exercises: supine AAORM x 10, transfer to commode    General Comments        Pertinent Vitals/Pain Pain Assessment: No/denies pain     Home Living                      Prior Function            PT Goals (current goals can now be found in the care plan section) Progress towards PT goals: Progressing toward goals    Frequency    Min 2X/week      PT Plan Current plan remains appropriate    Co-evaluation              AM-PAC PT "6 Clicks" Mobility   Outcome Measure  Help needed turning from your back to your side while in a flat bed without using bedrails?: A Lot Help needed moving from lying on your back to sitting on the side of a flat bed without using bedrails?: A Lot Help needed moving to and from a bed to a chair (including a wheelchair)?: A Lot Help needed standing up from a chair using your arms (e.g., wheelchair or bedside chair)?: A Lot Help needed to walk in hospital room?: A Lot Help needed climbing 3-5 steps with a railing? : Total 6 Click Score: 11    End of Session Equipment Utilized During Treatment: Gait belt Activity Tolerance: Patient tolerated treatment well Patient left: in chair;with call bell/phone within reach;with chair alarm set Nurse Communication: Mobility status PT Visit Diagnosis: Difficulty in walking, not elsewhere classified (R26.2);Muscle weakness (generalized) (M62.81)     Time: 7253-6644 PT Time Calculation (min) (ACUTE ONLY): 29 min  Charges:  $Therapeutic Exercise: 8-22 mins $Therapeutic Activity: 8-22 mins                    Chesley Noon, PTA 10/01/20, 12:42 PM

## 2020-10-02 DIAGNOSIS — N183 Chronic kidney disease, stage 3 unspecified: Secondary | ICD-10-CM | POA: Diagnosis not present

## 2020-10-02 DIAGNOSIS — J9601 Acute respiratory failure with hypoxia: Secondary | ICD-10-CM | POA: Diagnosis not present

## 2020-10-02 DIAGNOSIS — R5381 Other malaise: Secondary | ICD-10-CM | POA: Diagnosis not present

## 2020-10-02 DIAGNOSIS — E039 Hypothyroidism, unspecified: Secondary | ICD-10-CM | POA: Diagnosis not present

## 2020-10-02 DIAGNOSIS — N390 Urinary tract infection, site not specified: Secondary | ICD-10-CM | POA: Diagnosis not present

## 2020-10-02 DIAGNOSIS — E785 Hyperlipidemia, unspecified: Secondary | ICD-10-CM | POA: Diagnosis not present

## 2020-10-02 DIAGNOSIS — M6281 Muscle weakness (generalized): Secondary | ICD-10-CM | POA: Diagnosis not present

## 2020-10-02 DIAGNOSIS — R0602 Shortness of breath: Secondary | ICD-10-CM | POA: Diagnosis not present

## 2020-10-02 DIAGNOSIS — I10 Essential (primary) hypertension: Secondary | ICD-10-CM | POA: Diagnosis not present

## 2020-10-02 DIAGNOSIS — Z1159 Encounter for screening for other viral diseases: Secondary | ICD-10-CM | POA: Diagnosis not present

## 2020-10-02 DIAGNOSIS — Z515 Encounter for palliative care: Secondary | ICD-10-CM | POA: Diagnosis not present

## 2020-10-02 DIAGNOSIS — N179 Acute kidney failure, unspecified: Secondary | ICD-10-CM | POA: Diagnosis not present

## 2020-10-02 DIAGNOSIS — J302 Other seasonal allergic rhinitis: Secondary | ICD-10-CM | POA: Diagnosis not present

## 2020-10-02 DIAGNOSIS — E119 Type 2 diabetes mellitus without complications: Secondary | ICD-10-CM | POA: Diagnosis not present

## 2020-10-02 DIAGNOSIS — R279 Unspecified lack of coordination: Secondary | ICD-10-CM | POA: Diagnosis not present

## 2020-10-02 LAB — GLUCOSE, CAPILLARY
Glucose-Capillary: 105 mg/dL — ABNORMAL HIGH (ref 70–99)
Glucose-Capillary: 127 mg/dL — ABNORMAL HIGH (ref 70–99)
Glucose-Capillary: 138 mg/dL — ABNORMAL HIGH (ref 70–99)
Glucose-Capillary: 145 mg/dL — ABNORMAL HIGH (ref 70–99)

## 2020-10-02 LAB — VITAMIN B1: Vitamin B1 (Thiamine): 131.3 nmol/L (ref 66.5–200.0)

## 2020-10-02 LAB — SARS CORONAVIRUS 2 (TAT 6-24 HRS): SARS Coronavirus 2: NEGATIVE

## 2020-10-02 MED ORDER — CLONAZEPAM 1 MG PO TABS: 1.0000 mg | ORAL_TABLET | Freq: Two times a day (BID) | ORAL | 0 refills | Status: DC

## 2020-10-02 NOTE — Progress Notes (Signed)
To whom it may concern, the above mention patient is said to need short term rehabilitation for strengthening and endurance it is believed that it will be 30 days or less and the plan is for the patient to return to her home.

## 2020-10-02 NOTE — Care Management Important Message (Signed)
Important Message  Patient Details  Name: [name] MRN: [id number] Date of Birth: 12-21-1945   Medicare Important Message Given:  Yes     [name] 10/02/2020, 10:13 AM

## 2020-10-02 NOTE — TOC Progression Note (Signed)
Transition of Care [facility]) - Progression Note    Patient Details  Name: [name] MRN: [id number] Date of Birth: February 12, 1946  Transition of Care [facility]) CM/SW [place], RN Phone Number: 10/02/2020, 12:11 PM  Clinical Narrative:    Patient to go to room 93 at [facility] today and be transported by [facility] at 2 PM, uploaded all clinical documentation to [facility] MUST for PASSR to be updated, Nurse to call report to [phone]  Expected Discharge Plan: Long Term Nursing Home Barriers to Discharge: Continued Medical Work up  Expected Discharge Plan and Services Expected Discharge Plan: [place] In-house Referral: Clinical Social Work   Post Acute Care Choice: Nursing Home ([place]) Living arrangements for the past 2 months: [place] Expected Discharge Date: 10/02/20                                     Social Determinants of Health (SDOH) Interventions    Readmission Risk Interventions Readmission Risk Prevention Plan 08/24/2020  PCP or Specialist Appt within 5-7 Days Complete  Medication Review (RN CM) Complete  Some recent data might be hidden

## 2020-10-02 NOTE — Progress Notes (Signed)
Attempted to call report x2 , no answer, [phone]

## 2020-10-09 ENCOUNTER — Non-Acute Institutional Stay: Payer: Medicare Other | Admitting: Nurse Practitioner

## 2020-10-09 ENCOUNTER — Other Ambulatory Visit: Payer: Self-pay

## 2020-10-09 ENCOUNTER — Encounter: Payer: Self-pay | Admitting: Nurse Practitioner

## 2020-10-09 VITALS — BP 114/56 | HR 60 | Temp 98.3°F | Resp 18 | Wt 156.0 lb

## 2020-10-09 DIAGNOSIS — R5381 Other malaise: Secondary | ICD-10-CM | POA: Diagnosis not present

## 2020-10-09 DIAGNOSIS — Z515 Encounter for palliative care: Secondary | ICD-10-CM

## 2020-10-09 NOTE — Progress Notes (Signed)
[facility] Pall[facility] Consult Note Telephone: [phone]  Fax: [phone]    Date of encounter: 10/09/20 PATIENT NAME: [name] [address] [place] [address]   [phone] (home)  DOB: 12/20/1945 MRN: [id number] PRIMARY CARE PROVIDER:   Dr [facility] [name], [place],  [address] [place] [address] [phone]  RESPONSIBLE PARTY:    Contact Information    Name Relation Home Work [place] ,[place] Other   [phone]   Dian Situ   [phone]     I met face to face with patient facility. Palliative Care was asked to follow this patient by consultation request of  Dr Reesa Chew to address advance care planning and complex medical decision making. This is the initial visit ASSESSMENT AND PLAN / RECOMMENDATIONS:   Advance Care Planning/Goals of Care: Goals include to maximize quality of life and symptom management. Our advance care planning conversation included a discussion about:     The value and importance of advance care planning   Experiences with loved ones who have been seriously ill or have died   Exploration of personal, cultural or spiritual beliefs that might influence medical decisions   Exploration of goals of care in the event of a sudden injury or illness   Identification and preparation of a healthcare agent   Review and updating or creation of an  advance directive document .  Decision not to resuscitate or to de-escalate disease focused treatments due to poor prognosis.  CODE STATUS: Full code  Symptom Management/Plan: 1. ACP Full code per [name] wishes, will continue discussions  2. Debility, encourage mobility, continue to work with therapy, fall precautions  3. Goals of Care: Goals include to maximize quality of life and symptom management. Our advance care planning conversation included a discussion about:     The value and  importance of advance care planning   Exploration of personal, cultural or spiritual beliefs that might influence medical decisions   Exploration of goals of care in the event of a sudden injury or illness   Identification and preparation of a healthcare agent   Review and updating or creation of an advance directive document.  4. Palliative care encounter; Palliative care encounter; Palliative medicine team will continue to support patient, patient's family, and medical team. Visit consisted of counseling and education dealing with the complex and emotionally intense issues of symptom management and palliative care in the setting of serious and potentially life-threatening illness  Follow up Palliative Care Visit: Palliative care will continue to follow for complex medical decision making, advance care planning, and clarification of goals. Return 1 weeks or prn.  I spent 60 minutes providing this consultation. More than 50% of the time in this consultation was spent in counseling and care coordination.  PPS: 40%  HOSPICE ELIGIBILITY/DIAGNOSIS: TBD  Chief Complaint: Initial Palliative consult for complex medical decision making   HISTORY OF PRESENT ILLNESS:  [name] is a 75 y.o. year old female  with multiple medical problems including hypertension, diabetes, hypothyroidism, breast cancer, bipolar disorder. [name] was hospitalized 07/24/2020 to 08/02/2020 for weakness, falling at home, found to be in DKA requiring insulin drip transitioned to sq with metformin, detemir, glipizide, tradjenta. Dscharged to STR then home. Re-hospitalized 08/21/2020 to 08/24/2020 for being too weak to get up after being home for 1 day. Urine culture grew E. Coli, electrolyte abnormalities including hypokalemia and hypomagnesemia along with AKI which  was resolved with repletion of fluid and electrolytes. Hospitalized 09/15/2020 to 10/02/2020 for acute hypoxemic respiratory failure with aspiration pna after  choking on a hot dog. [name] under went s/p intubation &bronchoscopy. Extubated on 09/17/20, resolved. She was d/c to [facility] Term Rehab at [facility] where she currently resides. [name] does requires assistance for mobility, transfers, bathing, dressing, toileting incontinence of bowel/bladder due to weakness, deconditioning. Staff endorses [name] does feed herself with decreased appetite. [name] has been cooperative with staff. At present time [name] is lying in bed. [name] appears weak, comfortable. No visitors present. I visited and observed [name]. [name] and I talked about purpose of PC visit, [name] in agreement. We talked about how [name] has been feeling. [name] endorses she is tired. She did work with therapy last week and became very excited about getting to exercise. [name] endorses therapy has not come in as of yet today. We talked about symptoms of pain, shortness of breath which she denies. We talked at length about her appetite being poor. [name] endorses she does not eat what she does not know what it is. We talked about asking for a supplement if the meal served does is not appealing. [name] endorses she will try that next time, she did not think of that. We talked about living independently at home. We talked about recent multiple hospitalizations. Life review completed: divorced and has 2 children. [name] her son lives in [place], [place]. [name] has not seen her son in over 48 years. [name], daughter lves in [place], not seen each other in years. Ms [place] states she stopped high school in 11th grade. 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 She is originally from [place], [place]. We talked about her wishes to return though [name] acknowledges her weakness, unable to walk, and barriers to going home. We talked more about mobility, therapy. We talked about medical goals of care including aggressive,  conservative, comfort care. A detailed discussion was had today regarding advanced directives. We talked about code status, currently a full code and she confirmed wishes to remain full code. We talked about role of PC in poc. We discussed Her current illness and what it means in the larger context of Her on-going co-morbidities. Natural disease trajectory and expectations at EOL were discussed. I discussed the importance of continued conversation with Ms. Kovich and their medical providers regarding overall plan of care and treatment options, ensuring decisions are within the context of the patients values and GOCs. We talked about role PC in poc. We talked about f/u PC visit 1 week or sooner if declines. Praised Ms. Weld for trying therapy, trying to manage appetite, blood sugars. Therapeutic listening, emotional support provided. Ms. Galyean thanked NP for visit/discussion. I updated staff, no changes at present time to poc.  History obtained from review of EMR, discussion with interview with  facility staff and Ms. Matthies.  I reviewed available labs, medications, imaging, studies and related documents from the EMR.  Records reviewed and summarized above.   ROS Full 14 system review of systems performed and negative with exception of: as per HPI.  Physical Exam: Constitutional: NAD General: frail appearing, pleasant weak, female EYES: lids intact ENMT: intact hearing, oral mucous membranes moist, dentition intact CV: S1S2, RRR Pulmonary: LCTA, no increased work of breathing, no cough, room air Abdomen:  normo-active BS + 4 quadrants, soft and non tender GU: deferred MSK:  steps with assistance/walker Skin: warm and dry, no rashes or wounds on visible skin Neuro:  + generalized weakness,  no cognitive impairment Psych: non-anxious affect, A and O x 3  CURRENT PROBLEM LIST:  Patient Active Problem List   Diagnosis Date Noted  . SOB (shortness of breath)   . Endotracheally intubated   .  Acute respiratory failure with hypoxia ([place]) 09/16/2020  . Acute hypoxemic respiratory failure ([place]) 09/16/2020  . Hypomagnesemia   . Aspiration pneumonia ([place]) 09/15/2020  . Generalized weakness 08/22/2020  . UTI (urinary tract infection) 08/22/2020  . Unable to care for self 08/22/2020  . Bipolar 1 disorder ([place]) 08/22/2020  . Weakness 08/22/2020  . AKI (acute kidney injury) ([place]) 08/22/2020  . Bipolar disorder current episode depressed ([place]) 01/07/2020  . MDD (major depressive disorder) 08/10/2019  . MDD (major depressive disorder), recurrent episode, severe ([place]) 06/24/2019  . Chronic kidney disease, stage 3 unspecified ([employer]) 05/11/2019  . Pain due to onychomycosis of toenails of both feet 11/23/2018  . Bipolar 1 disorder, manic, moderate ([place]) 07/21/2018  . Bipolar 1 disorder, mixed, severe ([place]) 07/21/2018  . Bipolar 1 disorder with moderate mania ([place]) 07/20/2018  . Severe bipolar I disorder, current or most recent episode depressed ([place]) 07/07/2017  . PTSD (post-traumatic stress disorder) 07/07/2017  . 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 Type 2 diabetes mellitus with hyperlipidemia ([place]) 09/23/2014  . Essential hypertension 09/21/2014  . Hypothyroidism 09/21/2014  . Dyslipidemia 09/21/2014  . Bipolar I disorder, current or most recent episode manic, severe with mixed features ([place]) 09/20/2014   PAST MEDICAL HISTORY:  Active Ambulatory Problems    Diagnosis Date Noted  . Bipolar I disorder, current or most recent episode manic, severe with mixed features ([place]) 09/20/2014  . Essential hypertension 09/21/2014  . Hypothyroidism 09/21/2014  . Dyslipidemia 09/21/2014  . Type 2 diabetes mellitus with hyperlipidemia ([place]) 09/23/2014  . 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 Severe bipolar I disorder, current or most recent episode depressed ([place]) 07/07/2017  . PTSD (post-traumatic stress  disorder) 07/07/2017  . Bipolar 1 disorder with moderate mania ([place]) 07/20/2018  . Bipolar 1 disorder, manic, moderate ([place]) 07/21/2018  . Bipolar 1 disorder, mixed, severe ([place]) 07/21/2018  . Pain due to onychomycosis of toenails of both feet 11/23/2018  . MDD (major depressive disorder), recurrent episode, severe ([place]) 06/24/2019  . MDD (major depressive disorder) 08/10/2019  . Bipolar disorder current episode depressed ([place]) 01/07/2020  . Generalized weakness 08/22/2020  . Chronic kidney disease, stage 3 unspecified ([place]) 05/11/2019  . UTI (urinary tract infection) 08/22/2020  . Unable to care for self 08/22/2020  . Bipolar 1 disorder ([place]) 08/22/2020  . Weakness 08/22/2020  . AKI (acute kidney injury) ([place]) 08/22/2020  . Aspiration pneumonia ([place]) 09/15/2020  . Acute respiratory failure with hypoxia ([place]) 09/16/2020  . Acute hypoxemic respiratory failure ([place]) 09/16/2020  . Hypomagnesemia   . SOB (shortness of breath)   . Endotracheally intubated    Resolved Ambulatory Problems    Diagnosis Date Noted  . DKA (diabetic ketoacidosis) ([place]) 07/24/2020   Past Medical History:  Diagnosis Date  . Arthritis   . Breast cancer ([place])   . Cancer ([place]) 2004  . Diabetes mellitus without complication ([place])   . Hypercholesterolemia   . Hypertension   . Hypothyroid   . Seasonal allergies    SOCIAL HX:  Social History   Tobacco Use  . Smoking status: Former  Smoker    Types: Cigarettes  . Smokeless tobacco: Never Used  Substance Use Topics  . Alcohol use: No   FAMILY HX:  Family History  Problem Relation Age of Onset  . Bladder Cancer Neg Hx   . Kidney cancer Neg Hx   Reviewed  ALLERGIES:  Allergies  Allergen Reactions  . Navane [Thiothixene] Other (See Comments)    Reaction:  Unknown  . Penicillins Rash    Has patient had a PCN reaction causing immediate rash, facial/tongue/throat swelling, SOB or lightheadedness with hypotension: No Has patient had a PCN reaction  causing severe rash involving mucus membranes or skin necrosis: No Has patient had a PCN reaction that required hospitalization: No Has patient had a PCN reaction occurring within the last 10 years: No If all of the above answers are "NO", then may proceed with Cephalosporin use.  Has patient had a PCN reaction causing immediate rash, facial/tongue/throat swelling, SOB or lightheadedness with hypotension: No Has patient had a PCN reaction causing severe rash involving mucus membranes or skin necrosis: No Has patient had a PCN reaction that required hospitalization: No Has patient had a PCN reaction occurring within the last 10 years: No If all of the above answers are "NO", then may proceed with Cephalosporin use. Has patient had a PCN reaction causing immediate rash, facial/tongue/throat swelling, SOB or lightheadedness with hypotension: No Has patient had a PCN reaction causing severe rash involving mucus membranes or skin necrosis: No Has patient had a PCN reaction that required hospitalization: No Has patient had a PCN reaction occurring within the last 10 years: No If all of the above answers are "NO", then may proceed with Cephalosporin use.      Questions and concerns were addressed. The staff was encouraged to call with questions and/or concerns. My contact was provided. Provided general support and encouragement, no other unmet needs identified  Thank you for the opportunity to participate in the care of Ms. Coates.  The palliative care team will continue to follow. Please call our office at [phone] if we can be of additional assistance.   This chart was dictated using voice recognition software. Despite best efforts to proofread, errors can occur which can change the documentation meaning.   Kataleya Zaugg Ihor Gully, NP , MSN, [facility]

## 2020-10-13 ENCOUNTER — Other Ambulatory Visit: Payer: Self-pay

## 2020-10-13 ENCOUNTER — Non-Acute Institutional Stay: Payer: Medicare Other | Admitting: Nurse Practitioner

## 2020-10-13 ENCOUNTER — Encounter: Payer: Self-pay | Admitting: Nurse Practitioner

## 2020-10-13 VITALS — BP 103/62 | HR 97 | Resp 18 | Wt 156.0 lb

## 2020-10-13 DIAGNOSIS — R5381 Other malaise: Secondary | ICD-10-CM | POA: Diagnosis not present

## 2020-10-13 DIAGNOSIS — Z515 Encounter for palliative care: Secondary | ICD-10-CM

## 2020-10-13 NOTE — Progress Notes (Signed)
[facility] Palliative Care Consult Note Telephone: [phone]  Fax: [phone]    Date of encounter: 10/13/20 PATIENT NAME: [name] [address] [place] [address]   [phone] (home)  DOB: 01/22/1946 MRN: [id number] PRIMARY CARE PROVIDER:   Dr [facility] [name], [place],  [address] [place] [address] [phone]  RESPONSIBLE PARTY:    Contact Information    Name Relation Home Work [place] ,[place] Other   [phone]   Dian Situ   [phone]     I met face to face with patient facility. Palliative Care was asked to follow this patient by consultation request of  Dr Reesa Chew  to address advance care planning and complex medical decision making. This is a follow up visit.  ASSESSMENT AND PLAN / RECOMMENDATIONS:  Symptom Management/Plan: 1. ACP Full code per Ms. Shoultz wishes, will continue discussions  2. Debility, encourage mobility, continue to work with therapy, fall precautions  3. Goals of Care: Goals include to maximize quality of life and symptom management. Our advance care planning conversation included a discussion about:   The value and importance of advance care planning  Exploration of personal, cultural or spiritual beliefs that might influence medical decisions  Exploration of goals of care in the event of a sudden injury or illness  Identification and preparation of a healthcare agent  Review and updating or creation of anadvance directive document.  4.Palliative care encounter; Palliative care encounter; Palliative medicine team will continue to support patient, patient's family, and medical team. Visit consisted of counseling and education dealing with the complex and emotionally intense issues of symptom management and palliative care in the setting of serious and potentially life-threatening illness  Follow up Palliative Care  Visit: Palliative care will continue to follow for complex medical decision making, advance care planning, and clarification of goals. Return 2 weeks or prn.  I spent 40 minutes providing this consultation. More than 50% of the time in this consultation was spent in counseling and care coordination.  PPS: 40%  HOSPICE ELIGIBILITY/DIAGNOSIS: TBD  Chief Complaint: Follow up palliative consult for complex medical decision making  HISTORY OF PRESENT ILLNESS:  [name] is a 75 y.o. year old female  with multiple medical problems including hypertension, diabetes, hypothyroidism,breast cancer, bipolar disorder. [name] was hospitalized 07/24/2020 to 08/02/2020 for weakness, falling at home, found to be in DKA requiring insulin drip transitioned to sq with metformin, detemir, glipizide, tradjenta. Dscharged to STR then home. Re-hospitalized 08/21/2020 to 08/24/2020 for being too weak to get up after being home for 1 day. Urine culture grew E. Coli, electrolyte abnormalities including hypokalemia and hypomagnesemia along with AKI which was resolved with repletion of fluid and electrolytes. Hospitalized 09/15/2020 to 10/02/2020 for acute hypoxemic respiratory failure with aspiration pna after choking on a hot dog. [name] under went s/p intubation &bronchoscopy. Extubated on 09/17/20, resolved. She was d/c to [facility] Term Rehab at [facility] where she currently resides. [name] does requires assistance for mobility, transfers, bathing, dressing, toileting incontinence of bowel/bladder due to weakness, deconditioning. Staff endorses [name] does feed herself with decreased appetite. Staff endorses no other changes. [name] continue to attempt therapy. I visited and observed [name]. We talked about purpose of PC visit, [name] in agreement. We talked about how she has been feeling. [name] endorses she is weak and tired. Ms Wedeking endorses she had hoped she  was able to  ambulate, farther along by now. We talked about realistic expectation, slow progression. We talked about symptoms of pain, shortness of breath which [name] denies. [name] endorses she is very tired. She is sleeping but feels like napping through the day. We talked about mobility, encouraged [name] to give herself time to get stronger. We talked about hopes to return home. Medical goals reviewed. [name] became more fatigue during visit, cooperative with assessment. Discussed with [name] will f/u in 2 weeks to see progress, [name] in agreement. Emotional support provided. No new changes or recommendations for today, updated staff.   History obtained from review of EMR, discussion with staff and  [name].  I reviewed available labs, medications, imaging, studies and related documents from the EMR.  Records reviewed and summarized above.   ROS Full 14 system review of systems performed and negative with exception of: as per HPI.  Physical Exam: Constitutional: NAD General: frail appearing, decompensated weak, pleasant female EYES: lids intact ENMT:oral mucous membranes moist CV: S1S2, RRR Pulmonary: LCTA, no increased work of breathing, no cough, room air Abdomen:  normo-active BS + 4 quadrants, soft and non tender,  Skin: warm and dry, no rashes or wounds on visible skin Neuro:  + generalized weakness,  no cognitive impairment Psych: non-anxious affect, A and Oriented  Questions and concerns were addressed. The staff was encouraged to call with questions and/or concerns. My contact information was provided. Provided general support and encouragement, no other unmet needs identified  Thank you for the opportunity to participate in the care of Ms. Bonk.  The palliative care team will continue to follow. Please call our office at [phone] if we can be of additional assistance.   This chart was dictated using voice recognition software. Despite best efforts to  proofread, errors can occur which can change the documentation meaning.   Sourish Allender Ihor Gully, NP , MSN,, [facility]

## 2020-10-14 DIAGNOSIS — Z1159 Encounter for screening for other viral diseases: Secondary | ICD-10-CM | POA: Diagnosis not present

## 2020-10-14 DIAGNOSIS — J9601 Acute respiratory failure with hypoxia: Secondary | ICD-10-CM | POA: Diagnosis not present

## 2020-10-14 DIAGNOSIS — M6281 Muscle weakness (generalized): Secondary | ICD-10-CM | POA: Diagnosis not present

## 2020-10-15 DIAGNOSIS — J9601 Acute respiratory failure with hypoxia: Secondary | ICD-10-CM | POA: Diagnosis not present

## 2020-10-15 DIAGNOSIS — M6281 Muscle weakness (generalized): Secondary | ICD-10-CM | POA: Diagnosis not present

## 2020-10-15 DIAGNOSIS — Z1159 Encounter for screening for other viral diseases: Secondary | ICD-10-CM | POA: Diagnosis not present

## 2020-10-17 DIAGNOSIS — E119 Type 2 diabetes mellitus without complications: Secondary | ICD-10-CM | POA: Diagnosis not present

## 2020-10-17 DIAGNOSIS — E039 Hypothyroidism, unspecified: Secondary | ICD-10-CM | POA: Diagnosis not present

## 2020-10-17 DIAGNOSIS — M6281 Muscle weakness (generalized): Secondary | ICD-10-CM | POA: Diagnosis not present

## 2020-10-17 DIAGNOSIS — J9601 Acute respiratory failure with hypoxia: Secondary | ICD-10-CM | POA: Diagnosis not present

## 2020-10-17 DIAGNOSIS — R1030 Lower abdominal pain, unspecified: Secondary | ICD-10-CM | POA: Diagnosis not present

## 2020-10-17 DIAGNOSIS — Z1159 Encounter for screening for other viral diseases: Secondary | ICD-10-CM | POA: Diagnosis not present

## 2020-10-17 DIAGNOSIS — F82 Specific developmental disorder of motor function: Secondary | ICD-10-CM | POA: Diagnosis not present

## 2020-10-23 ENCOUNTER — Other Ambulatory Visit: Payer: Self-pay

## 2020-10-23 ENCOUNTER — Non-Acute Institutional Stay: Payer: Medicare Other | Admitting: Nurse Practitioner

## 2020-10-23 ENCOUNTER — Observation Stay: Payer: Medicare Other

## 2020-10-23 ENCOUNTER — Inpatient Hospital Stay
Admission: EM | Admit: 2020-10-23 | Discharge: 2020-10-27 | DRG: 689 | Disposition: A | Discharge status: Skilled Nursing Facility | Payer: Medicare Other | Attending: Internal Medicine | Admitting: Internal Medicine

## 2020-10-23 ENCOUNTER — Emergency Department: Payer: Medicare Other

## 2020-10-23 ENCOUNTER — Encounter: Payer: Self-pay | Admitting: Nurse Practitioner

## 2020-10-23 VITALS — BP 122/79 | HR 82 | Resp 18 | Wt 156.0 lb

## 2020-10-23 DIAGNOSIS — F329 Major depressive disorder, single episode, unspecified: Secondary | ICD-10-CM | POA: Diagnosis present

## 2020-10-23 DIAGNOSIS — F419 Anxiety disorder, unspecified: Secondary | ICD-10-CM | POA: Diagnosis present

## 2020-10-23 DIAGNOSIS — I959 Hypotension, unspecified: Secondary | ICD-10-CM | POA: Diagnosis present

## 2020-10-23 DIAGNOSIS — E119 Type 2 diabetes mellitus without complications: Secondary | ICD-10-CM | POA: Diagnosis present

## 2020-10-23 DIAGNOSIS — Z888 Allergy status to other drugs, medicaments and biological substances status: Secondary | ICD-10-CM

## 2020-10-23 DIAGNOSIS — Z880 Allergy status to penicillin: Secondary | ICD-10-CM

## 2020-10-23 DIAGNOSIS — J9601 Acute respiratory failure with hypoxia: Secondary | ICD-10-CM

## 2020-10-23 DIAGNOSIS — F325 Major depressive disorder, single episode, in full remission: Secondary | ICD-10-CM

## 2020-10-23 DIAGNOSIS — F319 Bipolar disorder, unspecified: Secondary | ICD-10-CM | POA: Diagnosis present

## 2020-10-23 DIAGNOSIS — E039 Hypothyroidism, unspecified: Secondary | ICD-10-CM | POA: Diagnosis present

## 2020-10-23 DIAGNOSIS — Z853 Personal history of malignant neoplasm of breast: Secondary | ICD-10-CM

## 2020-10-23 DIAGNOSIS — N390 Urinary tract infection, site not specified: Secondary | ICD-10-CM | POA: Diagnosis not present

## 2020-10-23 DIAGNOSIS — R4182 Altered mental status, unspecified: Secondary | ICD-10-CM | POA: Diagnosis present

## 2020-10-23 DIAGNOSIS — R9431 Abnormal electrocardiogram [ECG] [EKG]: Secondary | ICD-10-CM | POA: Diagnosis present

## 2020-10-23 DIAGNOSIS — Z79890 Hormone replacement therapy (postmenopausal): Secondary | ICD-10-CM

## 2020-10-23 DIAGNOSIS — F314 Bipolar disorder, current episode depressed, severe, without psychotic features: Secondary | ICD-10-CM | POA: Diagnosis present

## 2020-10-23 DIAGNOSIS — I10 Essential (primary) hypertension: Secondary | ICD-10-CM | POA: Diagnosis not present

## 2020-10-23 DIAGNOSIS — R531 Weakness: Secondary | ICD-10-CM

## 2020-10-23 DIAGNOSIS — Z20822 Contact with and (suspected) exposure to covid-19: Secondary | ICD-10-CM | POA: Diagnosis present

## 2020-10-23 DIAGNOSIS — Z789 Other specified health status: Secondary | ICD-10-CM | POA: Diagnosis present

## 2020-10-23 DIAGNOSIS — L89152 Pressure ulcer of sacral region, stage 2: Secondary | ICD-10-CM | POA: Diagnosis present

## 2020-10-23 DIAGNOSIS — Z87891 Personal history of nicotine dependence: Secondary | ICD-10-CM

## 2020-10-23 DIAGNOSIS — E876 Hypokalemia: Secondary | ICD-10-CM | POA: Diagnosis present

## 2020-10-23 DIAGNOSIS — E7800 Pure hypercholesterolemia, unspecified: Secondary | ICD-10-CM | POA: Diagnosis present

## 2020-10-23 DIAGNOSIS — R410 Disorientation, unspecified: Secondary | ICD-10-CM | POA: Diagnosis not present

## 2020-10-23 DIAGNOSIS — F332 Major depressive disorder, recurrent severe without psychotic features: Secondary | ICD-10-CM | POA: Diagnosis present

## 2020-10-23 DIAGNOSIS — Z7984 Long term (current) use of oral hypoglycemic drugs: Secondary | ICD-10-CM

## 2020-10-23 DIAGNOSIS — Z515 Encounter for palliative care: Secondary | ICD-10-CM

## 2020-10-23 DIAGNOSIS — Z79899 Other long term (current) drug therapy: Secondary | ICD-10-CM

## 2020-10-23 DIAGNOSIS — R5383 Other fatigue: Secondary | ICD-10-CM

## 2020-10-23 DIAGNOSIS — Z794 Long term (current) use of insulin: Secondary | ICD-10-CM

## 2020-10-23 DIAGNOSIS — F0390 Unspecified dementia without behavioral disturbance: Secondary | ICD-10-CM | POA: Diagnosis present

## 2020-10-23 DIAGNOSIS — G9341 Metabolic encephalopathy: Secondary | ICD-10-CM | POA: Diagnosis present

## 2020-10-23 LAB — URINALYSIS, COMPLETE (UACMP) WITH MICROSCOPIC
Bilirubin Urine: NEGATIVE
Glucose, UA: NEGATIVE mg/dL
Hgb urine dipstick: NEGATIVE
Ketones, ur: 5 mg/dL — AB
Nitrite: POSITIVE — AB
Protein, ur: 100 mg/dL — AB
Specific Gravity, Urine: 1.028 (ref 1.005–1.030)
WBC, UA: >50 WBC/hpf — ABNORMAL HIGH (ref 0–5)
pH: 5 (ref 5.0–8.0)

## 2020-10-23 LAB — COMPREHENSIVE METABOLIC PANEL
ALT: 12 U/L (ref 0–44)
AST: 19 U/L (ref 15–41)
Albumin: 3 g/dL — ABNORMAL LOW (ref 3.5–5.0)
Alkaline Phosphatase: 44 U/L (ref 38–126)
Anion gap: 10 (ref 5–15)
BUN: 25 mg/dL — ABNORMAL HIGH (ref 8–23)
CO2: 25 mmol/L (ref 22–32)
Calcium: 9.3 mg/dL (ref 8.9–10.3)
Chloride: 102 mmol/L (ref 98–111)
Creatinine, Ser: 0.94 mg/dL (ref 0.44–1.00)
GFR, Estimated: >60 mL/min (ref >60)
Glucose, Bld: 178 mg/dL — ABNORMAL HIGH (ref 70–99)
Potassium: 3.9 mmol/L (ref 3.5–5.1)
Sodium: 137 mmol/L (ref 135–145)
Total Bilirubin: 0.6 mg/dL (ref 0.3–1.2)
Total Protein: 6.6 g/dL (ref 6.5–8.1)

## 2020-10-23 LAB — RESP PANEL BY RT-PCR (FLU A&B, COVID) ARPGX2
Influenza A by PCR: NEGATIVE
Influenza B by PCR: NEGATIVE
SARS Coronavirus 2 by RT PCR: NEGATIVE

## 2020-10-23 LAB — CBC
HCT: 43.6 % (ref 36.0–46.0)
Hemoglobin: 15.1 g/dL — ABNORMAL HIGH (ref 12.0–15.0)
MCH: 30.5 pg (ref 26.0–34.0)
MCHC: 34.6 g/dL (ref 30.0–36.0)
MCV: 88.1 fL (ref 80.0–100.0)
Platelets: 237 10*3/uL (ref 150–400)
RBC: 4.95 MIL/uL (ref 3.87–5.11)
RDW: 13.6 % (ref 11.5–15.5)
WBC: 12.4 10*3/uL — ABNORMAL HIGH (ref 4.0–10.5)
nRBC: 0 % (ref 0.0–0.2)

## 2020-10-23 LAB — PROCALCITONIN: Procalcitonin: 0.11 ng/mL

## 2020-10-23 LAB — TROPONIN I (HIGH SENSITIVITY)
Troponin I (High Sensitivity): 8 ng/L (ref <18)
Troponin I (High Sensitivity): 7 ng/L (ref <18)

## 2020-10-23 LAB — CBG MONITORING, ED: Glucose-Capillary: 274 mg/dL — ABNORMAL HIGH (ref 70–99)

## 2020-10-23 IMAGING — CT CT HEAD W/O CM
4 series · 17 of 47 positions shown, 19 images · non-contrast
Comparison: [DATE]

CLINICAL DATA: Weakness and lethargy.

EXAM:
CT HEAD WITHOUT CONTRAST
TECHNIQUE: Contiguous axial images were obtained from the base of the skull
through the vertex without intravenous contrast.

[Series 2: head bone · axial · 0.39mm/px · z∈[-118,-66]mm · 4 of 75 slices shown]
[im 8/75  bone]
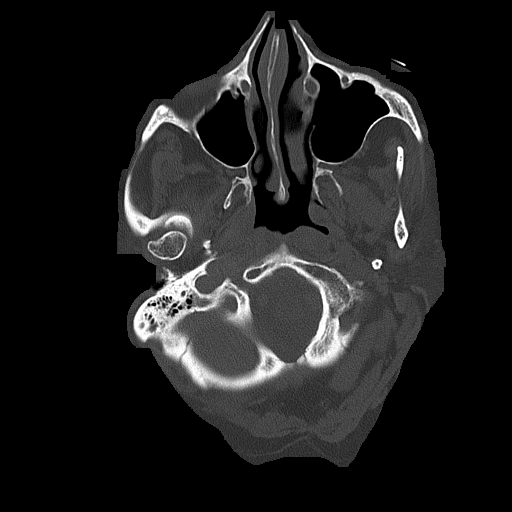
[im 15/75  bone]
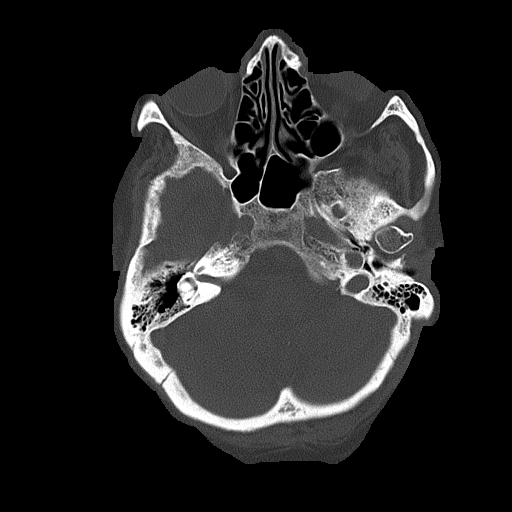
[im 23/75  bone]
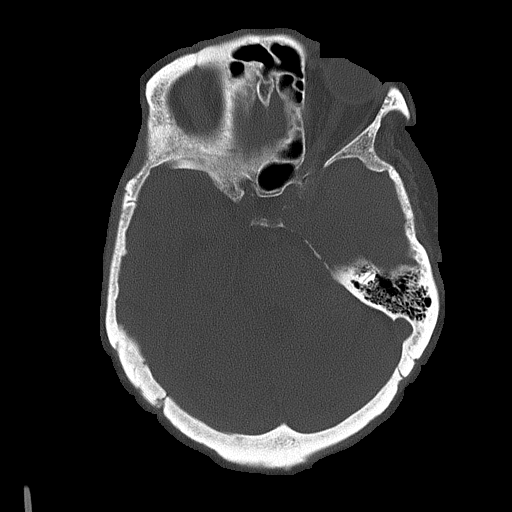
[im 34/75  bone]
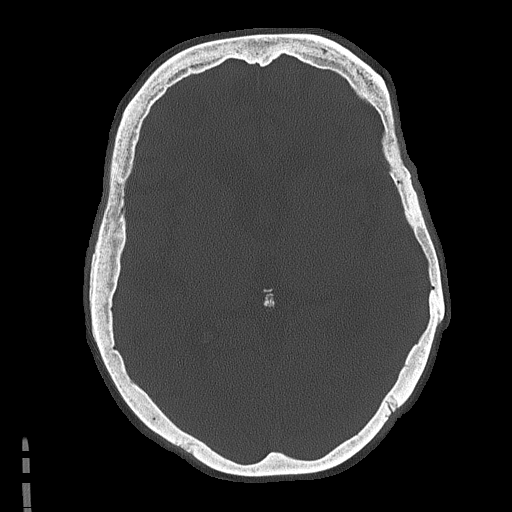

[Series 3: head wo · axial · 0.39mm/px · z∈[-117,-7]mm · 7 of 30 slices shown, 9 images]
[im 4/30  brain]
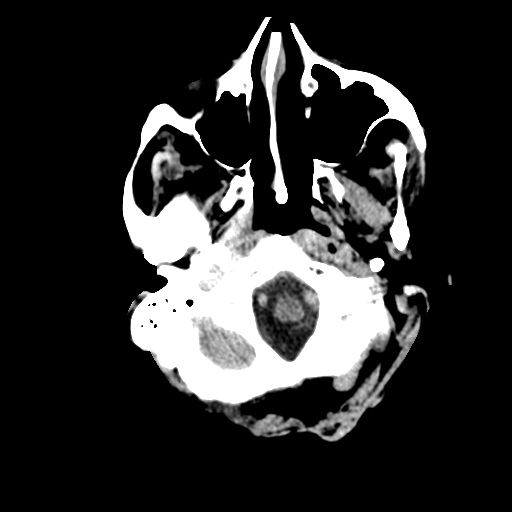
[im 4/30  bone]
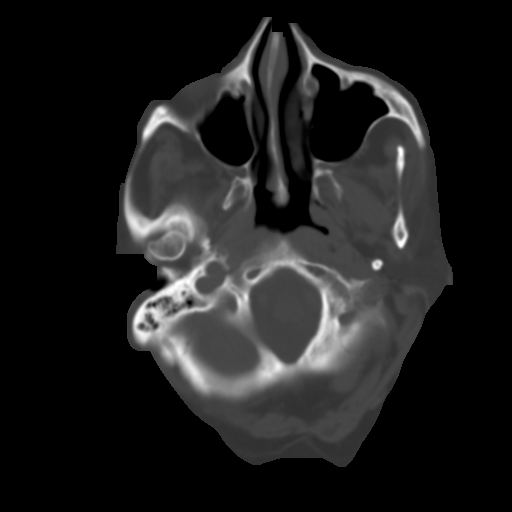
[im 8/30  brain]
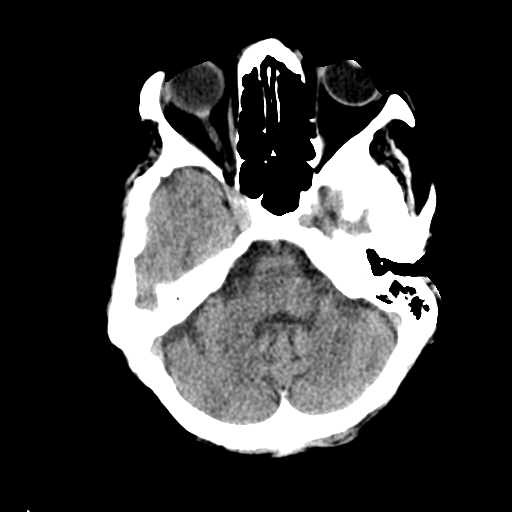
[im 11/30  brain]
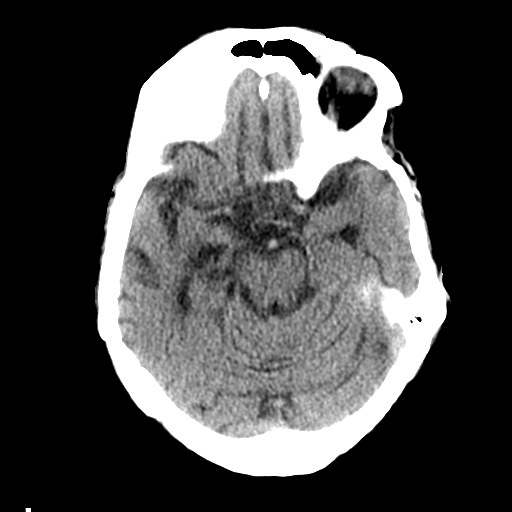
[im 15/30  brain]
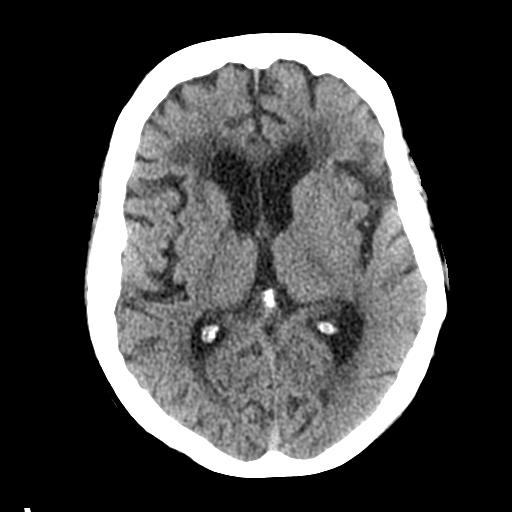
[im 19/30  brain]
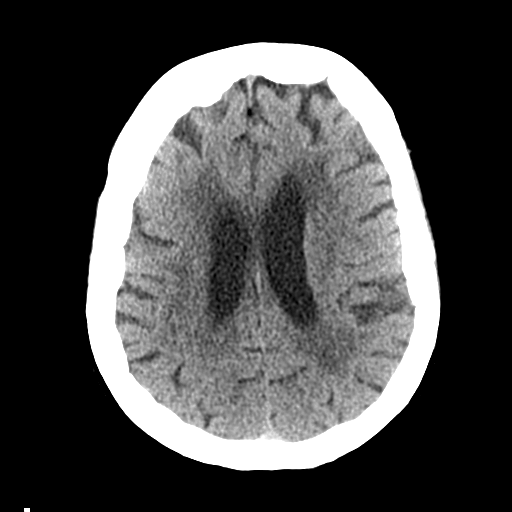
[im 19/30  bone]
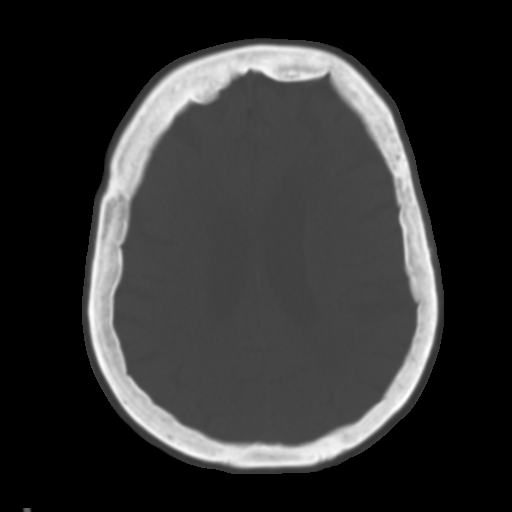
[im 22/30  brain]
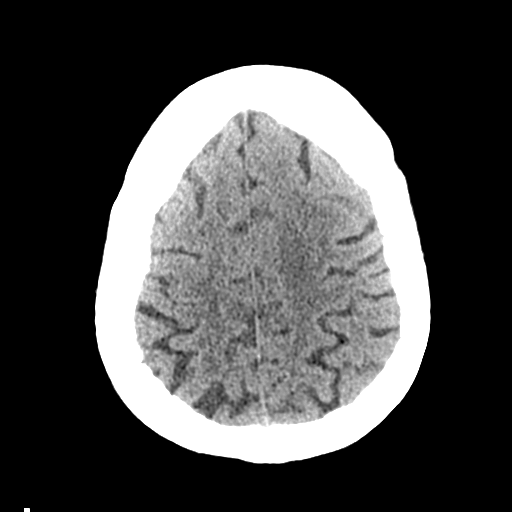
[im 26/30  brain]
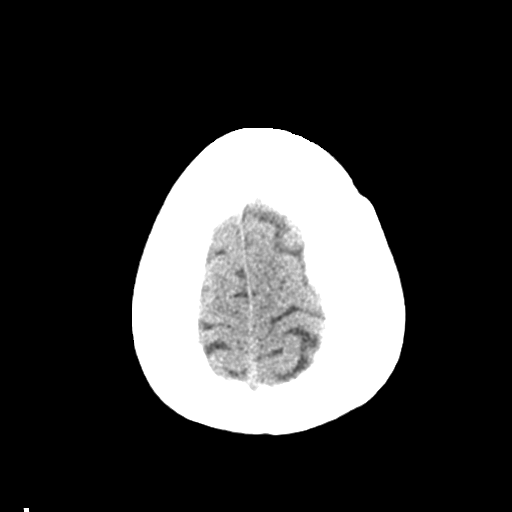

[Series 4: coronal soft tissue · coronal · 0.29mm/px · 3 of 63 slices shown]
[im 21/63  brain]
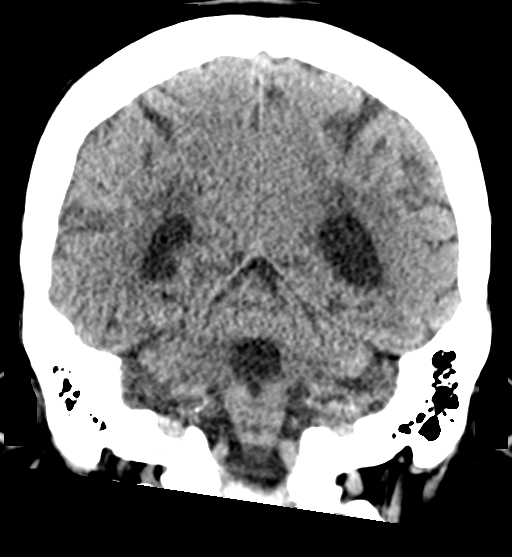
[im 28/63  brain]
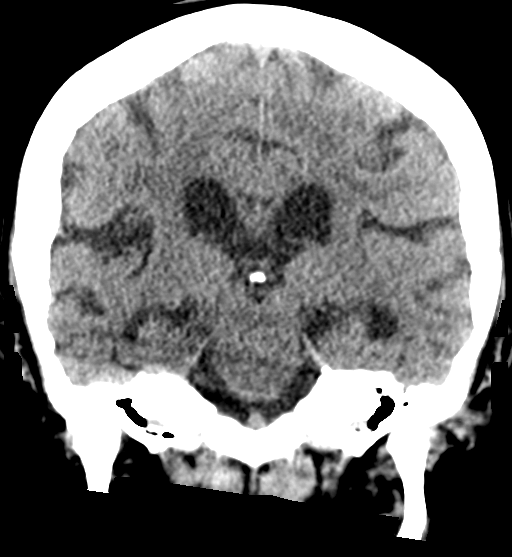
[im 35/63  brain]
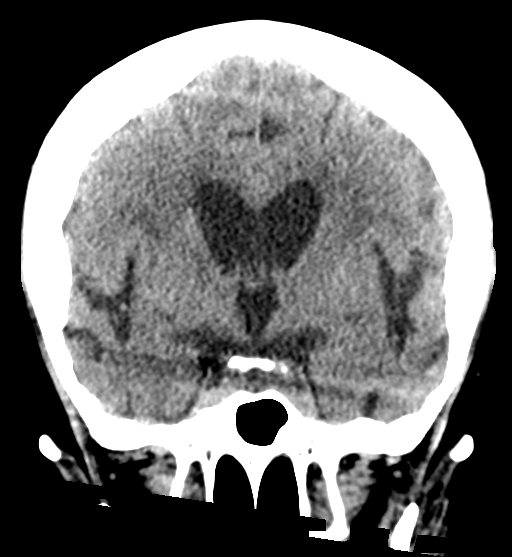

[Series 5: sagittal soft tissue · sagittal · 0.31mm/px · 3 of 50 slices shown]
[im 18/50  brain]
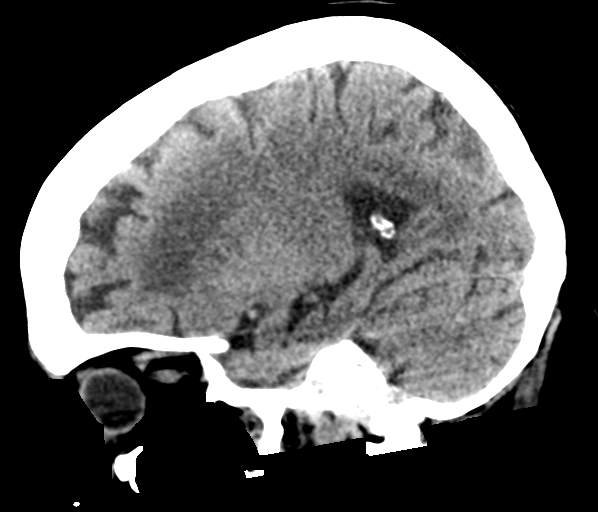
[im 25/50  brain]
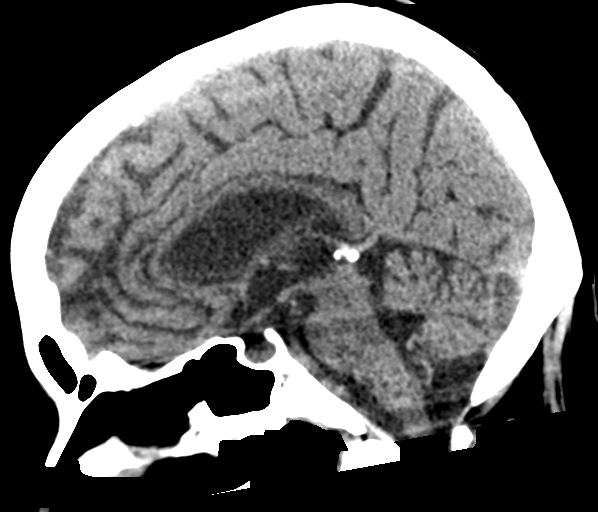
[im 32/50  brain]
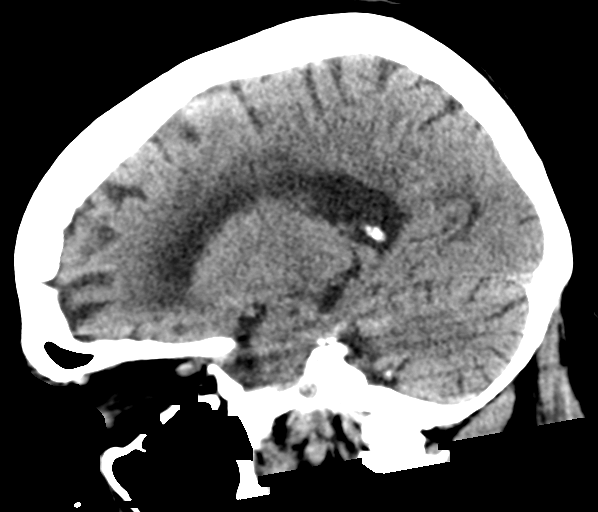

[17 of 47 positions shown; findings below may reference images not displayed]

FINDINGS: Brain: Moderate low density in the periventricular white matter
likely related to small vessel disease. Cerebral atrophy is likely
age appropriate. Mild ventriculomegaly is likely secondary. No mass
lesion, hemorrhage, acute infarct, intra-axial, or extra-axial fluid
collection.

Vascular: No hyperdense vessel or unexpected calcification.

Skull: No significant soft tissue swelling. Hyperostosis frontalis
interna.

Sinuses/Orbits: Normal imaged portions of the orbits and globes.
Hypoplastic right frontal sinus. Otherwise normal paranasal sinuses
and mastoid air cells.

Other: None.
IMPRESSION: 1. No acute intracranial abnormality.
2. Cerebral atrophy and small vessel ischemic change.

## 2020-10-23 IMAGING — DX DG CHEST 1V PORT
1 series · 1 of 1 positions shown · non-contrast
Comparison: [DATE]

CLINICAL DATA: Weakness, lethargy

EXAM:
PORTABLE CHEST 1 VIEW

[chest ap]
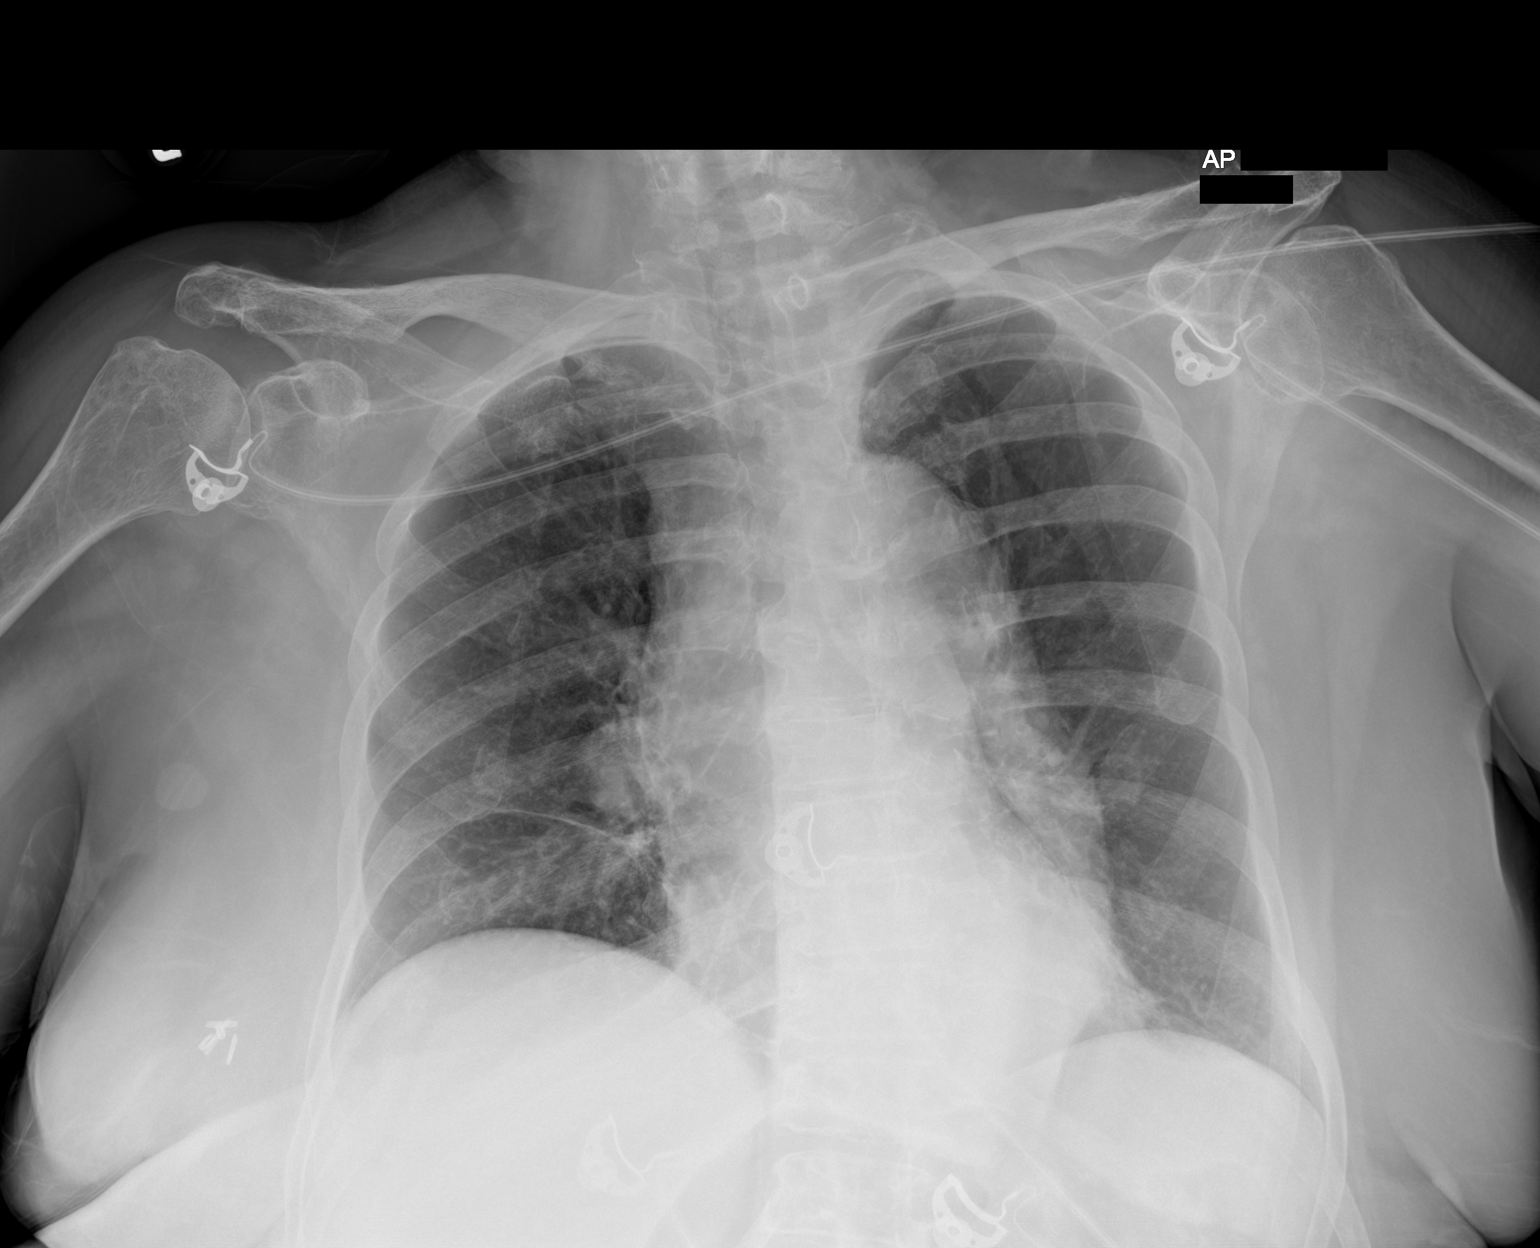

[1 of 1 positions shown; findings below may reference images not displayed]

FINDINGS: Mild bibasilar scarring/atelectasis. No focal consolidation. No
pleural effusion or pneumothorax.

Heart is normal in size.
IMPRESSION: No evidence of acute cardiopulmonary disease.

## 2020-10-23 MED ORDER — SODIUM CHLORIDE 0.9 % IV SOLN
1.0000 g | Freq: Once | INTRAVENOUS | Status: AC
  Administered 2020-10-23: 1 g via INTRAVENOUS
  Filled 2020-10-23: qty 10

## 2020-10-23 MED ORDER — ACETAMINOPHEN 650 MG RE SUPP: 650.0000 mg | Freq: Four times a day (QID) | RECTAL | Status: AC | PRN

## 2020-10-23 MED ORDER — ACETAMINOPHEN 500 MG PO TABS: 1000.0000 mg | ORAL_TABLET | Freq: Four times a day (QID) | ORAL | Status: AC | PRN

## 2020-10-23 MED ORDER — LURASIDONE HCL 20 MG PO TABS
20.0000 mg | ORAL_TABLET | Freq: Every day | ORAL | Status: DC
  Administered 2020-10-24 – 2020-10-27 (×4): 20 mg via ORAL
  Filled 2020-10-23 (×7): qty 1

## 2020-10-23 MED ORDER — SODIUM CHLORIDE 0.9 % IV SOLN
1.0000 g | INTRAVENOUS | Status: DC
  Administered 2020-10-24 – 2020-10-25 (×2): 1 g via INTRAVENOUS
  Filled 2020-10-23: qty 10
  Filled 2020-10-23 (×2): qty 1

## 2020-10-23 MED ORDER — INSULIN DETEMIR 100 UNIT/ML ~~LOC~~ SOLN
15.0000 [IU] | Freq: Every day | SUBCUTANEOUS | Status: DC
  Administered 2020-10-23 – 2020-10-25 (×2): 15 [IU] via SUBCUTANEOUS
  Filled 2020-10-23 (×4): qty 0.15

## 2020-10-23 MED ORDER — ONDANSETRON HCL 4 MG PO TABS: 4.0000 mg | ORAL_TABLET | Freq: Four times a day (QID) | ORAL | Status: DC | PRN

## 2020-10-23 MED ORDER — DIVALPROEX SODIUM ER 500 MG PO TB24
1000.0000 mg | ORAL_TABLET | Freq: Every day | ORAL | Status: DC
  Administered 2020-10-23 – 2020-10-26 (×4): 1000 mg via ORAL
  Filled 2020-10-23: qty 4
  Filled 2020-10-23 (×4): qty 2

## 2020-10-23 MED ORDER — ENALAPRIL MALEATE 5 MG PO TABS
5.0000 mg | ORAL_TABLET | Freq: Two times a day (BID) | ORAL | Status: DC
  Administered 2020-10-24 – 2020-10-27 (×6): 5 mg via ORAL
  Filled 2020-10-23 (×8): qty 1

## 2020-10-23 MED ORDER — LINAGLIPTIN 5 MG PO TABS
5.0000 mg | ORAL_TABLET | Freq: Every day | ORAL | Status: DC
  Administered 2020-10-24 – 2020-10-27 (×4): 5 mg via ORAL
  Filled 2020-10-23 (×5): qty 1

## 2020-10-23 MED ORDER — FLUOXETINE HCL 20 MG PO CAPS
60.0000 mg | ORAL_CAPSULE | Freq: Every day | ORAL | Status: DC
  Administered 2020-10-24 – 2020-10-27 (×4): 60 mg via ORAL
  Filled 2020-10-23 (×5): qty 3

## 2020-10-23 MED ORDER — LEVOTHYROXINE SODIUM 88 MCG PO TABS
88.0000 ug | ORAL_TABLET | Freq: Every day | ORAL | Status: DC
  Administered 2020-10-24 – 2020-10-27 (×4): 88 ug via ORAL
  Filled 2020-10-23 (×6): qty 1

## 2020-10-23 MED ORDER — METFORMIN HCL 500 MG PO TABS
1000.0000 mg | ORAL_TABLET | Freq: Two times a day (BID) | ORAL | Status: DC
  Administered 2020-10-24 – 2020-10-27 (×6): 1000 mg via ORAL
  Filled 2020-10-23 (×6): qty 2

## 2020-10-23 MED ORDER — ENOXAPARIN SODIUM 40 MG/0.4ML IJ SOSY
40.0000 mg | PREFILLED_SYRINGE | INTRAMUSCULAR | Status: DC
  Administered 2020-10-23 – 2020-10-26 (×4): 40 mg via SUBCUTANEOUS
  Filled 2020-10-23 (×4): qty 0.4

## 2020-10-23 MED ORDER — INSULIN ASPART 100 UNIT/ML IJ SOLN
0.0000 [IU] | Freq: Three times a day (TID) | INTRAMUSCULAR | Status: DC
  Administered 2020-10-24: 3 [IU] via SUBCUTANEOUS
  Administered 2020-10-25: 2 [IU] via SUBCUTANEOUS
  Filled 2020-10-23 (×2): qty 1

## 2020-10-23 MED ORDER — ONDANSETRON HCL 4 MG/2ML IJ SOLN: 4.0000 mg | Freq: Four times a day (QID) | INTRAMUSCULAR | Status: DC | PRN

## 2020-10-23 MED ORDER — ROSUVASTATIN CALCIUM 10 MG PO TABS
40.0000 mg | ORAL_TABLET | Freq: Every day | ORAL | Status: DC
  Administered 2020-10-23 – 2020-10-26 (×4): 40 mg via ORAL
  Filled 2020-10-23: qty 4
  Filled 2020-10-23: qty 2
  Filled 2020-10-23 (×2): qty 4

## 2020-10-23 MED ORDER — INSULIN ASPART 100 UNIT/ML IJ SOLN
0.0000 [IU] | Freq: Every day | INTRAMUSCULAR | Status: DC
  Administered 2020-10-23: 3 [IU] via SUBCUTANEOUS
  Filled 2020-10-23: qty 1

## 2020-10-23 MED ORDER — CLONAZEPAM 1 MG PO TABS: 1.0000 mg | ORAL_TABLET | Freq: Two times a day (BID) | ORAL | Status: DC | PRN

## 2020-10-23 NOTE — ED Notes (Signed)
Brief changed for urine incontinence; strong urine odor noted. Pt sts she walks with walker normally, but is not able to roll in bed without assistance at this time.

## 2020-10-23 NOTE — Progress Notes (Signed)
[facility] Consult Note Telephone: [phone]  Fax: [phone]    Date of encounter: 10/23/20 PATIENT NAME: [name] [address] [place] [address]   [phone] (home)  DOB: 10/10/1945 MRN: [id number] PRIMARY CARE PROVIDER:   Dr Lucianne Lei [facility] [name], [place],  [address] [place] [address] [phone]  RESPONSIBLE PARTY:    Contact Information    Name Relation Home Work [place] ,[place] Other   [phone]   Dian Situ   [phone]     I met face to face with patient in facility. Palliative Care was asked to follow this patient by consultation request of Dr Nona Dell to address advance care planning and complex medical decision making. This is a follow up visit ASSESSMENT AND PLAN / RECOMMENDATIONS:  Symptom Management/Plan: 1. ACP Full code per [name] wishes, will continue discussions illness  2. Lethargic with clinical presentation, AMS, discussed with Cindy Linsey NP and in agreement to send to [facility] ED for further evaluation. Notified RN to send to ED  Follow up Palliative Care Visit: Palliative care will continue to follow for complex medical decision making, advance care planning, and clarification of goals. Return from hospitalization  I spent 35 minutes providing this consultation. More than 50% of the time in this consultation was spent in counseling and care coordination.  PPS: 30%  HOSPICE ELIGIBILITY/DIAGNOSIS: TBD  Chief Complaint: Follow up palliative consult for complex medical decision making  HISTORY OF PRESENT ILLNESS:  [name] is a 75 y.o. year old female  with . with multiple medical problems includinghypertension, diabetes, hypothyroidism,breast cancer, bipolar disorder. [name] was hospitalized 07/24/2020 to 08/02/2020 for weakness, falling at home, found to be in DKA requiring insulin drip  transitioned to sq withmetformin, detemir, glipizide, tradjenta. Dscharged to STR then home. Re-hospitalized 08/21/2020 to 08/24/2020 for being too weak to get up after being home for 1 day.Urine culture grew E. Coli, electrolyte abnormalitiesincluding hypokalemia and hypomagnesemia along with AKI which was resolved with repletion of fluid and electrolytes.Hospitalized 09/15/2020 to 10/02/2020 for acute hypoxemic respiratory failure with aspiration pna after choking on a hot dog. [name] under went s/p intubation &bronchoscopy. Extubated on 09/17/20, resolved. She was d/c to Short Term Rehab at [facility] where she currently resides. [name] does requires assistance for mobility, transfers, bathing, dressing, toileting incontinence of bowel/bladder due to weakness, deconditioning. Staff endorses [name] does feed herself with decreased appetite. Staff endorses [name] has been quieter than usual. At present [name] is lying in bed, difficulty getting her to arouse. [name] will open her left eye keeping right one closed briefly when saying her name. [name] appears severely weak, debilitated, no respiratory distress. This clinical presentation is very altered from previous visits I called Cindy Linsey NP, discussed case with concern for AMS. [name] previous [place] visit expressed wishes for full code. Cindy Linsey NP and I in agreement to sent to [facility] ED for further evaluation/workup for possible AMS. Emotional support provided. I updated staff  History obtained from review of EMR, discussion with Cindy Linsey NP, staff and  [name].  I reviewed available labs, medications, imaging, studies and related documents from the EMR.  Records reviewed and summarized above.   ROS Full 14 system review of systems performed and negative with exception of: as per HPI.  Physical Exam: Constitutional: appears ill General: debilitated, lethargic female EYES:  lids  intact ENMT:  oral mucous membranes moist CV: S1S2, RRR,  Pulmonary: poor air movement Abdomen: normo-active BS + 4 quadrants, soft and non tender MSK: bedbound Skin: warm and dry Neuro:  + generalized weakness Psych: lethargic  Questions and concerns were addressed.  Provided general support and encouragement, no other unmet needs identified  Thank you for the opportunity to participate in the care of [name].  The palliative care team will continue to follow. Please call our office at [phone] if we can be of additional assistance.   This chart was dictated using voice recognition software. Despite best efforts to proofread, errors can occur which can change the documentation meaning.   Scarlette Hogston Ihor Gully, NP , MSN,  [facility]

## 2020-10-23 NOTE — H&P (Signed)
History and Physical   [name] BMW:[id number] DOB: [birth year] DOA: 10/23/2020  PCP: Mechele Claude, FNP  Patient coming from: facility  I have personally briefly reviewed patient's old medical records in [place].  Chief Concern: Altered mental status  HPI: [name] is a 75 y.o. female with medical history significant for bipolar 1 with history of moderate mania, hypertension, acquired hypothyroid, dyslipidemia, depression, history of endotracheal intubation, history of respiratory failure, history of acute kidney injury, insulin-dependent diabetes mellitus, on metformin, presents to the emergency department from facility for chief concerns of altered mentation.  At bedside patient was able to tell me her full name.  She states that she is 75 years old and was not able to tell me her current location or current calendar year.  Social history: unknown  Vaccinations: unknown  ROS: Unable to complete, suspect underlying dementia as well  ED Course: Discussed with ED provider, patient requiring hospitalization for altered mental status secondary to urinary tract infection.  Vitals in the emergency department was remarkable for temperature 97.8, respiration rate of 20, heart rate 85, blood pressure 134/86, SPO2 of 94% on room air.  ED provider gave patient 1 dose of ceftriaxone IV.  Labs in the emergency department was remarkable for WBC elevated at 12.4, hemoglobin 15.1, platelets 237, sodium 137, potassium 3.9, chloride 102, BUN 25, serum creatinine of 0.94, nonfasting blood glucose 178.  Assessment/Plan  Active Problems:   Essential hypertension   Hypothyroidism   Severe bipolar I disorder, current or most recent episode depressed (HCC)   MDD (major depressive disorder), recurrent episode, severe ([place])   MDD (major depressive disorder)   Unable to care for self   Bipolar 1 disorder ([place])   Altered mental status   Altered mentation suspect  secondary to urinary tract infection Suspect baseline dementia - Continue ceftriaxone 1 g IV - Admit to MedSurg, observation  Hypertension-controlled - Resumed enalapril 5 mg twice daily first dose on 10/24/2020  Hyperlipidemia-rosuvastatin 40 mg nightly resumed  Prolonged QT on admission- suspect secondary to urinary tract infection - Repeat EKG scheduled for 5 AM on 10/24/2020  Insulin-dependent diabetes mellitus-resumed insulin detemir 15 units daily, metformin 1000 mg twice daily - Linagliptin 5 mg daily resumed for 10/25/2018 2 in the AM - Insulin SSI with at bedtime coverage ordered  Anxiety/depression/psychiatric imbalance-fluoxetine 60 mg daily resumed, lurasidone 20 mg daily resumed -Klonopin is a home medication that is scheduled however given the patient's altered mental status at this time, I have resumed it at 1 mg p.o. twice daily as a as needed for anxiety  Psychiatric imbalance- Depakote 1000 mg nightly resumed  Hypothyroid- Synthroid 88 mcg daily before breakfast resumed  History of aspiration pneumonia-aspiration precaution placed - Incentive spirometry and flutter valve ordered  Discussed with RN, patient is not to self feed  Nursing communication placed in epic- has history of aspiration pneumonia requiring intubation, patient is not to self feed.  Patient requires assistant with feeding  Chart reviewed.   Hospitalization from 09/15/2020 to 10/02/2020: For shortness of breath and acute hypoxemic respiratory failure requiring endotracheal intubation secondary to aspiration pneumonia.  Patient was treated for acute hypoxemic respiratory failure secondary to aspiration of hotdog, she status post intubation and bronchoscopy.  DVT prophylaxis: Enoxaparin Code Status: Full code Diet: Heart healthy/carb modified Family Communication: No Disposition Plan: Pending clinical course Consults called: None at this time Admission status: MedSurg, observation  Past Medical  History:  Diagnosis Date  . Arthritis   .  Breast cancer ([place])   . Cancer [facility]) 2004   right breast ca  . Diabetes mellitus without complication ([place])   . Hypercholesterolemia   . Hypertension   . Hypothyroid   . Seasonal allergies    Past Surgical History:  Procedure Laterality Date  . ABDOMINAL HYSTERECTOMY    . BREAST BIOPSY Left    neg  . BREAST EXCISIONAL BIOPSY Right 2004   breast ca and lumpectomy  . BREAST LUMPECTOMY Right 2004   breast ca  . CHOLECYSTECTOMY    . COLONOSCOPY WITH PROPOFOL N/A 02/13/2017   Procedure: COLONOSCOPY WITH PROPOFOL;  Surgeon: Jonathon Bellows, MD;  Location: [facility] ENDOSCOPY;  Service: Gastroenterology;  Laterality: N/A;  . KIDNEY STONE SURGERY     Social History:  reports that she has quit smoking. Her smoking use included cigarettes. She has never used smokeless tobacco. She reports that she does not drink alcohol and does not use drugs.  Allergies  Allergen Reactions  . Navane [Thiothixene] Other (See Comments)    Reaction:  Unknown  . Penicillins Rash    Has patient had a PCN reaction causing immediate rash, facial/tongue/throat swelling, SOB or lightheadedness with hypotension: No Has patient had a PCN reaction causing severe rash involving mucus membranes or skin necrosis: No Has patient had a PCN reaction that required hospitalization: No Has patient had a PCN reaction occurring within the last 10 years: No If all of the above answers are "NO", then may proceed with Cephalosporin use.  Has patient had a PCN reaction causing immediate rash, facial/tongue/throat swelling, SOB or lightheadedness with hypotension: No Has patient had a PCN reaction causing severe rash involving mucus membranes or skin necrosis: No Has patient had a PCN reaction that required hospitalization: No Has patient had a PCN reaction occurring within the last 10 years: No If all of the above answers are "NO", then may proceed with Cephalosporin use. Has patient had a PCN  reaction causing immediate rash, facial/tongue/throat swelling, SOB or lightheadedness with hypotension: No Has patient had a PCN reaction causing severe rash involving mucus membranes or skin necrosis: No Has patient had a PCN reaction that required hospitalization: No Has patient had a PCN reaction occurring within the last 10 years: No If all of the above answers are "NO", then may proceed with Cephalosporin use.   Family History  Problem Relation Age of Onset  . Bladder Cancer Neg Hx   . Kidney cancer Neg Hx    Family history: Family history reviewed and not pertinent  Prior to Admission medications   Medication Sig Start Date End Date Taking? Authorizing Provider  acetaminophen (TYLENOL) 500 MG tablet Take 500 mg by mouth every 6 (six) hours as needed for mild pain.    [provider]  clonazePAM (KLONOPIN) 1 MG tablet Take 1 tablet (1 mg total) by mouth 2 (two) times daily. 10/02/20   Max Sane, MD  divalproex (DEPAKOTE ER) 500 MG 24 hr tablet Take 2 tablets (1,000 mg total) by mouth at bedtime. 01/11/20   Clapacs, Madie Reno, MD  enalapril (VASOTEC) 5 MG tablet Take 1 tablet (5 mg total) by mouth 2 (two) times daily. Patient taking differently: Take 10 mg by mouth 2 (two) times daily. 01/11/20   Clapacs, Madie Reno, MD  FLUoxetine (PROZAC) 20 MG capsule Take 60 mg by mouth daily.    [provider]  insulin detemir (LEVEMIR) 100 UNIT/ML injection Inject 0.15 mLs (15 Units total) into the skin daily. 07/31/20   Feliz  Marguarite Arbour, MD  levothyroxine (SYNTHROID) 88 MCG tablet Take 88 mcg by mouth daily before breakfast.    [provider]  linagliptin (TRADJENTA) 5 MG TABS tablet Take 5 mg by mouth daily.    [provider]  loratadine (CLARITIN) 10 MG tablet Take 1 tablet (10 mg total) by mouth daily. 01/11/20   Clapacs, Madie Reno, MD  lurasidone (LATUDA) 20 MG TABS tablet Take 20 mg by mouth daily. 08/17/20   [provider]  metFORMIN (GLUCOPHAGE) 500  MG tablet Take 2 tablets (1,000 mg total) by mouth 2 (two) times daily with a meal. 07/31/20   Charlynne Cousins, MD  ondansetron (ZOFRAN-ODT) 4 MG disintegrating tablet Take 4 mg by mouth every 6 (six) hours as needed for nausea. 08/16/20   [provider]  rosuvastatin (CRESTOR) 40 MG tablet Take 1 tablet (40 mg total) by mouth daily. 01/11/20   Clapacs, Madie Reno, MD   Physical Exam: Vitals:   10/23/20 1540 10/23/20 1542 10/23/20 1645  BP: 123/79  129/86  Pulse: 82  86  Resp: 16  16  Temp: 97.8 F (36.6 C)    TempSrc: Oral    SpO2: 94%  91%  Weight:  70.8 kg   Height:  5\' 3"  (1.6 m)    Constitutional: appears older than chronological age, NAD, calm, comfortable Eyes: PERRL, lids and conjunctivae normal ENMT: Mucous membranes are moist. Posterior pharynx clear of any exudate or lesions. Age-appropriate dentition. Hearing appropriate Neck: normal, supple, no masses, no thyromegaly Respiratory: clear to auscultation bilaterally, no wheezing, no crackles. Normal respiratory effort. No accessory muscle use.  Cardiovascular: Regular rate and rhythm, no murmurs / rubs / gallops. No extremity edema. 2+ pedal pulses. No carotid bruits.  Abdomen: no tenderness, no masses palpated, no hepatosplenomegaly. Bowel sounds positive.  Musculoskeletal: no clubbing / cyanosis. No joint deformity upper and lower extremities.  Bilateral lower extremity atrophy.  With decreased muscle tone.  Skin: no rashes, lesions, ulcers. No induration Neurologic: Sensation intact. Strength 5/5 in all 4.  Psychiatric: Alert and oriented x self.  Pleasant mood however unable to assess judgment or insight  EKG: independently reviewed, showing sinus rhythm with rate of 85, QTc 599  Chest x-ray on Admission: I personally reviewed and I agree with radiologist reading as below.  CT Head Wo Contrast  Result Date: 10/23/2020 CLINICAL DATA:  Weakness and lethargy. EXAM: CT HEAD WITHOUT CONTRAST TECHNIQUE: Contiguous  axial images were obtained from the base of the skull through the vertex without intravenous contrast. COMPARISON:  09/18/2020 FINDINGS: Brain: Moderate low density in the periventricular white matter likely related to small vessel disease. Cerebral atrophy is likely age appropriate. Mild ventriculomegaly is likely secondary. No mass lesion, hemorrhage, acute infarct, intra-axial, or extra-axial fluid collection. Vascular: No hyperdense vessel or unexpected calcification. Skull: No significant soft tissue swelling. Hyperostosis frontalis interna. Sinuses/Orbits: Normal imaged portions of the orbits and globes. Hypoplastic right frontal sinus. Otherwise normal paranasal sinuses and mastoid air cells. Other: None. IMPRESSION: 1. No acute intracranial abnormality. 2. Cerebral atrophy and small vessel ischemic change. Electronically Signed   By: Abigail Miyamoto M.D.   On: 10/23/2020 16:17   Labs on Admission: I have personally reviewed following labs  CBC: Recent Labs  Lab 10/23/20 1554  WBC 12.4*  HGB 15.1*  HCT 43.6  MCV 88.1  PLT 921   Basic Metabolic Panel: Recent Labs  Lab 10/23/20 1554  NA 137  K 3.9  CL 102  CO2 25  GLUCOSE 178*  BUN 25*  CREATININE 0.94  CALCIUM 9.3   GFR: Estimated Creatinine Clearance: 48.8 mL/min (by C-G formula based on SCr of 0.94 mg/dL).  Liver Function Tests: Recent Labs  Lab 10/23/20 1554  AST 19  ALT 12  ALKPHOS 44  BILITOT 0.6  PROT 6.6  ALBUMIN 3.0*   Urine analysis:    Component Value Date/Time   COLORURINE AMBER (A) 10/23/2020 1537   APPEARANCEUR TURBID (A) 10/23/2020 1537   APPEARANCEUR Clear 02/24/2017 1050   LABSPEC 1.028 10/23/2020 1537   LABSPEC 1.018 02/25/2014 0927   PHURINE 5.0 10/23/2020 1537   GLUCOSEU NEGATIVE 10/23/2020 1537   GLUCOSEU Negative 02/25/2014 0927   HGBUR NEGATIVE 10/23/2020 1537   BILIRUBINUR NEGATIVE 10/23/2020 1537   BILIRUBINUR Negative 02/24/2017 1050   BILIRUBINUR Negative 02/25/2014 0927   KETONESUR  5 (A) 10/23/2020 1537   PROTEINUR 100 (A) 10/23/2020 1537   NITRITE POSITIVE (A) 10/23/2020 1537   LEUKOCYTESUR LARGE (A) 10/23/2020 1537   LEUKOCYTESUR 3+ 02/25/2014 0927   Baldomero Mirarchi N Airlie Blumenberg D.O. [facility]  If 7PM-7AM, please contact overnight-coverage provider If 7AM-7PM, please contact day coverage provider [email or web address]  10/23/2020, 7:19 PM

## 2020-10-23 NOTE — ED Notes (Signed)
Pt not able to have BM on bedpan. Ext cath replaced and pt repositioned for comfort with knees supported by pillow and HOB elevated to eat.

## 2020-10-23 NOTE — ED Provider Notes (Signed)
[facility] Emergency Department Provider Note  Time seen: 3:37 PM  I have reviewed the triage vital signs and the nursing notes.   HISTORY  Chief Complaint Altered mental status  HPI [name] is a 75 y.o. female with a past medical history of diabetes, arthritis, hypertension, depression, bipolar, presents to the emergency department for altered mental state from a nursing facility.  According to EMS report patient is coming from her nursing facility for generalized weakness and altered mental status.  They state normally the patient is much more interactive and talkative, but today staff noted that the patient was more weak somnolent and not interacting.  Patient is unable to contribute any further to her history, unable to contribute to review of systems.  Patient oriented to person only currently.   Past Medical History:  Diagnosis Date  . Arthritis   . Breast cancer ([place])   . Cancer [facility]) 2004   right breast ca  . Diabetes mellitus without complication ([place])   . Hypercholesterolemia   . Hypertension   . Hypothyroid   . Seasonal allergies     Patient Active Problem List   Diagnosis Date Noted  . SOB (shortness of breath)   . Endotracheally intubated   . Acute respiratory failure with hypoxia ([place]) 09/16/2020  . Acute hypoxemic respiratory failure ([place]) 09/16/2020  . Hypomagnesemia   . Aspiration pneumonia ([place]) 09/15/2020  . Generalized weakness 08/22/2020  . UTI (urinary tract infection) 08/22/2020  . Unable to care for self 08/22/2020  . Bipolar 1 disorder ([place]) 08/22/2020  . Weakness 08/22/2020  . AKI (acute kidney injury) ([place]) 08/22/2020  . Bipolar disorder current episode depressed ([place]) 01/07/2020  . MDD (major depressive disorder) 08/10/2019  . MDD (major depressive disorder), recurrent episode, severe ([place]) 06/24/2019  . Chronic kidney disease, stage 3 unspecified ([place]) 05/11/2019  . Pain due to onychomycosis of toenails  of both feet 11/23/2018  . Bipolar 1 disorder, manic, moderate ([place]) 07/21/2018  . Bipolar 1 disorder, mixed, severe ([place]) 07/21/2018  . Bipolar 1 disorder with moderate mania ([place]) 07/20/2018  . Severe bipolar I disorder, current or most recent episode depressed ([place]) 07/07/2017  . PTSD (post-traumatic stress disorder) 07/07/2017  . 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 Type 2 diabetes mellitus with hyperlipidemia ([place]) 09/23/2014  . Essential hypertension 09/21/2014  . Hypothyroidism 09/21/2014  . Dyslipidemia 09/21/2014  . Bipolar I disorder, current or most recent episode manic, severe with mixed features ([place]) 09/20/2014    Past Surgical History:  Procedure Laterality Date  . ABDOMINAL HYSTERECTOMY    . BREAST BIOPSY Left    neg  . BREAST EXCISIONAL BIOPSY Right 2004   breast ca and lumpectomy  . BREAST LUMPECTOMY Right 2004   breast ca  . CHOLECYSTECTOMY    . COLONOSCOPY WITH PROPOFOL N/A 02/13/2017   Procedure: COLONOSCOPY WITH PROPOFOL;  Surgeon: Jonathon Bellows, MD;  Location: [facility];  Service: Gastroenterology;  Laterality: N/A;  . KIDNEY STONE SURGERY      Prior to Admission medications   Medication Sig Start Date End Date Taking? Authorizing Provider  acetaminophen (TYLENOL) 500 MG tablet Take 500 mg by mouth every 6 (six) hours as needed for mild pain.    [provider]  clonazePAM (KLONOPIN) 1 MG tablet Take 1 tablet (1 mg total) by mouth 2 (two) times daily. 10/02/20   Max Sane, MD  divalproex (DEPAKOTE ER) 500 MG 24 hr tablet Take 2 tablets (  1,000 mg total) by mouth at bedtime. 01/11/20   Clapacs, Madie Reno, MD  enalapril (VASOTEC) 5 MG tablet Take 1 tablet (5 mg total) by mouth 2 (two) times daily. Patient taking differently: Take 10 mg by mouth 2 (two) times daily. 01/11/20   Clapacs, Madie Reno, MD  FLUoxetine (PROZAC) 20 MG capsule Take 60 mg by mouth daily.    [provider]  insulin detemir (LEVEMIR)  100 UNIT/ML injection Inject 0.15 mLs (15 Units total) into the skin daily. 07/31/20   Charlynne Cousins, MD  levothyroxine (SYNTHROID) 88 MCG tablet Take 88 mcg by mouth daily before breakfast.    [provider]  linagliptin (TRADJENTA) 5 MG TABS tablet Take 5 mg by mouth daily.    [provider]  loratadine (CLARITIN) 10 MG tablet Take 1 tablet (10 mg total) by mouth daily. 01/11/20   Clapacs, Madie Reno, MD  lurasidone (LATUDA) 20 MG TABS tablet Take 20 mg by mouth daily. 08/17/20   [provider]  metFORMIN (GLUCOPHAGE) 500 MG tablet Take 2 tablets (1,000 mg total) by mouth 2 (two) times daily with a meal. 07/31/20   Charlynne Cousins, MD  ondansetron (ZOFRAN-ODT) 4 MG disintegrating tablet Take 4 mg by mouth every 6 (six) hours as needed for nausea. 08/16/20   [provider]  rosuvastatin (CRESTOR) 40 MG tablet Take 1 tablet (40 mg total) by mouth daily. 01/11/20   Clapacs, Madie Reno, MD    Allergies  Allergen Reactions  . Navane [Thiothixene] Other (See Comments)    Reaction:  Unknown  . Penicillins Rash    Has patient had a PCN reaction causing immediate rash, facial/tongue/throat swelling, SOB or lightheadedness with hypotension: No Has patient had a PCN reaction causing severe rash involving mucus membranes or skin necrosis: No Has patient had a PCN reaction that required hospitalization: No Has patient had a PCN reaction occurring within the last 10 years: No If all of the above answers are "NO", then may proceed with Cephalosporin use.  Has patient had a PCN reaction causing immediate rash, facial/tongue/throat swelling, SOB or lightheadedness with hypotension: No Has patient had a PCN reaction causing severe rash involving mucus membranes or skin necrosis: No Has patient had a PCN reaction that required hospitalization: No Has patient had a PCN reaction occurring within the last 10 years: No If all of the above answers are "NO", then may proceed  with Cephalosporin use. Has patient had a PCN reaction causing immediate rash, facial/tongue/throat swelling, SOB or lightheadedness with hypotension: No Has patient had a PCN reaction causing severe rash involving mucus membranes or skin necrosis: No Has patient had a PCN reaction that required hospitalization: No Has patient had a PCN reaction occurring within the last 10 years: No If all of the above answers are "NO", then may proceed with Cephalosporin use.    Family History  Problem Relation Age of Onset  . Bladder Cancer Neg Hx   . Kidney cancer Neg Hx     Social History Social History   Tobacco Use  . Smoking status: Former Smoker    Types: Cigarettes  . Smokeless tobacco: Never Used  Vaping Use  . Vaping Use: Never used  Substance Use Topics  . Alcohol use: No  . Drug use: No    Review of Systems Unable to obtain adequate/accurate review of systems secondary to altered mental status ____________________________________________   PHYSICAL EXAM:  VITAL SIGNS: ED Triage Vitals  Enc Vitals Group  BP      Pulse      Resp      Temp      Temp src      SpO2      Weight      Height      Head Circumference      Peak Flow      Pain Score      Pain Loc      Pain Edu?      Excl. in [place]?     Constitutional: Patient is somnolent but does awaken to voice.  Will attempt to answer questions with mostly incomprehensible words.  Is oriented to person only. Eyes: Normal exam ENT      Head: Normocephalic and atraumatic.      Mouth/Throat: Mucous membranes are moist. Cardiovascular: Normal rate, regular rhythm.  Respiratory: Normal respiratory effort without tachypnea nor retractions. Breath sounds are clear  Gastrointestinal: Soft and nontender. No distention.   Musculoskeletal: Nontender with normal range of motion in all extremities.  Neurologic: Mumbling type speech difficult to understand for the most part.  Appears to move all extremities at times. Skin:  Skin  is warm, dry Psychiatric: Flat affect  ____________________________________________    EKG  EKG viewed and interpreted by myself shows a sinus rhythm 85 bpm with a narrow QRS, normal axis, normal intervals besides QTC prolongation nonspecific ST changes with no ST elevation.  ____________________________________________    RADIOLOGY  CT head is negative  ____________________________________________   INITIAL IMPRESSION / ASSESSMENT AND PLAN / ED COURSE  Pertinent labs & imaging results that were available during my care of the patient were reviewed by me and considered in my medical decision making (see chart for details).   Patient presents emergency department for altered mental status, weakness coming from a nursing facility.  We will check labs, urinalysis, CT scan of the head, COVID and continue to closely monitor.  Vital signs from EMS are reassuring with a normal CBG.  Differential is quite broad at this time but would include metabolic or electrolyte abnormality, infectious etiology, CVA/ICH or COVID.  Patient's work-up is consistent with a urinary tract infection, lab work is otherwise nonrevealing.  Given the patient's change in mental state with weakness and disorientation we will start the patient on IV Rocephin, send urine culture and admit to the hospital service for further work-up and treatment.  Zaina Jenkin was evaluated in Emergency Department on 10/23/2020 for the symptoms described in the history of present illness. She was evaluated in the context of the global COVID-19 pandemic, which necessitated consideration that the patient might be at risk for infection with the SARS-CoV-2 virus that causes COVID-19. Institutional protocols and algorithms that pertain to the evaluation of patients at risk for COVID-19 are in a state of rapid change based on information released by regulatory bodies including the CDC and federal and state organizations. These policies  and algorithms were followed during the patient's care in the ED.  ____________________________________________   FINAL CLINICAL IMPRESSION(S) / ED DIAGNOSES  Altered mental status Urinary tract infection   Harvest Dark, MD 10/23/20 [phone]

## 2020-10-23 NOTE — ED Notes (Signed)
Pt placed on bedpan to have BM °

## 2020-10-23 NOTE — Progress Notes (Signed)
[facility] ED11 [facility] Hospital Liaison note:  This patient is currently enrolled in [facility] outpatient-based Palliative Care. Will continue to follow for disposition.  Please call with any outpatient palliative questions or concerns.  Thank you, Dee Curry, LPN [facility] Hospital Liaison [phone] 

## 2020-10-23 NOTE — ED Notes (Signed)
[place] called by this RN to verify preexisting diet restriction and how pt normally takes meds. Charolett Bumpers at [facility] confirmed that pt takes pills whole and is able to eat a regular minced diet.

## 2020-10-23 NOTE — ED Notes (Signed)
Procal added on in lab.

## 2020-10-23 NOTE — ED Notes (Signed)
Pt in radiology dept.  

## 2020-10-23 NOTE — ED Triage Notes (Signed)
Pt here via [facility] from [facility] care with weakness and lethargic. Pt states she does not feel well. Hx UTI and DM. 13/72, 192-cbg, 94% RA, 85.

## 2020-10-23 NOTE — ED Notes (Signed)
Pills given one at a time due to pt holding them in mouth.

## 2020-10-24 ENCOUNTER — Encounter: Payer: Self-pay | Admitting: Internal Medicine

## 2020-10-24 ENCOUNTER — Other Ambulatory Visit: Payer: Self-pay

## 2020-10-24 DIAGNOSIS — N390 Urinary tract infection, site not specified: Secondary | ICD-10-CM | POA: Diagnosis not present

## 2020-10-24 DIAGNOSIS — R531 Weakness: Secondary | ICD-10-CM | POA: Diagnosis not present

## 2020-10-24 DIAGNOSIS — R4182 Altered mental status, unspecified: Secondary | ICD-10-CM | POA: Diagnosis not present

## 2020-10-24 LAB — GLUCOSE, CAPILLARY
Glucose-Capillary: 127 mg/dL — ABNORMAL HIGH (ref 70–99)
Glucose-Capillary: 163 mg/dL — ABNORMAL HIGH (ref 70–99)
Glucose-Capillary: 85 mg/dL (ref 70–99)

## 2020-10-24 LAB — CBC
HCT: 41.8 % (ref 36.0–46.0)
Hemoglobin: 14.4 g/dL (ref 12.0–15.0)
MCH: 30.9 pg (ref 26.0–34.0)
MCHC: 34.4 g/dL (ref 30.0–36.0)
MCV: 89.7 fL (ref 80.0–100.0)
Platelets: 245 10*3/uL (ref 150–400)
RBC: 4.66 MIL/uL (ref 3.87–5.11)
RDW: 13.6 % (ref 11.5–15.5)
WBC: 12.2 10*3/uL — ABNORMAL HIGH (ref 4.0–10.5)
nRBC: 0 % (ref 0.0–0.2)

## 2020-10-24 LAB — HEMOGLOBIN A1C
Hgb A1c MFr Bld: 7 % — ABNORMAL HIGH (ref 4.8–5.6)
Mean Plasma Glucose: 154 mg/dL

## 2020-10-24 LAB — BASIC METABOLIC PANEL
Anion gap: 9 (ref 5–15)
BUN: 23 mg/dL (ref 8–23)
CO2: 25 mmol/L (ref 22–32)
Calcium: 9.1 mg/dL (ref 8.9–10.3)
Chloride: 100 mmol/L (ref 98–111)
Creatinine, Ser: 0.79 mg/dL (ref 0.44–1.00)
GFR, Estimated: >60 mL/min (ref >60)
Glucose, Bld: 128 mg/dL — ABNORMAL HIGH (ref 70–99)
Potassium: 3.2 mmol/L — ABNORMAL LOW (ref 3.5–5.1)
Sodium: 134 mmol/L — ABNORMAL LOW (ref 135–145)

## 2020-10-24 LAB — CBG MONITORING, ED: Glucose-Capillary: 115 mg/dL — ABNORMAL HIGH (ref 70–99)

## 2020-10-24 LAB — MAGNESIUM: Magnesium: 1.8 mg/dL (ref 1.7–2.4)

## 2020-10-24 MED ORDER — POTASSIUM CHLORIDE CRYS ER 20 MEQ PO TBCR
40.0000 meq | EXTENDED_RELEASE_TABLET | Freq: Once | ORAL | Status: AC
  Administered 2020-10-24: 40 meq via ORAL
  Filled 2020-10-24: qty 2

## 2020-10-24 MED ORDER — LACTATED RINGERS IV SOLN: INTRAVENOUS | Status: DC

## 2020-10-24 MED ORDER — MAGNESIUM SULFATE 2 GM/50ML IV SOLN
2.0000 g | Freq: Once | INTRAVENOUS | Status: AC
  Administered 2020-10-24: 2 g via INTRAVENOUS
  Filled 2020-10-24: qty 50

## 2020-10-24 NOTE — Evaluation (Signed)
Physical Therapy Evaluation Patient Details Name: [name] MRN: [id number] DOB: [birth year] Today's Date: 10/24/2020   History of Present Illness  [name] is a 60yoF who comes [facility] on 10/23/20 from facility [facility]. PMH: HTN, DM, hypothyroidism, bipolar 1 disorder,hypoTSH, IDDM.  Clinical Impression  Pt admitted with above diagnosis. Pt currently with functional limitations due to the deficits listed below (see "PT Problem List"). Upon entry, pt in bed, awake and agreeable to participate. The pt is alert, pleasant, somewhat oriented but appears generally frozen, delayed in response, slow speech iin general, does not initiate movement when cued. Pt is rigid in 4 limbs and trunk with pain when author attempts to assess ROM. Pt requires totalA to/from EOB, and is never able to perform trunk righting to achieve unsupported seated balance. Patient's performance this date reveals decreased ability, independence, and tolerance in performing all basic mobility required for performance of activities of daily living. Pt requires additional DME, close physical assistance, and cues for safe participate in mobility. Pt will benefit from skilled PT intervention to increase independence and safety with basic mobility in preparation for discharge to the venue listed below.       Follow Up Recommendations SNF;Supervision/Assistance - 24 hour    Equipment Recommendations  None recommended by PT    Recommendations for Other Services       Precautions / Restrictions Precautions Precautions: Fall Restrictions Weight Bearing Restrictions: No      Mobility  Bed Mobility Overal bed mobility: Needs Assistance Bed Mobility: Supine to Sit;Sit to Supine     Supine to sit: Total assist;+2 for physical assistance Sit to supine: Total assist;+2 for physical assistance   General bed mobility comments: never able to sit independently, does not inititate movement when cued; continuous lean, no trunk  righting responses. Sits at EOB x 6 minutes with continuous maxA support of trunk due to Rt trunk lean.    Transfers                    Ambulation/Gait                Stairs            Wheelchair Mobility    Modified Rankin (Stroke Patients Only)       Balance                                             Pertinent Vitals/Pain Pain Assessment: Faces Faces Pain Scale: Hurts even more Pain Location: legs with attempted stretching out/reposition Pain Intervention(s): Monitored during session;Repositioned    Home Living Family/patient expects to be discharged to:: Skilled nursing facility                      Prior Function                 Hand Dominance        Extremity/Trunk Assessment        Lower Extremity Assessment Lower Extremity Assessment:  (rigid and flexed hips and knees, difficult to reposition, pt in pain when attempted.)       Communication      Cognition Arousal/Alertness: Lethargic Behavior During Therapy: Flat affect (delayed reaction time, flat affect, slow speech, substantially impaired movement inititation) Overall Cognitive Status: Difficult to assess  General Comments: [facility]ed to [facility], 2022, self (no additional questions asked)      General Comments      Exercises     Assessment/Plan    PT Assessment Patient needs continued PT services  PT Problem List Decreased strength;Decreased activity tolerance;Decreased balance;Decreased mobility;Decreased knowledge of use of DME;Decreased coordination       PT Treatment Interventions DME instruction;Gait training;Functional mobility training;Therapeutic activities;Therapeutic exercise;Balance training;Patient/family education;Manual techniques;Wheelchair mobility training;Cognitive remediation;Neuromuscular re-education    PT Goals (Current goals can be found in the Care Plan section)   Acute Rehab PT Goals PT Goal Formulation: Patient unable to participate in goal setting Time For Goal Achievement: 11/07/20    Frequency Min 2X/week   Barriers to discharge        Co-evaluation               AM-PAC PT "6 Clicks" Mobility  Outcome Measure Help needed turning from your back to your side while in a flat bed without using bedrails?: Total Help needed moving from lying on your back to sitting on the side of a flat bed without using bedrails?: Total Help needed moving to and from a bed to a chair (including a wheelchair)?: Total Help needed standing up from a chair using your arms (e.g., wheelchair or bedside chair)?: Total Help needed to walk in hospital room?: Total Help needed climbing 3-5 steps with a railing? : Total 6 Click Score: 6    End of Session   Activity Tolerance: Patient limited by lethargy;Other (comment) (level of arousal.) Patient left: in bed;with bed alarm set   PT Visit Diagnosis: Muscle weakness (generalized) (M62.81);Other symptoms and signs involving the nervous system (R29.898)    Time: 8295-6213 PT Time Calculation (min) (ACUTE ONLY): 11 min   Charges:   PT Evaluation $PT Eval Moderate Complexity: 1 Mod          4:00 PM, 10/24/20 Etta Grandchild, PT, DPT Physical Therapist - [facility]  [phone] ([place])   Montara C 10/24/2020, 3:58 PM

## 2020-10-24 NOTE — Clinical Social Work Note (Addendum)
Patient only oriented to self. Per MD note, family does not want to participate in her care. Unable to provide observation letter at this time.  Dayton Scrape, CSW [phone]  3:11 pm: CSW acknowledges consult for potential transition to long-term care at [facility] [place] admissions coordinator confirmed they would be able to transition her to long-term care if needed. Palliative consult pending for goals of care.  Dayton Scrape, [place]

## 2020-10-24 NOTE — Progress Notes (Addendum)
PROGRESS NOTE    [name]  VOJ:[id number] DOB: 1946/03/27 DOA: 10/23/2020 PCP: Mechele Claude, FNP   Brief Narrative: Taken from H&P. [name] is a 75 y.o. female with medical history significant for bipolar 1 with history of moderate mania, hypertension, acquired hypothyroid, dyslipidemia, depression, history of endotracheal intubation, history of respiratory failure, history of acute kidney injury, insulin-dependent diabetes mellitus, on metformin, presents to the emergency department from facility for chief concerns of altered mentation. Recent hospitalization from 09/15/2020 to 10/02/2020: For shortness of breath and acute hypoxemic respiratory failure requiring endotracheal intubation secondary to aspiration pneumonia. She was discharged to rehab.  Concern of underlying dementia. Apparently family does not want to participate in her care and lives in [place]. Talked with 1 family friend listed in her chart-she is confused how to help her.  She does not have any POA.  According to her at baseline patient is mostly oriented to self and able to recognize her whenever she visited her.  She was not aware of any recent changes.  She was found to have mild leukocytosis and UA with pyuria, bacteriuria and positive nitrites.  Started on ceftriaxone with cultures pending.  Subjective: Patient is alert when seen today.  Able to tell her name, stating that she is 75 year old and she is in [place].  Appears confused.  Denies any pain.  Per nursing staff poor p.o. intake.  Assessment & Plan:   Active Problems:   Essential hypertension   Hypothyroidism   Severe bipolar I disorder, current or most recent episode depressed ([place])   MDD (major depressive disorder), recurrent episode, severe ([place])   MDD (major depressive disorder)   Unable to care for self   Bipolar 1 disorder ([place])   Altered mental status  Altered mental status most likely secondary to UTI.  Unknown  baseline but appears to have underlying dementia.  Urine cultures pending. -Continue with ceftriaxone-we will de-escalate antibiotics once culture results are available. -TOC and palliative care consult for social reasons and patient is full code.  Hypertension.  Blood pressure within goal. -Continue home dose of enalapril  Hypokalemia.  Magnesium at 1.8 -Replete electrolytes and monitor.  Hyperlipidemia. -Continue home dose of rosuvastatin  Insulin-dependent diabetes mellitus.  She was on long-acting and metformin at home along with linagliptin. -Continue linagliptin and home metformin -Continue with basal and SSI  Anxiety/depression/psychiatric imbalance. -Continue home meds.  Hypothyroidism. -Continue Synthroid  Here recent history of aspiration pneumonia. -Aspiration precautions -Incentive spirometry and flutter valve  Stage II sacral pressure injury, POA -Continue with wound care  Objective: Vitals:   10/24/20 0600 10/24/20 0711 10/24/20 0717 10/24/20 0958  BP: 111/72  119/75 109/66  Pulse: 81 82 81 84  Resp: 18 16    Temp:   98.9 F (37.2 C) 97.9 F (36.6 C)  TempSrc:   Oral   SpO2: 92% 99% 92% 96%  Weight:      Height:        Intake/Output Summary (Last 24 hours) at 10/24/2020 1323 Last data filed at 10/23/2020 2050 Gross per 24 hour  Intake 92.17 ml  Output 15 ml  Net 77.17 ml   Filed Weights   10/23/20 1542  Weight: 70.8 kg    Examination:  General exam: Chronically ill-appearing elderly lady, appears calm and comfortable  Respiratory system: Clear to auscultation. Respiratory effort normal. Cardiovascular system: S1 & S2 heard, RRR.  Gastrointestinal system: Soft, nontender, nondistended, bowel sounds positive. Central nervous system: Alert but oriented to name  only,. No focal neurological deficits. Extremities: No edema, no cyanosis, pulses intact and symmetrical. Psychiatry: Judgement and insight appear impaired.   DVT prophylaxis:  Lovenox Code Status: Full Family Communication: Discussed with a family friend on phone.  Unable to reach any family. Disposition Plan:  Status is: Observation  The patient remains OBS appropriate and will d/c before 2 midnights.  Dispo: The patient is from: SNF              Anticipated d/c is to: SNF              Patient currently is not medically stable to d/c.   Difficult to place patient No              Level of care: Med-Surg  All the records are reviewed and case discussed with Care Management/Social Worker. Management plans discussed with the patient, nursing and they are in agreement.  Consultants:   Palliative care  Procedures:  Antimicrobials:  Ceftriaxone  Data Reviewed: I have personally reviewed following labs and imaging studies  CBC: Recent Labs  Lab 10/23/20 1554 10/24/20 0623  WBC 12.4* 12.2*  HGB 15.1* 14.4  HCT 43.6 41.8  MCV 88.1 89.7  PLT 237 865   Basic Metabolic Panel: Recent Labs  Lab 10/23/20 1554 10/24/20 0623  NA 137 134*  K 3.9 3.2*  CL 102 100  CO2 25 25  GLUCOSE 178* 128*  BUN 25* 23  CREATININE 0.94 0.79  CALCIUM 9.3 9.1  MG  --  1.8   GFR: Estimated Creatinine Clearance: 57.4 mL/min (by C-G formula based on SCr of 0.79 mg/dL). Liver Function Tests: Recent Labs  Lab 10/23/20 1554  AST 19  ALT 12  ALKPHOS 44  BILITOT 0.6  PROT 6.6  ALBUMIN 3.0*   No results for input(s): LIPASE, AMYLASE in the last 168 hours. No results for input(s): AMMONIA in the last 168 hours. Coagulation Profile: No results for input(s): INR, PROTIME in the last 168 hours. Cardiac Enzymes: No results for input(s): CKTOTAL, CKMB, CKMBINDEX, TROPONINI in the last 168 hours. BNP (last 3 results) No results for input(s): PROBNP in the last 8760 hours. HbA1C: No results for input(s): HGBA1C in the last 72 hours. CBG: Recent Labs  Lab 10/23/20 2156 10/24/20 0735 10/24/20 1257  GLUCAP 274* 115* 127*   Lipid Profile: No results for  input(s): CHOL, HDL, LDLCALC, TRIG, CHOLHDL, LDLDIRECT in the last 72 hours. Thyroid Function Tests: No results for input(s): TSH, T4TOTAL, FREET4, T3FREE, THYROIDAB in the last 72 hours. Anemia Panel: No results for input(s): VITAMINB12, FOLATE, FERRITIN, TIBC, IRON, RETICCTPCT in the last 72 hours. Sepsis Labs: Recent Labs  Lab 10/23/20 1737  PROCALCITON 0.11    Recent Results (from the past 240 hour(s))  Resp Panel by RT-PCR (Flu A&B, Covid) Nasopharyngeal Swab     Status: None   Collection Time: 10/23/20  3:54 PM   Specimen: Nasopharyngeal Swab; Nasopharyngeal(NP) swabs in vial transport medium  Result Value Ref Range Status   SARS Coronavirus 2 by RT PCR NEGATIVE NEGATIVE Final    Comment: (NOTE) SARS-CoV-2 target nucleic acids are NOT DETECTED.  The SARS-CoV-2 RNA is generally detectable in upper respiratory specimens during the acute phase of infection. The lowest concentration of SARS-CoV-2 viral copies this assay can detect is 138 copies/mL. A negative result does not preclude SARS-Cov-2 infection and should not be used as the sole basis for treatment or other patient management decisions. A negative result may occur with  improper  specimen collection/handling, submission of specimen other than nasopharyngeal swab, presence of viral mutation(s) within the areas targeted by this assay, and inadequate number of viral copies(<138 copies/mL). A negative result must be combined with clinical observations, patient history, and epidemiological information. The expected result is Negative.  Fact Sheet for Patients:  EntrepreneurPulse.com.au  Fact Sheet for Healthcare Providers:  IncredibleEmployment.be  This test is no t yet approved or cleared by the [place] FDA and  has been authorized for detection and/or diagnosis of SARS-CoV-2 by FDA under an Emergency Use Authorization (EUA). This EUA will remain  in effect (meaning this test  can be used) for the duration of the COVID-19 declaration under Section 564(b)(1) of the Act, 21 U.S.C.section 360bbb-3(b)(1), unless the authorization is terminated  or revoked sooner.       Influenza A by PCR NEGATIVE NEGATIVE Final   Influenza B by PCR NEGATIVE NEGATIVE Final    Comment: (NOTE) The Xpert Xpress SARS-CoV-2/FLU/RSV plus assay is intended as an aid in the diagnosis of influenza from Nasopharyngeal swab specimens and should not be used as a sole basis for treatment. Nasal washings and aspirates are unacceptable for Xpert Xpress SARS-CoV-2/FLU/RSV testing.  Fact Sheet for Patients: EntrepreneurPulse.com.au  Fact Sheet for Healthcare Providers: IncredibleEmployment.be  This test is not yet approved or cleared by the [place] FDA and has been authorized for detection and/or diagnosis of SARS-CoV-2 by FDA under an Emergency Use Authorization (EUA). This EUA will remain in effect (meaning this test can be used) for the duration of the COVID-19 declaration under Section 564(b)(1) of the Act, 21 U.S.C. section 360bbb-3(b)(1), unless the authorization is terminated or revoked.  Performed at [facility], [address] [place], [place] [address]      Radiology Studies: CT Head Wo Contrast  Result Date: 10/23/2020 CLINICAL DATA:  Weakness and lethargy. EXAM: CT HEAD WITHOUT CONTRAST TECHNIQUE: Contiguous axial images were obtained from the base of the skull through the vertex without intravenous contrast. COMPARISON:  09/18/2020 FINDINGS: Brain: Moderate low density in the periventricular white matter likely related to small vessel disease. Cerebral atrophy is likely age appropriate. Mild ventriculomegaly is likely secondary. No mass lesion, hemorrhage, acute infarct, intra-axial, or extra-axial fluid collection. Vascular: No hyperdense vessel or unexpected calcification. Skull: No significant soft tissue swelling.  Hyperostosis frontalis interna. Sinuses/Orbits: Normal imaged portions of the orbits and globes. Hypoplastic right frontal sinus. Otherwise normal paranasal sinuses and mastoid air cells. Other: None. IMPRESSION: 1. No acute intracranial abnormality. 2. Cerebral atrophy and small vessel ischemic change. Electronically Signed   By: Abigail Miyamoto M.D.   On: 10/23/2020 16:17   DG Chest Port 1 View  Result Date: 10/23/2020 CLINICAL DATA:  Weakness, lethargy EXAM: PORTABLE CHEST 1 VIEW COMPARISON:  09/20/2020 FINDINGS: Mild bibasilar scarring/atelectasis. No focal consolidation. No pleural effusion or pneumothorax. Heart is normal in size. IMPRESSION: No evidence of acute cardiopulmonary disease. Electronically Signed   By: Julian Hy M.D.   On: 10/23/2020 20:46    Scheduled Meds: . divalproex  1,000 mg Oral QHS  . enalapril  5 mg Oral BID  . enoxaparin (LOVENOX) injection  40 mg Subcutaneous Q24H  . FLUoxetine  60 mg Oral Daily  . insulin aspart  0-15 Units Subcutaneous TID WC  . insulin aspart  0-5 Units Subcutaneous QHS  . insulin detemir  15 Units Subcutaneous QHS  . levothyroxine  88 mcg Oral QAC breakfast  . linagliptin  5 mg Oral Daily  . lurasidone  20  mg Oral Daily  . metFORMIN  1,000 mg Oral BID WC  . rosuvastatin  40 mg Oral QHS   Continuous Infusions: . cefTRIAXone (ROCEPHIN)  IV    . lactated ringers       LOS: 0 days   Time spent: 40 minutes More than 50% of the time was spent in counseling/coordination of care  Lorella Nimrod, MD [facility]  If 7PM-7AM, please contact night-coverage [email or web address]  10/24/2020, 1:23 PM   This record has been created using Systems analyst. Errors have been sought and corrected,but may not always be located. Such creation errors do not reflect on the standard of care.

## 2020-10-25 DIAGNOSIS — G9341 Metabolic encephalopathy: Secondary | ICD-10-CM | POA: Diagnosis present

## 2020-10-25 DIAGNOSIS — F419 Anxiety disorder, unspecified: Secondary | ICD-10-CM | POA: Diagnosis present

## 2020-10-25 DIAGNOSIS — Z853 Personal history of malignant neoplasm of breast: Secondary | ICD-10-CM | POA: Diagnosis not present

## 2020-10-25 DIAGNOSIS — F319 Bipolar disorder, unspecified: Secondary | ICD-10-CM | POA: Diagnosis not present

## 2020-10-25 DIAGNOSIS — N390 Urinary tract infection, site not specified: Secondary | ICD-10-CM | POA: Diagnosis present

## 2020-10-25 DIAGNOSIS — I959 Hypotension, unspecified: Secondary | ICD-10-CM | POA: Diagnosis present

## 2020-10-25 DIAGNOSIS — F314 Bipolar disorder, current episode depressed, severe, without psychotic features: Secondary | ICD-10-CM | POA: Diagnosis present

## 2020-10-25 DIAGNOSIS — Z7189 Other specified counseling: Secondary | ICD-10-CM | POA: Diagnosis not present

## 2020-10-25 DIAGNOSIS — E876 Hypokalemia: Secondary | ICD-10-CM | POA: Diagnosis present

## 2020-10-25 DIAGNOSIS — Z888 Allergy status to other drugs, medicaments and biological substances status: Secondary | ICD-10-CM | POA: Diagnosis not present

## 2020-10-25 DIAGNOSIS — Z794 Long term (current) use of insulin: Secondary | ICD-10-CM | POA: Diagnosis not present

## 2020-10-25 DIAGNOSIS — E7800 Pure hypercholesterolemia, unspecified: Secondary | ICD-10-CM | POA: Diagnosis present

## 2020-10-25 DIAGNOSIS — Z880 Allergy status to penicillin: Secondary | ICD-10-CM | POA: Diagnosis not present

## 2020-10-25 DIAGNOSIS — Z20822 Contact with and (suspected) exposure to covid-19: Secondary | ICD-10-CM | POA: Diagnosis present

## 2020-10-25 DIAGNOSIS — F0390 Unspecified dementia without behavioral disturbance: Secondary | ICD-10-CM | POA: Diagnosis present

## 2020-10-25 DIAGNOSIS — E119 Type 2 diabetes mellitus without complications: Secondary | ICD-10-CM | POA: Diagnosis present

## 2020-10-25 DIAGNOSIS — R9431 Abnormal electrocardiogram [ECG] [EKG]: Secondary | ICD-10-CM | POA: Diagnosis present

## 2020-10-25 DIAGNOSIS — Z79890 Hormone replacement therapy (postmenopausal): Secondary | ICD-10-CM | POA: Diagnosis not present

## 2020-10-25 DIAGNOSIS — Z87891 Personal history of nicotine dependence: Secondary | ICD-10-CM | POA: Diagnosis not present

## 2020-10-25 DIAGNOSIS — L89152 Pressure ulcer of sacral region, stage 2: Secondary | ICD-10-CM | POA: Diagnosis present

## 2020-10-25 DIAGNOSIS — I10 Essential (primary) hypertension: Secondary | ICD-10-CM | POA: Diagnosis present

## 2020-10-25 DIAGNOSIS — Z79899 Other long term (current) drug therapy: Secondary | ICD-10-CM | POA: Diagnosis not present

## 2020-10-25 DIAGNOSIS — F325 Major depressive disorder, single episode, in full remission: Secondary | ICD-10-CM | POA: Diagnosis not present

## 2020-10-25 DIAGNOSIS — E039 Hypothyroidism, unspecified: Secondary | ICD-10-CM | POA: Diagnosis present

## 2020-10-25 DIAGNOSIS — R410 Disorientation, unspecified: Secondary | ICD-10-CM | POA: Diagnosis not present

## 2020-10-25 DIAGNOSIS — Z7984 Long term (current) use of oral hypoglycemic drugs: Secondary | ICD-10-CM | POA: Diagnosis not present

## 2020-10-25 LAB — CBC
HCT: 42.3 % (ref 36.0–46.0)
Hemoglobin: 14.6 g/dL (ref 12.0–15.0)
MCH: 30.9 pg (ref 26.0–34.0)
MCHC: 34.5 g/dL (ref 30.0–36.0)
MCV: 89.4 fL (ref 80.0–100.0)
Platelets: 244 10*3/uL (ref 150–400)
RBC: 4.73 MIL/uL (ref 3.87–5.11)
RDW: 13.6 % (ref 11.5–15.5)
WBC: 12.3 10*3/uL — ABNORMAL HIGH (ref 4.0–10.5)
nRBC: 0 % (ref 0.0–0.2)

## 2020-10-25 LAB — BASIC METABOLIC PANEL
Anion gap: 12 (ref 5–15)
BUN: 19 mg/dL (ref 8–23)
CO2: 26 mmol/L (ref 22–32)
Calcium: 9.2 mg/dL (ref 8.9–10.3)
Chloride: 98 mmol/L (ref 98–111)
Creatinine, Ser: 0.87 mg/dL (ref 0.44–1.00)
GFR, Estimated: >60 mL/min (ref >60)
Glucose, Bld: 106 mg/dL — ABNORMAL HIGH (ref 70–99)
Potassium: 4.1 mmol/L (ref 3.5–5.1)
Sodium: 136 mmol/L (ref 135–145)

## 2020-10-25 LAB — GLUCOSE, CAPILLARY
Glucose-Capillary: 113 mg/dL — ABNORMAL HIGH (ref 70–99)
Glucose-Capillary: 137 mg/dL — ABNORMAL HIGH (ref 70–99)
Glucose-Capillary: 81 mg/dL (ref 70–99)
Glucose-Capillary: 113 mg/dL — ABNORMAL HIGH (ref 70–99)

## 2020-10-25 NOTE — Clinical Social Work Note (Signed)
RE: [name] Date of Birth: Feb 11, 1946 Date: 10/25/2020   To Whom It May Concern:  Please be advised that the above-named patient will require a short-term nursing home stay - anticipated 30 days or less for rehabilitation and strengthening.  The plan is for return home.

## 2020-10-25 NOTE — TOC Initial Note (Addendum)
Transition of Care [name]) - Initial/Assessment Note    Patient Details  Name: [name] MRN: [id number] Date of Birth: [birth year]  [facility] CM/SW Contact:    Candie Chroman, LCSW Phone Number: 10/25/2020, 10:39 AM  Clinical Narrative:  Per MD, patient is stable to discharge back to [facility] today. SNF admissions coordinator is aware. Patient's insurance is no longer managed by [facility] as of 5/31 so SNF will have to start insurance authorization through [employer]. PASARR expired in April so have started new screening and uploaded documents into [facility]. COVID test done on 6/6 and was negative.              12:58 pm: PASARR under Level 2 review. Staff member from [facility] will have to assess patient to determine appropriate PASARR level to provide.  2:58 pm: PASARR assessment scheduled for tomorrow at 2:00 with Renee Rival.  Expected Discharge Plan: Skilled Nursing Facility Barriers to Discharge: [place] (PASRR),Insurance Authorization   Patient Goals and CMS Choice Patient states their goals for this hospitalization and ongoing recovery are:: Patient disoriented x 4   Choice offered to / list presented to : NA  Expected Discharge Plan and Services Expected Discharge Plan: [place] Acute Care Choice: Resumption of Svcs/PTA Provider Living arrangements for the past 2 months: [place]                                      Prior Living Arrangements/Services Living arrangements for the past 2 months: [place] Lives with:: Facility Resident Patient language and need for interpreter reviewed:: Yes        Need for Family Participation in Patient Care: Yes (Comment) Care giver support system in place?: Yes (comment)   Criminal Activity/Legal Involvement Pertinent to Current Situation/Hospitalization: No - Comment as needed  Activities of Daily  Living Home Assistive Devices/Equipment: None ADL Screening (condition at time of admission) Patient's cognitive ability adequate to safely complete daily activities?: No Is the patient deaf or have difficulty hearing?: Yes Does the patient have difficulty seeing, even when wearing glasses/contacts?: No Does the patient have difficulty concentrating, remembering, or making decisions?: Yes Patient able to express need for assistance with ADLs?: No Does the patient have difficulty dressing or bathing?: Yes Independently performs ADLs?: No Communication: Independent Dressing (OT): Needs assistance Grooming: Needs assistance Is this a change from baseline?: Pre-admission baseline Feeding: Needs assistance Is this a change from baseline?: Pre-admission baseline Bathing: Dependent Is this a change from baseline?: Pre-admission baseline Toileting: Needs assistance Is this a change from baseline?: Pre-admission baseline In/Out Bed: Dependent Is this a change from baseline?: Pre-admission baseline Walks in Home: Dependent Is this a change from baseline?: Pre-admission baseline Does the patient have difficulty walking or climbing stairs?: No Weakness of Legs: Both Weakness of Arms/Hands: Both  Permission Sought/Granted Permission sought to share information with : Facility Arts administrator granted to share info w AGENCY: [facility]        Emotional Assessment Appearance:: Appears stated age Attitude/Demeanor/Rapport: Unable to Assess Affect (typically observed): Unable to Assess Orientation: :  (Disoriented x 4) Alcohol / Substance Use: Not Applicable Psych Involvement: No (comment)  Admission diagnosis:  Lower urinary tract infectious disease [N39.0] Altered mental status [R41.82] Weakness [R53.1] Altered mental  status, unspecified altered mental status type [R41.82] Patient Active Problem List   Diagnosis Date Noted  . Altered mental  status 10/23/2020  . SOB (shortness of breath)   . Endotracheally intubated   . Acute respiratory failure with hypoxia ([place]) 09/16/2020  . Acute hypoxemic respiratory failure ([place]) 09/16/2020  . Hypomagnesemia   . Aspiration pneumonia ([place]) 09/15/2020  . Generalized weakness 08/22/2020  . Lower urinary tract infectious disease 08/22/2020  . Unable to care for self 08/22/2020  . Bipolar 1 disorder ([place]) 08/22/2020  . Weakness 08/22/2020  . AKI (acute kidney injury) ([place]) 08/22/2020  . Bipolar disorder current episode depressed ([place]) 01/07/2020  . MDD (major depressive disorder) 08/10/2019  . MDD (major depressive disorder), recurrent episode, severe ([place]) 06/24/2019  . Chronic kidney disease, stage 3 unspecified ([place]) 05/11/2019  . Pain due to onychomycosis of toenails of both feet 11/23/2018  . Bipolar 1 disorder, manic, moderate ([place]) 07/21/2018  . Bipolar 1 disorder, mixed, severe ([place]) 07/21/2018  . Bipolar 1 disorder with moderate mania ([place]) 07/20/2018  . Severe bipolar I disorder, current or most recent episode depressed ([place]) 07/07/2017  . PTSD (post-traumatic stress disorder) 07/07/2017  . 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 Type 2 diabetes mellitus with hyperlipidemia ([place]) 09/23/2014  . Essential hypertension 09/21/2014  . Hypothyroidism 09/21/2014  . Dyslipidemia 09/21/2014  . Bipolar I disorder, current or most recent episode manic, severe with mixed features ([place]) 09/20/2014   PCP:  Mechele Claude, FNP Pharmacy:   [facility], [place] [address] Phone: [phone] Fax: [place], [place] - [address] [address] Phone: [phone] Fax: [phone]     Social Determinants of Health (SDOH) Interventions    Readmission Risk Interventions Readmission Risk Prevention Plan 08/24/2020   PCP or Specialist Appt within 5-7 Days Complete  Medication Review (RN CM) Complete  Some recent data might be hidden

## 2020-10-25 NOTE — Progress Notes (Signed)
PROGRESS NOTE    [name]  IPJ:[id number] DOB: Sep 26, 1945 DOA: 10/23/2020 PCP: Mechele Claude, FNP   Brief Narrative: Taken from H&P. [name] is a 75 y.o. female with medical history significant for bipolar 1 with history of moderate mania, hypertension, acquired hypothyroid, dyslipidemia, depression, history of endotracheal intubation, history of respiratory failure, history of acute kidney injury, insulin-dependent diabetes mellitus, on metformin, presents to the emergency department from facility for chief concerns of altered mentation. Recent hospitalization from 09/15/2020 to 10/02/2020: For shortness of breath and acute hypoxemic respiratory failure requiring endotracheal intubation secondary to aspiration pneumonia. She was discharged to rehab.  Concern of underlying dementia. Apparently family does not want to participate in her care and lives in [place]. Talked with 1 family friend listed in her chart-she is confused how to help her.  She does not have any POA.  According to her at baseline patient is mostly oriented to self and able to recognize her whenever she visited her.  She was not aware of any recent changes.  She was found to have mild leukocytosis and UA with pyuria, bacteriuria and positive nitrites.  Started on ceftriaxone with cultures pending.  Subjective: Remains pleasantly confused but very close to her baseline as I recall her from last admission.  Denies any other new complaints  Assessment & Plan:   Active Problems:   Essential hypertension   Hypothyroidism   Severe bipolar I disorder, current or most recent episode depressed ([place])   MDD (major depressive disorder), recurrent episode, severe ([place])   MDD (major depressive disorder)   Lower urinary tract infectious disease   Unable to care for self   Bipolar 1 disorder ([place])   Altered mental status   UTI (urinary tract infection)  Altered mental status most likely secondary to UTI.   Seem to be at her baseline with some underlying dementia.  Urine cultures growing more than 100,000 colonies of E. coli. -Continue with ceftriaxone-we will de-escalate antibiotics once culture sensitivity results are available. -TOC and palliative care input appreciated.  Plan would be to get her back to [place] with outpatient palliative care to follow at discharge  Hypertension.  Blood pressure within goal. -Continue home dose of enalapril  Hypokalemia.  Repleted and resolved  Hyperlipidemia. -Continue home dose of rosuvastatin  Insulin-dependent diabetes mellitus.  She was on long-acting and metformin at home along with linagliptin. -Continue linagliptin and home metformin -Continue with basal and SSI  Anxiety/depression/psychiatric imbalance. -Continue home meds.  Hypothyroidism. -Continue Synthroid  Here recent history of aspiration pneumonia. -Aspiration precautions -Incentive spirometry and flutter valve  Stage II sacral pressure injury, POA -Continue with wound care  Objective: Vitals:   10/25/20 0533 10/25/20 0547 10/25/20 0950 10/25/20 1220  BP: 94/63 (!) 94/58 102/60 108/76  Pulse: 79 78 82 79  Resp: 18  16 18   Temp: 97.6 F (36.4 C)  97.6 F (36.4 C) 98 F (36.7 C)  TempSrc: Oral  Oral Oral  SpO2: 94% 94% 94% 94%  Weight:      Height:        Intake/Output Summary (Last 24 hours) at 10/25/2020 1438 Last data filed at 10/25/2020 1023 Gross per 24 hour  Intake 586.31 ml  Output --  Net 586.31 ml   Filed Weights   10/23/20 1542  Weight: 70.8 kg    Examination:  General exam: Chronically ill-appearing elderly lady, appears calm and comfortable  Respiratory system: Clear to auscultation. Respiratory effort normal. Cardiovascular system: S1 &  S2 heard, RRR.  Gastrointestinal system: Soft, nontender, nondistended, bowel sounds positive. Central nervous system: Alert but oriented to name only,. No focal neurological deficits. Extremities: No  edema, no cyanosis, pulses intact and symmetrical. Psychiatry: Judgement and insight appear impaired.   DVT prophylaxis: Lovenox Code Status: Full Family Communication: None today.  Palliative care tried calling son without any luck on 6/8 Disposition Plan:   .Status is: Inpatient  Remains inpatient appropriate because:Unsafe d/c plan   Dispo: The patient is from: SNF              Anticipated d/c is to: SNF/long-term care at [facility]              Patient currently is not medically stable to d/c.   Difficult to place patient No will have to wait for new passer which can take up to 5 days according to [facility]   All the records are reviewed and case discussed with Care Management/Social Worker. Management plans discussed with the patient, nursing and they are in agreement.  Consultants:   Palliative care  Procedures:  Antimicrobials:  Ceftriaxone  Data Reviewed: I have personally reviewed following labs and imaging studies  CBC: Recent Labs  Lab 10/23/20 1554 10/24/20 0623 10/25/20 0604  WBC 12.4* 12.2* 12.3*  HGB 15.1* 14.4 14.6  HCT 43.6 41.8 42.3  MCV 88.1 89.7 89.4  PLT 237 245 409   Basic Metabolic Panel: Recent Labs  Lab 10/23/20 1554 10/24/20 0623 10/25/20 0604  NA 137 134* 136  K 3.9 3.2* 4.1  CL 102 100 98  CO2 25 25 26   GLUCOSE 178* 128* 106*  BUN 25* 23 19  CREATININE 0.94 0.79 0.87  CALCIUM 9.3 9.1 9.2  MG  --  1.8  --    GFR: Estimated Creatinine Clearance: 52.7 mL/min (by C-G formula based on SCr of 0.87 mg/dL). Liver Function Tests: Recent Labs  Lab 10/23/20 1554  AST 19  ALT 12  ALKPHOS 44  BILITOT 0.6  PROT 6.6  ALBUMIN 3.0*   No results for input(s): LIPASE, AMYLASE in the last 168 hours. No results for input(s): AMMONIA in the last 168 hours. Coagulation Profile: No results for input(s): INR, PROTIME in the last 168 hours. Cardiac Enzymes: No results for input(s): CKTOTAL, CKMB, CKMBINDEX, TROPONINI in the last 168  hours. BNP (last 3 results) No results for input(s): PROBNP in the last 8760 hours. HbA1C: Recent Labs    10/23/20 1554  HGBA1C 7.0*   CBG: Recent Labs  Lab 10/24/20 1257 10/24/20 1629 10/24/20 2205 10/25/20 0759 10/25/20 1118  GLUCAP 127* 163* 85 113* 137*   Lipid Profile: No results for input(s): CHOL, HDL, LDLCALC, TRIG, CHOLHDL, LDLDIRECT in the last 72 hours. Thyroid Function Tests: No results for input(s): TSH, T4TOTAL, FREET4, T3FREE, THYROIDAB in the last 72 hours. Anemia Panel: No results for input(s): VITAMINB12, FOLATE, FERRITIN, TIBC, IRON, RETICCTPCT in the last 72 hours. Sepsis Labs: Recent Labs  Lab 10/23/20 1737  PROCALCITON 0.11    Recent Results (from the past 240 hour(s))  Resp Panel by RT-PCR (Flu A&B, Covid) Nasopharyngeal Swab     Status: None   Collection Time: 10/23/20  3:54 PM   Specimen: Nasopharyngeal Swab; Nasopharyngeal(NP) swabs in vial transport medium  Result Value Ref Range Status   SARS Coronavirus 2 by RT PCR NEGATIVE NEGATIVE Final    Comment: (NOTE) SARS-CoV-2 target nucleic acids are NOT DETECTED.  The SARS-CoV-2 RNA is generally detectable in upper respiratory specimens during the  acute phase of infection. The lowest concentration of SARS-CoV-2 viral copies this assay can detect is 138 copies/mL. A negative result does not preclude SARS-Cov-2 infection and should not be used as the sole basis for treatment or other patient management decisions. A negative result may occur with  improper specimen collection/handling, submission of specimen other than nasopharyngeal swab, presence of viral mutation(s) within the areas targeted by this assay, and inadequate number of viral copies(<138 copies/mL). A negative result must be combined with clinical observations, patient history, and epidemiological information. The expected result is Negative.  Fact Sheet for Patients:  EntrepreneurPulse.com.au  Fact Sheet for  Healthcare Providers:  IncredibleEmployment.be  This test is no t yet approved or cleared by the [place] FDA and  has been authorized for detection and/or diagnosis of SARS-CoV-2 by FDA under an Emergency Use Authorization (EUA). This EUA will remain  in effect (meaning this test can be used) for the duration of the COVID-19 declaration under Section 564(b)(1) of the Act, 21 U.S.C.section 360bbb-3(b)(1), unless the authorization is terminated  or revoked sooner.       Influenza A by PCR NEGATIVE NEGATIVE Final   Influenza B by PCR NEGATIVE NEGATIVE Final    Comment: (NOTE) The Xpert Xpress SARS-CoV-2/FLU/RSV plus assay is intended as an aid in the diagnosis of influenza from Nasopharyngeal swab specimens and should not be used as a sole basis for treatment. Nasal washings and aspirates are unacceptable for Xpert Xpress SARS-CoV-2/FLU/RSV testing.  Fact Sheet for Patients: EntrepreneurPulse.com.au  Fact Sheet for Healthcare Providers: IncredibleEmployment.be  This test is not yet approved or cleared by the [place] FDA and has been authorized for detection and/or diagnosis of SARS-CoV-2 by FDA under an Emergency Use Authorization (EUA). This EUA will remain in effect (meaning this test can be used) for the duration of the COVID-19 declaration under Section 564(b)(1) of the Act, 21 U.S.C. section 360bbb-3(b)(1), unless the authorization is terminated or revoked.  Performed at [facility], [address], [place], [place] [address]   Urine Culture     Status: Abnormal (Preliminary result)   Collection Time: 10/23/20  4:49 PM   Specimen: Urine, Random  Result Value Ref Range Status   Specimen Description   Final    URINE, RANDOM Performed at [facility], [address], [place], [place] [address]    Special Requests   Final    NONE Performed at [facility], [address] [place], [place] [address]    Culture (A)  Final    >=100,000 COLONIES/mL ESCHERICHIA COLI CULTURE REINCUBATED FOR BETTER GROWTH Performed at [facility], [place] [address] [place], [place] [address]    Report Status PENDING  Incomplete     Radiology Studies: CT Head Wo Contrast  Result Date: 10/23/2020 CLINICAL DATA:  Weakness and lethargy. EXAM: CT HEAD WITHOUT CONTRAST TECHNIQUE: Contiguous axial images were obtained from the base of the skull through the vertex without intravenous contrast. COMPARISON:  09/18/2020 FINDINGS: Brain: Moderate low density in the periventricular white matter likely related to small vessel disease. Cerebral atrophy is likely age appropriate. Mild ventriculomegaly is likely secondary. No mass lesion, hemorrhage, acute infarct, intra-axial, or extra-axial fluid collection. Vascular: No hyperdense vessel or unexpected calcification. Skull: No significant soft tissue swelling. Hyperostosis frontalis interna. Sinuses/Orbits: Normal imaged portions of the orbits and globes. Hypoplastic right frontal sinus. Otherwise normal paranasal sinuses and mastoid air cells. Other: None. IMPRESSION: 1. No acute intracranial abnormality. 2. Cerebral atrophy and  small vessel ischemic change. Electronically Signed   By: Abigail Miyamoto M.D.   On: 10/23/2020 16:17   DG Chest Port 1 View  Result Date: 10/23/2020 CLINICAL DATA:  Weakness, lethargy EXAM: PORTABLE CHEST 1 VIEW COMPARISON:  09/20/2020 FINDINGS: Mild bibasilar scarring/atelectasis. No focal consolidation. No pleural effusion or pneumothorax. Heart is normal in size. IMPRESSION: No evidence of acute cardiopulmonary disease. Electronically Signed   By: Julian Hy M.D.   On: 10/23/2020 20:46    Scheduled Meds: . divalproex  1,000 mg Oral QHS  . enalapril  5 mg Oral BID  . enoxaparin (LOVENOX) injection  40 mg Subcutaneous Q24H  . FLUoxetine  60 mg Oral Daily  . insulin aspart  0-15 Units Subcutaneous TID WC  .  insulin aspart  0-5 Units Subcutaneous QHS  . insulin detemir  15 Units Subcutaneous QHS  . levothyroxine  88 mcg Oral QAC breakfast  . linagliptin  5 mg Oral Daily  . lurasidone  20 mg Oral Daily  . metFORMIN  1,000 mg Oral BID WC  . rosuvastatin  40 mg Oral QHS   Continuous Infusions: . cefTRIAXone (ROCEPHIN)  IV 1 g (10/24/20 2229)  . lactated ringers 75 mL/hr at 10/25/20 0547     LOS: 0 days   Time spent: 40 minutes More than 50% of the time was spent in counseling/coordination of care  Max Sane, MD [facility]  If 7PM-7AM, please contact night-coverage [email or web address]  10/25/2020, 2:38 PM   This record has been created using Systems analyst. Errors have been sought and corrected,but may not always be located. Such creation errors do not reflect on the standard of care.

## 2020-10-25 NOTE — Consult Note (Signed)
Consultation Note Date: 10/25/2020   Patient Name: [name]  DOB: Jan 18, 1946  MRN: [id number]  Age / Sex: 75 y.o., female  PCP: Mechele Claude, FNP Referring Physician: Max Sane, MD  Reason for Consultation: Establishing goals of care  HPI/Patient Profile: [name] is a 75 y.o. female with medical history significant for bipolar 1 with history of moderate mania, hypertension, acquired hypothyroid, dyslipidemia, depression, history of endotracheal intubation, history of respiratory failure, history of acute kidney injury, insulin-dependent diabetes mellitus, on metformin, presents to the emergency department from facility for chief concerns of altered mentation.  Clinical Assessment and Goals of Care: Patient is sitting in bed at this time.  No family at bedside.  She tells me that she is divorced with 2 children.  She states her children are named [name] and [name].  She tells me that her children were raised by their father and his wife, and " I do not have a relationship with them."  She is somewhat confused as she tells me she is unsure of where she lives, stating she does not have a home right now.  Upon pointing out her bracelet from a facility, she states she does live in a facility.  She is unable to tell me why she is here in the hospital.  She can tell me her birthday, but not her age.  Upon talking about her health care, patient states "I'm tired of all this".  She then states "I don't want to die".  She then states "I'm just a pain in the ass, I don't know what I want".  Ultimately she states "I would not want to be hurried out of this world, I want to live as long as I can".    Because of her confusion, any goals of care decisions will need to be discussed with her surrogate decision-maker, and I attempted to reach her son [name] unsuccessfully.  It appears patient is  discharging back to her facility.  Would recommend palliative to follow outpatient.     SUMMARY OF RECOMMENDATIONS   Recommend palliative to follow at discharge.       Primary Diagnoses: Present on Admission: . Altered mental status . Bipolar 1 disorder ([place]) . Hypothyroidism . Severe bipolar I disorder, current or most recent episode depressed ([place]) . MDD (major depressive disorder) . Essential hypertension . Unable to care for self . MDD (major depressive disorder), recurrent episode, severe ([place])   I have reviewed the medical record, interviewed the patient and family, and examined the patient. The following aspects are pertinent.  Past Medical History:  Diagnosis Date  . Arthritis   . Breast cancer ([place])   . Cancer [facility]) 2004   right breast ca  . Diabetes mellitus without complication ([place])   . Hypercholesterolemia   . Hypertension   . Hypothyroid   . Seasonal allergies    Social History   Socioeconomic History  . Marital status: Divorced    Spouse name: Not on file  . Number of children:  Not on file  . Years of education: Not on file  . Highest education level: Not on file  Occupational History  . Not on file  Tobacco Use  . Smoking status: Former Smoker    Types: Cigarettes  . Smokeless tobacco: Never Used  Vaping Use  . Vaping Use: Never used  Substance and Sexual Activity  . Alcohol use: No  . Drug use: No  . Sexual activity: Not Currently  Other Topics Concern  . Not on file  Social History Narrative  . Not on file   Social Determinants of Health   Financial Resource Strain: Not on file  Food Insecurity: Not on file  Transportation Needs: Not on file  Physical Activity: Not on file  Stress: Not on file  Social Connections: Not on file   Family History  Problem Relation Age of Onset  . Bladder Cancer Neg Hx   . Kidney cancer Neg Hx    Scheduled Meds: . divalproex  1,000 mg Oral QHS  . enalapril  5 mg Oral BID  . enoxaparin  (LOVENOX) injection  40 mg Subcutaneous Q24H  . FLUoxetine  60 mg Oral Daily  . insulin aspart  0-15 Units Subcutaneous TID WC  . insulin aspart  0-5 Units Subcutaneous QHS  . insulin detemir  15 Units Subcutaneous QHS  . levothyroxine  88 mcg Oral QAC breakfast  . linagliptin  5 mg Oral Daily  . lurasidone  20 mg Oral Daily  . metFORMIN  1,000 mg Oral BID WC  . rosuvastatin  40 mg Oral QHS   Continuous Infusions: . cefTRIAXone (ROCEPHIN)  IV 1 g (10/24/20 2229)  . lactated ringers 75 mL/hr at 10/25/20 0547   PRN Meds:.acetaminophen **OR** acetaminophen, clonazePAM, ondansetron **OR** ondansetron (ZOFRAN) IV Medications Prior to Admission:  Prior to Admission medications   Medication Sig Start Date End Date Taking? Authorizing Provider  clonazePAM (KLONOPIN) 1 MG tablet Take 1 tablet (1 mg total) by mouth 2 (two) times daily. 10/02/20  Yes Max Sane, MD  divalproex (DEPAKOTE ER) 500 MG 24 hr tablet Take 2 tablets (1,000 mg total) by mouth at bedtime. 01/11/20  Yes Clapacs, Madie Reno, MD  enalapril (VASOTEC) 5 MG tablet Take 1 tablet (5 mg total) by mouth 2 (two) times daily. Patient taking differently: Take 10 mg by mouth 2 (two) times daily. 01/11/20  Yes Clapacs, Madie Reno, MD  FLUoxetine (PROZAC) 20 MG capsule Take 60 mg by mouth daily.   Yes [provider]  insulin detemir (LEVEMIR) 100 UNIT/ML injection Inject 0.15 mLs (15 Units total) into the skin daily. 07/31/20  Yes Charlynne Cousins, MD  levothyroxine (SYNTHROID) 88 MCG tablet Take 88 mcg by mouth daily before breakfast.   Yes [provider]  linagliptin (TRADJENTA) 5 MG TABS tablet Take 5 mg by mouth daily.   Yes [provider]  lurasidone (LATUDA) 20 MG TABS tablet Take 20 mg by mouth daily. 08/17/20  Yes [provider]  metFORMIN (GLUCOPHAGE) 500 MG tablet Take 2 tablets (1,000 mg total) by mouth 2 (two) times daily with a meal. 07/31/20  Yes Charlynne Cousins, MD  rosuvastatin (CRESTOR)  40 MG tablet Take 1 tablet (40 mg total) by mouth daily. 01/11/20  Yes Clapacs, Madie Reno, MD  acetaminophen (TYLENOL) 500 MG tablet Take 500 mg by mouth every 6 (six) hours as needed for mild pain.    [provider]  ondansetron (ZOFRAN-ODT) 4 MG disintegrating tablet Take 4 mg by mouth  every 6 (six) hours as needed for nausea. 08/16/20   [provider]   Allergies  Allergen Reactions  . Navane [Thiothixene] Other (See Comments)    Reaction:  Unknown  . Penicillins Rash    Has patient had a PCN reaction causing immediate rash, facial/tongue/throat swelling, SOB or lightheadedness with hypotension: No Has patient had a PCN reaction causing severe rash involving mucus membranes or skin necrosis: No Has patient had a PCN reaction that required hospitalization: No Has patient had a PCN reaction occurring within the last 10 years: No If all of the above answers are "NO", then may proceed with Cephalosporin use.  Has patient had a PCN reaction causing immediate rash, facial/tongue/throat swelling, SOB or lightheadedness with hypotension: No Has patient had a PCN reaction causing severe rash involving mucus membranes or skin necrosis: No Has patient had a PCN reaction that required hospitalization: No Has patient had a PCN reaction occurring within the last 10 years: No If all of the above answers are "NO", then may proceed with Cephalosporin use. Has patient had a PCN reaction causing immediate rash, facial/tongue/throat swelling, SOB or lightheadedness with hypotension: No Has patient had a PCN reaction causing severe rash involving mucus membranes or skin necrosis: No Has patient had a PCN reaction that required hospitalization: No Has patient had a PCN reaction occurring within the last 10 years: No If all of the above answers are "NO", then may proceed with Cephalosporin use.   Review of Systems  All other systems reviewed and are negative.   Physical Exam Pulmonary:      Effort: Pulmonary effort is normal.  Neurological:     Mental Status: She is alert.     Vital Signs: BP 108/76 (BP Location: Right Arm)   Pulse 79   Temp 98 F (36.7 C) (Oral)   Resp 18   Ht 5\' 3"  (1.6 m)   Wt 70.8 kg   SpO2 94%   BMI 27.65 kg/m  Pain Scale: 0-10   Pain Score: 0-No pain   SpO2: SpO2: 94 % O2 Device:SpO2: 94 % O2 Flow Rate: .   IO: Intake/output summary:   Intake/Output Summary (Last 24 hours) at 10/25/2020 1325 Last data filed at 10/25/2020 1023 Gross per 24 hour  Intake 586.31 ml  Output --  Net 586.31 ml    LBM: Last BM Date: 10/24/20 Baseline Weight: Weight: 70.8 kg Most recent weight: Weight: 70.8 kg     Time In: 9:50 Time Out: 10:20 Time Total: 30 min Greater than 50%  of this time was spent counseling and coordinating care related to the above assessment and plan.  Signed by: Asencion Gowda, NP   Please contact Palliative Medicine Team phone at [phone] for questions and concerns.  For individual provider: See Shea Evans

## 2020-10-25 NOTE — NC FL2 (Signed)
[place] LEVEL OF CARE SCREENING TOOL     IDENTIFICATION  Patient Name: [name] Birthdate: July 09, 1945 Sex: female Admission Date (Current Location): 10/23/2020  [facility] [place] and [place] Number:  Engineering geologist and Address:  [facility], [address] [place], [place] [address]      Provider Number: [id number]  Attending Physician Name and Address:  Max Sane, MD  Relative Name and Phone Number:       Current Level of Care: Hospital Recommended Level of Care: [place] Prior Approval Number:    Date Approved/Denied:   PASRR Number: Manual review  Discharge Plan: SNF    Current Diagnoses: Patient Active Problem List   Diagnosis Date Noted  . Altered mental status 10/23/2020  . SOB (shortness of breath)   . Endotracheally intubated   . Acute respiratory failure with hypoxia ([place]) 09/16/2020  . Acute hypoxemic respiratory failure ([place]) 09/16/2020  . Hypomagnesemia   . Aspiration pneumonia ([place]) 09/15/2020  . Generalized weakness 08/22/2020  . Lower urinary tract infectious disease 08/22/2020  . Unable to care for self 08/22/2020  . Bipolar 1 disorder ([place]) 08/22/2020  . Weakness 08/22/2020  . AKI (acute kidney injury) ([place]) 08/22/2020  . Bipolar disorder current episode depressed ([place]) 01/07/2020  . MDD (major depressive disorder) 08/10/2019  . MDD (major depressive disorder), recurrent episode, severe ([place]) 06/24/2019  . Chronic kidney disease, stage 3 unspecified ([place]) 05/11/2019  . Pain due to onychomycosis of toenails of both feet 11/23/2018  . Bipolar 1 disorder, manic, moderate ([place]) 07/21/2018  . Bipolar 1 disorder, mixed, severe ([place]) 07/21/2018  . Bipolar 1 disorder with moderate mania ([place]) 07/20/2018  . Severe bipolar I disorder, current or most recent episode depressed ([place]) 07/07/2017  . PTSD (post-traumatic stress disorder) 07/07/2017  . Adjustment disorder with mixed disturbance  of emotions and conduct 10/11/2016  . Type 2 diabetes mellitus with hyperlipidemia ([place]) 09/23/2014  . Essential hypertension 09/21/2014  . Hypothyroidism 09/21/2014  . Dyslipidemia 09/21/2014  . Bipolar I disorder, current or most recent episode manic, severe with mixed features ([place]) 09/20/2014    Orientation RESPIRATION BLADDER Height & Weight      (Disoriented x 4)  Normal Incontinent,External catheter Weight: 156 lb 1.4 oz (70.8 kg) Height:  5\' 3"  (160 cm)  BEHAVIORAL SYMPTOMS/MOOD NEUROLOGICAL BOWEL NUTRITION STATUS   (None)  (None) Incontinent Diet (Heart healthy/carb modified. High aspiration risk.)  AMBULATORY STATUS COMMUNICATION OF NEEDS Skin   Total Care Verbally Bruising,PU Stage and Appropriate Care   PU Stage 2 Dressing: Daily (Sacrum: Foam)                   Personal Care Assistance Level of Assistance  Total care       Total Care Assistance: Maximum assistance   Functional Limitations Info  Sight,Speech,Hearing Sight Info: Adequate Hearing Info: Adequate Speech Info: Adequate    SPECIAL CARE FACTORS FREQUENCY  PT (By licensed PT),OT (By licensed OT)     PT Frequency: 5 x week OT Frequency: 5 x week            Contractures Contractures Info: Not present    Additional Factors Info  Code Status,Allergies,Psychotropic Code Status Info: Full code Allergies Info: Navane ([email or web address]), Penicillins Psychotropic Info: Bipolar I, Adjustment Disorder, PTSD         Current Medications (10/25/2020):  This is the current hospital active medication list Current Facility-Administered Medications  Medication Dose Route Frequency Provider Last Rate Last  Admin  . acetaminophen (TYLENOL) tablet 1,000 mg  1,000 mg Oral Q6H PRN Cox, Amy N, DO       Or  . acetaminophen (TYLENOL) suppository 650 mg  650 mg Rectal Q6H PRN Cox, Amy N, DO      . cefTRIAXone (ROCEPHIN) 1 g in sodium chloride 0.9 % 100 mL IVPB  1 g Intravenous Q24H Cox, Amy N, DO 200 mL/hr at  10/24/20 2229 1 g at 10/24/20 2229  . clonazePAM (KLONOPIN) tablet 1 mg  1 mg Oral BID PRN Cox, Amy N, DO      . divalproex (DEPAKOTE ER) 24 hr tablet 1,000 mg  1,000 mg Oral QHS Cox, Amy N, DO   1,000 mg at 10/24/20 2226  . enalapril (VASOTEC) tablet 5 mg  5 mg Oral BID Cox, Amy N, DO   5 mg at 10/25/20 0951  . enoxaparin (LOVENOX) injection 40 mg  40 mg Subcutaneous Q24H Cox, Amy N, DO   40 mg at 10/24/20 2226  . FLUoxetine (PROZAC) capsule 60 mg  60 mg Oral Daily Cox, Amy N, DO   60 mg at 10/25/20 0950  . insulin aspart (novoLOG) injection 0-15 Units  0-15 Units Subcutaneous TID WC Cox, Amy N, DO   3 Units at 10/24/20 1653  . insulin aspart (novoLOG) injection 0-5 Units  0-5 Units Subcutaneous QHS Cox, Amy N, DO   3 Units at 10/23/20 2223  . insulin detemir (LEVEMIR) injection 15 Units  15 Units Subcutaneous QHS Cox, Amy N, DO   15 Units at 10/23/20 2223  . lactated ringers infusion   Intravenous Continuous Lorella Nimrod, MD 75 mL/hr at 10/25/20 0547 New Bag at 10/25/20 0547  . levothyroxine (SYNTHROID) tablet 88 mcg  88 mcg Oral QAC breakfast Cox, Amy N, DO   88 mcg at 10/25/20 0549  . linagliptin (TRADJENTA) tablet 5 mg  5 mg Oral Daily Cox, Amy N, DO   5 mg at 10/25/20 0951  . lurasidone (LATUDA) tablet 20 mg  20 mg Oral Daily Cox, Amy N, DO   20 mg at 10/24/20 1147  . metFORMIN (GLUCOPHAGE) tablet 1,000 mg  1,000 mg Oral BID WC Cox, Amy N, DO   1,000 mg at 10/25/20 0949  . ondansetron (ZOFRAN) tablet 4 mg  4 mg Oral Q6H PRN Cox, Amy N, DO       Or  . ondansetron (ZOFRAN) injection 4 mg  4 mg Intravenous Q6H PRN Cox, Amy N, DO      . rosuvastatin (CRESTOR) tablet 40 mg  40 mg Oral QHS Cox, Amy N, DO   40 mg at 10/24/20 2226     Discharge Medications: Please see discharge summary for a list of discharge medications.  Relevant Imaging Results:  Relevant Lab Results:   Additional Information SS#: [id number]  Candie Chroman, LCSW

## 2020-10-25 NOTE — Evaluation (Signed)
Occupational Therapy Evaluation Patient Details Name: [name] MRN: [id number] DOB: 04/04/1946 Today's Date: 10/25/2020    History of Present Illness [name] is a 68yoF who comes [facility] on 10/23/20 from facility c AMS. PMH: HTN, DM, hypothyroidism, bipolar 1 disorder,hypoTSH, IDDM.   Clinical Impression   [name] was seen for OT evaluation this date. Prior to hospital admission, pt was at [facility]. Pt presents to acute OT demonstrating impaired ADL performance and functional mobility 2/2 decreased activity tolerance, functional strength/ROM/balance deficits. Pt currently requires TOTAL A for ADL t/f. MOD A + single UE support grooming sitting EOB. TOTAL A toileting and LBD at bed level. Pt would benefit from skilled OT to address noted impairments and functional limitations (see below for any additional details) in order to maximize safety and independence while minimizing falls risk and caregiver burden. Upon hospital discharge, recommend STR to maximize pt safety and return to PLOF.     Follow Up Recommendations  SNF    Equipment Recommendations  3 in 1 bedside commode    Recommendations for Other Services       Precautions / Restrictions Precautions Precautions: Fall Restrictions Weight Bearing Restrictions: No      Mobility Bed Mobility Overal bed mobility: Needs Assistance Bed Mobility: Rolling;Supine to Sit;Sit to Supine Rolling: Total assist;+2 for physical assistance   Supine to sit: Total assist;+2 for physical assistance Sit to supine: Total assist;+2 for physical assistance   General bed mobility comments: sits c MIN A, attempts to sit unsupported ~10 sec before gradual continual post lean    Transfers Overall transfer level: Needs assistance   Transfers: Lateral/Scoot Transfers          Lateral/Scoot Transfers: Max assist      Balance Overall balance assessment: Needs assistance Sitting-balance support: Feet supported;Single extremity  supported Sitting balance-Leahy Scale: Poor                                     ADL either performed or assessed with clinical judgement   ADL Overall ADL's : Needs assistance/impaired                                       General ADL Comments: TOTAL A for ADL t/f. MOD A + single UE support grooming sitting EOB. TOTAL A toileting and LBD at bed level                  Pertinent Vitals/Pain Pain Assessment: Faces Faces Pain Scale: Hurts little more Pain Location: legs with attempted stretching out/reposition Pain Descriptors / Indicators: Sore Pain Intervention(s): Limited activity within patient's tolerance;Repositioned     Hand Dominance     Extremity/Trunk Assessment Upper Extremity Assessment Upper Extremity Assessment: Generalized weakness (moves against gravity, grossly 3/5)   Lower Extremity Assessment Lower Extremity Assessment:  (rigid and flexed hips and knees, difficult to reposition)       Communication     Cognition Arousal/Alertness: Awake/alert Behavior During Therapy: Flat affect Overall Cognitive Status: No family/caregiver present to determine baseline cognitive functioning                                     General Comments       Exercises Exercises: Other exercises  Other Exercises Other Exercises: Pt educated re: OT role, DME recs, d/c recs, falls prevention Other Exercises: LBD< UBD< toileting, grooming, sup<>sit, rolling, sitting/standing balance/tolerance   Shoulder Instructions      Home Living Family/patient expects to be discharged to:: Skilled nursing facility                                        Prior Functioning/Environment Level of Independence: Needs assistance  Gait / Transfers Assistance Needed: MOD I piror to recent hopsitalization, receiving PT at rehab currently              OT Problem List: Decreased strength;Decreased range of motion;Decreased  activity tolerance;Impaired balance (sitting and/or standing);Decreased safety awareness;Decreased knowledge of use of DME or AE;Decreased knowledge of precautions;Pain      OT Treatment/Interventions: Self-care/ADL training;DME and/or AE instruction;Therapeutic activities;Therapeutic exercise;Patient/family education;Balance training    OT Goals(Current goals can be found in the care plan section) Acute Rehab OT Goals Patient Stated Goal: to get stronger OT Goal Formulation: With patient Time For Goal Achievement: 11/08/20 Potential to Achieve Goals: Good ADL Goals Pt Will Perform Grooming: sitting;with mod assist Pt Will Perform Upper Body Bathing: with min assist;bed level Pt Will Transfer to Toilet: with max assist (rolling bed level)  OT Frequency: Min 1X/week    AM-PAC OT "6 Clicks" Daily Activity     Outcome Measure Help from another person eating meals?: A Lot Help from another person taking care of personal grooming?: A Lot Help from another person toileting, which includes using toliet, bedpan, or urinal?: A Lot Help from another person bathing (including washing, rinsing, drying)?: A Lot Help from another person to put on and taking off regular upper body clothing?: A Lot Help from another person to put on and taking off regular lower body clothing?: A Lot 6 Click Score: 12   End of Session Nurse Communication: Mobility status  Activity Tolerance: Patient tolerated treatment well Patient left: in bed;with call bell/phone within reach;with bed alarm set  OT Visit Diagnosis: Unsteadiness on feet (R26.81);Muscle weakness (generalized) (M62.81)                Time: 1610-9604 OT Time Calculation (min): 20 min Charges:  OT General Charges $OT Visit: 1 Visit OT Evaluation $OT Eval Low Complexity: 1 Low OT Treatments $Self Care/Home Management : 8-22 mins   Dessie Coma, M.S. OTR/L  10/25/20, 4:02 PM  ascom [phone]

## 2020-10-26 LAB — CBC
HCT: 37.8 % (ref 36.0–46.0)
Hemoglobin: 13.4 g/dL (ref 12.0–15.0)
MCH: 30.6 pg (ref 26.0–34.0)
MCHC: 35.4 g/dL (ref 30.0–36.0)
MCV: 86.3 fL (ref 80.0–100.0)
Platelets: 233 10*3/uL (ref 150–400)
RBC: 4.38 MIL/uL (ref 3.87–5.11)
RDW: 13.2 % (ref 11.5–15.5)
WBC: 11.9 10*3/uL — ABNORMAL HIGH (ref 4.0–10.5)
nRBC: 0 % (ref 0.0–0.2)

## 2020-10-26 LAB — BASIC METABOLIC PANEL
Anion gap: 10 (ref 5–15)
BUN: 12 mg/dL (ref 8–23)
CO2: 23 mmol/L (ref 22–32)
Calcium: 8.8 mg/dL — ABNORMAL LOW (ref 8.9–10.3)
Chloride: 102 mmol/L (ref 98–111)
Creatinine, Ser: 0.78 mg/dL (ref 0.44–1.00)
GFR, Estimated: >60 mL/min (ref >60)
Glucose, Bld: 79 mg/dL (ref 70–99)
Potassium: 4 mmol/L (ref 3.5–5.1)
Sodium: 135 mmol/L (ref 135–145)

## 2020-10-26 LAB — GLUCOSE, CAPILLARY
Glucose-Capillary: 170 mg/dL — ABNORMAL HIGH (ref 70–99)
Glucose-Capillary: 59 mg/dL — ABNORMAL LOW (ref 70–99)
Glucose-Capillary: 67 mg/dL — ABNORMAL LOW (ref 70–99)
Glucose-Capillary: 79 mg/dL (ref 70–99)
Glucose-Capillary: 108 mg/dL — ABNORMAL HIGH (ref 70–99)
Glucose-Capillary: 94 mg/dL (ref 70–99)

## 2020-10-26 LAB — URINE CULTURE: Culture: >=100000 — AB

## 2020-10-26 MED ORDER — DEXTROSE 50 % IV SOLN
INTRAVENOUS | Status: AC
  Administered 2020-10-26: 12.5 g via INTRAVENOUS
  Filled 2020-10-26: qty 50

## 2020-10-26 MED ORDER — DEXTROSE 50 % IV SOLN: 12.5000 g | INTRAVENOUS | Status: AC

## 2020-10-26 MED ORDER — INSULIN DETEMIR 100 UNIT/ML ~~LOC~~ SOLN
10.0000 [IU] | Freq: Every day | SUBCUTANEOUS | Status: DC
  Filled 2020-10-26 (×2): qty 0.1

## 2020-10-26 MED ORDER — CEPHALEXIN 250 MG PO CAPS: 250.0000 mg | ORAL_CAPSULE | Freq: Two times a day (BID) | ORAL | Status: DC

## 2020-10-26 MED ORDER — CEPHALEXIN 500 MG PO CAPS
500.0000 mg | ORAL_CAPSULE | Freq: Three times a day (TID) | ORAL | Status: DC
  Administered 2020-10-26 – 2020-10-27 (×4): 500 mg via ORAL
  Filled 2020-10-26 (×4): qty 1

## 2020-10-26 NOTE — Progress Notes (Signed)
Inpatient Diabetes Program Recommendations  AACE/ADA: New Consensus Statement on Inpatient Glycemic Control (2015)  Target Ranges:  Prepandial:   less than 140 mg/dL      Peak postprandial:   less than 180 mg/dL (1-2 hours)      Critically ill patients:  140 - 180 mg/dL   Lab Results  Component Value Date   GLUCAP 170 (H) 10/26/2020   HGBA1C 7.0 (H) 10/23/2020    Review of Glycemic Control Results for [name], [name] (MRN [id number]) as of 10/26/2020 08:52  Ref. Range 10/25/2020 16:29 10/25/2020 20:58 10/26/2020 07:45 10/26/2020 07:57 10/26/2020 08:26  Glucose-Capillary Latest Ref Range: 70 - 99 mg/dL 81 113 (H) 59 (L) 67 (L) 170 (H)     Inpatient Diabetes Program Recommendations:    Decrease Levemir to 10 units QHS.  Will continue to follow while inpatient.  Thank you, Reche Dixon, RN, BSN Diabetes Coordinator Inpatient Diabetes Program [phone] (team pager from 8a-5p)

## 2020-10-26 NOTE — Progress Notes (Signed)
PROGRESS NOTE    [name]  JOA:[id number] DOB: [birth year] DOA: 10/23/2020 PCP: Mechele [place], FNP   Brief Narrative: Taken from H&P. [name] is a 75 y.o. female with medical history significant for bipolar 1 with history of moderate mania, hypertension, acquired hypothyroid, dyslipidemia, depression, history of endotracheal intubation, history of respiratory failure, history of acute kidney injury, insulin-dependent diabetes mellitus, on metformin, presents to the emergency department from facility for chief concerns of altered mentation. Recent hospitalization from 09/15/2020 to 10/02/2020: For shortness of breath and acute hypoxemic respiratory failure requiring endotracheal intubation secondary to aspiration pneumonia. She was discharged to rehab.  Concern of underlying dementia. Apparently family does not want to participate in her care and lives in [place]. Talked with 1 family friend listed in her chart-she is confused how to help her.  She does not have any POA.  According to her at baseline patient is mostly oriented to self and able to recognize her whenever she visited her.  She was not aware of any recent changes.  She was found to have mild leukocytosis and UA with pyuria, bacteriuria and positive nitrites.  Started on ceftriaxone with cultures pending.  Subjective: Blood sugar dropped to 59.  Not much symptomatic.  Received some orange juice and sugar came up within normal limit.  No other new issues.  Eating breakfast  Assessment & Plan:   Active Problems:   Essential hypertension   Hypothyroidism   Severe bipolar I disorder, current or most recent episode depressed ([place])   MDD (major depressive disorder), recurrent episode, severe ([place])   MDD (major depressive disorder)   Lower urinary tract infectious disease   Unable to care for self   Bipolar 1 disorder ([place])   Altered mental status   UTI (urinary tract infection)  Altered mental status  most likely secondary to UTI.  Seem to be at her baseline with some underlying dementia.  Urine cultures growing more than 100,000 colonies of E. coli. -We will switch her to oral Keflex from IV Rocephin on 6/9  Hypertension.  Blood pressure within goal. -Continue home dose of enalapril  Hypokalemia.  Repleted and resolved  Hyperlipidemia. -Continue home dose of rosuvastatin  Insulin-dependent diabetes mellitus.  She was on long-acting and metformin at home along with linagliptin. -Continue linagliptin and home metformin.  Her blood sugar was low at 59 this morning.  Reduce insulin Levemir to 10 units subcu nightly from 15 -Continue with SSI  Anxiety/depression/psychiatric imbalance. -Continue home meds.  Hypothyroidism. -Continue Synthroid  Here recent history of aspiration pneumonia. -Aspiration precautions -Incentive spirometry and flutter valve  Stage II sacral pressure injury, POA -Continue with wound care  Objective: Vitals:   10/23/20 1542 10/26/20 0433 10/26/20 0743 10/26/20 1132  BP:  110/67 91/78 101/66  Pulse:  76 77 75  Resp:  16 14 14   Temp:  98.2 F (36.8 C) 97.6 F (36.4 C) 97.7 F (36.5 C)  TempSrc:  Oral Oral Oral  SpO2:  99% 95% 95%  Weight: 70.8 kg     Height: 5\' 3"  (1.6 m)       Intake/Output Summary (Last 24 hours) at 10/26/2020 1327 Last data filed at 10/26/2020 1015 Gross per 24 hour  Intake 1858.93 ml  Output --  Net 1858.93 ml   Filed Weights   10/23/20 1542  Weight: 70.8 kg    Examination:  General exam: Chronically ill-appearing elderly lady, appears calm and comfortable  Respiratory system: Clear to auscultation. Respiratory effort normal.  Cardiovascular system: S1 & S2 heard, RRR.  Gastrointestinal system: Soft, nontender, nondistended, bowel sounds positive. Central nervous system: Alert but oriented to name only,. No focal neurological deficits. Extremities: No edema, no cyanosis, pulses intact and symmetrical. Psychiatry:  Judgement and insight appear impaired.   DVT prophylaxis: Lovenox Code Status: Full Family Communication: None today Disposition Plan: -TOC working on Immunologist.  [facility] with outpatient palliative care to follow at discharge  .Status is: Inpatient  Remains inpatient appropriate because:Unsafe d/c plan   Dispo: The patient is from: SNF              Anticipated d/c is to: SNF/long-term care at [facility] healthcare              Patient currently is not medically stable to d/c.   Difficult to place patient No will have to wait for new passar which can take few days according to [facility]   All the records are reviewed and case discussed with Care Management/Social Worker. Management plans discussed with the patient, nursing and they are in agreement.  Consultants:  Palliative care  Procedures:  Antimicrobials:  Ceftriaxone  Data Reviewed: I have personally reviewed following labs and imaging studies  CBC: Recent Labs  Lab 10/23/20 1554 10/24/20 0623 10/25/20 0604 10/26/20 0454  WBC 12.4* 12.2* 12.3* 11.9*  HGB 15.1* 14.4 14.6 13.4  HCT 43.6 41.8 42.3 37.8  MCV 88.1 89.7 89.4 86.3  PLT 237 245 244 299   Basic Metabolic Panel: Recent Labs  Lab 10/23/20 1554 10/24/20 0623 10/25/20 0604 10/26/20 0454  NA 137 134* 136 135  K 3.9 3.2* 4.1 4.0  CL 102 100 98 102  CO2 25 25 26 23   GLUCOSE 178* 128* 106* 79  BUN 25* 23 19 12   CREATININE 0.94 0.79 0.87 0.78  CALCIUM 9.3 9.1 9.2 8.8*  MG  --  1.8  --   --    GFR: Estimated Creatinine Clearance: 57.4 mL/min (by C-G formula based on SCr of 0.78 mg/dL). Liver Function Tests: Recent Labs  Lab 10/23/20 1554  AST 19  ALT 12  ALKPHOS 44  BILITOT 0.6  PROT 6.6  ALBUMIN 3.0*   No results for input(s): LIPASE, AMYLASE in the last 168 hours. No results for input(s): AMMONIA in the last 168 hours. Coagulation Profile: No results for input(s): INR, PROTIME in the last 168 hours. Cardiac  Enzymes: No results for input(s): CKTOTAL, CKMB, CKMBINDEX, TROPONINI in the last 168 hours. BNP (last 3 results) No results for input(s): PROBNP in the last 8760 hours. HbA1C: Recent Labs    10/23/20 1554  HGBA1C 7.0*   CBG: Recent Labs  Lab 10/25/20 2058 10/26/20 0745 10/26/20 0757 10/26/20 0826 10/26/20 1133  GLUCAP 113* 59* 67* 170* 79   Lipid Profile: No results for input(s): CHOL, HDL, LDLCALC, TRIG, CHOLHDL, LDLDIRECT in the last 72 hours. Thyroid Function Tests: No results for input(s): TSH, T4TOTAL, FREET4, T3FREE, THYROIDAB in the last 72 hours. Anemia Panel: No results for input(s): VITAMINB12, FOLATE, FERRITIN, TIBC, IRON, RETICCTPCT in the last 72 hours. Sepsis Labs: Recent Labs  Lab 10/23/20 1737  PROCALCITON 0.11    Recent Results (from the past 240 hour(s))  Resp Panel by RT-PCR (Flu A&B, Covid) Nasopharyngeal Swab     Status: None   Collection Time: 10/23/20  3:54 PM   Specimen: Nasopharyngeal Swab; Nasopharyngeal(NP) swabs in vial transport medium  Result Value Ref Range Status   SARS Coronavirus 2 by RT PCR NEGATIVE  NEGATIVE Final    Comment: (NOTE) SARS-CoV-2 target nucleic acids are NOT DETECTED.  The SARS-CoV-2 RNA is generally detectable in upper respiratory specimens during the acute phase of infection. The lowest concentration of SARS-CoV-2 viral copies this assay can detect is 138 copies/mL. A negative result does not preclude SARS-Cov-2 infection and should not be used as the sole basis for treatment or other patient management decisions. A negative result may occur with  improper specimen collection/handling, submission of specimen other than nasopharyngeal swab, presence of viral mutation(s) within the areas targeted by this assay, and inadequate number of viral copies(<138 copies/mL). A negative result must be combined with clinical observations, patient history, and epidemiological information. The expected result is  Negative.  Fact Sheet for Patients:  EntrepreneurPulse.com.au  Fact Sheet for Healthcare Providers:  IncredibleEmployment.be  This test is no t yet approved or cleared by the [place] FDA and  has been authorized for detection and/or diagnosis of SARS-CoV-2 by FDA under an Emergency Use Authorization (EUA). This EUA will remain  in effect (meaning this test can be used) for the duration of the COVID-19 declaration under Section 564(b)(1) of the Act, 21 U.S.C.section 360bbb-3(b)(1), unless the authorization is terminated  or revoked sooner.       Influenza A by PCR NEGATIVE NEGATIVE Final   Influenza B by PCR NEGATIVE NEGATIVE Final    Comment: (NOTE) The Xpert Xpress SARS-CoV-2/FLU/RSV plus assay is intended as an aid in the diagnosis of influenza from Nasopharyngeal swab specimens and should not be used as a sole basis for treatment. Nasal washings and aspirates are unacceptable for Xpert Xpress SARS-CoV-2/FLU/RSV testing.  Fact Sheet for Patients: EntrepreneurPulse.com.au  Fact Sheet for Healthcare Providers: IncredibleEmployment.be  This test is not yet approved or cleared by the [place] FDA and has been authorized for detection and/or diagnosis of SARS-CoV-2 by FDA under an Emergency Use Authorization (EUA). This EUA will remain in effect (meaning this test can be used) for the duration of the COVID-19 declaration under Section 564(b)(1) of the Act, 21 U.S.C. section 360bbb-3(b)(1), unless the authorization is terminated or revoked.  Performed at [facility], [address] [place], [place] [address]   Urine Culture     Status: Abnormal   Collection Time: 10/23/20  4:49 PM   Specimen: Urine, Random  Result Value Ref Range Status   Specimen Description   Final    URINE, RANDOM Performed at [facility], [address] [place], [place] [address]    Special  Requests   Final    NONE Performed at [facility], [address] [place], [place] [address]    Culture (A)  Final    >=100,000 COLONIES/mL ESCHERICHIA COLI 60,000 COLONIES/mL AEROCOCCUS URINAE Standardized susceptibility testing for this organism is not available. Performed at [facility], [place] [address] [place],  [address]    Report Status 10/26/2020 FINAL  Final   Organism ID, Bacteria ESCHERICHIA COLI (A)  Final      Susceptibility   Escherichia coli - MIC*    AMPICILLIN 8 SENSITIVE Sensitive     CEFAZOLIN <=4 SENSITIVE Sensitive     CEFEPIME <=0.12 SENSITIVE Sensitive     CEFTRIAXONE <=0.25 SENSITIVE Sensitive     CIPROFLOXACIN <=0.25 SENSITIVE Sensitive     GENTAMICIN <=1 SENSITIVE Sensitive     IMIPENEM <=0.25 SENSITIVE Sensitive     NITROFURANTOIN <=16 SENSITIVE Sensitive     TRIMETH/SULFA <=20 SENSITIVE Sensitive     AMPICILLIN/SULBACTAM <=2  SENSITIVE Sensitive     PIP/TAZO <=4 SENSITIVE Sensitive     * >=100,000 COLONIES/mL ESCHERICHIA COLI     Radiology Studies: No results found.   Scheduled Meds:  divalproex  1,000 mg Oral QHS   enalapril  5 mg Oral BID   enoxaparin (LOVENOX) injection  40 mg Subcutaneous Q24H   FLUoxetine  60 mg Oral Daily   insulin aspart  0-15 Units Subcutaneous TID WC   insulin aspart  0-5 Units Subcutaneous QHS   insulin detemir  10 Units Subcutaneous QHS   levothyroxine  88 mcg Oral QAC breakfast   linagliptin  5 mg Oral Daily   lurasidone  20 mg Oral Daily   metFORMIN  1,000 mg Oral BID WC   rosuvastatin  40 mg Oral QHS   Continuous Infusions:  cefTRIAXone (ROCEPHIN)  IV 1 g (10/25/20 2030)   lactated ringers 75 mL/hr at 10/26/20 0617     LOS: 1 day   Time spent: 40 minutes More than 50% of the time was spent in counseling/coordination of care  Max Sane, MD [facility]  If 7PM-7AM, please contact night-coverage [email or web address]  10/26/2020, 1:27 PM   This record has been created using  Systems analyst. Errors have been sought and corrected,but may not always be located. Such creation errors do not reflect on the standard of care.

## 2020-10-26 NOTE — Progress Notes (Signed)
   10/26/20 1330  Clinical Encounter Type  Visited With Patient  Visit Type Initial;Spiritual support;Social support  Referral From Chaplain  Consult/Referral To [place] offered intentional presence and reflective listening. PT said that she wishes she were home, and that she gets lonely in the hospital. Chaplain offered emotional support, and notified PT that chaplain services are always available.

## 2020-10-26 NOTE — Progress Notes (Signed)
Physical Therapy Treatment Patient Details Name: [name] MRN: [id number] DOB: 1946-03-21 Today's Date: 10/26/2020    History of Present Illness [name] is a 73yoF who comes to [facility] on 10/23/20 from facility c AMS. PMH: HTN, DM, hypothyroidism, bipolar 1 disorder,hypoTSH, IDDM.    PT Comments    Patient continues to require significant assistance with bed mobility with rigid extremities with activity. Patient required Mod A to maintain sitting balance with right and posterior lean. Large bowel movement occurred with sitting upright on edge of bed and patient needs total assistance for peri-care. Patient will need SNF placement at discharge. PT will continue to follow to maximize independence and decrease caregiver burden.     Follow Up Recommendations  SNF     Equipment Recommendations  None recommended by PT    Recommendations for Other Services       Precautions / Restrictions Precautions Precautions: Fall Restrictions Weight Bearing Restrictions: No    Mobility  Bed Mobility Overal bed mobility: Needs Assistance Bed Mobility: Rolling;Supine to Sit;Sit to Supine Rolling: Max assist   Supine to sit: Total assist Sit to supine: Total assist   General bed mobility comments: patient does initiate movement of extremities with bed mobility, however continues to require total assistance with bed mobility. cues for trunk and BLE support. patient required maximal assistance for rolling to left and right for peri-hygiene due to large bowel movement that occured with sitting upright    Transfers                 General transfer comment: not attempted as patient required significant assistance for bed mobility and sitting balance  Ambulation/Gait                 Stairs             Wheelchair Mobility    Modified Rankin (Stroke Patients Only)       Balance Overall balance assessment: Needs assistance Sitting-balance support: Bilateral  upper extremity supported Sitting balance-Leahy Scale: Poor Sitting balance - Comments: patient needs Mod A for sitting balance with right and posterior lean Postural control: Right lateral lean;Posterior lean                                  Cognition Arousal/Alertness: Awake/alert Behavior During Therapy: Flat affect Overall Cognitive Status: No family/caregiver present to determine baseline cognitive functioning                                 General Comments: patient able to follow single step commands with extra time      Exercises      General Comments        Pertinent Vitals/Pain Pain Assessment: Faces Faces Pain Scale: Hurts little more Pain Location: left knee with movement Pain Descriptors / Indicators: Sore Pain Intervention(s): Limited activity within patient's tolerance    Home Living                      Prior Function            PT Goals (current goals can now be found in the care plan section) Acute Rehab PT Goals Patient Stated Goal: to get stronger PT Goal Formulation: With patient Time For Goal Achievement: 11/07/20 Potential to Achieve Goals: Fair Progress towards PT goals: Progressing toward goals  Frequency    Min 2X/week      PT Plan Current plan remains appropriate    Co-evaluation              AM-PAC PT "6 Clicks" Mobility   Outcome Measure  Help needed turning from your back to your side while in a flat bed without using bedrails?: Total Help needed moving from lying on your back to sitting on the side of a flat bed without using bedrails?: Total Help needed moving to and from a bed to a chair (including a wheelchair)?: Total Help needed standing up from a chair using your arms (e.g., wheelchair or bedside chair)?: Total Help needed to walk in hospital room?: Total Help needed climbing 3-5 steps with a railing? : Total 6 Click Score: 6    End of Session   Activity Tolerance:  Patient limited by fatigue Patient left: in bed;with call bell/phone within reach;with bed alarm set Nurse Communication:  (nursing student in the room. white board up to date with mobility status) PT Visit Diagnosis: Muscle weakness (generalized) (M62.81);Other symptoms and signs involving the nervous system (R29.898)     Time: 2446-9507 PT Time Calculation (min) (ACUTE ONLY): 31 min  Charges:  $Therapeutic Activity: 23-37 mins                     Minna Merritts, PT, MPT    Percell Locus 10/26/2020, 12:46 PM

## 2020-10-26 NOTE — TOC Progression Note (Addendum)
Transition of Care [facility]) - Progression Note    Patient Details  Name: Montia Haslip MRN: [id number] Date of Birth: 05/20/1946  Transition of Care [facility]) CM/SW [place], LCSW Phone Number: 10/26/2020, 10:07 AM  Clinical Narrative:   Per RN assessment, patient AOx3 today. CSW met with patient who confirmed she is willing to return to [facility] to resume rehab. Also made her aware that they are able to accept her for long-term care if needed later on. Told her CSW would be back around 2:00 for PASARR assessment. Patient agreeable.  3:17 pm: Started PASARR assessment but then patient had to go to the bathroom. PASARR case worker had a family emergency so rescheduled for tomorrow.  Expected Discharge Plan: Skilled Nursing Facility Barriers to Discharge: [place] (PASRR), Insurance Authorization  Expected Discharge Plan and Services Expected Discharge Plan: [place]     Post Acute Care Choice: Resumption of Svcs/PTA Provider Living arrangements for the past 2 months: [place]                                       Social Determinants of Health (SDOH) Interventions    Readmission Risk Interventions Readmission Risk Prevention Plan 08/24/2020  PCP or Specialist Appt within 5-7 Days Complete  Medication Review (RN CM) Complete  Some recent data might be hidden

## 2020-10-27 LAB — RESP PANEL BY RT-PCR (FLU A&B, COVID) ARPGX2
Influenza A by PCR: NEGATIVE
Influenza B by PCR: NEGATIVE
SARS Coronavirus 2 by RT PCR: NEGATIVE

## 2020-10-27 LAB — GLUCOSE, CAPILLARY
Glucose-Capillary: 84 mg/dL (ref 70–99)
Glucose-Capillary: 103 mg/dL — ABNORMAL HIGH (ref 70–99)

## 2020-10-27 MED ORDER — CLONAZEPAM 1 MG PO TABS: 1.0000 mg | ORAL_TABLET | Freq: Two times a day (BID) | ORAL | 0 refills | Status: DC

## 2020-10-27 MED ORDER — METFORMIN HCL 500 MG PO TABS: 500.0000 mg | ORAL_TABLET | Freq: Two times a day (BID) | ORAL | Status: DC

## 2020-10-27 NOTE — TOC Progression Note (Addendum)
Transition of Care [facility]) - Progression Note    Patient Details  Name: [name] MRN: [id number] Date of Birth: 17-Mar-1946  Transition of Care [facility]) CM/SW [place], LCSW Phone Number: 10/27/2020, 10:02 AM  Clinical Narrative:   [place] with completing assessment on patient. B reported the completed assessment will be in by this afternoon. Also reached out to [employer] to inquire status of insurance auth.  10:45- Per Lavella Lemons, still waiting on insurance auth. She will call CSW when she gets [place]. Patient can come today if Josem Kaufmann is obtained.  1:10- Per Lavella Lemons, she can take patient today. Needs COVID test. Asked MD and RN.  2:05- Waiting for COVID results.    Expected Discharge Plan: Skilled Nursing Facility Barriers to Discharge: [place] (PASRR), Insurance Authorization  Expected Discharge Plan and Services Expected Discharge Plan: [place]     Post Acute Care Choice: Resumption of Svcs/PTA Provider Living arrangements for the past 2 months: [place]                                       Social Determinants of Health (SDOH) Interventions    Readmission Risk Interventions Readmission Risk Prevention Plan 08/24/2020  PCP or Specialist Appt within 5-7 Days Complete  Medication Review (RN CM) Complete  Some recent data might be hidden

## 2020-10-27 NOTE — Plan of Care (Signed)
Continuing with plan of care. 

## 2020-10-27 NOTE — Discharge Summary (Signed)
[place] [facility] [place] NAME: [name]    MR#:  [id number]  DATE OF BIRTH:  01-01-1946  DATE OF ADMISSION:  10/23/2020   ADMITTING PHYSICIAN: Max Sane, MD  DATE OF DISCHARGE: 10/27/2020  PRIMARY CARE PHYSICIAN: Mechele Claude, FNP   ADMISSION DIAGNOSIS:  Lower urinary tract infectious disease [N39.0] Altered mental status [R41.82] Weakness [R53.1] Altered mental status, unspecified altered mental status type [R41.82] UTI (urinary tract infection) [N39.0] DISCHARGE DIAGNOSIS:  Active Problems:   Essential hypertension   Hypothyroidism   Severe bipolar I disorder, current or most recent episode depressed (HCC)   MDD (major depressive disorder), recurrent episode, severe (HCC)   MDD (major depressive disorder)   Lower urinary tract infectious disease   Unable to care for self   Bipolar 1 disorder ([place])   Altered mental status   UTI (urinary tract infection)  SECONDARY DIAGNOSIS:   Past Medical History:  Diagnosis Date   Arthritis    Breast cancer ([place])    Cancer ([place]) 2004   right breast ca   Diabetes mellitus without complication (HCC)    Hypercholesterolemia    Hypertension    Hypothyroid    Seasonal allergies    HOSPITAL COURSE:  75 year old female with a known history of bipolar 1 disorder, hypertension, acquired hypothyroidism, dyslipidemia, depression, history of endotracheal intubation, history of respiratory failure, IDDM is admitted for altered mental status  Acute metabolic encephalopathy due to UTI UTI treated with antibiotic.  Mental status back to her baseline.  Hypotension with a history essential hypertension -stopping enalapril.  Blood pressure in good control without any need of antihypertensives Hypokalemia: Repleted and resolved Hyperlipidemia: Statin Insulin-dependent diabetes mellitus: Blood sugar has been running low at times.  Insulin Levemir and cut back on the dose of metformin at discharge continue  remaining medications as this for now and adjust as an outpatient if need Anxiety/depression/bipolar disorder: Continue home meds Hypothyroidism: Continue Synthroid Stage II sacral pressure injury present on admission: No signs of infection.  Continue dry dressing.  DISCHARGE CONDITIONS:  Fair CONSULTS OBTAINED:   DRUG ALLERGIES:   Allergies  Allergen Reactions   Navane [Thiothixene] Other (See Comments)    Reaction:  Unknown   Penicillins Rash    Has patient had a PCN reaction causing immediate rash, facial/tongue/throat swelling, SOB or lightheadedness with hypotension: No Has patient had a PCN reaction causing severe rash involving mucus membranes or skin necrosis: No Has patient had a PCN reaction that required hospitalization: No Has patient had a PCN reaction occurring within the last 10 years: No If all of the above answers are "NO", then may proceed with Cephalosporin use.  Has patient had a PCN reaction causing immediate rash, facial/tongue/throat swelling, SOB or lightheadedness with hypotension: No Has patient had a PCN reaction causing severe rash involving mucus membranes or skin necrosis: No Has patient had a PCN reaction that required hospitalization: No Has patient had a PCN reaction occurring within the last 10 years: No If all of the above answers are "NO", then may proceed with Cephalosporin use. Has patient had a PCN reaction causing immediate rash, facial/tongue/throat swelling, SOB or lightheadedness with hypotension: No Has patient had a PCN reaction causing severe rash involving mucus membranes or skin necrosis: No Has patient had a PCN reaction that required hospitalization: No Has patient had a PCN reaction occurring within the last 10 years: No If all of the above answers are "NO", then  may proceed with Cephalosporin use.   DISCHARGE MEDICATIONS:   Allergies as of 10/27/2020       Reactions   Navane [thiothixene] Other (See Comments)   Reaction:   Unknown   Penicillins Rash   Has patient had a PCN reaction causing immediate rash, facial/tongue/throat swelling, SOB or lightheadedness with hypotension: No Has patient had a PCN reaction causing severe rash involving mucus membranes or skin necrosis: No Has patient had a PCN reaction that required hospitalization: No Has patient had a PCN reaction occurring within the last 10 years: No If all of the above answers are "NO", then may proceed with Cephalosporin use. Has patient had a PCN reaction causing immediate rash, facial/tongue/throat swelling, SOB or lightheadedness with hypotension: No Has patient had a PCN reaction causing severe rash involving mucus membranes or skin necrosis: No Has patient had a PCN reaction that required hospitalization: No Has patient had a PCN reaction occurring within the last 10 years: No If all of the above answers are "NO", then may proceed with Cephalosporin use. Has patient had a PCN reaction causing immediate rash, facial/tongue/throat swelling, SOB or lightheadedness with hypotension: No Has patient had a PCN reaction causing severe rash involving mucus membranes or skin necrosis: No Has patient had a PCN reaction that required hospitalization: No Has patient had a PCN reaction occurring within the last 10 years: No If all of the above answers are "NO", then may proceed with Cephalosporin use.        Medication List     STOP taking these medications    enalapril 5 MG tablet Commonly known as: VASOTEC   insulin detemir 100 UNIT/ML injection Commonly known as: LEVEMIR       TAKE these medications    acetaminophen 500 MG tablet Commonly known as: TYLENOL Take 500 mg by mouth every 6 (six) hours as needed for mild pain.   clonazePAM 1 MG tablet Commonly known as: KLONOPIN Take 1 tablet (1 mg total) by mouth 2 (two) times daily.   divalproex 500 MG 24 hr tablet Commonly known as: DEPAKOTE ER Take 2 tablets (1,000 mg total) by mouth at  bedtime.   FLUoxetine 20 MG capsule Commonly known as: PROZAC Take 60 mg by mouth daily.   levothyroxine 88 MCG tablet Commonly known as: SYNTHROID Take 88 mcg by mouth daily before breakfast.   linagliptin 5 MG Tabs tablet Commonly known as: TRADJENTA Take 5 mg by mouth daily.   lurasidone 20 MG Tabs tablet Commonly known as: LATUDA Take 20 mg by mouth daily.   metFORMIN 500 MG tablet Commonly known as: GLUCOPHAGE Take 1 tablet (500 mg total) by mouth 2 (two) times daily with a meal. What changed: how much to take   ondansetron 4 MG disintegrating tablet Commonly known as: ZOFRAN-ODT Take 4 mg by mouth every 6 (six) hours as needed for nausea.   rosuvastatin 40 MG tablet Commonly known as: CRESTOR Take 1 tablet (40 mg total) by mouth daily.       DISCHARGE INSTRUCTIONS:   DIET:  Diabetic diet DISCHARGE CONDITION:  Fair ACTIVITY:  Activity as tolerated OXYGEN:  Home Oxygen: No.  Oxygen Delivery: room air DISCHARGE LOCATION:  nursing home   If you experience worsening of your admission symptoms, develop shortness of breath, life threatening emergency, suicidal or homicidal thoughts you must seek medical attention immediately by calling 911 or calling your MD immediately  if symptoms less severe.  You Must read complete instructions/literature along with all the  possible adverse reactions/side effects for all the Medicines you take and that have been prescribed to you. Take any new Medicines after you have completely understood and accpet all the possible adverse reactions/side effects.   Please note  You were cared for by a hospitalist during your hospital stay. If you have any questions about your discharge medications or the care you received while you were in the hospital after you are discharged, you can call the unit and asked to speak with the hospitalist on call if the hospitalist that took care of you is not available. Once you are discharged, your primary  care physician will handle any further medical issues. Please note that NO REFILLS for any discharge medications will be authorized once you are discharged, as it is imperative that you return to your primary care physician (or establish a relationship with a primary care physician if you do not have one) for your aftercare needs so that they can reassess your need for medications and monitor your lab values.    On the day of Discharge:  VITAL SIGNS:  Blood pressure (!) 99/58, pulse 69, temperature (!) 97.5 F (36.4 C), temperature source Oral, resp. rate 20, height 5\' 3"  (1.6 m), weight 70.8 kg, SpO2 97 %. PHYSICAL EXAMINATION:  GENERAL:  75 y.o.-year-old patient lying in the bed with no acute distress.  EYES: Pupils equal, round, reactive to light and accommodation. No scleral icterus. Extraocular muscles intact.  HEENT: Head atraumatic, normocephalic. Oropharynx and nasopharynx clear.  NECK:  Supple, no jugular venous distention. No thyroid enlargement, no tenderness.  LUNGS: Normal breath sounds bilaterally, no wheezing, rales,rhonchi or crepitation. No use of accessory muscles of respiration.  CARDIOVASCULAR: S1, S2 normal. No murmurs, rubs, or gallops.  ABDOMEN: Soft, non-tender, non-distended. Bowel sounds present. No organomegaly or mass.  EXTREMITIES: No pedal edema, cyanosis, or clubbing.  NEUROLOGIC: Cranial nerves II through XII are intact. Muscle strength 5/5 in all extremities. Sensation intact. Gait not checked.  PSYCHIATRIC: The patient is alert and oriented x 3.  SKIN: No obvious rash, lesion, or ulcer.  DATA REVIEW:   CBC Recent Labs  Lab 10/26/20 0454  WBC 11.9*  HGB 13.4  HCT 37.8  PLT 233    Chemistries  Recent Labs  Lab 10/23/20 1554 10/24/20 0623 10/25/20 0604 10/26/20 0454  NA 137 134*   < > 135  K 3.9 3.2*   < > 4.0  CL 102 100   < > 102  CO2 25 25   < > 23  GLUCOSE 178* 128*   < > 79  BUN 25* 23   < > 12  CREATININE 0.94 0.79   < > 0.78  CALCIUM  9.3 9.1   < > 8.8*  MG  --  1.8  --   --   AST 19  --   --   --   ALT 12  --   --   --   ALKPHOS 44  --   --   --   BILITOT 0.6  --   --   --    < > = values in this interval not displayed.     Outpatient follow-up  Follow-up Information     Mechele Claude, FNP. Schedule an appointment as soon as possible for a visit in 1 week(s).   Specialty: Family Medicine Why: [facility] Discharge F/UP Contact information: [place] [address] [phone]  30 Day Unplanned Readmission Risk Score    Flowsheet Row ED to Hosp-Admission (Current) from 10/23/2020 in [place]  30 Day Unplanned Readmission Risk Score (%) 25.84 Filed at 10/27/2020 1200       This score is the patient's risk of an unplanned readmission within 30 days of being discharged (0 -100%). The score is based on dignosis, age, lab data, medications, orders, and past utilization.   Low:  0-14.9   Medium: 15-21.9   High: 22-29.9   Extreme: 30 and above           Management plans discussed with the patient, family and they are in agreement.  CODE STATUS: Full Code   TOTAL TIME TAKING CARE OF THIS PATIENT: 45 minutes.    Max Sane M.D on 10/27/2020 at 2:09 PM  [facility]   CC: Primary care physician; Mechele Claude, FNP   Note: This dictation was prepared with Dragon dictation along with smaller phrase technology. Any transcriptional errors that result from this process are unintentional.

## 2020-10-27 NOTE — TOC Transition Note (Addendum)
Transition of Care [facility]) - CM/SW Discharge Note   Patient Details  Name: [name] MRN: [id number] Date of Birth: [birth year]  Transition of Care [facility]) CM/SW Contact:  Magnus Ivan, LCSW Phone Number: 10/27/2020, 2:22 PM   Clinical Narrative:   Patient to discharge to [facility] today, Room 93A. Confirmed with Lavella Lemons at [facility]. CSW updated MD, RN, patient/family. Asked RN to call report and swab for rapid COVID. Medical Necessity Form and Face Sheet placed in Discharge Packet by patient chart.   EMS will be called when COVID results are back.   4:05- RN is now ready for transport. [facility] called for transport to [facility]. Patient is next on their list.  Final next level of care: Skilled Nursing Facility Barriers to Discharge: Barriers Resolved   Patient Goals and CMS Choice Patient states their goals for this hospitalization and ongoing recovery are:: SNF CMS Medicare.gov Compare Post Acute Care list provided to:: Patient Choice offered to / list presented to : Patient  Discharge Placement              Patient [facility] bed at: [facility] Patient to be transferred to facility by: EMS   Patient and family notified of of transfer: 10/27/20  Discharge Plan and Services     Post Acute Care Choice: Resumption of Svcs/PTA Provider                               Social Determinants of Health (SDOH) Interventions     Readmission Risk Interventions Readmission Risk Prevention Plan 08/24/2020  PCP or Specialist Appt within 5-7 Days Complete  Medication Review (RN CM) Complete  Some recent data might be hidden

## 2020-10-27 NOTE — Plan of Care (Signed)
Report given to receiving nurse at [facility], patient is in stable condition, transferred via EMS to facility.

## 2020-10-30 ENCOUNTER — Encounter: Payer: Self-pay | Admitting: Nurse Practitioner

## 2020-10-30 ENCOUNTER — Other Ambulatory Visit: Payer: Self-pay

## 2020-10-30 ENCOUNTER — Non-Acute Institutional Stay: Payer: Medicare Other | Admitting: Nurse Practitioner

## 2020-10-30 VITALS — BP 118/63 | HR 67 | Temp 98.0°F | Resp 18 | Wt 160.0 lb

## 2020-10-30 DIAGNOSIS — R531 Weakness: Secondary | ICD-10-CM

## 2020-10-30 DIAGNOSIS — Z515 Encounter for palliative care: Secondary | ICD-10-CM

## 2020-10-30 NOTE — Progress Notes (Signed)
[facility] Consult Note Telephone: [phone]  Fax: [phone]    Date of encounter: 10/30/20 PATIENT NAME: [name] [address] [place] [address]   [phone] (home)  DOB: 24-Nov-1945 MRN: [id number] PRIMARY CARE PROVIDER:   Dr Lucianne Lei [facility] [name] [place], [place],  [address] [place] [address] [phone]  RESPONSIBLE PARTY:    Contact Information     Name Relation Home Work [place] ,[place] Other   [phone]   Dian Situ   [phone]      I met face to face with patient in facility. Palliative Care was asked to follow this patient by consultation request of  Dr Nona Dell to address advance care planning and complex medical decision making. This is a follow up visit. ASSESSMENT AND PLAN / RECOMMENDATIONS:  Symptom Management/Plan: 1. ACP Full code per Ms. Grogan wishes, will continue discussions   2. Generalized weakness with debility due to deconditioning secondary to multiple hospitalizations, recent uti, decrease oral intake,  encourage mobility, continue to work with therapy, fall precautions; requested psychiatry for motivation, competency; also encourage nutrition, oral hydration   3. Goals of Care: Goals include to maximize quality of life and symptom management. Our advance care planning conversation included a discussion about:    The value and importance of advance care planning  Exploration of personal, cultural or spiritual beliefs that might influence medical decisions  Exploration of goals of care in the event of a sudden injury or illness  Identification and preparation of a healthcare agent  Review and updating or creation of an advance directive document.   4. Palliative care encounter; Palliative care encounter; Palliative medicine team will continue to support patient, patient's family, and medical team. Visit consisted of  counseling and education dealing with the complex and emotionally intense issues of symptom management and palliative care in the setting of serious and potentially life-threatening illness   Follow up Palliative Care Visit: Palliative care will continue to follow for complex medical decision making, advance care planning, and clarification of goals. Return 2 weeks or prn.  I spent 40 minutes providing this consultation. More than 50% of the time in this consultation was spent in counseling and care coordination.  PPS: 30%  HOSPICE ELIGIBILITY/DIAGNOSIS: TBD  Chief Complaint: Follow up Palliative consult for complex medical decision making  HISTORY OF PRESENT ILLNESS:  [name] is a 75 y.o. year old female  with multiple medical problems including hypertension, diabetes, hypothyroidism, breast cancer, bipolar disorder. [name] was hospitalized 07/24/2020 to 08/02/2020 for weakness, falling at home, found to be in DKA requiring insulin drip transitioned to sq with metformin, detemir, glipizide, tradjenta. Dscharged to STR then home. Re-hospitalized 08/21/2020 to 08/24/2020 for being too weak to get up after being home for 1 day. Urine culture grew E. Coli, electrolyte abnormalities including hypokalemia and hypomagnesemia along with AKI which was resolved with repletion of fluid and electrolytes. Hospitalized 09/15/2020 to 10/02/2020 for acute hypoxemic respiratory failure with aspiration pna after choking on a hot dog. [name] under went s/p intubation & bronchoscopy. Extubated on 09/17/20, resolved. She was d/c to Short Term Rehab at [facility]. She returned hospitalized 10/23/2020 to 610/2022 for altered mental status with acute metabolic encephalopathy secondary to uti with stage ll pressure injury. Since [name] has been d/c back to [facility]. [name] does requires assistance for mobility, transfers, bathing, dressing,  toileting incontinence of  bowel/bladder due to weakness, deconditioning. Staff endorses [name] does feed herself with decreased appetite. Staff endorses it has been challenging with mobility. [name] continues to sleep most of the day. At present, [name] is lying in bed, appears comfortable, decompensated, chronically ill. No visitors present. I visited and observed [name]. [name] does make eye contact, answers questions pertaining to pain and shortness of breath which she replies no. [name] has to be prompted with questions. [name] endorses she does not remember a lot of what has happened over the last week or even today. She has not been able to recall what she has eaten or if she had any visitors. [name] was cooperative with assessment. Limited verbal discussion with cognitive impairment. Emotional support provided. I discussed with [facility] SW [name] is her RP. Will recommend psych consult for competency, updated [name] NP. I updated nursing staff.    History obtained from review of EMR, discussion with facility staff and [name].  I reviewed available labs, medications, imaging, studies and related documents from the EMR.  Records reviewed and summarized above.   ROS Full 14 system review of systems performed and negative with exception of: as per HPI.   Physical Exam: Constitutional: NAD General: decompensated, obese pleasantly confused female EYES: lids intact ENMT: oral mucous membranes moist CV: S1S2, RRR Pulmonary: clear, decrease bases, no increased work of breathing Abdomen: normo-active BS + 4 quadrants, soft and non tender, MSK: bed-bound  Skin: warm and dry, no rashes or wounds on visible skin Neuro:  + generalized weakness,  mild cognitive impairment Psych: flat affect, A and oriented to self  Questions and concerns were addressed. The staff was encouraged to call with questions and/or concerns.  Provided general support and encouragement, no other unmet needs  identified   Thank you for the opportunity to participate in the care of [name].  The palliative care team will continue to follow. Please call our office at [phone] if we can be of additional assistance.   This chart was dictated using voice recognition software.  Despite best efforts to proofread,  errors can occur which can change the documentation meaning.   Macaela Presas Ihor Gully, NP , MSN, [facility]

## 2020-11-09 ENCOUNTER — Emergency Department: Payer: Medicare Other

## 2020-11-09 ENCOUNTER — Inpatient Hospital Stay
Admission: EM | Admit: 2020-11-09 | Discharge: 2020-12-18 | DRG: 870 | Disposition: E | Payer: Medicare Other | Source: Skilled Nursing Facility | Attending: Internal Medicine | Admitting: Internal Medicine

## 2020-11-09 ENCOUNTER — Other Ambulatory Visit: Payer: Self-pay

## 2020-11-09 ENCOUNTER — Inpatient Hospital Stay: Payer: Medicare Other

## 2020-11-09 DIAGNOSIS — R627 Adult failure to thrive: Secondary | ICD-10-CM | POA: Diagnosis not present

## 2020-11-09 DIAGNOSIS — J690 Pneumonitis due to inhalation of food and vomit: Secondary | ICD-10-CM | POA: Diagnosis present

## 2020-11-09 DIAGNOSIS — I272 Pulmonary hypertension, unspecified: Secondary | ICD-10-CM

## 2020-11-09 DIAGNOSIS — J9602 Acute respiratory failure with hypercapnia: Secondary | ICD-10-CM | POA: Diagnosis present

## 2020-11-09 DIAGNOSIS — J9601 Acute respiratory failure with hypoxia: Secondary | ICD-10-CM | POA: Diagnosis not present

## 2020-11-09 DIAGNOSIS — G9341 Metabolic encephalopathy: Secondary | ICD-10-CM | POA: Diagnosis not present

## 2020-11-09 DIAGNOSIS — A419 Sepsis, unspecified organism: Principal | ICD-10-CM

## 2020-11-09 DIAGNOSIS — G928 Other toxic encephalopathy: Secondary | ICD-10-CM | POA: Diagnosis present

## 2020-11-09 DIAGNOSIS — E876 Hypokalemia: Secondary | ICD-10-CM | POA: Diagnosis not present

## 2020-11-09 DIAGNOSIS — Z66 Do not resuscitate: Secondary | ICD-10-CM | POA: Diagnosis not present

## 2020-11-09 DIAGNOSIS — Z79890 Hormone replacement therapy (postmenopausal): Secondary | ICD-10-CM

## 2020-11-09 DIAGNOSIS — L89152 Pressure ulcer of sacral region, stage 2: Secondary | ICD-10-CM | POA: Diagnosis present

## 2020-11-09 DIAGNOSIS — E7800 Pure hypercholesterolemia, unspecified: Secondary | ICD-10-CM | POA: Diagnosis present

## 2020-11-09 DIAGNOSIS — E875 Hyperkalemia: Secondary | ICD-10-CM | POA: Diagnosis not present

## 2020-11-09 DIAGNOSIS — E1165 Type 2 diabetes mellitus with hyperglycemia: Secondary | ICD-10-CM | POA: Diagnosis present

## 2020-11-09 DIAGNOSIS — E43 Unspecified severe protein-calorie malnutrition: Secondary | ICD-10-CM | POA: Diagnosis not present

## 2020-11-09 DIAGNOSIS — L89626 Pressure-induced deep tissue damage of left heel: Secondary | ICD-10-CM | POA: Diagnosis present

## 2020-11-09 DIAGNOSIS — Z880 Allergy status to penicillin: Secondary | ICD-10-CM

## 2020-11-09 DIAGNOSIS — Z4659 Encounter for fitting and adjustment of other gastrointestinal appliance and device: Secondary | ICD-10-CM

## 2020-11-09 DIAGNOSIS — R6521 Severe sepsis with septic shock: Secondary | ICD-10-CM | POA: Diagnosis present

## 2020-11-09 DIAGNOSIS — Z20822 Contact with and (suspected) exposure to covid-19: Secondary | ICD-10-CM | POA: Diagnosis present

## 2020-11-09 DIAGNOSIS — Z6828 Body mass index (BMI) 28.0-28.9, adult: Secondary | ICD-10-CM

## 2020-11-09 DIAGNOSIS — Z79899 Other long term (current) drug therapy: Secondary | ICD-10-CM

## 2020-11-09 DIAGNOSIS — I5021 Acute systolic (congestive) heart failure: Secondary | ICD-10-CM | POA: Diagnosis not present

## 2020-11-09 DIAGNOSIS — Z7189 Other specified counseling: Secondary | ICD-10-CM | POA: Diagnosis not present

## 2020-11-09 DIAGNOSIS — E1169 Type 2 diabetes mellitus with other specified complication: Secondary | ICD-10-CM | POA: Diagnosis present

## 2020-11-09 DIAGNOSIS — N390 Urinary tract infection, site not specified: Secondary | ICD-10-CM | POA: Diagnosis present

## 2020-11-09 DIAGNOSIS — Z515 Encounter for palliative care: Secondary | ICD-10-CM | POA: Diagnosis not present

## 2020-11-09 DIAGNOSIS — I4891 Unspecified atrial fibrillation: Secondary | ICD-10-CM | POA: Diagnosis present

## 2020-11-09 DIAGNOSIS — J189 Pneumonia, unspecified organism: Secondary | ICD-10-CM | POA: Diagnosis present

## 2020-11-09 DIAGNOSIS — F3113 Bipolar disorder, current episode manic without psychotic features, severe: Secondary | ICD-10-CM | POA: Diagnosis present

## 2020-11-09 DIAGNOSIS — N179 Acute kidney failure, unspecified: Secondary | ICD-10-CM | POA: Diagnosis present

## 2020-11-09 DIAGNOSIS — Z9911 Dependence on respirator [ventilator] status: Secondary | ICD-10-CM | POA: Diagnosis not present

## 2020-11-09 DIAGNOSIS — F3163 Bipolar disorder, current episode mixed, severe, without psychotic features: Secondary | ICD-10-CM | POA: Diagnosis present

## 2020-11-09 DIAGNOSIS — F419 Anxiety disorder, unspecified: Secondary | ICD-10-CM | POA: Diagnosis present

## 2020-11-09 DIAGNOSIS — Z7984 Long term (current) use of oral hypoglycemic drugs: Secondary | ICD-10-CM

## 2020-11-09 DIAGNOSIS — E785 Hyperlipidemia, unspecified: Secondary | ICD-10-CM | POA: Diagnosis present

## 2020-11-09 DIAGNOSIS — E039 Hypothyroidism, unspecified: Secondary | ICD-10-CM | POA: Diagnosis present

## 2020-11-09 DIAGNOSIS — Z87891 Personal history of nicotine dependence: Secondary | ICD-10-CM

## 2020-11-09 DIAGNOSIS — Z853 Personal history of malignant neoplasm of breast: Secondary | ICD-10-CM

## 2020-11-09 DIAGNOSIS — E872 Acidosis: Secondary | ICD-10-CM | POA: Diagnosis not present

## 2020-11-09 DIAGNOSIS — Z638 Other specified problems related to primary support group: Secondary | ICD-10-CM

## 2020-11-09 LAB — URINALYSIS, COMPLETE (UACMP) WITH MICROSCOPIC
Bilirubin Urine: NEGATIVE
Glucose, UA: NEGATIVE mg/dL
Ketones, ur: 20 mg/dL — AB
Nitrite: POSITIVE — AB
Protein, ur: 100 mg/dL — AB
Specific Gravity, Urine: >1.03 — ABNORMAL HIGH (ref 1.005–1.030)
pH: 5.5 (ref 5.0–8.0)

## 2020-11-09 LAB — ACETAMINOPHEN LEVEL: Acetaminophen (Tylenol), Serum: <10 ug/mL — ABNORMAL LOW (ref 10–30)

## 2020-11-09 LAB — LACTIC ACID, PLASMA
Lactic Acid, Venous: 2.2 mmol/L (ref 0.5–1.9)
Lactic Acid, Venous: 2.8 mmol/L (ref 0.5–1.9)
Lactic Acid, Venous: 5.4 mmol/L (ref 0.5–1.9)

## 2020-11-09 LAB — CBC WITH DIFFERENTIAL/PLATELET
Abs Immature Granulocytes: 0.11 10*3/uL — ABNORMAL HIGH (ref 0.00–0.07)
Basophils Absolute: 0.1 10*3/uL (ref 0.0–0.1)
Basophils Relative: 0 %
Eosinophils Absolute: 0 10*3/uL (ref 0.0–0.5)
Eosinophils Relative: 0 %
HCT: 46.1 % — ABNORMAL HIGH (ref 36.0–46.0)
Hemoglobin: 14.8 g/dL (ref 12.0–15.0)
Immature Granulocytes: 1 %
Lymphocytes Relative: 14 %
Lymphs Abs: 2 10*3/uL (ref 0.7–4.0)
MCH: 30.2 pg (ref 26.0–34.0)
MCHC: 32.1 g/dL (ref 30.0–36.0)
MCV: 94.1 fL (ref 80.0–100.0)
Monocytes Absolute: 1.7 10*3/uL — ABNORMAL HIGH (ref 0.1–1.0)
Monocytes Relative: 12 %
Neutro Abs: 10.5 10*3/uL — ABNORMAL HIGH (ref 1.7–7.7)
Neutrophils Relative %: 73 %
Platelets: 381 10*3/uL (ref 150–400)
RBC: 4.9 MIL/uL (ref 3.87–5.11)
RDW: 14.8 % (ref 11.5–15.5)
WBC: 14.4 10*3/uL — ABNORMAL HIGH (ref 4.0–10.5)
nRBC: 0 % (ref 0.0–0.2)

## 2020-11-09 LAB — COMPREHENSIVE METABOLIC PANEL
ALT: 12 U/L (ref 0–44)
AST: 20 U/L (ref 15–41)
Albumin: 2.4 g/dL — ABNORMAL LOW (ref 3.5–5.0)
Alkaline Phosphatase: 51 U/L (ref 38–126)
Anion gap: 13 (ref 5–15)
BUN: 50 mg/dL — ABNORMAL HIGH (ref 8–23)
CO2: 27 mmol/L (ref 22–32)
Calcium: 9.4 mg/dL (ref 8.9–10.3)
Chloride: 105 mmol/L (ref 98–111)
Creatinine, Ser: 1.03 mg/dL — ABNORMAL HIGH (ref 0.44–1.00)
GFR, Estimated: 57 mL/min — ABNORMAL LOW (ref >60)
Glucose, Bld: 172 mg/dL — ABNORMAL HIGH (ref 70–99)
Potassium: 4.2 mmol/L (ref 3.5–5.1)
Sodium: 145 mmol/L (ref 135–145)
Total Bilirubin: 0.7 mg/dL (ref 0.3–1.2)
Total Protein: 7.2 g/dL (ref 6.5–8.1)

## 2020-11-09 LAB — BLOOD GAS, ARTERIAL
Acid-Base Excess: 1.1 mmol/L (ref 0.0–2.0)
Bicarbonate: 23.4 mmol/L (ref 20.0–28.0)
FIO2: 1
MECHVT: 480 mL
O2 Saturation: 98.1 %
PEEP: 8 cmH2O
Patient temperature: 37
RATE: 18 resp/min
pCO2 arterial: 30 mmHg — ABNORMAL LOW (ref 32.0–48.0)
pH, Arterial: 7.5 — ABNORMAL HIGH (ref 7.350–7.450)
pO2, Arterial: 97 mmHg (ref 83.0–108.0)

## 2020-11-09 LAB — URINE DRUG SCREEN, QUALITATIVE (ARMC ONLY)
Amphetamines, Ur Screen: NOT DETECTED
Barbiturates, Ur Screen: NOT DETECTED
Benzodiazepine, Ur Scrn: POSITIVE — AB
Cannabinoid 50 Ng, Ur ~~LOC~~: NOT DETECTED
Cocaine Metabolite,Ur ~~LOC~~: NOT DETECTED
MDMA (Ecstasy)Ur Screen: NOT DETECTED
Methadone Scn, Ur: NOT DETECTED
Opiate, Ur Screen: NOT DETECTED
Phencyclidine (PCP) Ur S: NOT DETECTED
Tricyclic, Ur Screen: NOT DETECTED

## 2020-11-09 LAB — MRSA NEXT GEN BY PCR, NASAL: MRSA by PCR Next Gen: NOT DETECTED

## 2020-11-09 LAB — GLUCOSE, CAPILLARY: Glucose-Capillary: 160 mg/dL — ABNORMAL HIGH (ref 70–99)

## 2020-11-09 LAB — CBG MONITORING, ED: Glucose-Capillary: 179 mg/dL — ABNORMAL HIGH (ref 70–99)

## 2020-11-09 LAB — T4, FREE: Free T4: 0.87 ng/dL (ref 0.61–1.12)

## 2020-11-09 LAB — RESP PANEL BY RT-PCR (FLU A&B, COVID) ARPGX2
Influenza A by PCR: NEGATIVE
Influenza B by PCR: NEGATIVE
SARS Coronavirus 2 by RT PCR: NEGATIVE

## 2020-11-09 LAB — SALICYLATE LEVEL: Salicylate Lvl: <7 mg/dL — ABNORMAL LOW (ref 7.0–30.0)

## 2020-11-09 LAB — PROCALCITONIN: Procalcitonin: 0.71 ng/mL

## 2020-11-09 LAB — TROPONIN I (HIGH SENSITIVITY)
Troponin I (High Sensitivity): 13 ng/L (ref <18)
Troponin I (High Sensitivity): 14 ng/L (ref <18)

## 2020-11-09 LAB — TSH: TSH: 10.179 u[IU]/mL — ABNORMAL HIGH (ref 0.350–4.500)

## 2020-11-09 LAB — BRAIN NATRIURETIC PEPTIDE: B Natriuretic Peptide: 68.7 pg/mL (ref 0.0–100.0)

## 2020-11-09 IMAGING — CT CT HEAD W/O CM
3 series · 15 of 47 positions shown, 18 images · non-contrast
Comparison: [DATE]

CLINICAL DATA: Altered mental status, decreased responsiveness

EXAM:
CT HEAD WITHOUT CONTRAST
TECHNIQUE: Contiguous axial images were obtained from the base of the skull
through the vertex without intravenous contrast.

[Series 4: head wo · axial · 0.42mm/px · z∈[-148,-23]mm · 9 of 31 slices shown, 12 images]
[im 3/31  brain]
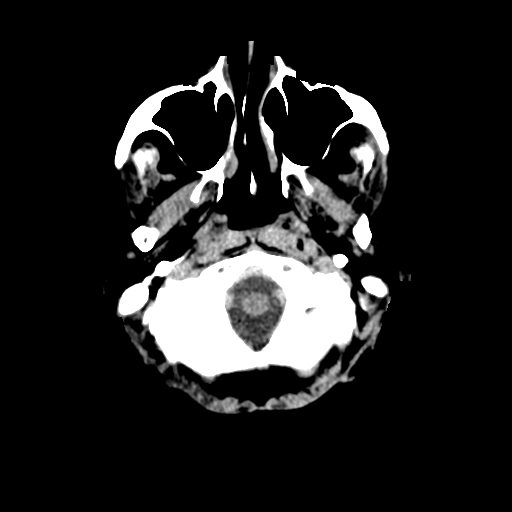
[im 3/31  bone]
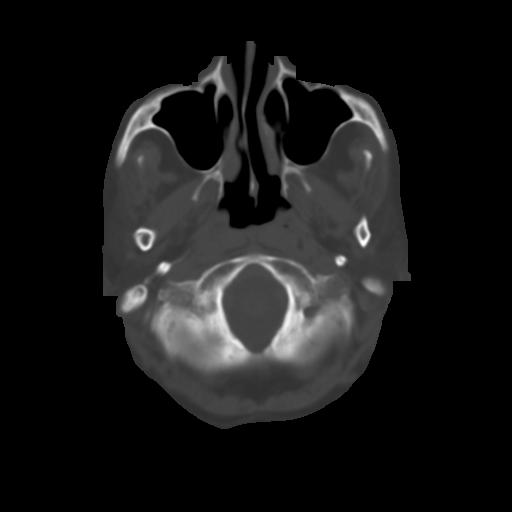
[im 6/31  brain]
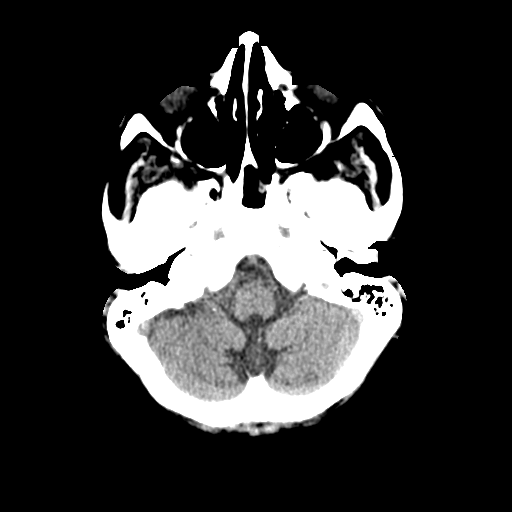
[im 9/31  brain]
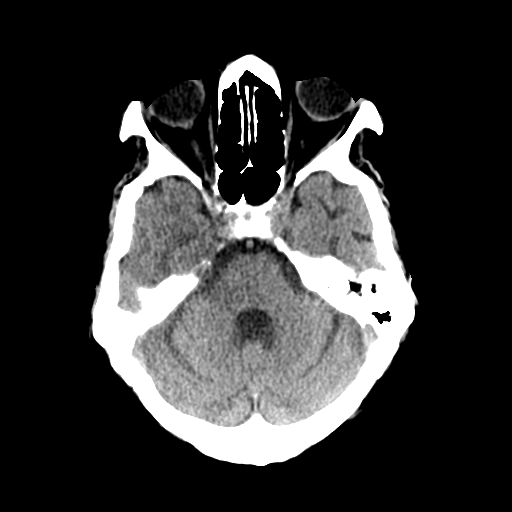
[im 12/31  brain]
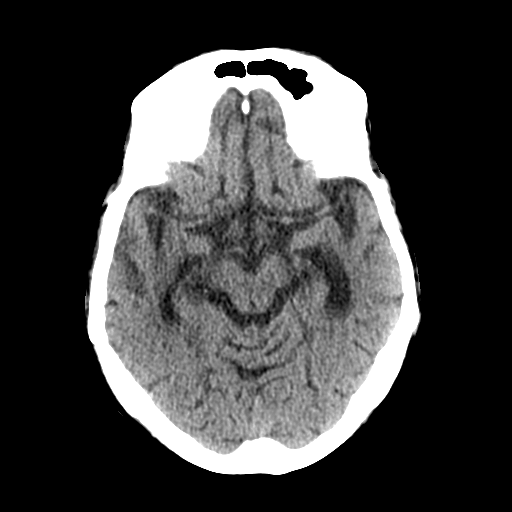
[im 16/31  brain]
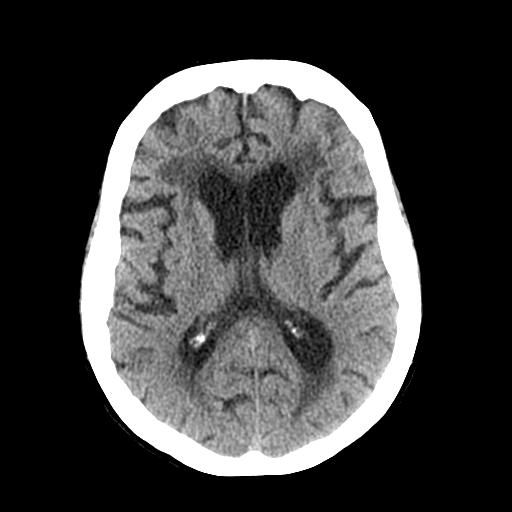
[im 16/31  bone]
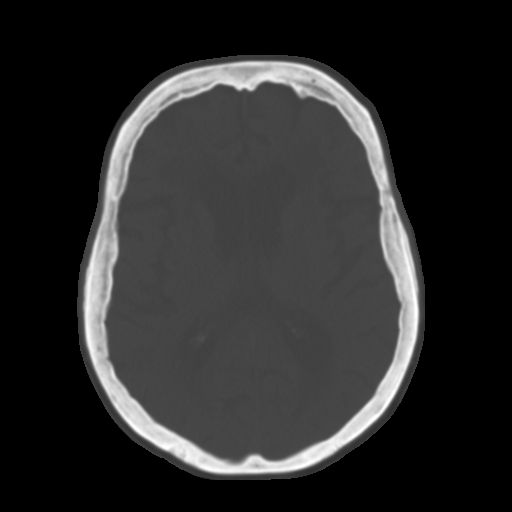
[im 19/31  brain]
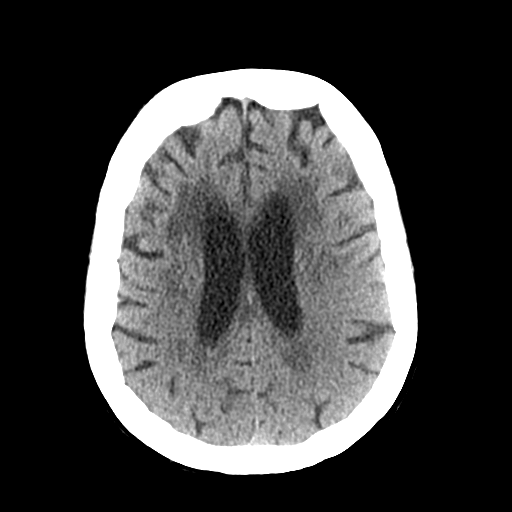
[im 22/31  brain]
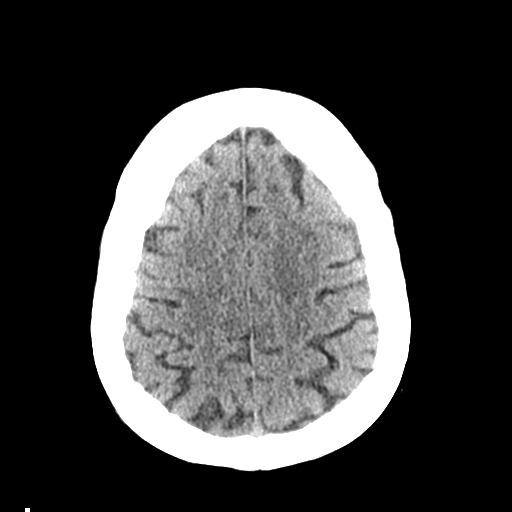
[im 25/31  brain]
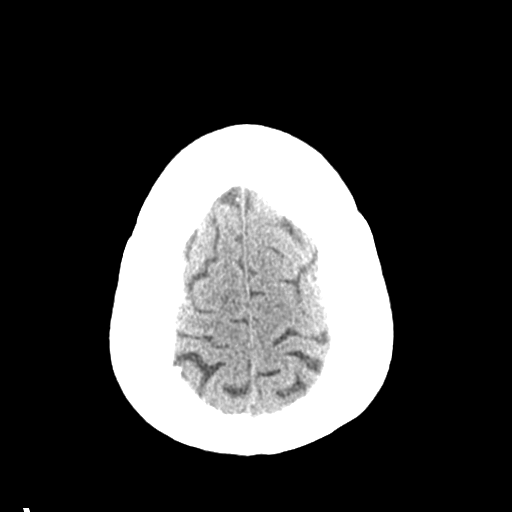
[im 28/31  brain]
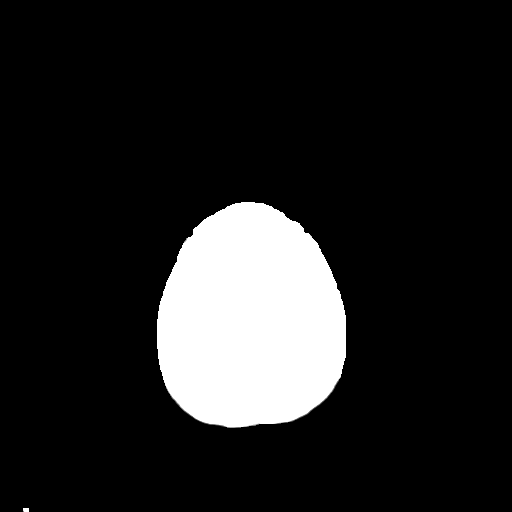
[im 28/31  bone]
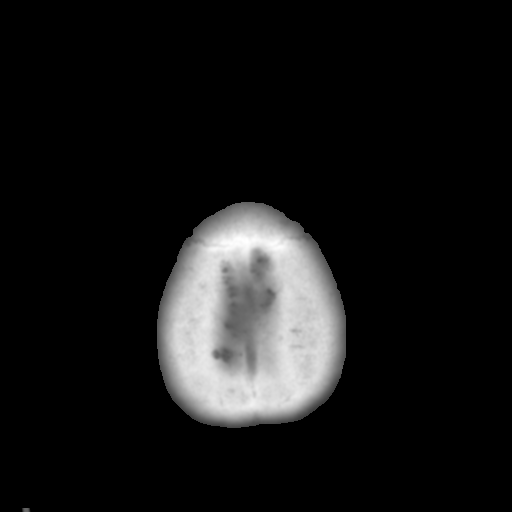

[Series 5: coronal soft tissue · coronal · 0.32mm/px · 3 of 67 slices shown]
[im 23/67  brain]
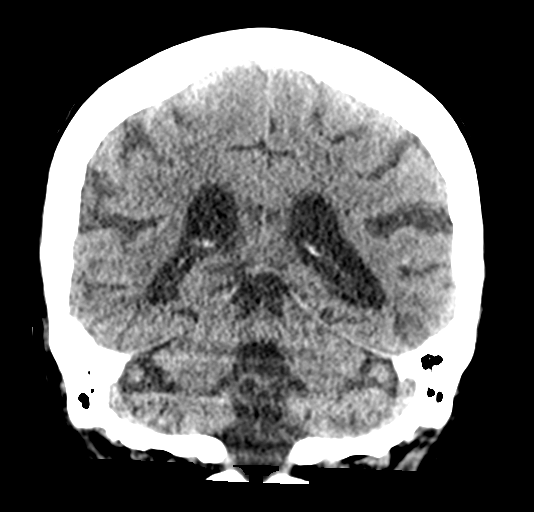
[im 30/67  brain]
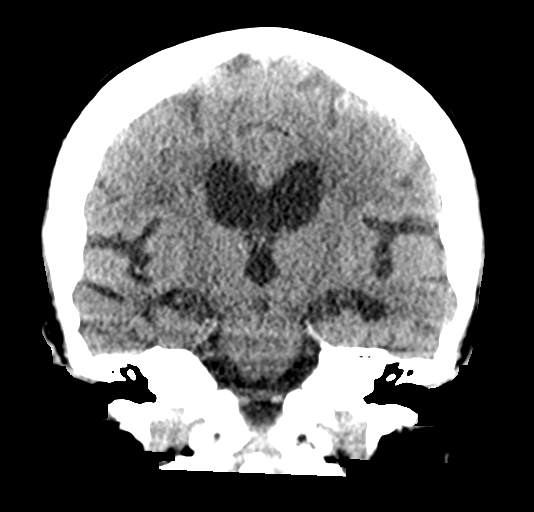
[im 37/67  brain]
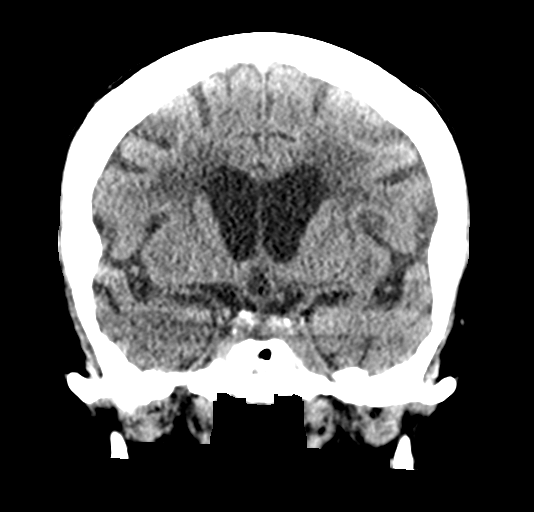

[Series 6: sagittal soft tissue · sagittal · 0.32mm/px · 3 of 51 slices shown]
[im 17/51  brain]
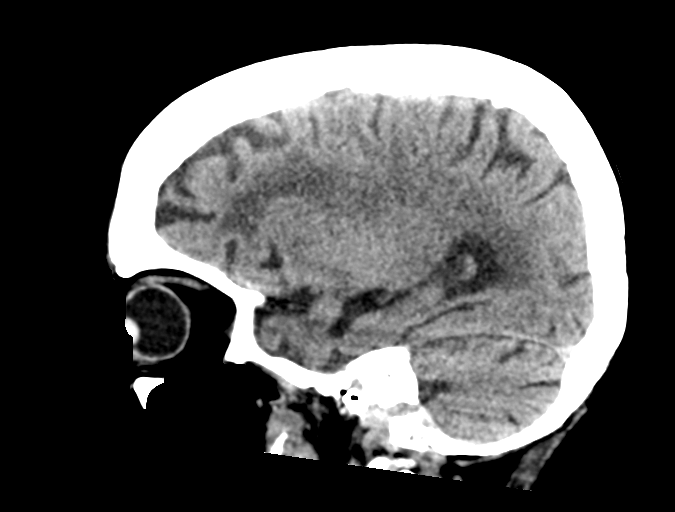
[im 26/51  brain]
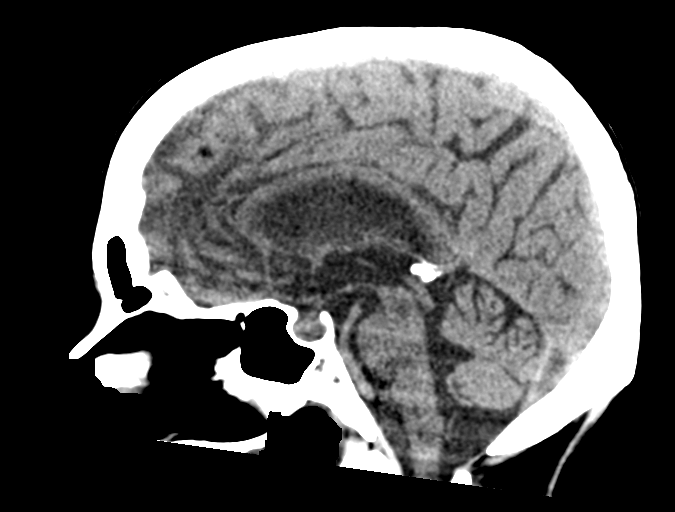
[im 34/51  brain]
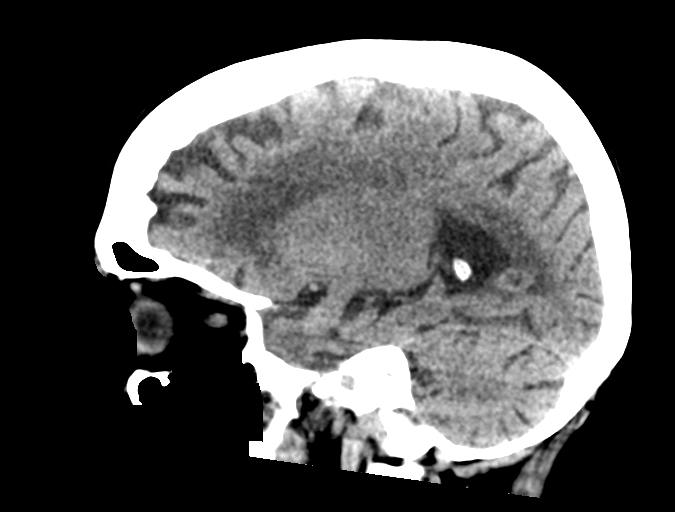

[15 of 47 positions shown; findings below may reference images not displayed]

FINDINGS: Brain: No evidence of acute infarction, hemorrhage, hydrocephalus,
extra-axial collection or mass lesion/mass effect. Moderate
low-density changes within the periventricular and subcortical white
matter compatible with chronic microvascular ischemic change. Mild
diffuse cerebral volume loss.

Vascular: Atherosclerotic calcifications involving the large vessels
of the skull base. No unexpected hyperdense vessel.

Skull: Normal. Negative for fracture or focal lesion.

Sinuses/Orbits: No acute finding.

Other: None.
IMPRESSION: 1. No acute intracranial findings.
2. Chronic microvascular ischemic change and cerebral volume loss.

## 2020-11-09 IMAGING — DX DG CHEST 1V PORT
1 series · 1 of 1 positions shown · non-contrast
Comparison: [DATE]

CLINICAL DATA: post intubation

EXAM:
PORTABLE CHEST 1 VIEW

[chest ap]
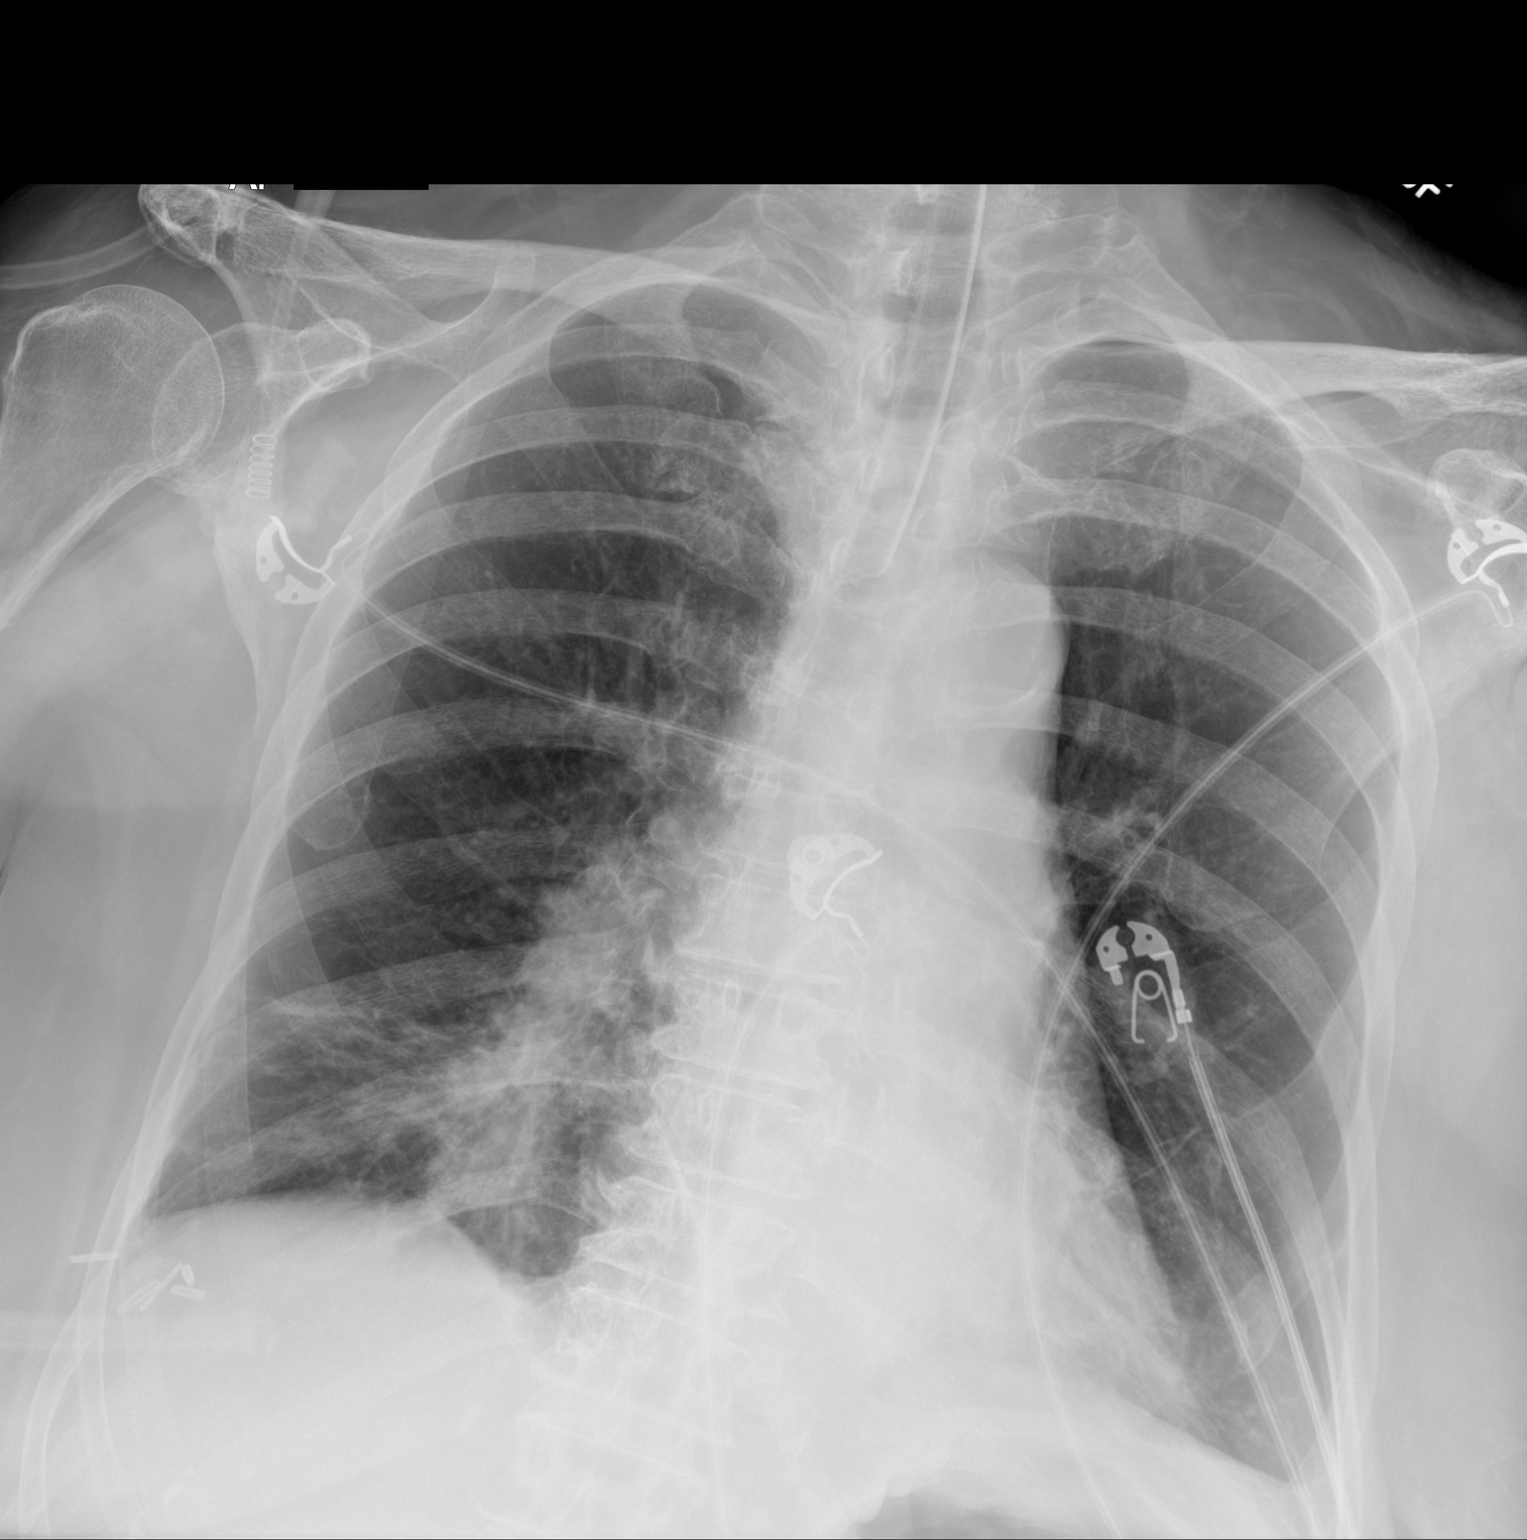

[1 of 1 positions shown; findings below may reference images not displayed]

FINDINGS: In tracheal tube tip overlies the midthoracic trachea. Unchanged
cardiomediastinal silhouette with aortic arch calcifications. There
are bibasilar opacities, right greater than left. No large pleural
effusion or visible pneumothorax. No acute osseous abnormality.
IMPRESSION: Endotracheal tube tip overlies the midthoracic trachea.

Bibasilar opacities, right greater than left, which could be
aspiration/infection.

## 2020-11-09 IMAGING — DX DG ABDOMEN 1V
1 series · 1 of 1 positions shown · non-contrast
Comparison: None.

CLINICAL DATA: OG tube placement.

EXAM:
ABDOMEN - 1 VIEW

[abdomen supine]
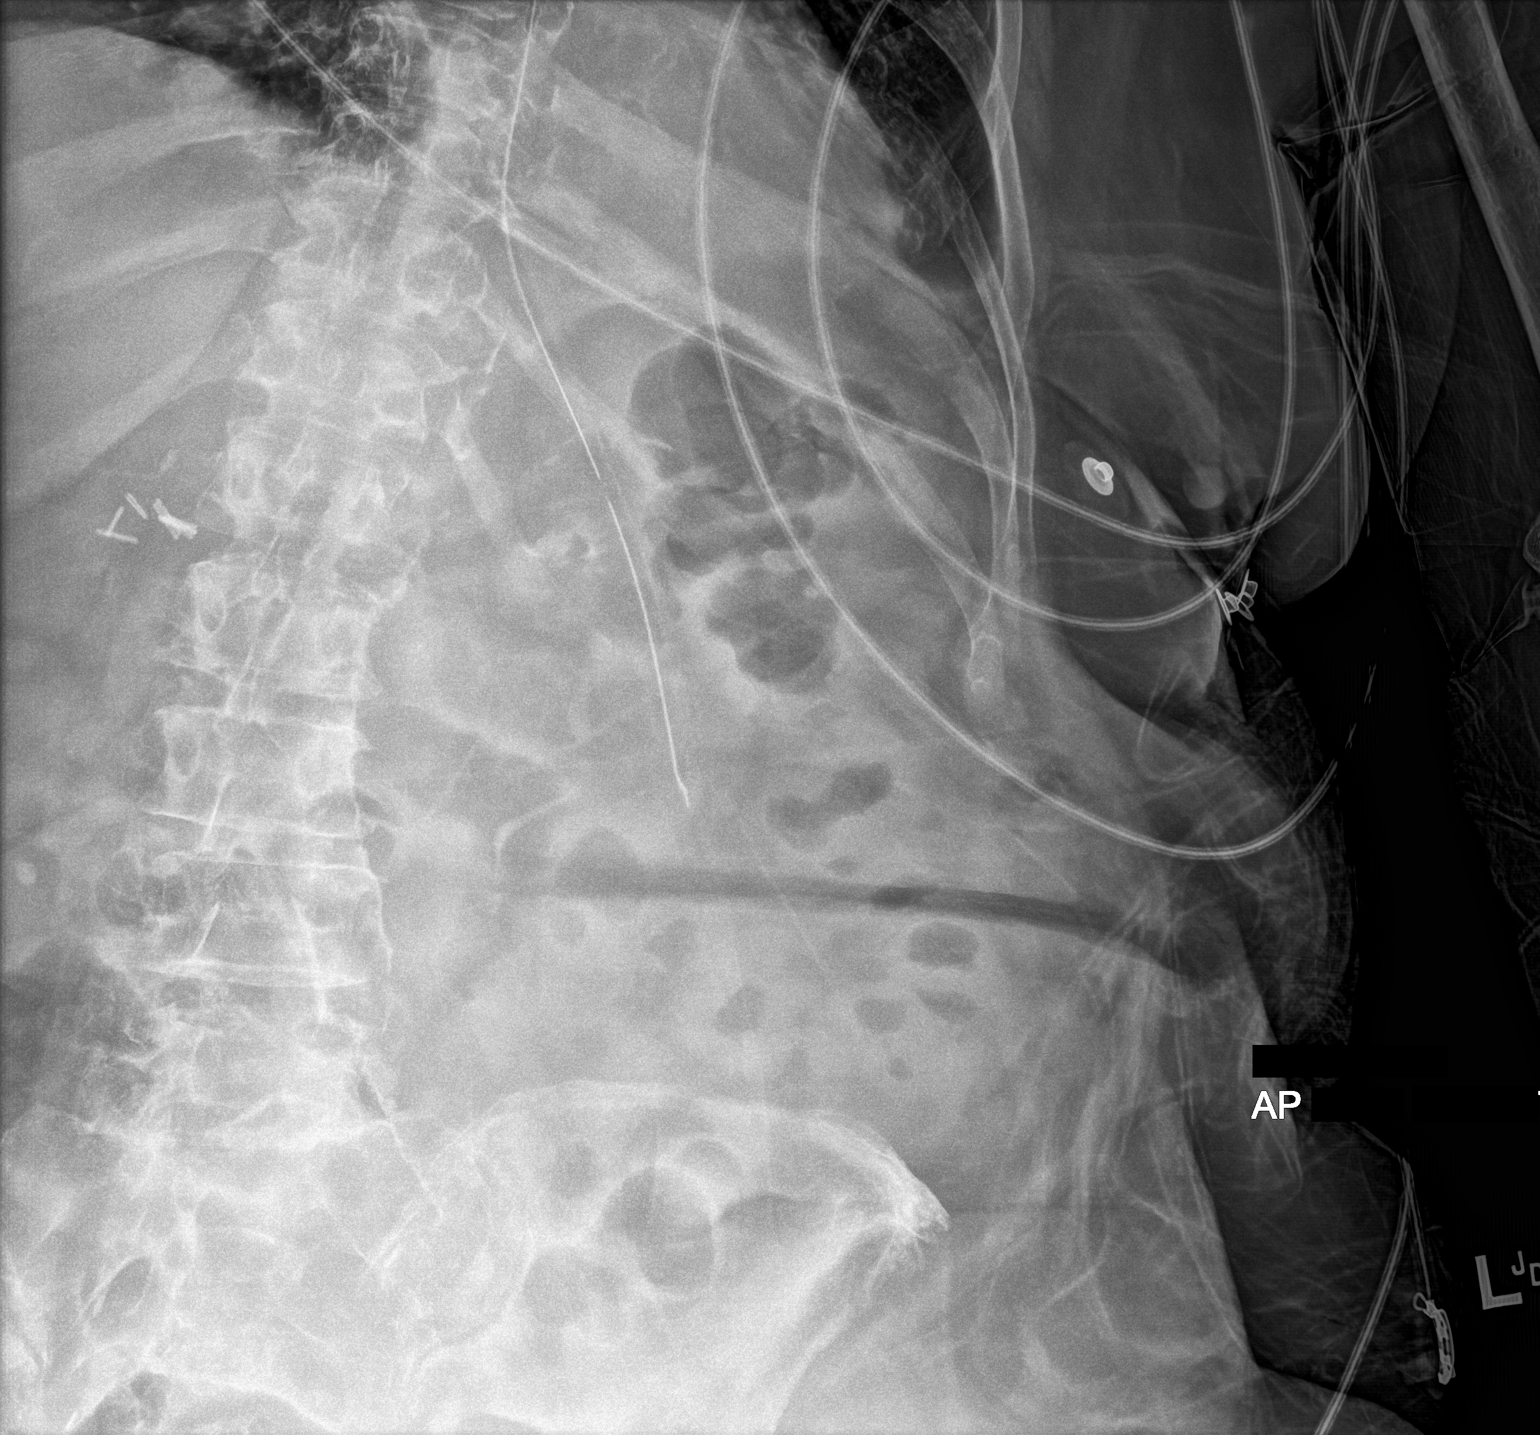

[1 of 1 positions shown; findings below may reference images not displayed]

FINDINGS: Tip and side port of the enteric tube below the diaphragm in the
stomach. Nonobstructive visualized bowel gas pattern. Right upper
quadrant surgical clips consistent with cholecystectomy.
IMPRESSION: Tip and side port of the enteric tube below the diaphragm in the
stomach.

## 2020-11-09 IMAGING — CT CT ANGIO CHEST
2 of 6 series · 18 of 36 positions shown · IV contrast (APPLIED)
Comparison: Chest radiograph of earlier today. No prior CT.

CLINICAL DATA: Unresponsive.  Responsive to painful stimuli.

EXAM:
CT ANGIOGRAPHY CHEST WITH CONTRAST
TECHNIQUE: Multidetector CT imaging of the chest was performed using the
standard protocol during bolus administration of intravenous
contrast. Multiplanar CT image reconstructions and MIPs were
obtained to evaluate the vascular anatomy.
CONTRAST:  75mL OMNIPAQUE IOHEXOL 350 MG/ML SOLN

[Series 7: thins · axial · 0.64mm/px · z∈[-519,-278]mm · 17 of 333 slices shown]
[im 16/333  lung]
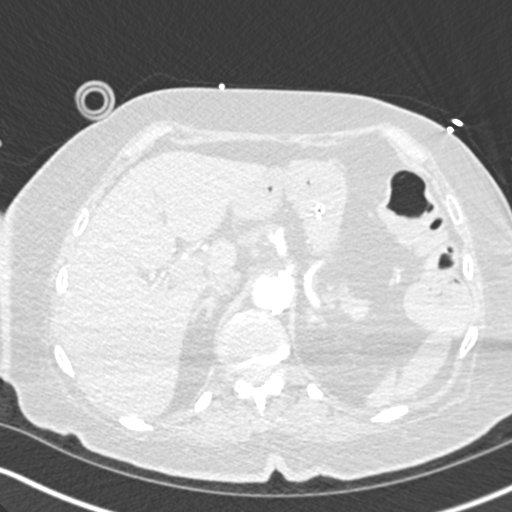
[im 32/333  mediastinal]
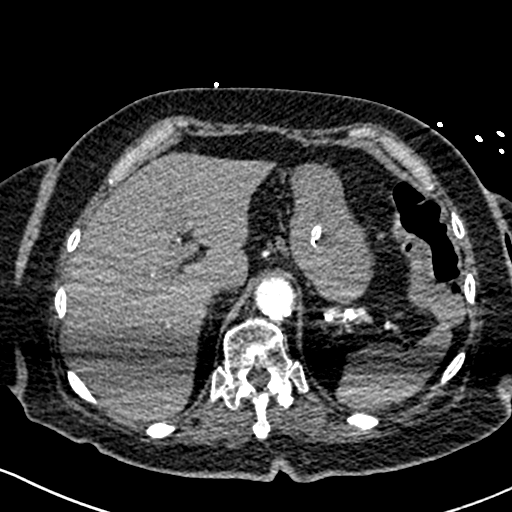
[im 48/333  lung]
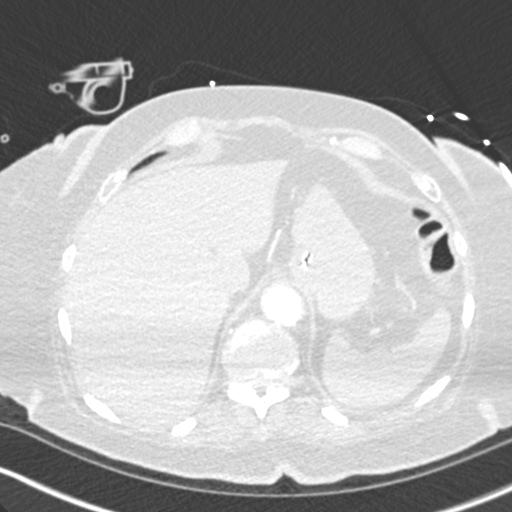
[im 80/333  mediastinal]
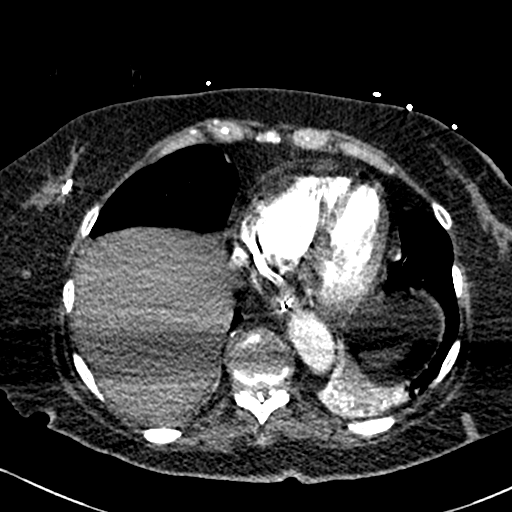
[im 95/333  lung]
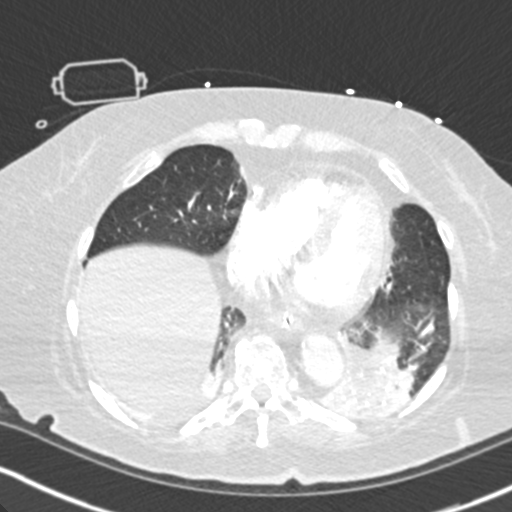
[im 111/333  mediastinal]
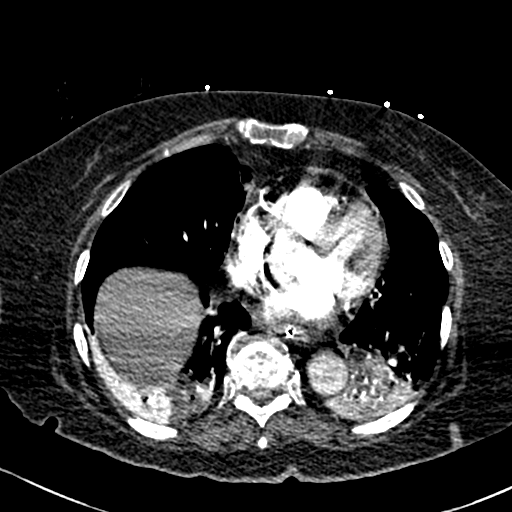
[im 127/333  lung]
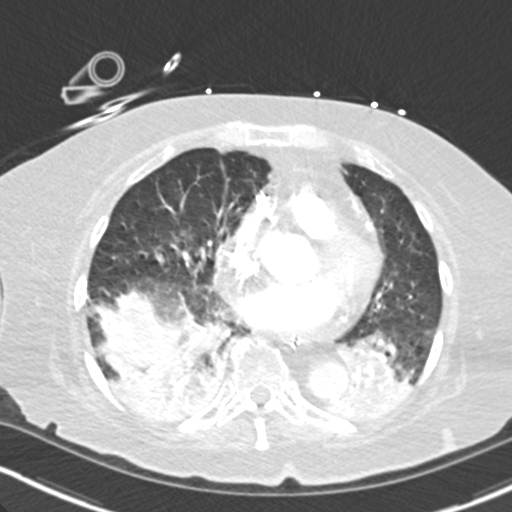
[im 143/333  mediastinal]
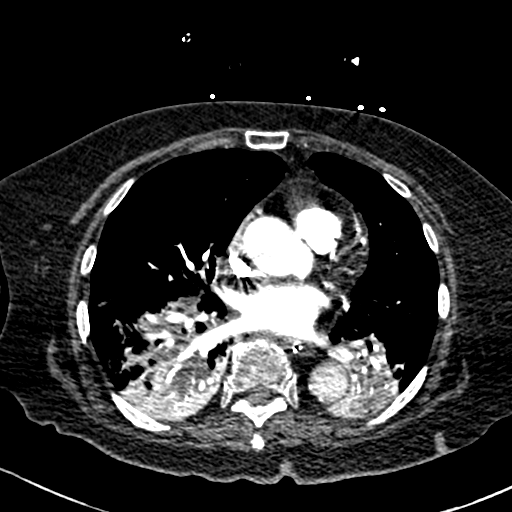
[im 174/333  lung]
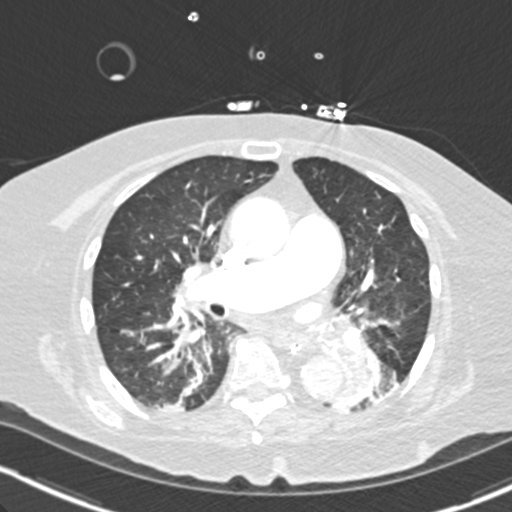
[im 190/333  mediastinal]
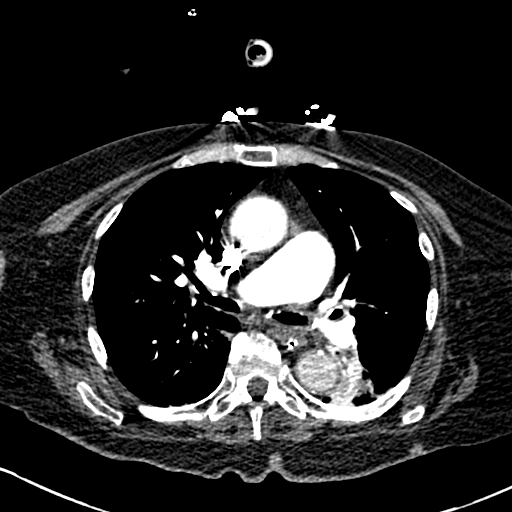
[im 206/333  lung]
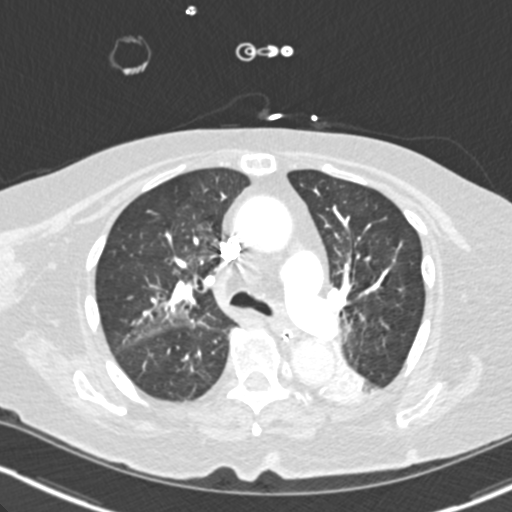
[im 222/333  mediastinal]
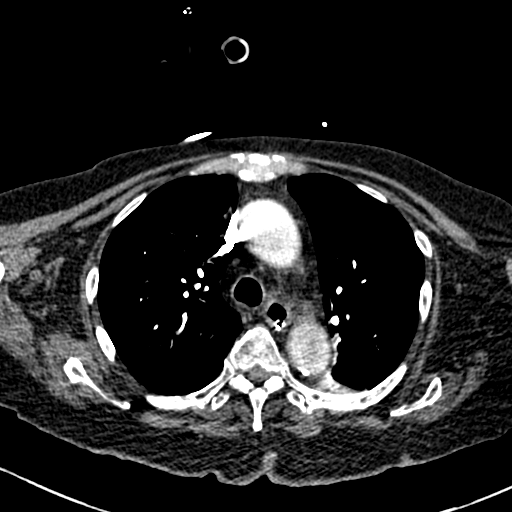
[im 238/333  lung]
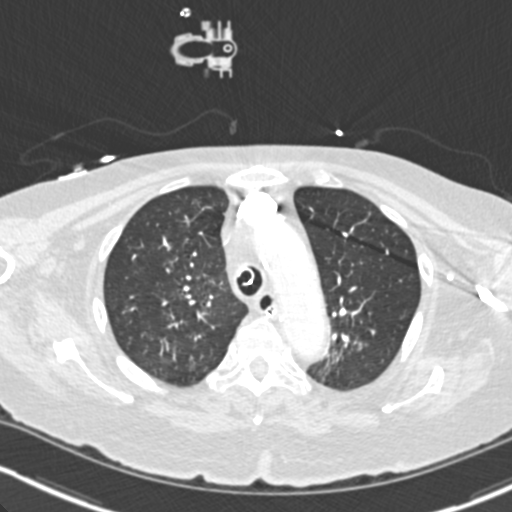
[im 253/333  mediastinal]
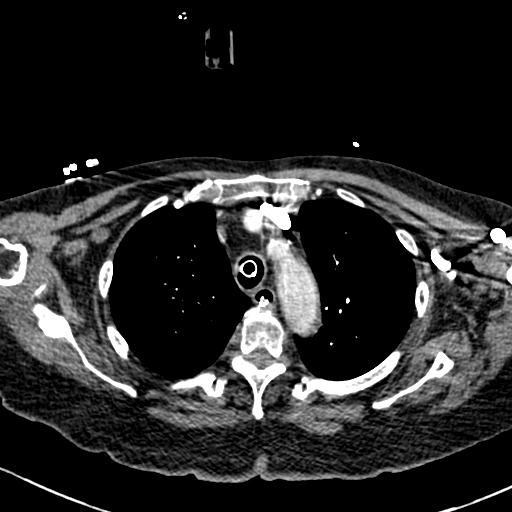
[im 285/333  lung]
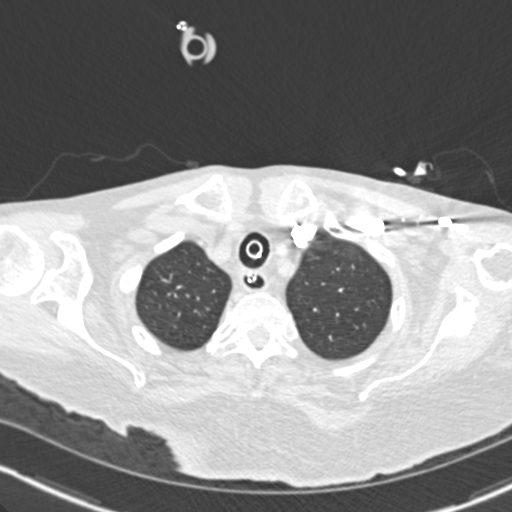
[im 301/333  mediastinal]
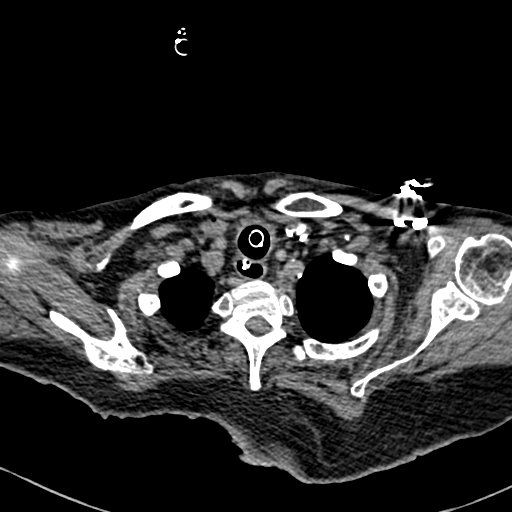
[im 317/333  lung]
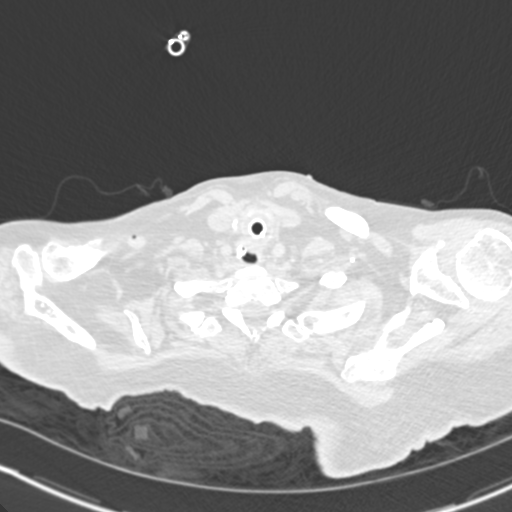

[Series 9: coronal mpr · coronal · 0.57mm/px · 1 of 84 slices shown]
[im 42/84  mediastinal]
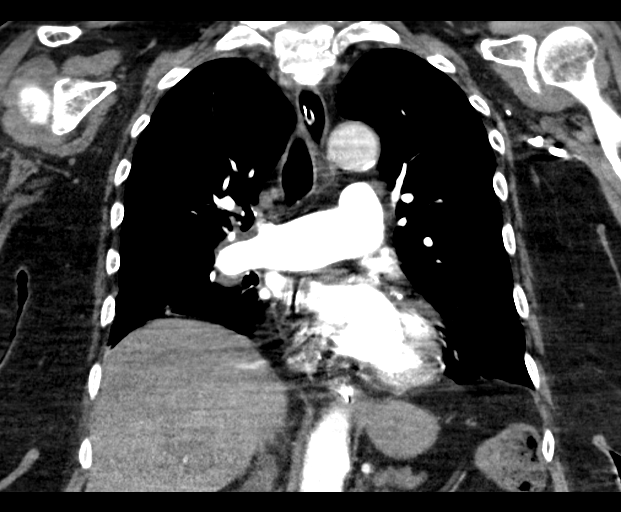

[18 of 36 positions shown; findings below may reference images not displayed]

FINDINGS: Cardiovascular: The quality of this exam for evaluation of pulmonary
embolism is good. No evidence of pulmonary embolism.

Aortic atherosclerosis. Normal heart size, without pericardial
effusion. Multivessel coronary artery atherosclerosis. Pulmonary
artery enlargement, outflow tract 3.5 cm

Mediastinum/Nodes: No mediastinal or hilar adenopathy. Nasogastric
tube terminates at the proximal stomach.

Lungs/Pleura: No pleural fluid. Endotracheal tube terminates above
the carina. Material within both endobronchial trees to the lower
lobes including on 50/8 and 43/8.

Moderate centrilobular emphysema.

Dense consolidation within both dependent lower lobes.

Upper Abdomen: Cholecystectomy. Normal imaged portions of the liver,
spleen, stomach, adrenal glands, kidneys.

Musculoskeletal: Right sided lumpectomy clips. Remote anterior lower
left rib fractures.

Review of the MIP images confirms the above findings.
IMPRESSION: 1. No evidence of pulmonary embolism.
2. Dense bilateral lower lobe consolidation, combined with material
in both endobronchial trees, highly suspicious for aspiration.
Infection felt less likely.
3. Pulmonary artery enlargement suggests pulmonary arterial
hypertension.
4. Aortic Atherosclerosis ([VH]-[VH]) and Emphysema ([VH]-[VH]).
Coronary artery atherosclerosis.

## 2020-11-09 MED ORDER — ETOMIDATE 2 MG/ML IV SOLN
INTRAVENOUS | Status: AC | PRN
  Administered 2020-11-09: 20 mg via INTRAVENOUS

## 2020-11-09 MED ORDER — VANCOMYCIN HCL IN DEXTROSE 1-5 GM/200ML-% IV SOLN: 1000.0000 mg | Freq: Once | INTRAVENOUS | Status: DC

## 2020-11-09 MED ORDER — SODIUM CHLORIDE 0.9 % IV SOLN: 2.0000 g | Freq: Three times a day (TID) | INTRAVENOUS | Status: DC

## 2020-11-09 MED ORDER — SODIUM CHLORIDE 0.9 % IV BOLUS
1000.0000 mL | Freq: Once | INTRAVENOUS | Status: AC
  Administered 2020-11-09: 1000 mL via INTRAVENOUS

## 2020-11-09 MED ORDER — IOHEXOL 350 MG/ML SOLN
75.0000 mL | Freq: Once | INTRAVENOUS | Status: AC | PRN
  Administered 2020-11-09: 75 mL via INTRAVENOUS

## 2020-11-09 MED ORDER — CHLORHEXIDINE GLUCONATE CLOTH 2 % EX PADS
6.0000 | MEDICATED_PAD | Freq: Every day | CUTANEOUS | Status: DC
  Administered 2020-11-10 – 2020-11-18 (×10): 6 via TOPICAL

## 2020-11-09 MED ORDER — SODIUM CHLORIDE 0.9 % IV BOLUS: 1000.0000 mL | Freq: Once | INTRAVENOUS | Status: DC

## 2020-11-09 MED ORDER — DOCUSATE SODIUM 100 MG PO CAPS: 100.0000 mg | ORAL_CAPSULE | Freq: Two times a day (BID) | ORAL | Status: DC | PRN

## 2020-11-09 MED ORDER — VANCOMYCIN HCL 1250 MG/250ML IV SOLN: 1250.0000 mg | INTRAVENOUS | Status: DC

## 2020-11-09 MED ORDER — LACTATED RINGERS IV BOLUS
1000.0000 mL | Freq: Once | INTRAVENOUS | Status: AC
  Administered 2020-11-09: 1000 mL via INTRAVENOUS

## 2020-11-09 MED ORDER — NOREPINEPHRINE 4 MG/250ML-% IV SOLN
INTRAVENOUS | Status: AC
  Administered 2020-11-09: 4 ug/min via INTRAVENOUS
  Filled 2020-11-09: qty 250

## 2020-11-09 MED ORDER — ENOXAPARIN SODIUM 40 MG/0.4ML IJ SOSY
40.0000 mg | PREFILLED_SYRINGE | INTRAMUSCULAR | Status: DC
  Administered 2020-11-09 – 2020-11-18 (×10): 40 mg via SUBCUTANEOUS
  Filled 2020-11-09 (×10): qty 0.4

## 2020-11-09 MED ORDER — CHLORHEXIDINE GLUCONATE 0.12% ORAL RINSE (MEDLINE KIT)
15.0000 mL | Freq: Two times a day (BID) | OROMUCOSAL | Status: DC
  Administered 2020-11-09 – 2020-11-19 (×19): 15 mL via OROMUCOSAL

## 2020-11-09 MED ORDER — VANCOMYCIN HCL 1500 MG/300ML IV SOLN
1500.0000 mg | Freq: Once | INTRAVENOUS | Status: AC
  Administered 2020-11-09: 1500 mg via INTRAVENOUS
  Filled 2020-11-09: qty 300

## 2020-11-09 MED ORDER — MIDAZOLAM 50MG/50ML (1MG/ML) PREMIX INFUSION
0.5000 mg/h | INTRAVENOUS | Status: DC
Start: 2020-11-09 — End: 2020-11-19
  Administered 2020-11-09: 0.5 mg/h via INTRAVENOUS
  Administered 2020-11-10: 1 mg/h via INTRAVENOUS
  Administered 2020-11-12 – 2020-11-17 (×8): 3 mg/h via INTRAVENOUS
  Administered 2020-11-17 – 2020-11-18 (×3): 6 mg/h via INTRAVENOUS
  Administered 2020-11-18: 2 mg/h via INTRAVENOUS
  Administered 2020-11-19: 4 mg/h via INTRAVENOUS
  Filled 2020-11-09 (×15): qty 50

## 2020-11-09 MED ORDER — IPRATROPIUM-ALBUTEROL 0.5-2.5 (3) MG/3ML IN SOLN: 3.0000 mL | RESPIRATORY_TRACT | Status: DC | PRN

## 2020-11-09 MED ORDER — METRONIDAZOLE 500 MG/100ML IV SOLN
500.0000 mg | Freq: Once | INTRAVENOUS | Status: AC
Start: 2020-11-09 — End: 2020-11-09
  Administered 2020-11-09: 500 mg via INTRAVENOUS
  Filled 2020-11-09: qty 100

## 2020-11-09 MED ORDER — FENTANYL 2500MCG IN NS 250ML (10MCG/ML) PREMIX INFUSION
0.0000 ug/h | INTRAVENOUS | Status: DC
  Administered 2020-11-12 – 2020-11-18 (×7): 100 ug/h via INTRAVENOUS
  Filled 2020-11-09 (×7): qty 250

## 2020-11-09 MED ORDER — SODIUM CHLORIDE 0.9 % IV SOLN
250.0000 mL | INTRAVENOUS | Status: DC
  Administered 2020-11-15: 250 mL via INTRAVENOUS

## 2020-11-09 MED ORDER — ONDANSETRON HCL 4 MG/2ML IJ SOLN: 4.0000 mg | Freq: Four times a day (QID) | INTRAMUSCULAR | Status: DC | PRN

## 2020-11-09 MED ORDER — POLYETHYLENE GLYCOL 3350 17 G PO PACK: 17.0000 g | PACK | Freq: Every day | ORAL | Status: DC | PRN

## 2020-11-09 MED ORDER — FENTANYL 2500MCG IN NS 250ML (10MCG/ML) PREMIX INFUSION
INTRAVENOUS | Status: AC
  Administered 2020-11-09: 25 ug/h via INTRAVENOUS
  Filled 2020-11-09: qty 250

## 2020-11-09 MED ORDER — LACTATED RINGERS IV SOLN: INTRAVENOUS | Status: DC

## 2020-11-09 MED ORDER — ROCURONIUM BROMIDE 50 MG/5ML IV SOLN
INTRAVENOUS | Status: AC | PRN
  Administered 2020-11-09: 100 mg via INTRAVENOUS

## 2020-11-09 MED ORDER — SODIUM CHLORIDE 0.9 % IV SOLN
INTRAVENOUS | Status: AC | PRN
  Administered 2020-11-09: 1000 mL via INTRAVENOUS

## 2020-11-09 MED ORDER — NOREPINEPHRINE 4 MG/250ML-% IV SOLN
2.0000 ug/min | INTRAVENOUS | Status: DC
  Administered 2020-11-10 (×2): 7 ug/min via INTRAVENOUS
  Administered 2020-11-11: 4 ug/min via INTRAVENOUS
  Administered 2020-11-12: 7 ug/min via INTRAVENOUS
  Administered 2020-11-12: 10 ug/min via INTRAVENOUS
  Filled 2020-11-09 (×7): qty 250

## 2020-11-09 MED ORDER — ORAL CARE MOUTH RINSE
15.0000 mL | OROMUCOSAL | Status: DC
  Administered 2020-11-10 – 2020-11-19 (×93): 15 mL via OROMUCOSAL

## 2020-11-09 MED ORDER — ACETAMINOPHEN 325 MG PO TABS: 650.0000 mg | ORAL_TABLET | ORAL | Status: DC | PRN

## 2020-11-09 MED ORDER — SODIUM CHLORIDE 0.9 % IV SOLN
2.0000 g | Freq: Once | INTRAVENOUS | Status: AC
  Administered 2020-11-09: 2 g via INTRAVENOUS
  Filled 2020-11-09: qty 2

## 2020-11-09 MED ORDER — METRONIDAZOLE 500 MG/100ML IV SOLN
500.0000 mg | Freq: Three times a day (TID) | INTRAVENOUS | Status: DC
  Administered 2020-11-10 – 2020-11-13 (×11): 500 mg via INTRAVENOUS
  Filled 2020-11-09 (×15): qty 100

## 2020-11-09 MED ORDER — FAMOTIDINE IN NACL 20-0.9 MG/50ML-% IV SOLN
20.0000 mg | Freq: Two times a day (BID) | INTRAVENOUS | Status: DC
  Administered 2020-11-09 – 2020-11-19 (×21): 20 mg via INTRAVENOUS
  Filled 2020-11-09 (×21): qty 50

## 2020-11-09 MED ORDER — SODIUM CHLORIDE 0.9 % IV SOLN
2.0000 g | Freq: Two times a day (BID) | INTRAVENOUS | Status: DC
  Administered 2020-11-09 – 2020-11-13 (×8): 2 g via INTRAVENOUS
  Filled 2020-11-09 (×9): qty 2

## 2020-11-09 NOTE — Sepsis Progress Note (Signed)
Per secure chat - OK to retime 2000 LA to now STAT to have drawn inside 6hr sepsis bundle.

## 2020-11-09 NOTE — Consult Note (Signed)
PHARMACY CONSULT NOTE - FOLLOW UP  Pharmacy Consult for Electrolyte Monitoring and Replacement   Recent Labs: Potassium (mmol/L)  Date Value  11/16/2020 4.2  02/25/2014 4.4   Magnesium (mg/dL)  Date Value  10/24/2020 1.8   Calcium (mg/dL)  Date Value  10/27/2020 9.4   Calcium, Total (mg/dL)  Date Value  02/25/2014 9.1   Albumin (g/dL)  Date Value  11/12/2020 2.4 (L)  02/25/2014 4.0   Phosphorus (mg/dL)  Date Value  09/19/2020 2.7   Sodium (mmol/L)  Date Value  10/20/2020 145  02/25/2014 139     Assessment: 75 yo female with HTN, hypothyroid, and diabetes admitted with likely aspiration PNA and AMS now intubated.  Pharmacy has been consulted to monitor electrolyte per E-link protocol.   Goal of Therapy:  Electrolytes wnl's  Plan:  No replenishment warranted at this time - will follow electrolytes with am labs.  Lu Duffel, PharmD, BCPS Clinical Pharmacist 11/08/2020 2:34 PM

## 2020-11-09 NOTE — ED Notes (Signed)
Assumed care at 3pm of pt , the pt had been extremely hypotensive prior to my arrival . The pt was on Versed and Fentanyl for pain and sedation, I  Talked to the provider and he agreed to stop the fentanyl and give her a bolus of ringers .  After these interventions the pt  B/p is now stabilized

## 2020-11-09 NOTE — ED Notes (Signed)
RT at bedside to increase PEEP to 14

## 2020-11-09 NOTE — Progress Notes (Signed)
The ICU Dr. Hanley Seamen me a verbal order to increase the PEEP to 14 to improve oxygenation. The PEEP was increased and a b/p was taken. The B/P dropped to 67 systolic. The PEEP was then decreased back to 8. The B/P then increased to 87 systolic.

## 2020-11-09 NOTE — ED Notes (Signed)
Pt was hypotensive when I received report . Pt has versed and  Fentanyl  for sedition/ pain . I notified Admitting MD .  I stopped the Fentanyl

## 2020-11-09 NOTE — Consult Note (Signed)
PHARMACY -  BRIEF ANTIBIOTIC NOTE   Pharmacy has received consult(s) for Vancomycin/Cefepime from an ED provider.    The patient's profile has been reviewed for ht/wt/allergies/indication/available labs.    One time order(s) placed for Vancomycin 1500mg  IV x 1, Cefepime 2g IV x 1  Further antibiotics/pharmacy consults should be ordered by admitting physician if indicated.                       Thank you,  Lu Duffel, PharmD, BCPS Clinical Pharmacist 10/27/2020 12:29 PM

## 2020-11-09 NOTE — Code Documentation (Signed)
cbg 179

## 2020-11-09 NOTE — Code Documentation (Signed)
RT at bedside preparing for intubation

## 2020-11-09 NOTE — Consult Note (Addendum)
Pharmacy Antibiotic Note  [name] is a 75 y.o. female admitted on 11/07/2020 with aspiration pneumonia.    Pharmacy has been consulted for Vancomycin/Cefepime dosing.  Plan: Will give vancomycin 1500mg  IV loading dose, followed by  Vancomycin 1250 mg IV Q 48 hrs.  Goal AUC 400-550. Expected AUC: 473 SCr used: 1.03  Will continue cefepime with 2g q12h Will continue metronidazole with 500mg  IV q8h Will check MRSA PCR  Height: 5\' 3"  (160 cm) Weight: 71.7 kg (158 lb) IBW/kg (Calculated) : 52.4  Temp (24hrs), Avg:99.6 F (37.6 C), Min:98.9 F (37.2 C), Max:100 F (37.8 C)  Recent Labs  Lab 11/10/2020 1211  WBC 14.4*  CREATININE 1.03*  LATICACIDVEN 2.2*    Estimated Creatinine Clearance: 44.8 mL/min (A) (by C-G formula based on SCr of 1.03 mg/dL (H)).    Allergies  Allergen Reactions   Navane [Thiothixene] Other (See Comments)    Reaction:  Unknown   Penicillins Rash    Has patient had a PCN reaction causing immediate rash, facial/tongue/throat swelling, SOB or lightheadedness with hypotension: No Has patient had a PCN reaction causing severe rash involving mucus membranes or skin necrosis: No Has patient had a PCN reaction that required hospitalization: No Has patient had a PCN reaction occurring within the last 10 years: No If all of the above answers are "NO", then may proceed with Cephalosporin use.  Has patient had a PCN reaction causing immediate rash, facial/tongue/throat swelling, SOB or lightheadedness with hypotension: No Has patient had a PCN reaction causing severe rash involving mucus membranes or skin necrosis: No Has patient had a PCN reaction that required hospitalization: No Has patient had a PCN reaction occurring within the last 10 years: No If all of the above answers are "NO", then may proceed with Cephalosporin use. Has patient had a PCN reaction causing immediate rash, facial/tongue/throat swelling, SOB or lightheadedness with  hypotension: No Has patient had a PCN reaction causing severe rash involving mucus membranes or skin necrosis: No Has patient had a PCN reaction that required hospitalization: No Has patient had a PCN reaction occurring within the last 10 years: No If all of the above answers are "NO", then may proceed with Cephalosporin use.    Antimicrobials this admission: Cefepime 6/23 >> Vancomycin 6/23 >> Metronidazole  Dose adjustments this admission: None  Microbiology results: 6/23 BCx: pending 6/23 UCx: pending  6/23 Resop Cx from Tracheal Aspirate: pending 6/23 MRSA PCR: pending  6/23 FLU/COVID NEG  Thank you for allowing pharmacy to be a part of this patient's care.  Lu Duffel, PharmD, BCPS Clinical Pharmacist 11/06/2020 2:54 PM

## 2020-11-09 NOTE — Code Documentation (Signed)
23 at lip 7.5 ETT tube placed at this time. Tube placement confirmed by CO2 color change and bilateral lung sounds

## 2020-11-09 NOTE — ED Notes (Signed)
Red top recollected and sent to lab.

## 2020-11-09 NOTE — Progress Notes (Signed)
Pt was transported to CT from the ED and back while on the vent.

## 2020-11-09 NOTE — ED Provider Notes (Signed)
[facility] Emergency Department Provider Note  ____________________________________________   Event Date/Time   First MD Initiated Contact with Patient 11/10/2020 1220     (approximate)  I have reviewed the triage vital signs and the nursing notes.   HISTORY  Chief Complaint Respiratory Distress    HPI [name] is a 75 y.o. female with hypertension, hypothyroidism, depression who comes in with respiratory distress.  Patient comes in from [facility] after being found unresponsive with room air sats of 65%.  Patient placed on 15 L nonrebreather with oxygen up to 86%.  Patient's altered and unresponsive to stimulation.  Unable to get full HPI due to altered mental status   On review of records patient had admission on 4/29 secondary to acute hypoxic respiratory failure secondary to aspiration of a hot dog who is status post intubation and bronc at that time.  She was extubated on 5/1.     Past Medical History:  Diagnosis Date   Arthritis    Breast cancer ([place])    Cancer ([place]) 2004   right breast ca   Diabetes mellitus without complication ([place])    Hypercholesterolemia    Hypertension    Hypothyroid    Seasonal allergies     Patient Active Problem List   Diagnosis Date Noted   UTI (urinary tract infection) 10/25/2020   Altered mental status 10/23/2020   SOB (shortness of breath)    Endotracheally intubated    Acute respiratory failure with hypoxia ([place]) 09/16/2020   Acute hypoxemic respiratory failure ([place]) 09/16/2020   Hypomagnesemia    Aspiration pneumonia ([place]) 09/15/2020   Generalized weakness 08/22/2020   Lower urinary tract infectious disease 08/22/2020   Unable to care for self 08/22/2020   Bipolar 1 disorder ([place]) 08/22/2020   Weakness 08/22/2020   AKI (acute kidney injury) ([place]) 08/22/2020   Bipolar disorder current episode depressed ([place]) 01/07/2020   MDD (major depressive disorder) 08/10/2019   MDD (major  depressive disorder), recurrent episode, severe ([place]) 06/24/2019   Chronic kidney disease, stage 3 unspecified ([place]) 05/11/2019   Pain due to onychomycosis of toenails of both feet 11/23/2018   Bipolar 1 disorder, manic, moderate ([place]) 07/21/2018   Bipolar 1 disorder, mixed, severe ([place]) 07/21/2018   Bipolar 1 disorder with moderate mania ([place]) 07/20/2018   Severe bipolar I disorder, current or most recent episode depressed ([place]) 07/07/2017   PTSD (post-traumatic stress disorder) 07/07/2017   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/2016   Type 2 diabetes mellitus with hyperlipidemia ([place]) 09/23/2014   Essential hypertension 09/21/2014   Hypothyroidism 09/21/2014   Dyslipidemia 09/21/2014   Bipolar I disorder, current or most recent episode manic, severe with mixed features ([place]) 09/20/2014    Past Surgical History:  Procedure Laterality Date   ABDOMINAL HYSTERECTOMY     BREAST BIOPSY Left    neg   BREAST EXCISIONAL BIOPSY Right 2004   breast ca and lumpectomy   BREAST LUMPECTOMY Right 2004   breast ca   CHOLECYSTECTOMY     COLONOSCOPY WITH PROPOFOL N/A 02/13/2017   Procedure: COLONOSCOPY WITH PROPOFOL;  Surgeon: Jonathon Bellows, MD;  Location: [facility] ENDOSCOPY;  Service: Gastroenterology;  Laterality: N/A;   KIDNEY STONE SURGERY      Prior to Admission medications   Medication Sig Start Date End Date Taking? Authorizing Provider  acetaminophen (TYLENOL) 500 MG tablet Take 500 mg by mouth every 6 (six) hours as needed for mild pain.    [provider]  clonazePAM (KLONOPIN) 1 MG tablet Take 1 tablet (1 mg total) by mouth 2 (two) times daily. 10/27/20   Max Sane, MD  divalproex (DEPAKOTE ER) 500 MG 24 hr tablet Take 2 tablets (1,000 mg total) by mouth at bedtime. 01/11/20   Clapacs, Madie Reno, MD  FLUoxetine (PROZAC) 20 MG capsule Take 60 mg by mouth daily.    [provider]  levothyroxine (SYNTHROID) 88 MCG tablet Take 88 mcg by mouth  daily before breakfast.    [provider]  linagliptin (TRADJENTA) 5 MG TABS tablet Take 5 mg by mouth daily.    [provider]  lurasidone (LATUDA) 20 MG TABS tablet Take 20 mg by mouth daily. 08/17/20   [provider]  metFORMIN (GLUCOPHAGE) 500 MG tablet Take 1 tablet (500 mg total) by mouth 2 (two) times daily with a meal. 10/27/20   Max Sane, MD  ondansetron (ZOFRAN-ODT) 4 MG disintegrating tablet Take 4 mg by mouth every 6 (six) hours as needed for nausea. 08/16/20   [provider]  rosuvastatin (CRESTOR) 40 MG tablet Take 1 tablet (40 mg total) by mouth daily. 01/11/20   Clapacs, Madie Reno, MD    Allergies Navane [thiothixene] and Penicillins  Family History  Problem Relation Age of Onset   Bladder Cancer Neg Hx    Kidney cancer Neg Hx     Social History Social History   Tobacco Use   Smoking status: Former    Pack years: 0.00    Types: Cigarettes   Smokeless tobacco: Never  Vaping Use   Vaping Use: Never used  Substance Use Topics   Alcohol use: No   Drug use: No      Review of Systems Unable to get full review of systems due to altered mental status ________________________   PHYSICAL EXAM:  VITAL SIGNS: ED Triage Vitals [11/02/2020 1212]  Enc Vitals Group     BP (!) 86/55     Pulse Rate (!) 102     Resp (!) 25     Temp      Temp src      SpO2 (!) 85 %     Weight      Height      Head Circumference      Peak Flow      Pain Score      Pain Loc      Pain Edu?      Excl. in [place]?     Constitutional: Altered, some mild moaning with moving over to the bed but otherwise unresponsive to sternal rub Eyes: Conjunctivae are normal.  Head: Atraumatic. Nose: No congestion/rhinnorhea. Mouth/Throat: Mucous membranes are moist.   Neck: No stridor. Trachea Midline. FROM Cardiovascular: Normal rate, regular rhythm. Grossly normal heart sounds.  Good peripheral circulation. Respiratory: Coarse breath sounds bilaterally with  significant hypoxia on nonrebreather. Gastrointestinal: Soft and nontender. No distention. No abdominal bruits.  Musculoskeletal: No lower extremity tenderness nor edema.  No joint effusions. Neurologic:  Normal speech and language. No gross focal neurologic deficits are appreciated.  Skin:  Skin is warm, dry and intact. No rash noted. Psychiatric: Mood and affect are normal. Speech and behavior are normal. GU: Deferred   ____________________________________________   LABS (all labs ordered are listed, but only abnormal results are displayed)  Labs Reviewed  CBG MONITORING, ED - Abnormal; Notable for the following components:      Result Value   Glucose-Capillary 179 (*)    All other components within  normal limits  CULTURE, BLOOD (ROUTINE X 2)  CULTURE, BLOOD (ROUTINE X 2)  RESP PANEL BY RT-PCR (FLU A&B, COVID) ARPGX2  CBC WITH DIFFERENTIAL/PLATELET  COMPREHENSIVE METABOLIC PANEL  LACTIC ACID, PLASMA  LACTIC ACID, PLASMA  BRAIN NATRIURETIC PEPTIDE  TSH  T4, FREE  URINALYSIS, COMPLETE (UACMP) WITH MICROSCOPIC  BLOOD GAS, VENOUS  TROPONIN I (HIGH SENSITIVITY)   ____________________________________________   ED ECG REPORT I, Vanessa [place], the attending physician, personally viewed and interpreted this ECG.  Sinus tachycardia rate of 103, no ST elevation, no T wave versions, QTC prolonged at 521 ____________________________________________  RADIOLOGY I, Vanessa [place], personally viewed and evaluated these images (plain radiographs) as part of my medical decision making, as well as reviewing the written report by the radiologist.  ED MD interpretation: Mild infiltrates bilaterally bibasilar  Official radiology report(s): CT Head Wo Contrast  Result Date: 11/16/2020 CLINICAL DATA:  Altered mental status, decreased responsiveness EXAM: CT HEAD WITHOUT CONTRAST TECHNIQUE: Contiguous axial images were obtained from the base of the skull through the vertex without intravenous  contrast. COMPARISON:  10/23/2020 FINDINGS: Brain: No evidence of acute infarction, hemorrhage, hydrocephalus, extra-axial collection or mass lesion/mass effect. Moderate low-density changes within the periventricular and subcortical white matter compatible with chronic microvascular ischemic change. Mild diffuse cerebral volume loss. Vascular: Atherosclerotic calcifications involving the large vessels of the skull base. No unexpected hyperdense vessel. Skull: Normal. Negative for fracture or focal lesion. Sinuses/Orbits: No acute finding. Other: None. IMPRESSION: 1. No acute intracranial findings. 2. Chronic microvascular ischemic change and cerebral volume loss. Electronically Signed   By: Davina Poke D.O.   On: 11/05/2020 14:41   CT Angio Chest PE W and/or Wo Contrast  Result Date: 11/12/2020 CLINICAL DATA:  Unresponsive.  Responsive to painful stimuli. EXAM: CT ANGIOGRAPHY CHEST WITH CONTRAST TECHNIQUE: Multidetector CT imaging of the chest was performed using the standard protocol during bolus administration of intravenous contrast. Multiplanar CT image reconstructions and MIPs were obtained to evaluate the vascular anatomy. CONTRAST:  43m OMNIPAQUE IOHEXOL 350 MG/ML SOLN COMPARISON:  Chest radiograph of earlier today. No prior CT. FINDINGS: Cardiovascular: The quality of this exam for evaluation of pulmonary embolism is good. No evidence of pulmonary embolism. Aortic atherosclerosis. Normal heart size, without pericardial effusion. Multivessel coronary artery atherosclerosis. Pulmonary artery enlargement, outflow tract 3.5 cm Mediastinum/Nodes: No mediastinal or hilar adenopathy. Nasogastric tube terminates at the proximal stomach. Lungs/Pleura: No pleural fluid. Endotracheal tube terminates above the carina. Material within both endobronchial trees to the lower lobes including on 50/8 and 43/8. Moderate centrilobular emphysema. Dense consolidation within both dependent lower lobes. Upper Abdomen:  Cholecystectomy. Normal imaged portions of the liver, spleen, stomach, adrenal glands, kidneys. Musculoskeletal: Right sided lumpectomy clips. Remote anterior lower left rib fractures. Review of the MIP images confirms the above findings. IMPRESSION: 1. No evidence of pulmonary embolism. 2. Dense bilateral lower lobe consolidation, combined with material in both endobronchial trees, highly suspicious for aspiration. Infection felt less likely. 3. Pulmonary artery enlargement suggests pulmonary arterial hypertension. 4. Aortic Atherosclerosis (ICD10-I70.0) and Emphysema (ICD10-J43.9). Coronary artery atherosclerosis. Electronically Signed   By: KAbigail MiyamotoM.D.   On: 10/22/2020 14:48   DG Chest Portable 1 View  Result Date: 10/28/2020 CLINICAL DATA:  post intubation EXAM: PORTABLE CHEST 1 VIEW COMPARISON:  10/23/2020 FINDINGS: In tracheal tube tip overlies the midthoracic trachea. Unchanged cardiomediastinal silhouette with aortic arch calcifications. There are bibasilar opacities, right greater than left. No large pleural effusion or visible pneumothorax. No acute osseous abnormality.  IMPRESSION: Endotracheal tube tip overlies the midthoracic trachea. Bibasilar opacities, right greater than left, which could be aspiration/infection. Electronically Signed   By: Maurine Simmering   On: 10/30/2020 13:18    ____________________________________________   PROCEDURES  Procedure(s) performed (including Critical Care):  Procedure Name: Intubation Date/Time: 11/08/2020 12:28 PM Performed by: Vanessa [place], MD Pre-anesthesia Checklist: Patient identified, Patient being monitored, Emergency Drugs available, Timeout performed and Suction available Oxygen Delivery Method: Non-rebreather mask Preoxygenation: Pre-oxygenation with 100% oxygen Induction Type: Rapid sequence Ventilation: Mask ventilation without difficulty Laryngoscope Size: Glidescope and 3 Grade View: Grade I Tube size: 7.5 mm Number of attempts:  1 Placement Confirmation: ETT inserted through vocal cords under direct vision, CO2 detector and Breath sounds checked- equal and bilateral Difficulty Due To: Difficulty was anticipated    .Critical Care  Date/Time: 11/13/2020 12:29 PM Performed by: Vanessa Hanahan, MD Authorized by: Vanessa Duffield, MD   Critical care provider statement:    Critical care time (minutes):  45   Critical care was necessary to treat or prevent imminent or life-threatening deterioration of the following conditions:  Respiratory failure   Critical care was time spent personally by me on the following activities:  Discussions with consultants, evaluation of patient's response to treatment, examination of patient, ordering and performing treatments and interventions, ordering and review of laboratory studies, ordering and review of radiographic studies, pulse oximetry, re-evaluation of patient's condition, obtaining history from patient or surrogate and review of old charts .1-3 Lead EKG Interpretation  Date/Time: 10/24/2020 12:29 PM Performed by: Vanessa [place], MD Authorized by: Vanessa [place], MD     Interpretation: abnormal     ECG rate:  100s   ECG rate assessment: tachycardic     Rhythm: sinus rhythm     Ectopy: none     Conduction: normal     ____________________________________________   INITIAL IMPRESSION / ASSESSMENT AND PLAN / ED COURSE   Saide Kataya Guimont was evaluated in Emergency Department on 10/21/2020 for the symptoms described in the history of present illness. She was evaluated in the context of the global COVID-19 pandemic, which necessitated consideration that the patient might be at risk for infection with the SARS-CoV-2 virus that causes COVID-19. Institutional protocols and algorithms that pertain to the evaluation of patients at risk for COVID-19 are in a state of rapid change based on information released by regulatory bodies including the CDC and federal and state organizations.  These policies and algorithms were followed during the patient's care in the ED.     Pt presents with SOB.  Given patient's history I suspect this is most likely aspiration given her coarse breath sounds bilaterally.  Lower suspicion for pneumothorax.  Will get x-ray to evaluate for pneumonia, pneumothorax, COVID swab.  Patient was altered unable to protect airway with oxygen levels in the 70s to 80s on a nonrebreather therefore patient was intubated.  Patient was noted to be hypotensive and given 1 L of fluid given no history of heart failure.  She met sepsis criteria therefore sepsis alert was called and started on broad-spectrum antibiotics.  Patient was started on fentanyl and Versed for sedation due to some low blood pressures.  Attempted to call [name] but no answer; Called son who states he is estranged from her and is not POA so not further information was given.   12:54 PM did call the facility who states that she has been pretty much bedbound since her last admission and that she  has not had any falls.  They found her in bed and coughing more therefore they had the nurse practitioner checked her oxygen levels and it was in the 70s.  They deny seeing that she had aspirated on anything.  Discussed with the ICU team for admission.  CT imaging was pending           ____________________________________________   FINAL CLINICAL IMPRESSION(S) / ED DIAGNOSES   Final diagnoses:  Acute respiratory failure with hypoxia (HCC)  Sepsis, due to unspecified organism, unspecified whether acute organ dysfunction present ([place])  Aspiration pneumonia of both lungs, unspecified aspiration pneumonia type, unspecified part of lung ([place])     MEDICATIONS GIVEN DURING THIS VISIT:  Medications  sodium chloride 0.9 % bolus 1,000 mL (has no administration in time range)  fentaNYL 2553mg in NS 2537m(1071mml) infusion-PREMIX (has no administration in time range)  midazolam (VERSED) 50 mg/50 mL  (1 mg/mL) premix infusion (has no administration in time range)  0.9 %  sodium chloride infusion (1,000 mLs Intravenous New Bag/Given 10/21/2020 1212)  etomidate (AMIDATE) injection (20 mg Intravenous Given 10/18/2020 1215)  rocuronium (ZEMURON) injection (100 mg Intravenous Given 10/30/2020 1215)     ED Discharge Orders     None        Note:  This document was prepared using Dragon voice recognition software and may include unintentional dictation errors.   FunVanessa DurhamD 10/25/2020 [phone]

## 2020-11-09 NOTE — Progress Notes (Signed)
eLink Physician-Brief Progress Note Patient Name: [name] DOB: 1945-12-10 MRN: [id number]   Date of Service  11/03/2020  HPI/Events of Note  Request to review abdominal film for gastric tube placement. Gastric tube tip and side port below the diaphragm in the stomach.  eICU Interventions  OK to use gastric tube.      Intervention Category Major Interventions: Other:  Lysle Dingwall 11/11/2020, 11:18 PM

## 2020-11-09 NOTE — Sepsis Progress Note (Signed)
Secure chat w/ MD Mannam about need for 3rd LA order. Order received immediatly.

## 2020-11-09 NOTE — H&P (Signed)
NAME:  [name], MRN:  [id number], DOB:  1946/05/15, LOS: 0 ADMISSION DATE:  11/16/2020, CONSULTATION DATE:  10/27/2020      REFERRING MD:  Marjean Donna MD, CHIEF COMPLAINT:  Acute resp failure  History of Present Illness:   75 year old with hypertension, hypothyroidism, severe bipolar and depression, diabetes Admitted with altered mental status, intubated for respiratory protection.  Being treated for sepsis likely from aspiration pneumonia  She had multiple recent admissions this year for DKA and was hospitalized in May of this year for aspiration of hotdog which required bronchoscopy and another admission in early June of this year for UTI urinary tract infection  Pertinent  Medical History    has a past medical history of Arthritis, Breast cancer ([place]), Cancer ([place]) (2004), Diabetes mellitus without complication ([place]), Hypercholesterolemia, Hypertension, Hypothyroid, and Seasonal allergies.   Significant Hospital Events: Including procedures, antibiotic start and stop dates in addition to other pertinent events   6/23-admit  Interim History / Subjective:    Objective   Blood pressure 107/79, pulse 94, temperature 99.7 F (37.6 C), resp. rate 19, height 5\' 3"  (1.6 m), weight 71.7 kg, SpO2 100 %.    Vent Mode: AC FiO2 (%):  [100 %] 100 % Set Rate:  [18 bmp] 18 bmp Vt Set:  [420 mL-480 mL] 420 mL PEEP:  [8 cmH20] 8 cmH20   Intake/Output Summary (Last 24 hours) at 11/07/2020 1409 Last data filed at 11/02/2020 1344 Gross per 24 hour  Intake 1100 ml  Output --  Net 1100 ml   Filed Weights   10/20/2020 1223  Weight: 71.7 kg    Examination: Blood pressure 107/79, pulse 94, temperature 99.7 F (37.6 C), resp. rate 19, height 5\' 3"  (1.6 m), weight 71.7 kg, SpO2 100 %. Gen:      No acute distress HEENT:  EOMI, sclera anicteric Neck:     No masses; no thyromegaly, ETT Lungs:    Clear to auscultation bilaterally; normal respiratory effort CV:         Regular rate and  rhythm; no murmurs Abd:      + bowel sounds; soft, non-tender; no palpable masses, no distension Ext:    No edema; adequate peripheral perfusion Skin:      Warm and dry; no rash Neuro: Sedated, unresponsive  Labs/imaging that I have personally reviewed  (right click and "Reselect all SmartList Selections" daily)   BUN/creatinine 50/1.03, lactic acid 2.2, BNP 69, troponin 13 WBC 14.4, hemoglobin 14.8, platelets 381 Chest x-ray today reviewed with bibasal opacities right greater than left  Resolved Hospital Problem list     Assessment & Plan:  Acute septic respiratory failure secondary to aspiration Severe sepsis present on admission likely secondary to aspiration pneumonia, possible UTI Continue vent support, follow ABG CTA of the chest is pending Continue broad antibiotic coverage Follow cultures  Metabolic encephalopathy secondary to sepsis Follow CT head Monitor neuro status  Bipolar disorder, depression Holding home psych meds  Diabetes mellitus with multiple recent admissions for DKA SSI coverage.  Hypothyroidism Continue Synthroid, check TSH  Sacral decub ulcer Previously noted, present on admission Wound care  Best Practice (right click and "Reselect all SmartList Selections" daily)   Diet/type: NPO Pain/Anxiety/Delirium protocol RASS goal -1 VAP protocol (if indicated): Yes DVT prophylaxis: prophylactic heparin  GI prophylaxis: PPI Glucose control:  SSI Central venous access:  N/A Arterial line:  N/A Foley:  N/A Mobility:  bed rest  PT consulted: N/A Studies pending: CT  Culture data pending:sputum,  urine , and blood Last reviewed culture data:today Antibiotics:cefepime, vanc, and flagyl  Antibiotic de-escalation: no,  continue current rx Stop date: to be determined  Code Status:  full code Last date of multidisciplinary goals of care discussion []  ccm prognosis: Life-threating Disposition: remains critically ill, will stay in intensive  care   Labs   CBC: Recent Labs  Lab 10/30/2020 1211  WBC 14.4*  NEUTROABS 10.5*  HGB 14.8  HCT 46.1*  MCV 94.1  PLT 622    Basic Metabolic Panel: Recent Labs  Lab 10/27/2020 1211  NA 145  K 4.2  CL 105  CO2 27  GLUCOSE 172*  BUN 50*  CREATININE 1.03*  CALCIUM 9.4   GFR: Estimated Creatinine Clearance: 44.8 mL/min (A) (by C-G formula based on SCr of 1.03 mg/dL (H)). Recent Labs  Lab 10/21/2020 1211  WBC 14.4*  LATICACIDVEN 2.2*    Liver Function Tests: Recent Labs  Lab 10/22/2020 1211  AST 20  ALT 12  ALKPHOS 51  BILITOT 0.7  PROT 7.2  ALBUMIN 2.4*   No results for input(s): LIPASE, AMYLASE in the last 168 hours. No results for input(s): AMMONIA in the last 168 hours.  ABG    Component Value Date/Time   PHART 7.50 (H) 10/25/2020 1228   PCO2ART 30 (L) 11/06/2020 1228   PO2ART 97 10/20/2020 1228   HCO3 23.4 11/01/2020 1228   O2SAT 98.1 10/26/2020 1228     Coagulation Profile: No results for input(s): INR, PROTIME in the last 168 hours.  Cardiac Enzymes: No results for input(s): CKTOTAL, CKMB, CKMBINDEX, TROPONINI in the last 168 hours.  HbA1C: Hemoglobin A1C  Date/Time Value Ref Range Status  03/26/2012 06:17 AM 6.7 (H) 4.2 - 6.3 % Final    Comment:    The American Diabetes Association recommends that a primary goal of therapy should be <7% and that physicians should reevaluate the treatment regimen in patients with HbA1c values consistently >8%.    Hgb A1c MFr Bld  Date/Time Value Ref Range Status  10/23/2020 03:54 PM 7.0 (H) 4.8 - 5.6 % Final    Comment:    (NOTE)         Prediabetes: 5.7 - 6.4         Diabetes: >6.4         Glycemic control for adults with diabetes: <7.0   07/24/2020 01:50 PM 14.2 (H) 4.8 - 5.6 % Final    Comment:    (NOTE) Pre diabetes:          5.7%-6.4%  Diabetes:              >6.4%  Glycemic control for   <7.0% adults with diabetes     CBG: Recent Labs  Lab 11/15/2020 1214  GLUCAP 179*    Review of  Systems:   Unable to obtain due to altered mental status  Past Medical History:  She,  has a past medical history of Arthritis, Breast cancer ([place]), Cancer ([place]) (2004), Diabetes mellitus without complication ([place]), Hypercholesterolemia, Hypertension, Hypothyroid, and Seasonal allergies.   Surgical History:   Past Surgical History:  Procedure Laterality Date   ABDOMINAL HYSTERECTOMY     BREAST BIOPSY Left    neg   BREAST EXCISIONAL BIOPSY Right 2004   breast ca and lumpectomy   BREAST LUMPECTOMY Right 2004   breast ca   CHOLECYSTECTOMY     COLONOSCOPY WITH PROPOFOL N/A 02/13/2017   Procedure: COLONOSCOPY WITH PROPOFOL;  Surgeon: Jonathon Bellows, MD;  Location: [facility];  Service: Gastroenterology;  Laterality: N/A;   KIDNEY STONE SURGERY       Social History:   reports that she has quit smoking. Her smoking use included cigarettes. She has never used smokeless tobacco. She reports that she does not drink alcohol and does not use drugs.   Family History:  Her family history is negative for Bladder Cancer and Kidney cancer.   Allergies Allergies  Allergen Reactions   Navane [Thiothixene] Other (See Comments)    Reaction:  Unknown   Penicillins Rash    Has patient had a PCN reaction causing immediate rash, facial/tongue/throat swelling, SOB or lightheadedness with hypotension: No Has patient had a PCN reaction causing severe rash involving mucus membranes or skin necrosis: No Has patient had a PCN reaction that required hospitalization: No Has patient had a PCN reaction occurring within the last 10 years: No If all of the above answers are "NO", then may proceed with Cephalosporin use.  Has patient had a PCN reaction causing immediate rash, facial/tongue/throat swelling, SOB or lightheadedness with hypotension: No Has patient had a PCN reaction causing severe rash involving mucus membranes or skin necrosis: No Has patient had a PCN reaction that required hospitalization:  No Has patient had a PCN reaction occurring within the last 10 years: No If all of the above answers are "NO", then may proceed with Cephalosporin use. Has patient had a PCN reaction causing immediate rash, facial/tongue/throat swelling, SOB or lightheadedness with hypotension: No Has patient had a PCN reaction causing severe rash involving mucus membranes or skin necrosis: No Has patient had a PCN reaction that required hospitalization: No Has patient had a PCN reaction occurring within the last 10 years: No If all of the above answers are "NO", then may proceed with Cephalosporin use.     Home Medications  Prior to Admission medications   Medication Sig Start Date End Date Taking? Authorizing Provider  acetaminophen (TYLENOL) 500 MG tablet Take 500 mg by mouth every 6 (six) hours as needed for mild pain.    [provider]  clonazePAM (KLONOPIN) 1 MG tablet Take 1 tablet (1 mg total) by mouth 2 (two) times daily. 10/27/20   Max Sane, MD  divalproex (DEPAKOTE ER) 500 MG 24 hr tablet Take 2 tablets (1,000 mg total) by mouth at bedtime. 01/11/20   Clapacs, Madie Reno, MD  FLUoxetine (PROZAC) 20 MG capsule Take 60 mg by mouth daily.    [provider]  levothyroxine (SYNTHROID) 88 MCG tablet Take 88 mcg by mouth daily before breakfast.    [provider]  linagliptin (TRADJENTA) 5 MG TABS tablet Take 5 mg by mouth daily.    [provider]  lurasidone (LATUDA) 20 MG TABS tablet Take 20 mg by mouth daily. 08/17/20   [provider]  metFORMIN (GLUCOPHAGE) 500 MG tablet Take 1 tablet (500 mg total) by mouth 2 (two) times daily with a meal. 10/27/20   Max Sane, MD  ondansetron (ZOFRAN-ODT) 4 MG disintegrating tablet Take 4 mg by mouth every 6 (six) hours as needed for nausea. 08/16/20   [provider]  rosuvastatin (CRESTOR) 40 MG tablet Take 1 tablet (40 mg total) by mouth daily. 01/11/20   Clapacs, Madie Reno, MD     Critical care time:    The  patient is critically ill with multiple organ system failure and requires high complexity decision making for assessment and support, frequent evaluation and titration of therapies, advanced monitoring, review of radiographic studies and interpretation of  complex data.   Critical Care Time devoted to patient care services, exclusive of separately billable procedures, described in this note is 45 minutes.   Marshell Garfinkel MD [facility] See Amion for pager  If no response to pager , please call [phone] until 7pm After 7:00 pm call Elink  [phone] 11/15/2020, 2:24 PM

## 2020-11-09 NOTE — ED Notes (Signed)
Xray contacted.

## 2020-11-09 NOTE — Sepsis Progress Note (Signed)
Elink monitoring code sepsis 

## 2020-11-09 NOTE — ED Triage Notes (Addendum)
Pt to ER via [facility] from [facility], pt found unresponsive by staff.   On EMS arrival room air sats 65%. Placed on 15L via NRB with sats increased to 86%. 500cc bolus NS given. Pt unresponsive on arrival. Flexion with painful stimuli.   Ems VS- HR 108, temp 98, bp 130/80.  RT/ Dr Jari Pigg at bedside to prepare for intubation.

## 2020-11-09 NOTE — ED Notes (Signed)
Md Mannam messaged about pt's o2 sats maintaining around 80-85% despite being on 100% fio2. See new orders to increase peep to 14. RT notified.

## 2020-11-09 NOTE — Progress Notes (Signed)
[facility] Progress Note Patient Name: [name] DOB: Jan 22, 1946 MRN: [id number]   Date of Service  10/25/2020  HPI/Events of Note  Lactic acid = 2.8 --> 5.4. Last LVEF 55% to 60%.   eICU Interventions  Plan: Bolus with 0.9 NaCl 1 liter IV over 1 hour now.  Continue to trend lactic acid.     Intervention Category Major Interventions: Acid-Base disturbance - evaluation and management  [name] 11/07/2020, 9:06 PM

## 2020-11-09 NOTE — Consult Note (Signed)
CODE SEPSIS - PHARMACY COMMUNICATION  **Broad Spectrum Antibiotics should be administered within 1 hour of Sepsis diagnosis**  Time Code Sepsis Called/Page Received: 1229  Antibiotics Ordered: Cefepime, Vancomycin, Metronidazole  Time of 1st antibiotic administration: 1301  Additional action taken by pharmacy: None  If necessary, Name of Provider/Nurse Contacted: Gas ,PharmD Clinical Pharmacist  11/12/2020  12:30 PM

## 2020-11-09 NOTE — ED Notes (Signed)
Multiple unsuccessful attempts to place OG and NG tube made. MD Jari Pigg notified.

## 2020-11-10 ENCOUNTER — Inpatient Hospital Stay: Payer: Medicare Other

## 2020-11-10 LAB — CBC
HCT: 39.7 % (ref 36.0–46.0)
Hemoglobin: 12.7 g/dL (ref 12.0–15.0)
MCH: 30.5 pg (ref 26.0–34.0)
MCHC: 32 g/dL (ref 30.0–36.0)
MCV: 95.2 fL (ref 80.0–100.0)
Platelets: 295 10*3/uL (ref 150–400)
RBC: 4.17 MIL/uL (ref 3.87–5.11)
RDW: 14.6 % (ref 11.5–15.5)
WBC: 10.5 10*3/uL (ref 4.0–10.5)
nRBC: 0 % (ref 0.0–0.2)

## 2020-11-10 LAB — PROCALCITONIN: Procalcitonin: 3.28 ng/mL

## 2020-11-10 LAB — BASIC METABOLIC PANEL
Anion gap: 8 (ref 5–15)
BUN: 38 mg/dL — ABNORMAL HIGH (ref 8–23)
CO2: 23 mmol/L (ref 22–32)
Calcium: 8.2 mg/dL — ABNORMAL LOW (ref 8.9–10.3)
Chloride: 113 mmol/L — ABNORMAL HIGH (ref 98–111)
Creatinine, Ser: 0.73 mg/dL (ref 0.44–1.00)
GFR, Estimated: >60 mL/min (ref >60)
Glucose, Bld: 97 mg/dL (ref 70–99)
Potassium: 3.4 mmol/L — ABNORMAL LOW (ref 3.5–5.1)
Sodium: 144 mmol/L (ref 135–145)

## 2020-11-10 LAB — STREP PNEUMONIAE URINARY ANTIGEN: Strep Pneumo Urinary Antigen: NEGATIVE

## 2020-11-10 LAB — LACTIC ACID, PLASMA: Lactic Acid, Venous: 2.5 mmol/L (ref 0.5–1.9)

## 2020-11-10 IMAGING — DX DG CHEST 1V PORT
2 series · 2 of 2 positions shown · non-contrast
Comparison: [DATE]

CLINICAL DATA: Respiratory failure

EXAM:
PORTABLE CHEST 1 VIEW

[chest ap (1 of 2)]
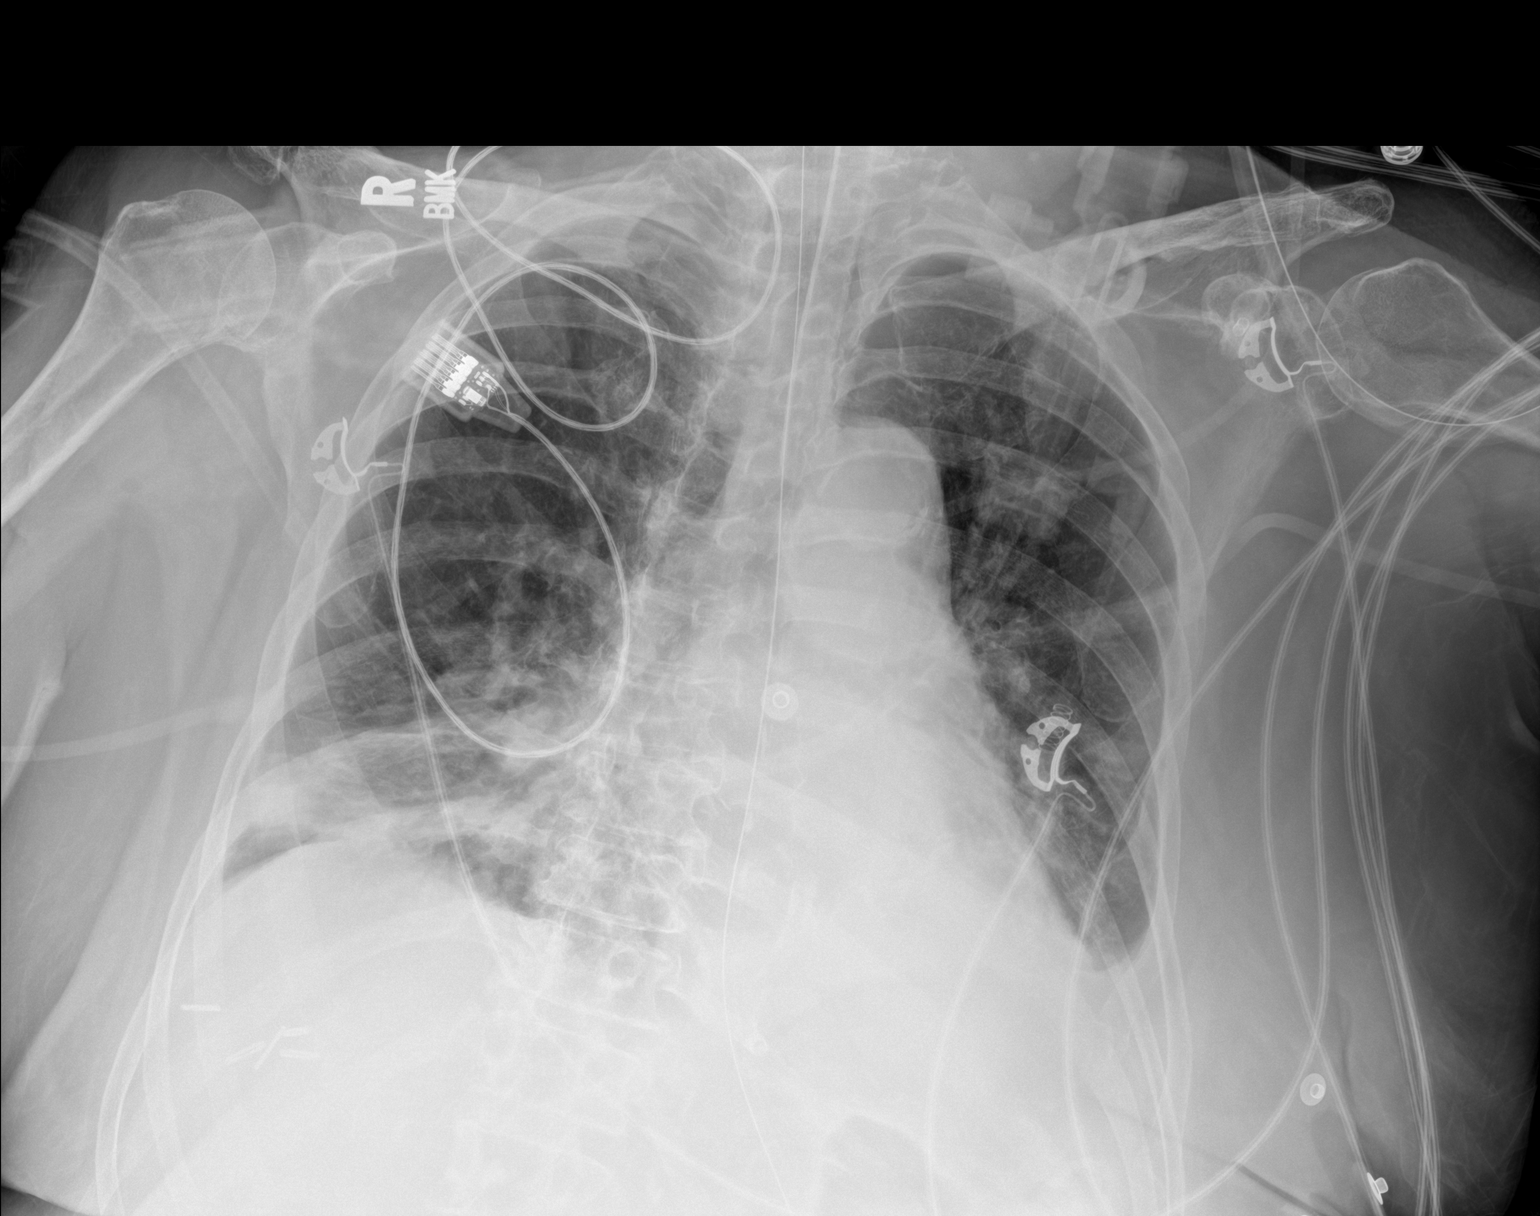

[chest ap (2 of 2)]
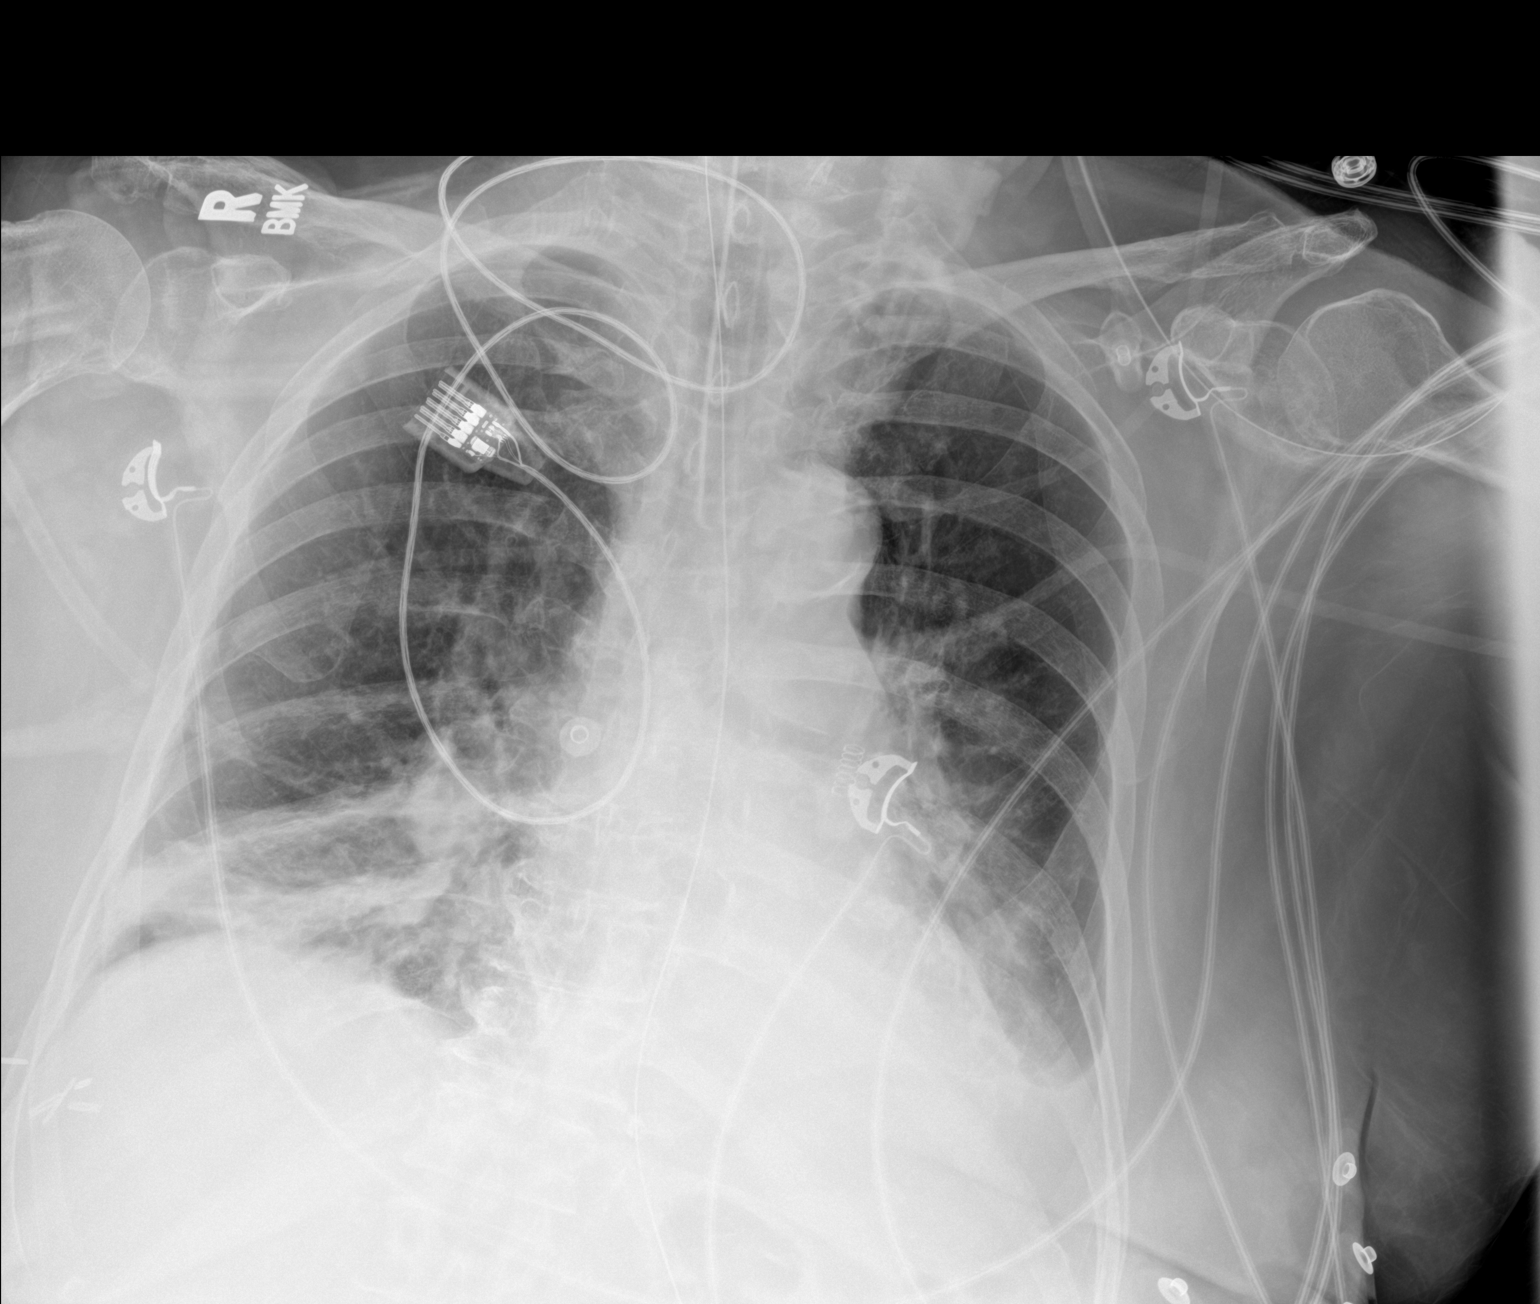

[2 of 2 positions shown; findings below may reference images not displayed]

FINDINGS: Endotracheal tube 3.9 cm above the carina. Nasogastric tube extends
into the upper abdomen beyond the margin of the examination.
Pulmonary insufflation has decreased in the interval. Focal
pulmonary infiltrate within the lung bases bilaterally has
progressed. Interval development of small left pleural effusion. No
pneumothorax. Cardiac size within normal limits.
IMPRESSION: Support tubes in appropriate position.

Progressive bibasilar pulmonary infiltrate. Interval development of
small left pleural effusion.

Slight interval decrease in pulmonary insufflation.

## 2020-11-10 MED ORDER — COLLAGENASE 250 UNIT/GM EX OINT
TOPICAL_OINTMENT | Freq: Every day | CUTANEOUS | Status: DC
  Administered 2020-11-10 – 2020-11-13 (×2): 1 via TOPICAL
  Filled 2020-11-10 (×2): qty 30

## 2020-11-10 MED ORDER — JUVEN PO PACK
1.0000 | PACK | Freq: Two times a day (BID) | ORAL | Status: DC
  Administered 2020-11-11 – 2020-11-19 (×17): 1

## 2020-11-10 MED ORDER — FREE WATER
30.0000 mL | Status: DC
  Administered 2020-11-10 – 2020-11-16 (×36): 30 mL

## 2020-11-10 MED ORDER — MIDODRINE HCL 5 MG PO TABS
10.0000 mg | ORAL_TABLET | Freq: Three times a day (TID) | ORAL | Status: DC
  Administered 2020-11-10 – 2020-11-19 (×27): 10 mg
  Filled 2020-11-10 (×27): qty 2

## 2020-11-10 MED ORDER — VANCOMYCIN HCL 1250 MG/250ML IV SOLN
1250.0000 mg | INTRAVENOUS | Status: DC
  Administered 2020-11-10 – 2020-11-12 (×3): 1250 mg via INTRAVENOUS
  Filled 2020-11-10 (×4): qty 250

## 2020-11-10 MED ORDER — VITAL AF 1.2 CAL PO LIQD
1000.0000 mL | ORAL | Status: DC
  Administered 2020-11-10 – 2020-11-16 (×5): 1000 mL

## 2020-11-10 MED ORDER — POTASSIUM CHLORIDE 20 MEQ PO PACK
40.0000 meq | PACK | Freq: Once | ORAL | Status: AC
  Administered 2020-11-10: 40 meq
  Filled 2020-11-10: qty 2

## 2020-11-10 NOTE — Progress Notes (Signed)
Called and updated pt's contact [name] of pt's condition and current plan of care.  All questions answered.  She is appreciative of update.   [name], AGACNP-BC [facility] Prefer epic messenger for cross cover needs If after hours, please call E-link

## 2020-11-10 NOTE — Progress Notes (Signed)
NAME:  [name], MRN:  [id number], DOB:  [birth year], LOS: 1 ADMISSION DATE:  11/14/2020, CONSULTATION DATE:  11/06/2020      REFERRING MD:  Marjean Donna MD, CHIEF COMPLAINT:  Acute resp failure  History of Present Illness:   75 year old with hypertension, hypothyroidism, severe bipolar and depression, diabetes Admitted with altered mental status, intubated for respiratory protection.  Being treated for sepsis likely from aspiration pneumonia  She had multiple recent admissions this year for DKA and was hospitalized in May of this year for aspiration of hotdog which required bronchoscopy and another admission in early June of this year for UTI urinary tract infection  Pertinent  Medical History    has a past medical history of Arthritis, Breast cancer ([place]), Cancer ([place]) (2004), Diabetes mellitus without complication ([place]), Hypercholesterolemia, Hypertension, Hypothyroid, and Seasonal allergies.   Micro Data:  10/26/2020: SARS-CoV-2 and influenza PCR>> negative 10/28/2020: Blood culture x2>> 11/08/2020: Urine>> 11/14/2020: Tracheal aspirate>> gram + cocci in pairs, gram + rods, gram - rods 11/08/2020: Strep pneumo urinary antigen>> negative  Antimicrobials:  Cefepime 6/23>> Flagyl 6/23>> Vancomycin 6/23>>  Significant Hospital Events: Including procedures, antibiotic start and stop dates in addition to other pertinent events   6/23- admit 6/24: Remains critically ill, on pressors (7 mcg Levophed); tracheal aspirate with gram-positive cocci in pairs, Gram + rods, Gram - rods, cultures and sensitivity still pending  Interim History / Subjective:  -No acute events reported overnight -Remains critically ill, on pressors (7 mcg Levophed); low grade fever -Tracheal aspirate with gram + cocci in pairs, Gram + rods, Gram - rods, cultures and sensitivity still pending  Objective   Blood pressure (!) 93/58, pulse 71, temperature (!) 100.94 F (38.3 C), resp. rate 18, height 5' 3"   (1.6 m), weight 69 kg, SpO2 98 %.    Vent Mode: PRVC FiO2 (%):  [45 %-100 %] 45 % Set Rate:  [18 bmp] 18 bmp Vt Set:  [420 mL-480 mL] 420 mL PEEP:  [5 cmH20-10 cmH20] 5 cmH20 Plateau Pressure:  [22 cmH20] 22 cmH20   Intake/Output Summary (Last 24 hours) at 11/10/2020 1222 Last data filed at 11/10/2020 1000 Gross per 24 hour  Intake 3830.84 ml  Output 825 ml  Net 3005.84 ml    Filed Weights   10/20/2020 1223 11/15/2020 1800  Weight: 71.7 kg 69 kg    Examination:  Gen:      Acutely ill-appearing female, laying in bed, intubated and sedated, no acute distress HEENT: Atraumatic, normocephalic, EOMI, sclera anicteric Neck:     Supple, no masses; no thyromegaly, ETT no JVD Lungs:    Coarse breath sounds bilaterally; synchronous with the vent, even, nonlabored CV:         Regular rate and rhythm (NSR on telemetry) ,no M/R/G Abd:      + bowel sounds; soft, non-tender; no palpable masses, no distension Ext:    No edema; adequate peripheral perfusion Skin:      Warm and dry; no rash Neuro:  Sedated, opens eyes to pain but does not follow commands, pupils PERRLA  Labs/imaging that I have personally reviewed  (right click and "Reselect all SmartList Selections" daily)   Labs 11/10/2020: Potassium 3.4, BUN 38, creatinine 0.73, lactic acid 2.5 (previously 5.4), procalcitonin 3.28, WBC 10.5  Chest x-ray 11/10/20: Support tubes in appropriate position. Progressive bibasilar pulmonary infiltrate. Interval development of small left pleural effusion. Slight interval decrease in pulmonary insufflation CT head 6/23:1. No acute intracranial findings. 2. Chronic microvascular ischemic change and  cerebral volume loss. CTA chest 6/23:1. No evidence of pulmonary embolism. 2. Dense bilateral lower lobe consolidation, combined with material in both endobronchial trees, highly suspicious for aspiration. Infection felt less likely. 3. Pulmonary artery enlargement suggests pulmonary  arterial hypertension. 4. Aortic Atherosclerosis (ICD10-I70.0) and Emphysema (ICD10-J43.9). Coronary artery atherosclerosis.  Resolved Hospital Problem list     Assessment & Plan:   Acute Hypoxic respiratory failure secondary to aspiration -Full vent support, implement lung protective strategies -Wean FiO2 and PEEP as tolerated maintain O2 sats greater than 92% -Follow intermittent chest x-ray and ABG as needed -Spontaneous breathing trials when respiratory parameters met and mental status permits -Implement VAP bundle -Continue antibiotics -As needed bronchodilators -CTA Chest 11/04/2020 negative for PE, with dense bilateral LL consolidation with combined material in both endobronchial trees concerning for aspiration  Septic Shock -Continuous cardiac monitoring -Maintain MAP greater than 65 -IV fluids -Vasopressors as needed to maintain MAP goal -Trend lactic acid -Consider Echocardiogram  Severe sepsis present on admission likely secondary to aspiration pneumonia, possible UTI -Monitor fever curve -Trend WBCs and procalcitonin -Follow cultures as above -Continue empiric cefepime, Flagyl, vancomycin for now pending cultures and sensitivities  Metabolic encephalopathy secondary to sepsis Sedation needs in setting of mechanical ventilation PMHx of Bipolar disorder, depression -Maintain RASS goal of 0 to -1 -Fentanyl with Versed as needed to maintain RASS goal -Avoid sedating meds as able -Daily wake-up assessment -CT head 6/23 negative -Urine drug screen positive for benzodiazepines -Holding home psych meds  Diabetes mellitus with multiple recent admissions for DKA -CBG's -SSI -Follow ICU Hypo/Hyperglycemia protocol  Hypothyroidism -Continue Synthroid -TSH elevated at 10.1, however free T4 normal at 0.87  Sacral decub ulcer -Previously noted, present on admission -Wound care consulted, appreciate input ~ will follow recommendations  Best Practice (right click and  "Reselect all SmartList Selections" daily)   Diet/type: NPO, start tube feeds Pain/Anxiety/Delirium protocol RASS goal -1 VAP protocol (if indicated): Yes DVT prophylaxis: Lovenox GI prophylaxis: H2B Glucose control:  SSI Central venous access:  N/A Arterial line:  N/A Foley:  yes, and still needed Mobility:  bed rest  PT consulted: N/A Studies pending: N/A Culture data pending:sputum, urine , and blood Last reviewed culture data:today Antibiotics:cefepime, vanc, and flagyl  Antibiotic de-escalation: no,  continue current rx Stop date: to be determined  Code Status:  full code Last date of multidisciplinary goals of care discussion [11/10/20] ccm prognosis: Life-threating Disposition: remains critically ill, will stay in intensive care   Labs   CBC: Recent Labs  Lab 11/07/2020 1211 11/10/20 0112  WBC 14.4* 10.5  NEUTROABS 10.5*  --   HGB 14.8 12.7  HCT 46.1* 39.7  MCV 94.1 95.2  PLT 381 295     Basic Metabolic Panel: Recent Labs  Lab 11/06/2020 1211 11/10/20 0112  NA 145 144  K 4.2 3.4*  CL 105 113*  CO2 27 23  GLUCOSE 172* 97  BUN 50* 38*  CREATININE 1.03* 0.73  CALCIUM 9.4 8.2*    GFR: Estimated Creatinine Clearance: 56.6 mL/min (by C-G formula based on SCr of 0.73 mg/dL). Recent Labs  Lab 10/28/2020 1211 11/11/2020 1518 10/28/2020 1842 11/10/20 0112  PROCALCITON  --  0.71  --  3.28  WBC 14.4*  --   --  10.5  LATICACIDVEN 2.2* 2.8* 5.4* 2.5*     Liver Function Tests: Recent Labs  Lab 10/22/2020 1211  AST 20  ALT 12  ALKPHOS 51  BILITOT 0.7  PROT 7.2  ALBUMIN 2.4*    No  results for input(s): LIPASE, AMYLASE in the last 168 hours. No results for input(s): AMMONIA in the last 168 hours.  ABG    Component Value Date/Time   PHART 7.50 (H) 11/11/2020 1228   PCO2ART 30 (L) 11/11/2020 1228   PO2ART 97 10/27/2020 1228   HCO3 23.4 11/14/2020 1228   O2SAT 98.1 10/27/2020 1228      Coagulation Profile: No results for input(s): INR, PROTIME in  the last 168 hours.  Cardiac Enzymes: No results for input(s): CKTOTAL, CKMB, CKMBINDEX, TROPONINI in the last 168 hours.  HbA1C: Hemoglobin A1C  Date/Time Value Ref Range Status  03/26/2012 06:17 AM 6.7 (H) 4.2 - 6.3 % Final    Comment:    The American Diabetes Association recommends that a primary goal of therapy should be <7% and that physicians should reevaluate the treatment regimen in patients with HbA1c values consistently >8%.    Hgb A1c MFr Bld  Date/Time Value Ref Range Status  10/23/2020 03:54 PM 7.0 (H) 4.8 - 5.6 % Final    Comment:    (NOTE)         Prediabetes: 5.7 - 6.4         Diabetes: >6.4         Glycemic control for adults with diabetes: <7.0   07/24/2020 01:50 PM 14.2 (H) 4.8 - 5.6 % Final    Comment:    (NOTE) Pre diabetes:          5.7%-6.4%  Diabetes:              >6.4%  Glycemic control for   <7.0% adults with diabetes     CBG: Recent Labs  Lab 11/08/2020 1214 11/14/2020 1737  GLUCAP 179* 160*     Review of Systems:   Unable to obtain due to intubation/sedation/critical illness  Past Medical History:  She,  has a past medical history of Arthritis, Breast cancer ([place]), Cancer ([place]) (2004), Diabetes mellitus without complication ([place]), Hypercholesterolemia, Hypertension, Hypothyroid, and Seasonal allergies.   Surgical History:   Past Surgical History:  Procedure Laterality Date   ABDOMINAL HYSTERECTOMY     BREAST BIOPSY Left    neg   BREAST EXCISIONAL BIOPSY Right 2004   breast ca and lumpectomy   BREAST LUMPECTOMY Right 2004   breast ca   CHOLECYSTECTOMY     COLONOSCOPY WITH PROPOFOL N/A 02/13/2017   Procedure: COLONOSCOPY WITH PROPOFOL;  Surgeon: Jonathon Bellows, MD;  Location: [facility] ENDOSCOPY;  Service: Gastroenterology;  Laterality: N/A;   KIDNEY STONE SURGERY       Social History:   reports that she has quit smoking. Her smoking use included cigarettes. She has never used smokeless tobacco. She reports that she does not drink  alcohol and does not use drugs.   Family History:  Her family history is negative for Bladder Cancer and Kidney cancer.   Allergies Allergies  Allergen Reactions   Navane [Thiothixene] Other (See Comments)    Reaction:  Unknown   Penicillins Rash    Has patient had a PCN reaction causing immediate rash, facial/tongue/throat swelling, SOB or lightheadedness with hypotension: No Has patient had a PCN reaction causing severe rash involving mucus membranes or skin necrosis: No Has patient had a PCN reaction that required hospitalization: No Has patient had a PCN reaction occurring within the last 10 years: No If all of the above answers are "NO", then may proceed with Cephalosporin use.  Has patient had a PCN reaction causing immediate rash, facial/tongue/throat swelling, SOB or  lightheadedness with hypotension: No Has patient had a PCN reaction causing severe rash involving mucus membranes or skin necrosis: No Has patient had a PCN reaction that required hospitalization: No Has patient had a PCN reaction occurring within the last 10 years: No If all of the above answers are "NO", then may proceed with Cephalosporin use. Has patient had a PCN reaction causing immediate rash, facial/tongue/throat swelling, SOB or lightheadedness with hypotension: No Has patient had a PCN reaction causing severe rash involving mucus membranes or skin necrosis: No Has patient had a PCN reaction that required hospitalization: No Has patient had a PCN reaction occurring within the last 10 years: No If all of the above answers are "NO", then may proceed with Cephalosporin use.     Home Medications  Prior to Admission medications   Medication Sig Start Date End Date Taking? Authorizing Provider  acetaminophen (TYLENOL) 500 MG tablet Take 500 mg by mouth every 6 (six) hours as needed for mild pain.    [provider]  clonazePAM (KLONOPIN) 1 MG tablet Take 1 tablet (1 mg total) by mouth 2 (two) times  daily. 10/27/20   Max Sane, MD  divalproex (DEPAKOTE ER) 500 MG 24 hr tablet Take 2 tablets (1,000 mg total) by mouth at bedtime. 01/11/20   Clapacs, Madie Reno, MD  FLUoxetine (PROZAC) 20 MG capsule Take 60 mg by mouth daily.    [provider]  levothyroxine (SYNTHROID) 88 MCG tablet Take 88 mcg by mouth daily before breakfast.    [provider]  linagliptin (TRADJENTA) 5 MG TABS tablet Take 5 mg by mouth daily.    [provider]  lurasidone (LATUDA) 20 MG TABS tablet Take 20 mg by mouth daily. 08/17/20   [provider]  metFORMIN (GLUCOPHAGE) 500 MG tablet Take 1 tablet (500 mg total) by mouth 2 (two) times daily with a meal. 10/27/20   Max Sane, MD  ondansetron (ZOFRAN-ODT) 4 MG disintegrating tablet Take 4 mg by mouth every 6 (six) hours as needed for nausea. 08/16/20   [provider]  rosuvastatin (CRESTOR) 40 MG tablet Take 1 tablet (40 mg total) by mouth daily. 01/11/20   Clapacs, Madie Reno, MD     Critical care time: 40 minutes   The patient is critically ill with multiple organ system failure and requires high complexity decision making for assessment and support, frequent evaluation and titration of therapies, advanced monitoring, review of radiographic studies and interpretation of complex data.   [name], AGACNP-BC  Pulmonary & Critical Care Prefer epic messenger for cross cover needs If after hours, please call E-link

## 2020-11-10 NOTE — Progress Notes (Signed)
Pt requiring pressure support with levophed, titration drip as needed. Temperature trending up prn medication parameters not met yet. See epic for further charting.   Handoff report given to primary, RN. Relinquishing care

## 2020-11-10 NOTE — Consult Note (Signed)
PHARMACY CONSULT NOTE - FOLLOW UP  Pharmacy Consult for Electrolyte Monitoring and Replacement   Recent Labs: Potassium (mmol/L)  Date Value  11/10/2020 3.4 (L)  02/25/2014 4.4   Magnesium (mg/dL)  Date Value  10/24/2020 1.8   Calcium (mg/dL)  Date Value  11/10/2020 8.2 (L)   Calcium, Total (mg/dL)  Date Value  02/25/2014 9.1   Albumin (g/dL)  Date Value  11/11/2020 2.4 (L)  02/25/2014 4.0   Phosphorus (mg/dL)  Date Value  09/19/2020 2.7   Sodium (mmol/L)  Date Value  11/10/2020 144  02/25/2014 139     Assessment: 75 yo female with HTN, hypothyroid, and diabetes admitted with likely aspiration PNA and AMS now intubated.  Pharmacy has been consulted to monitor electrolyte per E-link protocol.   K: 3.4  Goal of Therapy:  Electrolytes wnl's  Plan:  Replete with 63meq x1 - will follow electrolytes with am labs.  Lorna Dibble, PharmD Clinical Pharmacist 11/10/2020 1:34 PM

## 2020-11-10 NOTE — Consult Note (Signed)
Pharmacy Antibiotic Note  [name] is a 75 y.o. female admitted on 10/29/2020 with aspiration pneumonia.    Pharmacy has been consulted for Vancomycin/Cefepime dosing.  Plan: Will give vancomycin 1500mg  IV loading dose, followed by  Vancomycin 1250mg  IV Q48h> 1250mg  IV q24h Goal AUC 400-550. Expected AUC: 473>525 SCr used: 1.03 > 0.73 (rounded 0.8) Vd: 0.72 (BMI <30); dosing IBW  Will continue cefepime with 2g q12h Will continue metronidazole with 500mg  IV q8h  **6/24 - MRSA PCR negative; initial trach aspirate cultures with GPC/GPR/GNR - Per discussion with NP will continue current regimen pending speciation given septic picture.  Height: 5\' 3"  (160 cm) Weight: 69 kg (152 lb 1.9 oz) IBW/kg (Calculated) : 52.4  Temp (24hrs), Avg:100.6 F (38.1 C), Min:99.5 F (37.5 C), Max:101.12 F (38.4 C)  Recent Labs  Lab 11/08/2020 1211 11/10/2020 1518 10/19/2020 1842 11/10/20 0112  WBC 14.4*  --   --  10.5  CREATININE 1.03*  --   --  0.73  LATICACIDVEN 2.2* 2.8* 5.4* 2.5*     Estimated Creatinine Clearance: 56.6 mL/min (by C-G formula based on SCr of 0.73 mg/dL).    Allergies  Allergen Reactions   Navane [Thiothixene] Other (See Comments)    Reaction:  Unknown   Penicillins Rash    Has patient had a PCN reaction causing immediate rash, facial/tongue/throat swelling, SOB or lightheadedness with hypotension: No Has patient had a PCN reaction causing severe rash involving mucus membranes or skin necrosis: No Has patient had a PCN reaction that required hospitalization: No Has patient had a PCN reaction occurring within the last 10 years: No If all of the above answers are "NO", then may proceed with Cephalosporin use.  Has patient had a PCN reaction causing immediate rash, facial/tongue/throat swelling, SOB or lightheadedness with hypotension: No Has patient had a PCN reaction causing severe rash involving mucus membranes or skin necrosis: No Has patient had a PCN  reaction that required hospitalization: No Has patient had a PCN reaction occurring within the last 10 years: No If all of the above answers are "NO", then may proceed with Cephalosporin use. Has patient had a PCN reaction causing immediate rash, facial/tongue/throat swelling, SOB or lightheadedness with hypotension: No Has patient had a PCN reaction causing severe rash involving mucus membranes or skin necrosis: No Has patient had a PCN reaction that required hospitalization: No Has patient had a PCN reaction occurring within the last 10 years: No If all of the above answers are "NO", then may proceed with Cephalosporin use.    Antimicrobials this admission: Cefepime 6/23 >> Vancomycin 6/23 >> Metronidazole  Dose adjustments this admission: Will CTM as borderline for adjustment and significant improvement in creatinine since admit.  Microbiology results: 6/23 BCx: NGTD 6/23 UCx: NGTD  6/23 Resp Cx (Trach Aspirate): GPCs in pairs/ few GPRs/rare GNRs 6/23 MRSA PCR: negative  6/23 FLU/COVID NEG  Thank you for allowing pharmacy to be a part of this patient's care.  Lorna Dibble, PharmD, BCPS Clinical Pharmacist 11/10/2020 2:14 PM

## 2020-11-10 NOTE — Progress Notes (Addendum)
Initial Nutrition Assessment  DOCUMENTATION CODES:   Not applicable  INTERVENTION:   Vital 1.2 @55ml /hr- Initiate at 59ml/hr and increase by 5ml/hr q 8 hours until goal rate is reached.   Free water flushes 64ml q4 hours to maintain tube patency   Regimen provides 1584kcal/day, 99g/day protein and 1253ml/day free water   Pt at high refeed risk; recommend monitor potassium, magnesium and phosphorus labs daily until stable  Juven Fruit Punch BID via tube, each serving provides 95kcal and 2.5g of protein (amino acids glutamine and arginine)  NUTRITION DIAGNOSIS:   Inadequate oral intake related to inability to eat (pt sedated and ventilated) as evidenced by NPO status.  GOAL:   Provide needs based on ASPEN/SCCM guidelines  MONITOR:   Vent status, Labs, Weight trends, TF tolerance, Skin, I & O's  REASON FOR ASSESSMENT:   Ventilator    ASSESSMENT:   75 y/o female with h/o hypertension, hypothyroidism, breast ca s/p lumpectomy, severe bipolar, depression and diabetes who is admitted with altered mental status and found to have sepsis secondary to aspiration PNA and intubated for respiratory protection.  Pt sedated and ventilated. OGT in place; plan is to start tube feeds today. Per chart, pt down 28lbs(15%) over 6 weeks from April to mid May. It appears weight loss occurred during a hospitalization where pt was admitted for aspiration of a hot dog. Pt does appear weight stable since May.   Medications reviewed and include: lovenox, cefepime, pepcid, LRS @150ml /hr, metronidazole, levophed, vancomycin   Labs reviewed: K 3.4(L), BUN 38(H) Cbgs- 179, 160 x 24 hrs AIC 7.0(H)- 6/6  Patient is currently intubated on ventilator support MV: 8.5L/min Temp (24hrs), Avg:100.4 F (38 C), Min:98.9 F (37.2 C), Max:101.12 F (38.4 C)  Propofol: none   MAP- >52mmHg  UOP- 727ml/hr   NUTRITION - FOCUSED PHYSICAL EXAM:  Flowsheet Row Most Recent Value  Orbital Region No  depletion  Upper Arm Region Mild depletion  Thoracic and Lumbar Region No depletion  Buccal Region No depletion  Temple Region No depletion  Clavicle Bone Region No depletion  Clavicle and Acromion Bone Region No depletion  Scapular Bone Region No depletion  Dorsal Hand No depletion  Patellar Region Mild depletion  Anterior Thigh Region Mild depletion  Posterior Calf Region Mild depletion  Edema (RD Assessment) Mild  Hair Reviewed  Eyes Reviewed  Mouth Reviewed  Skin Reviewed  Nails Reviewed      Diet Order:   Diet Order             Diet NPO time specified  Diet effective now                  EDUCATION NEEDS:   No education needs have been identified at this time  Skin:  Skin Assessment: Reviewed RN Assessment (pressure injury heel, sacral ulcer)  Last BM:  pta  Height:   Ht Readings from Last 1 Encounters:  11/16/2020 5\' 3"  (1.6 m)    Weight:   Wt Readings from Last 1 Encounters:  10/23/2020 69 kg    Ideal Body Weight:  52.2 kg  BMI:  Body mass index is 26.95 kg/m.  Estimated Nutritional Needs:   Kcal:  1577kcal/day  Protein:  90-105g/day  Fluid:  1.3-1.6L/day  Koleen Distance MS, RD, LDN Please refer to [facility] for RD and/or RD on-call/weekend/after hours pager

## 2020-11-10 NOTE — Consult Note (Signed)
WOC Nurse Consult Note: Patient receiving care in [facility]. Consult completed via [place] and information from the primary RN, [employer] Reason for Consult: sacral and left heel wounds Wound type: DTPI to left heel and left medial first metatarsal head. Unstageable PI to sacrum Pressure Injury POA: Yes Measurement: To be provided by the bedside RN in the flowsheet section  Wound bed: left heel and left foot first are maroon in color. The sacral wound is blackened. Drainage (amount, consistency, odor) none Periwound: intact Dressing procedure/placement/frequency: I have ordered twice daily application of iodine to the left heel and left great toe area; and, daily application of santyl and saline moistened gauze to the sacrum. Also, use of bilateral prevalon heel lift boots. Monitor the wound area(s) for worsening of condition such as: Signs/symptoms of infection,  Increase in size,  Development of or worsening of odor, Development of pain, or increased pain at the affected locations.  Notify the medical team if any of these develop.  Thank you for the consult.  Discussed plan of care with the bedside nurse.  [place] nurse will not follow at this time.  Please re-consult the [place] team if needed.  Val Riles, RN, MSN, CWOCN, CNS-BC, pager [phone]

## 2020-11-11 DIAGNOSIS — E039 Hypothyroidism, unspecified: Secondary | ICD-10-CM

## 2020-11-11 DIAGNOSIS — J189 Pneumonia, unspecified organism: Secondary | ICD-10-CM

## 2020-11-11 DIAGNOSIS — E872 Acidosis: Secondary | ICD-10-CM

## 2020-11-11 DIAGNOSIS — A419 Sepsis, unspecified organism: Principal | ICD-10-CM

## 2020-11-11 DIAGNOSIS — R6521 Severe sepsis with septic shock: Secondary | ICD-10-CM

## 2020-11-11 LAB — LACTIC ACID, PLASMA: Lactic Acid, Venous: 1 mmol/L (ref 0.5–1.9)

## 2020-11-11 LAB — CBC
HCT: 38.1 % (ref 36.0–46.0)
Hemoglobin: 12.4 g/dL (ref 12.0–15.0)
MCH: 30.8 pg (ref 26.0–34.0)
MCHC: 32.5 g/dL (ref 30.0–36.0)
MCV: 94.8 fL (ref 80.0–100.0)
Platelets: 243 10*3/uL (ref 150–400)
RBC: 4.02 MIL/uL (ref 3.87–5.11)
RDW: 14.3 % (ref 11.5–15.5)
WBC: 7.6 10*3/uL (ref 4.0–10.5)
nRBC: 0 % (ref 0.0–0.2)

## 2020-11-11 LAB — BASIC METABOLIC PANEL
Anion gap: 5 (ref 5–15)
BUN: 22 mg/dL (ref 8–23)
CO2: 22 mmol/L (ref 22–32)
Calcium: 8.2 mg/dL — ABNORMAL LOW (ref 8.9–10.3)
Chloride: 114 mmol/L — ABNORMAL HIGH (ref 98–111)
Creatinine, Ser: 0.65 mg/dL (ref 0.44–1.00)
GFR, Estimated: >60 mL/min (ref >60)
Glucose, Bld: 181 mg/dL — ABNORMAL HIGH (ref 70–99)
Potassium: 3.5 mmol/L (ref 3.5–5.1)
Sodium: 141 mmol/L (ref 135–145)

## 2020-11-11 LAB — MAGNESIUM: Magnesium: 1.6 mg/dL — ABNORMAL LOW (ref 1.7–2.4)

## 2020-11-11 LAB — GLUCOSE, CAPILLARY
Glucose-Capillary: 184 mg/dL — ABNORMAL HIGH (ref 70–99)
Glucose-Capillary: 222 mg/dL — ABNORMAL HIGH (ref 70–99)
Glucose-Capillary: 229 mg/dL — ABNORMAL HIGH (ref 70–99)
Glucose-Capillary: 265 mg/dL — ABNORMAL HIGH (ref 70–99)

## 2020-11-11 LAB — PHOSPHORUS: Phosphorus: 1.8 mg/dL — ABNORMAL LOW (ref 2.5–4.6)

## 2020-11-11 LAB — URINE CULTURE

## 2020-11-11 LAB — PROCALCITONIN: Procalcitonin: 2.74 ng/mL

## 2020-11-11 MED ORDER — ACETAMINOPHEN 325 MG PO TABS
650.0000 mg | ORAL_TABLET | ORAL | Status: DC | PRN
  Administered 2020-11-12 – 2020-11-13 (×3): 650 mg
  Filled 2020-11-11 (×3): qty 2

## 2020-11-11 MED ORDER — K PHOS MONO-SOD PHOS DI & MONO 155-852-130 MG PO TABS
250.0000 mg | ORAL_TABLET | Freq: Two times a day (BID) | ORAL | Status: DC
  Administered 2020-11-11: 250 mg via ORAL
  Filled 2020-11-11: qty 1

## 2020-11-11 MED ORDER — LEVOTHYROXINE SODIUM 50 MCG PO TABS: 50.0000 ug | ORAL_TABLET | Freq: Every day | ORAL | Status: DC

## 2020-11-11 MED ORDER — MAGNESIUM SULFATE 2 GM/50ML IV SOLN
2.0000 g | Freq: Once | INTRAVENOUS | Status: AC
  Administered 2020-11-11: 2 g via INTRAVENOUS
  Filled 2020-11-11: qty 50

## 2020-11-11 MED ORDER — POTASSIUM & SODIUM PHOSPHATES 280-160-250 MG PO PACK: 1.0000 | PACK | Freq: Three times a day (TID) | ORAL | Status: DC

## 2020-11-11 MED ORDER — LEVOTHYROXINE SODIUM 50 MCG PO TABS
50.0000 ug | ORAL_TABLET | Freq: Every day | ORAL | Status: DC
  Administered 2020-11-12 – 2020-11-19 (×8): 50 ug
  Filled 2020-11-11 (×8): qty 1

## 2020-11-11 MED ORDER — POLYETHYLENE GLYCOL 3350 17 G PO PACK: 17.0000 g | PACK | Freq: Every day | ORAL | Status: DC | PRN

## 2020-11-11 MED ORDER — K PHOS MONO-SOD PHOS DI & MONO 155-852-130 MG PO TABS
250.0000 mg | ORAL_TABLET | Freq: Two times a day (BID) | ORAL | Status: AC
  Administered 2020-11-11: 250 mg
  Filled 2020-11-11: qty 1

## 2020-11-11 MED ORDER — ALBUMIN HUMAN 5 % IV SOLN
25.0000 g | Freq: Once | INTRAVENOUS | Status: AC
  Administered 2020-11-11: 25 g via INTRAVENOUS
  Filled 2020-11-11: qty 500

## 2020-11-11 MED ORDER — CHLORHEXIDINE GLUCONATE 0.12 % MT SOLN
OROMUCOSAL | Status: AC
  Administered 2020-11-11: 15 mL via OROMUCOSAL
  Filled 2020-11-11: qty 15

## 2020-11-11 NOTE — Consult Note (Signed)
PHARMACY CONSULT NOTE - FOLLOW UP  Pharmacy Consult for Electrolyte Monitoring and Replacement   Recent Labs: Potassium (mmol/L)  Date Value  11/11/2020 3.5  02/25/2014 4.4   Magnesium (mg/dL)  Date Value  11/11/2020 1.6 (L)   Calcium (mg/dL)  Date Value  11/11/2020 8.2 (L)   Calcium, Total (mg/dL)  Date Value  02/25/2014 9.1   Albumin (g/dL)  Date Value  11/16/2020 2.4 (L)  02/25/2014 4.0   Phosphorus (mg/dL)  Date Value  11/11/2020 1.8 (L)   Sodium (mmol/L)  Date Value  11/11/2020 141  02/25/2014 139     Assessment: 75 yo female with HTN, hypothyroid, and diabetes admitted with likely aspiration PNA and AMS now intubated.  Pharmacy has been consulted to monitor electrolytes.    Goal of Therapy:  Electrolytes wnl's  Plan:  K Phos Neutral and magnesium ordered by CCM Follow electrolytes as ordered by team  Tawnya Crook, PharmD Clinical Pharmacist 11/11/2020 10:56 AM

## 2020-11-11 NOTE — Progress Notes (Signed)
NAME:  [name], MRN:  [id number], DOB:  06-Oct-1945, LOS: 2 ADMISSION DATE:  11/06/2020, CONSULTATION DATE:  10/28/2020      REFERRING MD:  Marjean Donna MD, CHIEF COMPLAINT:  Acute resp failure  History of Present Illness:   75 year old with hypertension, hypothyroidism, severe bipolar and depression, diabetes Admitted with altered mental status, intubated for respiratory protection.  Being treated for sepsis likely from aspiration pneumonia  She had multiple recent admissions this year for DKA and was hospitalized in May of this year for aspiration of hotdog which required bronchoscopy and another admission in early June of this year for UTI urinary tract infection  Pertinent  Medical History    has a past medical history of Arthritis, Breast cancer ([place]), Cancer ([place]) (2004), Diabetes mellitus without complication ([place]), Hypercholesterolemia, Hypertension, Hypothyroid, and Seasonal allergies.   Micro Data:  11/01/2020: SARS-CoV-2 and influenza PCR>> negative 10/22/2020: Blood culture x2>> 11/08/2020: Urine>> 11/13/2020: Tracheal aspirate>> gram + cocci in pairs, gram + rods, gram - rods 10/27/2020: Strep pneumo urinary antigen>> negative  Antimicrobials:  Cefepime 6/23>> Flagyl 6/23>> Vancomycin 6/23>>  Significant Hospital Events: Including procedures, antibiotic start and stop dates in addition to other pertinent events   6/23- admit 6/24: Remains critically ill, on pressors (7 mcg Levophed); tracheal aspirate with gram-positive cocci in pairs, Gram + rods, Gram - rods, cultures and sensitivity still pending  Interim History / Subjective:  -No acute events reported overnight -Remains critically ill, on pressors (7 mcg Levophed); low grade fever -Tracheal aspirate with gram + cocci in pairs, Gram + rods, Gram - rods, cultures and sensitivity still pending  Objective   Blood pressure (!) 100/57, pulse 74, temperature 99.32 F (37.4 C), resp. rate 18, height 5' 3"  (1.6  m), weight 78 kg, SpO2 97 %.    Vent Mode: PRVC FiO2 (%):  [35 %-40 %] 35 % Set Rate:  [18 bmp] 18 bmp Vt Set:  [420 mL] 420 mL PEEP:  [5 cmH20] 5 cmH20 Plateau Pressure:  [12 cmH20-18 cmH20] 12 cmH20   Intake/Output Summary (Last 24 hours) at 11/11/2020 1221 Last data filed at 11/11/2020 1100 Gross per 24 hour  Intake 3295.5 ml  Output 650 ml  Net 2645.5 ml    Filed Weights   11/12/2020 1223 11/14/2020 1800 11/11/20 0500  Weight: 71.7 kg 69 kg 78 kg    Examination:  Gen:      Acutely ill-appearing female, laying in bed, intubated and sedated, no acute distress HEENT: Atraumatic, normocephalic, EOMI, sclera anicteric Neck:     Supple, no masses; no thyromegaly, ETT no JVD Lungs:    Coarse breath sounds bilaterally; synchronous with the vent, even, nonlabored CV:         Regular rate and rhythm (NSR on telemetry) ,no M/R/G Abd:      + bowel sounds; soft, non-tender; no palpable masses, no distension Ext:    No edema; adequate peripheral perfusion Skin:      Warm and dry; no rash Neuro:  Sedated, opens eyes to pain but does not follow commands, pupils PERRLA  Labs/imaging that I have personally reviewed  (right click and "Reselect all SmartList Selections" daily)   Labs 11/10/2020: Potassium 3.4, BUN 38, creatinine 0.73, lactic acid 2.5 (previously 5.4), procalcitonin 3.28, WBC 10.5  Chest x-ray 11/10/20: Support tubes in appropriate position. Progressive bibasilar pulmonary infiltrate. Interval development of small left pleural effusion. Slight interval decrease in pulmonary insufflation CT head 6/23:1. No acute intracranial findings. 2. Chronic microvascular ischemic  change and cerebral volume loss. CTA chest 6/23:1. No evidence of pulmonary embolism. 2. Dense bilateral lower lobe consolidation, combined with material in both endobronchial trees, highly suspicious for aspiration. Infection felt less likely. 3. Pulmonary artery enlargement suggests pulmonary  arterial hypertension. 4. Aortic Atherosclerosis (ICD10-I70.0) and Emphysema (ICD10-J43.9). Coronary artery atherosclerosis.  Resolved Hospital Problem list     Assessment & Plan:   Acute Hypoxic respiratory failure secondary to aspiration -Full vent support, implement lung protective strategies -Wean FiO2 and PEEP as tolerated maintain O2 sats greater than 92% -Follow intermittent chest x-ray and ABG as needed -Spontaneous breathing trials when respiratory parameters met and mental status permits -Implement VAP bundle -Continue antibiotics -As needed bronchodilators -CTA Chest 11/05/2020 negative for PE, with dense bilateral LL consolidation with combined material in both endobronchial trees concerning for aspiration. CXR worse 6/24, check echo, although BNP 68  Septic Shock -Continuous cardiac monitoring -Maintain MAP greater than 65 -Levophed continues at this time -IV fluids, consider Albumin 5% 500cc bolus and monitor pressor need -Trend lactic acid, peak 5.4 last night, down to 2.5 with volume by e-link -PCT 2.74, peak 3.2 -Leukocytosis regressing 14.4 peak to 7.6 -Follow cultures as above -Continue empiric cefepime, Flagyl, vancomycin for now pending cultures and sensitivities -Echo ordered given shock state  Metabolic encephalopathy secondary to sepsis Sedation needs in setting of mechanical ventilation PMHx of Bipolar disorder, depression -Maintain RASS goal of 0 to -1 -Fentanyl with Versed as needed to maintain RASS goal -Avoid sedating meds as able -Daily wake-up assessment -CT head 6/23 negative -Urine drug screen positive for benzodiazepines -Holding home psych meds  ENDO Diabetes mellitus with multiple recent admissions for DKA -CBG's -SSI -Follow ICU Hypo/Hyperglycemia protocol  Hypothyroidism -Resume Synthroid, seems hiatus from admit begin at 50 and titrate up  -TSH elevated at 10.1, however free T4 normal at 0.87  Sacral decub ulcer -Previously  noted, present on admission -Wound care consulted, appreciate input ~ will follow recommendations  Best Practice (right click and "Reselect all SmartList Selections" daily)   Diet/type: NPO, start tube feeds Pain/Anxiety/Delirium protocol RASS goal -1 VAP protocol (if indicated): Yes DVT prophylaxis: Lovenox GI prophylaxis: H2B Glucose control:  SSI Central venous access:  N/A Arterial line:  N/A Foley:  yes, and still needed Mobility:  bed rest  PT consulted: N/A Studies pending: N/A Culture data pending:sputum, urine , and blood Last reviewed culture data:today Antibiotics:cefepime, vanc, and flagyl  Antibiotic de-escalation: no,  continue current rx Stop date: to be determined  Code Status:  full code Last date of multidisciplinary goals of care discussion [11/10/20] ccm prognosis: Life-threating Disposition: remains critically ill, will stay in intensive care   Labs   CBC: Recent Labs  Lab 10/18/2020 1211 11/10/20 0112 11/11/20 0336  WBC 14.4* 10.5 7.6  NEUTROABS 10.5*  --   --   HGB 14.8 12.7 12.4  HCT 46.1* 39.7 38.1  MCV 94.1 95.2 94.8  PLT 381 295 243     Basic Metabolic Panel: Recent Labs  Lab 10/27/2020 1211 11/10/20 0112 11/11/20 0336  NA 145 144 141  K 4.2 3.4* 3.5  CL 105 113* 114*  CO2 27 23 22   GLUCOSE 172* 97 181*  BUN 50* 38* 22  CREATININE 1.03* 0.73 0.65  CALCIUM 9.4 8.2* 8.2*  MG  --   --  1.6*  PHOS  --   --  1.8*    GFR: Estimated Creatinine Clearance: 60 mL/min (by C-G formula based on SCr of 0.65 mg/dL). Recent Labs  Lab 11/13/2020 1211 10/25/2020 1518 11/15/2020 1842 11/10/20 0112 11/11/20 0336  PROCALCITON  --  0.71  --  3.28 2.74  WBC 14.4*  --   --  10.5 7.6  LATICACIDVEN 2.2* 2.8* 5.4* 2.5*  --      Liver Function Tests: Recent Labs  Lab 10/28/2020 1211  AST 20  ALT 12  ALKPHOS 51  BILITOT 0.7  PROT 7.2  ALBUMIN 2.4*    No results for input(s): LIPASE, AMYLASE in the last 168 hours. No results for input(s):  AMMONIA in the last 168 hours.  ABG    Component Value Date/Time   PHART 7.50 (H) 10/18/2020 1228   PCO2ART 30 (L) 11/14/2020 1228   PO2ART 97 10/29/2020 1228   HCO3 23.4 10/26/2020 1228   O2SAT 98.1 11/03/2020 1228      Coagulation Profile: No results for input(s): INR, PROTIME in the last 168 hours.  Cardiac Enzymes: No results for input(s): CKTOTAL, CKMB, CKMBINDEX, TROPONINI in the last 168 hours.  HbA1C: Hemoglobin A1C  Date/Time Value Ref Range Status  03/26/2012 06:17 AM 6.7 (H) 4.2 - 6.3 % Final    Comment:    The American Diabetes Association recommends that a primary goal of therapy should be <7% and that physicians should reevaluate the treatment regimen in patients with HbA1c values consistently >8%.    Hgb A1c MFr Bld  Date/Time Value Ref Range Status  10/23/2020 03:54 PM 7.0 (H) 4.8 - 5.6 % Final    Comment:    (NOTE)         Prediabetes: 5.7 - 6.4         Diabetes: >6.4         Glycemic control for adults with diabetes: <7.0   07/24/2020 01:50 PM 14.2 (H) 4.8 - 5.6 % Final    Comment:    (NOTE) Pre diabetes:          5.7%-6.4%  Diabetes:              >6.4%  Glycemic control for   <7.0% adults with diabetes     CBG: Recent Labs  Lab 10/24/2020 1214 11/08/2020 1737 11/11/20 0728  GLUCAP 179* 160* 184*     Review of Systems:   Unable to obtain due to intubation/sedation/critical illness  Past Medical History:  She,  has a past medical history of Arthritis, Breast cancer ([place]), Cancer ([place]) (2004), Diabetes mellitus without complication ([place]), Hypercholesterolemia, Hypertension, Hypothyroid, and Seasonal allergies.   Surgical History:   Past Surgical History:  Procedure Laterality Date   ABDOMINAL HYSTERECTOMY     BREAST BIOPSY Left    neg   BREAST EXCISIONAL BIOPSY Right 2004   breast ca and lumpectomy   BREAST LUMPECTOMY Right 2004   breast ca   CHOLECYSTECTOMY     COLONOSCOPY WITH PROPOFOL N/A 02/13/2017   Procedure: COLONOSCOPY  WITH PROPOFOL;  Surgeon: Jonathon Bellows, MD;  Location[facility] ENDOSCOPY;  Service: Gastroenterology;  Laterality: N/A;   KIDNEY STONE SURGERY       Social History:   reports that she has quit smoking. Her smoking use included cigarettes. She has never used smokeless tobacco. She reports that she does not drink alcohol and does not use drugs.   Family History:  Her family history is negative for Bladder Cancer and Kidney cancer.   Allergies Allergies  Allergen Reactions   Navane [Thiothixene] Other (See Comments)    Reaction:  Unknown   Penicillins Rash    Has patient had a  PCN reaction causing immediate rash, facial/tongue/throat swelling, SOB or lightheadedness with hypotension: No Has patient had a PCN reaction causing severe rash involving mucus membranes or skin necrosis: No Has patient had a PCN reaction that required hospitalization: No Has patient had a PCN reaction occurring within the last 10 years: No If all of the above answers are "NO", then may proceed with Cephalosporin use.  Has patient had a PCN reaction causing immediate rash, facial/tongue/throat swelling, SOB or lightheadedness with hypotension: No Has patient had a PCN reaction causing severe rash involving mucus membranes or skin necrosis: No Has patient had a PCN reaction that required hospitalization: No Has patient had a PCN reaction occurring within the last 10 years: No If all of the above answers are "NO", then may proceed with Cephalosporin use. Has patient had a PCN reaction causing immediate rash, facial/tongue/throat swelling, SOB or lightheadedness with hypotension: No Has patient had a PCN reaction causing severe rash involving mucus membranes or skin necrosis: No Has patient had a PCN reaction that required hospitalization: No Has patient had a PCN reaction occurring within the last 10 years: No If all of the above answers are "NO", then may proceed with Cephalosporin use.     Home Medications  Prior  to Admission medications   Medication Sig Start Date End Date Taking? Authorizing Provider  acetaminophen (TYLENOL) 500 MG tablet Take 500 mg by mouth every 6 (six) hours as needed for mild pain.    [provider]  clonazePAM (KLONOPIN) 1 MG tablet Take 1 tablet (1 mg total) by mouth 2 (two) times daily. 10/27/20   Max Sane, MD  divalproex (DEPAKOTE ER) 500 MG 24 hr tablet Take 2 tablets (1,000 mg total) by mouth at bedtime. 01/11/20   Clapacs, Madie Reno, MD  FLUoxetine (PROZAC) 20 MG capsule Take 60 mg by mouth daily.    [provider]  levothyroxine (SYNTHROID) 88 MCG tablet Take 88 mcg by mouth daily before breakfast.    [provider]  linagliptin (TRADJENTA) 5 MG TABS tablet Take 5 mg by mouth daily.    [provider]  lurasidone (LATUDA) 20 MG TABS tablet Take 20 mg by mouth daily. 08/17/20   [provider]  metFORMIN (GLUCOPHAGE) 500 MG tablet Take 1 tablet (500 mg total) by mouth 2 (two) times daily with a meal. 10/27/20   Max Sane, MD  ondansetron (ZOFRAN-ODT) 4 MG disintegrating tablet Take 4 mg by mouth every 6 (six) hours as needed for nausea. 08/16/20   [provider]  rosuvastatin (CRESTOR) 40 MG tablet Take 1 tablet (40 mg total) by mouth daily. 01/11/20   Clapacs, Madie Reno, MD     Critical care time: 35 minutes   The patient is critically ill with multiple organ system failure and requires high complexity decision making for assessment and support, frequent evaluation and titration of therapies, advanced monitoring, review of radiographic studies and interpretation of complex data.   I discussed  with/and updated  family (Del) today at 12:45p

## 2020-11-12 ENCOUNTER — Inpatient Hospital Stay: Payer: Self-pay

## 2020-11-12 ENCOUNTER — Inpatient Hospital Stay (HOSPITAL_COMMUNITY)
Admit: 2020-11-12 | Discharge: 2020-11-12 | Disposition: A | Discharge status: Home / Self Care | Payer: Medicare Other | Attending: Pulmonary Disease | Admitting: Pulmonary Disease

## 2020-11-12 DIAGNOSIS — I5021 Acute systolic (congestive) heart failure: Secondary | ICD-10-CM

## 2020-11-12 DIAGNOSIS — J690 Pneumonitis due to inhalation of food and vomit: Secondary | ICD-10-CM

## 2020-11-12 LAB — GLUCOSE, CAPILLARY
Glucose-Capillary: 181 mg/dL — ABNORMAL HIGH (ref 70–99)
Glucose-Capillary: 191 mg/dL — ABNORMAL HIGH (ref 70–99)
Glucose-Capillary: 198 mg/dL — ABNORMAL HIGH (ref 70–99)
Glucose-Capillary: 204 mg/dL — ABNORMAL HIGH (ref 70–99)
Glucose-Capillary: 262 mg/dL — ABNORMAL HIGH (ref 70–99)
Glucose-Capillary: 265 mg/dL — ABNORMAL HIGH (ref 70–99)
Glucose-Capillary: 295 mg/dL — ABNORMAL HIGH (ref 70–99)
Glucose-Capillary: 302 mg/dL — ABNORMAL HIGH (ref 70–99)
Glucose-Capillary: 337 mg/dL — ABNORMAL HIGH (ref 70–99)
Glucose-Capillary: 391 mg/dL — ABNORMAL HIGH (ref 70–99)

## 2020-11-12 LAB — ECHOCARDIOGRAM COMPLETE
AR max vel: 1.62 cm2
AV Peak grad: 13.7 mmHg
Ao pk vel: 1.85 m/s
Area-P 1/2: 2.82 cm2
Height: 63 in
S' Lateral: 2.7 cm
Weight: 2821.89 oz

## 2020-11-12 LAB — CBC
HCT: 34.4 % — ABNORMAL LOW (ref 36.0–46.0)
Hemoglobin: 11.4 g/dL — ABNORMAL LOW (ref 12.0–15.0)
MCH: 30.8 pg (ref 26.0–34.0)
MCHC: 33.1 g/dL (ref 30.0–36.0)
MCV: 93 fL (ref 80.0–100.0)
Platelets: 214 10*3/uL (ref 150–400)
RBC: 3.7 MIL/uL — ABNORMAL LOW (ref 3.87–5.11)
RDW: 14.1 % (ref 11.5–15.5)
WBC: 9.5 10*3/uL (ref 4.0–10.5)
nRBC: 0 % (ref 0.0–0.2)

## 2020-11-12 LAB — PHOSPHORUS
Phosphorus: 1.1 mg/dL — ABNORMAL LOW (ref 2.5–4.6)
Phosphorus: <1 mg/dL — CL (ref 2.5–4.6)

## 2020-11-12 LAB — BASIC METABOLIC PANEL
Anion gap: 6 (ref 5–15)
BUN: 19 mg/dL (ref 8–23)
CO2: 24 mmol/L (ref 22–32)
Calcium: 8.2 mg/dL — ABNORMAL LOW (ref 8.9–10.3)
Chloride: 110 mmol/L (ref 98–111)
Creatinine, Ser: 0.57 mg/dL (ref 0.44–1.00)
GFR, Estimated: >60 mL/min (ref >60)
Glucose, Bld: 336 mg/dL — ABNORMAL HIGH (ref 70–99)
Potassium: 2.9 mmol/L — ABNORMAL LOW (ref 3.5–5.1)
Sodium: 140 mmol/L (ref 135–145)

## 2020-11-12 LAB — CULTURE, RESPIRATORY W GRAM STAIN: Culture: NORMAL

## 2020-11-12 LAB — MAGNESIUM: Magnesium: 1.6 mg/dL — ABNORMAL LOW (ref 1.7–2.4)

## 2020-11-12 LAB — POTASSIUM: Potassium: 3.1 mmol/L — ABNORMAL LOW (ref 3.5–5.1)

## 2020-11-12 MED ORDER — SODIUM CHLORIDE 0.9% FLUSH: 10.0000 mL | INTRAVENOUS | Status: DC | PRN

## 2020-11-12 MED ORDER — SODIUM CHLORIDE 0.9% FLUSH
10.0000 mL | Freq: Two times a day (BID) | INTRAVENOUS | Status: DC
  Administered 2020-11-12 – 2020-11-19 (×14): 10 mL

## 2020-11-12 MED ORDER — DEXTROSE 50 % IV SOLN: 0.0000 mL | INTRAVENOUS | Status: DC | PRN

## 2020-11-12 MED ORDER — VECURONIUM BOLUS VIA INFUSION: 20.0000 mg | INTRAVENOUS | Status: DC

## 2020-11-12 MED ORDER — NOREPINEPHRINE 16 MG/250ML-% IV SOLN
0.0000 ug/min | INTRAVENOUS | Status: DC
  Administered 2020-11-12: 40 ug/min via INTRAVENOUS
  Administered 2020-11-14: 20 ug/min via INTRAVENOUS
  Administered 2020-11-14: 14 ug/min via INTRAVENOUS
  Filled 2020-11-12 (×5): qty 250

## 2020-11-12 MED ORDER — NOREPINEPHRINE BITARTRATE 1 MG/ML IV SOLN: 0.0000 ug/min | INTRAVENOUS | Status: DC

## 2020-11-12 MED ORDER — VECURONIUM BROMIDE 10 MG IV SOLR
20.0000 mg | INTRAVENOUS | Status: AC
  Administered 2020-11-12: 20 mg via INTRAVENOUS
  Filled 2020-11-12: qty 20

## 2020-11-12 MED ORDER — POTASSIUM PHOSPHATES 15 MMOLE/5ML IV SOLN
45.0000 mmol | Freq: Once | INTRAVENOUS | Status: AC
  Administered 2020-11-12: 45 mmol via INTRAVENOUS
  Filled 2020-11-12: qty 15

## 2020-11-12 MED ORDER — INSULIN ASPART 100 UNIT/ML IJ SOLN
0.0000 [IU] | INTRAMUSCULAR | Status: DC
  Administered 2020-11-12: 4 [IU] via SUBCUTANEOUS
  Administered 2020-11-12: 11 [IU] via SUBCUTANEOUS
  Administered 2020-11-12: 4 [IU] via SUBCUTANEOUS
  Administered 2020-11-13 (×2): 11 [IU] via SUBCUTANEOUS
  Administered 2020-11-13: 7 [IU] via SUBCUTANEOUS
  Administered 2020-11-13: 11 [IU] via SUBCUTANEOUS
  Administered 2020-11-13: 7 [IU] via SUBCUTANEOUS
  Administered 2020-11-14: 11 [IU] via SUBCUTANEOUS
  Administered 2020-11-14 (×3): 7 [IU] via SUBCUTANEOUS
  Administered 2020-11-14 (×2): 15 [IU] via SUBCUTANEOUS
  Administered 2020-11-15 (×3): 11 [IU] via SUBCUTANEOUS
  Administered 2020-11-15 (×2): 15 [IU] via SUBCUTANEOUS
  Administered 2020-11-15: 11 [IU] via SUBCUTANEOUS
  Administered 2020-11-16 (×2): 15 [IU] via SUBCUTANEOUS
  Administered 2020-11-16 (×2): 4 [IU] via SUBCUTANEOUS
  Administered 2020-11-16: 11 [IU] via SUBCUTANEOUS
  Administered 2020-11-16: 20 [IU] via SUBCUTANEOUS
  Administered 2020-11-16: 11 [IU] via SUBCUTANEOUS
  Administered 2020-11-17: 7 [IU] via SUBCUTANEOUS
  Administered 2020-11-17 (×3): 11 [IU] via SUBCUTANEOUS
  Administered 2020-11-17 – 2020-11-18 (×2): 7 [IU] via SUBCUTANEOUS
  Administered 2020-11-18 (×2): 11 [IU] via SUBCUTANEOUS
  Administered 2020-11-18: 4 [IU] via SUBCUTANEOUS
  Administered 2020-11-18: 7 [IU] via SUBCUTANEOUS
  Administered 2020-11-18: 11 [IU] via SUBCUTANEOUS
  Administered 2020-11-19: 3 [IU] via SUBCUTANEOUS
  Administered 2020-11-19: 11 [IU] via SUBCUTANEOUS
  Administered 2020-11-19: 4 [IU] via SUBCUTANEOUS
  Administered 2020-11-19: 7 [IU] via SUBCUTANEOUS
  Filled 2020-11-12 (×42): qty 1

## 2020-11-12 MED ORDER — MAGNESIUM SULFATE 4 GM/100ML IV SOLN
4.0000 g | Freq: Once | INTRAVENOUS | Status: AC
  Administered 2020-11-12: 4 g via INTRAVENOUS
  Filled 2020-11-12: qty 100

## 2020-11-12 MED ORDER — INSULIN ASPART 100 UNIT/ML IJ SOLN: 3.0000 [IU] | INTRAMUSCULAR | Status: DC

## 2020-11-12 MED ORDER — INSULIN GLARGINE 100 UNIT/ML ~~LOC~~ SOLN
10.0000 [IU] | SUBCUTANEOUS | Status: AC
  Administered 2020-11-12: 10 [IU] via SUBCUTANEOUS
  Filled 2020-11-12: qty 0.1

## 2020-11-12 MED ORDER — INSULIN REGULAR(HUMAN) IN NACL 100-0.9 UT/100ML-% IV SOLN
INTRAVENOUS | Status: DC
  Administered 2020-11-12: 17 [IU]/h via INTRAVENOUS
  Filled 2020-11-12: qty 100

## 2020-11-12 NOTE — Plan of Care (Signed)
Pt SB overnight, B/P labile titrating drips as appropriate, morning labs reported to provider, replacement ordered. Q2 turns, suctioning pt with thick secretions. Wound care done. Handoff report given to [place]. Relinquishing care.  Problem: Education: Goal: Knowledge of General Education information will improve Description: Including pain rating scale, medication(s)/side effects and non-pharmacologic comfort measures Outcome: Progressing   Problem: Health Behavior/Discharge Planning: Goal: Ability to manage health-related needs will improve Outcome: Progressing   Problem: Clinical Measurements: Goal: Ability to maintain clinical measurements within normal limits will improve Outcome: Progressing Goal: Will remain free from infection Outcome: Progressing Goal: Diagnostic test results will improve Outcome: Progressing Goal: Respiratory complications will improve Outcome: Progressing Goal: Cardiovascular complication will be avoided Outcome: Progressing   Problem: Activity: Goal: Risk for activity intolerance will decrease Outcome: Progressing   Problem: Nutrition: Goal: Adequate nutrition will be maintained Outcome: Progressing   Problem: Coping: Goal: Level of anxiety will decrease Outcome: Progressing   Problem: Elimination: Goal: Will not experience complications related to bowel motility Outcome: Progressing Goal: Will not experience complications related to urinary retention Outcome: Progressing   Problem: Pain Managment: Goal: General experience of comfort will improve Outcome: Progressing   Problem: Safety: Goal: Ability to remain free from injury will improve Outcome: Progressing   Problem: Skin Integrity: Goal: Risk for impaired skin integrity will decrease Outcome: Progressing

## 2020-11-12 NOTE — Progress Notes (Signed)
Attempted to contact son for consen for PICC placement.  No answer.  Dr Mortimer Fries notified by secure chat, states ok to place PICC per medical necessity.

## 2020-11-12 NOTE — Consult Note (Signed)
PHARMACY CONSULT NOTE - FOLLOW UP  Pharmacy Consult for Electrolyte Monitoring and Replacement   Recent Labs: Potassium (mmol/L)  Date Value  11/12/2020 2.9 (L)  02/25/2014 4.4   Magnesium (mg/dL)  Date Value  11/12/2020 1.6 (L)   Calcium (mg/dL)  Date Value  11/12/2020 8.2 (L)   Calcium, Total (mg/dL)  Date Value  02/25/2014 9.1   Albumin (g/dL)  Date Value  11/02/2020 2.4 (L)  02/25/2014 4.0   Phosphorus (mg/dL)  Date Value  11/12/2020 <1.0 (LL)   Sodium (mmol/L)  Date Value  11/12/2020 140  02/25/2014 139     Assessment: 75 yo female with HTN, hypothyroid, and diabetes admitted with likely aspiration PNA and AMS now intubated.  Pharmacy has been consulted to monitor electrolytes.    Goal of Therapy:  Electrolytes wnl's  Plan:  K Phos 45 mmol IV and magnesium 4 g IV ordered by CCM Follow electrolytes as ordered by team  Tawnya Crook, PharmD Clinical Pharmacist 11/12/2020 7:48 AM

## 2020-11-12 NOTE — Progress Notes (Signed)
Peripherally Inserted Central Catheter Placement  The IV Nurse has discussed with the patient and/or persons authorized to consent for the patient, the purpose of this procedure and the potential benefits and risks involved with this procedure.  The benefits include less needle sticks, lab draws from the catheter, and the patient may be discharged home with the catheter. Risks include, but not limited to, infection, bleeding, blood clot (thrombus formation), and puncture of an artery; nerve damage and irregular heartbeat and possibility to perform a PICC exchange if needed/ordered by physician.  Alternatives to this procedure were also discussed.  Bard Power PICC patient education guide, fact sheet on infection prevention and patient information card has been provided to patient /or left at bedside.   UNABLE TO OBTAIN CONSENT FROM SON,PT SEDATED.  PICC PLACED BY MEDICAL NECESSITY PER DR KASA. PICC Placement Documentation  PICC Triple Lumen 11/12/20 PICC Right Brachial 40 cm 0 cm (Active)  Indication for Insertion or Continuance of Line Vasoactive infusions 11/12/20 1644  Exposed Catheter (cm) 0 cm 11/12/20 1644  Site Assessment Clean;Dry;Intact 11/12/20 1644  Lumen #1 Status Flushed;Saline locked;Blood return noted 11/12/20 1644  Lumen #2 Status Flushed;Saline locked;Blood return noted 11/12/20 1644  Lumen #3 Status Flushed;Saline locked;Blood return noted 11/12/20 1644  Dressing Type Transparent 11/12/20 1644  Dressing Status Clean;Dry;Intact 11/12/20 1644  Antimicrobial disc in place? Yes 11/12/20 [place] Not Applicable 00/37/04 8889  Line Care Connections checked and tightened 11/12/20 1644  Line Adjustment (NICU/IV Team Only) No 11/12/20 1644  Dressing Intervention New dressing 11/12/20 1694  Dressing Change Due 11/19/20 11/12/20 [place]       Rolena Infante 11/12/2020, 4:45 PM

## 2020-11-12 NOTE — Progress Notes (Signed)
Patient remained on the ventilator. She was off sedation for a few hours for SBT but she failed weaning. Sedation restarted.

## 2020-11-12 NOTE — Progress Notes (Signed)
Carla RN notified PICC ready to use, to remove all PIV's since Triple lumen PICC placed is sufficient for IV needs, and to change all IV tubings before attaching to PICC line.

## 2020-11-12 NOTE — Progress Notes (Signed)
*  PRELIMINARY RESULTS* Echocardiogram 2D Echocardiogram has been performed.  Haiden Rawlinson C Carleigh Buccieri 11/12/2020, 1:25 PM

## 2020-11-12 NOTE — Progress Notes (Signed)
PATIENT NEEDS IV ACCESS WITH PICC LINE ASAP! UNABLE TO GET HOLD FAMILY

## 2020-11-12 NOTE — Progress Notes (Signed)
NAME:  [name], MRN:  [id number], DOB:  1945-09-04, LOS: 3 ADMISSION DATE:  11/15/2020, CONSULTATION DATE:  10/19/2020      REFERRING MD:  Marjean Donna MD, CHIEF COMPLAINT:  Acute resp failure  History of Present Illness:   75 year old with hypertension, hypothyroidism, severe bipolar and depression, diabetes Admitted with altered mental status, intubated for respiratory protection.  Being treated for sepsis likely from aspiration pneumonia  She had multiple recent admissions this year for DKA and was hospitalized in May of this year for aspiration of hotdog which required bronchoscopy and another admission in early June of this year for UTI urinary tract infection  Pertinent  Medical History    has a past medical history of Arthritis, Breast cancer ([place]), Cancer ([place]) (2004), Diabetes mellitus without complication ([place]), Hypercholesterolemia, Hypertension, Hypothyroid, and Seasonal allergies.   Micro Data:  10/31/2020: SARS-CoV-2 and influenza PCR>> negative 11/08/2020: Blood culture x2>> 11/08/2020: Urine>> 10/25/2020: Tracheal aspirate>> gram + cocci in pairs, gram + rods, gram - rods 11/06/2020: Strep pneumo urinary antigen>> negative  Antimicrobials:  Cefepime 6/23>> Flagyl 6/23>> Vancomycin 6/23>>  Significant Hospital Events: Including procedures, antibiotic start and stop dates in addition to other pertinent events   6/23- admit 6/24: Remains critically ill, on pressors (7 mcg Levophed); tracheal aspirate with gram-positive cocci in pairs, Gram + rods, Gram - rods, cultures and sensitivity still pending 6/26 remains on vent   Interim History / Subjective:  Remains critically ill On vent   Objective   Blood pressure (!) 88/44, pulse 60, temperature 99.14 F (37.3 C), resp. rate 18, height $RemoveBe'5\' 3"'bkuQmTmlE$  (1.6 m), weight 80 kg, SpO2 96 %.    Vent Mode: PRVC FiO2 (%):  [35 %] 35 % Set Rate:  [18 bmp] 18 bmp Vt Set:  [420 mL] 420 mL PEEP:  [5 cmH20] 5 cmH20 Plateau  Pressure:  [12 cmH20-22 cmH20] 22 cmH20   Intake/Output Summary (Last 24 hours) at 11/12/2020 0741 Last data filed at 11/12/2020 0700 Gross per 24 hour  Intake 3544.26 ml  Output 1405 ml  Net 2139.26 ml    Filed Weights   10/29/2020 1800 11/11/20 0500 11/12/20 0456  Weight: 69 kg 78 kg 80 kg    REVIEW OF SYSTEMS  PATIENT IS UNABLE TO PROVIDE COMPLETE REVIEW OF SYSTEMS DUE TO SEVERE CRITICAL ILLNESS AND TOXIC METABOLIC ENCEPHALOPATHY   PHYSICAL EXAMINATION:  GENERAL:critically ill appearing, +resp distress HEAD: Normocephalic, atraumatic.  EYES: Pupils equal, round, reactive to light.  No scleral icterus.  MOUTH: Moist mucosal membrane. NECK: Supple. No thyromegaly. No nodules. No JVD.  PULMONARY: +rhonchi, +wheezing CARDIOVASCULAR: S1 and S2. Regular rate and rhythm. No murmurs, rubs, or gallops.  GASTROINTESTINAL: Soft, nontender, -distended. Positive bowel sounds.  MUSCULOSKELETAL: No swelling, clubbing, or edema.  NEUROLOGIC: obtunded SKIN:intact,warm,dry    Labs/imaging that I have personally reviewed  (right click and "Reselect all SmartList Selections" daily)   Labs 11/10/2020: Potassium 3.4, BUN 38, creatinine 0.73, lactic acid 2.5 (previously 5.4), procalcitonin 3.28, WBC 10.5  Chest x-ray 11/10/20: Support tubes in appropriate position. Progressive bibasilar pulmonary infiltrate. Interval development of small left pleural effusion. Slight interval decrease in pulmonary insufflation CT head 6/23:1. No acute intracranial findings. 2. Chronic microvascular ischemic change and cerebral volume loss. CTA chest 6/23:1. No evidence of pulmonary embolism. 2. Dense bilateral lower lobe consolidation, combined with material in both endobronchial trees, highly suspicious for aspiration. Infection felt less likely. 3. Pulmonary artery enlargement suggests pulmonary arterial hypertension. 4. Aortic Atherosclerosis (ICD10-I70.0) and Emphysema (  ICD10-J43.9). Coronary artery  atherosclerosis.    Assessment & Plan:   Acute Hypoxic respiratory failure secondary to aspiration pneumonia Severe ACUTE Hypoxic and Hypercapnic Respiratory Failure -continue Mechanical Ventilator support -continue Bronchodilator Therapy -Wean Fio2 and PEEP as tolerated -VAP/VENT bundle implementation -will perform SAT/SBT when respiratory parameters are met -CTA Chest 11/03/2020 with dense bilateral LL consolidation with combined material in both endobronchial trees concerning for aspiration. CXR worse 6/24, check echo, although BNP 68   Septic shock -use vasopressors to keep MAP>65 as meeded -follow ABG and LA -follow up cultures -Continue empiric cefepime, Flagyl, vancomycin for now pending cultures and sensitivities -Echo ordered given shock state    NEUROLOGY ACUTE TOXIC METABOLIC ENCEPHALOPATHY -need for sedation -Goal RASS -2 to -3  ENDO - ICU hypoglycemic\Hyperglycemia protocol -check FSBS per protocol  Hypothyroidism -Resume Synthroid, seems hiatus from admit begin at 50 and titrate up  -TSH elevated at 10.1, however free T4 normal at 0.87  Sacral decub ulcer -Previously noted, present on admission -Wound care consulted, appreciate input ~ will follow recommendations  Best Practice (right click and "Reselect all SmartList Selections" daily)   Diet/type: NPO, start tube feeds Pain/Anxiety/Delirium protocol RASS goal -1 VAP protocol (if indicated): Yes DVT prophylaxis: Lovenox GI prophylaxis: H2B Glucose control:  SSI Central venous access:  N/A Arterial line:  N/A Foley:  yes, and still needed Mobility:  bed rest  PT consulted: N/A Studies pending: N/A Culture data pending:sputum, urine , and blood Last reviewed culture data:today Antibiotics:cefepime, vanc, and flagyl  Antibiotic de-escalation: no,  continue current rx Stop date: to be determined  Code Status:  full code Last date of multidisciplinary goals of care discussion [11/10/20] ccm  prognosis: Life-threating Disposition: remains critically ill, will stay in intensive care   Labs   CBC: Recent Labs  Lab 10/31/2020 1211 11/10/20 0112 11/11/20 0336 11/12/20 0531  WBC 14.4* 10.5 7.6 9.5  NEUTROABS 10.5*  --   --   --   HGB 14.8 12.7 12.4 11.4*  HCT 46.1* 39.7 38.1 34.4*  MCV 94.1 95.2 94.8 93.0  PLT 381 295 243 214     Basic Metabolic Panel: Recent Labs  Lab 10/23/2020 1211 11/10/20 0112 11/11/20 0336 11/12/20 0531  NA 145 144 141 140  K 4.2 3.4* 3.5 2.9*  CL 105 113* 114* 110  CO2 $Re'27 23 22 24  'wuI$ GLUCOSE 172* 97 181* 336*  BUN 50* 38* 22 19  CREATININE 1.03* 0.73 0.65 0.57  CALCIUM 9.4 8.2* 8.2* 8.2*  MG  --   --  1.6* 1.6*  PHOS  --   --  1.8* <1.0*    GFR: Estimated Creatinine Clearance: 60.8 mL/min (by C-G formula based on SCr of 0.57 mg/dL). Recent Labs  Lab 10/29/2020 1211 11/01/2020 1518 11/10/2020 1842 11/10/20 0112 11/11/20 0336 11/11/20 1348 11/12/20 0531  PROCALCITON  --  0.71  --  3.28 2.74  --   --   WBC 14.4*  --   --  10.5 7.6  --  9.5  LATICACIDVEN 2.2* 2.8* 5.4* 2.5*  --  1.0  --      Liver Function Tests: Recent Labs  Lab 11/12/2020 1211  AST 20  ALT 12  ALKPHOS 51  BILITOT 0.7  PROT 7.2  ALBUMIN 2.4*    No results for input(s): LIPASE, AMYLASE in the last 168 hours. No results for input(s): AMMONIA in the last 168 hours.  ABG    Component Value Date/Time   PHART 7.50 (H) 10/31/2020 1228  PCO2ART 30 (L) 10/24/2020 1228   PO2ART 97 10/28/2020 1228   HCO3 23.4 11/11/2020 1228   O2SAT 98.1 10/18/2020 1228      Coagulation Profile: No results for input(s): INR, PROTIME in the last 168 hours.  Cardiac Enzymes: No results for input(s): CKTOTAL, CKMB, CKMBINDEX, TROPONINI in the last 168 hours.  HbA1C: Hemoglobin A1C  Date/Time Value Ref Range Status  03/26/2012 06:17 AM 6.7 (H) 4.2 - 6.3 % Final    Comment:    The American Diabetes Association recommends that a primary goal of therapy should be <7% and that  physicians should reevaluate the treatment regimen in patients with HbA1c values consistently >8%.    Hgb A1c MFr Bld  Date/Time Value Ref Range Status  10/23/2020 03:54 PM 7.0 (H) 4.8 - 5.6 % Final    Comment:    (NOTE)         Prediabetes: 5.7 - 6.4         Diabetes: >6.4         Glycemic control for adults with diabetes: <7.0   07/24/2020 01:50 PM 14.2 (H) 4.8 - 5.6 % Final    Comment:    (NOTE) Pre diabetes:          5.7%-6.4%  Diabetes:              >6.4%  Glycemic control for   <7.0% adults with diabetes     CBG: Allergies Allergies  Allergen Reactions   Navane [Thiothixene] Other (See Comments)    Reaction:  Unknown   Penicillins Rash    Has patient had a PCN reaction causing immediate rash, facial/tongue/throat swelling, SOB or lightheadedness with hypotension: No Has patient had a PCN reaction causing severe rash involving mucus membranes or skin necrosis: No Has patient had a PCN reaction that required hospitalization: No Has patient had a PCN reaction occurring within the last 10 years: No If all of the above answers are "NO", then may proceed with Cephalosporin use.  Has patient had a PCN reaction causing immediate rash, facial/tongue/throat swelling, SOB or lightheadedness with hypotension: No Has patient had a PCN reaction causing severe rash involving mucus membranes or skin necrosis: No Has patient had a PCN reaction that required hospitalization: No Has patient had a PCN reaction occurring within the last 10 years: No If all of the above answers are "NO", then may proceed with Cephalosporin use. Has patient had a PCN reaction causing immediate rash, facial/tongue/throat swelling, SOB or lightheadedness with hypotension: No Has patient had a PCN reaction causing severe rash involving mucus membranes or skin necrosis: No Has patient had a PCN reaction that required hospitalization: No Has patient had a PCN reaction occurring within the last 10 years:  No If all of the above answers are "NO", then may proceed with Cephalosporin use.       DVT/GI PRX  assessed I Assessed the need for Labs I Assessed the need for Foley I Assessed the need for Central Venous Line Family Discussion when available I Assessed the need for Mobilization I made an Assessment of medications to be adjusted accordingly Safety Risk assessment completed  CASE DISCUSSED IN MULTIDISCIPLINARY ROUNDS WITH ICU TEAM     Critical Care Time devoted to patient care services described in this note is 45 minutes.  Critical care was necessary to treat /prevent imminent and life-threatening deterioration. Overall, patient is critically ill, prognosis is guarded.  Patient with Multiorgan failure and at high risk for cardiac arrest and death.  Corrin Parker, M.D.  [facility]  Medical Director [place] Director [facility] Cardio-Pulmonary Department

## 2020-11-12 NOTE — Progress Notes (Signed)
All drips and tubings changed.

## 2020-11-13 LAB — BASIC METABOLIC PANEL WITH GFR
Anion gap: 6 (ref 5–15)
BUN: 11 mg/dL (ref 8–23)
CO2: 26 mmol/L (ref 22–32)
Calcium: 7.9 mg/dL — ABNORMAL LOW (ref 8.9–10.3)
Chloride: 111 mmol/L (ref 98–111)
Creatinine, Ser: 0.62 mg/dL (ref 0.44–1.00)
GFR, Estimated: >60 mL/min (ref >60)
Glucose, Bld: 311 mg/dL — ABNORMAL HIGH (ref 70–99)
Potassium: 2.7 mmol/L — CL (ref 3.5–5.1)
Sodium: 143 mmol/L (ref 135–145)

## 2020-11-13 LAB — CBC WITH DIFFERENTIAL/PLATELET
Abs Immature Granulocytes: 0.55 10*3/uL — ABNORMAL HIGH (ref 0.00–0.07)
Basophils Absolute: 0.1 10*3/uL (ref 0.0–0.1)
Basophils Relative: 1 %
Eosinophils Absolute: 0.3 10*3/uL (ref 0.0–0.5)
Eosinophils Relative: 2 %
HCT: 37.4 % (ref 36.0–46.0)
Hemoglobin: 12.7 g/dL (ref 12.0–15.0)
Immature Granulocytes: 3 %
Lymphocytes Relative: 14 %
Lymphs Abs: 2.3 10*3/uL (ref 0.7–4.0)
MCH: 31.1 pg (ref 26.0–34.0)
MCHC: 34 g/dL (ref 30.0–36.0)
MCV: 91.4 fL (ref 80.0–100.0)
Monocytes Absolute: 2.3 10*3/uL — ABNORMAL HIGH (ref 0.1–1.0)
Monocytes Relative: 14 %
Neutro Abs: 11 10*3/uL — ABNORMAL HIGH (ref 1.7–7.7)
Neutrophils Relative %: 66 %
Platelets: 222 10*3/uL (ref 150–400)
RBC: 4.09 MIL/uL (ref 3.87–5.11)
RDW: 14.5 % (ref 11.5–15.5)
Smear Review: NORMAL
WBC: 16.5 10*3/uL — ABNORMAL HIGH (ref 4.0–10.5)
nRBC: 0.2 % (ref 0.0–0.2)

## 2020-11-13 LAB — MAGNESIUM: Magnesium: 2 mg/dL (ref 1.7–2.4)

## 2020-11-13 LAB — PHOSPHORUS: Phosphorus: 2.1 mg/dL — ABNORMAL LOW (ref 2.5–4.6)

## 2020-11-13 LAB — GLUCOSE, CAPILLARY
Glucose-Capillary: 201 mg/dL — ABNORMAL HIGH (ref 70–99)
Glucose-Capillary: 235 mg/dL — ABNORMAL HIGH (ref 70–99)
Glucose-Capillary: 248 mg/dL — ABNORMAL HIGH (ref 70–99)
Glucose-Capillary: 261 mg/dL — ABNORMAL HIGH (ref 70–99)
Glucose-Capillary: 263 mg/dL — ABNORMAL HIGH (ref 70–99)
Glucose-Capillary: 282 mg/dL — ABNORMAL HIGH (ref 70–99)

## 2020-11-13 MED ORDER — POTASSIUM CHLORIDE 10 MEQ/50ML IV SOLN
10.0000 meq | INTRAVENOUS | Status: AC
  Administered 2020-11-13 (×4): 10 meq via INTRAVENOUS
  Filled 2020-11-13 (×4): qty 50

## 2020-11-13 MED ORDER — POTASSIUM & SODIUM PHOSPHATES 280-160-250 MG PO PACK
2.0000 | PACK | Freq: Once | ORAL | Status: AC
  Administered 2020-11-13: 2
  Filled 2020-11-13: qty 2

## 2020-11-13 MED ORDER — IBUPROFEN 400 MG PO TABS
800.0000 mg | ORAL_TABLET | Freq: Three times a day (TID) | ORAL | Status: DC | PRN
  Administered 2020-11-14: 800 mg
  Filled 2020-11-13: qty 2

## 2020-11-13 MED ORDER — SODIUM CHLORIDE 0.9 % IV SOLN
1.0000 g | Freq: Three times a day (TID) | INTRAVENOUS | Status: DC
  Administered 2020-11-13 – 2020-11-15 (×6): 1 g via INTRAVENOUS
  Filled 2020-11-13 (×9): qty 1

## 2020-11-13 MED ORDER — IBUPROFEN 400 MG PO TABS
800.0000 mg | ORAL_TABLET | Freq: Three times a day (TID) | ORAL | Status: DC | PRN
  Administered 2020-11-13 (×2): 800 mg via ORAL
  Filled 2020-11-13 (×2): qty 2

## 2020-11-13 MED ORDER — DOCUSATE SODIUM 50 MG/5ML PO LIQD: 100.0000 mg | Freq: Two times a day (BID) | ORAL | Status: DC | PRN

## 2020-11-13 NOTE — Progress Notes (Signed)
GOALS OF CARE DISCUSSION  The Clinical status was relayed to family in detail. Son [name] who lives in [place], has not seen his mother for many years  Updated and notified of patients medical condition.   Patient remains unresponsive and will not open eyes to command.   Patient is having a weak cough and struggling to remove secretions.   Patient with increased WOB and using accessory muscles to breathe Explained to family course of therapy and the modalities  Failure to wean from vent Severe signs of aspiration    Patient with Progressive multiorgan failure with a very high probablity of a very minimal chance of meaningful recovery despite all aggressive and optimal medical therapy.    Family understands the situation.  HE  have consented and agreed to DNR/DNI and would NOT want to pursue trach and PEG tube Will consult Palliative care team  Family are satisfied with Plan of action and management. All questions answered  Additional CC time 35 mins   Kemani Demarais Patricia Pesa, M.D.  [facility]  Medical Director [place] Director [facility] Cardio-Pulmonary Department

## 2020-11-13 NOTE — Progress Notes (Signed)
Alternating tylenol and ibuprofen for fevers. Failed WUA, oxygen sats dropping to 87% and sustaining. Unable to follow commands. Placed back on sedation and FiO2 increased to 45%.

## 2020-11-13 NOTE — Progress Notes (Signed)
NAME:  [name], MRN:  [id number], DOB:  11/22/1945, LOS: 4 ADMISSION DATE:  11/10/2020  History of Present Illness:    75 year old with hypertension, hypothyroidism, severe bipolar and depression, diabetes Admitted with altered mental status, intubated for respiratory protection.  Being treated for sepsis likely from aspiration pneumonia   She had multiple recent admissions this year for DKA and was hospitalized in May of this year for aspiration of hotdog which required bronchoscopy and another admission in early June of this year for UTI urinary tract infection   Pertinent  Medical History     has a past medical history of Arthritis, Breast cancer ([place]), Cancer ([place]) (2004), Diabetes mellitus without complication ([place]), Hypercholesterolemia, Hypertension, Hypothyroid, and Seasonal allergies.    Micro Data:  11/01/2020: SARS-CoV-2 and influenza PCR>> negative 10/19/2020: Blood culture x2>> 11/08/2020: Urine>> 10/24/2020: Tracheal aspirate>> gram + cocci in pairs, gram + rods, gram - rods 11/12/2020: Strep pneumo urinary antigen>> negative   Antimicrobials:  Cefepime 6/23>> Flagyl 6/23>> Vancomycin 6/23>>    Antibiotics Given (last 72 hours)     Date/Time Action Medication Dose Rate   11/10/20 0737 New Bag/Given   metroNIDAZOLE (FLAGYL) IVPB 500 mg 500 mg 100 mL/hr   11/10/20 0858 New Bag/Given   ceFEPIme (MAXIPIME) 2 g in sodium chloride 0.9 % 100 mL IVPB 2 g 200 mL/hr   11/10/20 1438 New Bag/Given   metroNIDAZOLE (FLAGYL) IVPB 500 mg 500 mg 100 mL/hr   11/10/20 1549 New Bag/Given   vancomycin (VANCOREADY) IVPB 1250 mg/250 mL 1,250 mg 166.7 mL/hr   11/10/20 2157 New Bag/Given   ceFEPIme (MAXIPIME) 2 g in sodium chloride 0.9 % 100 mL IVPB 2 g 200 mL/hr   11/11/20 0005 New Bag/Given   metroNIDAZOLE (FLAGYL) IVPB 500 mg 500 mg 100 mL/hr   11/11/20 0852 New Bag/Given   metroNIDAZOLE (FLAGYL) IVPB 500 mg 500 mg 100 mL/hr   11/11/20 0955 New Bag/Given   ceFEPIme  (MAXIPIME) 2 g in sodium chloride 0.9 % 100 mL IVPB 2 g 200 mL/hr   11/11/20 1652 New Bag/Given   vancomycin (VANCOREADY) IVPB 1250 mg/250 mL 1,250 mg 166.7 mL/hr   11/11/20 1757 New Bag/Given   metroNIDAZOLE (FLAGYL) IVPB 500 mg 500 mg 100 mL/hr   11/11/20 2250 New Bag/Given   ceFEPIme (MAXIPIME) 2 g in sodium chloride 0.9 % 100 mL IVPB 2 g 200 mL/hr   11/12/20 0206 New Bag/Given   metroNIDAZOLE (FLAGYL) IVPB 500 mg 500 mg 100 mL/hr   11/12/20 7672 New Bag/Given   metroNIDAZOLE (FLAGYL) IVPB 500 mg 500 mg 100 mL/hr   11/12/20 0947 New Bag/Given   ceFEPIme (MAXIPIME) 2 g in sodium chloride 0.9 % 100 mL IVPB 2 g 200 mL/hr   11/12/20 1718 New Bag/Given   vancomycin (VANCOREADY) IVPB 1250 mg/250 mL 1,250 mg 166.7 mL/hr   11/12/20 1725 New Bag/Given   metroNIDAZOLE (FLAGYL) IVPB 500 mg 500 mg 100 mL/hr   11/12/20 2145 New Bag/Given   ceFEPIme (MAXIPIME) 2 g in sodium chloride 0.9 % 100 mL IVPB 2 g 200 mL/hr   11/13/20 0040 New Bag/Given   metroNIDAZOLE (FLAGYL) IVPB 500 mg 500 mg 100 mL/hr       Significant Hospital Events: Including procedures, antibiotic start and stop dates in addition to other pertinent events  6/23- admit 6/24: Remains critically ill, on pressors (7 mcg Levophed); tracheal aspirate with gram-positive cocci in pairs, Gram + rods, Gram - rods, cultures and sensitivity still pending 6/26 remains on vent  6/26 failed SAT.SBT, PICC LINE PLACED   Interim History / Subjective:  Remains on vent Failure to wean from vent Severe hypoxia        Objective   Blood pressure 135/71, pulse 69, temperature (!) 101.1 F (38.4 C), resp. rate 18, height 5' 3"  (1.6 m), weight 80 kg, SpO2 94 %.    Vent Mode: PRVC FiO2 (%):  [35 %] 35 % Set Rate:  [18 bmp] 18 bmp Vt Set:  [420 mL] 420 mL PEEP:  [5 cmH20] 5 cmH20 Pressure Support:  [5 cmH20] 5 cmH20 Plateau Pressure:  [17 cmH20-22 cmH20] 18 cmH20   Intake/Output Summary (Last 24 hours) at 11/13/2020 0711 Last data  filed at 11/13/2020 0600 Gross per 24 hour  Intake 3995.61 ml  Output 3475 ml  Net 520.61 ml   Filed Weights   11/14/2020 1800 11/11/20 0500 11/12/20 0456  Weight: 69 kg 78 kg 80 kg      REVIEW OF SYSTEMS  PATIENT IS UNABLE TO PROVIDE COMPLETE REVIEW OF SYSTEMS DUE TO SEVERE CRITICAL ILLNESS AND TOXIC METABOLIC ENCEPHALOPATHY   PHYSICAL EXAMINATION:  GENERAL:critically ill appearing, +resp distress HEAD: Normocephalic, atraumatic.  EYES: Pupils equal, round, reactive to light.  No scleral icterus.  MOUTH: Moist mucosal membrane. NECK: Supple. PULMONARY: +rhonchi, +wheezing CARDIOVASCULAR: S1 and S2. Regular rate and rhythm. No murmurs, rubs, or gallops.  GASTROINTESTINAL: Soft, nontender, -distended. Positive bowel sounds.  MUSCULOSKELETAL: No swelling, clubbing, or edema.  NEUROLOGIC: obtunded SKIN:intact,warm,dry   Labs/imaging that I havepersonally reviewed  (right click and "Reselect all SmartList Selections" daily)     ASSESSMENT AND PLAN SYNOPSIS  Acute Hypoxic respiratory failure secondary to aspiration pneumonia Severe ACUTE Hypoxic and Hypercapnic Respiratory Failure    Severe ACUTE Hypoxic and Hypercapnic Respiratory Failure -continue Mechanical Ventilator support -continue Bronchodilator Therapy -Wean Fio2 and PEEP as tolerated -VAP/VENT bundle implementation -will perform SAT/SBT when respiratory parameters are met  CARDIAC ICU monitoring   ACUTE KIDNEY INJURY/Renal Failure -continue Foley Catheter-assess need -Avoid nephrotoxic agents -Follow urine output, BMP -Ensure adequate renal perfusion, optimize oxygenation -Renal dose medications   NEUROLOGY Acute toxic metabolic encephalopathy, need for sedation Goal RASS -2 to -3   SEPTIC SHOCK -use vasopressors to keep MAP>65 as needed -follow ABG and LA -follow up cultures -emperic ABX  INFECTIOUS DISEASE -continue antibiotics as prescribed -follow up cultures   ENDO - ICU  hypoglycemic\Hyperglycemia protocol -check FSBS per protocol   GI GI PROPHYLAXIS as indicated  NUTRITIONAL STATUS DIET-->TF's as tolerated Constipation protocol as indicated   ELECTROLYTES -follow labs as needed -replace as needed -pharmacy consultation and following    Diet/type: tube feeds Pain/Anxiety/Delirium protocol RASS goal -1 VAP protocol (if indicated): Yes DVT prophylaxis: Lovenox GI prophylaxis: H2B Glucose control:  SSI Central venous access:  N/A Arterial line:  N/A Foley:  yes, and still needed Mobility:  bed rest  PT consulted: N/A Studies pending: N/A Culture data pending:sputum, urine , and blood Last reviewed culture data:today Antibiotics:cefepime, vanc, and flagyl  Antibiotic de-escalation: no,  continue current rx Stop date: to be determined  Code Status:  full code Last date of multidisciplinary goals of care discussion [11/10/20] ccm prognosis: Life-threating Disposition: remains critically ill, will stay in intensive care  Labs   CBC: Recent Labs  Lab 11/04/2020 1211 11/10/20 0112 11/11/20 0336 11/12/20 0531 11/13/20 0426  WBC 14.4* 10.5 7.6 9.5 16.5*  NEUTROABS 10.5*  --   --   --  PENDING  HGB 14.8 12.7 12.4 11.4* 12.7  HCT  46.1* 39.7 38.1 34.4* 37.4  MCV 94.1 95.2 94.8 93.0 91.4  PLT 381 295 243 214 294    Basic Metabolic Panel: Recent Labs  Lab 11/06/2020 1211 11/10/20 0112 11/11/20 0336 11/12/20 0531 11/13/20 0426  NA 145 144 141 140 143  K 4.2 3.4* 3.5 3.1*  2.9* 2.7*  CL 105 113* 114* 110 111  CO2 27 23 22 24 26   GLUCOSE 172* 97 181* 336* 311*  BUN 50* 38* 22 19 11   CREATININE 1.03* 0.73 0.65 0.57 0.62  CALCIUM 9.4 8.2* 8.2* 8.2* 7.9*  MG  --   --  1.6* 1.6* 2.0  PHOS  --   --  1.8* 1.1*  <1.0* 2.1*   GFR: Estimated Creatinine Clearance: 60.8 mL/min (by C-G formula based on SCr of 0.62 mg/dL). Recent Labs  Lab 11/13/2020 1518 11/12/2020 1842 11/10/20 0112 11/11/20 0336 11/11/20 1348 11/12/20 0531  11/13/20 0426  PROCALCITON 0.71  --  3.28 2.74  --   --   --   WBC  --   --  10.5 7.6  --  9.5 16.5*  LATICACIDVEN 2.8* 5.4* 2.5*  --  1.0  --   --     Liver Function Tests: Recent Labs  Lab 10/28/2020 1211  AST 20  ALT 12  ALKPHOS 51  BILITOT 0.7  PROT 7.2  ALBUMIN 2.4*   No results for input(s): LIPASE, AMYLASE in the last 168 hours. No results for input(s): AMMONIA in the last 168 hours.  ABG    Component Value Date/Time   PHART 7.50 (H) 11/02/2020 1228   PCO2ART 30 (L) 11/10/2020 1228   PO2ART 97 10/25/2020 1228   HCO3 23.4 10/28/2020 1228   O2SAT 98.1 10/28/2020 1228     Coagulation Profile: No results for input(s): INR, PROTIME in the last 168 hours.  Cardiac Enzymes: No results for input(s): CKTOTAL, CKMB, CKMBINDEX, TROPONINI in the last 168 hours.  HbA1C: Hemoglobin A1C  Date/Time Value Ref Range Status  03/26/2012 06:17 AM 6.7 (H) 4.2 - 6.3 % Final    Comment:    The American Diabetes Association recommends that a primary goal of therapy should be <7% and that physicians should reevaluate the treatment regimen in patients with HbA1c values consistently >8%.    Hgb A1c MFr Bld  Date/Time Value Ref Range Status  10/23/2020 03:54 PM 7.0 (H) 4.8 - 5.6 % Final    Comment:    (NOTE)         Prediabetes: 5.7 - 6.4         Diabetes: >6.4         Glycemic control for adults with diabetes: <7.0   07/24/2020 01:50 PM 14.2 (H) 4.8 - 5.6 % Final    Comment:    (NOTE) Pre diabetes:          5.7%-6.4%  Diabetes:              >6.4%  Glycemic control for   <7.0% adults with diabetes     CBG: Recent Labs  Lab 11/12/20 1602 11/12/20 1719 11/12/20 1940 11/12/20 2336 11/13/20 0405  GLUCAP 198* 191* 181* 262* 248*    Allergies Allergies  Allergen Reactions   Navane [Thiothixene] Other (See Comments)    Reaction:  Unknown   Penicillins Rash    Has patient had a PCN reaction causing immediate rash, facial/tongue/throat swelling, SOB or  lightheadedness with hypotension: No Has patient had a PCN reaction causing severe rash involving mucus membranes or skin  necrosis: No Has patient had a PCN reaction that required hospitalization: No Has patient had a PCN reaction occurring within the last 10 years: No If all of the above answers are "NO", then may proceed with Cephalosporin use.  Has patient had a PCN reaction causing immediate rash, facial/tongue/throat swelling, SOB or lightheadedness with hypotension: No Has patient had a PCN reaction causing severe rash involving mucus membranes or skin necrosis: No Has patient had a PCN reaction that required hospitalization: No Has patient had a PCN reaction occurring within the last 10 years: No If all of the above answers are "NO", then may proceed with Cephalosporin use. Has patient had a PCN reaction causing immediate rash, facial/tongue/throat swelling, SOB or lightheadedness with hypotension: No Has patient had a PCN reaction causing severe rash involving mucus membranes or skin necrosis: No Has patient had a PCN reaction that required hospitalization: No Has patient had a PCN reaction occurring within the last 10 years: No If all of the above answers are "NO", then may proceed with Cephalosporin use.       DVT/GI PRX  assessed I Assessed the need for Labs I Assessed the need for Foley I Assessed the need for Central Venous Line Family Discussion when available I Assessed the need for Mobilization I made an Assessment of medications to be adjusted accordingly Safety Risk assessment completed  CASE DISCUSSED IN MULTIDISCIPLINARY ROUNDS WITH ICU TEAM     Critical Care Time devoted to patient care services described in this note is 50 minutes.  Critical care was necessary to treat or prevent imminent or life-threatening deterioration. Overall, patient is critically ill, prognosis is guarded.  Patient with Multiorgan failure and at high risk for cardiac arrest and death.     Corrin Parker, M.D.  [facility]  Medical Director [place] Director [facility] Cardio-Pulmonary Department

## 2020-11-13 NOTE — Consult Note (Signed)
Contacted pharmacy regarding critical potassium of 2.7, awaiting orders/medication.

## 2020-11-13 NOTE — Consult Note (Signed)
[name] for Electrolyte Monitoring and Replacement   Recent Labs: Potassium (mmol/L)  Date Value  11/13/2020 2.7 (LL)  02/25/2014 4.4   Magnesium (mg/dL)  Date Value  11/13/2020 2.0   Calcium (mg/dL)  Date Value  11/13/2020 7.9 (L)   Calcium, Total (mg/dL)  Date Value  02/25/2014 9.1   Albumin (g/dL)  Date Value  10/20/2020 2.4 (L)  02/25/2014 4.0   Phosphorus (mg/dL)  Date Value  11/13/2020 2.1 (L)   Sodium (mmol/L)  Date Value  11/13/2020 143  02/25/2014 139     Assessment: 76 yo female with HTN, hypothyroid, severe bipolar/depression, and diabetes admitted with likely aspiration PNA and AMS now intubated and sedated. Pharmacy has been consulted to monitor electrolytes. Since admission renal function has been stable and at apparent baseline.   Corrected Calcium = 7.9 + (0.8 x [4 - 2.4]) = 9.18 mg/dL   Nutrition: Vital AF 1,000 mL @55  mL/hr  Juven 1 packet BID between meals  Free Water 30 mL q4h    Goal of Therapy:  Electrolytes wnl's   Plan:  IV Kcl 10 mEq x 4 2 packets Phos-Nak (contains 500 mg elemental phosphorous) FU electrolytes in morning   [name]

## 2020-11-14 ENCOUNTER — Inpatient Hospital Stay: Payer: Medicare Other

## 2020-11-14 LAB — CBC
HCT: 35.6 % — ABNORMAL LOW (ref 36.0–46.0)
Hemoglobin: 11.6 g/dL — ABNORMAL LOW (ref 12.0–15.0)
MCH: 30.5 pg (ref 26.0–34.0)
MCHC: 32.6 g/dL (ref 30.0–36.0)
MCV: 93.7 fL (ref 80.0–100.0)
Platelets: 182 10*3/uL (ref 150–400)
RBC: 3.8 MIL/uL — ABNORMAL LOW (ref 3.87–5.11)
RDW: 15 % (ref 11.5–15.5)
WBC: 17.9 10*3/uL — ABNORMAL HIGH (ref 4.0–10.5)
nRBC: 0.6 % — ABNORMAL HIGH (ref 0.0–0.2)

## 2020-11-14 LAB — CULTURE, BLOOD (ROUTINE X 2)
Culture: NO GROWTH
Culture: NO GROWTH

## 2020-11-14 LAB — BASIC METABOLIC PANEL
Anion gap: 6 (ref 5–15)
BUN: 24 mg/dL — ABNORMAL HIGH (ref 8–23)
CO2: 27 mmol/L (ref 22–32)
Calcium: 7.8 mg/dL — ABNORMAL LOW (ref 8.9–10.3)
Chloride: 112 mmol/L — ABNORMAL HIGH (ref 98–111)
Creatinine, Ser: 0.56 mg/dL (ref 0.44–1.00)
GFR, Estimated: >60 mL/min (ref >60)
Glucose, Bld: 304 mg/dL — ABNORMAL HIGH (ref 70–99)
Potassium: 3.3 mmol/L — ABNORMAL LOW (ref 3.5–5.1)
Sodium: 145 mmol/L (ref 135–145)

## 2020-11-14 LAB — POTASSIUM: Potassium: 3.7 mmol/L (ref 3.5–5.1)

## 2020-11-14 LAB — GLUCOSE, CAPILLARY
Glucose-Capillary: 223 mg/dL — ABNORMAL HIGH (ref 70–99)
Glucose-Capillary: 231 mg/dL — ABNORMAL HIGH (ref 70–99)
Glucose-Capillary: 261 mg/dL — ABNORMAL HIGH (ref 70–99)
Glucose-Capillary: 304 mg/dL — ABNORMAL HIGH (ref 70–99)
Glucose-Capillary: 317 mg/dL — ABNORMAL HIGH (ref 70–99)
Glucose-Capillary: 322 mg/dL — ABNORMAL HIGH (ref 70–99)
Glucose-Capillary: 342 mg/dL — ABNORMAL HIGH (ref 70–99)

## 2020-11-14 LAB — PHOSPHORUS
Phosphorus: 1.1 mg/dL — ABNORMAL LOW (ref 2.5–4.6)
Phosphorus: 2.1 mg/dL — ABNORMAL LOW (ref 2.5–4.6)

## 2020-11-14 LAB — MAGNESIUM: Magnesium: 1.9 mg/dL (ref 1.7–2.4)

## 2020-11-14 IMAGING — DX DG CHEST 1V PORT
1 series · 1 of 1 positions shown · non-contrast
Comparison: [DATE]

CLINICAL DATA: Acute respiratory failure

EXAM:
PORTABLE CHEST 1 VIEW

[chest ap]
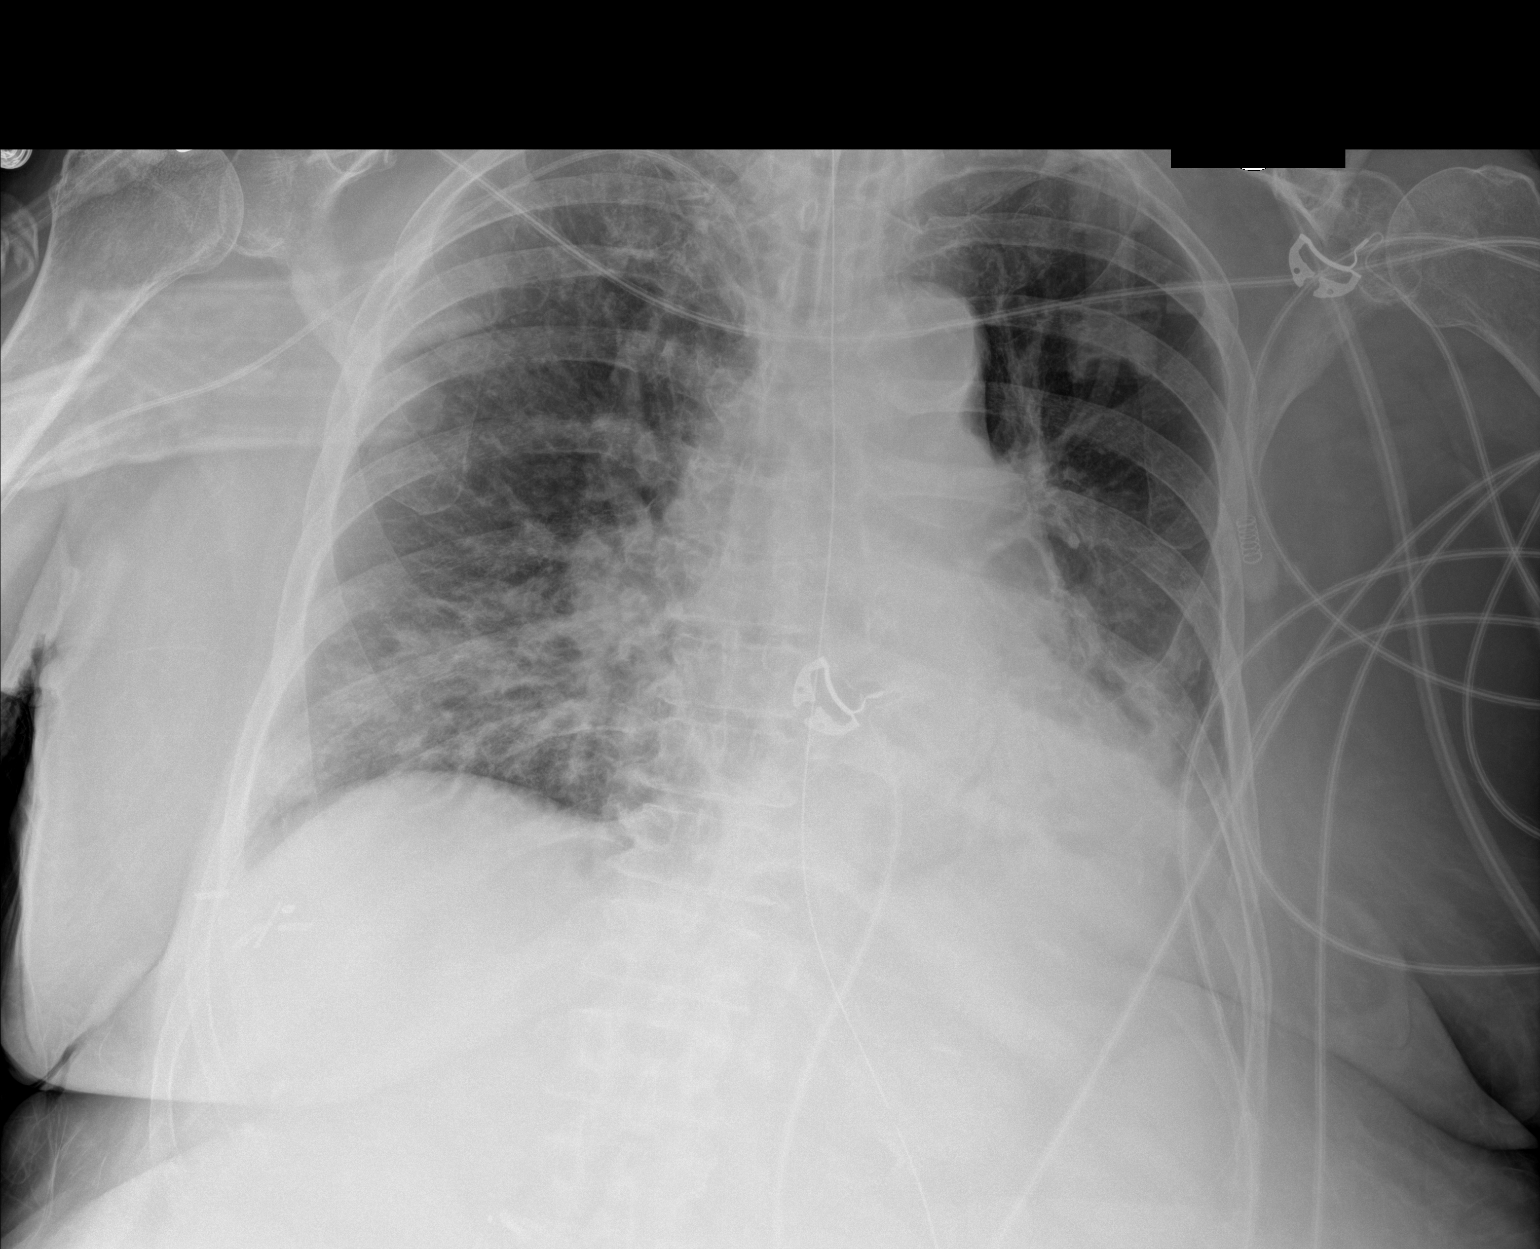

[1 of 1 positions shown; findings below may reference images not displayed]

FINDINGS: Endotracheal tube seen 3.2 cm above the carina. Nasogastric tube
extends into the upper abdomen beyond the margin of the examination.
Right upper extremity PICC line tip noted within the superior vena
cava.

Pulmonary insufflation is stable. Bibasilar consolidation is again
noted, stable. No pneumothorax. Small left pleural effusion. Cardiac
size is within normal limits. No acute bone abnormality.
IMPRESSION: Stable support lines and tubes.

Stable bibasilar consolidation. Associated small left pleural
effusion.

Stable pulmonary insufflation.

## 2020-11-14 MED ORDER — HYDROCORTISONE NA SUCCINATE PF 100 MG IJ SOLR
50.0000 mg | Freq: Four times a day (QID) | INTRAMUSCULAR | Status: DC
  Administered 2020-11-14 – 2020-11-19 (×20): 50 mg via INTRAVENOUS
  Filled 2020-11-14 (×20): qty 2

## 2020-11-14 MED ORDER — VASOPRESSIN 20 UNITS/100 ML INFUSION FOR SHOCK
0.0000 [IU]/min | INTRAVENOUS | Status: DC
  Administered 2020-11-14 – 2020-11-19 (×11): 0.03 [IU]/min via INTRAVENOUS
  Filled 2020-11-14 (×13): qty 100

## 2020-11-14 MED ORDER — POTASSIUM PHOSPHATES 15 MMOLE/5ML IV SOLN
15.0000 mmol | Freq: Once | INTRAVENOUS | Status: AC
  Administered 2020-11-14: 15 mmol via INTRAVENOUS
  Filled 2020-11-14: qty 5

## 2020-11-14 MED ORDER — INSULIN ASPART 100 UNIT/ML IJ SOLN
4.0000 [IU] | INTRAMUSCULAR | Status: DC
  Administered 2020-11-14 – 2020-11-15 (×5): 4 [IU] via SUBCUTANEOUS
  Filled 2020-11-14 (×5): qty 1

## 2020-11-14 MED ORDER — DOCUSATE SODIUM 50 MG/5ML PO LIQD
100.0000 mg | Freq: Two times a day (BID) | ORAL | Status: DC
  Administered 2020-11-14 – 2020-11-15 (×4): 100 mg
  Filled 2020-11-14 (×4): qty 10

## 2020-11-14 MED ORDER — POLYETHYLENE GLYCOL 3350 17 G PO PACK
17.0000 g | PACK | Freq: Every day | ORAL | Status: DC
  Administered 2020-11-14: 17 g
  Filled 2020-11-14: qty 1

## 2020-11-14 MED ORDER — POTASSIUM PHOSPHATES 15 MMOLE/5ML IV SOLN
30.0000 mmol | Freq: Once | INTRAVENOUS | Status: AC
  Administered 2020-11-14: 30 mmol via INTRAVENOUS
  Filled 2020-11-14: qty 10

## 2020-11-14 NOTE — Progress Notes (Signed)
Tmax 102.5, initiated cooling blanket and ice packs in axilla and groin. Will continue to monitor.

## 2020-11-14 NOTE — Progress Notes (Signed)
NAME:  [name], MRN:  [id number], DOB:  1945-10-26, LOS: 5 ADMISSION DATE:  11/07/2020  75 year old with hypertension, hypothyroidism, severe bipolar and depression, diabetes Admitted with altered mental status, intubated for respiratory protection.  Being treated for sepsis likely from aspiration pneumonia   She had multiple recent admissions this year for DKA and was hospitalized in May of this year for aspiration of hotdog which required bronchoscopy and another admission in early June of this year for UTI urinary tract infection   Pertinent  Medical History     has a past medical history of Arthritis, Breast cancer ([place]), Cancer ([place]) (2004), Diabetes mellitus without complication ([place]), Hypercholesterolemia, Hypertension, Hypothyroid, and Seasonal allergies.    Micro Data:  10/20/2020: SARS-CoV-2 and influenza PCR>> negative 11/12/2020: Blood culture x2>> 11/08/2020: Urine>> 10/19/2020: Tracheal aspirate>> gram + cocci in pairs, gram + rods, gram - rods 11/12/2020: Strep pneumo urinary antigen>> negative   Antimicrobials:  Cefepime 6/23>>6/27 Flagyl 6/23>>6/27 Vancomycin 6/23>>6/27  MERO 6/27 -->     Significant Hospital Events: Including procedures, antibiotic start and stop dates in addition to other pertinent events  6/23- admit 6/24: Remains critically ill, on pressors (7 mcg Levophed); tracheal aspirate with gram-positive cocci in pairs, Gram + rods, Gram - rods, cultures and sensitivity still pending 6/26 remains on vent 6/26 failed SAT.SBT, PICC LINE PLACED 6/27 remains on v ent 6/28 remains on vent, pressors     Interim History / Subjective:  Severe resp failure Severe hypoxia Remains critically ill On pressors         Objective   Blood pressure 137/85, pulse 77, temperature (!) 101.7 F (38.7 C), resp. rate 17, height 5\' 3"  (1.6 m), weight 80 kg, SpO2 92 %.    Vent Mode: PRVC FiO2 (%):  [35 %-60 %] 60 % Set Rate:  [18 bmp] 18 bmp Vt  Set:  [420 mL] 420 mL PEEP:  [5 cmH20] 5 cmH20 Plateau Pressure:  [16 cmH20-23 cmH20] 17 cmH20   Intake/Output Summary (Last 24 hours) at 11/14/2020 0740 Last data filed at 11/14/2020 0710 Gross per 24 hour  Intake 1818.36 ml  Output 2425 ml  Net -606.64 ml   Filed Weights   11/02/2020 1800 11/11/20 0500 11/12/20 0456  Weight: 69 kg 78 kg 80 kg      REVIEW OF SYSTEMS  PATIENT IS UNABLE TO PROVIDE COMPLETE REVIEW OF SYSTEMS DUE TO SEVERE CRITICAL ILLNESS AND TOXIC METABOLIC ENCEPHALOPATHY   PHYSICAL EXAMINATION:  GENERAL:critically ill appearing, +resp distress HEAD: Normocephalic, atraumatic.  EYES: Pupils equal, round, reactive to light.  No scleral icterus.  MOUTH: Moist mucosal membrane. NECK: Supple. PULMONARY: +rhonchi, +wheezing CARDIOVASCULAR: S1 and S2. Regular rate and rhythm. No murmurs, rubs, or gallops.  GASTROINTESTINAL: Soft, nontender, -distended. Positive bowel sounds.  MUSCULOSKELETAL: No swelling, clubbing, or edema.  NEUROLOGIC: obtunded SKIN:intact,warm,dry   Labs/imaging that I havepersonally reviewed  (right click and "Reselect all SmartList Selections" daily)      ASSESSMENT AND PLAN SYNOPSIS  Acute Hypoxic respiratory failure secondary to aspiration pneumonia Severe ACUTE Hypoxic and Hypercapnic Respiratory Failure    Severe ACUTE Hypoxic and Hypercapnic Respiratory Failure -continue Mechanical Ventilator support -continue Bronchodilator Therapy -Wean Fio2 and PEEP as tolerated -VAP/VENT bundle implementation Unable to wean from vent today   CARDIAC ICU monitoring   ACUTE KIDNEY INJURY/Renal Failure -continue Foley Catheter-assess need -Avoid nephrotoxic agents -Follow urine output, BMP -Ensure adequate renal perfusion, optimize oxygenation -Renal dose medications   NEUROLOGY Acute toxic metabolic encephalopathy, need for sedation  Goal RASS -2 to -3   SEPTIC SHOCK -use vasopressors to keep MAP>65 as needed -follow ABG  and LA -follow up cultures -emperic ABX -consider stress dose steroids -aggressive IV fluid resuscitation  INFECTIOUS DISEASE -continue antibiotics as prescribed -follow up cultures  ENDO - ICU hypoglycemic\Hyperglycemia protocol -check FSBS per protocol   GI GI PROPHYLAXIS as indicated  NUTRITIONAL STATUS DIET-->TF's as tolerated Constipation protocol as indicated   ELECTROLYTES -follow labs as needed -replace as needed -pharmacy consultation and following   Diet/type: tube feeds Pain/Anxiety/Delirium protocol RASS goal -1 VAP protocol (if indicated): Yes DVT prophylaxis: Lovenox GI prophylaxis: H2B Glucose control:  SSI Central venous access:  N/A Arterial line:  N/A Foley:  yes, and still needed Mobility:  bed rest  PT consulted: N/A Studies pending: N/A Culture data pending:sputum, urine , and blood Last reviewed culture data:today Antibiotics:cefepime, vanc, and flagyl Antibiotic de-escalation: no,  continue current rx Stop date: to be determined Code Status:  full code Last date of multidisciplinary goals of care discussion [11/10/20] ccm prognosis: Life-threating Disposition: remains critically ill, will stay in intensive care     Labs   CBC: Recent Labs  Lab 11/06/2020 1211 11/10/20 0112 11/11/20 0336 11/12/20 0531 11/13/20 0426 11/14/20 0542  WBC 14.4* 10.5 7.6 9.5 16.5* 17.9*  NEUTROABS 10.5*  --   --   --  11.0*  --   HGB 14.8 12.7 12.4 11.4* 12.7 11.6*  HCT 46.1* 39.7 38.1 34.4* 37.4 35.6*  MCV 94.1 95.2 94.8 93.0 91.4 93.7  PLT 381 295 243 214 222 448    Basic Metabolic Panel: Recent Labs  Lab 11/10/20 0112 11/11/20 0336 11/12/20 0531 11/13/20 0426 11/14/20 0542  NA 144 141 140 143 145  K 3.4* 3.5 3.1*  2.9* 2.7* 3.3*  CL 113* 114* 110 111 112*  CO2 23 22 24 26 27   GLUCOSE 97 181* 336* 311* 304*  BUN 38* 22 19 11  24*  CREATININE 0.73 0.65 0.57 0.62 0.56  CALCIUM 8.2* 8.2* 8.2* 7.9* 7.8*  MG  --  1.6* 1.6* 2.0 1.9  PHOS   --  1.8* 1.1*  <1.0* 2.1* 1.1*   GFR: Estimated Creatinine Clearance: 60.8 mL/min (by C-G formula based on SCr of 0.56 mg/dL). Recent Labs  Lab 10/22/2020 1518 11/08/2020 1842 11/10/20 0112 11/11/20 0336 11/11/20 1348 11/12/20 0531 11/13/20 0426 11/14/20 0542  PROCALCITON 0.71  --  3.28 2.74  --   --   --   --   WBC  --   --  10.5 7.6  --  9.5 16.5* 17.9*  LATICACIDVEN 2.8* 5.4* 2.5*  --  1.0  --   --   --     Liver Function Tests: Recent Labs  Lab 11/08/2020 1211  AST 20  ALT 12  ALKPHOS 51  BILITOT 0.7  PROT 7.2  ALBUMIN 2.4*   No results for input(s): LIPASE, AMYLASE in the last 168 hours. No results for input(s): AMMONIA in the last 168 hours.  ABG    Component Value Date/Time   PHART 7.50 (H) 10/31/2020 1228   PCO2ART 30 (L) 11/15/2020 1228   PO2ART 97 10/25/2020 1228   HCO3 23.4 11/16/2020 1228   O2SAT 98.1 11/03/2020 1228     Coagulation Profile: No results for input(s): INR, PROTIME in the last 168 hours.  Cardiac Enzymes: No results for input(s): CKTOTAL, CKMB, CKMBINDEX, TROPONINI in the last 168 hours.  HbA1C: Hemoglobin A1C  Date/Time Value Ref Range Status  03/26/2012 06:17 AM 6.7 (  H) 4.2 - 6.3 % Final    Comment:    The American Diabetes Association recommends that a primary goal of therapy should be <7% and that physicians should reevaluate the treatment regimen in patients with HbA1c values consistently >8%.    Hgb A1c MFr Bld  Date/Time Value Ref Range Status  10/23/2020 03:54 PM 7.0 (H) 4.8 - 5.6 % Final    Comment:    (NOTE)         Prediabetes: 5.7 - 6.4         Diabetes: >6.4         Glycemic control for adults with diabetes: <7.0   07/24/2020 01:50 PM 14.2 (H) 4.8 - 5.6 % Final    Comment:    (NOTE) Pre diabetes:          5.7%-6.4%  Diabetes:              >6.4%  Glycemic control for   <7.0% adults with diabetes     CBG: Recent Labs  Lab 11/13/20 1618 11/13/20 1919 11/13/20 2333 11/14/20 0437 11/14/20 0729   GLUCAP 263* 235* 201* 223* 231*    Allergies Allergies  Allergen Reactions   Navane [Thiothixene] Other (See Comments)    Reaction:  Unknown   Penicillins Rash    Has patient had a PCN reaction causing immediate rash, facial/tongue/throat swelling, SOB or lightheadedness with hypotension: No Has patient had a PCN reaction causing severe rash involving mucus membranes or skin necrosis: No Has patient had a PCN reaction that required hospitalization: No Has patient had a PCN reaction occurring within the last 10 years: No If all of the above answers are "NO", then may proceed with Cephalosporin use.  Has patient had a PCN reaction causing immediate rash, facial/tongue/throat swelling, SOB or lightheadedness with hypotension: No Has patient had a PCN reaction causing severe rash involving mucus membranes or skin necrosis: No Has patient had a PCN reaction that required hospitalization: No Has patient had a PCN reaction occurring within the last 10 years: No If all of the above answers are "NO", then may proceed with Cephalosporin use. Has patient had a PCN reaction causing immediate rash, facial/tongue/throat swelling, SOB or lightheadedness with hypotension: No Has patient had a PCN reaction causing severe rash involving mucus membranes or skin necrosis: No Has patient had a PCN reaction that required hospitalization: No Has patient had a PCN reaction occurring within the last 10 years: No If all of the above answers are "NO", then may proceed with Cephalosporin use.       DVT/GI PRX  assessed I Assessed the need for Labs I Assessed the need for Foley I Assessed the need for Central Venous Line Family Discussion when available I Assessed the need for Mobilization I made an Assessment of medications to be adjusted accordingly Safety Risk assessment completed  CASE DISCUSSED IN MULTIDISCIPLINARY ROUNDS WITH ICU TEAM     Critical Care Time devoted to patient care services  described in this note is 55 minutes.  Critical care was necessary to treat or prevent imminent or life-threatening deterioration. Overall, patient is critically ill, prognosis is guarded.  Patient with Multiorgan failure and at high risk for cardiac arrest and death.    Corrin Parker, M.D.  [facility]  Medical Director [place] Director [facility] Cardio-Pulmonary Department

## 2020-11-14 NOTE — Progress Notes (Signed)
Inpatient Diabetes Program Recommendations  AACE/ADA: New Consensus Statement on Inpatient Glycemic Control   Target Ranges:  Prepandial:   less than 140 mg/dL      Peak postprandial:   less than 180 mg/dL (1-2 hours)      Critically ill patients:  140 - 180 mg/dL   Results for LISE, PINCUS (MRN [id number]) as of 11/14/2020 08:01  Ref. Range 11/13/2020 07:14 11/13/2020 11:16 11/13/2020 16:18 11/13/2020 19:19 11/13/2020 23:33 11/14/2020 04:37 11/14/2020 07:29  Glucose-Capillary Latest Ref Range: 70 - 99 mg/dL 282 (H) 261 (H) 263 (H) 235 (H) 201 (H) 223 (H) 231 (H)    Review of Glycemic Control  Current orders for Inpatient glycemic control: Novolog 0-20 units Q4H; Vital @ 55 ml/hr  Inpatient Diabetes Program Recommendations:    Insulin:  May want to consider ordering Novolog 4 units Q4H for tube feeding coverage. If tube feeding is stopped or held then Novolog tube feeding coverage should also be stopped or held.  Thanks, Barnie Alderman, RN, MSN, CDE Diabetes Coordinator Inpatient Diabetes Program [phone] (Team Pager from 8am to 5pm)

## 2020-11-14 NOTE — Plan of Care (Signed)
Neuro: tolerating sedation well, responds to voice/pain  Resp: tolerating ventilator settings, thick secretions CV: TMAX 100.9, on cooling blanket, edema throughout GIGU: foley in place-increased urine output, BM x 1, tolerating feeds well Skin: sacral dressing changed, B LE in prafo boots Social: no contact with family.   Problem: Education: Goal: Knowledge of General Education information will improve Description: Including pain rating scale, medication(s)/side effects and non-pharmacologic comfort measures Outcome: Not Progressing   Problem: Health Behavior/Discharge Planning: Goal: Ability to manage health-related needs will improve Outcome: Not Progressing   Problem: Clinical Measurements: Goal: Ability to maintain clinical measurements within normal limits will improve Outcome: Not Progressing Goal: Will remain free from infection Outcome: Not Progressing Goal: Diagnostic test results will improve Outcome: Not Progressing Goal: Respiratory complications will improve Outcome: Not Progressing Goal: Cardiovascular complication will be avoided Outcome: Not Progressing   Problem: Activity: Goal: Risk for activity intolerance will decrease Outcome: Not Progressing   Problem: Nutrition: Goal: Adequate nutrition will be maintained Outcome: Not Progressing   Problem: Coping: Goal: Level of anxiety will decrease Outcome: Not Progressing   Problem: Elimination: Goal: Will not experience complications related to bowel motility Outcome: Not Progressing Goal: Will not experience complications related to urinary retention Outcome: Not Progressing   Problem: Pain Managment: Goal: General experience of comfort will improve Outcome: Not Progressing   Problem: Safety: Goal: Ability to remain free from injury will improve Outcome: Not Progressing   Problem: Skin Integrity: Goal: Risk for impaired skin integrity will decrease Outcome: Not Progressing

## 2020-11-14 NOTE — Consult Note (Signed)
[place] for Electrolyte Monitoring and Replacement   Recent Labs: Potassium (mmol/L)  Date Value  11/14/2020 3.3 (L)  02/25/2014 4.4   Magnesium (mg/dL)  Date Value  11/14/2020 1.9   Calcium (mg/dL)  Date Value  11/14/2020 7.8 (L)   Calcium, Total (mg/dL)  Date Value  02/25/2014 9.1   Albumin (g/dL)  Date Value  11/08/2020 2.4 (L)  02/25/2014 4.0   Phosphorus (mg/dL)  Date Value  11/14/2020 1.1 (L)   Sodium (mmol/L)  Date Value  11/14/2020 145  02/25/2014 139     Assessment: 75 yo female with HTN, hypothyroid, severe bipolar/depression, and diabetes admitted with likely aspiration PNA and AMS now intubated and sedated. Pharmacy has been consulted to monitor electrolytes. Since admission renal function has been stable and at apparent baseline.   Nutrition: Vital AF at 55 mL/hr  Free Water 30 mL q4h    Goal of Therapy:  Electrolytes wnl's   Plan:  K phos 30 mmol IV x 1 FU electrolytes in morning   Dorena Bodo, PharmD

## 2020-11-15 LAB — BASIC METABOLIC PANEL
Anion gap: 4 — ABNORMAL LOW (ref 5–15)
BUN: 29 mg/dL — ABNORMAL HIGH (ref 8–23)
CO2: 31 mmol/L (ref 22–32)
Calcium: 8 mg/dL — ABNORMAL LOW (ref 8.9–10.3)
Chloride: 106 mmol/L (ref 98–111)
Creatinine, Ser: 0.47 mg/dL (ref 0.44–1.00)
GFR, Estimated: >60 mL/min (ref >60)
Glucose, Bld: 310 mg/dL — ABNORMAL HIGH (ref 70–99)
Potassium: 3.5 mmol/L (ref 3.5–5.1)
Sodium: 141 mmol/L (ref 135–145)

## 2020-11-15 LAB — CBC
HCT: 32.5 % — ABNORMAL LOW (ref 36.0–46.0)
Hemoglobin: 11.1 g/dL — ABNORMAL LOW (ref 12.0–15.0)
MCH: 32.1 pg (ref 26.0–34.0)
MCHC: 34.2 g/dL (ref 30.0–36.0)
MCV: 93.9 fL (ref 80.0–100.0)
Platelets: 208 10*3/uL (ref 150–400)
RBC: 3.46 MIL/uL — ABNORMAL LOW (ref 3.87–5.11)
RDW: 15.1 % (ref 11.5–15.5)
WBC: 14.8 10*3/uL — ABNORMAL HIGH (ref 4.0–10.5)
nRBC: 0.5 % — ABNORMAL HIGH (ref 0.0–0.2)

## 2020-11-15 LAB — MAGNESIUM: Magnesium: 1.8 mg/dL (ref 1.7–2.4)

## 2020-11-15 LAB — GLUCOSE, CAPILLARY
Glucose-Capillary: 260 mg/dL — ABNORMAL HIGH (ref 70–99)
Glucose-Capillary: 264 mg/dL — ABNORMAL HIGH (ref 70–99)
Glucose-Capillary: 273 mg/dL — ABNORMAL HIGH (ref 70–99)
Glucose-Capillary: 276 mg/dL — ABNORMAL HIGH (ref 70–99)
Glucose-Capillary: 281 mg/dL — ABNORMAL HIGH (ref 70–99)
Glucose-Capillary: 317 mg/dL — ABNORMAL HIGH (ref 70–99)

## 2020-11-15 LAB — PHOSPHORUS: Phosphorus: 2 mg/dL — ABNORMAL LOW (ref 2.5–4.6)

## 2020-11-15 MED ORDER — IPRATROPIUM-ALBUTEROL 0.5-2.5 (3) MG/3ML IN SOLN
3.0000 mL | RESPIRATORY_TRACT | Status: DC
  Administered 2020-11-15 – 2020-11-19 (×24): 3 mL via RESPIRATORY_TRACT
  Filled 2020-11-15 (×24): qty 3

## 2020-11-15 MED ORDER — THIAMINE HCL 100 MG PO TABS
100.0000 mg | ORAL_TABLET | Freq: Every day | ORAL | Status: AC
  Administered 2020-11-15 – 2020-11-17 (×3): 100 mg
  Filled 2020-11-15 (×3): qty 1

## 2020-11-15 MED ORDER — CHLORHEXIDINE GLUCONATE 0.12 % MT SOLN
OROMUCOSAL | Status: AC
  Administered 2020-11-15: 15 mL via OROMUCOSAL
  Filled 2020-11-15: qty 15

## 2020-11-15 MED ORDER — BUDESONIDE 0.5 MG/2ML IN SUSP
0.5000 mg | Freq: Two times a day (BID) | RESPIRATORY_TRACT | Status: DC
  Administered 2020-11-15 – 2020-11-19 (×9): 0.5 mg via RESPIRATORY_TRACT
  Filled 2020-11-15 (×9): qty 2

## 2020-11-15 MED ORDER — POTASSIUM & SODIUM PHOSPHATES 280-160-250 MG PO PACK
2.0000 | PACK | Freq: Three times a day (TID) | ORAL | Status: AC
  Administered 2020-11-15 (×4): 2
  Filled 2020-11-15 (×4): qty 2

## 2020-11-15 MED ORDER — NOREPINEPHRINE BITARTRATE 1 MG/ML IV SOLN
0.0000 ug/min | INTRAVENOUS | Status: DC
  Administered 2020-11-15: 6 ug/min via INTRAVENOUS
  Filled 2020-11-15 (×3): qty 16

## 2020-11-15 MED ORDER — INSULIN GLARGINE 100 UNIT/ML ~~LOC~~ SOLN
10.0000 [IU] | Freq: Two times a day (BID) | SUBCUTANEOUS | Status: DC
  Administered 2020-11-15 – 2020-11-17 (×4): 10 [IU] via SUBCUTANEOUS
  Filled 2020-11-15 (×6): qty 0.1

## 2020-11-15 MED ORDER — INSULIN ASPART 100 UNIT/ML IJ SOLN
6.0000 [IU] | INTRAMUSCULAR | Status: DC
  Administered 2020-11-15 – 2020-11-18 (×19): 6 [IU] via SUBCUTANEOUS
  Filled 2020-11-15 (×19): qty 1

## 2020-11-15 NOTE — Plan of Care (Signed)
Neuro: responds to pain, sedated  Resp: tolerating ventilator, frequent desaturations CV: TMAX 102.2, on cooling blanket throughout the day, vital signs otherwise stable, edema present GIGU: foley in place, BM x 1, tolerating tube feeds Skin: wound care complete, skin otherwise intact Social: Care giver/1st contact called for update today, all questions and concerns addressed  Problem: Education: Goal: Knowledge of General Education information will improve Description: Including pain rating scale, medication(s)/side effects and non-pharmacologic comfort measures Outcome: Not Progressing   Problem: Health Behavior/Discharge Planning: Goal: Ability to manage health-related needs will improve Outcome: Not Progressing   Problem: Clinical Measurements: Goal: Ability to maintain clinical measurements within normal limits will improve Outcome: Not Progressing Goal: Will remain free from infection Outcome: Not Progressing Goal: Diagnostic test results will improve Outcome: Not Progressing Goal: Respiratory complications will improve Outcome: Not Progressing Goal: Cardiovascular complication will be avoided Outcome: Not Progressing   Problem: Activity: Goal: Risk for activity intolerance will decrease Outcome: Not Progressing   Problem: Nutrition: Goal: Adequate nutrition will be maintained Outcome: Not Progressing   Problem: Coping: Goal: Level of anxiety will decrease Outcome: Not Progressing   Problem: Elimination: Goal: Will not experience complications related to bowel motility Outcome: Not Progressing Goal: Will not experience complications related to urinary retention Outcome: Not Progressing   Problem: Pain Managment: Goal: General experience of comfort will improve Outcome: Not Progressing   Problem: Safety: Goal: Ability to remain free from injury will improve Outcome: Not Progressing   Problem: Skin Integrity: Goal: Risk for impaired skin integrity will  decrease Outcome: Not Progressing

## 2020-11-15 NOTE — Progress Notes (Signed)
NAME:  [name], MRN:  [id number], DOB:  1945-08-25, LOS: 6 ADMISSION DATE:  11/02/2020 75 year old with hypertension, hypothyroidism, severe bipolar and depression, diabetes Admitted with altered mental status, intubated for respiratory protection.  Being treated for sepsis likely from aspiration pneumonia   She had multiple recent admissions this year for DKA and was hospitalized in May of this year for aspiration of hotdog which required bronchoscopy and another admission in early June of this year for UTI urinary tract infection   Pertinent  Medical History     has a past medical history of Arthritis, Breast cancer ([place]), Cancer ([place]) (2004), Diabetes mellitus without complication ([place]), Hypercholesterolemia, Hypertension, Hypothyroid, and Seasonal allergies.    Micro Data:  11/03/2020: SARS-CoV-2 and influenza PCR>> negative 10/29/2020: Blood culture x2>> 11/08/2020: Urine>> 11/05/2020: Tracheal aspirate>> gram + cocci in pairs, gram + rods, gram - rods 11/08/2020: Strep pneumo urinary antigen>> negative   Antimicrobials:  Cefepime 6/23>>6/27 Flagyl 6/23>>6/27 Vancomycin 6/23>>6/27  MERO 6/27 -->     Significant Hospital Events: Including procedures, antibiotic start and stop dates in addition to other pertinent events  6/23- admit 6/24: Remains critically ill, on pressors (7 mcg Levophed); tracheal aspirate with gram-positive cocci in pairs, Gram + rods, Gram - rods, cultures and sensitivity still pending 6/26 remains on vent 6/26 failed SAT.SBT, PICC LINE PLACED 6/27 remains on v ent 6/28 remains on vent, pressors 6/29 severe resp failure    Interim History / Subjective:  Remains on vent Severe hypoxia  Vent Mode: PRVC FiO2 (%):  [60 %-65 %] 65 % Set Rate:  [18 bmp] 18 bmp Vt Set:  [420 mL] 420 mL PEEP:  [5 cmH20] 5 cmH20 Plateau Pressure:  [16 cmH20-17 cmH20] 17 cmH20   Intake/Output Summary (Last 24 hours) at 11/15/2020 0717 Last data filed at  11/15/2020 0456 Gross per 24 hour  Intake 1730.48 ml  Output 4180 ml  Net -2449.52 ml          Objective   Blood pressure 126/89, pulse 68, temperature (!) 100.4 F (38 C), temperature source Bladder, resp. rate 18, height 5\' 3"  (1.6 m), weight 80.8 kg, SpO2 93 %.    Vent Mode: PRVC FiO2 (%):  [60 %-65 %] 65 % Set Rate:  [18 bmp] 18 bmp Vt Set:  [420 mL] 420 mL PEEP:  [5 cmH20] 5 cmH20 Plateau Pressure:  [16 cmH20-17 cmH20] 17 cmH20   Intake/Output Summary (Last 24 hours) at 11/15/2020 0717 Last data filed at 11/15/2020 0456 Gross per 24 hour  Intake 1730.48 ml  Output 4180 ml  Net -2449.52 ml   Filed Weights   11/11/20 0500 11/12/20 0456 11/15/20 0346  Weight: 78 kg 80 kg 80.8 kg      REVIEW OF SYSTEMS  PATIENT IS UNABLE TO PROVIDE COMPLETE REVIEW OF SYSTEMS DUE TO SEVERE CRITICAL ILLNESS AND TOXIC METABOLIC ENCEPHALOPATHY   PHYSICAL EXAMINATION:  GENERAL:critically ill appearing, +resp distress HEAD: Normocephalic, atraumatic.  EYES: Pupils equal, round, reactive to light.  No scleral icterus.  MOUTH: Moist mucosal membrane. NECK: Supple. PULMONARY: +rhonchi, +wheezing CARDIOVASCULAR: S1 and S2. Regular rate and rhythm. No murmurs, rubs, or gallops.  GASTROINTESTINAL: Soft, nontender, -distended. Positive bowel sounds.  MUSCULOSKELETAL: No swelling, clubbing, or edema.  NEUROLOGIC: obtunded SKIN:intact,warm,dry   Labs/imaging that I havepersonally reviewed  (right click and "Reselect all SmartList Selections" daily)      ASSESSMENT AND PLAN SYNOPSIS   Acute Hypoxic respiratory failure secondary to aspiration pneumonia Severe ACUTE Hypoxic and Hypercapnic Respiratory  Failure severe hypoxia   Severe ACUTE Hypoxic and Hypercapnic Respiratory Failure -continue Mechanical Ventilator support -continue Bronchodilator Therapy -Wean Fio2 and PEEP as tolerated -VAP/VENT bundle implementation Unable to wean from vent   SEVERE aspiration  pneumonia Prognosis is very poor    CARDIAC ICU monitoring   ACUTE KIDNEY INJURY/Renal Failure -continue Foley Catheter-assess need -Avoid nephrotoxic agents -Follow urine output, BMP -Ensure adequate renal perfusion, optimize oxygenation -Renal dose medications   NEUROLOGY Acute toxic metabolic encephalopathy, need for sedation Goal RASS -2 to -3   SEPTIC SHOCK -use vasopressors to keep MAP>65 as needed -follow ABG and LA -follow up cultures -emperic ABX -consider stress dose steroids -aggressive IV fluid resuscitation  INFECTIOUS DISEASE -continue antibiotics as prescribed -follow up cultures  ENDO - ICU hypoglycemic\Hyperglycemia protocol -check FSBS per protocol   GI GI PROPHYLAXIS as indicated  NUTRITIONAL STATUS DIET-->TF's as tolerated Constipation protocol as indicated   ELECTROLYTES -follow labs as needed -replace as needed -pharmacy consultation and following    Diet/type: tube feeds Pain/Anxiety/Delirium protocol RASS goal -1 VAP protocol (if indicated): Yes DVT prophylaxis: Lovenox GI prophylaxis: H2B Glucose control:  SSI Central venous access:  N/A Arterial line:  N/A Foley:  yes, and still needed Mobility:  bed rest  PT consulted: N/A Studies pending: N/A Culture data pending:sputum, urine , and blood Last reviewed culture data:today Antibiotics:cefepime, vanc, and flagyl Antibiotic de-escalation: no,  continue current rx Stop date: to be determined Code Status:  full code Last date of multidisciplinary goals of care discussion [11/10/20] ccm prognosis: Life-threating Disposition: remains critically ill, will stay in intensive care     Labs   CBC: Recent Labs  Lab 11/03/2020 1211 11/10/20 0112 11/11/20 0336 11/12/20 0531 11/13/20 0426 11/14/20 0542 11/15/20 0445  WBC 14.4*   < > 7.6 9.5 16.5* 17.9* 14.8*  NEUTROABS 10.5*  --   --   --  11.0*  --   --   HGB 14.8   < > 12.4 11.4* 12.7 11.6* 11.1*  HCT 46.1*   < >  38.1 34.4* 37.4 35.6* 32.5*  MCV 94.1   < > 94.8 93.0 91.4 93.7 93.9  PLT 381   < > 243 214 222 182 208   < > = values in this interval not displayed.    Basic Metabolic Panel: Recent Labs  Lab 11/11/20 0336 11/12/20 0531 11/13/20 0426 11/14/20 0542 11/14/20 1900 11/15/20 0445  NA 141 140 143 145  --  141  K 3.5 3.1*  2.9* 2.7* 3.3* 3.7 3.5  CL 114* 110 111 112*  --  106  CO2 22 24 26 27   --  31  GLUCOSE 181* 336* 311* 304*  --  310*  BUN 22 19 11  24*  --  29*  CREATININE 0.65 0.57 0.62 0.56  --  0.47  CALCIUM 8.2* 8.2* 7.9* 7.8*  --  8.0*  MG 1.6* 1.6* 2.0 1.9  --  1.8  PHOS 1.8* 1.1*  <1.0* 2.1* 1.1* 2.1* 2.0*   GFR: Estimated Creatinine Clearance: 61.2 mL/min (by C-G formula based on SCr of 0.47 mg/dL). Recent Labs  Lab 10/30/2020 1518 10/31/2020 1842 11/10/20 0112 11/11/20 0336 11/11/20 1348 11/12/20 0531 11/13/20 0426 11/14/20 0542 11/15/20 0445  PROCALCITON 0.71  --  3.28 2.74  --   --   --   --   --   WBC  --   --  10.5 7.6  --  9.5 16.5* 17.9* 14.8*  LATICACIDVEN 2.8* 5.4* 2.5*  --  1.0  --   --   --   --     Liver Function Tests: Recent Labs  Lab 11/06/2020 1211  AST 20  ALT 12  ALKPHOS 51  BILITOT 0.7  PROT 7.2  ALBUMIN 2.4*   No results for input(s): LIPASE, AMYLASE in the last 168 hours. No results for input(s): AMMONIA in the last 168 hours.  ABG    Component Value Date/Time   PHART 7.50 (H) 10/29/2020 1228   PCO2ART 30 (L) 10/21/2020 1228   PO2ART 97 11/03/2020 1228   HCO3 23.4 11/08/2020 1228   O2SAT 98.1 11/13/2020 1228     Coagulation Profile: No results for input(s): INR, PROTIME in the last 168 hours.  Cardiac Enzymes: No results for input(s): CKTOTAL, CKMB, CKMBINDEX, TROPONINI in the last 168 hours.  HbA1C: Hemoglobin A1C  Date/Time Value Ref Range Status  03/26/2012 06:17 AM 6.7 (H) 4.2 - 6.3 % Final    Comment:    The American Diabetes Association recommends that a primary goal of therapy should be <7% and that  physicians should reevaluate the treatment regimen in patients with HbA1c values consistently >8%.    Hgb A1c MFr Bld  Date/Time Value Ref Range Status  10/23/2020 03:54 PM 7.0 (H) 4.8 - 5.6 % Final    Comment:    (NOTE)         Prediabetes: 5.7 - 6.4         Diabetes: >6.4         Glycemic control for adults with diabetes: <7.0   07/24/2020 01:50 PM 14.2 (H) 4.8 - 5.6 % Final    Comment:    (NOTE) Pre diabetes:          5.7%-6.4%  Diabetes:              >6.4%  Glycemic control for   <7.0% adults with diabetes     CBG: Recent Labs  Lab 11/14/20 1531 11/14/20 1926 11/14/20 2343 11/14/20 2344 11/15/20 0327  GLUCAP 317* 261* 342* 322* 273*    Allergies Allergies  Allergen Reactions   Navane [Thiothixene] Other (See Comments)    Reaction:  Unknown   Penicillins Rash    Has patient had a PCN reaction causing immediate rash, facial/tongue/throat swelling, SOB or lightheadedness with hypotension: No Has patient had a PCN reaction causing severe rash involving mucus membranes or skin necrosis: No Has patient had a PCN reaction that required hospitalization: No Has patient had a PCN reaction occurring within the last 10 years: No If all of the above answers are "NO", then may proceed with Cephalosporin use.  Has patient had a PCN reaction causing immediate rash, facial/tongue/throat swelling, SOB or lightheadedness with hypotension: No Has patient had a PCN reaction causing severe rash involving mucus membranes or skin necrosis: No Has patient had a PCN reaction that required hospitalization: No Has patient had a PCN reaction occurring within the last 10 years: No If all of the above answers are "NO", then may proceed with Cephalosporin use. Has patient had a PCN reaction causing immediate rash, facial/tongue/throat swelling, SOB or lightheadedness with hypotension: No Has patient had a PCN reaction causing severe rash involving mucus membranes or skin necrosis: No Has  patient had a PCN reaction that required hospitalization: No Has patient had a PCN reaction occurring within the last 10 years: No If all of the above answers are "NO", then may proceed with Cephalosporin use.       DVT/GI PRX  assessed  I Assessed the need for Labs I Assessed the need for Foley I Assessed the need for Central Venous Line Family Discussion when available I Assessed the need for Mobilization I made an Assessment of medications to be adjusted accordingly Safety Risk assessment completed  CASE DISCUSSED IN MULTIDISCIPLINARY ROUNDS WITH ICU TEAM     Critical Care Time devoted to patient care services described in this note is 50 minutes.  Critical care was necessary to treat or prevent imminent or life-threatening deterioration. Overall, patient is critically ill, prognosis is guarded.  Patient with Multiorgan failure and at high risk for cardiac arrest and death.    Corrin Parker, M.D.  [facility]  Medical Director [place] Director [facility] Cardio-Pulmonary Department

## 2020-11-15 NOTE — Progress Notes (Signed)
Inpatient Diabetes Program Recommendations  AACE/ADA: New Consensus Statement on Inpatient Glycemic Control   Target Ranges:  Prepandial:   less than 140 mg/dL      Peak postprandial:   less than 180 mg/dL (1-2 hours)      Critically ill patients:  140 - 180 mg/dL  Results for KIMBERLE, STANFILL (MRN [id number]) as of 11/15/2020 09:54  Ref. Range 11/14/2020 07:29 11/14/2020 11:33 11/14/2020 15:31 11/14/2020 19:26 11/14/2020 23:43 11/14/2020 23:44 11/15/2020 03:27 11/15/2020 07:25  Glucose-Capillary Latest Ref Range: 70 - 99 mg/dL 231 (H) 304 (H) 317 (H) 261 (H) 342 (H) 322 (H) 273 (H) 260 (H)    Review of Glycemic Control  Current orders for Inpatient glycemic control: Novolog 0-20 units Q4H, Novolog 4 units Q4H; Solucortef 50 mg Q6H, Vital @ 55 ml/hr  Inpatient Diabetes Program Recommendations:    Insulin: If steroids are continued, please consider ordering Lantus 10 units BID and increasing tube feeding coverage to Novolog 6 units Q4H.  Thanks, Barnie Alderman, RN, MSN, CDE Diabetes Coordinator Inpatient Diabetes Program [phone] (Team Pager from 8am to 5pm)

## 2020-11-15 NOTE — Progress Notes (Signed)
GOALS OF CARE DISCUSSION  The Clinical status was relayed to family in detail. Son [name] updated in [place] Updated and notified of patients medical condition.    Patient remains unresponsive and will not open eyes to command.   Patient is having a weak cough and struggling to remove secretions.   Patient with increased WOB and using accessory muscles to breathe Explained to family course of therapy and the modalities  Severe hypoxia HR fluctuating Febrile Signs of suffering and patient dying process  Patient with Progressive multiorgan failure with a very high probablity of a very minimal chance of meaningful recovery despite all aggressive and optimal medical therapy.  PATIENT REMAINS DNR status  Family understands the situation.    Family are satisfied with Plan of action and management. All questions answered  Additional CC time 35 mins   Katharin Schneider Patricia Pesa, M.D.  [facility]  Medical Director [place] Director [facility] Cardio-Pulmonary Department

## 2020-11-15 NOTE — Consult Note (Signed)
[place] for Electrolyte Monitoring and Replacement   Recent Labs: Potassium (mmol/L)  Date Value  11/15/2020 3.5  02/25/2014 4.4   Magnesium (mg/dL)  Date Value  11/15/2020 1.8   Calcium (mg/dL)  Date Value  11/15/2020 8.0 (L)   Calcium, Total (mg/dL)  Date Value  02/25/2014 9.1   Albumin (g/dL)  Date Value  10/25/2020 2.4 (L)  02/25/2014 4.0   Phosphorus (mg/dL)  Date Value  11/15/2020 2.0 (L)   Sodium (mmol/L)  Date Value  11/15/2020 141  02/25/2014 139     Assessment: 75 yo female with HTN, hypothyroid, severe bipolar/depression, and diabetes admitted with likely aspiration PNA and AMS now intubated and sedated. Pharmacy has been consulted to monitor electrolytes. Since admission renal function has been stable and at apparent baseline.   Nutrition: Vital AF at 55 mL/hr  Free Water 30 mL q4h    Goal of Therapy:  Electrolytes wnl's   Plan:  Phos NaK 2 packets x 4 doses FU electrolytes in morning   Dorena Bodo, PharmD

## 2020-11-16 DIAGNOSIS — Z7189 Other specified counseling: Secondary | ICD-10-CM

## 2020-11-16 LAB — CBC
HCT: 29.9 % — ABNORMAL LOW (ref 36.0–46.0)
Hemoglobin: 9.9 g/dL — ABNORMAL LOW (ref 12.0–15.0)
MCH: 31.2 pg (ref 26.0–34.0)
MCHC: 33.1 g/dL (ref 30.0–36.0)
MCV: 94.3 fL (ref 80.0–100.0)
Platelets: 245 10*3/uL (ref 150–400)
RBC: 3.17 MIL/uL — ABNORMAL LOW (ref 3.87–5.11)
RDW: 15.2 % (ref 11.5–15.5)
WBC: 18.1 10*3/uL — ABNORMAL HIGH (ref 4.0–10.5)
nRBC: 0.4 % — ABNORMAL HIGH (ref 0.0–0.2)

## 2020-11-16 LAB — BASIC METABOLIC PANEL
Anion gap: 9 (ref 5–15)
Anion gap: 6 (ref 5–15)
BUN: 37 mg/dL — ABNORMAL HIGH (ref 8–23)
BUN: 38 mg/dL — ABNORMAL HIGH (ref 8–23)
CO2: 32 mmol/L (ref 22–32)
CO2: 31 mmol/L (ref 22–32)
Calcium: 8.1 mg/dL — ABNORMAL LOW (ref 8.9–10.3)
Calcium: 8.1 mg/dL — ABNORMAL LOW (ref 8.9–10.3)
Chloride: 102 mmol/L (ref 98–111)
Chloride: 102 mmol/L (ref 98–111)
Creatinine, Ser: 0.51 mg/dL (ref 0.44–1.00)
Creatinine, Ser: 0.53 mg/dL (ref 0.44–1.00)
GFR, Estimated: >60 mL/min (ref >60)
GFR, Estimated: >60 mL/min (ref >60)
Glucose, Bld: 213 mg/dL — ABNORMAL HIGH (ref 70–99)
Glucose, Bld: 361 mg/dL — ABNORMAL HIGH (ref 70–99)
Potassium: 2.7 mmol/L — CL (ref 3.5–5.1)
Potassium: 5 mmol/L (ref 3.5–5.1)
Sodium: 143 mmol/L (ref 135–145)
Sodium: 139 mmol/L (ref 135–145)

## 2020-11-16 LAB — GLUCOSE, CAPILLARY
Glucose-Capillary: 184 mg/dL — ABNORMAL HIGH (ref 70–99)
Glucose-Capillary: 190 mg/dL — ABNORMAL HIGH (ref 70–99)
Glucose-Capillary: 287 mg/dL — ABNORMAL HIGH (ref 70–99)
Glucose-Capillary: 352 mg/dL — ABNORMAL HIGH (ref 70–99)
Glucose-Capillary: 321 mg/dL — ABNORMAL HIGH (ref 70–99)
Glucose-Capillary: 350 mg/dL — ABNORMAL HIGH (ref 70–99)

## 2020-11-16 LAB — MAGNESIUM
Magnesium: 1.8 mg/dL (ref 1.7–2.4)
Magnesium: 1.8 mg/dL (ref 1.7–2.4)

## 2020-11-16 LAB — PHOSPHORUS: Phosphorus: 1.2 mg/dL — ABNORMAL LOW (ref 2.5–4.6)

## 2020-11-16 MED ORDER — POLYETHYLENE GLYCOL 3350 17 G PO PACK: 17.0000 g | PACK | Freq: Every day | ORAL | Status: DC | PRN

## 2020-11-16 MED ORDER — PROSOURCE TF PO LIQD
45.0000 mL | Freq: Two times a day (BID) | ORAL | Status: DC
  Administered 2020-11-16 – 2020-11-19 (×7): 45 mL
  Filled 2020-11-16: qty 45

## 2020-11-16 MED ORDER — DOCUSATE SODIUM 50 MG/5ML PO LIQD: 100.0000 mg | Freq: Two times a day (BID) | ORAL | Status: DC | PRN

## 2020-11-16 MED ORDER — NOREPINEPHRINE 16 MG/250ML-% IV SOLN
0.0000 ug/min | INTRAVENOUS | Status: DC
  Administered 2020-11-16: 7 ug/min via INTRAVENOUS
  Filled 2020-11-16 (×3): qty 250

## 2020-11-16 MED ORDER — POTASSIUM CHLORIDE 20 MEQ PO PACK
40.0000 meq | PACK | Freq: Two times a day (BID) | ORAL | Status: DC
  Administered 2020-11-16: 40 meq via ORAL

## 2020-11-16 MED ORDER — POTASSIUM CHLORIDE 20 MEQ PO PACK
40.0000 meq | PACK | Freq: Two times a day (BID) | ORAL | Status: DC
  Administered 2020-11-16 (×2): 40 meq
  Filled 2020-11-16 (×2): qty 2

## 2020-11-16 MED ORDER — POTASSIUM PHOSPHATES 15 MMOLE/5ML IV SOLN
30.0000 mmol | Freq: Once | INTRAVENOUS | Status: AC
  Administered 2020-11-16: 30 mmol via INTRAVENOUS
  Filled 2020-11-16: qty 10

## 2020-11-16 MED ORDER — POTASSIUM CHLORIDE 10 MEQ/50ML IV SOLN
10.0000 meq | INTRAVENOUS | Status: AC
  Administered 2020-11-16 (×2): 10 meq via INTRAVENOUS
  Filled 2020-11-16 (×2): qty 50

## 2020-11-16 MED ORDER — FREE WATER
60.0000 mL | Status: DC
  Administered 2020-11-16 – 2020-11-19 (×19): 60 mL

## 2020-11-16 MED ORDER — VITAL AF 1.2 CAL PO LIQD
1000.0000 mL | ORAL | Status: DC
  Administered 2020-11-16 – 2020-11-19 (×3): 1000 mL

## 2020-11-16 NOTE — Progress Notes (Signed)
NAME:  [name], MRN:  [id number], DOB:  09-09-1945, LOS: 7 ADMISSION DATE:  11/16/2020 75 year old with hypertension, hypothyroidism, severe bipolar and depression, diabetes Admitted with altered mental status, intubated for respiratory protection.  Being treated for sepsis likely from aspiration pneumonia   She had multiple recent admissions this year for DKA and was hospitalized in May of this year for aspiration of hotdog which required bronchoscopy and another admission in early June of this year for UTI urinary tract infection   Pertinent  Medical History     has a past medical history of Arthritis, Breast cancer ([place]), Cancer ([place]) (2004), Diabetes mellitus without complication ([place]), Hypercholesterolemia, Hypertension, Hypothyroid, and Seasonal allergies.    Micro Data:  11/03/2020: SARS-CoV-2 and influenza PCR>> negative 11/16/2020: Blood culture x2>> 11/08/2020: Urine>> 11/15/2020: Tracheal aspirate>> gram + cocci in pairs, gram + rods, gram - rods 11/15/2020: Strep pneumo urinary antigen>> negative   Antimicrobials:  Cefepime 6/23>>6/27 Flagyl 6/23>>6/27 Vancomycin 6/23>>6/27  MERO 6/27 -->     Significant Hospital Events: Including procedures, antibiotic start and stop dates in addition to other pertinent events  6/23- admit 6/24: Remains critically ill, on pressors (7 mcg Levophed); tracheal aspirate with gram-positive cocci in pairs, Gram + rods, Gram - rods, cultures and sensitivity still pending 6/26 remains on vent 6/26 failed SAT.SBT, PICC LINE PLACED 6/27 remains on v ent 6/28 remains on vent, pressors 6/29 severe resp failure    Interim History / Subjective:  Remains on vent On pressors Remains critically ill   Vent Mode: PRVC FiO2 (%):  [65 %] 65 % Set Rate:  [18 bmp] 18 bmp Vt Set:  [420 mL] 420 mL PEEP:  [5 cmH20] 5 cmH20   Intake/Output Summary (Last 24 hours) at 11/16/2020 0746 Last data filed at 11/16/2020 0605 Gross per 24 hour   Intake 1266.85 ml  Output 2820 ml  Net -1553.15 ml           Objective   Blood pressure 104/66, pulse 65, temperature 97.9 F (36.6 C), temperature source Bladder, resp. rate 17, height 5\' 3"  (1.6 m), weight 79.9 kg, SpO2 99 %.    Vent Mode: PRVC FiO2 (%):  [65 %] 65 % Set Rate:  [18 bmp] 18 bmp Vt Set:  [420 mL] 420 mL PEEP:  [5 cmH20] 5 cmH20   Intake/Output Summary (Last 24 hours) at 11/16/2020 0746 Last data filed at 11/16/2020 2778 Gross per 24 hour  Intake 1266.85 ml  Output 2820 ml  Net -1553.15 ml    Filed Weights   11/12/20 0456 11/15/20 0346 11/16/20 0432  Weight: 80 kg 80.8 kg 79.9 kg    REVIEW OF SYSTEMS  PATIENT IS UNABLE TO PROVIDE COMPLETE REVIEW OF SYSTEMS DUE TO SEVERE CRITICAL ILLNESS AND TOXIC METABOLIC ENCEPHALOPATHY   PHYSICAL EXAMINATION:  GENERAL:critically ill appearing, +resp distress HEAD: Normocephalic, atraumatic.  EYES: Pupils equal, round, reactive to light.  No scleral icterus.  MOUTH: Moist mucosal membrane. NECK: Supple. No thyromegaly. No nodules. No JVD.  PULMONARY: +rhonchi, +wheezing CARDIOVASCULAR: S1 and S2. Regular rate and rhythm. No murmurs, rubs, or gallops.  GASTROINTESTINAL: Soft, nontender, -distended. Positive bowel sounds.  MUSCULOSKELETAL: No swelling, clubbing, or edema.  NEUROLOGIC: obtunded SKIN:intact,warm,dry    Labs/imaging that I havepersonally reviewed  (right click and "Reselect all SmartList Selections" daily)      ASSESSMENT AND PLAN SYNOPSIS   Acute Hypoxic respiratory failure secondary to aspiration pneumonia Severe ACUTE Hypoxic and Hypercapnic Respiratory Failure severe hypoxia failure to wean from  vent, severe hypoxia patient is suffering and dying process  Severe ACUTE Hypoxic and Hypercapnic Respiratory Failure -continue Mechanical Ventilator support -continue Bronchodilator Therapy -Wean Fio2 and PEEP as tolerated -VAP/VENT bundle implementation Unable to wean from  vent   SEVERE aspiration pneumonia Prognosis is very poor    CARDIAC ICU monitoring   ACUTE KIDNEY INJURY/Renal Failure -continue Foley Catheter-assess need -Avoid nephrotoxic agents -Follow urine output, BMP -Ensure adequate renal perfusion, optimize oxygenation -Renal dose medications    NEUROLOGY ACUTE TOXIC METABOLIC ENCEPHALOPATHY -need for sedation -Goal RASS -2 to -3   Septic shock -use vasopressors to keep MAP>65 -follow ABG and LA -follow up cultures -emperic ABX -consider stress dose steroids -aggressive IV fluid Resuscitation   INFECTIOUS DISEASE -continue antibiotics as prescribed -follow up cultures -follow up ID consultation  ENDO - ICU hypoglycemic\Hyperglycemia protocol -check FSBS per protocol   GI GI PROPHYLAXIS as indicated  NUTRITIONAL STATUS DIET-->TF's as tolerated Constipation protocol as indicated  ELECTROLYTES -follow labs as needed -replace as needed -pharmacy consultation and following    Diet/type: tube feeds Pain/Anxiety/Delirium protocol RASS goal -1 VAP protocol (if indicated): Yes DVT prophylaxis: Lovenox GI prophylaxis: H2B Glucose control:  SSI Central venous access:  N/A Arterial line:  N/A Foley:  yes, and still needed Mobility:  bed rest  PT consulted: N/A Studies pending: N/A Culture data pending:sputum, urine , and blood Last reviewed culture data:today Antibiotics:cefepime, vanc, and flagyl Antibiotic de-escalation: no,  continue current rx Stop date: to be determined Code Status:  full code Last date of multidisciplinary goals of care discussion [11/10/20] ccm prognosis: Life-threating Disposition: remains critically ill, will stay in intensive care     Labs   CBC: Recent Labs  Lab 10/19/2020 1211 11/10/20 0112 11/12/20 0531 11/13/20 0426 11/14/20 0542 11/15/20 0445 11/16/20 0316  WBC 14.4*   < > 9.5 16.5* 17.9* 14.8* 18.1*  NEUTROABS 10.5*  --   --  11.0*  --   --   --   HGB 14.8    < > 11.4* 12.7 11.6* 11.1* 9.9*  HCT 46.1*   < > 34.4* 37.4 35.6* 32.5* 29.9*  MCV 94.1   < > 93.0 91.4 93.7 93.9 94.3  PLT 381   < > 214 222 182 208 245   < > = values in this interval not displayed.     Basic Metabolic Panel: Recent Labs  Lab 11/12/20 0531 11/13/20 0426 11/14/20 0542 11/14/20 1900 11/15/20 0445 11/16/20 0316  NA 140 143 145  --  141 143  K 3.1*  2.9* 2.7* 3.3* 3.7 3.5 2.7*  CL 110 111 112*  --  106 102  CO2 24 26 27   --  31 32  GLUCOSE 336* 311* 304*  --  310* 213*  BUN 19 11 24*  --  29* 37*  CREATININE 0.57 0.62 0.56  --  0.47 0.51  CALCIUM 8.2* 7.9* 7.8*  --  8.0* 8.1*  MG 1.6* 2.0 1.9  --  1.8 1.8  PHOS 1.1*  <1.0* 2.1* 1.1* 2.1* 2.0* 1.2*    GFR: Estimated Creatinine Clearance: 60.8 mL/min (by C-G formula based on SCr of 0.51 mg/dL). Recent Labs  Lab 10/18/2020 1518 10/30/2020 1842 11/10/20 0112 11/11/20 0336 11/11/20 1348 11/12/20 0531 11/13/20 0426 11/14/20 0542 11/15/20 0445 11/16/20 0316  PROCALCITON 0.71  --  3.28 2.74  --   --   --   --   --   --   WBC  --   --  10.5 7.6  --    < >  16.5* 17.9* 14.8* 18.1*  LATICACIDVEN 2.8* 5.4* 2.5*  --  1.0  --   --   --   --   --    < > = values in this interval not displayed.     Liver Function Tests: Recent Labs  Lab 10/31/2020 1211  AST 20  ALT 12  ALKPHOS 51  BILITOT 0.7  PROT 7.2  ALBUMIN 2.4*    No results for input(s): LIPASE, AMYLASE in the last 168 hours. No results for input(s): AMMONIA in the last 168 hours.  ABG    Component Value Date/Time   PHART 7.50 (H) 10/28/2020 1228   PCO2ART 30 (L) 10/22/2020 1228   PO2ART 97 10/29/2020 1228   HCO3 23.4 10/20/2020 1228   O2SAT 98.1 10/26/2020 1228      Coagulation Profile: No results for input(s): INR, PROTIME in the last 168 hours.  Cardiac Enzymes: No results for input(s): CKTOTAL, CKMB, CKMBINDEX, TROPONINI in the last 168 hours.  HbA1C: Hemoglobin A1C  Date/Time Value Ref Range Status  03/26/2012 06:17 AM 6.7 (H)  4.2 - 6.3 % Final    Comment:    The American Diabetes Association recommends that a primary goal of therapy should be <7% and that physicians should reevaluate the treatment regimen in patients with HbA1c values consistently >8%.    Hgb A1c MFr Bld  Date/Time Value Ref Range Status  10/23/2020 03:54 PM 7.0 (H) 4.8 - 5.6 % Final    Comment:    (NOTE)         Prediabetes: 5.7 - 6.4         Diabetes: >6.4         Glycemic control for adults with diabetes: <7.0   07/24/2020 01:50 PM 14.2 (H) 4.8 - 5.6 % Final    Comment:    (NOTE) Pre diabetes:          5.7%-6.4%  Diabetes:              >6.4%  Glycemic control for   <7.0% adults with diabetes     CBG: Recent Labs  Lab 11/15/20 1542 11/15/20 1922 11/15/20 2359 11/16/20 0308 11/16/20 0722  GLUCAP 317* 264* 276* 184* 190*     Allergies Allergies  Allergen Reactions   Navane [Thiothixene] Other (See Comments)    Reaction:  Unknown   Penicillins Rash    Has patient had a PCN reaction causing immediate rash, facial/tongue/throat swelling, SOB or lightheadedness with hypotension: No Has patient had a PCN reaction causing severe rash involving mucus membranes or skin necrosis: No Has patient had a PCN reaction that required hospitalization: No Has patient had a PCN reaction occurring within the last 10 years: No If all of the above answers are "NO", then may proceed with Cephalosporin use.  Has patient had a PCN reaction causing immediate rash, facial/tongue/throat swelling, SOB or lightheadedness with hypotension: No Has patient had a PCN reaction causing severe rash involving mucus membranes or skin necrosis: No Has patient had a PCN reaction that required hospitalization: No Has patient had a PCN reaction occurring within the last 10 years: No If all of the above answers are "NO", then may proceed with Cephalosporin use. Has patient had a PCN reaction causing immediate rash, facial/tongue/throat swelling, SOB or  lightheadedness with hypotension: No Has patient had a PCN reaction causing severe rash involving mucus membranes or skin necrosis: No Has patient had a PCN reaction that required hospitalization: No Has patient had a PCN reaction occurring  within the last 10 years: No If all of the above answers are "NO", then may proceed with Cephalosporin use.         DVT/GI PRX  assessed I Assessed the need for Labs I Assessed the need for Foley I Assessed the need for Central Venous Line Family Discussion when available I Assessed the need for Mobilization I made an Assessment of medications to be adjusted accordingly Safety Risk assessment completed  CASE DISCUSSED IN MULTIDISCIPLINARY ROUNDS WITH ICU TEAM     Critical Care Time devoted to patient care services described in this note is 50 minutes.  Critical care was necessary to treat /prevent imminent and life-threatening deterioration. Overall, patient is critically ill, prognosis is guarded.  Patient with Multiorgan failure and at high risk for cardiac arrest and death.    Corrin Parker, M.D.  [facility]  Medical Director [place] Director [facility] Cardio-Pulmonary Department

## 2020-11-16 NOTE — Consult Note (Signed)
[place] for Electrolyte Monitoring and Replacement   Recent Labs: Potassium (mmol/L)  Date Value  11/16/2020 2.7 (LL)  02/25/2014 4.4   Magnesium (mg/dL)  Date Value  11/16/2020 1.8   Calcium (mg/dL)  Date Value  11/16/2020 8.1 (L)   Calcium, Total (mg/dL)  Date Value  02/25/2014 9.1   Albumin (g/dL)  Date Value  11/12/2020 2.4 (L)  02/25/2014 4.0   Phosphorus (mg/dL)  Date Value  11/16/2020 1.2 (L)   Sodium (mmol/L)  Date Value  11/16/2020 143  02/25/2014 139     Assessment: 75 yo female with HTN, hypothyroid, severe bipolar/depression, and diabetes admitted with likely aspiration PNA and AMS now intubated and sedated. Pharmacy has been consulted to monitor electrolytes. Since admission renal function has been stable and at apparent baseline.   Nutrition: Vital AF at 55 mL/hr  Free Water 30 mL q4h    Goal of Therapy:  Electrolytes wnl's   Plan:  Potassium 10 mEq IV x 2 given this morning and started on 40 mEq per tube BID Add potassium phos 30 mmol IV for hypophosphatemia FU electrolytes in morning   Dorena Bodo, PharmD

## 2020-11-16 NOTE — Progress Notes (Signed)
Pt remains on vent.   Fio2 weaned from 65% to 45%  Pt remains on Tube Feeds, rate decreased from 55 to 45 ml/hr, flush increased from 30 to 60 ml q4 hrs.   Pt remains on multiple gtts: Levo, vaso, fent, and versed.   Sacral dressing changed today, Santyl + moist to dry + abd pad + sacral pad.   Turn q2. Pt in no acute distress @ this time. Will continue to monitor.

## 2020-11-16 NOTE — Consult Note (Signed)
Consultation Note Date: 11/16/2020   Patient Name: [name]  DOB: January 13, 1946  MRN: [id number]  Age / Sex: 75 y.o., female   PCP: Cindy Claude, FNP Referring Physician: Flora Lipps, MD   REASON FOR CONSULTATION:Establishing goals of care  Palliative Care consult requested for goals of care discussion in this 75 y.o. female with a medical history significant for hypothyroidism, diabetes, bipolar, depression, hypothyroidism, arthritis, and breast cancer. She presented to the ED via EMS from [facility] facility after being found unresponsive with respiratory distress. Oxygen saturations were 65% and went up to 86% on 15L nonrebreather. Patient have had multiple admissions this year. She was admitted on 4/29 secondary to respiratory failure due to aspiration on a hot dog. At that time she required intubation and bronchoscopy to clear food particles. On arrival patient required intubation for airway protection. She is requiring multiple pressors. Tracheal aspirate positive for gram cocci in pairs and gram + rods. She is receiving IV antibiotics. Remains on full ventilator support and unresponsive.   Clinical Assessment and Goals of Care: I have reviewed medical records including lab results, imaging, Epic notes, and MAR, received report from the bedside RN, and assessed the patient.   Patient remains unresponsive/obtunded on full ventilator support. Will intermittently open eyes with no relation to commands or stimuli.   I spoke with patient's son, [name] via phone to discuss diagnosis prognosis, [place], EOL wishes, disposition and options.  Patient is familiar to myself and the Palliative team from previous admissions. I re-introduced myself to patient's son and explained Palliative's role in his mother's care. Son verbalized understanding.   [name] has not seen his mother in many years. He states they did not have the best relationship and his sister has  nothing to do with patient. He is not able to provide much history or information on patient.   We discussed Her current illness and what it means in the larger context of Her on-going co-morbidities. Natural disease trajectory and expectations at EOL were discussed. I discussed at length patient's multiorgan failure, failure to wean from ventilator, and the need for pressor support. Education provided on poor prognosis with minimal chances of a meaningful recovery.   Son verbalized understanding. He states she would not want to live on life support pro-longed and knows that she has had multiple hospitalizations over the past 6-9 months.   He shares his regrets of not coming to see patient sooner but also that due to their relationship he did not feel the need until now.   A detailed discussion was had today regarding advanced directives.  [name] does not have an advanced directive however, on previous admissions she was clear that her son would be her medical decision maker and he is her next of kin. He verbalizes understanding.   I discussed at length tracheostomy and peg. Son expressed he would not want these procedures to be done for patient and would rather her have a natural death without prolonging her life.   The difference between a aggressive medical intervention and a palliative comfort care path were discussed at length.   Values and goals of care important to patient and family were attempted to be elicited.    [name] is clear in his expressed wishes on behalf of his mother to continue to treat the treatable at this time. He does not wish to pursue trach or peg. He states his wife is arranging a flight and he is  hopeful that he can arrive by Saturday or Sunday to see his mother, say a prayer over her, and say his final good byes. He would then wish to allow her to pass away peacefully and without machines for a compassionate extubation. He understands patient remains critical  and is at high risk of sudden death or cardiac event. He states he is hopeful she will still be here allowing him time but if she does pass away prior to his arrival he will be at peace also.   [name] does share that he is feeling better but is recovering from COVID-19.   Questions and concerns were addressed. The family was encouraged to call with questions or concerns.  PMT will continue to support holistically as needed.   CODE STATUS: DNR  ADVANCE DIRECTIVES: Primary Decision Maker: [name] (Son)    SYMPTOM MANAGEMENT:per attending   Palliative Prophylaxis:  Aspiration, Bowel Regimen, Eye Care, Frequent Pain Assessment, Oral Care, and Turn Reposition  PSYCHO-SOCIAL/SPIRITUAL: Support System: Family Desire for further Chaplaincy support:no  Additional Recommendations (Limitations, Scope, Preferences): No Tracheostomy and continue to treat the treatable with a goal of family visiting this weekend followed by compassionate extubation, no escalation in care.  Education on palliative    PAST MEDICAL HISTORY: Past Medical History:  Diagnosis Date   Arthritis    Breast cancer ([place])    Cancer ([place]) 2004   right breast ca   Diabetes mellitus without complication ([place])    Hypercholesterolemia    Hypertension    Hypothyroid    Seasonal allergies     ALLERGIES:  is allergic to navane [thiothixene] and penicillins.   MEDICATIONS:  Current Facility-Administered Medications  Medication Dose Route Frequency Provider Last Rate Last Admin   0.9 %  sodium chloride infusion  250 mL Intravenous Continuous Margaretha Seeds, MD   Stopped at 11/15/20 2305   acetaminophen (TYLENOL) tablet 650 mg  650 mg Per Tube Q4H PRN Lu Duffel, RPH   650 mg at 11/13/20 1211   budesonide (PULMICORT) nebulizer solution 0.5 mg  0.5 mg Nebulization BID Cindy Lipps, MD   0.5 mg at 11/16/20 0755   chlorhexidine gluconate (MEDLINE KIT) (PERIDEX) 0.12 % solution 15 mL  15 mL Mouth Rinse  BID Mannam, Praveen, MD   15 mL at 11/16/20 0743   Chlorhexidine Gluconate Cloth 2 % PADS 6 each  6 each Topical Daily Marshell Garfinkel, MD   6 each at 11/15/20 2158   collagenase (SANTYL) ointment   Topical Daily Nelle Don, MD   Given at 11/16/20 1000   docusate (COLACE) 50 MG/5ML liquid 100 mg  100 mg Per Tube BID PRN Cindy Lipps, MD       enoxaparin (LOVENOX) injection 40 mg  40 mg Subcutaneous Q24H [name] D, NP   40 mg at 11/15/20 1611   famotidine (PEPCID) IVPB 20 mg premix  20 mg Intravenous Q12H [name] D, NP 100 mL/hr at 11/16/20 0955 20 mg at 11/16/20 0955   feeding supplement (PROSource TF) liquid 45 mL  45 mL Per Tube BID Cindy Lipps, MD   45 mL at 11/16/20 1211   feeding supplement (VITAL AF 1.2 CAL) liquid 1,000 mL  1,000 mL Per Tube Continuous Cindy Lipps, MD 45 mL/hr at 11/16/20 1215 1,000 mL at 11/16/20 1215   fentaNYL 2578mg in NS 2557m(1090mml) infusion-PREMIX  0-100 mcg/hr Intravenous Continuous FunVanessa DurhamD 10 mL/hr at 11/16/20 0605 100 mcg/hr at 11/16/20 0605   free water  60 mL  60 mL Per Tube Q4H Cindy Lipps, MD   60 mL at 11/16/20 1211   hydrocortisone sodium succinate (SOLU-CORTEF) 100 MG injection 50 mg  50 mg Intravenous Q6H Cindy Lipps, MD   50 mg at 11/16/20 1211   ibuprofen (ADVIL) tablet 800 mg  800 mg Per Tube TID PRN Lockie Mola B, RPH   800 mg at 11/14/20 0250   insulin aspart (novoLOG) injection 0-20 Units  0-20 Units Subcutaneous Q4H Cindy Lipps, MD   11 Units at 11/16/20 1211   insulin aspart (novoLOG) injection 6 Units  6 Units Subcutaneous Q4H Cindy Lipps, MD   6 Units at 11/16/20 1211   insulin glargine (LANTUS) injection 10 Units  10 Units Subcutaneous BID Cindy Lipps, MD   10 Units at 11/16/20 0956   ipratropium-albuterol (DUONEB) 0.5-2.5 (3) MG/3ML nebulizer solution 3 mL  3 mL Nebulization Q4H PRN [name] D, NP       ipratropium-albuterol (DUONEB) 0.5-2.5 (3) MG/3ML nebulizer solution 3 mL  3 mL Nebulization  Q4H Cindy Lipps, MD   3 mL at 11/16/20 1134   levothyroxine (SYNTHROID) tablet 50 mcg  50 mcg Per Tube Q0600 Nelle Don, MD   50 mcg at 11/16/20 0548   MEDLINE mouth rinse  15 mL Mouth Rinse 10 times per day Marshell Garfinkel, MD   15 mL at 11/16/20 1211   midazolam (VERSED) 50 mg/50 mL (1 mg/mL) premix infusion  0.5-10 mg/hr Intravenous Continuous Vanessa Reinerton, MD 3 mL/hr at 11/16/20 1213 3 mg/hr at 11/16/20 1213   midodrine (PROAMATINE) tablet 10 mg  10 mg Per Tube TID WC [name] D, NP   10 mg at 11/16/20 1211   norepinephrine (LEVOPHED) 16-5 MG/250ML-% 16 mg in 250 mL (0.064 mg/mL) infusion  0-40 mcg/min Intravenous Titrated Rito Ehrlich A, RPH 6.56 mL/hr at 11/16/20 0959 7 mcg/min at 11/16/20 1610   nutrition supplement (JUVEN) (JUVEN) powder packet 1 packet  1 packet Per Tube BID BM Nelle Don, MD   1 packet at 11/16/20 0956   ondansetron (ZOFRAN) injection 4 mg  4 mg Intravenous Q6H PRN Bradly Bienenstock, NP       polyethylene glycol (MIRALAX / GLYCOLAX) packet 17 g  17 g Per Tube Daily PRN Cindy Lipps, MD       potassium chloride (KLOR-CON) packet 40 mEq  40 mEq Per Tube BID Renda Rolls, RPH   40 mEq at 11/16/20 0955   potassium PHOSPHATE 30 mmol in dextrose 5 % 500 mL infusion  30 mmol Intravenous Once Cindy Lipps, MD 85 mL/hr at 11/16/20 1010 30 mmol at 11/16/20 1010   sodium chloride flush (NS) 0.9 % injection 10-40 mL  10-40 mL Intracatheter Q12H Cindy Lipps, MD   10 mL at 11/16/20 0956   sodium chloride flush (NS) 0.9 % injection 10-40 mL  10-40 mL Intracatheter PRN Cindy Lipps, MD       thiamine tablet 100 mg  100 mg Per Tube Daily Cindy Lipps, MD   100 mg at 11/16/20 0956   vasopressin (PITRESSIN) 20 Units in sodium chloride 0.9 % 100 mL infusion-*FOR SHOCK*  0-0.03 Units/min Intravenous Continuous Cindy Lipps, MD 9 mL/hr at 11/16/20 0605 0.03 Units/min at 11/16/20 0605    VITAL SIGNS: BP (!) 101/52   Pulse (!) 59   Temp 100 F (37.8 C) (Bladder)  Comment: cooling blanket turned back on  Resp 17   Ht _0  (1.6 m)   Wt 79.9 kg  SpO2 97%   BMI 31.20 kg/m  Filed Weights   11/12/20 0456 11/15/20 0346 11/16/20 0432  Weight: 80 kg 80.8 kg 79.9 kg    Estimated body mass index is 31.2 kg/m as calculated from the following:   Height as of this encounter: _0  (1.6 m).   Weight as of this encounter: 79.9 kg.  LABS: CBC:    Component Value Date/Time   WBC 18.1 (H) 11/16/2020 0316   HGB 9.9 (L) 11/16/2020 0316   HGB 13.9 02/25/2014 0927   HCT 29.9 (L) 11/16/2020 0316   HCT 43.7 02/25/2014 0927   PLT 245 11/16/2020 0316   PLT 327 02/25/2014 0927   Comprehensive Metabolic Panel:    Component Value Date/Time   NA 143 11/16/2020 0316   NA 139 02/25/2014 0927   K 2.7 (LL) 11/16/2020 0316   K 4.4 02/25/2014 0927   BUN 37 (H) 11/16/2020 0316   BUN 13 02/25/2014 0927   CREATININE 0.51 11/16/2020 0316   CREATININE 1.10 02/25/2014 0927   ALBUMIN 2.4 (L) 11/04/2020 1211   ALBUMIN 4.0 02/25/2014 0927     Review of Systems  Unable to perform ROS: Intubated   Physical Exam General: Intubated, crirically-ill appearing Cardiovascular: regular rate and rhythm Pulmonary: full vent support, rhonchi  Abdomen: soft, nontender, + bowel sounds Extremities: no edema, no joint deformities Neurological: obtunded    Prognosis: POOR   Discharge Planning:  Anticipated Hospital Death in the setting family arrives and pursue compassionate extubation and comfort.   Recommendations: DNR/DNI-as confirmed by son [name] No Tracheostomy/No PEG Continue with current plan of care, treat the treatable, no escalation  Extensive discussion with updates provided to son and his wife. Patient's daugher-in-law is arranging a flight for son to get here over the weekend (hopeful for Saturday) to see patient and say his good byes. Son is aware of condition and understands high risk of sudden death. He is hopeful to see her alive but shares he would be  at peace if she passes before his arrival also.  PMT will continue to support and follow as needed. Please call team line with urgent needs.   Palliative Performance Scale: Intubated               Son, [name] expressed understanding and was in agreement with this plan.   Thank you for allowing the Palliative Medicine Team to assist in the care of this patient. Please utilize secure chat with additional questions, if there is no response within 30 minutes please call the above phone number.   Time In: 1350 Time Out: 1440 Time Total: 50 min.   Visit consisted of counseling and education dealing with the complex and emotionally intense issues of symptom management and palliative care in the setting of serious and potentially life-threatening illness.Greater than 50%  of this time was spent counseling and coordinating care related to the above assessment and plan.  Signed by:  Alda Lea, AGPCNP-BC Palliative Medicine Team  Phone: [phone] Pager: [phone] Amion: [place] Team providers are available by phone from 7am to 7pm daily and can be reached through the team cell phone.  Should this patient require assistance outside of these hours, please call the patient's attending physician.

## 2020-11-16 NOTE — Progress Notes (Signed)
     Referral received for [facility] :goals of care discussion. Patient is familiar to myself and the Palliative team from previous hospitalizations. Chart reviewed and updates received. Patient assessed and is unable to engage appropriately in discussions. Remains intubated and unable to wean. Attempted to contact patient's son, [name] who lives in [place]. His wife answered the phone and stated [name] was busy and did not have time to come to the phone. I introduced myself and my role in patient's care. Son heard in the background asking for wife to get an update. Updates provided and requested for son to allow some time to further discuss. Son in background asking to get number and he would call when he got time. I provided my contact information. Wife verbalized appreciation confirming he would return the call when he was available.   Detailed note and recommendations to follow once GOC has been completed.   Thank you for your referral and allowing PMT to assist in [name]'s care.   Alda Lea, AGPCNP-BC Palliative Medicine Team  Phone: [phone] Pager: [phone] Amion: N. [place]   NO CHARGE

## 2020-11-16 NOTE — Progress Notes (Signed)
Nutrition Follow-up  DOCUMENTATION CODES:  Not applicable  INTERVENTION:  Continue tube feeds via OGT Vital 1.2 @ 42ml/hr with Prosource TF BID (40kcal and 11 g of protien per packet) Free water flushes 52ml q4 hours to maintain tube patency  Regimen provides 1376kcal/day, 103g/day protein and 1248ml/day free water  Pt at high refeed risk; recommend monitor potassium, magnesium and phosphorus labs daily until stable. 100mg  thiamine daily x 3 days Juven Fruit Punch BID via tube, each serving provides 95kcal and 2.5g of protein (amino acids glutamine and arginine)  NUTRITION DIAGNOSIS:  Inadequate oral intake related to inability to eat (pt sedated and ventilated) as evidenced by NPO status.  GOAL:  Provide needs based on ASPEN/SCCM guidelines  MONITOR:  Vent status, Labs, Weight trends, TF tolerance, Skin, I & O's  REASON FOR ASSESSMENT:  Ventilator    ASSESSMENT:  75 y/o female with h/o hypertension, hypothyroidism, breast ca s/p lumpectomy, severe bipolar, depression and diabetes who is admitted with altered mental status and found to have sepsis secondary to aspiration PNA and intubated for respiratory protection.  Pt remains intubated and sedated at this time. Decreasing need for pressor support. OGT in place with TF running at goal rate of 7mL/h. Labs remain altered and suggestive of refeeding syndrome despite several days of full feeds and electrolyte replacement. Thiamine added yesterday to be given x 3 days. Will monitor labs for need for additional days of supplementation.  Weight noted to be closer to UBW, likely that initial weights this admission were inaccurate. Some edema present, but improved from earlier in the week. Will reassess needs with new weight.  Discussed in rounds, prognosis remains poor.   +OGT in place (gastric)  Patient is currently intubated on ventilator support MV: 7.6 L/min Temp (24hrs), Avg:98.9 F (37.2 C), Min:97.9 F (36.6 C), Max:99.9 F  (37.7 C)   Intake/Output Summary (Last 24 hours) at 11/16/2020 1054 Last data filed at 11/16/2020 0605 Gross per 24 hour  Intake 862.14 ml  Output 2000 ml  Net -1137.86 ml  Net IO Since Admission: 3,598.73 mL [11/16/20 1054]  Nutritionally Relevant Medications: Scheduled Meds:  docusate  100 mg Per Tube BID   free water  30 mL Per Tube Q4H   insulin aspart  0-20 Units Subcutaneous Q4H   insulin aspart  6 Units Subcutaneous Q4H   insulin glargine  10 Units Subcutaneous BID   nutrition supplement (JUVEN)  1 packet Per Tube BID BM   polyethylene glycol  17 g Per Tube Daily   potassium chloride  40 mEq Per Tube BID   thiamine  100 mg Per Tube Daily   Continuous Infusions:  feeding supplement (VITAL AF 1.2 CAL) 1,000 mL (11/16/20 0354)   fentaNYL infusion INTRAVENOUS 100 mcg/hr (11/16/20 0605)   midazolam 3 mg/hr (11/16/20 0605)   potassium PHOSPHATE IVPB (in mmol)     vasopressin 0.03 Units/min (11/16/20 0605)   PRN Meds: ondansetron  Labs Reviewed: K 2.7 Phosphorus 1.2 BUN 37 SBG ranges from 184-317 mg/dL over the last 24 hours HgbA1c 7% (10/23/20)  NUTRITION - FOCUSED PHYSICAL EXAM: Flowsheet Row Most Recent Value  Orbital Region No depletion  Upper Arm Region Mild depletion  Thoracic and Lumbar Region No depletion  Buccal Region No depletion  Temple Region No depletion  Clavicle Bone Region No depletion  Clavicle and Acromion Bone Region No depletion  Scapular Bone Region No depletion  Dorsal Hand No depletion  Patellar Region Mild depletion  Anterior Thigh Region Mild depletion  Posterior Calf Region Mild depletion  Edema (RD Assessment) Mild  Hair Reviewed  Eyes Reviewed  Mouth Reviewed  Skin Reviewed  Nails Reviewed   Diet Order:   Diet Order             Diet NPO time specified  Diet effective now                  EDUCATION NEEDS:  No education needs have been identified at this time  Skin:  Skin Assessment: Reviewed RN Assessment (pressure  injury heel, sacral ulcer)  Last BM:  6/30 - type 7, rectal tube in place  Height:  Ht Readings from Last 1 Encounters:  11/07/2020 5\' 3"  (1.6 m)    Weight:  Wt Readings from Last 1 Encounters:  11/16/20 79.9 kg    Ideal Body Weight:  52.2 kg  BMI:  Body mass index is 31.2 kg/m.  Estimated Nutritional Needs:  Kcal:  1328 kcal/d Protein:  >105g/d Fluid:  1.5-1.8 L/d  Ranell Patrick, RD, LDN Clinical Dietitian Pager on [place]

## 2020-11-17 LAB — CBC
HCT: 30.7 % — ABNORMAL LOW (ref 36.0–46.0)
Hemoglobin: 9.7 g/dL — ABNORMAL LOW (ref 12.0–15.0)
MCH: 31.1 pg (ref 26.0–34.0)
MCHC: 31.6 g/dL (ref 30.0–36.0)
MCV: 98.4 fL (ref 80.0–100.0)
Platelets: 248 10*3/uL (ref 150–400)
RBC: 3.12 MIL/uL — ABNORMAL LOW (ref 3.87–5.11)
RDW: 15.7 % — ABNORMAL HIGH (ref 11.5–15.5)
WBC: 16 10*3/uL — ABNORMAL HIGH (ref 4.0–10.5)
nRBC: 0.8 % — ABNORMAL HIGH (ref 0.0–0.2)

## 2020-11-17 LAB — BASIC METABOLIC PANEL
Anion gap: 6 (ref 5–15)
BUN: 40 mg/dL — ABNORMAL HIGH (ref 8–23)
CO2: 31 mmol/L (ref 22–32)
Calcium: 8.4 mg/dL — ABNORMAL LOW (ref 8.9–10.3)
Chloride: 104 mmol/L (ref 98–111)
Creatinine, Ser: 0.56 mg/dL (ref 0.44–1.00)
GFR, Estimated: >60 mL/min (ref >60)
Glucose, Bld: 297 mg/dL — ABNORMAL HIGH (ref 70–99)
Potassium: 5.2 mmol/L — ABNORMAL HIGH (ref 3.5–5.1)
Sodium: 141 mmol/L (ref 135–145)

## 2020-11-17 LAB — TROPONIN I (HIGH SENSITIVITY): Troponin I (High Sensitivity): 64 ng/L — ABNORMAL HIGH (ref <18)

## 2020-11-17 LAB — GLUCOSE, CAPILLARY
Glucose-Capillary: 248 mg/dL — ABNORMAL HIGH (ref 70–99)
Glucose-Capillary: 289 mg/dL — ABNORMAL HIGH (ref 70–99)
Glucose-Capillary: 284 mg/dL — ABNORMAL HIGH (ref 70–99)
Glucose-Capillary: 300 mg/dL — ABNORMAL HIGH (ref 70–99)
Glucose-Capillary: 227 mg/dL — ABNORMAL HIGH (ref 70–99)

## 2020-11-17 LAB — MAGNESIUM: Magnesium: 1.9 mg/dL (ref 1.7–2.4)

## 2020-11-17 LAB — PHOSPHORUS: Phosphorus: 2 mg/dL — ABNORMAL LOW (ref 2.5–4.6)

## 2020-11-17 LAB — TSH: TSH: 2.818 u[IU]/mL (ref 0.350–4.500)

## 2020-11-17 LAB — T4, FREE: Free T4: 0.51 ng/dL — ABNORMAL LOW (ref 0.61–1.12)

## 2020-11-17 MED ORDER — AMIODARONE IV BOLUS ONLY 150 MG/100ML
INTRAVENOUS | Status: AC
  Administered 2020-11-17: 150 mg
  Filled 2020-11-17: qty 100

## 2020-11-17 MED ORDER — AMIODARONE IV BOLUS ONLY 150 MG/100ML: 150.0000 mg | Freq: Once | INTRAVENOUS | Status: DC

## 2020-11-17 MED ORDER — INSULIN GLARGINE 100 UNIT/ML ~~LOC~~ SOLN
15.0000 [IU] | Freq: Two times a day (BID) | SUBCUTANEOUS | Status: DC
  Administered 2020-11-17 – 2020-11-18 (×3): 15 [IU] via SUBCUTANEOUS
  Filled 2020-11-17 (×4): qty 0.15

## 2020-11-17 MED ORDER — AMIODARONE HCL IN DEXTROSE 360-4.14 MG/200ML-% IV SOLN: 30.0000 mg/h | INTRAVENOUS | Status: DC

## 2020-11-17 MED ORDER — SODIUM ZIRCONIUM CYCLOSILICATE 5 G PO PACK
5.0000 g | PACK | Freq: Once | ORAL | Status: AC
  Administered 2020-11-17: 5 g
  Filled 2020-11-17: qty 1

## 2020-11-17 MED ORDER — AMIODARONE HCL IN DEXTROSE 360-4.14 MG/200ML-% IV SOLN: 60.0000 mg/h | INTRAVENOUS | Status: AC

## 2020-11-17 MED ORDER — AMIODARONE HCL IN DEXTROSE 360-4.14 MG/200ML-% IV SOLN
INTRAVENOUS | Status: AC
  Filled 2020-11-17: qty 200

## 2020-11-17 MED ORDER — SODIUM PHOSPHATES 45 MMOLE/15ML IV SOLN
20.0000 mmol | Freq: Once | INTRAVENOUS | Status: AC
  Administered 2020-11-17: 20 mmol via INTRAVENOUS
  Filled 2020-11-17: qty 6.67

## 2020-11-17 NOTE — Progress Notes (Signed)
   Daily Progress Note   Patient Name: [name]       Date: 11/17/2020 DOB: 05/21/1945  Age: 75 y.o. MRN#: [id number] Attending Physician: Tyler Pita, MD Primary Care Physician: Mechele Claude, FNP Admit Date: 11/12/2020  Reason for Consultation/Follow-up: Establishing goals of care  Subjective: Chart Reviewed. Updates Received. Patient Assessed.   Patient remains intubated. Unresponsive.   Updates have been provided to her son, [name]. He verbalizes understanding and expresses plans to be in town Saturday to see patient and say his good byes with intentions of allowing patient to have a natural death and not prolong.   Education provided on what comfort care and one-way extubation will look like. Son verbalized understanding.   All questions answered and support provided.   Length of Stay: 8 days  Vital Signs: BP 90/64   Pulse (S) (!) 151   Temp 99.4 F (37.4 C) (Axillary)   Resp 17   Ht 5\' 3"  (1.6 m)   Wt 74.4 kg   SpO2 90%   BMI 29.06 kg/m  SpO2: SpO2: 90 % O2 Device: O2 Device: Ventilator O2 Flow Rate:    Physical Exam: Critically ill, remains on full vent support RRR Obtunded          Palliative Care Assessment & Plan  HPI: Palliative Care consult requested for goals of care discussion in this 75 y.o. female with a medical history significant for hypothyroidism, diabetes, bipolar, depression, hypothyroidism, arthritis, and breast cancer. She presented to the ED via EMS from [facility] facility after being found unresponsive with respiratory distress. Oxygen saturations were 65% and went up to 86% on 15L nonrebreather. Patient have had multiple admissions this year. She was admitted on 4/29 secondary to respiratory failure due to aspiration on a hot dog. At that time she required intubation and bronchoscopy to clear food particles. On arrival patient required intubation for airway protection. She is requiring multiple pressors. Tracheal  aspirate positive for gram cocci in pairs and gram + rods. She is receiving IV antibiotics. Remains on full ventilator support and unresponsive.   Code Status: DNR  Goals of Care/Recommendations: Continue to treat the treatable. Son preparing to arrive Saturday expressing plans to see patient and express his good-byes. He would not wish to prolong her life. No Trach/PEG.  No PMT weekend coverage.   Prognosis: POOR    Thank you for allowing the Palliative Medicine Team to assist in the care of this patient.  Time Total: 35 min.   Visit consisted of counseling and education dealing with the complex and emotionally intense issues of symptom management and palliative care in the setting of serious and potentially life-threatening illness.Greater than 50%  of this time was spent counseling and coordinating care related to the above assessment and plan.  Alda Lea, AGPCNP-BC  Palliative Medicine Team [phone]

## 2020-11-17 NOTE — Consult Note (Signed)
[facility] for Electrolyte Monitoring and Replacement   Recent Labs: Potassium (mmol/L)  Date Value  11/17/2020 5.2 (H)  02/25/2014 4.4   Magnesium (mg/dL)  Date Value  11/17/2020 1.9   Calcium (mg/dL)  Date Value  11/17/2020 8.4 (L)   Calcium, Total (mg/dL)  Date Value  02/25/2014 9.1   Albumin (g/dL)  Date Value  11/11/2020 2.4 (L)  02/25/2014 4.0   Phosphorus (mg/dL)  Date Value  11/17/2020 2.0 (L)   Sodium (mmol/L)  Date Value  11/17/2020 141  02/25/2014 139     Assessment: 75 yo female with HTN, hypothyroid, severe bipolar/depression, and diabetes admitted with likely aspiration PNA and AMS now intubated and sedated. Pharmacy has been consulted to monitor electrolytes. Since admission renal function has been stable and at apparent baseline.   Nutrition: Vital AF at 55 mL/hr  Free Water 30 mL q4h    Goal of Therapy:  Electrolytes wnl's   Plan:  Potassium elevated this morning. Scheduled replacement discontinued and patient given one dose of Lokelma. Persistent hypophosphatemia despite daily replacement. Hesistant for PO replacement with Phos NaK given increase in potassium. Will give Na phos 20 mmol IV today.  FU electrolytes with morning labs.  Dorena Bodo, PharmD

## 2020-11-17 NOTE — Progress Notes (Signed)
NAME:  [name], MRN:  [id number], DOB:  03-28-1946, LOS: 8 ADMISSION DATE:  11/14/2020 75 year old with hypertension, hypothyroidism, severe bipolar and depression, diabetes Admitted with altered mental status, intubated for respiratory protection.  Being treated for sepsis likely from aspiration pneumonia   She had multiple recent admissions this year for DKA and was hospitalized in May of this year for aspiration of hotdog which required bronchoscopy and another admission in early June of this year for UTI urinary tract infection   Pertinent  Medical History     has a past medical history of Arthritis, Breast cancer ([place]), Cancer ([place]) (2004), Diabetes mellitus without complication ([place]), Hypercholesterolemia, Hypertension, Hypothyroid, and Seasonal allergies.    Micro Data:  11/16/2020: SARS-CoV-2 and influenza PCR>> negative 11/06/2020: Blood culture x2>> no growth 11/08/2020: Urine>> multiple species present (suggest recollection) 10/31/2020: Tracheal aspirate>> normal respiratory flora 11/02/2020: Strep pneumo urinary antigen>> negative   Antimicrobials:  Cefepime 6/23>>6/27 Flagyl 6/23>>6/27 Vancomycin 6/23>>6/27  MERO 6/27 --> 6/29     Significant Hospital Events: Including procedures, antibiotic start and stop dates in addition to other pertinent events  6/23- admit 6/24: Remains critically ill, on pressors (7 mcg Levophed); tracheal aspirate with gram-positive cocci in pairs, Gram + rods, Gram - rods, cultures and sensitivity still pending 6/26 remains on vent 6/26 failed SAT.SBT, PICC LINE PLACED 6/27 remains on v ent 6/28 remains on vent, pressors 6/29 severe resp failure 7/1: Remains critically ill on vent and requiring pressors, episode of A-fib w/ RVR which resolved with 150 mg Amio bolus    Interim History / Subjective:  -No acute events noted overnight -Afebrile, requiring Levophed and Vasopressin -Remains on vent (45% FiO2 and 5 PEEP) ~ will wean  FiO2 as tolerates -WBC slowly trending down to 16 (18.1 yesterday) -Mild Hyperkalemia (K+ 5.2) ~ will give dose of Lokelma -UOP 1.1 L over past 24 hrs (net + 2.5L since admit) -With episode of A-fib w/ RVR which converted to NSR with 150 mg Amiodarone bolus x1 ~ TSH normal, troponin pending  Vent Mode: PRVC FiO2 (%):  [45 %-55 %] 45 % Set Rate:  [18 bmp] 18 bmp Vt Set:  [420 mL] 420 mL PEEP:  [5 cmH20] 5 cmH20 Plateau Pressure:  [15 cmH20] 15 cmH20   Intake/Output Summary (Last 24 hours) at 11/17/2020 0908 Last data filed at 11/17/2020 4970 Gross per 24 hour  Intake 762.98 ml  Output 1875 ml  Net -1112.02 ml           Objective   Blood pressure 115/74, pulse 74, temperature 99.4 F (37.4 C), temperature source Axillary, resp. rate 18, height 5' 3"  (1.6 m), weight 74.4 kg, SpO2 96 %.    Vent Mode: PRVC FiO2 (%):  [45 %-55 %] 45 % Set Rate:  [18 bmp] 18 bmp Vt Set:  [420 mL] 420 mL PEEP:  [5 cmH20] 5 cmH20 Plateau Pressure:  [15 cmH20] 15 cmH20   Intake/Output Summary (Last 24 hours) at 11/17/2020 0908 Last data filed at 11/17/2020 2637 Gross per 24 hour  Intake 762.98 ml  Output 1875 ml  Net -1112.02 ml    Filed Weights   11/15/20 0346 11/16/20 0432 11/17/20 0500  Weight: 80.8 kg 79.9 kg 74.4 kg    REVIEW OF SYSTEMS PATIENT IS UNABLE TO PROVIDE COMPLETE REVIEW OF SYSTEMS DUE TO SEVERE CRITICAL ILLNESS AND TOXIC METABOLIC ENCEPHALOPATHY   PHYSICAL EXAMINATION:  GENERAL:critically ill appearing female, laying in bed, intubated and sedated, no acute distress HEAD: Normocephalic, atraumatic.  EYES: Pupils equal, round, reactive to light.  No scleral icterus.  MOUTH: Moist mucosal membrane, ET tube in place NECK: Supple. No thyromegaly. No nodules. No JVD.  PULMONARY: Coarse breath sounds bilaterally, no wheezing or rales noted, overbreathing the vent, even CARDIOVASCULAR: S1 and S2. Regular rate and rhythm. No murmurs, rubs, or gallops.  GASTROINTESTINAL: Soft,  nontender, -distended. Positive bowel sounds.  MUSCULOSKELETAL: No swelling, clubbing, or edema.  NEUROLOGIC: Sedated, will open eyes to voice and withdraws from pain, however would not follow commands SKIN:i warm and dry.  No obvious rashes, lesion, ulcerations    Labs/imaging that I havepersonally reviewed  (right click and "Reselect all SmartList Selections" daily)   Labs 11/17/20: K+ 5.2, glucose 297, WBC 16, Hgb 9.7  Echocardiogram 11/12/20>>1. Left ventricular ejection fraction, by estimation, is 60 to 65%. The  left ventricle has normal function. The left ventricle has no regional  wall motion abnormalities. There is mild left ventricular hypertrophy.  Left ventricular diastolic parameters  are consistent with Grade I diastolic dysfunction (impaired relaxation).   2. RVSP is 43mHg + RA pressure. Right ventricular systolic function is  normal. The right ventricular size is normal. Mildly increased right  ventricular wall thickness. There is severely elevated pulmonary artery  systolic pressure.   3. The mitral valve is grossly normal. No evidence of mitral valve  regurgitation.   4. Tricuspid valve regurgitation is mild to moderate.   5. The aortic valve is calcified. Aortic valve regurgitation is trivial.    ASSESSMENT AND PLAN   Acute Hypoxic respiratory failure secondary to aspiration pneumonia Echocardiogram on 11/12/20 shows severe Pulmonary Hypertension -Full vent support, implement lung protective strategies -Wean FiO2 & PEEP as tolerated to maintain O2 sats >92% -Follow intermittent Chest X-ray & ABG as needed -Spontaneous Breathing Trials when respiratory parameters met and mental status permits -Implement VAP Bundle -Bronchodilators and Budesonide nebs -Completed course of ABX -Severe pulmonary Hypertension will make successful liberation from ventilator difficult  Severe Sepsis due to Aspiration Pneumonia +/- UTI -Monitor fever curve -Trend WBC's -Follow  cultures as above -Completed course of ABX ~ tracheal aspirate with normal respiratory flora  Septic Shock Atrial Fibrillation w/ RVR>>resolved PMHx of Hypertension -Continuous cardiac monitoring -Maintain MAP >65 -IV fluids -Vasopressors as needed to maintain MAP goal -Continue Midodrine -Stress dose steroids -Trend lactic acid until normalized -Trend Troponin until peaked -Pt developed brief episode of A-fib w/ RVR on 75/3/64which concerted to NSR with 1680mg Amiodarone bolus x1 -TSH is normal  Acute Metabolic Encephalopathy Need for sedation in setting of Mechanical Ventilation -Maintain a RASS goal of 0 to -1 -Fentanyl and Versed to maintain RASS goal -Avoid sedating medications as able -Daily wake up assessment  Mild Hypokalemia -Monitor I&O's / urinary output -Follow BMP -Ensure adequate renal perfusion -Avoid nephrotoxic agents as able -Replace electrolytes as indicated -Will give Lokelma x1 dose 11/17/20  Diabetes Mellitus with Hyperglycemia -CBG's -SSI and Lantus -Follow ICU Hypo/Hyperglycemia protocol    Pt is critically ill, prognosis is guarded and overall long term prognosis is very poor.  High risk for cardiac arrest and death. Pt is DNR.   Best Practices:   Diet/type: tube feeds Pain/Anxiety/Delirium protocol RASS goal -1 VAP protocol (if indicated): Yes DVT prophylaxis: Lovenox GI prophylaxis: H2B Glucose control:  SSI Central venous access:  yes, PICC line still needed Arterial line:  N/A Foley:  yes, and still needed Mobility:  bed rest  PT consulted: N/A Studies pending: N/A Culture data pending: None Last  reviewed culture data:today Antibiotics: None, completed course Antibiotic de-escalation: N/A Stop date: N/A Code Status:  DNR Last date of multidisciplinary goals of care discussion [11/17/20] ccm prognosis: Life-threating Disposition: remains critically ill, will stay in intensive care   Pt's son [name] updated by Palliative Care.   He expressed plans to be in town on Saturday (11/18/20) to say his good byes with intent of allowing patient to have a natural death with one way extubation at some point.   Labs   CBC: Recent Labs  Lab 11/13/20 0426 11/14/20 0542 11/15/20 0445 11/16/20 0316 11/17/20 0458  WBC 16.5* 17.9* 14.8* 18.1* 16.0*  NEUTROABS 11.0*  --   --   --   --   HGB 12.7 11.6* 11.1* 9.9* 9.7*  HCT 37.4 35.6* 32.5* 29.9* 30.7*  MCV 91.4 93.7 93.9 94.3 98.4  PLT 222 182 208 245 248     Basic Metabolic Panel: Recent Labs  Lab 11/14/20 0542 11/14/20 1900 11/15/20 0445 11/16/20 0316 11/16/20 1948 11/17/20 0458  NA 145  --  141 143 139 141  K 3.3* 3.7 3.5 2.7* 5.0 5.2*  CL 112*  --  106 102 102 104  CO2 27  --  31 32 31 31  GLUCOSE 304*  --  310* 213* 361* 297*  BUN 24*  --  29* 37* 38* 40*  CREATININE 0.56  --  0.47 0.51 0.53 0.56  CALCIUM 7.8*  --  8.0* 8.1* 8.1* 8.4*  MG 1.9  --  1.8 1.8 1.8 1.9  PHOS 1.1* 2.1* 2.0* 1.2*  --  2.0*    GFR: Estimated Creatinine Clearance: 58.7 mL/min (by C-G formula based on SCr of 0.56 mg/dL). Recent Labs  Lab 11/11/20 0336 11/11/20 1348 11/12/20 0531 11/14/20 0542 11/15/20 0445 11/16/20 0316 11/17/20 0458  PROCALCITON 2.74  --   --   --   --   --   --   WBC 7.6  --    < > 17.9* 14.8* 18.1* 16.0*  LATICACIDVEN  --  1.0  --   --   --   --   --    < > = values in this interval not displayed.     Liver Function Tests: No results for input(s): AST, ALT, ALKPHOS, BILITOT, PROT, ALBUMIN in the last 168 hours.  No results for input(s): LIPASE, AMYLASE in the last 168 hours. No results for input(s): AMMONIA in the last 168 hours.  ABG    Component Value Date/Time   PHART 7.50 (H) 11/13/2020 1228   PCO2ART 30 (L) 11/11/2020 1228   PO2ART 97 10/20/2020 1228   HCO3 23.4 11/08/2020 1228   O2SAT 98.1 10/21/2020 1228      Coagulation Profile: No results for input(s): INR, PROTIME in the last 168 hours.  Cardiac Enzymes: No results for  input(s): CKTOTAL, CKMB, CKMBINDEX, TROPONINI in the last 168 hours.  HbA1C: Hemoglobin A1C  Date/Time Value Ref Range Status  03/26/2012 06:17 AM 6.7 (H) 4.2 - 6.3 % Final    Comment:    The American Diabetes Association recommends that a primary goal of therapy should be <7% and that physicians should reevaluate the treatment regimen in patients with HbA1c values consistently >8%.    Hgb A1c MFr Bld  Date/Time Value Ref Range Status  10/23/2020 03:54 PM 7.0 (H) 4.8 - 5.6 % Final    Comment:    (NOTE)         Prediabetes: 5.7 - 6.4  Diabetes: >6.4         Glycemic control for adults with diabetes: <7.0   07/24/2020 01:50 PM 14.2 (H) 4.8 - 5.6 % Final    Comment:    (NOTE) Pre diabetes:          5.7%-6.4%  Diabetes:              >6.4%  Glycemic control for   <7.0% adults with diabetes     CBG: Recent Labs  Lab 11/16/20 1619 11/16/20 1929 11/16/20 2306 11/17/20 0320 11/17/20 0759  GLUCAP 352* 321* 350* 248* 289*     Allergies Allergies  Allergen Reactions   Navane [Thiothixene] Other (See Comments)    Reaction:  Unknown   Penicillins Rash    Has patient had a PCN reaction causing immediate rash, facial/tongue/throat swelling, SOB or lightheadedness with hypotension: No Has patient had a PCN reaction causing severe rash involving mucus membranes or skin necrosis: No Has patient had a PCN reaction that required hospitalization: No Has patient had a PCN reaction occurring within the last 10 years: No If all of the above answers are "NO", then may proceed with Cephalosporin use.  Has patient had a PCN reaction causing immediate rash, facial/tongue/throat swelling, SOB or lightheadedness with hypotension: No Has patient had a PCN reaction causing severe rash involving mucus membranes or skin necrosis: No Has patient had a PCN reaction that required hospitalization: No Has patient had a PCN reaction occurring within the last 10 years: No If all of the  above answers are "NO", then may proceed with Cephalosporin use. Has patient had a PCN reaction causing immediate rash, facial/tongue/throat swelling, SOB or lightheadedness with hypotension: No Has patient had a PCN reaction causing severe rash involving mucus membranes or skin necrosis: No Has patient had a PCN reaction that required hospitalization: No Has patient had a PCN reaction occurring within the last 10 years: No If all of the above answers are "NO", then may proceed with Cephalosporin use.      Critical Care Time: 38 minutes   [name], AGACNP-BC [facility] Prefer epic messenger for cross cover needs If after hours, please call E-link

## 2020-11-17 NOTE — Progress Notes (Signed)
Pt remains on levo, vaso, prop, and fent.   Attempted to wean down to 35% w/o success to due desat. Remains on 45%/5.   Near 1100, pt went into afib rvr (hr 150s), Amio bolus ordered and given.   Following bolus, pt converted to NSR and then went back to AFIB. Amio gtt ordered, HOWEVER pt converted to NSR right before initiating amio gtt. Amio gtt held for now, order in place if needed.   Foley and flexi in place.   Sacral dressing changed. Santyl applied.  Turn q2. Pt in no acute distress @ this time. Will continue to monitor.

## 2020-11-17 DEATH — deceased

## 2020-11-18 ENCOUNTER — Inpatient Hospital Stay: Payer: Medicare Other

## 2020-11-18 LAB — GLUCOSE, CAPILLARY
Glucose-Capillary: 195 mg/dL — ABNORMAL HIGH (ref 70–99)
Glucose-Capillary: 204 mg/dL — ABNORMAL HIGH (ref 70–99)
Glucose-Capillary: 239 mg/dL — ABNORMAL HIGH (ref 70–99)
Glucose-Capillary: 252 mg/dL — ABNORMAL HIGH (ref 70–99)
Glucose-Capillary: 263 mg/dL — ABNORMAL HIGH (ref 70–99)
Glucose-Capillary: 264 mg/dL — ABNORMAL HIGH (ref 70–99)
Glucose-Capillary: 270 mg/dL — ABNORMAL HIGH (ref 70–99)

## 2020-11-18 LAB — BASIC METABOLIC PANEL
Anion gap: 9 (ref 5–15)
BUN: 40 mg/dL — ABNORMAL HIGH (ref 8–23)
CO2: 30 mmol/L (ref 22–32)
Calcium: 8.5 mg/dL — ABNORMAL LOW (ref 8.9–10.3)
Chloride: 100 mmol/L (ref 98–111)
Creatinine, Ser: 0.49 mg/dL (ref 0.44–1.00)
GFR, Estimated: >60 mL/min (ref >60)
Glucose, Bld: 214 mg/dL — ABNORMAL HIGH (ref 70–99)
Potassium: 4.1 mmol/L (ref 3.5–5.1)
Sodium: 139 mmol/L (ref 135–145)

## 2020-11-18 LAB — CBC
HCT: 32 % — ABNORMAL LOW (ref 36.0–46.0)
Hemoglobin: 10.2 g/dL — ABNORMAL LOW (ref 12.0–15.0)
MCH: 31.1 pg (ref 26.0–34.0)
MCHC: 31.9 g/dL (ref 30.0–36.0)
MCV: 97.6 fL (ref 80.0–100.0)
Platelets: 312 10*3/uL (ref 150–400)
RBC: 3.28 MIL/uL — ABNORMAL LOW (ref 3.87–5.11)
RDW: 15.9 % — ABNORMAL HIGH (ref 11.5–15.5)
WBC: 21.9 10*3/uL — ABNORMAL HIGH (ref 4.0–10.5)
nRBC: 1.2 % — ABNORMAL HIGH (ref 0.0–0.2)

## 2020-11-18 LAB — ALBUMIN: Albumin: 2 g/dL — ABNORMAL LOW (ref 3.5–5.0)

## 2020-11-18 LAB — MAGNESIUM: Magnesium: 2.1 mg/dL (ref 1.7–2.4)

## 2020-11-18 LAB — PHOSPHORUS: Phosphorus: 2.9 mg/dL (ref 2.5–4.6)

## 2020-11-18 IMAGING — DX DG CHEST 1V PORT
1 series · 1 of 1 positions shown · non-contrast
Comparison: [DATE]

CLINICAL DATA: Acute respiratory failure.  Hypoxia.

EXAM:
PORTABLE CHEST 1 VIEW

[chest ap]
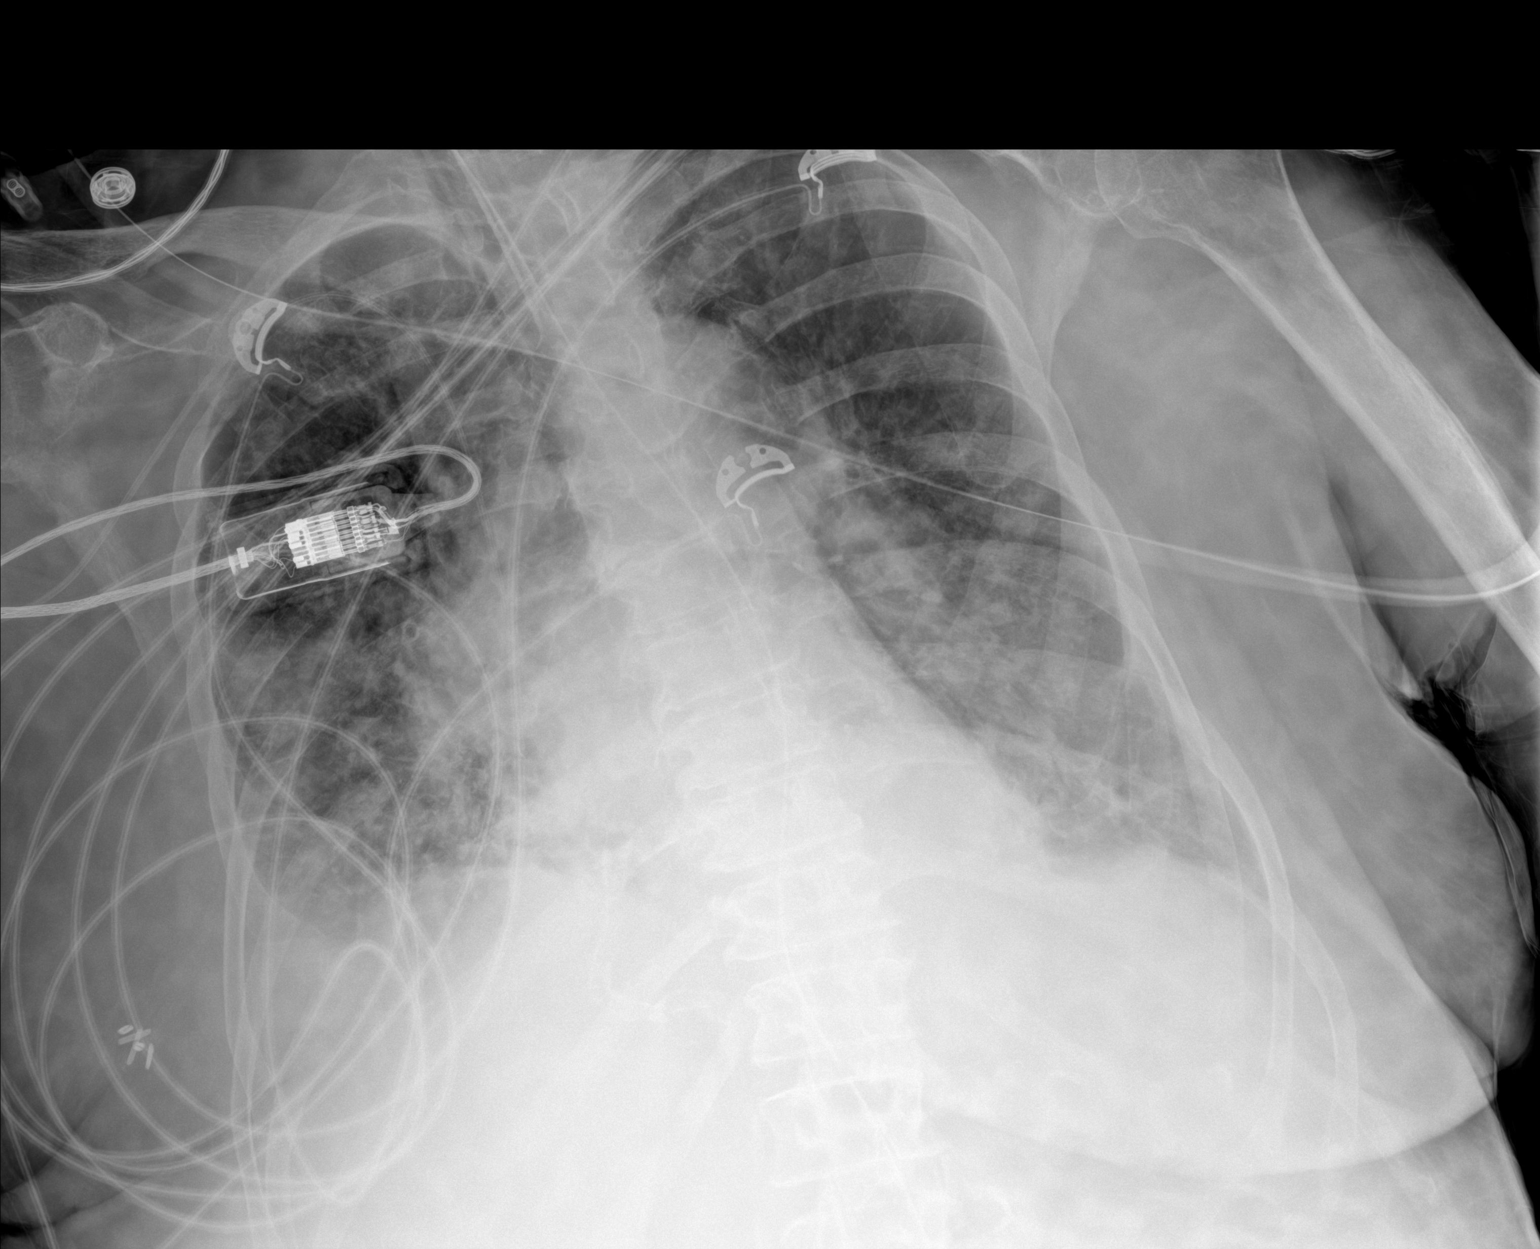

[1 of 1 positions shown; findings below may reference images not displayed]

FINDINGS: Endotracheal and enteric tubes remain unchanged in position. Right
PICC catheter with tip overlying the low SVC region. Shallow
inspiration. Heart size is normal. Bilateral perihilar and basilar
infiltrates are similar to prior study. No definite pleural effusion
or pneumothorax.
IMPRESSION: Bilateral perihilar infiltrates, similar to prior study. Appliances
are unchanged and in satisfactory appearing position.

## 2020-11-18 MED ORDER — FUROSEMIDE 10 MG/ML IJ SOLN
20.0000 mg | Freq: Once | INTRAMUSCULAR | Status: AC
  Administered 2020-11-18: 20 mg via INTRAVENOUS
  Filled 2020-11-18: qty 2

## 2020-11-18 MED ORDER — ALBUMIN HUMAN 25 % IV SOLN
25.0000 g | Freq: Once | INTRAVENOUS | Status: AC
  Administered 2020-11-18: 25 g via INTRAVENOUS
  Filled 2020-11-18: qty 100

## 2020-11-18 MED ORDER — INSULIN ASPART 100 UNIT/ML IJ SOLN
8.0000 [IU] | INTRAMUSCULAR | Status: DC
  Administered 2020-11-18 – 2020-11-19 (×5): 8 [IU] via SUBCUTANEOUS
  Filled 2020-11-18 (×4): qty 1

## 2020-11-18 NOTE — Consult Note (Signed)
[facility] for Electrolyte Monitoring and Replacement   Recent Labs: Potassium (mmol/L)  Date Value  11/18/2020 4.1  02/25/2014 4.4   Magnesium (mg/dL)  Date Value  11/18/2020 2.1   Calcium (mg/dL)  Date Value  11/18/2020 8.5 (L)   Calcium, Total (mg/dL)  Date Value  02/25/2014 9.1   Albumin (g/dL)  Date Value  10/30/2020 2.4 (L)  02/25/2014 4.0   Phosphorus (mg/dL)  Date Value  11/18/2020 2.9   Sodium (mmol/L)  Date Value  11/18/2020 139  02/25/2014 139   Corrected Ca: 9.8 mg/dL  Assessment: 75 yo female with HTN, hypothyroid, severe bipolar/depression, and diabetes admitted with likely aspiration PNA and AMS now intubated and sedated. Pharmacy has been consulted to monitor electrolytes. Since admission renal function has been stable and at apparent baseline. On 7/1 there was an episode of A-fib w/ RVR requiring amiodarone bolus  Nutrition: Vital AF at 45 mL/hr  Free Water 60 mL q4h  Juven 1 packet BID  Goal of Therapy:  Electrolytes WNL   Plan:  No electrolyte replacement warranted for today FU electrolytes with morning labs.  Vallery Sa, PharmD

## 2020-11-18 NOTE — Progress Notes (Signed)
NAME:  [name], MRN:  [id number], DOB:  June 24, 1945, LOS: 9 ADMISSION DATE:  11/15/2020 75 year old with hypertension, hypothyroidism, severe bipolar and depression, diabetes Admitted with altered mental status, intubated for respiratory protection.  Being treated for sepsis likely from aspiration pneumonia   She had multiple recent admissions this year for DKA and was hospitalized in May of this year for aspiration of hotdog which required bronchoscopy and another admission in early June of this year for UTI urinary tract infection   Pertinent  Medical History     has a past medical history of Arthritis, Breast cancer ([place]), Cancer ([place]) (2004), Diabetes mellitus without complication ([place]), Hypercholesterolemia, Hypertension, Hypothyroid, and Seasonal allergies.    Micro Data:  11/06/2020: SARS-CoV-2 and influenza PCR>> negative 10/18/2020: Blood culture x2>> no growth 11/08/2020: Urine>> multiple species present (suggest recollection) 11/06/2020: Tracheal aspirate>> normal respiratory flora 11/08/2020: Strep pneumo urinary antigen>> negative   Antimicrobials:  Cefepime 6/23>>6/27 Flagyl 6/23>>6/27 Vancomycin 6/23>>6/27  MERO 6/27 --> 6/29     Significant Hospital Events: Including procedures, antibiotic start and stop dates in addition to other pertinent events  6/23- admit 6/24: Remains critically ill, on pressors (7 mcg Levophed); tracheal aspirate with gram-positive cocci in pairs, Gram + rods, Gram - rods, cultures and sensitivity still pending 6/26 remains on vent 6/26 failed SAT.SBT, PICC LINE PLACED 6/27 remains on v ent 6/28 remains on vent, pressors 6/29 severe resp failure 07/1 Remains critically ill on vent and requiring pressors, episode of A-fib w/ RVR which resolved with 150 mg Amio bolus 07/2 remains ventilator dependent weaning pressors    Interim History / Subjective:  -No acute events noted overnight -Afebrile, requiring Levophed and  Vasopressin -Remains on vent (45% FiO2 and 5 PEEP) ~ will wean FiO2 as tolerates -WBC slowly trending down to 16 (18.1 yesterday) -Positive I/O (net + 6.7 since admit) -No recurrent A-fib w/ RVR which converted to NSR with 150 mg Amiodarone bolus x1 ~ TSH normal, troponin -troponin 64 yesterday  Vent Mode: PRVC FiO2 (%):  [35 %-45 %] 45 % Set Rate:  [18 bmp] 18 bmp Vt Set:  [420 mL] 420 mL PEEP:  [5 cmH20] 5 cmH20 Plateau Pressure:  [0 cmH20-20 cmH20] 20 cmH20   Intake/Output Summary (Last 24 hours) at 11/18/2020 0946 Last data filed at 11/18/2020 8338 Gross per 24 hour  Intake 5259.8 ml  Output 950 ml  Net 4309.8 ml   Scheduled Meds:  budesonide (PULMICORT) nebulizer solution  0.5 mg Nebulization BID   chlorhexidine gluconate (MEDLINE KIT)  15 mL Mouth Rinse BID   Chlorhexidine Gluconate Cloth  6 each Topical Daily   collagenase   Topical Daily   enoxaparin (LOVENOX) injection  40 mg Subcutaneous Q24H   feeding supplement (PROSource TF)  45 mL Per Tube BID   free water  60 mL Per Tube Q4H   hydrocortisone sod succinate (SOLU-CORTEF) inj  50 mg Intravenous Q6H   insulin aspart  0-20 Units Subcutaneous Q4H   insulin aspart  6 Units Subcutaneous Q4H   insulin glargine  15 Units Subcutaneous BID   ipratropium-albuterol  3 mL Nebulization Q4H   levothyroxine  50 mcg Per Tube Q0600   mouth rinse  15 mL Mouth Rinse 10 times per day   midodrine  10 mg Per Tube TID WC   nutrition supplement (JUVEN)  1 packet Per Tube BID BM   sodium chloride flush  10-40 mL Intracatheter Q12H   Continuous Infusions:  sodium chloride Stopped (11/15/20 2305)  amiodarone     amiodarone     famotidine (PEPCID) IV Stopped (11/17/20 2159)   feeding supplement (VITAL AF 1.2 CAL) 1,000 mL (11/18/20 0851)   fentaNYL infusion INTRAVENOUS 100 mcg/hr (11/18/20 0312)   midazolam 6 mg/hr (11/18/20 0558)   norepinephrine (LEVOPHED) 55m / 2582minfusion 6 mcg/min (11/18/20 0312)   vasopressin 0.03 Units/min  (11/18/20 0312)   PRN Meds:.acetaminophen, docusate, ibuprofen, ipratropium-albuterol, ondansetron (ZOFRAN) IV, polyethylene glycol, sodium chloride flush  Objective   Blood pressure (!) 130/93, pulse 69, temperature 98.7 F (37.1 C), resp. rate 18, height _0  (1.6 m), weight 74.4 kg, SpO2 97 %.    Vent Mode: PRVC FiO2 (%):  [35 %-45 %] 45 % Set Rate:  [18 bmp] 18 bmp Vt Set:  [420 mL] 420 mL PEEP:  [5 cmH20] 5 cmH20 Plateau Pressure:  [0 cmIWP80-99mH20] 20 cmH20   Intake/Output Summary (Last 24 hours) at 11/18/2020 0946 Last data filed at 11/18/2020 [id number] per 24 hour  Intake 5259.8 ml  Output 950 ml  Net 4309.8 ml    Filed Weights   11/15/20 0346 11/16/20 0432 11/17/20 0500  Weight: 80.8 kg 79.9 kg 74.4 kg    REVIEW OF SYSTEMS PATIENT IS UNABLE TO PROVIDE COMPLETE REVIEW OF SYSTEMS DUE TO SEVERE CRITICAL ILLNESS AND TOXIC METABOLIC ENCEPHALOPATHY   PHYSICAL EXAMINATION:  GENERAL: Acute on chronically ill appearing female, laying in bed, intubated and sedated, no acute distress HEAD: Normocephalic, atraumatic.  EYES: Pupils equal, round, reactive to light.  No scleral icterus.  There is scleral edema. MOUTH: Orotracheally intubated, OG in place. NECK: Supple. No thyromegaly. No nodules. No JVD.  PULMONARY: Coarse breath sounds bilaterally, no wheezing or rales noted, overbreathing the vent, even CARDIOVASCULAR: S1 and S2. Regular rate and rhythm. No murmurs, rubs, or gallops.  GASTROINTESTINAL: Soft, non-distended. Positive bowel sounds.  MUSCULOSKELETAL: No swelling, clubbing, or edema.  NEUROLOGIC: Sedated, will open eyes to voice and withdraws from pain, however would not follow commands SKIN:i warm and dry.  No obvious rashes, lesion, ulcerations.  Labs/imaging that I havepersonally reviewed  (right click and "Reselect all SmartList Selections" daily)   Labs 11/17/20: K+ 5.2, glucose 297, WBC 16, Hgb 9.7  Echocardiogram 11/12/20>>1. Left ventricular ejection  fraction, by estimation, is 60 to 65%. The  left ventricle has normal function. The left ventricle has no regional  wall motion abnormalities. There is mild left ventricular hypertrophy.  Left ventricular diastolic parameters  are consistent with Grade I diastolic dysfunction (impaired relaxation).   2. RVSP is 6352m + RA pressure. Right ventricular systolic function is  normal. The right ventricular size is normal. Mildly increased right  ventricular wall thickness. There is severely elevated pulmonary artery  systolic pressure.   3. The mitral valve is grossly normal. No evidence of mitral valve  regurgitation.   4. Tricuspid valve regurgitation is mild to moderate.   5. The aortic valve is calcified. Aortic valve regurgitation is trivial.   Chest x-ray today:    ASSESSMENT AND PLAN   Acute Hypoxic respiratory failure secondary to aspiration pneumonia Echocardiogram on 11/12/20 shows severe Pulmonary Hypertension -Full vent support, implement lung protective strategies -Wean FiO2 & PEEP as tolerated to maintain O2 sats >92% -Follow intermittent Chest X-ray & ABG as needed -Spontaneous Breathing Trials when respiratory parameters met and mental status permits -Continue VAP Bundle -Bronchodilators and Budesonide nebs -Completed course of ABX -Severe pulmonary hypertension will make successful liberation from ventilator difficult -Await for son to come from [place]  to discuss further GOC  Severe Sepsis due to Aspiration Pneumonia +/- UTI -Monitor fever curve -Trend WBC's -Follow cultures as above -Completed course of ABX ~ tracheal aspirate with normal respiratory flora  Septic Shock Atrial Fibrillation w/ RVR>>resolved PMHx of Hypertension -Continuous cardiac monitoring -Maintain MAP >65 -IV fluids -Vasopressors as needed to maintain MAP goal -Continue Midodrine -Stress dose steroids -Trend lactic acid until normalized -Trend Troponin, current mild elevation appears to  be demand ischemia -No A. fib with RVR recurrence -TSH is normal  Acute Metabolic Encephalopathy Need for sedation in setting of Mechanical Ventilation -Maintain a RASS goal of 0 to -1 -Fentanyl and Versed to maintain RASS goal -Avoid sedating medications as able -Daily wake up assessment  Mild Hypokalemia -Monitor I&O's / urinary output -Follow BMP -Ensure adequate renal perfusion -Avoid nephrotoxic agents as able -Replace electrolytes as indicated   Diabetes Mellitus with Hyperglycemia -CBG's -SSI and Lantus -Follow ICU Hypo/Hyperglycemia protocol  Pt is critically ill, prognosis is guarded and overall long term prognosis is very poor.  High risk for cardiac arrest and death. Pt is DNR.   Best Practices:   Diet/type: tube feeds Pain/Anxiety/Delirium protocol RASS goal -1 VAP protocol (if indicated): Yes DVT prophylaxis: Lovenox GI prophylaxis: H2B Glucose control:  SSI Central venous access:  yes, PICC line still needed Arterial line:  N/A Foley:  yes, and still needed Mobility:  bed rest  PT consulted: N/A Studies pending: N/A Culture data pending: None Last reviewed culture data: today Antibiotics: None, completed course Antibiotic de-escalation: N/A Stop date: N/A Code Status:  DNR Last date of multidisciplinary goals of care discussion [11/17/20] CCM prognosis: Life-threating Disposition: remains critically ill, will stay in intensive care   Pt's son [name] updated by Palliative Care on 7/1.  He expressed plans to be in town on Saturday (11/18/20) to say his good byes with intent of allowing patient to have a natural death with one way extubation at some point.   Labs   CBC: Recent Labs  Lab 11/13/20 0426 11/14/20 0542 11/15/20 0445 11/16/20 0316 11/17/20 0458 11/18/20 0442  WBC 16.5* 17.9* 14.8* 18.1* 16.0* 21.9*  NEUTROABS 11.0*  --   --   --   --   --   HGB 12.7 11.6* 11.1* 9.9* 9.7* 10.2*  HCT 37.4 35.6* 32.5* 29.9* 30.7* 32.0*  MCV 91.4  93.7 93.9 94.3 98.4 97.6  PLT 222 182 208 245 248 312     Basic Metabolic Panel: Recent Labs  Lab 11/14/20 1900 11/15/20 0445 11/16/20 0316 11/16/20 1948 11/17/20 0458 11/18/20 0442  NA  --  141 143 139 141 139  K 3.7 3.5 2.7* 5.0 5.2* 4.1  CL  --  106 102 102 104 100  CO2  --  31 32 _0 GLUCOSE  --  310* 213* 361* 297* 214*  BUN  --  29* 37* 38* 40* 40*  CREATININE  --  0.47 0.51 0.53 0.56 0.49  CALCIUM  --  8.0* 8.1* 8.1* 8.4* 8.5*  MG  --  1.8 1.8 1.8 1.9 2.1  PHOS 2.1* 2.0* 1.2*  --  2.0* 2.9    GFR: Estimated Creatinine Clearance: 58.7 mL/min (by C-G formula based on SCr of 0.49 mg/dL). Recent Labs  Lab 11/11/20 1348 11/12/20 0531 11/15/20 0445 11/16/20 0316 11/17/20 0458 11/18/20 0442  WBC  --    < > 14.8* 18.1* 16.0* 21.9*  LATICACIDVEN 1.0  --   --   --   --   --    < > =  values in this interval not displayed.     Liver Function Tests: No results for input(s): AST, ALT, ALKPHOS, BILITOT, PROT, ALBUMIN in the last 168 hours.  No results for input(s): LIPASE, AMYLASE in the last 168 hours. No results for input(s): AMMONIA in the last 168 hours.  ABG    Component Value Date/Time   PHART 7.50 (H) 11/16/2020 1228   PCO2ART 30 (L) 10/31/2020 1228   PO2ART 97 10/22/2020 1228   HCO3 23.4 11/04/2020 1228   O2SAT 98.1 11/05/2020 1228      Coagulation Profile: No results for input(s): INR, PROTIME in the last 168 hours.  Cardiac Enzymes: No results for input(s): CKTOTAL, CKMB, CKMBINDEX, TROPONINI in the last 168 hours.  HbA1C: Hemoglobin A1C  Date/Time Value Ref Range Status  03/26/2012 06:17 AM 6.7 (H) 4.2 - 6.3 % Final    Comment:    The American Diabetes Association recommends that a primary goal of therapy should be <7% and that physicians should reevaluate the treatment regimen in patients with HbA1c values consistently >8%.    Hgb A1c MFr Bld  Date/Time Value Ref Range Status  10/23/2020 03:54 PM 7.0 (H) 4.8 - 5.6 % Final     Comment:    (NOTE)         Prediabetes: 5.7 - 6.4         Diabetes: >6.4         Glycemic control for adults with diabetes: <7.0   07/24/2020 01:50 PM 14.2 (H) 4.8 - 5.6 % Final    Comment:    (NOTE) Pre diabetes:          5.7%-6.4%  Diabetes:              >6.4%  Glycemic control for   <7.0% adults with diabetes     CBG: Recent Labs  Lab 11/17/20 1618 11/17/20 1936 11/18/20 0009 11/18/20 0333 11/18/20 0751  GLUCAP 300* 227* 195* 204* 239*     Allergies Allergies  Allergen Reactions   Navane [Thiothixene] Other (See Comments)    Reaction:  Unknown   Penicillins Rash    Has patient had a PCN reaction causing immediate rash, facial/tongue/throat swelling, SOB or lightheadedness with hypotension: No Has patient had a PCN reaction causing severe rash involving mucus membranes or skin necrosis: No Has patient had a PCN reaction that required hospitalization: No Has patient had a PCN reaction occurring within the last 10 years: No If all of the above answers are "NO", then may proceed with Cephalosporin use.  Has patient had a PCN reaction causing immediate rash, facial/tongue/throat swelling, SOB or lightheadedness with hypotension: No Has patient had a PCN reaction causing severe rash involving mucus membranes or skin necrosis: No Has patient had a PCN reaction that required hospitalization: No Has patient had a PCN reaction occurring within the last 10 years: No If all of the above answers are "NO", then may proceed with Cephalosporin use. Has patient had a PCN reaction causing immediate rash, facial/tongue/throat swelling, SOB or lightheadedness with hypotension: No Has patient had a PCN reaction causing severe rash involving mucus membranes or skin necrosis: No Has patient had a PCN reaction that required hospitalization: No Has patient had a PCN reaction occurring within the last 10 years: No If all of the above answers are "NO", then may proceed with Cephalosporin  use.     The patient is critically ill with multiple organ systems failure and requires high complexity decision making for assessment and support,  frequent evaluation and titration of therapies, application of advanced monitoring technologies and extensive interpretation of multiple databases. Critical Care Time devoted to patient care services described in this note is  40 minutes.   Renold Don, MD  PCCM   *This note was dictated using voice recognition software/Dragon.  Despite best efforts to proofread, errors can occur which can change the meaning.  Any change was purely unintentional.

## 2020-11-19 LAB — CBC
HCT: 27.4 % — ABNORMAL LOW (ref 36.0–46.0)
Hemoglobin: 8.6 g/dL — ABNORMAL LOW (ref 12.0–15.0)
MCH: 30.7 pg (ref 26.0–34.0)
MCHC: 31.4 g/dL (ref 30.0–36.0)
MCV: 97.9 fL (ref 80.0–100.0)
Platelets: 272 10*3/uL (ref 150–400)
RBC: 2.8 MIL/uL — ABNORMAL LOW (ref 3.87–5.11)
RDW: 15.9 % — ABNORMAL HIGH (ref 11.5–15.5)
WBC: 17.2 10*3/uL — ABNORMAL HIGH (ref 4.0–10.5)
nRBC: 0.7 % — ABNORMAL HIGH (ref 0.0–0.2)

## 2020-11-19 LAB — MAGNESIUM: Magnesium: 2 mg/dL (ref 1.7–2.4)

## 2020-11-19 LAB — GLUCOSE, CAPILLARY
Glucose-Capillary: 207 mg/dL — ABNORMAL HIGH (ref 70–99)
Glucose-Capillary: 182 mg/dL — ABNORMAL HIGH (ref 70–99)
Glucose-Capillary: 141 mg/dL — ABNORMAL HIGH (ref 70–99)
Glucose-Capillary: 176 mg/dL — ABNORMAL HIGH (ref 70–99)

## 2020-11-19 LAB — BASIC METABOLIC PANEL
Anion gap: 7 (ref 5–15)
BUN: 52 mg/dL — ABNORMAL HIGH (ref 8–23)
CO2: 34 mmol/L — ABNORMAL HIGH (ref 22–32)
Calcium: 8.8 mg/dL — ABNORMAL LOW (ref 8.9–10.3)
Chloride: 98 mmol/L (ref 98–111)
Creatinine, Ser: 0.48 mg/dL (ref 0.44–1.00)
GFR, Estimated: >60 mL/min (ref >60)
Glucose, Bld: 204 mg/dL — ABNORMAL HIGH (ref 70–99)
Potassium: 3.8 mmol/L (ref 3.5–5.1)
Sodium: 139 mmol/L (ref 135–145)

## 2020-11-19 LAB — PHOSPHORUS: Phosphorus: 2.5 mg/dL (ref 2.5–4.6)

## 2020-11-19 MED ORDER — LORAZEPAM 2 MG/ML IJ SOLN: 1.0000 mg | INTRAMUSCULAR | Status: DC | PRN

## 2020-11-19 MED ORDER — INSULIN GLARGINE 100 UNIT/ML ~~LOC~~ SOLN
20.0000 [IU] | Freq: Two times a day (BID) | SUBCUTANEOUS | Status: DC
  Administered 2020-11-19: 20 [IU] via SUBCUTANEOUS
  Filled 2020-11-19 (×3): qty 0.2

## 2020-11-19 MED ORDER — MORPHINE 100MG IN NS 100ML (1MG/ML) PREMIX INFUSION
2.0000 mg/h | INTRAVENOUS | Status: DC
  Administered 2020-11-19: 2 mg/h via INTRAVENOUS
  Administered 2020-11-20: 4 mg/h via INTRAVENOUS
  Administered 2020-11-21 (×2): 6 mg/h via INTRAVENOUS
  Administered 2020-11-22: 15 mg/h via INTRAVENOUS
  Administered 2020-11-22: 9 mg/h via INTRAVENOUS
  Filled 2020-11-19 (×7): qty 100

## 2020-11-19 MED ORDER — GLYCOPYRROLATE 0.2 MG/ML IJ SOLN
0.2000 mg | INTRAMUSCULAR | Status: DC | PRN
  Administered 2020-11-19 – 2020-11-23 (×7): 0.2 mg via INTRAVENOUS
  Filled 2020-11-19 (×10): qty 1

## 2020-11-19 MED ORDER — FUROSEMIDE 10 MG/ML IJ SOLN: 20.0000 mg | Freq: Once | INTRAMUSCULAR | Status: DC

## 2020-11-19 MED ORDER — HALOPERIDOL LACTATE 5 MG/ML IJ SOLN: 2.0000 mg | Freq: Four times a day (QID) | INTRAMUSCULAR | Status: DC | PRN

## 2020-11-19 MED ORDER — ALBUMIN HUMAN 25 % IV SOLN: 25.0000 g | Freq: Three times a day (TID) | INTRAVENOUS | Status: DC

## 2020-11-19 NOTE — Progress Notes (Signed)
Ms. Salatino was extubated to room air at 1638 per MD order.  Sallye Lat, RN at bedside with this RT.  Pt is resting comfortably at this time.

## 2020-11-19 NOTE — Progress Notes (Signed)
Patient's son, [name], and his wife, [name], was briefly at the bedside visiting with this patient to "say final goodbyes". [name] states it has been "many years since he had seen his mother but wanted the opportunity to see her & pray over her" before allowing her to have a natural death. [name] confirmed that he wishes to proceed with compassionate extubation and comfort care measures only, but he does not intend to stay at the bedside. [name] states the he does not have the financial means to make arrangements as his mother's passing and would need assistance from DSS.

## 2020-11-19 NOTE — Progress Notes (Signed)
Chaplain called to provide presence for patient after extubation. I stayed present in the room, close to patient. I believe she was aware of my presence. I offered presence and prayer with patient.

## 2020-11-19 NOTE — Progress Notes (Addendum)
Goals of care discussion/end-of-life issues discussion:  Patient's son [name] arrived from [place] with his wife,[name].  They both visited [name].  Despite being estranged from her son [name] actually had requested that he make final decisions for her if she were to be incapacitated.  This is documented on prior Palliative Care notes.  Mild come visited with his mother, he wants to honor her Bennett of not being maintained on life support indefinitely.  Reiterates no tracheostomy and no gastrostomy.  This is in accordance to the patient's prior expressed Bennett to Palliative Care.  [name] desires that [name] be transitioned to comfort care.  He also expressed the desire that he did not want to remain present.  He wanted to say his goodbyes and stated that the visit gave him some closure.  [name] was raised mostly in foster care due to his mother's issues with psychiatric illness while he was growing up.  Despite all of this he has wanted for his mother to be taken care of.  He states that his sister does not want anything to do with the mother.  The patient has remained critically ill and made no progress in 10 days hospitalization.  She has continued to dependent on pressors.  Chances for meaningful recovery are slim.  We will proceed with transitioning to comfort care as per [name]' Bennett.  [name] does note that he is unable to pay for funeral arrangements.  This will have to be arranged through DSS.  We will transition the patient to comfort care.  She is currently DNR.  Cindy Don, MD  PCCM   *This note was dictated using voice recognition software/Dragon.  Despite best efforts to proofread, errors can occur which can change the meaning.  Any change was purely unintentional.

## 2020-11-19 NOTE — Progress Notes (Signed)
NAME:  [name], MRN:  [id number], DOB:  05-Aug-1945, LOS: 52 ADMISSION DATE:  10/29/2020 75 year old with hypertension, hypothyroidism, severe bipolar and depression, diabetes Admitted with altered mental status, intubated for respiratory protection.  Being treated for sepsis likely from aspiration pneumonia   She had multiple recent admissions this year for DKA and was hospitalized in May of this year for aspiration of hotdog which required bronchoscopy and another admission in early June of this year for UTI urinary tract infection  Recurrent aspiration episodes, multiple hospital admissions, adult failure to thrive   Micro Data:  11/11/2020: SARS-CoV-2 and influenza PCR>> negative 10/28/2020: Blood culture x2>> no growth 11/08/2020: Urine>> multiple species present (suggest recollection) 10/30/2020: Tracheal aspirate>> normal respiratory flora 11/05/2020: Strep pneumo urinary antigen>> negative   Antimicrobials:  Cefepime 6/23>>6/27 Flagyl 6/23>>6/27 Vancomycin 6/23>>6/27  MERO 6/27 --> 6/29     Significant Hospital Events: Including procedures, antibiotic start and stop dates in addition to other pertinent events  6/23- admit 6/24: Remains critically ill, on pressors (7 mcg Levophed); tracheal aspirate with gram-positive cocci in pairs, Gram + rods, Gram - rods, cultures and sensitivity still pending 6/26 remains on vent 6/26 failed SAT.SBT, PICC LINE PLACED 6/27 remains on v ent 6/28 remains on vent, pressors 6/29 severe resp failure 07/01 Remains critically ill on vent and requiring pressors, episode of A-fib w/ RVR which resolved with 150 mg Amio bolus 07/02 remains ventilator dependent weaning pressors 07/03 remains on the ventilator not able to wean.  Continues to be pressor dependent, no family present    Interim History / Subjective:  -No acute events noted overnight -Afebrile, requiring Levophed and Vasopressin -Remains on vent (45% FiO2 and 5 PEEP) ~  will wean FiO2 as tolerates -WBC slowly trending down to 16 (18.1 yesterday) -Positive I/O (net + 7.8 L  since admit) -No recurrent A-fib w/ RVR which converted to NSR with 150 mg Amiodarone bolus x1 ~ TSH normal, troponin -troponin 64 yesterday  Vent Mode: PRVC FiO2 (%):  [45 %] 45 % Set Rate:  [18 bmp] 18 bmp Vt Set:  [420 mL] 420 mL PEEP:  [5 cmH20] 5 cmH20 Plateau Pressure:  [13 cmH20-20 cmH20] 13 cmH20   Intake/Output Summary (Last 24 hours) at 11/19/2020 0803 Last data filed at 11/19/2020 0650 Gross per 24 hour  Intake 2769.06 ml  Output 1875 ml  Net 894.06 ml   Scheduled Meds:  budesonide (PULMICORT) nebulizer solution  0.5 mg Nebulization BID   chlorhexidine gluconate (MEDLINE KIT)  15 mL Mouth Rinse BID   Chlorhexidine Gluconate Cloth  6 each Topical Daily   collagenase   Topical Daily   enoxaparin (LOVENOX) injection  40 mg Subcutaneous Q24H   feeding supplement (PROSource TF)  45 mL Per Tube BID   free water  60 mL Per Tube Q4H   hydrocortisone sod succinate (SOLU-CORTEF) inj  50 mg Intravenous Q6H   insulin aspart  0-20 Units Subcutaneous Q4H   insulin aspart  8 Units Subcutaneous Q4H   insulin glargine  20 Units Subcutaneous BID   ipratropium-albuterol  3 mL Nebulization Q4H   levothyroxine  50 mcg Per Tube Q0600   mouth rinse  15 mL Mouth Rinse 10 times per day   midodrine  10 mg Per Tube TID WC   nutrition supplement (JUVEN)  1 packet Per Tube BID BM   sodium chloride flush  10-40 mL Intracatheter Q12H   Continuous Infusions:  sodium chloride Stopped (11/15/20 2305)   amiodarone  amiodarone     famotidine (PEPCID) IV Stopped (11/18/20 2244)   feeding supplement (VITAL AF 1.2 CAL) 1,000 mL (11/18/20 0851)   fentaNYL infusion INTRAVENOUS 100 mcg/hr (11/19/20 0650)   midazolam 4 mg/hr (11/19/20 0713)   norepinephrine (LEVOPHED) 57m / 2549minfusion 2 mcg/min (11/19/20 0650)   vasopressin 0.03 Units/min (11/18/20 2127)   PRN Meds:.acetaminophen, docusate,  ibuprofen, ipratropium-albuterol, ondansetron (ZOFRAN) IV, polyethylene glycol, sodium chloride flush  Objective   Blood pressure 112/74, pulse 67, temperature 98.3 F (36.8 C), temperature source Bladder, resp. rate 15, height _0  (1.6 m), weight 74.2 kg, SpO2 97 %.    Vent Mode: PRVC FiO2 (%):  [45 %] 45 % Set Rate:  [18 bmp] 18 bmp Vt Set:  [420 mL] 420 mL PEEP:  [5 cmH20] 5 cmH20 Plateau Pressure:  [13 cmH20-20 cmH20] 13 cmH20   Intake/Output Summary (Last 24 hours) at 11/19/2020 0803 Last data filed at 11/19/2020 060355ross per 24 hour  Intake 2769.06 ml  Output 1875 ml  Net 894.06 ml    Filed Weights   11/16/20 0432 11/17/20 0500 11/19/20 0500  Weight: 79.9 kg 74.4 kg 74.2 kg    REVIEW OF SYSTEMS PATIENT IS UNABLE TO PROVIDE COMPLETE REVIEW OF SYSTEMS DUE TO SEVERE CRITICAL ILLNESS AND TOXIC METABOLIC ENCEPHALOPATHY   PHYSICAL EXAMINATION:  GENERAL: Acute on chronically ill appearing female, laying in bed, intubated and sedated, no acute distress, pale HEAD: Normocephalic, atraumatic.  EYES: Pupils equal, round, reactive to light.  No scleral icterus.  There is scleral edema. MOUTH: Orotracheally intubated, OG in place. NECK: Supple. No thyromegaly.  Trachea midline. No JVD.  PULMONARY: Coarse breath sounds bilaterally, no wheezing or rales noted, overbreathing the vent, even CARDIOVASCULAR: S1 and S2. Regular rate and rhythm. No murmurs, rubs, or gallops.  GASTROINTESTINAL: Soft, non-distended. Positive bowel sounds.  MUSCULOSKELETAL: No swelling, clubbing, or edema.  NEUROLOGIC: Sedated, will open eyes to voice and withdraws from pain, however would not follow commands SKIN: warm and dry.  No obvious rashes, lesion, ulcerations.  Labs/imaging that I havepersonally reviewed  (right click and "Reselect all SmartList Selections" daily)   Labs 11/17/20: K+ 5.2, glucose 297, WBC 16, Hgb 9.7  Echocardiogram 11/12/20>>1. Left ventricular ejection fraction, by estimation,  is 60 to 65%. The  left ventricle has normal function. The left ventricle has no regional  wall motion abnormalities. There is mild left ventricular hypertrophy.  Left ventricular diastolic parameters  are consistent with Grade I diastolic dysfunction (impaired relaxation).   2. RVSP is 6333m + RA pressure. Right ventricular systolic function is  normal. The right ventricular size is normal. Mildly increased right  ventricular wall thickness. There is severely elevated pulmonary artery  systolic pressure.   3. The mitral valve is grossly normal. No evidence of mitral valve  regurgitation.   4. Tricuspid valve regurgitation is mild to moderate.   5. The aortic valve is calcified. Aortic valve regurgitation is trivial.    ASSESSMENT AND PLAN   Acute Hypoxic respiratory failure secondary to aspiration pneumonia Echocardiogram on 11/12/20 shows severe Pulmonary Hypertension Full vent support, implement lung protective strategies Wean FiO2 & PEEP as tolerated to maintain O2 sats >92% Follow intermittent Chest X-ray & ABG as needed Spontaneous Breathing Trials when respiratory parameters met and mental status permits Has not tolerated SBT's so far Continue VAP Bundle Bronchodilators and Budesonide nebs Completed course of ABX Severe pulmonary hypertension will make successful liberation from ventilator difficult Await for son to come from [place]  discuss further GOC, has not been available for discussion  Severe Sepsis due to Aspiration Pneumonia +/- UTI -Monitor fever curve -Trend WBC's -Follow cultures as above -Completed course of ABX ~ tracheal aspirate with normal respiratory flora  Septic Shock, persistent shock physiology is poor prognosis Atrial Fibrillation w/ RVR>>resolved PMHx of Hypertension -Continuous cardiac monitoring -Maintain MAP >65 -IV fluids -Vasopressors as needed to maintain MAP goal -Continue Midodrine -Stress dose steroids -Trend lactic acid until  normalized -Trend Troponin, current mild elevation appears to be demand ischemia -No A. fib with RVR recurrence -TSH is normal  Acute Metabolic Encephalopathy Need for sedation in setting of Mechanical Ventilation -Maintain a RASS goal of 0 to -1 -Fentanyl and Versed to maintain RASS goal -Avoid sedating medications as able -Daily wake up assessment  Acute kidney injury Mild Hypokalemia -corrected -Monitor I&O's / urinary output -Follow BMP -Ensure adequate renal perfusion -Avoid nephrotoxic agents as able -Replace electrolytes as indicated   Diabetes Mellitus with Hyperglycemia -CBG's -SSI and Lantus -Adjusted Lantus -Follow ICU Hypo/Hyperglycemia protocol  Severe protein calorie malnutrition Nutrition Status: Nutrition Problem: Inadequate oral intake Etiology: inability to eat (pt sedated and ventilated) Signs/Symptoms: NPO status tube feeds in progress   Pt is critically ill, prognosis is guarded and overall long term prognosis is very poor.  High risk for cardiac arrest and death. Pt is DNR.   Best Practices:   Diet/type: tube feeds Pain/Anxiety/Delirium protocol RASS goal -1 VAP protocol (if indicated): Yes DVT prophylaxis: Lovenox GI prophylaxis: H2B Glucose control:  SSI Central venous access:  yes, PICC line still needed Arterial line:  N/A Foley:  yes, and still needed Mobility:  bed rest  PT consulted: N/A Studies pending: N/A Culture data pending: None Last reviewed culture data: today Antibiotics: None, completed course Antibiotic de-escalation: N/A Stop date: N/A Code Status:  DNR Last date of multidisciplinary goals of care discussion [11/17/20], son has not presented to discuss  CCM prognosis: Life-threating, poor long-term prognosis Disposition: remains critically ill, will stay in intensive care   Pt's son [name] updated by Palliative Care on 7/1.  He expressed plans to be in town on Saturday (11/18/20) to say his good byes with intent of  allowing patient to have a natural death with one way extubation at some point.  As of 11/19/2020,  son has not presented to bedside nor has he called to discuss potential transition to comfort measures.   Labs   CBC: Recent Labs  Lab 11/13/20 0426 11/14/20 0542 11/15/20 0445 11/16/20 0316 11/17/20 0458 11/18/20 0442 11/19/20 0500  WBC 16.5*   < > 14.8* 18.1* 16.0* 21.9* 17.2*  NEUTROABS 11.0*  --   --   --   --   --   --   HGB 12.7   < > 11.1* 9.9* 9.7* 10.2* 8.6*  HCT 37.4   < > 32.5* 29.9* 30.7* 32.0* 27.4*  MCV 91.4   < > 93.9 94.3 98.4 97.6 97.9  PLT 222   < > 208 245 248 312 272   < > = values in this interval not displayed.     Basic Metabolic Panel: Recent Labs  Lab 11/15/20 0445 11/16/20 0316 11/16/20 1948 11/17/20 0458 11/18/20 0442 11/19/20 0500  NA 141 143 139 141 139 139  K 3.5 2.7* 5.0 5.2* 4.1 3.8  CL 106 102 102 104 100 98  CO2 31 32 _0 34*  GLUCOSE 310* 213* 361* 297* 214* 204*  BUN 29* 37* 38* 40* 40* 52*  CREATININE 0.47 0.51 0.53 0.56 0.49 0.48  CALCIUM 8.0* 8.1* 8.1* 8.4* 8.5* 8.8*  MG 1.8 1.8 1.8 1.9 2.1 2.0  PHOS 2.0* 1.2*  --  2.0* 2.9 2.5    GFR: Estimated Creatinine Clearance: 58.6 mL/min (by C-G formula based on SCr of 0.48 mg/dL). Recent Labs  Lab 11/16/20 0316 11/17/20 0458 11/18/20 0442 11/19/20 0500  WBC 18.1* 16.0* 21.9* 17.2*     Liver Function Tests: Recent Labs  Lab 11/18/20 0442  ALBUMIN 2.0*    No results for input(s): LIPASE, AMYLASE in the last 168 hours. No results for input(s): AMMONIA in the last 168 hours.  ABG    Component Value Date/Time   PHART 7.50 (H) 10/20/2020 1228   PCO2ART 30 (L) 11/02/2020 1228   PO2ART 97 11/07/2020 1228   HCO3 23.4 10/29/2020 1228   O2SAT 98.1 10/24/2020 1228      Coagulation Profile: No results for input(s): INR, PROTIME in the last 168 hours.  Cardiac Enzymes: No results for input(s): CKTOTAL, CKMB, CKMBINDEX, TROPONINI in the last 168  hours.  HbA1C: Hemoglobin A1C  Date/Time Value Ref Range Status  03/26/2012 06:17 AM 6.7 (H) 4.2 - 6.3 % Final    Comment:    The American Diabetes Association recommends that a primary goal of therapy should be <7% and that physicians should reevaluate the treatment regimen in patients with HbA1c values consistently >8%.    Hgb A1c MFr Bld  Date/Time Value Ref Range Status  10/23/2020 03:54 PM 7.0 (H) 4.8 - 5.6 % Final    Comment:    (NOTE)         Prediabetes: 5.7 - 6.4         Diabetes: >6.4         Glycemic control for adults with diabetes: <7.0   07/24/2020 01:50 PM 14.2 (H) 4.8 - 5.6 % Final    Comment:    (NOTE) Pre diabetes:          5.7%-6.4%  Diabetes:              >6.4%  Glycemic control for   <7.0% adults with diabetes     CBG: Recent Labs  Lab 11/18/20 1607 11/18/20 1941 11/18/20 2333 11/19/20 0359 11/19/20 0742  GLUCAP 270* 263* 264* 207* 182*     Allergies Allergies  Allergen Reactions   Navane [Thiothixene] Other (See Comments)    Reaction:  Unknown   Penicillins Rash    Has patient had a PCN reaction causing immediate rash, facial/tongue/throat swelling, SOB or lightheadedness with hypotension: No Has patient had a PCN reaction causing severe rash involving mucus membranes or skin necrosis: No Has patient had a PCN reaction that required hospitalization: No Has patient had a PCN reaction occurring within the last 10 years: No If all of the above answers are "NO", then may proceed with Cephalosporin use.  Has patient had a PCN reaction causing immediate rash, facial/tongue/throat swelling, SOB or lightheadedness with hypotension: No Has patient had a PCN reaction causing severe rash involving mucus membranes or skin necrosis: No Has patient had a PCN reaction that required hospitalization: No Has patient had a PCN reaction occurring within the last 10 years: No If all of the above answers are "NO", then may proceed with Cephalosporin  use. Has patient had a PCN reaction causing immediate rash, facial/tongue/throat swelling, SOB or lightheadedness with hypotension: No Has patient had a PCN reaction causing severe rash involving mucus membranes or skin necrosis: No Has patient  had a PCN reaction that required hospitalization: No Has patient had a PCN reaction occurring within the last 10 years: No If all of the above answers are "NO", then may proceed with Cephalosporin use.     The patient is critically ill with multiple organ systems failure and requires high complexity decision making for assessment and support, frequent evaluation and titration of therapies, application of advanced monitoring technologies and extensive interpretation of multiple databases. Critical Care Time devoted to patient care services described in this note is  40 minutes.   Renold Don, MD [facility]   *This note was dictated using voice recognition software/Dragon.  Despite best efforts to proofread, errors can occur which can change the meaning.  Any change was purely unintentional.

## 2020-11-19 NOTE — Consult Note (Signed)
[facility] for Electrolyte Monitoring and Replacement   Recent Labs: Potassium (mmol/L)  Date Value  11/19/2020 3.8  02/25/2014 4.4   Magnesium (mg/dL)  Date Value  11/19/2020 2.0   Calcium (mg/dL)  Date Value  11/19/2020 8.8 (L)   Calcium, Total (mg/dL)  Date Value  02/25/2014 9.1   Albumin (g/dL)  Date Value  11/18/2020 2.0 (L)  02/25/2014 4.0   Phosphorus (mg/dL)  Date Value  11/19/2020 2.5   Sodium (mmol/L)  Date Value  11/19/2020 139  02/25/2014 139   Assessment: 75 yo female with HTN, hypothyroid, severe bipolar/depression, and diabetes admitted with likely aspiration PNA and AMS now intubated and sedated. Pharmacy has been consulted to monitor electrolytes. Since admission renal function has been stable and at apparent baseline. On 7/1 there was an episode of A-fib w/ RVR requiring amiodarone bolus  Nutrition: Vital AF at 45 mL/hr  Free Water 60 mL q4h  Juven 1 packet BID  Goal of Therapy:  Electrolytes WNL   Plan:  No electrolyte replacement warranted for today FU electrolytes with morning labs  Benita Gutter  11/19/20

## 2020-11-20 DIAGNOSIS — G9341 Metabolic encephalopathy: Secondary | ICD-10-CM

## 2020-11-20 DIAGNOSIS — E43 Unspecified severe protein-calorie malnutrition: Secondary | ICD-10-CM

## 2020-11-20 DIAGNOSIS — A419 Sepsis, unspecified organism: Secondary | ICD-10-CM

## 2020-11-20 DIAGNOSIS — I4891 Unspecified atrial fibrillation: Secondary | ICD-10-CM

## 2020-11-20 DIAGNOSIS — Z66 Do not resuscitate: Secondary | ICD-10-CM

## 2020-11-20 DIAGNOSIS — Z9911 Dependence on respirator [ventilator] status: Secondary | ICD-10-CM

## 2020-11-20 DIAGNOSIS — I272 Pulmonary hypertension, unspecified: Secondary | ICD-10-CM

## 2020-11-20 NOTE — Progress Notes (Signed)
Goals of care FAMILY Reiterates no tracheostomy and no gastrostomy.  This is in accordance to the patient's prior expressed wishes to Palliative Care.   patient is  comfort care.  She is currently DNR/DNI  Transfer to [facility]

## 2020-11-20 NOTE — Progress Notes (Addendum)
   Daily Progress Note   Patient Name: [name]       Date: 11/20/2020 DOB: 12-08-1945  Age: 75 y.o. MRN#: [id number] Attending Physician: Flora Lipps, MD Primary Care Physician: Mechele Claude, FNP Admit Date: 11/03/2020  Reason for Consultation/Follow-up: Establishing goals of care  Subjective: Chart Reviewed. Patient Assessed.  RN reports no concerns - no s/s of pain or distress. Patient unresponsive. No family at bedside.   Exam: Opens eyes to voice, no interaction. Extremities warm to touch. Breathing unlabored. Morphine infusion at 4 mg/hr.   Length of Stay: 11 days  Vital Signs: BP 132/66   Pulse (!) 58   Temp 98.7 F (37.1 C) (Bladder)   Resp (!) 9   Ht 5\' 3"  (1.6 m)   Wt 74.2 kg   SpO2 (!) 81%   BMI 28.98 kg/m  SpO2: SpO2: (!) 81 % O2 Device: O2 Device: Room Air O2 Flow Rate:    Palliative Care Assessment & Plan  HPI: Palliative Care consult requested for goals of care discussion in this 75 y.o. female with a medical history significant for hypothyroidism, diabetes, bipolar, depression, hypothyroidism, arthritis, and breast cancer. She presented to the ED via EMS from [facility] facility after being found unresponsive with respiratory distress. Oxygen saturations were 65% and went up to 86% on 15L nonrebreather. Patient have had multiple admissions this year. She was admitted on 4/29 secondary to respiratory failure due to aspiration on a hot dog. At that time she required intubation and bronchoscopy to clear food particles. On arrival patient required intubation for airway protection. She is requiring multiple pressors. Tracheal aspirate positive for gram cocci in pairs and gram + rods. She is receiving IV antibiotics. Remains on full ventilator support and unresponsive.   Code Status: DNR  Goals of Care/Recommendations: Continue comfort measures only, anticipate hospital death  Prognosis: hours- days  Thank you for allowing the Palliative  Medicine Team to assist in the care of this patient.  Time Total: 15 min  Visit consisted of counseling and education dealing with the complex and emotionally intense issues of symptom management and palliative care in the setting of serious and potentially life-threatening illness.Greater than 50%  of this time was spent counseling and coordinating care related to the above assessment and plan.  Juel Burrow, DNP, AGNP-C Palliative Medicine Team Team Phone # [phone]  Pager # [phone]

## 2020-11-21 DIAGNOSIS — Z515 Encounter for palliative care: Secondary | ICD-10-CM

## 2020-11-21 DIAGNOSIS — G9341 Metabolic encephalopathy: Secondary | ICD-10-CM

## 2020-11-21 DIAGNOSIS — Z9911 Dependence on respirator [ventilator] status: Secondary | ICD-10-CM

## 2020-11-21 MED ORDER — MORPHINE SULFATE (CONCENTRATE) 10 MG /0.5 ML PO SOLN: 5.0000 mg | ORAL | 0 refills | Status: AC | PRN

## 2020-11-21 MED ORDER — LORAZEPAM 0.5 MG PO TABS: 0.5000 mg | ORAL_TABLET | Freq: Three times a day (TID) | ORAL | 0 refills | Status: AC | PRN

## 2020-11-21 NOTE — Progress Notes (Signed)
Nutrition Brief Note  Chart reviewed. Pt now transitioning to comfort care.  No further nutrition interventions planned at this time.  Please re-consult as needed.   Juvia Aerts, RD, LDN Clinical Dietitian Pager on Am[facility]  

## 2020-11-21 NOTE — TOC Transition Note (Signed)
Transition of Care [facility]) - CM/SW Discharge Note   Patient Details  Name: [name] MRN: [id number] Date of Birth: [birth year]  [facility] CM/SW Contact:  Kerin Salen, RN Phone Number: 11/21/2020, 1:01 PM   Clinical Narrative:   Patient to discharge to [facility] via [facility]. Son notified by Hospice staff.    Final next level of care: [facility]     Patient Goals and CMS Choice        Discharge Placement                Patient to be transferred to facility by: [facility] Name of family member notified: Palliative notified Son Patient and family notified of of transfer: 11/21/20  Discharge Plan and Services                                     Social Determinants of Health ([place]) Interventions     Readmission Risk Interventions Readmission Risk Prevention Plan 08/24/2020  PCP or Specialist Appt within 5-7 Days Complete  Medication Review (RN CM) Complete  Some recent data might be hidden

## 2020-11-21 NOTE — Progress Notes (Signed)
Comfort care patient. Disoriented x 4 and unable to follow commands. Patient has not moaned or groaned in pain so no increase on morphine has been needed. Patient is currently sleeping and looks to be comfortable. Patient opens her eyes to voice and is able to look at me when I am speaking to her but unable to respond to any commands or questions. Will continue comfort measures for this patient.

## 2020-11-21 NOTE — Discharge Summary (Deleted)
[facility] NAME: [name]    MR#:  [id number]  DATE OF BIRTH:  [birth year]  DATE OF ADMISSION:  10/24/2020   ADMITTING PHYSICIAN: Marshell Garfinkel, MD  DATE OF DISCHARGE: 11/21/2020  PRIMARY CARE PHYSICIAN: Mechele Claude, FNP   ADMISSION DIAGNOSIS:  Acute respiratory failure with hypoxia (HCC) [J96.01] Aspiration pneumonia of both lungs, unspecified aspiration pneumonia type, unspecified part of lung ([place]) [J69.0] Sepsis, due to unspecified organism, unspecified whether acute organ dysfunction present ([place]) [A41.9] DISCHARGE DIAGNOSIS:  Active Problems:   Bipolar I disorder, current or most recent episode manic, severe with mixed features ([place])   Hypothyroidism   Type 2 diabetes mellitus with hyperlipidemia ([place])   AKI (acute kidney injury) ([place])   Aspiration pneumonia ([place])   Acute respiratory failure with hypoxia ([place])   Difficulty liberating from mechanical ventilation (HCC)   Atrial fibrillation with rapid ventricular response (HCC)   Severe sepsis with septic shock (CODE) ([place])   Acute metabolic encephalopathy   Severe pulmonary hypertension ([place])   Severe protein-calorie malnutrition ([place])   Hospice care patient  SECONDARY DIAGNOSIS:   Past Medical History:  Diagnosis Date   Arthritis    Breast cancer ([place])    Cancer ([place]) 2004   right breast ca   Diabetes mellitus without complication (HCC)    Hypercholesterolemia    Hypertension    Hypothyroid    Seasonal allergies    HOSPITAL COURSE:  75 year old female with hypertension, hypothyroidism, severe bipolar and depression, diabetes, acquired hypothyroidism, dyslipidemia, history of endotracheal intubation, history of respiratory failure readmitted with altered mental status, was intubated for respiratory protection and treated for sepsis likely from aspiration pneumonia. She has hadd multiple recent admissions this year for DKA and was hospitalized in May of this year for  aspiration of hotdog which required bronchoscopy and another admission in early June of this year for urinary tract infection  Acute Hypoxic respiratory failure secondary to aspiration pneumonia Echocardiogram on 11/12/20 shows severe Pulmonary Hypertension  Septic shock due to Aspiration Pneumonia  Atrial Fibrillation w/ RVR>>resolved Acute Metabolic Encephalopathy Hypokalemia ID Diabetes Mellitus Hyperlipidemia Anxiety/depression/bipolar disorder Hypothyroidism Stage II sacral pressure injury present on admission:  Pressure Injury 11/11/2020 Heel Left;Medial Deep Tissue Pressure Injury - Purple or maroon localized area of discolored intact skin or blood-filled blister due to damage of underlying soft tissue from pressure and/or shear. very large boggy blister (Active)  10/24/2020 1800  Location: Heel  Location Orientation: Left;Medial  Staging: Deep Tissue Pressure Injury - Purple or maroon localized area of discolored intact skin or blood-filled blister due to damage of underlying soft tissue from pressure and/or shear.  Wound Description (Comments): very large boggy blister  Present on Admission:      Pressure Injury 11/10/20 Toe (Comment  which one) Left (Active)  11/10/20 0800  Location: Toe (Comment  which one) (great toe)  Location Orientation: Left  Staging:   Wound Description (Comments):   Present on Admission:    Patient/family chose comfort care/hospice and being D/C to [facility]. DISCHARGE CONDITIONS:  critical CONSULTS OBTAINED:   DRUG ALLERGIES:   Allergies  Allergen Reactions   Navane [Thiothixene] Other (See Comments)    Reaction:  Unknown   Penicillins Rash    Has patient had a PCN reaction causing immediate rash, facial/tongue/throat swelling, SOB or lightheadedness with hypotension: No Has patient had a PCN reaction causing severe rash involving mucus membranes or skin necrosis: No Has patient had a PCN reaction that  required hospitalization: No Has  patient had a PCN reaction occurring within the last 10 years: No If all of the above answers are "NO", then may proceed with Cephalosporin use.  Has patient had a PCN reaction causing immediate rash, facial/tongue/throat swelling, SOB or lightheadedness with hypotension: No Has patient had a PCN reaction causing severe rash involving mucus membranes or skin necrosis: No Has patient had a PCN reaction that required hospitalization: No Has patient had a PCN reaction occurring within the last 10 years: No If all of the above answers are "NO", then may proceed with Cephalosporin use. Has patient had a PCN reaction causing immediate rash, facial/tongue/throat swelling, SOB or lightheadedness with hypotension: No Has patient had a PCN reaction causing severe rash involving mucus membranes or skin necrosis: No Has patient had a PCN reaction that required hospitalization: No Has patient had a PCN reaction occurring within the last 10 years: No If all of the above answers are "NO", then may proceed with Cephalosporin use.   DISCHARGE MEDICATIONS:   Allergies as of 11/21/2020       Reactions   Navane [thiothixene] Other (See Comments)   Reaction:  Unknown   Penicillins Rash   Has patient had a PCN reaction causing immediate rash, facial/tongue/throat swelling, SOB or lightheadedness with hypotension: No Has patient had a PCN reaction causing severe rash involving mucus membranes or skin necrosis: No Has patient had a PCN reaction that required hospitalization: No Has patient had a PCN reaction occurring within the last 10 years: No If all of the above answers are "NO", then may proceed with Cephalosporin use. Has patient had a PCN reaction causing immediate rash, facial/tongue/throat swelling, SOB or lightheadedness with hypotension: No Has patient had a PCN reaction causing severe rash involving mucus membranes or skin necrosis: No Has patient had a PCN reaction that required hospitalization:  No Has patient had a PCN reaction occurring within the last 10 years: No If all of the above answers are "NO", then may proceed with Cephalosporin use. Has patient had a PCN reaction causing immediate rash, facial/tongue/throat swelling, SOB or lightheadedness with hypotension: No Has patient had a PCN reaction causing severe rash involving mucus membranes or skin necrosis: No Has patient had a PCN reaction that required hospitalization: No Has patient had a PCN reaction occurring within the last 10 years: No If all of the above answers are "NO", then may proceed with Cephalosporin use.        Medication List     STOP taking these medications    acetaminophen 500 MG tablet Commonly known as: TYLENOL   clonazePAM 1 MG tablet Commonly known as: KLONOPIN   divalproex 500 MG 24 hr tablet Commonly known as: DEPAKOTE ER   enalapril 10 MG tablet Commonly known as: VASOTEC   FLUoxetine 20 MG capsule Commonly known as: PROZAC   Levemir FlexTouch 100 UNIT/ML FlexPen Generic drug: insulin detemir   levothyroxine 88 MCG tablet Commonly known as: SYNTHROID   linagliptin 5 MG Tabs tablet Commonly known as: TRADJENTA   lurasidone 20 MG Tabs tablet Commonly known as: LATUDA   metFORMIN 500 MG tablet Commonly known as: GLUCOPHAGE   ondansetron 4 MG disintegrating tablet Commonly known as: ZOFRAN-ODT   rosuvastatin 40 MG tablet Commonly known as: CRESTOR       TAKE these medications    LORazepam 0.5 MG tablet Commonly known as: Ativan Take 1 tablet (0.5 mg total) by mouth every 8 (eight) hours as needed for anxiety.  morphine CONCENTRATE 10 mg / 0.5 ml concentrated solution Take 0.25 mLs (5 mg total) by mouth every 2 (two) hours as needed for severe pain.       DISCHARGE INSTRUCTIONS:   DIET:  Pleasure feeds DISCHARGE CONDITION:  Critical ACTIVITY:  Activity as tolerated OXYGEN:  Home Oxygen: No.  Oxygen Delivery: room air DISCHARGE LOCATION:  Hospice  Home   If you experience worsening of your admission symptoms, develop shortness of breath, life threatening emergency, suicidal or homicidal thoughts you must seek medical attention immediately by calling 911 or calling your MD immediately  if symptoms less severe.  You Must read complete instructions/literature along with all the possible adverse reactions/side effects for all the Medicines you take and that have been prescribed to you. Take any new Medicines after you have completely understood and accpet all the possible adverse reactions/side effects.   Please note  You were cared for by a hospitalist during your hospital stay. If you have any questions about your discharge medications or the care you received while you were in the hospital after you are discharged, you can call the unit and asked to speak with the hospitalist on call if the hospitalist that took care of you is not available. Once you are discharged, your primary care physician will handle any further medical issues. Please note that NO REFILLS for any discharge medications will be authorized once you are discharged, as it is imperative that you return to your primary care physician (or establish a relationship with a primary care physician if you do not have one) for your aftercare needs so that they can reassess your need for medications and monitor your lab values.    On the day of Discharge:  VITAL SIGNS:  Blood pressure 132/66, pulse 71, temperature 98.7 F (37.1 C), temperature source Bladder, resp. rate 13, height 5\' 3"  (1.6 m), weight 74.2 kg, SpO2 (!) 56 %. PHYSICAL EXAMINATION:  GENERAL:  75 y.o.-year-old patient who is actively dying. Resp: chyne stokes breathing. Cardio: s1,s2 normal Neuro: obtunded Abd: benign DATA REVIEW:   CBC Recent Labs  Lab 11/19/20 0500  WBC 17.2*  HGB 8.6*  HCT 27.4*  PLT 272    Chemistries  Recent Labs  Lab 11/19/20 0500  NA 139  K 3.8  CL 98  CO2 34*  GLUCOSE 204*   BUN 52*  CREATININE 0.48  CALCIUM 8.8*  MG 2.0     Outpatient follow-up   30 Day Unplanned Readmission Risk Score    Flowsheet Row ED to Hosp-Admission (Current) from 11/11/2020 in [facility] ICU/CCU  30 Day Unplanned Readmission Risk Score (%) 38.2 Filed at 11/21/2020 1200       This score is the patient's risk of an unplanned readmission within 30 days of being discharged (0 -100%). The score is based on dignosis, age, lab data, medications, orders, and past utilization.   Low:  0-14.9   Medium: 15-21.9   High: 22-29.9   Extreme: 30 and above           Management plans discussed with the patient, family and they are in agreement.  CODE STATUS: DNR   TOTAL TIME TAKING CARE OF THIS PATIENT: 45 minutes.    Max Sane M.D on 11/21/2020 at 1:38 PM  [facility]   CC: Primary care physician; Mechele Claude, FNP   Note: This dictation was prepared with Dragon dictation along with smaller phrase technology. Any transcriptional errors that result from this process are  unintentional.

## 2020-11-21 NOTE — Progress Notes (Signed)
PROGRESS NOTE    [name]  IFO:[id number] DOB: 1945/11/08 DOA: 11/05/2020 PCP: Mechele Claude, FNP   Brief Narrative:  [name] is a 75 y.o. female with medical history significant for bipolar 1 with history of moderate mania, hypertension, acquired hypothyroid, dyslipidemia, depression, history of endotracheal intubation, history of respiratory failure, history of acute kidney injury, insulin-dependent diabetes mellitus, on metformin, presents to the emergency department from facility for chief concerns of altered mentation. She has had multiple recent admissions this year for DKA and was hospitalized in May of this year for aspiration of hotdog which required bronchoscopy and another admission in early June of this year for urinary tract infection  She was under ICU care.  After she was made comfort care she is transferred to [facility] service on 7/5.  Patient had a bed at hospice home but son did not sign the consent form in timely fashion for transfer to occur today  Subjective: Actively dying Assessment & Plan:   Active Problems:   Bipolar I disorder, current or most recent episode manic, severe with mixed features ([place])   Hypothyroidism   Type 2 diabetes mellitus with hyperlipidemia ([place])   AKI (acute kidney injury) ([place])   Aspiration pneumonia ([place])   Acute respiratory failure with hypoxia (HCC)   Difficulty liberating from mechanical ventilation (HCC)   Atrial fibrillation with rapid ventricular response (HCC)   Severe sepsis with septic shock (CODE) (HCC)   Acute metabolic encephalopathy   Severe pulmonary hypertension (HCC)   Severe protein-calorie malnutrition ([place])   Hospice care patient  Acute Hypoxic respiratory failure secondary to aspiration pneumonia Echocardiogram on 11/12/20 shows severe Pulmonary Hypertension Septic shock due to Aspiration Pneumonia  Atrial Fibrillation w/ RVR>>resolved Acute Metabolic Encephalopathy Hypokalemia ID  Diabetes Mellitus Hyperlipidemia Anxiety/depression/bipolar disorder Hypothyroidism Stage II sacral pressure injury present on admission:  Pressure Injury 10/25/2020 Heel Left;Medial Deep Tissue Pressure Injury - Purple or maroon localized area of discolored intact skin or blood-filled blister due to damage of underlying soft tissue from pressure and/or shear. very large boggy blister (Active)  10/19/2020 1800  Location: Heel  Location Orientation: Left;Medial  Staging: Deep Tissue Pressure Injury - Purple or maroon localized area of discolored intact skin or blood-filled blister due to damage of underlying soft tissue from pressure and/or shear.  Wound Description (Comments): very large boggy blister  Present on Admission:     Pressure Injury 11/10/20 Toe (Comment  which one) Left (Active)  11/10/20 0800  Location: Toe (Comment  which one) (great toe)  Location Orientation: Left  Staging:  Wound Description (Comments):  Present on Admission:     Son did not come today to sign the consent to transfer her to hospice home the discharge had to be canceled even though there was bed available for her  Objective: Vitals:   11/20/20 2100 11/21/20 0700 11/21/20 1100 11/21/20 1500  BP:      Pulse: 67 71 74 68  Resp: 17 13 16 12   Temp:      TempSrc:      SpO2: (!) 51% (!) 56% (!) 58% (!) 61%  Weight:      Height:        Intake/Output Summary (Last 24 hours) at 11/21/2020 1729 Last data filed at 11/21/2020 1300 Gross per 24 hour  Intake 133.2 ml  Output 1075 ml  Net -941.8 ml   Filed Weights   11/16/20 0432 11/17/20 0500 11/19/20 0500  Weight: 79.9 kg 74.4 kg 74.2 kg  Examination:  General exam: Chronically ill-appearing elderly lady, appears to be actively dying Respiratory system: Cheyne-Stokes respiration Cardiovascular system: S1 & S2 heard, RRR.  Gastrointestinal system: Soft, nontender, nondistended, bowel sounds positive. Central nervous system: Obtunded  DVT  prophylaxis: Comfort care Code Status: DNR Family Communication: Discussed with Del. tried calling son but he did not answer Disposition Plan: -Waiting for son to sign consent for transfer to hospice home but he did not come to get the paper signed today so despite bed availability at hospice home we could not discharge her as she is actively dying  .Status is: Inpatient  Remains inpatient appropriate because:Unsafe d/c plan   Dispo: The patient is from: SNF              Anticipated d/c is to:  Hospice home              Patient currently is not medically stable to d/c.  Actively dying   Difficult to place patient No son did not come timely to sign consent for transfer to hospice home today   All the records are reviewed and case discussed with Care Management/Social Worker. Management plans discussed with the patient, nursing and they are in agreement.  Consultants:  Palliative care  Procedures:  Antimicrobials:  Ceftriaxone  Data Reviewed: I have personally reviewed following labs and imaging studies  CBC: Recent Labs  Lab 11/15/20 0445 11/16/20 0316 11/17/20 0458 11/18/20 0442 11/19/20 0500  WBC 14.8* 18.1* 16.0* 21.9* 17.2*  HGB 11.1* 9.9* 9.7* 10.2* 8.6*  HCT 32.5* 29.9* 30.7* 32.0* 27.4*  MCV 93.9 94.3 98.4 97.6 97.9  PLT 208 245 248 312 673   Basic Metabolic Panel: Recent Labs  Lab 11/15/20 0445 11/16/20 0316 11/16/20 1948 11/17/20 0458 11/18/20 0442 11/19/20 0500  NA 141 143 139 141 139 139  K 3.5 2.7* 5.0 5.2* 4.1 3.8  CL 106 102 102 104 100 98  CO2 31 32 31 31 30  34*  GLUCOSE 310* 213* 361* 297* 214* 204*  BUN 29* 37* 38* 40* 40* 52*  CREATININE 0.47 0.51 0.53 0.56 0.49 0.48  CALCIUM 8.0* 8.1* 8.1* 8.4* 8.5* 8.8*  MG 1.8 1.8 1.8 1.9 2.1 2.0  PHOS 2.0* 1.2*  --  2.0* 2.9 2.5   GFR: Estimated Creatinine Clearance: 58.6 mL/min (by C-G formula based on SCr of 0.48 mg/dL). Liver Function Tests: Recent Labs  Lab 11/18/20 0442  ALBUMIN 2.0*   No  results for input(s): LIPASE, AMYLASE in the last 168 hours. No results for input(s): AMMONIA in the last 168 hours. Coagulation Profile: No results for input(s): INR, PROTIME in the last 168 hours. Cardiac Enzymes: No results for input(s): CKTOTAL, CKMB, CKMBINDEX, TROPONINI in the last 168 hours. BNP (last 3 results) No results for input(s): PROBNP in the last 8760 hours. HbA1C: No results for input(s): HGBA1C in the last 72 hours.  CBG: Recent Labs  Lab 11/18/20 2333 11/19/20 0359 11/19/20 0742 11/19/20 1143 11/19/20 1527  GLUCAP 264* 207* 182* 141* 176*   Lipid Profile: No results for input(s): CHOL, HDL, LDLCALC, TRIG, CHOLHDL, LDLDIRECT in the last 72 hours. Thyroid Function Tests: No results for input(s): TSH, T4TOTAL, FREET4, T3FREE, THYROIDAB in the last 72 hours. Anemia Panel: No results for input(s): VITAMINB12, FOLATE, FERRITIN, TIBC, IRON, RETICCTPCT in the last 72 hours. Sepsis Labs: No results for input(s): PROCALCITON, LATICACIDVEN in the last 168 hours.   No results found for this or any previous visit (from the past 240 hour(s)).  Radiology Studies: No results found.   Scheduled Meds:  collagenase   Topical Daily   Continuous Infusions:  sodium chloride Stopped (11/15/20 2305)   morphine 6 mg/hr (11/21/20 1300)     LOS: 12 days   Time spent: 40 minutes More than 50% of the time was spent in counseling/coordination of care  Max Sane, MD [facility]  If 7PM-7AM, please contact night-coverage [email or web address]  11/21/2020, 5:29 PM   This record has been created using Systems analyst. Errors have been sought and corrected,but may not always be located. Such creation errors do not reflect on the standard of care.

## 2020-11-21 NOTE — Progress Notes (Addendum)
[facility] Room ICU [facility] Liaison RN note  Received referral from Dr. Max Sane for transfer of patient to [facility]. Chart reviewed. Phone call made to son, [name] to acknowledge referral and explain services.   [name] agreeable to transfer today. Consents are being completed electronically with Air traffic controller. Once completed, we will arrange for transport requesting a 5pm pickup time.  Addendum 4pm: [name] not answering calls at this time and has not completed consents. Reached out to friend [name] to ask if she would sign, she declines at this time as she does not feel comfortable. Transport to [facility] cancelled. Will re-evaluate tomorrow.  RN please call report to [phone] prior to patient leaving the unit.  Please call with any hospice related questions or concerns.  Thank you, Margaretmary Eddy, BSN, RN [facility] Liaison [phone]

## 2020-11-22 DIAGNOSIS — N179 Acute kidney failure, unspecified: Secondary | ICD-10-CM

## 2020-11-22 MED ORDER — ACETAMINOPHEN 650 MG RE SUPP: 650.0000 mg | RECTAL | Status: DC | PRN

## 2020-11-22 NOTE — Progress Notes (Signed)
   Daily Progress Note   Patient Name: [name]       Date: 11/22/2020 DOB: 1945/08/19  Age: 75 y.o. MRN#: [id number] Attending Physician: Max Sane, MD Primary Care Physician: Mechele Claude, FNP Admit Date: 11/03/2020  Reason for Consultation/Follow-up: Establishing goals of care  Subjective: Chart Reviewed. Patient Assessed. No family at bedside.  RN reports needs for increase in morphine infusion d/t respiratory distress. Also has required robinul.   Exam: Unresponsive. Extremities warm to touch. Breathing unlabored. Respiratory secretions noted. Morphine infusion at 13 mg/hr.   Length of Stay: 13 days  Vital Signs: BP 119/72 (BP Location: Left Arm)   Pulse (!) 104   Temp 99.8 F (37.7 C) (Bladder)   Resp 13   Ht 5\' 3"  (1.6 m)   Wt 74.2 kg   SpO2 (!) 64%   BMI 28.98 kg/m  SpO2: SpO2: (!) 64 % O2 Device: O2 Device: Room Air O2 Flow Rate:    Palliative Care Assessment & Plan  HPI: Palliative Care consult requested for goals of care discussion in this 75 y.o. female with a medical history significant for hypothyroidism, diabetes, bipolar, depression, hypothyroidism, arthritis, and breast cancer. She presented to the ED via EMS from [facility] facility after being found unresponsive with respiratory distress. Oxygen saturations were 65% and went up to 86% on 15L nonrebreather. Patient have had multiple admissions this year. She was admitted on 4/29 secondary to respiratory failure due to aspiration on a hot dog. At that time she required intubation and bronchoscopy to clear food particles. On arrival patient required intubation for airway protection. She is requiring multiple pressors. Tracheal aspirate positive for gram cocci in pairs and gram + rods. She is receiving IV antibiotics. Remains on full ventilator support and unresponsive.   Code Status: DNR  Goals of Care/Recommendations: Continue comfort measures only, anticipate hospital  death  Prognosis: hours- days  Thank you for allowing the Palliative Medicine Team to assist in the care of this patient.  Time Total: 15 min  Greater than 50%  of this time was spent counseling and coordinating care related to the above assessment and plan.   Juel Burrow, DNP, AGNP-C Palliative Medicine Team Team Phone # [phone]  Pager # [phone]

## 2020-11-22 NOTE — Progress Notes (Signed)
Report called to Lisabeth Pick, RN on 2C. Patient will be moved to room 203.

## 2020-11-22 NOTE — Progress Notes (Signed)
Morphine drip has been titrated up several times for signs of dyspnea and discomfort. Patient continues to open eyes to voice.

## 2020-11-22 NOTE — Progress Notes (Signed)
PROGRESS NOTE    [name]  XTG:[id number] DOB: Nov 08, 1945 DOA: 10/25/2020 PCP: Mechele Claude, FNP   Brief Narrative:  [name] is a 75 y.o. female with medical history significant for bipolar 1 with history of moderate mania, hypertension, acquired hypothyroid, dyslipidemia, depression, history of endotracheal intubation, history of respiratory failure, history of acute kidney injury, insulin-dependent diabetes mellitus, on metformin, presents to the emergency department from facility for chief concerns of altered mentation. She has had multiple recent admissions this year for DKA and was hospitalized in May of this year for aspiration of hotdog which required bronchoscopy and another admission in early June of this year for urinary tract infection  She was under ICU care.  After she was made comfort care she is transferred to [facility] service on 7/5.  Patient had a bed at hospice home but son did not sign the consent form in timely fashion for transfer to occur today  Subjective: Actively dying Assessment & Plan:   Active Problems:   Bipolar I disorder, current or most recent episode manic, severe with mixed features ([place])   Hypothyroidism   Type 2 diabetes mellitus with hyperlipidemia ([place])   AKI (acute kidney injury) ([place])   Aspiration pneumonia ([place])   Acute respiratory failure with hypoxia (HCC)   Difficulty liberating from mechanical ventilation (HCC)   Atrial fibrillation with rapid ventricular response (HCC)   Severe sepsis with septic shock (CODE) (HCC)   Acute metabolic encephalopathy   Severe pulmonary hypertension (HCC)   Severe protein-calorie malnutrition ([place])   Hospice care patient  Acute Hypoxic respiratory failure secondary to aspiration pneumonia Echocardiogram on 11/12/20 shows severe Pulmonary Hypertension Septic shock due to Aspiration Pneumonia  Atrial Fibrillation w/ RVR>>resolved Acute Metabolic Encephalopathy Hypokalemia ID  Diabetes Mellitus Hyperlipidemia Anxiety/depression/bipolar disorder Hypothyroidism Stage II sacral pressure injury present on admission:  Pressure Injury 10/25/2020 Heel Left;Medial Deep Tissue Pressure Injury - Purple or maroon localized area of discolored intact skin or blood-filled blister due to damage of underlying soft tissue from pressure and/or shear. very large boggy blister (Active)  10/29/2020 1800  Location: Heel  Location Orientation: Left;Medial  Staging: Deep Tissue Pressure Injury - Purple or maroon localized area of discolored intact skin or blood-filled blister due to damage of underlying soft tissue from pressure and/or shear.  Wound Description (Comments): very large boggy blister  Present on Admission:     Pressure Injury 11/10/20 Toe (Comment  which one) Left (Active)  11/10/20 0800  Location: Toe (Comment  which one) (great toe)  Location Orientation: Left  Staging:  Wound Description (Comments):  Present on Admission:     No more hospice home bed available.  Anticipate hospital death.  Palliative care following for symptom management.  Patient is on morphine infusion  Objective: Vitals:   11/22/20 0820 11/22/20 0823 11/22/20 1230 11/22/20 1600  BP: 119/72     Pulse: 89  (!) 104 (!) 102  Resp: 15  13 11   Temp:  99.1 F (37.3 C) 99.8 F (37.7 C) 99.9 F (37.7 C)  TempSrc:  Bladder Bladder Bladder  SpO2: (!) 73%  (!) 64% (!) 58%  Weight:      Height:        Intake/Output Summary (Last 24 hours) at 11/22/2020 1740 Last data filed at 11/22/2020 1727 Gross per 24 hour  Intake 193.62 ml  Output 950 ml  Net -756.38 ml   Filed Weights   11/16/20 0432 11/17/20 0500 11/19/20 0500  Weight: 79.9 kg 74.4  kg 74.2 kg    Examination:  General exam: Chronically ill-appearing elderly lady, appears to be actively dying Respiratory system: Cheyne-Stokes respiration Cardiovascular system: S1 & S2 heard, RRR.  Gastrointestinal system: Soft, nontender, nondistended,  bowel sounds positive. Central nervous system: Obtunded  DVT prophylaxis: Comfort care Code Status: DNR Family Communication: Discussed with Del. tried calling son but he did not answer Disposition Plan: -Waiting for son to sign consent for transfer to hospice home but he did not come to get the paper signed today so despite bed availability at hospice home we could not discharge her as she is actively dying  .Status is: Inpatient  Remains inpatient appropriate because:Unsafe d/c plan   Dispo: The patient is from: SNF              Anticipated d/c is to:  Hospice home              Patient currently is not medically stable to d/c.  Actively dying   Difficult to place patient No son did not come timely to sign consent for transfer to hospice home today   All the records are reviewed and case discussed with Care Management/Social Worker. Management plans discussed with the patient, nursing and they are in agreement.  Consultants:  Palliative care  Procedures:  Antimicrobials:  Ceftriaxone  Data Reviewed: I have personally reviewed following labs and imaging studies  CBC: Recent Labs  Lab 11/16/20 0316 11/17/20 0458 11/18/20 0442 11/19/20 0500  WBC 18.1* 16.0* 21.9* 17.2*  HGB 9.9* 9.7* 10.2* 8.6*  HCT 29.9* 30.7* 32.0* 27.4*  MCV 94.3 98.4 97.6 97.9  PLT 245 248 312 712   Basic Metabolic Panel: Recent Labs  Lab 11/16/20 0316 11/16/20 1948 11/17/20 0458 11/18/20 0442 11/19/20 0500  NA 143 139 141 139 139  K 2.7* 5.0 5.2* 4.1 3.8  CL 102 102 104 100 98  CO2 32 31 31 30  34*  GLUCOSE 213* 361* 297* 214* 204*  BUN 37* 38* 40* 40* 52*  CREATININE 0.51 0.53 0.56 0.49 0.48  CALCIUM 8.1* 8.1* 8.4* 8.5* 8.8*  MG 1.8 1.8 1.9 2.1 2.0  PHOS 1.2*  --  2.0* 2.9 2.5   GFR: Estimated Creatinine Clearance: 58.6 mL/min (by C-G formula based on SCr of 0.48 mg/dL). Liver Function Tests: Recent Labs  Lab 11/18/20 0442  ALBUMIN 2.0*   No results for input(s): LIPASE,  AMYLASE in the last 168 hours. No results for input(s): AMMONIA in the last 168 hours. Coagulation Profile: No results for input(s): INR, PROTIME in the last 168 hours. Cardiac Enzymes: No results for input(s): CKTOTAL, CKMB, CKMBINDEX, TROPONINI in the last 168 hours. BNP (last 3 results) No results for input(s): PROBNP in the last 8760 hours. HbA1C: No results for input(s): HGBA1C in the last 72 hours.  CBG: Recent Labs  Lab 11/18/20 2333 11/19/20 0359 11/19/20 0742 11/19/20 1143 11/19/20 1527  GLUCAP 264* 207* 182* 141* 176*   Lipid Profile: No results for input(s): CHOL, HDL, LDLCALC, TRIG, CHOLHDL, LDLDIRECT in the last 72 hours. Thyroid Function Tests: No results for input(s): TSH, T4TOTAL, FREET4, T3FREE, THYROIDAB in the last 72 hours. Anemia Panel: No results for input(s): VITAMINB12, FOLATE, FERRITIN, TIBC, IRON, RETICCTPCT in the last 72 hours. Sepsis Labs: No results for input(s): PROCALCITON, LATICACIDVEN in the last 168 hours.   No results found for this or any previous visit (from the past 240 hour(s)).    Radiology Studies: No results found.   Scheduled Meds:  collagenase  Topical Daily   Continuous Infusions:  sodium chloride Stopped (11/15/20 2305)   morphine 13 mg/hr (11/22/20 1600)     LOS: 13 days   Time spent: 10 minutes More than 50% of the time was spent in counseling/coordination of care  Max Sane, MD [facility]  If 7PM-7AM, please contact night-coverage [email or web address]  11/22/2020, 5:40 PM   This record has been created using Systems analyst. Errors have been sought and corrected,but may not always be located. Such creation errors do not reflect on the standard of care.

## 2020-11-22 NOTE — Progress Notes (Signed)
Patient transferred to unit via ICU bed.  Patient comfort care.  O2 sats 74% on RA. Gurgling noted to throat. Morphine rate verified.  No acute distress noted.  Will continue to monitor and report off to oncoming shift.

## 2020-11-22 NOTE — Progress Notes (Signed)
Patient moved to room 203 on 2C. Patient has slightly labored breathing with gurgling sound noted to throat. Oral and subglottic suction performed prior to transfer from ICU along with oral care. New Morphine bag hung and rate increased due to slight labored breathing for comfort and Robinol given prior to transfer as well. Lazarus Gowda, RN at bedside in new room.

## 2020-11-22 NOTE — Progress Notes (Signed)
Called phone number listed in chart for patient's son [name] and left a voicemail for him asking him to call back regarding moving his Mom. Left phone number for 2C.

## 2020-11-23 MED ORDER — MORPHINE 100MG IN NS 100ML (1MG/ML) PREMIX INFUSION
2.0000 mg/h | INTRAVENOUS | Status: DC
Start: 2020-11-23 — End: 2020-11-23

## 2020-11-23 MED ORDER — MORPHINE 100MG IN NS 100ML (1MG/ML) PREMIX INFUSION
2.0000 mg/h | INTRAVENOUS | Status: DC
  Administered 2020-11-23: 15 mg/h via INTRAVENOUS
  Filled 2020-11-23 (×2): qty 100

## 2020-12-18 NOTE — Progress Notes (Signed)
Patient deceased at 61, verified by 2 nurses and pronounced.  MD and charge nurse made aware.  Patient son called, daughter in law made aware.  States at this time they do not have a funeral home in mind. States they were told that patient would be buried by the county/state.  Family in [place] at this time, per family patient's friend/caregiver (Del) can be notified and may see patient prior to going to morgue.  Friend notified and is on her way. Supervisor and charge nurse made aware.

## 2020-12-18 NOTE — Death Summary Note (Signed)
DEATH SUMMARY   Patient Details  Name: [name] MRN: [id number] DOB: 1945-10-27  Admission/Discharge Information   Admit Date:  11/19/2020  Date of Death: Date of Death: 12-07-2020  Time of Death: Time of Death: 08-19-1020  Length of Stay: 2022/08/15  Referring Physician: Mechele Claude, FNP   Reason(s) for Hospitalization  sob Diagnoses  Preliminary cause of death: Sepsis due to aspiration pneumonia Secondary Diagnoses (including complications and co-morbidities):  Active Problems:   Bipolar I disorder, current or most recent episode manic, severe with mixed features (HCC)   Hypothyroidism   Type 2 diabetes mellitus with hyperlipidemia (HCC)   AKI (acute kidney injury) ([place])   Aspiration pneumonia (HCC)   Acute respiratory failure with hypoxia (HCC)   Difficulty liberating from mechanical ventilation (HCC)   Atrial fibrillation with rapid ventricular response (HCC)   Severe sepsis with septic shock (CODE) (HCC)   Acute metabolic encephalopathy   Severe pulmonary hypertension (HCC)   Severe protein-calorie malnutrition ([place])   Hospice care patient   Brief Hospital Course (including significant findings, care, treatment, and services provided and events leading to death)  [name] is a 75 y.o. year old female with hypertension, hypothyroidism, severe bipolar and depression, diabetes, acquired hypothyroidism, dyslipidemia, history of endotracheal intubation, history of respiratory failure readmitted with altered mental status, was intubated for respiratory protection and treated for sepsis likely from aspiration pneumonia. She has hadd multiple recent admissions this year for DKA and was hospitalized in May of this year for aspiration of hotdog which required bronchoscopy and another admission in early June of this year for urinary tract infection   Acute Hypoxic respiratory failure secondary to aspiration pneumonia Echocardiogram on 11/12/20 shows severe Pulmonary  Hypertension  Septic shock due to Aspiration Pneumonia  Atrial Fibrillation w/ RVR>>resolved Acute Metabolic Encephalopathy Hypokalemia ID Diabetes Mellitus Hyperlipidemia Anxiety/depression/bipolar disorder Hypothyroidism Stage II sacral pressure injury present on admission:  Pressure Injury Nov 23, 2020 Heel Left;Medial Deep Tissue Pressure Injury - Purple or maroon localized area of discolored intact skin or blood-filled blister due to damage of underlying soft tissue from pressure and/or shear. very large boggy blister (Active)  November 23, 2020 1800  Location: Heel  Location Orientation: Left;Medial  Staging: Deep Tissue Pressure Injury - Purple or maroon localized area of discolored intact skin or blood-filled blister due to damage of underlying soft tissue from pressure and/or shear.  Wound Description (Comments): very large boggy blister  Present on Admission:     Pressure Injury 11/10/20 Toe (Comment  which one) Left (Active)  11/10/20 0800  Location: Toe (Comment  which one) (great toe)  Location Orientation: Left  Staging:  Wound Description (Comments):  Present on Admission:     Patient/family chose comfort care and was actively dying. Passed away at 10:22 am. Pertinent Labs and Studies  Significant Diagnostic Studies DG Abd 1 View  Result Date: 11/28/2020 CLINICAL DATA:  OG tube placement. EXAM: ABDOMEN - 1 VIEW COMPARISON:  None. FINDINGS: Tip and side port of the enteric tube below the diaphragm in the stomach. Nonobstructive visualized bowel gas pattern. Right upper quadrant surgical clips consistent with cholecystectomy. IMPRESSION: Tip and side port of the enteric tube below the diaphragm in the stomach. Electronically Signed   By: Keith Rake M.D.   On: 12/08/2020 19:44   CT Head Wo Contrast  Result Date: 23-Nov-2020 CLINICAL DATA:  Altered mental status, decreased responsiveness EXAM: CT HEAD WITHOUT CONTRAST TECHNIQUE: Contiguous axial images were obtained from the  base of the skull through  the vertex without intravenous contrast. COMPARISON:  10/23/2020 FINDINGS: Brain: No evidence of acute infarction, hemorrhage, hydrocephalus, extra-axial collection or mass lesion/mass effect. Moderate low-density changes within the periventricular and subcortical white matter compatible with chronic microvascular ischemic change. Mild diffuse cerebral volume loss. Vascular: Atherosclerotic calcifications involving the large vessels of the skull base. No unexpected hyperdense vessel. Skull: Normal. Negative for fracture or focal lesion. Sinuses/Orbits: No acute finding. Other: None. IMPRESSION: 1. No acute intracranial findings. 2. Chronic microvascular ischemic change and cerebral volume loss. Electronically Signed   By: Davina Poke D.O.   On: 11/16/2020 14:41   CT Angio Chest PE W and/or Wo Contrast  Result Date: 10/22/2020 CLINICAL DATA:  Unresponsive.  Responsive to painful stimuli. EXAM: CT ANGIOGRAPHY CHEST WITH CONTRAST TECHNIQUE: Multidetector CT imaging of the chest was performed using the standard protocol during bolus administration of intravenous contrast. Multiplanar CT image reconstructions and MIPs were obtained to evaluate the vascular anatomy. CONTRAST:  5mL OMNIPAQUE IOHEXOL 350 MG/ML SOLN COMPARISON:  Chest radiograph of earlier today. No prior CT. FINDINGS: Cardiovascular: The quality of this exam for evaluation of pulmonary embolism is good. No evidence of pulmonary embolism. Aortic atherosclerosis. Normal heart size, without pericardial effusion. Multivessel coronary artery atherosclerosis. Pulmonary artery enlargement, outflow tract 3.5 cm Mediastinum/Nodes: No mediastinal or hilar adenopathy. Nasogastric tube terminates at the proximal stomach. Lungs/Pleura: No pleural fluid. Endotracheal tube terminates above the carina. Material within both endobronchial trees to the lower lobes including on 50/8 and 43/8. Moderate centrilobular emphysema. Dense  consolidation within both dependent lower lobes. Upper Abdomen: Cholecystectomy. Normal imaged portions of the liver, spleen, stomach, adrenal glands, kidneys. Musculoskeletal: Right sided lumpectomy clips. Remote anterior lower left rib fractures. Review of the MIP images confirms the above findings. IMPRESSION: 1. No evidence of pulmonary embolism. 2. Dense bilateral lower lobe consolidation, combined with material in both endobronchial trees, highly suspicious for aspiration. Infection felt less likely. 3. Pulmonary artery enlargement suggests pulmonary arterial hypertension. 4. Aortic Atherosclerosis (ICD10-I70.0) and Emphysema (ICD10-J43.9). Coronary artery atherosclerosis. Electronically Signed   By: Abigail Miyamoto M.D.   On: 10/28/2020 14:48   DG Chest Port 1 View  Result Date: 11/18/2020 CLINICAL DATA:  Acute respiratory failure.  Hypoxia. EXAM: PORTABLE CHEST 1 VIEW COMPARISON:  11/14/2020 FINDINGS: Endotracheal and enteric tubes remain unchanged in position. Right PICC catheter with tip overlying the low SVC region. Shallow inspiration. Heart size is normal. Bilateral perihilar and basilar infiltrates are similar to prior study. No definite pleural effusion or pneumothorax. IMPRESSION: Bilateral perihilar infiltrates, similar to prior study. Appliances are unchanged and in satisfactory appearing position. Electronically Signed   By: Lucienne Capers M.D.   On: 11/18/2020 03:48   DG Chest Port 1 View  Result Date: 11/14/2020 CLINICAL DATA:  Acute respiratory failure EXAM: PORTABLE CHEST 1 VIEW COMPARISON:  11/10/2020 FINDINGS: Endotracheal tube seen 3.2 cm above the carina. Nasogastric tube extends into the upper abdomen beyond the margin of the examination. Right upper extremity PICC line tip noted within the superior vena cava. Pulmonary insufflation is stable. Bibasilar consolidation is again noted, stable. No pneumothorax. Small left pleural effusion. Cardiac size is within normal limits. No acute  bone abnormality. IMPRESSION: Stable support lines and tubes. Stable bibasilar consolidation. Associated small left pleural effusion. Stable pulmonary insufflation. Electronically Signed   By: Fidela Salisbury MD   On: 11/14/2020 04:32   DG Chest Port 1 View  Result Date: 11/10/2020 CLINICAL DATA:  Respiratory failure EXAM: PORTABLE CHEST 1 VIEW COMPARISON:  10/26/2020  FINDINGS: Endotracheal tube 3.9 cm above the carina. Nasogastric tube extends into the upper abdomen beyond the margin of the examination. Pulmonary insufflation has decreased in the interval. Focal pulmonary infiltrate within the lung bases bilaterally has progressed. Interval development of small left pleural effusion. No pneumothorax. Cardiac size within normal limits. IMPRESSION: Support tubes in appropriate position. Progressive bibasilar pulmonary infiltrate. Interval development of small left pleural effusion. Slight interval decrease in pulmonary insufflation. Electronically Signed   By: Fidela Salisbury MD   On: 11/10/2020 02:32   DG Chest Portable 1 View  Result Date: 11/12/2020 CLINICAL DATA:  post intubation EXAM: PORTABLE CHEST 1 VIEW COMPARISON:  10/23/2020 FINDINGS: In tracheal tube tip overlies the midthoracic trachea. Unchanged cardiomediastinal silhouette with aortic arch calcifications. There are bibasilar opacities, right greater than left. No large pleural effusion or visible pneumothorax. No acute osseous abnormality. IMPRESSION: Endotracheal tube tip overlies the midthoracic trachea. Bibasilar opacities, right greater than left, which could be aspiration/infection. Electronically Signed   By: Maurine Simmering   On: 11/03/2020 13:18   ECHOCARDIOGRAM COMPLETE  Result Date: 11/12/2020    ECHOCARDIOGRAM REPORT   Patient Name:   Jailah CHRISTINE Bostic Date of Exam: 11/12/2020 Medical Rec #:  [id number]              Height:       63.0 in Accession #:    [id number]             Weight:       176.4 lb Date of Birth:  1945/08/23                BSA:          1.833 m Patient Age:    75 years               BP:           100/57 mmHg Patient Gender: F                      HR:           74 bpm. Exam Location:  [facility] Procedure: 2D Echo, Cardiac Doppler and Color Doppler Indications:     CHF-Acute Systolic Y63.78  History:         Patient has prior history of Echocardiogram examinations. Risk                  Factors:Diabetes and Hypertension.  Sonographer:     Alyse Low Roar Referring Phys:  [id number] XAJOIN BRESCIA Diagnosing Phys: Kate Sable MD IMPRESSIONS  1. Left ventricular ejection fraction, by estimation, is 60 to 65%. The left ventricle has normal function. The left ventricle has no regional wall motion abnormalities. There is mild left ventricular hypertrophy. Left ventricular diastolic parameters are consistent with Grade I diastolic dysfunction (impaired relaxation).  2. RVSP is 28mmHg + RA pressure. Right ventricular systolic function is normal. The right ventricular size is normal. Mildly increased right ventricular wall thickness. There is severely elevated pulmonary artery systolic pressure.  3. The mitral valve is grossly normal. No evidence of mitral valve regurgitation.  4. Tricuspid valve regurgitation is mild to moderate.  5. The aortic valve is calcified. Aortic valve regurgitation is trivial. FINDINGS  Left Ventricle: Left ventricular ejection fraction, by estimation, is 60 to 65%. The left ventricle has normal function. The left ventricle has no regional wall motion abnormalities. The left ventricular internal cavity size was normal in size. There is  mild left ventricular  hypertrophy. Left ventricular diastolic parameters are consistent with Grade I diastolic dysfunction (impaired relaxation). Right Ventricle: RVSP is 83mmHg + RA pressure. The right ventricular size is normal. Mildly increased right ventricular wall thickness. Right ventricular systolic function is normal. There is severely elevated pulmonary artery systolic  pressure. Left Atrium: Left atrial size was normal in size. Right Atrium: Right atrial size was normal in size. Pericardium: There is no evidence of pericardial effusion. Mitral Valve: The mitral valve is grossly normal. No evidence of mitral valve regurgitation. Tricuspid Valve: The tricuspid valve is normal in structure. Tricuspid valve regurgitation is mild to moderate. Aortic Valve: The aortic valve is calcified. Aortic valve regurgitation is trivial. Aortic valve peak gradient measures 13.7 mmHg. Pulmonic Valve: The pulmonic valve was not well visualized. Pulmonic valve regurgitation is trivial. Aorta: The aortic root is normal in size and structure. Venous: The inferior vena cava was not well visualized. IAS/Shunts: No atrial level shunt detected by color flow Doppler.  LEFT VENTRICLE PLAX 2D LVIDd:         3.80 cm  Diastology LVIDs:         2.70 cm  LV e' medial:    8.05 cm/s LV PW:         1.00 cm  LV E/e' medial:  8.7 LV IVS:        1.10 cm  LV e' lateral:   7.18 cm/s LVOT diam:     1.60 cm  LV E/e' lateral: 9.7 LVOT Area:     2.01 cm  RIGHT VENTRICLE RV Mid diam:    3.40 cm RV S prime:     13.60 cm/s TAPSE (M-mode): 1.8 cm LEFT ATRIUM             Index       RIGHT ATRIUM           Index LA diam:        3.55 cm 1.94 cm/m  RA Area:     12.40 cm LA Vol (A2C):   43.2 ml 23.57 ml/m RA Volume:   31.50 ml  17.18 ml/m LA Vol (A4C):   46.1 ml 25.15 ml/m LA Biplane Vol: 45.5 ml 24.82 ml/m  AORTIC VALVE                PULMONIC VALVE AV Area (Vmax): 1.62 cm    PV Vmax:        1.14 m/s AV Vmax:        185.00 cm/s PV Peak grad:   5.2 mmHg AV Peak Grad:   13.7 mmHg   RVOT Peak grad: 3 mmHg LVOT Vmax:      149.00 cm/s  AORTA Ao Root diam: 3.65 cm MITRAL VALVE                TRICUSPID VALVE MV Area (PHT): 2.82 cm     TR Peak grad:   62.1 mmHg MV Decel Time: 269 msec     TR Vmax:        394.00 cm/s MV E velocity: 70.00 cm/s MV A velocity: 138.00 cm/s  SHUNTS MV E/A ratio:  0.51         Systemic Diam: 1.60 cm MV A  Prime:    11.6 cm/s Kate Sable MD Electronically signed by Kate Sable MD Signature Date/Time: 11/12/2020/1:45:36 PM    Final    [place] EKG SITE RITE  Result Date: 11/12/2020 If Site Rite image not attached, placement could not be confirmed due to current cardiac  rhythm.   Microbiology No results found for this or any previous visit (from the past 240 hour(s)).  Lab Basic Metabolic Panel: Recent Labs  Lab 11/16/20 1948 11/17/20 0458 11/18/20 0442 11/19/20 0500  NA 139 141 139 139  K 5.0 5.2* 4.1 3.8  CL 102 104 100 98  CO2 31 31 30  34*  GLUCOSE 361* 297* 214* 204*  BUN 38* 40* 40* 52*  CREATININE 0.53 0.56 0.49 0.48  CALCIUM 8.1* 8.4* 8.5* 8.8*  MG 1.8 1.9 2.1 2.0  PHOS  --  2.0* 2.9 2.5   Liver Function Tests: Recent Labs  Lab 11/18/20 0442  ALBUMIN 2.0*   No results for input(s): LIPASE, AMYLASE in the last 168 hours. No results for input(s): AMMONIA in the last 168 hours. CBC: Recent Labs  Lab 11/17/20 0458 11/18/20 0442 11/19/20 0500  WBC 16.0* 21.9* 17.2*  HGB 9.7* 10.2* 8.6*  HCT 30.7* 32.0* 27.4*  MCV 98.4 97.6 97.9  PLT 248 312 272   Cardiac Enzymes: No results for input(s): CKTOTAL, CKMB, CKMBINDEX, TROPONINI in the last 168 hours. Sepsis Labs: Recent Labs  Lab 11/17/20 0458 11/18/20 0442 11/19/20 0500  WBC 16.0* 21.9* 17.2*    Procedures/Operations  none   Ailany Koren 12-19-20, 10:32 AM

## 2020-12-18 NOTE — Care Management Important Message (Signed)
Important Message  Patient Details  Name: [name] MRN: [id number] Date of Birth: August 21, 1945   Medicare Important Message Given:  Other (see comment)  Patient expired.     Dannette Barbara 2020-12-16, 11:55 AM

## 2020-12-18 DEATH — deceased

## 2021-12-19 ENCOUNTER — Telehealth: Payer: Self-pay | Admitting: Cardiology
